# Patient Record
Sex: Male | Born: 1950
Health system: Southern US, Community
[De-identification: ages and names within clinical notes are randomized; demographics above are authoritative.]

## PROBLEM LIST (undated history)

## (undated) DIAGNOSIS — N200 Calculus of kidney: Secondary | ICD-10-CM

## (undated) DIAGNOSIS — G459 Transient cerebral ischemic attack, unspecified: Secondary | ICD-10-CM

## (undated) DIAGNOSIS — E669 Obesity, unspecified: Secondary | ICD-10-CM

## (undated) DIAGNOSIS — R000 Tachycardia, unspecified: Secondary | ICD-10-CM

## (undated) DIAGNOSIS — I10 Essential (primary) hypertension: Secondary | ICD-10-CM

## (undated) DIAGNOSIS — E785 Hyperlipidemia, unspecified: Secondary | ICD-10-CM

## (undated) DIAGNOSIS — E119 Type 2 diabetes mellitus without complications: Secondary | ICD-10-CM

## (undated) DIAGNOSIS — I509 Heart failure, unspecified: Secondary | ICD-10-CM

## (undated) DIAGNOSIS — M1990 Unspecified osteoarthritis, unspecified site: Secondary | ICD-10-CM

## (undated) DIAGNOSIS — J449 Chronic obstructive pulmonary disease, unspecified: Secondary | ICD-10-CM

## (undated) HISTORY — DX: Hyperlipidemia, unspecified: E78.5

## (undated) HISTORY — DX: Obesity, unspecified: E66.9

## (undated) HISTORY — PX: KIDNEY STONE SURGERY: SHX686

## (undated) HISTORY — DX: Type 2 diabetes mellitus without complications: E11.9

## (undated) HISTORY — DX: Transient cerebral ischemic attack, unspecified: G45.9

## (undated) HISTORY — DX: Chronic obstructive pulmonary disease, unspecified: J44.9

## (undated) HISTORY — DX: Heart failure, unspecified: I50.9

---
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
## 2009-03-04 ENCOUNTER — Ambulatory Visit: Payer: Self-pay

## 2010-07-05 ENCOUNTER — Emergency Department: Payer: Self-pay | Admitting: Emergency Medicine

## 2011-11-08 ENCOUNTER — Emergency Department: Payer: Self-pay | Admitting: Emergency Medicine

## 2011-11-11 ENCOUNTER — Inpatient Hospital Stay: Payer: Self-pay | Admitting: Internal Medicine

## 2013-05-12 LAB — BASIC METABOLIC PANEL
BUN: 18 mg/dL (ref 7–18)
Calcium, Total: 9.4 mg/dL (ref 8.5–10.1)
Chloride: 100 mmol/L (ref 98–107)
Co2: 25 mmol/L (ref 21–32)
EGFR (African American): 57 — ABNORMAL LOW
EGFR (Non-African Amer.): 49 — ABNORMAL LOW
Osmolality: 268 (ref 275–301)
Potassium: 3.9 mmol/L (ref 3.5–5.1)
Sodium: 132 mmol/L — ABNORMAL LOW (ref 136–145)

## 2013-05-12 LAB — URINALYSIS, COMPLETE
Glucose,UR: NEGATIVE mg/dL (ref 0–75)
Nitrite: NEGATIVE
Protein: 100
RBC,UR: 134 /HPF (ref 0–5)
Specific Gravity: 1.016 (ref 1.003–1.030)
Squamous Epithelial: 1

## 2013-05-12 LAB — CBC
HCT: 46.5 % (ref 40.0–52.0)
HGB: 16.2 g/dL (ref 13.0–18.0)
MCHC: 34.8 g/dL (ref 32.0–36.0)
MCV: 91 fL (ref 80–100)
Platelet: 229 10*3/uL (ref 150–440)
RDW: 13.8 % (ref 11.5–14.5)
WBC: 24.4 10*3/uL — ABNORMAL HIGH (ref 3.8–10.6)

## 2013-05-13 ENCOUNTER — Inpatient Hospital Stay: Payer: Self-pay | Admitting: Urology

## 2013-05-13 LAB — CBC WITH DIFFERENTIAL/PLATELET
Basophil #: 0.1 10*3/uL (ref 0.0–0.1)
Basophil %: 0.3 %
Eosinophil %: 0.1 %
HCT: 44.2 % (ref 40.0–52.0)
HGB: 14.9 g/dL (ref 13.0–18.0)
Lymphocyte #: 0.7 10*3/uL — ABNORMAL LOW (ref 1.0–3.6)
MCH: 31.4 pg (ref 26.0–34.0)
MCHC: 33.7 g/dL (ref 32.0–36.0)
Monocyte #: 1.3 x10 3/mm — ABNORMAL HIGH (ref 0.2–1.0)
Monocyte %: 6.7 %
Neutrophil #: 17.8 10*3/uL — ABNORMAL HIGH (ref 1.4–6.5)
Platelet: 185 10*3/uL (ref 150–440)
RBC: 4.75 10*6/uL (ref 4.40–5.90)
WBC: 20 10*3/uL — ABNORMAL HIGH (ref 3.8–10.6)

## 2013-05-13 LAB — BASIC METABOLIC PANEL
Anion Gap: 8 (ref 7–16)
BUN: 20 mg/dL — ABNORMAL HIGH (ref 7–18)
Calcium, Total: 8.5 mg/dL (ref 8.5–10.1)
Chloride: 106 mmol/L (ref 98–107)
Co2: 25 mmol/L (ref 21–32)
EGFR (African American): >60
EGFR (Non-African Amer.): 54 — ABNORMAL LOW
Osmolality: 281 (ref 275–301)
Sodium: 139 mmol/L (ref 136–145)

## 2013-05-14 LAB — CBC WITH DIFFERENTIAL/PLATELET
Eosinophil #: 0 10*3/uL (ref 0.0–0.7)
Eosinophil %: 0.1 %
HCT: 39.8 % — ABNORMAL LOW (ref 40.0–52.0)
MCH: 31.6 pg (ref 26.0–34.0)
MCV: 92 fL (ref 80–100)
Monocyte %: 12 %
Neutrophil #: 10.5 10*3/uL — ABNORMAL HIGH (ref 1.4–6.5)
Neutrophil %: 81.8 %
RBC: 4.32 10*6/uL — ABNORMAL LOW (ref 4.40–5.90)
WBC: 12.8 10*3/uL — ABNORMAL HIGH (ref 3.8–10.6)

## 2013-05-14 LAB — BASIC METABOLIC PANEL
BUN: 23 mg/dL — ABNORMAL HIGH (ref 7–18)
Calcium, Total: 8.3 mg/dL — ABNORMAL LOW (ref 8.5–10.1)
Co2: 23 mmol/L (ref 21–32)
Creatinine: 1.56 mg/dL — ABNORMAL HIGH (ref 0.60–1.30)
Glucose: 104 mg/dL — ABNORMAL HIGH (ref 65–99)
Osmolality: 274 (ref 275–301)

## 2013-05-14 LAB — URINE CULTURE

## 2013-05-19 ENCOUNTER — Ambulatory Visit: Payer: Self-pay | Admitting: Urology

## 2013-05-23 LAB — URINE CULTURE

## 2013-05-26 ENCOUNTER — Ambulatory Visit: Payer: Self-pay | Admitting: Urology

## 2013-06-09 DIAGNOSIS — T191XXA Foreign body in bladder, initial encounter: Secondary | ICD-10-CM | POA: Insufficient documentation

## 2013-06-09 DIAGNOSIS — T190XXA Foreign body in urethra, initial encounter: Secondary | ICD-10-CM | POA: Insufficient documentation

## 2013-06-09 DIAGNOSIS — N200 Calculus of kidney: Secondary | ICD-10-CM | POA: Insufficient documentation

## 2013-06-09 DIAGNOSIS — N201 Calculus of ureter: Secondary | ICD-10-CM | POA: Insufficient documentation

## 2013-06-10 ENCOUNTER — Observation Stay: Payer: Self-pay | Admitting: Internal Medicine

## 2013-06-10 DIAGNOSIS — G459 Transient cerebral ischemic attack, unspecified: Secondary | ICD-10-CM

## 2013-06-10 LAB — DRUG SCREEN, URINE
Amphetamines, Ur Screen: NEGATIVE (ref ?–1000)
Benzodiazepine, Ur Scrn: NEGATIVE (ref ?–200)
Cocaine Metabolite,Ur ~~LOC~~: NEGATIVE (ref ?–300)
MDMA (Ecstasy)Ur Screen: NEGATIVE (ref ?–500)
Phencyclidine (PCP) Ur S: NEGATIVE (ref ?–25)
Tricyclic, Ur Screen: NEGATIVE (ref ?–1000)

## 2013-06-10 LAB — COMPREHENSIVE METABOLIC PANEL
Alkaline Phosphatase: 101 U/L (ref 50–136)
BUN: 18 mg/dL (ref 7–18)
Bilirubin,Total: 0.4 mg/dL (ref 0.2–1.0)
Calcium, Total: 9.1 mg/dL (ref 8.5–10.1)
Co2: 27 mmol/L (ref 21–32)
Creatinine: 1.15 mg/dL (ref 0.60–1.30)
EGFR (African American): >60
Glucose: 107 mg/dL — ABNORMAL HIGH (ref 65–99)
Osmolality: 285 (ref 275–301)
Potassium: 3.9 mmol/L (ref 3.5–5.1)
SGPT (ALT): 25 U/L (ref 12–78)
Sodium: 142 mmol/L (ref 136–145)

## 2013-06-10 LAB — URINALYSIS, COMPLETE
Bacteria: NONE SEEN
Ketone: NEGATIVE
Leukocyte Esterase: NEGATIVE
Nitrite: NEGATIVE
Protein: NEGATIVE
Specific Gravity: 1.019 (ref 1.003–1.030)
Squamous Epithelial: 1

## 2013-06-10 LAB — CBC WITH DIFFERENTIAL/PLATELET
Eosinophil #: 0.1 10*3/uL (ref 0.0–0.7)
HGB: 15.4 g/dL (ref 13.0–18.0)
Lymphocyte #: 1.9 10*3/uL (ref 1.0–3.6)
MCH: 31.4 pg (ref 26.0–34.0)
MCHC: 34.3 g/dL (ref 32.0–36.0)
MCV: 92 fL (ref 80–100)
Monocyte #: 0.7 x10 3/mm (ref 0.2–1.0)
Monocyte %: 6.4 %
Neutrophil #: 7.5 10*3/uL — ABNORMAL HIGH (ref 1.4–6.5)
Platelet: 241 10*3/uL (ref 150–440)
WBC: 10.2 10*3/uL (ref 3.8–10.6)

## 2013-06-10 LAB — PROTIME-INR
INR: 1
Prothrombin Time: 13.3 secs (ref 11.5–14.7)

## 2013-06-10 LAB — APTT: Activated PTT: 31.2 secs (ref 23.6–35.9)

## 2013-06-10 LAB — TROPONIN I
Troponin-I: <0.02 ng/mL
Troponin-I: <0.02 ng/mL

## 2013-06-11 LAB — BASIC METABOLIC PANEL
BUN: 18 mg/dL (ref 7–18)
Calcium, Total: 9.1 mg/dL (ref 8.5–10.1)
Chloride: 107 mmol/L (ref 98–107)
Creatinine: 0.87 mg/dL (ref 0.60–1.30)
EGFR (African American): >60
EGFR (Non-African Amer.): >60
Glucose: 97 mg/dL (ref 65–99)
Sodium: 140 mmol/L (ref 136–145)

## 2013-06-11 LAB — CBC WITH DIFFERENTIAL/PLATELET
Basophil %: 1.1 %
Eosinophil #: 0.3 10*3/uL (ref 0.0–0.7)
Eosinophil %: 3.9 %
Lymphocyte %: 33.2 %
MCH: 32 pg (ref 26.0–34.0)
MCV: 92 fL (ref 80–100)
Monocyte %: 8.9 %
Neutrophil #: 4 10*3/uL (ref 1.4–6.5)
Platelet: 212 10*3/uL (ref 150–440)
RBC: 4.57 10*6/uL (ref 4.40–5.90)
RDW: 14.4 % (ref 11.5–14.5)
WBC: 7.6 10*3/uL (ref 3.8–10.6)

## 2013-06-11 LAB — LIPID PANEL
HDL Cholesterol: 40 mg/dL (ref 40–60)
Triglycerides: 135 mg/dL (ref 0–200)
VLDL Cholesterol, Calc: 27 mg/dL (ref 5–40)

## 2013-06-11 LAB — TROPONIN I: Troponin-I: <0.02 ng/mL

## 2013-06-11 LAB — HEMOGLOBIN A1C: Hemoglobin A1C: 6.7 % — ABNORMAL HIGH (ref 4.2–6.3)

## 2013-06-17 ENCOUNTER — Other Ambulatory Visit: Payer: Self-pay | Admitting: Internal Medicine

## 2013-06-17 DIAGNOSIS — R531 Weakness: Secondary | ICD-10-CM

## 2013-06-17 DIAGNOSIS — I639 Cerebral infarction, unspecified: Secondary | ICD-10-CM

## 2013-06-21 ENCOUNTER — Ambulatory Visit
Admission: RE | Admit: 2013-06-21 | Discharge: 2013-06-21 | Disposition: A | Discharge status: Home / Self Care | Payer: BC Managed Care – PPO | Source: Ambulatory Visit | Attending: Internal Medicine | Admitting: Internal Medicine

## 2013-06-21 DIAGNOSIS — I639 Cerebral infarction, unspecified: Secondary | ICD-10-CM

## 2013-06-21 DIAGNOSIS — R531 Weakness: Secondary | ICD-10-CM

## 2013-09-17 ENCOUNTER — Ambulatory Visit: Payer: Self-pay | Admitting: Physician Assistant

## 2015-04-01 NOTE — H&P (Signed)
PATIENT NAME:  Thomas Tanner, Thomas Tanner MR#:  [id number] DATE OF BIRTH:  24-Mar-1951  DATE OF ADMISSION:  06/10/2013  REFERRING PHYSICIAN: Dr. Carollee Massed.   PRIMARY CARE PHYSICIAN: None.   HISTORY OF PRESENT ILLNESS:  The patient is a very nice 64 year old gentleman who has history of obesity and chronic smoking. He has been hospitalized in the past due to cellulitis of the lower extremities; at this moment, he comes with a history of two episodes of facial droop and right-sided weakness. The 2 episodes happened this morning. He woke up with a headache. 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 He was working, it was really hot and he started having some facial droop of the left side with left-sided numbness of the face, right-sided numbness of the right upper extremity that progressed down to the leg.   The episode lasted for 5 minutes. The patient felt really dizzy, lightheaded and had a slight blurry vision for what he went to the break room, and after a while it happened again.  Lasted again for 5 minutes, same symptoms. The patient has not had breakfast at that point, but he does not have breakfast in the mornings; he will usually wait until lunch to eat. He had a kidney stone removed 2 weeks ago, a stent was placed and the stent was recently removed yesterday. The patient states that he has been drinking a normal amount of fluids today. Yesterday, he might of been a little  bit dehydrated due to the procedure and not having anything per mouth.   The patient is admitted for evaluation of these symptoms.   REVIEW OF SYSTEMS:  A 12-system review of systems is done and all negative except for mentioned above.   CONSTITUTIONAL: No changes  in weight. No significant weakness, fatigue or fever prior to this episode.   EYES: No blurry vision prior to today. No double vision. No pain or redness or inflammation.   ENT: No tinnitus. No hearing loss. No difficulty  swallowing. No postnasal drip.   RESPIRATORY: No significant hemoptysis. Positive morning cough from smoking and occasional wheezing when he gets sick, but not on a daily basis.  He denies any history of being diagnosed with chronic obstructive pulmonary disease or having pneumonia.   CARDIOVASCULAR: No chest pain, orthopnea, edema, syncope or prior palpitations.   GASTROINTESTINAL: No nausea, vomiting, abdominal pain, constipation or diarrhea.   GENITOURINARY: No dysuria, hematuria or changes in frequency is kidney stone 2 weeks ago stent place and removed yesterday. No prostatitis.   ENDOCRINE: No polyuria, polydipsia, polyphagia, cold or heat intolerance.   HEMATOLOGIC AND LYMPHATIC: No anemia, easy bruising or swollen glands.   SKIN: No rashes or petechiae at this moment. No new lesions or moles.   NEUROLOGIC: No numbness or tingling at this moment. No previous CVAs or transient ischemic attacks in the past. See history of present illness for more detail.   PSYCHIATRIC: No insomnia, schizophrenia. No nervousness.   PAST MEDICAL HISTORY: 1. Morbid obesity.  2. Current tobacco use.  3. History of previous cellulitis.   PAST SURGICAL HISTORY: The bilateral elbow surgery and kidney stone removal.  He had cystoscopy with stent placement 2 weeks ago.   SOCIAL HISTORY: The patient is a smoker.  He smokes 1 to 1-1/2 packs of cigarettes per day. He has been smoking for over 40 years. He does not drink.  He does not use marijuana. Lives with wife, mother-in-law,  grandmother-in-law and granddaughter.  The patient is married. He works as a Psychologist, educational at [employer].   FAMILY HISTORY: Positive for history of thrombophlebitis in his grandmother, but she states no heart attacks. No strokes.  No diabetes or hypertension runs in his family. No cancer.   MEDICATIONS: Currently takes [facility] powders occasionally for headache.   ALLERGIES: No known drug allergies.   PHYSICAL EXAMINATION: VITAL SIGNS:  Blood pressure of 149/85, pulse 96, respirations 18, temperature of 98.2, heart rate on admission was 106, respirations were 24.   GENERAL: The patient is alert, oriented x 3. No acute distress. No respiratory distress. Hemodynamically stable.   HEENT: Pupils are equal and reactive.  Extraocular movements are intact. Mucosa are moist. Anicteric sclerae. Pink conjunctivae. No oral lesions. No oropharyngeal exudates. Normocephalic, atraumatic.   NECK: Supple. No JVD. No thyromegaly. No adenopathy. No carotid bruits.  Trachea is midline. No significant rigidity.   CARDIOVASCULAR: Regular rate and rhythm. Distant sounds. No murmurs, rubs or gallops. No tenderness to palpation anterior chest wall. No displacement of PMI.   LUNGS: Clear without any wheezing or crepitus. The patient has decreased respiratory sounds in both bases all throughout due to body habitus. No use of accessory muscles.   ABDOMEN: Soft, nontender, nondistended. No hepatosplenomegaly. No masses. Bowel sounds are positive.   SKIN: Without any rashes or petechiae.   MUSCULOSKELETAL: No significant joint swelling or joint deformity.   EXTREMITIES: No edema, cyanosis or clubbing. Pulses +2. Capillary refill less than 3. Actually, the patient may have a little bit of trace edema at the level of the ankles, but no significant pitting edema.   PSYCHIATRIC: The patient is alert, oriented x 3. Normal affect.   LYMPHATICS: Negative for lymphadenopathy in neck or supraclavicular area.   VASCULAR: Pulses +2. Capillary refill less than 3. Sensation is normal distally.   NEUROLOGIC: Cranial nerves II through XII intact. Strength is 5/5 in all four extremities. Cerebellar test overall normal. No significant focal findings.   LABORATORY DATA:  Pretty much nonrevealing. Glucose is 107, sodium 142, potassium 3.9, chloride slightly elevated at 109. LFTs within normal limits. Troponin is negative x 1. White count of 10.2, hemoglobin 15.4,  platelet count 241. Urinalysis; 2 white blood cells, 2 red blood cells and no nitrates and no leukocyte esterase.   EKG: Normal sinus rhythm, nonspecific pressure or elevation, slightly tachycardic.   ASSESSMENT AND PLAN: A 64 year old gentleman obese, comes with symptoms related to a possible transient ischemic attack, possible cerebral vascular accident.  1. Transient ischemic attack versus cerebral vascular accident. At this moment, clinically it does not seem like the patient has a full stroke.  It seems more like a temporary ischemic attack. The patient has resolution of the symptoms. It happened twice today, lasting for five minutes. Again, the patient had full resolution of symptoms for now, we are going to keep him in observation. Neuro checks, bedrest for today. Allow permissive hypertension as the patient has elevation of his blood pressure anyway. Statin and aspirin added on to the management.  Hemoglobin A1c, TSH, and lipid profile add on as well, and we are going to keep him observed under telemetry. Echocardiogram is going to be done to evaluate for any possiblevalvulopathy .  Ultrasound of the carotid arteries is going to be done as the patient to have significant vascular disease, since he is a heavy smoker.  2. Smoking cessation education has been given to the patient for over 5 minutes. The patient states that  he is not quite ready to quit, but he is thinking about it.  3. Hypertension. The patient has elevation of blood pressure on previous hospitalizations and now on this one.  He does admit to the hospital. We are going to monitor closely. By discharge, he should be able to take a blood pressure medication for what we might prescribe 1 hydrochlorothiazide will be a good choice for him.   The patient is a full code.  Gastrointestinal prophylaxis with past deep vein thrombosis prophylaxis with heparin a small dose.  TIME SPENT:  I spent about 45 minutes with this patient.     ____________________________ Felipa Furnaceoberto Sanchez Gutierrez, MD rsg:rw D: 06/10/2013 14:26:17 ET T: 06/10/2013 15:56:05 ET JOB#: [id number]  cc: Felipa Furnaceoberto Sanchez Gutierrez, MD, <Dictator> Bryer Cozzolino Juanda ChanceSANCHEZ GUTIERRE MD ELECTRONICALLY SIGNED 06/20/2013 16:32

## 2015-04-01 NOTE — Discharge Summary (Signed)
PATIENT NAME:  Thomas Tanner, Thomas Tanner MR#:  [id number] DATE OF BIRTH:  23-Mar-1951  DATE OF ADMISSION:  06/10/2013  DATE OF DISCHARGE:  06/11/2013  ADMITTING DIAGNOSIS:  Transient ischemic attack.  DISCHARGE DIAGNOSES:  1.  Recurrent transient ischemic attack, with speech disturbance as well as right-sided weakness, resolved.  2.  Hyperlipidemia, with LDL 133. 3.  Hypertension. 4.  Diabetes mellitus, new onset.  5.  Obesity.  6.  Suspected obstructive sleep apnea as well as ongoing tobacco abuse.   CONDITION:  Stable.   DISCHARGE MEDICATIONS: Simvastatin 10 mg p.o. at bedtime, aspirin 325 mg p.o. once daily, famotidine 10 mg p.o. twice daily, NicoDerm CQ transdermal film 21 mg one patch transdermally once daily. Nicotrol inhaler 10 mg every 2 hours as needed.   HOME OXYGEN: None.   DIET:  2 grams salt, low fat, low cholesterol, carbohydrate-controlled diet, regular consistency.   ACTIVITY LIMITATIONS:  As tolerated.   FOLLOWUP APPOINTMENT: With [facility] in 2 days after discharge. The patient was also advised to get a sleep study done and pulmonary function tests performed as outpatient. He also was advised to follow up with [facility] as outpatient.   HISTORY:  The patient is a 64 year old, obese Caucasian male, who presents to the hospital with complaints of right-sided weakness as well as speech disturbance. Please refer to Dr. Mordecai Maes' detailed admission on 06/10/2013. On arrival to the hospital, he stated that he had a facial droop on the right as well as right-sided weakness, and had some left-sided numbness of the face, as well as right-sided numbness and right upper extremity numbness that progressed down to his leg. The episode lasted approximately 5 minutes. The patient felt dizzy, lightheaded, and had blurry vision. Then the symptoms subsided, and recurred again. On arrival to the Emergency Room, the patient's blood pressure was 149/85, pulse was 96, respiration rate was 18,  temperature was 98.2, and heart rate was 106. Physical examination was unremarkable.  LAB DATA: Done on admission, showed elevated glucose to 107, otherwise BMP was unremarkable. The patient's liver enzymes were normal. Cardiac enzymes x 3 were normal. TSH was normal at 0.92. Urine drug screen was negative for methadone, (Dictation Anomaly) phencyclidine,  tricyclic antidepressants, as well as (Dictation Anomaly) MDMA Otherwise not checked. CBC was within normal limits. Coagulation panel was unremarkable. Urinalysis revealed 2 red blood cells as well as 2 white blood cells. Urine culture showed no growth. EKG showed sinus tachy at 147, otherwise normal EKG. No significant changes were found.   HISTORY: The patient was admitted to the hospital for further evaluation, and stroke/TIA workup was initiated. The patient had radiologic studies done. Chest x-ray done on 06/10/2013 showed no evidence of atelectasis or pneumonia or other acute cardiopulmonary abnormality. The patient had CT scan also on admission of his head without contrast 06/10/2013, which showed no acute intracranial process. Ultrasound of carotid arteries done on 06/11/2013 revealed no evidence of hemodynamically significant stenosis in extracranial carotid arteries. Large nodule in left lobe of thyroid gland incompletely visualized was noted. Dedicated thyroid ultrasound was suggested; however, not performed. Echocardiogram done on 06/11/2013 revealed left ventricular ejection fraction by visual estimation 55% to 60%, normal global left ventricular systolic function, mild concentric left ventricular hypertrophy, mild mitral annular dilatation, mildly increased left ventricular internal cavity size, aortic root dilatation of the aortic root, moderately increased left ventricular posterior wall thickness was also noted.    TIA. It was felt that patient has TIA. His symptoms resolved. However, we were  not able to perform MRI of his brain. The patient  was recommended to continue aspirin therapy. He also had his lipid panel checked, was found to have LDL of 133. He was initiated on a statin. He was recommended to continue a low fat, low cholesterol diet, and lose weight if possible, and follow up his hyperlipidemia profile, targeting his LDL below 100, preferably below 70. In regards to hypertension, initially in the hospital, since patient's blood pressure was mildly elevated, he was not initiated on new medications.   On the day of discharge, his blood pressure was fluctuating between 132 systolic to 152, and diastolic 50s to 80s. We did not initiate blood pressure medications. However, we recommended to follow up patient's blood pressure readings as outpatient, and make decisions about initiation of blood pressure medications if needed.   In regards to hyperglycemia, patient was noted to be hypoglycemic He had a hemoglobin A1c checked, which was found to be 6.7. The patient was consulted by [facility] and patient nurses, and he was recommended also lifestyle outpatient followup. The patient was advised to continue a diabetic diet, and dietitian consultation was also requested. Weight loss was also recommended. We did not initiate any medication at this point, as we felt that patient should try to control his hyperglycemia, his diet as well as lifestyle changes. In regards to obesity, again, we recommended patient to lose weight. We checked his lipid panel and, as mentioned above, patient's LDL was found to be elevated at 133, his triglycerides were 135, and HDL was 40.  Total cholesterol level was 200. The patient was advised to lose weight, as mentioned above, and statin was initiated.   The patient was explained that he likely has obstructive sleep apnea. Sleep study:  (Dictation Anomaly) should be performed as patient to conferm this. Tobacco abuse:  We consulted him about tobacco risks and tobacco replacement therapy was initiated. The patient is being  discharged in stable condition with above-mentioned medications and follow-up. His vital signs on day of discharge: Temperature 97.8, pulse 70s, respiration rate was 18 to 20s, blood pressure 134/60, saturation was 95% to 97% on 2 liters of oxygen per nasal cannula. He remained stable on room air at rest.   TIME SPENT: 40 minutes.    ____________________________ Katharina Caperima Sonali Wivell, MD rv:mr D: 06/12/2013 19:52:50 ET T: 06/12/2013 22:03:39 ET JOB#: [id number]  cc: Katharina Caperima Rayya Yagi, MD, <Dictator> [facility]  Mose Colaizzi MD ELECTRONICALLY SIGNED 06/24/2013 6:57

## 2015-04-01 NOTE — Discharge Summary (Signed)
PATIENT NAME:  [name], Thomas Tanner#:  [id number] DATE OF BIRTH:  Apr 05, 1951  DATE OF ADMISSION:  05/13/2013 DATE OF DISCHARGE:  05/14/2013  PRINCIPAL DIAGNOSES:  1.  Left ureterolithiasis.  2.  Hydronephrosis.  3.  Sepsis.   PROCEDURES: Cystoscopy and left double-J ureteral stent placement.   HOSPITAL COURSE: Tanner. Adah SalvageLassiter presented to the Emergency Room on 05/12/2013 with sudden onset left-sided flank pain. He was found to have a temperature of 102 with significant elevated white blood cell count. He had symptoms consistent with sepsis. He was taken to the operating room early morning of 05/13/2013, at which time he underwent cystoscopy and left  double-J ureteral stent placement. There were no intraoperative problems or complications. His postoperative course was significant for continued spiking fever for approximately 36 hours. His pain was well controlled on oral pain medication. Vital signs were otherwise stable. He does have some baseline COPD, although the patient denies any significant health history. He underwent albuterol nebulizer treatment with improvement in his pulmonary status. He was tolerating a general diet without difficulty. He was also ambulating without difficulty. He maintained good urine output. He was continued on IV antibiotic therapy. His urine cultures demonstrated E. coli. He continued to improve and was discharged on 05/14/2013 on oral Cipro 500 mg for a 10 day course. He was also discharged on Percocet 1 to 2 every 4 to 6 hours as needed for pain. He will be scheduled for left ureteroscopy with holmium laser lithotripsy in the near future. He is to notify us if there are any further problems or questions in the interim.   ____________________________ Madolyn FriezeBrian S. Achilles Dunkope, MD bsc:aw D: 05/15/2013 08:12:50 ET T: 05/15/2013 08:30:03 ET JOB#: [id number]  cc: Madolyn FriezeBrian S. Achilles Dunkope, MD, <Dictator> Madolyn FriezeBRIAN S Dennis Killilea MD ELECTRONICALLY SIGNED 05/26/2013 8:11

## 2015-04-01 NOTE — Op Note (Signed)
PATIENT NAME:  Thomas Tanner, Thomas Tanner MR#:  [id number] DATE OF BIRTH:  19-Oct-1951  DATE OF PROCEDURE:  05/26/2013  PRINCIPAL DIAGNOSIS: Left ureterolithiasis.   POSTOPERATIVE DIAGNOSIS: Left ureterolithiasis.   PROCEDURE PERFORMED: Left ureteroscopy with holmium laser lithotripsy.  Left upper ureteral stent placement.   SURGEON: Edrick Oh.   ANESTHESIA: Laryngeal mask airway anesthesia.   INDICATIONS: The patient is a 64 year old gentleman who presented recently with a 9 mm distal left ureteral calculus with obstruction and hydronephrosis and sepsis. He underwent stent placement. He presents for ureteroscopic stone removal.   DESCRIPTION OF PROCEDURE: After informed consent was obtained, the patient was taken to the Operating Room and placed in the dorsal lithotomy position under laryngeal mask airway anesthesia. The patient was then prepped and draped in the usual standard fashion. The 50 French rigid cystoscope was introduced into the urethra under direct vision with no urethral abnormalities noted. The prostate fossa was noted to be relatively short with moderate bilobar hypertrophy with only partial visual obstruction. Upon entering the bladder, the mucosa was inspected in its entirety. A stent was noted protruding from the left ureteral orifice with mild  inflammatory change surrounding the left ureteral orifice. No other abnormalities were appreciated. The stent was grasped utilizing grasping forceps and removed without difficulty. A guidewire was advanced into the ureteral orifice. Resistance was met at the level of the stone. The cystoscope was removed. The 6 French rigid ureteroscope was advanced into the urinary bladder and was advanced into the left ureteral orifice. A guidewire was then easily advanced alongside of the stone under direct vision into the upper pole collecting system. The guidewire was left in place. The ureteroscope was removed and replaced into the ureter. The holmium laser  fiber was then utilized to fragment the stone into multiple smaller pieces. The stone was noted to be very hard requiring significant energy for fragmentation. The pieces were basket extracted incrementally as the fragmentation occurred. The stone was noted to be adherent to the wall of the ureter with significant inflammation present. Once the bulk of the stone was removed, the scope was advanced into a more proximal portion of the ureter. Several small stone fragments were identified and basket extracted. The guidewire was in proper position. The scope was advanced to the level of the crossing vessels due to the laryngeal mask airway anesthesia. The scope could not be advanced any further. The ureteroscope was removed. The cystoscope was replaced, back loaded over the guidewire. The decision was made to replace the stent due to the degree of stone impaction and inflammation, present. A 6 French x 26 cm double-J ureteral stent was advanced over the guidewire without difficulty into the upper pole collecting system. Adequate curl was noted within the renal pelvis. Adequate curl was also noted within the urinary bladder. The bladder was irrigated free of remaining stone fragments. These were collected and will be sent for stone analysis. The bladder was then drained. The cystoscope was removed. The patient was returned to the supine position and awakened from laryngeal mask airway anesthesia. He was taken to the recovery room in stable condition. There were no problems or complications. The patient tolerated the procedure well.   ____________________________ Denice Bors. Jacqlyn Larsen, MD bsc:rw D: 05/26/2013 16:13:12 ET T: 05/26/2013 17:27:39 ET JOB#: [id number]  cc: Denice Bors. Jacqlyn Larsen, MD, <Dictator> Denice Bors Aleria Maheu MD ELECTRONICALLY SIGNED 05/29/2013 13:22

## 2015-04-01 NOTE — H&P (Signed)
PATIENT NAME:  Thomas Tanner, Thomas Tanner MR#:  [id number] DATE OF BIRTH:  06-17-1951  DATE OF ADMISSION:  05/13/2013  HISTORY: Thomas Tanner is a 64 year old gentleman with a history of nephrolithiasis. He passed a single stone spontaneously approximately 20 years prior. He has noted the onset of left-sided flank pain for several days prior to presentation. He presented to the Emergency Room with worsening left flank pain and discomfort. He was found to have an elevated white blood cell count around 24,000 with a temperature to 101.6. A CT scan was obtained demonstrating a 9 mm distal left ureteral calculus with partial obstruction. The findings are consistent with sepsis. The decision was made to proceed with emergent cystoscopy and stent placement due to the impending sepsis. He denies any other significant prior urological history. He denies any other significant health history except for a previous hospitalization for cellulitis.   PAST MEDICAL HISTORY: Significant for cellulitis and nephrolithiasis as stated above.   PAST SURGICAL HISTORY: Noncontributory.   SOCIAL HISTORY: Noncontributory.   MEDICATIONS ON ADMISSION: None.   ALLERGIES: MORPHINE. This is more of an tolerance. He does not like the side effects of the medication.   PHYSICAL EXAMINATION:  VITAL SIGNS: Temperature 101.6. Vital signs are otherwise stable.  HEENT: Within normal limits.  CHEST: Clear to auscultation bilaterally.  CARDIOVASCULAR: Regular rate and rhythm.  ABDOMEN: Soft, nondistended. Mild-to-moderate tenderness in the left lower quadrant and left flank region. External genitalia within normal limits.  EXTREMITIES: Free range of motion x 4.  NEUROLOGIC: Motor and sensory grossly intact.   ASSESSMENT: Left ureterolithiasis with obstruction and sepsis.  PLAN: The patient will be taken to the operating room on an emergent basis for cystoscopy and stent placement. Risks and benefits have been discussed in detail. Future  treatment will be indicated for stone removal, likely with ureteroscopy and holmium laser lithotripsy. These aspects have been discussed with Thomas Tanner and his wife.   ____________________________ Madolyn FriezeBrian S. Achilles Dunkope, MD bsc:aw Tanner: 05/13/2013 08:20:04 ET T: 05/13/2013 08:50:16 ET JOB#: [id number]  cc: Madolyn FriezeBrian S. Achilles Dunkope, MD, <Dictator> Madolyn FriezeBRIAN S Aydn Ferrara MD ELECTRONICALLY SIGNED 05/13/2013 13:34

## 2015-04-01 NOTE — Op Note (Signed)
PATIENT NAME:  Thomas Tanner, Thomas Tanner MR#:  [id number] DATE OF BIRTH:  23-Jun-1951  DATE OF PROCEDURE:  05/13/2013  PRINCIPAL DIAGNOSES:  1. Left ureterolithiasis.  2. Hydronephrosis.  3. Sepsis.   POSTOPERATIVE DIAGNOSES: 1. Left ureterolithiasis.  2. Hydronephrosis.  3. Sepsis.    PROCEDURE: Cystoscopy, left double-J ureteral stent placement.   SURGEON: Denice Bors. Jacqlyn Larsen, MD   ANESTHESIA: Laryngeal mask airway anesthesia.   INDICATIONS: The patient is a 64 year old gentleman who presented with several-day onset of left-sided flank pain. He was found to have a 9 mm distal left ureteral calculus with obstruction. He had significant elevated white blood cell count and fever consistent with sepsis. He was taken to the operating room for emergent stent placement.   DESCRIPTION OF PROCEDURE: After informed consent was obtained, the patient was taken to the operating room and placed in the dorsal lithotomy position under laryngeal mask airway anesthesia. The patient was then prepped and draped in the usual standard fashion. An attempt was made at passing the 23 French rigid cystoscope. Slight meatal narrowing was encountered. The meatus was dilated to 24 [place] utilizing metal sounds. The 63 French rigid cystoscope was then placed without further difficulty. No other significant urethral abnormalities were appreciated. Upon entering the prostatic fossa, minimal to moderate bilobar hypertrophy was noted with partial visual obstruction. The prostatic fossa was noted to be relatively short. Upon entering the bladder, the mucosa was inspected in its entirety, with no gross mucosal lesions noted. Bilateral ureteral orifices were well visualized, with no lesions noted. A flexible tip Glidewire was introduced into the left ureteral orifice. Multiple attempts were made at passing the guidewire into the upper pole collecting system. Resistance was met at the level of the stone. The guidewire was unable to be passed.  The cystoscope was then removed. The 6 French rigid ureteroscope was advanced into the urinary bladder. It was advanced into the left ureter without difficulty. The stone was visualized just inside of the opening. The tip of the scope was utilized to manipulate the stone slightly to one side. The guidewire was able to be passed along the side of the stone into the upper pole collecting system without further difficulty. The ureteroscope was removed. The cystoscope was replaced, back-loaded over the guidewire. A 6 French x 26 cm double-J ureteral stent was then advanced over the guidewire with slight resistance at the level of the stone. It was advanced into the upper pole collecting system. Adequate curl was noted within the renal pelvis. Adequate curl was also noted within the urinary bladder. Only mild turgid urine drainage was noted. The bladder was drained. The cystoscope was removed. The patient was returned to the supine position and awakened from laryngeal mask airway anesthesia. He was taken to the recovery room in stable condition. There were no problems or complications. The patient tolerated the procedure well.    ____________________________ Denice Bors. Jacqlyn Larsen, MD bsc:OSi D: 05/13/2013 08:17:23 ET T: 05/13/2013 09:34:24 ET JOB#: [id number]  cc: Denice Bors. Jacqlyn Larsen, MD, <Dictator> Denice Bors Plumer Mittelstaedt MD ELECTRONICALLY SIGNED 05/13/2013 13:34

## 2016-02-02 DIAGNOSIS — I10 Essential (primary) hypertension: Secondary | ICD-10-CM | POA: Diagnosis not present

## 2016-02-02 DIAGNOSIS — M1611 Unilateral primary osteoarthritis, right hip: Secondary | ICD-10-CM | POA: Diagnosis not present

## 2016-02-02 DIAGNOSIS — I4892 Unspecified atrial flutter: Secondary | ICD-10-CM | POA: Diagnosis not present

## 2016-02-02 DIAGNOSIS — Z0001 Encounter for general adult medical examination with abnormal findings: Secondary | ICD-10-CM | POA: Diagnosis not present

## 2016-02-02 DIAGNOSIS — R69 Illness, unspecified: Secondary | ICD-10-CM | POA: Diagnosis not present

## 2016-02-02 DIAGNOSIS — I739 Peripheral vascular disease, unspecified: Secondary | ICD-10-CM | POA: Diagnosis not present

## 2016-02-02 DIAGNOSIS — F17210 Nicotine dependence, cigarettes, uncomplicated: Secondary | ICD-10-CM | POA: Diagnosis not present

## 2016-02-08 DIAGNOSIS — I4892 Unspecified atrial flutter: Secondary | ICD-10-CM | POA: Diagnosis not present

## 2016-03-01 DIAGNOSIS — R000 Tachycardia, unspecified: Secondary | ICD-10-CM | POA: Diagnosis not present

## 2016-03-01 DIAGNOSIS — I10 Essential (primary) hypertension: Secondary | ICD-10-CM | POA: Diagnosis not present

## 2016-03-01 DIAGNOSIS — E119 Type 2 diabetes mellitus without complications: Secondary | ICD-10-CM | POA: Diagnosis not present

## 2016-03-01 DIAGNOSIS — N390 Urinary tract infection, site not specified: Secondary | ICD-10-CM | POA: Diagnosis not present

## 2016-12-07 DIAGNOSIS — E119 Type 2 diabetes mellitus without complications: Secondary | ICD-10-CM | POA: Diagnosis not present

## 2016-12-07 DIAGNOSIS — J4520 Mild intermittent asthma, uncomplicated: Secondary | ICD-10-CM | POA: Diagnosis not present

## 2016-12-07 DIAGNOSIS — I10 Essential (primary) hypertension: Secondary | ICD-10-CM | POA: Diagnosis not present

## 2016-12-07 DIAGNOSIS — R69 Illness, unspecified: Secondary | ICD-10-CM | POA: Diagnosis not present

## 2016-12-07 DIAGNOSIS — R000 Tachycardia, unspecified: Secondary | ICD-10-CM | POA: Diagnosis not present

## 2016-12-12 ENCOUNTER — Encounter: Payer: Self-pay | Admitting: Emergency Medicine

## 2016-12-12 DIAGNOSIS — F17200 Nicotine dependence, unspecified, uncomplicated: Secondary | ICD-10-CM | POA: Diagnosis not present

## 2016-12-12 DIAGNOSIS — R040 Epistaxis: Secondary | ICD-10-CM | POA: Diagnosis not present

## 2016-12-12 DIAGNOSIS — I10 Essential (primary) hypertension: Secondary | ICD-10-CM | POA: Diagnosis not present

## 2016-12-12 DIAGNOSIS — R69 Illness, unspecified: Secondary | ICD-10-CM | POA: Diagnosis not present

## 2016-12-12 NOTE — ED Triage Notes (Signed)
Patient states that he has had a nose bleed times two this evening. Bleeding controlled at this time. Patient states that he takes asa daily.

## 2016-12-13 ENCOUNTER — Emergency Department
Admission: EM | Admit: 2016-12-13 | Discharge: 2016-12-13 | Disposition: A | Discharge status: Home / Self Care | Payer: Medicare HMO | Attending: Emergency Medicine | Admitting: Emergency Medicine

## 2016-12-13 DIAGNOSIS — R040 Epistaxis: Secondary | ICD-10-CM

## 2016-12-13 HISTORY — DX: Unspecified osteoarthritis, unspecified site: M19.90

## 2016-12-13 HISTORY — DX: Tachycardia, unspecified: R00.0

## 2016-12-13 HISTORY — DX: Essential (primary) hypertension: I10

## 2016-12-13 HISTORY — DX: Calculus of kidney: N20.0

## 2016-12-13 NOTE — ED Provider Notes (Signed)
[facility] Emergency Department Provider Note    First MD Initiated Contact with Patient 12/13/16 0131     (approximate)  I have reviewed the triage vital signs and the nursing notes.   HISTORY  Chief Complaint Epistaxis    HPI Alandis Bluemel is a 66 y.o. male with bolus of chronic medical conditions presents to the emergency department with a history of nosebleed 2 tonight. Patient states both episodes spontaneously stopped on her own. No active bleeding at this time. Patient does admit to previous nosebleed states usually occurs when the weather is cold outside.   Past Medical History:  Diagnosis Date  . Arthritis   . Hypertension   . Kidney stone   . Tachycardia     There are no active problems to display for this patient.   Past Surgical History:  Procedure Laterality Date  . KIDNEY STONE SURGERY      Prior to Admission medications   Not on File    Allergies Morphine and related  No family history on file.  Social History Social History  Substance Use Topics  . Smoking status: Current Every Day Smoker  . Smokeless tobacco: Never Used  . Alcohol use No    Review of Systems Constitutional: No fever/chills Eyes: No visual changes. ENT: No sore throat.Positive for nosebleed Cardiovascular: Denies chest pain. Respiratory: Denies shortness of breath. Gastrointestinal: No abdominal pain.  No nausea, no vomiting.  No diarrhea.  No constipation. Genitourinary: Negative for dysuria. Musculoskeletal: Negative for back pain. Skin: Negative for rash. Neurological: Negative for headaches, focal weakness or numbness.  10-point ROS otherwise negative.  ____________________________________________   PHYSICAL EXAM:  VITAL SIGNS: ED Triage Vitals [12/12/16 2228]  Enc Vitals Group     BP (!) 158/78     Pulse Rate 73     Resp 18     Temp 98.1 F (36.7 C)     Temp Source Oral     SpO2 95 %     Weight (!) 358 lb  (162.4 kg)     Height 6\' 1"  (1.854 m)     Head Circumference      Peak Flow      Pain Score      Pain Loc      Pain Edu?      Excl. in GC?     Constitutional: Alert and oriented. Well appearing and in no acute distress. Eyes: Conjunctivae are normal. PERRL. EOMI. Head: Atraumatic. Ears:  Healthy appearing ear canals and TMs bilaterally Nose: Evidence of anterior nosebleed, no active bleeding at this time. Mouth/Throat: Mucous membranes are moist.  Oropharynx non-erythematous. Neck: No stridor. Cardiovascular: Normal rate, regular rhythm. Good peripheral circulation. Grossly normal heart sounds.     Procedures     INITIAL IMPRESSION / ASSESSMENT AND PLAN / ED COURSE  Pertinent labs & imaging results that were available during my care of the patient were reviewed by me and considered in my medical decision making (see chart for details).  History of physical exam consistent with an anterior epistaxis. Patient given nasal clamp for home   Clinical Course     ____________________________________________  FINAL CLINICAL IMPRESSION(S) / ED DIAGNOSES  Final diagnoses:  Anterior epistaxis     MEDICATIONS GIVEN DURING THIS VISIT:  Medications - No data to display   NEW OUTPATIENT MEDICATIONS STARTED DURING THIS VISIT:  New Prescriptions   No medications on file    Modified Medications   No medications on file  Discontinued Medications   No medications on file     Note:  This document was prepared using Dragon voice recognition software and may include unintentional dictation errors.    Darci Currentandolph N Brown, MD 12/13/16 [id number]

## 2016-12-13 NOTE — ED Notes (Signed)
Pt given nasal clamp for future nose bleeds

## 2017-03-22 DIAGNOSIS — M1711 Unilateral primary osteoarthritis, right knee: Secondary | ICD-10-CM | POA: Diagnosis not present

## 2017-09-09 ENCOUNTER — Telehealth: Payer: Self-pay

## 2017-09-09 NOTE — Telephone Encounter (Signed)
Entered in error

## 2017-12-02 ENCOUNTER — Ambulatory Visit (INDEPENDENT_AMBULATORY_CARE_PROVIDER_SITE_OTHER): Payer: Managed Care, Other (non HMO) | Admitting: Nurse Practitioner

## 2017-12-02 ENCOUNTER — Encounter: Payer: Self-pay | Admitting: Nurse Practitioner

## 2017-12-02 VITALS — BP 124/69 | HR 99 | Resp 16 | Ht 68.0 in | Wt 365.0 lb

## 2017-12-02 DIAGNOSIS — M5441 Lumbago with sciatica, right side: Secondary | ICD-10-CM

## 2017-12-02 DIAGNOSIS — J4520 Mild intermittent asthma, uncomplicated: Secondary | ICD-10-CM | POA: Diagnosis not present

## 2017-12-02 DIAGNOSIS — M25551 Pain in right hip: Secondary | ICD-10-CM | POA: Diagnosis not present

## 2017-12-02 DIAGNOSIS — Z1211 Encounter for screening for malignant neoplasm of colon: Secondary | ICD-10-CM

## 2017-12-02 DIAGNOSIS — Z0001 Encounter for general adult medical examination with abnormal findings: Secondary | ICD-10-CM

## 2017-12-02 DIAGNOSIS — G8929 Other chronic pain: Secondary | ICD-10-CM | POA: Diagnosis not present

## 2017-12-02 DIAGNOSIS — K219 Gastro-esophageal reflux disease without esophagitis: Secondary | ICD-10-CM

## 2017-12-02 DIAGNOSIS — E119 Type 2 diabetes mellitus without complications: Secondary | ICD-10-CM | POA: Diagnosis not present

## 2017-12-02 DIAGNOSIS — I10 Essential (primary) hypertension: Secondary | ICD-10-CM | POA: Diagnosis not present

## 2017-12-02 LAB — POCT GLYCOSYLATED HEMOGLOBIN (HGB A1C): Hemoglobin A1C: 5.8

## 2017-12-02 MED ORDER — TIZANIDINE HCL 4 MG PO TABS: 4.0000 mg | ORAL_TABLET | Freq: Every day | ORAL | 3 refills | Status: DC

## 2017-12-02 MED ORDER — LISINOPRIL-HYDROCHLOROTHIAZIDE 20-12.5 MG PO TABS: 1.0000 | ORAL_TABLET | Freq: Every day | ORAL | 5 refills | Status: DC

## 2017-12-02 MED ORDER — METOPROLOL SUCCINATE ER 50 MG PO TB24: 50.0000 mg | ORAL_TABLET | Freq: Every day | ORAL | 5 refills | Status: DC

## 2017-12-02 MED ORDER — MELOXICAM 7.5 MG PO TABS: 7.5000 mg | ORAL_TABLET | Freq: Every day | ORAL | 5 refills | Status: DC

## 2017-12-02 MED ORDER — VENTOLIN HFA 108 (90 BASE) MCG/ACT IN AERS
2.0000 | INHALATION_SPRAY | Freq: Four times a day (QID) | RESPIRATORY_TRACT | 5 refills | Status: DC | PRN
Start: 2017-12-02 — End: 2019-04-29

## 2017-12-02 MED ORDER — OMEPRAZOLE 40 MG PO CPDR: 40.0000 mg | DELAYED_RELEASE_CAPSULE | Freq: Every day | ORAL | 5 refills | Status: DC

## 2017-12-02 NOTE — Patient Instructions (Addendum)
Hamstring Strain Rehab Ask your health care provider which exercises are safe for you. Do exercises exactly as told by your health care provider and adjust them as directed. It is normal to feel mild stretching, pulling, tightness, or discomfort as you do these exercises, but you should stop right away if you feel sudden pain or your pain gets worse.Do not begin these exercises until told by your health care provider. Strengthening exercises These exercises build strength and endurance in your thighs. Endurance is the ability to use your muscles for a long time, even after your muscles get tired. Exercise A: Straight leg raises ( hip extensors) 1. Lie on your belly on a firm surface. 2. Tense the muscles in your buttocks to lift your left / right leg about 4 inches (10 cm). Keep your knee straight. 3. If you cannot lift your leg this high without arching your back, place a pillow under your hips. 4. Hold the position for __________ seconds. 5. Slowly lower your leg to the starting position and allow it to relax completely before you start the next repetition. Repeat __________ times. Complete this exercise __________ times a day. Exercise B: Bridge ( hip extensors) 1. Lie on your back on a firm surface with your knees bent and your feet flat on the floor. 2. Tighten your buttocks muscles and lift your bottom off the floor until your trunk is level with your thighs. ? You should feel the muscles working in your buttocks and the back of your thighs. ? Do not arch your back. 3. Hold this position for __________ seconds. 4. Slowly lower your hips to the starting position. 5. Let your buttocks muscles relax completely between repetitions. Repeat __________ times. Complete this exercise __________ times a day. If told by your health care provider, keep your bottom lifted off the floor while you slowly walk your feet away from you as far as you can control. Hold for __________ seconds, then slowly  walk your feet back toward you. Exercise C: Lateral walking with band ( hip abductors) 1. Stand in a long hallway. 2. Wrap a loop of exercise band around your legs, just above your knees. 3. Bend your knees gently and drop your hips down and back so your weight is over your heels. 4. Step to the side to move down the length of the hallway, keeping your toes pointed ahead of you and keeping tension in the band. 5. Repeat, leading with your other leg. Repeat __________ times. Complete this exercise __________ times a day. Exercise D: Single leg stand with reaching ( eccentric hamstring) 1. Stand on your left / right foot. Keep your big toe down on the floor and try to keep your arch lifted. 2. Slowly reach down toward the floor as far as you can while keeping your balance. 3. Hold this position for __________ seconds. Repeat __________ times. Complete this exercise __________ times a day. Exercise E: Prone plank ( abdominals and core) 1. Lie on your belly on the floor and prop yourself up on your elbows. Your hands should be straight out in front of you, and your elbows should be below your shoulders. Position your feet so the balls of your feet touch the ground. The ball of your foot is on the walking surface, right under your toes. 2. Tighten your abdominal muscles and lift your body off the floor. ? Do not arch your back. ? Do not hold your breath. 3. Hold this position for __________ seconds. Repeat __________  times. Complete this exercise __________ times a day. This information is not intended to replace advice given to you by your health care provider. Make sure you discuss any questions you have with your health care provider. Document Released: 11/26/2005 Document Revised: 08/02/2016 Document Reviewed: 08/25/2015 Elsevier Interactive Patient Education  2018 [employer]  Hamstring Strain Rehab Ask your health care provider which exercises are safe for you. Do exercises exactly as  told by your health care provider and adjust them as directed. It is normal to feel mild stretching, pulling, tightness, or discomfort as you do these exercises, but you should stop right away if you feel sudden pain or your pain gets worse.Do not begin these exercises until told by your health care provider. Strengthening exercises These exercises build strength and endurance in your thighs. Endurance is the ability to use your muscles for a long time, even after your muscles get tired. Exercise A: Straight leg raises ( hip extensors) 6. Lie on your belly on a firm surface. 7. Tense the muscles in your buttocks to lift your left / right leg about 4 inches (10 cm). Keep your knee straight. 8. If you cannot lift your leg this high without arching your back, place a pillow under your hips. 9. Hold the position for __________ seconds. 10. Slowly lower your leg to the starting position and allow it to relax completely before you start the next repetition. Repeat __________ times. Complete this exercise __________ times a day. Exercise B: Bridge ( hip extensors) 6. Lie on your back on a firm surface with your knees bent and your feet flat on the floor. 7. Tighten your buttocks muscles and lift your bottom off the floor until your trunk is level with your thighs. ? You should feel the muscles working in your buttocks and the back of your thighs. ? Do not arch your back. 8. Hold this position for __________ seconds. 9. Slowly lower your hips to the starting position. 10. Let your buttocks muscles relax completely between repetitions. Repeat __________ times. Complete this exercise __________ times a day. If told by your health care provider, keep your bottom lifted off the floor while you slowly walk your feet away from you as far as you can control. Hold for __________ seconds, then slowly walk your feet back toward you. Exercise C: Lateral walking with band ( hip abductors) 6. Stand in a long  hallway. 7. Wrap a loop of exercise band around your legs, just above your knees. 8. Bend your knees gently and drop your hips down and back so your weight is over your heels. 9. Step to the side to move down the length of the hallway, keeping your toes pointed ahead of you and keeping tension in the band. 10. Repeat, leading with your other leg. Repeat __________ times. Complete this exercise __________ times a day. Exercise D: Single leg stand with reaching ( eccentric hamstring) 4. Stand on your left / right foot. Keep your big toe down on the floor and try to keep your arch lifted. 5. Slowly reach down toward the floor as far as you can while keeping your balance. 6. Hold this position for __________ seconds. Repeat __________ times. Complete this exercise __________ times a day. Exercise E: Prone plank ( abdominals and core) 4. Lie on your belly on the floor and prop yourself up on your elbows. Your hands should be straight out in front of you, and your elbows should be below your shoulders. Position your feet  so the balls of your feet touch the ground. The ball of your foot is on the walking surface, right under your toes. 5. Tighten your abdominal muscles and lift your body off the floor. ? Do not arch your back. ? Do not hold your breath. 6. Hold this position for __________ seconds. Repeat __________ times. Complete this exercise __________ times a day. This information is not intended to replace advice given to you by your health care provider. Make sure you discuss any questions you have with your health care provider. Document Released: 11/26/2005 Document Revised: 08/02/2016 Document Reviewed: 08/25/2015 Elsevier Interactive Patient Education  [employer]2018 [employer]vier Inc.

## 2017-12-02 NOTE — Progress Notes (Signed)
Subjective:     Patient ID: Thomas Tanner, male   DOB: 11/19/1951, 66 y.o.   MRN: [id number]  The patient ishere for annual wellness visit today. He c/o weight gain of 13 pounds since most recent visit. Did have a few deaths of thos who are close to him. Used food as comfort. Has not been exercising. Knows this has contributing to his weight gain Also c/o lower back pain which raadiates into the right hip and around the thigh and lower leg. Will, three days out of seven, have pain which is so severe, he can hardly stand on his right lower leg. Does take meloxicam twice daily, which does take the edge off of the pain some.  Does have goodcontrol of his blood sugars without taking medications at all. We will check HgbA1c and get routine labs after today's visit . The patient does use a walker to help with balance and mobility. Has had the same one for several years and is falling into disrepair. He is in need of new walker. An order will be sent to home health provider today.      Current Outpatient Medications:  .  aspirin EC 81 MG tablet, Take 81 mg by mouth daily., Disp: , Rfl:  .  glucose blood test strip, 1 each by Other route as needed for other. Use as instructed, Disp: , Rfl:  .  lisinopril-hydrochlorothiazide (PRINZIDE,ZESTORETIC) 20-12.5 MG tablet, , Disp: , Rfl:  .  meloxicam (MOBIC) 7.5 MG tablet, , Disp: , Rfl:  .  metoprolol succinate (TOPROL-XL) 50 MG 24 hr tablet, , Disp: , Rfl:  .  omeprazole (PRILOSEC) 40 MG capsule, , Disp: , Rfl:  .  ONETOUCH DELICA LANCETS 33G MISC, by Does not apply route., Disp: , Rfl:  .  triamcinolone cream (KENALOG) 0.5 %, Apply 1 application topically 2 (two) times daily., Disp: , Rfl:  .  VENTOLIN HFA 108 (90 Base) MCG/ACT inhaler, , Disp: , Rfl:     Review of Systems  Constitutional: Positive for fatigue.       Weight gain of 13 pounds since most recent visit.   HENT: Positive for postnasal drip.   Eyes: Positive for redness and itching.   Respiratory: Positive for shortness of breath.   Cardiovascular: Negative for chest pain and palpitations.  Gastrointestinal: Negative.   Endocrine:       Controls blood sugars through diet alone.   Genitourinary: Negative.   Musculoskeletal: Positive for arthralgias and myalgias.       Pain in right aspect of lower back, radiating into right hip and upper leg, making standing for long periods of time and exercise, very difficult.   Skin: Negative.   Allergic/Immunologic: Positive for environmental allergies.  Neurological: Negative.   Hematological: Negative.   Psychiatric/Behavioral:       Feeling some depressed over the past year due to the loss of several friends/family.    Today's Vitals   12/02/17 0835  BP: 124/69  Pulse: 99  Resp: 16  SpO2: 96%  Weight: (!) 365 lb (165.6 kg)  Height: 5\' 8"  (1.727 m)      Objective:   Physical Exam  Constitutional: He is oriented to person, place, and time. He appears well-developed and well-nourished.  Morbid obesity  HENT:  Head: Normocephalic.  Eyes: Pupils are equal, round, and reactive to light.  Neck: Normal range of motion. Neck supple. No thyromegaly present.  Cardiovascular: Normal rate and regular rhythm.  Pulmonary/Chest: Effort normal and breath sounds  normal.  Abdominal: Soft.  Musculoskeletal:       Back:       Legs: Neurological: He is alert and oriented to person, place, and time.  Skin: Skin is warm and dry.  Psychiatric: He has a normal mood and affect.  Nursing note and vitals reviewed.      Assessment:     Encounter for routine adult physical exam with abnormal findings - Plan: PSA, CBC with Differential/Platelet, Comprehensive metabolic panel, TSH  Diabetes mellitus without complication (HCC) - Plan: POCT HgB A1C, Comprehensive metabolic panel, Lipid panel, lisinopril-hydrochlorothiazide (PRINZIDE,ZESTORETIC) 20-12.5 MG tablet  Hypertension, unspecified type - Plan: Lipid panel,  lisinopril-hydrochlorothiazide (PRINZIDE,ZESTORETIC) 20-12.5 MG tablet, metoprolol succinate (TOPROL-XL) 50 MG 24 hr tablet  Morbid obesity (HCC) - Plan: TSH  Chronic right-sided low back pain with right-sided sciatica - Plan: DG Lumbar Spine Complete, meloxicam (MOBIC) 7.5 MG tablet, tiZANidine (ZANAFLEX) 4 MG tablet  Pain in right hip - Plan: DG HIP UNILAT WITH PELVIS MIN 4 VIEWS RIGHT, meloxicam (MOBIC) 7.5 MG tablet, tiZANidine (ZANAFLEX) 4 MG tablet  Screening for colon cancer - Plan: Fecal occult blood, imunochemical  Mild intermittent asthma, unspecified whether complicated - Plan: VENTOLIN HFA 108 (90 Base) MCG/ACT inhaler  Gastroesophageal reflux disease without esophagitis - Plan: omeprazole (PRILOSEC) 40 MG capsule     Plan:      1. Annual wellness visit today 2. HgbA1c 5.8 today. Controlled through diet. Discussed how weight loss would help to manage DM and recommended he follow ADA diet.  3. htn well controlled. Continue bp meds as prescribed  4. Morbid obesity - discussed following low-calorie, ADA diet. recommnded he slowly increase very low0impact exercise to help him lower weight.  5. Right low back pain with right sciatica - meloxicam 7.5mg  twice daily. X-ray lumbar spine and right hip. Zanaflex 4mg  at bedtime if needed to help relieve tight and sore muscles.  8. Pain in right hip - x-ray right hip. meloxicam twice daily - order for new walker to help with balance and mobility will be given to home health today 9. FOBT ordered 10 continue to use rescue inhaler as needed and as prescribed. Strongly encouraged smoking cessation. 11. Continue omeprazole as prescribed   Follow up 4 months and sooner if needed

## 2017-12-05 ENCOUNTER — Other Ambulatory Visit: Payer: Self-pay | Admitting: Internal Medicine

## 2017-12-05 DIAGNOSIS — G8929 Other chronic pain: Secondary | ICD-10-CM

## 2017-12-05 DIAGNOSIS — M25551 Pain in right hip: Secondary | ICD-10-CM

## 2017-12-05 DIAGNOSIS — M5441 Lumbago with sciatica, right side: Principal | ICD-10-CM

## 2018-01-08 ENCOUNTER — Other Ambulatory Visit: Payer: Self-pay

## 2018-01-30 ENCOUNTER — Other Ambulatory Visit: Payer: Self-pay | Admitting: Nurse Practitioner

## 2018-01-30 DIAGNOSIS — G8929 Other chronic pain: Secondary | ICD-10-CM

## 2018-01-30 DIAGNOSIS — M25551 Pain in right hip: Secondary | ICD-10-CM

## 2018-01-30 DIAGNOSIS — M5441 Lumbago with sciatica, right side: Principal | ICD-10-CM

## 2018-01-30 MED ORDER — CYCLOBENZAPRINE HCL 10 MG PO TABS: 10.0000 mg | ORAL_TABLET | Freq: Two times a day (BID) | ORAL | 2 refills | Status: DC | PRN

## 2018-01-30 NOTE — Progress Notes (Signed)
Changed tizanidine to flexeril 10mg  - this may be taken up to twice daily if needed. May cause significant drowsiness so should be careful taking it.

## 2018-02-13 ENCOUNTER — Encounter: Payer: Self-pay | Admitting: Nurse Practitioner

## 2018-02-13 ENCOUNTER — Ambulatory Visit (INDEPENDENT_AMBULATORY_CARE_PROVIDER_SITE_OTHER): Payer: Managed Care, Other (non HMO) | Admitting: Nurse Practitioner

## 2018-02-13 VITALS — BP 163/90 | HR 82 | Resp 16 | Ht 73.0 in | Wt 369.0 lb

## 2018-02-13 DIAGNOSIS — G8929 Other chronic pain: Secondary | ICD-10-CM

## 2018-02-13 DIAGNOSIS — M5441 Lumbago with sciatica, right side: Secondary | ICD-10-CM | POA: Diagnosis not present

## 2018-02-13 DIAGNOSIS — I10 Essential (primary) hypertension: Secondary | ICD-10-CM | POA: Diagnosis not present

## 2018-02-13 DIAGNOSIS — M25551 Pain in right hip: Secondary | ICD-10-CM

## 2018-02-13 MED ORDER — TRAMADOL HCL 50 MG PO TABS: 50.0000 mg | ORAL_TABLET | Freq: Two times a day (BID) | ORAL | 2 refills | Status: DC | PRN

## 2018-02-13 MED ORDER — CYCLOBENZAPRINE HCL 10 MG PO TABS: 10.0000 mg | ORAL_TABLET | Freq: Two times a day (BID) | ORAL | 2 refills | Status: DC | PRN

## 2018-02-13 MED ORDER — MELOXICAM 7.5 MG PO TABS: 7.5000 mg | ORAL_TABLET | Freq: Two times a day (BID) | ORAL | 5 refills | Status: DC

## 2018-02-13 NOTE — Progress Notes (Signed)
[facility] [address] [place], [place] [address]  Internal MEDICINE  Office Visit Note  Patient Name: Thomas Tanner  [id number]  [id number]  Date of Service: 03/05/2018  Chief Complaint  Patient presents with  . Sciatica    right leg     The patient is here for sick visit. Today, he is complaining of low back pain with sciatica. Will get what feels like a softball on the right hip/butcheek. Hurts terribly. Was given rx for tizanidine in rcent visit. States that this did nothing for the pain. He also states that his meloxicam suddenly dropped in dosage from 2 times daily to once daily. Has really noticed a difference    Pt is here for a sick visit.     Current Medication:  Outpatient Encounter Medications as of 02/13/2018  Medication Sig  . aspirin EC 81 MG tablet Take 81 mg by mouth daily.  . cyclobenzaprine (FLEXERIL) 10 MG tablet Take 1 tablet (10 mg total) by mouth 2 (two) times daily as needed for muscle spasms.  Marland Kitchen glucose blood test strip 1 each by Other route as needed for other. Use as instructed  . lisinopril-hydrochlorothiazide (PRINZIDE,ZESTORETIC) 20-12.5 MG tablet Take 1 tablet by mouth daily.  . meloxicam (MOBIC) 7.5 MG tablet Take 1 tablet (7.5 mg total) by mouth 2 (two) times daily.  . metoprolol succinate (TOPROL-XL) 50 MG 24 hr tablet Take 1 tablet (50 mg total) by mouth daily.  Marland Kitchen omeprazole (PRILOSEC) 40 MG capsule Take 1 capsule (40 mg total) by mouth daily.  Letta Pate DELICA LANCETS 33G MISC by Does not apply route.  . traMADol (ULTRAM) 50 MG tablet Take 1 tablet (50 mg total) by mouth every 12 (twelve) hours as needed.  . triamcinolone cream (KENALOG) 0.5 % Apply 1 application topically 2 (two) times daily.  . VENTOLIN HFA 108 (90 Base) MCG/ACT inhaler Inhale 2 puffs into the lungs every 6 (six) hours as needed for wheezing or shortness of breath.  . [DISCONTINUED] cyclobenzaprine (FLEXERIL) 10 MG tablet Take 1 tablet (10 mg  total) by mouth 2 (two) times daily as needed for muscle spasms.  . [DISCONTINUED] meloxicam (MOBIC) 7.5 MG tablet Take 1 tablet (7.5 mg total) by mouth daily.   No facility-administered encounter medications on file as of 02/13/2018.       Medical History: Past Medical History:  Diagnosis Date  . Arthritis   . COPD (chronic obstructive pulmonary disease) (HCC)   . Diabetes (HCC)   . Hyperlipidemia   . Hypertension   . Kidney stone   . Obesity   . Tachycardia   . TIA (transient ischemic attack)      Vital Signs: Today's Vitals   02/13/18 1147  BP: (!) 163/90  Pulse: 82  Resp: 16  SpO2: 96%  Weight: (!) 369 lb (167.4 kg)  Height: 6\' 1"  (1.854 m)    Review of Systems  Constitutional: Positive for fatigue.       Weight gain of 13 pounds since most recent visit.   HENT: Negative for postnasal drip.   Eyes: Negative for redness and itching.  Respiratory: Positive for shortness of breath.   Cardiovascular: Negative for chest pain and palpitations.  Gastrointestinal: Negative for constipation, diarrhea, nausea and vomiting.  Endocrine:       Controls blood sugars through diet alone.   Genitourinary: Negative.   Musculoskeletal: Positive for arthralgias, back pain and myalgias.       Pain in right aspect of lower  back, radiating into right hip and upper leg, making standing for long periods of time and exercise, very difficult.   Skin: Negative for rash.  Allergic/Immunologic: Positive for environmental allergies.  Neurological: Positive for headaches.  Hematological: Negative.   Psychiatric/Behavioral:       Feeling some depressed over the past year due to the loss of several friends/family.     Physical Exam  Constitutional: He is oriented to person, place, and time. He appears well-developed and well-nourished.  Morbid obesity  HENT:  Head: Normocephalic and atraumatic.  Eyes: Pupils are equal, round, and reactive to light.  Neck: Normal range of motion. Neck  supple. No thyromegaly present.  Cardiovascular: Normal rate, regular rhythm and normal heart sounds.  Pulmonary/Chest: Effort normal and breath sounds normal. No respiratory distress. He has no wheezes.  Abdominal: Soft. Bowel sounds are normal. There is no tenderness.  Musculoskeletal:       Back:       Legs: Neurological: He is alert and oriented to person, place, and time.  Skin: Skin is warm and dry.  Psychiatric: He has a normal mood and affect. His behavior is normal. Judgment and thought content normal.  Nursing note and vitals reviewed.  Assessment/Plan: 1. Low back pain with right-sided sciatica, unspecified back pain laterality, unspecified chronicity - traMADol (ULTRAM) 50 MG tablet; Take 1 tablet (50 mg total) by mouth every 12 (twelve) hours as needed.  Dispense: 45 tablet; Refill: 2  2. Chronic right-sided low back pain with right-sided sciatica - cyclobenzaprine (FLEXERIL) 10 MG tablet; Take 1 tablet (10 mg total) by mouth 2 (two) times daily as needed for muscle spasms.  Dispense: 45 tablet; Refill: 2 - meloxicam (MOBIC) 7.5 MG tablet; Take 1 tablet (7.5 mg total) by mouth 2 (two) times daily.  Dispense: 60 tablet; Refill: 5  3. Pain in right hip - meloxicam (MOBIC) 7.5 MG tablet; Take 1 tablet (7.5 mg total) by mouth 2 (two) times daily.  Dispense: 60 tablet; Refill: 5  4. Essential hypertension Elevated today due to pain. Continue bp medication as prescribed. Reassess at next visit.   General Counseling: [name] verbalizes understanding of the findings of todays visit and agrees with plan of treatment. I have discussed any further diagnostic evaluation that may be needed or ordered today. We also reviewed his medications today. he has been encouraged to call the office with any questions or concerns that should arise related to todays visit.   Meds ordered this encounter  Medications  . cyclobenzaprine (FLEXERIL) 10 MG tablet    Sig: Take 1 tablet (10 mg total) by  mouth 2 (two) times daily as needed for muscle spasms.    Dispense:  45 tablet    Refill:  2    Order Specific Question:   Supervising Provider    Answer:   Lyndon CodeKHAN, FOZIA M [[id number]]  . meloxicam (MOBIC) 7.5 MG tablet    Sig: Take 1 tablet (7.5 mg total) by mouth 2 (two) times daily.    Dispense:  60 tablet    Refill:  5    Patient may take this twice daily    Order Specific Question:   Supervising Provider    Answer:   Lyndon CodeKHAN, FOZIA M [[id number]]  . traMADol (ULTRAM) 50 MG tablet    Sig: Take 1 tablet (50 mg total) by mouth every 12 (twelve) hours as needed.    Dispense:  45 tablet    Refill:  2    Order Specific Question:  Supervising Provider    Answer:   Lyndon Code [[id number]]    Time spent: 15 Minutes

## 2018-03-05 DIAGNOSIS — M25551 Pain in right hip: Secondary | ICD-10-CM | POA: Insufficient documentation

## 2018-03-05 DIAGNOSIS — M5441 Lumbago with sciatica, right side: Secondary | ICD-10-CM | POA: Insufficient documentation

## 2018-03-05 DIAGNOSIS — I10 Essential (primary) hypertension: Secondary | ICD-10-CM | POA: Insufficient documentation

## 2018-03-05 DIAGNOSIS — G8929 Other chronic pain: Secondary | ICD-10-CM | POA: Insufficient documentation

## 2018-04-03 ENCOUNTER — Encounter: Payer: Self-pay | Admitting: Nurse Practitioner

## 2018-04-03 ENCOUNTER — Ambulatory Visit (INDEPENDENT_AMBULATORY_CARE_PROVIDER_SITE_OTHER): Payer: Managed Care, Other (non HMO) | Admitting: Nurse Practitioner

## 2018-04-03 VITALS — BP 133/83 | HR 96 | Resp 16 | Ht 73.0 in | Wt 373.4 lb

## 2018-04-03 DIAGNOSIS — M5441 Lumbago with sciatica, right side: Secondary | ICD-10-CM

## 2018-04-03 DIAGNOSIS — I10 Essential (primary) hypertension: Secondary | ICD-10-CM | POA: Diagnosis not present

## 2018-04-03 DIAGNOSIS — R7301 Impaired fasting glucose: Secondary | ICD-10-CM | POA: Diagnosis not present

## 2018-04-03 DIAGNOSIS — G8929 Other chronic pain: Secondary | ICD-10-CM | POA: Diagnosis not present

## 2018-04-03 DIAGNOSIS — M25551 Pain in right hip: Secondary | ICD-10-CM

## 2018-04-03 LAB — POCT GLYCOSYLATED HEMOGLOBIN (HGB A1C): Hemoglobin A1C: 5.8

## 2018-04-03 MED ORDER — METOPROLOL SUCCINATE ER 50 MG PO TB24: 50.0000 mg | ORAL_TABLET | Freq: Every day | ORAL | 5 refills | Status: DC

## 2018-04-03 MED ORDER — MELOXICAM 7.5 MG PO TABS: 7.5000 mg | ORAL_TABLET | Freq: Two times a day (BID) | ORAL | 5 refills | Status: DC

## 2018-04-03 MED ORDER — LISINOPRIL-HYDROCHLOROTHIAZIDE 20-12.5 MG PO TABS: 1.0000 | ORAL_TABLET | Freq: Every day | ORAL | 5 refills | Status: DC

## 2018-04-03 NOTE — Progress Notes (Signed)
[facility] [address] [place], [place] [id number]  Internal MEDICINE  Office Visit Note  Patient Name: Thomas BodilyRichard Douglas Tanner  [id number]  [id number]  Date of Service: 04/03/2018  Chief Complaint  Patient presents with  . Diabetes    check HgbA1c   . Back Pain    with sciatica - follow up. improved.     The patient was seen for low back pain and severe sciatica at his last visit. Was given short term prescriptions for tramadol and cyclobenzaprine to treat the pain. He took very few of these medications, but is feeling, he states, 99% better. Will get an occasional twinge at times. Gradually starting to walk again, gradually increasing his distance. Plans to restart going to the gym next week.   Diabetes  He presents for his follow-up diabetic visit. He has type 2 diabetes mellitus. No MedicAlert identification noted. His disease course has been stable. There are no hypoglycemic associated symptoms. Pertinent negatives for hypoglycemia include no dizziness or headaches. There are no diabetic associated symptoms. Pertinent negatives for diabetes include no chest pain and no weakness. There are no hypoglycemic complications. Symptoms are stable. Diabetic complications include heart disease. Risk factors for coronary artery disease include dyslipidemia, hypertension, male sex and sedentary lifestyle. Current diabetic treatment includes diet. He is compliant with treatment most of the time. His weight is stable. He is following a generally healthy diet. He participates in exercise intermittently. There is no change in his home blood glucose trend. An ACE inhibitor/angiotensin II receptor blocker is being taken. He does not see a podiatrist.Eye exam is not current.  Back Pain  Pertinent negatives include no chest pain, headaches or weakness.    Pt is here for routine follow up.    Current Medication: Outpatient Encounter Medications as of 04/03/2018  Medication Sig  . aspirin  EC 81 MG tablet Take 81 mg by mouth daily.  . cyclobenzaprine (FLEXERIL) 10 MG tablet Take 1 tablet (10 mg total) by mouth 2 (two) times daily as needed for muscle spasms.  Marland Kitchen. glucose blood test strip 1 each by Other route as needed for other. Use as instructed  . lisinopril-hydrochlorothiazide (PRINZIDE,ZESTORETIC) 20-12.5 MG tablet Take 1 tablet by mouth daily.  . meloxicam (MOBIC) 7.5 MG tablet Take 1 tablet (7.5 mg total) by mouth 2 (two) times daily.  . metoprolol succinate (TOPROL-XL) 50 MG 24 hr tablet Take 1 tablet (50 mg total) by mouth daily.  Marland Kitchen. omeprazole (PRILOSEC) 40 MG capsule Take 1 capsule (40 mg total) by mouth daily.  Letta Pate. ONETOUCH DELICA LANCETS 33G MISC by Does not apply route.  . traMADol (ULTRAM) 50 MG tablet Take 1 tablet (50 mg total) by mouth every 12 (twelve) hours as needed.  . triamcinolone cream (KENALOG) 0.5 % Apply 1 application topically 2 (two) times daily.  . VENTOLIN HFA 108 (90 Base) MCG/ACT inhaler Inhale 2 puffs into the lungs every 6 (six) hours as needed for wheezing or shortness of breath.  . [DISCONTINUED] lisinopril-hydrochlorothiazide (PRINZIDE,ZESTORETIC) 20-12.5 MG tablet Take 1 tablet by mouth daily.  . [DISCONTINUED] meloxicam (MOBIC) 7.5 MG tablet Take 1 tablet (7.5 mg total) by mouth 2 (two) times daily.  . [DISCONTINUED] metoprolol succinate (TOPROL-XL) 50 MG 24 hr tablet Take 1 tablet (50 mg total) by mouth daily.   No facility-administered encounter medications on file as of 04/03/2018.     Surgical History: Past Surgical History:  Procedure Laterality Date  . KIDNEY STONE SURGERY    . left  arm surgery  1963    Medical History: Past Medical History:  Diagnosis Date  . Arthritis   . COPD (chronic obstructive pulmonary disease) (HCC)   . Diabetes (HCC)   . Hyperlipidemia   . Hypertension   . Kidney stone   . Obesity   . Tachycardia   . TIA (transient ischemic attack)     Family History: Family History  Family history unknown: Yes      Social History   Socioeconomic History  . Marital status: Married    Spouse name: Not on file  . Number of children: Not on file  . Years of education: Not on file  . Highest education level: Not on file  Occupational History  . Not on file  Social Needs  . Financial resource strain: Not on file  . Food insecurity:    Worry: Not on file    Inability: Not on file  . Transportation needs:    Medical: Not on file    Non-medical: Not on file  Tobacco Use  . Smoking status: Current Every Day Smoker    Types: Cigarettes  . Smokeless tobacco: Never Used  Substance and Sexual Activity  . Alcohol use: No  . Drug use: No  . Sexual activity: Not on file  Lifestyle  . Physical activity:    Days per week: Not on file    Minutes per session: Not on file  . Stress: Not on file  Relationships  . Social connections:    Talks on phone: Not on file    Gets together: Not on file    Attends religious service: Not on file    Active member of club or organization: Not on file    Attends meetings of clubs or organizations: Not on file    Relationship status: Not on file  . Intimate partner violence:    Fear of current or ex partner: Not on file    Emotionally abused: Not on file    Physically abused: Not on file    Forced sexual activity: Not on file  Other Topics Concern  . Not on file  Social History Narrative  . Not on file      Review of Systems  Constitutional: Negative for activity change, chills and unexpected weight change.       Weight gain of 13 pounds since most recent visit.   HENT: Positive for postnasal drip. Negative for congestion, rhinorrhea, sinus pressure and sore throat.   Eyes: Negative.  Negative for redness and itching.  Respiratory: Positive for shortness of breath.   Cardiovascular: Negative for chest pain.  Gastrointestinal: Negative for constipation, diarrhea, nausea and vomiting.  Endocrine:       Controls blood sugars through diet alone.    Genitourinary: Negative.   Musculoskeletal: Positive for arthralgias, back pain and myalgias.       Improved lower back pain and sciatica  Skin: Negative for rash.  Allergic/Immunologic: Positive for environmental allergies.  Neurological: Negative for dizziness, weakness and headaches.  Hematological: Negative.   Psychiatric/Behavioral:       Feeling some depressed over the past year due to the loss of several friends/family.    Today's Vitals   04/03/18 0842  BP: 133/83  Pulse: 96  Resp: 16  SpO2: 96%  Weight: (!) 373 lb 6.4 oz (169.4 kg)  Height: 6\' 1"  (1.854 m)     Physical Exam  Constitutional: He is oriented to person, place, and time. He appears well-developed and well-nourished.  Morbid obesity  HENT:  Head: Normocephalic and atraumatic.  Eyes: Pupils are equal, round, and reactive to light. Conjunctivae and EOM are normal.  Neck: Normal range of motion. Neck supple. Carotid bruit is not present. No thyromegaly present.  Cardiovascular: Normal rate, regular rhythm and normal heart sounds.  Mild tachycardia  Pulmonary/Chest: Effort normal and breath sounds normal. No respiratory distress. He has no wheezes.  Congested, non-productive cough  Abdominal: Soft. Bowel sounds are normal. There is no tenderness.  Musculoskeletal: Normal range of motion.       Back:       Legs: Pain located in lower back and in right hip and upper leg has improved significantly since his last visit. ROM and strength have improved.   Neurological: He is alert and oriented to person, place, and time.  Skin: Skin is warm and dry.  Psychiatric: He has a normal mood and affect. His behavior is normal. Judgment and thought content normal.  Nursing note and vitals reviewed.  Assessment/Plan:  1. Impaired fasting glucose - POCT HgB A1C 5.8 today. Continue to control blood sugar through diet. Gradually increase regular exercise as tolerated.   2. Hypertension, unspecified type Stable.  Continue bp medication as prescribed.  - lisinopril-hydrochlorothiazide (PRINZIDE,ZESTORETIC) 20-12.5 MG tablet; Take 1 tablet by mouth daily.  Dispense: 30 tablet; Refill: 5 - metoprolol succinate (TOPROL-XL) 50 MG 24 hr tablet; Take 1 tablet (50 mg total) by mouth daily.  Dispense: 30 tablet; Refill: 5  3. Chronic right-sided low back pain with right-sided sciatica Improved. Will continue meloxicam twice daily as needed.  - meloxicam (MOBIC) 7.5 MG tablet; Take 1 tablet (7.5 mg total) by mouth 2 (two) times daily.  Dispense: 60 tablet; Refill: 5  4. Pain in right hip Improved.  - meloxicam (MOBIC) 7.5 MG tablet; Take 1 tablet (7.5 mg total) by mouth 2 (two) times daily.  Dispense: 60 tablet; Refill: 5   General Counseling: Yousof verbalizes understanding of the findings of todays visit and agrees with plan of treatment. I have discussed any further diagnostic evaluation that may be needed or ordered today. We also reviewed his medications today. he has been encouraged to call the office with any questions or concerns that should arise related to todays visit.   Hypertension Counseling:   The following hypertensive lifestyle modification were recommended and discussed:  1. Limiting alcohol intake to less than 1 oz/day of ethanol:(24 oz of beer or 8 oz of wine or 2 oz of 100-proof whiskey). 2. Take baby ASA 81 mg daily. 3. Importance of regular aerobic exercise and losing weight. 4. Reduce dietary saturated fat and cholesterol intake for overall cardiovascular health. 5. Maintaining adequate dietary potassium, calcium, and magnesium intake. 6. Regular monitoring of the blood pressure. 7. Reduce sodium intake to less than 100 mmol/day (less than 2.3 gm of sodium or less than 6 gm of sodium choride)   This patient was seen by Vincent Gros, FNP- C in Collaboration with Dr Lyndon Code as a part of collaborative care agreement    Orders Placed This Encounter  Procedures  . POCT HgB A1C     Meds ordered this encounter  Medications  . lisinopril-hydrochlorothiazide (PRINZIDE,ZESTORETIC) 20-12.5 MG tablet    Sig: Take 1 tablet by mouth daily.    Dispense:  30 tablet    Refill:  5    Order Specific Question:   Supervising Provider    Answer:   Lyndon Code [[id number]]  . meloxicam (MOBIC) 7.5 MG  tablet    Sig: Take 1 tablet (7.5 mg total) by mouth 2 (two) times daily.    Dispense:  60 tablet    Refill:  5    Patient may take this twice daily    Order Specific Question:   Supervising Provider    Answer:   Lyndon Code [[id number]]  . metoprolol succinate (TOPROL-XL) 50 MG 24 hr tablet    Sig: Take 1 tablet (50 mg total) by mouth daily.    Dispense:  30 tablet    Refill:  5    Order Specific Question:   Supervising Provider    Answer:   Lyndon Code [[id number]]    Time spent: 90  Minutes     Dr Lyndon Code Internal medicine

## 2018-07-21 DIAGNOSIS — J309 Allergic rhinitis, unspecified: Secondary | ICD-10-CM | POA: Diagnosis not present

## 2018-07-21 DIAGNOSIS — Z6841 Body Mass Index (BMI) 40.0 and over, adult: Secondary | ICD-10-CM | POA: Diagnosis not present

## 2018-07-21 DIAGNOSIS — G629 Polyneuropathy, unspecified: Secondary | ICD-10-CM | POA: Diagnosis not present

## 2018-07-21 DIAGNOSIS — I10 Essential (primary) hypertension: Secondary | ICD-10-CM | POA: Diagnosis not present

## 2018-07-21 DIAGNOSIS — K08109 Complete loss of teeth, unspecified cause, unspecified class: Secondary | ICD-10-CM | POA: Diagnosis not present

## 2018-07-21 DIAGNOSIS — G8929 Other chronic pain: Secondary | ICD-10-CM | POA: Diagnosis not present

## 2018-07-21 DIAGNOSIS — R69 Illness, unspecified: Secondary | ICD-10-CM | POA: Diagnosis not present

## 2018-07-21 DIAGNOSIS — J449 Chronic obstructive pulmonary disease, unspecified: Secondary | ICD-10-CM | POA: Diagnosis not present

## 2018-07-21 DIAGNOSIS — I739 Peripheral vascular disease, unspecified: Secondary | ICD-10-CM | POA: Diagnosis not present

## 2018-08-04 ENCOUNTER — Ambulatory Visit (INDEPENDENT_AMBULATORY_CARE_PROVIDER_SITE_OTHER): Payer: Managed Care, Other (non HMO) | Admitting: Nurse Practitioner

## 2018-08-04 ENCOUNTER — Telehealth: Payer: Self-pay

## 2018-08-04 ENCOUNTER — Encounter: Payer: Self-pay | Admitting: Nurse Practitioner

## 2018-08-04 VITALS — BP 147/81 | HR 76 | Resp 16 | Ht 73.0 in | Wt 386.0 lb

## 2018-08-04 DIAGNOSIS — I10 Essential (primary) hypertension: Secondary | ICD-10-CM

## 2018-08-04 DIAGNOSIS — M25551 Pain in right hip: Secondary | ICD-10-CM

## 2018-08-04 DIAGNOSIS — J4520 Mild intermittent asthma, uncomplicated: Secondary | ICD-10-CM | POA: Insufficient documentation

## 2018-08-04 DIAGNOSIS — R7301 Impaired fasting glucose: Secondary | ICD-10-CM

## 2018-08-04 DIAGNOSIS — M25561 Pain in right knee: Secondary | ICD-10-CM

## 2018-08-04 DIAGNOSIS — G8929 Other chronic pain: Secondary | ICD-10-CM | POA: Diagnosis not present

## 2018-08-04 LAB — POCT GLYCOSYLATED HEMOGLOBIN (HGB A1C): HEMOGLOBIN A1C: 5.8 % — AB (ref 4.0–5.6)

## 2018-08-04 NOTE — Progress Notes (Signed)
[facility] [address] [place], [place] [address]  Internal MEDICINE  Office Visit Note  Patient Name: Thomas Tanner  [id number]  [id number]  Date of Service: 08/04/2018  Chief Complaint  Patient presents with  . Hypertension  . Osteoarthritis    right knee and left shoulder.     The patient is c/o left shoulder pain. Happened while he was working out at [employer] Recently started going back to the gym, waling on the treadmill. Continues to have intermittent pain in right knee. Does take meloxicam. States that he cannot really tell when he takes it, but has more pain when he does not take this.   Hypertension  This is a chronic problem. The current episode started more than 1 year ago. The problem is unchanged. The problem is resistant. Associated symptoms include shortness of breath. Pertinent negatives include no chest pain or headaches. Agents associated with hypertension include NSAIDs. Risk factors for coronary artery disease include dyslipidemia, male gender, obesity and sedentary lifestyle. Past treatments include ACE inhibitors and beta blockers. The current treatment provides moderate improvement. Compliance problems include exercise.        Current Medication: Outpatient Encounter Medications as of 08/04/2018  Medication Sig  . aspirin EC 81 MG tablet Take 81 mg by mouth daily.  . cyclobenzaprine (FLEXERIL) 10 MG tablet Take 1 tablet (10 mg total) by mouth 2 (two) times daily as needed for muscle spasms.  Marland Kitchen glucose blood test strip 1 each by Other route as needed for other. Use as instructed  . Krill Oil (OMEGA-3) 500 MG CAPS Take by mouth. Take 1 tab po daily  . lisinopril-hydrochlorothiazide (PRINZIDE,ZESTORETIC) 20-12.5 MG tablet Take 1 tablet by mouth daily.  . meloxicam (MOBIC) 7.5 MG tablet Take 1 tablet (7.5 mg total) by mouth 2 (two) times daily.  . metoprolol succinate (TOPROL-XL) 50 MG 24 hr tablet Take 1 tablet (50 mg total) by mouth  daily.  Marland Kitchen omeprazole (PRILOSEC) 40 MG capsule Take 1 capsule (40 mg total) by mouth daily.  Letta Pate DELICA LANCETS 33G MISC by Does not apply route.  . traMADol (ULTRAM) 50 MG tablet Take 1 tablet (50 mg total) by mouth every 12 (twelve) hours as needed.  . triamcinolone cream (KENALOG) 0.5 % Apply 1 application topically 2 (two) times daily.  . VENTOLIN HFA 108 (90 Base) MCG/ACT inhaler Inhale 2 puffs into the lungs every 6 (six) hours as needed for wheezing or shortness of breath.   No facility-administered encounter medications on file as of 08/04/2018.     Surgical History: Past Surgical History:  Procedure Laterality Date  . KIDNEY STONE SURGERY    . left arm surgery  1963    Medical History: Past Medical History:  Diagnosis Date  . Arthritis   . COPD (chronic obstructive pulmonary disease) (HCC)   . Diabetes (HCC)   . Hyperlipidemia   . Hypertension   . Kidney stone   . Obesity   . Tachycardia   . TIA (transient ischemic attack)     Family History: Family History  Family history unknown: Yes    Social History   Socioeconomic History  . Marital status: Married    Spouse name: Not on file  . Number of children: Not on file  . Years of education: Not on file  . Highest education level: Not on file  Occupational History  . Not on file  Social Needs  . Financial resource strain: Not on file  . Food  insecurity:    Worry: Not on file    Inability: Not on file  . Transportation needs:    Medical: Not on file    Non-medical: Not on file  Tobacco Use  . Smoking status: Current Every Day Smoker    Types: Cigarettes  . Smokeless tobacco: Never Used  Substance and Sexual Activity  . Alcohol use: No  . Drug use: No  . Sexual activity: Not on file  Lifestyle  . Physical activity:    Days per week: Not on file    Minutes per session: Not on file  . Stress: Not on file  Relationships  . Social connections:    Talks on phone: Not on file    Gets together:  Not on file    Attends religious service: Not on file    Active member of club or organization: Not on file    Attends meetings of clubs or organizations: Not on file    Relationship status: Not on file  . Intimate partner violence:    Fear of current or ex partner: Not on file    Emotionally abused: Not on file    Physically abused: Not on file    Forced sexual activity: Not on file  Other Topics Concern  . Not on file  Social History Narrative  . Not on file      Review of Systems  Constitutional: Negative for activity change, chills and unexpected weight change.       Weight gain of 13 pounds since most recent visit.   HENT: Negative for congestion, postnasal drip, rhinorrhea, sinus pressure and sore throat.   Eyes: Negative.  Negative for redness and itching.  Respiratory: Positive for shortness of breath.        Has noted a rattle in his chest and some wheezing since the weather has changed   Cardiovascular: Negative for chest pain.       Blood pressure elevated today.   Gastrointestinal: Negative for constipation, diarrhea, nausea and vomiting.  Endocrine:       Controls blood sugars through diet alone.   Genitourinary: Negative.   Musculoskeletal: Positive for arthralgias, back pain and myalgias.       Improved lower back pain and sciatica  Skin: Negative for rash.  Allergic/Immunologic: Positive for environmental allergies.  Neurological: Negative for dizziness, weakness and headaches.  Hematological: Negative for adenopathy.  Psychiatric/Behavioral: Negative for dysphoric mood. The patient is not nervous/anxious.        Feeling some depressed over the past year due to the loss of several friends/family.     Today's Vitals   08/04/18 0838  BP: (!) 147/81  Pulse: 76  Resp: 16  SpO2: 94%  Weight: (!) 386 lb (175.1 kg)  Height: 6\' 1"  (1.854 m)    Physical Exam  Constitutional: He is oriented to person, place, and time. He appears well-developed and  well-nourished. No distress.  HENT:  Head: Normocephalic and atraumatic.  Nose: Nose normal.  Mouth/Throat: Oropharynx is clear and moist. No oropharyngeal exudate.  Eyes: Pupils are equal, round, and reactive to light. Conjunctivae and EOM are normal.  Neck: Normal range of motion. Neck supple. No JVD present. Carotid bruit is not present. No tracheal deviation present. No thyromegaly present.  Cardiovascular: Normal rate, regular rhythm and normal heart sounds. Exam reveals no gallop and no friction rub.  No murmur heard. Pulmonary/Chest: Effort normal and breath sounds normal. No respiratory distress. He has no wheezes. He has no rales.  He exhibits no tenderness.  Abdominal: Soft. Bowel sounds are normal. There is tenderness.  Musculoskeletal: Normal range of motion.  Mild left shoulder tenderness. Worse with internal and external rotation. ROM and strength are intact.  Right knee and right hip are very tender. Pain in right knee is generally about 9/10 in severity at all times. Sometimes.this pain is more in the right hip. Makes walking without walker or cane very difficult. No visible or palpable abnormalities noted at this time.   Lymphadenopathy:    He has no cervical adenopathy.  Neurological: He is alert and oriented to person, place, and time. No cranial nerve deficit.  Skin: Skin is warm and dry. He is not diaphoretic.  Psychiatric: He has a normal mood and affect. His behavior is normal. Judgment and thought content normal.  Nursing note and vitals reviewed.  Assessment/Plan: 1. Hypertension, unspecified type Generally stable. Continue bp medication as prescribed   2. Impaired fasting glucose - POCT HgB A1C 5.8 today. Continue to control through diet and exercise.  3. Chronic pain of right knee Continue meloxicam twice daily when needed. Will consider getting x-rays next at next visit if still severe.   4. Pain in right hip Meloxicam 7.5mg  twice daily if needed for  pain/inflammation. Will get x-ray right hip at next visit if still bothersome at next visit.   5. Mild intermittent asthma, unspecified whether complicated Continue to use inhaler as needed and as prescribed .  General Counseling: Philipe verbalizes understanding of the findings of todays visit and agrees with plan of treatment. I have discussed any further diagnostic evaluation that may be needed or ordered today. We also reviewed his medications today. he has been encouraged to call the office with any questions or concerns that should arise related to todays visit.  Hypertension Counseling:   The following hypertensive lifestyle modification were recommended and discussed:  1. Limiting alcohol intake to less than 1 oz/day of ethanol:(24 oz of beer or 8 oz of wine or 2 oz of 100-proof whiskey). 2. Take baby ASA 81 mg daily. 3. Importance of regular aerobic exercise and losing weight. 4. Reduce dietary saturated fat and cholesterol intake for overall cardiovascular health. 5. Maintaining adequate dietary potassium, calcium, and magnesium intake. 6. Regular monitoring of the blood pressure. 7. Reduce sodium intake to less than 100 mmol/day (less than 2.3 gm of sodium or less than 6 gm of sodium choride)   This patient was seen by Vincent Gros FNP Collaboration with Dr Lyndon Code as a part of collaborative care agreement   Orders Placed This Encounter  Procedures  . POCT HgB A1C      Time spent: 15 Minutes      Dr Lyndon Code Internal medicine

## 2018-08-04 NOTE — Telephone Encounter (Signed)
Faxed out completed Healthcare Quality Patient Assessment Form with signed note attached to [employer]

## 2018-08-21 ENCOUNTER — Other Ambulatory Visit: Payer: Self-pay

## 2018-08-21 DIAGNOSIS — K219 Gastro-esophageal reflux disease without esophagitis: Secondary | ICD-10-CM

## 2018-08-21 MED ORDER — OMEPRAZOLE 40 MG PO CPDR: 40.0000 mg | DELAYED_RELEASE_CAPSULE | Freq: Every day | ORAL | 5 refills | Status: DC

## 2018-11-27 ENCOUNTER — Ambulatory Visit: Payer: Self-pay | Admitting: Nurse Practitioner

## 2018-12-08 ENCOUNTER — Encounter: Payer: Self-pay | Admitting: Internal Medicine

## 2018-12-08 ENCOUNTER — Ambulatory Visit: Payer: Self-pay | Admitting: Nurse Practitioner

## 2018-12-08 ENCOUNTER — Inpatient Hospital Stay
Admission: EM | Admit: 2018-12-08 | Discharge: 2018-12-10 | DRG: 247 | Disposition: A | Discharge status: Home / Self Care | Payer: Medicare HMO | Attending: Family Medicine | Admitting: Family Medicine

## 2018-12-08 ENCOUNTER — Emergency Department: Payer: Medicare HMO

## 2018-12-08 ENCOUNTER — Encounter
Admission: EM | Disposition: A | Discharge status: Home / Self Care | Payer: Self-pay | Source: Home / Self Care | Attending: Internal Medicine

## 2018-12-08 ENCOUNTER — Other Ambulatory Visit: Payer: Self-pay

## 2018-12-08 DIAGNOSIS — K219 Gastro-esophageal reflux disease without esophagitis: Secondary | ICD-10-CM | POA: Diagnosis present

## 2018-12-08 DIAGNOSIS — E119 Type 2 diabetes mellitus without complications: Secondary | ICD-10-CM | POA: Diagnosis present

## 2018-12-08 DIAGNOSIS — Z87442 Personal history of urinary calculi: Secondary | ICD-10-CM | POA: Diagnosis not present

## 2018-12-08 DIAGNOSIS — Z79899 Other long term (current) drug therapy: Secondary | ICD-10-CM

## 2018-12-08 DIAGNOSIS — Z885 Allergy status to narcotic agent status: Secondary | ICD-10-CM | POA: Diagnosis not present

## 2018-12-08 DIAGNOSIS — J449 Chronic obstructive pulmonary disease, unspecified: Secondary | ICD-10-CM

## 2018-12-08 DIAGNOSIS — I10 Essential (primary) hypertension: Secondary | ICD-10-CM | POA: Diagnosis present

## 2018-12-08 DIAGNOSIS — I2119 ST elevation (STEMI) myocardial infarction involving other coronary artery of inferior wall: Secondary | ICD-10-CM | POA: Diagnosis not present

## 2018-12-08 DIAGNOSIS — R000 Tachycardia, unspecified: Secondary | ICD-10-CM | POA: Diagnosis present

## 2018-12-08 DIAGNOSIS — Z8673 Personal history of transient ischemic attack (TIA), and cerebral infarction without residual deficits: Secondary | ICD-10-CM | POA: Diagnosis not present

## 2018-12-08 DIAGNOSIS — Z6841 Body Mass Index (BMI) 40.0 and over, adult: Secondary | ICD-10-CM

## 2018-12-08 DIAGNOSIS — M25559 Pain in unspecified hip: Secondary | ICD-10-CM | POA: Diagnosis present

## 2018-12-08 DIAGNOSIS — I213 ST elevation (STEMI) myocardial infarction of unspecified site: Secondary | ICD-10-CM | POA: Diagnosis present

## 2018-12-08 DIAGNOSIS — J9801 Acute bronchospasm: Secondary | ICD-10-CM | POA: Diagnosis present

## 2018-12-08 DIAGNOSIS — R600 Localized edema: Secondary | ICD-10-CM | POA: Diagnosis present

## 2018-12-08 DIAGNOSIS — Z7982 Long term (current) use of aspirin: Secondary | ICD-10-CM

## 2018-12-08 DIAGNOSIS — R0902 Hypoxemia: Secondary | ICD-10-CM | POA: Diagnosis not present

## 2018-12-08 DIAGNOSIS — M1610 Unilateral primary osteoarthritis, unspecified hip: Secondary | ICD-10-CM | POA: Diagnosis present

## 2018-12-08 DIAGNOSIS — M1990 Unspecified osteoarthritis, unspecified site: Secondary | ICD-10-CM | POA: Diagnosis present

## 2018-12-08 DIAGNOSIS — G8929 Other chronic pain: Secondary | ICD-10-CM | POA: Diagnosis present

## 2018-12-08 DIAGNOSIS — E785 Hyperlipidemia, unspecified: Secondary | ICD-10-CM | POA: Diagnosis present

## 2018-12-08 DIAGNOSIS — I2510 Atherosclerotic heart disease of native coronary artery without angina pectoris: Secondary | ICD-10-CM | POA: Diagnosis present

## 2018-12-08 DIAGNOSIS — Z9889 Other specified postprocedural states: Secondary | ICD-10-CM | POA: Diagnosis not present

## 2018-12-08 DIAGNOSIS — R079 Chest pain, unspecified: Secondary | ICD-10-CM | POA: Diagnosis not present

## 2018-12-08 DIAGNOSIS — M545 Low back pain: Secondary | ICD-10-CM | POA: Diagnosis present

## 2018-12-08 DIAGNOSIS — R0789 Other chest pain: Secondary | ICD-10-CM | POA: Diagnosis not present

## 2018-12-08 DIAGNOSIS — I2102 ST elevation (STEMI) myocardial infarction involving left anterior descending coronary artery: Secondary | ICD-10-CM | POA: Diagnosis not present

## 2018-12-08 DIAGNOSIS — F17210 Nicotine dependence, cigarettes, uncomplicated: Secondary | ICD-10-CM | POA: Diagnosis present

## 2018-12-08 DIAGNOSIS — E8881 Metabolic syndrome: Secondary | ICD-10-CM | POA: Diagnosis present

## 2018-12-08 DIAGNOSIS — R69 Illness, unspecified: Secondary | ICD-10-CM | POA: Diagnosis not present

## 2018-12-08 DIAGNOSIS — I214 Non-ST elevation (NSTEMI) myocardial infarction: Secondary | ICD-10-CM | POA: Diagnosis not present

## 2018-12-08 HISTORY — PX: LEFT HEART CATH AND CORONARY ANGIOGRAPHY: CATH118249

## 2018-12-08 HISTORY — PX: CORONARY/GRAFT ACUTE MI REVASCULARIZATION: CATH118305

## 2018-12-08 LAB — LIPID PANEL
Cholesterol: 163 mg/dL (ref 0–200)
HDL: 34 mg/dL — ABNORMAL LOW (ref >40)
LDL Cholesterol: 105 mg/dL — ABNORMAL HIGH (ref 0–99)
Total CHOL/HDL Ratio: 4.8 RATIO
Triglycerides: 121 mg/dL (ref <150)
VLDL: 24 mg/dL (ref 0–40)

## 2018-12-08 LAB — HEMOGLOBIN A1C
Hgb A1c MFr Bld: 5.4 % (ref 4.8–5.6)
Mean Plasma Glucose: 108.28 mg/dL

## 2018-12-08 LAB — CBC WITH DIFFERENTIAL/PLATELET
Abs Immature Granulocytes: 0.04 10*3/uL (ref 0.00–0.07)
Basophils Absolute: 0.1 10*3/uL (ref 0.0–0.1)
Basophils Relative: 1 %
EOS ABS: 0.1 10*3/uL (ref 0.0–0.5)
EOS PCT: 1 %
HCT: 52.4 % — ABNORMAL HIGH (ref 39.0–52.0)
Hemoglobin: 17 g/dL (ref 13.0–17.0)
Immature Granulocytes: 0 %
Lymphocytes Relative: 16 %
Lymphs Abs: 1.6 10*3/uL (ref 0.7–4.0)
MCH: 30.9 pg (ref 26.0–34.0)
MCHC: 32.4 g/dL (ref 30.0–36.0)
MCV: 95.3 fL (ref 80.0–100.0)
Monocytes Absolute: 0.7 10*3/uL (ref 0.1–1.0)
Monocytes Relative: 7 %
Neutro Abs: 8 10*3/uL — ABNORMAL HIGH (ref 1.7–7.7)
Neutrophils Relative %: 75 %
Platelets: 251 10*3/uL (ref 150–400)
RBC: 5.5 MIL/uL (ref 4.22–5.81)
RDW: 13.3 % (ref 11.5–15.5)
WBC: 10.5 10*3/uL (ref 4.0–10.5)
nRBC: 0 % (ref 0.0–0.2)

## 2018-12-08 LAB — COMPREHENSIVE METABOLIC PANEL
ALT: 15 U/L (ref 0–44)
AST: 37 U/L (ref 15–41)
Albumin: 3.7 g/dL (ref 3.5–5.0)
Alkaline Phosphatase: 120 U/L (ref 38–126)
Anion gap: 10 (ref 5–15)
BUN: 22 mg/dL (ref 8–23)
CALCIUM: 9.1 mg/dL (ref 8.9–10.3)
CO2: 26 mmol/L (ref 22–32)
Chloride: 98 mmol/L (ref 98–111)
Creatinine, Ser: 1.23 mg/dL (ref 0.61–1.24)
GFR calc Af Amer: >60 mL/min (ref >60)
Glucose, Bld: 142 mg/dL — ABNORMAL HIGH (ref 70–99)
Potassium: 4.2 mmol/L (ref 3.5–5.1)
Sodium: 134 mmol/L — ABNORMAL LOW (ref 135–145)
Total Bilirubin: 0.7 mg/dL (ref 0.3–1.2)
Total Protein: 7.1 g/dL (ref 6.5–8.1)

## 2018-12-08 LAB — APTT: aPTT: 24 seconds (ref 24–36)

## 2018-12-08 LAB — GLUCOSE, CAPILLARY
GLUCOSE-CAPILLARY: 136 mg/dL — AB (ref 70–99)
Glucose-Capillary: 152 mg/dL — ABNORMAL HIGH (ref 70–99)
Glucose-Capillary: 84 mg/dL (ref 70–99)
Glucose-Capillary: 106 mg/dL — ABNORMAL HIGH (ref 70–99)

## 2018-12-08 LAB — TROPONIN I
Troponin I: 0.23 ng/mL (ref <0.03)
Troponin I: 4.85 ng/mL (ref <0.03)
Troponin I: 18.04 ng/mL (ref <0.03)
Troponin I: 15.44 ng/mL (ref <0.03)

## 2018-12-08 LAB — TSH: TSH: 0.708 u[IU]/mL (ref 0.350–4.500)

## 2018-12-08 LAB — BRAIN NATRIURETIC PEPTIDE: B Natriuretic Peptide: 41 pg/mL (ref 0.0–100.0)

## 2018-12-08 LAB — POCT ACTIVATED CLOTTING TIME: ACTIVATED CLOTTING TIME: 356 s

## 2018-12-08 LAB — MRSA PCR SCREENING: MRSA by PCR: NEGATIVE

## 2018-12-08 SURGERY — CORONARY/GRAFT ACUTE MI REVASCULARIZATION
Anesthesia: Moderate Sedation

## 2018-12-08 MED ORDER — METOPROLOL SUCCINATE ER 50 MG PO TB24: 100.0000 mg | ORAL_TABLET | Freq: Every day | ORAL | Status: DC

## 2018-12-08 MED ORDER — ENOXAPARIN SODIUM 40 MG/0.4ML ~~LOC~~ SOLN
40.0000 mg | Freq: Two times a day (BID) | SUBCUTANEOUS | Status: DC
  Administered 2018-12-08 – 2018-12-10 (×5): 40 mg via SUBCUTANEOUS
  Filled 2018-12-08 (×5): qty 0.4

## 2018-12-08 MED ORDER — HYDRALAZINE HCL 20 MG/ML IJ SOLN: 5.0000 mg | INTRAMUSCULAR | Status: AC | PRN

## 2018-12-08 MED ORDER — ENOXAPARIN SODIUM 40 MG/0.4ML ~~LOC~~ SOLN: 40.0000 mg | SUBCUTANEOUS | Status: DC

## 2018-12-08 MED ORDER — TICAGRELOR 90 MG PO TABS
ORAL_TABLET | ORAL | Status: AC
  Filled 2018-12-08: qty 2

## 2018-12-08 MED ORDER — LISINOPRIL 10 MG PO TABS
10.0000 mg | ORAL_TABLET | Freq: Two times a day (BID) | ORAL | Status: DC
  Administered 2018-12-08 – 2018-12-10 (×3): 10 mg via ORAL
  Filled 2018-12-08 (×2): qty 2
  Filled 2018-12-08: qty 1
  Filled 2018-12-08: qty 2

## 2018-12-08 MED ORDER — LABETALOL HCL 5 MG/ML IV SOLN: 10.0000 mg | INTRAVENOUS | Status: AC | PRN

## 2018-12-08 MED ORDER — SODIUM CHLORIDE 0.9 % IV SOLN
INTRAVENOUS | Status: DC | PRN
  Administered 2018-12-08: 1.75 mg/kg/h via INTRAVENOUS

## 2018-12-08 MED ORDER — ATORVASTATIN CALCIUM 20 MG PO TABS
80.0000 mg | ORAL_TABLET | Freq: Every day | ORAL | Status: DC
  Administered 2018-12-08 – 2018-12-09 (×2): 80 mg via ORAL
  Filled 2018-12-08 (×2): qty 4

## 2018-12-08 MED ORDER — SODIUM CHLORIDE 0.9 % WEIGHT BASED INFUSION
1.0000 mL/kg/h | INTRAVENOUS | Status: AC
  Administered 2018-12-08: 1 mL/kg/h via INTRAVENOUS

## 2018-12-08 MED ORDER — FLUTICASONE FUROATE-VILANTEROL 100-25 MCG/INH IN AEPB
1.0000 | INHALATION_SPRAY | Freq: Every day | RESPIRATORY_TRACT | Status: DC
  Administered 2018-12-08 – 2018-12-10 (×3): 1 via RESPIRATORY_TRACT
  Filled 2018-12-08 (×2): qty 28

## 2018-12-08 MED ORDER — TIOTROPIUM BROMIDE MONOHYDRATE 18 MCG IN CAPS
18.0000 ug | ORAL_CAPSULE | Freq: Every day | RESPIRATORY_TRACT | Status: DC
  Administered 2018-12-08 – 2018-12-10 (×3): 18 ug via RESPIRATORY_TRACT
  Filled 2018-12-08 (×2): qty 5

## 2018-12-08 MED ORDER — ONDANSETRON HCL 4 MG/2ML IJ SOLN: 4.0000 mg | Freq: Four times a day (QID) | INTRAMUSCULAR | Status: DC | PRN

## 2018-12-08 MED ORDER — INSULIN ASPART 100 UNIT/ML ~~LOC~~ SOLN: 0.0000 [IU] | Freq: Every day | SUBCUTANEOUS | Status: DC

## 2018-12-08 MED ORDER — FUROSEMIDE 10 MG/ML IJ SOLN
INTRAMUSCULAR | Status: AC
  Filled 2018-12-08: qty 4

## 2018-12-08 MED ORDER — BIVALIRUDIN TRIFLUOROACETATE 250 MG IV SOLR
INTRAVENOUS | Status: AC
  Filled 2018-12-08: qty 250

## 2018-12-08 MED ORDER — FENTANYL CITRATE (PF) 100 MCG/2ML IJ SOLN
INTRAMUSCULAR | Status: DC | PRN
  Administered 2018-12-08: 25 ug via INTRAVENOUS
  Administered 2018-12-08: 50 ug via INTRAVENOUS
  Administered 2018-12-08: 25 ug via INTRAVENOUS

## 2018-12-08 MED ORDER — NITROGLYCERIN 5 MG/ML IV SOLN
INTRAVENOUS | Status: AC
  Filled 2018-12-08: qty 10

## 2018-12-08 MED ORDER — FUROSEMIDE 10 MG/ML IJ SOLN
INTRAMUSCULAR | Status: DC | PRN
  Administered 2018-12-08: 40 mg via INTRAVENOUS

## 2018-12-08 MED ORDER — ACETAMINOPHEN 325 MG PO TABS
650.0000 mg | ORAL_TABLET | Freq: Four times a day (QID) | ORAL | Status: DC | PRN
  Administered 2018-12-08 – 2018-12-09 (×2): 650 mg via ORAL
  Filled 2018-12-08: qty 2

## 2018-12-08 MED ORDER — ASPIRIN 325 MG PO TABS: 325.0000 mg | ORAL_TABLET | Freq: Every day | ORAL | Status: DC

## 2018-12-08 MED ORDER — IOPAMIDOL (ISOVUE-300) INJECTION 61%
INTRAVENOUS | Status: DC | PRN
  Administered 2018-12-08: 365 mL via INTRA_ARTERIAL

## 2018-12-08 MED ORDER — ASPIRIN 81 MG PO CHEW
81.0000 mg | CHEWABLE_TABLET | Freq: Every day | ORAL | Status: DC
  Administered 2018-12-08 – 2018-12-10 (×3): 81 mg via ORAL
  Filled 2018-12-08 (×3): qty 1

## 2018-12-08 MED ORDER — ACETAMINOPHEN 325 MG PO TABS
650.0000 mg | ORAL_TABLET | ORAL | Status: DC | PRN
  Filled 2018-12-08: qty 2

## 2018-12-08 MED ORDER — DOCUSATE SODIUM 100 MG PO CAPS
100.0000 mg | ORAL_CAPSULE | Freq: Two times a day (BID) | ORAL | Status: DC
  Administered 2018-12-08 – 2018-12-10 (×4): 100 mg via ORAL
  Filled 2018-12-08 (×4): qty 1

## 2018-12-08 MED ORDER — FENTANYL CITRATE (PF) 100 MCG/2ML IJ SOLN
INTRAMUSCULAR | Status: AC
  Filled 2018-12-08: qty 2

## 2018-12-08 MED ORDER — MIDAZOLAM HCL 2 MG/2ML IJ SOLN
INTRAMUSCULAR | Status: AC
  Filled 2018-12-08: qty 2

## 2018-12-08 MED ORDER — HEPARIN (PORCINE) IN NACL 1000-0.9 UT/500ML-% IV SOLN
INTRAVENOUS | Status: AC
  Filled 2018-12-08: qty 1000

## 2018-12-08 MED ORDER — TICAGRELOR 90 MG PO TABS
90.0000 mg | ORAL_TABLET | Freq: Two times a day (BID) | ORAL | Status: DC
  Administered 2018-12-08 – 2018-12-10 (×5): 90 mg via ORAL
  Filled 2018-12-08 (×7): qty 1

## 2018-12-08 MED ORDER — SODIUM CHLORIDE 0.9 % IV BOLUS
500.0000 mL | Freq: Once | INTRAVENOUS | Status: AC
  Administered 2018-12-08: 500 mL via INTRAVENOUS

## 2018-12-08 MED ORDER — INSULIN ASPART 100 UNIT/ML ~~LOC~~ SOLN
0.0000 [IU] | Freq: Three times a day (TID) | SUBCUTANEOUS | Status: DC
  Administered 2018-12-08: 2 [IU] via SUBCUTANEOUS
  Administered 2018-12-09: 1 [IU] via SUBCUTANEOUS
  Filled 2018-12-08 (×2): qty 1

## 2018-12-08 MED ORDER — SODIUM CHLORIDE 0.9 % IV SOLN
0.2500 mg/kg/h | INTRAVENOUS | Status: AC
  Administered 2018-12-08: 0.25 mg/kg/h via INTRAVENOUS
  Filled 2018-12-08 (×2): qty 250

## 2018-12-08 MED ORDER — METOPROLOL SUCCINATE ER 50 MG PO TB24
50.0000 mg | ORAL_TABLET | Freq: Every day | ORAL | Status: DC
  Administered 2018-12-10: 50 mg via ORAL
  Filled 2018-12-08 (×2): qty 1

## 2018-12-08 MED ORDER — HEPARIN (PORCINE) IN NACL 2000-0.9 UNIT/L-% IV SOLN
INTRAVENOUS | Status: DC | PRN
  Administered 2018-12-08: 1000 mL

## 2018-12-08 MED ORDER — NICOTINE 14 MG/24HR TD PT24
14.0000 mg | MEDICATED_PATCH | Freq: Every day | TRANSDERMAL | Status: DC
  Administered 2018-12-08 – 2018-12-10 (×3): 14 mg via TRANSDERMAL
  Filled 2018-12-08 (×3): qty 1

## 2018-12-08 MED ORDER — LABETALOL HCL 5 MG/ML IV SOLN
INTRAVENOUS | Status: DC | PRN
  Administered 2018-12-08: 10 mg via INTRAVENOUS

## 2018-12-08 MED ORDER — TICAGRELOR 90 MG PO TABS
ORAL_TABLET | ORAL | Status: DC | PRN
  Administered 2018-12-08: 180 mg via ORAL

## 2018-12-08 MED ORDER — ALBUTEROL SULFATE (2.5 MG/3ML) 0.083% IN NEBU: 2.5000 mg | INHALATION_SOLUTION | RESPIRATORY_TRACT | Status: DC | PRN

## 2018-12-08 MED ORDER — SODIUM CHLORIDE 0.9% FLUSH
3.0000 mL | Freq: Two times a day (BID) | INTRAVENOUS | Status: DC
  Administered 2018-12-08 – 2018-12-10 (×5): 3 mL via INTRAVENOUS

## 2018-12-08 MED ORDER — SODIUM CHLORIDE 0.9 % IV SOLN: 250.0000 mL | INTRAVENOUS | Status: DC | PRN

## 2018-12-08 MED ORDER — BIVALIRUDIN BOLUS VIA INFUSION - CUPID
INTRAVENOUS | Status: DC | PRN
  Administered 2018-12-08: 136.5 mg via INTRAVENOUS

## 2018-12-08 MED ORDER — LABETALOL HCL 5 MG/ML IV SOLN
INTRAVENOUS | Status: AC
  Filled 2018-12-08: qty 4

## 2018-12-08 MED ORDER — MIDAZOLAM HCL 2 MG/2ML IJ SOLN
INTRAMUSCULAR | Status: DC | PRN
  Administered 2018-12-08 (×2): 1 mg via INTRAVENOUS

## 2018-12-08 MED ORDER — ONDANSETRON HCL 4 MG PO TABS: 4.0000 mg | ORAL_TABLET | Freq: Four times a day (QID) | ORAL | Status: DC | PRN

## 2018-12-08 MED ORDER — SODIUM CHLORIDE 0.9% FLUSH
3.0000 mL | INTRAVENOUS | Status: DC | PRN
  Administered 2018-12-09: 3 mL via INTRAVENOUS
  Filled 2018-12-08: qty 3

## 2018-12-08 MED ORDER — ACETAMINOPHEN 650 MG RE SUPP: 650.0000 mg | Freq: Four times a day (QID) | RECTAL | Status: DC | PRN

## 2018-12-08 SURGICAL SUPPLY — 24 items
BALLN TREK RX 2.5X15 (BALLOONS) ×2
BALLN TREK RX 3.0X20 (BALLOONS) ×2
BALLN ~~LOC~~ TREK RX 3.75X12 (BALLOONS) ×2
BALLOON TREK RX 2.5X15 (BALLOONS) ×1 IMPLANT
BALLOON TREK RX 3.0X20 (BALLOONS) ×1 IMPLANT
BALLOON ~~LOC~~ TREK RX 3.75X12 (BALLOONS) ×1 IMPLANT
CATH INFINITI 5FR ANG PIGTAIL (CATHETERS) ×2 IMPLANT
CATH INFINITI 5FR JL4 (CATHETERS) ×2 IMPLANT
CATH LAUNCHER 6FR JR4 (CATHETERS) ×2 IMPLANT
CATH VISTA GUIDE 6FR XB3.5 (CATHETERS) ×2 IMPLANT
CATH VISTA GUIDE 6FR XB4 (CATHETERS) ×2 IMPLANT
DEVICE CLOSURE MYNXGRIP 6/7F (Vascular Products) ×2 IMPLANT
DEVICE INFLAT 30 PLUS (MISCELLANEOUS) ×2 IMPLANT
DEVICE SAFEGUARD 24CM (GAUZE/BANDAGES/DRESSINGS) ×2 IMPLANT
KIT MANI 3VAL PERCEP (MISCELLANEOUS) ×2 IMPLANT
NEEDLE PERC 18GX7CM (NEEDLE) ×2 IMPLANT
PACK CARDIAC CATH (CUSTOM PROCEDURE TRAY) ×2 IMPLANT
PANNUS RETENTION SYSTEM 2 PAD (MISCELLANEOUS) ×4 IMPLANT
SHEATH AVANTI 6FR X 11CM (SHEATH) ×2 IMPLANT
STENT SIERRA 2.75 X 23 MM (Permanent Stent) ×2 IMPLANT
STENT SIERRA 3.50 X 28 MM (Permanent Stent) ×2 IMPLANT
WIRE G HI TQ BMW 190 (WIRE) ×2 IMPLANT
WIRE GUIDERIGHT .035X150 (WIRE) ×2 IMPLANT
WIRE HITORQ VERSACORE ST 145CM (WIRE) ×2 IMPLANT

## 2018-12-08 NOTE — Consult Note (Signed)
Reason for Consult: STEMI Referring Physician: Dr. Lam[facility]  Thomas Tanner is an 67 y.o. male.  HPI: 67 year old morbidly obese white male COPD diabetes hypertension smoker previous TIA hyperlipidemia states that he woke up with substernal chest discomfort at about 130 finally decided come to emergency room was initially identified by EMS with ST elevation changes which are relatively subtle repeat EKG in the emergency room confirmed persistent 10 out of 10 chest pain as well as EKG changes with subtle inferior ST elevation with reciprocal depressions L.  The emergency room doctor activated code STEMI given aspirin and nitrates which slowed his blood pressure requiring volume resuscitation patient states his chest pain improved to 2/10.  Denies any previous cardiac history now is being referred for emergent cardiac cath  Past Medical History:  Diagnosis Date  . Arthritis   . COPD (chronic obstructive pulmonary disease) (HCC)   . Diabetes (HCC)   . Hyperlipidemia   . Hypertension   . Kidney stone   . Obesity   . Tachycardia   . TIA (transient ischemic attack)     Past Surgical History:  Procedure Laterality Date  . KIDNEY STONE SURGERY    . left arm surgery  1963    Family History  Family history unknown: Yes    Social History:  reports that he has been smoking cigarettes. He has never used smokeless tobacco. He reports that he does not drink alcohol or use drugs.  Allergies:  Allergies  Allergen Reactions  . Morphine And Related Anxiety    Medications: I have reviewed the patient's current medications.  Results for orders placed or performed during the hospital encounter of 12/08/18 (from the past 48 hour(s))  Brain natriuretic peptide     Status: None   Collection Time: 12/08/18  3:30 AM  Result Value Ref Range   B Natriuretic Peptide 41.0 0.0 - 100.0 pg/mL    Comment: Performed at [facility], [address] [place], [place] [address]   Troponin I - Once     Status: Abnormal   Collection Time: 12/08/18  3:30 AM  Result Value Ref Range   Troponin I 0.23 (HH) <0.03 ng/mL    Comment: CRITICAL RESULT CALLED TO, READ BACK BY AND VERIFIED WITH ANN PRIDE ON 12/08/18 AT [facility] Performed at [facility], [address] [place], [place] [address]   CBC with Differential     Status: Abnormal   Collection Time: 12/08/18  3:30 AM  Result Value Ref Range   WBC 10.5 4.0 - 10.5 K/uL   RBC 5.50 4.22 - 5.81 MIL/uL   Hemoglobin 17.0 13.0 - 17.0 g/dL   HCT 30.852.4 (H) 65.739.0 - 84.652.0 %   MCV 95.3 80.0 - 100.0 fL   MCH 30.9 26.0 - 34.0 pg   MCHC 32.4 30.0 - 36.0 g/dL   RDW 96.213.3 95.211.5 - 84.115.5 %   Platelets 251 150 - 400 K/uL   nRBC 0.0 0.0 - 0.2 %   Neutrophils Relative % 75 %   Neutro Abs 8.0 (H) 1.7 - 7.7 K/uL   Lymphocytes Relative 16 %   Lymphs Abs 1.6 0.7 - 4.0 K/uL   Monocytes Relative 7 %   Monocytes Absolute 0.7 0.1 - 1.0 K/uL   Eosinophils Relative 1 %   Eosinophils Absolute 0.1 0.0 - 0.5 K/uL   Basophils Relative 1 %   Basophils Absolute 0.1 0.0 - 0.1 K/uL   Immature Granulocytes 0 %   Abs Immature Granulocytes  0.04 0.00 - 0.07 K/uL    Comment: Performed at [facility], [address] [place], [place] [address]  APTT     Status: None   Collection Time: 12/08/18  3:30 AM  Result Value Ref Range   aPTT 24 24 - 36 seconds    Comment: Performed at [facility], [address] [place], [place] [address]  POCT Activated clotting time     Status: None   Collection Time: 12/08/18  4:27 AM  Result Value Ref Range   Activated Clotting Time 356 seconds    Dg Chest Port 1 View  Result Date: 12/08/2018 CLINICAL DATA:  Chest pain EXAM: PORTABLE CHEST 1 VIEW COMPARISON:  Chest radiograph 06/10/2013 FINDINGS: Mild cardiomegaly. Basilar predominant bilateral reticulonodular opacities. No large consolidation. No pleural effusion pneumothorax. IMPRESSION: 1. Mild cardiomegaly.  No pulmonary edema. 2.  Basilar predominant reticular opacities may be secondary to chronic bronchitis. Electronically Signed   By: Deatra RobinsonKevin  Herman M.D.   On: 12/08/2018 03:38    Review of Systems  Constitutional: Positive for diaphoresis and malaise/fatigue.  HENT: Positive for congestion.   Eyes: Negative.   Respiratory: Positive for cough, shortness of breath and wheezing.   Cardiovascular: Positive for chest pain, orthopnea, leg swelling and PND.  Gastrointestinal: Positive for heartburn.  Genitourinary: Negative.   Musculoskeletal: Positive for back pain, joint pain and myalgias.  Skin: Positive for rash.  Neurological: Positive for weakness.  Endo/Heme/Allergies: Negative.   Psychiatric/Behavioral: Negative.    Blood pressure (!) 153/70, pulse 90, resp. rate 20, height 6\' 1"  (1.854 m), weight (!) 182 kg, SpO2 97 %. Physical Exam  Nursing note and vitals reviewed. Constitutional: He is oriented to person, place, and time. He appears well-developed and well-nourished.  HENT:  Head: Normocephalic and atraumatic.  Eyes: Pupils are equal, round, and reactive to light. Conjunctivae and EOM are normal.  Neck: Normal range of motion. Neck supple.  Cardiovascular: Normal rate and regular rhythm.  Murmur heard. Respiratory: Effort normal and breath sounds normal.  GI: Soft. Bowel sounds are normal.  Musculoskeletal: Normal range of motion.        General: Edema present.  Neurological: He is alert and oriented to person, place, and time. He has normal reflexes.  Skin: Skin is warm and dry.  Psychiatric: He has a normal mood and affect.    Assessment/Plan: STEMI Acute coronary syndrome Obesity COPD Diabetes Hyperlipidemia Hypertension TIA Tachycardia Leg edema DJD hip Chronic pain GERD Smoking . Plan Agree with admit of myocardial infarction Follow-up EKGs and troponin Proceed directly to the cardiac Cath Lab with intention to treat possible intervention Anticoagulation Heparin or  Angiomax Aspirin therapy Hypertension control beta-blocker ACE inhibitor Continue diabetes management and control Supplemental oxygen as well as inhalers Consult hospitalist for medical therapy High-dose statin therapy probably Lipitor 80 mg once a day Dual antiplatelet therapy aspirin Brilinta for at least 12 months Continue omeprazole therapy for reflux symptoms chronically Antispasmodics for chronic lower back and hip pain Advised patient to refrain from tobacco abuse   Dwayne D Callwood 12/08/2018, 5:36 AM

## 2018-12-08 NOTE — ED Notes (Signed)
Pt on defib pads.  

## 2018-12-08 NOTE — ED Triage Notes (Signed)
Pt arrives ems with possible STEMI. Pt states he woke up at 0100 with sweating and left sided chest pain. Pt states he took 324asa at home pta of ems.

## 2018-12-08 NOTE — ED Provider Notes (Signed)
[facility] Emergency Department Provider Note  ____________________________________________   First MD Initiated Contact with Patient 12/08/18 [phone]     (approximate)  I have reviewed the triage vital signs and the nursing notes.   HISTORY  Chief Complaint Chest Pain   HPI Thomas Tanner is a 67 y.o. male comes to the emergency department via EMS with substernal pressure-like chest pain that woke him from sleep at 1:15 AM roughly 2 hours prior to arrival.   He was in his usual state of health when he went to bed last night and he awoke suddenly with the pain.  Seem to radiate to his left jaw and down his left arm.  Pain was constant and associated with some shortness of breath and nausea.  When he awoke he was actually drenched in sweat.  The pain was not ripping or tearing and did not go straight to his back.  He has a complex past medical history including morbid obesity, diabetes mellitus, hypertension, dyslipidemia, and previous transient ischemic attack.  EMS gave the patient 325 mg of aspirin and 1 tablet of nitroglycerin and then noted that that dropped his blood pressure to around 80 systolic so they deferred further nitrates.  They performed multiple EKGs which were all slightly different but none of which were interpreted as STEMI.  The patient's pain is currently down to around a 2 out of 10.   Past Medical History:  Diagnosis Date  . Arthritis   . COPD (chronic obstructive pulmonary disease) (HCC)   . Diabetes (HCC)   . Hyperlipidemia   . Hypertension   . Kidney stone   . Obesity   . Tachycardia   . TIA (transient ischemic attack)     Patient Active Problem List   Diagnosis Date Noted  . STEMI (ST elevation myocardial infarction) (HCC) 12/08/2018  . Chronic pain of right knee 08/04/2018  . Mild intermittent asthma 08/04/2018  . Impaired fasting glucose 04/03/2018  . Low back pain with right-sided sciatica 03/05/2018  . Chronic  right-sided low back pain with right-sided sciatica 03/05/2018  . Pain in right hip 03/05/2018  . Hypertension 03/05/2018    Past Surgical History:  Procedure Laterality Date  . CORONARY/GRAFT ACUTE MI REVASCULARIZATION N/A 12/08/2018   Procedure: Coronary/Graft Acute MI Revascularization;  Surgeon: Alwyn Pea, MD;  Location: [facility] INVASIVE CV LAB;  Service: Cardiovascular;  Laterality: N/A;  . KIDNEY STONE SURGERY    . left arm surgery  1963  . LEFT HEART CATH AND CORONARY ANGIOGRAPHY N/A 12/08/2018   Procedure: LEFT HEART CATH AND CORONARY ANGIOGRAPHY;  Surgeon: Alwyn Pea, MD;  Location: [facility] INVASIVE CV LAB;  Service: Cardiovascular;  Laterality: N/A;    Prior to Admission medications   Medication Sig Start Date End Date Taking? Authorizing Provider  aspirin 325 MG tablet Take 325 mg by mouth daily.   Yes [provider]  cyclobenzaprine (FLEXERIL) 10 MG tablet Take 1 tablet (10 mg total) by mouth 2 (two) times daily as needed for muscle spasms. 02/13/18   Carlean Jews, NP  glucose blood test strip 1 each by Other route as needed for other. Use as instructed    [provider]  [facility] Lanius (OMEGA-3) 500 MG CAPS Take by mouth. Take 1 tab po daily    [provider]  lisinopril-hydrochlorothiazide (PRINZIDE,ZESTORETIC) 20-12.5 MG tablet Take 1 tablet by mouth daily. 04/03/18   Carlean Jews, NP  metoprolol succinate (TOPROL-XL) 50 MG 24 hr  tablet Take 1 tablet (50 mg total) by mouth daily. 04/03/18   Carlean Jews, NP  omeprazole (PRILOSEC) 40 MG capsule Take 1 capsule (40 mg total) by mouth daily. 08/21/18   Carlean Jews, NP  ONETOUCH DELICA LANCETS 33G MISC by Does not apply route.    [provider]  triamcinolone cream (KENALOG) 0.5 % Apply 1 application topically 2 (two) times daily.    [provider]  VENTOLIN HFA 108 (90 Base) MCG/ACT inhaler Inhale 2 puffs into the lungs every 6 (six) hours as needed for  wheezing or shortness of breath. 12/02/17   Carlean Jews, NP    Allergies Morphine and related  Family History  Family history unknown: Yes    Social History Social History   Tobacco Use  . Smoking status: Current Every Day Smoker    Types: Cigarettes  . Smokeless tobacco: Never Used  Substance Use Topics  . Alcohol use: No  . Drug use: No    Review of Systems Constitutional: No fever/chills Eyes: No visual changes. ENT: No sore throat. Cardiovascular: Positive for chest pain. Respiratory: Positive for shortness of breath. Gastrointestinal: No abdominal pain.  Positive for nausea, no vomiting.  No diarrhea.  No constipation. Genitourinary: Negative for dysuria. Musculoskeletal: Negative for back pain. Skin: Negative for rash. Neurological: Negative for headaches, focal weakness or numbness.   ____________________________________________   PHYSICAL EXAM:  VITAL SIGNS: ED Triage Vitals  Enc Vitals Group     BP      Pulse      Resp      Temp      Temp src      SpO2      Weight      Height      Head Circumference      Peak Flow      Pain Score      Pain Loc      Pain Edu?      Excl. in GC?     Constitutional: Alert and oriented x4 joking laughing pleasant cooperative speaks in full clear sentences no diaphoresis Eyes: PERRL EOMI. Head: Atraumatic. Nose: No congestion/rhinnorhea. Mouth/Throat: No trismus Neck: No stridor.   Cardiovascular: Normal rate, regular rhythm. Grossly normal heart sounds.  Good peripheral circulation. Respiratory: Slightly increased respiratory effort.  No retractions. Lungs CTAB and moving good air Gastrointestinal: Morbidly obese soft nontender Musculoskeletal: No lower extremity edema   Neurologic:  Normal speech and language. No gross focal neurologic deficits are appreciated. Skin:  Skin is warm, dry and intact. No rash noted. Psychiatric: Mood and affect are normal. Speech and behavior are  normal.    ____________________________________________   DIFFERENTIAL includes but not limited to  STEMI, non-STEMI, pulmonary embolism, aortic dissection, pneumonia, pneumothorax, heart failure ____________________________________________   LABS (all labs ordered are listed, but only abnormal results are displayed)  Labs Reviewed  TROPONIN I - Abnormal; Notable for the following components:      Result Value   Troponin I 0.23 (*)    All other components within normal limits  CBC WITH DIFFERENTIAL/PLATELET - Abnormal; Notable for the following components:   HCT 52.4 (*)    Neutro Abs 8.0 (*)    All other components within normal limits  COMPREHENSIVE METABOLIC PANEL - Abnormal; Notable for the following components:   Sodium 134 (*)    Glucose, Bld 142 (*)    All other components within normal limits  TROPONIN I - Abnormal; Notable for the following components:  Troponin I 4.85 (*)    All other components within normal limits  GLUCOSE, CAPILLARY - Abnormal; Notable for the following components:   Glucose-Capillary 152 (*)    All other components within normal limits  MRSA PCR SCREENING  BRAIN NATRIURETIC PEPTIDE  APTT  TSH  TROPONIN I  TROPONIN I  HEMOGLOBIN A1C  POCT ACTIVATED CLOTTING TIME    Lab work reviewed by me shows elevated troponin which is rising consistent with acute STEMI __________________________________________  EKG  ED ECG REPORT I, Merrily BrittleNeil Raley Novicki, the attending physician, personally viewed and interpreted this ECG.  Date: 12/08/2018 EKG Time:  Rate: 65 Rhythm: normal sinus rhythm QRS Axis: normal Intervals: normal ST/T Wave abnormalities: Subtle ST elevation in leads III and aVF with T wave in aVF larger than the QRS complex.  Slight reciprocal depression in aVL Narrative Interpretation: Consistent with inferior STEMI  ____________________________________________  RADIOLOGY  Chest x-ray reviewed by me shows  cardiomegaly ____________________________________________   PROCEDURES  Procedure(s) performed: no  .Critical Care Performed by: Merrily Brittleifenbark, Savas Elvin, MD Authorized by: Merrily Brittleifenbark, Travers Goodley, MD   Critical care provider statement:    Critical care time (minutes):  30   Critical care time was exclusive of:  Separately billable procedures and treating other patients   Critical care was necessary to treat or prevent imminent or life-threatening deterioration of the following conditions:  Cardiac failure   Critical care was time spent personally by me on the following activities:  Development of treatment plan with patient or surrogate, discussions with consultants, evaluation of patient's response to treatment, examination of patient, obtaining history from patient or surrogate, ordering and performing treatments and interventions, ordering and review of laboratory studies, ordering and review of radiographic studies, pulse oximetry, re-evaluation of patient's condition and review of old charts    Critical Care performed: Yes  ____________________________________________   INITIAL IMPRESSION / ASSESSMENT AND PLAN / ED COURSE  Pertinent labs & imaging results that were available during my care of the patient were reviewed by me and considered in my medical decision making (see chart for details).   As part of my medical decision making, I reviewed the following data within the electronic MEDICAL RECORD NUMBER History obtained from family if available, nursing notes, old chart and ekg, as well as notes from prior ED visits.  The patient comes to the emergency department quite well-appearing although his story of sudden onset chest pain radiating to his jaw and left arm associated with diaphoresis is quite concerning.  I personally reviewed the patient's prehospital EKGs and they appear to show an inferior STEMI.  He is already been given appropriate dose of aspirin and his blood pressure bottomed out after  nitrates which is what I would expect.  We obtained our own EKG here in the emergency department which shows 1 mm of elevation and leads III and aVF along with slight depression in aVL consistent with reciprocal change.  I have activated the catheterization lab and we will give gentle IV fluids now to help his preload.  Dr. Juliann Paresallwood came to the patient's bedside and agrees that the patient's story and EKGs are consistent with STEMI.  He is taking the patient emergently to the Cath Lab now.  I have notified the hospitalist.      ____________________________________________   FINAL CLINICAL IMPRESSION(S) / ED DIAGNOSES  Final diagnoses:  ST elevation myocardial infarction (STEMI), unspecified artery (HCC)      NEW MEDICATIONS STARTED DURING THIS VISIT:  Current Discharge Medication List  Note:  This document was prepared using Dragon voice recognition software and may include unintentional dictation errors.    Merrily Brittleifenbark, Amardeep Beckers, MD 12/08/18 [phone]

## 2018-12-08 NOTE — Progress Notes (Signed)
PHARMACY NOTE:  ANTICOAGULANT RENAL DOSAGE ADJUSTMENT  Current antimicrobial regimen includes a mismatch between anticoagulant dosage and estimated renal function.  As per policy approved by the Pharmacy & Therapeutics and Medical Executive Committees, the anticoagulant dosage will be adjusted accordingly.  Current anticoagulant dosage:  enoxaparin 40mg  SQ Q24hr   Indication: DVT Prophylaxis   Renal Function:  Estimated Creatinine Clearance: 101 mL/min (by C-G formula based on SCr of 1.23 mg/dL). Body mass index is 51.92 kg/m.  Anticoagulant dosage has been changed to:  Enoxaparin 40mg  SQ BID  Thank you for allowing pharmacy to be a part of this patient's care.  Murl Zogg L, [facility] 12/08/2018 8:48 AM

## 2018-12-08 NOTE — Progress Notes (Signed)
Follow up - Critical Care Medicine Note  Patient Details:    Thomas Tanner is an 67 y.o. male.  With a past medical history remarkable for hypertension, hyperlipidemia, COPD, diabetes, presented to the emergency department with complaints of chest pain, beginning substernally and subsequent left shoulder radiation.  Patient also complains of nausea.  In emergency department he was noted to have STEMI, was subsequently taken to the cath lab, PCI/DES was performed, stents to RCA and mid LAD on post catheterization order set   Anti-infectives:  Anti-infectives (From admission, onward)   None      Microbiology: Results for orders placed or performed during the hospital encounter of 12/08/18  MRSA PCR Screening     Status: None   Collection Time: 12/08/18  6:49 AM  Result Value Ref Range Status   MRSA by PCR NEGATIVE NEGATIVE Final    Comment:        The GeneXpert MRSA Assay (FDA approved for NASAL specimens only), is one component of a comprehensive MRSA colonization surveillance program. It is not intended to diagnose MRSA infection nor to guide or monitor treatment for MRSA infections. Performed at [facility], [address] [place], [place] [address]    Studies: [facility]  Result Date: 12/08/2018 CLINICAL DATA:  Chest pain EXAM: PORTABLE CHEST 1 VIEW COMPARISON:  Chest radiograph 06/10/2013 FINDINGS: Mild cardiomegaly. Basilar predominant bilateral reticulonodular opacities. No large consolidation. No pleural effusion pneumothorax. IMPRESSION: 1. Mild cardiomegaly.  No pulmonary edema. 2. Basilar predominant reticular opacities may be secondary to chronic bronchitis. Electronically Signed   By: Deatra RobinsonKevin  Herman M.D.   On: 12/08/2018 03:38    Consults:    Subjective:    Overnight Issues: She is presently resting comfortably, pain-free  Objective:  Vital signs for last 24 hours: Temp:  [97.7 F (36.5 C)-98.6 F (37 C)] 98.6 F (37 C)  (12/30 0700) Pulse Rate:  [88-100] 100 (12/30 0800) Resp:  [17-28] 28 (12/30 0800) BP: (105-153)/(54-120) 115/76 (12/30 0800) SpO2:  [92 %-98 %] 97 % (12/30 0800) Weight:  [178.5 kg-182 kg] 178.5 kg (12/30 0641)  Hemodynamic parameters for last 24 hours:    Intake/Output from previous day: 12/29 0701 - 12/30 0700 In: -  Out: 975 [Urine:975]  Intake/Output this shift: Total I/O In: -  Out: 1300 [Urine:1300]  Vent settings for last 24 hours:    Physical Exam:  Vital signs: Please see the above listed vital signs HEENT: Trachea midline, no thyromegaly appreciated, no oral lesions noted or jugular venous distention Cardiovascular: Stable hemodynamics, regular rate and rhythm Pulmonary: Clear to auscultation Abdominal: Positive bowel sounds, soft exam Extremities: Adequate pulses in the right lower extremity, no clubbing, cyanosis or edema noted Neurologic: No focal deficits noted  Assessment/Plan:   STEMI.  Status post PCI/DES to RCA and LAD.  Being managed by cardiology.  Is on dual antiplatelet agents, beta-blockade, statin.  Stable hemodynamics at this time  COPD.  Bronchospasm improving.  Agree with Spiriva with rescue albuterol  Hypertension.  On lisinopril and metoprolol with as needed hydralazine and labetalol  Most likely will be stable for telemetry transfer today pending cardiolog   Thomas Tanner 12/08/2018  *Care during the described time interval was provided by me and/or other providers on the critical care team.  I have reviewed this patient's available data, including medical history, events of note, physical examination and test results as part of my evaluation.

## 2018-12-08 NOTE — Progress Notes (Signed)
[employer] [facility] [place] [facility]   PATIENT NAME: Thomas Tanner    MR#:  [id number]  DATE OF BIRTH:  06/17/1951  SUBJECTIVE:  CHIEF COMPLAINT:   Chief Complaint  Patient presents with  . Chest Pain   -Very obese gentleman.  Admitted with chest pain and noted to have STEMI-status post PCI to RCA and left anterior descending. -Minimal chest pain now.  REVIEW OF SYSTEMS:  Review of Systems  Constitutional: Negative for chills, fever and malaise/fatigue.  HENT: Negative for congestion, ear discharge, hearing loss and nosebleeds.   Eyes: Negative for blurred vision and double vision.  Respiratory: Positive for shortness of breath. Negative for cough and wheezing.   Cardiovascular: Positive for chest pain. Negative for palpitations.  Gastrointestinal: Negative for abdominal pain, constipation, diarrhea, nausea and vomiting.  Genitourinary: Negative for dysuria.  Musculoskeletal: Negative for myalgias.  Neurological: Negative for dizziness, focal weakness, seizures, weakness and headaches.  Psychiatric/Behavioral: Negative for depression.    DRUG ALLERGIES:   Allergies  Allergen Reactions  . Morphine And Related Anxiety    VITALS:  Blood pressure 105/60, pulse 89, temperature 98.6 F (37 C), resp. rate 20, height 6\' 1"  (1.854 m), weight (!) 178.5 kg, SpO2 95 %.  PHYSICAL EXAMINATION:  Physical Exam   GENERAL:  67 y.o.-year-old morbidly obese patient lying in the bed with no acute distress.  EYES: Pupils equal, round, reactive to light and accommodation. No scleral icterus. Extraocular muscles intact.  HEENT: Head atraumatic, normocephalic. Oropharynx and nasopharynx clear.  NECK:  Supple, no jugular venous distention. No thyroid enlargement, no tenderness.  LUNGS: Normal breath sounds bilaterally, occasional scattered wheezes, no rales,rhonchi or crepitation. No use of accessory muscles of respiration.  Decreased bibasilar breath sounds cARDIOVASCULAR:  S1, S2 normal. No murmurs, rubs, or gallops.  ABDOMEN: Soft, obese abdomen, nontender, nondistended. Bowel sounds present. No organomegaly or mass.  EXTREMITIES: No  cyanosis, or clubbing.  1+ bilateral lower extremity edema Right groin catheterization site with minimal bloody discharge NEUROLOGIC: Cranial nerves II through XII are intact. Muscle strength 5/5 in all extremities. Sensation intact. Gait not checked.  PSYCHIATRIC: The patient is alert and oriented x 3.  SKIN: No obvious rash, lesion, or ulcer.    LABORATORY PANEL:   CBC Recent Labs  Lab 12/08/18 0330  WBC 10.5  HGB 17.0  HCT 52.4*  PLT 251   ------------------------------------------------------------------------------------------------------------------  Chemistries  Recent Labs  Lab 12/08/18 0657  NA 134*  K 4.2  CL 98  CO2 26  GLUCOSE 142*  BUN 22  CREATININE 1.23  CALCIUM 9.1  AST 37  ALT 15  ALKPHOS 120  BILITOT 0.7   ------------------------------------------------------------------------------------------------------------------  Cardiac Enzymes Recent Labs  Lab 12/08/18 0657  TROPONINI 4.85*   ------------------------------------------------------------------------------------------------------------------  RADIOLOGY:  Dg Chest Port 1 View  Result Date: 12/08/2018 CLINICAL DATA:  Chest pain EXAM: PORTABLE CHEST 1 VIEW COMPARISON:  Chest radiograph 06/10/2013 FINDINGS: Mild cardiomegaly. Basilar predominant bilateral reticulonodular opacities. No large consolidation. No pleural effusion pneumothorax. IMPRESSION: 1. Mild cardiomegaly.  No pulmonary edema. 2. Basilar predominant reticular opacities may be secondary to chronic bronchitis. Electronically Signed   By: Deatra RobinsonKevin  Herman M.D.   On: 12/08/2018 03:38    EKG:   Orders placed or performed during the hospital encounter of 12/08/18  . Repeat EKG  . Repeat EKG  . EKG 12-Lead immediately post procedure  . EKG 12-Lead  . EKG 12-Lead  immediately post procedure    ASSESSMENT AND PLAN:  67 year old morbidly obese male with past medical history significant for COPD not on home oxygen, hypertension, diabetes, history of TIA presents from home secondary to chest pain and noted to have STEMI.  1.  STEMI-anterior and inferior STEMI, status post cardiac catheterization and has PCI done to RCA and LAD. -Left circumflex with 50% blockage. -Started on aspirin, Brilinta, statin, metoprolol and lisinopril -Appreciate cardiology consult. -Symptomatically improving.  Follow-up echocardiogram.  Continue to monitor in ICU today -weight Reduction and lifestyle modification recommended. - cardiac rehab at discharge - 2.  Diabetes mellitus-continue on sliding scale insulin -A1c is 5.8  3.  COPD-minimal scattered expiratory wheeze.  No indication for systemic steroids today.  Continue inhalers and PRN nebulizers  4.  Hypertension-continue metoprolol and lisinopril  5.  DVT prophylaxis-patient is on Lovenox     All the records are reviewed and case discussed with Care Management/Social Workerr. Management plans discussed with the patient, family and they are in agreement.  CODE STATUS: Full code  TOTAL TIME TAKING CARE OF THIS PATIENT: 38 minutes.   POSSIBLE D/C IN 2 DAYS, DEPENDING ON CLINICAL CONDITION.   Enid Baasadhika Travonta Gill M.D on 12/08/2018 at 11:20 AM  Between 7am to 6pm - Pager - [phone]  After 6pm go to [email or web address] - Social research officer, [email or web address] [email or web address]  [facility]  Office  [phone]  CC: Primary care physician; Carlean JewsBoscia, Heather E, NP

## 2018-12-08 NOTE — H&P (Signed)
Thomas Tanner is an 67 y.o. male.   Chief Complaint: Chest pain HPI: The patient with past medical history of COPD, hypertension and diabetes presents to the emergency department complaining of chest pain.  The pain began at rest and was sharp in character.  The patient reports that it began substernally but radiated some toward his left shoulder.  He remembers feeling nauseous but he did not vomit.  He cannot recall if he experienced shortness of breath but admits to some diaphoresis.  In the emergency department an EKG showed ST elevations which prompted activation of the cath team who took the patient for revascularization prior to the hospitalist service being called for admission.  Past Medical History:  Diagnosis Date  . Arthritis   . COPD (chronic obstructive pulmonary disease) (HCC)   . Diabetes (HCC)   . Hyperlipidemia   . Hypertension   . Kidney stone   . Obesity   . Tachycardia   . TIA (transient ischemic attack)     Past Surgical History:  Procedure Laterality Date  . KIDNEY STONE SURGERY    . left arm surgery  1963    Family History  Family history unknown: Yes   Social History:  reports that he has been smoking cigarettes. He has never used smokeless tobacco. He reports that he does not drink alcohol or use drugs.  Allergies:  Allergies  Allergen Reactions  . Morphine And Related Anxiety    Medications Prior to Admission  Medication Sig Dispense Refill  . aspirin 325 MG tablet Take 325 mg by mouth daily.    . cyclobenzaprine (FLEXERIL) 10 MG tablet Take 1 tablet (10 mg total) by mouth 2 (two) times daily as needed for muscle spasms. 45 tablet 2  . glucose blood test strip 1 each by Other route as needed for other. Use as instructed    . Krill Oil (OMEGA-3) 500 MG CAPS Take by mouth. Take 1 tab po daily    . lisinopril-hydrochlorothiazide (PRINZIDE,ZESTORETIC) 20-12.5 MG tablet Take 1 tablet by mouth daily. 30 tablet 5  . metoprolol succinate  (TOPROL-XL) 50 MG 24 hr tablet Take 1 tablet (50 mg total) by mouth daily. 30 tablet 5  . omeprazole (PRILOSEC) 40 MG capsule Take 1 capsule (40 mg total) by mouth daily. 30 capsule 5  . ONETOUCH DELICA LANCETS 33G MISC by Does not apply route.    . triamcinolone cream (KENALOG) 0.5 % Apply 1 application topically 2 (two) times daily.    . VENTOLIN HFA 108 (90 Base) MCG/ACT inhaler Inhale 2 puffs into the lungs every 6 (six) hours as needed for wheezing or shortness of breath. 1 Inhaler 5    Results for orders placed or performed during the hospital encounter of 12/08/18 (from the past 48 hour(s))  Brain natriuretic peptide     Status: None   Collection Time: 12/08/18  3:30 AM  Result Value Ref Range   B Natriuretic Peptide 41.0 0.0 - 100.0 pg/mL    Comment: Performed at [facility], [address] [place], [place] [address]  Troponin I - Once     Status: Abnormal   Collection Time: 12/08/18  3:30 AM  Result Value Ref Range   Troponin I 0.23 (HH) <0.03 ng/mL    Comment: CRITICAL RESULT CALLED TO, READ BACK BY AND VERIFIED WITH ANN PRIDE ON 12/08/18 AT 0359 [facility] Performed at [facility], [address] [place], [place] [address]   CBC with Differential  Status: Abnormal   Collection Time: 12/08/18  3:30 AM  Result Value Ref Range   WBC 10.5 4.0 - 10.5 K/uL   RBC 5.50 4.22 - 5.81 MIL/uL   Hemoglobin 17.0 13.0 - 17.0 g/dL   HCT 40.952.4 (H) 81.139.0 - 91.452.0 %   MCV 95.3 80.0 - 100.0 fL   MCH 30.9 26.0 - 34.0 pg   MCHC 32.4 30.0 - 36.0 g/dL   RDW 78.213.3 95.611.5 - 21.315.5 %   Platelets 251 150 - 400 K/uL   nRBC 0.0 0.0 - 0.2 %   Neutrophils Relative % 75 %   Neutro Abs 8.0 (H) 1.7 - 7.7 K/uL   Lymphocytes Relative 16 %   Lymphs Abs 1.6 0.7 - 4.0 K/uL   Monocytes Relative 7 %   Monocytes Absolute 0.7 0.1 - 1.0 K/uL   Eosinophils Relative 1 %   Eosinophils Absolute 0.1 0.0 - 0.5 K/uL   Basophils Relative 1 %   Basophils Absolute 0.1 0.0 - 0.1 K/uL   Immature Granulocytes  0 %   Abs Immature Granulocytes 0.04 0.00 - 0.07 K/uL    Comment: Performed at [facility], [address] [place], [place] [address]  APTT     Status: None   Collection Time: 12/08/18  3:30 AM  Result Value Ref Range   aPTT 24 24 - 36 seconds    Comment: Performed at [facility], [address] [place], [place] [address]  POCT Activated clotting time     Status: None   Collection Time: 12/08/18  4:27 AM  Result Value Ref Range   Activated Clotting Time 356 seconds   Dg Chest Port 1 View  Result Date: 12/08/2018 CLINICAL DATA:  Chest pain EXAM: PORTABLE CHEST 1 VIEW COMPARISON:  Chest radiograph 06/10/2013 FINDINGS: Mild cardiomegaly. Basilar predominant bilateral reticulonodular opacities. No large consolidation. No pleural effusion pneumothorax. IMPRESSION: 1. Mild cardiomegaly.  No pulmonary edema. 2. Basilar predominant reticular opacities may be secondary to chronic bronchitis. Electronically Signed   By: Deatra RobinsonKevin  Herman M.D.   On: 12/08/2018 03:38    Review of Systems  Constitutional: Negative for chills and fever.  HENT: Negative for sore throat and tinnitus.   Eyes: Negative for blurred vision and redness.  Respiratory: Negative for cough and shortness of breath.   Cardiovascular: Positive for chest pain. Negative for palpitations, orthopnea and PND.  Gastrointestinal: Positive for nausea. Negative for abdominal pain, diarrhea and vomiting.  Genitourinary: Negative for dysuria, frequency and urgency.  Musculoskeletal: Negative for joint pain and myalgias.  Skin: Negative for rash.       No lesions  Neurological: Negative for speech change, focal weakness and weakness.  Endo/Heme/Allergies: Does not bruise/bleed easily.       No temperature intolerance  Psychiatric/Behavioral: Negative for depression and suicidal ideas.    Blood pressure (!) 153/70, pulse 90, resp. rate 20, height 6\' 1"  (1.854 m), weight (!) 182 kg, SpO2 97 %. Physical Exam  Vitals  reviewed. Constitutional: He is oriented to person, place, and time. He appears well-developed and well-nourished. No distress.  HENT:  Head: Normocephalic and atraumatic.  Mouth/Throat: Oropharynx is clear and moist.  Eyes: Pupils are equal, round, and reactive to light. Conjunctivae and EOM are normal. No scleral icterus.  Neck: Normal range of motion. Neck supple. No JVD present. No tracheal deviation present. No thyromegaly present.  Cardiovascular: Normal rate, regular rhythm and normal heart sounds. Exam reveals no gallop and no friction rub.  No murmur heard.  Respiratory: Effort normal. No respiratory distress. He has wheezes.  GI: Soft. Bowel sounds are normal. He exhibits no distension. There is no abdominal tenderness.  Genitourinary:    Genitourinary Comments: Deferred   Musculoskeletal: Normal range of motion.        General: No edema.  Lymphadenopathy:    He has no cervical adenopathy.  Neurological: He is alert and oriented to person, place, and time. No cranial nerve deficit.  Skin: Skin is warm and dry. No rash noted. No erythema.  Psychiatric: He has a normal mood and affect. His behavior is normal. Judgment and thought content normal.     Assessment/Plan This is a 67 year old male admitted for STEMI. 1.  STEMI: Stents to RCA and mid LAD.  Now chest pain-free.  Continue Aggrastat per post-cath order set. 2.  CAD: Continue aspirin.  Start Brilinta 3.  Hypertension: Uncontrolled; continue lisinopril and metoprolol.  Labetalol and hydralazine as needed. 4.  Metabolic syndrome: A1c was 5.8; recheck.  Sliding scale insulin while hospitalized 5.  Hyperlipidemia: Continue statin therapy 6.  Tobacco abuse: NicoDerm patch while hospitalized.  Counseled patient on smoking cessation 7.  COPD: The patient is actively wheezing; albuterol every 4 hours.  Start Spiriva. 8.  Morbid obesity: BMI is 51.9; encouraged healthy diet and exercise 9.  DVT prophylaxis: As above.  Lovenox once  Aggrastat complete 10.  GI prophylaxis: None The patient is a full code.  I have personally spent 45 minutes in critical care time with this patient.   Arnaldo Natal, MD 12/08/2018, 6:14 AM

## 2018-12-08 NOTE — Progress Notes (Addendum)
Good day. No chest pain reported today. Up in chair without help. Diane (stemi nurse) in to educate patient. Rested most of today. Patient moved to room ICU 18 for staffing purposes. Wife called and informed of room change.

## 2018-12-08 NOTE — Progress Notes (Signed)
eLink Physician-Brief Progress Note Patient Name: Thomas BodilyRichard Douglas [place] DOB: 11/21/1951 MRN: [id number]   Date of Service  12/08/2018  HPI/Events of Note  67 y/o obese, smoker, presented with chest pain, inferior STEMI on EKG underwent successful PCI to RCA and mid LAD via right groin. Patient claims negative for sleep apnea with test done 2 years ago.  eICU Interventions   Post cath care, monitor RLE pulse and sign of bleed  Smoking cessation and lifestyle modification counselling  Retrieve sleep study     Intervention Category Evaluation Type: New Patient Evaluation(inferior STEMI)  Irving BurtonEmily T Geovanny Sartin 12/08/2018, 6:27 AM

## 2018-12-08 NOTE — Care Management Note (Signed)
Case Management Note  Patient Details  Name: Thomas BodilyRichard Douglas [place] MRN: [id number] Date of Birth: 09/22/1951  Subjective/Objective:   Patient admitted with STEMI- status post coronary stent placement.  RNCM consulted for medication assistance.  Patient will need to start Brilinta at discharge.  Brilinta 30 day free coupon given to patient and with a Medicare advantage plan the medication there after should be $45 or less per month.   Robbie LisJeanna Jameelah Watts RN BSN  [phone]                  Action/Plan:   Expected Discharge Date:  12/09/18               Expected Discharge Plan:  Home/Self Care  In-House Referral:     Discharge planning Services  CM Consult  Post Acute Care Choice:    Choice offered to:     DME Arranged:    DME Agency:     HH Arranged:    HH Agency:     Status of Service:  Completed, signed off  If discussed at [employer] Length of Stay Meetings, dates discussed:    Additional Comments:  Allayne ButcherJeanna M Donnie Gedeon, RN 12/08/2018, 4:46 PM

## 2018-12-08 NOTE — Plan of Care (Signed)
Nutrition Education Note  RD consulted for nutrition education regarding a Heart Healthy diet.   67 year old morbidly obese male with past medical history significant for COPD not on home oxygen, hypertension, diabetes, history of TIA presents from home secondary to chest pain and noted to have STEMI.  Lipid Panel     Component Value Date/Time   CHOL 163 12/08/2018 1138   CHOL 200 06/11/2013 0404   TRIG 121 12/08/2018 1138   TRIG 135 06/11/2013 0404   HDL 34 (L) 12/08/2018 1138   HDL 40 06/11/2013 0404   CHOLHDL 4.8 12/08/2018 1138   VLDL 24 12/08/2018 1138   VLDL 27 06/11/2013 0404   LDLCALC 105 (H) 12/08/2018 1138   LDLCALC 133 (H) 06/11/2013 0404    RD provided "Heart Healthy/Consistent Carbohydrate Nutrition Therapy" handout from the Academy of Nutrition and Dietetics. Reviewed patient's dietary recall. Provided examples on ways to decrease sodium and fat intake in diet. Discouraged intake of processed foods and use of salt shaker. Encouraged fresh fruits and vegetables as well as whole grain sources of carbohydrates to maximize fiber intake. Teach back method used.  RD offered additional education via [facility]  Expect fair compliance.  Body mass index is 51.92 kg/m. Pt meets criteria for morbid obesity based on current BMI.  Current diet order is HH/CHO controlled, patient is consuming approximately 100% of meals at this time. Labs and medications reviewed. No further nutrition interventions warranted at this time. RD contact information provided. If additional nutrition issues arise, please re-consult RD.  Betsey Holidayasey Dorothia Passmore MS, RD, LDN Pager #- [phone] Office#- [phone] After Hours Pager: [phone]

## 2018-12-08 NOTE — Progress Notes (Signed)
Cardiovascular and Pulmonary Nurse Navigator Note:     67 year old morbidly obese male with PMHx significant for COPD (not on home oxygen), HTN, DM, TIA, who presented from home to ED today with c/o chest pain.  Patient ruled in for STEMI and was emergently taken to the Cardiac Cath Lab.   Thomas Tanner  CARDIAC CATHETERIZATION  Order# [id number]  Reading physician: Yolonda Kida, MD Ordering physician: Yolonda Kida, MD Study date: 12/08/18  Physicians   Panel Physicians Referring Physician Case Authorizing Physician  Yolonda Kida, MD (Primary)    Procedures   Coronary/Graft Acute MI Revascularization  LEFT HEART CATH AND CORONARY ANGIOGRAPHY  Conclusion     Mid RCA lesion is 99% stenosed.  Mid Cx lesion is 50% stenosed.  Mid LAD lesion is 90% stenosed.  Post intervention, there is a 0% residual stenosis.  A stent was successfully placed.  Post intervention, there is a 0% residual stenosis.  A stent was successfully placed.  Successful DES stent to mid RCA as well as mid LAD both with DES   STEMI presentation EKG suggestive of possible inferior changes Diagnostic cardiac cath showed depressed overall left ventricular function with anterior apical hypokinesis ejection fraction around 45% Coronaries suggested 99% mid RCA successful PCI and stent with DES 3.523 Xience Sierra postdilated with a 3.75 [place] trek to 18 atm TIMI-3 flow before and after Mid LAD 80% lesion with TIMI II flow in the vein PCI and stented with a 2.75 x 18 mm Xience Sierra reducing the lesion from 80 down to 0% TIMI II flow  to TIMI-3 flow Unsuccessful minx deployment Mild right leg groin oozing Requiring manual hold Angiomax reduce infusion for additional 2 hours was discontinued early because of groin oozing. Hemodynamically stable Successful PCI and stent to lesions LAD RCA STEMI presentation Currently on aspirin and Brilinta    Echo has been ordered / pending.     Education:   Patient lying in bed watching TV.    "Heart Attack Bouncing Back" booklet given and reviewed with patient. Discussed the definition of CAD. Reviewed the location of CAD and where his stent was placed. Informed patient he will be given a stent card. Explained the purpose of the stent card. Instructed patient to keep stent card in his wallet.  ? Discussed modifiable risk factors including controlling blood pressure, cholesterol, and blood sugar; following heart healthy diet; maintaining healthy weight; exercise; and smoking cessation.  ? Discussed cardiac medications including rationale for taking, mechanisms of action, and side effects. Stressed the importance of taking medications as prescribed.  ? Discussed emergency plan for heart attack symptoms. Patient verbalized understanding of need to call 911 and not to drive himself to ER if having cardiac symptoms / chest pain.  ? Heart healthy diet of low sodium, low fat, low cholesterol heart healthy / carb modified diet discussed. Information on diet provided. Note: Diet education has been completed by Dietitian today.   ? Smoking Cessation - Patient is a current every day smoker.  Patient did not provide a clear answer when this RN asked about the number of cigarettes he smokes per day.   Patient stated, "I know that I will not be able to quit cold [place].  I want to quit and need to quit, but will not be able to just quit today."  Information on smoking cessation reviewed with patient including the informational sheet, "Thinking about Quitting - Yes, You Can."  Nicotine Patch ordered by patient's  MD currently.   ? Exercise - Patient informed this RN that he walks at times.  Upon asking patient how much or how far and for how long - patient replied, "Sometimes I can walk 2 blockd without stopping and other times I cannot.  I have problems with right sided low back pain and pain in my right hip that limits my activity."    Benefits of  exercised discussed. Informed patient that his cardiologist has referred him to outpatient Cardiac Rehab. An overview of the program was provided. Brochure, informational letter, class and orientation times, and CPT billing codes given to patient. Patient stated he would think about participating in Cardiac Rehab.  Patient agreeable to Cardiac Rehab dept contacting him by phone within one week of discharge.     Patient appreciative of the information.  ?  Roanna Epley, RN, BSN, [place]  [facility] Cardiac & Pulmonary Rehab  Cardiovascular & Pulmonary Nurse Navigator  Direct Line: [phone]  Department Phone #: [phone] Fax: [phone]  Email Address: [email or web address]

## 2018-12-08 NOTE — ED Notes (Signed)
Call placed to cath lab, cath lab not ready for pt at this time.

## 2018-12-08 NOTE — ED Notes (Signed)
Dr. Juliann Parescallwood here.

## 2018-12-08 NOTE — ED Notes (Signed)
Pt to cath lab.

## 2018-12-08 NOTE — Progress Notes (Signed)
Chaplain responded to a Code stemi. Chaplain offered prayer and escorted wife to special recovery while pt has is in cath lab. Chaplain will monitor.    12/08/18 0400  Clinical Encounter Type  Visited With Patient and family together  Visit Type Code  Referral From Nurse  Spiritual Encounters  Spiritual Needs Prayer;Emotional

## 2018-12-08 NOTE — ED Notes (Signed)
Pt placed on oxygen at 2 lpm via n/c.

## 2018-12-09 ENCOUNTER — Inpatient Hospital Stay
Admit: 2018-12-09 | Discharge: 2018-12-09 | Disposition: A | Discharge status: Home / Self Care | Payer: Medicare HMO | Attending: Internal Medicine | Admitting: Internal Medicine

## 2018-12-09 DIAGNOSIS — I213 ST elevation (STEMI) myocardial infarction of unspecified site: Secondary | ICD-10-CM

## 2018-12-09 LAB — CBC
HCT: 45.5 % (ref 39.0–52.0)
Hemoglobin: 15.3 g/dL (ref 13.0–17.0)
MCH: 31.3 pg (ref 26.0–34.0)
MCHC: 33.6 g/dL (ref 30.0–36.0)
MCV: 93 fL (ref 80.0–100.0)
Platelets: 247 10*3/uL (ref 150–400)
RBC: 4.89 MIL/uL (ref 4.22–5.81)
RDW: 13.4 % (ref 11.5–15.5)
WBC: 9.5 10*3/uL (ref 4.0–10.5)
nRBC: 0 % (ref 0.0–0.2)

## 2018-12-09 LAB — GLUCOSE, CAPILLARY
Glucose-Capillary: 147 mg/dL — ABNORMAL HIGH (ref 70–99)
Glucose-Capillary: 99 mg/dL (ref 70–99)
Glucose-Capillary: 107 mg/dL — ABNORMAL HIGH (ref 70–99)
Glucose-Capillary: 96 mg/dL (ref 70–99)

## 2018-12-09 LAB — BASIC METABOLIC PANEL
Anion gap: 11 (ref 5–15)
BUN: 22 mg/dL (ref 8–23)
CO2: 26 mmol/L (ref 22–32)
Calcium: 9.1 mg/dL (ref 8.9–10.3)
Chloride: 99 mmol/L (ref 98–111)
Creatinine, Ser: 1.26 mg/dL — ABNORMAL HIGH (ref 0.61–1.24)
GFR calc Af Amer: >60 mL/min (ref >60)
GFR calc non Af Amer: 59 mL/min — ABNORMAL LOW (ref >60)
Glucose, Bld: 116 mg/dL — ABNORMAL HIGH (ref 70–99)
Potassium: 3.6 mmol/L (ref 3.5–5.1)
Sodium: 136 mmol/L (ref 135–145)

## 2018-12-09 MED ORDER — PERFLUTREN LIPID MICROSPHERE
1.0000 mL | INTRAVENOUS | Status: AC | PRN
  Administered 2018-12-09: 2 mL via INTRAVENOUS
  Filled 2018-12-09: qty 10

## 2018-12-09 NOTE — Progress Notes (Signed)
[employer] [facility] [place] [facility]   PATIENT NAME: Thomas Tanner    MR#:  [id number]  DATE OF BIRTH:  09/03/1951  SUBJECTIVE:  CHIEF COMPLAINT:   Chief Complaint  Patient presents with  . Chest Pain   -Very obese gentleman.  Admitted with chest pain and noted to have STEMI-status post PCI to RCA and left anterior descending. -Doing well now.  Heart rate still not well controlled  REVIEW OF SYSTEMS:  Review of Systems  Constitutional: Negative for chills, fever and malaise/fatigue.  HENT: Negative for congestion, ear discharge, hearing loss and nosebleeds.   Eyes: Negative for blurred vision and double vision.  Respiratory: Positive for shortness of breath. Negative for cough and wheezing.   Cardiovascular: Positive for chest pain. Negative for palpitations.  Gastrointestinal: Negative for abdominal pain, constipation, diarrhea, nausea and vomiting.  Genitourinary: Negative for dysuria.  Musculoskeletal: Negative for myalgias.  Neurological: Negative for dizziness, focal weakness, seizures, weakness and headaches.  Psychiatric/Behavioral: Negative for depression.    DRUG ALLERGIES:   Allergies  Allergen Reactions  . Morphine And Related Anxiety    VITALS:  Blood pressure 114/75, pulse 90, temperature 98 F (36.7 C), temperature source Oral, resp. rate 16, height 6\' 1"  (1.854 m), weight (!) 178.5 kg, SpO2 94 %.  PHYSICAL EXAMINATION:  Physical Exam   GENERAL:  67 y.o.-year-old morbidly obese patient lying in the bed with no acute distress.  EYES: Pupils equal, round, reactive to light and accommodation. No scleral icterus. Extraocular muscles intact.  HEENT: Head atraumatic, normocephalic. Oropharynx and nasopharynx clear.  NECK:  Supple, no jugular venous distention. No thyroid enlargement, no tenderness.  LUNGS: Normal breath sounds bilaterally, occasional scattered wheezes, no rales,rhonchi or crepitation. No use of accessory muscles of  respiration.  Decreased bibasilar breath sounds cARDIOVASCULAR: S1, S2 normal. No murmurs, rubs, or gallops.  ABDOMEN: Soft, obese abdomen, nontender, nondistended. Bowel sounds present. No organomegaly or mass.  EXTREMITIES: No  cyanosis, or clubbing.  1+ bilateral lower extremity edema Right groin catheterization site with minimal bloody discharge NEUROLOGIC: Cranial nerves II through XII are intact. Muscle strength 5/5 in all extremities. Sensation intact. Gait not checked.  PSYCHIATRIC: The patient is alert and oriented x 3.  SKIN: No obvious rash, lesion, or ulcer.    LABORATORY PANEL:   CBC Recent Labs  Lab 12/09/18 0321  WBC 9.5  HGB 15.3  HCT 45.5  PLT 247   ------------------------------------------------------------------------------------------------------------------  Chemistries  Recent Labs  Lab 12/08/18 0657 12/09/18 0321  NA 134* 136  K 4.2 3.6  CL 98 99  CO2 26 26  GLUCOSE 142* 116*  BUN 22 22  CREATININE 1.23 1.26*  CALCIUM 9.1 9.1  AST 37  --   ALT 15  --   ALKPHOS 120  --   BILITOT 0.7  --    ------------------------------------------------------------------------------------------------------------------  Cardiac Enzymes Recent Labs  Lab 12/08/18 1722  TROPONINI 15.44*   ------------------------------------------------------------------------------------------------------------------  RADIOLOGY:  Dg Chest Port 1 View  Result Date: 12/08/2018 CLINICAL DATA:  Chest pain EXAM: PORTABLE CHEST 1 VIEW COMPARISON:  Chest radiograph 06/10/2013 FINDINGS: Mild cardiomegaly. Basilar predominant bilateral reticulonodular opacities. No large consolidation. No pleural effusion pneumothorax. IMPRESSION: 1. Mild cardiomegaly.  No pulmonary edema. 2. Basilar predominant reticular opacities may be secondary to chronic bronchitis. Electronically Signed   By: Deatra RobinsonKevin  Herman M.D.   On: 12/08/2018 03:38    EKG:   Orders placed or performed during the hospital  encounter of 12/08/18  . Repeat  EKG  . Repeat EKG  . EKG 12-Lead immediately post procedure  . EKG 12-Lead  . EKG 12-Lead immediately post procedure  . EKG 12-Lead  . EKG 12-Lead  . EKG 12-Lead    ASSESSMENT AND PLAN:   67 year old morbidly obese male with past medical history significant for COPD not on home oxygen, hypertension, diabetes, history of TIA presents from home secondary to chest pain and noted to have STEMI.  1.  STEMI-anterior and inferior STEMI, status post cardiac catheterization and has PCI done to RCA and LAD. -Left circumflex with 50% blockage. -Started on aspirin, Brilinta, statin, metoprolol and lisinopril -Appreciate cardiology consult. -Symptomatically improving.  Follow-up echocardiogram.   -[place] to telemetry today. -weight Reduction and lifestyle modification recommended. - cardiac rehab at discharge - 2.  Diabetes mellitus-continue on sliding scale insulin -A1c is 5.8  3.  COPD-minimal scattered expiratory wheeze.  No indication for systemic steroids.  Continue inhalers and PRN nebulizers  4.  Hypertension-continue metoprolol and lisinopril -Metoprolol dose adjusted to help with his tachycardia  5.  DVT prophylaxis-patient is on Lovenox  Doing well.  Anticipate discharge home tomorrow if no complications.   All the records are reviewed and case discussed with Care Management/Social Workerr. Management plans discussed with the patient, family and they are in agreement.  CODE STATUS: Full code  TOTAL TIME TAKING CARE OF THIS PATIENT: 38 minutes.   POSSIBLE D/C tomorrow, DEPENDING ON CLINICAL CONDITION.   Enid Baasadhika Taeshawn Helfman M.D on 12/09/2018 at 11:54 AM  Between 7am to 6pm - Pager - [phone]  After 6pm go to [email or web address] - Social research officer, governmentpassword EPAS [facility]  [facility] [place] [facility]  Office  [phone]  CC: Primary care physician; Carlean JewsBoscia, Heather E, NP

## 2018-12-09 NOTE — Progress Notes (Signed)
Follow up - Critical Care Medicine Note  Patient Details:    Thomas Tanner is an 67 y.o. male.With a past medical history remarkable for hypertension, hyperlipidemia, COPD, diabetes, presented to the emergency department with complaints of chest pain, beginning substernally and subsequent left shoulder radiation.  Patient also complains of nausea.  In emergency department he was noted to have STEMI, was subsequently taken to the cath lab, PCI/DES was performed, stents to RCA and mid LAD  Lines, Airways, Drains:    Anti-infectives:  Anti-infectives (From admission, onward)   None      Microbiology: Results for orders placed or performed during the hospital encounter of 12/08/18  MRSA PCR Screening     Status: None   Collection Time: 12/08/18  6:49 AM  Result Value Ref Range Status   MRSA by PCR NEGATIVE NEGATIVE Final    Comment:        The GeneXpert MRSA Assay (FDA approved for NASAL specimens only), is one component of a comprehensive MRSA colonization surveillance program. It is not intended to diagnose MRSA infection nor to guide or monitor treatment for MRSA infections. Performed at [facility], [address] [place], [place] [address]     Studies: [facility]  Result Date: 12/08/2018 CLINICAL DATA:  Chest pain EXAM: PORTABLE CHEST 1 VIEW COMPARISON:  Chest radiograph 06/10/2013 FINDINGS: Mild cardiomegaly. Basilar predominant bilateral reticulonodular opacities. No large consolidation. No pleural effusion pneumothorax. IMPRESSION: 1. Mild cardiomegaly.  No pulmonary edema. 2. Basilar predominant reticular opacities may be secondary to chronic bronchitis. Electronically Signed   By: Deatra RobinsonKevin  Herman M.D.   On: 12/08/2018 03:38    Consults:    Subjective:    Overnight Issues: Patient doing well, pain-free, no hemodynamic instability or arrhythmias  Objective:  Vital signs for last 24 hours: Temp:  [98 F (36.7 C)-98.4 F (36.9 C)]  98 F (36.7 C) (12/31 0730) Pulse Rate:  [84-107] 91 (12/31 0730) Resp:  [13-24] 16 (12/31 0800) BP: (89-152)/(60-104) 114/104 (12/31 0800) SpO2:  [92 %-97 %] 94 % (12/30 1800)  Hemodynamic parameters for last 24 hours:    Intake/Output from previous day: 12/30 0701 - 12/31 0700 In: 1093 [P.O.:1090; I.V.:3] Out: 4735 [Urine:4735]  Intake/Output this shift: Total I/O In: -  Out: 200 [Urine:200]  Vent settings for last 24 hours:    Physical Exam:  Vital signs:       Please see the above listed vital signs HEENT:           Trachea midline, no thyromegaly appreciated, no oral lesions noted or jugular venous distention Cardiovascular:           Stable hemodynamics, regular rate and rhythm Pulmonary:      Clear to auscultation Abdominal:      Positive bowel sounds, soft exam Extremities:     Adequate pulses in the right lower extremity, no clubbing, cyanosis or edema noted Neurologic:      No focal deficits noted  Assessment/Plan:  STEMI.  Status post PCI/DES to RCA and LAD.  Being managed by cardiology.  Is on dual antiplatelet agents, beta-blockade, statin.  Stable hemodynamics at this time  COPD.  Bronchospasm improving.  Agree with Spiriva with rescue albuterol  Hypertension.  On lisinopril and metoprolol with as needed hydralazine and labetalol  Stable for telemetry transfer versus D/C today. Pending cardiolog  Tora KindredJohn Joseff Luckman, DO Joaquim Tolen 12/09/2018  *Care during the described time interval was provided by me and/or other providers on the  critical care team.  I have reviewed this patient's available data, including medical history, events of note, physical examination and test results as part of my evaluation.

## 2018-12-09 NOTE — Progress Notes (Signed)
*  PRELIMINARY RESULTS* Echocardiogram 2D Echocardiogram has been performed. Definity image enhancer was administered.  Joanette GulaJoan M Andres Vest 12/09/2018, 9:55 AM

## 2018-12-10 LAB — ECHOCARDIOGRAM COMPLETE
Height: 73 in
Weight: 6296.34 oz

## 2018-12-10 LAB — BASIC METABOLIC PANEL
Anion gap: 8 (ref 5–15)
BUN: 26 mg/dL — ABNORMAL HIGH (ref 8–23)
CO2: 26 mmol/L (ref 22–32)
Calcium: 8.8 mg/dL — ABNORMAL LOW (ref 8.9–10.3)
Chloride: 100 mmol/L (ref 98–111)
Creatinine, Ser: 1.25 mg/dL — ABNORMAL HIGH (ref 0.61–1.24)
GFR calc Af Amer: >60 mL/min (ref >60)
GFR calc non Af Amer: >60 mL/min (ref >60)
Glucose, Bld: 106 mg/dL — ABNORMAL HIGH (ref 70–99)
Potassium: 4 mmol/L (ref 3.5–5.1)
Sodium: 134 mmol/L — ABNORMAL LOW (ref 135–145)

## 2018-12-10 LAB — GLUCOSE, CAPILLARY
Glucose-Capillary: 97 mg/dL (ref 70–99)
Glucose-Capillary: 121 mg/dL — ABNORMAL HIGH (ref 70–99)

## 2018-12-10 MED ORDER — ATORVASTATIN CALCIUM 80 MG PO TABS: 80.0000 mg | ORAL_TABLET | Freq: Every day | ORAL | 0 refills | Status: DC

## 2018-12-10 MED ORDER — ASPIRIN 81 MG PO CHEW: 81.0000 mg | CHEWABLE_TABLET | Freq: Every day | ORAL | 0 refills | Status: DC

## 2018-12-10 MED ORDER — TICAGRELOR 90 MG PO TABS: 90.0000 mg | ORAL_TABLET | Freq: Two times a day (BID) | ORAL | 0 refills | Status: DC

## 2018-12-10 MED ORDER — METFORMIN HCL 500 MG PO TABS: 500.0000 mg | ORAL_TABLET | Freq: Two times a day (BID) | ORAL | 0 refills | Status: DC

## 2018-12-10 MED ORDER — NICOTINE 14 MG/24HR TD PT24: 14.0000 mg | MEDICATED_PATCH | Freq: Every day | TRANSDERMAL | 0 refills | Status: DC

## 2018-12-10 NOTE — Progress Notes (Signed)
Pt transferred from ICU via wheelchair. Pt A&O. No complaints of pain at this time. Oriented to room. Will continue to monitor.

## 2018-12-10 NOTE — Discharge Instructions (Signed)
Angina ° °Angina is extreme discomfort in the chest, neck, arm, jaw or back. The discomfort is caused by a lack of blood in the middle layer of the heart wall (myocardium). °There are four types of angina: °· Stable angina. This is triggered by vigorous activity or exercise. It goes away when you rest or take angina medicine. °· Unstable angina. This is a warning sign and can lead to a heart attack (acute coronary syndrome). This is a medical emergency. Symptoms come at rest and last a long time. °· Microvascular angina. This affects the small coronary arteries. Symptoms include feeling tired and being short of breath. °· Prinzmetal or variant angina. This is caused by a tightening (spasm) of the arteries that go to your heart. °What are the causes? °This condition is caused by atherosclerosis. This is the buildup of fat and cholesterol (plaque) in your arteries. The plaque may narrow or block the artery. °Other causes include: °· Sudden tightening of the muscles of the arteries in the heart (coronary spasm). °· Small artery disease (microvascular dysfunction). °· Problems with any of your heart valves (heart valve disease). °· A tear in an artery in your heart (coronary artery dissection). °· Cardiomyopathy, or other heart disease. °What increases the risk? °You are more likely to develop this condition if you have: °· High cholesterol. °· High blood pressure. °· Diabetes. °· Family history of heart disease. °· Inactive (sedentary) lifestyle, or you do not exercise enough. °· Depression. °· Had radiation to the left side of your chest. °Other risk factors include: °· Using tobacco. °· Being obese. °· Eating a diet high in saturated fats. °· Being exposed to high stress or triggers of stress. °· Using drugs, such as cocaine. °Women have a greater risk for angina if: °· They are older than 55. °· They have gone through menopause (postmenopausal). °What are the signs or symptoms? °Common symptoms in both men and women  may include: °· Chest pain, which may: °? Feel like a crushing or squeezing in the chest, or a tightness, pressure, fullness, or heaviness in the chest. °? Last for more than a few minutes at a time, or it may stop and come back (recur) over the course of a few minutes. °· Pain in the neck, arm, jaw, or back. °· Unexplained heartburn or indigestion. °· Shortness of breath. °· Nausea. °· Sudden cold sweats. °Women and people with diabetes may have unusual (atypical) symptoms, such as: °· Fatigue. °· Unexplained feelings of nervousness or anxiety. °· Unexplained weakness. °· Dizziness or fainting. °How is this diagnosed? °This condition may be diagnosed based on: °· Your symptoms and medical history. °· Electrocardiogram (ECG) to measure the electrical activity in your heart. °· Blood tests. °· Stress test to look for signs of blockage when your heart is stressed. °· CT angiogram to examine your heart and the blood flow to it. °· Coronary angiogram to check your coronary arteries for blockage. °How is this treated? °Angina may be treated with: °· Medicines to: °? Prevent blood clots and heart attack. °? Relax blood vessels and improve blood flow to the heart (nitrates). °? Reduce blood pressure, improve the pumping action of the heart, and relax blood vessels that are spasming. °? Reduce cholesterol and help treat atherosclerosis. °· A procedure to widen a narrowed or blocked coronary artery (angioplasty). A mesh tube may be placed in a coronary artery to keep it open (coronary stenting). °· Surgery to allow blood to go around a blocked artery (  coronary artery bypass surgery). °Follow these instructions at home: °Medicines °· Take over-the-counter and prescription medicines only as told by your health care provider. °· Do not take the following medicines unless your health care provider approves: °? NSAIDs, such as ibuprofen, naproxen, or celecoxib. °? Vitamin supplements that contain vitamin A, vitamin E, or  both. °? Hormone replacement therapy that contains estrogen with or without progestin. °Eating and drinking ° °· Eat a heart-healthy diet. This includes plenty of fresh fruits and vegetables, whole grains, low-fat (lean) protein, and low-fat dairy products. °· Follow instructions from your health care provider about eating or drinking restrictions. °Activity °· Follow an exercise program approved by your health care provider. Join a cardiac rehabilitation program. °· Take a break when you feel fatigued. Plan rest periods in your daily activities. °Lifestyle ° °· Do not use any products that contain nicotine or tobacco, such as cigarettes and e-cigarettes. If you need help quitting, ask your health care provider. °· If your health care provider approves, limit alcohol intake to no more than 1 drink a day for women and 2 drinks a day for men. One drink equals 12 oz of beer, 5 oz of wine, or 1½ oz of hard liquor. °General instructions °· Maintain a healthy weight. °· Learn to manage stress. °· Keep your vaccinations up to date. Get the flu (influenza) vaccine every year. °· Talk to your health care provider if you feel depressed. Take a depression screening test to see if you are at risk for depression. °· Work with your health care provider to manage other health conditions, such as hypertension or diabetes. °· Keep all follow-up visits as told by your health care provider. This is important. °Get help right away if: °· You have pain in your chest, neck, arm, jaw, or back, and the pain: °? Lasts more than a few minutes. °? Is recurring. °? Is not relieved by taking medicines under the tongue (sublingual nitroglycerin). °? Increases in intensity or frequency. °· You have a lot of sweating without cause. °· You have unexplained: °? Heartburn or indigestion. °? Shortness of breath or difficulty breathing. °? Nausea or vomiting. °? Fatigue. °? Feelings of nervousness or anxiety. °? Weakness. °· You have sudden  light-headedness or dizziness. °· You faint. °These symptoms may represent a serious problem that is an emergency. Do not wait to see if the symptoms will go away. Get medical help right away. Call your local emergency services (911 in the [place] Do not drive yourself to the hospital. °Summary °· Angina is extreme discomfort in the chest, neck, or arm that is caused by a lack of blood in the heart wall. °· There are many symptoms of angina. They include chest pain or pain in the arms, neck, jaw, or back. °· Angina may be treated with behavioral changes, medicine, or surgery. °· Symptoms of angina may represent an emergency. Get medical help right away. Call your local emergency services (911 in the [place] Do not drive yourself to the hospital. °This information is not intended to replace advice given to you by your health care provider. Make sure you discuss any questions you have with your health care provider. °Document Released: 11/26/2005 Document Revised: 01/10/2018 Document Reviewed: 01/10/2018 °Elsevier Interactive Patient Education © 2019 [employer] Inc. ° °

## 2018-12-10 NOTE — Discharge Summary (Signed)
[facility]   PATIENT NAME: Thomas Tanner    MR#:  [id number]  DATE OF BIRTH:  May 31, 1951  DATE OF ADMISSION:  12/08/2018 ADMITTING PHYSICIAN: Alwyn Pea, MD  DATE OF DISCHARGE: No discharge date for patient encounter.  PRIMARY CARE PHYSICIAN: Carlean Jews, NP    ADMISSION DIAGNOSIS:  ST elevation myocardial infarction (STEMI), unspecified artery (HCC) [I21.3] STEMI (ST elevation myocardial infarction) (HCC) [I21.3]  DISCHARGE DIAGNOSIS:  Active Problems:   STEMI (ST elevation myocardial infarction) (HCC)   SECONDARY DIAGNOSIS:   Past Medical History:  Diagnosis Date  . Arthritis   . COPD (chronic obstructive pulmonary disease) (HCC)   . Diabetes (HCC)   . Hyperlipidemia   . Hypertension   . Kidney stone   . Obesity   . Tachycardia   . TIA (transient ischemic attack)     HOSPITAL COURSE:   68 year old morbidly obese male with past medical history significant for COPD not on home oxygen, hypertension, diabetes, history of TIA presents from home secondary to chest pain and noted to have STEMI.  1.  STEMI-anterior and inferior STEMI, status post cardiac catheterization and has PCI done to RCA and LAD. -Left circumflex with 50% blockage. -Started on aspirin, Brilinta, statin, metoprolol, lisinopril, statin therapy, cardiology following while in-house-recommended follow-up in 10 days for reevaluation status post discharge, ejection fraction 45-50%/hypokinesis, and patient did well   2.  Diabetes mellitus -A1c is 5.8, at goal  3.  COPD without exacerbation -Provided breathing treatments PRN   4.  Hypertension -Controlled on current regiment DISCHARGE CONDITIONS:   stable  CONSULTS OBTAINED:    DRUG ALLERGIES:   Allergies  Allergen Reactions  . Morphine And Related Anxiety    DISCHARGE MEDICATIONS:   Allergies as of 12/10/2018      Reactions   Morphine And Related Anxiety       Medication List    STOP taking these medications   aspirin 325 MG tablet Replaced by:  aspirin 81 MG chewable tablet     TAKE these medications   aspirin 81 MG chewable tablet Chew 1 tablet (81 mg total) by mouth daily. Start taking on:  December 11, 2018 Replaces:  aspirin 325 MG tablet   atorvastatin 80 MG tablet Commonly known as:  LIPITOR Take 1 tablet (80 mg total) by mouth daily at 6 PM.   cyclobenzaprine 10 MG tablet Commonly known as:  FLEXERIL Take 1 tablet (10 mg total) by mouth 2 (two) times daily as needed for muscle spasms.   glucose blood test strip 1 each by Other route as needed for other. Use as instructed   lisinopril-hydrochlorothiazide 20-12.5 MG tablet Commonly known as:  PRINZIDE,ZESTORETIC Take 1 tablet by mouth daily.   meloxicam 7.5 MG tablet Commonly known as:  MOBIC Take 7.5 mg by mouth daily.   metFORMIN 500 MG tablet Commonly known as:  GLUCOPHAGE Take 1 tablet (500 mg total) by mouth 2 (two) times daily with a meal.   metoprolol succinate 50 MG 24 hr tablet Commonly known as:  TOPROL-XL Take 1 tablet (50 mg total) by mouth daily.   nicotine 14 mg/24hr patch Commonly known as:  NICODERM CQ - dosed in mg/24 hours Place 1 patch (14 mg total) onto the skin daily. Start taking on:  December 11, 2018   Omega-3 500 MG Caps Take by mouth. Take 1 tab po daily   omeprazole 40 MG capsule Commonly known as:  PRILOSEC Take 1 capsule (  40 mg total) by mouth daily.   ONETOUCH DELICA LANCETS 33G Misc by Does not apply route.   ticagrelor 90 MG Tabs tablet Commonly known as:  BRILINTA Take 1 tablet (90 mg total) by mouth 2 (two) times daily.   triamcinolone cream 0.5 % Commonly known as:  KENALOG Apply 1 application topically 2 (two) times daily.   VENTOLIN HFA 108 (90 Base) MCG/ACT inhaler Generic drug:  albuterol Inhale 2 puffs into the lungs every 6 (six) hours as needed for wheezing or shortness of breath.        DISCHARGE  INSTRUCTIONS:      If you experience worsening of your admission symptoms, develop shortness of breath, life threatening emergency, suicidal or homicidal thoughts you must seek medical attention immediately by calling 911 or calling your MD immediately  if symptoms less severe.  You Must read complete instructions/literature along with all the possible adverse reactions/side effects for all the Medicines you take and that have been prescribed to you. Take any new Medicines after you have completely understood and accept all the possible adverse reactions/side effects.   Please note  You were cared for by a hospitalist during your hospital stay. If you have any questions about your discharge medications or the care you received while you were in the hospital after you are discharged, you can call the unit and asked to speak with the hospitalist on call if the hospitalist that took care of you is not available. Once you are discharged, your primary care physician will handle any further medical issues. Please note that NO REFILLS for any discharge medications will be authorized once you are discharged, as it is imperative that you return to your primary care physician (or establish a relationship with a primary care physician if you do not have one) for your aftercare needs so that they can reassess your need for medications and monitor your lab values.    Today   CHIEF COMPLAINT:   Chief Complaint  Patient presents with  . Chest Pain    HISTORY OF PRESENT ILLNESS:   The patient with past medical history of COPD, hypertension and diabetes presents to the emergency department complaining of chest pain.  The pain began at rest and was sharp in character.  The patient reports that it began substernally but radiated some toward his left shoulder.  He remembers feeling nauseous but he did not vomit.  He cannot recall if he experienced shortness of breath but admits to some diaphoresis.  In the  emergency department an EKG showed ST elevations which prompted activation of the cath team who took the patient for revascularization prior to the hospitalist service being called for admission.  VITAL SIGNS:  Blood pressure (!) 106/57, pulse 90, temperature 98 F (36.7 C), temperature source Oral, resp. rate 18, height 6\' 1"  (1.854 m), weight (!) 175 kg, SpO2 91 %.  I/O:    Intake/Output Summary (Last 24 hours) at 12/10/2018 1148 Last data filed at 12/10/2018 1112 Gross per 24 hour  Intake 843 ml  Output 3550 ml  Net -2707 ml    PHYSICAL EXAMINATION:  GENERAL:  68 y.o.-year-old patient lying in the bed with no acute distress.  EYES: Pupils equal, round, reactive to light and accommodation. No scleral icterus. Extraocular muscles intact.  HEENT: Head atraumatic, normocephalic. Oropharynx and nasopharynx clear.  NECK:  Supple, no jugular venous distention. No thyroid enlargement, no tenderness.  LUNGS: Normal breath sounds bilaterally, no wheezing, rales,rhonchi or crepitation. No use  of accessory muscles of respiration.  CARDIOVASCULAR: S1, S2 normal. No murmurs, rubs, or gallops.  ABDOMEN: Soft, non-tender, non-distended. Bowel sounds present. No organomegaly or mass.  EXTREMITIES: No pedal edema, cyanosis, or clubbing.  NEUROLOGIC: Cranial nerves II through XII are intact. Muscle strength 5/5 in all extremities. Sensation intact. Gait not checked.  PSYCHIATRIC: The patient is alert and oriented x 3.  SKIN: No obvious rash, lesion, or ulcer.   DATA REVIEW:   CBC Recent Labs  Lab 12/09/18 0321  WBC 9.5  HGB 15.3  HCT 45.5  PLT 247    Chemistries  Recent Labs  Lab 12/08/18 0657  12/10/18 0504  NA 134*   < > 134*  K 4.2   < > 4.0  CL 98   < > 100  CO2 26   < > 26  GLUCOSE 142*   < > 106*  BUN 22   < > 26*  CREATININE 1.23   < > 1.25*  CALCIUM 9.1   < > 8.8*  AST 37  --   --   ALT 15  --   --   ALKPHOS 120  --   --   BILITOT 0.7  --   --    < > = values in this  interval not displayed.    Cardiac Enzymes Recent Labs  Lab 12/08/18 1722  TROPONINI 15.44*    Microbiology Results  Results for orders placed or performed during the hospital encounter of 12/08/18  MRSA PCR Screening     Status: None   Collection Time: 12/08/18  6:49 AM  Result Value Ref Range Status   MRSA by PCR NEGATIVE NEGATIVE Final    Comment:        The GeneXpert MRSA Assay (FDA approved for NASAL specimens only), is one component of a comprehensive MRSA colonization surveillance program. It is not intended to diagnose MRSA infection nor to guide or monitor treatment for MRSA infections. Performed at [facility], [address] [place], [place] [address]     RADIOLOGY:  No results found.  EKG:   Orders placed or performed during the hospital encounter of 12/08/18  . Repeat EKG  . Repeat EKG  . EKG 12-Lead immediately post procedure  . EKG 12-Lead  . EKG 12-Lead immediately post procedure  . EKG 12-Lead  . EKG 12-Lead  . EKG 12-Lead      Management plans discussed with the patient, family and they are in agreement.  CODE STATUS:     Code Status Orders  (From admission, onward)         Start     Ordered   12/08/18 0635  Full code  Continuous     12/08/18 0634        Code Status History    Date Active Date Inactive Code Status Order ID Comments User Context   12/08/2018 0633 12/08/2018 0634 Full Code [id number]  Alwyn Peaallwood, Dwayne D, MD Inpatient      TOTAL TIME TAKING CARE OF THIS PATIENT: 35minutes.    Evelena AsaMontell D Delvecchio Madole M.D on 12/10/2018 at 11:48 AM  Between 7am to 6pm - Pager - [phone]  After 6pm go to [email or web address] - password [email or web address]  [facility]  Office  [phone]  CC: Primary care physician; Carlean JewsBoscia, Heather E, NP   Note: This dictation was prepared with Dragon dictation along with smaller phrase technology. Any transcriptional errors that result from this process are unintentional.

## 2018-12-10 NOTE — Progress Notes (Signed)
Pt is AOx4, vss, in bed with rails upx2 without any complaints of pain or signs of distress. The right groin site is red, non-tender and without signs of hematoma. The pt is weeping under his skin folds above his groin site. This area was cleaned and dried appropriately. He is without any complaints at this time. We will continue to monitor the pt.

## 2018-12-11 ENCOUNTER — Other Ambulatory Visit: Payer: Self-pay

## 2018-12-11 DIAGNOSIS — I10 Essential (primary) hypertension: Secondary | ICD-10-CM

## 2018-12-11 MED ORDER — METOPROLOL SUCCINATE ER 50 MG PO TB24: 50.0000 mg | ORAL_TABLET | Freq: Every day | ORAL | 5 refills | Status: DC

## 2018-12-15 DIAGNOSIS — K219 Gastro-esophageal reflux disease without esophagitis: Secondary | ICD-10-CM | POA: Diagnosis not present

## 2018-12-15 DIAGNOSIS — J449 Chronic obstructive pulmonary disease, unspecified: Secondary | ICD-10-CM | POA: Diagnosis not present

## 2018-12-15 DIAGNOSIS — M1990 Unspecified osteoarthritis, unspecified site: Secondary | ICD-10-CM | POA: Diagnosis not present

## 2018-12-15 DIAGNOSIS — I2510 Atherosclerotic heart disease of native coronary artery without angina pectoris: Secondary | ICD-10-CM | POA: Diagnosis not present

## 2018-12-15 DIAGNOSIS — I10 Essential (primary) hypertension: Secondary | ICD-10-CM | POA: Diagnosis not present

## 2018-12-15 DIAGNOSIS — E785 Hyperlipidemia, unspecified: Secondary | ICD-10-CM | POA: Diagnosis not present

## 2018-12-15 DIAGNOSIS — I252 Old myocardial infarction: Secondary | ICD-10-CM | POA: Diagnosis not present

## 2018-12-15 DIAGNOSIS — R69 Illness, unspecified: Secondary | ICD-10-CM | POA: Diagnosis not present

## 2018-12-16 ENCOUNTER — Ambulatory Visit (INDEPENDENT_AMBULATORY_CARE_PROVIDER_SITE_OTHER): Payer: Medicare HMO | Admitting: Nurse Practitioner

## 2018-12-16 ENCOUNTER — Encounter: Payer: Self-pay | Admitting: Nurse Practitioner

## 2018-12-16 VITALS — BP 136/62 | HR 89 | Resp 16 | Ht 73.0 in | Wt 388.0 lb

## 2018-12-16 DIAGNOSIS — I2510 Atherosclerotic heart disease of native coronary artery without angina pectoris: Secondary | ICD-10-CM

## 2018-12-16 DIAGNOSIS — F17210 Nicotine dependence, cigarettes, uncomplicated: Secondary | ICD-10-CM

## 2018-12-16 DIAGNOSIS — I213 ST elevation (STEMI) myocardial infarction of unspecified site: Secondary | ICD-10-CM | POA: Diagnosis not present

## 2018-12-16 DIAGNOSIS — I10 Essential (primary) hypertension: Secondary | ICD-10-CM | POA: Diagnosis not present

## 2018-12-16 DIAGNOSIS — R69 Illness, unspecified: Secondary | ICD-10-CM | POA: Diagnosis not present

## 2018-12-16 NOTE — Progress Notes (Signed)
[facility] [address] [place], [place] [address]  Internal MEDICINE  Office Visit Note  Patient Name: Thomas Tanner  [id number]  [id number]  Date of Service: 12/16/2018    Pt is here for recent hospital follow up.    Chief Complaint  Patient presents with  . Medical Management of Chronic Issues    Hospital follow up, heart attack, pt was sent home with metformin and have some conerns about it.     The patient is here for hospital follow up. Was admitted a week ago. Had myocardial infarction. Had two stents placed. Was discharged from the hospital on new year's day. Doing well today. Slightly short of breath.  Has mild, non-productive cough. Was started on two blood pressure medications. Now on metoprolol and lisinopril/HCTZ. He is on brilinta as blood thinner.  Blood pressure well managed. Will see cardiologist on 12/25/2018. Will start physical therapy after he follows up with cardiologist.  Was also discharged with prescription for metformin. Reviewed labs from hospital. Highest blood sugar was 147 while hospitalized. HgbA1c was checked and was 5.4. we have decided to hold off on starting the metformin.     Current Medication: Outpatient Encounter Medications as of 12/16/2018  Medication Sig  . aspirin 81 MG chewable tablet Chew 1 tablet (81 mg total) by mouth daily.  Marland Kitchen atorvastatin (LIPITOR) 80 MG tablet Take 1 tablet (80 mg total) by mouth daily at 6 PM.  . cyclobenzaprine (FLEXERIL) 10 MG tablet Take 1 tablet (10 mg total) by mouth 2 (two) times daily as needed for muscle spasms.  Marland Kitchen glucose blood test strip 1 each by Other route as needed for other. Use as instructed  . Krill Oil (OMEGA-3) 500 MG CAPS Take by mouth. Take 1 tab po daily  . lisinopril-hydrochlorothiazide (PRINZIDE,ZESTORETIC) 20-12.5 MG tablet Take 1 tablet by mouth daily.  . meloxicam (MOBIC) 7.5 MG tablet Take 7.5 mg by mouth daily.  . metoprolol succinate (TOPROL-XL) 50 MG 24 hr tablet  Take 1 tablet (50 mg total) by mouth daily.  . nicotine (NICODERM CQ - DOSED IN MG/24 HOURS) 14 mg/24hr patch Place 1 patch (14 mg total) onto the skin daily.  Marland Kitchen omeprazole (PRILOSEC) 40 MG capsule Take 1 capsule (40 mg total) by mouth daily.  Letta Pate DELICA LANCETS 33G MISC by Does not apply route.  . ticagrelor (BRILINTA) 90 MG TABS tablet Take 1 tablet (90 mg total) by mouth 2 (two) times daily.  Marland Kitchen triamcinolone cream (KENALOG) 0.5 % Apply 1 application topically 2 (two) times daily.  . VENTOLIN HFA 108 (90 Base) MCG/ACT inhaler Inhale 2 puffs into the lungs every 6 (six) hours as needed for wheezing or shortness of breath.   No facility-administered encounter medications on file as of 12/16/2018.     Surgical History: Past Surgical History:  Procedure Laterality Date  . CORONARY/GRAFT ACUTE MI REVASCULARIZATION N/A 12/08/2018   Procedure: Coronary/Graft Acute MI Revascularization;  Surgeon: Alwyn Pea, MD;  Location: [facility] INVASIVE CV LAB;  Service: Cardiovascular;  Laterality: N/A;  . KIDNEY STONE SURGERY    . left arm surgery  1963  . LEFT HEART CATH AND CORONARY ANGIOGRAPHY N/A 12/08/2018   Procedure: LEFT HEART CATH AND CORONARY ANGIOGRAPHY;  Surgeon: Alwyn Pea, MD;  Location: [facility] INVASIVE CV LAB;  Service: Cardiovascular;  Laterality: N/A;    Medical History: Past Medical History:  Diagnosis Date  . Arthritis   . COPD (chronic obstructive pulmonary disease) (HCC)   . Diabetes (  HCC)   . Hyperlipidemia   . Hypertension   . Kidney stone   . Obesity   . Tachycardia   . TIA (transient ischemic attack)     Family History: Family History  Family history unknown: Yes    Social History   Socioeconomic History  . Marital status: Married    Spouse name: Not on file  . Number of children: Not on file  . Years of education: Not on file  . Highest education level: Not on file  Occupational History  . Not on file  Social Needs  . Financial resource  strain: Not on file  . Food insecurity:    Worry: Never true    Inability: Never true  . Transportation needs:    Medical: No    Non-medical: No  Tobacco Use  . Smoking status: Current Every Day Smoker    Types: Cigarettes  . Smokeless tobacco: Never Used  . Tobacco comment: pt has cut back on amount and currently using the patch to assist with quiting  Substance and Sexual Activity  . Alcohol use: No  . Drug use: No  . Sexual activity: Not Currently  Lifestyle  . Physical activity:    Days per week: 0 days    Minutes per session: Not on file  . Stress: Only a little  Relationships  . Social connections:    Talks on phone: More than three times a week    Gets together: Three times a week    Attends religious service: 1 to 4 times per year    Active member of club or organization: No    Attends meetings of clubs or organizations: Never    Relationship status: Married  . Intimate partner violence:    Fear of current or ex partner: No    Emotionally abused: No    Physically abused: No    Forced sexual activity: No  Other Topics Concern  . Not on file  Social History Narrative  . Not on file      Review of Systems  Constitutional: Positive for fatigue. Negative for activity change, chills and unexpected weight change.  HENT: Negative for congestion, postnasal drip, rhinorrhea, sneezing and sore throat.   Respiratory: Positive for cough and shortness of breath. Negative for chest tightness.   Cardiovascular: Negative for chest pain and palpitations.       Recent STEMI with two stents placed. Recovering well.   Gastrointestinal: Negative for abdominal pain, constipation, diarrhea, nausea and vomiting.  Endocrine: Negative for cold intolerance, heat intolerance, polydipsia and polyuria.  Musculoskeletal: Positive for arthralgias and gait problem. Negative for back pain, joint swelling and neck pain.       Chronic right hip pain. Uses rolling walker to help with ambulation.   Skin: Negative for rash.  Allergic/Immunologic: Negative for environmental allergies.  Neurological: Negative for dizziness, tremors, numbness and headaches.  Hematological: Negative for adenopathy. Does not bruise/bleed easily.  Psychiatric/Behavioral: Negative for behavioral problems (Depression), sleep disturbance and suicidal ideas. The patient is not nervous/anxious.     Today's Vitals   12/16/18 0846  BP: 136/62  Pulse: 89  Resp: 16  SpO2: 96%  Weight: (!) 388 lb (176 kg)  Height: 6\' 1"  (1.854 m)    Physical Exam Vitals signs and nursing note reviewed.  Constitutional:      General: He is not in acute distress.    Appearance: He is well-developed. He is obese. He is not diaphoretic.  HENT:  Head: Normocephalic and atraumatic.     Mouth/Throat:     Pharynx: No oropharyngeal exudate.  Eyes:     Pupils: Pupils are equal, round, and reactive to light.  Neck:     Musculoskeletal: Normal range of motion and neck supple.     Thyroid: No thyromegaly.     Vascular: No carotid bruit or JVD.     Trachea: No tracheal deviation.  Cardiovascular:     Rate and Rhythm: Normal rate and regular rhythm.     Heart sounds: Normal heart sounds. No murmur. No friction rub. No gallop.   Pulmonary:     Effort: Pulmonary effort is normal. No respiratory distress.     Breath sounds: Normal breath sounds. No wheezing or rales.     Comments: Intermittent, congested, non-productive cough . Chest:     Chest wall: No tenderness.  Abdominal:     General: Bowel sounds are normal.     Palpations: Abdomen is soft.  Musculoskeletal: Normal range of motion.  Lymphadenopathy:     Cervical: No cervical adenopathy.  Skin:    General: Skin is warm and dry.  Neurological:     General: No focal deficit present.     Mental Status: He is alert and oriented to person, place, and time.     Cranial Nerves: No cranial nerve deficit.  Psychiatric:        Behavior: Behavior normal.        Thought  Content: Thought content normal.        Judgment: Judgment normal.    Assessment/Plan: 1. ST elevation myocardial infarction (STEMI), unspecified artery [facility](HCC) Recent hospitalization for STEMI. Had two stents placed. Reviewed labs, imaging, and progress notes from hospitalization with patient and his wife. Updated medication list to reflect changes.   2. Coronary arteriosclerosis Now on atorvastatin and brilinta to prevent further atherosclerosis. Will get carotid doppler for further evaluation.  - US Carotid Duplex Bilateral; Future  3. Essential hypertension Stable. Continue bp medication as prescribed.   4. Morbid obesity (HCC) Discussed diet and lifestyle changes to help with weight loss.   5. Moderate cigarette smoker Reviewed importance of smoking cessation on cardiac and overall health. He is cutting down with a goal to quit completely.   General Counseling: Danniel verbalizes understanding of the findings of todays visit and agrees with plan of treatment. I have discussed any further diagnostic evaluation that may be needed or ordered today. We also reviewed his medications today. he has been encouraged to call the office with any questions or concerns that should arise related to todays visit.    Counseling:  Smoking cessation counseling: 1. Pt acknowledges the risks of long term smoking, she will try to quite smoking. 2. Options for different medications including nicotine products, chewing gum, patch etc, Wellbutrin and Chantix is discussed 3. Goal and date of compete cessation is discussed 4. Total time spent in smoking cessation is 15 min.  Cardiac risk factor modification:  1. Control blood pressure. 2. Exercise as prescribed. 3. Follow low sodium, low fat diet. and low fat and low cholestrol diet. 4. Take ASA 81mg  once a day. 5. Restricted calories diet to lose weight.  Orders Placed This Encounter  Procedures  . US Carotid Duplex Bilateral      I have  reviewed all medical records from hospital follow up including radiology reports and consults from other physicians. Appropriate follow up diagnostics will be scheduled as needed. Patient/ Family understands the plan of treatment. Time  spent 30 minutes.   Dr Lyndon Code, MD Internal Medicine

## 2018-12-19 ENCOUNTER — Ambulatory Visit: Payer: Medicare HMO

## 2018-12-19 DIAGNOSIS — I2510 Atherosclerotic heart disease of native coronary artery without angina pectoris: Secondary | ICD-10-CM | POA: Diagnosis not present

## 2018-12-19 DIAGNOSIS — I6523 Occlusion and stenosis of bilateral carotid arteries: Secondary | ICD-10-CM

## 2018-12-25 DIAGNOSIS — J449 Chronic obstructive pulmonary disease, unspecified: Secondary | ICD-10-CM | POA: Diagnosis not present

## 2018-12-25 DIAGNOSIS — E782 Mixed hyperlipidemia: Secondary | ICD-10-CM | POA: Diagnosis not present

## 2018-12-25 DIAGNOSIS — I429 Cardiomyopathy, unspecified: Secondary | ICD-10-CM | POA: Diagnosis not present

## 2018-12-25 DIAGNOSIS — M25561 Pain in right knee: Secondary | ICD-10-CM | POA: Diagnosis not present

## 2018-12-25 DIAGNOSIS — G459 Transient cerebral ischemic attack, unspecified: Secondary | ICD-10-CM | POA: Diagnosis not present

## 2018-12-25 DIAGNOSIS — I2102 ST elevation (STEMI) myocardial infarction involving left anterior descending coronary artery: Secondary | ICD-10-CM | POA: Diagnosis not present

## 2018-12-25 DIAGNOSIS — I2510 Atherosclerotic heart disease of native coronary artery without angina pectoris: Secondary | ICD-10-CM | POA: Diagnosis not present

## 2018-12-25 DIAGNOSIS — K219 Gastro-esophageal reflux disease without esophagitis: Secondary | ICD-10-CM | POA: Diagnosis not present

## 2018-12-25 DIAGNOSIS — I10 Essential (primary) hypertension: Secondary | ICD-10-CM | POA: Diagnosis not present

## 2019-01-04 NOTE — Procedures (Signed)
[facility] [address] [place], [place] [address]  DATE OF SERVICE: December 19, 2018  CAROTID DOPPLER INTERPRETATION:  Bilateral Carotid Ultrsasound and Color Doppler Examination was performed. The RIGHT CCA shows mild plaque in the vessel. The LEFT CCA shows mild plaque in the vessel. There was no intimal thickening noted in the RIGHT carotid artery. There was no intimal thickening in the LEFT carotid artery.  The RIGHT CCA shows peak systolic velocity of 61 cm per second. The end diastolic velocity is 10 cm per second on the RIGHT side. The RIGHT ICA shows peak systolic velocity of 63 per second. RIGHT sided ICA end diastolic velocity is 18 cm per second. The RIGHT ECA shows a peak systolic velocity of 113 cm per second. The ICA/CCA ratio is calculated to be 0.9. This suggests less than 50% stenosis. The Vertebral Artery shows antegrade flow.  The LEFT CCA shows peak systolic velocity of 89 cm per second. The end diastolic velocity is 20 cm per second on the LEFT side. The LEFT ICA shows peak systolic velocity of 80 per second. LEFT sided ICA end diastolic velocity is 18 cm per second. The LEFT ECA shows a peak systolic velocity of 123 cm per second. The ICA/CCA ratio is calculated to be 0.9. This suggests less than 50% stenosis. The Vertebral Artery shows antegrade flow.   Impression:    The RIGHT CAROTID shows no significant stenosis. The LEFT CAROTID shows no significant stenosis.  There is mild plaque formation noted on the LEFT and mild on the RIGHT  side. Consider a repeat Carotid doppler if clinical situation and symptoms warrant in 6-12 months. Patient should be encouraged to change lifestyles such as smoking cessation, regular exercise and dietary modification. Use of statins in the right clinical setting and ASA is encouraged.  Yevonne Pax, MD [facility] Pulmonary Critical Care Medicine

## 2019-01-13 ENCOUNTER — Ambulatory Visit: Payer: Self-pay | Admitting: Nurse Practitioner

## 2019-01-15 ENCOUNTER — Encounter (INDEPENDENT_AMBULATORY_CARE_PROVIDER_SITE_OTHER): Payer: Self-pay

## 2019-01-15 ENCOUNTER — Ambulatory Visit (INDEPENDENT_AMBULATORY_CARE_PROVIDER_SITE_OTHER): Payer: Medicare Other | Admitting: Nurse Practitioner

## 2019-01-15 ENCOUNTER — Encounter: Payer: Self-pay | Admitting: Nurse Practitioner

## 2019-01-15 VITALS — BP 133/62 | HR 100 | Resp 16 | Ht 73.0 in | Wt 387.0 lb

## 2019-01-15 DIAGNOSIS — I10 Essential (primary) hypertension: Secondary | ICD-10-CM

## 2019-01-15 DIAGNOSIS — K219 Gastro-esophageal reflux disease without esophagitis: Secondary | ICD-10-CM | POA: Diagnosis not present

## 2019-01-15 DIAGNOSIS — J0100 Acute maxillary sinusitis, unspecified: Secondary | ICD-10-CM

## 2019-01-15 DIAGNOSIS — I2510 Atherosclerotic heart disease of native coronary artery without angina pectoris: Secondary | ICD-10-CM | POA: Diagnosis not present

## 2019-01-15 DIAGNOSIS — Z0001 Encounter for general adult medical examination with abnormal findings: Secondary | ICD-10-CM

## 2019-01-15 DIAGNOSIS — F17210 Nicotine dependence, cigarettes, uncomplicated: Secondary | ICD-10-CM

## 2019-01-15 MED ORDER — AMOXICILLIN 875 MG PO TABS: 875.0000 mg | ORAL_TABLET | Freq: Two times a day (BID) | ORAL | 0 refills | Status: DC

## 2019-01-15 MED ORDER — FAMOTIDINE 20 MG PO TABS: 20.0000 mg | ORAL_TABLET | Freq: Two times a day (BID) | ORAL | 3 refills | Status: DC

## 2019-01-15 NOTE — Progress Notes (Signed)
[facility] [address] [place], [place] [address]  Internal MEDICINE  Office Visit Note  Patient Name: Thomas Tanner  [id number]  [id number]  Date of Service: 01/15/2019   Pt is here for routine health maintenance examination  Chief Complaint  Patient presents with  . Medicare Wellness    well visit, medication concerns plavix and prolisec, pt has been a little more weaker since hopsital visit, pt is wanting to quit smoking and the patches arent working  . Pain    pt has a lot of pressure underneath the eys and ears feels like it has water,      The patient is here for wellness visit. Suffered MI In December. Continues to recover well. Has some shortness of breath with exertion. He denies any episodes of chest pain or chest pressure. Does have some episodes of heart burn/indigestion. Used to take omeprazole for this which helped. He has not taken this in some time as he has noted that there is interaction between plavix and omeprazole. Has only a few episodes of GERD after eating. Would like to see if there is something different he can take if he has acute symptoms of GERD.  He has had carotid doppler since his last visit .does show mild plaque in both carotid arteries. There is no stenosis, bilaterally. He is on statin and plavix to prevent further plaque build up. He also states that he has decreased his smoking from 3 packs of cigarettes per day to 3/4 pack of cigarettes.     Current Medication: Outpatient Encounter Medications as of 01/15/2019  Medication Sig  . aspirin 81 MG chewable tablet Chew 1 tablet (81 mg total) by mouth daily.  Marland Kitchen atorvastatin (LIPITOR) 80 MG tablet Take 1 tablet (80 mg total) by mouth daily at 6 PM.  . cyclobenzaprine (FLEXERIL) 10 MG tablet Take 1 tablet (10 mg total) by mouth 2 (two) times daily as needed for muscle spasms.  Marland Kitchen glucose blood test strip 1 each by Other route as needed for other. Use as instructed  . Krill Oil  (OMEGA-3) 500 MG CAPS Take by mouth. Take 1 tab po daily  . lisinopril-hydrochlorothiazide (PRINZIDE,ZESTORETIC) 20-12.5 MG tablet Take 1 tablet by mouth daily.  . meloxicam (MOBIC) 7.5 MG tablet Take 7.5 mg by mouth daily.  . metoprolol succinate (TOPROL-XL) 50 MG 24 hr tablet Take 1 tablet (50 mg total) by mouth daily.  . nicotine (NICODERM CQ - DOSED IN MG/24 HOURS) 14 mg/24hr patch Place 1 patch (14 mg total) onto the skin daily.  Letta Pate DELICA LANCETS 33G MISC by Does not apply route.  . ticagrelor (BRILINTA) 90 MG TABS tablet Take 1 tablet (90 mg total) by mouth 2 (two) times daily.  Marland Kitchen triamcinolone cream (KENALOG) 0.5 % Apply 1 application topically 2 (two) times daily.  . VENTOLIN HFA 108 (90 Base) MCG/ACT inhaler Inhale 2 puffs into the lungs every 6 (six) hours as needed for wheezing or shortness of breath.  Marland Kitchen amoxicillin (AMOXIL) 875 MG tablet Take 1 tablet (875 mg total) by mouth 2 (two) times daily.  . famotidine (PEPCID) 20 MG tablet Take 1 tablet (20 mg total) by mouth 2 (two) times daily.  . [DISCONTINUED] omeprazole (PRILOSEC) 40 MG capsule Take 1 capsule (40 mg total) by mouth daily. (Patient not taking: Reported on 01/15/2019)   No facility-administered encounter medications on file as of 01/15/2019.     Surgical History: Past Surgical History:  Procedure Laterality Date  .  CORONARY/GRAFT ACUTE MI REVASCULARIZATION N/A 12/08/2018   Procedure: Coronary/Graft Acute MI Revascularization;  Surgeon: Alwyn Pea, MD;  Location: [facility] INVASIVE CV LAB;  Service: Cardiovascular;  Laterality: N/A;  . KIDNEY STONE SURGERY    . left arm surgery  1963  . LEFT HEART CATH AND CORONARY ANGIOGRAPHY N/A 12/08/2018   Procedure: LEFT HEART CATH AND CORONARY ANGIOGRAPHY;  Surgeon: Alwyn Pea, MD;  Location: [facility] INVASIVE CV LAB;  Service: Cardiovascular;  Laterality: N/A;    Medical History: Past Medical History:  Diagnosis Date  . Arthritis   . COPD (chronic obstructive  pulmonary disease) (HCC)   . Diabetes (HCC)   . Hyperlipidemia   . Hypertension   . Kidney stone   . Obesity   . Tachycardia   . TIA (transient ischemic attack)     Family History: Family History  Family history unknown: Yes      Review of Systems  Constitutional: Positive for fatigue. Negative for activity change, chills and unexpected weight change.  HENT: Positive for congestion, postnasal drip and sinus pressure. Negative for rhinorrhea, sneezing and sore throat.   Respiratory: Positive for shortness of breath. Negative for cough and chest tightness.   Cardiovascular: Negative for chest pain and palpitations.       Recent STEMI with two stents placed. Continues to recover well.   Gastrointestinal: Negative for abdominal pain, constipation, diarrhea, nausea and vomiting.  Endocrine: Negative for cold intolerance, heat intolerance, polydipsia and polyuria.  Musculoskeletal: Positive for arthralgias and gait problem. Negative for back pain, joint swelling and neck pain.       Chronic right hip pain. Uses rolling walker to help with ambulation.  Skin: Negative for rash.  Allergic/Immunologic: Negative for environmental allergies.  Neurological: Negative for dizziness, tremors, numbness and headaches.  Hematological: Negative for adenopathy. Does not bruise/bleed easily.  Psychiatric/Behavioral: Negative for behavioral problems (Depression), sleep disturbance and suicidal ideas. The patient is not nervous/anxious.     Today's Vitals   01/15/19 0917  BP: 133/62  Pulse: 100  Resp: 16  SpO2: 93%  Weight: (!) 387 lb (175.5 kg)  Height: 6\' 1"  (1.854 m)   Body mass index is 51.06 kg/m.  Physical Exam Vitals signs and nursing note reviewed.  Constitutional:      General: He is not in acute distress.    Appearance: He is well-developed. He is obese. He is not diaphoretic.  HENT:     Head: Normocephalic and atraumatic.     Right Ear: Tympanic membrane is bulging.     Left  Ear: Tympanic membrane is bulging.     Nose:     Right Sinus: Maxillary sinus tenderness present.     Left Sinus: Maxillary sinus tenderness present.     Mouth/Throat:     Pharynx: No oropharyngeal exudate.  Eyes:     Pupils: Pupils are equal, round, and reactive to light.  Neck:     Musculoskeletal: Normal range of motion and neck supple.     Thyroid: No thyromegaly.     Vascular: No carotid bruit or JVD.     Trachea: No tracheal deviation.  Cardiovascular:     Rate and Rhythm: Normal rate and regular rhythm.     Pulses: Normal pulses.     Heart sounds: Normal heart sounds. No murmur. No friction rub. No gallop.   Pulmonary:     Effort: Pulmonary effort is normal. No respiratory distress.     Breath sounds: Normal breath sounds. No wheezing or  rales.     Comments: Intermittent, congested, non-productive cough . Chest:     Chest wall: No tenderness.  Abdominal:     General: Bowel sounds are normal.     Palpations: Abdomen is soft.     Tenderness: There is no abdominal tenderness.  Musculoskeletal: Normal range of motion.  Lymphadenopathy:     Cervical: Cervical adenopathy present.  Skin:    General: Skin is warm and dry.     Capillary Refill: Capillary refill takes 2 to 3 seconds.  Neurological:     General: No focal deficit present.     Mental Status: He is alert and oriented to person, place, and time.     Cranial Nerves: No cranial nerve deficit.  Psychiatric:        Behavior: Behavior normal.        Thought Content: Thought content normal.        Judgment: Judgment normal.    Depression screen [facility] 2/9 01/15/2019 12/16/2018 08/04/2018 04/03/2018 02/13/2018  Decreased Interest 0 0 0 3 0  Down, Depressed, Hopeless 0 0 0 0 0  PHQ - 2 Score 0 0 0 3 0  Altered sleeping - - - 0 -  Tired, decreased energy - - - 0 -  Change in appetite - - - 0 -  Feeling bad or failure about yourself  - - - 0 -  Trouble concentrating - - - 0 -  Moving slowly or fidgety/restless - - - 0 -   Suicidal thoughts - - - 0 -  PHQ-9 Score - - - 3 -    Functional Status Survey: Is the patient deaf or have difficulty hearing?: Yes Does the patient have difficulty seeing, even when wearing glasses/contacts?: Yes Does the patient have difficulty concentrating, remembering, or making decisions?: No Does the patient have difficulty walking or climbing stairs?: Yes Does the patient have difficulty dressing or bathing?: No Does the patient have difficulty doing errands alone such as visiting a doctor's office or shopping?: No  MMSE - Mini Mental State Exam 01/15/2019  Orientation to time 5  Orientation to Place 5  Registration 3  Attention/ Calculation 5  Recall 3  Language- name 2 objects 2  Language- repeat 1  Language- follow 3 step command 3  Language- read & follow direction 1  Write a sentence 1  Copy design 1  Total score 30    Fall Risk  01/15/2019 01/15/2019 12/16/2018 08/04/2018 04/03/2018  Falls in the past year? 0 0 0 No No      LABS: Recent Results (from the past 2160 hour(s))  Brain natriuretic peptide     Status: None   Collection Time: 12/08/18  3:30 AM  Result Value Ref Range   B Natriuretic Peptide 41.0 0.0 - 100.0 pg/mL    Comment: Performed at [facility], [address] [place], [place] [address]  Troponin I - Once     Status: Abnormal   Collection Time: 12/08/18  3:30 AM  Result Value Ref Range   Troponin I 0.23 (HH) <0.03 ng/mL    Comment: CRITICAL RESULT CALLED TO, READ BACK BY AND VERIFIED WITH ANN PRIDE ON 12/08/18 AT 0359 [facility] Performed at [facility], [address] [place], [place] [address]   CBC with Differential     Status: Abnormal   Collection Time: 12/08/18  3:30 AM  Result Value Ref Range   WBC 10.5 4.0 - 10.5 K/uL   RBC 5.50 4.22 - 5.81 MIL/uL  Hemoglobin 17.0 13.0 - 17.0 g/dL   HCT 45.452.4 (H) 09.839.0 - 11.952.0 %   MCV 95.3 80.0 - 100.0 fL   MCH 30.9 26.0 - 34.0 pg   MCHC 32.4 30.0 - 36.0 g/dL   RDW 14.713.3 82.911.5 - 56.215.5  %   Platelets 251 150 - 400 K/uL   nRBC 0.0 0.0 - 0.2 %   Neutrophils Relative % 75 %   Neutro Abs 8.0 (H) 1.7 - 7.7 K/uL   Lymphocytes Relative 16 %   Lymphs Abs 1.6 0.7 - 4.0 K/uL   Monocytes Relative 7 %   Monocytes Absolute 0.7 0.1 - 1.0 K/uL   Eosinophils Relative 1 %   Eosinophils Absolute 0.1 0.0 - 0.5 K/uL   Basophils Relative 1 %   Basophils Absolute 0.1 0.0 - 0.1 K/uL   Immature Granulocytes 0 %   Abs Immature Granulocytes 0.04 0.00 - 0.07 K/uL    Comment: Performed at [facility], [address] [place], [place] [address]  APTT     Status: None   Collection Time: 12/08/18  3:30 AM  Result Value Ref Range   aPTT 24 24 - 36 seconds    Comment: Performed at [facility], [address] [place], [place] [address]  POCT Activated clotting time     Status: None   Collection Time: 12/08/18  4:27 AM  Result Value Ref Range   Activated Clotting Time 356 seconds  Glucose, capillary     Status: Abnormal   Collection Time: 12/08/18  6:30 AM  Result Value Ref Range   Glucose-Capillary 152 (H) 70 - 99 mg/dL   Comment 1 Document in Chart   MRSA PCR Screening     Status: None   Collection Time: 12/08/18  6:49 AM  Result Value Ref Range   MRSA by PCR NEGATIVE NEGATIVE    Comment:        The GeneXpert MRSA Assay (FDA approved for NASAL specimens only), is one component of a comprehensive MRSA colonization surveillance program. It is not intended to diagnose MRSA infection nor to guide or monitor treatment for MRSA infections. Performed at [facility], [address] [place], [place] [address]   Comprehensive metabolic panel     Status: Abnormal   Collection Time: 12/08/18  6:57 AM  Result Value Ref Range   Sodium 134 (L) 135 - 145 mmol/L   Potassium 4.2 3.5 - 5.1 mmol/L   Chloride 98 98 - 111 mmol/L   CO2 26 22 - 32 mmol/L   Glucose, Bld 142 (H) 70 - 99 mg/dL   BUN 22 8 - 23 mg/dL   Creatinine, Ser 2.841.23 0.61 - 1.24 mg/dL   Calcium  9.1 8.9 - 13.210.3 mg/dL   Total Protein 7.1 6.5 - 8.1 g/dL   Albumin 3.7 3.5 - 5.0 g/dL   AST 37 15 - 41 U/L   ALT 15 0 - 44 U/L   Alkaline Phosphatase 120 38 - 126 U/L   Total Bilirubin 0.7 0.3 - 1.2 mg/dL   GFR calc non Af Amer >60 >60 mL/min   GFR calc Af Amer >60 >60 mL/min   Anion gap 10 5 - 15    Comment: Performed at [facility], [address] [place], [place] [address]  Troponin I - Now Then Q6H     Status: Abnormal   Collection Time: 12/08/18  6:57 AM  Result Value Ref Range   Troponin I 4.85 (HH) <0.03 ng/mL  Comment: CRITICAL RESULT CALLED TO, READ BACK BY AND VERIFIED WITH MYRA FLOWERS AT 0740 12/08/18 DAS Performed at [facility], [address] [place], [place] [address]   TSH     Status: None   Collection Time: 12/08/18  6:57 AM  Result Value Ref Range   TSH 0.708 0.350 - 4.500 uIU/mL    Comment: Performed by a 3rd Generation assay with a functional sensitivity of <=0.01 uIU/mL. Performed at [facility], [address] [place], [place] [address]   Hemoglobin A1c     Status: None   Collection Time: 12/08/18  6:57 AM  Result Value Ref Range   Hgb A1c MFr Bld 5.4 4.8 - 5.6 %    Comment: (NOTE) Pre diabetes:          5.7%-6.4% Diabetes:              >6.4% Glycemic control for   <7.0% adults with diabetes    Mean Plasma Glucose 108.28 mg/dL    Comment: Performed at [facility], [address], [place], [place] [address]  Troponin I - Now Then Q6H     Status: Abnormal   Collection Time: 12/08/18 11:38 AM  Result Value Ref Range   Troponin I 18.04 (HH) <0.03 ng/mL    Comment: CRITICAL RESULT CALLED TO, READ BACK BY AND VERIFIED WITH HIRAL PATEL AT 1230 12/08/18 DAS Performed at [facility], [address] [place], [place] [address]   Lipid panel     Status: Abnormal   Collection Time: 12/08/18 11:38 AM  Result Value Ref Range   Cholesterol 163 0 - 200 mg/dL   Triglycerides 606 <301 mg/dL   HDL 34 (L) >60  mg/dL   Total CHOL/HDL Ratio 4.8 RATIO   VLDL 24 0 - 40 mg/dL   LDL Cholesterol 109 (H) 0 - 99 mg/dL    Comment:        Total Cholesterol/HDL:CHD Risk Coronary Heart Disease Risk Table                     Men   Women  1/2 Average Risk   3.4   3.3  Average Risk       5.0   4.4  2 X Average Risk   9.6   7.1  3 X Average Risk  23.4   11.0        Use the calculated Patient Ratio above and the CHD Risk Table to determine the patient's CHD Risk.        ATP III CLASSIFICATION (LDL):  <100     mg/dL   Optimal  323-557  mg/dL   Near or Above                    Optimal  130-159  mg/dL   Borderline  322-025  mg/dL   High  >427     mg/dL   Very High Performed at [facility], [address] [place], [place] [address]   Glucose, capillary     Status: None   Collection Time: 12/08/18 11:49 AM  Result Value Ref Range   Glucose-Capillary 84 70 - 99 mg/dL  Glucose, capillary     Status: Abnormal   Collection Time: 12/08/18  4:14 PM  Result Value Ref Range   Glucose-Capillary 106 (H) 70 - 99 mg/dL  Troponin I - Now Then Q6H     Status: Abnormal   Collection Time: 12/08/18  5:22 PM  Result Value Ref Range   Troponin I 15.44 (HH) <0.03 ng/mL    Comment: CRITICAL VALUE NOTED. VALUE IS CONSISTENT WITH PREVIOUSLY REPORTED/CALLED VALUE SNJ Performed at [facility], [address] [place], [place] [address]   Glucose, capillary     Status: Abnormal   Collection Time: 12/08/18  9:51 PM  Result Value Ref Range   Glucose-Capillary 136 (H) 70 - 99 mg/dL  Basic metabolic panel     Status: Abnormal   Collection Time: 12/09/18  3:21 AM  Result Value Ref Range   Sodium 136 135 - 145 mmol/L   Potassium 3.6 3.5 - 5.1 mmol/L   Chloride 99 98 - 111 mmol/L   CO2 26 22 - 32 mmol/L   Glucose, Bld 116 (H) 70 - 99 mg/dL   BUN 22 8 - 23 mg/dL   Creatinine, Ser 3.87 (H) 0.61 - 1.24 mg/dL   Calcium 9.1 8.9 - 56.4 mg/dL   GFR calc non Af Amer 59 (L) >60 mL/min   GFR calc Af Amer >60  >60 mL/min   Anion gap 11 5 - 15    Comment: Performed at [facility], [address] [place], [place] [address]  CBC     Status: None   Collection Time: 12/09/18  3:21 AM  Result Value Ref Range   WBC 9.5 4.0 - 10.5 K/uL   RBC 4.89 4.22 - 5.81 MIL/uL   Hemoglobin 15.3 13.0 - 17.0 g/dL   HCT 18.8 41.6 - 60.6 %   MCV 93.0 80.0 - 100.0 fL   MCH 31.3 26.0 - 34.0 pg   MCHC 33.6 30.0 - 36.0 g/dL   RDW 30.1 60.1 - 09.3 %   Platelets 247 150 - 400 K/uL   nRBC 0.0 0.0 - 0.2 %    Comment: Performed at [facility], [address] [place], [place] [address]  Glucose, capillary     Status: Abnormal   Collection Time: 12/09/18  8:36 AM  Result Value Ref Range   Glucose-Capillary 147 (H) 70 - 99 mg/dL  ECHOCARDIOGRAM COMPLETE     Status: None   Collection Time: 12/09/18  9:55 AM  Result Value Ref Range   Weight 6,296.34 oz   Height 73 in   BP 114/104 mmHg  Glucose, capillary     Status: None   Collection Time: 12/09/18 11:59 AM  Result Value Ref Range   Glucose-Capillary 99 70 - 99 mg/dL  Glucose, capillary     Status: Abnormal   Collection Time: 12/09/18  4:28 PM  Result Value Ref Range   Glucose-Capillary 107 (H) 70 - 99 mg/dL  Glucose, capillary     Status: None   Collection Time: 12/09/18  9:33 PM  Result Value Ref Range   Glucose-Capillary 96 70 - 99 mg/dL  Basic metabolic panel     Status: Abnormal   Collection Time: 12/10/18  5:04 AM  Result Value Ref Range   Sodium 134 (L) 135 - 145 mmol/L   Potassium 4.0 3.5 - 5.1 mmol/L   Chloride 100 98 - 111 mmol/L   CO2 26 22 - 32 mmol/L   Glucose, Bld 106 (H) 70 - 99 mg/dL   BUN 26 (H) 8 - 23 mg/dL   Creatinine, Ser 3.22 (H) 0.61 - 1.24 mg/dL   Calcium 8.8 (L) 8.9 - 10.3 mg/dL   GFR calc non Af Amer >60 >60 mL/min   GFR calc Af Amer >60 >60 mL/min  Anion gap 8 5 - 15    Comment: Performed at [facility], [address] [place], [place] [address]  Glucose, capillary     Status: None    Collection Time: 12/10/18  7:22 AM  Result Value Ref Range   Glucose-Capillary 97 70 - 99 mg/dL  Glucose, capillary     Status: Abnormal   Collection Time: 12/10/18 11:46 AM  Result Value Ref Range   Glucose-Capillary 121 (H) 70 - 99 mg/dL   Assessment/Plan: 1. Encounter for general adult medical examination with abnormal findings Annual health maintenance exam today.  2. Acute non-recurrent maxillary sinusitis Start amoxicillin 875mg  twice daily for 10 days. Recommend saline nasal spray as needed.  - amoxicillin (AMOXIL) 875 MG tablet; Take 1 tablet (875 mg total) by mouth 2 (two) times daily.  Dispense: 20 tablet; Refill: 0  3. Gastroesophageal reflux disease without esophagitis D/c omeprazole. Add pepcid 20mg  twice daily as needed for GERD symptoms.  - famotidine (PEPCID) 20 MG tablet; Take 1 tablet (20 mg total) by mouth 2 (two) times daily.  Dispense: 60 tablet; Refill: 3  4. Coronary arteriosclerosis Reviewed carotid doppler results. Mild plaque with no stenosis bilaterally. Repeat in one year. Continue visits with cardiology as scheduled.   5. Essential hypertension Stable. Continue bp medication as prescribed   6. Moderate cigarette smoker Patient has cut smoking from 3 packs of cigarettes per day to 3/4 packs per day. congratulated him on progress and encouraged him to continue cutting back until he quits competently.   General Counseling: Murtaza verbalizes understanding of the findings of todays visit and agrees with plan of treatment. I have discussed any further diagnostic evaluation that may be needed or ordered today. We also reviewed his medications today. he has been encouraged to call the office with any questions or concerns that should arise related to todays visit.    Counseling:  Cardiac risk factor modification:  1. Control blood pressure. 2. Exercise as prescribed. 3. Follow low sodium, low fat diet. and low fat and low cholestrol diet. 4. Take ASA 81mg   once a day. 5. Restricted calories diet to lose weight.   Smoking cessation counseling: 1. Pt acknowledges the risks of long term smoking, she will try to quite smoking. 2. Options for different medications including nicotine products, chewing gum, patch etc, Wellbutrin and Chantix is discussed 3. Goal and date of compete cessation is discussed 4. Total time spent in smoking cessation is 10 min.   This patient was seen by Vincent Gros FNP Collaboration with Dr Lyndon Code as a part of collaborative care agreement  Meds ordered this encounter  Medications  . famotidine (PEPCID) 20 MG tablet    Sig: Take 1 tablet (20 mg total) by mouth 2 (two) times daily.    Dispense:  60 tablet    Refill:  3    Please d/c omeprazole due to interaction with plavix. Thanks.    Order Specific Question:   Supervising Provider    Answer:   Lyndon Code [[id number]]  . amoxicillin (AMOXIL) 875 MG tablet    Sig: Take 1 tablet (875 mg total) by mouth 2 (two) times daily.    Dispense:  20 tablet    Refill:  0    Order Specific Question:   Supervising Provider    Answer:   Lyndon Code [[id number]]    Time spent: 11 Minutes      Lyndon Code, MD  Internal Medicine

## 2019-02-20 ENCOUNTER — Other Ambulatory Visit: Payer: Self-pay

## 2019-02-20 MED ORDER — MELOXICAM 7.5 MG PO TABS: 7.5000 mg | ORAL_TABLET | Freq: Every day | ORAL | 1 refills | Status: DC

## 2019-04-02 ENCOUNTER — Other Ambulatory Visit: Payer: Self-pay

## 2019-04-02 MED ORDER — MELOXICAM 7.5 MG PO TABS: 7.5000 mg | ORAL_TABLET | Freq: Every day | ORAL | 1 refills | Status: DC

## 2019-04-29 ENCOUNTER — Other Ambulatory Visit: Payer: Self-pay

## 2019-04-29 DIAGNOSIS — J4520 Mild intermittent asthma, uncomplicated: Secondary | ICD-10-CM

## 2019-04-29 MED ORDER — VENTOLIN HFA 108 (90 BASE) MCG/ACT IN AERS: 2.0000 | INHALATION_SPRAY | Freq: Four times a day (QID) | RESPIRATORY_TRACT | 1 refills | Status: DC | PRN

## 2019-05-21 ENCOUNTER — Encounter: Payer: Self-pay | Admitting: Nurse Practitioner

## 2019-05-21 ENCOUNTER — Other Ambulatory Visit: Payer: Self-pay

## 2019-05-21 ENCOUNTER — Ambulatory Visit (INDEPENDENT_AMBULATORY_CARE_PROVIDER_SITE_OTHER): Payer: Medicare Other | Admitting: Nurse Practitioner

## 2019-05-21 VITALS — Ht 73.0 in | Wt 350.0 lb

## 2019-05-21 DIAGNOSIS — I2510 Atherosclerotic heart disease of native coronary artery without angina pectoris: Secondary | ICD-10-CM | POA: Diagnosis not present

## 2019-05-21 DIAGNOSIS — K219 Gastro-esophageal reflux disease without esophagitis: Secondary | ICD-10-CM | POA: Diagnosis not present

## 2019-05-21 DIAGNOSIS — M25561 Pain in right knee: Secondary | ICD-10-CM | POA: Diagnosis not present

## 2019-05-21 DIAGNOSIS — G8929 Other chronic pain: Secondary | ICD-10-CM | POA: Diagnosis not present

## 2019-05-21 MED ORDER — CLOPIDOGREL BISULFATE 75 MG PO TABS: 75.0000 mg | ORAL_TABLET | Freq: Every day | ORAL | 3 refills | Status: DC

## 2019-05-21 MED ORDER — MELOXICAM 7.5 MG PO TABS: 7.5000 mg | ORAL_TABLET | Freq: Two times a day (BID) | ORAL | 3 refills | Status: DC | PRN

## 2019-05-21 MED ORDER — ROSUVASTATIN CALCIUM 20 MG PO TABS: 20.0000 mg | ORAL_TABLET | Freq: Every day | ORAL | 3 refills | Status: DC

## 2019-05-21 NOTE — Progress Notes (Signed)
[facility] [address] [place], [place] [address]  Internal MEDICINE  Telephone Visit  Patient Name: Thomas BodilyRichard Douglas Tanner  [id number]  [id number]  Date of Service: 05/21/2019  I connected with the patient at 12:25pm by webcam and verified the patients identity using two identifiers.   I discussed the limitations, risks, security and privacy concerns of performing an evaluation and management service by webcam and the availability of in person appointments. I also discussed with the patient that there may be a patient responsible charge related to the service.  The patient expressed understanding and agrees to proceed.    Chief Complaint  Patient presents with  . Telephone Screen    VIDEO VISIT [phone]  . Telephone Assessment  . Medical Management of Chronic Issues    4 month routine follow up, medication concern  . Hypertension  . Hyperlipidemia  . Diabetes    The patient has been contacted via webcam for follow up visit due to concerns for spread of novel coronavirus. The patient staes that he is having generalized joint and muscle pain. Feels like this may have started when he started high dose lipitor after having heart attack. Does take meloxicam 7.5mg , but states that only taking this daily is not helping the joint pain at all. He does have significant knee pain. Getting to the point where knee will just give out on him when standing for long periods of time.       Current Medication: Outpatient Encounter Medications as of 05/21/2019  Medication Sig Note  . aspirin 81 MG chewable tablet Chew 1 tablet (81 mg total) by mouth daily.   . cyclobenzaprine (FLEXERIL) 10 MG tablet Take 1 tablet (10 mg total) by mouth 2 (two) times daily as needed for muscle spasms.   . famotidine (PEPCID) 20 MG tablet Take 1 tablet (20 mg total) by mouth 2 (two) times daily.   Marland Kitchen. glucose blood test strip 1 each by Other route as needed for other. Use as instructed   . Krill Oil  (OMEGA-3) 500 MG CAPS Take by mouth. Take 1 tab po daily   . lisinopril-hydrochlorothiazide (PRINZIDE,ZESTORETIC) 20-12.5 MG tablet Take 1 tablet by mouth daily.   . meloxicam (MOBIC) 7.5 MG tablet Take 1 tablet (7.5 mg total) by mouth 2 (two) times daily as needed for pain.   . metoprolol succinate (TOPROL-XL) 50 MG 24 hr tablet Take 1 tablet (50 mg total) by mouth daily.   . nicotine (NICODERM CQ - DOSED IN MG/24 HOURS) 14 mg/24hr patch Place 1 patch (14 mg total) onto the skin daily.   Letta Pate. ONETOUCH DELICA LANCETS 33G MISC by Does not apply route.   . triamcinolone cream (KENALOG) 0.5 % Apply 1 application topically 2 (two) times daily.   . VENTOLIN HFA 108 (90 Base) MCG/ACT inhaler Inhale 2 puffs into the lungs every 6 (six) hours as needed for wheezing or shortness of breath.   . [DISCONTINUED] atorvastatin (LIPITOR) 80 MG tablet Take 1 tablet (80 mg total) by mouth daily at 6 PM. 05/21/2019: causing joint and muscle pain  . [DISCONTINUED] meloxicam (MOBIC) 7.5 MG tablet Take 1 tablet (7.5 mg total) by mouth daily.   . [DISCONTINUED] ticagrelor (BRILINTA) 90 MG TABS tablet Take 1 tablet (90 mg total) by mouth 2 (two) times daily. 05/21/2019: changed to plavix per cardiology  . clopidogrel (PLAVIX) 75 MG tablet Take 1 tablet (75 mg total) by mouth daily.   . rosuvastatin (CRESTOR) 20 MG tablet Take 1 tablet (  20 mg total) by mouth daily.   . [DISCONTINUED] amoxicillin (AMOXIL) 875 MG tablet Take 1 tablet (875 mg total) by mouth 2 (two) times daily. (Patient not taking: Reported on 05/21/2019)    No facility-administered encounter medications on file as of 05/21/2019.     Surgical History: Past Surgical History:  Procedure Laterality Date  . CORONARY/GRAFT ACUTE MI REVASCULARIZATION N/A 12/08/2018   Procedure: Coronary/Graft Acute MI Revascularization;  Surgeon: Alwyn Peaallwood, Dwayne D, MD;  Location: [facility] INVASIVE CV LAB;  Service: Cardiovascular;  Laterality: N/A;  . KIDNEY STONE SURGERY    . left  arm surgery  1963  . LEFT HEART CATH AND CORONARY ANGIOGRAPHY N/A 12/08/2018   Procedure: LEFT HEART CATH AND CORONARY ANGIOGRAPHY;  Surgeon: Alwyn Peaallwood, Dwayne D, MD;  Location: [facility] INVASIVE CV LAB;  Service: Cardiovascular;  Laterality: N/A;    Medical History: Past Medical History:  Diagnosis Date  . Arthritis   . COPD (chronic obstructive pulmonary disease) (HCC)   . Diabetes (HCC)   . Hyperlipidemia   . Hypertension   . Kidney stone   . Obesity   . Tachycardia   . TIA (transient ischemic attack)     Family History: Family History  Family history unknown: Yes    Social History   Socioeconomic History  . Marital status: Married    Spouse name: Not on file  . Number of children: Not on file  . Years of education: Not on file  . Highest education level: Not on file  Occupational History  . Not on file  Social Needs  . Financial resource strain: Not on file  . Food insecurity    Worry: Never true    Inability: Never true  . Transportation needs    Medical: No    Non-medical: No  Tobacco Use  . Smoking status: Current Every Day Smoker    Types: Cigarettes  . Smokeless tobacco: Never Used  . Tobacco comment: pt has cut back on amount and currently using the patch to assist with quiting  Substance and Sexual Activity  . Alcohol use: No  . Drug use: No  . Sexual activity: Not Currently  Lifestyle  . Physical activity    Days per week: 0 days    Minutes per session: Not on file  . Stress: Only a little  Relationships  . Social connections    Talks on phone: More than three times a week    Gets together: Three times a week    Attends religious service: 1 to 4 times per year    Active member of club or organization: No    Attends meetings of clubs or organizations: Never    Relationship status: Married  . Intimate partner violence    Fear of current or ex partner: No    Emotionally abused: No    Physically abused: No    Forced sexual activity: No  Other  Topics Concern  . Not on file  Social History Narrative  . Not on file      Review of Systems  Constitutional: Positive for fatigue. Negative for activity change, chills and unexpected weight change.  HENT: Negative for congestion, postnasal drip, rhinorrhea, sinus pressure, sneezing and sore throat.   Respiratory: Positive for shortness of breath. Negative for cough and chest tightness.   Cardiovascular: Negative for chest pain and palpitations.       Recent STEMI with two stents placed. Continues to recover well.   Gastrointestinal: Negative for abdominal pain, constipation, diarrhea,  nausea and vomiting.  Endocrine: Negative for cold intolerance, heat intolerance, polydipsia and polyuria.  Musculoskeletal: Positive for arthralgias and gait problem. Negative for back pain, joint swelling and neck pain.       Chronic right hip pain. Uses rolling walker to help with ambulation.  Skin: Negative for rash.  Allergic/Immunologic: Positive for environmental allergies.  Neurological: Negative for dizziness, tremors, numbness and headaches.  Hematological: Negative for adenopathy. Does not bruise/bleed easily.  Psychiatric/Behavioral: Negative for behavioral problems (Depression), sleep disturbance and suicidal ideas. The patient is not nervous/anxious.     Today's Vitals   05/21/19 1159  Weight: (!) 350 lb (158.8 kg)  Height: 6\' 1"  (1.854 m)   Body mass index is 46.18 kg/m.  Observation/Objective:  The patient is alert and oriented. He is pleasant and answering all questions appropriately. Breathing is non-labored. He is in no acute distress.    Assessment/Plan:  1. Chronic pain of right knee Meloxicam 7.5 increased to twice daily. Explained risks of taking this along with plavix. He voices understanding. Will monitor for negative side effects.  - meloxicam (MOBIC) 7.5 MG tablet; Take 1 tablet (7.5 mg total) by mouth 2 (two) times daily as needed for pain.  Dispense: 60 tablet;  Refill: 3  2. Coronary arteriosclerosis Change atorvastatint to crestor 20mg  daily. New prescription sent to his pharmacy. Updated med list to reflect change from brilinta to plavix.  - rosuvastatin (CRESTOR) 20 MG tablet; Take 1 tablet (20 mg total) by mouth daily.  Dispense: 30 tablet; Refill: 3 - clopidogrel (PLAVIX) 75 MG tablet; Take 1 tablet (75 mg total) by mouth daily.  Dispense: 30 tablet; Refill: 3  3. Gastroesophageal reflux disease without esophagitis Continue pepcid AC as needed and as indicated .  General Counseling: Hitoshi verbalizes understanding of the findings of today's phone visit and agrees with plan of treatment. I have discussed any further diagnostic evaluation that may be needed or ordered today. We also reviewed his medications today. he has been encouraged to call the office with any questions or concerns that should arise related to todays visit.  Cardiac risk factor modification:  1. Control blood pressure. 2. Exercise as prescribed. 3. Follow low sodium, low fat diet. and low fat and low cholestrol diet. 4. Take ASA 81mg  once a day. 5. Restricted calories diet to lose weight.  This patient was seen by [place] with Dr [name] as a part of collaborative care agreement  Meds ordered this encounter  Medications  . rosuvastatin (CRESTOR) 20 MG tablet    Sig: Take 1 tablet (20 mg total) by mouth daily.    Dispense:  30 tablet    Refill:  3    Please d/c atorvastatin due to negative side effects.    Order Specific Question:   Supervising Provider    Answer:   [name] [[id number]]  . meloxicam (MOBIC) 7.5 MG tablet    Sig: Take 1 tablet (7.5 mg total) by mouth 2 (two) times daily as needed for pain.    Dispense:  60 tablet    Refill:  3    Order Specific Question:   Supervising Provider    Answer:   [name] [[id number]]  . clopidogrel (PLAVIX) 75 MG tablet    Sig: Take 1 tablet (75 mg total) by mouth daily.    Dispense:  30  tablet    Refill:  3    Changed per cardiology    Order Specific Question:  Supervising Provider    Answer:   [name] [[id number]]    Time spent: 9 Minutes    Dr [name] Internal medicine

## 2019-06-03 ENCOUNTER — Other Ambulatory Visit: Payer: Self-pay

## 2019-06-03 DIAGNOSIS — K219 Gastro-esophageal reflux disease without esophagitis: Secondary | ICD-10-CM

## 2019-06-03 DIAGNOSIS — I2510 Atherosclerotic heart disease of native coronary artery without angina pectoris: Secondary | ICD-10-CM

## 2019-06-03 DIAGNOSIS — G8929 Other chronic pain: Secondary | ICD-10-CM

## 2019-06-03 MED ORDER — FAMOTIDINE 20 MG PO TABS: 20.0000 mg | ORAL_TABLET | Freq: Two times a day (BID) | ORAL | 3 refills | Status: DC

## 2019-06-03 MED ORDER — FAMOTIDINE 20 MG PO TABS: 20.0000 mg | ORAL_TABLET | Freq: Two times a day (BID) | ORAL | 1 refills | Status: DC

## 2019-06-03 MED ORDER — ROSUVASTATIN CALCIUM 20 MG PO TABS: 20.0000 mg | ORAL_TABLET | Freq: Every day | ORAL | 3 refills | Status: DC

## 2019-06-03 MED ORDER — MELOXICAM 7.5 MG PO TABS: 7.5000 mg | ORAL_TABLET | Freq: Two times a day (BID) | ORAL | 3 refills | Status: DC | PRN

## 2019-06-03 MED ORDER — ROSUVASTATIN CALCIUM 20 MG PO TABS: 20.0000 mg | ORAL_TABLET | Freq: Every day | ORAL | 1 refills | Status: DC

## 2019-06-03 MED ORDER — MELOXICAM 7.5 MG PO TABS: 7.5000 mg | ORAL_TABLET | Freq: Two times a day (BID) | ORAL | 0 refills | Status: DC | PRN

## 2019-06-03 MED ORDER — ASPIRIN 81 MG PO CHEW: 81.0000 mg | CHEWABLE_TABLET | Freq: Every day | ORAL | 0 refills | Status: DC

## 2019-06-04 ENCOUNTER — Other Ambulatory Visit: Payer: Self-pay

## 2019-06-04 MED ORDER — OMEGA-3 500 MG PO CAPS: ORAL_CAPSULE | ORAL | 3 refills | Status: DC

## 2019-06-17 ENCOUNTER — Other Ambulatory Visit: Payer: Self-pay

## 2019-06-18 ENCOUNTER — Other Ambulatory Visit: Payer: Self-pay

## 2019-06-18 DIAGNOSIS — G8929 Other chronic pain: Secondary | ICD-10-CM

## 2019-06-18 MED ORDER — MELOXICAM 7.5 MG PO TABS: 7.5000 mg | ORAL_TABLET | Freq: Two times a day (BID) | ORAL | 0 refills | Status: DC | PRN

## 2019-09-21 ENCOUNTER — Encounter: Payer: Self-pay | Admitting: Nurse Practitioner

## 2019-09-21 ENCOUNTER — Other Ambulatory Visit: Payer: Self-pay

## 2019-09-21 ENCOUNTER — Ambulatory Visit (INDEPENDENT_AMBULATORY_CARE_PROVIDER_SITE_OTHER): Payer: Medicare Other | Admitting: Nurse Practitioner

## 2019-09-21 VITALS — BP 130/72 | HR 97 | Temp 96.9°F | Resp 16 | Ht 73.0 in | Wt >= 6400 oz

## 2019-09-21 DIAGNOSIS — J4520 Mild intermittent asthma, uncomplicated: Secondary | ICD-10-CM

## 2019-09-21 DIAGNOSIS — I10 Essential (primary) hypertension: Secondary | ICD-10-CM

## 2019-09-21 DIAGNOSIS — I2510 Atherosclerotic heart disease of native coronary artery without angina pectoris: Secondary | ICD-10-CM | POA: Diagnosis not present

## 2019-09-21 DIAGNOSIS — F17210 Nicotine dependence, cigarettes, uncomplicated: Secondary | ICD-10-CM

## 2019-09-21 MED ORDER — METOPROLOL SUCCINATE ER 50 MG PO TB24: 50.0000 mg | ORAL_TABLET | Freq: Every day | ORAL | 1 refills | Status: DC

## 2019-09-21 MED ORDER — LISINOPRIL-HYDROCHLOROTHIAZIDE 20-12.5 MG PO TABS: 1.0000 | ORAL_TABLET | Freq: Every day | ORAL | 1 refills | Status: DC

## 2019-09-21 MED ORDER — CLOPIDOGREL BISULFATE 75 MG PO TABS: 75.0000 mg | ORAL_TABLET | Freq: Every day | ORAL | 1 refills | Status: DC

## 2019-09-21 NOTE — Progress Notes (Signed)
[facility] [address] [place], [place] [address]  Internal MEDICINE  Office Visit Note  Patient Name: Thomas Tanner  [id number]  [id number]  Date of Service: 09/21/2019  Chief Complaint  Patient presents with  . Hypertension  . Hyperlipidemia    The patient is here for routine follow up visit. He is concerned about significant weight gain since he was last seen in the office. He was 387 pounds st his most recent visit. Today, he is 417. He states that this past week, he has started to cut out soda. Has cut them all out except for one each week. He is cutting back on bread and carbohydrates. He has started to move around the house a little more and is now using the stationary bicycle at home a few times a week.  His goal is to get weight down to about 265 pounds. He has noted increased shortness of breath with exertion. Having to use rescue inhaler a bit more often then usual. He does c/o muscle pain in the thighs and hamstrings. States that he has noticed this more on higher dose of crestor and with increase physical activity. He states that he has cut back his smoking from 1.5 packs of cigarettes per day to 1 pack of cigarettes per day.       Current Medication: Outpatient Encounter Medications as of 09/21/2019  Medication Sig  . aspirin 81 MG chewable tablet Chew 1 tablet (81 mg total) by mouth daily.  . clopidogrel (PLAVIX) 75 MG tablet Take 1 tablet (75 mg total) by mouth daily.  . cyclobenzaprine (FLEXERIL) 10 MG tablet Take 1 tablet (10 mg total) by mouth 2 (two) times daily as needed for muscle spasms.  . famotidine (PEPCID) 20 MG tablet Take 1 tablet (20 mg total) by mouth 2 (two) times daily.  Marland Kitchen glucose blood test strip 1 each by Other route as needed for other. Use as instructed  . Krill Oil (OMEGA-3) 500 MG CAPS Take 1 tab po daily  . lisinopril-hydrochlorothiazide (ZESTORETIC) 20-12.5 MG tablet Take 1 tablet by mouth daily.  . meloxicam (MOBIC)  7.5 MG tablet Take 1 tablet (7.5 mg total) by mouth 2 (two) times daily as needed for pain.  . metoprolol succinate (TOPROL-XL) 50 MG 24 hr tablet Take 1 tablet (50 mg total) by mouth daily.  . nicotine (NICODERM CQ - DOSED IN MG/24 HOURS) 14 mg/24hr patch Place 1 patch (14 mg total) onto the skin daily.  Letta Pate DELICA LANCETS 33G MISC by Does not apply route.  . rosuvastatin (CRESTOR) 20 MG tablet Take 1 tablet (20 mg total) by mouth daily.  Marland Kitchen triamcinolone cream (KENALOG) 0.5 % Apply 1 application topically 2 (two) times daily.  . VENTOLIN HFA 108 (90 Base) MCG/ACT inhaler Inhale 2 puffs into the lungs every 6 (six) hours as needed for wheezing or shortness of breath.  . [DISCONTINUED] clopidogrel (PLAVIX) 75 MG tablet Take 1 tablet (75 mg total) by mouth daily.  . [DISCONTINUED] lisinopril-hydrochlorothiazide (PRINZIDE,ZESTORETIC) 20-12.5 MG tablet Take 1 tablet by mouth daily.  . [DISCONTINUED] metoprolol succinate (TOPROL-XL) 50 MG 24 hr tablet Take 1 tablet (50 mg total) by mouth daily.   No facility-administered encounter medications on file as of 09/21/2019.     Surgical History: Past Surgical History:  Procedure Laterality Date  . CORONARY/GRAFT ACUTE MI REVASCULARIZATION N/A 12/08/2018   Procedure: Coronary/Graft Acute MI Revascularization;  Surgeon: Alwyn Pea, MD;  Location: [facility] INVASIVE CV LAB;  Service: Cardiovascular;  Laterality: N/A;  . KIDNEY STONE SURGERY    . left arm surgery  1963  . LEFT HEART CATH AND CORONARY ANGIOGRAPHY N/A 12/08/2018   Procedure: LEFT HEART CATH AND CORONARY ANGIOGRAPHY;  Surgeon: Alwyn Peaallwood, Dwayne D, MD;  Location: [facility] INVASIVE CV LAB;  Service: Cardiovascular;  Laterality: N/A;    Medical History: Past Medical History:  Diagnosis Date  . Arthritis   . COPD (chronic obstructive pulmonary disease) (HCC)   . Diabetes (HCC)   . Hyperlipidemia   . Hypertension   . Kidney stone   . Obesity   . Tachycardia   . TIA (transient  ischemic attack)     Family History: Family History  Family history unknown: Yes    Social History   Socioeconomic History  . Marital status: Married    Spouse name: Not on file  . Number of children: Not on file  . Years of education: Not on file  . Highest education level: Not on file  Occupational History  . Not on file  Social Needs  . Financial resource strain: Not on file  . Food insecurity    Worry: Never true    Inability: Never true  . Transportation needs    Medical: No    Non-medical: No  Tobacco Use  . Smoking status: Current Every Day Smoker    Types: Cigarettes  . Smokeless tobacco: Never Used  . Tobacco comment: pt has cut back on amount and currently using the patch to assist with quiting  Substance and Sexual Activity  . Alcohol use: No  . Drug use: No  . Sexual activity: Not Currently  Lifestyle  . Physical activity    Days per week: 0 days    Minutes per session: Not on file  . Stress: Only a little  Relationships  . Social connections    Talks on phone: More than three times a week    Gets together: Three times a week    Attends religious service: 1 to 4 times per year    Active member of club or organization: No    Attends meetings of clubs or organizations: Never    Relationship status: Married  . Intimate partner violence    Fear of current or ex partner: No    Emotionally abused: No    Physically abused: No    Forced sexual activity: No  Other Topics Concern  . Not on file  Social History Narrative  . Not on file      Review of Systems  Constitutional: Positive for fatigue. Negative for activity change, chills and unexpected weight change.       Recent weight gain of approximately 30 pounds   HENT: Negative for congestion, postnasal drip, rhinorrhea, sinus pressure, sneezing and sore throat.   Respiratory: Positive for shortness of breath. Negative for cough and chest tightness.   Cardiovascular: Negative for chest pain and  palpitations.       Blood pressure well controlled. Sees cardiology routinely.   Gastrointestinal: Negative for abdominal pain, constipation, diarrhea, nausea and vomiting.  Endocrine: Negative for cold intolerance, heat intolerance, polydipsia and polyuria.  Musculoskeletal: Positive for arthralgias and gait problem. Negative for back pain, joint swelling and neck pain.       Chronic right hip pain. Uses rolling walker to help with ambulation.  Skin: Negative for rash.  Allergic/Immunologic: Positive for environmental allergies.  Neurological: Negative for dizziness, tremors, numbness and headaches.  Hematological: Negative for adenopathy. Does not bruise/bleed easily.  Psychiatric/Behavioral: Negative for behavioral problems (Depression), sleep disturbance and suicidal ideas. The patient is not nervous/anxious.     Today's Vitals   09/21/19 1030  BP: 130/72  Pulse: 97  Resp: 16  Temp: (!) 96.9 F (36.1 C)  SpO2: 97%  Weight: (!) 418 lb (189.6 kg)  Height: 6\' 1"  (1.854 m)   Body mass index is 55.15 kg/m.  Physical Exam Vitals signs and nursing note reviewed.  Constitutional:      General: He is not in acute distress.    Appearance: Normal appearance. He is well-developed. He is obese. He is not diaphoretic.  HENT:     Head: Normocephalic and atraumatic.     Mouth/Throat:     Pharynx: No oropharyngeal exudate.  Eyes:     Pupils: Pupils are equal, round, and reactive to light.  Neck:     Musculoskeletal: Normal range of motion and neck supple.     Thyroid: No thyromegaly.     Vascular: No carotid bruit or JVD.     Trachea: No tracheal deviation.  Cardiovascular:     Rate and Rhythm: Normal rate and regular rhythm.     Heart sounds: Normal heart sounds. No murmur. No friction rub. No gallop.   Pulmonary:     Effort: Pulmonary effort is normal. No respiratory distress.     Breath sounds: Wheezing present. No rales.     Comments: Intermittent, congested, non-productive  cough .scant wheezing hear in bilateral lung bases  Chest:     Chest wall: No tenderness.  Abdominal:     General: Bowel sounds are normal.     Palpations: Abdomen is soft.  Musculoskeletal: Normal range of motion.  Lymphadenopathy:     Cervical: No cervical adenopathy.  Skin:    General: Skin is warm and dry.  Neurological:     General: No focal deficit present.     Mental Status: He is alert and oriented to person, place, and time.     Cranial Nerves: No cranial nerve deficit.  Psychiatric:        Behavior: Behavior normal.        Thought Content: Thought content normal.        Judgment: Judgment normal.    Assessment/Plan: 1. Coronary arteriosclerosis Patient should continue to take plavix and crestor every day. Recommended he take CoQ10 with crestor to help reduce bone and muscle pain. Regular visits with cardiology continued as scheduled.  - clopidogrel (PLAVIX) 75 MG tablet; Take 1 tablet (75 mg total) by mouth daily.  Dispense: 90 tablet; Refill: 1  2. Hypertension, unspecified type Stable.  - lisinopril-hydrochlorothiazide (ZESTORETIC) 20-12.5 MG tablet; Take 1 tablet by mouth daily.  Dispense: 90 tablet; Refill: 1 - metoprolol succinate (TOPROL-XL) 50 MG 24 hr tablet; Take 1 tablet (50 mg total) by mouth daily.  Dispense: 90 tablet; Refill: 1  3. Mild intermittent asthma without complication Continue to use rescue inhaler as needed and as prescribed   4. Morbid obesity (HCC) Discussed continuation of increased physical activity and reduced carbohydrates and sugar in the diet. Goal weight is 265.   5. Moderate cigarette smoker Continue to cut down smoking. Recommend he quit as soon as feasible.   General Counseling: Olander verbalizes understanding of the findings of todays visit and agrees with plan of treatment. I have discussed any further diagnostic evaluation that may be needed or ordered today. We also reviewed his medications today. he has been encouraged to  call the office with any questions or  concerns that should arise related to todays visit.  Cardiac risk factor modification:  1. Control blood pressure. 2. Exercise as prescribed. 3. Follow low sodium, low fat diet. and low fat and low cholestrol diet. 4. Take ASA 81mg  once a day. 5. Restricted calories diet to lose weight.  This patient was seen by [place] with Dr [name] as a part of collaborative care agreement  Meds ordered this encounter  Medications  . clopidogrel (PLAVIX) 75 MG tablet    Sig: Take 1 tablet (75 mg total) by mouth daily.    Dispense:  90 tablet    Refill:  1    Changed per cardiology    Order Specific Question:   Supervising Provider    Answer:   [name] [[id number]]  . lisinopril-hydrochlorothiazide (ZESTORETIC) 20-12.5 MG tablet    Sig: Take 1 tablet by mouth daily.    Dispense:  90 tablet    Refill:  1    Order Specific Question:   Supervising Provider    Answer:   [name] [[id number]]  . metoprolol succinate (TOPROL-XL) 50 MG 24 hr tablet    Sig: Take 1 tablet (50 mg total) by mouth daily.    Dispense:  90 tablet    Refill:  1    Order Specific Question:   Supervising Provider    Answer:   [name] [[id number]]    Time spent: 32 Minutes      Dr [name] Internal medicine

## 2019-10-19 ENCOUNTER — Other Ambulatory Visit: Payer: Self-pay

## 2019-10-19 DIAGNOSIS — G8929 Other chronic pain: Secondary | ICD-10-CM

## 2019-10-19 DIAGNOSIS — M25561 Pain in right knee: Secondary | ICD-10-CM

## 2019-10-19 MED ORDER — MELOXICAM 7.5 MG PO TABS: 7.5000 mg | ORAL_TABLET | Freq: Two times a day (BID) | ORAL | 0 refills | Status: DC | PRN

## 2019-11-27 ENCOUNTER — Other Ambulatory Visit: Payer: Self-pay

## 2019-11-27 DIAGNOSIS — I2510 Atherosclerotic heart disease of native coronary artery without angina pectoris: Secondary | ICD-10-CM

## 2019-11-27 MED ORDER — ROSUVASTATIN CALCIUM 20 MG PO TABS: 20.0000 mg | ORAL_TABLET | Freq: Every day | ORAL | 1 refills | Status: DC

## 2020-01-08 ENCOUNTER — Other Ambulatory Visit: Payer: Self-pay

## 2020-01-08 DIAGNOSIS — I2510 Atherosclerotic heart disease of native coronary artery without angina pectoris: Secondary | ICD-10-CM

## 2020-01-08 MED ORDER — CLOPIDOGREL BISULFATE 75 MG PO TABS: 75.0000 mg | ORAL_TABLET | Freq: Every day | ORAL | 1 refills | Status: DC

## 2020-01-18 ENCOUNTER — Ambulatory Visit (INDEPENDENT_AMBULATORY_CARE_PROVIDER_SITE_OTHER): Payer: Medicare Other | Admitting: Nurse Practitioner

## 2020-01-18 ENCOUNTER — Other Ambulatory Visit: Payer: Self-pay

## 2020-01-18 ENCOUNTER — Encounter: Payer: Self-pay | Admitting: Nurse Practitioner

## 2020-01-18 VITALS — HR 79 | Ht 73.0 in | Wt >= 6400 oz

## 2020-01-18 DIAGNOSIS — J4520 Mild intermittent asthma, uncomplicated: Secondary | ICD-10-CM | POA: Diagnosis not present

## 2020-01-18 DIAGNOSIS — I10 Essential (primary) hypertension: Secondary | ICD-10-CM | POA: Diagnosis not present

## 2020-01-18 DIAGNOSIS — Z0001 Encounter for general adult medical examination with abnormal findings: Secondary | ICD-10-CM

## 2020-01-18 DIAGNOSIS — I2510 Atherosclerotic heart disease of native coronary artery without angina pectoris: Secondary | ICD-10-CM

## 2020-01-18 DIAGNOSIS — F17210 Nicotine dependence, cigarettes, uncomplicated: Secondary | ICD-10-CM | POA: Diagnosis not present

## 2020-01-18 DIAGNOSIS — M5441 Lumbago with sciatica, right side: Secondary | ICD-10-CM

## 2020-01-18 MED ORDER — VENTOLIN HFA 108 (90 BASE) MCG/ACT IN AERS: 2.0000 | INHALATION_SPRAY | Freq: Four times a day (QID) | RESPIRATORY_TRACT | 1 refills | Status: DC | PRN

## 2020-01-18 NOTE — Progress Notes (Signed)
[facility] [address] [place], [place] [address]  Internal MEDICINE  Telephone Visit  Patient Name: Thomas Tanner  [id number]  [id number]  Date of Service: 01/18/2020  I connected with the patient at 9:42am by telephone and verified the patients identity using two identifiers.   I discussed the limitations, risks, security and privacy concerns of performing an evaluation and management service by telephone and the availability of in person appointments. I also discussed with the patient that there may be a patient responsible charge related to the service.  The patient expressed understanding and agrees to proceed.    Chief Complaint  Patient presents with  . Telephone Assessment  . Telephone Screen  . Medicare Wellness  . Hypertension  . Quality Metric Gaps    pna vacc    The patient has been contacted via telephone for annual wellness visit due to concerns for spread of novel coronavirus.  The patient states that he is doing well overall. He states that he sits too much. Joints get stiff and want to swell. He gets some swelling in the upper posterior, left thigh. He is able to walk around and stretch it out a bit which doe help. He does not walk well at all. Does use his walker which helps with mobility. Still smoking about 1/2 to 1 pack of cigarettes per day. This is a big improvement from where he started. Was smoking between 2 and 3 cigarettes per day. Will use nicotine patch sometimes. This does help him to cut back a bit more. Still needs to have blood work done. Due to COVID 19, he is afraid to go out ad have extra testing done. He does state that taking rosuvastatin makes his joints hurt. Has tried the statin for a few days in the past and the joint pain did improve, though did not go away completley. Discussed cutting back rosuvastatin to every other day, and changing this medication when seen at his next in-office visit.       Current  Medication: Outpatient Encounter Medications as of 01/18/2020  Medication Sig  . aspirin 81 MG chewable tablet Chew 1 tablet (81 mg total) by mouth daily.  . clopidogrel (PLAVIX) 75 MG tablet Take 1 tablet (75 mg total) by mouth daily.  . cyclobenzaprine (FLEXERIL) 10 MG tablet Take 1 tablet (10 mg total) by mouth 2 (two) times daily as needed for muscle spasms.  . famotidine (PEPCID) 20 MG tablet Take 1 tablet (20 mg total) by mouth 2 (two) times daily.  Marland Kitchen glucose blood test strip 1 each by Other route as needed for other. Use as instructed  . Krill Oil (OMEGA-3) 500 MG CAPS Take 1 tab po daily  . lisinopril-hydrochlorothiazide (ZESTORETIC) 20-12.5 MG tablet Take 1 tablet by mouth daily.  . meloxicam (MOBIC) 7.5 MG tablet Take 1 tablet (7.5 mg total) by mouth 2 (two) times daily as needed for pain.  . metoprolol succinate (TOPROL-XL) 50 MG 24 hr tablet Take 1 tablet (50 mg total) by mouth daily.  . nicotine (NICODERM CQ - DOSED IN MG/24 HOURS) 14 mg/24hr patch Place 1 patch (14 mg total) onto the skin daily.  Letta Pate DELICA LANCETS 33G MISC by Does not apply route.  . rosuvastatin (CRESTOR) 20 MG tablet Take 1 tablet (20 mg total) by mouth daily.  Marland Kitchen triamcinolone cream (KENALOG) 0.5 % Apply 1 application topically 2 (two) times daily.  . VENTOLIN HFA 108 (90 Base) MCG/ACT inhaler Inhale 2 puffs into the  lungs every 6 (six) hours as needed for wheezing or shortness of breath.  . [DISCONTINUED] VENTOLIN HFA 108 (90 Base) MCG/ACT inhaler Inhale 2 puffs into the lungs every 6 (six) hours as needed for wheezing or shortness of breath.   No facility-administered encounter medications on file as of 01/18/2020.    Surgical History: Past Surgical History:  Procedure Laterality Date  . CORONARY/GRAFT ACUTE MI REVASCULARIZATION N/A 12/08/2018   Procedure: Coronary/Graft Acute MI Revascularization;  Surgeon: Alwyn Pea, MD;  Location: [facility] INVASIVE CV LAB;  Service: Cardiovascular;  Laterality:  N/A;  . KIDNEY STONE SURGERY    . left arm surgery  1963  . LEFT HEART CATH AND CORONARY ANGIOGRAPHY N/A 12/08/2018   Procedure: LEFT HEART CATH AND CORONARY ANGIOGRAPHY;  Surgeon: Alwyn Pea, MD;  Location: [facility] INVASIVE CV LAB;  Service: Cardiovascular;  Laterality: N/A;    Medical History: Past Medical History:  Diagnosis Date  . Arthritis   . COPD (chronic obstructive pulmonary disease) (HCC)   . Diabetes (HCC)   . Hyperlipidemia   . Hypertension   . Kidney stone   . Obesity   . Tachycardia   . TIA (transient ischemic attack)     Family History: Family History  Family history unknown: Yes    Social History   Socioeconomic History  . Marital status: Married    Spouse name: Not on file  . Number of children: Not on file  . Years of education: Not on file  . Highest education level: Not on file  Occupational History  . Not on file  Tobacco Use  . Smoking status: Current Every Day Smoker    Packs/day: 0.50    Types: Cigarettes  . Smokeless tobacco: Never Used  . Tobacco comment: pt has cut back on amount and currently using the patch to assist with quiting  Substance and Sexual Activity  . Alcohol use: No  . Drug use: No  . Sexual activity: Not Currently  Other Topics Concern  . Not on file  Social History Narrative  . Not on file   Social Determinants of Health   Financial Resource Strain:   . Difficulty of Paying Living Expenses: Not on file  Food Insecurity:   . Worried About Programme researcher, broadcasting/film/video in the Last Year: Not on file  . Ran Out of Food in the Last Year: Not on file  Transportation Needs:   . Lack of Transportation (Medical): Not on file  . Lack of Transportation (Non-Medical): Not on file  Physical Activity:   . Days of Exercise per Week: Not on file  . Minutes of Exercise per Session: Not on file  Stress:   . Feeling of Stress : Not on file  Social Connections:   . Frequency of Communication with Friends and Family: Not on file   . Frequency of Social Gatherings with Friends and Family: Not on file  . Attends Religious Services: Not on file  . Active Member of Clubs or Organizations: Not on file  . Attends Banker Meetings: Not on file  . Marital Status: Not on file  Intimate Partner Violence:   . Fear of Current or Ex-Partner: Not on file  . Emotionally Abused: Not on file  . Physically Abused: Not on file  . Sexually Abused: Not on file      Review of Systems  Constitutional: Positive for fatigue. Negative for activity change, chills and unexpected weight change.  HENT: Negative for congestion, postnasal drip,  rhinorrhea, sinus pressure, sneezing and sore throat.   Respiratory: Positive for shortness of breath. Negative for cough and chest tightness.        Intermittent shortness of breath and wheezing. Using rescue inhaler much less often   Cardiovascular: Negative for chest pain and palpitations.       Blood pressure well controlled. Sees cardiology routinely.   Gastrointestinal: Negative for abdominal pain, constipation, diarrhea, nausea and vomiting.  Endocrine: Negative for cold intolerance, heat intolerance, polydipsia and polyuria.  Genitourinary: Negative for dysuria, frequency and urgency.  Musculoskeletal: Positive for arthralgias and gait problem. Negative for back pain, joint swelling and neck pain.       Chronic right hip pain. Uses rolling walker to help with ambulation.  Skin: Negative for rash.  Allergic/Immunologic: Positive for environmental allergies.  Neurological: Negative for dizziness, tremors, numbness and headaches.  Hematological: Negative for adenopathy. Does not bruise/bleed easily.  Psychiatric/Behavioral: Negative for behavioral problems (Depression), sleep disturbance and suicidal ideas. The patient is not nervous/anxious.     Today's Vitals   01/18/20 0906  Pulse: 79  SpO2: 95%  Weight: (!) 418 lb (189.6 kg)  Height: 6\' 1"  (1.854 m)   Body mass index is  55.15 kg/m.  Observation/Objective:  The patient is alert and oriented. He is pleasant and answering all questions appropriately. Breathing is non-labored. He is in no acute distress.   Depression screen [facility] 2/9 01/18/2020 09/21/2019 01/15/2019 12/16/2018 08/04/2018  Decreased Interest 0 3 0 0 0  Down, Depressed, Hopeless 1 3 0 0 0  PHQ - 2 Score 1 6 0 0 0  Altered sleeping - 3 - - -  Tired, decreased energy - 3 - - -  Change in appetite - 0 - - -  Feeling bad or failure about yourself  - 0 - - -  Trouble concentrating - 0 - - -  Moving slowly or fidgety/restless - 0 - - -  Suicidal thoughts - 0 - - -  PHQ-9 Score - 12 - - -    Functional Status Survey: Is the patient deaf or have difficulty hearing?: No Does the patient have difficulty seeing, even when wearing glasses/contacts?: No Does the patient have difficulty concentrating, remembering, or making decisions?: No Does the patient have difficulty walking or climbing stairs?: Yes Does the patient have difficulty dressing or bathing?: Yes Does the patient have difficulty doing errands alone such as visiting a doctor's office or shopping?: No  MMSE - [place] Exam 01/18/2020 01/15/2019  Orientation to time 5 5  Orientation to Place 5 5  Registration 3 3  Attention/ Calculation 5 5  Recall 3 3  Language- name 2 objects 2 2  Language- repeat 1 1  Language- follow 3 step command 3 3  Language- read & follow direction 1 1  Write a sentence 1 1  Copy design 1 1  Total score 30 30    Fall Risk  01/18/2020 09/21/2019 05/21/2019 01/15/2019 01/15/2019  Falls in the past year? 0 0 0 0 0     Assessment/Plan: 1. Encounter for general adult medical examination with abnormal findings Annual health maintenance exam today. Patient has lab slip to get routine, fasting labs drawn.   2. Mild intermittent asthma, unspecified whether complicated May use rescue inhaler as needed and as prescribed  - VENTOLIN HFA 108 (90 Base) MCG/ACT  inhaler; Inhale 2 puffs into the lungs every 6 (six) hours as needed for wheezing or shortness of breath.  Dispense: 48  g; Refill: 1  3. Coronary arteriosclerosis shouldcontinue regular visits with cardiology. Will have him take rosuvastatin every other day. Will discuss other options for medication at next, in-office visit.   4. Hypertension, unspecified type Stable. Continue BP medication as prescribed   5. Moderate cigarette smoker Patient has cut back smoking to 1/2 to 1 pack of cigarettes per day. Will continue to try cutting back.   6. Low back pain with right-sided sciatica, unspecified back pain laterality, unspecified chronicity Continue walking and stretching exercises to help reduce pain. Use meloxicam as needed to help reduce pain aind inflammation.  General Counseling: Phillippe verbalizes understanding of the findings of today's phone visit and agrees with plan of treatment. I have discussed any further diagnostic evaluation that may be needed or ordered today. We also reviewed his medications today. he has been encouraged to call the office with any questions or concerns that should arise related to todays visit.   This patient was seen by Vincent Gros FNP Collaboration with Dr Lyndon Code as a part of collaborative care agreement  Meds ordered this encounter  Medications  . VENTOLIN HFA 108 (90 Base) MCG/ACT inhaler    Sig: Inhale 2 puffs into the lungs every 6 (six) hours as needed for wheezing or shortness of breath.    Dispense:  48 g    Refill:  1    Order Specific Question:   Supervising Provider    Answer:   Lyndon Code [[id number]]    Time spent: 30 Minutes  Time spent with patient included reviewing progress notes, labs, imaging studies, and discussing plan for follow up.   Dr Lyndon Code Internal medicine

## 2020-01-22 ENCOUNTER — Ambulatory Visit: Payer: Medicare Other | Admitting: Nurse Practitioner

## 2020-02-08 ENCOUNTER — Other Ambulatory Visit: Payer: Self-pay

## 2020-02-08 DIAGNOSIS — G8929 Other chronic pain: Secondary | ICD-10-CM

## 2020-02-08 DIAGNOSIS — I10 Essential (primary) hypertension: Secondary | ICD-10-CM

## 2020-02-08 MED ORDER — LISINOPRIL-HYDROCHLOROTHIAZIDE 20-12.5 MG PO TABS: 1.0000 | ORAL_TABLET | Freq: Every day | ORAL | 1 refills | Status: DC

## 2020-02-08 MED ORDER — METOPROLOL SUCCINATE ER 50 MG PO TB24: 50.0000 mg | ORAL_TABLET | Freq: Every day | ORAL | 1 refills | Status: DC

## 2020-02-08 MED ORDER — MELOXICAM 7.5 MG PO TABS: 7.5000 mg | ORAL_TABLET | Freq: Two times a day (BID) | ORAL | 0 refills | Status: DC | PRN

## 2020-03-09 ENCOUNTER — Other Ambulatory Visit: Payer: Self-pay

## 2020-03-09 DIAGNOSIS — K219 Gastro-esophageal reflux disease without esophagitis: Secondary | ICD-10-CM

## 2020-03-09 MED ORDER — FAMOTIDINE 20 MG PO TABS: 20.0000 mg | ORAL_TABLET | Freq: Two times a day (BID) | ORAL | 1 refills | Status: DC

## 2020-04-18 ENCOUNTER — Ambulatory Visit: Payer: Medicare Other | Admitting: Nurse Practitioner

## 2020-04-26 ENCOUNTER — Telehealth: Payer: Self-pay

## 2020-04-26 NOTE — Telephone Encounter (Signed)
Confirmed and screened for 04-28-20 ov. 

## 2020-04-27 ENCOUNTER — Telehealth: Payer: Self-pay

## 2020-04-27 NOTE — Telephone Encounter (Signed)
Confirmed virtual visit on 04/28/2020. klh 

## 2020-04-28 ENCOUNTER — Encounter: Payer: Self-pay | Admitting: Nurse Practitioner

## 2020-04-28 ENCOUNTER — Ambulatory Visit (INDEPENDENT_AMBULATORY_CARE_PROVIDER_SITE_OTHER): Payer: Medicare Other | Admitting: Nurse Practitioner

## 2020-04-28 DIAGNOSIS — R0602 Shortness of breath: Secondary | ICD-10-CM | POA: Diagnosis not present

## 2020-04-28 DIAGNOSIS — U071 COVID-19: Secondary | ICD-10-CM | POA: Diagnosis not present

## 2020-04-28 DIAGNOSIS — I2510 Atherosclerotic heart disease of native coronary artery without angina pectoris: Secondary | ICD-10-CM

## 2020-04-28 DIAGNOSIS — J069 Acute upper respiratory infection, unspecified: Secondary | ICD-10-CM

## 2020-04-28 DIAGNOSIS — R609 Edema, unspecified: Secondary | ICD-10-CM

## 2020-04-28 MED ORDER — FUROSEMIDE 20 MG PO TABS: 20.0000 mg | ORAL_TABLET | Freq: Every day | ORAL | 3 refills | Status: DC | PRN

## 2020-04-28 MED ORDER — AZITHROMYCIN 250 MG PO TABS: ORAL_TABLET | ORAL | 0 refills | Status: DC

## 2020-04-28 MED ORDER — PREDNISONE 10 MG (21) PO TBPK: ORAL_TABLET | ORAL | 0 refills | Status: DC

## 2020-04-28 NOTE — Progress Notes (Signed)
[facility] [address] [place], [place] [address]  Internal MEDICINE  Telephone Visit  Patient Name: Thomas Tanner  [id number]  [id number]  Date of Service: 04/28/2020  I connected with the patient at 8:49am by telephone and verified the patients identity using two identifiers.   I discussed the limitations, risks, security and privacy concerns of performing an evaluation and management service by telephone and the availability of in person appointments. I also discussed with the patient that there may be a patient responsible charge related to the service.  The patient expressed understanding and agrees to proceed.    Chief Complaint  Patient presents with  . Telephone Screen    pt exposure to covid   . Telephone Assessment  . Cough  . Headache  . Chills    The patient has been contacted via telephone for follow up visit due to concerns for spread of novel coronavirus. The patient presents for acute visit. He states that his wife had COVID. She tested positive about a month ago. He states that he has also had COVID during this time. He states that he has some lingering symptoms. He has shortness of breath, states that his bones are sore. This is most severe in his legs and feet.  He keeps getting congested, especially when laying down to sleep. Because of this, he has been sleeping in mostly a sitting up position. He states that he already had problems with mobility, but current symptoms are making it worse.  He also states that his testicles are swollen. He also states that feet and lower legs are swollen.  He states that swelling is not painful or uncomfortable. He has just noticed that feet, legs, and testicles are a bit larger than normal.       Current Medication: Outpatient Encounter Medications as of 04/28/2020  Medication Sig  . aspirin 81 MG chewable tablet Chew 1 tablet (81 mg total) by mouth daily.  . clopidogrel (PLAVIX) 75 MG tablet Take 1  tablet (75 mg total) by mouth daily.  . cyclobenzaprine (FLEXERIL) 10 MG tablet Take 1 tablet (10 mg total) by mouth 2 (two) times daily as needed for muscle spasms.  . famotidine (PEPCID) 20 MG tablet Take 1 tablet (20 mg total) by mouth 2 (two) times daily.  Marland Kitchen glucose blood test strip 1 each by Other route as needed for other. Use as instructed  . Krill Oil (OMEGA-3) 500 MG CAPS Take 1 tab po daily  . lisinopril-hydrochlorothiazide (ZESTORETIC) 20-12.5 MG tablet Take 1 tablet by mouth daily.  . meloxicam (MOBIC) 7.5 MG tablet Take 1 tablet (7.5 mg total) by mouth 2 (two) times daily as needed for pain.  . metoprolol succinate (TOPROL-XL) 50 MG 24 hr tablet Take 1 tablet (50 mg total) by mouth daily.  . nicotine (NICODERM CQ - DOSED IN MG/24 HOURS) 14 mg/24hr patch Place 1 patch (14 mg total) onto the skin daily.  Letta Pate DELICA LANCETS 33G MISC by Does not apply route.  . rosuvastatin (CRESTOR) 20 MG tablet Take 1 tablet (20 mg total) by mouth daily.  Marland Kitchen triamcinolone cream (KENALOG) 0.5 % Apply 1 application topically 2 (two) times daily.  . VENTOLIN HFA 108 (90 Base) MCG/ACT inhaler Inhale 2 puffs into the lungs every 6 (six) hours as needed for wheezing or shortness of breath.  Marland Kitchen azithromycin (ZITHROMAX) 250 MG tablet z-pack - take as directed for 5 days  . furosemide (LASIX) 20 MG tablet Take 1 tablet (20  mg total) by mouth daily as needed.  . predniSONE (STERAPRED UNI-PAK 21 TAB) 10 MG (21) TBPK tablet 6 day taper - take by mouth as directed for 6 days   No facility-administered encounter medications on file as of 04/28/2020.    Surgical History: Past Surgical History:  Procedure Laterality Date  . CORONARY/GRAFT ACUTE MI REVASCULARIZATION N/A 12/08/2018   Procedure: Coronary/Graft Acute MI Revascularization;  Surgeon: Alwyn Pea, MD;  Location: [facility] INVASIVE CV LAB;  Service: Cardiovascular;  Laterality: N/A;  . KIDNEY STONE SURGERY    . left arm surgery  1963  . LEFT  HEART CATH AND CORONARY ANGIOGRAPHY N/A 12/08/2018   Procedure: LEFT HEART CATH AND CORONARY ANGIOGRAPHY;  Surgeon: Alwyn Pea, MD;  Location: [facility] INVASIVE CV LAB;  Service: Cardiovascular;  Laterality: N/A;    Medical History: Past Medical History:  Diagnosis Date  . Arthritis   . COPD (chronic obstructive pulmonary disease) (HCC)   . Diabetes (HCC)   . Hyperlipidemia   . Hypertension   . Kidney stone   . Obesity   . Tachycardia   . TIA (transient ischemic attack)     Family History: Family History  Family history unknown: Yes    Social History   Socioeconomic History  . Marital status: Married    Spouse name: Not on file  . Number of children: Not on file  . Years of education: Not on file  . Highest education level: Not on file  Occupational History  . Not on file  Tobacco Use  . Smoking status: Current Every Day Smoker    Packs/day: 0.50    Types: Cigarettes  . Smokeless tobacco: Never Used  . Tobacco comment: pt has cut back on amount and currently using the patch to assist with quiting  Substance and Sexual Activity  . Alcohol use: No  . Drug use: No  . Sexual activity: Not Currently  Other Topics Concern  . Not on file  Social History Narrative  . Not on file   Social Determinants of Health   Financial Resource Strain:   . Difficulty of Paying Living Expenses:   Food Insecurity:   . Worried About Programme researcher, broadcasting/film/video in the Last Year:   . Barista in the Last Year:   Transportation Needs:   . Freight forwarder (Medical):   Marland Kitchen Lack of Transportation (Non-Medical):   Physical Activity:   . Days of Exercise per Week:   . Minutes of Exercise per Session:   Stress:   . Feeling of Stress :   Social Connections:   . Frequency of Communication with Friends and Family:   . Frequency of Social Gatherings with Friends and Family:   . Attends Religious Services:   . Active Member of Clubs or Organizations:   . Attends Tax inspector Meetings:   Marland Kitchen Marital Status:   Intimate Partner Violence:   . Fear of Current or Ex-Partner:   . Emotionally Abused:   Marland Kitchen Physically Abused:   . Sexually Abused:       Review of Systems  Constitutional: Positive for activity change and fatigue. Negative for chills and unexpected weight change.       Problems with reduced activity due to pain and aches in the feet and lower legs.   HENT: Positive for congestion, postnasal drip and rhinorrhea. Negative for sneezing and sore throat.   Respiratory: Positive for shortness of breath and wheezing. Negative for cough and chest  tightness.   Cardiovascular: Positive for leg swelling. Negative for chest pain and palpitations.       Swelling of feet, lower legs, and testicles.   Gastrointestinal: Negative for abdominal pain, constipation, diarrhea, nausea and vomiting.  Genitourinary: Positive for scrotal swelling. Negative for dysuria and frequency.  Musculoskeletal: Positive for arthralgias and myalgias. Negative for back pain, joint swelling and neck pain.  Skin: Negative for rash.  Allergic/Immunologic: Negative for environmental allergies.  Neurological: Negative for dizziness, tremors, numbness and headaches.  Hematological: Negative for adenopathy. Does not bruise/bleed easily.  Psychiatric/Behavioral: Negative for behavioral problems (Depression), sleep disturbance and suicidal ideas. The patient is not nervous/anxious.     Vital Signs: There were no vitals taken for this visit.   Observation/Objective:  The patient is alert and oriented. He is pleasant and answering all questions appropriately. Breathing is non-labored. He is in no acute distress.  The patient sounds congested. Wheezing can be heard through the telephone.    Assessment/Plan: 1. Upper respiratory tract infection due to COVID-19 virus Start z-pack. Take as directed for 5 days. Rest and increase fluids. Take OTC medication as needed and as indicated to  treat acute symptoms.  - azithromycin (ZITHROMAX) 250 MG tablet; z-pack - take as directed for 5 days  Dispense: 6 tablet; Refill: 0  2. Shortness of breath Add prednisone taper. Take as directed for 6 days. Use inhaler as needed and as prescribed  - predniSONE (STERAPRED UNI-PAK 21 TAB) 10 MG (21) TBPK tablet; 6 day taper - take by mouth as directed for 6 days  Dispense: 21 tablet; Refill: 0  3. Edema, unspecified type Swelling present in feet, ankles, and testicles. Add furosemide 20mg  daily as needed. Advised patient to seek emergency care if swelling does not improve over next 24 hours.  - furosemide (LASIX) 20 MG tablet; Take 1 tablet (20 mg total) by mouth daily as needed.  Dispense: 30 tablet; Refill: 3  4. Coronary arteriosclerosis Due to history of positive MI, concern that swelling may be sign of heart failure. Discussed this possibility with patient. He voiced understanding. Agreed to seek emergency care if swelling and shortness of breath did not improve over next 24 hours.   General Counseling: Kealan verbalizes understanding of the findings of today's phone visit and agrees with plan of treatment. I have discussed any further diagnostic evaluation that may be needed or ordered today. We also reviewed his medications today. he has been encouraged to call the office with any questions or concerns that should arise related to todays visit.   This patient was seen by FNP Collaboration with Dr Vincent Gros as a part of collaborative care agreement  Meds ordered this encounter  Medications  . azithromycin (ZITHROMAX) 250 MG tablet    Sig: z-pack - take as directed for 5 days    Dispense:  6 tablet    Refill:  0    Order Specific Question:   Supervising Provider    Answer:   Lyndon Code [[id number]]  . predniSONE (STERAPRED UNI-PAK 21 TAB) 10 MG (21) TBPK tablet    Sig: 6 day taper - take by mouth as directed for 6 days    Dispense:  21 tablet    Refill:  0    Order  Specific Question:   Supervising Provider    Answer:   Lyndon Code [[id number]]  . furosemide (LASIX) 20 MG tablet    Sig: Take 1 tablet (20 mg total) by mouth  daily as needed.    Dispense:  30 tablet    Refill:  3    Order Specific Question:   Supervising Provider    Answer:   [name] [[id number]]    Time spent: 47 Minutes    Dr [name] Internal medicine

## 2020-05-16 ENCOUNTER — Other Ambulatory Visit: Payer: Self-pay

## 2020-05-16 DIAGNOSIS — M25561 Pain in right knee: Secondary | ICD-10-CM

## 2020-05-16 DIAGNOSIS — G8929 Other chronic pain: Secondary | ICD-10-CM

## 2020-05-16 DIAGNOSIS — I2510 Atherosclerotic heart disease of native coronary artery without angina pectoris: Secondary | ICD-10-CM

## 2020-05-16 MED ORDER — ROSUVASTATIN CALCIUM 20 MG PO TABS: 20.0000 mg | ORAL_TABLET | Freq: Every day | ORAL | 1 refills | Status: DC

## 2020-05-16 MED ORDER — MELOXICAM 7.5 MG PO TABS: 7.5000 mg | ORAL_TABLET | Freq: Two times a day (BID) | ORAL | 0 refills | Status: DC | PRN

## 2020-05-24 ENCOUNTER — Inpatient Hospital Stay
Admission: EM | Admit: 2020-05-24 | Discharge: 2020-05-28 | DRG: 292 | Disposition: A | Discharge status: Home Health | Payer: Medicare Other | Attending: Internal Medicine | Admitting: Internal Medicine

## 2020-05-24 ENCOUNTER — Encounter: Payer: Self-pay | Admitting: Medical Oncology

## 2020-05-24 ENCOUNTER — Other Ambulatory Visit: Payer: Self-pay

## 2020-05-24 ENCOUNTER — Emergency Department: Payer: Medicare Other

## 2020-05-24 DIAGNOSIS — Z87442 Personal history of urinary calculi: Secondary | ICD-10-CM

## 2020-05-24 DIAGNOSIS — Z7982 Long term (current) use of aspirin: Secondary | ICD-10-CM | POA: Diagnosis not present

## 2020-05-24 DIAGNOSIS — I5043 Acute on chronic combined systolic (congestive) and diastolic (congestive) heart failure: Secondary | ICD-10-CM | POA: Diagnosis present

## 2020-05-24 DIAGNOSIS — R7989 Other specified abnormal findings of blood chemistry: Secondary | ICD-10-CM | POA: Diagnosis not present

## 2020-05-24 DIAGNOSIS — I5033 Acute on chronic diastolic (congestive) heart failure: Secondary | ICD-10-CM

## 2020-05-24 DIAGNOSIS — I252 Old myocardial infarction: Secondary | ICD-10-CM

## 2020-05-24 DIAGNOSIS — E785 Hyperlipidemia, unspecified: Secondary | ICD-10-CM

## 2020-05-24 DIAGNOSIS — I110 Hypertensive heart disease with heart failure: Principal | ICD-10-CM | POA: Diagnosis present

## 2020-05-24 DIAGNOSIS — I2510 Atherosclerotic heart disease of native coronary artery without angina pectoris: Secondary | ICD-10-CM | POA: Diagnosis not present

## 2020-05-24 DIAGNOSIS — Z6841 Body Mass Index (BMI) 40.0 and over, adult: Secondary | ICD-10-CM | POA: Diagnosis not present

## 2020-05-24 DIAGNOSIS — Z720 Tobacco use: Secondary | ICD-10-CM | POA: Diagnosis not present

## 2020-05-24 DIAGNOSIS — E119 Type 2 diabetes mellitus without complications: Secondary | ICD-10-CM | POA: Diagnosis present

## 2020-05-24 DIAGNOSIS — Z20822 Contact with and (suspected) exposure to covid-19: Secondary | ICD-10-CM | POA: Diagnosis not present

## 2020-05-24 DIAGNOSIS — Z8673 Personal history of transient ischemic attack (TIA), and cerebral infarction without residual deficits: Secondary | ICD-10-CM | POA: Diagnosis not present

## 2020-05-24 DIAGNOSIS — J449 Chronic obstructive pulmonary disease, unspecified: Secondary | ICD-10-CM | POA: Diagnosis not present

## 2020-05-24 DIAGNOSIS — F17210 Nicotine dependence, cigarettes, uncomplicated: Secondary | ICD-10-CM | POA: Diagnosis present

## 2020-05-24 DIAGNOSIS — K219 Gastro-esophageal reflux disease without esophagitis: Secondary | ICD-10-CM | POA: Diagnosis present

## 2020-05-24 DIAGNOSIS — R7301 Impaired fasting glucose: Secondary | ICD-10-CM

## 2020-05-24 DIAGNOSIS — Z79899 Other long term (current) drug therapy: Secondary | ICD-10-CM

## 2020-05-24 DIAGNOSIS — I5023 Acute on chronic systolic (congestive) heart failure: Secondary | ICD-10-CM | POA: Diagnosis not present

## 2020-05-24 DIAGNOSIS — Z8249 Family history of ischemic heart disease and other diseases of the circulatory system: Secondary | ICD-10-CM | POA: Diagnosis not present

## 2020-05-24 DIAGNOSIS — N5089 Other specified disorders of the male genital organs: Secondary | ICD-10-CM | POA: Diagnosis present

## 2020-05-24 DIAGNOSIS — N179 Acute kidney failure, unspecified: Secondary | ICD-10-CM | POA: Diagnosis present

## 2020-05-24 DIAGNOSIS — I5031 Acute diastolic (congestive) heart failure: Secondary | ICD-10-CM | POA: Diagnosis not present

## 2020-05-24 DIAGNOSIS — I509 Heart failure, unspecified: Secondary | ICD-10-CM

## 2020-05-24 DIAGNOSIS — R0902 Hypoxemia: Secondary | ICD-10-CM | POA: Diagnosis not present

## 2020-05-24 DIAGNOSIS — I517 Cardiomegaly: Secondary | ICD-10-CM | POA: Diagnosis not present

## 2020-05-24 DIAGNOSIS — R601 Generalized edema: Secondary | ICD-10-CM | POA: Insufficient documentation

## 2020-05-24 DIAGNOSIS — R739 Hyperglycemia, unspecified: Secondary | ICD-10-CM | POA: Diagnosis not present

## 2020-05-24 DIAGNOSIS — R0602 Shortness of breath: Secondary | ICD-10-CM | POA: Diagnosis not present

## 2020-05-24 DIAGNOSIS — Z7902 Long term (current) use of antithrombotics/antiplatelets: Secondary | ICD-10-CM

## 2020-05-24 DIAGNOSIS — E1165 Type 2 diabetes mellitus with hyperglycemia: Secondary | ICD-10-CM | POA: Diagnosis present

## 2020-05-24 DIAGNOSIS — R7303 Prediabetes: Secondary | ICD-10-CM | POA: Diagnosis not present

## 2020-05-24 DIAGNOSIS — R609 Edema, unspecified: Secondary | ICD-10-CM

## 2020-05-24 DIAGNOSIS — M7989 Other specified soft tissue disorders: Secondary | ICD-10-CM | POA: Diagnosis not present

## 2020-05-24 DIAGNOSIS — Z885 Allergy status to narcotic agent status: Secondary | ICD-10-CM | POA: Diagnosis not present

## 2020-05-24 DIAGNOSIS — R600 Localized edema: Secondary | ICD-10-CM | POA: Diagnosis not present

## 2020-05-24 DIAGNOSIS — I5032 Chronic diastolic (congestive) heart failure: Secondary | ICD-10-CM

## 2020-05-24 LAB — CBC
HCT: 46.9 % (ref 39.0–52.0)
Hemoglobin: 15.5 g/dL (ref 13.0–17.0)
MCH: 31.4 pg (ref 26.0–34.0)
MCHC: 33 g/dL (ref 30.0–36.0)
MCV: 94.9 fL (ref 80.0–100.0)
Platelets: 253 10*3/uL (ref 150–400)
RBC: 4.94 MIL/uL (ref 4.22–5.81)
RDW: 13.6 % (ref 11.5–15.5)
WBC: 10.1 10*3/uL (ref 4.0–10.5)
nRBC: 0 % (ref 0.0–0.2)

## 2020-05-24 LAB — FIBRIN DERIVATIVES D-DIMER (ARMC ONLY): Fibrin derivatives D-dimer (ARMC): 1786.06 ng/mL (FEU) — ABNORMAL HIGH (ref 0.00–499.00)

## 2020-05-24 LAB — BASIC METABOLIC PANEL
Anion gap: 13 (ref 5–15)
BUN: 19 mg/dL (ref 8–23)
CO2: 29 mmol/L (ref 22–32)
Calcium: 9.2 mg/dL (ref 8.9–10.3)
Chloride: 95 mmol/L — ABNORMAL LOW (ref 98–111)
Creatinine, Ser: 1.46 mg/dL — ABNORMAL HIGH (ref 0.61–1.24)
GFR calc Af Amer: 57 mL/min — ABNORMAL LOW (ref >60)
GFR calc non Af Amer: 49 mL/min — ABNORMAL LOW (ref >60)
Glucose, Bld: 134 mg/dL — ABNORMAL HIGH (ref 70–99)
Potassium: 4.1 mmol/L (ref 3.5–5.1)
Sodium: 137 mmol/L (ref 135–145)

## 2020-05-24 LAB — HEPATIC FUNCTION PANEL
ALT: 14 U/L (ref 0–44)
AST: 18 U/L (ref 15–41)
Albumin: 3.8 g/dL (ref 3.5–5.0)
Alkaline Phosphatase: 68 U/L (ref 38–126)
Bilirubin, Direct: 0.1 mg/dL (ref 0.0–0.2)
Indirect Bilirubin: 0.8 mg/dL (ref 0.3–0.9)
Total Bilirubin: 0.9 mg/dL (ref 0.3–1.2)
Total Protein: 7.1 g/dL (ref 6.5–8.1)

## 2020-05-24 LAB — BRAIN NATRIURETIC PEPTIDE: B Natriuretic Peptide: 64.6 pg/mL (ref 0.0–100.0)

## 2020-05-24 LAB — SARS CORONAVIRUS 2 BY RT PCR (HOSPITAL ORDER, PERFORMED IN ~~LOC~~ HOSPITAL LAB): SARS Coronavirus 2: NEGATIVE

## 2020-05-24 LAB — TROPONIN I (HIGH SENSITIVITY): Troponin I (High Sensitivity): 8 ng/L (ref <18)

## 2020-05-24 MED ORDER — ACETAMINOPHEN 325 MG PO TABS: 650.0000 mg | ORAL_TABLET | ORAL | Status: DC | PRN

## 2020-05-24 MED ORDER — SODIUM CHLORIDE 0.9% FLUSH
3.0000 mL | Freq: Two times a day (BID) | INTRAVENOUS | Status: DC
  Administered 2020-05-24 – 2020-05-27 (×7): 3 mL via INTRAVENOUS

## 2020-05-24 MED ORDER — CLOPIDOGREL BISULFATE 75 MG PO TABS
75.0000 mg | ORAL_TABLET | Freq: Every day | ORAL | Status: DC
  Administered 2020-05-25 – 2020-05-28 (×4): 75 mg via ORAL
  Filled 2020-05-24 (×4): qty 1

## 2020-05-24 MED ORDER — ENOXAPARIN SODIUM 40 MG/0.4ML ~~LOC~~ SOLN
40.0000 mg | Freq: Two times a day (BID) | SUBCUTANEOUS | Status: DC
  Administered 2020-05-24 – 2020-05-28 (×8): 40 mg via SUBCUTANEOUS
  Filled 2020-05-24 (×8): qty 0.4

## 2020-05-24 MED ORDER — FAMOTIDINE 20 MG PO TABS
20.0000 mg | ORAL_TABLET | Freq: Two times a day (BID) | ORAL | Status: DC
  Administered 2020-05-24 – 2020-05-28 (×8): 20 mg via ORAL
  Filled 2020-05-24 (×7): qty 1

## 2020-05-24 MED ORDER — ROSUVASTATIN CALCIUM 10 MG PO TABS
20.0000 mg | ORAL_TABLET | Freq: Every day | ORAL | Status: DC
  Administered 2020-05-24 – 2020-05-26 (×3): 20 mg via ORAL
  Filled 2020-05-24 (×2): qty 2
  Filled 2020-05-24 (×2): qty 1

## 2020-05-24 MED ORDER — NICOTINE 14 MG/24HR TD PT24
14.0000 mg | MEDICATED_PATCH | Freq: Every day | TRANSDERMAL | Status: DC
  Administered 2020-05-24 – 2020-05-28 (×5): 14 mg via TRANSDERMAL
  Filled 2020-05-24 (×5): qty 1

## 2020-05-24 MED ORDER — SODIUM CHLORIDE 0.9% FLUSH: 3.0000 mL | INTRAVENOUS | Status: DC | PRN

## 2020-05-24 MED ORDER — ASPIRIN 81 MG PO CHEW
81.0000 mg | CHEWABLE_TABLET | Freq: Every day | ORAL | Status: DC
  Administered 2020-05-25 – 2020-05-28 (×4): 81 mg via ORAL
  Filled 2020-05-24 (×4): qty 1

## 2020-05-24 MED ORDER — POTASSIUM CHLORIDE CRYS ER 20 MEQ PO TBCR
20.0000 meq | EXTENDED_RELEASE_TABLET | Freq: Two times a day (BID) | ORAL | Status: DC
  Administered 2020-05-24 – 2020-05-28 (×8): 20 meq via ORAL
  Filled 2020-05-24 (×8): qty 1

## 2020-05-24 MED ORDER — ONDANSETRON HCL 4 MG/2ML IJ SOLN: 4.0000 mg | Freq: Four times a day (QID) | INTRAMUSCULAR | Status: DC | PRN

## 2020-05-24 MED ORDER — IOHEXOL 350 MG/ML SOLN
75.0000 mL | Freq: Once | INTRAVENOUS | Status: AC | PRN
  Administered 2020-05-24: 75 mL via INTRAVENOUS
  Filled 2020-05-24: qty 75

## 2020-05-24 MED ORDER — METOPROLOL SUCCINATE ER 50 MG PO TB24
50.0000 mg | ORAL_TABLET | Freq: Every day | ORAL | Status: DC
  Administered 2020-05-25 – 2020-05-28 (×4): 50 mg via ORAL
  Filled 2020-05-24 (×4): qty 1

## 2020-05-24 MED ORDER — SODIUM CHLORIDE 0.9 % IV SOLN: 250.0000 mL | INTRAVENOUS | Status: DC | PRN

## 2020-05-24 MED ORDER — FUROSEMIDE 10 MG/ML IJ SOLN
40.0000 mg | Freq: Once | INTRAMUSCULAR | Status: AC
  Administered 2020-05-24: 40 mg via INTRAVENOUS
  Filled 2020-05-24: qty 4

## 2020-05-24 MED ORDER — TRIAMCINOLONE ACETONIDE 0.5 % EX CREA
1.0000 "application " | TOPICAL_CREAM | Freq: Two times a day (BID) | CUTANEOUS | Status: DC
  Administered 2020-05-24 – 2020-05-27 (×6): 1 via TOPICAL
  Filled 2020-05-24 (×2): qty 15

## 2020-05-24 MED ORDER — ALBUTEROL SULFATE (2.5 MG/3ML) 0.083% IN NEBU: 3.0000 mL | INHALATION_SOLUTION | Freq: Four times a day (QID) | RESPIRATORY_TRACT | Status: DC | PRN

## 2020-05-24 MED ORDER — MELOXICAM 7.5 MG PO TABS
7.5000 mg | ORAL_TABLET | Freq: Two times a day (BID) | ORAL | Status: DC | PRN
  Filled 2020-05-24: qty 1

## 2020-05-24 MED ORDER — FUROSEMIDE 10 MG/ML IJ SOLN
40.0000 mg | Freq: Two times a day (BID) | INTRAMUSCULAR | Status: DC
  Administered 2020-05-25 (×2): 40 mg via INTRAVENOUS
  Filled 2020-05-24 (×2): qty 4

## 2020-05-24 MED ORDER — LISINOPRIL 10 MG PO TABS
10.0000 mg | ORAL_TABLET | Freq: Every day | ORAL | Status: DC
  Administered 2020-05-25 – 2020-05-28 (×4): 10 mg via ORAL
  Filled 2020-05-24 (×4): qty 1

## 2020-05-24 NOTE — ED Notes (Signed)
Rainbow sent to the lab.  

## 2020-05-24 NOTE — H&P (Signed)
[facility] [place] at [facility]   PATIENT NAME: Thomas Tanner    MR#:  [id number]  DATE OF BIRTH:  September 19, 1951  DATE OF ADMISSION:  05/24/2020  PRIMARY CARE PHYSICIAN: [facility]   REQUESTING/REFERRING PHYSICIAN: Dr Shaune Pollack  CHIEF COMPLAINT:   Chief Complaint  Patient presents with  . Leg Swelling    HISTORY OF PRESENT ILLNESS:  Thomas Tanner  is a 69 y.o. male coming in with leg swelling, weight gain, stomach and scrotum swelling and also shortness of breath.  He states sometimes he wheezes.  He was placed on Lasix 20 mg daily but has not helped.  He thinks he has gained some weight but has not measured.  Hospitalist services were contacted for admission for congestive heart failure.  Over a month ago he stated his wife was diagnosed with Covid and he thinks he had the same symptoms but was never diagnosed.  PAST MEDICAL HISTORY:   Past Medical History:  Diagnosis Date  . Arthritis   . COPD (chronic obstructive pulmonary disease) (HCC)   . Diabetes (HCC)   . Hyperlipidemia   . Hypertension   . Kidney stone   . Obesity   . Tachycardia   . TIA (transient ischemic attack)     PAST SURGICAL HISTORY:   Past Surgical History:  Procedure Laterality Date  . CORONARY/GRAFT ACUTE MI REVASCULARIZATION N/A 12/08/2018   Procedure: Coronary/Graft Acute MI Revascularization;  Surgeon: Alwyn Pea, MD;  Location: [facility] INVASIVE CV LAB;  Service: Cardiovascular;  Laterality: N/A;  . KIDNEY STONE SURGERY    . left arm surgery  1963  . LEFT HEART CATH AND CORONARY ANGIOGRAPHY N/A 12/08/2018   Procedure: LEFT HEART CATH AND CORONARY ANGIOGRAPHY;  Surgeon: Alwyn Pea, MD;  Location: [facility] INVASIVE CV LAB;  Service: Cardiovascular;  Laterality: N/A;    SOCIAL HISTORY:   Social History   Tobacco Use  . Smoking status: Current Every Day Smoker    Packs/day: 0.50    Types: Cigarettes  . Smokeless tobacco: Never Used  .  Tobacco comment: pt has cut back on amount and currently using the patch to assist with quiting  Substance Use Topics  . Alcohol use: No    FAMILY HISTORY:   Family History  Problem Relation Age of Onset  . Alzheimer's disease Mother   . CAD Father     DRUG ALLERGIES:   Allergies  Allergen Reactions  . Morphine Anxiety and Other (See Comments)  . Morphine And Related Anxiety    REVIEW OF SYSTEMS:  CONSTITUTIONAL: No fever, chills or sweats.  Positive for weight gain.  Positive for fatigue.  EYES: No blurred or double vision.  EARS, NOSE, AND THROAT: No tinnitus or ear pain. No sore throat RESPIRATORY: Positive for cough whitish phlegm.  Positive for shortness of breath.  Positive for wheezing.  CARDIOVASCULAR: No chest pain.  Positive for orthopnea and edema.  GASTROINTESTINAL: No nausea, vomiting, diarrhea or abdominal pain. No blood in bowel movements GENITOURINARY: No dysuria, hematuria.  ENDOCRINE: No polyuria, nocturia,  HEMATOLOGY: No anemia, easy bruising or bleeding SKIN: No rash or lesion. MUSCULOSKELETAL: No joint pain or arthritis.   NEUROLOGIC: No tingling, numbness, weakness.  PSYCHIATRY: Some depression.   MEDICATIONS AT HOME:   Prior to Admission medications   Medication Sig Start Date End Date Taking? Authorizing Provider  aspirin 81 MG chewable tablet Chew 1 tablet (81 mg total) by mouth daily. 06/03/19   Carlean Jews, NP  clopidogrel (PLAVIX) 75 MG tablet Take 1 tablet (75 mg total) by mouth daily. 01/08/20   Carlean Jews, NP  famotidine (PEPCID) 20 MG tablet Take 1 tablet (20 mg total) by mouth 2 (two) times daily. 03/09/20   Carlean Jews, NP  furosemide (LASIX) 20 MG tablet Take 1 tablet (20 mg total) by mouth daily as needed. 04/28/20   Carlean Jews, NP  glucose blood test strip 1 each by Other route as needed for other. Use as instructed    [provider]  [facility] Lanius (OMEGA-3) 500 MG CAPS Take 1 tab po daily 06/04/19   Carlean Jews, NP  lisinopril-hydrochlorothiazide (ZESTORETIC) 20-12.5 MG tablet Take 1 tablet by mouth daily. 02/08/20   Carlean Jews, NP  meloxicam (MOBIC) 7.5 MG tablet Take 1 tablet (7.5 mg total) by mouth 2 (two) times daily as needed for pain. 05/16/20   Carlean Jews, NP  metoprolol succinate (TOPROL-XL) 50 MG 24 hr tablet Take 1 tablet (50 mg total) by mouth daily. 02/08/20   Carlean Jews, NP  nicotine (NICODERM CQ - DOSED IN MG/24 HOURS) 14 mg/24hr patch Place 1 patch (14 mg total) onto the skin daily. 12/11/18   Salary, Evelena Asa, MD  [facility] DELICA LANCETS 33G MISC by Does not apply route.    [provider]  rosuvastatin (CRESTOR) 20 MG tablet Take 1 tablet (20 mg total) by mouth daily. 05/16/20   Carlean Jews, NP  triamcinolone cream (KENALOG) 0.5 % Apply 1 application topically 2 (two) times daily.    [provider]  VENTOLIN HFA 108 (90 Base) MCG/ACT inhaler Inhale 2 puffs into the lungs every 6 (six) hours as needed for wheezing or shortness of breath. 01/18/20   Carlean Jews, NP      VITAL SIGNS:  Blood pressure (!) 158/79, pulse 94, temperature 98 F (36.7 C), temperature source Oral, resp. rate 20, height 6\' 1"  (1.854 m), weight (!) 189 kg, SpO2 94 %.  PHYSICAL EXAMINATION:  GENERAL:  69 y.o.-year-old patient lying in the bed with no acute distress.  EYES: Pupils equal, round, reactive to light and accommodation. No scleral icterus. Extraocular muscles intact.  HEENT: Head atraumatic, normocephalic. Oropharynx and nasopharynx clear.  NECK:  Supple, no jugular venous distention. No thyroid enlargement, no tenderness.  LUNGS: Decreased breath sounds bilaterally, upper airway audible wheezing.positive rales at the bases. No use of accessory muscles of respiration.  CARDIOVASCULAR: S1, S2 normal. No murmurs, rubs, or gallops.  ABDOMEN: Soft, nontender, nondistended. Bowel sounds present. No organomegaly or mass.  EXTREMITIES: 3+ pedal edema.   NEUROLOGIC: Cranial nerves II through XII are intact. Muscle strength 5/5 in all extremities. Sensation intact. Gait not checked.  PSYCHIATRIC: The patient is alert and oriented x 3.  SKIN: Chronic lower extremity the skin discoloration  LABORATORY PANEL:   CBC Recent Labs  Lab 05/24/20 0945  WBC 10.1  HGB 15.5  HCT 46.9  PLT 253   ------------------------------------------------------------------------------------------------------------------  Chemistries  Recent Labs  Lab 05/24/20 0945  NA 137  K 4.1  CL 95*  CO2 29  GLUCOSE 134*  BUN 19  CREATININE 1.46*  CALCIUM 9.2  AST 18  ALT 14  ALKPHOS 68  BILITOT 0.9   ------------------------------------------------------------------------------------------------------------------  Cardiac Enzymes Troponin 8  RADIOLOGY:  DG Chest 2 View  Result Date: 05/24/2020 CLINICAL DATA:  Shortness of breath. EXAM: CHEST - 2 VIEW COMPARISON:  12/08/2018. FINDINGS: Mild cardiomegaly with bilateral pulmonary interstitial prominence consistent  with CHF. Pneumonitis cannot be excluded. No pleural effusion or pneumothorax. Degenerative change thoracic spine. IMPRESSION: Mild cardiomegaly. Bilateral interstitial prominence suggesting CHF. Pneumonitis cannot be excluded. Electronically Signed   By: Marcello Moores  Register   On: 05/24/2020 10:20   CT Angio Chest PE W and/or Wo Contrast  Result Date: 05/24/2020 CLINICAL DATA:  Increasing lower extremity edema for 1 month, recent COVID-19, elevated D-dimer, decreased renal function EXAM: CT ANGIOGRAPHY CHEST WITH CONTRAST TECHNIQUE: Multidetector CT imaging of the chest was performed using the standard protocol during bolus administration of intravenous contrast. Multiplanar CT image reconstructions and MIPs were obtained to evaluate the vascular anatomy. CONTRAST:  45mL OMNIPAQUE IOHEXOL 350 MG/ML SOLN COMPARISON:  05/24/2020 FINDINGS: Cardiovascular: This is a technically nondiagnostic evaluation of  the pulmonary vasculature due to timing of the imaging after contrast bolus. There is insufficient enhancement of the pulmonary arterial system, and pulmonary emboli cannot be excluded. The thoracic aorta demonstrates normal enhancement with no aneurysm or dissection. Moderate atherosclerosis of the aorta and coronary vasculature. The heart is not enlarged.  No pericardial effusion. Mediastinum/Nodes: No enlarged mediastinal, hilar, or axillary lymph nodes. Thyroid gland, trachea, and esophagus demonstrate no significant findings. Lungs/Pleura: No acute airspace disease, effusion, or pneumothorax. Central airways are widely patent. Upper Abdomen: No acute abnormality. 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 No acute or destructive bony lesions. Reconstructed images demonstrate no additional findings. Review of the MIP images confirms the above findings. IMPRESSION: 1. Nondiagnostic evaluation of the pulmonary vasculature due to poor timing of the contrast bolus. No additional contrast was administered due to patient's underlying renal dysfunction. Pulmonary emboli cannot be excluded, and if pulmonary emboli remain a clinical concern nuclear medicine ventilation/perfusion scan could be considered. 2. No acute intrathoracic process. 3. Aortic Atherosclerosis (ICD10-I70.0). Coronary artery atherosclerosis. Electronically Signed   By: Randa Ngo M.D.   On: 05/24/2020 16:09   US Venous Img Lower Bilateral  Result Date: 05/24/2020 CLINICAL DATA:  Bilateral lower extremity swelling 3 weeks EXAM: BILATERAL LOWER EXTREMITY VENOUS DOPPLER ULTRASOUND TECHNIQUE: Gray-scale sonography with graded compression, as well as color Doppler and duplex ultrasound were performed to evaluate the lower extremity deep venous systems from the level of the common femoral vein and including the common femoral, femoral, profunda femoral, popliteal and calf veins including the posterior  tibial, peroneal and gastrocnemius veins when visible. The superficial great saphenous vein was also interrogated. Spectral Doppler was utilized to evaluate flow at rest and with distal augmentation maneuvers in the common femoral, femoral and popliteal veins. COMPARISON:  11/09/2011 FINDINGS: RIGHT LOWER EXTREMITY Common Femoral Vein: No evidence of thrombus. Normal compressibility, respiratory phasicity and response to augmentation. Saphenofemoral Junction: No evidence of thrombus. Normal compressibility and flow on color Doppler imaging. Profunda Femoral Vein: No evidence of thrombus. Normal compressibility and flow on color Doppler imaging. Femoral Vein: No evidence of thrombus. Normal compressibility, respiratory phasicity and response to augmentation. Popliteal Vein: No evidence of thrombus. Normal compressibility, respiratory phasicity and response to augmentation. Calf Veins: No evidence of thrombus. Normal compressibility and flow on color Doppler imaging. Superficial Great Saphenous Vein: No evidence of thrombus. Normal compressibility. Venous Reflux:  None. Other Findings:  None. LEFT LOWER EXTREMITY Common Femoral Vein: No evidence of thrombus. Normal compressibility, respiratory phasicity and response to augmentation. Saphenofemoral Junction: No evidence of thrombus. Normal compressibility and flow on color Doppler imaging. Profunda Femoral Vein: No evidence of thrombus. Normal compressibility and flow on color Doppler imaging. Femoral Vein: No evidence  of thrombus. Normal compressibility, respiratory phasicity and response to augmentation. Popliteal Vein: No evidence of thrombus. Normal compressibility, respiratory phasicity and response to augmentation. Calf Veins: No evidence of thrombus. Normal compressibility and flow on color Doppler imaging. Superficial Great Saphenous Vein: No evidence of thrombus. Normal compressibility. Venous Reflux:  None. Other Findings:  None. IMPRESSION: No evidence of  deep venous thrombosis in either lower extremity. Electronically Signed   By: Duanne Guess D.O.   On: 05/24/2020 16:42    EKG:   Interpreted by me: Normal sinus rhythm 93 bpm, first-degree AV block  IMPRESSION AND PLAN:   1.  Acute congestive heart failure with anasarca.  Echocardiogram to check EF.  Patient given 40 mg of IV Lasix in the emergency room and will continue 40 mg IV twice daily.  Monitor urine output and can consider Lasix drip.  Patient already on metoprolol and lisinopril.  Will likely need a higher dose of Lasix upon getting out of the hospital.  Discontinue hydrochlorothiazide. 2.  Morbid obesity with a BMI of 54.97.  Weight loss needed 3.  Impaired fasting glucose check a hemoglobin A1c 4.  History of CAD on aspirin, Plavix, lisinopril, Toprol and Crestor.  Check lipid profile tomorrow morning. 5.  Elevated D-dimer.  Ultrasound lower extremity negative.  CAT scan of the chest nondiagnostic. 6.  Acute kidney injury.  Monitor closely with diuresis and also received CAT scan contrast. 7.  Hyperlipidemia unspecified on Crestor.  Check a lipid profile 8.  COPD with slight wheeze and tobacco abuse.  Nicotine patch ordered.  Continue inhalers. 9.  Consider outpatient sleep study.  All the records, laboratory and radiological data are reviewed and case discussed with ED provider. Management plans discussed with the patient, and he is in agreement.  Patient requires inpatient status admitted for acute congestive heart failure and anasarca.  CODE STATUS: Full code  TOTAL TIME TAKING CARE OF THIS PATIENT: 50 minutes.    Alford Highland M.D on 05/24/2020 at 5:29 PM  Between 7am to 6pm - Pager - [phone]  After 6pm call admission pager [phone]  [facility]  CC: Primary care physician; [facility]

## 2020-05-24 NOTE — ED Notes (Signed)
DOE noted when patient attempting to sit on side of bed to use urinal.  RR 38  Expiratory wheezing audible.  Face flushed.  Resolved with rest, but recovery was extended.  Dyspnea also noted with prolonged speech.

## 2020-05-24 NOTE — ED Provider Notes (Addendum)
[facility] Emergency Department Provider Note  ____________________________________________   First MD Initiated Contact with Patient 05/24/20 1355     (approximate)  I have reviewed the triage vital signs and the nursing notes.   HISTORY  Chief Complaint Leg Swelling    HPI Thomas Tanner is a 69 y.o. male with past medical history of COPD, hypertension, hyperlipidemia, morbid obesity, coronary disease status post STEMI, mild CHF, here with leg swelling, shortness of breath, and general fatigue.  Patient states that symptoms started approximately 2 weeks ago.  He states that he began to develop progressively worsening bilateral lower extremity swelling and edema.  He has had associated swelling in his testicles as well as cough and dyspnea with orthopnea.  He called his PCP, who called him in Lasix which she has been taking.  He has not had any significant improvement on the Lasix.  He states that over the last several days, he has had worsening shortness of breath, fatigue, difficulty getting around to this.  Subsequent presents for evaluation.  He is noticed some swelling in his penis and scrotum as well, as well as swelling in his foreskin which he feels like it is occasionally blocking his urine stream though he denies any sensation that he is not emptying completely.  Denies known history of CHF but does have history of STEMI in the past as well as reduced EF on echocardiogram from 2019.  Denies any fevers.  Of note, he did have Covid prior to the symptoms approximately a month ago.  He states he manages at home but did feel markedly short of breath during his bout with Covid.        Past Medical History:  Diagnosis Date  . Arthritis   . COPD (chronic obstructive pulmonary disease) (HCC)   . Diabetes (HCC)   . Hyperlipidemia   . Hypertension   . Kidney stone   . Obesity   . Tachycardia   . TIA (transient ischemic attack)     Patient  Active Problem List   Diagnosis Date Noted  . CHF (congestive heart failure) (HCC) 05/24/2020  . Anasarca   . AKI (acute kidney injury) (HCC)   . Hyperlipidemia   . Tobacco abuse   . Shortness of breath 04/28/2020  . Upper respiratory tract infection due to COVID-19 virus 04/28/2020  . Edema 04/28/2020  . Encounter for general adult medical examination with abnormal findings 01/15/2019  . Acute non-recurrent maxillary sinusitis 01/15/2019  . Gastroesophageal reflux disease without esophagitis 01/15/2019  . CAD in native artery 12/16/2018  . Morbid obesity with BMI of 50.0-59.9, adult (HCC) 12/16/2018  . Moderate cigarette smoker 12/16/2018  . ST elevation myocardial infarction (STEMI) (HCC) 12/08/2018  . Chronic pain of right knee 08/04/2018  . Mild intermittent asthma 08/04/2018  . Impaired fasting glucose 04/03/2018  . Low back pain with right-sided sciatica 03/05/2018  . Chronic right-sided low back pain with right-sided sciatica 03/05/2018  . Pain in right hip 03/05/2018  . Hypertension 03/05/2018  . Foreign body in bladder and urethra 06/09/2013  . Kidney stone 06/09/2013  . Ureteric stone 06/09/2013  . Kidney stone 06/09/2013    Past Surgical History:  Procedure Laterality Date  . CORONARY/GRAFT ACUTE MI REVASCULARIZATION N/A 12/08/2018   Procedure: Coronary/Graft Acute MI Revascularization;  Surgeon: Alwyn Peaallwood, Dwayne D, MD;  Location: [facility] INVASIVE CV LAB;  Service: Cardiovascular;  Laterality: N/A;  . KIDNEY STONE SURGERY    . left arm surgery  1963  .  LEFT HEART CATH AND CORONARY ANGIOGRAPHY N/A 12/08/2018   Procedure: LEFT HEART CATH AND CORONARY ANGIOGRAPHY;  Surgeon: Alwyn Pea, MD;  Location: [facility] INVASIVE CV LAB;  Service: Cardiovascular;  Laterality: N/A;    Prior to Admission medications   Medication Sig Start Date End Date Taking? Authorizing Provider  aspirin 81 MG chewable tablet Chew 1 tablet (81 mg total) by mouth daily. 06/03/19   Carlean Jews, NP  clopidogrel (PLAVIX) 75 MG tablet Take 1 tablet (75 mg total) by mouth daily. 01/08/20   Carlean Jews, NP  famotidine (PEPCID) 20 MG tablet Take 1 tablet (20 mg total) by mouth 2 (two) times daily. 03/09/20   Carlean Jews, NP  furosemide (LASIX) 20 MG tablet Take 1 tablet (20 mg total) by mouth daily as needed. 04/28/20   Carlean Jews, NP  glucose blood test strip 1 each by Other route as needed for other. Use as instructed    [provider]  [facility] (OMEGA-3) 500 MG CAPS Take 1 tab po daily 06/04/19   Carlean Jews, NP  lisinopril-hydrochlorothiazide (ZESTORETIC) 20-12.5 MG tablet Take 1 tablet by mouth daily. 02/08/20   Carlean Jews, NP  meloxicam (MOBIC) 7.5 MG tablet Take 1 tablet (7.5 mg total) by mouth 2 (two) times daily as needed for pain. 05/16/20   Carlean Jews, NP  metoprolol succinate (TOPROL-XL) 50 MG 24 hr tablet Take 1 tablet (50 mg total) by mouth daily. 02/08/20   Carlean Jews, NP  nicotine (NICODERM CQ - DOSED IN MG/24 HOURS) 14 mg/24hr patch Place 1 patch (14 mg total) onto the skin daily. 12/11/18   Salary, Evelena Asa, MD  [facility] DELICA LANCETS 33G MISC by Does not apply route.    [provider]  rosuvastatin (CRESTOR) 20 MG tablet Take 1 tablet (20 mg total) by mouth daily. 05/16/20   Carlean Jews, NP  triamcinolone cream (KENALOG) 0.5 % Apply 1 application topically 2 (two) times daily.    [provider]  VENTOLIN HFA 108 (90 Base) MCG/ACT inhaler Inhale 2 puffs into the lungs every 6 (six) hours as needed for wheezing or shortness of breath. 01/18/20   Carlean Jews, NP    Allergies Morphine and Morphine and related  Family History  Problem Relation Age of Onset  . Alzheimer's disease Mother   . CAD Father     Social History Social History   Tobacco Use  . Smoking status: Current Every Day Smoker    Packs/day: 0.50    Types: Cigarettes  . Smokeless tobacco: Never Used  . Tobacco comment: pt has  cut back on amount and currently using the patch to assist with quiting  Substance Use Topics  . Alcohol use: No  . Drug use: No    Review of Systems  Review of Systems  Constitutional: Positive for fatigue. Negative for chills and fever.  HENT: Negative for sore throat.   Respiratory: Positive for cough and shortness of breath.   Cardiovascular: Positive for leg swelling. Negative for chest pain.  Gastrointestinal: Negative for abdominal pain.  Genitourinary: Positive for penile swelling and scrotal swelling. Negative for flank pain.  Musculoskeletal: Negative for neck pain.  Skin: Negative for rash and wound.  Allergic/Immunologic: Negative for immunocompromised state.  Neurological: Negative for weakness and numbness.  Hematological: Does not bruise/bleed easily.  All other systems reviewed and are negative.    ____________________________________________  PHYSICAL EXAM:      VITAL  SIGNS: ED Triage Vitals  Enc Vitals Group     BP 05/24/20 0942 (!) 158/79     Pulse Rate 05/24/20 0942 94     Resp 05/24/20 0942 20     Temp 05/24/20 0942 98 F (36.7 C)     Temp Source 05/24/20 0942 Oral     SpO2 05/24/20 0942 94 %     Weight 05/24/20 0943 (!) 416 lb 10.7 oz (189 kg)     Height 05/24/20 0943 6\' 1"  (1.854 m)     Head Circumference --      Peak Flow --      Pain Score 05/24/20 0942 0     Pain Loc --      Pain Edu? --      Excl. in GC? --      Physical Exam Vitals and nursing note reviewed.  Constitutional:      General: He is not in acute distress.    Appearance: He is well-developed.  HENT:     Head: Normocephalic and atraumatic.  Eyes:     Conjunctiva/sclera: Conjunctivae normal.  Cardiovascular:     Rate and Rhythm: Normal rate and regular rhythm.     Heart sounds: Normal heart sounds. No murmur heard.  No friction rub.  Pulmonary:     Effort: Pulmonary effort is normal. Tachypnea present. No respiratory distress.     Breath sounds: Examination of the  right-middle field reveals rales. Examination of the left-middle field reveals rales. Examination of the right-lower field reveals rales. Examination of the left-lower field reveals rales. Decreased breath sounds, wheezing and rales present.     Comments: Increased WOB, speaking in short sentences only Abdominal:     General: There is no distension.     Palpations: Abdomen is soft.     Tenderness: There is no abdominal tenderness.  Musculoskeletal:     Cervical back: Neck supple.     Right lower leg: Edema present.     Left lower leg: Edema present.  Skin:    General: Skin is warm.     Capillary Refill: Capillary refill takes less than 2 seconds.  Neurological:     Mental Status: He is alert and oriented to person, place, and time.     Motor: No abnormal muscle tone.       ____________________________________________   LABS (all labs ordered are listed, but only abnormal results are displayed)  Labs Reviewed  BASIC METABOLIC PANEL - Abnormal; Notable for the following components:      Result Value   Chloride 95 (*)    Glucose, Bld 134 (*)    Creatinine, Ser 1.46 (*)    GFR calc non Af Amer 49 (*)    GFR calc Af Amer 57 (*)    All other components within normal limits  FIBRIN DERIVATIVES D-DIMER ([facility] ONLY) - Abnormal; Notable for the following components:   Fibrin derivatives D-dimer ([facility]) 1,786.06 (*)    All other components within normal limits  SARS CORONAVIRUS 2 BY RT PCR (HOSPITAL ORDER, PERFORMED IN [facility])  CBC  BRAIN NATRIURETIC PEPTIDE  HEPATIC FUNCTION PANEL  HIV ANTIBODY (ROUTINE TESTING W REFLEX)  BASIC METABOLIC PANEL  TROPONIN I (HIGH SENSITIVITY)    ____________________________________________  EKG: Normal sinus rhythm, ventricular rate 93.  QRS 108, QTc 432.  No acute ST elevations or depressions.  No EKG evidence of acute ischemia or infarct. ________________________________________  RADIOLOGY All imaging, including plain films,  CT scans, and ultrasounds,  independently reviewed by me, and interpretations confirmed via formal radiology reads.  ED MD interpretation:   CXR: CXR findings of CHF  Official radiology report(s): DG Chest 2 View  Result Date: 05/24/2020 CLINICAL DATA:  Shortness of breath. EXAM: CHEST - 2 VIEW COMPARISON:  12/08/2018. FINDINGS: Mild cardiomegaly with bilateral pulmonary interstitial prominence consistent with CHF. Pneumonitis cannot be excluded. No pleural effusion or pneumothorax. Degenerative change thoracic spine. IMPRESSION: Mild cardiomegaly. Bilateral interstitial prominence suggesting CHF. Pneumonitis cannot be excluded. Electronically Signed   By: Maisie Fus  Register   On: 05/24/2020 10:20   CT Angio Chest PE W and/or Wo Contrast  Result Date: 05/24/2020 CLINICAL DATA:  Increasing lower extremity edema for 1 month, recent COVID-19, elevated D-dimer, decreased renal function EXAM: CT ANGIOGRAPHY CHEST WITH CONTRAST TECHNIQUE: Multidetector CT imaging of the chest was performed using the standard protocol during bolus administration of intravenous contrast. Multiplanar CT image reconstructions and MIPs were obtained to evaluate the vascular anatomy. CONTRAST:  75mL OMNIPAQUE IOHEXOL 350 MG/ML SOLN COMPARISON:  05/24/2020 FINDINGS: Cardiovascular: This is a technically nondiagnostic evaluation of the pulmonary vasculature due to timing of the imaging after contrast bolus. There is insufficient enhancement of the pulmonary arterial system, and pulmonary emboli cannot be excluded. The thoracic aorta demonstrates normal enhancement with no aneurysm or dissection. Moderate atherosclerosis of the aorta and coronary vasculature. The heart is not enlarged.  No pericardial effusion. Mediastinum/Nodes: No enlarged mediastinal, hilar, or axillary lymph nodes. Thyroid gland, trachea, and esophagus demonstrate no significant findings. Lungs/Pleura: No acute airspace disease, effusion, or pneumothorax. Central  airways are widely patent. Upper Abdomen: No acute abnormality. 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 No acute or destructive bony lesions. Reconstructed images demonstrate no additional findings. Review of the MIP images confirms the above findings. IMPRESSION: 1. Nondiagnostic evaluation of the pulmonary vasculature due to poor timing of the contrast bolus. No additional contrast was administered due to patient's underlying renal dysfunction. Pulmonary emboli cannot be excluded, and if pulmonary emboli remain a clinical concern nuclear medicine ventilation/perfusion scan could be considered. 2. No acute intrathoracic process. 3. Aortic Atherosclerosis (ICD10-I70.0). Coronary artery atherosclerosis. Electronically Signed   By: Sharlet Salina M.D.   On: 05/24/2020 16:09   US Venous Img Lower Bilateral  Result Date: 05/24/2020 CLINICAL DATA:  Bilateral lower extremity swelling 3 weeks EXAM: BILATERAL LOWER EXTREMITY VENOUS DOPPLER ULTRASOUND TECHNIQUE: Gray-scale sonography with graded compression, as well as color Doppler and duplex ultrasound were performed to evaluate the lower extremity deep venous systems from the level of the common femoral vein and including the common femoral, femoral, profunda femoral, popliteal and calf veins including the posterior tibial, peroneal and gastrocnemius veins when visible. The superficial great saphenous vein was also interrogated. Spectral Doppler was utilized to evaluate flow at rest and with distal augmentation maneuvers in the common femoral, femoral and popliteal veins. COMPARISON:  11/09/2011 FINDINGS: RIGHT LOWER EXTREMITY Common Femoral Vein: No evidence of thrombus. Normal compressibility, respiratory phasicity and response to augmentation. Saphenofemoral Junction: No evidence of thrombus. Normal compressibility and flow on color Doppler imaging. Profunda Femoral Vein: No evidence of thrombus. Normal  compressibility and flow on color Doppler imaging. Femoral Vein: No evidence of thrombus. Normal compressibility, respiratory phasicity and response to augmentation. Popliteal Vein: No evidence of thrombus. Normal compressibility, respiratory phasicity and response to augmentation. Calf Veins: No evidence of thrombus. Normal compressibility and flow on color Doppler imaging. Superficial Great Saphenous Vein: No evidence of thrombus. Normal compressibility. Venous Reflux:  None. Other Findings:  None. LEFT LOWER EXTREMITY Common Femoral Vein: No evidence of thrombus. Normal compressibility, respiratory phasicity and response to augmentation. Saphenofemoral Junction: No evidence of thrombus. Normal compressibility and flow on color Doppler imaging. Profunda Femoral Vein: No evidence of thrombus. Normal compressibility and flow on color Doppler imaging. Femoral Vein: No evidence of thrombus. Normal compressibility, respiratory phasicity and response to augmentation. Popliteal Vein: No evidence of thrombus. Normal compressibility, respiratory phasicity and response to augmentation. Calf Veins: No evidence of thrombus. Normal compressibility and flow on color Doppler imaging. Superficial Great Saphenous Vein: No evidence of thrombus. Normal compressibility. Venous Reflux:  None. Other Findings:  None. IMPRESSION: No evidence of deep venous thrombosis in either lower extremity. Electronically Signed   By: Davina Poke D.O.   On: 05/24/2020 16:42    ____________________________________________  PROCEDURES   Procedure(s) performed (including Critical Care):  .1-3 Lead EKG Interpretation Performed by: Duffy Bruce, MD Authorized by: Duffy Bruce, MD     Interpretation: normal     ECG rate:  90-100   ECG rate assessment: normal     Rhythm: sinus rhythm     Ectopy: none     Conduction: normal   Comments:     Indication: SOB    ____________________________________________  INITIAL IMPRESSION  / MDM / ASSESSMENT AND PLAN / ED COURSE  As part of my medical decision making, I reviewed the following data within the [place] notes reviewed and incorporated, Old chart reviewed, Notes from prior ED visits, and [place] Controlled Substance Database       *Talyn Dessert was evaluated in Emergency Department on 05/24/2020 for the symptoms described in the history of present illness. He was evaluated in the context of the global COVID-19 pandemic, which necessitated consideration that the patient might be at risk for infection with the SARS-CoV-2 virus that causes COVID-19. Institutional protocols and algorithms that pertain to the evaluation of patients at risk for COVID-19 are in a state of rapid change based on information released by regulatory bodies including the CDC and federal and state organizations. These policies and algorithms were followed during the patient's care in the ED.  Some ED evaluations and interventions may be delayed as a result of limited staffing during the pandemic.*     Medical Decision Making:  69 yo M here with SOB, leg swelling. Suspect CHF exacerbation, likely with falsely decreased BNP 2/2 his obesity. CXR shows interstitial edema and he has 2+ pitting, marked dyspnea with any movement, and borderline hypoxia. D-Dimer elevated but [place] neg. Unfortunately, timing suboptimal on CT but given normal [place], doubt DVT/PE clinically. Admit to medicine. IV lasix given.  ____________________________________________  FINAL CLINICAL IMPRESSION(S) / ED DIAGNOSES  Final diagnoses:  Acute congestive heart failure, unspecified heart failure type ([place])     MEDICATIONS GIVEN DURING THIS VISIT:  Medications  sodium chloride flush (NS) 0.9 % injection 3 mL (has no administration in time range)  sodium chloride flush (NS) 0.9 % injection 3 mL (has no administration in time range)  0.9 %  sodium chloride infusion (has no administration in time  range)  acetaminophen (TYLENOL) tablet 650 mg (has no administration in time range)  ondansetron (ZOFRAN) injection 4 mg (has no administration in time range)  enoxaparin (LOVENOX) injection 40 mg (has no administration in time range)  furosemide (LASIX) injection 40 mg (has no administration in time range)  potassium chloride SA (KLOR-CON) CR tablet 20 mEq (has no administration in time range)  lisinopril (ZESTRIL) tablet 10 mg (has no administration in time range)  metoprolol succinate (TOPROL-XL) 24 hr tablet 50 mg (has no administration in time range)  aspirin chewable tablet 81 mg (has no administration in time range)  meloxicam (MOBIC) tablet 7.5 mg (has no administration in time range)  rosuvastatin (CRESTOR) tablet 20 mg (has no administration in time range)  nicotine (NICODERM CQ - dosed in mg/24 hours) patch 14 mg (has no administration in time range)  famotidine (PEPCID) tablet 20 mg (has no administration in time range)  clopidogrel (PLAVIX) tablet 75 mg (has no administration in time range)  albuterol (VENTOLIN HFA) 108 (90 Base) MCG/ACT inhaler 2 puff (has no administration in time range)  triamcinolone cream (KENALOG) 0.5 % 1 application (has no administration in time range)  furosemide (LASIX) injection 40 mg (40 mg Intravenous Given 05/24/20 1456)  iohexol (OMNIPAQUE) 350 MG/ML injection 75 mL (75 mLs Intravenous Contrast Given 05/24/20 1544)     ED Discharge Orders         Ordered    Amb Referral to HF Clinic     Discontinue  Reprint     05/24/20 1725           Note:  This document was prepared using Dragon voice recognition software and may include unintentional dictation errors.   Shaune Pollack, MD 05/24/20 Yvonna Alanis    Shaune Pollack, MD 05/24/20 1754

## 2020-05-24 NOTE — ED Triage Notes (Signed)
Pt reports that for the past month has noticed increased swelling to legs, testicles and abdomen. Pt reports that he had covid last month.

## 2020-05-24 NOTE — ED Notes (Signed)
Incontinent large amount of urine.  Linen changed.  Skin care given.  External urine pouch reapplied.  Patient tolerated well, DOE noted when standing patient up to side of bed.

## 2020-05-25 ENCOUNTER — Inpatient Hospital Stay
Admit: 2020-05-25 | Discharge: 2020-05-25 | Disposition: A | Discharge status: Home / Self Care | Payer: Medicare Other | Attending: Internal Medicine | Admitting: Internal Medicine

## 2020-05-25 ENCOUNTER — Telehealth: Payer: Self-pay

## 2020-05-25 LAB — BASIC METABOLIC PANEL
Anion gap: 11 (ref 5–15)
BUN: 20 mg/dL (ref 8–23)
CO2: 29 mmol/L (ref 22–32)
Calcium: 8.9 mg/dL (ref 8.9–10.3)
Chloride: 96 mmol/L — ABNORMAL LOW (ref 98–111)
Creatinine, Ser: 1.25 mg/dL — ABNORMAL HIGH (ref 0.61–1.24)
GFR calc Af Amer: >60 mL/min (ref >60)
GFR calc non Af Amer: 59 mL/min — ABNORMAL LOW (ref >60)
Glucose, Bld: 106 mg/dL — ABNORMAL HIGH (ref 70–99)
Potassium: 4.5 mmol/L (ref 3.5–5.1)
Sodium: 136 mmol/L (ref 135–145)

## 2020-05-25 LAB — ECHOCARDIOGRAM COMPLETE
Height: 73 in
Weight: 6666.71 oz

## 2020-05-25 LAB — HIV ANTIBODY (ROUTINE TESTING W REFLEX): HIV Screen 4th Generation wRfx: NONREACTIVE

## 2020-05-25 NOTE — Telephone Encounter (Signed)
Called lmom informing patient need to schedule missed appointment for 3 month follow up. klh

## 2020-05-25 NOTE — Progress Notes (Signed)
PROGRESS NOTE    Coen Miyasato Barcellos  UXL:[id number] DOB: 27-Aug-1951 DOA: 05/24/2020 PCP: [facility]    Assessment & Plan:   Active Problems:   CAD in native artery   Morbid obesity with BMI of 50.0-59.9, adult (HCC)   CHF (congestive heart failure) (HCC)   Acute congestive heart failure:  with anasarca. Unknown systolic vs diastolic vs mixed. Echo pending. Continue on IV lasix. Strict I/Os. Continue on metoprolol and lisinopril.   Discontinue hydrochlorothiazide.  Morbid obesity: BMI of 54.97.  Significant weight loss needed  Hyperglycemia: HbA1c pending   Hx of CAD: continue on aspirin, plavix, lisinopril, metoprolol and statin    Elevated D-dimer:  ultrasound lower extremity negative.  CAT scan of the chest nondiagnostic.  AKI: baseline Cr is unknown. Cr is trending down today. Will continue to monitor   Hyperlipidemia: continue on statin   COPD: w/o exacerbation. Continue home inhalers.  Smoker: nicotine patch to prevent w/drawal. Smoking cessation counseling     DVT prophylaxis: lovenox Code Status: full  Family Communication:  Disposition Plan: depends on PT/OT recs   Consultants:   Cardio    Procedures:    Antimicrobials:   Subjective: Pt c/o shortness of breath   Objective: Vitals:   05/24/20 1930 05/24/20 1940 05/25/20 0541 05/25/20 0612  BP:  (!) 125/54  115/83  Pulse:      Resp:  18 18 18   Temp: 98.3 F (36.8 C)   98.6 F (37 C)  TempSrc: Oral   Oral  SpO2:      Weight:      Height:        Intake/Output Summary (Last 24 hours) at 05/25/2020 0838 Last data filed at 05/24/2020 2000 Gross per 24 hour  Intake 120 ml  Output 200 ml  Net -80 ml   Filed Weights   05/24/20 0943  Weight: (!) 189 kg    Examination:  General exam: Appears calm and comfortable  Respiratory system: diminished breath sounds b/l  Cardiovascular system: S1 & S2 +. No rubs, gallops or clicks.  Gastrointestinal system: Abdomen is  obese, soft and nontender.  Hypoactive bowel sounds heard. Central nervous system: Alert and oriented. Moves all 4 extremities  Psychiatry: Judgement and insight appear normal. Flat mood and affect     Data Reviewed: I have personally reviewed following labs and imaging studies  CBC: Recent Labs  Lab 05/24/20 0945  WBC 10.1  HGB 15.5  HCT 46.9  MCV 94.9  PLT 253   Basic Metabolic Panel: Recent Labs  Lab 05/24/20 0945 05/25/20 0515  NA 137 136  K 4.1 4.5  CL 95* 96*  CO2 29 29  GLUCOSE 134* 106*  BUN 19 20  CREATININE 1.46* 1.25*  CALCIUM 9.2 8.9   GFR: Estimated Creatinine Clearance: 100.2 mL/min (A) (by C-G formula based on SCr of 1.25 mg/dL (H)). Liver Function Tests: Recent Labs  Lab 05/24/20 0945  AST 18  ALT 14  ALKPHOS 68  BILITOT 0.9  PROT 7.1  ALBUMIN 3.8   No results for input(s): LIPASE, AMYLASE in the last 168 hours. No results for input(s): AMMONIA in the last 168 hours. Coagulation Profile: No results for input(s): INR, PROTIME in the last 168 hours. Cardiac Enzymes: No results for input(s): CKTOTAL, CKMB, CKMBINDEX, TROPONINI in the last 168 hours. BNP (last 3 results) No results for input(s): PROBNP in the last 8760 hours. HbA1C: No results for input(s): HGBA1C in the last 72 hours. CBG: No results for  input(s): GLUCAP in the last 168 hours. Lipid Profile: No results for input(s): CHOL, HDL, LDLCALC, TRIG, CHOLHDL, LDLDIRECT in the last 72 hours. Thyroid Function Tests: No results for input(s): TSH, T4TOTAL, FREET4, T3FREE, THYROIDAB in the last 72 hours. Anemia Panel: No results for input(s): VITAMINB12, FOLATE, FERRITIN, TIBC, IRON, RETICCTPCT in the last 72 hours. Sepsis Labs: No results for input(s): PROCALCITON, LATICACIDVEN in the last 168 hours.  Recent Results (from the past 240 hour(s))  SARS Coronavirus 2 by RT PCR (hospital order, performed in [facility] hospital lab) Nasopharyngeal Nasopharyngeal Swab     Status: None    Collection Time: 05/24/20  6:05 PM   Specimen: Nasopharyngeal Swab  Result Value Ref Range Status   SARS Coronavirus 2 NEGATIVE NEGATIVE Final    Comment: (NOTE) SARS-CoV-2 target nucleic acids are NOT DETECTED.  The SARS-CoV-2 RNA is generally detectable in upper and lower respiratory specimens during the acute phase of infection. The lowest concentration of SARS-CoV-2 viral copies this assay can detect is 250 copies / mL. A negative result does not preclude SARS-CoV-2 infection and should not be used as the sole basis for treatment or other patient management decisions.  A negative result may occur with improper specimen collection / handling, submission of specimen other than nasopharyngeal swab, presence of viral mutation(s) within the areas targeted by this assay, and inadequate number of viral copies (<250 copies / mL). A negative result must be combined with clinical observations, patient history, and epidemiological information.  Fact Sheet for Patients:   BoilerBrush.com.cy  Fact Sheet for Healthcare Providers: https://pope.com/  This test is not yet approved or  cleared by the [place] FDA and has been authorized for detection and/or diagnosis of SARS-CoV-2 by FDA under an Emergency Use Authorization (EUA).  This EUA will remain in effect (meaning this test can be used) for the duration of the COVID-19 declaration under Section 564(b)(1) of the Act, 21 U.S.C. section 360bbb-3(b)(1), unless the authorization is terminated or revoked sooner.  Performed at [facility], [address], [place], [place] [address]          Radiology Studies: DG Chest 2 View  Result Date: 05/24/2020 CLINICAL DATA:  Shortness of breath. EXAM: CHEST - 2 VIEW COMPARISON:  12/08/2018. FINDINGS: Mild cardiomegaly with bilateral pulmonary interstitial prominence consistent with CHF. Pneumonitis cannot be excluded. No pleural  effusion or pneumothorax. Degenerative change thoracic spine. IMPRESSION: Mild cardiomegaly. Bilateral interstitial prominence suggesting CHF. Pneumonitis cannot be excluded. Electronically Signed   By: Maisie Fus  Register   On: 05/24/2020 10:20   CT Angio Chest PE W and/or Wo Contrast  Result Date: 05/24/2020 CLINICAL DATA:  Increasing lower extremity edema for 1 month, recent COVID-19, elevated D-dimer, decreased renal function EXAM: CT ANGIOGRAPHY CHEST WITH CONTRAST TECHNIQUE: Multidetector CT imaging of the chest was performed using the standard protocol during bolus administration of intravenous contrast. Multiplanar CT image reconstructions and MIPs were obtained to evaluate the vascular anatomy. CONTRAST:  57mL OMNIPAQUE IOHEXOL 350 MG/ML SOLN COMPARISON:  05/24/2020 FINDINGS: Cardiovascular: This is a technically nondiagnostic evaluation of the pulmonary vasculature due to timing of the imaging after contrast bolus. There is insufficient enhancement of the pulmonary arterial system, and pulmonary emboli cannot be excluded. The thoracic aorta demonstrates normal enhancement with no aneurysm or dissection. Moderate atherosclerosis of the aorta and coronary vasculature. The heart is not enlarged.  No pericardial effusion. Mediastinum/Nodes: No enlarged mediastinal, hilar, or axillary lymph nodes. Thyroid gland, trachea, and esophagus demonstrate no significant findings.  Lungs/Pleura: No acute airspace disease, effusion, or pneumothorax. Central airways are widely patent. Upper Abdomen: No acute abnormality. 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 No acute or destructive bony lesions. Reconstructed images demonstrate no additional findings. Review of the MIP images confirms the above findings. IMPRESSION: 1. Nondiagnostic evaluation of the pulmonary vasculature due to poor timing of the contrast bolus. No additional contrast was administered due to patient's  underlying renal dysfunction. Pulmonary emboli cannot be excluded, and if pulmonary emboli remain a clinical concern nuclear medicine ventilation/perfusion scan could be considered. 2. No acute intrathoracic process. 3. Aortic Atherosclerosis (ICD10-I70.0). Coronary artery atherosclerosis. Electronically Signed   By: Randa Ngo M.D.   On: 05/24/2020 16:09   US Venous Img Lower Bilateral  Result Date: 05/24/2020 CLINICAL DATA:  Bilateral lower extremity swelling 3 weeks EXAM: BILATERAL LOWER EXTREMITY VENOUS DOPPLER ULTRASOUND TECHNIQUE: Gray-scale sonography with graded compression, as well as color Doppler and duplex ultrasound were performed to evaluate the lower extremity deep venous systems from the level of the common femoral vein and including the common femoral, femoral, profunda femoral, popliteal and calf veins including the posterior tibial, peroneal and gastrocnemius veins when visible. The superficial great saphenous vein was also interrogated. Spectral Doppler was utilized to evaluate flow at rest and with distal augmentation maneuvers in the common femoral, femoral and popliteal veins. COMPARISON:  11/09/2011 FINDINGS: RIGHT LOWER EXTREMITY Common Femoral Vein: No evidence of thrombus. Normal compressibility, respiratory phasicity and response to augmentation. Saphenofemoral Junction: No evidence of thrombus. Normal compressibility and flow on color Doppler imaging. Profunda Femoral Vein: No evidence of thrombus. Normal compressibility and flow on color Doppler imaging. Femoral Vein: No evidence of thrombus. Normal compressibility, respiratory phasicity and response to augmentation. Popliteal Vein: No evidence of thrombus. Normal compressibility, respiratory phasicity and response to augmentation. Calf Veins: No evidence of thrombus. Normal compressibility and flow on color Doppler imaging. Superficial Great Saphenous Vein: No evidence of thrombus. Normal compressibility. Venous Reflux:  None.  Other Findings:  None. LEFT LOWER EXTREMITY Common Femoral Vein: No evidence of thrombus. Normal compressibility, respiratory phasicity and response to augmentation. Saphenofemoral Junction: No evidence of thrombus. Normal compressibility and flow on color Doppler imaging. Profunda Femoral Vein: No evidence of thrombus. Normal compressibility and flow on color Doppler imaging. Femoral Vein: No evidence of thrombus. Normal compressibility, respiratory phasicity and response to augmentation. Popliteal Vein: No evidence of thrombus. Normal compressibility, respiratory phasicity and response to augmentation. Calf Veins: No evidence of thrombus. Normal compressibility and flow on color Doppler imaging. Superficial Great Saphenous Vein: No evidence of thrombus. Normal compressibility. Venous Reflux:  None. Other Findings:  None. IMPRESSION: No evidence of deep venous thrombosis in either lower extremity. Electronically Signed   By: Davina Poke D.O.   On: 05/24/2020 16:42        Scheduled Meds: . aspirin  81 mg Oral Daily  . clopidogrel  75 mg Oral Daily  . enoxaparin (LOVENOX) injection  40 mg Subcutaneous Q12H  . famotidine  20 mg Oral BID  . furosemide  40 mg Intravenous BID  . lisinopril  10 mg Oral Daily  . metoprolol succinate  50 mg Oral Daily  . nicotine  14 mg Transdermal Daily  . potassium chloride  20 mEq Oral BID  . rosuvastatin  20 mg Oral q1800  . sodium chloride flush  3 mL Intravenous Q12H  . triamcinolone cream  1 application Topical BID   Continuous Infusions: . sodium chloride  LOS: 1 day    Time spent: 32 mins     Charise Killian, MD [facility] Pager [phone]  If 7PM-7AM, please contact night-coverage [email or web address] 05/25/2020, 8:38 AM

## 2020-05-25 NOTE — Progress Notes (Signed)
*  PRELIMINARY RESULTS* Echocardiogram 2D Echocardiogram has been performed.  Cristela Blue 05/25/2020, 10:27 AM

## 2020-05-25 NOTE — Plan of Care (Signed)
Oriented to unit & plan of care for heart failure.

## 2020-05-26 LAB — CBC
HCT: 41.5 % (ref 39.0–52.0)
Hemoglobin: 13.8 g/dL (ref 13.0–17.0)
MCH: 32 pg (ref 26.0–34.0)
MCHC: 33.3 g/dL (ref 30.0–36.0)
MCV: 96.3 fL (ref 80.0–100.0)
Platelets: 240 10*3/uL (ref 150–400)
RBC: 4.31 MIL/uL (ref 4.22–5.81)
RDW: 13.5 % (ref 11.5–15.5)
WBC: 7.3 10*3/uL (ref 4.0–10.5)
nRBC: 0 % (ref 0.0–0.2)

## 2020-05-26 LAB — BASIC METABOLIC PANEL
Anion gap: 11 (ref 5–15)
BUN: 26 mg/dL — ABNORMAL HIGH (ref 8–23)
CO2: 31 mmol/L (ref 22–32)
Calcium: 8.5 mg/dL — ABNORMAL LOW (ref 8.9–10.3)
Chloride: 95 mmol/L — ABNORMAL LOW (ref 98–111)
Creatinine, Ser: 1.49 mg/dL — ABNORMAL HIGH (ref 0.61–1.24)
GFR calc Af Amer: 55 mL/min — ABNORMAL LOW (ref >60)
GFR calc non Af Amer: 48 mL/min — ABNORMAL LOW (ref >60)
Glucose, Bld: 116 mg/dL — ABNORMAL HIGH (ref 70–99)
Potassium: 4.3 mmol/L (ref 3.5–5.1)
Sodium: 137 mmol/L (ref 135–145)

## 2020-05-26 MED ORDER — FUROSEMIDE 10 MG/ML IJ SOLN
40.0000 mg | Freq: Every day | INTRAMUSCULAR | Status: DC
  Administered 2020-05-26 – 2020-05-28 (×3): 40 mg via INTRAVENOUS
  Filled 2020-05-26 (×3): qty 4

## 2020-05-26 NOTE — Progress Notes (Addendum)
PROGRESS NOTE    Thomas Tanner  QIO:[id number] DOB: 02/18/1951 DOA: 05/24/2020 PCP: Jenne PaneLlc, [facility]    Assessment & Plan:   Active Problems:   CAD in native artery   Morbid obesity with BMI of 50.0-59.9, adult (HCC)   CHF (congestive heart failure) (HCC)   Acute on chronic systolic congestive heart failure: with anasarca. Echo shows EF 50-55%, unknown diastolic dysfunction. Continue on IV lasix. Strict I/Os. Continue on metoprolol and lisinopril. Neg fluid balance approx 2L.   Discontinue hydrochlorothiazide.  Morbid obesity: BMI of 54.97.  Significant weight loss needed  Hyperglycemia: HbA1c pending   Hx of CAD: continue on aspirin, plavix, lisinopril, metoprolol and statin    Elevated D-dimer:  ultrasound lower extremity negative.  CAT scan of the chest nondiagnostic.  AKI: baseline Cr is unknown. Cr is labile. Will continue to monitor   Hyperlipidemia: continue on statin   COPD: w/o exacerbation. Continue home inhalers.  Smoker: nicotine patch to prevent w/drawal. Smoking cessation counseling     DVT prophylaxis: lovenox Code Status: full  Family Communication:  Disposition Plan: depends on PT/OT recs   Status is: Inpatient  Remains inpatient appropriate because:IV treatments appropriate due to intensity of illness or inability to take PO   Dispo: The patient is from: Home              Anticipated d/c is to: SNF vs home health               Anticipated d/c date is: 2 days              Patient currently is not medically stable to d/c.     Consultants:   Cardio    Procedures:    Antimicrobials:   Subjective: Pt c/o shortness of breath still  Objective: Vitals:   05/25/20 1942 05/25/20 2028 05/26/20 0431 05/26/20 0731  BP: 133/81 (!) 112/57 (!) 103/52 (!) 109/54  Pulse: 92 82 85 81  Resp: 18 (!) 24 20 19   Temp: 98 F (36.7 C) 98.1 F (36.7 C) 97.9 F (36.6 C) 98.1 F (36.7 C)  TempSrc: Oral Oral Oral Oral  SpO2:  100% 95% 94% 93%  Weight:   (!) 207.7 kg   Height:        Intake/Output Summary (Last 24 hours) at 05/26/2020 0754 Last data filed at 05/26/2020 0656 Gross per 24 hour  Intake 1080 ml  Output 3125 ml  Net -2045 ml   Filed Weights   05/24/20 0943 05/25/20 1830 05/26/20 0431  Weight: (!) 189 kg (!) 205.7 kg (!) 207.7 kg    Examination:  General exam: Appears calm and comfortable  Respiratory system: decreased breath sounds b/l. No wheezes Cardiovascular system: S1 & S2 +. No rubs, gallops or clicks.  Gastrointestinal system: Abdomen is obese, soft and nontender.  Hypoactive bowel sounds heard. Central nervous system: Alert and oriented. Moves all 4 extremities  Psychiatry: Judgement and insight appear normal. Normal mood and affect     Data Reviewed: I have personally reviewed following labs and imaging studies  CBC: Recent Labs  Lab 05/24/20 0945 05/26/20 0505  WBC 10.1 7.3  HGB 15.5 13.8  HCT 46.9 41.5  MCV 94.9 96.3  PLT 253 240   Basic Metabolic Panel: Recent Labs  Lab 05/24/20 0945 05/25/20 0515 05/26/20 0505  NA 137 136 137  K 4.1 4.5 4.3  CL 95* 96* 95*  CO2 29 29 31   GLUCOSE 134* 106* 116*  BUN 19 20  26*  CREATININE 1.46* 1.25* 1.49*  CALCIUM 9.2 8.9 8.5*   GFR: Estimated Creatinine Clearance: 89.1 mL/min (A) (by C-G formula based on SCr of 1.49 mg/dL (H)). Liver Function Tests: Recent Labs  Lab 05/24/20 0945  AST 18  ALT 14  ALKPHOS 68  BILITOT 0.9  PROT 7.1  ALBUMIN 3.8   No results for input(s): LIPASE, AMYLASE in the last 168 hours. No results for input(s): AMMONIA in the last 168 hours. Coagulation Profile: No results for input(s): INR, PROTIME in the last 168 hours. Cardiac Enzymes: No results for input(s): CKTOTAL, CKMB, CKMBINDEX, TROPONINI in the last 168 hours. BNP (last 3 results) No results for input(s): PROBNP in the last 8760 hours. HbA1C: No results for input(s): HGBA1C in the last 72 hours. CBG: No results for  input(s): GLUCAP in the last 168 hours. Lipid Profile: No results for input(s): CHOL, HDL, LDLCALC, TRIG, CHOLHDL, LDLDIRECT in the last 72 hours. Thyroid Function Tests: No results for input(s): TSH, T4TOTAL, FREET4, T3FREE, THYROIDAB in the last 72 hours. Anemia Panel: No results for input(s): VITAMINB12, FOLATE, FERRITIN, TIBC, IRON, RETICCTPCT in the last 72 hours. Sepsis Labs: No results for input(s): PROCALCITON, LATICACIDVEN in the last 168 hours.  Recent Results (from the past 240 hour(s))  SARS Coronavirus 2 by RT PCR (hospital order, performed in [facility] hospital lab) Nasopharyngeal Nasopharyngeal Swab     Status: None   Collection Time: 05/24/20  6:05 PM   Specimen: Nasopharyngeal Swab  Result Value Ref Range Status   SARS Coronavirus 2 NEGATIVE NEGATIVE Final    Comment: (NOTE) SARS-CoV-2 target nucleic acids are NOT DETECTED.  The SARS-CoV-2 RNA is generally detectable in upper and lower respiratory specimens during the acute phase of infection. The lowest concentration of SARS-CoV-2 viral copies this assay can detect is 250 copies / mL. A negative result does not preclude SARS-CoV-2 infection and should not be used as the sole basis for treatment or other patient management decisions.  A negative result may occur with improper specimen collection / handling, submission of specimen other than nasopharyngeal swab, presence of viral mutation(s) within the areas targeted by this assay, and inadequate number of viral copies (<250 copies / mL). A negative result must be combined with clinical observations, patient history, and epidemiological information.  Fact Sheet for Patients:   BoilerBrush.com.cy  Fact Sheet for Healthcare Providers: https://pope.com/  This test is not yet approved or  cleared by the [place] FDA and has been authorized for detection and/or diagnosis of SARS-CoV-2 by FDA under an Emergency Use  Authorization (EUA).  This EUA will remain in effect (meaning this test can be used) for the duration of the COVID-19 declaration under Section 564(b)(1) of the Act, 21 U.S.C. section 360bbb-3(b)(1), unless the authorization is terminated or revoked sooner.  Performed at [facility], [address], [place], [place] [address]          Radiology Studies: DG Chest 2 View  Result Date: 05/24/2020 CLINICAL DATA:  Shortness of breath. EXAM: CHEST - 2 VIEW COMPARISON:  12/08/2018. FINDINGS: Mild cardiomegaly with bilateral pulmonary interstitial prominence consistent with CHF. Pneumonitis cannot be excluded. No pleural effusion or pneumothorax. Degenerative change thoracic spine. IMPRESSION: Mild cardiomegaly. Bilateral interstitial prominence suggesting CHF. Pneumonitis cannot be excluded. Electronically Signed   By: Maisie Fus  Register   On: 05/24/2020 10:20   CT Angio Chest PE W and/or Wo Contrast  Result Date: 05/24/2020 CLINICAL DATA:  Increasing lower extremity edema for 1 month, recent  COVID-19, elevated D-dimer, decreased renal function EXAM: CT ANGIOGRAPHY CHEST WITH CONTRAST TECHNIQUE: Multidetector CT imaging of the chest was performed using the standard protocol during bolus administration of intravenous contrast. Multiplanar CT image reconstructions and MIPs were obtained to evaluate the vascular anatomy. CONTRAST:  44mL OMNIPAQUE IOHEXOL 350 MG/ML SOLN COMPARISON:  05/24/2020 FINDINGS: Cardiovascular: This is a technically nondiagnostic evaluation of the pulmonary vasculature due to timing of the imaging after contrast bolus. There is insufficient enhancement of the pulmonary arterial system, and pulmonary emboli cannot be excluded. The thoracic aorta demonstrates normal enhancement with no aneurysm or dissection. Moderate atherosclerosis of the aorta and coronary vasculature. The heart is not enlarged.  No pericardial effusion. Mediastinum/Nodes: No enlarged mediastinal, hilar,  or axillary lymph nodes. Thyroid gland, trachea, and esophagus demonstrate no significant findings. Lungs/Pleura: No acute airspace disease, effusion, or pneumothorax. Central airways are widely patent. Upper Abdomen: No acute abnormality. 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 No acute or destructive bony lesions. Reconstructed images demonstrate no additional findings. Review of the MIP images confirms the above findings. IMPRESSION: 1. Nondiagnostic evaluation of the pulmonary vasculature due to poor timing of the contrast bolus. No additional contrast was administered due to patient's underlying renal dysfunction. Pulmonary emboli cannot be excluded, and if pulmonary emboli remain a clinical concern nuclear medicine ventilation/perfusion scan could be considered. 2. No acute intrathoracic process. 3. Aortic Atherosclerosis (ICD10-I70.0). Coronary artery atherosclerosis. Electronically Signed   By: Sharlet Salina M.D.   On: 05/24/2020 16:09   US Venous Img Lower Bilateral  Result Date: 05/24/2020 CLINICAL DATA:  Bilateral lower extremity swelling 3 weeks EXAM: BILATERAL LOWER EXTREMITY VENOUS DOPPLER ULTRASOUND TECHNIQUE: Gray-scale sonography with graded compression, as well as color Doppler and duplex ultrasound were performed to evaluate the lower extremity deep venous systems from the level of the common femoral vein and including the common femoral, femoral, profunda femoral, popliteal and calf veins including the posterior tibial, peroneal and gastrocnemius veins when visible. The superficial great saphenous vein was also interrogated. Spectral Doppler was utilized to evaluate flow at rest and with distal augmentation maneuvers in the common femoral, femoral and popliteal veins. COMPARISON:  11/09/2011 FINDINGS: RIGHT LOWER EXTREMITY Common Femoral Vein: No evidence of thrombus. Normal compressibility, respiratory phasicity and response to  augmentation. Saphenofemoral Junction: No evidence of thrombus. Normal compressibility and flow on color Doppler imaging. Profunda Femoral Vein: No evidence of thrombus. Normal compressibility and flow on color Doppler imaging. Femoral Vein: No evidence of thrombus. Normal compressibility, respiratory phasicity and response to augmentation. Popliteal Vein: No evidence of thrombus. Normal compressibility, respiratory phasicity and response to augmentation. Calf Veins: No evidence of thrombus. Normal compressibility and flow on color Doppler imaging. Superficial Great Saphenous Vein: No evidence of thrombus. Normal compressibility. Venous Reflux:  None. Other Findings:  None. LEFT LOWER EXTREMITY Common Femoral Vein: No evidence of thrombus. Normal compressibility, respiratory phasicity and response to augmentation. Saphenofemoral Junction: No evidence of thrombus. Normal compressibility and flow on color Doppler imaging. Profunda Femoral Vein: No evidence of thrombus. Normal compressibility and flow on color Doppler imaging. Femoral Vein: No evidence of thrombus. Normal compressibility, respiratory phasicity and response to augmentation. Popliteal Vein: No evidence of thrombus. Normal compressibility, respiratory phasicity and response to augmentation. Calf Veins: No evidence of thrombus. Normal compressibility and flow on color Doppler imaging. Superficial Great Saphenous Vein: No evidence of thrombus. Normal compressibility. Venous Reflux:  None. Other Findings:  None. IMPRESSION: No evidence of deep venous thrombosis in either  lower extremity. Electronically Signed   By: Duanne Guess D.O.   On: 05/24/2020 16:42   ECHOCARDIOGRAM COMPLETE  Result Date: 05/25/2020    ECHOCARDIOGRAM REPORT   Patient Name:   CODEY BURLING Date of Exam: 05/25/2020 Medical Rec #:  [id number]                Height:       73.0 in Accession #:    [id number]               Weight:       416.7 lb Date of Birth:  09-25-1951                  BSA:          2.939 m Patient Age:    69 years                 BP:           115/83 mmHg Patient Gender: M                        HR:           Not listed in chart bpm. Exam Location:  [facility] Procedure: 2D Echo, Cardiac Doppler and Color Doppler Indications:     CHF- acute diastolic 428.31  History:         Patient has prior history of Echocardiogram examinations, most                  recent 12/09/2018. COPD and TIA; Risk Factors:Hypertension and                  Diabetes.  Sonographer:     Cristela Blue RDCS (AE) Referring Phys:  [id number] Alford Highland Diagnosing Phys: Harold Hedge MD  Sonographer Comments: Technically difficult study due to poor echo windows, no apical window and no subcostal window. Image acquisition challenging due to COPD. IMPRESSIONS  1. Left ventricular ejection fraction, by estimation, is 50 to 55%. Left ventricular ejection fraction by PLAX is 53 %. The left ventricle has low normal function. The left ventricle has no regional wall motion abnormalities. There is mild left ventricular hypertrophy. Left ventricular diastolic function could not be evaluated.  2. Right ventricular systolic function was not well visualized. The right ventricular size is not well visualized.  3. The mitral valve was not well visualized. No evidence of mitral valve regurgitation.  4. The aortic valve was not well visualized. Aortic valve regurgitation is not visualized. FINDINGS  Left Ventricle: Left ventricular ejection fraction, by estimation, is 50 to 55%. Left ventricular ejection fraction by PLAX is 53 %. The left ventricle has low normal function. The left ventricle has no regional wall motion abnormalities. The left ventricular internal cavity size was normal in size. There is mild left ventricular hypertrophy. Left ventricular diastolic function could not be evaluated. Right Ventricle: The right ventricular size is not well visualized. Right vetricular wall thickness was not assessed. Right  ventricular systolic function was not well visualized. Left Atrium: Left atrial size was not well visualized. Right Atrium: Right atrial size was not well visualized. Pericardium: There is no evidence of pericardial effusion. Mitral Valve: The mitral valve was not well visualized. No evidence of mitral valve regurgitation. Tricuspid Valve: The tricuspid valve is not well visualized. Tricuspid valve regurgitation is not demonstrated. Aortic Valve: The aortic valve was not well visualized. Aortic valve regurgitation is not visualized. Pulmonic  Valve: The pulmonic valve was not well visualized. Pulmonic valve regurgitation is not visualized. Aorta: The aortic root was not well visualized. IAS/Shunts: The interatrial septum was not assessed.  LEFT VENTRICLE PLAX 2D LV EF:         Left ventricular ejection fraction by PLAX is 53 %. LVIDd:         3.36 cm LVIDs:         2.48 cm LV PW:         1.25 cm LV IVS:        0.89 cm LVOT diam:     2.00 cm LVOT Area:     3.14 cm  LEFT ATRIUM         Index LA diam:    2.90 cm 0.99 cm/m   AORTA Ao Root diam: 1.70 cm  SHUNTS Systemic Diam: 2.00 cm Bartholome Bill MD Electronically signed by Bartholome Bill MD Signature Date/Time: 05/25/2020/12:14:47 PM    Final         Scheduled Meds: . aspirin  81 mg Oral Daily  . clopidogrel  75 mg Oral Daily  . enoxaparin (LOVENOX) injection  40 mg Subcutaneous Q12H  . famotidine  20 mg Oral BID  . furosemide  40 mg Intravenous BID  . lisinopril  10 mg Oral Daily  . metoprolol succinate  50 mg Oral Daily  . nicotine  14 mg Transdermal Daily  . potassium chloride  20 mEq Oral BID  . rosuvastatin  20 mg Oral q1800  . sodium chloride flush  3 mL Intravenous Q12H  . triamcinolone cream  1 application Topical BID   Continuous Infusions: . sodium chloride       LOS: 2 days    Time spent: 30 mins     Wyvonnia Dusky, MD [facility] Pager [phone]  If 7PM-7AM, please contact  night-coverage [email or web address] 05/26/2020, 7:54 AM

## 2020-05-26 NOTE — Evaluation (Signed)
Physical Therapy Evaluation Patient Details Name: Thomas Tanner MRN: [id number] DOB: 1951/08/08 Today's Date: 05/26/2020   History of Present Illness  Per MD notes: Pt is a 69 y.o. male with PMH that includes: COPD, DM, HTN, TIA, tachycardia, and CAD as well as COVID around 2 months ago per patient coming in with leg swelling, weight gain, stomach and scrotum swelling and also shortness of breath.  MD assessment includes: Acute on chronic systolic congestive heart failure, Hyperglycemia, elevated D-dimer with negative LE ultrasound, and AKI.    Clinical Impression  Pt pleasant and motivated to participate during the session but ultimately was very limited by deficits in activity tolerance.  Pt required physical assistance with bed mobility tasks along with cues for safe sequencing with bed mobility and transfers.  Pt demonstrated very limited tolerance to standing in general as well as with amb and was only able to amb several steps form the bed to the recliner before needing to return to sitting.  Pt's SpO2 >/= 90% throughout with HR WNL.  Pt reported that he has not had to attempt to ascend his 6 steps into his home for months and would not be safe to return to his prior living situation at this time.  Pt will benefit from PT services in a SNF setting upon discharge to safely address deficits listed in patient problem list for decreased caregiver assistance and eventual return to PLOF.      Follow Up Recommendations SNF    Equipment Recommendations  None recommended by PT    Recommendations for Other Services       Precautions / Restrictions Precautions Precautions: Fall Restrictions Weight Bearing Restrictions: No Other Position/Activity Restrictions: Keep O2 >88%      Mobility  Bed Mobility Overal bed mobility: Needs Assistance Bed Mobility: Supine to Sit     Supine to sit: Min assist     General bed mobility comments: Min A with use of bed rail along with  signficant extra time and effort during sup to sit along with cues for hand placement and use of rails  Transfers Overall transfer level: Needs assistance Equipment used: Rolling walker (2 wheeled) Transfers: Sit to/from Stand Sit to Stand: Min guard         General transfer comment: Fair eccentric and concentric control with cues for proper sequencing  Ambulation/Gait Ambulation/Gait assistance: Min guard Gait Distance (Feet): 3 Feet Assistive device: Rolling walker (2 wheeled) Gait Pattern/deviations: Step-through pattern;Decreased step length - right;Decreased step length - left;Trunk flexed Gait velocity: decreased   General Gait Details: Pt able to take several slow, effortful steps from bed to chair before needing to return to sitting secondary to fatigue.  SpO2 >/= 90% throughout with HR WNL.  Stairs            Wheelchair Mobility    Modified Rankin (Stroke Patients Only)       Balance Overall balance assessment: Needs assistance   Sitting balance-Leahy Scale: Good     Standing balance support: Bilateral upper extremity supported Standing balance-Leahy Scale: Good                               Pertinent Vitals/Pain Pain Assessment: No/denies pain    Home Living Family/patient expects to be discharged to:: Private residence Living Arrangements: Spouse/significant other;Children;Other relatives Available Help at Discharge: Family;Available 24 hours/day Type of Home: House Home Access: Stairs to enter Entrance Stairs-Rails: Can reach both;Left;Right Entrance  Stairs-Number of Steps: 6 steps Home Layout: One level Home Equipment: Walker - 4 wheels Additional Comments: "I haven't been in a normal state of health for 6 years" 2/2 OA R hip and R knee    Prior Function Level of Independence: Needs assistance   Gait / Transfers Assistance Needed: amb w/ 4WW for mobility, could ambulate from parking lot to store then use a scooter cart, no fall  history  ADL's / Homemaking Assistance Needed: Used to take baths and indep with all ADL/IADL, but since CV-19 in April/May, having more difficulty with showering. Needs assist for socks/shoes for long time.  Comments: No O2 at home     Hand Dominance        Extremity/Trunk Assessment   Upper Extremity Assessment Upper Extremity Assessment: Defer to OT evaluation    Lower Extremity Assessment Lower Extremity Assessment: Generalized weakness;RLE deficits/detail;LLE deficits/detail RLE Deficits / Details: Per pt h/o R hip and knee OA with RLE too painful to MMT RLE: Unable to fully assess due to pain LLE Deficits / Details: LLE strength WFL       Communication   Communication: HOH  Cognition Arousal/Alertness: Awake/alert Behavior During Therapy: WFL for tasks assessed/performed Overall Cognitive Status: Within Functional Limits for tasks assessed                                        General Comments      Exercises Other Exercises Other Exercises: Cuing for general sequencing during bed mobiltiy and transfer training   Assessment/Plan    PT Assessment Patient needs continued PT services  PT Problem List Decreased strength;Decreased activity tolerance;Decreased balance;Decreased mobility;Decreased knowledge of use of DME       PT Treatment Interventions DME instruction;Gait training;Stair training;Functional mobility training;Therapeutic activities;Therapeutic exercise;Balance training;Patient/family education    PT Goals (Current goals can be found in the Care Plan section)  Acute Rehab PT Goals Patient Stated Goal: To get stronger with better stamina PT Goal Formulation: With patient Time For Goal Achievement: 06/08/20 Potential to Achieve Goals: Fair    Frequency Min 2X/week   Barriers to discharge Inaccessible home environment      Co-evaluation PT/OT/SLP Co-Evaluation/Treatment: Yes Reason for Co-Treatment: To address functional/ADL  transfers;For patient/therapist safety PT goals addressed during session: Mobility/safety with mobility;Balance;Proper use of DME OT goals addressed during session: ADL's and self-care;Strengthening/ROM       AM-PAC PT "6 Clicks" Mobility  Outcome Measure Help needed turning from your back to your side while in a flat bed without using bedrails?: A Little Help needed moving from lying on your back to sitting on the side of a flat bed without using bedrails?: A Little Help needed moving to and from a bed to a chair (including a wheelchair)?: A Little Help needed standing up from a chair using your arms (e.g., wheelchair or bedside chair)?: A Little Help needed to walk in hospital room?: A Lot Help needed climbing 3-5 steps with a railing? : Total 6 Click Score: 15    End of Session Equipment Utilized During Treatment: Gait belt;Oxygen Activity Tolerance: Patient limited by fatigue Patient left: in chair;with call bell/phone within reach;Other (comment) (Pt left with OT) Nurse Communication: Mobility status PT Visit Diagnosis: Difficulty in walking, not elsewhere classified (R26.2);Muscle weakness (generalized) (M62.81)    Time: 5697-9480 PT Time Calculation (min) (ACUTE ONLY): 36 min   Charges:   PT Evaluation $PT  Eval Moderate Complexity: 1 Mod PT Treatments $Therapeutic Activity: 8-22 mins        D. Royetta Asal PT, DPT 05/26/20, 4:07 PM

## 2020-05-26 NOTE — Evaluation (Signed)
Occupational Therapy Evaluation Patient Details Name: Thomas Tanner MRN: [id number] DOB: 22-Sep-1951 Today's Date: 05/26/2020    History of Present Illness Per MD notes: Pt is a 69 y.o. male with PMH that includes: COPD, DM, HTN, TIA, tachycardia, and CAD as well as COVID around 2 months ago per patient coming in with leg swelling, weight gain, stomach and scrotum swelling and also shortness of breath.  MD assessment includes: Acute on chronic systolic congestive heart failure, Hyperglycemia, elevated D-dimer with negative LE ultrasound, and AKI.   Clinical Impression   Pt seen for OT evaluation and co-tx with PT this date. Per pt report, since recent Covid-19 illness last month, pt has had increased difficulty with activity tolerance while performing basic ADL tasks such as showering and meal prep. Pt currently requires Min A assist for LB ADL tasks due to current functional impairments (See OT Problem List below). Pt educated in energy conservation strategies including falls prevention, home/routines modifications, AE/DME, positioning, activity pacing, and work simplification to max safety/indep with ADL/IADL tasks. Pt verbalized understanding and would benefit from additional skilled OT to support recall and carryover. Pt would benefit from additional skilled OT services to maximize recall and carryover of learned techniques and facilitate implementation of learned techniques into daily routines. Upon discharge, recommend STR services.      Follow Up Recommendations  SNF    Equipment Recommendations  Other (comment) (bariatric TTB, LH sponge)    Recommendations for Other Services       Precautions / Restrictions Precautions Precautions: Fall Restrictions Weight Bearing Restrictions: No Other Position/Activity Restrictions: Keep O2 >88%      Mobility Bed Mobility Overal bed mobility: Needs Assistance Bed Mobility: Supine to Sit     Supine to sit: Min assist      General bed mobility comments: Min A with use of bed rail along with signficant extra time and effort during sup to sit  Transfers Overall transfer level: Needs assistance Equipment used: Rolling walker (2 wheeled) Transfers: Sit to/from Stand Sit to Stand: Min guard         General transfer comment: Fair eccentric and concentric control    Balance Overall balance assessment: Needs assistance   Sitting balance-Leahy Scale: Good     Standing balance support: Bilateral upper extremity supported Standing balance-Leahy Scale: Good                             ADL either performed or assessed with clinical judgement   ADL Overall ADL's : Needs assistance/impaired                                       General ADL Comments: Min A for LB ADL tasks, CGA for ADL transfers, SOB quickly, cues for PLB     Vision Baseline Vision/History: Wears glasses Wears Glasses: At all times Patient Visual Report: No change from baseline       Perception     Praxis      Pertinent Vitals/Pain Pain Assessment: No/denies pain     Hand Dominance     Extremity/Trunk Assessment Upper Extremity Assessment Upper Extremity Assessment: Overall WFL for tasks assessed (5/5 bilat)   Lower Extremity Assessment Lower Extremity Assessment: Defer to PT evaluation;RLE deficits/detail;LLE deficits/detail RLE Deficits / Details: Per pt h/o R hip and knee OA with RLE too painful to MMT RLE: Unable  to fully assess due to pain LLE Deficits / Details: LLE strength WFL   Cervical / Trunk Assessment Cervical / Trunk Assessment: Normal   Communication Communication Communication: HOH   Cognition Arousal/Alertness: Awake/alert Behavior During Therapy: WFL for tasks assessed/performed Overall Cognitive Status: Within Functional Limits for tasks assessed                                     General Comments       Exercises Other Exercises Other Exercises:  Cuing for general sequencing during bed mobiltiy and transfer training Other Exercises: Pt educated in energy conservation strategies including falls prevention, home/routines modifications, AE/DME, positioning, activity pacing, and work simplification to max safety/indep with ADL/IADL tasks. Pt verbalized understanding and would benefit from additional skilled OT to support recall and carryover   Shoulder Instructions      Home Living Family/patient expects to be discharged to:: Private residence Living Arrangements: Spouse/significant other;Children;Other relatives (wife, son, granddtr, and great grandson) Available Help at Discharge: Family;Available 24 hours/day Type of Home: House Home Access: Stairs to enter [employer] of Steps: 6 steps Entrance Stairs-Rails: Can reach both;Left;Right Home Layout: One level     Bathroom Shower/Tub: Chief Strategy Officer: Standard     Home Equipment: Environmental consultant - 4 wheels;Adaptive equipment Adaptive Equipment: Reacher Additional Comments: "I haven't been in a normal state of health for 6 years" 2/2 OA R hip and R knee      Prior Functioning/Environment Level of Independence: Needs assistance  Gait / Transfers Assistance Needed: amb w/ 4WW for mobility, could ambulate from parking lot to store then use a scooter cart ADL's / Homemaking Assistance Needed: Used to take baths and indep with all ADL/IADL, but since CV-19 in April/May, having more difficulty with showering. Needs assist for socks/shoes for long time.   Comments: No O2 at home, 0 falls in past 6+ mo        OT Problem List: Cardiopulmonary status limiting activity;Decreased activity tolerance;Decreased knowledge of use of DME or AE      OT Treatment/Interventions: Self-care/ADL training;Therapeutic exercise;Therapeutic activities;Energy conservation;DME and/or AE instruction;Patient/family education    OT Goals(Current goals can be found in the care plan  section) Acute Rehab OT Goals Patient Stated Goal: To get stronger with better stamina OT Goal Formulation: With patient Time For Goal Achievement: 06/09/20 Potential to Achieve Goals: Good ADL Goals Pt Will Perform Lower Body Dressing: with modified independence;sit to/from stand;with adaptive equipment Pt Will Transfer to Toilet: with modified independence;ambulating (elevated commode, LRAD for amb) Additional ADL Goal #1: Pt will verbalize plan to implement at least 2 learned energy conservation strategies to maximize safety/indep with ADL and IADL tasks while minimizing SOB and over-exertion.  OT Frequency: Min 1X/week   Barriers to D/C:            Co-evaluation PT/OT/SLP Co-Evaluation/Treatment: Yes Reason for Co-Treatment: For patient/therapist safety;To address functional/ADL transfers PT goals addressed during session: Mobility/safety with mobility;Balance;Proper use of DME OT goals addressed during session: ADL's and self-care;Strengthening/ROM;Proper use of Adaptive equipment and DME      AM-PAC OT "6 Clicks" Daily Activity     Outcome Measure Help from another person eating meals?: None Help from another person taking care of personal grooming?: None Help from another person toileting, which includes using toliet, bedpan, or urinal?: A Little Help from another person bathing (including washing, rinsing, drying)?: A Little Help from  another person to put on and taking off regular upper body clothing?: None Help from another person to put on and taking off regular lower body clothing?: A Little 6 Click Score: 21   End of Session Equipment Utilized During Treatment: Gait belt;Rolling walker  Activity Tolerance: Patient tolerated treatment well Patient left: in chair;with call bell/phone within reach;with chair alarm set  OT Visit Diagnosis: Other abnormalities of gait and mobility (R26.89)                Time: 9604-5409 OT Time Calculation (min): 50 min Charges:  OT  General Charges $OT Visit: 1 Visit OT Evaluation $OT Eval Moderate Complexity: 1 Mod OT Treatments $Self Care/Home Management : 8-22 mins  Jeni Salles, MPH, MS, OTR/L ascom [phone] 05/26/20, 5:11 PM

## 2020-05-26 NOTE — Progress Notes (Signed)
OT Cancellation Note  Patient Details Name: Thomas Tanner MRN: [id number] DOB: [birth year]   Cancelled Treatment:    Reason Eval/Treat Not Completed: Fatigue/lethargy limiting ability to participate. Consult received, chart reviewed. Pt sleeping soundly upon attempt. Will hold OT evaluation this afternoon and re-attempt next date pending pt medically appropriate.   Richrd Prime, MPH, MS, OTR/L ascom [phone] 05/26/20, 2:39 PM

## 2020-05-27 DIAGNOSIS — R739 Hyperglycemia, unspecified: Secondary | ICD-10-CM

## 2020-05-27 LAB — BASIC METABOLIC PANEL
Anion gap: 10 (ref 5–15)
BUN: 24 mg/dL — ABNORMAL HIGH (ref 8–23)
CO2: 30 mmol/L (ref 22–32)
Calcium: 8.8 mg/dL — ABNORMAL LOW (ref 8.9–10.3)
Chloride: 96 mmol/L — ABNORMAL LOW (ref 98–111)
Creatinine, Ser: 1.37 mg/dL — ABNORMAL HIGH (ref 0.61–1.24)
GFR calc Af Amer: >60 mL/min (ref >60)
GFR calc non Af Amer: 53 mL/min — ABNORMAL LOW (ref >60)
Glucose, Bld: 111 mg/dL — ABNORMAL HIGH (ref 70–99)
Potassium: 4.2 mmol/L (ref 3.5–5.1)
Sodium: 136 mmol/L (ref 135–145)

## 2020-05-27 LAB — CBC
HCT: 41.4 % (ref 39.0–52.0)
Hemoglobin: 14.3 g/dL (ref 13.0–17.0)
MCH: 31.8 pg (ref 26.0–34.0)
MCHC: 34.5 g/dL (ref 30.0–36.0)
MCV: 92.2 fL (ref 80.0–100.0)
Platelets: 247 10*3/uL (ref 150–400)
RBC: 4.49 MIL/uL (ref 4.22–5.81)
RDW: 13.3 % (ref 11.5–15.5)
WBC: 6.8 10*3/uL (ref 4.0–10.5)
nRBC: 0 % (ref 0.0–0.2)

## 2020-05-27 LAB — HEMOGLOBIN A1C
Hgb A1c MFr Bld: 6.4 % — ABNORMAL HIGH (ref 4.8–5.6)
Mean Plasma Glucose: 137 mg/dL

## 2020-05-27 NOTE — Progress Notes (Signed)
PROGRESS NOTE    Thomas Tanner  VZD:[id number] DOB: 1951-05-10 DOA: 05/24/2020 PCP: [facility]    Assessment & Plan:   Active Problems:   CAD in native artery   Morbid obesity with BMI of 50.0-59.9, adult (HCC)   CHF (congestive heart failure) (HCC)   Acute on chronic systolic congestive heart failure: with anasarca. Improving. Echo shows EF 50-55%, unknown diastolic dysfunction. Continue on IV lasix. Strict I/Os. Continue on metoprolol and lisinopril. Neg fluid balance approx 3L.   Discontinue hydrochlorothiazide.  Morbid obesity: BMI of 54.97.  Significant weight loss needed  Hyperglycemia: HbA1c pending still   Hx of CAD: continue on aspirin, plavix, lisinopril, metoprolol and statin    Elevated D-dimer:  ultrasound lower extremity negative.  CAT scan of the chest nondiagnostic.  AKI: baseline Cr is unknown. Cr is labile. Will continue to monitor   Hyperlipidemia: continue on statin   COPD: w/o exacerbation. Continue home inhalers.  Smoker: nicotine patch to prevent w/drawal. Smoking cessation counseling   Generalized weakness: OT/PT recs SNF but pt refuses but pt is ok home health. Home health orders placed  DVT prophylaxis: lovenox Code Status: full  Family Communication:  Disposition Plan: likely d/c w/ home health as pt is refusing SNF. Unlikely any barriers    Status is: Inpatient  Remains inpatient appropriate because:IV treatments appropriate due to intensity of illness or inability to take PO   Dispo: The patient is from: Home              Anticipated d/c is to: home health               Anticipated d/c date is: 2 days              Patient currently is not medically stable to d/c.     Consultants:   Cardio    Procedures:    Antimicrobials:   Subjective: Pt c/o malaise  Objective: Vitals:   05/26/20 1600 05/26/20 2022 05/27/20 0440 05/27/20 0732  BP: 132/76 (!) 148/61 129/80 131/70  Pulse: 78 83 85 80    Resp: 19 (!) 24 (!) 24 18  Temp: 97.7 F (36.5 C) 98.4 F (36.9 C) 98.2 F (36.8 C) 98.7 F (37.1 C)  TempSrc: Oral Oral Oral   SpO2: 94% 95% 96% 95%  Weight:   (!) 204.6 kg   Height:        Intake/Output Summary (Last 24 hours) at 05/27/2020 0738 Last data filed at 05/27/2020 0420 Gross per 24 hour  Intake 843 ml  Output 4650 ml  Net -3807 ml   Filed Weights   05/25/20 1830 05/26/20 0431 05/27/20 0440  Weight: (!) 205.7 kg (!) 207.7 kg (!) 204.6 kg    Examination:  General exam: Appears calm and comfortable  Respiratory system: diminished breath sounds b/l. No rhonchi Cardiovascular system: S1 & S2 +. No rubs, gallops or clicks.  Gastrointestinal system: Abdomen is obese, soft and nontender.  Hypoactive bowel sounds heard. Central nervous system: Alert and oriented. Moves all 4 extremities  Psychiatry: Judgement and insight appear normal. Normal mood and affect     Data Reviewed: I have personally reviewed following labs and imaging studies  CBC: Recent Labs  Lab 05/24/20 0945 05/26/20 0505 05/27/20 0546  WBC 10.1 7.3 6.8  HGB 15.5 13.8 14.3  HCT 46.9 41.5 41.4  MCV 94.9 96.3 92.2  PLT 253 240 247   Basic Metabolic Panel: Recent Labs  Lab 05/24/20 0945 05/25/20 0515  05/26/20 0505 05/27/20 0546  NA 137 136 137 136  K 4.1 4.5 4.3 4.2  CL 95* 96* 95* 96*  CO2 29 29 31 30   GLUCOSE 134* 106* 116* 111*  BUN 19 20 26* 24*  CREATININE 1.46* 1.25* 1.49* 1.37*  CALCIUM 9.2 8.9 8.5* 8.8*   GFR: Estimated Creatinine Clearance: 96.1 mL/min (A) (by C-G formula based on SCr of 1.37 mg/dL (H)). Liver Function Tests: Recent Labs  Lab 05/24/20 0945  AST 18  ALT 14  ALKPHOS 68  BILITOT 0.9  PROT 7.1  ALBUMIN 3.8   No results for input(s): LIPASE, AMYLASE in the last 168 hours. No results for input(s): AMMONIA in the last 168 hours. Coagulation Profile: No results for input(s): INR, PROTIME in the last 168 hours. Cardiac Enzymes: No results for  input(s): CKTOTAL, CKMB, CKMBINDEX, TROPONINI in the last 168 hours. BNP (last 3 results) No results for input(s): PROBNP in the last 8760 hours. HbA1C: No results for input(s): HGBA1C in the last 72 hours. CBG: No results for input(s): GLUCAP in the last 168 hours. Lipid Profile: No results for input(s): CHOL, HDL, LDLCALC, TRIG, CHOLHDL, LDLDIRECT in the last 72 hours. Thyroid Function Tests: No results for input(s): TSH, T4TOTAL, FREET4, T3FREE, THYROIDAB in the last 72 hours. Anemia Panel: No results for input(s): VITAMINB12, FOLATE, FERRITIN, TIBC, IRON, RETICCTPCT in the last 72 hours. Sepsis Labs: No results for input(s): PROCALCITON, LATICACIDVEN in the last 168 hours.  Recent Results (from the past 240 hour(s))  SARS Coronavirus 2 by RT PCR (hospital order, performed in [name] hospital lab) Nasopharyngeal Nasopharyngeal Swab     Status: None   Collection Time: 05/24/20  6:05 PM   Specimen: Nasopharyngeal Swab  Result Value Ref Range Status   SARS Coronavirus 2 NEGATIVE NEGATIVE Final    Comment: (NOTE) SARS-CoV-2 target nucleic acids are NOT DETECTED.  The SARS-CoV-2 RNA is generally detectable in upper and lower respiratory specimens during the acute phase of infection. The lowest concentration of SARS-CoV-2 viral copies this assay can detect is 250 copies / mL. A negative result does not preclude SARS-CoV-2 infection and should not be used as the sole basis for treatment or other patient management decisions.  A negative result may occur with improper specimen collection / handling, submission of specimen other than nasopharyngeal swab, presence of viral mutation(s) within the areas targeted by this assay, and inadequate number of viral copies (<250 copies / mL). A negative result must be combined with clinical observations, patient history, and epidemiological information.  Fact Sheet for Patients:   05/26/20  Fact Sheet for  Healthcare Providers: BoilerBrush.com.cy  This test is not yet approved or  cleared by the https://pope.com/ FDA and has been authorized for detection and/or diagnosis of SARS-CoV-2 by FDA under an Emergency Use Authorization (EUA).  This EUA will remain in effect (meaning this test can be used) for the duration of the COVID-19 declaration under Section 564(b)(1) of the Act, 21 U.S.C. section 360bbb-3(b)(1), unless the authorization is terminated or revoked sooner.  Performed at [facility], [address] [place], [place]          Radiology Studies: ECHOCARDIOGRAM COMPLETE  Result Date: 05/25/2020    ECHOCARDIOGRAM REPORT   Patient Name:   BLAISE GRIESHABER Date of Exam: 05/25/2020 Medical Rec #:  05/27/2020                Height:       73.0 in Accession #:  [id number]               Weight:       416.7 lb Date of Birth:  [birth year]                 BSA:          2.939 m Patient Age:    69 years                 BP:           115/83 mmHg Patient Gender: M                        HR:           Not listed in chart bpm. Exam Location:  [facility] Procedure: 2D Echo, Cardiac Doppler and Color Doppler Indications:     CHF- acute diastolic 428.31  History:         Patient has prior history of Echocardiogram examinations, most                  recent 12/09/2018. COPD and TIA; Risk Factors:Hypertension and                  Diabetes.  Sonographer:     Cristela Blue RDCS (AE) Referring Phys:  [id number] Alford Highland Diagnosing Phys: Harold Hedge MD  Sonographer Comments: Technically difficult study due to poor echo windows, no apical window and no subcostal window. Image acquisition challenging due to COPD. IMPRESSIONS  1. Left ventricular ejection fraction, by estimation, is 50 to 55%. Left ventricular ejection fraction by PLAX is 53 %. The left ventricle has low normal function. The left ventricle has no regional wall motion abnormalities. There is mild left  ventricular hypertrophy. Left ventricular diastolic function could not be evaluated.  2. Right ventricular systolic function was not well visualized. The right ventricular size is not well visualized.  3. The mitral valve was not well visualized. No evidence of mitral valve regurgitation.  4. The aortic valve was not well visualized. Aortic valve regurgitation is not visualized. FINDINGS  Left Ventricle: Left ventricular ejection fraction, by estimation, is 50 to 55%. Left ventricular ejection fraction by PLAX is 53 %. The left ventricle has low normal function. The left ventricle has no regional wall motion abnormalities. The left ventricular internal cavity size was normal in size. There is mild left ventricular hypertrophy. Left ventricular diastolic function could not be evaluated. Right Ventricle: The right ventricular size is not well visualized. Right vetricular wall thickness was not assessed. Right ventricular systolic function was not well visualized. Left Atrium: Left atrial size was not well visualized. Right Atrium: Right atrial size was not well visualized. Pericardium: There is no evidence of pericardial effusion. Mitral Valve: The mitral valve was not well visualized. No evidence of mitral valve regurgitation. Tricuspid Valve: The tricuspid valve is not well visualized. Tricuspid valve regurgitation is not demonstrated. Aortic Valve: The aortic valve was not well visualized. Aortic valve regurgitation is not visualized. Pulmonic Valve: The pulmonic valve was not well visualized. Pulmonic valve regurgitation is not visualized. Aorta: The aortic root was not well visualized. IAS/Shunts: The interatrial septum was not assessed.  LEFT VENTRICLE PLAX 2D LV EF:         Left ventricular ejection fraction by PLAX is 53 %. LVIDd:         3.36 cm LVIDs:         2.48 cm LV PW:  1.25 cm LV IVS:        0.89 cm LVOT diam:     2.00 cm LVOT Area:     3.14 cm  LEFT ATRIUM         Index LA diam:    2.90 cm  0.99 cm/m   AORTA Ao Root diam: 1.70 cm  SHUNTS Systemic Diam: 2.00 cm Bartholome Bill MD Electronically signed by Bartholome Bill MD Signature Date/Time: 05/25/2020/12:14:47 PM    Final         Scheduled Meds: . aspirin  81 mg Oral Daily  . clopidogrel  75 mg Oral Daily  . enoxaparin (LOVENOX) injection  40 mg Subcutaneous Q12H  . famotidine  20 mg Oral BID  . furosemide  40 mg Intravenous Daily  . lisinopril  10 mg Oral Daily  . metoprolol succinate  50 mg Oral Daily  . nicotine  14 mg Transdermal Daily  . potassium chloride  20 mEq Oral BID  . rosuvastatin  20 mg Oral q1800  . sodium chloride flush  3 mL Intravenous Q12H  . triamcinolone cream  1 application Topical BID   Continuous Infusions: . sodium chloride       LOS: 3 days    Time spent: 32 mins     Wyvonnia Dusky, MD [facility] Pager [phone]  If 7PM-7AM, please contact night-coverage [email or web address] 05/27/2020, 7:38 AM

## 2020-05-27 NOTE — Care Management Important Message (Signed)
Important Message  Patient Details  Name: Thomas Tanner MRN: [id number] Date of Birth: Apr 14, 1951   Medicare Important Message Given:  Yes     [name] 05/27/2020, 2:41 PM

## 2020-05-28 DIAGNOSIS — I5023 Acute on chronic systolic (congestive) heart failure: Secondary | ICD-10-CM

## 2020-05-28 DIAGNOSIS — R7303 Prediabetes: Secondary | ICD-10-CM

## 2020-05-28 LAB — CBC
HCT: 44.3 % (ref 39.0–52.0)
Hemoglobin: 15 g/dL (ref 13.0–17.0)
MCH: 31.2 pg (ref 26.0–34.0)
MCHC: 33.9 g/dL (ref 30.0–36.0)
MCV: 92.1 fL (ref 80.0–100.0)
Platelets: 248 10*3/uL (ref 150–400)
RBC: 4.81 MIL/uL (ref 4.22–5.81)
RDW: 13.2 % (ref 11.5–15.5)
WBC: 7.2 10*3/uL (ref 4.0–10.5)
nRBC: 0 % (ref 0.0–0.2)

## 2020-05-28 LAB — BASIC METABOLIC PANEL
Anion gap: 12 (ref 5–15)
BUN: 22 mg/dL (ref 8–23)
CO2: 29 mmol/L (ref 22–32)
Calcium: 9 mg/dL (ref 8.9–10.3)
Chloride: 94 mmol/L — ABNORMAL LOW (ref 98–111)
Creatinine, Ser: 1.28 mg/dL — ABNORMAL HIGH (ref 0.61–1.24)
GFR calc Af Amer: >60 mL/min (ref >60)
GFR calc non Af Amer: 58 mL/min — ABNORMAL LOW (ref >60)
Glucose, Bld: 106 mg/dL — ABNORMAL HIGH (ref 70–99)
Potassium: 4.7 mmol/L (ref 3.5–5.1)
Sodium: 135 mmol/L (ref 135–145)

## 2020-05-28 MED ORDER — POTASSIUM CHLORIDE ER 10 MEQ PO TBCR: 10.0000 meq | EXTENDED_RELEASE_TABLET | Freq: Every day | ORAL | 0 refills | Status: DC

## 2020-05-28 MED ORDER — FUROSEMIDE 40 MG PO TABS
40.0000 mg | ORAL_TABLET | Freq: Every day | ORAL | 0 refills | Status: DC
Start: 2020-05-28 — End: 2020-07-20

## 2020-05-28 NOTE — TOC Transition Note (Signed)
Transition of Care [facility]) - CM/SW Discharge Note   Patient Details  Name: Thomas Tanner MRN: [id number] Date of Birth: [birth year]  [facility] of [facility]) CM/SW Contact:  Bremond Cellar, RN Phone Number: 05/28/2020, 3:31 PM   Clinical Narrative:    [facility] unable to accept patient for home health. Rosey Bath with [facility] unable to accept patient for home health. Denyse Amass @ [facility] unable to accept patient for home health. Awaiting response from Cassie @ [facility].          Patient Goals and CMS Choice        Discharge Placement                       Discharge Plan and Services                                     Social Determinants of Health (SDOH) Interventions     Readmission Risk Interventions No flowsheet data found.

## 2020-05-28 NOTE — Discharge Summary (Signed)
Physician Discharge Summary  Thomas Tanner QIO:[id number] DOB: August 03, 1951 DOA: 05/24/2020  PCP: [facility]  Admit date: 05/24/2020 Discharge date: 05/28/2020  Admitted From: home Disposition: home w/ home health   Recommendations for Outpatient Follow-up:  1. Follow up with PCP in 1-2 weeks, needs further evaluation & treatment for HbA1c 6.4 2.  F/u cardio, Dr. Clayborn Bigness, w/in 1 week  Home Health: yes Equipment/Devices:  Discharge Condition: stable CODE STATUS: full  Diet recommendation: Heart Healthy/carb modified  Brief/Interim Summary: HPI was taken from Dr. Leslye Peer: Thomas Tanner  is a 69 y.o. male coming in with leg swelling, weight gain, stomach and scrotum swelling and also shortness of breath.  He states sometimes he wheezes.  He was placed on Lasix 20 mg daily but has not helped.  He thinks he has gained some weight but has not measured.  Hospitalist services were contacted for admission for congestive heart failure.  Over a month ago he stated his wife was diagnosed with Covid and he thinks he had the same symptoms but was never diagnosed.  Hospital Course from Dr. Lenise Herald 6/16-6/19/21: Pt presented w/ shortness of breath secondary to likely CHF exacerbation. Echo showed EF 50-55% & unknown diastolic dysfunction. Pt responded pretty well to IV lasix and had an approx 4L neg fluid balance. Pt was d/c home w/ lasix 40 mg daily & KCl supplement daily. Pt will f/u w/ his cardiologist, Dr. Clayborn Bigness, w/in 1 week. Pt verbalized his understanding. Furthermore, pt was found to have HbA1c 6.4 and will likely need to start po metformin but will defer to pt's PCP for further evaluation, education & treatment. Also, PT/OT saw the pt and recommended SNF but pt refused and agreed to home health. Home health was being set up at the time of d/c by CM and CM will call the pt once home health is set up.   Discharge Diagnoses:  Active Problems:   CAD in native  artery   Morbid obesity with BMI of 50.0-59.9, adult (HCC)   CHF (congestive heart failure) (HCC)  Acute on chronic systolic congestive heart failure: with anasarca. Improving. Echo shows EF 32-44%, unknown diastolic dysfunction. Continue on IV lasix. Strict I/Os. Continue on metoprolol and lisinopril. Neg fluid balance approx 3L.   Discontinue hydrochlorothiazide.  Morbid obesity: BMI of 54.97.  Significant weight loss needed  Pre-DM2: HbA1c 6.4. Would benefit from significant weight loss  Hx of CAD: continue on aspirin, plavix, lisinopril, metoprolol and statin    Elevated D-dimer:  ultrasound lower extremity negative.  CAT scan of the chest nondiagnostic.  AKI: baseline Cr is unknown. Cr is labile. Will continue to monitor   Hyperlipidemia: continue on statin   COPD: w/o exacerbation. Continue home inhalers.  Smoker: nicotine patch to prevent w/drawal. Smoking cessation counseling   Generalized weakness: OT/PT recs SNF but pt refuses but pt is ok home health. Home health orders placed   Discharge Instructions  Discharge Instructions    Amb Referral to HF Clinic   Complete by: As directed    Diet - low sodium heart healthy   Complete by: As directed    Discharge instructions   Complete by: As directed    F/u PCP in 1-2 weeks. Needs further evaluation & treatment for HbA1c 6.4. F/u w/ cardio (Dr. Clayborn Bigness) within 1 week   Increase activity slowly   Complete by: As directed      Allergies as of 05/28/2020      Reactions   Morphine Anxiety,  Other (See Comments)   Morphine And Related Anxiety      Medication List    STOP taking these medications   meloxicam 7.5 MG tablet Commonly known as: MOBIC     TAKE these medications   aspirin 81 MG chewable tablet Chew 1 tablet (81 mg total) by mouth daily.   clopidogrel 75 MG tablet Commonly known as: PLAVIX Take 1 tablet (75 mg total) by mouth daily.   famotidine 20 MG tablet Commonly known as: PEPCID Take 1 tablet  (20 mg total) by mouth 2 (two) times daily.   furosemide 40 MG tablet Commonly known as: Lasix Take 1 tablet (40 mg total) by mouth daily. What changed:   medication strength  how much to take  when to take this  reasons to take this   glucose blood test strip 1 each by Other route as needed for other. Use as instructed   lisinopril-hydrochlorothiazide 20-12.5 MG tablet Commonly known as: ZESTORETIC Take 1 tablet by mouth daily.   metoprolol succinate 50 MG 24 hr tablet Commonly known as: TOPROL-XL Take 1 tablet (50 mg total) by mouth daily.   nicotine 14 mg/24hr patch Commonly known as: NICODERM CQ - dosed in mg/24 hours Place 1 patch (14 mg total) onto the skin daily.   Omega-3 500 MG Caps Take 1 tab po daily   OneTouch Delica Lancets 33G Misc by Does not apply route.   potassium chloride 10 MEQ tablet Commonly known as: KLOR-CON Take 1 tablet (10 mEq total) by mouth daily.   rosuvastatin 20 MG tablet Commonly known as: CRESTOR Take 1 tablet (20 mg total) by mouth daily.   triamcinolone cream 0.5 % Commonly known as: KENALOG Apply 1 application topically 2 (two) times daily.   Ventolin HFA 108 (90 Base) MCG/ACT inhaler Generic drug: albuterol Inhale 2 puffs into the lungs every 6 (six) hours as needed for wheezing or shortness of breath.       Follow-up Information    [facility] Follow up on 06/20/2020.   Specialty: Cardiology Why: at 3:00pm. Enter through the [facility] entrance Contact information: [address] [place] [address] [phone]             Allergies  Allergen Reactions  . Morphine Anxiety and Other (See Comments)  . Morphine And Related Anxiety    Consultations:     Procedures/Studies: DG Chest 2 View  Result Date: 05/24/2020 CLINICAL DATA:  Shortness of breath. EXAM: CHEST - 2 VIEW COMPARISON:  12/08/2018. FINDINGS: Mild cardiomegaly with  bilateral pulmonary interstitial prominence consistent with CHF. Pneumonitis cannot be excluded. No pleural effusion or pneumothorax. Degenerative change thoracic spine. IMPRESSION: Mild cardiomegaly. Bilateral interstitial prominence suggesting CHF. Pneumonitis cannot be excluded. Electronically Signed   By: Maisie Fus  Register   On: 05/24/2020 10:20   CT Angio Chest PE W and/or Wo Contrast  Result Date: 05/24/2020 CLINICAL DATA:  Increasing lower extremity edema for 1 month, recent COVID-19, elevated D-dimer, decreased renal function EXAM: CT ANGIOGRAPHY CHEST WITH CONTRAST TECHNIQUE: Multidetector CT imaging of the chest was performed using the standard protocol during bolus administration of intravenous contrast. Multiplanar CT image reconstructions and MIPs were obtained to evaluate the vascular anatomy. CONTRAST:  75mL OMNIPAQUE IOHEXOL 350 MG/ML SOLN COMPARISON:  05/24/2020 FINDINGS: Cardiovascular: This is a technically nondiagnostic evaluation of the pulmonary vasculature due to timing of the imaging after contrast bolus. There is insufficient enhancement of the pulmonary arterial system, and  pulmonary emboli cannot be excluded. The thoracic aorta demonstrates normal enhancement with no aneurysm or dissection. Moderate atherosclerosis of the aorta and coronary vasculature. The heart is not enlarged.  No pericardial effusion. Mediastinum/Nodes: No enlarged mediastinal, hilar, or axillary lymph nodes. Thyroid gland, trachea, and esophagus demonstrate no significant findings. Lungs/Pleura: No acute airspace disease, effusion, or pneumothorax. Central airways are widely patent. Upper Abdomen: No acute abnormality. 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 No acute or destructive bony lesions. Reconstructed images demonstrate no additional findings. Review of the MIP images confirms the above findings. IMPRESSION: 1. Nondiagnostic evaluation of the pulmonary  vasculature due to poor timing of the contrast bolus. No additional contrast was administered due to patient's underlying renal dysfunction. Pulmonary emboli cannot be excluded, and if pulmonary emboli remain a clinical concern nuclear medicine ventilation/perfusion scan could be considered. 2. No acute intrathoracic process. 3. Aortic Atherosclerosis (ICD10-I70.0). Coronary artery atherosclerosis. Electronically Signed   By: Sharlet SalinaMichael  Brown M.D.   On: 05/24/2020 16:09   US Venous Img Lower Bilateral  Result Date: 05/24/2020 CLINICAL DATA:  Bilateral lower extremity swelling 3 weeks EXAM: BILATERAL LOWER EXTREMITY VENOUS DOPPLER ULTRASOUND TECHNIQUE: Gray-scale sonography with graded compression, as well as color Doppler and duplex ultrasound were performed to evaluate the lower extremity deep venous systems from the level of the common femoral vein and including the common femoral, femoral, profunda femoral, popliteal and calf veins including the posterior tibial, peroneal and gastrocnemius veins when visible. The superficial great saphenous vein was also interrogated. Spectral Doppler was utilized to evaluate flow at rest and with distal augmentation maneuvers in the common femoral, femoral and popliteal veins. COMPARISON:  11/09/2011 FINDINGS: RIGHT LOWER EXTREMITY Common Femoral Vein: No evidence of thrombus. Normal compressibility, respiratory phasicity and response to augmentation. Saphenofemoral Junction: No evidence of thrombus. Normal compressibility and flow on color Doppler imaging. Profunda Femoral Vein: No evidence of thrombus. Normal compressibility and flow on color Doppler imaging. Femoral Vein: No evidence of thrombus. Normal compressibility, respiratory phasicity and response to augmentation. Popliteal Vein: No evidence of thrombus. Normal compressibility, respiratory phasicity and response to augmentation. Calf Veins: No evidence of thrombus. Normal compressibility and flow on color Doppler  imaging. Superficial Great Saphenous Vein: No evidence of thrombus. Normal compressibility. Venous Reflux:  None. Other Findings:  None. LEFT LOWER EXTREMITY Common Femoral Vein: No evidence of thrombus. Normal compressibility, respiratory phasicity and response to augmentation. Saphenofemoral Junction: No evidence of thrombus. Normal compressibility and flow on color Doppler imaging. Profunda Femoral Vein: No evidence of thrombus. Normal compressibility and flow on color Doppler imaging. Femoral Vein: No evidence of thrombus. Normal compressibility, respiratory phasicity and response to augmentation. Popliteal Vein: No evidence of thrombus. Normal compressibility, respiratory phasicity and response to augmentation. Calf Veins: No evidence of thrombus. Normal compressibility and flow on color Doppler imaging. Superficial Great Saphenous Vein: No evidence of thrombus. Normal compressibility. Venous Reflux:  None. Other Findings:  None. IMPRESSION: No evidence of deep venous thrombosis in either lower extremity. Electronically Signed   By: Duanne GuessNicholas  Plundo D.O.   On: 05/24/2020 16:42   ECHOCARDIOGRAM COMPLETE  Result Date: 05/25/2020    ECHOCARDIOGRAM REPORT   Patient Name:   Thomas Tanner Date of Exam: 05/25/2020 Medical Rec #:  [id number]                Height:       73.0 in Accession #:    [id number]  Weight:       416.7 lb Date of Birth:  1951-07-30                 BSA:          2.939 m Patient Age:    69 years                 BP:           115/83 mmHg Patient Gender: M                        HR:           Not listed in chart bpm. Exam Location:  [facility] Procedure: 2D Echo, Cardiac Doppler and Color Doppler Indications:     CHF- acute diastolic 428.31  History:         Patient has prior history of Echocardiogram examinations, most                  recent 12/09/2018. COPD and TIA; Risk Factors:Hypertension and                  Diabetes.  Sonographer:     Cristela Blue RDCS (AE) Referring Phys:   [id number] Alford Highland Diagnosing Phys: Harold Hedge MD  Sonographer Comments: Technically difficult study due to poor echo windows, no apical window and no subcostal window. Image acquisition challenging due to COPD. IMPRESSIONS  1. Left ventricular ejection fraction, by estimation, is 50 to 55%. Left ventricular ejection fraction by PLAX is 53 %. The left ventricle has low normal function. The left ventricle has no regional wall motion abnormalities. There is mild left ventricular hypertrophy. Left ventricular diastolic function could not be evaluated.  2. Right ventricular systolic function was not well visualized. The right ventricular size is not well visualized.  3. The mitral valve was not well visualized. No evidence of mitral valve regurgitation.  4. The aortic valve was not well visualized. Aortic valve regurgitation is not visualized. FINDINGS  Left Ventricle: Left ventricular ejection fraction, by estimation, is 50 to 55%. Left ventricular ejection fraction by PLAX is 53 %. The left ventricle has low normal function. The left ventricle has no regional wall motion abnormalities. The left ventricular internal cavity size was normal in size. There is mild left ventricular hypertrophy. Left ventricular diastolic function could not be evaluated. Right Ventricle: The right ventricular size is not well visualized. Right vetricular wall thickness was not assessed. Right ventricular systolic function was not well visualized. Left Atrium: Left atrial size was not well visualized. Right Atrium: Right atrial size was not well visualized. Pericardium: There is no evidence of pericardial effusion. Mitral Valve: The mitral valve was not well visualized. No evidence of mitral valve regurgitation. Tricuspid Valve: The tricuspid valve is not well visualized. Tricuspid valve regurgitation is not demonstrated. Aortic Valve: The aortic valve was not well visualized. Aortic valve regurgitation is not visualized. Pulmonic  Valve: The pulmonic valve was not well visualized. Pulmonic valve regurgitation is not visualized. Aorta: The aortic root was not well visualized. IAS/Shunts: The interatrial septum was not assessed.  LEFT VENTRICLE PLAX 2D LV EF:         Left ventricular ejection fraction by PLAX is 53 %. LVIDd:         3.36 cm LVIDs:         2.48 cm LV PW:         1.25 cm LV IVS:  0.89 cm LVOT diam:     2.00 cm LVOT Area:     3.14 cm  LEFT ATRIUM         Index LA diam:    2.90 cm 0.99 cm/m   AORTA Ao Root diam: 1.70 cm  SHUNTS Systemic Diam: 2.00 cm Harold Hedge MD Electronically signed by Harold Hedge MD Signature Date/Time: 05/25/2020/12:14:47 PM    Final        Subjective: Pt denies any complaints but states he would like to go home to see his son who is 38 years old as he is very close to his family.   Discharge Exam: Vitals:   05/28/20 1112 05/28/20 1217  BP:  137/68  Pulse:  78  Resp:  19  Temp:  98.4 F (36.9 C)  SpO2: 93% 92%   Vitals:   05/28/20 0820 05/28/20 1100 05/28/20 1112 05/28/20 1217  BP: 120/70   137/68  Pulse: 78   78  Resp: 19   19  Temp: 98 F (36.7 C)   98.4 F (36.9 C)  TempSrc: Oral   Oral  SpO2: 93% 90% 93% 92%  Weight:      Height:        General: Pt is alert, awake, not in acute distress Cardiovascular:  S1/S2 +, no rubs, no gallops Respiratory: diminished breath sounds b/l Abdominal: Soft, NT, obese, bowel sounds + Extremities:  no cyanosis    The results of significant diagnostics from this hospitalization (including imaging, microbiology, ancillary and laboratory) are listed below for reference.     Microbiology: Recent Results (from the past 240 hour(s))  SARS Coronavirus 2 by RT PCR (hospital order, performed in [facility] hospital lab) Nasopharyngeal Nasopharyngeal Swab     Status: None   Collection Time: 05/24/20  6:05 PM   Specimen: Nasopharyngeal Swab  Result Value Ref Range Status   SARS Coronavirus 2 NEGATIVE NEGATIVE Final    Comment:  (NOTE) SARS-CoV-2 target nucleic acids are NOT DETECTED.  The SARS-CoV-2 RNA is generally detectable in upper and lower respiratory specimens during the acute phase of infection. The lowest concentration of SARS-CoV-2 viral copies this assay can detect is 250 copies / mL. A negative result does not preclude SARS-CoV-2 infection and should not be used as the sole basis for treatment or other patient management decisions.  A negative result may occur with improper specimen collection / handling, submission of specimen other than nasopharyngeal swab, presence of viral mutation(s) within the areas targeted by this assay, and inadequate number of viral copies (<250 copies / mL). A negative result must be combined with clinical observations, patient history, and epidemiological information.  Fact Sheet for Patients:   BoilerBrush.com.cy  Fact Sheet for Healthcare Providers: https://pope.com/  This test is not yet approved or  cleared by the [place] FDA and has been authorized for detection and/or diagnosis of SARS-CoV-2 by FDA under an Emergency Use Authorization (EUA).  This EUA will remain in effect (meaning this test can be used) for the duration of the COVID-19 declaration under Section 564(b)(1) of the Act, 21 U.S.C. section 360bbb-3(b)(1), unless the authorization is terminated or revoked sooner.  Performed at [facility], [address] [place], [place] [address]      Labs: BNP (last 3 results) Recent Labs    05/24/20 0945  BNP 64.6   Basic Metabolic Panel: Recent Labs  Lab 05/24/20 0945 05/25/20 0515 05/26/20 0505 05/27/20 0546 05/28/20 0611  NA 137 136 137 136 135  K 4.1 4.5 4.3 4.2 4.7  CL 95* 96* 95* 96* 94*  CO2 29 29 31 30 29   GLUCOSE 134* 106* 116* 111* 106*  BUN 19 20 26* 24* 22  CREATININE 1.46* 1.25* 1.49* 1.37* 1.28*  CALCIUM 9.2 8.9 8.5* 8.8* 9.0   Liver Function Tests: Recent Labs   Lab 05/24/20 0945  AST 18  ALT 14  ALKPHOS 68  BILITOT 0.9  PROT 7.1  ALBUMIN 3.8   No results for input(s): LIPASE, AMYLASE in the last 168 hours. No results for input(s): AMMONIA in the last 168 hours. CBC: Recent Labs  Lab 05/24/20 0945 05/26/20 0505 05/27/20 0546 05/28/20 0611  WBC 10.1 7.3 6.8 7.2  HGB 15.5 13.8 14.3 15.0  HCT 46.9 41.5 41.4 44.3  MCV 94.9 96.3 92.2 92.1  PLT 253 240 247 248   Cardiac Enzymes: No results for input(s): CKTOTAL, CKMB, CKMBINDEX, TROPONINI in the last 168 hours. BNP: Invalid input(s): POCBNP CBG: No results for input(s): GLUCAP in the last 168 hours. D-Dimer No results for input(s): DDIMER in the last 72 hours. Hgb A1c Recent Labs    05/27/20 0546  HGBA1C 6.4*   Lipid Profile No results for input(s): CHOL, HDL, LDLCALC, TRIG, CHOLHDL, LDLDIRECT in the last 72 hours. Thyroid function studies No results for input(s): TSH, T4TOTAL, T3FREE, THYROIDAB in the last 72 hours.  Invalid input(s): FREET3 Anemia work up No results for input(s): VITAMINB12, FOLATE, FERRITIN, TIBC, IRON, RETICCTPCT in the last 72 hours. Urinalysis    Component Value Date/Time   COLORURINE Yellow 06/10/2013 1212   APPEARANCEUR Clear 06/10/2013 1212   LABSPEC 1.019 06/10/2013 1212   PHURINE 5.0 06/10/2013 1212   GLUCOSEU Negative 06/10/2013 1212   HGBUR 1+ 06/10/2013 1212   BILIRUBINUR Negative 06/10/2013 1212   KETONESUR Negative 06/10/2013 1212   PROTEINUR Negative 06/10/2013 1212   NITRITE Negative 06/10/2013 1212   LEUKOCYTESUR Negative 06/10/2013 1212   Sepsis Labs Invalid input(s): PROCALCITONIN,  WBC,  LACTICIDVEN Microbiology Recent Results (from the past 240 hour(s))  SARS Coronavirus 2 by RT PCR (hospital order, performed in [facility] hospital lab) Nasopharyngeal Nasopharyngeal Swab     Status: None   Collection Time: 05/24/20  6:05 PM   Specimen: Nasopharyngeal Swab  Result Value Ref Range Status   SARS Coronavirus 2 NEGATIVE  NEGATIVE Final    Comment: (NOTE) SARS-CoV-2 target nucleic acids are NOT DETECTED.  The SARS-CoV-2 RNA is generally detectable in upper and lower respiratory specimens during the acute phase of infection. The lowest concentration of SARS-CoV-2 viral copies this assay can detect is 250 copies / mL. A negative result does not preclude SARS-CoV-2 infection and should not be used as the sole basis for treatment or other patient management decisions.  A negative result may occur with improper specimen collection / handling, submission of specimen other than nasopharyngeal swab, presence of viral mutation(s) within the areas targeted by this assay, and inadequate number of viral copies (<250 copies / mL). A negative result must be combined with clinical observations, patient history, and epidemiological information.  Fact Sheet for Patients:   05/26/20  Fact Sheet for Healthcare Providers: BoilerBrush.com.cy  This test is not yet approved or  cleared by the https://pope.com/ FDA and has been authorized for detection and/or diagnosis of SARS-CoV-2 by FDA under an Emergency Use Authorization (EUA).  This EUA will remain in effect (meaning this test can be used) for the duration of the COVID-19 declaration under Section 564(b)(1) of the Act, 21  U.S.C. section 360bbb-3(b)(1), unless the authorization is terminated or revoked sooner.  Performed at [facility] [address], [place], [place] [address]      Time coordinating discharge: Over 30 minutes  SIGNED:   Charise Killian, MD  [facility] 05/28/2020, 12:32 PM Pager   If 7PM-7AM, please contact night-coverage [email or web address]

## 2020-05-28 NOTE — Discharge Summary (Signed)
D/c summary was miss-labeled as progress note 05/28/20. Please see that note for full d/c summary

## 2020-05-28 NOTE — TOC Transition Note (Addendum)
Transition of Care [facility]) - CM/SW Discharge Note   Patient Details  Name: Sriman Tally MRN: [id number] Date of Birth: Mar 23, 1951  [facility] CM/SW Contact:  Woodland Cellar, RN Phone Number: 05/28/2020, 12:24 PM   Clinical Narrative:    Spoke to patient at bedside. Patient anticipating discharge home today with home health for PT/OT and Nursing. Patient with no preference on agency. Uses walker at baseline and does not need assistance with ADL's. Strong family support in the home. Patient is active smoker.  Spoke to [place] with [facility] for possible admission.          Patient Goals and CMS Choice        Discharge Placement                       Discharge Plan and Services                                     Social Determinants of Health (SDOH) Interventions     Readmission Risk Interventions No flowsheet data found.

## 2020-05-28 NOTE — Plan of Care (Signed)

## 2020-05-31 ENCOUNTER — Telehealth: Payer: Self-pay | Admitting: Family

## 2020-05-31 NOTE — Telephone Encounter (Signed)
Unable to reach patient regarding his new patient appointment with [place] on 7/12 at 3pm.    Deetta Perla, NT

## 2020-06-06 ENCOUNTER — Encounter: Payer: Self-pay | Admitting: Adult Health

## 2020-06-06 ENCOUNTER — Ambulatory Visit (INDEPENDENT_AMBULATORY_CARE_PROVIDER_SITE_OTHER): Payer: Medicare Other | Admitting: Adult Health

## 2020-06-06 VITALS — Resp 16 | Ht 73.0 in | Wt >= 6400 oz

## 2020-06-06 DIAGNOSIS — I5022 Chronic systolic (congestive) heart failure: Secondary | ICD-10-CM

## 2020-06-06 DIAGNOSIS — J4520 Mild intermittent asthma, uncomplicated: Secondary | ICD-10-CM

## 2020-06-06 DIAGNOSIS — R609 Edema, unspecified: Secondary | ICD-10-CM

## 2020-06-06 DIAGNOSIS — R7309 Other abnormal glucose: Secondary | ICD-10-CM | POA: Diagnosis not present

## 2020-06-06 NOTE — Progress Notes (Signed)
[facility] [address] [place], [place] [address]  Internal MEDICINE  Telephone Visit  Patient Name: Thomas Tanner  [id number]  [id number]  Date of Service: 06/06/2020 ((Transition of care note)))  I connected with the patient at 237 by telephone and verified the patients identity using two identifiers.   I discussed the limitations, risks, security and privacy concerns of performing an evaluation and management service by telephone and the availability of in person appointments. I also discussed with the patient that there may be a patient responsible charge related to the service.  The patient expressed understanding and agrees to proceed.    Chief Complaint  Patient presents with  . Telephone Assessment  . Telephone Screen  . Follow-up    bones hurt   . Anxiety    HPI  Pt seen via telephone. Patient reports he went to the ER on 05/24/20 for swelling on his feet.  He was diagnosed with CHF.  He reports he had fluid in his lungs, heart, legs, and testicles. He was given lasix and is feeling much better. He was sent home to follow up with PCp and cardiology.  He has an upcoming appt on 06/20/20.        Current Medication: Outpatient Encounter Medications as of 06/06/2020  Medication Sig  . aspirin 81 MG chewable tablet Chew 1 tablet (81 mg total) by mouth daily.  . clopidogrel (PLAVIX) 75 MG tablet Take 1 tablet (75 mg total) by mouth daily.  . famotidine (PEPCID) 20 MG tablet Take 1 tablet (20 mg total) by mouth 2 (two) times daily.  . furosemide (LASIX) 40 MG tablet Take 1 tablet (40 mg total) by mouth daily.  Marland Kitchen glucose blood test strip 1 each by Other route as needed for other. Use as instructed  . Krill Oil (OMEGA-3) 500 MG CAPS Take 1 tab po daily  . lisinopril-hydrochlorothiazide (ZESTORETIC) 20-12.5 MG tablet Take 1 tablet by mouth daily.  . metoprolol succinate (TOPROL-XL) 50 MG 24 hr tablet Take 1 tablet (50 mg total) by mouth daily.  Letta Pate  DELICA LANCETS 33G MISC by Does not apply route.  . potassium chloride (KLOR-CON) 10 MEQ tablet Take 1 tablet (10 mEq total) by mouth daily.  . rosuvastatin (CRESTOR) 20 MG tablet Take 1 tablet (20 mg total) by mouth daily.  Marland Kitchen triamcinolone cream (KENALOG) 0.5 % Apply 1 application topically 2 (two) times daily.  . VENTOLIN HFA 108 (90 Base) MCG/ACT inhaler Inhale 2 puffs into the lungs every 6 (six) hours as needed for wheezing or shortness of breath.  . [DISCONTINUED] nicotine (NICODERM CQ - DOSED IN MG/24 HOURS) 14 mg/24hr patch Place 1 patch (14 mg total) onto the skin daily. (Patient not taking: Reported on 05/24/2020)   No facility-administered encounter medications on file as of 06/06/2020.    Surgical History: Past Surgical History:  Procedure Laterality Date  . CORONARY/GRAFT ACUTE MI REVASCULARIZATION N/A 12/08/2018   Procedure: Coronary/Graft Acute MI Revascularization;  Surgeon: Alwyn Pea, MD;  Location: [facility] INVASIVE CV LAB;  Service: Cardiovascular;  Laterality: N/A;  . KIDNEY STONE SURGERY    . left arm surgery  1963  . LEFT HEART CATH AND CORONARY ANGIOGRAPHY N/A 12/08/2018   Procedure: LEFT HEART CATH AND CORONARY ANGIOGRAPHY;  Surgeon: Alwyn Pea, MD;  Location: [facility] INVASIVE CV LAB;  Service: Cardiovascular;  Laterality: N/A;    Medical History: Past Medical History:  Diagnosis Date  . Arthritis   . COPD (chronic obstructive pulmonary  disease) (HCC)   . Diabetes (HCC)   . Hyperlipidemia   . Hypertension   . Kidney stone   . Obesity   . Tachycardia   . TIA (transient ischemic attack)     Family History: Family History  Problem Relation Age of Onset  . Alzheimer's disease Mother   . CAD Father     Social History   Socioeconomic History  . Marital status: Married    Spouse name: Not on file  . Number of children: Not on file  . Years of education: Not on file  . Highest education level: Not on file  Occupational History  . Not on file   Tobacco Use  . Smoking status: Current Every Day Smoker    Packs/day: 0.50    Types: Cigarettes  . Smokeless tobacco: Never Used  . Tobacco comment: pt has cut back on amount and currently using the patch to assist with quiting  Substance and Sexual Activity  . Alcohol use: No  . Drug use: No  . Sexual activity: Not Currently  Other Topics Concern  . Not on file  Social History Narrative  . Not on file   Social Determinants of Health   Financial Resource Strain:   . Difficulty of Paying Living Expenses:   Food Insecurity:   . Worried About Programme researcher, broadcasting/film/video in the Last Year:   . Barista in the Last Year:   Transportation Needs:   . Freight forwarder (Medical):   Marland Kitchen Lack of Transportation (Non-Medical):   Physical Activity:   . Days of Exercise per Week:   . Minutes of Exercise per Session:   Stress:   . Feeling of Stress :   Social Connections:   . Frequency of Communication with Friends and Family:   . Frequency of Social Gatherings with Friends and Family:   . Attends Religious Services:   . Active Member of Clubs or Organizations:   . Attends Banker Meetings:   Marland Kitchen Marital Status:   Intimate Partner Violence:   . Fear of Current or Ex-Partner:   . Emotionally Abused:   Marland Kitchen Physically Abused:   . Sexually Abused:       Review of Systems  Constitutional: Negative.  Negative for chills, fatigue and unexpected weight change.  HENT: Negative.  Negative for congestion, rhinorrhea, sneezing and sore throat.   Eyes: Negative for redness.  Respiratory: Negative.  Negative for cough, chest tightness and shortness of breath.   Cardiovascular: Negative.  Negative for chest pain and palpitations.  Gastrointestinal: Negative.  Negative for abdominal pain, constipation, diarrhea, nausea and vomiting.  Endocrine: Negative.   Genitourinary: Negative.  Negative for dysuria and frequency.  Musculoskeletal: Negative.  Negative for arthralgias, back  pain, joint swelling and neck pain.  Skin: Negative.  Negative for rash.  Allergic/Immunologic: Negative.   Neurological: Negative.  Negative for tremors and numbness.  Hematological: Negative for adenopathy. Does not bruise/bleed easily.  Psychiatric/Behavioral: Negative.  Negative for behavioral problems, sleep disturbance and suicidal ideas. The patient is not nervous/anxious.     Vital Signs: Resp 16   Ht 6\' 1"  (1.854 m)   Wt (!) 444 lb (201.4 kg)   BMI 58.58 kg/m    Observation/Objective:  Well sounding, NAD noted.    Assessment/Plan: 1. Chronic systolic congestive heart failure (HCC) Stable, patient has taken lasix, and now has appt with cardiology on 06/20/20.  Follow up as discussed  2. Edema, unspecified type Resolving with  lasix.   3. Mild intermittent asthma, unspecified whether complicated Stable, continue current management.   4. Elevated hemoglobin A1c measurement Will recheck in office at next visit. Was 6.4 in hospital.   General Counseling: Rahsaan verbalizes understanding of the findings of today's phone visit and agrees with plan of treatment. I have discussed any further diagnostic evaluation that may be needed or ordered today. We also reviewed his medications today. he has been encouraged to call the office with any questions or concerns that should arise related to todays visit.    No orders of the defined types were placed in this encounter.   No orders of the defined types were placed in this encounter.   Time spent:25 Minutes    Orson Gear AGNP-C Internal medicine

## 2020-06-15 ENCOUNTER — Ambulatory Visit: Payer: Medicare Other | Admitting: Internal Medicine

## 2020-06-20 ENCOUNTER — Ambulatory Visit: Payer: Medicare Other | Admitting: Family

## 2020-06-20 ENCOUNTER — Telehealth: Payer: Self-pay | Admitting: Family

## 2020-06-20 NOTE — Telephone Encounter (Signed)
Patient did not show for his [facility] appointment on 06/20/20. Will attempt to reschedule.

## 2020-06-27 ENCOUNTER — Other Ambulatory Visit: Payer: Self-pay

## 2020-06-27 DIAGNOSIS — J4520 Mild intermittent asthma, uncomplicated: Secondary | ICD-10-CM

## 2020-06-27 MED ORDER — VENTOLIN HFA 108 (90 BASE) MCG/ACT IN AERS: 2.0000 | INHALATION_SPRAY | Freq: Four times a day (QID) | RESPIRATORY_TRACT | 1 refills | Status: DC | PRN

## 2020-06-29 ENCOUNTER — Emergency Department
Admission: EM | Admit: 2020-06-29 | Discharge: 2020-06-29 | Discharge status: Against Medical Advice | Payer: Medicare Other

## 2020-07-02 ENCOUNTER — Emergency Department
Admission: EM | Admit: 2020-07-02 | Discharge: 2020-07-02 | Disposition: A | Discharge status: Home / Self Care | Payer: Medicare Other | Attending: Emergency Medicine | Admitting: Emergency Medicine

## 2020-07-02 ENCOUNTER — Other Ambulatory Visit: Payer: Self-pay

## 2020-07-02 ENCOUNTER — Emergency Department: Payer: Medicare Other

## 2020-07-02 DIAGNOSIS — I10 Essential (primary) hypertension: Secondary | ICD-10-CM | POA: Insufficient documentation

## 2020-07-02 DIAGNOSIS — M79671 Pain in right foot: Secondary | ICD-10-CM | POA: Diagnosis not present

## 2020-07-02 DIAGNOSIS — J449 Chronic obstructive pulmonary disease, unspecified: Secondary | ICD-10-CM | POA: Insufficient documentation

## 2020-07-02 DIAGNOSIS — R000 Tachycardia, unspecified: Secondary | ICD-10-CM | POA: Diagnosis not present

## 2020-07-02 DIAGNOSIS — I872 Venous insufficiency (chronic) (peripheral): Secondary | ICD-10-CM | POA: Insufficient documentation

## 2020-07-02 DIAGNOSIS — R600 Localized edema: Secondary | ICD-10-CM | POA: Diagnosis not present

## 2020-07-02 DIAGNOSIS — M7989 Other specified soft tissue disorders: Secondary | ICD-10-CM | POA: Diagnosis not present

## 2020-07-02 DIAGNOSIS — E119 Type 2 diabetes mellitus without complications: Secondary | ICD-10-CM | POA: Insufficient documentation

## 2020-07-02 DIAGNOSIS — Z743 Need for continuous supervision: Secondary | ICD-10-CM | POA: Diagnosis not present

## 2020-07-02 DIAGNOSIS — R509 Fever, unspecified: Secondary | ICD-10-CM | POA: Diagnosis not present

## 2020-07-02 DIAGNOSIS — M79604 Pain in right leg: Secondary | ICD-10-CM | POA: Insufficient documentation

## 2020-07-02 DIAGNOSIS — R5381 Other malaise: Secondary | ICD-10-CM | POA: Diagnosis not present

## 2020-07-02 DIAGNOSIS — M7731 Calcaneal spur, right foot: Secondary | ICD-10-CM | POA: Diagnosis not present

## 2020-07-02 DIAGNOSIS — Z955 Presence of coronary angioplasty implant and graft: Secondary | ICD-10-CM | POA: Insufficient documentation

## 2020-07-02 DIAGNOSIS — R609 Edema, unspecified: Secondary | ICD-10-CM

## 2020-07-02 DIAGNOSIS — L089 Local infection of the skin and subcutaneous tissue, unspecified: Secondary | ICD-10-CM | POA: Diagnosis present

## 2020-07-02 DIAGNOSIS — R6889 Other general symptoms and signs: Secondary | ICD-10-CM | POA: Diagnosis not present

## 2020-07-02 LAB — BLOOD GAS, VENOUS
Acid-Base Excess: 0.5 mmol/L (ref 0.0–2.0)
Bicarbonate: 25.4 mmol/L (ref 20.0–28.0)
O2 Saturation: 91 %
Patient temperature: 37
pCO2, Ven: 41 mmHg — ABNORMAL LOW (ref 44.0–60.0)
pH, Ven: 7.4 (ref 7.250–7.430)
pO2, Ven: 61 mmHg — ABNORMAL HIGH (ref 32.0–45.0)

## 2020-07-02 LAB — COMPREHENSIVE METABOLIC PANEL
ALT: 17 U/L (ref 0–44)
AST: 20 U/L (ref 15–41)
Albumin: 3.6 g/dL (ref 3.5–5.0)
Alkaline Phosphatase: 66 U/L (ref 38–126)
Anion gap: 13 (ref 5–15)
BUN: 33 mg/dL — ABNORMAL HIGH (ref 8–23)
CO2: 25 mmol/L (ref 22–32)
Calcium: 9 mg/dL (ref 8.9–10.3)
Chloride: 98 mmol/L (ref 98–111)
Creatinine, Ser: 1.58 mg/dL — ABNORMAL HIGH (ref 0.61–1.24)
GFR calc Af Amer: 52 mL/min — ABNORMAL LOW (ref >60)
GFR calc non Af Amer: 45 mL/min — ABNORMAL LOW (ref >60)
Glucose, Bld: 148 mg/dL — ABNORMAL HIGH (ref 70–99)
Potassium: 3.9 mmol/L (ref 3.5–5.1)
Sodium: 136 mmol/L (ref 135–145)
Total Bilirubin: 0.8 mg/dL (ref 0.3–1.2)
Total Protein: 6.8 g/dL (ref 6.5–8.1)

## 2020-07-02 LAB — CBC WITH DIFFERENTIAL/PLATELET
Abs Immature Granulocytes: 0.04 10*3/uL (ref 0.00–0.07)
Basophils Absolute: 0 10*3/uL (ref 0.0–0.1)
Basophils Relative: 0 %
Eosinophils Absolute: 0 10*3/uL (ref 0.0–0.5)
Eosinophils Relative: 0 %
HCT: 40 % (ref 39.0–52.0)
Hemoglobin: 13.8 g/dL (ref 13.0–17.0)
Immature Granulocytes: 0 %
Lymphocytes Relative: 9 %
Lymphs Abs: 0.9 10*3/uL (ref 0.7–4.0)
MCH: 32.3 pg (ref 26.0–34.0)
MCHC: 34.5 g/dL (ref 30.0–36.0)
MCV: 93.7 fL (ref 80.0–100.0)
Monocytes Absolute: 0.9 10*3/uL (ref 0.1–1.0)
Monocytes Relative: 8 %
Neutro Abs: 8.6 10*3/uL — ABNORMAL HIGH (ref 1.7–7.7)
Neutrophils Relative %: 83 %
Platelets: 296 10*3/uL (ref 150–400)
RBC: 4.27 MIL/uL (ref 4.22–5.81)
RDW: 14 % (ref 11.5–15.5)
WBC: 10.5 10*3/uL (ref 4.0–10.5)
nRBC: 0 % (ref 0.0–0.2)

## 2020-07-02 LAB — TROPONIN I (HIGH SENSITIVITY): Troponin I (High Sensitivity): 10 ng/L (ref <18)

## 2020-07-02 MED ORDER — CEPHALEXIN 500 MG PO CAPS: 500.0000 mg | ORAL_CAPSULE | Freq: Four times a day (QID) | ORAL | 0 refills | Status: DC

## 2020-07-02 NOTE — ED Triage Notes (Signed)
Pt arrives vis EMS from home after having weeping wounds in his right leg- pt states he has had this for about a week- pt right leg swollen- pt was recently hospitalized for "fluid" and was put on lasix

## 2020-07-02 NOTE — ED Notes (Signed)
X-ray at bedside

## 2020-07-02 NOTE — ED Notes (Signed)
TV turned on for pt and pt given cup of water

## 2020-07-02 NOTE — ED Notes (Signed)
Right lower leg cleaned with soap and water, patted dry, and una boots applied to bilat lower extremities, pt tol well, instructions given regarding sensation of tightness of the bandage and pt stated understanding, that the bandage is not to fit too tightly, pt states that the bandages feel fine bilat.

## 2020-07-02 NOTE — ED Notes (Signed)
Pt given warm blanket and updated on delay

## 2020-07-02 NOTE — ED Provider Notes (Signed)
ER Provider Note       Time seen: 11:37 AM    I have reviewed the vital signs and the nursing notes.  HISTORY   Chief Complaint Recurrent Skin Infections   HPI Thomas Tanner is a 69 y.o. male with a history of arthritis, COPD, diabetes, hyperlipidemia, hypertension, TIA who presents today for wounds on his right leg and pain in the right his right foot and heel.  Patient states he was recently hospitalized for fluid and was put on Lasix.  He has been taking them as prescribed.  Pain is 4 out of 10 in the right leg with burning.  Past Medical History:  Diagnosis Date  . Arthritis   . COPD (chronic obstructive pulmonary disease) (HCC)   . Diabetes (HCC)   . Hyperlipidemia   . Hypertension   . Kidney stone   . Obesity   . Tachycardia   . TIA (transient ischemic attack)     Past Surgical History:  Procedure Laterality Date  . CORONARY/GRAFT ACUTE MI REVASCULARIZATION N/A 12/08/2018   Procedure: Coronary/Graft Acute MI Revascularization;  Surgeon: Alwyn Pea, MD;  Location: [facility] INVASIVE CV LAB;  Service: Cardiovascular;  Laterality: N/A;  . KIDNEY STONE SURGERY    . left arm surgery  1963  . LEFT HEART CATH AND CORONARY ANGIOGRAPHY N/A 12/08/2018   Procedure: LEFT HEART CATH AND CORONARY ANGIOGRAPHY;  Surgeon: Alwyn Pea, MD;  Location: [facility] INVASIVE CV LAB;  Service: Cardiovascular;  Laterality: N/A;    Allergies Morphine and Morphine and related  Review of Systems Constitutional: Negative for fever. Cardiovascular: Negative for chest pain. Respiratory: Negative for shortness of breath. Gastrointestinal: Negative for abdominal pain, vomiting and diarrhea. Musculoskeletal: Positive for leg pain, positive for foot pain Skin: Positive for  wounds on his bilateral lower extremities Neurological: Negative for headaches, focal weakness or numbness.  All systems negative/normal/unremarkable except as stated in the  HPI  ____________________________________________   PHYSICAL EXAM:  VITAL SIGNS: Vitals:   07/02/20 1125 07/02/20 1126  BP: (!) 108/59   Pulse: (!) 112   Resp: 15   Temp:  98 F (36.7 C)  SpO2: 93%     Constitutional: Alert and oriented. Well appearing and in no distress. Eyes: Conjunctivae are normal. Normal extraocular movements. ENT      Head: Normocephalic and atraumatic.      Nose: No congestion/rhinnorhea.      Mouth/Throat: Mucous membranes are moist.      Neck: No stridor. Cardiovascular: Normal rate, regular rhythm. No murmurs, rubs, or gallops. Respiratory: Normal respiratory effort without tachypnea nor retractions. Breath sounds are clear and equal bilaterally. No wheezes/rales/rhonchi. Gastrointestinal: Soft and nontender. Normal bowel sounds Musculoskeletal: Nontender with normal range of motion in extremities. No lower extremity tenderness nor edema. Neurologic:  Normal speech and language. No gross focal neurologic deficits are appreciated.  Skin:  Skin is warm, dry and intact. No rash noted. Psychiatric: Speech and behavior are normal.  ____________________________________________  EKG: Interpreted by me.  Sinus tachycardia with a rate of 112 bpm, borderline wide QRS, normal axis, normal QT  ____________________________________________   LABS (pertinent positives/negatives)  Labs Reviewed  CBC WITH DIFFERENTIAL/PLATELET - Abnormal; Notable for the following components:      Result Value   Neutro Abs 8.6 (*)    All other components within normal limits  BLOOD GAS, VENOUS - Abnormal; Notable for the following components:   pCO2, Ven 41 (*)    pO2, Ven 61.0 (*)  All other components within normal limits  COMPREHENSIVE METABOLIC PANEL - Abnormal; Notable for the following components:   Glucose, Bld 148 (*)    BUN 33 (*)    Creatinine, Ser 1.58 (*)    GFR calc non Af Amer 45 (*)    GFR calc Af Amer 52 (*)    All other components within normal  limits  CULTURE, BLOOD (ROUTINE X 2)  CULTURE, BLOOD (ROUTINE X 2)  URINE CULTURE  TROPONIN I (HIGH SENSITIVITY)  TROPONIN I (HIGH SENSITIVITY)    RADIOLOGY  Images were viewed by me Right foot x-rays IMPRESSION: No acute cardiopulmonary disease IMPRESSION: Soft tissue swelling over the mid to forefoot. No air in the soft tissues. No evidence of underlying osteomyelitis.  DIFFERENTIAL DIAGNOSIS  Lymphedema, venous stasis, renal failure, dehydration, electrolyte abnormality, cellulitis, pressure ulcerations  ASSESSMENT AND PLAN  Peripheral edema, venous stasis, skin ulcerations   Plan: The patient had presented for wounds on both legs. Patient's labs were overall reassuring and not significantly changed from prior.  We were able to place bilateral Unna boot dressings and he will be referred to the wound clinic for outpatient follow-up.  Daryel November MD    Note: This note was generated in part or whole with voice recognition software. Voice recognition is usually quite accurate but there are transcription errors that can and very often do occur. I apologize for any typographical errors that were not detected and corrected.     Emily Filbert, MD 07/02/20 [phone]

## 2020-07-07 LAB — CULTURE, BLOOD (ROUTINE X 2)
Culture: NO GROWTH
Culture: NO GROWTH
Special Requests: ADEQUATE

## 2020-07-10 ENCOUNTER — Encounter: Payer: Self-pay | Admitting: Emergency Medicine

## 2020-07-10 ENCOUNTER — Other Ambulatory Visit: Payer: Self-pay

## 2020-07-10 ENCOUNTER — Inpatient Hospital Stay
Admission: EM | Admit: 2020-07-10 | Discharge: 2020-07-18 | DRG: 280 | Disposition: A | Discharge status: Home / Self Care | Payer: Medicare Other | Attending: Internal Medicine | Admitting: Internal Medicine

## 2020-07-10 ENCOUNTER — Emergency Department: Payer: Medicare Other

## 2020-07-10 DIAGNOSIS — Z7902 Long term (current) use of antithrombotics/antiplatelets: Secondary | ICD-10-CM

## 2020-07-10 DIAGNOSIS — N1831 Chronic kidney disease, stage 3a: Secondary | ICD-10-CM | POA: Diagnosis not present

## 2020-07-10 DIAGNOSIS — Z885 Allergy status to narcotic agent status: Secondary | ICD-10-CM

## 2020-07-10 DIAGNOSIS — F17200 Nicotine dependence, unspecified, uncomplicated: Secondary | ICD-10-CM | POA: Diagnosis not present

## 2020-07-10 DIAGNOSIS — J80 Acute respiratory distress syndrome: Secondary | ICD-10-CM | POA: Diagnosis not present

## 2020-07-10 DIAGNOSIS — Z8249 Family history of ischemic heart disease and other diseases of the circulatory system: Secondary | ICD-10-CM

## 2020-07-10 DIAGNOSIS — Z6841 Body Mass Index (BMI) 40.0 and over, adult: Secondary | ICD-10-CM | POA: Diagnosis not present

## 2020-07-10 DIAGNOSIS — Z8673 Personal history of transient ischemic attack (TIA), and cerebral infarction without residual deficits: Secondary | ICD-10-CM

## 2020-07-10 DIAGNOSIS — E785 Hyperlipidemia, unspecified: Secondary | ICD-10-CM | POA: Diagnosis not present

## 2020-07-10 DIAGNOSIS — L03115 Cellulitis of right lower limb: Secondary | ICD-10-CM | POA: Diagnosis present

## 2020-07-10 DIAGNOSIS — I252 Old myocardial infarction: Secondary | ICD-10-CM

## 2020-07-10 DIAGNOSIS — R0902 Hypoxemia: Secondary | ICD-10-CM | POA: Diagnosis not present

## 2020-07-10 DIAGNOSIS — Z20822 Contact with and (suspected) exposure to covid-19: Secondary | ICD-10-CM | POA: Diagnosis not present

## 2020-07-10 DIAGNOSIS — R7989 Other specified abnormal findings of blood chemistry: Secondary | ICD-10-CM | POA: Diagnosis not present

## 2020-07-10 DIAGNOSIS — Z955 Presence of coronary angioplasty implant and graft: Secondary | ICD-10-CM | POA: Diagnosis not present

## 2020-07-10 DIAGNOSIS — J449 Chronic obstructive pulmonary disease, unspecified: Secondary | ICD-10-CM | POA: Diagnosis present

## 2020-07-10 DIAGNOSIS — I2510 Atherosclerotic heart disease of native coronary artery without angina pectoris: Secondary | ICD-10-CM | POA: Diagnosis present

## 2020-07-10 DIAGNOSIS — L8990 Pressure ulcer of unspecified site, unspecified stage: Secondary | ICD-10-CM | POA: Insufficient documentation

## 2020-07-10 DIAGNOSIS — F17210 Nicotine dependence, cigarettes, uncomplicated: Secondary | ICD-10-CM | POA: Diagnosis present

## 2020-07-10 DIAGNOSIS — G4733 Obstructive sleep apnea (adult) (pediatric): Secondary | ICD-10-CM | POA: Diagnosis present

## 2020-07-10 DIAGNOSIS — R778 Other specified abnormalities of plasma proteins: Secondary | ICD-10-CM | POA: Diagnosis not present

## 2020-07-10 DIAGNOSIS — Z87442 Personal history of urinary calculi: Secondary | ICD-10-CM

## 2020-07-10 DIAGNOSIS — I130 Hypertensive heart and chronic kidney disease with heart failure and stage 1 through stage 4 chronic kidney disease, or unspecified chronic kidney disease: Principal | ICD-10-CM | POA: Diagnosis present

## 2020-07-10 DIAGNOSIS — I4510 Unspecified right bundle-branch block: Secondary | ICD-10-CM | POA: Diagnosis not present

## 2020-07-10 DIAGNOSIS — R0602 Shortness of breath: Secondary | ICD-10-CM | POA: Diagnosis not present

## 2020-07-10 DIAGNOSIS — I21A1 Myocardial infarction type 2: Secondary | ICD-10-CM | POA: Diagnosis not present

## 2020-07-10 DIAGNOSIS — J9801 Acute bronchospasm: Secondary | ICD-10-CM | POA: Diagnosis not present

## 2020-07-10 DIAGNOSIS — I5033 Acute on chronic diastolic (congestive) heart failure: Secondary | ICD-10-CM | POA: Diagnosis present

## 2020-07-10 DIAGNOSIS — N179 Acute kidney failure, unspecified: Secondary | ICD-10-CM | POA: Diagnosis present

## 2020-07-10 DIAGNOSIS — Z820 Family history of epilepsy and other diseases of the nervous system: Secondary | ICD-10-CM | POA: Diagnosis not present

## 2020-07-10 DIAGNOSIS — I509 Heart failure, unspecified: Secondary | ICD-10-CM | POA: Diagnosis not present

## 2020-07-10 DIAGNOSIS — I5032 Chronic diastolic (congestive) heart failure: Secondary | ICD-10-CM | POA: Diagnosis present

## 2020-07-10 DIAGNOSIS — J9621 Acute and chronic respiratory failure with hypoxia: Secondary | ICD-10-CM | POA: Diagnosis present

## 2020-07-10 DIAGNOSIS — J9601 Acute respiratory failure with hypoxia: Secondary | ICD-10-CM | POA: Diagnosis not present

## 2020-07-10 DIAGNOSIS — R0600 Dyspnea, unspecified: Secondary | ICD-10-CM | POA: Diagnosis not present

## 2020-07-10 DIAGNOSIS — Z7982 Long term (current) use of aspirin: Secondary | ICD-10-CM | POA: Diagnosis not present

## 2020-07-10 DIAGNOSIS — R069 Unspecified abnormalities of breathing: Secondary | ICD-10-CM | POA: Diagnosis not present

## 2020-07-10 DIAGNOSIS — J441 Chronic obstructive pulmonary disease with (acute) exacerbation: Secondary | ICD-10-CM | POA: Diagnosis present

## 2020-07-10 DIAGNOSIS — E1122 Type 2 diabetes mellitus with diabetic chronic kidney disease: Secondary | ICD-10-CM | POA: Diagnosis present

## 2020-07-10 DIAGNOSIS — Z79899 Other long term (current) drug therapy: Secondary | ICD-10-CM

## 2020-07-10 LAB — CBC
HCT: 42.1 % (ref 39.0–52.0)
Hemoglobin: 15 g/dL (ref 13.0–17.0)
MCH: 33.3 pg (ref 26.0–34.0)
MCHC: 35.6 g/dL (ref 30.0–36.0)
MCV: 93.3 fL (ref 80.0–100.0)
Platelets: 289 10*3/uL (ref 150–400)
RBC: 4.51 MIL/uL (ref 4.22–5.81)
RDW: 13.7 % (ref 11.5–15.5)
WBC: 12.1 10*3/uL — ABNORMAL HIGH (ref 4.0–10.5)
nRBC: 0 % (ref 0.0–0.2)

## 2020-07-10 LAB — BASIC METABOLIC PANEL
Anion gap: 14 (ref 5–15)
BUN: 34 mg/dL — ABNORMAL HIGH (ref 8–23)
CO2: 27 mmol/L (ref 22–32)
Calcium: 9.6 mg/dL (ref 8.9–10.3)
Chloride: 97 mmol/L — ABNORMAL LOW (ref 98–111)
Creatinine, Ser: 1.58 mg/dL — ABNORMAL HIGH (ref 0.61–1.24)
GFR calc Af Amer: 52 mL/min — ABNORMAL LOW (ref >60)
GFR calc non Af Amer: 45 mL/min — ABNORMAL LOW (ref >60)
Glucose, Bld: 147 mg/dL — ABNORMAL HIGH (ref 70–99)
Potassium: 4.6 mmol/L (ref 3.5–5.1)
Sodium: 138 mmol/L (ref 135–145)

## 2020-07-10 LAB — TROPONIN I (HIGH SENSITIVITY)
Troponin I (High Sensitivity): 173 ng/L (ref <18)
Troponin I (High Sensitivity): 191 ng/L (ref <18)

## 2020-07-10 LAB — PROTIME-INR
INR: 1.2 (ref 0.8–1.2)
Prothrombin Time: 14.3 seconds (ref 11.4–15.2)

## 2020-07-10 LAB — SARS CORONAVIRUS 2 BY RT PCR (HOSPITAL ORDER, PERFORMED IN ~~LOC~~ HOSPITAL LAB): SARS Coronavirus 2: NEGATIVE

## 2020-07-10 LAB — APTT: aPTT: 33 seconds (ref 24–36)

## 2020-07-10 LAB — BRAIN NATRIURETIC PEPTIDE: B Natriuretic Peptide: 421.1 pg/mL — ABNORMAL HIGH (ref 0.0–100.0)

## 2020-07-10 MED ORDER — IPRATROPIUM-ALBUTEROL 0.5-2.5 (3) MG/3ML IN SOLN: 3.0000 mL | Freq: Four times a day (QID) | RESPIRATORY_TRACT | Status: DC | PRN

## 2020-07-10 MED ORDER — HEPARIN (PORCINE) 25000 UT/250ML-% IV SOLN
1950.0000 [IU]/h | INTRAVENOUS | Status: DC
  Administered 2020-07-10 – 2020-07-11 (×2): 1700 [IU]/h via INTRAVENOUS
  Administered 2020-07-11: 1950 [IU]/h via INTRAVENOUS
  Filled 2020-07-10 (×2): qty 250

## 2020-07-10 MED ORDER — FUROSEMIDE 10 MG/ML IJ SOLN
40.0000 mg | Freq: Two times a day (BID) | INTRAMUSCULAR | Status: DC
  Administered 2020-07-11 – 2020-07-14 (×8): 40 mg via INTRAVENOUS
  Filled 2020-07-10 (×8): qty 4

## 2020-07-10 MED ORDER — SODIUM CHLORIDE 0.9% FLUSH
3.0000 mL | Freq: Once | INTRAVENOUS | Status: AC
  Administered 2020-07-12: 3 mL via INTRAVENOUS

## 2020-07-10 MED ORDER — CLOPIDOGREL BISULFATE 75 MG PO TABS
75.0000 mg | ORAL_TABLET | Freq: Every day | ORAL | Status: DC
  Administered 2020-07-11 – 2020-07-18 (×8): 75 mg via ORAL
  Filled 2020-07-10 (×8): qty 1

## 2020-07-10 MED ORDER — ASPIRIN 81 MG PO CHEW
324.0000 mg | CHEWABLE_TABLET | Freq: Once | ORAL | Status: AC
  Administered 2020-07-10: 324 mg via ORAL
  Filled 2020-07-10: qty 4

## 2020-07-10 MED ORDER — ASPIRIN EC 81 MG PO TBEC
81.0000 mg | DELAYED_RELEASE_TABLET | Freq: Every day | ORAL | Status: DC
  Administered 2020-07-11 – 2020-07-18 (×8): 81 mg via ORAL
  Filled 2020-07-10 (×8): qty 1

## 2020-07-10 MED ORDER — METOPROLOL SUCCINATE ER 50 MG PO TB24
50.0000 mg | ORAL_TABLET | Freq: Every day | ORAL | Status: DC
  Administered 2020-07-12 – 2020-07-18 (×7): 50 mg via ORAL
  Filled 2020-07-10 (×7): qty 1

## 2020-07-10 MED ORDER — NICOTINE 21 MG/24HR TD PT24
21.0000 mg | MEDICATED_PATCH | Freq: Every day | TRANSDERMAL | Status: DC
  Administered 2020-07-11 – 2020-07-18 (×9): 21 mg via TRANSDERMAL
  Filled 2020-07-10 (×9): qty 1

## 2020-07-10 MED ORDER — FAMOTIDINE 20 MG PO TABS
20.0000 mg | ORAL_TABLET | Freq: Two times a day (BID) | ORAL | Status: DC
  Administered 2020-07-11 – 2020-07-18 (×16): 20 mg via ORAL
  Filled 2020-07-10 (×16): qty 1

## 2020-07-10 MED ORDER — HEPARIN BOLUS VIA INFUSION
4000.0000 [IU] | Freq: Once | INTRAVENOUS | Status: AC
  Administered 2020-07-10: 4000 [IU] via INTRAVENOUS
  Filled 2020-07-10: qty 4000

## 2020-07-10 MED ORDER — LISINOPRIL 10 MG PO TABS: 20.0000 mg | ORAL_TABLET | Freq: Every day | ORAL | Status: DC

## 2020-07-10 MED ORDER — CEPHALEXIN 500 MG PO CAPS
500.0000 mg | ORAL_CAPSULE | Freq: Four times a day (QID) | ORAL | Status: AC
  Administered 2020-07-11 – 2020-07-12 (×9): 500 mg via ORAL
  Filled 2020-07-10 (×9): qty 1

## 2020-07-10 MED ORDER — ROSUVASTATIN CALCIUM 10 MG PO TABS
20.0000 mg | ORAL_TABLET | Freq: Every day | ORAL | Status: DC
  Administered 2020-07-11 – 2020-07-17 (×6): 20 mg via ORAL
  Filled 2020-07-10 (×5): qty 2
  Filled 2020-07-10: qty 1
  Filled 2020-07-10: qty 2

## 2020-07-10 MED ORDER — ACETAMINOPHEN 325 MG PO TABS: 650.0000 mg | ORAL_TABLET | ORAL | Status: DC | PRN

## 2020-07-10 MED ORDER — NITROGLYCERIN 0.4 MG SL SUBL: 0.4000 mg | SUBLINGUAL_TABLET | SUBLINGUAL | Status: DC | PRN

## 2020-07-10 MED ORDER — ONDANSETRON HCL 4 MG/2ML IJ SOLN: 4.0000 mg | Freq: Four times a day (QID) | INTRAMUSCULAR | Status: DC | PRN

## 2020-07-10 NOTE — ED Triage Notes (Signed)
Pt presents from home via acems with c/o shortness of breath. Pt recently diagnosed with CHF and has not been to cardiology yet. Pt noted to have bilateral pitting and weeping edema to lower extremities. Pt with exertional shortness of breath.

## 2020-07-10 NOTE — ED Notes (Addendum)
Pt placed on 10L HFNC from nonrebreather, okay per MD Derrill Kay and MD Opyd.

## 2020-07-10 NOTE — Progress Notes (Signed)
ANTICOAGULATION CONSULT NOTE - Initial Consult  Pharmacy Consult for Heparin  Indication: chest pain/ACS  Allergies  Allergen Reactions  . Morphine Anxiety and Other (See Comments)  . Morphine And Related Anxiety    Patient Measurements: Height: 6\' 1"  (185.4 cm) Weight: (!) 195 kg (430 lb) IBW/kg (Calculated) : 79.9 Heparin Dosing Weight: 128.4 kg   Vital Signs: Temp: 98 F (36.7 C) (08/01 1823) Temp Source: Axillary (08/01 1823) BP: 101/69 (08/01 1823) Pulse Rate: 98 (08/01 1823)  Labs: Recent Labs    07/10/20 1822  HGB 15.0  HCT 42.1  PLT 289  CREATININE 1.58*  TROPONINIHS 173*    Estimated Creatinine Clearance: 80.8 mL/min (A) (by C-G formula based on SCr of 1.58 mg/dL (H)).   Medical History: Past Medical History:  Diagnosis Date  . Arthritis   . COPD (chronic obstructive pulmonary disease) (HCC)   . Diabetes (HCC)   . Hyperlipidemia   . Hypertension   . Kidney stone   . Obesity   . Tachycardia   . TIA (transient ischemic attack)     Medications:  (Not in a hospital admission)   Assessment: Pharmacy consulted to dose heparin in this 69 year old male admitted with ACS/NSTEMI.  No prior anticoag noted. CrCl = 80.8 ml/min  Goal of Therapy:  Heparin level 0.3-0.7 units/ml Monitor platelets by anticoagulation protocol: Yes   Plan:  Give 4000 units bolus x 1 Start heparin infusion at 1700 units/hr Check anti-Xa level in 6 hours and daily while on heparin Continue to monitor H&H and platelets  Chizara Mena D 07/10/2020,8:27 PM

## 2020-07-10 NOTE — H&P (Addendum)
History and Physical    Jihad Brownlow Hauswirth GUY:[id number] DOB: 1951-02-01 DOA: 07/10/2020  PCP: [facility]   Patient coming from: Home   Chief Complaint: SOB   HPI: Declan Mier is a 69 y.o. male with medical history significant for coronary artery disease with stents, BMI 57, COPD, tobacco abuse, prediabetes, chronic kidney disease 3a, and chronic diastolic CHF, now presenting to the emergency department for evaluation of shortness of breath.  Patient reports that he was recently seen for increased swelling and redness in the lower extremities, particularly on the right where there was a wound.  He was started on Keflex for that and has had marked improvement, but then became short of breath yesterday evening.  His dyspnea continued throughout the night and persisting today, prompting his presentation to the ED.  He denies any fevers or chills, has not been coughing much, and denies any chest pain or chest discomfort associated with this.  He denies nausea or diaphoresis.  Patient reports that when he had an MI in 2019 he had intense chest pressure and left arm discomfort, but no similar symptoms since.  Patient was brought in by EMS on nonrebreather.  ED Course: Upon arrival to the ED, patient is found to be afebrile, mildly tachypneic, saturating in the 90s on 2 L/min of supplemental oxygen, and with blood pressure 100/70.  EKG features a sinus rhythm with nonspecific IVCD.  Chest x-ray is concerning for vascular congestion and increased interstitial markings.  Chemistry panel notable for creatinine 1.58 which is similar to prior.  CBC with mild leukocytosis.  High-sensitivity troponin is 173.  Patient was given 3 and 24 mg of aspirin and started on IV heparin in the ED.  Second troponin, BNP, and COVID-19 PCR are all pending at this time.  Review of Systems:  All other systems reviewed and apart from HPI, are negative.  Past Medical History:  Diagnosis Date  .  Arthritis   . COPD (chronic obstructive pulmonary disease) (HCC)   . Diabetes (HCC)   . Hyperlipidemia   . Hypertension   . Kidney stone   . Obesity   . Tachycardia   . TIA (transient ischemic attack)     Past Surgical History:  Procedure Laterality Date  . CORONARY/GRAFT ACUTE MI REVASCULARIZATION N/A 12/08/2018   Procedure: Coronary/Graft Acute MI Revascularization;  Surgeon: Alwyn Pea, MD;  Location: [facility] INVASIVE CV LAB;  Service: Cardiovascular;  Laterality: N/A;  . KIDNEY STONE SURGERY    . left arm surgery  1963  . LEFT HEART CATH AND CORONARY ANGIOGRAPHY N/A 12/08/2018   Procedure: LEFT HEART CATH AND CORONARY ANGIOGRAPHY;  Surgeon: Alwyn Pea, MD;  Location: [facility] INVASIVE CV LAB;  Service: Cardiovascular;  Laterality: N/A;    Social History:   reports that he has been smoking cigarettes. He has been smoking about 0.50 packs per day. He has never used smokeless tobacco. He reports that he does not drink alcohol and does not use drugs.  Allergies  Allergen Reactions  . Morphine Anxiety and Other (See Comments)  . Morphine And Related Anxiety    Family History  Problem Relation Age of Onset  . Alzheimer's disease Mother   . CAD Father      Prior to Admission medications   Medication Sig Start Date End Date Taking? Authorizing Provider  aspirin 81 MG chewable tablet Chew 1 tablet (81 mg total) by mouth daily. 06/03/19   Carlean Jews, NP  cephALEXin (KEFLEX) 500  MG capsule Take 1 capsule (500 mg total) by mouth 4 (four) times daily for 10 days. 07/02/20 07/12/20  Emily Filbert, MD  clopidogrel (PLAVIX) 75 MG tablet Take 1 tablet (75 mg total) by mouth daily. 01/08/20   Carlean Jews, NP  famotidine (PEPCID) 20 MG tablet Take 1 tablet (20 mg total) by mouth 2 (two) times daily. 03/09/20   Carlean Jews, NP  furosemide (LASIX) 40 MG tablet Take 1 tablet (40 mg total) by mouth daily. 05/28/20 06/27/20  Charise Killian, MD  glucose blood  test strip 1 each by Other route as needed for other. Use as instructed    [provider]  Providence Lanius (OMEGA-3) 500 MG CAPS Take 1 tab po daily 06/04/19   Carlean Jews, NP  lisinopril-hydrochlorothiazide (ZESTORETIC) 20-12.5 MG tablet Take 1 tablet by mouth daily. 02/08/20   Carlean Jews, NP  metoprolol succinate (TOPROL-XL) 50 MG 24 hr tablet Take 1 tablet (50 mg total) by mouth daily. 02/08/20   Carlean Jews, NP  ONETOUCH DELICA LANCETS 33G MISC by Does not apply route.    [provider]  potassium chloride (KLOR-CON) 10 MEQ tablet Take 1 tablet (10 mEq total) by mouth daily. 05/28/20 06/27/20  Charise Killian, MD  rosuvastatin (CRESTOR) 20 MG tablet Take 1 tablet (20 mg total) by mouth daily. 05/16/20   Carlean Jews, NP  triamcinolone cream (KENALOG) 0.5 % Apply 1 application topically 2 (two) times daily.    [provider]  VENTOLIN HFA 108 (90 Base) MCG/ACT inhaler Inhale 2 puffs into the lungs every 6 (six) hours as needed for wheezing or shortness of breath. 06/27/20   Carlean Jews, NP    Physical Exam: Vitals:   07/10/20 1820 07/10/20 1823  BP:  101/69  Pulse:  98  Resp:  (!) 26  Temp:  98 F (36.7 C)  TempSrc:  Axillary  SpO2:  91%  Weight: (!) 195 kg   Height: 6\' 1"  (1.854 m)     Constitutional: NAD, calm  Eyes: PERTLA, lids and conjunctivae normal ENMT: Mucous membranes are moist. Posterior pharynx clear of any exudate or lesions.   Neck: normal, supple, no masses, no thyromegaly Respiratory: Mild tachypnea, speaking full sentences, fine rales, occasional expiratory wheeze. No pallor or cyanosis.  Cardiovascular: S1 & S2 heard, regular rate and rhythm. Pitting edema involving bilateral LEs.  Abdomen: No distension, no tenderness, soft. Bowel sounds active.  Musculoskeletal: no clubbing / cyanosis. No joint deformity upper and lower extremities.   Skin: Faint erythema and serous weeping involving distal RLE without fluctuance  or purulent drainage. Warm, dry, well-perfused. Neurologic: CN 2-12 grossly intact. Sensation intact. Moving all extremities.  Psychiatric: Alert and oriented to person, place, and situation. Very pleasant and cooperative.    Labs and Imaging on Admission: I have personally reviewed following labs and imaging studies  CBC: Recent Labs  Lab 07/10/20 1822  WBC 12.1*  HGB 15.0  HCT 42.1  MCV 93.3  PLT 289   Basic Metabolic Panel: Recent Labs  Lab 07/10/20 1822  NA 138  K 4.6  CL 97*  CO2 27  GLUCOSE 147*  BUN 34*  CREATININE 1.58*  CALCIUM 9.6   GFR: Estimated Creatinine Clearance: 80.8 mL/min (A) (by C-G formula based on SCr of 1.58 mg/dL (H)). Liver Function Tests: No results for input(s): AST, ALT, ALKPHOS, BILITOT, PROT, ALBUMIN in the last 168 hours. No results for input(s): LIPASE, AMYLASE in the  last 168 hours. No results for input(s): AMMONIA in the last 168 hours. Coagulation Profile: No results for input(s): INR, PROTIME in the last 168 hours. Cardiac Enzymes: No results for input(s): CKTOTAL, CKMB, CKMBINDEX, TROPONINI in the last 168 hours. BNP (last 3 results) No results for input(s): PROBNP in the last 8760 hours. HbA1C: No results for input(s): HGBA1C in the last 72 hours. CBG: No results for input(s): GLUCAP in the last 168 hours. Lipid Profile: No results for input(s): CHOL, HDL, LDLCALC, TRIG, CHOLHDL, LDLDIRECT in the last 72 hours. Thyroid Function Tests: No results for input(s): TSH, T4TOTAL, FREET4, T3FREE, THYROIDAB in the last 72 hours. Anemia Panel: No results for input(s): VITAMINB12, FOLATE, FERRITIN, TIBC, IRON, RETICCTPCT in the last 72 hours. Urine analysis:    Component Value Date/Time   COLORURINE Yellow 06/10/2013 1212   APPEARANCEUR Clear 06/10/2013 1212   LABSPEC 1.019 06/10/2013 1212   PHURINE 5.0 06/10/2013 1212   GLUCOSEU Negative 06/10/2013 1212   HGBUR 1+ 06/10/2013 1212   BILIRUBINUR Negative 06/10/2013 1212    KETONESUR Negative 06/10/2013 1212   PROTEINUR Negative 06/10/2013 1212   NITRITE Negative 06/10/2013 1212   LEUKOCYTESUR Negative 06/10/2013 1212   Sepsis Labs: @LABRCNTIP (procalcitonin:4,lacticidven:4) ) Recent Results (from the past 240 hour(s))  Blood Culture (routine x 2)     Status: None   Collection Time: 07/02/20 11:29 AM   Specimen: BLOOD  Result Value Ref Range Status   Specimen Description BLOOD LFA  Final   Special Requests   Final    BOTTLES DRAWN AEROBIC AND ANAEROBIC Blood Culture adequate volume   Culture   Final    NO GROWTH 5 DAYS Performed at [facility], [address] [place], [place] [address]    Report Status 07/07/2020 FINAL  Final  Blood Culture (routine x 2)     Status: None   Collection Time: 07/02/20 12:10 PM   Specimen: BLOOD  Result Value Ref Range Status   Specimen Description BLOOD RH  Final   Special Requests   Final    BOTTLES DRAWN AEROBIC AND ANAEROBIC Blood Culture results may not be optimal due to an excessive volume of blood received in culture bottles   Culture   Final    NO GROWTH 5 DAYS Performed at [facility], [address] [place], [place] [address]    Report Status 07/07/2020 FINAL  Final     Radiological Exams on Admission: DG Chest Port 1 View  Result Date: 07/10/2020 CLINICAL DATA:  Shortness of breath, CHF EXAM: PORTABLE CHEST 1 VIEW COMPARISON:  Radiograph 07/02/2020, CT 05/24/2020 FINDINGS: Mild central vascular congestion with diffuse Lea increased interstitial prominence similar to comparison studies accounting for differences in technique and inflation. No focally consolidative opacity. No pneumothorax or effusion. Cardiac silhouette is likely accentuated by portable technique. Overall, cardiomediastinal contours are similar to prior. No acute osseous or soft tissue abnormality. Telemetry leads overlie the chest. IMPRESSION: Vascular congestion with some increased interstitial markings, may reflect  early interstitial edema/features of CHF. Electronically Signed   By: Kreg ShropshirePrice  DeHay M.D.   On: 07/10/2020 19:18    EKG: Independently reviewed. Sinus rhythm, non-specific IVCD. Rate slower but no other significant change from prior.   Assessment/Plan   1. Acute on chronic diastolic CHF; acute hypoxic respiratory failure  - Presents with SOB, has significant peripheral edema with serous weeping, CXR findings consistent with CHF, and new supplemental O2 requirement  - EF was 50-55% in June 2021  - Diurese with  Lasix 40 mg IV q12h, continue beta-blocker and ACE as BP allows, follow daily weight and I/Os    2. ?Non-STEMI  - Presents with one day of SOB, found to have new supplemental O2 requirement and troponin elevated to 173 without chest discomfort, nausea, diaphoresis, or appreciable change in EKG (other than slower rate)  - He was given ASA 324 mg in ED and started on IV heparin  - Likely a type II scenario, will continue cardiac monitoring, repeat troponin and EKG, continue ASA, Plavix, and statin, hold beta-blocker for now given some low BP readings in ED  - PE was considered, but less likely given no chest pain or cough, and near-resolution of lower RLE redness/tenderness with Keflex   3. COPD  - There is occasional wheeze on admission, patient denies recent cough - Acute respiratory sxs primarily secondary to CHF  - Nebs as needed    4. CKD IIIa  - SCr is 1.58 on admission, similar to priors  - Renally-dose medications, monitor   5. RLE cellulitis, present on admission  - Patient reports marked improvement with Keflex, will continue with end-date 8/3  - He has leukocytosis, tachypnea (likely from CHF), but no fever and is not septic   6. Tobacco abuse  - Patient states he quit yesterday, requests nicotine patch to help him     DVT prophylaxis: IV heparin  Code Status: Full  Family Communication: Discussed with patient  Disposition Plan:  Patient is from: Home  Anticipated  d/c is to: TBD, SNF was recommended in June but patient elected to go home Anticipated d/c date is: 2-3 days  Patient currently: Has new hypoxia, possible NSTEMI   Consults called: None  Admission status: Inpatient     Briscoe Deutscher, MD [facility]  07/10/2020, 8:52 PM

## 2020-07-10 NOTE — ED Provider Notes (Signed)
[facility] Emergency Department Provider Note   ____________________________________________   I have reviewed the triage vital signs and the nursing notes.   HISTORY  Chief Complaint Shortness of Breath   History limited by: Not Limited   HPI Thomas Tanner is a 69 y.o. male who presents to the emergency department today with primary complaint for shortness of breath.  Patient states that his shortness of breath started yesterday.  States it did start somewhat suddenly.  It then continued throughout the day today.  He denies any associated chest pain.  Does have a history of COPD but says he never had an exacerbation in the past.  He is also had recent diagnosis of heart failure but says his edema has improved.  He denies any fevers.  He does feel better after being placed on supplemental oxygen.   Records reviewed. Per medical record review patient has a history of COPD, CHF.   Past Medical History:  Diagnosis Date  . Arthritis   . COPD (chronic obstructive pulmonary disease) (HCC)   . Diabetes (HCC)   . Hyperlipidemia   . Hypertension   . Kidney stone   . Obesity   . Tachycardia   . TIA (transient ischemic attack)     Patient Active Problem List   Diagnosis Date Noted  . CHF (congestive heart failure) (HCC) 05/24/2020  . Anasarca   . AKI (acute kidney injury) (HCC)   . Hyperlipidemia   . Tobacco abuse   . Shortness of breath 04/28/2020  . Upper respiratory tract infection due to COVID-19 virus 04/28/2020  . Edema 04/28/2020  . Encounter for general adult medical examination with abnormal findings 01/15/2019  . Acute non-recurrent maxillary sinusitis 01/15/2019  . Gastroesophageal reflux disease without esophagitis 01/15/2019  . CAD in native artery 12/16/2018  . Morbid obesity with BMI of 50.0-59.9, adult (HCC) 12/16/2018  . Moderate cigarette smoker 12/16/2018  . ST elevation myocardial infarction (STEMI) (HCC) 12/08/2018  .  Chronic pain of right knee 08/04/2018  . Mild intermittent asthma 08/04/2018  . Impaired fasting glucose 04/03/2018  . Low back pain with right-sided sciatica 03/05/2018  . Chronic right-sided low back pain with right-sided sciatica 03/05/2018  . Pain in right hip 03/05/2018  . Hypertension 03/05/2018  . Foreign body in bladder and urethra 06/09/2013  . Kidney stone 06/09/2013  . Ureteric stone 06/09/2013  . Kidney stone 06/09/2013    Past Surgical History:  Procedure Laterality Date  . CORONARY/GRAFT ACUTE MI REVASCULARIZATION N/A 12/08/2018   Procedure: Coronary/Graft Acute MI Revascularization;  Surgeon: Alwyn Pea, MD;  Location: [facility] INVASIVE CV LAB;  Service: Cardiovascular;  Laterality: N/A;  . KIDNEY STONE SURGERY    . left arm surgery  1963  . LEFT HEART CATH AND CORONARY ANGIOGRAPHY N/A 12/08/2018   Procedure: LEFT HEART CATH AND CORONARY ANGIOGRAPHY;  Surgeon: Alwyn Pea, MD;  Location: [facility] INVASIVE CV LAB;  Service: Cardiovascular;  Laterality: N/A;    Prior to Admission medications   Medication Sig Start Date End Date Taking? Authorizing Provider  aspirin 81 MG chewable tablet Chew 1 tablet (81 mg total) by mouth daily. 06/03/19   Carlean Jews, NP  cephALEXin (KEFLEX) 500 MG capsule Take 1 capsule (500 mg total) by mouth 4 (four) times daily for 10 days. 07/02/20 07/12/20  Emily Filbert, MD  clopidogrel (PLAVIX) 75 MG tablet Take 1 tablet (75 mg total) by mouth daily. 01/08/20   Carlean Jews, NP  famotidine (PEPCID)  20 MG tablet Take 1 tablet (20 mg total) by mouth 2 (two) times daily. 03/09/20   Carlean Jews, NP  furosemide (LASIX) 40 MG tablet Take 1 tablet (40 mg total) by mouth daily. 05/28/20 06/27/20  Charise Killian, MD  glucose blood test strip 1 each by Other route as needed for other. Use as instructed    [provider]  Providence Lanius (OMEGA-3) 500 MG CAPS Take 1 tab po daily 06/04/19   Carlean Jews, NP   lisinopril-hydrochlorothiazide (ZESTORETIC) 20-12.5 MG tablet Take 1 tablet by mouth daily. 02/08/20   Carlean Jews, NP  metoprolol succinate (TOPROL-XL) 50 MG 24 hr tablet Take 1 tablet (50 mg total) by mouth daily. 02/08/20   Carlean Jews, NP  ONETOUCH DELICA LANCETS 33G MISC by Does not apply route.    [provider]  potassium chloride (KLOR-CON) 10 MEQ tablet Take 1 tablet (10 mEq total) by mouth daily. 05/28/20 06/27/20  Charise Killian, MD  rosuvastatin (CRESTOR) 20 MG tablet Take 1 tablet (20 mg total) by mouth daily. 05/16/20   Carlean Jews, NP  triamcinolone cream (KENALOG) 0.5 % Apply 1 application topically 2 (two) times daily.    [provider]  VENTOLIN HFA 108 (90 Base) MCG/ACT inhaler Inhale 2 puffs into the lungs every 6 (six) hours as needed for wheezing or shortness of breath. 06/27/20   Carlean Jews, NP    Allergies Morphine and Morphine and related  Family History  Problem Relation Age of Onset  . Alzheimer's disease Mother   . CAD Father     Social History Social History   Tobacco Use  . Smoking status: Current Every Day Smoker    Packs/day: 0.50    Types: Cigarettes  . Smokeless tobacco: Never Used  . Tobacco comment: pt has cut back on amount and currently using the patch to assist with quiting  Substance Use Topics  . Alcohol use: No  . Drug use: No    Review of Systems Constitutional: No fever/chills Eyes: No visual changes. ENT: No sore throat. Cardiovascular: Denies chest pain. Respiratory: Positive shortness of breath. Gastrointestinal: No abdominal pain.  No nausea, no vomiting.  No diarrhea.   Genitourinary: Negative for dysuria. Musculoskeletal: Negative for back pain. Skin: Negative for rash. Neurological: Negative for headaches, focal weakness or numbness.  ____________________________________________   PHYSICAL EXAM:  VITAL SIGNS: ED Triage Vitals  Enc Vitals Group     BP 07/10/20 1823 101/69      Pulse Rate 07/10/20 1823 98     Resp 07/10/20 1823 (!) 26     Temp 07/10/20 1823 98 F (36.7 C)     Temp Source 07/10/20 1823 Axillary     SpO2 07/10/20 1823 91 %     Weight 07/10/20 1820 (!) 430 lb (195 kg)     Height 07/10/20 1820 6\' 1"  (1.854 m)   Constitutional: Alert and oriented.  Eyes: Conjunctivae are normal.  ENT      Head: Normocephalic and atraumatic.      Nose: No congestion/rhinnorhea.      Mouth/Throat: Mucous membranes are moist.      Neck: No stridor. Hematological/Lymphatic/Immunilogical: No cervical lymphadenopathy. Cardiovascular: Normal rate, regular rhythm.   Respiratory: Increased respiratory effort. No crackles or wheezes appreciated but exam is limited secondary to body habitus.  Gastrointestinal: Soft and non tender. No rebound. No guarding.  Genitourinary: Deferred Musculoskeletal: Normal range of motion in all extremities.  Neurologic:  Normal  speech and language. No gross focal neurologic deficits are appreciated.  Skin:  Skin is warm, dry and intact. No rash noted. Psychiatric: Mood and affect are normal. Speech and behavior are normal. Patient exhibits appropriate insight and judgment.  ____________________________________________    LABS (pertinent positives/negatives)  CBC wbc 12.1, hgb 15.0, plt 289 Trop hs 173 BMP na 138, k 4.6, glu 147, cr 1.58 BNP 421.1 ____________________________________________   EKG  I, Phineas Semen, attending physician, personally viewed and interpreted this EKG  EKG Time: 1832 Rate: 99 Rhythm: sinus rhythm Axis: normal Intervals: qtc 461 QRS: nonspecific ivcd ST changes: no st elevation Impression: abnormal ekg  ____________________________________________    RADIOLOGY  CXR Concern for early edema  ____________________________________________   PROCEDURES  Procedures  CRITICAL CARE Performed by: Phineas Semen   Total critical care time: 30 minutes  Critical care time was  exclusive of separately billable procedures and treating other patients.  Critical care was necessary to treat or prevent imminent or life-threatening deterioration.  Critical care was time spent personally by me on the following activities: development of treatment plan with patient and/or surrogate as well as nursing, discussions with consultants, evaluation of patient's response to treatment, examination of patient, obtaining history from patient or surrogate, ordering and performing treatments and interventions, ordering and review of laboratory studies, ordering and review of radiographic studies, pulse oximetry and re-evaluation of patient's condition.  ____________________________________________   INITIAL IMPRESSION / ASSESSMENT AND PLAN / ED COURSE  Pertinent labs & imaging results that were available during my care of the patient were reviewed by me and considered in my medical decision making (see chart for details).   Patient presented to the emergency department today because of concerns for shortness of breath.  Patient states that it started somewhat suddenly yesterday.  It persisted throughout the day today.  On exam patient is in some respiratory distress with increased work of breathing.  Initially placed on nonrebreather by EMS will try to wean him to nasal cannula.  Patient's chest x-ray shows possibly some early edema.  Blood work however was notable for elevated troponin.  This is a significant elevation of her troponin obtained roughly 1 week ago.  Patient does have a history of coronary artery disease status post stent placement.  Because of this will start patient on heparin and give aspirin.  Discussed findings with patient.  Will plan admission.  ____________________________________________   FINAL CLINICAL IMPRESSION(S) / ED DIAGNOSES  Final diagnoses:  SOB (shortness of breath)  Elevated troponin     Note: This dictation was prepared with Dragon dictation. Any  transcriptional errors that result from this process are unintentional     Phineas Semen, MD 07/10/20 2034

## 2020-07-11 LAB — CBC
HCT: 38.5 % — ABNORMAL LOW (ref 39.0–52.0)
Hemoglobin: 13.8 g/dL (ref 13.0–17.0)
MCH: 34 pg (ref 26.0–34.0)
MCHC: 35.8 g/dL (ref 30.0–36.0)
MCV: 94.8 fL (ref 80.0–100.0)
Platelets: 246 10*3/uL (ref 150–400)
RBC: 4.06 MIL/uL — ABNORMAL LOW (ref 4.22–5.81)
RDW: 13.6 % (ref 11.5–15.5)
WBC: 10.2 10*3/uL (ref 4.0–10.5)
nRBC: 0 % (ref 0.0–0.2)

## 2020-07-11 LAB — BASIC METABOLIC PANEL
Anion gap: 10 (ref 5–15)
BUN: 35 mg/dL — ABNORMAL HIGH (ref 8–23)
CO2: 26 mmol/L (ref 22–32)
Calcium: 8.6 mg/dL — ABNORMAL LOW (ref 8.9–10.3)
Chloride: 99 mmol/L (ref 98–111)
Creatinine, Ser: 1.61 mg/dL — ABNORMAL HIGH (ref 0.61–1.24)
GFR calc Af Amer: 51 mL/min — ABNORMAL LOW (ref >60)
GFR calc non Af Amer: 44 mL/min — ABNORMAL LOW (ref >60)
Glucose, Bld: 102 mg/dL — ABNORMAL HIGH (ref 70–99)
Potassium: 4.8 mmol/L (ref 3.5–5.1)
Sodium: 135 mmol/L (ref 135–145)

## 2020-07-11 LAB — MAGNESIUM: Magnesium: 2.1 mg/dL (ref 1.7–2.4)

## 2020-07-11 LAB — HEPARIN LEVEL (UNFRACTIONATED): Heparin Unfractionated: 0.15 IU/mL — ABNORMAL LOW (ref 0.30–0.70)

## 2020-07-11 LAB — TROPONIN I (HIGH SENSITIVITY): Troponin I (High Sensitivity): 124 ng/L (ref <18)

## 2020-07-11 LAB — BRAIN NATRIURETIC PEPTIDE: B Natriuretic Peptide: 247.1 pg/mL — ABNORMAL HIGH (ref 0.0–100.0)

## 2020-07-11 MED ORDER — DM-GUAIFENESIN ER 30-600 MG PO TB12
1.0000 | ORAL_TABLET | Freq: Two times a day (BID) | ORAL | Status: DC
  Administered 2020-07-11 – 2020-07-18 (×15): 1 via ORAL
  Filled 2020-07-11 (×15): qty 1

## 2020-07-11 MED ORDER — IPRATROPIUM-ALBUTEROL 0.5-2.5 (3) MG/3ML IN SOLN: 3.0000 mL | RESPIRATORY_TRACT | Status: DC | PRN

## 2020-07-11 MED ORDER — METHYLPREDNISOLONE SODIUM SUCC 40 MG IJ SOLR
40.0000 mg | Freq: Two times a day (BID) | INTRAMUSCULAR | Status: DC
  Administered 2020-07-11 – 2020-07-16 (×11): 40 mg via INTRAVENOUS
  Filled 2020-07-11 (×11): qty 1

## 2020-07-11 MED ORDER — HEPARIN BOLUS VIA INFUSION
3800.0000 [IU] | Freq: Once | INTRAVENOUS | Status: AC
  Administered 2020-07-11: 3800 [IU] via INTRAVENOUS
  Filled 2020-07-11: qty 3800

## 2020-07-11 MED ORDER — IPRATROPIUM-ALBUTEROL 0.5-2.5 (3) MG/3ML IN SOLN
3.0000 mL | Freq: Four times a day (QID) | RESPIRATORY_TRACT | Status: DC
  Administered 2020-07-11 – 2020-07-12 (×6): 3 mL via RESPIRATORY_TRACT
  Filled 2020-07-11 (×6): qty 3

## 2020-07-11 MED ORDER — ALBUTEROL SULFATE (2.5 MG/3ML) 0.083% IN NEBU
2.5000 mg | INHALATION_SOLUTION | RESPIRATORY_TRACT | Status: DC | PRN
  Filled 2020-07-11: qty 3

## 2020-07-11 MED ORDER — ENOXAPARIN SODIUM 40 MG/0.4ML ~~LOC~~ SOLN: 40.0000 mg | SUBCUTANEOUS | Status: DC

## 2020-07-11 MED ORDER — ENOXAPARIN SODIUM 40 MG/0.4ML ~~LOC~~ SOLN
40.0000 mg | Freq: Two times a day (BID) | SUBCUTANEOUS | Status: DC
  Administered 2020-07-11 – 2020-07-18 (×14): 40 mg via SUBCUTANEOUS
  Filled 2020-07-11 (×14): qty 0.4

## 2020-07-11 NOTE — Consult Note (Addendum)
Cardiology Consultation Note    Patient ID: Thomas Tanner, MRN: [id number], DOB/AGE: August 15, 1951 69 y.o. Admit date: 07/10/2020   Date of Consult: 07/11/2020 Primary Physician: [facility] Primary Cardiologist: Dr. Juliann Pares  Chief Complaint: sob Reason for Consultation: hypoxia/chf Requesting MD: Dr. Barbie Haggis  HPI: Thomas Tanner is a 69 y.o. male with history  of STEMI December 08, 2018 with diffuse ST elevation probable RCA distribution but also diffuse ST changes unclear of IRA of the actual culprit. He underwent PCI and stent of RCA.  Also has history of morbid obesity, shortness of breath and smoker, DJD, obstructive sleep apnea, diabetes, hypertension, COPD, previous TIA, and hyperlipidemia who was admitted with progressive sob. CXR revealed vascular congestion with increase interitial markings consistent with chf. Troponin mildly elevated. Creatinine was 1.58 similar to 9 days ago. Baseline appears to be 1.3-1.4. BNP is 421. Baseline is 64. EKG sinus rhythm with no ischemia. Currently on asa 81, plavix 75, pepcid 20, lasix 40 iv q 12, metoprolol succ 50 daily, crestor 20. -319 since admission. Still very sob but states he is better than he was prior to admission.  He reports compliant with his medications.  He states he got up to go the bathroom when he acutely became short of breath.  He does not wear oxygen at home.  He denied chest pain or symptoms similar to his STEMI.  He is currently pain-free and able to lay flat on his back however his wheezing. He is 96% on nasal cannula at high flow.  Past Medical History:  Diagnosis Date  . Arthritis   . COPD (chronic obstructive pulmonary disease) (HCC)   . Diabetes (HCC)   . Hyperlipidemia   . Hypertension   . Kidney stone   . Obesity   . Tachycardia   . TIA (transient ischemic attack)       Surgical History:  Past Surgical History:  Procedure Laterality Date  . CORONARY/GRAFT ACUTE MI  REVASCULARIZATION N/A 12/08/2018   Procedure: Coronary/Graft Acute MI Revascularization;  Surgeon: Alwyn Pea, MD;  Location: [facility] INVASIVE CV LAB;  Service: Cardiovascular;  Laterality: N/A;  . KIDNEY STONE SURGERY    . left arm surgery  1963  . LEFT HEART CATH AND CORONARY ANGIOGRAPHY N/A 12/08/2018   Procedure: LEFT HEART CATH AND CORONARY ANGIOGRAPHY;  Surgeon: Alwyn Pea, MD;  Location: [facility] INVASIVE CV LAB;  Service: Cardiovascular;  Laterality: N/A;     Home Meds: Prior to Admission medications   Medication Sig Start Date End Date Taking? Authorizing Provider  aspirin 81 MG chewable tablet Chew 1 tablet (81 mg total) by mouth daily. 06/03/19  Yes Boscia, Heather E, NP  cephALEXin (KEFLEX) 500 MG capsule Take 1 capsule (500 mg total) by mouth 4 (four) times daily for 10 days. 07/02/20 07/12/20 Yes Emily Filbert, MD  clopidogrel (PLAVIX) 75 MG tablet Take 1 tablet (75 mg total) by mouth daily. 01/08/20  Yes Boscia, Kathlynn Grate, NP  famotidine (PEPCID) 20 MG tablet Take 1 tablet (20 mg total) by mouth 2 (two) times daily. Patient taking differently: Take 20 mg by mouth daily.  03/09/20  Yes Carlean Jews, NP  Krill Oil (OMEGA-3) 500 MG CAPS Take 1 tab po daily 06/04/19  Yes Boscia, Heather E, NP  lisinopril-hydrochlorothiazide (ZESTORETIC) 20-12.5 MG tablet Take 1 tablet by mouth daily. 02/08/20  Yes Boscia, Kathlynn Grate, NP  meloxicam (MOBIC) 7.5 MG tablet Take 7.5 mg by mouth 2 (two) times daily.  06/26/20  Yes [provider]  metoprolol succinate (TOPROL-XL) 50 MG 24 hr tablet Take 1 tablet (50 mg total) by mouth daily. 02/08/20  Yes Boscia, Kathlynn Grate, NP  ONETOUCH DELICA LANCETS 33G MISC by Does not apply route.   Yes [provider]  rosuvastatin (CRESTOR) 20 MG tablet Take 1 tablet (20 mg total) by mouth daily. 05/16/20  Yes Boscia, Heather E, NP  triamcinolone cream (KENALOG) 0.5 % Apply 1 application topically 2 (two) times daily.   Yes [provider]  VENTOLIN HFA 108 (90 Base) MCG/ACT inhaler Inhale 2 puffs into the lungs every 6 (six) hours as needed for wheezing or shortness of breath. 06/27/20  Yes Boscia, Kathlynn Grate, NP  furosemide (LASIX) 40 MG tablet Take 1 tablet (40 mg total) by mouth daily. 05/28/20 06/27/20  Charise Killian, MD  glucose blood test strip 1 each by Other route as needed for other. Use as instructed    [provider]  potassium chloride (KLOR-CON) 10 MEQ tablet Take 1 tablet (10 mEq total) by mouth daily. 05/28/20 06/27/20  Charise Killian, MD    Inpatient Medications:  . aspirin EC  81 mg Oral Daily  . cephALEXin  500 mg Oral QID  . clopidogrel  75 mg Oral Daily  . famotidine  20 mg Oral BID  . furosemide  40 mg Intravenous Q12H  . metoprolol succinate  50 mg Oral Daily  . nicotine  21 mg Transdermal Daily  . rosuvastatin  20 mg Oral Daily  . sodium chloride flush  3 mL Intravenous Once     Allergies:  Allergies  Allergen Reactions  . Morphine Anxiety and Other (See Comments)    Hallucinations  . Morphine And Related Anxiety    Social History   Socioeconomic History  . Marital status: Married    Spouse name: Not on file  . Number of children: Not on file  . Years of education: Not on file  . Highest education level: Not on file  Occupational History  . Not on file  Tobacco Use  . Smoking status: Current Every Day Smoker    Packs/day: 0.50    Types: Cigarettes  . Smokeless tobacco: Never Used  . Tobacco comment: pt has cut back on amount and currently using the patch to assist with quiting  Substance and Sexual Activity  . Alcohol use: No  . Drug use: No  . Sexual activity: Not Currently  Other Topics Concern  . Not on file  Social History Narrative  . Not on file   Social Determinants of Health   Financial Resource Strain:   . Difficulty of Paying Living Expenses:   Food Insecurity:   . Worried About Programme researcher, broadcasting/film/video in the Last Year:   . Engineer, site in the Last Year:   Transportation Needs:   . Freight forwarder (Medical):   Marland Kitchen Lack of Transportation (Non-Medical):   Physical Activity:   . Days of Exercise per Week:   . Minutes of Exercise per Session:   Stress:   . Feeling of Stress :   Social Connections:   . Frequency of Communication with Friends and Family:   . Frequency of Social Gatherings with Friends and Family:   . Attends Religious Services:   . Active Member of Clubs or Organizations:   . Attends Banker Meetings:   Marland Kitchen Marital Status:   Intimate Partner Violence:   . Fear of Current  or Ex-Partner:   . Emotionally Abused:   Marland Kitchen. Physically Abused:   . Sexually Abused:      Family History  Problem Relation Age of Onset  . Alzheimer's disease Mother   . CAD Father      Review of Systems: A 12-system review of systems was performed and is negative except as noted in the HPI.  Labs: No results for input(s): CKTOTAL, CKMB, TROPONINI in the last 72 hours. Lab Results  Component Value Date   WBC 10.2 07/11/2020   HGB 13.8 07/11/2020   HCT 38.5 (L) 07/11/2020   MCV 94.8 07/11/2020   PLT 246 07/11/2020    Recent Labs  Lab 07/11/20 0336  NA 135  K 4.8  CL 99  CO2 26  BUN 35*  CREATININE 1.61*  CALCIUM 8.6*  GLUCOSE 102*   Lab Results  Component Value Date   CHOL 163 12/08/2018   HDL 34 (L) 12/08/2018   LDLCALC 105 (H) 12/08/2018   TRIG 121 12/08/2018   No results found for: DDIMER  Radiology/Studies:  DG Chest Port 1 View  Result Date: 07/10/2020 CLINICAL DATA:  Shortness of breath, CHF EXAM: PORTABLE CHEST 1 VIEW COMPARISON:  Radiograph 07/02/2020, CT 05/24/2020 FINDINGS: Mild central vascular congestion with diffuse Lea increased interstitial prominence similar to comparison studies accounting for differences in technique and inflation. No focally consolidative opacity. No pneumothorax or effusion. Cardiac silhouette is likely accentuated by portable technique. Overall,  cardiomediastinal contours are similar to prior. No acute osseous or soft tissue abnormality. Telemetry leads overlie the chest. IMPRESSION: Vascular congestion with some increased interstitial markings, may reflect early interstitial edema/features of CHF. Electronically Signed   By: Kreg ShropshirePrice  DeHay M.D.   On: 07/10/2020 19:18   DG Chest Port 1 View  Result Date: 07/02/2020 CLINICAL DATA:  Fever.  Right leg/foot wounds 1 week. EXAM: PORTABLE CHEST 1 VIEW COMPARISON:  05/24/2020 and 12/08/2018 FINDINGS: Lordotic technique is demonstrated. Lungs are adequately inflated with minimal chronic prominence of the perihilar markings. No focal airspace consolidation or effusion. Cardiomediastinal silhouette and remainder the exam is unchanged. IMPRESSION: No acute cardiopulmonary disease Electronically Signed   By: Elberta Fortisaniel  Boyle M.D.   On: 07/02/2020 12:15   DG Foot Complete Right  Result Date: 07/02/2020 CLINICAL DATA:  Right foot pain/ulceration with weeping wounds 1 week. EXAM: RIGHT FOOT COMPLETE - 3+ VIEW COMPARISON:  None. FINDINGS: Moderate soft tissue swelling over the plantar aspect as well as dorsal aspect of the mid to forefoot region. No air in the soft tissues. No acute fracture or dislocation. No bone destruction to suggest osteomyelitis. Mild degenerate changes over the midfoot. Moderate size inferior calcaneal spur. IMPRESSION: Soft tissue swelling over the mid to forefoot. No air in the soft tissues. No evidence of underlying osteomyelitis. Electronically Signed   By: Elberta Fortisaniel  Boyle M.D.   On: 07/02/2020 12:17    Wt Readings from Last 3 Encounters:  07/11/20 (!) 202.8 kg  07/02/20 (!) 195 kg  06/06/20 (!) 201.4 kg    EKG: Sinus rhythm with nonspecific ST-T wave changes.  Physical Exam: Morbidly obese Caucasian male with bronchospasm. Blood pressure (!) 98/62, pulse 90, temperature 97.8 F (36.6 C), temperature source Oral, resp. rate 18, height 6\' 1"  (1.854 m), weight (!) 202.8 kg, SpO2 96  %. Body mass index is 58.97 kg/m. General: Morbid obesity, with some bronchospasm and dyspnea. Head: Normocephalic, atraumatic, sclera non-icteric, no xanthomas, nares are without discharge.  Neck: Negative for carotid bruits. JVD not elevated. Lungs:  Bilateral inspiratory and expiratory wheezes. Heart: RRR with S1 S2. No murmurs, rubs, or gallops appreciated. Abdomen: Difficult to assess due to body habitus.. Msk:  Strength and tone appear normal for age. Extremities: No clubbing or cyanosis. No edema.  Distal pedal pulses are 2+ and equal bilaterally. Neuro: Alert and oriented X 3. No facial asymmetry. No focal deficit. Moves all extremities spontaneously. Psych:  Responds to questions appropriately with a normal affect.     Assessment and Plan   69 y.o. male with history  of STEMI December 08, 2018 with diffuse ST elevation probable RCA distribution but also diffuse ST changes unclear of IRA of the actual culprit. He underwent PCI and stent of RCA.  Also has history of morbid obesity, shortness of breath and smoker, DJD, obstructive sleep apnea, diabetes, hypertension, COPD, previous TIA, and hyperlipidemia who was admitted with progressive sob.  1.  Respiratory distress-likely multifactorial to include bronchospasm due to his COPD as well as pulmonary edema.  Echocardiogram done in June of this year revealed difficult images due to body habitus however his ejection fraction appeared to be 50 to 55%.  There was no wall motion abnormality at that time.  He had LVH with no significant valvular disease.  Pulmonary pressures difficult to appreciate due to body habitus.  These findings were similar to the study done several years before.  He has diuresed some but still is short of breath.  He still has bronchospasm.  Is -319 cc from admission.  Has been on 40 twice daily of Lasix.  Blood pressure relatively soft at 101/56.  Has been on aspirin and Plavix as an outpatient.  Done during previous  admission showed no obvious pulmonary embolus although study was limited by poor timing of contrast bolus.  Given renal insufficiency, repeat study was not done.  No evidence of DVT was noted.  Patient states he is fairly active so pulmonary embolus likely not present although consideration for VQ scan could be raised.  Patient is renal function would limit contrast study at this time.  Will discontinue heparin however since he appears to have ruled out for myocardial infarction.  Elevated troponin appears to be demand continue with inhaled bronchodilators.  2.  Elevated troponin-likely secondary to demand.  Does not appear clinically to have had an acute coronary syndrome event.  This is also not obvious on electrocardiogram.  No evidence of ischemia.  No chest pain.  Would not proceed with functional study at this time given respiratory status.  This can be considered when pulmonary status improves during this hospitalization or as an outpatient.  3.  Congestive heart failure-has HFpEF.  We will continue diuresis.  Has not diuresed much on 40 twice daily of IV Lasix however renal function has diminished slightly from his baseline.  May need to increase diuretics.  Will follow later this morning and if urine output is not improved and symptoms are not improved, will increase to 80 of Lasix.  Will place on heart healthy carb modified diet.  4.  Sleep apnea-we will need CPAP  5.  Morbid obesity-chronic problem.  Weight loss is imperative.  6.  Coronary artery disease-status post STEMI and PCI.  Has diffuse coronary disease.  Has stent in RCA.  Mildly elevated troponin which does not appear to be ischemic.  Will remain on aspirin and Plavix for now and discontinue heparin.  Further recommendations and treatment options will depend on the patient's course   Signed, Dalia Heading MD 07/11/2020, 8:45  AM Pager: [phone]  KAF 12:22 pm 07/11/20 BNP improved to 247, down from 421 on admission. Has  diuresed 525 for a total of 844 since admission. Will remain on lasix at 40 iv bid for 24 hours and follow renal function, electrolytes and symptoms/hemodynaics/bnp.

## 2020-07-11 NOTE — Progress Notes (Addendum)
Update 0210: Pt was admitted on the floor with no signs of distress. Pt alert x 4.Pt on HFNC 10 liter. VSS except BP 93/54 MAP 65. Ascom within pt reach and was educated about safety. Pt requested to do EMMI video and the hand out for the Heart Failure after breakfast. Wound care consult place for pt legs (weeping). Will notify incoming shift. Will continue to monitor.  Pt has a scheduled lasix at 2115. As per the nurse in the ED the lasix was not given but as per pt he did received the lasix in the ED. Pt refused lasix at this time and requested to take it his scheduled morning dose, even with education.   Update 0615: Pt refused wound care at this time. Will notify incoming shift. Will continue to monitor.

## 2020-07-11 NOTE — Progress Notes (Signed)
ANTICOAGULATION CONSULT NOTE - Initial Consult  Pharmacy Consult for Heparin  Indication: chest pain/ACS  Allergies  Allergen Reactions  . Morphine Anxiety and Other (See Comments)    Hallucinations  . Morphine And Related Anxiety    Patient Measurements: Height: 6\' 1"  (185.4 cm) Weight: (!) 202.8 kg (447 lb) IBW/kg (Calculated) : 79.9 Heparin Dosing Weight: 128.4 kg   Vital Signs: Temp: 98.3 F (36.8 C) (08/02 0509) Temp Source: Oral (08/02 0212) BP: 112/57 (08/02 0509) Pulse Rate: 86 (08/02 0509)  Labs: Recent Labs    07/10/20 1822 07/10/20 2050 07/11/20 0336  HGB 15.0  --  13.8  HCT 42.1  --  38.5*  PLT 289  --  246  APTT  --  33  --   LABPROT  --  14.3  --   INR  --  1.2  --   HEPARINUNFRC  --   --  0.15*  CREATININE 1.58*  --  1.61*  TROPONINIHS 173* 191* 124*    Estimated Creatinine Clearance: 81.3 mL/min (A) (by C-G formula based on SCr of 1.61 mg/dL (H)).   Medical History: Past Medical History:  Diagnosis Date  . Arthritis   . COPD (chronic obstructive pulmonary disease) (HCC)   . Diabetes (HCC)   . Hyperlipidemia   . Hypertension   . Kidney stone   . Obesity   . Tachycardia   . TIA (transient ischemic attack)     Medications:  Medications Prior to Admission  Medication Sig Dispense Refill Last Dose  . aspirin 81 MG chewable tablet Chew 1 tablet (81 mg total) by mouth daily. 180 tablet 0 Past Week at Unknown time  . cephALEXin (KEFLEX) 500 MG capsule Take 1 capsule (500 mg total) by mouth 4 (four) times daily for 10 days. 40 capsule 0 Past Week at Unknown time  . clopidogrel (PLAVIX) 75 MG tablet Take 1 tablet (75 mg total) by mouth daily. 90 tablet 1 Past Week at Unknown time  . famotidine (PEPCID) 20 MG tablet Take 1 tablet (20 mg total) by mouth 2 (two) times daily. (Patient taking differently: Take 20 mg by mouth daily. ) 180 tablet 1 Past Week at Unknown time  . Krill Oil (OMEGA-3) 500 MG CAPS Take 1 tab po daily 90 capsule 3 Past Week  at Unknown time  . lisinopril-hydrochlorothiazide (ZESTORETIC) 20-12.5 MG tablet Take 1 tablet by mouth daily. 90 tablet 1 Past Week at Unknown time  . meloxicam (MOBIC) 7.5 MG tablet Take 7.5 mg by mouth 2 (two) times daily.    Past Month at Unknown time  . metoprolol succinate (TOPROL-XL) 50 MG 24 hr tablet Take 1 tablet (50 mg total) by mouth daily. 90 tablet 1 Past Week at Unknown time  . ONETOUCH DELICA LANCETS 33G MISC by Does not apply route.   Past Week at Unknown time  . rosuvastatin (CRESTOR) 20 MG tablet Take 1 tablet (20 mg total) by mouth daily. 90 tablet 1 Past Week at Unknown time  . triamcinolone cream (KENALOG) 0.5 % Apply 1 application topically 2 (two) times daily.   Past Month at Unknown time  . VENTOLIN HFA 108 (90 Base) MCG/ACT inhaler Inhale 2 puffs into the lungs every 6 (six) hours as needed for wheezing or shortness of breath. 48 g 1 prn at prn  . furosemide (LASIX) 40 MG tablet Take 1 tablet (40 mg total) by mouth daily. 30 tablet 0   . glucose blood test strip 1 each by Other route as  needed for other. Use as instructed   prn at prn  . potassium chloride (KLOR-CON) 10 MEQ tablet Take 1 tablet (10 mEq total) by mouth daily. 30 tablet 0     Assessment: Pharmacy consulted to dose heparin in this 69 year old male admitted with ACS/NSTEMI.  No prior anticoag noted. CrCl = 80.8 ml/min  Goal of Therapy:  Heparin level 0.3-0.7 units/ml Monitor platelets by anticoagulation protocol: Yes   Plan:  08/02 @ 0336 HL 0.15 subtherapeutic. Will rebolus heparin 3800 units IV x 1 and increase rate to 1950 units/hr and recheck HL at 1400, CBC trending down, but stable will continue to monitor.  Thomasene Ripple, PharmD, BCPS Clinical Pharmacist 07/11/2020,5:45 AM

## 2020-07-11 NOTE — Consult Note (Signed)
WOC Nurse Consult Note: Reason for Consult:Bilateral LE edema, RLE with cellulitis and weeping Wound type:Venous insufficiency Pressure Injury POA:N/A Measurement:N/A Wound bed:N/A Drainage (amount, consistency, odor) serous weeping Periwound:mild erythema of RLE, bilateral edema Dressing procedure/placement/frequency: Nursing is provided with guidance for topical care of LEs using a daily cleanse, rinse and application of an antimicrobial and astringent dressing (Xeroform) over areas of weeping. This will be topped with an ABD pad for absorbency and secured by wrapping from just below toes to jsut below knees with a Kerlix roll gauze topped with an ACE bandage.  I have provided patient with a bariatric bed for his body habitus (current weight is >standard bed capacity), bilateral Prevalon boots and a sacral foam dressing for pressure injury prevention and a pressure redistribution chair pad for use when OOB to chair.  Expected disposition is to a skilled facility, per MD notes.  WOC nursing team will not follow, but will remain available to this patient, the nursing and medical teams.  Please re-consult if needed. Thanks, Ladona Mow, MSN, RN, GNP, Leda Min, FAAN  Pager# [phone].

## 2020-07-11 NOTE — Progress Notes (Signed)
PROGRESS NOTE    Argil Mahl Eshleman   QMV:[id number]  DOB: 04/10/1951  PCP: [facility]    DOA: 07/10/2020 LOS: 1   Brief Narrative   Kiel Cockerell is a 69 y.o. male with medical history significant for coronary artery disease with stents, severe obesity with BMI 57, COPD, tobacco abuse, prediabetes, CKD stage 3a, and chronic diastolic CHF, presented to the ED via EMS on 07/10/2020 with progressive shortness of breath, worsening lower extremity swelling and erythema (more on right LE, wound present).  Had been placed on Keflex as outpatient and notes marked improvement.  Reports history of MI and denies any similar symptoms or chest pain.  In the ED, afebrile, mildly tachypneic, saturating in the 90s on 10 L/min supplemental oxygen on NRB mask, BP 100/69.  Labs notable for Cr 1.58 (similar to prior), mild leukocytosis, hs-troponin 173>>191, BNP 421.  EKG normal sinus with nonspecific IVCD.  Chest xray showed vascular congestion and increased interstitial markings.  Treated in the ED with aspirin and started on heparin infusion.  Admitted to hospitalist service for further evaluation and management.     Assessment & Plan   Principal Problem:   Acute respiratory failure with hypoxia (HCC) Active Problems:   CAD in native artery   Acute on chronic diastolic CHF (congestive heart failure) (HCC)   Chronic kidney disease, stage 3a   COPD (chronic obstructive pulmonary disease) (HCC)  Acute respiratory failure with hypoxia due to Acute on chronic diastolic CHF - presented with SOB, significant peripheral edema with serous weeping, CXR findings consistent with CHF, new supplemental O2 requirement.  Echo in June 2021 showed EF 50-55%.  Continue IV Lasix 40 mg IV bid, beta blocker, ACEI as BP tolerates.  Monitor volume status with strict I/O's and daily weights.  Low sodium diet with fluid restriction.  Consider V/Q scan to evaluate for PE if hypoxia not improving  with diuresis.  Patient quite active so PE less likely.  Of note, had negative CTA chest on 05/24/20.  LE erythema improving on Keflex makes DVT less likely.  Elevated troponin / Type II NSTEMI - presented with 1 day SOB, new O2 requirement, elevated troponin 173.  Likely demand ischemia in setting of acute CHF and hypoxic respiratory failure.  No chest pain, nausea, diaphoresis or acute ischemic changes on EKG.  Treated with full dose ASA and started on heparin gtt in the ED.  Telemetry.  Continue ASA, Plavix, stain.   Cardiology consulted.  Outpatient stress test.  COPD - not acutely exacerbated on admission, only occasional wheezing.  Acute respiratory symptoms due to CHF more than COPD.  PRN bronchodilators.   - Acute respiratory sxs primarily secondary to CHF  - Nebs as needed    CKD Stage IIIa - Cr 1.58 on admission, stable.  Avoid nephrotoxins and hypotension.  Renally dose meds as indicated.  Monitor BMP.  RLE cellulitis, present on admission, had been on Keflex outpatient with improvement, last day 8/3.  Not septic, tachypnea and leukocytosis more likely due to CHF.  Afebrile.  Monitor.  Tobacco abuse - reported he quit the day prior to admission.  Nicotine patch.  Counseled regarding importance   Morbid Obesity: Body mass index is 58.97 kg/m.  Significantly   DVT prophylaxis:    Diet:  Diet Orders (From admission, onward)    Start     Ordered   07/11/20 0001  Diet NPO time specified Except for: [employer], Sips with Meds  Diet effective  midnight       Question Answer Comment  Except for [employer]   Except for Sips with Meds      07/10/20 2110            Code Status: Full Code    Subjective 07/11/20    Patient seen this AM.  Reports he feels a little better, very tired and fatigued.  No chest pain, denies SOB at rest, no fever/chills.  Says leg swelling little improved.  No other complaints.   Disposition Plan & Communication   Status is: Inpatient  Remains  appropriate for inpatient status due to severity of illness requiring IV therapies and significant O2 requirement.  Dispo: The patient is from: home              Anticipated d/c is to: home              Anticipated d/c date is: 3 days              Patient currently is NOT medically stable for d/c.    Family Communication: none at bedside, will attempt to call   Consults, Procedures, Significant Events   Consultants:   Cardiology  Procedures:   None    Objective   Vitals:   07/11/20 0212 07/11/20 0247 07/11/20 0509 07/11/20 0749  BP: (!) 93/54  (!) 112/57 (!) 98/62  Pulse: 92 90 86 90  Resp: 20  20 18   Temp: 99 F (37.2 C)  98.3 F (36.8 C) 97.8 F (36.6 C)  TempSrc: Oral   Oral  SpO2: 98% 99% 97% 96%  Weight: (!) 202.8 kg     Height: 6\' 1"  (1.854 m)       Intake/Output Summary (Last 24 hours) at 07/11/2020 0830 Last data filed at 07/11/2020 0700 Gross per 24 hour  Intake 115.72 ml  Output 435 ml  Net -319.28 ml   Filed Weights   07/10/20 1820 07/11/20 0212  Weight: (!) 195 kg (!) 202.8 kg    Physical Exam:  General exam: awake, alert, no acute distress, morbidly obese Respiratory system: expiratory wheezes, no rales or rhonchi, normal respiratory effort, on 10 L/min HFNC oxygen. Cardiovascular system: normal S1/S2, RRR, bilateral LE pitting edema.   Central nervous system: A&O x3. no gross focal neurologic deficits, normal speech Extremities: moves all, BLE edema and venous stasis changes Psychiatry: normal mood, congruent affect, judgement and insight appear normal  Labs   Data Reviewed: I have personally reviewed following labs and imaging studies  CBC: Recent Labs  Lab 07/10/20 1822 07/11/20 0336  WBC 12.1* 10.2  HGB 15.0 13.8  HCT 42.1 38.5*  MCV 93.3 94.8  PLT 289 246   Basic Metabolic Panel: Recent Labs  Lab 07/10/20 1822 07/11/20 0336  NA 138 135  K 4.6 4.8  CL 97* 99  CO2 27 26  GLUCOSE 147* 102*  BUN 34* 35*  CREATININE 1.58*  1.61*  CALCIUM 9.6 8.6*  MG  --  2.1   GFR: Estimated Creatinine Clearance: 81.3 mL/min (A) (by C-G formula based on SCr of 1.61 mg/dL (H)). Liver Function Tests: No results for input(s): AST, ALT, ALKPHOS, BILITOT, PROT, ALBUMIN in the last 168 hours. No results for input(s): LIPASE, AMYLASE in the last 168 hours. No results for input(s): AMMONIA in the last 168 hours. Coagulation Profile: Recent Labs  Lab 07/10/20 2050  INR 1.2   Cardiac Enzymes: No results for input(s): CKTOTAL, CKMB, CKMBINDEX, TROPONINI in the last 168 hours.  BNP (last 3 results) No results for input(s): PROBNP in the last 8760 hours. HbA1C: No results for input(s): HGBA1C in the last 72 hours. CBG: No results for input(s): GLUCAP in the last 168 hours. Lipid Profile: No results for input(s): CHOL, HDL, LDLCALC, TRIG, CHOLHDL, LDLDIRECT in the last 72 hours. Thyroid Function Tests: No results for input(s): TSH, T4TOTAL, FREET4, T3FREE, THYROIDAB in the last 72 hours. Anemia Panel: No results for input(s): VITAMINB12, FOLATE, FERRITIN, TIBC, IRON, RETICCTPCT in the last 72 hours. Sepsis Labs: No results for input(s): PROCALCITON, LATICACIDVEN in the last 168 hours.  Recent Results (from the past 240 hour(s))  Blood Culture (routine x 2)     Status: None   Collection Time: 07/02/20 11:29 AM   Specimen: BLOOD  Result Value Ref Range Status   Specimen Description BLOOD LFA  Final   Special Requests   Final    BOTTLES DRAWN AEROBIC AND ANAEROBIC Blood Culture adequate volume   Culture   Final    NO GROWTH 5 DAYS Performed at [facility], [address] [place], [place] [address]    Report Status 07/07/2020 FINAL  Final  Blood Culture (routine x 2)     Status: None   Collection Time: 07/02/20 12:10 PM   Specimen: BLOOD  Result Value Ref Range Status   Specimen Description BLOOD RH  Final   Special Requests   Final    BOTTLES DRAWN AEROBIC AND ANAEROBIC Blood Culture results may not be  optimal due to an excessive volume of blood received in culture bottles   Culture   Final    NO GROWTH 5 DAYS Performed at [facility], [address], [place], [place] [address]    Report Status 07/07/2020 FINAL  Final  SARS Coronavirus 2 by RT PCR (hospital order, performed in [facility] hospital lab) Nasopharyngeal Nasopharyngeal Swab     Status: None   Collection Time: 07/10/20  8:50 PM   Specimen: Nasopharyngeal Swab  Result Value Ref Range Status   SARS Coronavirus 2 NEGATIVE NEGATIVE Final    Comment: (NOTE) SARS-CoV-2 target nucleic acids are NOT DETECTED.  The SARS-CoV-2 RNA is generally detectable in upper and lower respiratory specimens during the acute phase of infection. The lowest concentration of SARS-CoV-2 viral copies this assay can detect is 250 copies / mL. A negative result does not preclude SARS-CoV-2 infection and should not be used as the sole basis for treatment or other patient management decisions.  A negative result may occur with improper specimen collection / handling, submission of specimen other than nasopharyngeal swab, presence of viral mutation(s) within the areas targeted by this assay, and inadequate number of viral copies (<250 copies / mL). A negative result must be combined with clinical observations, patient history, and epidemiological information.  Fact Sheet for Patients:   BoilerBrush.com.cy  Fact Sheet for Healthcare Providers: https://pope.com/  This test is not yet approved or  cleared by the [place] FDA and has been authorized for detection and/or diagnosis of SARS-CoV-2 by FDA under an Emergency Use Authorization (EUA).  This EUA will remain in effect (meaning this test can be used) for the duration of the COVID-19 declaration under Section 564(b)(1) of the Act, 21 U.S.C. section 360bbb-3(b)(1), unless the authorization is terminated or revoked sooner.  Performed  at [facility], [address], [place], [place] [address]       Imaging Studies   DG Chest [place] 1 View  Result Date: 07/10/2020 CLINICAL DATA:  Shortness of breath, CHF EXAM: PORTABLE CHEST 1 VIEW COMPARISON:  Radiograph 07/02/2020, CT 05/24/2020 FINDINGS: Mild central vascular congestion with diffuse Lea increased interstitial prominence similar to comparison studies accounting for differences in technique and inflation. No focally consolidative opacity. No pneumothorax or effusion. Cardiac silhouette is likely accentuated by portable technique. Overall, cardiomediastinal contours are similar to prior. No acute osseous or soft tissue abnormality. Telemetry leads overlie the chest. IMPRESSION: Vascular congestion with some increased interstitial markings, may reflect early interstitial edema/features of CHF. Electronically Signed   By: Kreg Shropshire M.D.   On: 07/10/2020 19:18     Medications   Scheduled Meds: . aspirin EC  81 mg Oral Daily  . cephALEXin  500 mg Oral QID  . clopidogrel  75 mg Oral Daily  . famotidine  20 mg Oral BID  . furosemide  40 mg Intravenous Q12H  . metoprolol succinate  50 mg Oral Daily  . nicotine  21 mg Transdermal Daily  . rosuvastatin  20 mg Oral Daily  . sodium chloride flush  3 mL Intravenous Once   Continuous Infusions: . heparin 1,950 Units/hr (07/11/20 0624)       LOS: 1 day    Time spent: 30 minutes with > 50% spent in coordination of care and direct patient contact.    Pennie Banter, DO [facility]  07/11/2020, 8:30 AM    If 7PM-7AM, please contact night-coverage. How to contact the [facility] Attending or Consulting provider 7A - 7P or covering provider during after hours 7P -7A, for this patient?    1. Check the care team in [facility] and look for a) attending/consulting [facility] provider listed and b) the [name] team listed 2. Log into [email or web address] and use [place]'s universal password to access. If you do not have the  password, please contact the hospital operator. 3. Locate the [facility] provider you are looking for under [facility] and page to a number that you can be directly reached. 4. If you still have difficulty reaching the provider, please page the [facility] (Director on Call) for the Hospitalists listed on amion for assistance.

## 2020-07-11 NOTE — Hospital Course (Signed)
Thomas Tanner is a 69 y.o. male with medical history significant for coronary artery disease with stents, severe obesity with BMI 57, COPD, tobacco abuse, prediabetes, CKD stage 3a, and chronic diastolic CHF, presented to the ED via EMS on 07/10/2020 with progressive shortness of breath, worsening lower extremity swelling and erythema (more on right LE, wound present).  Had been placed on Keflex as outpatient and notes marked improvement.  Reports history of MI and denies any similar symptoms or chest pain.  In the ED, afebrile, mildly tachypneic, saturating in the 90s on 10 L/min supplemental oxygen on NRB mask, BP 100/69.  Labs notable for Cr 1.58 (similar to prior), mild leukocytosis, hs-troponin 173>>191, BNP 421.  EKG normal sinus with nonspecific IVCD.  Chest xray showed vascular congestion and increased interstitial markings.  Treated in the ED with aspirin and started on heparin infusion.  Admitted to hospitalist service for further evaluation and management.

## 2020-07-11 NOTE — Plan of Care (Signed)
  Problem: Clinical Measurements: Goal: Respiratory complications will improve Outcome: Progressing   Problem: Safety: Goal: Ability to remain free from injury will improve Outcome: Progressing   Problem: Cardiac: Goal: Ability to achieve and maintain adequate cardiopulmonary perfusion will improve Outcome: Progressing

## 2020-07-12 DIAGNOSIS — L8990 Pressure ulcer of unspecified site, unspecified stage: Secondary | ICD-10-CM | POA: Insufficient documentation

## 2020-07-12 LAB — CBC
HCT: 36.9 % — ABNORMAL LOW (ref 39.0–52.0)
Hemoglobin: 13 g/dL (ref 13.0–17.0)
MCH: 33.6 pg (ref 26.0–34.0)
MCHC: 35.2 g/dL (ref 30.0–36.0)
MCV: 95.3 fL (ref 80.0–100.0)
Platelets: 241 10*3/uL (ref 150–400)
RBC: 3.87 MIL/uL — ABNORMAL LOW (ref 4.22–5.81)
RDW: 13.2 % (ref 11.5–15.5)
WBC: 9.3 10*3/uL (ref 4.0–10.5)
nRBC: 0 % (ref 0.0–0.2)

## 2020-07-12 LAB — BASIC METABOLIC PANEL
Anion gap: 11 (ref 5–15)
BUN: 37 mg/dL — ABNORMAL HIGH (ref 8–23)
CO2: 28 mmol/L (ref 22–32)
Calcium: 9 mg/dL (ref 8.9–10.3)
Chloride: 96 mmol/L — ABNORMAL LOW (ref 98–111)
Creatinine, Ser: 1.47 mg/dL — ABNORMAL HIGH (ref 0.61–1.24)
GFR calc Af Amer: 56 mL/min — ABNORMAL LOW (ref >60)
GFR calc non Af Amer: 49 mL/min — ABNORMAL LOW (ref >60)
Glucose, Bld: 194 mg/dL — ABNORMAL HIGH (ref 70–99)
Potassium: 4.5 mmol/L (ref 3.5–5.1)
Sodium: 135 mmol/L (ref 135–145)

## 2020-07-12 MED ORDER — IPRATROPIUM-ALBUTEROL 0.5-2.5 (3) MG/3ML IN SOLN
3.0000 mL | Freq: Three times a day (TID) | RESPIRATORY_TRACT | Status: DC
  Administered 2020-07-13 – 2020-07-18 (×17): 3 mL via RESPIRATORY_TRACT
  Filled 2020-07-12 (×17): qty 3

## 2020-07-12 NOTE — Plan of Care (Signed)

## 2020-07-12 NOTE — TOC Initial Note (Signed)
Transition of Care [facility]) - Initial/Assessment Note    Patient Details  Name: Thomas Tanner MRN: [id number] Date of Birth: March 26, 1951  [facility]) CM/SW Contact:    Eilleen Kempf, LCSW Phone Number: 07/12/2020, 3:56 PM  Clinical Narrative:                 Thomas Tanner is a 69 y.o. male who presents to the emergency department today with primary complaint for shortness of breath.  Patient states that his shortness of breath started yesterday.  States it did start somewhat suddenly.          Patient Goals and CMS Choice  Patient states " just want to feel better and go home      Expected Discharge Plan and Services  Patient lives with spouse and plans to return home at discharge                                              Prior Living Arrangements/Services                       Activities of Daily Living Home Assistive Devices/Equipment: Dan Humphreys (specify type) ADL Screening (condition at time of admission) Patient's cognitive ability adequate to safely complete daily activities?: Yes Is the patient deaf or have difficulty hearing?: No Does the patient have difficulty seeing, even when wearing glasses/contacts?: No Does the patient have difficulty concentrating, remembering, or making decisions?: No Patient able to express need for assistance with ADLs?: Yes Does the patient have difficulty dressing or bathing?: Yes (past few days wife helps him bec. SOB) Independently performs ADLs?: No (as per pt wife helps) Communication: Independent Dressing (OT): Needs assistance Is this a change from baseline?: Change from baseline, expected to last <3days Grooming: Independent Is this a change from baseline?: Pre-admission baseline Feeding: Independent Bathing: Needs assistance Is this a change from baseline?: Pre-admission baseline Toileting: Needs assistance Is this a change from baseline?: Pre-admission baseline In/Out  Bed: Needs assistance Is this a change from baseline?: Pre-admission baseline Walks in Home: Needs assistance Is this a change from baseline?: Pre-admission baseline Does the patient have difficulty walking or climbing stairs?: Yes Weakness of Legs: None Weakness of Arms/Hands: None  Permission Sought/Granted                  Emotional Assessment              Admission diagnosis:  SOB (shortness of breath) [R06.02] Elevated troponin [R77.8] Acute respiratory failure with hypoxia (HCC) [J96.01] Patient Active Problem List   Diagnosis Date Noted  . Pressure injury of skin 07/12/2020  . Acute respiratory failure with hypoxia (HCC) 07/10/2020  . COPD (chronic obstructive pulmonary disease) (HCC)   . Elevated troponin   . Acute on chronic diastolic CHF (congestive heart failure) (HCC) 05/24/2020  . Anasarca   . Chronic kidney disease, stage 3a   . Hyperlipidemia   . Tobacco abuse   . Shortness of breath 04/28/2020  . Upper respiratory tract infection due to COVID-19 virus 04/28/2020  . Edema 04/28/2020  . Encounter for general adult medical examination with abnormal findings 01/15/2019  . Acute non-recurrent maxillary sinusitis 01/15/2019  . Gastroesophageal reflux disease without esophagitis 01/15/2019  . CAD in native artery 12/16/2018  . Morbid obesity with BMI of 50.0-59.9, adult (HCC) 12/16/2018  .  Moderate cigarette smoker 12/16/2018  . Chronic pain of right knee 08/04/2018  . Mild intermittent asthma 08/04/2018  . Impaired fasting glucose 04/03/2018  . Low back pain with right-sided sciatica 03/05/2018  . Chronic right-sided low back pain with right-sided sciatica 03/05/2018  . Pain in right hip 03/05/2018  . Hypertension 03/05/2018  . Foreign body in bladder and urethra 06/09/2013  . Kidney stone 06/09/2013  . Ureteric stone 06/09/2013  . Kidney stone 06/09/2013   PCP:  [facility] Pharmacy:   [facility] [address]), [place]  - [address] [place] ([address]) [place] [address] Phone: [phone] Fax: [phone]  [facility] [place] [place] - [address] [place], [address] [place], [address] [place] [address] Phone: [phone] Fax: [phone]     Social Determinants of Health (SDOH) Interventions    Readmission Risk Interventions No flowsheet data found.

## 2020-07-12 NOTE — Progress Notes (Signed)
[facility] Card[facility]    SUBJECTIVE: [name] is a 69 year old male with a past medical history significant for coronary artery disease s/p STEMI on 12/08/18 with PCI to RCA, COPD, history of a TIA, hyperlipidemia, type 2 diabetes, obstructive sleep apnea, hyperlipidemia, hypertension, and morbid obesity who presented to the ED on 07/10/20 for a 1 day history of shortness of breath.  Workup in the ED was significant for chest xray revealing vascular congestion with increased interstitial markings, ECG revealing sinus rhythm with a RBBB, high sensitivity troponin 173, 191, and 124 respectively, BNP of 421, and creatinine of 1.58.   07/12/20: Mr. Bielinski reports significant improvement in shortness of breath, orthopnea, and PND.  He denies any recent or current episodes of chest pain or chest pressure.  Lower extremity swelling is improving with Compression boots and Lasix.  He has not ambulated yet today.    Vitals:   07/12/20 0230 07/12/20 0518 07/12/20 0715 07/12/20 0841  BP:  109/61  (!) 124/45  Pulse:  (!) 103  (!) 106  Resp:    19  Temp:  98.2 F (36.8 C)  98.5 F (36.9 C)  TempSrc:  Oral  Oral  SpO2: 94% 96% 96% 94%  Weight:      Height:         Intake/Output Summary (Last 24 hours) at 07/12/2020 0842 Last data filed at 07/12/2020 0544 Gross per 24 hour  Intake 480 ml  Output 5325 ml  Net -4845 ml      PHYSICAL EXAM  General: Morbidly obese, in no acute distress HEENT:  Normocephalic and atramatic Neck:  No JVD.  Lungs: On supplemental O2 via nasal cannula. Clear bilaterally to auscultation and percussion. Heart: HRRR . Normal S1 and S2 without gallops or murmurs.  Abdomen: Bowel sounds are positive, abdomen soft and non-tender  Msk:  Back normal. Normal strength and tone for age. Extremities: No clubbing or cyanosis.  Compression boots in place    Neuro: Alert and oriented X 3. Psych:  Good affect, responds appropriately   LABS: Basic Metabolic Panel: Recent Labs     07/11/20 0336 07/12/20 0524  NA 135 135  K 4.8 4.5  CL 99 96*  CO2 26 28  GLUCOSE 102* 194*  BUN 35* 37*  CREATININE 1.61* 1.47*  CALCIUM 8.6* 9.0  MG 2.1  --    Liver Function Tests: No results for input(s): AST, ALT, ALKPHOS, BILITOT, PROT, ALBUMIN in the last 72 hours. No results for input(s): LIPASE, AMYLASE in the last 72 hours. CBC: Recent Labs    07/11/20 0336 07/12/20 0524  WBC 10.2 9.3  HGB 13.8 13.0  HCT 38.5* 36.9*  MCV 94.8 95.3  PLT 246 241   Cardiac Enzymes: No results for input(s): CKTOTAL, CKMB, CKMBINDEX, TROPONINI in the last 72 hours. BNP: Invalid input(s): POCBNP D-Dimer: No results for input(s): DDIMER in the last 72 hours. Hemoglobin A1C: No results for input(s): HGBA1C in the last 72 hours. Fasting Lipid Panel: No results for input(s): CHOL, HDL, LDLCALC, TRIG, CHOLHDL, LDLDIRECT in the last 72 hours. Thyroid Function Tests: No results for input(s): TSH, T4TOTAL, T3FREE, THYROIDAB in the last 72 hours.  Invalid input(s): FREET3 Anemia Panel: No results for input(s): VITAMINB12, FOLATE, FERRITIN, TIBC, IRON, RETICCTPCT in the last 72 hours.  DG Chest [place] 1 View  Result Date: 07/10/2020 CLINICAL DATA:  Shortness of breath, CHF EXAM: PORTABLE CHEST 1 VIEW COMPARISON:  Radiograph 07/02/2020, CT 05/24/2020 FINDINGS: Mild central vascular congestion with diffuse  Lea increased interstitial prominence similar to comparison studies accounting for differences in technique and inflation. No focally consolidative opacity. No pneumothorax or effusion. Cardiac silhouette is likely accentuated by portable technique. Overall, cardiomediastinal contours are similar to prior. No acute osseous or soft tissue abnormality. Telemetry leads overlie the chest. IMPRESSION: Vascular congestion with some increased interstitial markings, may reflect early interstitial edema/features of CHF. Electronically Signed   By: Kreg Shropshire M.D.   On: 07/10/2020 19:18     Echo:   Low normal LV systolic function with an EF estimated between 50-55% with mild LVH; valvular function not well visualized   TELEMETRY: Normal sinus rhythm   ASSESSMENT AND PLAN:  Principal Problem:   Acute respiratory failure with hypoxia (HCC) Active Problems:   CAD in native artery   Acute on chronic diastolic CHF (congestive heart failure) (HCC)   Chronic kidney disease, stage 3a   COPD (chronic obstructive pulmonary disease) (HCC)   Pressure injury of skin    1.  Acute on chronic HFpEF  -Echocardiogram this admission revealing low normal LV systolic function with an EF estimated between 50-55%  -Continue Lasix injection 40mg  q 12 hours with close monitoring of renal function   -Would recommend ambulating today with home PT upon discharge   2.  Elevated troponin   -High sensitivity troponin mildly elevated with no significant delta; likely demand ischemia in the setting of acute on chronic HFpEF, underlying COPD   -No further invasive workup indicated from a cardiac standpoint   3.  Coronary artery disease   -S/p PCI to RCA in  -Currently on aspirin and Plavix   -Continue metoprolol 50mg  once daily   4.  COPD   -Continue nebulizer treatments, supplemental O2  5.  Tobacco abuse   -Nicotine patch in place  -Counseling on smoking cessation provided   6.  Chronic kidney disease   -Creatinine 1.58 on arrival, currently 1.47 which is near baseline   7.  Morbid obesity   -Had long discussion about the importance of weight loss through dietary modifications   -Would recommend home PT at discharge   The history, physical exam findings, and plan of care were all discussed with Dr. [id number], and all decision making was made in collaboration.    PA-C  07/12/2020 8:42 AM

## 2020-07-12 NOTE — Plan of Care (Signed)
  Problem: Education: Goal: Knowledge of General Education information will improve Description Including pain rating scale, medication(s)/side effects and non-pharmacologic comfort measures Outcome: Progressing   Problem: Health Behavior/Discharge Planning: Goal: Ability to manage health-related needs will improve Outcome: Progressing   

## 2020-07-12 NOTE — Evaluation (Signed)
Physical Therapy Evaluation Patient Details Name: Thomas Tanner MRN: [id number] DOB: Dec 02, 1951 Today's Date: 07/12/2020   History of Present Illness  69 y.o. male with PMH that includes: COPD, DM, HTN, TIA, tachycardia, and CAD as well as COVID in April; he was here with leg swelling, weight gain, stomach and scrotum swelling and also shortness of breath ~6 weeks ago.  Now here with acute respiratory failure.  Clinical Impression  Pt laying in bed with O2 off on arrival, O2 was in the high 80s and stayed relatively stable even with activity and ~20 ft of ambulation in the room.  Pt did need some assist (using PT's hand as rail) to get to sitting upright EOB, but was able to rise to standing and ambulate w/o physical assist.  Overall he reports feeling close to his baseline and is breathing much better than on arrival. Pt needed to have BM, assisted to bari [facility] and nursing in room and assisting.  Sats remained relatively t/o, reapplied [place] with sats quickly increasing to the mid 90s while sitting on commode.      Follow Up Recommendations Home health PT    Equipment Recommendations  None recommended by PT    Recommendations for Other Services       Precautions / Restrictions Precautions Precautions: Fall Restrictions Weight Bearing Restrictions: No      Mobility  Bed Mobility Overal bed mobility: Needs Assistance Bed Mobility: Supine to Sit     Supine to sit: Mod assist     General bed mobility comments: Pt able to initiate movement toward getting to EOB, did need PT's hand to assist/pull to full upright  Transfers Overall transfer level: Modified independent Equipment used: Rolling walker (2 wheeled)             General transfer comment: Pt was able to rise to standing with relative ease from bari-bed, not overly reliant on UEs but needing some momentum  Ambulation/Gait Ambulation/Gait assistance: Min guard Gait Distance (Feet): 20 Feet Assistive device:  Rolling walker (2 wheeled)       General Gait Details: Pt with relatively confident and safe ambulation in room.  He had some fatigue but overall did well, and reports feeling essentially near baseline  Stairs            Wheelchair Mobility    Modified Rankin (Stroke Patients Only)       Balance Overall balance assessment: Needs assistance Sitting-balance support: Bilateral upper extremity supported Sitting balance-Leahy Scale: Fair Sitting balance - Comments: Pt able to sit at EOB w/o assist, but did have some posterior lean   Standing balance support: Bilateral upper extremity supported Standing balance-Leahy Scale: Fair Standing balance comment: Pt reliant on the walker, but able to maintain standing balance well and w/o assist                             Pertinent Vitals/Pain Pain Assessment:  (chronic hip and knee pain R>L 2/2 OA)    Home Living Family/patient expects to be discharged to:: Private residence Living Arrangements: Spouse/significant other;Children;Other relatives Available Help at Discharge: Family;Available 24 hours/day Type of Home: House Home Access: Stairs to enter Entrance Stairs-Rails: Can reach both;Left;Right Entrance Stairs-Number of Steps: 6 steps Home Layout: One level Home Equipment: Walker - 4 wheels;Adaptive equipment      Prior Function Level of Independence: Needs assistance   Gait / Transfers Assistance Needed: Pt reports that for that last many  months he has only rarely walked more than then 30 or so ft to his bathroom  ADL's / Homemaking Assistance Needed: family helps with some dressing and some ADLs        Hand Dominance        Extremity/Trunk Assessment   Upper Extremity Assessment Upper Extremity Assessment: Overall WFL for tasks assessed;Generalized weakness    Lower Extremity Assessment Lower Extremity Assessment: Overall WFL for tasks assessed;Generalized weakness       Communication    Communication: No difficulties  Cognition Arousal/Alertness: Awake/alert Behavior During Therapy: WFL for tasks assessed/performed Overall Cognitive Status: Within Functional Limits for tasks assessed                                        General Comments      Exercises     Assessment/Plan    PT Assessment Patient needs continued PT services  PT Problem List Decreased strength;Decreased range of motion;Decreased activity tolerance;Decreased balance;Decreased mobility;Decreased knowledge of use of DME;Decreased safety awareness;Pain       PT Treatment Interventions DME instruction;Gait training;Stair training;Functional mobility training;Therapeutic activities;Therapeutic exercise;Balance training;Neuromuscular re-education;Patient/family education    PT Goals (Current goals can be found in the Care Plan section)  Acute Rehab PT Goals Patient Stated Goal: go home PT Goal Formulation: With patient Time For Goal Achievement: 07/26/20 Potential to Achieve Goals: Good    Frequency Min 2X/week   Barriers to discharge        Co-evaluation               AM-PAC PT "6 Clicks" Mobility  Outcome Measure Help needed turning from your back to your side while in a flat bed without using bedrails?: A Little Help needed moving from lying on your back to sitting on the side of a flat bed without using bedrails?: A Little Help needed moving to and from a bed to a chair (including a wheelchair)?: A Little Help needed standing up from a chair using your arms (e.g., wheelchair or bedside chair)?: A Little Help needed to walk in hospital room?: A Little Help needed climbing 3-5 steps with a railing? : A Lot 6 Click Score: 17    End of Session Equipment Utilized During Treatment: Gait belt;Oxygen (6 L) Activity Tolerance: Patient tolerated treatment well Patient left: in chair;with nursing/sitter in room (bed side commode)   PT Visit Diagnosis: Difficulty in  walking, not elsewhere classified (R26.2);Muscle weakness (generalized) (M62.81)    Time: 4081-4481 PT Time Calculation (min) (ACUTE ONLY): 32 min   Charges:   PT Evaluation $PT Eval Low Complexity: 1 Low PT Treatments $Therapeutic Activity: 8-22 mins        Malachi Pro, DPT 07/12/2020, 6:15 PM

## 2020-07-12 NOTE — Progress Notes (Signed)
PROGRESS NOTE    Thomas Tanner   OLM:[id number]  DOB: 10-26-1951  PCP: [facility]    DOA: 07/10/2020 LOS: 2   Brief Narrative   Thomas Tanner is a 69 y.o. male with medical history significant for coronary artery disease with stents, severe obesity with BMI 57, COPD, tobacco abuse, prediabetes, CKD stage 3a, and chronic diastolic CHF, presented to the ED via EMS on 07/10/2020 with progressive shortness of breath, worsening lower extremity swelling and erythema (more on right LE, wound present).  Had been placed on Keflex as outpatient and notes marked improvement.  Reports history of MI and denies any similar symptoms or chest pain.  In the ED, afebrile, mildly tachypneic, saturating in the 90s on 10 L/min supplemental oxygen on NRB mask, BP 100/69.  Labs notable for Cr 1.58 (similar to prior), mild leukocytosis, hs-troponin 173>>191, BNP 421.  EKG normal sinus with nonspecific IVCD.  Chest xray showed vascular congestion and increased interstitial markings.  Treated in the ED with aspirin and started on heparin infusion.  Admitted to hospitalist service for further evaluation and management.     Assessment & Plan   Principal Problem:   Acute respiratory failure with hypoxia (HCC) Active Problems:   CAD in native artery   Acute on chronic diastolic CHF (congestive heart failure) (HCC)   Chronic kidney disease, stage 3a   COPD (chronic obstructive pulmonary disease) (HCC)   Pressure injury of skin  Acute respiratory failure with hypoxia due to Acute on chronic diastolic CHF - presented with SOB, significant peripheral edema with serous weeping, CXR findings consistent with CHF, new supplemental O2 requirement.  Echo in June 2021 showed EF 50-55%.  Continue IV Lasix 40 mg IV bid, beta blocker, ACEI as BP tolerates.  Monitor volume status with strict I/O's and daily weights.  Low sodium diet with fluid restriction.  Consider V/Q scan to evaluate for  PE if hypoxia not improving with diuresis.  Patient quite active so PE less likely.  Of note, had negative CTA chest on 05/24/20.  LE erythema improving on Keflex makes DVT less likely.  Elevated troponin / Type II NSTEMI - presented with 1 day SOB, new O2 requirement, elevated troponin 173.  Likely demand ischemia in setting of acute CHF and hypoxic respiratory failure.  No chest pain, nausea, diaphoresis or acute ischemic changes on EKG.  Treated with full dose ASA and started on heparin gtt in the ED.  Telemetry.  Continue ASA, Plavix, stain.   Cardiology consulted.  Outpatient stress test.  COPD - not acutely exacerbated on admission, only occasional wheezing.  Acute respiratory symptoms due to CHF more than COPD.  PRN bronchodilators.   - Acute respiratory sxs primarily secondary to CHF  - Nebs as needed    CKD Stage IIIa - Cr 1.58 on admission, stable.  Avoid nephrotoxins and hypotension.  Renally dose meds as indicated.  Monitor BMP.  RLE cellulitis, present on admission, had been on Keflex outpatient with improvement, last day 8/3.  Not septic, tachypnea and leukocytosis more likely due to CHF.  Afebrile.  Monitor.  Tobacco abuse - reported he quit the day prior to admission.  Nicotine patch.  Counseled regarding importance   Morbid Obesity: Body mass index is 58.97 kg/m.  Significantly   DVT prophylaxis: enoxaparin (LOVENOX) injection 40 mg Start: 07/11/20 2200   Diet:  Diet Orders (From admission, onward)    Start     Ordered   07/11/20 0903  Diet  heart healthy/carb modified Room service appropriate? Yes; Fluid consistency: Thin  Diet effective now       Question Answer Comment  Diet-HS Snack? Nothing   Room service appropriate? Yes   Fluid consistency: Thin      07/11/20 0902            Code Status: Full Code    Subjective 07/12/20    Patient seen this AM.  Says he's feeling better, shortness of breath and swelling are improving.  Denies fever/chills, chest pain,  n/v or other acute complaints.   Disposition Plan & Communication   Status is: Inpatient  Remains appropriate for inpatient status due to severity of illness requiring IV therapies and significant O2 requirement.  IV diuresing.  Dispo: The patient is from: home              Anticipated d/c is to: home              Anticipated d/c date is: 3 days              Patient currently is NOT medically stable for d/c.    Family Communication: none at bedside, will attempt to call   Consults, Procedures, Significant Events   Consultants:   Cardiology  Procedures:   None    Objective   Vitals:   07/12/20 0841 07/12/20 0854 07/12/20 1134 07/12/20 1304  BP: (!) 124/45  (!) 113/55   Pulse: (!) 106  88   Resp: 19  18   Temp: 98.5 F (36.9 C)  97.9 F (36.6 C)   TempSrc: Oral     SpO2: 94% 93% 95% 92%  Weight:      Height:        Intake/Output Summary (Last 24 hours) at 07/12/2020 1619 Last data filed at 07/12/2020 1330 Gross per 24 hour  Intake 1320 ml  Output 5850 ml  Net -4530 ml   Filed Weights   07/10/20 1820 07/11/20 0212  Weight: (!) 195 kg (!) 202.8 kg    Physical Exam:  General exam: awake, alert, no acute distress, morbidly obese Respiratory system: dimished due to body habitus but overall clear, single expiratory wheeze appreciated, no rales or rhonchi, normal respiratory effort, on 8 L/min HFNC oxygen. Cardiovascular system: normal S1/S2, RRR, bilateral LE pitting edema.   Central nervous system: A&O x3. no gross focal neurologic deficits, normal speech Extremities: moves all, BLE edema and venous stasis changes - edema is mildly improved Psychiatry: normal mood, congruent affect, judgement and insight appear normal  Labs   Data Reviewed: I have personally reviewed following labs and imaging studies  CBC: Recent Labs  Lab 07/10/20 1822 07/11/20 0336 07/12/20 0524  WBC 12.1* 10.2 9.3  HGB 15.0 13.8 13.0  HCT 42.1 38.5* 36.9*  MCV 93.3 94.8 95.3   PLT 289 246 241   Basic Metabolic Panel: Recent Labs  Lab 07/10/20 1822 07/11/20 0336 07/12/20 0524  NA 138 135 135  K 4.6 4.8 4.5  CL 97* 99 96*  CO2 27 26 28   GLUCOSE 147* 102* 194*  BUN 34* 35* 37*  CREATININE 1.58* 1.61* 1.47*  CALCIUM 9.6 8.6* 9.0  MG  --  2.1  --    GFR: Estimated Creatinine Clearance: 89 mL/min (A) (by C-G formula based on SCr of 1.47 mg/dL (H)). Liver Function Tests: No results for input(s): AST, ALT, ALKPHOS, BILITOT, PROT, ALBUMIN in the last 168 hours. No results for input(s): LIPASE, AMYLASE in the last 168 hours. No  results for input(s): AMMONIA in the last 168 hours. Coagulation Profile: Recent Labs  Lab 07/10/20 2050  INR 1.2   Cardiac Enzymes: No results for input(s): CKTOTAL, CKMB, CKMBINDEX, TROPONINI in the last 168 hours. BNP (last 3 results) No results for input(s): PROBNP in the last 8760 hours. HbA1C: No results for input(s): HGBA1C in the last 72 hours. CBG: No results for input(s): GLUCAP in the last 168 hours. Lipid Profile: No results for input(s): CHOL, HDL, LDLCALC, TRIG, CHOLHDL, LDLDIRECT in the last 72 hours. Thyroid Function Tests: No results for input(s): TSH, T4TOTAL, FREET4, T3FREE, THYROIDAB in the last 72 hours. Anemia Panel: No results for input(s): VITAMINB12, FOLATE, FERRITIN, TIBC, IRON, RETICCTPCT in the last 72 hours. Sepsis Labs: No results for input(s): PROCALCITON, LATICACIDVEN in the last 168 hours.  Recent Results (from the past 240 hour(s))  SARS Coronavirus 2 by RT PCR (hospital order, performed in [facility] hospital lab) Nasopharyngeal Nasopharyngeal Swab     Status: None   Collection Time: 07/10/20  8:50 PM   Specimen: Nasopharyngeal Swab  Result Value Ref Range Status   SARS Coronavirus 2 NEGATIVE NEGATIVE Final    Comment: (NOTE) SARS-CoV-2 target nucleic acids are NOT DETECTED.  The SARS-CoV-2 RNA is generally detectable in upper and lower respiratory specimens during the acute phase  of infection. The lowest concentration of SARS-CoV-2 viral copies this assay can detect is 250 copies / mL. A negative result does not preclude SARS-CoV-2 infection and should not be used as the sole basis for treatment or other patient management decisions.  A negative result may occur with improper specimen collection / handling, submission of specimen other than nasopharyngeal swab, presence of viral mutation(s) within the areas targeted by this assay, and inadequate number of viral copies (<250 copies / mL). A negative result must be combined with clinical observations, patient history, and epidemiological information.  Fact Sheet for Patients:   BoilerBrush.com.cyhttps://www.fda.gov/media/136312/download  Fact Sheet for Healthcare Providers: https://pope.com/https://www.fda.gov/media/136313/download  This test is not yet approved or  cleared by the [place] FDA and has been authorized for detection and/or diagnosis of SARS-CoV-2 by FDA under an Emergency Use Authorization (EUA).  This EUA will remain in effect (meaning this test can be used) for the duration of the COVID-19 declaration under Section 564(b)(1) of the Act, 21 U.S.C. section 360bbb-3(b)(1), unless the authorization is terminated or revoked sooner.  Performed at [facility], [address] [place], [place] [address]       Imaging Studies   DG Chest [place] 1 View  Result Date: 07/10/2020 CLINICAL DATA:  Shortness of breath, CHF EXAM: PORTABLE CHEST 1 VIEW COMPARISON:  Radiograph 07/02/2020, CT 05/24/2020 FINDINGS: Mild central vascular congestion with diffuse Lea increased interstitial prominence similar to comparison studies accounting for differences in technique and inflation. No focally consolidative opacity. No pneumothorax or effusion. Cardiac silhouette is likely accentuated by portable technique. Overall, cardiomediastinal contours are similar to prior. No acute osseous or soft tissue abnormality. Telemetry leads overlie the  chest. IMPRESSION: Vascular congestion with some increased interstitial markings, may reflect early interstitial edema/features of CHF. Electronically Signed   By: Kreg ShropshirePrice  DeHay M.D.   On: 07/10/2020 19:18     Medications   Scheduled Meds: . aspirin EC  81 mg Oral Daily  . cephALEXin  500 mg Oral QID  . clopidogrel  75 mg Oral Daily  . dextromethorphan-guaiFENesin  1 tablet Oral BID  . enoxaparin (LOVENOX) injection  40 mg Subcutaneous Q12H  . famotidine  20 mg Oral BID  . furosemide  40 mg Intravenous Q12H  . ipratropium-albuterol  3 mL Nebulization Q6H  . methylPREDNISolone (SOLU-MEDROL) injection  40 mg Intravenous BID  . metoprolol succinate  50 mg Oral Daily  . nicotine  21 mg Transdermal Daily  . rosuvastatin  20 mg Oral Daily   Continuous Infusions:      LOS: 2 days    Time spent: 25 minutes with > 50% spent in coordination of care and direct patient contact.    Pennie Banter, DO [facility]  07/12/2020, 4:19 PM    If 7PM-7AM, please contact night-coverage. How to contact the [facility] Attending or Consulting provider 7A - 7P or covering provider during after hours 7P -7A, for this patient?    1. Check the care team in [facility] and look for a) attending/consulting [facility] provider listed and b) the [facility] team listed 2. Log into [email or web address] and use [place]'s universal password to access. If you do not have the password, please contact the hospital operator. 3. Locate the [facility] provider you are looking for under [facility] and page to a number that you can be directly reached. 4. If you still have difficulty reaching the provider, please page the [facility] (Director on Call) for the Hospitalists listed on amion for assistance.

## 2020-07-13 LAB — CBC
HCT: 40.3 % (ref 39.0–52.0)
Hemoglobin: 14.3 g/dL (ref 13.0–17.0)
MCH: 32.4 pg (ref 26.0–34.0)
MCHC: 35.5 g/dL (ref 30.0–36.0)
MCV: 91.4 fL (ref 80.0–100.0)
Platelets: 259 10*3/uL (ref 150–400)
RBC: 4.41 MIL/uL (ref 4.22–5.81)
RDW: 13.2 % (ref 11.5–15.5)
WBC: 11.8 10*3/uL — ABNORMAL HIGH (ref 4.0–10.5)
nRBC: 0 % (ref 0.0–0.2)

## 2020-07-13 LAB — BASIC METABOLIC PANEL
Anion gap: 12 (ref 5–15)
BUN: 37 mg/dL — ABNORMAL HIGH (ref 8–23)
CO2: 30 mmol/L (ref 22–32)
Calcium: 9 mg/dL (ref 8.9–10.3)
Chloride: 94 mmol/L — ABNORMAL LOW (ref 98–111)
Creatinine, Ser: 1.37 mg/dL — ABNORMAL HIGH (ref 0.61–1.24)
GFR calc Af Amer: >60 mL/min (ref >60)
GFR calc non Af Amer: 53 mL/min — ABNORMAL LOW (ref >60)
Glucose, Bld: 142 mg/dL — ABNORMAL HIGH (ref 70–99)
Potassium: 4.6 mmol/L (ref 3.5–5.1)
Sodium: 136 mmol/L (ref 135–145)

## 2020-07-13 NOTE — Progress Notes (Signed)
[facility] Cardiology    SUBJECTIVE: Thomas Tanner is a 69 year old male with a past medical history significant for coronary artery disease s/p STEMI on 12/08/18 with PCI to RCA, COPD, history of a TIA, hyperlipidemia, type 2 diabetes, obstructive sleep apnea, hyperlipidemia, hypertension, and morbid obesity who presented to the ED on 07/10/20 for a 1 day history of shortness of breath.  Workup in the ED was significant for chest xray revealing vascular congestion with increased interstitial markings, ECG revealing sinus rhythm with a RBBB, high sensitivity troponin 173, 191, and 124 respectively, BNP of 421, and creatinine of 1.58.   07/12/20: Thomas Tanner reports significant improvement in shortness of breath, orthopnea, and PND.  He denies any recent or current episodes of chest pain or chest pressure.  Lower extremity swelling is improving with Compression boots and Lasix.  He has not ambulated yet today.   07/13/20:  He reports shortness of breath continues to improve. Continues to deny chest pain or chest pressure. Compression boots in place for lower extremity swelling.  Ambulated yesterday with physical therapy    Vitals:   07/12/20 2010 07/13/20 0507 07/13/20 0829 07/13/20 0837  BP: 126/75 116/68  (!) 113/53  Pulse: 88 98  (!) 102  Resp:    18  Temp: 98.7 F (37.1 C) 98.2 F (36.8 C)    TempSrc: Oral Oral    SpO2: 96% 96% 90% 98%  Weight:  (!) 195.8 kg    Height:         Intake/Output Summary (Last 24 hours) at 07/13/2020 1004 Last data filed at 07/13/2020 0855 Gross per 24 hour  Intake 840 ml  Output 6150 ml  Net -5310 ml      PHYSICAL EXAM  General: Morbidly obese, in no acute distress HEENT:  Normocephalic and atramatic Neck:  No JVD.  Lungs: On supplemental O2 via nasal cannula. Clear bilaterally to auscultation and percussion. Heart: HRRR . Normal S1 and S2 without gallops or murmurs.  Abdomen: Bowel sounds are positive, abdomen soft and non-tender  Msk:  Back normal. Normal  strength and tone for age. Extremities: No clubbing or cyanosis.  Compression boots in place    Neuro: Alert and oriented X 3. Psych:  Good affect, responds appropriately   LABS: Basic Metabolic Panel: Recent Labs    07/11/20 0336 07/11/20 0336 07/12/20 0524 07/13/20 0636  NA 135   < > 135 136  K 4.8   < > 4.5 4.6  CL 99   < > 96* 94*  CO2 26   < > 28 30  GLUCOSE 102*   < > 194* 142*  BUN 35*   < > 37* 37*  CREATININE 1.61*   < > 1.47* 1.37*  CALCIUM 8.6*   < > 9.0 9.0  MG 2.1  --   --   --    < > = values in this interval not displayed.   Liver Function Tests: No results for input(s): AST, ALT, ALKPHOS, BILITOT, PROT, ALBUMIN in the last 72 hours. No results for input(s): LIPASE, AMYLASE in the last 72 hours. CBC: Recent Labs    07/12/20 0524 07/13/20 0636  WBC 9.3 11.8*  HGB 13.0 14.3  HCT 36.9* 40.3  MCV 95.3 91.4  PLT 241 259   Cardiac Enzymes: No results for input(s): CKTOTAL, CKMB, CKMBINDEX, TROPONINI in the last 72 hours. BNP: Invalid input(s): POCBNP D-Dimer: No results for input(s): DDIMER in the last 72 hours. Hemoglobin A1C: No results for input(s):  HGBA1C in the last 72 hours. Fasting Lipid Panel: No results for input(s): CHOL, HDL, LDLCALC, TRIG, CHOLHDL, LDLDIRECT in the last 72 hours. Thyroid Function Tests: No results for input(s): TSH, T4TOTAL, T3FREE, THYROIDAB in the last 72 hours.  Invalid input(s): FREET3 Anemia Panel: No results for input(s): VITAMINB12, FOLATE, FERRITIN, TIBC, IRON, RETICCTPCT in the last 72 hours.  No results found.   Echo:  Low normal LV systolic function with an EF estimated between 50-55% with mild LVH; valvular function not well visualized   TELEMETRY: Normal sinus rhythm   ASSESSMENT AND PLAN:  Principal Problem:   Acute respiratory failure with hypoxia (HCC) Active Problems:   CAD in native artery   Acute on chronic diastolic CHF (congestive heart failure) (HCC)   Chronic kidney disease, stage 3a    COPD (chronic obstructive pulmonary disease) (HCC)   Pressure injury of skin    1.  Acute on chronic HFpEF  -Echocardiogram this admission revealing low normal LV systolic function with an EF estimated between 50-55%  -Continue Lasix injection 40mg  q 12 hours with close monitoring of renal function; creatinine improved from 1.61 to 1.37   -Continue daily ambulation with PT   2.  Elevated troponin   -High sensitivity troponin mildly elevated with no significant delta; likely demand ischemia in the setting of acute on chronic HFpEF, underlying COPD   -No further invasive workup indicated from a cardiac standpoint   3.  Coronary artery disease   -S/p PCI to RCA in  -Currently on aspirin and Plavix   -Continue metoprolol 50mg  once daily   4.  COPD   -Continue nebulizer treatments, supplemental O2  5.  Tobacco abuse   -Nicotine patch in place  -Counseling on smoking cessation provided   6.  Chronic kidney disease   -Creatinine 1.58 on arrival, currently 1.37 which appears at baseline  7.  Morbid obesity   -Had long discussion about the importance of weight loss through dietary modifications   -Would recommend home PT at discharge   The history, physical exam findings, and plan of care were all discussed with Dr. [id number], and all decision making was made in collaboration.    PA-C  07/13/2020 10:04 AM

## 2020-07-13 NOTE — Progress Notes (Signed)
Patient resting in the bed, remains alert and oriented, denies any pain, high flow oxygen at 5L [place] sat 93%vat this time no acute distress noted .

## 2020-07-13 NOTE — Progress Notes (Signed)
PROGRESS NOTE    Thomas Tanner   XLK:[id number]  DOB: May 08, 1951  PCP: [facility]    DOA: 07/10/2020 LOS: 3   Brief Narrative   Thomas Tanner is a 69 y.o. male with medical history significant for coronary artery disease with stents, severe obesity with BMI 57, COPD, tobacco abuse, prediabetes, CKD stage 3a, and chronic diastolic CHF, presented to the ED via EMS on 07/10/2020 with progressive shortness of breath, worsening lower extremity swelling and erythema (more on right LE, wound present).  Had been placed on Keflex as outpatient and notes marked improvement.  Reports history of MI and denies any similar symptoms or chest pain.  In the ED, afebrile, mildly tachypneic, saturating in the 90s on 10 L/min supplemental oxygen on NRB mask, BP 100/69.  Labs notable for Cr 1.58 (similar to prior), mild leukocytosis, hs-troponin 173>>191, BNP 421.  EKG normal sinus with nonspecific IVCD.  Chest xray showed vascular congestion and increased interstitial markings.  Treated in the ED with aspirin and started on heparin infusion.  Admitted to hospitalist service for further evaluation and management.    Assessment & Plan   Principal Problem:   Acute respiratory failure with hypoxia (HCC) Active Problems:   CAD in native artery   Acute on chronic diastolic CHF (congestive heart failure) (HCC)   Chronic kidney disease, stage 3a   COPD (chronic obstructive pulmonary disease) (HCC)   Pressure injury of skin  Acute respiratory failure with hypoxia due to Acute on chronic diastolic CHF - presented with SOB, significant peripheral edema with serous weeping, CXR findings consistent with CHF, new supplemental O2 requirement.  Echo in June 2021 showed EF 50-55%.  Continue IV Lasix 40 mg IV bid, beta blocker, ACEI as BP tolerates.  Monitor volume status with strict I/O's and daily weights.  Low sodium diet with fluid restriction. Consider V/Q scan to evaluate for PE  if hypoxia not improving with diuresis.  Patient quite active so PE less likely.  Of note, had negative CTA chest on 05/24/20.  LE erythema improving on Keflex makes DVT less likely.  Continue lasix 40 mg iv and change to po when appropriate.   Elevated troponin / Type II NSTEMI - presented with 1 day SOB, new O2 requirement, elevated troponin 173.  Likely demand ischemia in setting of acute CHF and hypoxic respiratory failure.  No chest pain, nausea, diaphoresis or acute ischemic changes on EKG.  Treated with full dose ASA and started on heparin gtt in the ED.  Telemetry.  Continue ASA, Plavix, stain.   Cardiology consulted.  Outpatient stress test per cardiology.   COPD - not acutely exacerbated on admission, only occasional wheezing.  Acute respiratory symptoms due to CHF more than COPD.  PRN bronchodilators.   - Acute respiratory sxs primarily secondary to CHF  - Nebs as needed    CKD Stage IIIa - Cr 1.58 on admission, stable.  Avoid nephrotoxins and hypotension.  Renally dose meds as indicated.  Monitor BMP. Creatinine has improved to 1.37 continue to monitor.   RLE cellulitis, present on admission, had been on Keflex outpatient with improvement, last day 8/3.  Not septic, tachypnea and leukocytosis more likely due to CHF.  Afebrile.  Monitor.  Tobacco abuse - reported he quit the day prior to admission.  Nicotine patch.  Counseled regarding importance   Morbid Obesity: Body mass index is 56.94 kg/m.  Significantly   DVT prophylaxis: enoxaparin (LOVENOX) injection 40 mg Start: 07/11/20 2200  Diet:  Diet Orders (From admission, onward)    Start     Ordered   07/11/20 0903  Diet heart healthy/carb modified Room service appropriate? Yes; Fluid consistency: Thin  Diet effective now       Question Answer Comment  Diet-HS Snack? Nothing   Room service appropriate? Yes   Fluid consistency: Thin      07/11/20 0902            Code Status: Full Code    Subjective 07/13/20     Patient seen this AM.  Says he's feeling better, shortness of breath and swelling are improving.  Denies fever/chills, chest pain, n/v or other acute complaints. Pt seen today and anticipate d/c tomorrow with transition to po lasix. He is doing much better. Pt states sob has resolved. Vitals are stable and labs are stable and creatinine has improved.   Disposition Plan & Communication   Status is: Inpatient  Remains appropriate for inpatient status due to severity of illness requiring IV therapies and significant O2 requirement.  IV diuresing.  Dispo: The patient is from: home              Anticipated d/c is to: home              Anticipated d/c date is: 3 days              Patient currently is NOT medically stable for d/c. Family Communication: none at bedside, will attempt to call   Consults, Procedures, Significant Events   Consultants:   Cardiology  Procedures:   None  Objective   Vitals:   07/12/20 2010 07/13/20 0507 07/13/20 0829 07/13/20 0837  BP: 126/75 116/68  (!) 113/53  Pulse: 88 98  (!) 102  Resp:    18  Temp: 98.7 F (37.1 C) 98.2 F (36.8 C)    TempSrc: Oral Oral    SpO2: 96% 96% 90% 98%  Weight:  (!) 195.8 kg    Height:        Intake/Output Summary (Last 24 hours) at 07/13/2020 1107 Last data filed at 07/13/2020 0855 Gross per 24 hour  Intake 840 ml  Output 5000 ml  Net -4160 ml   Filed Weights   07/10/20 1820 07/11/20 0212 07/13/20 0507  Weight: (!) 195 kg (!) 202.8 kg (!) 195.8 kg    Physical Exam: Blood pressure 127/66, pulse 95, temperature 98.2 F (36.8 C), temperature source Oral, resp. rate 18, height 6\' 1"  (1.854 m), weight (!) 195.8 kg, SpO2 94 %.   General exam: awake, alert, no acute distress, morbidly obese Respiratory system: dimished due to body habitus but overall clear, single expiratory wheeze appreciated, no rales or rhonchi, normal respiratory effort, on 8 L/min HFNC oxygen. Cardiovascular system: normal S1/S2, RRR,  bilateral LE pitting edema.   Central nervous system: A&O x3. no gross focal neurologic deficits, normal speech Extremities: moves all, BLE edema and venous stasis changes - edema is mildly improved Psychiatry: normal mood, congruent affect, judgement and insight appear normal  Labs   Data Reviewed: I have personally reviewed following labs and imaging studies  CBC: Recent Labs  Lab 07/10/20 1822 07/11/20 0336 07/12/20 0524 07/13/20 0636  WBC 12.1* 10.2 9.3 11.8*  HGB 15.0 13.8 13.0 14.3  HCT 42.1 38.5* 36.9* 40.3  MCV 93.3 94.8 95.3 91.4  PLT 289 246 241 259   Basic Metabolic Panel: Recent Labs  Lab 07/10/20 1822 07/11/20 0336 07/12/20 0524 07/13/20 0636  NA 138 135  135 136  K 4.6 4.8 4.5 4.6  CL 97* 99 96* 94*  CO2 27 26 28 30   GLUCOSE 147* 102* 194* 142*  BUN 34* 35* 37* 37*  CREATININE 1.58* 1.61* 1.47* 1.37*  CALCIUM 9.6 8.6* 9.0 9.0  MG  --  2.1  --   --    Coagulation Profile: Recent Labs  Lab 07/10/20 2050  INR 1.2     Recent Results (from the past 240 hour(s))  SARS Coronavirus 2 by RT PCR (hospital order, performed in [facility] hospital lab) Nasopharyngeal Nasopharyngeal Swab     Status: None   Collection Time: 07/10/20  8:50 PM   Specimen: Nasopharyngeal Swab  Result Value Ref Range Status   SARS Coronavirus 2 NEGATIVE NEGATIVE Final    Comment: (NOTE) SARS-CoV-2 target nucleic acids are NOT DETECTED.  The SARS-CoV-2 RNA is generally detectable in upper and lower respiratory specimens during the acute phase of infection. The lowest concentration of SARS-CoV-2 viral copies this assay can detect is 250 copies / mL. A negative result does not preclude SARS-CoV-2 infection and should not be used as the sole basis for treatment or other patient management decisions.  A negative result may occur with improper specimen collection / handling, submission of specimen other than nasopharyngeal swab, presence of viral mutation(s) within the areas  targeted by this assay, and inadequate number of viral copies (<250 copies / mL). A negative result must be combined with clinical observations, patient history, and epidemiological information.  Fact Sheet for Patients:   09/09/20  Fact Sheet for Healthcare Providers: BoilerBrush.com.cy  This test is not yet approved or  cleared by the https://pope.com/ FDA and has been authorized for detection and/or diagnosis of SARS-CoV-2 by FDA under an Emergency Use Authorization (EUA).  This EUA will remain in effect (meaning this test can be used) for the duration of the COVID-19 declaration under Section 564(b)(1) of the Act, 21 U.S.C. section 360bbb-3(b)(1), unless the authorization is terminated or revoked sooner.  Performed at [facility], [address] [place], [place]       Imaging Studies   No results found.   Medications   Scheduled Meds: . aspirin EC  81 mg Oral Daily  . clopidogrel  75 mg Oral Daily  . dextromethorphan-guaiFENesin  1 tablet Oral BID  . enoxaparin (LOVENOX) injection  40 mg Subcutaneous Q12H  . famotidine  20 mg Oral BID  . furosemide  40 mg Intravenous Q12H  . ipratropium-albuterol  3 mL Nebulization TID  . methylPREDNISolone (SOLU-MEDROL) injection  40 mg Intravenous BID  . metoprolol succinate  50 mg Oral Daily  . nicotine  21 mg Transdermal Daily  . rosuvastatin  20 mg Oral Daily   Continuous Infusions:      LOS: 3 days    Time spent: 25 minutes with > 50% spent in coordination of care and direct patient contact.    [id number], MD [facility] [phone] 07/13/2020, 11:07 AM  If 7PM-7AM, please contact night-coverage.  [How to contact the [facility] Attending or Consulting provider 7A - 7P or covering provider during after hours 7P -7A, for this patient?   Check the care team in [facility] and look for a) attending/consulting TR[facility] provider listed and b) the [facility] team  listed 1. Log into [email or web address] and use [facility] universal password to access. If you do not have the password, please contact the hospital operator. 2. Locate the [facility] provider you are looking for under  [facility] and page to a number that you can be directly reached. 3. If you still have difficulty reaching the provider, please page the [facility] (Director on Call) for the Hospitalists listed on amion for assistance.]

## 2020-07-13 NOTE — Plan of Care (Signed)
  Problem: Education: Goal: Knowledge of General Education information will improve Description: Including pain rating scale, medication(s)/side effects and non-pharmacologic comfort measures Outcome: Progressing   Problem: Clinical Measurements: Goal: Will remain free from infection Outcome: Progressing   Problem: Activity: Goal: Risk for activity intolerance will decrease Outcome: Progressing   

## 2020-07-13 NOTE — TOC Initial Note (Signed)
Transition of Care [facility]) - Initial/Assessment Note    Patient Details  Name: Thomas Tanner MRN: [id number] Date of Birth: 1951/07/06  [facility] CM/SW Contact:    Shelbie Ammons, RN Phone Number: 07/13/2020, 1:08 PM  Clinical Narrative:   RNCM met with patient at bedside, patient reports to feeling some better today. Patient reports that he doesn't feel like he has been up moving around as much as he should have since he has been here. He reports that the bed he is in is uncomfortable. He reports that when he was in the hospital in June he was supposed to have home health when he went home but no one ever came out. Patient reports that he has a cane, a regular walker and and rolling walker at home. He lives with his wife but both she and his son work and they are his transportation when he needs to go some where. Patient reports that he is seen at [facility] and he uses [facility] for any urgent medications he may need. Patient is agreeable to home health.  RNCM reached out to Cassie with [facility] and she can accept patient for PT/OT only, patient reports that these are what is important to him.                  Expected Discharge Plan: [place] Barriers to Discharge: Continued Medical Work up   Patient Goals and CMS Choice        Expected Discharge Plan and Services Expected Discharge Plan: [place]   Discharge Planning Services: CM Consult   Living arrangements for the past 2 months: Single Family Home                           HH Arranged: PT, OT [facility] Agency: [facility] Date [place]: 07/13/20 Time HH Agency Contacted: 1130 Representative spoke with at [place] Arrangements/Services Living arrangements for the past 2 months: [place] with:: Spouse Patient language and need for interpreter reviewed:: Yes Do you feel safe going back to the place where  you live?: Yes      Need for Family Participation in Patient Care: Yes (Comment) Care giver support system in place?: Yes (comment)   Criminal Activity/Legal Involvement Pertinent to Current Situation/Hospitalization: No - Comment as needed  Activities of Daily Living Home Assistive Devices/Equipment: Gilford Rile (specify type) ADL Screening (condition at time of admission) Patient's cognitive ability adequate to safely complete daily activities?: Yes Is the patient deaf or have difficulty hearing?: No Does the patient have difficulty seeing, even when wearing glasses/contacts?: No Does the patient have difficulty concentrating, remembering, or making decisions?: No Patient able to express need for assistance with ADLs?: Yes Does the patient have difficulty dressing or bathing?: Yes (past few days wife helps him bec. SOB) Independently performs ADLs?: No (as per pt wife helps) Communication: Independent Dressing (OT): Needs assistance Is this a change from baseline?: Change from baseline, expected to last <3days Grooming: Independent Is this a change from baseline?: Pre-admission baseline Feeding: Independent Bathing: Needs assistance Is this a change from baseline?: Pre-admission baseline Toileting: Needs assistance Is this a change from baseline?: Pre-admission baseline In/Out Bed: Needs assistance Is this a change from baseline?: Pre-admission baseline Walks in Home: Needs assistance Is this a change from baseline?: Pre-admission baseline Does the patient have difficulty walking or climbing stairs?:  Yes Weakness of Legs: None Weakness of Arms/Hands: None  Permission Sought/Granted                  Emotional Assessment Appearance:: Appears stated age Attitude/Demeanor/Rapport: Gracious, Engaged Affect (typically observed): Appropriate, Calm Orientation: : Oriented to Self, Oriented to Place, Oriented to  Time, Oriented to Situation Alcohol / Substance Use: Not  Applicable Psych Involvement: No (comment)  Admission diagnosis:  SOB (shortness of breath) [R06.02] Elevated troponin [R77.8] Acute respiratory failure with hypoxia ([place]) [J96.01] Patient Active Problem List   Diagnosis Date Noted  . Pressure injury of skin 07/12/2020  . Acute respiratory failure with hypoxia ([place]) 07/10/2020  . COPD (chronic obstructive pulmonary disease) ([place])   . Elevated troponin   . Acute on chronic diastolic CHF (congestive heart failure) ([place]) 05/24/2020  . Anasarca   . Chronic kidney disease, stage 3a   . Hyperlipidemia   . Tobacco abuse   . Shortness of breath 04/28/2020  . Upper respiratory tract infection due to COVID-19 virus 04/28/2020  . Edema 04/28/2020  . Encounter for general adult medical examination with abnormal findings 01/15/2019  . Acute non-recurrent maxillary sinusitis 01/15/2019  . Gastroesophageal reflux disease without esophagitis 01/15/2019  . CAD in native artery 12/16/2018  . Morbid obesity with BMI of 50.0-59.9, adult ([place]) 12/16/2018  . Moderate cigarette smoker 12/16/2018  . Chronic pain of right knee 08/04/2018  . Mild intermittent asthma 08/04/2018  . Impaired fasting glucose 04/03/2018  . Low back pain with right-sided sciatica 03/05/2018  . Chronic right-sided low back pain with right-sided sciatica 03/05/2018  . Pain in right hip 03/05/2018  . Hypertension 03/05/2018  . Foreign body in bladder and urethra 06/09/2013  . Kidney stone 06/09/2013  . Ureteric stone 06/09/2013  . Kidney stone 06/09/2013   PCP:  [facility], [place]:   [facility] [address] [place] [address] [place] ([place]) [place] [address] Phone: [phone] Fax: [place], [place] [address], [address] [place], [address] [place] [address] Phone: [phone] Fax: [phone]     Social Determinants of Health  (SDOH) Interventions    Readmission Risk Interventions No flowsheet data found.

## 2020-07-14 LAB — BASIC METABOLIC PANEL
Anion gap: 10 (ref 5–15)
BUN: 39 mg/dL — ABNORMAL HIGH (ref 8–23)
CO2: 34 mmol/L — ABNORMAL HIGH (ref 22–32)
Calcium: 9.3 mg/dL (ref 8.9–10.3)
Chloride: 91 mmol/L — ABNORMAL LOW (ref 98–111)
Creatinine, Ser: 1.4 mg/dL — ABNORMAL HIGH (ref 0.61–1.24)
GFR calc Af Amer: 60 mL/min — ABNORMAL LOW (ref >60)
GFR calc non Af Amer: 52 mL/min — ABNORMAL LOW (ref >60)
Glucose, Bld: 158 mg/dL — ABNORMAL HIGH (ref 70–99)
Potassium: 4.4 mmol/L (ref 3.5–5.1)
Sodium: 135 mmol/L (ref 135–145)

## 2020-07-14 LAB — CBC
HCT: 42.7 % (ref 39.0–52.0)
Hemoglobin: 15.2 g/dL (ref 13.0–17.0)
MCH: 32.9 pg (ref 26.0–34.0)
MCHC: 35.6 g/dL (ref 30.0–36.0)
MCV: 92.4 fL (ref 80.0–100.0)
Platelets: 267 10*3/uL (ref 150–400)
RBC: 4.62 MIL/uL (ref 4.22–5.81)
RDW: 13.2 % (ref 11.5–15.5)
WBC: 10.9 10*3/uL — ABNORMAL HIGH (ref 4.0–10.5)
nRBC: 0 % (ref 0.0–0.2)

## 2020-07-14 NOTE — Evaluation (Signed)
Physical Therapy Evaluation Patient Details Name: Thomas Tanner MRN: [id number] DOB: 01/31/1951 Today's Date: 07/14/2020   History of Present Illness  69 y.o. male with PMH that includes: COPD, DM, HTN, TIA, tachycardia, and CAD as well as COVID in April; he was here with leg swelling, weight gain, stomach and scrotum swelling and also shortness of breath ~6 weeks ago.  Now here with acute respiratory failure.  Clinical Impression  Pt willing to work with PT and actually did quite well.  He was able to go supine to sit w/o assist (heavy UE use on rails) but still needed assist to get back to bed.  He also showed good effort and strength with supine exercises, R more limited 2/2 chronic hip and knee pain.  Overall pt did quite well, and even on room air during ambulation maintained sats in the high 80s with no excessive shortness of breath or fatigue.     Follow Up Recommendations Home health PT    Equipment Recommendations  None recommended by PT    Recommendations for Other Services       Precautions / Restrictions Precautions Precautions: Fall Restrictions Weight Bearing Restrictions: No      Mobility  Bed Mobility Overal bed mobility: Needs Assistance Bed Mobility: Supine to Sit;Sit to Supine     Supine to sit: Min guard Sit to supine: Mod assist   General bed mobility comments: using rail pt was able to rise to sitting EOB w/o PT assist, did need assist to lift b/l LEs back into bed  Transfers Overall transfer level: Modified independent Equipment used: Rolling walker (2 wheeled)             General transfer comment: Pt was able to rise to standing with relative ease from bari-bed, not overly reliant on UEs but needing some momentum  Ambulation/Gait Ambulation/Gait assistance: Min guard Gait Distance (Feet): 50 Feet Assistive device: Rolling walker (2 wheeled)       General Gait Details: Pt able to do some in room ambulation loops with BRW.  He was  safe and though he had some minimal fatrigue he ultimately did well.  On room air (per pt request) his sats dropped only to 88% with the effort (reapplied O2 post ambulation)  Stairs            Wheelchair Mobility    Modified Rankin (Stroke Patients Only)       Balance Overall balance assessment: Needs assistance Sitting-balance support: Bilateral upper extremity supported Sitting balance-Leahy Scale: Good     Standing balance support: Bilateral upper extremity supported Standing balance-Leahy Scale: Fair Standing balance comment: Pt reliant on the walker, but able to maintain standing balance well and w/o assist                             Pertinent Vitals/Pain Pain Assessment: No/denies pain (minimal R hip/knee pain with exercises)    Home Living                        Prior Function                 Hand Dominance        Extremity/Trunk Assessment                Communication      Cognition Arousal/Alertness: Awake/alert Behavior During Therapy: WFL for tasks assessed/performed Overall Cognitive Status: Within Functional Limits for  tasks assessed                                        General Comments      Exercises General Exercises - Lower Extremity Heel Slides: Strengthening;10 reps (with resisted leg extensions, R weaker than L) Hip ABduction/ADduction: Strengthening;10 reps (mild pain increase on R, good L resistance) Straight Leg Raises: Strengthening;AAROM;10 reps (AAROM on R)   Assessment/Plan    PT Assessment    PT Problem List         PT Treatment Interventions      PT Goals (Current goals can be found in the Care Plan section)       Frequency Min 2X/week   Barriers to discharge        Co-evaluation               AM-PAC PT "6 Clicks" Mobility  Outcome Measure Help needed turning from your back to your side while in a flat bed without using bedrails?: A Little Help  needed moving from lying on your back to sitting on the side of a flat bed without using bedrails?: A Little Help needed moving to and from a bed to a chair (including a wheelchair)?: A Little Help needed standing up from a chair using your arms (e.g., wheelchair or bedside chair)?: A Little Help needed to walk in hospital room?: A Little Help needed climbing 3-5 steps with a railing? : A Lot 6 Click Score: 17    End of Session Equipment Utilized During Treatment: Gait belt;Oxygen Activity Tolerance: Patient tolerated treatment well Patient left: with call bell/phone within reach;in bed   PT Visit Diagnosis: Difficulty in walking, not elsewhere classified (R26.2);Muscle weakness (generalized) (M62.81)    Time: 6333-5456 PT Time Calculation (min) (ACUTE ONLY): 27 min   Charges:     PT Treatments $Gait Training: 8-22 mins $Therapeutic Exercise: 8-22 mins        Malachi Pro, DPT 07/14/2020, 5:48 PM

## 2020-07-14 NOTE — Plan of Care (Signed)

## 2020-07-14 NOTE — Progress Notes (Signed)
PROGRESS NOTE    Thomas Tanner   XTG:[id number]  DOB: 04-04-1951  PCP: [facility]    DOA: 07/10/2020 LOS: 4   Brief Narrative   Thomas Tanner is a 69 y.o. male with medical history significant for coronary artery disease with stents, severe obesity with BMI 57, COPD, tobacco abuse, prediabetes, CKD stage 3a, and chronic diastolic CHF, presented to the ED via EMS on 07/10/2020 with progressive shortness of breath, worsening lower extremity swelling and erythema (more on right LE, wound present).  Had been placed on Keflex as outpatient and notes marked improvement.  Reports history of MI and denies any similar symptoms or chest pain.  In the ED, afebrile, mildly tachypneic, saturating in the 90s on 10 L/min supplemental oxygen on NRB mask, BP 100/69.  Labs notable for Cr 1.58 (similar to prior), mild leukocytosis, hs-troponin 173>>191, BNP 421.  EKG normal sinus with nonspecific IVCD.  Chest xray showed vascular congestion and increased interstitial markings.  Treated in the ED with aspirin and started on heparin infusion.  Admitted to hospitalist service for further evaluation and management.    Assessment & Plan   Principal Problem:   Acute respiratory failure with hypoxia (HCC) Active Problems:   CAD in native artery   Acute on chronic diastolic CHF (congestive heart failure) (HCC)   Chronic kidney disease, stage 3a   COPD (chronic obstructive pulmonary disease) (HCC)   Pressure injury of skin  Acute respiratory failure with hypoxia due to Acute on chronic diastolic CHF - presented with SOB, significant peripheral edema with serous weeping, CXR findings consistent with CHF, new supplemental O2 requirement.  Echo in June 2021 showed EF 50-55%.  Continue IV Lasix 40 mg IV bid, beta blocker, ACEI as BP tolerates.  Monitor volume status with strict I/O's and daily weights.  Low sodium diet with fluid restriction. Consider V/Q scan to evaluate for PE  if hypoxia not improving with diuresis.  Patient quite active so PE less likely.  Of note, had negative CTA chest on 05/24/20.  LE erythema improving on Keflex makes DVT less likely.  Continue lasix 40 mg iv and change to po when appropriate.  Pt still sob and iv lasix is continued.  Output of -2950.   Elevated troponin / Type II NSTEMI - presented with 1 day SOB, new O2 requirement, elevated troponin 173.  Likely demand ischemia in setting of acute CHF and hypoxic respiratory failure.  No chest pain, nausea, diaphoresis or acute ischemic changes on EKG.  Treated with full dose ASA and started on heparin gtt in the ED.  Telemetry.  Continue ASA, Plavix, stain.   Cardiology consulted.  Outpatient stress test per cardiology.   COPD - not acutely exacerbated on admission, only occasional wheezing.  Acute respiratory symptoms due to CHF more than COPD.  PRN bronchodilators.   - Acute respiratory sxs primarily secondary to CHF  - Nebs as needed    CKD Stage IIIa - Cr 1.58 on admission, stable.  Avoid nephrotoxins and hypotension.  Renally dose meds as indicated.  Monitor BMP. Creatinine has improved to 1.37 continue to monitor.   RLE cellulitis, present on admission, had been on Keflex outpatient with improvement, last day 8/3.  Not septic, tachypnea and leukocytosis more likely due to CHF.  Afebrile.  Monitor.  Tobacco abuse - reported he quit the day prior to admission.  Nicotine patch.  Counseled regarding importance   Morbid Obesity: Body mass index is 56.94 kg/m.  Significantly  DVT prophylaxis: enoxaparin (LOVENOX) injection 40 mg Start: 07/11/20 2200   Diet:  Diet Orders (From admission, onward)    Start     Ordered   07/11/20 0903  Diet heart healthy/carb modified Room service appropriate? Yes; Fluid consistency: Thin  Diet effective now       Question Answer Comment  Diet-HS Snack? Nothing   Room service appropriate? Yes   Fluid consistency: Thin      07/11/20 0902             Code Status: Full Code    Subjective 07/14/20    Patient seen this AM.  Says he's feeling better, shortness of breath and swelling are improving.  Denies fever/chills, chest pain, n/v or other acute complaints. Pt seen today and anticipate d/c tomorrow with transition to po lasix. He is doing much better. Pt states sob has resolved. Vitals are stable and labs are stable and creatinine has improved.  07/14/20 Pt seen today he is still  C/O sob and has had good output today. Is working well with PT.  Disposition Plan & Communication   Status is: Inpatient  Remains appropriate for inpatient status due to severity of illness requiring IV therapies and significant O2 requirement.  IV diuresing.  Dispo: The patient is from: home              Anticipated d/c is to: home              Anticipated d/c date is: 3 days              Patient currently is NOT medically stable for d/c. Family Communication: none at bedside, will attempt to call   Consults, Procedures, Significant Events   Consultants:   Cardiology  Procedures:   None  Objective   Vitals:   07/14/20 0429 07/14/20 0537 07/14/20 0735 07/14/20 1152  BP:  117/81 (!) 124/59 (!) 145/76  Pulse:  89 89 85  Resp:  20 19 19   Temp:  (!) 97.5 F (36.4 C) 97.6 F (36.4 C)   TempSrc:  Oral Oral   SpO2: 92% 93% 93% 90%  Weight:      Height:        Intake/Output Summary (Last 24 hours) at 07/14/2020 1846 Last data filed at 07/14/2020 1350 Gross per 24 hour  Intake 240 ml  Output 2725 ml  Net -2485 ml   Filed Weights   07/10/20 1820 07/11/20 0212 07/13/20 0507  Weight: (!) 195 kg (!) 202.8 kg (!) 195.8 kg    Physical Exam: Blood pressure (!) 145/76, pulse 85, temperature 97.6 F (36.4 C), temperature source Oral, resp. rate 19, height 6\' 1"  (1.854 m), weight (!) 195.8 kg, SpO2 90 %.   General exam: awake, alert, no acute distress, morbidly obese Respiratory system: dimished due to body habitus but overall  clear, single expiratory wheeze appreciated, no rales or rhonchi, normal respiratory effort, on 8 L/min HFNC oxygen. Cardiovascular system: normal S1/S2, RRR, bilateral LE pitting edema.   Central nervous system: A&O x3. no gross focal neurologic deficits, normal speech Extremities: moves all, BLE edema and venous stasis changes - edema is mildly improved Psychiatry: normal mood, congruent affect, judgement and insight appear normal  Labs   Data Reviewed: I have personally reviewed following labs and imaging studies  CBC: Recent Labs  Lab 07/10/20 1822 07/11/20 0336 07/12/20 0524 07/13/20 0636 07/14/20 0528  WBC 12.1* 10.2 9.3 11.8* 10.9*  HGB 15.0 13.8 13.0 14.3 15.2  HCT  42.1 38.5* 36.9* 40.3 42.7  MCV 93.3 94.8 95.3 91.4 92.4  PLT 289 246 241 259 267   Basic Metabolic Panel: Recent Labs  Lab 07/10/20 1822 07/11/20 0336 07/12/20 0524 07/13/20 0636 07/14/20 0528  NA 138 135 135 136 135  K 4.6 4.8 4.5 4.6 4.4  CL 97* 99 96* 94* 91*  CO2 27 26 28 30  34*  GLUCOSE 147* 102* 194* 142* 158*  BUN 34* 35* 37* 37* 39*  CREATININE 1.58* 1.61* 1.47* 1.37* 1.40*  CALCIUM 9.6 8.6* 9.0 9.0 9.3  MG  --  2.1  --   --   --    Coagulation Profile: Recent Labs  Lab 07/10/20 2050  INR 1.2     Recent Results (from the past 240 hour(s))  SARS Coronavirus 2 by RT PCR (hospital order, performed in [facility] hospital lab) Nasopharyngeal Nasopharyngeal Swab     Status: None   Collection Time: 07/10/20  8:50 PM   Specimen: Nasopharyngeal Swab  Result Value Ref Range Status   SARS Coronavirus 2 NEGATIVE NEGATIVE Final    Comment: (NOTE) SARS-CoV-2 target nucleic acids are NOT DETECTED.  The SARS-CoV-2 RNA is generally detectable in upper and lower respiratory specimens during the acute phase of infection. The lowest concentration of SARS-CoV-2 viral copies this assay can detect is 250 copies / mL. A negative result does not preclude SARS-CoV-2 infection and should not be used as  the sole basis for treatment or other patient management decisions.  A negative result may occur with improper specimen collection / handling, submission of specimen other than nasopharyngeal swab, presence of viral mutation(s) within the areas targeted by this assay, and inadequate number of viral copies (<250 copies / mL). A negative result must be combined with clinical observations, patient history, and epidemiological information.  Fact Sheet for Patients:   09/09/20  Fact Sheet for Healthcare Providers: BoilerBrush.com.cy  This test is not yet approved or  cleared by the https://pope.com/ FDA and has been authorized for detection and/or diagnosis of SARS-CoV-2 by FDA under an Emergency Use Authorization (EUA).  This EUA will remain in effect (meaning this test can be used) for the duration of the COVID-19 declaration under Section 564(b)(1) of the Act, 21 U.S.C. section 360bbb-3(b)(1), unless the authorization is terminated or revoked sooner.  Performed at [facility], [address], [place]       Imaging Studies   No results found.   Medications   Scheduled Meds: . aspirin EC  81 mg Oral Daily  . clopidogrel  75 mg Oral Daily  . dextromethorphan-guaiFENesin  1 tablet Oral BID  . enoxaparin (LOVENOX) injection  40 mg Subcutaneous Q12H  . famotidine  20 mg Oral BID  . furosemide  40 mg Intravenous Q12H  . ipratropium-albuterol  3 mL Nebulization TID  . methylPREDNISolone (SOLU-MEDROL) injection  40 mg Intravenous BID  . metoprolol succinate  50 mg Oral Daily  . nicotine  21 mg Transdermal Daily  . rosuvastatin  20 mg Oral Daily   Continuous Infusions:None   LOS: 4 days    [id number], MD [facility] [phone] 07/14/2020, 6:46 PM  If 7PM-7AM, please contact night-coverage.  [How to contact the [facility] Attending or Consulting provider 7A - 7P or covering provider  during after hours 7P -7A, for this patient?   Check the care team in [facility] and look for a) attending/consulting [facility] provider listed and b) the [facility] team listed 1. Log into  [email or web address] and use [facility] universal password to access. If you do not have the password, please contact the hospital operator. 2. Locate the [facility] provider you are looking for under [facility] and page to a number that you can be directly reached. 3. If you still have difficulty reaching the provider, please page the [facility] (Director on Call) for the Hospitalists listed on amion for assistance.]

## 2020-07-14 NOTE — Progress Notes (Signed)
Patient Name: Thomas Tanner Date of Encounter: 07/14/2020  Hospital Problem List     Principal Problem:   Acute respiratory failure with hypoxia [facility]) Active Problems:   CAD in native artery   Acute on chronic diastolic CHF (congestive heart failure) (HCC)   Chronic kidney disease, stage 3a   COPD (chronic obstructive pulmonary disease) (HCC)   Pressure injury of skin    Patient Profile  Thomas Tanner is a 69 year old male with a past medical history significant for coronary artery disease s/p STEMI on 12/08/18 with PCI to RCA, COPD, history of a TIA, hyperlipidemia, type 2 diabetes, obstructive sleep apnea, hyperlipidemia, hypertension, and morbid obesity who presented to the ED on 07/10/20 for a 1 day history of shortness of breath.  Workup in the ED was significant for chest xray revealing vascular congestion with increased interstitial markings, ECG revealing sinus rhythm with a RBBB, high sensitivity troponin 173, 191, and 124 respectively, BNP of 421, and creatinine of 1.58.   07/12/20: Thomas Tanner reports significant improvement in shortness of breath, orthopnea, and PND.  He denies any recent or current episodes of chest pain or chest pressure.  Lower extremity swelling is improving with Compression boots and Lasix.  He has not ambulated yet today.   07/13/20:  He reports shortness of breath continues to improve. Continues to deny chest pain or chest pressure. Compression boots in place for lower extremity swelling.  Ambulated yesterday with physical therapy   07/14/20: Still sob but less so than previously. No chest pain  Subjective      Inpatient Medications    . aspirin EC  81 mg Oral Daily  . clopidogrel  75 mg Oral Daily  . dextromethorphan-guaiFENesin  1 tablet Oral BID  . enoxaparin (LOVENOX) injection  40 mg Subcutaneous Q12H  . famotidine  20 mg Oral BID  . furosemide  40 mg Intravenous Q12H  . ipratropium-albuterol  3 mL Nebulization TID  .  methylPREDNISolone (SOLU-MEDROL) injection  40 mg Intravenous BID  . metoprolol succinate  50 mg Oral Daily  . nicotine  21 mg Transdermal Daily  . rosuvastatin  20 mg Oral Daily    Vital Signs    Vitals:   07/14/20 0429 07/14/20 0537 07/14/20 0735 07/14/20 1152  BP:  117/81 (!) 124/59 (!) 145/76  Pulse:  89 89 85  Resp:  20 19 19   Temp:  (!) 97.5 F (36.4 C) 97.6 F (36.4 C)   TempSrc:  Oral Oral   SpO2: 92% 93% 93% 90%  Weight:      Height:        Intake/Output Summary (Last 24 hours) at 07/14/2020 1555 Last data filed at 07/14/2020 1350 Gross per 24 hour  Intake 240 ml  Output 2925 ml  Net -2685 ml   Filed Weights   07/10/20 1820 07/11/20 0212 07/13/20 0507  Weight: (!) 195 kg (!) 202.8 kg (!) 195.8 kg    Physical Exam    GEN: Well nourished, well developed, in no acute distress.  HEENT: normal.  Neck: Supple, no JVD, carotid bruits, or masses. Cardiac: RRR, no murmurs, rubs, or gallops. No clubbing, cyanosis, edema.  Radials/DP/PT 2+ and equal bilaterally.  Respiratory:  Respirations regular and unlabored, clear to auscultation bilaterally. GI: Morbidly obese MS: no deformity or atrophy. Skin: warm and dry, no rash. Neuro:  Strength and sensation are intact. Psych: Normal affect.  Labs    CBC Recent Labs    07/13/20 0636 07/14/20 09/13/20  WBC 11.8* 10.9*  HGB 14.3 15.2  HCT 40.3 42.7  MCV 91.4 92.4  PLT 259 267   Basic Metabolic Panel Recent Labs    19/50/93 0636 07/14/20 0528  NA 136 135  K 4.6 4.4  CL 94* 91*  CO2 30 34*  GLUCOSE 142* 158*  BUN 37* 39*  CREATININE 1.37* 1.40*  CALCIUM 9.0 9.3   Liver Function Tests No results for input(s): AST, ALT, ALKPHOS, BILITOT, PROT, ALBUMIN in the last 72 hours. No results for input(s): LIPASE, AMYLASE in the last 72 hours. Cardiac Enzymes No results for input(s): CKTOTAL, CKMB, CKMBINDEX, TROPONINI in the last 72 hours. BNP No results for input(s): BNP in the last 72 hours. D-Dimer No results  for input(s): DDIMER in the last 72 hours. Hemoglobin A1C No results for input(s): HGBA1C in the last 72 hours. Fasting Lipid Panel No results for input(s): CHOL, HDL, LDLCALC, TRIG, CHOLHDL, LDLDIRECT in the last 72 hours. Thyroid Function Tests No results for input(s): TSH, T4TOTAL, T3FREE, THYROIDAB in the last 72 hours.  Invalid input(s): FREET3  Telemetry    nsr  ECG    nsr  Radiology    DG Chest Port 1 View  Result Date: 07/10/2020 CLINICAL DATA:  Shortness of breath, CHF EXAM: PORTABLE CHEST 1 VIEW COMPARISON:  Radiograph 07/02/2020, CT 05/24/2020 FINDINGS: Mild central vascular congestion with diffuse Lea increased interstitial prominence similar to comparison studies accounting for differences in technique and inflation. No focally consolidative opacity. No pneumothorax or effusion. Cardiac silhouette is likely accentuated by portable technique. Overall, cardiomediastinal contours are similar to prior. No acute osseous or soft tissue abnormality. Telemetry leads overlie the chest. IMPRESSION: Vascular congestion with some increased interstitial markings, may reflect early interstitial edema/features of CHF. Electronically Signed   By: Kreg Shropshire M.D.   On: 07/10/2020 19:18   DG Chest Port 1 View  Result Date: 07/02/2020 CLINICAL DATA:  Fever.  Right leg/foot wounds 1 week. EXAM: PORTABLE CHEST 1 VIEW COMPARISON:  05/24/2020 and 12/08/2018 FINDINGS: Lordotic technique is demonstrated. Lungs are adequately inflated with minimal chronic prominence of the perihilar markings. No focal airspace consolidation or effusion. Cardiomediastinal silhouette and remainder the exam is unchanged. IMPRESSION: No acute cardiopulmonary disease Electronically Signed   By: Elberta Fortis M.D.   On: 07/02/2020 12:15   DG Foot Complete Right  Result Date: 07/02/2020 CLINICAL DATA:  Right foot pain/ulceration with weeping wounds 1 week. EXAM: RIGHT FOOT COMPLETE - 3+ VIEW COMPARISON:  None. FINDINGS:  Moderate soft tissue swelling over the plantar aspect as well as dorsal aspect of the mid to forefoot region. No air in the soft tissues. No acute fracture or dislocation. No bone destruction to suggest osteomyelitis. Mild degenerate changes over the midfoot. Moderate size inferior calcaneal spur. IMPRESSION: Soft tissue swelling over the mid to forefoot. No air in the soft tissues. No evidence of underlying osteomyelitis. Electronically Signed   By: Elberta Fortis M.D.   On: 07/02/2020 12:17    Assessment & Plan    Principal Problem:   Acute respiratory failure with hypoxia (HCC) Active Problems:   CAD in native artery   Acute on chronic diastolic CHF (congestive heart failure) (HCC)   Chronic kidney disease, stage 3a   COPD (chronic obstructive pulmonary disease) (HCC)   Pressure injury of skin    1.  Acute on chronic HFpEF             -Echocardiogram this admission revealing low normal LV systolic function with an  EF estimated between 50-55%             -Continue Lasix injection 40mg  q 12 hours with close monitoring of renal function; creatinine improved from 1.61 to 1.37              -Continue daily ambulation with PT   2.  Elevated troponin              -High sensitivity troponin mildly elevated with no significant delta; likely demand ischemia in the setting of acute on chronic HFpEF, underlying COPD              -No further invasive workup indicated from a cardiac standpoint   3.  Coronary artery disease              -S/p PCI to RCA in 2019             -Currently on aspirin and Plavix              -Continue metoprolol 50mg  once daily   4.  COPD              -Continue nebulizer treatments, supplemental O2  5.  Tobacco abuse              -Nicotine patch in place             -Counseling on smoking cessation provided   6.  Chronic kidney disease              -Creatinine 1.58 on arrival, currently 1.4 which appears at or near  baseline  7.  Morbid obesity               -Had long discussion about the importance of weight loss through dietary modifications              -Would recommend home PT at discharge   Signed, 2020. Jermiah Soderman MD 07/14/2020, 3:55 PM  Pager: [phone]

## 2020-07-14 NOTE — Care Management Important Message (Signed)
Important Message  Patient Details  Name: Thomas Tanner MRN: [id number] Date of Birth: [birth year]   Medicare Important Message Given:  Yes     Trenton Founds, RN 07/14/2020, 12:43 PM

## 2020-07-14 NOTE — Progress Notes (Signed)
Mobility Specialist - Progress Note   07/14/20 1222  Mobility  Activity Refused mobility  Mobility performed by Mobility specialist  $Mobility charge 1 Mobility    Pt declined mobility. Pt was finishing lunch at arrival and stated that he had just been up. Will attempt at another date.   Filiberto Pinks Mobility Specialist 07/14/20, 12:25 PM

## 2020-07-15 ENCOUNTER — Inpatient Hospital Stay: Payer: Medicare Other

## 2020-07-15 LAB — BRAIN NATRIURETIC PEPTIDE: B Natriuretic Peptide: 66.1 pg/mL (ref 0.0–100.0)

## 2020-07-15 LAB — CBC
HCT: 45 % (ref 39.0–52.0)
Hemoglobin: 15.9 g/dL (ref 13.0–17.0)
MCH: 31.9 pg (ref 26.0–34.0)
MCHC: 35.3 g/dL (ref 30.0–36.0)
MCV: 90.4 fL (ref 80.0–100.0)
Platelets: 265 10*3/uL (ref 150–400)
RBC: 4.98 MIL/uL (ref 4.22–5.81)
RDW: 13 % (ref 11.5–15.5)
WBC: 13 10*3/uL — ABNORMAL HIGH (ref 4.0–10.5)
nRBC: 0 % (ref 0.0–0.2)

## 2020-07-15 LAB — BASIC METABOLIC PANEL
Anion gap: 12 (ref 5–15)
BUN: 41 mg/dL — ABNORMAL HIGH (ref 8–23)
CO2: 32 mmol/L (ref 22–32)
Calcium: 9.2 mg/dL (ref 8.9–10.3)
Chloride: 89 mmol/L — ABNORMAL LOW (ref 98–111)
Creatinine, Ser: 1.56 mg/dL — ABNORMAL HIGH (ref 0.61–1.24)
GFR calc Af Amer: 52 mL/min — ABNORMAL LOW (ref >60)
GFR calc non Af Amer: 45 mL/min — ABNORMAL LOW (ref >60)
Glucose, Bld: 129 mg/dL — ABNORMAL HIGH (ref 70–99)
Potassium: 4.4 mmol/L (ref 3.5–5.1)
Sodium: 133 mmol/L — ABNORMAL LOW (ref 135–145)

## 2020-07-15 MED ORDER — FUROSEMIDE 40 MG PO TABS
40.0000 mg | ORAL_TABLET | Freq: Every day | ORAL | Status: DC
  Administered 2020-07-16 – 2020-07-18 (×3): 40 mg via ORAL
  Filled 2020-07-15 (×3): qty 1

## 2020-07-15 NOTE — Progress Notes (Signed)
Patient Name: Thomas Tanner Date of Encounter: 07/15/2020  Hospital Problem List     Principal Problem:   Acute respiratory failure with hypoxia [facility]) Active Problems:   CAD in native artery   Acute on chronic diastolic CHF (congestive heart failure) (HCC)   Chronic kidney disease, stage 3a   COPD (chronic obstructive pulmonary disease) (HCC)   Pressure injury of skin    Patient Profile     Thomas Tanner is a 69 year old male with a past medical history significant for coronary artery disease s/p STEMI on 12/08/18 with PCI to RCA, COPD, history of a TIA, hyperlipidemia, type 2 diabetes, obstructive sleep apnea, hyperlipidemia, hypertension, and morbid obesity who presented to the ED on 07/10/20 for a 1 day history of shortness of breath. Workup in the ED was significant for chest xray revealing vascular congestion with increased interstitial markings, ECG revealing sinus rhythm with a RBBB, high sensitivity troponin 173, 191, and 124 respectively, BNP of 421, and creatinine of 1.58.   07/12/20: Mr. Dilks reports significant improvement in shortness of breath, orthopnea, and PND. He denies any recent or current episodes of chest pain or chest pressure. Lower extremity swelling is improving with Compression boots and Lasix. He has not ambulated yet today.   07/13/20: He reports shortness of breath continues to improve. Continues to deny chest pain or chest pressure. Compression boots in place for lower extremity swelling. Ambulated yesterday with physical therapy   07/14/20: Still sob but less so than previously. No chest pain  07/15/20: Somewhat less Short of Breath.  Unna boots in place  Subjective   Short of breath but slightly improved  Inpatient Medications     aspirin EC  81 mg Oral Daily   clopidogrel  75 mg Oral Daily   dextromethorphan-guaiFENesin  1 tablet Oral BID   enoxaparin (LOVENOX) injection  40 mg Subcutaneous Q12H   famotidine  20 mg Oral BID    furosemide  40 mg Intravenous Q12H   ipratropium-albuterol  3 mL Nebulization TID   methylPREDNISolone (SOLU-MEDROL) injection  40 mg Intravenous BID   metoprolol succinate  50 mg Oral Daily   nicotine  21 mg Transdermal Daily   rosuvastatin  20 mg Oral Daily    Vital Signs    Vitals:   07/14/20 1937 07/14/20 2003 07/15/20 0400 07/15/20 0738  BP: 120/69  126/67 124/74  Pulse: 84  80 82  Resp: 16  18 17   Temp: 97.7 F (36.5 C)  97.7 F (36.5 C) (!) 97.5 F (36.4 C)  TempSrc: Oral   Oral  SpO2: 93% 92% 95% 93%  Weight:   (!) 195.9 kg   Height:        Intake/Output Summary (Last 24 hours) at 07/15/2020 0854 Last data filed at 07/15/2020 0700 Gross per 24 hour  Intake 720 ml  Output 4400 ml  Net -3680 ml   Filed Weights   07/11/20 0212 07/13/20 0507 07/15/20 0400  Weight: (!) 202.8 kg (!) 195.8 kg (!) 195.9 kg    Physical Exam    GEN: Morbidly obese Caucasian male.  HEENT: normal.  Neck: Supple, no JVD, carotid bruits, or masses. Cardiac: RRR,  Respiratory:  Respirations regular and unlabored. GI: Not able to assess due to body habitus MS: no deformity or atrophy. Skin: warm and dry, no rash. Neuro:  Strength and sensation are intact. Psych: Normal affect.  Labs    CBC Recent Labs    07/14/20 0528 07/15/20 0541  WBC 10.9* 13.0*  HGB 15.2 15.9  HCT 42.7 45.0  MCV 92.4 90.4  PLT 267 265   Basic Metabolic Panel Recent Labs    16/10/96 0528 07/15/20 0541  NA 135 133*  K 4.4 4.4  CL 91* 89*  CO2 34* 32  GLUCOSE 158* 129*  BUN 39* 41*  CREATININE 1.40* 1.56*  CALCIUM 9.3 9.2   Liver Function Tests No results for input(s): AST, ALT, ALKPHOS, BILITOT, PROT, ALBUMIN in the last 72 hours. No results for input(s): LIPASE, AMYLASE in the last 72 hours. Cardiac Enzymes No results for input(s): CKTOTAL, CKMB, CKMBINDEX, TROPONINI in the last 72 hours. BNP Recent Labs    07/15/20 0541  BNP 66.1   D-Dimer No results for input(s): DDIMER in the  last 72 hours. Hemoglobin A1C No results for input(s): HGBA1C in the last 72 hours. Fasting Lipid Panel No results for input(s): CHOL, HDL, LDLCALC, TRIG, CHOLHDL, LDLDIRECT in the last 72 hours. Thyroid Function Tests No results for input(s): TSH, T4TOTAL, T3FREE, THYROIDAB in the last 72 hours.  Invalid input(s): FREET3  Telemetry    Normal sinus rhythm  ECG    Normal sinus rhythm with no ischemia  Radiology    DG Chest 1 View  Result Date: 07/15/2020 CLINICAL DATA:  69 year old male with shortness of breath.  Smoker. EXAM: CHEST  1 VIEW COMPARISON:  Portable chest 07/10/2020 and earlier. FINDINGS: Portable AP upright view at 0745 hours. Stable lung volumes and mediastinal contours. Visualized tracheal air column is within normal limits. No pneumothorax, pleural effusion or consolidation. Widespread increased pulmonary interstitium, which appears mostly chronic when compared to radiographs back to 2014. Visualized tracheal air column is within normal limits. Stable visualized osseous structures. IMPRESSION: Chronic pulmonary interstitial changes. No acute cardiopulmonary abnormality. Electronically Signed   By: Odessa Fleming M.D.   On: 07/15/2020 07:55   DG Chest Port 1 View  Result Date: 07/10/2020 CLINICAL DATA:  Shortness of breath, CHF EXAM: PORTABLE CHEST 1 VIEW COMPARISON:  Radiograph 07/02/2020, CT 05/24/2020 FINDINGS: Mild central vascular congestion with diffuse Lea increased interstitial prominence similar to comparison studies accounting for differences in technique and inflation. No focally consolidative opacity. No pneumothorax or effusion. Cardiac silhouette is likely accentuated by portable technique. Overall, cardiomediastinal contours are similar to prior. No acute osseous or soft tissue abnormality. Telemetry leads overlie the chest. IMPRESSION: Vascular congestion with some increased interstitial markings, may reflect early interstitial edema/features of CHF. Electronically  Signed   By: Kreg Shropshire M.D.   On: 07/10/2020 19:18   DG Chest Port 1 View  Result Date: 07/02/2020 CLINICAL DATA:  Fever.  Right leg/foot wounds 1 week. EXAM: PORTABLE CHEST 1 VIEW COMPARISON:  05/24/2020 and 12/08/2018 FINDINGS: Lordotic technique is demonstrated. Lungs are adequately inflated with minimal chronic prominence of the perihilar markings. No focal airspace consolidation or effusion. Cardiomediastinal silhouette and remainder the exam is unchanged. IMPRESSION: No acute cardiopulmonary disease Electronically Signed   By: Elberta Fortis M.D.   On: 07/02/2020 12:15   DG Foot Complete Right  Result Date: 07/02/2020 CLINICAL DATA:  Right foot pain/ulceration with weeping wounds 1 week. EXAM: RIGHT FOOT COMPLETE - 3+ VIEW COMPARISON:  None. FINDINGS: Moderate soft tissue swelling over the plantar aspect as well as dorsal aspect of the mid to forefoot region. No air in the soft tissues. No acute fracture or dislocation. No bone destruction to suggest osteomyelitis. Mild degenerate changes over the midfoot. Moderate size inferior calcaneal spur. IMPRESSION: Soft tissue swelling over  the mid to forefoot. No air in the soft tissues. No evidence of underlying osteomyelitis. Electronically Signed   By: Elberta Fortis M.D.   On: 07/02/2020 12:17    Assessment & Plan    Principal Problem: Acute respiratory failure with hypoxia (HCC) Active Problems: CAD in native artery Acute on chronic diastolic CHF (congestive heart failure) (HCC) Chronic kidney disease, stage 3a COPD (chronic obstructive pulmonary disease) (HCC) Pressure injury of skin   1. Acute on chronic HFpEF -Echocardiogram this admission revealing low normal LV systolic function with an EF estimated between 50-55% -We will change Lasix to 40 mg p.o. twice daily. -Continue daily ambulation with PT  -BNP down to normal at 66.1 this morning.  Was 421 5 days ago.  Creatinine somewhat  increased over yesterday.  May be slightly over diuresed.  Will hold Lasix today and resume at 40 mg daily when renal function stabilizes.  2. Elevated troponin  -High sensitivity troponin mildly elevated with no significant delta; likely demand ischemia in the setting of acute on chronic HFpEF, underlying COPD  -No further invasive workup indicated from a cardiac standpoint   3. Coronary artery disease  -S/p PCI to RCA in 2019 -Currently on aspirin and Plavix  -Continue metoprolol 50mg  once daily   4. COPD  -Continue nebulizer treatments, supplemental O2  5. Tobacco abuse  -Nicotine patch in place -Counseling on smoking cessation provided   6. Chronic kidney disease  -Creatinine 1.58 on arrival, currently 1.56.  7. Morbid obesity  -Weight loss -Would recommend home PT at discharge   Signed, . Curtisha Bendix MD 07/15/2020, 8:54 AM  Pager: [phone]

## 2020-07-15 NOTE — Plan of Care (Signed)

## 2020-07-15 NOTE — Progress Notes (Signed)
Mobility Specialist - Progress Note   07/15/20 1240  Mobility  Activity  (Bed Exercises)  Range of Motion/Exercises Right leg;Left leg (Ankle pumps, quad/gluteal sets, straight leg raises, hip add)  Level of Assistance Moderate assist, patient does 50-74%  Assistive Device None  Distance Ambulated (ft) 0 ft  Mobility Response Tolerated well  Mobility performed by Mobility specialist  $Mobility charge 1 Mobility    Pre-mobility: 83 HR, 116/62 BP, 94% SpO2 Post-mobility: 87 HR, 116/75 BP, 93% SpO2   Pt was lying in bed on arrival and sitting at 3L HFNC. Pt agreed to bed exercises (quad sets, gluteal sets, straight leg raises, ankle pumps, and hip abd/add.). Pt stated that he "experiences pain in R leg but this is not something that's new". Pain in R leg did limit pt's range of mobility during straight leg raises and hip abduction. Pt denied any SOB. Pt expressed that he felt motivated after session. Overall, pt tolerated session well. Pt remains in bed with phone and call bell in reach. Nurse was notified.   Filiberto Pinks Mobility Specialist 07/15/20, 12:56 PM

## 2020-07-15 NOTE — Progress Notes (Addendum)
PROGRESS NOTE    Thomas Tanner   HBZ:[id number]  DOB: [birth year]  PCP: [facility]    DOA: 07/10/2020 LOS: 5   Brief Narrative   Thomas Tanner is a 69 y.o. male with medical history significant for coronary artery disease with stents, severe obesity with BMI 57, COPD, tobacco abuse, prediabetes, CKD stage 3a, and chronic diastolic CHF, presented to the ED via EMS on 07/10/2020 with progressive shortness of breath, worsening lower extremity swelling and erythema (more on right LE, wound present).  Had been placed on Keflex as outpatient and notes marked improvement.  Reports history of MI and denies any similar symptoms or chest pain.  In the ED, afebrile, mildly tachypneic, saturating in the 90s on 10 L/min supplemental oxygen on NRB mask, BP 100/69.  Labs notable for Cr 1.58 (similar to prior), mild leukocytosis, hs-troponin 173>>191, BNP 421.  EKG normal sinus with nonspecific IVCD.  Chest xray showed vascular congestion and increased interstitial markings.  Treated in the ED with aspirin and started on heparin infusion.  Admitted to hospitalist service for further evaluation and management.    Assessment & Plan   Principal Problem:   Acute respiratory failure with hypoxia (HCC) Active Problems:   CAD in native artery   Acute on chronic diastolic CHF (congestive heart failure) (HCC)   Chronic kidney disease, stage 3a   COPD (chronic obstructive pulmonary disease) (HCC)   Pressure injury of skin  Acute respiratory failure with hypoxia due to Acute on chronic diastolic CHF - presented with SOB, significant peripheral edema with serous weeping, CXR findings consistent with CHF, new supplemental O2 requirement.  Echo in June 2021 showed EF 50-55%.  Continue IV Lasix 40 mg IV bid, beta blocker, ACEI as BP tolerates.  Monitor volume status with strict I/O's and daily weights.  Low sodium diet with fluid restriction. Consider V/Q scan to evaluate for PE  if hypoxia not improving with diuresis.  Patient quite active so PE less likely.  Of note, had negative CTA chest on 05/24/20.  LE erythema improving on Keflex makes DVT less likely.  Continue lasix 40 mg iv and change to po when appropriate.  Pt still sob and iv lasix is continued.  Output of -2950.  Lasix changed to po as creatinine has gone up to 1.56.  Appreciate Cardiology management and careplan. Gross per 24 hour  Intake 720 ml  Output 4400 ml  Net -3680 ml    Elevated troponin / Type II NSTEMI - presented with 1 day SOB, new O2 requirement, elevated troponin 173.  Likely demand ischemia in setting of acute CHF and hypoxic respiratory failure.  No chest pain, nausea, diaphoresis or acute ischemic changes on EKG.  Treated with full dose ASA and started on heparin gtt in the ED.  Telemetry.  Continue ASA, Plavix, stain.   Cardiology consulted.  Outpatient stress test per cardiology, no inpatient workup.    COPD - not acutely exacerbated on admission, only occasional wheezing.  Acute respiratory symptoms due to CHF more than COPD.  PRN bronchodilators.   - Acute respiratory sxs primarily secondary to CHF  - Nebs as needed  currently stable.   CKD Stage IIIa - Cr 1.58 on admission, stable.  Avoid nephrotoxins and hypotension.  Renally dose meds as indicated.  Monitor BMP. Creatinine has improved to 1.37 continue to monitor.  Currently creatinine has increased to 1.56 and we will continue to monitor.   RLE cellulitis, present on admission, had been  on Keflex outpatient with improvement, last day 8/3.  Not septic, tachypnea and leukocytosis more likely due to CHF.  Afebrile.  Monitor.  Tobacco abuse - reported he quit the day prior to admission.  Nicotine patch.  Counseled regarding importance .  Morbid Obesity: Body mass index is 56.98 kg/m.  Significantly   DVT prophylaxis: enoxaparin (LOVENOX) injection 40 mg Start: 07/11/20 2200   Diet:  Diet Orders (From admission,  onward)    Start     Ordered   07/11/20 0903  Diet heart healthy/carb modified Room service appropriate? Yes; Fluid consistency: Thin  Diet effective now       Question Answer Comment  Diet-HS Snack? Nothing   Room service appropriate? Yes   Fluid consistency: Thin      07/11/20 0902            Code Status: Full Code    Subjective 07/15/20    Patient seen this AM.  Says he's feeling better, shortness of breath and swelling are improving.  Denies fever/chills, chest pain, n/v or other acute complaints. Pt seen today and anticipate d/c tomorrow with transition to po lasix. He is doing much better. Pt states sob has resolved. Vitals are stable and labs are stable and creatinine has improved.  07/14/20 Pt seen today he is still  C/O sob and has had good output today. Is working well with PT.  07/15/20 Pt is seen today he is  Doing better chest xray shows improvement.  Creatinine has gone up and iv lasix is changed to po. Pt denies any Complaints.  Expect d/c in am.   07/16/20 Pt is awake and coughing some and is on oxygen he takes the [place] off intermittently.  Pt's creatinine is improved and we will  Cont lasix po from today afternoon , start pot pn p o azithromycin and expect d/c in am.Change from solumedrol to medrol dose pak.    Disposition Plan & Communication   Status is: Inpatient  Remains appropriate for inpatient status due to severity of illness requiring IV therapies and significant O2 requirement.  IV diuresing.  Dispo: The patient is from: home              Anticipated d/c is to: home              Anticipated d/c date is: 3 days              Patient currently is NOT medically stable for d/c. Family Communication: none at bedside, will attempt to call   Consults, Procedures, Significant Events   Consultants:   Cardiology  Procedures:   None  Objective   Vitals:   07/15/20 0400 07/15/20 0738 07/15/20 1136 07/15/20 1523  BP: 126/67 124/74 131/71 129/66   Pulse: 80 82 86 82  Resp: 18 17 18 18   Temp: 97.7 F (36.5 C) (!) 97.5 F (36.4 C) 97.8 F (36.6 C) 97.8 F (36.6 C)  TempSrc:  Oral Oral   SpO2: 95% 93% 93% 93%  Weight: (!) 195.9 kg     Height:        Intake/Output Summary (Last 24 hours) at 07/15/2020 1828 Last data filed at 07/15/2020 1802 Gross per 24 hour  Intake 960 ml  Output 5825 ml  Net -4865 ml   Filed Weights   07/11/20 0212 07/13/20 0507 07/15/20 0400  Weight: (!) 202.8 kg (!) 195.8 kg (!) 195.9 kg    Physical Exam: Blood pressure 129/66, pulse 82, temperature 97.8 F (  36.6 C), resp. rate 18, height 6\' 1"  (1.854 m), weight (!) 195.9 kg, SpO2 93 %.   General exam: awake, alert, no acute distress, morbidly obese Respiratory system: dimished due to body habitus but overall clear, single expiratory wheeze appreciated, no rales or rhonchi, normal respiratory effort, on 8 L/min HFNC oxygen. Cardiovascular system: normal S1/S2, RRR, bilateral LE pitting edema.   Central nervous system: A&O x3. no gross focal neurologic deficits, normal speech Extremities: moves all, BLE edema and venous stasis changes - edema is mildly improved Psychiatry: normal mood, congruent affect, judgement and insight appear normal  Labs   Data Reviewed: I have personally reviewed following labs and imaging studies  CBC: Recent Labs  Lab 07/11/20 0336 07/12/20 0524 07/13/20 0636 07/14/20 0528 07/15/20 0541  WBC 10.2 9.3 11.8* 10.9* 13.0*  HGB 13.8 13.0 14.3 15.2 15.9  HCT 38.5* 36.9* 40.3 42.7 45.0  MCV 94.8 95.3 91.4 92.4 90.4  PLT 246 241 259 267 265   Basic Metabolic Panel: Recent Labs  Lab 07/11/20 0336 07/12/20 0524 07/13/20 0636 07/14/20 0528 07/15/20 0541  NA 135 135 136 135 133*  K 4.8 4.5 4.6 4.4 4.4  CL 99 96* 94* 91* 89*  CO2 26 28 30  34* 32  GLUCOSE 102* 194* 142* 158* 129*  BUN 35* 37* 37* 39* 41*  CREATININE 1.61* 1.47* 1.37* 1.40* 1.56*  CALCIUM 8.6* 9.0 9.0 9.3 9.2  MG 2.1  --   --   --   --     Coagulation Profile: Recent Labs  Lab 07/10/20 2050  INR 1.2     Recent Results (from the past 240 hour(s))  SARS Coronavirus 2 by RT PCR (hospital order, performed in [facility] hospital lab) Nasopharyngeal Nasopharyngeal Swab     Status: None   Collection Time: 07/10/20  8:50 PM   Specimen: Nasopharyngeal Swab  Result Value Ref Range Status   SARS Coronavirus 2 NEGATIVE NEGATIVE Final    Comment: (NOTE) SARS-CoV-2 target nucleic acids are NOT DETECTED.  The SARS-CoV-2 RNA is generally detectable in upper and lower respiratory specimens during the acute phase of infection. The lowest concentration of SARS-CoV-2 viral copies this assay can detect is 250 copies / mL. A negative result does not preclude SARS-CoV-2 infection and should not be used as the sole basis for treatment or other patient management decisions.  A negative result may occur with improper specimen collection / handling, submission of specimen other than nasopharyngeal swab, presence of viral mutation(s) within the areas targeted by this assay, and inadequate number of viral copies (<250 copies / mL). A negative result must be combined with clinical observations, patient history, and epidemiological information.  Fact Sheet for Patients:   [facility]  Fact Sheet for Healthcare Providers: 09/09/20  This test is not yet approved or  cleared by the [employer] FDA and has been authorized for detection and/or diagnosis of SARS-CoV-2 by FDA under an Emergency Use Authorization (EUA).  This EUA will remain in effect (meaning this test can be used) for the duration of the COVID-19 declaration under Section 564(b)(1) of the Act, 21 U.S.C. section 360bbb-3(b)(1), unless the authorization is terminated or revoked sooner.  Performed at [facility], [address], [place], [address] [place]       Imaging Studies   DG Chest 1  View  Result Date: 07/15/2020 CLINICAL DATA:  69 year old male with shortness of breath.  Smoker. EXAM: CHEST  1 VIEW COMPARISON:  Portable chest 07/10/2020 and earlier. FINDINGS:  Portable AP upright view at 0745 hours. Stable lung volumes and mediastinal contours. Visualized tracheal air column is within normal limits. No pneumothorax, pleural effusion or consolidation. Widespread increased pulmonary interstitium, which appears mostly chronic when compared to radiographs back to 2014. Visualized tracheal air column is within normal limits. Stable visualized osseous structures. IMPRESSION: Chronic pulmonary interstitial changes. No acute cardiopulmonary abnormality. Electronically Signed   By: Odessa FlemingH  Hall M.D.   On: 07/15/2020 07:55     Medications   Scheduled Meds: . aspirin EC  81 mg Oral Daily  . clopidogrel  75 mg Oral Daily  . dextromethorphan-guaiFENesin  1 tablet Oral BID  . enoxaparin (LOVENOX) injection  40 mg Subcutaneous Q12H  . famotidine  20 mg Oral BID  . [START ON 07/16/2020] furosemide  40 mg Oral Daily  . ipratropium-albuterol  3 mL Nebulization TID  . methylPREDNISolone (SOLU-MEDROL) injection  40 mg Intravenous BID  . metoprolol succinate  50 mg Oral Daily  . nicotine  21 mg Transdermal Daily  . rosuvastatin  20 mg Oral Daily   Continuous Infusions:None   LOS: 5 days    Gertha CalkinEkta V Vala Raffo, MD [facility] [phone] 07/15/2020, 6:28 PM  If 7PM-7AM, please contact night-coverage.  [How to contact the [facility] Attending or Consulting provider 7A - 7P or covering provider during after hours 7P -7A, for this patient?   Check the care team in [facility] and look for a) attending/consulting TR[facility] provider listed and b) the [facility] team listed 1. Log into [email or web address] and use [facility] universal password to access. If you do not have the password, please contact the hospital operator. 2. Locate the [facility] provider you are looking for under [facility] and page to a number that you can  be directly reached. 3. If you still have difficulty reaching the provider, please page the [facility] (Director on Call) for the Hospitalists listed on amion for assistance.]

## 2020-07-15 NOTE — TOC Progression Note (Signed)
Transition of Care [facility]) - Progression Note    Patient Details  Name: Thomas Tanner MRN: [id number] Date of Birth: 1951-11-15  [facility] CM/SW Contact  Trenton Founds, RN Phone Number: 07/15/2020, 11:19 AM  Clinical Narrative:   RNCM late entry for 07/14/2020. Barbara Cower with [facility] is able to accept referral for home health services.     Expected Discharge Plan: Home w Home Health Services Barriers to Discharge: Continued Medical Work up  Expected Discharge Plan and Services Expected Discharge Plan: Home w Home Health Services   Discharge Planning Services: CM Consult   Living arrangements for the past 2 months: Single Family Home                           HH Arranged: PT, OT [facility] Agency: [facility] Date [facility] Agency Contacted: 07/13/20 Time HH Agency Contacted: [phone] Representative spoke with at [facility] Agency: Cassie   Social Determinants of Health (SDOH) Interventions    Readmission Risk Interventions No flowsheet data found.

## 2020-07-16 LAB — CBC WITH DIFFERENTIAL/PLATELET
Abs Immature Granulocytes: 0.05 10*3/uL (ref 0.00–0.07)
Basophils Absolute: 0 10*3/uL (ref 0.0–0.1)
Basophils Relative: 0 %
Eosinophils Absolute: 0 10*3/uL (ref 0.0–0.5)
Eosinophils Relative: 0 %
HCT: 48.7 % (ref 39.0–52.0)
Hemoglobin: 16.2 g/dL (ref 13.0–17.0)
Immature Granulocytes: 0 %
Lymphocytes Relative: 11 %
Lymphs Abs: 1.5 10*3/uL (ref 0.7–4.0)
MCH: 31.1 pg (ref 26.0–34.0)
MCHC: 33.3 g/dL (ref 30.0–36.0)
MCV: 93.5 fL (ref 80.0–100.0)
Monocytes Absolute: 1.2 10*3/uL — ABNORMAL HIGH (ref 0.1–1.0)
Monocytes Relative: 9 %
Neutro Abs: 10.9 10*3/uL — ABNORMAL HIGH (ref 1.7–7.7)
Neutrophils Relative %: 80 %
Platelets: 269 10*3/uL (ref 150–400)
RBC: 5.21 MIL/uL (ref 4.22–5.81)
RDW: 13 % (ref 11.5–15.5)
WBC: 13.7 10*3/uL — ABNORMAL HIGH (ref 4.0–10.5)
nRBC: 0 % (ref 0.0–0.2)

## 2020-07-16 LAB — COMPREHENSIVE METABOLIC PANEL
ALT: 16 U/L (ref 0–44)
AST: 14 U/L — ABNORMAL LOW (ref 15–41)
Albumin: 4 g/dL (ref 3.5–5.0)
Alkaline Phosphatase: 64 U/L (ref 38–126)
Anion gap: 14 (ref 5–15)
BUN: 42 mg/dL — ABNORMAL HIGH (ref 8–23)
CO2: 31 mmol/L (ref 22–32)
Calcium: 9.4 mg/dL (ref 8.9–10.3)
Chloride: 89 mmol/L — ABNORMAL LOW (ref 98–111)
Creatinine, Ser: 1.47 mg/dL — ABNORMAL HIGH (ref 0.61–1.24)
GFR calc Af Amer: 56 mL/min — ABNORMAL LOW (ref >60)
GFR calc non Af Amer: 49 mL/min — ABNORMAL LOW (ref >60)
Glucose, Bld: 131 mg/dL — ABNORMAL HIGH (ref 70–99)
Potassium: 4.4 mmol/L (ref 3.5–5.1)
Sodium: 134 mmol/L — ABNORMAL LOW (ref 135–145)
Total Bilirubin: 0.9 mg/dL (ref 0.3–1.2)
Total Protein: 7.4 g/dL (ref 6.5–8.1)

## 2020-07-16 LAB — MAGNESIUM: Magnesium: 2.3 mg/dL (ref 1.7–2.4)

## 2020-07-16 MED ORDER — METHYLPREDNISOLONE 4 MG PO TBPK: 8.0000 mg | ORAL_TABLET | Freq: Every evening | ORAL | Status: DC

## 2020-07-16 MED ORDER — METHYLPREDNISOLONE 4 MG PO TABS
20.0000 mg | ORAL_TABLET | Freq: Every day | ORAL | Status: AC
  Administered 2020-07-17: 20 mg via ORAL
  Filled 2020-07-16: qty 5

## 2020-07-16 MED ORDER — METHYLPREDNISOLONE 4 MG PO TBPK: 8.0000 mg | ORAL_TABLET | Freq: Every morning | ORAL | Status: DC

## 2020-07-16 MED ORDER — METHYLPREDNISOLONE 4 MG PO TABS
16.0000 mg | ORAL_TABLET | Freq: Every day | ORAL | Status: AC
  Administered 2020-07-18: 16 mg via ORAL
  Filled 2020-07-16: qty 4

## 2020-07-16 MED ORDER — AZITHROMYCIN 250 MG PO TABS
500.0000 mg | ORAL_TABLET | Freq: Every day | ORAL | Status: DC
  Administered 2020-07-17 – 2020-07-18 (×2): 500 mg via ORAL
  Filled 2020-07-16 (×2): qty 2

## 2020-07-16 MED ORDER — METHYLPREDNISOLONE 4 MG PO TBPK: 4.0000 mg | ORAL_TABLET | ORAL | Status: DC

## 2020-07-16 MED ORDER — METHYLPREDNISOLONE 4 MG PO TABS
8.0000 mg | ORAL_TABLET | Freq: Every day | ORAL | Status: DC
  Filled 2020-07-16: qty 2

## 2020-07-16 MED ORDER — METHYLPREDNISOLONE 4 MG PO TABS
24.0000 mg | ORAL_TABLET | Freq: Once | ORAL | Status: AC
  Administered 2020-07-17: 24 mg via ORAL
  Filled 2020-07-16: qty 6

## 2020-07-16 MED ORDER — SODIUM CHLORIDE 0.9 % IV SOLN: 500.0000 mg | INTRAVENOUS | Status: DC

## 2020-07-16 MED ORDER — METHYLPREDNISOLONE 4 MG PO TBPK: 4.0000 mg | ORAL_TABLET | Freq: Four times a day (QID) | ORAL | Status: DC

## 2020-07-16 MED ORDER — METHYLPREDNISOLONE 4 MG PO TABS
4.0000 mg | ORAL_TABLET | Freq: Every day | ORAL | Status: DC
  Filled 2020-07-16: qty 1

## 2020-07-16 MED ORDER — SODIUM CHLORIDE 0.9% FLUSH
3.0000 mL | Freq: Two times a day (BID) | INTRAVENOUS | Status: DC
  Administered 2020-07-16 – 2020-07-18 (×4): 3 mL via INTRAVENOUS

## 2020-07-16 MED ORDER — METHYLPREDNISOLONE 4 MG PO TABS
12.0000 mg | ORAL_TABLET | Freq: Every day | ORAL | Status: DC
  Filled 2020-07-16: qty 3

## 2020-07-16 MED ORDER — METHYLPREDNISOLONE 4 MG PO TBPK: 4.0000 mg | ORAL_TABLET | Freq: Three times a day (TID) | ORAL | Status: DC

## 2020-07-16 NOTE — Progress Notes (Signed)
[facility] Cardiology    SUBJECTIVE: Still has some shortness of breath somewhat improved still getting diuretic therapy supplemental oxygen as necessary hopefully patient be able to be discharged home soon   Vitals:   07/16/20 0456 07/16/20 0501 07/16/20 0825 07/16/20 0857  BP:  (!) 102/56 127/75   Pulse:  80 82 80  Resp:  18 18 (!) 24  Temp:  98 F (36.7 C) 97.9 F (36.6 C)   TempSrc:      SpO2:  90% 94% 90%  Weight: (!) 189.6 kg     Height:         Intake/Output Summary (Last 24 hours) at 07/16/2020 0915 Last data filed at 07/16/2020 0500 Gross per 24 hour  Intake 720 ml  Output 4225 ml  Net -3505 ml      PHYSICAL EXAM  General: Well developed, well nourished, in no acute distress HEENT:  Normocephalic and atramatic Neck:  No JVD.  Lungs: Clear bilaterally to auscultation and percussion. Heart: HRRR . Normal S1 and S2 without gallops or murmurs.  Abdomen: Bowel sounds are positive, abdomen soft and non-tender  Msk:  Back normal, normal gait. Normal strength and tone for age. Extremities: No clubbing, cyanosis or edema.   Neuro: Alert and oriented X 3. Psych:  Good affect, responds appropriately   LABS: Basic Metabolic Panel: Recent Labs    07/14/20 0528 07/15/20 0541  NA 135 133*  K 4.4 4.4  CL 91* 89*  CO2 34* 32  GLUCOSE 158* 129*  BUN 39* 41*  CREATININE 1.40* 1.56*  CALCIUM 9.3 9.2   Liver Function Tests: No results for input(s): AST, ALT, ALKPHOS, BILITOT, PROT, ALBUMIN in the last 72 hours. No results for input(s): LIPASE, AMYLASE in the last 72 hours. CBC: Recent Labs    07/14/20 0528 07/15/20 0541  WBC 10.9* 13.0*  HGB 15.2 15.9  HCT 42.7 45.0  MCV 92.4 90.4  PLT 267 265   Cardiac Enzymes: No results for input(s): CKTOTAL, CKMB, CKMBINDEX, TROPONINI in the last 72 hours. BNP: Invalid input(s): POCBNP D-Dimer: No results for input(s): DDIMER in the last 72 hours. Hemoglobin A1C: No results for input(s): HGBA1C in the last 72  hours. Fasting Lipid Panel: No results for input(s): CHOL, HDL, LDLCALC, TRIG, CHOLHDL, LDLDIRECT in the last 72 hours. Thyroid Function Tests: No results for input(s): TSH, T4TOTAL, T3FREE, THYROIDAB in the last 72 hours.  Invalid input(s): FREET3 Anemia Panel: No results for input(s): VITAMINB12, FOLATE, FERRITIN, TIBC, IRON, RETICCTPCT in the last 72 hours.  DG Chest 1 View  Result Date: 07/15/2020 CLINICAL DATA:  69 year old male with shortness of breath.  Smoker. EXAM: CHEST  1 VIEW COMPARISON:  Portable chest 07/10/2020 and earlier. FINDINGS: Portable AP upright view at 0745 hours. Stable lung volumes and mediastinal contours. Visualized tracheal air column is within normal limits. No pneumothorax, pleural effusion or consolidation. Widespread increased pulmonary interstitium, which appears mostly chronic when compared to radiographs back to 2014. Visualized tracheal air column is within normal limits. Stable visualized osseous structures. IMPRESSION: Chronic pulmonary interstitial changes. No acute cardiopulmonary abnormality. Electronically Signed   By: Odessa Fleming M.D.   On: 07/15/2020 07:55     Echo ejection fraction of around 50-55% June 2021  TELEMETRY: Normal sinus rhythm nonspecific ST-T wave changes:  ASSESSMENT AND PLAN:  Principal Problem:   Acute respiratory failure with hypoxia (HCC) Active Problems:   CAD in native artery   Acute on chronic diastolic CHF (congestive heart failure) (HCC)   Chronic  kidney disease, stage 3a   COPD (chronic obstructive pulmonary disease) (HCC)   Pressure injury of skin   Plan Agree with continued treatment on telemetry Continue respiratory care with inhalers pulmonary follow-up Diuretic therapy for heart failure Agree with nephrology input for renal insufficiency Supplemental oxygen as necessary Hopefully discharge home soon   Alwyn Pea, MD 07/16/2020 9:15 AM

## 2020-07-17 LAB — MAGNESIUM: Magnesium: 2.4 mg/dL (ref 1.7–2.4)

## 2020-07-17 LAB — CBC
HCT: 46.2 % (ref 39.0–52.0)
Hemoglobin: 15.6 g/dL (ref 13.0–17.0)
MCH: 31.4 pg (ref 26.0–34.0)
MCHC: 33.8 g/dL (ref 30.0–36.0)
MCV: 93 fL (ref 80.0–100.0)
Platelets: 263 10*3/uL (ref 150–400)
RBC: 4.97 MIL/uL (ref 4.22–5.81)
RDW: 13.1 % (ref 11.5–15.5)
WBC: 15.5 10*3/uL — ABNORMAL HIGH (ref 4.0–10.5)
nRBC: 0 % (ref 0.0–0.2)

## 2020-07-17 LAB — BASIC METABOLIC PANEL
Anion gap: 14 (ref 5–15)
BUN: 42 mg/dL — ABNORMAL HIGH (ref 8–23)
CO2: 29 mmol/L (ref 22–32)
Calcium: 8.8 mg/dL — ABNORMAL LOW (ref 8.9–10.3)
Chloride: 89 mmol/L — ABNORMAL LOW (ref 98–111)
Creatinine, Ser: 1.59 mg/dL — ABNORMAL HIGH (ref 0.61–1.24)
GFR calc Af Amer: 51 mL/min — ABNORMAL LOW (ref >60)
GFR calc non Af Amer: 44 mL/min — ABNORMAL LOW (ref >60)
Glucose, Bld: 206 mg/dL — ABNORMAL HIGH (ref 70–99)
Potassium: 4.4 mmol/L (ref 3.5–5.1)
Sodium: 132 mmol/L — ABNORMAL LOW (ref 135–145)

## 2020-07-17 LAB — BRAIN NATRIURETIC PEPTIDE: B Natriuretic Peptide: 39.4 pg/mL (ref 0.0–100.0)

## 2020-07-17 MED ORDER — ORAL CARE MOUTH RINSE: 15.0000 mL | Freq: Two times a day (BID) | OROMUCOSAL | Status: DC

## 2020-07-17 NOTE — Progress Notes (Signed)
PROGRESS NOTE    Thomas Tanner   [id number]:[id number]  DOB: [birth year]  PCP: [facility]    DOA: 07/10/2020 LOS: 7   Brief Narrative   Eros Montour is a 69 y.o. male with medical history significant for coronary artery disease with stents, severe obesity with BMI 57, COPD, tobacco abuse, prediabetes, CKD stage 3a, and chronic diastolic CHF, presented to the ED via EMS on 07/10/2020 with progressive shortness of breath, worsening lower extremity swelling and erythema (more on right LE, wound present).  Had been placed on Keflex as outpatient and notes marked improvement.  Reports history of MI and denies any similar symptoms or chest pain.  In the ED, afebrile, mildly tachypneic, saturating in the 90s on 10 L/min supplemental oxygen on NRB mask, BP 100/69.  Labs notable for Cr 1.58 (similar to prior), mild leukocytosis, hs-troponin 173>>191, BNP 421.  EKG normal sinus with nonspecific IVCD.  Chest xray showed vascular congestion and increased interstitial markings.  Treated in the ED with aspirin and started on heparin infusion.  Admitted to hospitalist service for further evaluation and management.    Assessment & Plan   Principal Problem:   Acute respiratory failure with hypoxia (HCC) Active Problems:   CAD in native artery   Acute on chronic diastolic CHF (congestive heart failure) (HCC)   Chronic kidney disease, stage 3a   COPD (chronic obstructive pulmonary disease) (HCC)   Pressure injury of skin  Acute respiratory failure with hypoxia due to Acute on chronic diastolic CHF - presented with SOB, significant peripheral edema with serous weeping, CXR findings consistent with CHF, new supplemental O2 requirement.  Echo in June 2021 showed EF 50-55%.  Continue IV Lasix 40 mg IV bid, beta blocker, ACEI as BP tolerates.  Monitor volume status with strict I/O's and daily weights.  Low sodium diet with fluid restriction. Consider V/Q scan to evaluate for PE  if hypoxia not improving with diuresis.  Patient quite active so PE less likely.  Of note, had negative CTA chest on 05/24/20.  LE erythema improving on Keflex makes DVT less likely.  Continue lasix 40 mg iv and change to po when appropriate.  Pt still sob and iv lasix is continued.  Output of -2950.  Lasix changed to po as creatinine has gone up to 1.56.  Appreciate Cardiology management and careplan. Gross per 24 hour  Intake 720 ml  Output 4400 ml  Net -3680 ml   Pt stable creatinine has remained stable and plan is for pt eval for d/c . And home oxygen eval.   Elevated troponin / Type II NSTEMI - presented with 1 day SOB, new O2 requirement, elevated troponin 173.  Likely demand ischemia in setting of acute CHF and hypoxic respiratory failure.  No chest pain, nausea, diaphoresis or acute ischemic changes on EKG.  Treated with full dose ASA and started on heparin gtt in the ED.  Telemetry.  Continue ASA, Plavix, stain.   Cardiology consulted.  Outpatient stress test per cardiology, no inpatient workup.    COPD - not acutely exacerbated on admission, only occasional wheezing.  Acute respiratory symptoms due to CHF more than COPD.  PRN bronchodilators.   - Acute respiratory sxs primarily secondary to CHF  - Nebs as needed  currently stable.  Started on azithromycin x 5 days.    CKD Stage IIIa - Cr 1.58 on admission, stable.  Avoid nephrotoxins and hypotension.  Renally dose meds as indicated.  Monitor BMP. Creatinine has  improved to 1.37 continue to monitor.  Currently creatinine has increased to 1.56 and we will continue to monitor.   RLE cellulitis, present on admission, had been on Keflex outpatient with improvement, last day 8/3.  Not septic, tachypnea and leukocytosis more likely due to CHF.  Afebrile.  Monitor.  Tobacco abuse - reported he quit the day prior to admission.  Nicotine patch.  Counseled regarding importance .  Morbid Obesity: Body mass index is 55.15 kg/m.   Significantly   DVT prophylaxis: enoxaparin (LOVENOX) injection 40 mg Start: 07/11/20 2200   Diet:  Diet Orders (From admission, onward)    Start     Ordered   07/11/20 0903  Diet heart healthy/carb modified Room service appropriate? Yes; Fluid consistency: Thin  Diet effective now       Question Answer Comment  Diet-HS Snack? Nothing   Room service appropriate? Yes   Fluid consistency: Thin      07/11/20 0902            Code Status: Full Code    Subjective 07/17/20    Patient seen this AM.  Says he's feeling better, shortness of breath and swelling are improving.  Denies fever/chills, chest pain, n/v or other acute complaints. Pt seen today and anticipate d/c tomorrow with transition to po lasix. He is doing much better. Pt states sob has resolved. Vitals are stable and labs are stable and creatinine has improved.  07/14/20 Pt seen today he is still  C/O sob and has had good output today. Is working well with PT.  07/15/20 Pt is seen today he is  Doing better chest xray shows improvement.  Creatinine has gone up and iv lasix is changed to po. Pt denies any Complaints.  Expect d/c in am.   07/16/20 Pt is awake and coughing some and is on oxygen he takes the [place] off intermittently.  Pt's creatinine is improved and we will  Cont lasix po from today afternoon , start pot pn p o azithromycin and expect d/c in am.Change from solumedrol to medrol dose pak.   8/8 Pt is stable ,Still gets sob with min ambulation and needs to be reassessed by PT and for d/c om home oxygen.  Disposition Plan & Communication   Status is: Inpatient  Remains appropriate for inpatient status due to severity of illness requiring IV therapies and significant O2 requirement.  IV diuresing.  Dispo: The patient is from: home              Anticipated d/c is to: home              Anticipated d/c date is: 3 days              Patient currently is NOT medically stable for d/c. Family Communication: none at  bedside, will attempt to call   Consults, Procedures, Significant Events   Consultants:   Cardiology  Procedures:   None  Objective   Vitals:   07/17/20 0428 07/17/20 0836 07/17/20 1147 07/17/20 1602  BP: 102/65 (!) 105/56 (!) 109/52 (!) 98/51  Pulse: 83 81 81 78  Resp: 20 18 17 18   Temp: 98 F (36.7 C) 97.6 F (36.4 C) 98.1 F (36.7 C) 97.6 F (36.4 C)  TempSrc:  Oral Oral Oral  SpO2: 94% 96% 94% 94%  Weight:      Height:        Intake/Output Summary (Last 24 hours) at 07/17/2020 1801 Last data filed at 07/17/2020 1732 Gross  per 24 hour  Intake 240 ml  Output 2550 ml  Net -2310 ml   Filed Weights   07/13/20 0507 07/15/20 0400 07/16/20 0456  Weight: (!) 195.8 kg (!) 195.9 kg (!) 189.6 kg    Physical Exam: Blood pressure (!) 98/51, pulse 78, temperature 97.6 F (36.4 C), temperature source Oral, resp. rate 18, height 6\' 1"  (1.854 m), weight (!) 189.6 kg, SpO2 94 %.   General exam: awake, alert, no acute distress, morbidly obese Respiratory system: dimished due to body habitus but overall clear, single expiratory wheeze appreciated, no rales or rhonchi, normal respiratory effort, on 8 L/min HFNC oxygen. Cardiovascular system: normal S1/S2, RRR, bilateral LE pitting edema.   Central nervous system: A&O x3. no gross focal neurologic deficits, normal speech Extremities: moves all, BLE edema and venous stasis changes - edema is mildly improved Psychiatry: normal mood, congruent affect, judgement and insight appear normal  Labs   Data Reviewed: I have personally reviewed following labs and imaging studies  CBC: Recent Labs  Lab 07/13/20 0636 07/14/20 0528 07/15/20 0541 07/16/20 0855 07/17/20 1417  WBC 11.8* 10.9* 13.0* 13.7* 15.5*  NEUTROABS  --   --   --  10.9*  --   HGB 14.3 15.2 15.9 16.2 15.6  HCT 40.3 42.7 45.0 48.7 46.2  MCV 91.4 92.4 90.4 93.5 93.0  PLT 259 267 265 269 263   Basic Metabolic Panel: Recent Labs  Lab 07/11/20 0336 07/12/20 0524  07/13/20 0636 07/14/20 0528 07/15/20 0541 07/16/20 0855 07/17/20 1417  NA 135   < > 136 135 133* 134* 132*  K 4.8   < > 4.6 4.4 4.4 4.4 4.4  CL 99   < > 94* 91* 89* 89* 89*  CO2 26   < > 30 34* 32 31 29  GLUCOSE 102*   < > 142* 158* 129* 131* 206*  BUN 35*   < > 37* 39* 41* 42* 42*  CREATININE 1.61*   < > 1.37* 1.40* 1.56* 1.47* 1.59*  CALCIUM 8.6*   < > 9.0 9.3 9.2 9.4 8.8*  MG 2.1  --   --   --   --  2.3 2.4   < > = values in this interval not displayed.   Coagulation Profile: Recent Labs  Lab 07/10/20 2050  INR 1.2     Recent Results (from the past 240 hour(s))  SARS Coronavirus 2 by RT PCR (hospital order, performed in [facility] hospital lab) Nasopharyngeal Nasopharyngeal Swab     Status: None   Collection Time: 07/10/20  8:50 PM   Specimen: Nasopharyngeal Swab  Result Value Ref Range Status   SARS Coronavirus 2 NEGATIVE NEGATIVE Final    Comment: (NOTE) SARS-CoV-2 target nucleic acids are NOT DETECTED.  The SARS-CoV-2 RNA is generally detectable in upper and lower respiratory specimens during the acute phase of infection. The lowest concentration of SARS-CoV-2 viral copies this assay can detect is 250 copies / mL. A negative result does not preclude SARS-CoV-2 infection and should not be used as the sole basis for treatment or other patient management decisions.  A negative result may occur with improper specimen collection / handling, submission of specimen other than nasopharyngeal swab, presence of viral mutation(s) within the areas targeted by this assay, and inadequate number of viral copies (<250 copies / mL). A negative result must be combined with clinical observations, patient history, and epidemiological information.  Fact Sheet for Patients:   09/09/20  Fact Sheet for Healthcare Providers: BoilerBrush.com.cy  This test is not yet approved or  cleared by the [place] and has been  authorized for detection and/or diagnosis of SARS-CoV-2 by FDA under an Emergency Use Authorization (EUA).  This EUA will remain in effect (meaning this test can be used) for the duration of the COVID-19 declaration under Section 564(b)(1) of the Act, 21 U.S.C. section 360bbb-3(b)(1), unless the authorization is terminated or revoked sooner.  Performed at [facility], [address] [place], [place] [address]       Imaging Studies   No results found.   Medications   Scheduled Meds: . aspirin EC  81 mg Oral Daily  . azithromycin  500 mg Oral Daily  . clopidogrel  75 mg Oral Daily  . dextromethorphan-guaiFENesin  1 tablet Oral BID  . enoxaparin (LOVENOX) injection  40 mg Subcutaneous Q12H  . famotidine  20 mg Oral BID  . furosemide  40 mg Oral Daily  . ipratropium-albuterol  3 mL Nebulization TID  . mouth rinse  15 mL Mouth Rinse BID  . methylPREDNISolone  24 mg Oral Once   Followed by  . [START ON 07/18/2020] methylPREDNISolone  16 mg Oral Daily   Followed by  . [START ON 07/19/2020] methylPREDNISolone  12 mg Oral Daily   Followed by  . [START ON 07/20/2020] methylPREDNISolone  8 mg Oral Daily   Followed by  . [START ON 07/21/2020] methylPREDNISolone  4 mg Oral Daily  . metoprolol succinate  50 mg Oral Daily  . nicotine  21 mg Transdermal Daily  . rosuvastatin  20 mg Oral Daily  . sodium chloride flush  3 mL Intravenous Q12H   Continuous Infusions:None   LOS: 7 days    Gertha Calkin, MD [facility] [phone] 07/17/2020, 6:01 PM  If 7PM-7AM, please contact night-coverage.  [How to contact the [facility] Attending or Consulting provider 7A - 7P or covering provider during after hours 7P -7A, for this patient?   Check the care team in [facility] and look for a) attending/consulting [facility] provider listed and b) the [facility] team listed 1. Log into [email or web address] and use [place]'s universal password to access. If you do not have the password, please contact the hospital  operator. 2. Locate the [facility] provider you are looking for under [facility] and page to a number that you can be directly reached. 3. If you still have difficulty reaching the provider, please page the [facility] (Director on Call) for the Hospitalists listed on amion for assistance.]

## 2020-07-17 NOTE — Progress Notes (Signed)
PROGRESS NOTE    Thomas Tanner  GNF:[id number] DOB: 15-Nov-1951 DOA: 07/10/2020 PCP: [facility]   Assessment & Plan:   Principal Problem:   Acute respiratory failure with hypoxia (HCC) Active Problems:   CAD in native artery   Acute on chronic diastolic CHF (congestive heart failure) (HCC)   Chronic kidney disease, stage 3a   COPD (chronic obstructive pulmonary disease) (HCC)   Pressure injury of skin  Acute respiratory failure with hypoxia due to Acute on chronic diastolic CHF - presented with SOB, significant peripheral edema with serous weeping, CXR findings consistent with CHF, new supplemental O2 requirement.  Echo in June 2021 showed EF 50-55%. Continue IV Lasix 40 mg IV bid, beta blocker, ACEI as BP tolerates.  Monitor volume status with strict I/O's and daily weights.  Low sodium diet with fluid restriction. Consider V/Q scan to evaluate for PE if hypoxia not improving with diuresis.  Patient quite active so PE less likely.  Of note, had negative CTA chest on 05/24/20.  LE erythema improving on Keflex makes DVT less likely.  Continue lasix 40 mg iv and change to po when appropriate.  Pt still sob and iv lasix is continued.  Output of -2950.  Lasix changed to po as creatinine has gone up to 1.56.  Appreciate Cardiology management and careplan.    Gross per 24 hour  Intake 720 ml  Output 4400 ml  Net -3680 ml    Elevated troponin / Type II NSTEMI - presented with 1 day SOB, new O2 requirement, elevated troponin 173.  Likely demand ischemia in setting of acute CHF and hypoxic respiratory failure.  No chest pain, nausea, diaphoresis or acute ischemic changes on EKG.  Treated with full dose ASA and started on heparin gtt in the ED.  Telemetry.  Continue ASA, Plavix, stain.   Cardiology consulted.  Outpatient stress test per cardiology, no inpatient workup.    COPD - not acutely exacerbated on admission, only occasional wheezing.  Acute  respiratory symptoms due to CHF more than COPD.  PRN bronchodilators.   -Acute respiratory sxs primarily secondary to CHF -Nebs as neededcurrently stable.   CKD Stage IIIa- Cr 1.58 on admission, stable.  Avoid nephrotoxins and hypotension.  Renally dose meds as indicated.  Monitor BMP. Creatinine has improved to 1.37 continue to monitor.  Currently creatinine has increased to 1.56 and we will continue to monitor.   RLE cellulitis, present on admission, had been on Keflex outpatient with improvement, last day 8/3.  Not septic, tachypnea and leukocytosis more likely due to CHF.  Afebrile.  Monitor.  Tobacco abuse - reported he quit the day prior to admission.  Nicotine patch.  Counseled regarding importance .  Morbid Obesity: Body mass index is 56.98 kg/m.  Significantly   DVT prophylaxis: enoxaparin (LOVENOX) injection 40 mg Start: 07/11/20 2200    Subjective: 07/15/20 Pt is seen today he is  Doing better chest xray shows improvement.  Creatinine has gone up and iv lasix is changed to po. Pt denies any Complaints.  Expect d/c in am.  Objective: Vitals:   Filed Weights   07/13/20 0507 07/15/20 0400   Weight: (!) 195.8 kg (!) 195.9 kg     Examination:  General exam: Appears calm and comfortable  Respiratory system: Clear to auscultation. Respiratory effort normal. Cardiovascular system: S1 & S2 heard, RRR. No JVD, murmurs, rubs, gallops or clicks. No pedal edema. Gastrointestinal system: Abdomen is nondistended, soft and nontender. No organomegaly or masses felt.  Normal bowel sounds heard. Central nervous system: Alert and oriented. No focal neurological deficits. Extremities: Symmetric 5 x 5 power. Skin: No rashes, lesions or ulcers Psychiatry: Judgement and insight appear normal. Mood & affect appropriate.     Data Reviewed: I have personally reviewed following labs and imaging studies  CBC: Recent Labs  Lab 07/13/20 0636 07/14/20 0528 07/15/20 0541     WBC 11.8* 10.9* 13.0*    NEUTROABS  --   --   --     HGB 14.3 15.2 15.9    HCT 40.3 42.7 45.0    MCV 91.4 92.4 90.4    PLT 259 267 265       No results for input(s): LIPASE, AMYLASE in the last 168 hours. No results for input(s): AMMONIA in the last 168 hours. Coagulation Profile: Recent Labs  Lab 07/10/20 2050  INR 1.2    Recent Results (from the past 240 hour(s))  SARS Coronavirus 2 by RT PCR (hospital order, performed in [facility] hospital lab) Nasopharyngeal Nasopharyngeal Swab     Status: None   Collection Time: 07/10/20  8:50 PM   Specimen: Nasopharyngeal Swab  Result Value Ref Range Status   SARS Coronavirus 2 NEGATIVE NEGATIVE Final    Comment: (NOTE) SARS-CoV-2 target nucleic acids are NOT DETECTED.  The SARS-CoV-2 RNA is generally detectable in upper and lower respiratory specimens during the acute phase of infection. The lowest concentration of SARS-CoV-2 viral copies this assay can detect is 250 copies / mL. A negative result does not preclude SARS-CoV-2 infection and should not be used as the sole basis for treatment or other patient management decisions.  A negative result may occur with improper specimen collection / handling, submission of specimen other than nasopharyngeal swab, presence of viral mutation(s) within the areas targeted by this assay, and inadequate number of viral copies (<250 copies / mL). A negative result must be combined with clinical observations, patient history, and epidemiological information.  Fact Sheet for Patients:   BoilerBrush.com.cy  Fact Sheet for Healthcare Providers: https://pope.com/  This test is not yet approved or  cleared by the [place] FDA and has been authorized for detection and/or diagnosis of SARS-CoV-2 by FDA under an Emergency Use Authorization (EUA).  This EUA will remain in effect (meaning this test can be used) for the duration of the COVID-19  declaration under Section 564(b)(1) of the Act, 21 U.S.C. section 360bbb-3(b)(1), unless the authorization is terminated or revoked sooner.  Performed at [facility], [address], [place], [place] [address]      Radiology Studies: No results found.   Scheduled Meds: . aspirin EC  81 mg Oral Daily  . azithromycin  500 mg Oral Daily  . clopidogrel  75 mg Oral Daily  . dextromethorphan-guaiFENesin  1 tablet Oral BID  . enoxaparin (LOVENOX) injection  40 mg Subcutaneous Q12H  . famotidine  20 mg Oral BID  . furosemide  40 mg Oral Daily  . ipratropium-albuterol  3 mL Nebulization TID  . mouth rinse  15 mL Mouth Rinse BID  . methylPREDNISolone  24 mg Oral Once   Followed by  . [START ON 07/18/2020] methylPREDNISolone  16 mg Oral Daily   Followed by  . [START ON 07/19/2020] methylPREDNISolone  12 mg Oral Daily   Followed by  . [START ON 07/20/2020] methylPREDNISolone  8 mg Oral Daily   Followed by  . [START ON 07/21/2020] methylPREDNISolone  4 mg Oral Daily  . metoprolol succinate  50  mg Oral Daily  . nicotine  21 mg Transdermal Daily  . rosuvastatin  20 mg Oral Daily  . sodium chloride flush  3 mL Intravenous Q12H   Continuous Infusions:   LOS: 7 days   Gertha Calkin, MD. [facility] If 7PM-7AM, please contact night-coverage [email or web address] Password [email or web address] 07/17/2020, 6:05 PM

## 2020-07-18 NOTE — Progress Notes (Signed)
Pt placed on RA O2 sats 95% lying in bed. Pt did not want to walk stated he does not walk much. O2 sitting on side of bed on RA 92%.

## 2020-07-18 NOTE — Discharge Summary (Signed)
Physician Discharge Summary  Thomas Tanner ZOX:[id number] DOB: June 14, 1951 DOA: 07/10/2020  PCP: [facility]  Admit date: 07/10/2020 Discharge date: 07/18/2020  Admitted From: Home Disposition: Home  Recommendations for Outpatient Follow-up:  1. Follow up with PCP in 1-2 weeks 2. Follow-up with cardiology as a scheduled  Home Health: Not applicable Equipment/Devices: Not applicable  Discharge Condition: Stable CODE STATUS: Full code Diet recommendation: Low-salt diet  Discharge summary: Gentleman with history of COPD, chronic diastolic heart failure with known ejection fraction of 50-55% presented to the emergency room with shortness of breath, significant peripheral edema with serous weeping and chest x-ray findings consistent with CHF.  He was also requiring oxygen on arrival.  Patient was admitted to the hospital and treated with IV Lasix, continue beta-blockers ACE inhibitors.  Patient was also taking Keflex for lower extremity erythema. He was treated with IV diuresis with very good clinical improvement, negative for liter balance.  Able to wean off to room air.  Patient had minimally elevated troponin level of 173 that was thought to be demand ischemia.  Patient was also treated with the steroids and bronchodilator therapy for COPD.  With adequately stabilization, he is discharged home to resume his home diuretic regimen and heart failure regimen.  He was evaluated for need for supplemental oxygen, however he saturated 89% or above on ambulation.  No changes in medication regimen was done.  He is to follow-up with cardiology.  Discharge Diagnoses:  Principal Problem:   Acute respiratory failure with hypoxia (HCC) Active Problems:   CAD in native artery   Acute on chronic diastolic CHF (congestive heart failure) (HCC)   Chronic kidney disease, stage 3a   COPD (chronic obstructive pulmonary disease) (HCC)   Pressure injury of skin    Discharge  Instructions  Discharge Instructions    Diet - low sodium heart healthy   Complete by: As directed    Diet Carb Modified   Complete by: As directed    Discharge wound care:   Complete by: As directed    Wound care to bilateral LEs with weeping, R>L:  Cleanse legs with soap and water, rinse and pat thoroughly dry. Cover areas of weeping with single layer of xeroform gauze Hart [place] # [phone]). Top with ABD pad, secure with Kerlix roll gauze wrap applied from just below toes to just below knees.  Top Kerlix with ACE bandage applied in a similar manner.  Place feet into Prevalon boots while in bed.   Increase activity slowly   Complete by: As directed      Allergies as of 07/18/2020      Reactions   Morphine Anxiety, Other (See Comments)   Hallucinations   Morphine And Related Anxiety      Medication List    STOP taking these medications   cephALEXin 500 MG capsule Commonly known as: KEFLEX     TAKE these medications   aspirin 81 MG chewable tablet Chew 1 tablet (81 mg total) by mouth daily.   clopidogrel 75 MG tablet Commonly known as: PLAVIX Take 1 tablet (75 mg total) by mouth daily.   famotidine 20 MG tablet Commonly known as: PEPCID Take 1 tablet (20 mg total) by mouth 2 (two) times daily. What changed: when to take this   furosemide 40 MG tablet Commonly known as: Lasix Take 1 tablet (40 mg total) by mouth daily.   glucose blood test strip 1 each by Other route as needed for other. Use as instructed  lisinopril-hydrochlorothiazide 20-12.5 MG tablet Commonly known as: ZESTORETIC Take 1 tablet by mouth daily.   meloxicam 7.5 MG tablet Commonly known as: MOBIC Take 7.5 mg by mouth 2 (two) times daily.   metoprolol succinate 50 MG 24 hr tablet Commonly known as: TOPROL-XL Take 1 tablet (50 mg total) by mouth daily.   Omega-3 500 MG Caps Take 1 tab po daily   OneTouch Delica Lancets 33G Misc by Does not apply route.   potassium chloride 10 MEQ tablet Commonly  known as: KLOR-CON Take 1 tablet (10 mEq total) by mouth daily.   rosuvastatin 20 MG tablet Commonly known as: CRESTOR Take 1 tablet (20 mg total) by mouth daily.   triamcinolone cream 0.5 % Commonly known as: KENALOG Apply 1 application topically 2 (two) times daily.   Ventolin HFA 108 (90 Base) MCG/ACT inhaler Generic drug: albuterol Inhale 2 puffs into the lungs every 6 (six) hours as needed for wheezing or shortness of breath.            Discharge Care Instructions  (From admission, onward)         Start     Ordered   07/18/20 0000  Discharge wound care:       Comments: Wound care to bilateral LEs with weeping, R>L:  Cleanse legs with soap and water, rinse and pat thoroughly dry. Cover areas of weeping with single layer of xeroform gauze Hart [place] # [phone]). Top with ABD pad, secure with Kerlix roll gauze wrap applied from just below toes to just below knees.  Top Kerlix with ACE bandage applied in a similar manner.  Place feet into Prevalon boots while in bed.   07/18/20 1528          Follow-up Information    [facility] Follow up on 07/29/2020.   Specialty: Cardiology Why: at 10:00am. Enter through the [facility] entrance Contact information: [address] [place] [address] [phone]             Allergies  Allergen Reactions  . Morphine Anxiety and Other (See Comments)    Hallucinations  . Morphine And Related Anxiety    Consultations:  Cardiology   Procedures/Studies: DG Chest 1 View  Result Date: 07/15/2020 CLINICAL DATA:  69 year old male with shortness of breath.  Smoker. EXAM: CHEST  1 VIEW COMPARISON:  Portable chest 07/10/2020 and earlier. FINDINGS: Portable AP upright view at 0745 hours. Stable lung volumes and mediastinal contours. Visualized tracheal air column is within normal limits. No pneumothorax, pleural effusion or consolidation. Widespread increased pulmonary  interstitium, which appears mostly chronic when compared to radiographs back to 2014. Visualized tracheal air column is within normal limits. Stable visualized osseous structures. IMPRESSION: Chronic pulmonary interstitial changes. No acute cardiopulmonary abnormality. Electronically Signed   By: Odessa Fleming M.D.   On: 07/15/2020 07:55   DG Chest Port 1 View  Result Date: 07/10/2020 CLINICAL DATA:  Shortness of breath, CHF EXAM: PORTABLE CHEST 1 VIEW COMPARISON:  Radiograph 07/02/2020, CT 05/24/2020 FINDINGS: Mild central vascular congestion with diffuse Lea increased interstitial prominence similar to comparison studies accounting for differences in technique and inflation. No focally consolidative opacity. No pneumothorax or effusion. Cardiac silhouette is likely accentuated by portable technique. Overall, cardiomediastinal contours are similar to prior. No acute osseous or soft tissue abnormality. Telemetry leads overlie the chest. IMPRESSION: Vascular congestion with some increased interstitial markings, may reflect early interstitial edema/features of CHF. Electronically Signed   By: Samuella Cota  [facility] M.D.   On: 07/10/2020 19:18   DG Chest Port 1 View  Result Date: 07/02/2020 CLINICAL DATA:  Fever.  Right leg/foot wounds 1 week. EXAM: PORTABLE CHEST 1 VIEW COMPARISON:  05/24/2020 and 12/08/2018 FINDINGS: Lordotic technique is demonstrated. Lungs are adequately inflated with minimal chronic prominence of the perihilar markings. No focal airspace consolidation or effusion. Cardiomediastinal silhouette and remainder the exam is unchanged. IMPRESSION: No acute cardiopulmonary disease Electronically Signed   By: Elberta Fortis M.D.   On: 07/02/2020 12:15   DG Foot Complete Right  Result Date: 07/02/2020 CLINICAL DATA:  Right foot pain/ulceration with weeping wounds 1 week. EXAM: RIGHT FOOT COMPLETE - 3+ VIEW COMPARISON:  None. FINDINGS: Moderate soft tissue swelling over the plantar aspect as well as dorsal aspect  of the mid to forefoot region. No air in the soft tissues. No acute fracture or dislocation. No bone destruction to suggest osteomyelitis. Mild degenerate changes over the midfoot. Moderate size inferior calcaneal spur. IMPRESSION: Soft tissue swelling over the mid to forefoot. No air in the soft tissues. No evidence of underlying osteomyelitis. Electronically Signed   By: Elberta Fortis M.D.   On: 07/02/2020 12:17    (Echo, Carotid, EGD, Colonoscopy, ERCP)    Subjective: Patient seen and examined.  Patient was reexamined for discharge readiness in the afternoon.  He wants to go home.  He was on room air.  He thinks he did well on walking.  He thinks his legs are back to his own legs.  Eager to go home.   Discharge Exam: Vitals:   07/18/20 1346 07/18/20 1511  BP:  132/68  Pulse:  85  Resp:  18  Temp:  97.8 F (36.6 C)  SpO2: 93% 94%   Vitals:   07/18/20 0757 07/18/20 0822 07/18/20 1346 07/18/20 1511  BP: (!) 113/56   132/68  Pulse: 75   85  Resp: 18   18  Temp: 97.6 F (36.4 C)   97.8 F (36.6 C)  TempSrc:      SpO2: 94% 95% 93% 94%  Weight:      Height:        General: Pt is alert, awake, not in acute distress Morbidly obese gentleman lying in bed, not in any distress. Cardiovascular: RRR, S1/S2 +, no rubs, no gallops Respiratory: CTA bilaterally, no wheezing, no rhonchi Abdominal: Soft, NT, ND, bowel sounds + Extremities:  Both legs with chronic lymphedema, chronic skin changes and superficial wounds on dry dressing.    The results of significant diagnostics from this hospitalization (including imaging, microbiology, ancillary and laboratory) are listed below for reference.     Microbiology: Recent Results (from the past 240 hour(s))  SARS Coronavirus 2 by RT PCR (hospital order, performed in [facility] hospital lab) Nasopharyngeal Nasopharyngeal Swab     Status: None   Collection Time: 07/10/20  8:50 PM   Specimen: Nasopharyngeal Swab  Result Value Ref Range  Status   SARS Coronavirus 2 NEGATIVE NEGATIVE Final    Comment: (NOTE) SARS-CoV-2 target nucleic acids are NOT DETECTED.  The SARS-CoV-2 RNA is generally detectable in upper and lower respiratory specimens during the acute phase of infection. The lowest concentration of SARS-CoV-2 viral copies this assay can detect is 250 copies / mL. A negative result does not preclude SARS-CoV-2 infection and should not be used as the sole basis for treatment or other patient management decisions.  A negative result may occur with improper specimen collection / handling, submission of specimen other than  nasopharyngeal swab, presence of viral mutation(s) within the areas targeted by this assay, and inadequate number of viral copies (<250 copies / mL). A negative result must be combined with clinical observations, patient history, and epidemiological information.  Fact Sheet for Patients:   BoilerBrush.com.cyhttps://www.fda.gov/media/136312/download  Fact Sheet for Healthcare Providers: https://pope.com/https://www.fda.gov/media/136313/download  This test is not yet approved or  cleared by the [place] FDA and has been authorized for detection and/or diagnosis of SARS-CoV-2 by FDA under an Emergency Use Authorization (EUA).  This EUA will remain in effect (meaning this test can be used) for the duration of the COVID-19 declaration under Section 564(b)(1) of the Act, 21 U.S.C. section 360bbb-3(b)(1), unless the authorization is terminated or revoked sooner.  Performed at [facility], [address] [place], [place] [address]      Labs: BNP (last 3 results) Recent Labs    07/11/20 0906 07/15/20 0541 07/17/20 1417  BNP 247.1* 66.1 39.4   Basic Metabolic Panel: Recent Labs  Lab 07/13/20 0636 07/14/20 0528 07/15/20 0541 07/16/20 0855 07/17/20 1417  NA 136 135 133* 134* 132*  K 4.6 4.4 4.4 4.4 4.4  CL 94* 91* 89* 89* 89*  CO2 30 34* 32 31 29  GLUCOSE 142* 158* 129* 131* 206*  BUN 37* 39* 41* 42*  42*  CREATININE 1.37* 1.40* 1.56* 1.47* 1.59*  CALCIUM 9.0 9.3 9.2 9.4 8.8*  MG  --   --   --  2.3 2.4   Liver Function Tests: Recent Labs  Lab 07/16/20 0855  AST 14*  ALT 16  ALKPHOS 64  BILITOT 0.9  PROT 7.4  ALBUMIN 4.0   No results for input(s): LIPASE, AMYLASE in the last 168 hours. No results for input(s): AMMONIA in the last 168 hours. CBC: Recent Labs  Lab 07/13/20 0636 07/14/20 0528 07/15/20 0541 07/16/20 0855 07/17/20 1417  WBC 11.8* 10.9* 13.0* 13.7* 15.5*  NEUTROABS  --   --   --  10.9*  --   HGB 14.3 15.2 15.9 16.2 15.6  HCT 40.3 42.7 45.0 48.7 46.2  MCV 91.4 92.4 90.4 93.5 93.0  PLT 259 267 265 269 263   Cardiac Enzymes: No results for input(s): CKTOTAL, CKMB, CKMBINDEX, TROPONINI in the last 168 hours. BNP: Invalid input(s): POCBNP CBG: No results for input(s): GLUCAP in the last 168 hours. D-Dimer No results for input(s): DDIMER in the last 72 hours. Hgb A1c No results for input(s): HGBA1C in the last 72 hours. Lipid Profile No results for input(s): CHOL, HDL, LDLCALC, TRIG, CHOLHDL, LDLDIRECT in the last 72 hours. Thyroid function studies No results for input(s): TSH, T4TOTAL, T3FREE, THYROIDAB in the last 72 hours.  Invalid input(s): FREET3 Anemia work up No results for input(s): VITAMINB12, FOLATE, FERRITIN, TIBC, IRON, RETICCTPCT in the last 72 hours. Urinalysis    Component Value Date/Time   COLORURINE Yellow 06/10/2013 1212   APPEARANCEUR Clear 06/10/2013 1212   LABSPEC 1.019 06/10/2013 1212   PHURINE 5.0 06/10/2013 1212   GLUCOSEU Negative 06/10/2013 1212   HGBUR 1+ 06/10/2013 1212   BILIRUBINUR Negative 06/10/2013 1212   KETONESUR Negative 06/10/2013 1212   PROTEINUR Negative 06/10/2013 1212   NITRITE Negative 06/10/2013 1212   LEUKOCYTESUR Negative 06/10/2013 1212   Sepsis Labs Invalid input(s): PROCALCITONIN,  WBC,  LACTICIDVEN Microbiology Recent Results (from the past 240 hour(s))  SARS Coronavirus 2 by RT PCR (hospital  order, performed in [facility] hospital lab) Nasopharyngeal Nasopharyngeal Swab     Status: None   Collection Time: 07/10/20  8:50 PM   Specimen: Nasopharyngeal Swab  Result Value Ref Range Status   SARS Coronavirus 2 NEGATIVE NEGATIVE Final    Comment: (NOTE) SARS-CoV-2 target nucleic acids are NOT DETECTED.  The SARS-CoV-2 RNA is generally detectable in upper and lower respiratory specimens during the acute phase of infection. The lowest concentration of SARS-CoV-2 viral copies this assay can detect is 250 copies / mL. A negative result does not preclude SARS-CoV-2 infection and should not be used as the sole basis for treatment or other patient management decisions.  A negative result may occur with improper specimen collection / handling, submission of specimen other than nasopharyngeal swab, presence of viral mutation(s) within the areas targeted by this assay, and inadequate number of viral copies (<250 copies / mL). A negative result must be combined with clinical observations, patient history, and epidemiological information.  Fact Sheet for Patients:   BoilerBrush.com.cy  Fact Sheet for Healthcare Providers: https://pope.com/  This test is not yet approved or  cleared by the [place] FDA and has been authorized for detection and/or diagnosis of SARS-CoV-2 by FDA under an Emergency Use Authorization (EUA).  This EUA will remain in effect (meaning this test can be used) for the duration of the COVID-19 declaration under Section 564(b)(1) of the Act, 21 U.S.C. section 360bbb-3(b)(1), unless the authorization is terminated or revoked sooner.  Performed at [facility], [address] [place], [place] [address]      Time coordinating discharge:  35 minutes  SIGNED:   Dorcas Carrow, MD  [facility] 07/18/2020, 3:28 PM

## 2020-07-18 NOTE — Plan of Care (Signed)

## 2020-07-18 NOTE — Progress Notes (Signed)
Discharge instructions were reviewed with pt. Opportunity for questions made available. Pt verbalized understanding of d/c instructions. IV & telemetry removed from pt per protocol. Family to transport pt home.

## 2020-07-18 NOTE — Discharge Instructions (Signed)
Chronic Kidney Disease, Adult Chronic kidney disease (CKD) happens when the kidneys are damaged over a long period of time. The kidneys are two organs that help with:  Getting rid of waste and extra fluid from the blood.  Making hormones that maintain the amount of fluid in your tissues and blood vessels.  Making sure that the body has the right amount of fluids and chemicals. Most of the time, CKD does not go away, but it can usually be controlled. Steps must be taken to slow down the kidney damage or to stop it from getting worse. If this is not done, the kidneys may stop working. Follow these instructions at home: Medicines  Take over-the-counter and prescription medicines only as told by your doctor. You may need to change the amount of medicines you take.  Do not take any new medicines unless your doctor says it is okay. Many medicines can make your kidney damage worse.  Do not take any vitamin and supplements unless your doctor says it is okay. Many vitamins and supplements can make your kidney damage worse. General instructions  Follow a diet as told by your doctor. You may need to stay away from: ? Alcohol. ? Salty foods. ? Foods that are high in:  Potassium.  Calcium.  Protein.  Do not use any products that contain nicotine or tobacco, such as cigarettes and e-cigarettes. If you need help quitting, ask your doctor.  Keep track of your blood pressure at home. Tell your doctor about any changes.  If you have diabetes, keep track of your blood sugar as told by your doctor.  Try to stay at a healthy weight. If you need help, ask your doctor.  Exercise at least 30 minutes a day, 5 days a week.  Stay up-to-date with your shots (immunizations) as told by your doctor.  Keep all follow-up visits as told by your doctor. This is important. Contact a doctor if:  Your symptoms get worse.  You have new symptoms. Get help right away if:  You have symptoms of end-stage  kidney disease. These may include: ? Headaches. ? Numbness in your hands or feet. ? Easy bruising. ? Having hiccups often. ? Chest pain. ? Shortness of breath. ? Stopping of menstrual periods in women.  You have a fever.  You have very little pee (urine).  You have pain or bleeding when you pee. Summary  Chronic kidney disease (CKD) happens when the kidneys are damaged over a long period of time.  Most of the time, this condition does not go away, but it can usually be controlled. Steps must be taken to slow down the kidney damage or to stop it from getting worse.  Treatment may include a combination of medicines and lifestyle changes. This information is not intended to replace advice given to you by your health care provider. Make sure you discuss any questions you have with your health care provider. Document Revised: 11/08/2017 Document Reviewed: 12/31/2016 Elsevier Patient Education  2020 Elsevier Inc.  

## 2020-07-18 NOTE — Progress Notes (Signed)
Mobility Specialist - Progress Note   07/18/20 1232  Mobility  Activity Ambulated in room  Level of Assistance Contact guard assist, steadying assist  Assistive Device Front wheel walker  Distance Ambulated (ft) 8 ft  Mobility Response Tolerated well  Mobility performed by Mobility specialist  $Mobility charge 1 Mobility    Pre-mobility: 92 HR, 107/54 BP, 90%SpO2 Post-mobility: 82 HR, 136/74 BP, 92% SpO2   Pt was in bed watching TV upon arrival. Pt very agreeable to ambulating on room air, states "I belong to you, you're in charge". Pt mod. Independent w/ bed mobility and getting to EOB. Sitting at EOB, O2 was sitting > 90%. After pt sit-to-stand, telemetry monitor read O2 sitting at 78%. Pt did not c/o lightheadedness, no s/s of SOB. Pt sat back down in bed. After a couple of minutes, pt attempted a second time to sit-to-stand, O2 was sitting at 91%. Pt able to ambulate about 8' (to room door and back to EOB) w/ CGA'. O2 was sitting between 85-93% during ambulation. After ambulating, pt was SOB, but states "it was normal for him". Overall, pt tolerated session well. He is motivated to ambulating more on room air. Pt left in bed with call bell and phone in reach. Nurse was notified.    Grey Schlauch Mobility Specialist  07/18/20, 12:41 PM

## 2020-07-18 NOTE — Care Management Important Message (Signed)
Important Message  Patient Details  Name: Thomas Tanner MRN: [id number] Date of Birth: [birth year]   Medicare Important Message Given:  Yes     [name] 07/18/2020, 11:54 AM

## 2020-07-19 ENCOUNTER — Other Ambulatory Visit: Payer: Self-pay | Admitting: *Deleted

## 2020-07-20 ENCOUNTER — Telehealth: Payer: Self-pay

## 2020-07-20 ENCOUNTER — Other Ambulatory Visit: Payer: Self-pay

## 2020-07-20 ENCOUNTER — Encounter: Payer: Self-pay | Admitting: Emergency Medicine

## 2020-07-20 ENCOUNTER — Inpatient Hospital Stay
Admission: EM | Admit: 2020-07-20 | Discharge: 2020-07-22 | DRG: 683 | Disposition: A | Discharge status: Home Health | Payer: Medicare Other | Attending: Internal Medicine | Admitting: Internal Medicine

## 2020-07-20 ENCOUNTER — Emergency Department: Payer: Medicare Other

## 2020-07-20 DIAGNOSIS — E860 Dehydration: Secondary | ICD-10-CM | POA: Diagnosis not present

## 2020-07-20 DIAGNOSIS — Z79899 Other long term (current) drug therapy: Secondary | ICD-10-CM

## 2020-07-20 DIAGNOSIS — K219 Gastro-esophageal reflux disease without esophagitis: Secondary | ICD-10-CM | POA: Diagnosis present

## 2020-07-20 DIAGNOSIS — Z791 Long term (current) use of non-steroidal anti-inflammatories (NSAID): Secondary | ICD-10-CM

## 2020-07-20 DIAGNOSIS — F17210 Nicotine dependence, cigarettes, uncomplicated: Secondary | ICD-10-CM | POA: Diagnosis not present

## 2020-07-20 DIAGNOSIS — N179 Acute kidney failure, unspecified: Secondary | ICD-10-CM | POA: Diagnosis not present

## 2020-07-20 DIAGNOSIS — E1122 Type 2 diabetes mellitus with diabetic chronic kidney disease: Secondary | ICD-10-CM | POA: Diagnosis present

## 2020-07-20 DIAGNOSIS — I10 Essential (primary) hypertension: Secondary | ICD-10-CM | POA: Diagnosis present

## 2020-07-20 DIAGNOSIS — Z7902 Long term (current) use of antithrombotics/antiplatelets: Secondary | ICD-10-CM

## 2020-07-20 DIAGNOSIS — I5032 Chronic diastolic (congestive) heart failure: Secondary | ICD-10-CM | POA: Diagnosis present

## 2020-07-20 DIAGNOSIS — Z720 Tobacco use: Secondary | ICD-10-CM | POA: Diagnosis present

## 2020-07-20 DIAGNOSIS — Z8616 Personal history of COVID-19: Secondary | ICD-10-CM

## 2020-07-20 DIAGNOSIS — N1831 Chronic kidney disease, stage 3a: Secondary | ICD-10-CM | POA: Diagnosis not present

## 2020-07-20 DIAGNOSIS — Z743 Need for continuous supervision: Secondary | ICD-10-CM | POA: Diagnosis not present

## 2020-07-20 DIAGNOSIS — I130 Hypertensive heart and chronic kidney disease with heart failure and stage 1 through stage 4 chronic kidney disease, or unspecified chronic kidney disease: Secondary | ICD-10-CM | POA: Diagnosis not present

## 2020-07-20 DIAGNOSIS — J90 Pleural effusion, not elsewhere classified: Secondary | ICD-10-CM | POA: Diagnosis not present

## 2020-07-20 DIAGNOSIS — T501X5A Adverse effect of loop [high-ceiling] diuretics, initial encounter: Secondary | ICD-10-CM | POA: Diagnosis not present

## 2020-07-20 DIAGNOSIS — I959 Hypotension, unspecified: Secondary | ICD-10-CM | POA: Diagnosis present

## 2020-07-20 DIAGNOSIS — J449 Chronic obstructive pulmonary disease, unspecified: Secondary | ICD-10-CM | POA: Diagnosis not present

## 2020-07-20 DIAGNOSIS — I2510 Atherosclerotic heart disease of native coronary artery without angina pectoris: Secondary | ICD-10-CM | POA: Diagnosis present

## 2020-07-20 DIAGNOSIS — R6889 Other general symptoms and signs: Secondary | ICD-10-CM | POA: Diagnosis not present

## 2020-07-20 DIAGNOSIS — L03115 Cellulitis of right lower limb: Secondary | ICD-10-CM | POA: Diagnosis present

## 2020-07-20 DIAGNOSIS — T39395A Adverse effect of other nonsteroidal anti-inflammatory drugs [NSAID], initial encounter: Secondary | ICD-10-CM | POA: Diagnosis present

## 2020-07-20 DIAGNOSIS — Z8673 Personal history of transient ischemic attack (TIA), and cerebral infarction without residual deficits: Secondary | ICD-10-CM

## 2020-07-20 DIAGNOSIS — T445X5A Adverse effect of predominantly beta-adrenoreceptor agonists, initial encounter: Secondary | ICD-10-CM | POA: Diagnosis present

## 2020-07-20 DIAGNOSIS — J441 Chronic obstructive pulmonary disease with (acute) exacerbation: Secondary | ICD-10-CM | POA: Diagnosis present

## 2020-07-20 DIAGNOSIS — E785 Hyperlipidemia, unspecified: Secondary | ICD-10-CM | POA: Diagnosis present

## 2020-07-20 DIAGNOSIS — N170 Acute kidney failure with tubular necrosis: Principal | ICD-10-CM | POA: Diagnosis present

## 2020-07-20 DIAGNOSIS — J811 Chronic pulmonary edema: Secondary | ICD-10-CM | POA: Diagnosis not present

## 2020-07-20 DIAGNOSIS — Z6841 Body Mass Index (BMI) 40.0 and over, adult: Secondary | ICD-10-CM

## 2020-07-20 DIAGNOSIS — Z7982 Long term (current) use of aspirin: Secondary | ICD-10-CM

## 2020-07-20 DIAGNOSIS — I5033 Acute on chronic diastolic (congestive) heart failure: Secondary | ICD-10-CM | POA: Diagnosis present

## 2020-07-20 DIAGNOSIS — R609 Edema, unspecified: Secondary | ICD-10-CM | POA: Diagnosis not present

## 2020-07-20 DIAGNOSIS — D72829 Elevated white blood cell count, unspecified: Secondary | ICD-10-CM | POA: Insufficient documentation

## 2020-07-20 LAB — CBC
HCT: 45.1 % (ref 39.0–52.0)
Hemoglobin: 15.1 g/dL (ref 13.0–17.0)
MCH: 31.3 pg (ref 26.0–34.0)
MCHC: 33.5 g/dL (ref 30.0–36.0)
MCV: 93.6 fL (ref 80.0–100.0)
Platelets: 247 10*3/uL (ref 150–400)
RBC: 4.82 MIL/uL (ref 4.22–5.81)
RDW: 13.5 % (ref 11.5–15.5)
WBC: 15.7 10*3/uL — ABNORMAL HIGH (ref 4.0–10.5)
nRBC: 0 % (ref 0.0–0.2)

## 2020-07-20 LAB — BASIC METABOLIC PANEL
Anion gap: 14 (ref 5–15)
BUN: 60 mg/dL — ABNORMAL HIGH (ref 8–23)
CO2: 29 mmol/L (ref 22–32)
Calcium: 8.8 mg/dL — ABNORMAL LOW (ref 8.9–10.3)
Chloride: 91 mmol/L — ABNORMAL LOW (ref 98–111)
Creatinine, Ser: 3.56 mg/dL — ABNORMAL HIGH (ref 0.61–1.24)
GFR calc Af Amer: 19 mL/min — ABNORMAL LOW (ref >60)
GFR calc non Af Amer: 17 mL/min — ABNORMAL LOW (ref >60)
Glucose, Bld: 127 mg/dL — ABNORMAL HIGH (ref 70–99)
Potassium: 3.8 mmol/L (ref 3.5–5.1)
Sodium: 134 mmol/L — ABNORMAL LOW (ref 135–145)

## 2020-07-20 LAB — PROCALCITONIN: Procalcitonin: <0.1 ng/mL

## 2020-07-20 LAB — LACTIC ACID, PLASMA: Lactic Acid, Venous: 1.9 mmol/L (ref 0.5–1.9)

## 2020-07-20 LAB — URINALYSIS, COMPLETE (UACMP) WITH MICROSCOPIC
Bilirubin Urine: NEGATIVE
Glucose, UA: NEGATIVE mg/dL
Hgb urine dipstick: NEGATIVE
Ketones, ur: NEGATIVE mg/dL
Nitrite: NEGATIVE
Protein, ur: 30 mg/dL — AB
Specific Gravity, Urine: 1.019 (ref 1.005–1.030)
pH: 5 (ref 5.0–8.0)

## 2020-07-20 LAB — APTT: aPTT: 29 seconds (ref 24–36)

## 2020-07-20 LAB — BRAIN NATRIURETIC PEPTIDE: B Natriuretic Peptide: 40.6 pg/mL (ref 0.0–100.0)

## 2020-07-20 LAB — SARS CORONAVIRUS 2 BY RT PCR (HOSPITAL ORDER, PERFORMED IN ~~LOC~~ HOSPITAL LAB): SARS Coronavirus 2: NEGATIVE

## 2020-07-20 LAB — PROTIME-INR
INR: 1.1 (ref 0.8–1.2)
Prothrombin Time: 13.6 seconds (ref 11.4–15.2)

## 2020-07-20 LAB — CREATININE, URINE, RANDOM: Creatinine, Urine: 245 mg/dL

## 2020-07-20 MED ORDER — SODIUM CHLORIDE 0.9 % IV BOLUS: 500.0000 mL | Freq: Once | INTRAVENOUS | Status: DC

## 2020-07-20 MED ORDER — ONDANSETRON HCL 4 MG/2ML IJ SOLN: 4.0000 mg | Freq: Three times a day (TID) | INTRAMUSCULAR | Status: DC | PRN

## 2020-07-20 MED ORDER — NICOTINE 21 MG/24HR TD PT24
21.0000 mg | MEDICATED_PATCH | Freq: Every day | TRANSDERMAL | Status: DC
  Administered 2020-07-22: 21 mg via TRANSDERMAL
  Filled 2020-07-20 (×2): qty 1

## 2020-07-20 MED ORDER — ASPIRIN 81 MG PO CHEW
81.0000 mg | CHEWABLE_TABLET | Freq: Every day | ORAL | Status: DC
  Administered 2020-07-21 – 2020-07-22 (×2): 81 mg via ORAL
  Filled 2020-07-20 (×2): qty 1

## 2020-07-20 MED ORDER — ACETAMINOPHEN 325 MG PO TABS: 650.0000 mg | ORAL_TABLET | Freq: Four times a day (QID) | ORAL | Status: DC | PRN

## 2020-07-20 MED ORDER — ALBUTEROL SULFATE (2.5 MG/3ML) 0.083% IN NEBU: 2.5000 mg | INHALATION_SOLUTION | RESPIRATORY_TRACT | Status: DC | PRN

## 2020-07-20 MED ORDER — ROSUVASTATIN CALCIUM 10 MG PO TABS
20.0000 mg | ORAL_TABLET | Freq: Every day | ORAL | Status: DC
  Administered 2020-07-21: 20 mg via ORAL
  Filled 2020-07-20: qty 1
  Filled 2020-07-20: qty 2

## 2020-07-20 MED ORDER — SODIUM CHLORIDE 0.9 % IV BOLUS
500.0000 mL | Freq: Once | INTRAVENOUS | Status: AC
  Administered 2020-07-20: 500 mL via INTRAVENOUS

## 2020-07-20 MED ORDER — CLOPIDOGREL BISULFATE 75 MG PO TABS
75.0000 mg | ORAL_TABLET | Freq: Every day | ORAL | Status: DC
  Administered 2020-07-21 – 2020-07-22 (×2): 75 mg via ORAL
  Filled 2020-07-20 (×2): qty 1

## 2020-07-20 MED ORDER — OMEGA-3-ACID ETHYL ESTERS 1 G PO CAPS
1.0000 | ORAL_CAPSULE | Freq: Every day | ORAL | Status: DC
  Administered 2020-07-21 – 2020-07-22 (×2): 1 g via ORAL
  Filled 2020-07-20 (×2): qty 1

## 2020-07-20 MED ORDER — FAMOTIDINE 20 MG PO TABS
20.0000 mg | ORAL_TABLET | Freq: Every day | ORAL | Status: DC
  Administered 2020-07-20 – 2020-07-22 (×3): 20 mg via ORAL
  Filled 2020-07-20 (×3): qty 1

## 2020-07-20 MED ORDER — SODIUM CHLORIDE 0.9 % IV SOLN: INTRAVENOUS | Status: DC

## 2020-07-20 NOTE — Telephone Encounter (Signed)
Thomas Tanner physical therapist called informing that pt was having unusual BP, temps and low oxygen.  BP 70/41, 71/45. Temp 95.9, 02 below 90.  Pt was in hospital 07/10/20 to 07/18/20 and I spoke to Thomas Tanner and gave information, he advised pt needs to be seen at ER.  I advised to Thomas Tanner that pt needs to go back to Hospital.  dbs

## 2020-07-20 NOTE — ED Notes (Signed)
Resumed care annie rn.  Iv in place  nsr on monitor.  Pt alert.  Watching tv.

## 2020-07-20 NOTE — ED Notes (Signed)
Pt resting comfortably at this time. Respirations even and unlabored.  

## 2020-07-20 NOTE — ED Notes (Signed)
Patient denies pain and is resting comfortably.  

## 2020-07-20 NOTE — ED Provider Notes (Signed)
[facility] Emergency Department Provider Note   ____________________________________________   First MD Initiated Contact with Patient 07/20/20 1205     (approximate)  I have reviewed the triage vital signs and the nursing notes.   HISTORY  Chief Complaint Hypotension    HPI Thomas Tanner is a 69 y.o. male history of diabetes, COPD, TIA, volume overload and he self-reports COVID-19 in April  Patient been recovering since his last hospitalization, physical therapy came out to his house today to start therapy and noticed that they were not able to get a blood pressure.  Patient reports that he does have wounds on his legs that have been wrapped, but been on antibiotic for this.  No fevers no chills no chest pain.  Not short of breath more than his normal.  He has had a light headache over the last day that his largely gone away, reports was mild, all over.  Denies feeling suddenly weak or lightheaded, but does report that he has been feeling quite fatigued for the last couple of months since about the time he got Covid   Past Medical History:  Diagnosis Date  . Arthritis   . COPD (chronic obstructive pulmonary disease) (HCC)   . Diabetes (HCC)   . Hyperlipidemia   . Hypertension   . Kidney stone   . Obesity   . Tachycardia   . TIA (transient ischemic attack)     Patient Active Problem List   Diagnosis Date Noted  . Hypotension 07/20/2020  . Leukocytosis 07/20/2020  . Lower extremity cellulitis 07/20/2020  . Pressure injury of skin 07/12/2020  . Acute respiratory failure with hypoxia (HCC) 07/10/2020  . COPD (chronic obstructive pulmonary disease) (HCC)   . Elevated troponin   . Chronic diastolic CHF (congestive heart failure) (HCC) 05/24/2020  . Anasarca   . Acute renal failure superimposed on stage 3a chronic kidney disease (HCC)   . Hyperlipidemia   . Tobacco abuse   . Shortness of breath 04/28/2020  . Upper respiratory tract  infection due to COVID-19 virus 04/28/2020  . Edema 04/28/2020  . Encounter for general adult medical examination with abnormal findings 01/15/2019  . Acute non-recurrent maxillary sinusitis 01/15/2019  . Gastroesophageal reflux disease without esophagitis 01/15/2019  . CAD in native artery 12/16/2018  . Morbid obesity with BMI of 50.0-59.9, adult (HCC) 12/16/2018  . Moderate cigarette smoker 12/16/2018  . Chronic pain of right knee 08/04/2018  . Mild intermittent asthma 08/04/2018  . Impaired fasting glucose 04/03/2018  . Low back pain with right-sided sciatica 03/05/2018  . Chronic right-sided low back pain with right-sided sciatica 03/05/2018  . Pain in right hip 03/05/2018  . Hypertension 03/05/2018  . Foreign body in bladder and urethra 06/09/2013  . Kidney stone 06/09/2013  . Ureteric stone 06/09/2013  . Kidney stone 06/09/2013    Past Surgical History:  Procedure Laterality Date  . CORONARY/GRAFT ACUTE MI REVASCULARIZATION N/A 12/08/2018   Procedure: Coronary/Graft Acute MI Revascularization;  Surgeon: Alwyn Pea, MD;  Location: [facility] INVASIVE CV LAB;  Service: Cardiovascular;  Laterality: N/A;  . KIDNEY STONE SURGERY    . left arm surgery  1963  . LEFT HEART CATH AND CORONARY ANGIOGRAPHY N/A 12/08/2018   Procedure: LEFT HEART CATH AND CORONARY ANGIOGRAPHY;  Surgeon: Alwyn Pea, MD;  Location: [facility] INVASIVE CV LAB;  Service: Cardiovascular;  Laterality: N/A;    Prior to Admission medications   Medication Sig Start Date End Date Taking? Authorizing Provider  aspirin  81 MG chewable tablet Chew 1 tablet (81 mg total) by mouth daily. 06/03/19   Carlean Jews, NP  clopidogrel (PLAVIX) 75 MG tablet Take 1 tablet (75 mg total) by mouth daily. 01/08/20   Carlean Jews, NP  famotidine (PEPCID) 20 MG tablet Take 1 tablet (20 mg total) by mouth 2 (two) times daily. Patient taking differently: Take 20 mg by mouth daily.  03/09/20   Carlean Jews, NP  furosemide  (LASIX) 40 MG tablet Take 1 tablet (40 mg total) by mouth daily. 05/28/20 06/27/20  Charise Killian, MD  glucose blood test strip 1 each by Other route as needed for other. Use as instructed    [provider]  [facility] Lanius (OMEGA-3) 500 MG CAPS Take 1 tab po daily 06/04/19   Carlean Jews, NP  lisinopril-hydrochlorothiazide (ZESTORETIC) 20-12.5 MG tablet Take 1 tablet by mouth daily. 02/08/20   Carlean Jews, NP  meloxicam (MOBIC) 7.5 MG tablet Take 7.5 mg by mouth 2 (two) times daily.  06/26/20   [provider]  metoprolol succinate (TOPROL-XL) 50 MG 24 hr tablet Take 1 tablet (50 mg total) by mouth daily. 02/08/20   Carlean Jews, NP  ONETOUCH DELICA LANCETS 33G MISC by Does not apply route.    [provider]  potassium chloride (KLOR-CON) 10 MEQ tablet Take 1 tablet (10 mEq total) by mouth daily. 05/28/20 06/27/20  Charise Killian, MD  rosuvastatin (CRESTOR) 20 MG tablet Take 1 tablet (20 mg total) by mouth daily. 05/16/20   Carlean Jews, NP  triamcinolone cream (KENALOG) 0.5 % Apply 1 application topically 2 (two) times daily.    [provider]  VENTOLIN HFA 108 (90 Base) MCG/ACT inhaler Inhale 2 puffs into the lungs every 6 (six) hours as needed for wheezing or shortness of breath. 06/27/20   Carlean Jews, NP    Allergies Morphine and Morphine and related  Family History  Problem Relation Age of Onset  . Alzheimer's disease Mother   . CAD Father     Social History Social History   Tobacco Use  . Smoking status: Current Every Day Smoker    Packs/day: 0.50    Types: Cigarettes  . Smokeless tobacco: Never Used  . Tobacco comment: pt has cut back on amount and currently using the patch to assist with quiting  Substance Use Topics  . Alcohol use: No  . Drug use: No    Review of Systems Constitutional: No fever/chills but does feel fatigued Eyes: No visual changes. ENT: No sore throat. Cardiovascular: Denies chest  pain. Respiratory: Denies shortness of breath.  Has slight shortness of breath all the time. Gastrointestinal: No abdominal pain.   Genitourinary: Negative for dysuria. Musculoskeletal: Negative for back pain. Skin: Negative for rash. Neurological: Negative for areas of focal weakness or numbness.    ____________________________________________   PHYSICAL EXAM:  VITAL SIGNS: ED Triage Vitals  Enc Vitals Group     BP 07/20/20 1150 (!) 68/25     Pulse Rate 07/20/20 1150 71     Resp 07/20/20 1150 (!) 22     Temp 07/20/20 1150 97.9 F (36.6 C)     Temp Source 07/20/20 1150 Oral     SpO2 07/20/20 1150 94 %     Weight 07/20/20 1148 (!) 429 lb 10.8 oz (194.9 kg)     Height 07/20/20 1148 6\' 1"  (1.854 m)     Head Circumference --      Peak  Flow --      Pain Score 07/20/20 1148 0     Pain Loc --      Pain Edu? --      Excl. in GC? --     Constitutional: Alert and oriented.  Chronically ill-appearing, very much morbid obese.  He is very pleasant and in no acute distress. Eyes: Conjunctivae are normal. Head: Atraumatic. Nose: No congestion/rhinnorhea. Mouth/Throat: Mucous membranes are somewhat dry. Neck: No stridor.  Cardiovascular: Normal rate, regular rhythm. Grossly normal heart sounds.  Good peripheral circulation. Respiratory: Normal respiratory effort.  No retractions. Lungs CTAB. Gastrointestinal: Soft and nontender. No distention.  Very obese. Musculoskeletal: No lower extremity tenderness does have venous stasis changes but no ulceration or obvious warmth erythema or localized induration to suggest infectious or cellulitic change noted Neurologic:  Normal speech and language. No gross focal neurologic deficits are appreciated.  Skin:  Skin is warm, dry and intact. No rash noted. Psychiatric: Mood and affect are normal. Speech and behavior are normal.  ____________________________________________   LABS (all labs ordered are listed, but only abnormal results are  displayed)  Labs Reviewed  BASIC METABOLIC PANEL - Abnormal; Notable for the following components:      Result Value   Sodium 134 (*)    Chloride 91 (*)    Glucose, Bld 127 (*)    BUN 60 (*)    Creatinine, Ser 3.56 (*)    Calcium 8.8 (*)    GFR calc non Af Amer 17 (*)    GFR calc Af Amer 19 (*)    All other components within normal limits  CBC - Abnormal; Notable for the following components:   WBC 15.7 (*)    All other components within normal limits  CULTURE, BLOOD (SINGLE)  URINE CULTURE  SARS CORONAVIRUS 2 BY RT PCR (HOSPITAL ORDER, PERFORMED IN [facility])  PROCALCITONIN  LACTIC ACID, PLASMA  LACTIC ACID, PLASMA  PROTIME-INR  APTT  URINALYSIS, COMPLETE (UACMP) WITH MICROSCOPIC  BRAIN NATRIURETIC PEPTIDE  CORTISOL   ____________________________________________  EKG  Reviewed entered by me at 1150 Heart rate 79 QRS 120 QTc 460 Normal sinus rhythm, first-degree AV block.  No evidence of acute ischemia denoted ____________________________________________  RADIOLOGY  DG Chest 2 View  Result Date: 07/20/2020 CLINICAL DATA:  Hypertension EXAM: CHEST - 2 VIEW COMPARISON:  July 15, 2020 FINDINGS: The cardiomediastinal silhouette is unchanged in contour.Unchanged diffuse interstitial prominence. Mild peribronchial cuffing. No pleural effusion. No pneumothorax. No acute pleuroparenchymal abnormality. Visualized abdomen is unremarkable. Multilevel degenerative changes of the thoracic spine. IMPRESSION: No acute cardiopulmonary abnormality. Unchanged diffuse interstitial prominence and peribronchial cuffing which could reflect underlying mild pulmonary edema. Electronically Signed   By: Meda KlinefelterStephanie  Peacock MD   On: 07/20/2020 12:49    Imaging studies reviewed negative for acute.  Unchanged diffuse interstitial prominence ____________________________________________   PROCEDURES  Procedure(s) performed: None  Procedures  Critical Care performed: Yes, see  critical care note(s)  CRITICAL CARE Performed by: Sharyn CreamerMark Jamae Tison   Total critical care time: 25 minutes  Critical care time was exclusive of separately billable procedures and treating other patients.  Critical care was necessary to treat or prevent imminent or life-threatening deterioration.  Critical care was time spent personally by me on the following activities: development of treatment plan with patient and/or surrogate as well as nursing, discussions with consultants, evaluation of patient's response to treatment, examination of patient, obtaining history from patient or surrogate, ordering and performing treatments and interventions, ordering and  review of laboratory studies, ordering and review of radiographic studies, pulse oximetry and re-evaluation of patient's condition.  Patient presents with severe hypotension, requiring immediate evaluation due to severity of hypotension with systolic blood pressure less than 70.  Elevated risk for cardiovascular collapse given severity of hypotension on presentation. ____________________________________________   INITIAL IMPRESSION / ASSESSMENT AND PLAN / ED COURSE  Pertinent labs & imaging results that were available during my care of the patient were reviewed by me and considered in my medical decision making (see chart for details).   Patient presents for evaluation of low blood pressure.  Denies any acute significant associated symptoms other than feeling a slight headache this morning and he has been fatigued and slightly short of breath for couple of months since he got Covid.  He does not appear acutely ill, but is hypotensive.  Today's labs reveal significant increase in his creatinine, I suspect this is likely due to diuresis.  Denies urinary symptoms or difficulty with acute voiding.  Patient will require admission for management of acute kidney injury.  Continue to rule out infection, suspect no obvious source.   Clinical Course as of  Jul 20 1328  Wed Jul 20, 2020  1322 Blood pressure 85/52.  Receiving IV fluids at this time   [MQ]  1323 Patient hypotensive, mentating very well.  Await lactic acid.  Patient alert, understand agreeable with plan for admission to the hospital.  I suspect likely prerenal/dehydration given his recent inpatient evaluation and, dry mucous membranes, and recent diuresis.  Unwrapped his legs, no cellulitic changes noted.  No warmth.  He does have some areas that appear to be consistent with venous stasis-like changes, but I do not see evidence of obvious n nonblanching erythema   [MQ]  1327 SBP 95/71 on manual. Improved   [MQ]    Clinical Course User Index [MQ] Sharyn Creamer, MD     ____________________________________________   FINAL CLINICAL IMPRESSION(S) / ED DIAGNOSES  Final diagnoses:  AKI (acute kidney injury) (HCC)  Hypotension, unspecified hypotension type        Note:  This document was prepared using Dragon voice recognition software and may include unintentional dictation errors       Sharyn Creamer, MD 07/20/20 1330

## 2020-07-20 NOTE — ED Notes (Signed)
Report off to sarah rn  

## 2020-07-20 NOTE — H&P (Signed)
History and Physical    Thomas Tanner AUQ:[id number] DOB: April 01, 1951 DOA: 07/20/2020  Referring MD/NP/PA:   PCP: [facility]   Patient coming from:  The patient is coming from home.  At baseline, pt is independent for most of ADL.        Chief Complaint: Hypotension  HPI: Thomas Tanner is a 69 y.o. male with medical history significant of hypertension, hyperlipidemia, diet-controlled diabetes, COPD, TIA, GERD, kidney stone, CHF, CKD stage III, COVID-19 infection in April 2021, who presents with hypotension.  Patient was recently hospitalized from 8/1-8/9 due to CHF exacerbation and COPD.  Patient is doing physical therapy at home. Per report, PT was unable to obtain patient's blood pressure at today's visit.  His SBP is 80s in ED. patient has lightheadedness, denies chest pain, shortness breath, cough, fever or chills.  No nausea, vomiting, diarrhea, abdominal pain, symptoms of UTI or unilateral weakness.  Recently patient had right lower leg cellulitis and finished a course of antibiotics. Patient states he has not taken lasix since Tuesday.  ED Course: pt was found to have WBC 15.7, lactic acid 1.7, procalcitonin < 0.10, negative Covid PCR, worsening renal function, temperature normal, heart rate 75, RR 22, 19, oxygen saturation 94-96% on 3 L oxygen, chest x-ray negative for infiltration, but showed mild interstitial edema.  Patient is placed on progressive bed for observation.   Review of Systems:   General: no fevers, chills, no body weight gain, has fatigue HEENT: no blurry vision, hearing changes or sore throat Respiratory: no dyspnea, coughing, wheezing CV: no chest pain, no palpitations GI: no nausea, vomiting, abdominal pain, diarrhea, constipation GU: no dysuria, burning on urination, increased urinary frequency, hematuria  Ext: has leg edema Neuro: no unilateral weakness, numbness, or tingling, no vision change or hearing loss Skin: no  rash. Has erythema in right lower leg MSK: No muscle spasm, no deformity, no limitation of range of movement in spin Heme: No easy bruising.  Travel history: No recent long distant travel.  Allergy:  Allergies  Allergen Reactions  . Morphine Anxiety and Other (See Comments)    Hallucinations  . Morphine And Related Anxiety    Past Medical History:  Diagnosis Date  . Arthritis   . COPD (chronic obstructive pulmonary disease) (HCC)   . Diabetes (HCC)   . Hyperlipidemia   . Hypertension   . Kidney stone   . Obesity   . Tachycardia   . TIA (transient ischemic attack)     Past Surgical History:  Procedure Laterality Date  . CORONARY/GRAFT ACUTE MI REVASCULARIZATION N/A 12/08/2018   Procedure: Coronary/Graft Acute MI Revascularization;  Surgeon: Alwyn Pea, MD;  Location: [facility] INVASIVE CV LAB;  Service: Cardiovascular;  Laterality: N/A;  . KIDNEY STONE SURGERY    . left arm surgery  1963  . LEFT HEART CATH AND CORONARY ANGIOGRAPHY N/A 12/08/2018   Procedure: LEFT HEART CATH AND CORONARY ANGIOGRAPHY;  Surgeon: Alwyn Pea, MD;  Location: [facility] INVASIVE CV LAB;  Service: Cardiovascular;  Laterality: N/A;    Social History:  reports that he has been smoking cigarettes. He has been smoking about 0.50 packs per day. He has never used smokeless tobacco. He reports that he does not drink alcohol and does not use drugs.  Family History:  Family History  Problem Relation Age of Onset  . Alzheimer's disease Mother   . CAD Father      Prior to Admission medications   Medication Sig Start Date End  Date Taking? Authorizing Provider  aspirin 81 MG chewable tablet Chew 1 tablet (81 mg total) by mouth daily. 06/03/19   Carlean Jews, NP  clopidogrel (PLAVIX) 75 MG tablet Take 1 tablet (75 mg total) by mouth daily. 01/08/20   Carlean Jews, NP  famotidine (PEPCID) 20 MG tablet Take 1 tablet (20 mg total) by mouth 2 (two) times daily. Patient taking differently: Take 20  mg by mouth daily.  03/09/20   Carlean Jews, NP  furosemide (LASIX) 40 MG tablet Take 1 tablet (40 mg total) by mouth daily. 05/28/20 06/27/20  Charise Killian, MD  glucose blood test strip 1 each by Other route as needed for other. Use as instructed    [provider]  [facility] Lanius (OMEGA-3) 500 MG CAPS Take 1 tab po daily 06/04/19   Carlean Jews, NP  lisinopril-hydrochlorothiazide (ZESTORETIC) 20-12.5 MG tablet Take 1 tablet by mouth daily. 02/08/20   Carlean Jews, NP  meloxicam (MOBIC) 7.5 MG tablet Take 7.5 mg by mouth 2 (two) times daily.  06/26/20   [provider]  metoprolol succinate (TOPROL-XL) 50 MG 24 hr tablet Take 1 tablet (50 mg total) by mouth daily. 02/08/20   Carlean Jews, NP  ONETOUCH DELICA LANCETS 33G MISC by Does not apply route.    [provider]  potassium chloride (KLOR-CON) 10 MEQ tablet Take 1 tablet (10 mEq total) by mouth daily. 05/28/20 06/27/20  Charise Killian, MD  rosuvastatin (CRESTOR) 20 MG tablet Take 1 tablet (20 mg total) by mouth daily. 05/16/20   Carlean Jews, NP  triamcinolone cream (KENALOG) 0.5 % Apply 1 application topically 2 (two) times daily.    [provider]  VENTOLIN HFA 108 (90 Base) MCG/ACT inhaler Inhale 2 puffs into the lungs every 6 (six) hours as needed for wheezing or shortness of breath. 06/27/20   Carlean Jews, NP    Physical Exam: Vitals:   07/20/20 1153 07/20/20 1203 07/20/20 1328 07/20/20 1430  BP: (!) 91/59 (!) 82/72 95/71 (!) 80/61  Pulse:  75  71  Resp:  19  19  Temp:      TempSrc:      SpO2:  96%  98%  Weight:      Height:       General: Not in acute distress HEENT:       Eyes: PERRL, EOMI, no scleral icterus.       ENT: No discharge from the ears and nose, no pharynx injection, no tonsillar enlargement.        Neck: No JVD, no bruit, no mass felt. Heme: No neck lymph node enlargement. Cardiac: S1/S2, RRR, No murmurs, No gallops or rubs. Respiratory:  No rales,  wheezing, rhonchi or rubs. GI: Soft, nondistended, nontender, no rebound pain, no organomegaly, BS present. GU: No hematuria Ext:  1+DP/PT pulse bilaterally. Has 1+ leg edema bilaterally.  Has chronic venous insufficiency change in both legs.  The right lower leg has erythema and mild tenderness, but no warmth.  Musculoskeletal: No joint deformities, No joint redness or warmth, no limitation of ROM in spin. Skin: No rashes.  Neuro: Alert, oriented X3, cranial nerves II-XII grossly intact, moves all extremities normally. Psych: Patient is not psychotic, no suicidal or hemocidal ideation.  Labs on Admission: I have personally reviewed following labs and imaging studies  CBC: Recent Labs  Lab 07/14/20 0528 07/15/20 0541 07/16/20 0855 07/17/20 1417 07/20/20 1155  WBC 10.9* 13.0* 13.7* 15.5* 15.7*  NEUTROABS  --   --  10.9*  --   --   HGB 15.2 15.9 16.2 15.6 15.1  HCT 42.7 45.0 48.7 46.2 45.1  MCV 92.4 90.4 93.5 93.0 93.6  PLT 267 265 269 263 247   Basic Metabolic Panel: Recent Labs  Lab 07/14/20 0528 07/15/20 0541 07/16/20 0855 07/17/20 1417 07/20/20 1155  NA 135 133* 134* 132* 134*  K 4.4 4.4 4.4 4.4 3.8  CL 91* 89* 89* 89* 91*  CO2 34* 32 31 29 29   GLUCOSE 158* 129* 131* 206* 127*  BUN 39* 41* 42* 42* 60*  CREATININE 1.40* 1.56* 1.47* 1.59* 3.56*  CALCIUM 9.3 9.2 9.4 8.8* 8.8*  MG  --   --  2.3 2.4  --    GFR: Estimated Creatinine Clearance: 35.9 mL/min (A) (by C-G formula based on SCr of 3.56 mg/dL (H)). Liver Function Tests: Recent Labs  Lab 07/16/20 0855  AST 14*  ALT 16  ALKPHOS 64  BILITOT 0.9  PROT 7.4  ALBUMIN 4.0   No results for input(s): LIPASE, AMYLASE in the last 168 hours. No results for input(s): AMMONIA in the last 168 hours. Coagulation Profile: Recent Labs  Lab 07/20/20 1155  INR 1.1   Cardiac Enzymes: No results for input(s): CKTOTAL, CKMB, CKMBINDEX, TROPONINI in the last 168 hours. BNP (last 3 results) No results for input(s):  PROBNP in the last 8760 hours. HbA1C: No results for input(s): HGBA1C in the last 72 hours. CBG: No results for input(s): GLUCAP in the last 168 hours. Lipid Profile: No results for input(s): CHOL, HDL, LDLCALC, TRIG, CHOLHDL, LDLDIRECT in the last 72 hours. Thyroid Function Tests: No results for input(s): TSH, T4TOTAL, FREET4, T3FREE, THYROIDAB in the last 72 hours. Anemia Panel: No results for input(s): VITAMINB12, FOLATE, FERRITIN, TIBC, IRON, RETICCTPCT in the last 72 hours. Urine analysis:    Component Value Date/Time   COLORURINE Yellow 06/10/2013 1212   APPEARANCEUR Clear 06/10/2013 1212   LABSPEC 1.019 06/10/2013 1212   PHURINE 5.0 06/10/2013 1212   GLUCOSEU Negative 06/10/2013 1212   HGBUR 1+ 06/10/2013 1212   BILIRUBINUR Negative 06/10/2013 1212   KETONESUR Negative 06/10/2013 1212   PROTEINUR Negative 06/10/2013 1212   NITRITE Negative 06/10/2013 1212   LEUKOCYTESUR Negative 06/10/2013 1212   Sepsis Labs: @LABRCNTIP (procalcitonin:4,lacticidven:4) ) Recent Results (from the past 240 hour(s))  SARS Coronavirus 2 by RT PCR (hospital order, performed in [facility] hospital lab) Nasopharyngeal Nasopharyngeal Swab     Status: None   Collection Time: 07/10/20  8:50 PM   Specimen: Nasopharyngeal Swab  Result Value Ref Range Status   SARS Coronavirus 2 NEGATIVE NEGATIVE Final    Comment: (NOTE) SARS-CoV-2 target nucleic acids are NOT DETECTED.  The SARS-CoV-2 RNA is generally detectable in upper and lower respiratory specimens during the acute phase of infection. The lowest concentration of SARS-CoV-2 viral copies this assay can detect is 250 copies / mL. A negative result does not preclude SARS-CoV-2 infection and should not be used as the sole basis for treatment or other patient management decisions.  A negative result may occur with improper specimen collection / handling, submission of specimen other than nasopharyngeal swab, presence of viral mutation(s) within  the areas targeted by this assay, and inadequate number of viral copies (<250 copies / mL). A negative result must be combined with clinical observations, patient history, and epidemiological information.  Fact Sheet for Patients:   [facility]  Fact Sheet for Healthcare Providers: 09/09/20  This test is not yet  approved or  cleared by the [place] and has been authorized for detection and/or diagnosis of SARS-CoV-2 by FDA under an Emergency Use Authorization (EUA).  This EUA will remain in effect (meaning this test can be used) for the duration of the COVID-19 declaration under Section 564(b)(1) of the Act, 21 U.S.C. section 360bbb-3(b)(1), unless the authorization is terminated or revoked sooner.  Performed at [facility], [address] [place], [place] [address]   SARS Coronavirus 2 by RT PCR (hospital order, performed in [facility] hospital lab) Nasopharyngeal Nasopharyngeal Swab     Status: None   Collection Time: 07/20/20 12:59 PM   Specimen: Nasopharyngeal Swab  Result Value Ref Range Status   SARS Coronavirus 2 NEGATIVE NEGATIVE Final    Comment: (NOTE) SARS-CoV-2 target nucleic acids are NOT DETECTED.  The SARS-CoV-2 RNA is generally detectable in upper and lower respiratory specimens during the acute phase of infection. The lowest concentration of SARS-CoV-2 viral copies this assay can detect is 250 copies / mL. A negative result does not preclude SARS-CoV-2 infection and should not be used as the sole basis for treatment or other patient management decisions.  A negative result may occur with improper specimen collection / handling, submission of specimen other than nasopharyngeal swab, presence of viral mutation(s) within the areas targeted by this assay, and inadequate number of viral copies (<250 copies / mL). A negative result must be combined with clinical observations,  patient history, and epidemiological information.  Fact Sheet for Patients:   [employer]ilerBrush[employer]com.cy  Fact Sheet for Healthcare Providers: https://pope.com/  This test is not yet approved or  cleared by the [place] FDA and has been authorized for detection and/or diagnosis of SARS-CoV-2 by FDA under an Emergency Use Authorization (EUA).  This EUA will remain in effect (meaning this test can be used) for the duration of the COVID-19 declaration under Section 564(b)(1) of the Act, 21 U.S.C. section 360bbb-3(b)(1), unless the authorization is terminated or revoked sooner.  Performed at [facility], [address] [place], [place] [address]      Radiological Exams on Admission: DG Chest 2 View  Result Date: 07/20/2020 CLINICAL DATA:  Hypertension EXAM: CHEST - 2 VIEW COMPARISON:  July 15, 2020 FINDINGS: The cardiomediastinal silhouette is unchanged in contour.Unchanged diffuse interstitial prominence. Mild peribronchial cuffing. No pleural effusion. No pneumothorax. No acute pleuroparenchymal abnormality. Visualized abdomen is unremarkable. Multilevel degenerative changes of the thoracic spine. IMPRESSION: No acute cardiopulmonary abnormality. Unchanged diffuse interstitial prominence and peribronchial cuffing which could reflect underlying mild pulmonary edema. Electronically Signed   By: Meda Klinefelter MD   On: 07/20/2020 12:49     EKG: Independently reviewed.  Sinus rhythm, low voltage, QTC 468, J-point elevation in inferior leads.  Assessment/Plan Principal Problem:   Hypotension Active Problems:   Hypertension   CAD in native artery   Gastroesophageal reflux disease without esophagitis   Chronic diastolic CHF (congestive heart failure) (HCC)   Acute renal failure superimposed on stage 3a chronic kidney disease (HCC)   Tobacco abuse   COPD (chronic obstructive pulmonary disease) (HCC)   Cellulitis of right  lower extremity  Hypotension: Blood pressure 82/72, which improved to 93/42 after giving 500 cc normal saline bolus in the ED.  Etiology is not clear.  Likely due to dehydration secondary to diuretics use. Potential differential diagnosis is sepsis.  Patient had right lower leg cellulitis recently and completed a course of antibiotics.  On examination, patient has erythema in the right lower  leg, but no warmth, does not seem to have active infection. He has leukocytosis with WBC 15.7, but it seems to be a chronic issue.  Clinically patient does not seem to have sepsis.  Patient's mental status is normal.  -will place on telemetry bed for observation -IV fluid: Normal saline 500 cc x 2, then 100 cc/h -Monitor blood pressure closely -Check cortisol level -Follow-up of blood culture, urine culture, urinalysis  Hypertension -hold off all Bp meds  CAD in native artery: no CP -ASA, plavix and crestor  Chronic diastolic CHF (congestive heart failure) (HCC): 2D echo on 05/25/2020 showed EF of 50-55%.  Patient has leg edema, but no respiratory distress.  Chest x-ray showed mild interstitial edema.  No CHF exacerbation.  BNP 40.6. -Hold diuretics  Acute renal failure superimposed on stage 3a chronic kidney disease (HCC): Baseline Cre is 1.4-1.6, pt's Cre is 3.56 and BUN 60 on admission. Likely due to prerenal secondary to dehydration, and continuation of ACEI, diuretics, NSAIDs. ATN is also possible due to hypotension. - IVF as above - Follow up renal function by BMP - Avoid using renal toxic medications, hypotension and contrast dye (or carefully use) - Check FeUrea - Hold lasix, Prinzide and Mobic  Tobacco abuse -nicotine patch  COPD (chronic obstructive pulmonary disease) (HCC): Stable -As needed albuterol  Recent history of cellulitis of right lower extremity: Still has some erythema in in the right lower leg, but no warmth.  Does not seem to have active infection. -Hold off  antibiotics -Follow-up of blood culture    DVT ppx:  SQ Lovenox Code Status: Full code Family Communication: not done, no family member is at bed side.    Disposition Plan:  Anticipate discharge back to previous environment Consults called: None Admission status: Med-surg bed for obs   Status is: Observation  The patient remains OBS appropriate and will d/c before 2 midnights.  Dispo: The patient is from: Home              Anticipated d/c is to: Home              Anticipated d/c date is: 1 day              Patient currently is not medically stable to d/c.            Date of Service 07/20/2020    Lorretta HarpXilin Coreen Shippee [facility]   If 7PM-7AM, please contact night-coverage [email or web address] 07/20/2020, 2:58 PM

## 2020-07-20 NOTE — ED Triage Notes (Signed)
EMS called to residence for low blood pressure.  Per report, PT was unable to obtain patient's blood pressure at today's visit.  Just discharged from [facility] 2 days ago.  Patient states he has not taken lasix since Tuesday.  Patient states he is out of medication.  CBG:  152.  SBP:  70-80's per EMS.

## 2020-07-21 ENCOUNTER — Other Ambulatory Visit: Payer: Self-pay

## 2020-07-21 ENCOUNTER — Encounter: Payer: Self-pay | Admitting: *Deleted

## 2020-07-21 ENCOUNTER — Other Ambulatory Visit: Payer: Self-pay | Admitting: *Deleted

## 2020-07-21 DIAGNOSIS — N1831 Chronic kidney disease, stage 3a: Secondary | ICD-10-CM | POA: Diagnosis not present

## 2020-07-21 DIAGNOSIS — T39395A Adverse effect of other nonsteroidal anti-inflammatory drugs [NSAID], initial encounter: Secondary | ICD-10-CM | POA: Diagnosis present

## 2020-07-21 DIAGNOSIS — E1122 Type 2 diabetes mellitus with diabetic chronic kidney disease: Secondary | ICD-10-CM | POA: Diagnosis present

## 2020-07-21 DIAGNOSIS — Z8616 Personal history of COVID-19: Secondary | ICD-10-CM | POA: Diagnosis not present

## 2020-07-21 DIAGNOSIS — I130 Hypertensive heart and chronic kidney disease with heart failure and stage 1 through stage 4 chronic kidney disease, or unspecified chronic kidney disease: Secondary | ICD-10-CM | POA: Diagnosis present

## 2020-07-21 DIAGNOSIS — I5032 Chronic diastolic (congestive) heart failure: Secondary | ICD-10-CM | POA: Diagnosis not present

## 2020-07-21 DIAGNOSIS — F17210 Nicotine dependence, cigarettes, uncomplicated: Secondary | ICD-10-CM | POA: Diagnosis present

## 2020-07-21 DIAGNOSIS — Z7982 Long term (current) use of aspirin: Secondary | ICD-10-CM | POA: Diagnosis not present

## 2020-07-21 DIAGNOSIS — T445X5A Adverse effect of predominantly beta-adrenoreceptor agonists, initial encounter: Secondary | ICD-10-CM | POA: Diagnosis present

## 2020-07-21 DIAGNOSIS — N170 Acute kidney failure with tubular necrosis: Secondary | ICD-10-CM | POA: Diagnosis present

## 2020-07-21 DIAGNOSIS — I2510 Atherosclerotic heart disease of native coronary artery without angina pectoris: Secondary | ICD-10-CM | POA: Diagnosis not present

## 2020-07-21 DIAGNOSIS — K219 Gastro-esophageal reflux disease without esophagitis: Secondary | ICD-10-CM | POA: Diagnosis present

## 2020-07-21 DIAGNOSIS — N179 Acute kidney failure, unspecified: Secondary | ICD-10-CM | POA: Diagnosis not present

## 2020-07-21 DIAGNOSIS — Z79899 Other long term (current) drug therapy: Secondary | ICD-10-CM | POA: Diagnosis not present

## 2020-07-21 DIAGNOSIS — E860 Dehydration: Secondary | ICD-10-CM | POA: Diagnosis present

## 2020-07-21 DIAGNOSIS — I959 Hypotension, unspecified: Secondary | ICD-10-CM | POA: Diagnosis not present

## 2020-07-21 DIAGNOSIS — Z8673 Personal history of transient ischemic attack (TIA), and cerebral infarction without residual deficits: Secondary | ICD-10-CM | POA: Diagnosis not present

## 2020-07-21 DIAGNOSIS — T501X5A Adverse effect of loop [high-ceiling] diuretics, initial encounter: Secondary | ICD-10-CM | POA: Diagnosis present

## 2020-07-21 DIAGNOSIS — Z7902 Long term (current) use of antithrombotics/antiplatelets: Secondary | ICD-10-CM | POA: Diagnosis not present

## 2020-07-21 DIAGNOSIS — J449 Chronic obstructive pulmonary disease, unspecified: Secondary | ICD-10-CM | POA: Diagnosis present

## 2020-07-21 DIAGNOSIS — Z6841 Body Mass Index (BMI) 40.0 and over, adult: Secondary | ICD-10-CM | POA: Diagnosis not present

## 2020-07-21 DIAGNOSIS — Z791 Long term (current) use of non-steroidal anti-inflammatories (NSAID): Secondary | ICD-10-CM | POA: Diagnosis not present

## 2020-07-21 DIAGNOSIS — E785 Hyperlipidemia, unspecified: Secondary | ICD-10-CM | POA: Diagnosis present

## 2020-07-21 LAB — GLUCOSE, CAPILLARY
Glucose-Capillary: 141 mg/dL — ABNORMAL HIGH (ref 70–99)
Glucose-Capillary: 157 mg/dL — ABNORMAL HIGH (ref 70–99)

## 2020-07-21 LAB — BASIC METABOLIC PANEL
Anion gap: 12 (ref 5–15)
BUN: 62 mg/dL — ABNORMAL HIGH (ref 8–23)
CO2: 26 mmol/L (ref 22–32)
Calcium: 8.6 mg/dL — ABNORMAL LOW (ref 8.9–10.3)
Chloride: 95 mmol/L — ABNORMAL LOW (ref 98–111)
Creatinine, Ser: 2.28 mg/dL — ABNORMAL HIGH (ref 0.61–1.24)
GFR calc Af Amer: 33 mL/min — ABNORMAL LOW (ref >60)
GFR calc non Af Amer: 29 mL/min — ABNORMAL LOW (ref >60)
Glucose, Bld: 185 mg/dL — ABNORMAL HIGH (ref 70–99)
Potassium: 4 mmol/L (ref 3.5–5.1)
Sodium: 133 mmol/L — ABNORMAL LOW (ref 135–145)

## 2020-07-21 LAB — CORTISOL: Cortisol, Plasma: 37 ug/dL

## 2020-07-21 MED ORDER — INSULIN ASPART 100 UNIT/ML ~~LOC~~ SOLN
0.0000 [IU] | Freq: Three times a day (TID) | SUBCUTANEOUS | Status: DC
  Filled 2020-07-21 (×2): qty 1

## 2020-07-21 MED ORDER — INSULIN ASPART 100 UNIT/ML ~~LOC~~ SOLN: 0.0000 [IU] | Freq: Every day | SUBCUTANEOUS | Status: DC

## 2020-07-21 MED ORDER — NICOTINE 21 MG/24HR TD PT24
21.0000 mg | MEDICATED_PATCH | Freq: Every day | TRANSDERMAL | Status: DC
  Administered 2020-07-21: 21 mg via TRANSDERMAL

## 2020-07-21 NOTE — ED Notes (Signed)
Serenity, RN took pt to floor

## 2020-07-21 NOTE — ED Notes (Signed)
Pt was placed into a hospital bed.

## 2020-07-21 NOTE — Patient Outreach (Signed)
[facility]) Care Management [facility] CM Care Coordination outreach- documentation only  07/21/2020  Cayne Yom Eliot 1951-07-14 [id number]  [facility] CM Care Coordination documentation only re:  [name], 69 y/o male referred to [facility] CM by [facility] RN [facility] Liaison for multiple recent hospital and ED visits.  Patient was most recently hospitalized August 2-9, 2021 for acute respiratory failure with hypoxia/ CHF exacerbation with shortness of breath and significant peripheral edema.  He was discharged home to self-care with home health services in place for PT/ OT.  Patient has history including, but not limited to, dCHF; COPD; CKD-III; CAD; HTN/ HLD; DM' chronic pain; morbid obesity, pressure ulcer, and COVID-19 infection.  Upon review of EHR prior to outreaching patient, noted that patient re-presented to ED 07/20/20 for hypotension and AKI with plans to admit.  Secure communication via EMR sent to [facility] RN [facility] Liaison Rupert and Christophe Louis, notifying of patient's current hospital admission  Plan:  [facility] RN CM will follow patient's progress while hospitalized and collaborate with [facility] liaison's for discharge disposition  Caryl Pina, RN, BSN, CCRN [employer]umn[employer] [facility] Coordinator [facility] Care Management  [phone]

## 2020-07-22 DIAGNOSIS — I2510 Atherosclerotic heart disease of native coronary artery without angina pectoris: Secondary | ICD-10-CM

## 2020-07-22 LAB — BASIC METABOLIC PANEL
Anion gap: 10 (ref 5–15)
BUN: 44 mg/dL — ABNORMAL HIGH (ref 8–23)
CO2: 25 mmol/L (ref 22–32)
Calcium: 8.7 mg/dL — ABNORMAL LOW (ref 8.9–10.3)
Chloride: 100 mmol/L (ref 98–111)
Creatinine, Ser: 1.46 mg/dL — ABNORMAL HIGH (ref 0.61–1.24)
GFR calc Af Amer: 57 mL/min — ABNORMAL LOW (ref >60)
GFR calc non Af Amer: 49 mL/min — ABNORMAL LOW (ref >60)
Glucose, Bld: 149 mg/dL — ABNORMAL HIGH (ref 70–99)
Potassium: 3.7 mmol/L (ref 3.5–5.1)
Sodium: 135 mmol/L (ref 135–145)

## 2020-07-22 LAB — UREA NITROGEN, URINE: Urea Nitrogen, Ur: 343 mg/dL

## 2020-07-22 LAB — GLUCOSE, CAPILLARY: Glucose-Capillary: 140 mg/dL — ABNORMAL HIGH (ref 70–99)

## 2020-07-22 MED ORDER — FUROSEMIDE 20 MG PO TABS: 20.0000 mg | ORAL_TABLET | Freq: Every day | ORAL | 0 refills | Status: DC

## 2020-07-22 MED ORDER — METOPROLOL SUCCINATE ER 50 MG PO TB24
50.0000 mg | ORAL_TABLET | Freq: Every day | ORAL | Status: DC
  Administered 2020-07-22: 50 mg via ORAL
  Filled 2020-07-22: qty 1

## 2020-07-22 MED ORDER — FUROSEMIDE 20 MG PO TABS: 20.0000 mg | ORAL_TABLET | Freq: Every day | ORAL | Status: DC

## 2020-07-22 MED ORDER — LISINOPRIL 10 MG PO TABS: 10.0000 mg | ORAL_TABLET | Freq: Every day | ORAL | 1 refills | Status: DC

## 2020-07-22 MED ORDER — LISINOPRIL-HYDROCHLOROTHIAZIDE 20-12.5 MG PO TABS: 1.0000 | ORAL_TABLET | Freq: Every day | ORAL | Status: DC

## 2020-07-22 MED ORDER — LISINOPRIL 10 MG PO TABS
10.0000 mg | ORAL_TABLET | Freq: Every day | ORAL | Status: DC
  Administered 2020-07-22: 10 mg via ORAL
  Filled 2020-07-22: qty 1

## 2020-07-22 MED ORDER — HEPARIN SODIUM (PORCINE) 5000 UNIT/ML IJ SOLN: 5000.0000 [IU] | Freq: Three times a day (TID) | INTRAMUSCULAR | Status: DC

## 2020-07-22 NOTE — Progress Notes (Signed)
Discharge instructions explained to pt / verbalized an understanding/ iv and tele removed/ will transport off unit via wheelchair when ride arrives.  

## 2020-07-22 NOTE — Discharge Summary (Signed)
[facility] - [place] at [facility]   PATIENT NAME: Thomas Tanner    MR#:  [id number]  DATE OF BIRTH:  03/02/1951  DATE OF ADMISSION:  07/20/2020 ADMITTING PHYSICIAN: Lorretta HarpXilin Niu, MD  DATE OF DISCHARGE: 07/22/2020  PRIMARY CARE PHYSICIAN: Jenne PaneLlc, [facility]    ADMISSION DIAGNOSIS:  AKI (acute kidney injury) (HCC) [N17.9] Hypotension [I95.9] Hypotension, unspecified hypotension type [I95.9]  DISCHARGE DIAGNOSIS:  Hypotension due to AKI from dehydration and meds--improved  SECONDARY DIAGNOSIS:   Past Medical History:  Diagnosis Date  . Arthritis   . COPD (chronic obstructive pulmonary disease) (HCC)   . Diabetes (HCC)   . Hyperlipidemia   . Hypertension   . Kidney stone   . Obesity   . Tachycardia   . TIA (transient ischemic attack)     HOSPITAL COURSE:  Thomas Tanner a 69 y.o.malewith medical history significant ofhypertension, hyperlipidemia, diet-controlled diabetes, COPD, TIA, GERD, kidney stone, CHF,CKD stage III, COVID-19 infection in April 2021, who presents with hypotension. Patient was recently hospitalized from 8/1-8/9 due to CHF exacerbation and COPD. Patient is doing physical therapy at home. Per report, PT was unable to obtain patient's blood pressure at today's visit.His SBP is 80s in ED. Pt was recently started on lasix 40 mg qd after being discharged with IV lasix diuresis.   Hypotension:Blood pressure 82/72, which improved to 93/42 after giving 500 cc normal saline bolus in the ED. Etiology likely due to dehydration secondary to diuretics use with lisinopril/HCTZ and meloxicam -IV fluid: Normal saline 500 cc x 2,then 100 cc/h -Monitor blood pressure closely -baseline creat 1.4-1.6 -came in with creat 3.56--IVF--2.88--1.46 -pt feels back to baseline  Acute renal failure superimposed on stage 3a chronic kidney disease (HCC):Baseline Cre is1.4-1.6, pt's Cre is3.56 and BUN 60on admission. Likely due  to prerenal secondary to dehydration,and continuation of ACEI, diuretics, NSAIDs.ATN is also possible due to hypotension. -IVF as above - Avoid using renal toxic medications, hypotension and contrast dye (or carefully use) -now improved creat down to 1.4  Hypertension -resume lisinopril 10 mg qd, cont BB and  d/c HCTZ  CAD in native artery:no CP -ASA, plavix and crestor  Chronic diastolic CHF (congestive heart failure) (HCC):2D echo on 05/25/2020 showed EF of 50-55%. Patient has leg edema, but no respiratory distress. Chest x-ray showed mild interstitial edema. No CHF exacerbation. BNP 40.6. -resume lasix at 20 mg qd -salt restricted diet  Tobacco abuse -nicotine patch  COPD (chronic obstructive pulmonary disease) (HCC): Stable -As needed albuterol  Recent history of cellulitis of right lower extremity:Still has some erythema in in the right lower leg,butno warmth. Does not seem to have active infection. -Hold off antibiotics    DVT ppx: SQ Lovenox Code Status:Full code Family Communication: not done, no family member is at bed side.  Disposition Plan: Anticipate discharge back to previous environment Consults called:None Admission status: Med-surg bed/Inpatient   Dispo: The patient is from:Home Anticipated d/c is WU:JWJXto:Home Anticipated d/c date is: today Patient currently is medically stable to d/c.  CONSULTS OBTAINED:    DRUG ALLERGIES:   Allergies  Allergen Reactions  . Morphine Anxiety and Other (See Comments)    Hallucinations  . Morphine And Related Anxiety    DISCHARGE MEDICATIONS:   Allergies as of 07/22/2020      Reactions   Morphine Anxiety, Other (See Comments)   Hallucinations   Morphine And Related Anxiety      Medication List    STOP taking these medications   lisinopril-hydrochlorothiazide  20-12.5 MG tablet Commonly known as: ZESTORETIC   meloxicam 7.5 MG tablet Commonly  known as: MOBIC     TAKE these medications   aspirin 81 MG chewable tablet Chew 1 tablet (81 mg total) by mouth daily.   clopidogrel 75 MG tablet Commonly known as: PLAVIX Take 1 tablet (75 mg total) by mouth daily.   famotidine 20 MG tablet Commonly known as: PEPCID Take 1 tablet (20 mg total) by mouth 2 (two) times daily. What changed: when to take this   furosemide 20 MG tablet Commonly known as: LASIX Take 1 tablet (20 mg total) by mouth daily. Start taking on: July 23, 2020   lisinopril 10 MG tablet Commonly known as: ZESTRIL Take 1 tablet (10 mg total) by mouth daily.   metoprolol succinate 50 MG 24 hr tablet Commonly known as: TOPROL-XL Take 1 tablet (50 mg total) by mouth daily.   Omega-3 500 MG Caps Take 1 tab po daily   rosuvastatin 20 MG tablet Commonly known as: CRESTOR Take 1 tablet (20 mg total) by mouth daily.   Ventolin HFA 108 (90 Base) MCG/ACT inhaler Generic drug: albuterol Inhale 2 puffs into the lungs every 6 (six) hours as needed for wheezing or shortness of breath.       If you experience worsening of your admission symptoms, develop shortness of breath, life threatening emergency, suicidal or homicidal thoughts you must seek medical attention immediately by calling 911 or calling your MD immediately  if symptoms less severe.  You Must read complete instructions/literature along with all the possible adverse reactions/side effects for all the Medicines you take and that have been prescribed to you. Take any new Medicines after you have completely understood and accept all the possible adverse reactions/side effects.   Please note  You were cared for by a hospitalist during your hospital stay. If you have any questions about your discharge medications or the care you received while you were in the hospital after you are discharged, you can call the unit and asked to speak with the hospitalist on call if the hospitalist that took care of you is  not available. Once you are discharged, your primary care physician will handle any further medical issues. Please note that NO REFILLS for any discharge medications will be authorized once you are discharged, as it is imperative that you return to your primary care physician (or establish a relationship with a primary care physician if you do not have one) for your aftercare needs so that they can reassess your need for medications and monitor your lab values. Today   SUBJECTIVE   Overall well Wants to go home  VITAL SIGNS:  Blood pressure 123/67, pulse 86, temperature (!) 97.5 F (36.4 C), temperature source Oral, resp. rate 18, height 6\' 1"  (1.854 m), weight (!) 193.6 kg, SpO2 94 %.  I/O:    Intake/Output Summary (Last 24 hours) at 07/22/2020 0949 Last data filed at 07/22/2020 0700 Gross per 24 hour  Intake 3087.88 ml  Output 3550 ml  Net -462.12 ml    PHYSICAL EXAMINATION:  GENERAL:  69 y.o.-year-old patient lying in the bed with no acute distress. obese EYES: Pupils equal, round, reactive to light and accommodation. No scleral icterus.  HEENT: Head atraumatic, normocephalic. Oropharynx and nasopharynx clear.  NECK:  Supple, no jugular venous distention. No thyroid enlargement, no tenderness.  LUNGS: Normal breath sounds bilaterally, no wheezing, rales,rhonchi or crepitation. No use of accessory muscles of respiration.  CARDIOVASCULAR: S1, S2 normal.  No murmurs, rubs, or gallops.  ABDOMEN: Soft, non-tender, non-distended. Bowel sounds present. No organomegaly or mass. abd obesity EXTREMITIES: + pedal edema, no cyanosis, or clubbing. Venous stasis + NEUROLOGIC: Cranial nerves II through XII are intact. Muscle strength 5/5 in all extremities. Sensation intact. Gait not checked.  PSYCHIATRIC: The patient is alert and oriented x 3.  SKIN: No obvious rash, lesion, or ulcer.   DATA REVIEW:   CBC  Recent Labs  Lab 07/20/20 1155  WBC 15.7*  HGB 15.1  HCT 45.1  PLT 247     Chemistries  Recent Labs  Lab 07/16/20 0855 07/16/20 0855 07/17/20 1417 07/20/20 1155 07/22/20 0509  NA 134*   < > 132*   < > 135  K 4.4   < > 4.4   < > 3.7  CL 89*   < > 89*   < > 100  CO2 31   < > 29   < > 25  GLUCOSE 131*   < > 206*   < > 149*  BUN 42*   < > 42*   < > 44*  CREATININE 1.47*   < > 1.59*   < > 1.46*  CALCIUM 9.4   < > 8.8*   < > 8.7*  MG 2.3   < > 2.4  --   --   AST 14*  --   --   --   --   ALT 16  --   --   --   --   ALKPHOS 64  --   --   --   --   BILITOT 0.9  --   --   --   --    < > = values in this interval not displayed.    Microbiology Results   Recent Results (from the past 240 hour(s))  Blood culture (routine single)     Status: None (Preliminary result)   Collection Time: 07/20/20 12:59 PM   Specimen: BLOOD  Result Value Ref Range Status   Specimen Description BLOOD BLOOD LEFT HAND  Final   Special Requests   Final    BOTTLES DRAWN AEROBIC AND ANAEROBIC Blood Culture results may not be optimal due to an excessive volume of blood received in culture bottles   Culture   Final    NO GROWTH 2 DAYS Performed at [facility], [address] [place], [place] [address]    Report Status PENDING  Incomplete  SARS Coronavirus 2 by RT PCR (hospital order, performed in [facility] hospital lab) Nasopharyngeal Nasopharyngeal Swab     Status: None   Collection Time: 07/20/20 12:59 PM   Specimen: Nasopharyngeal Swab  Result Value Ref Range Status   SARS Coronavirus 2 NEGATIVE NEGATIVE Final    Comment: (NOTE) SARS-CoV-2 target nucleic acids are NOT DETECTED.  The SARS-CoV-2 RNA is generally detectable in upper and lower respiratory specimens during the acute phase of infection. The lowest concentration of SARS-CoV-2 viral copies this assay can detect is 250 copies / mL. A negative result does not preclude SARS-CoV-2 infection and should not be used as the sole basis for treatment or other patient management decisions.  A negative result  may occur with improper specimen collection / handling, submission of specimen other than nasopharyngeal swab, presence of viral mutation(s) within the areas targeted by this assay, and inadequate number of viral copies (<250 copies / mL). A negative result must be combined with clinical observations, patient history, and epidemiological information.  Fact  Sheet for Patients:   BoilerBrush.com.cy  Fact Sheet for Healthcare Providers: https://pope.com/  This test is not yet approved or  cleared by the [place] FDA and has been authorized for detection and/or diagnosis of SARS-CoV-2 by FDA under an Emergency Use Authorization (EUA).  This EUA will remain in effect (meaning this test can be used) for the duration of the COVID-19 declaration under Section 564(b)(1) of the Act, 21 U.S.C. section 360bbb-3(b)(1), unless the authorization is terminated or revoked sooner.  Performed at [facility], [address] [place] [place], [place] [address]     RADIOLOGY:  DG Chest 2 View  Result Date: 07/20/2020 CLINICAL DATA:  Hypertension EXAM: CHEST - 2 VIEW COMPARISON:  July 15, 2020 FINDINGS: The cardiomediastinal silhouette is unchanged in contour.Unchanged diffuse interstitial prominence. Mild peribronchial cuffing. No pleural effusion. No pneumothorax. No acute pleuroparenchymal abnormality. Visualized abdomen is unremarkable. Multilevel degenerative changes of the thoracic spine. IMPRESSION: No acute cardiopulmonary abnormality. Unchanged diffuse interstitial prominence and peribronchial cuffing which could reflect underlying mild pulmonary edema. Electronically Signed   By: Meda Klinefelter MD   On: 07/20/2020 12:49     CODE STATUS:  Code Status History    Date Active Date Inactive Code Status Order ID Comments User Context   07/10/2020 2110 07/18/2020 2152 Full Code [id number]  Briscoe Deutscher, MD ED   05/24/2020 1725 05/28/2020 1922  Full Code [id number]  Alford Highland, MD ED   12/08/2018 0634 12/10/2018 1657 Full Code [id number]  Arnaldo Natal, MD Inpatient   12/08/[id number] 12/08/2018 0634 Full Code [id number]  Alwyn Pea, MD Inpatient   Advance Care Planning Activity    Questions for Most Recent Historical Code Status (Order [id number])        TOTAL TIME TAKING CARE OF THIS PATIENT: *35* minutes.    Enedina Finner M.D  [facility]    CC: Primary care physician; Arsenio Loader, [facility]

## 2020-07-22 NOTE — TOC Transition Note (Signed)
Transition of Care [facility]) - CM/SW Discharge Note   Patient Details  Name: Thomas Tanner MRN: [id number] Date of Birth: [birth year]  Transition of Care [facility]) CM/SW Contact:  Shawn Route, RN Phone Number: 07/22/2020, 9:41 AM   Clinical Narrative:    Notified by [facility] patient was active with PT prior to admission.  Sent Dr Allena Katz a request for a resumption order at discharge.    Final next level of care: Home w Home Health Services Barriers to Discharge: No Barriers Identified   Patient Goals and CMS Choice        Discharge Placement                       Discharge Plan and Services                          HH Arranged: PT HH Agency: [facility] Date HH Agency Contacted: 07/22/20 Time HH Agency Contacted: [phone] Representative spoke with at [facility] Agency: Barbara Cower  Social Determinants of Health (SDOH) Interventions     Readmission Risk Interventions No flowsheet data found.

## 2020-07-22 NOTE — Progress Notes (Addendum)
Wondering when he can go home.[facility]  - [place] at [facility]   PATIENT NAME: Thomas Tanner    MR#:  [id number]  DATE OF BIRTH:  05-07-1951  SUBJECTIVE:  came in with weakness and lightheadedness. Was found to be hypotensive. Feels better after IV fluids.  REVIEW OF SYSTEMS:   Review of Systems  Constitutional: Negative for chills, fever and weight loss.  HENT: Negative for ear discharge, ear pain and nosebleeds.   Eyes: Negative for blurred vision, pain and discharge.  Respiratory: Negative for sputum production, shortness of breath, wheezing and stridor.   Cardiovascular: Positive for leg swelling. Negative for chest pain, palpitations, orthopnea and PND.  Gastrointestinal: Negative for abdominal pain, diarrhea, nausea and vomiting.  Genitourinary: Negative for frequency and urgency.  Musculoskeletal: Negative for back pain and joint pain.  Neurological: Positive for weakness. Negative for sensory change, speech change and focal weakness.  Psychiatric/Behavioral: Negative for depression and hallucinations. The patient is not nervous/anxious.    Tolerating Diet:yes Tolerating PT:   DRUG ALLERGIES:   Allergies  Allergen Reactions  . Morphine Anxiety and Other (See Comments)    Hallucinations  . Morphine And Related Anxiety    VITALS:  Blood pressure 123/67, pulse 86, temperature (!) 97.5 F (36.4 C), temperature source Oral, resp. rate 18, height 6\' 1"  (1.854 m), weight (!) 193.6 kg, SpO2 94 %.  PHYSICAL EXAMINATION:   Physical Exam  GENERAL:  69 y.o.-year-old patient lying in the bed with no acute distress. Obese EYES: Pupils equal, round, reactive to light and accommodation. No scleral icterus.   HEENT: Head atraumatic, normocephalic. Oropharynx and nasopharynx clear.  NECK:  Supple, no jugular venous distention. No thyroid enlargement, no tenderness.  LUNGS: Normal breath sounds bilaterally, no wheezing, rales, rhonchi. No use of accessory  muscles of respiration.  CARDIOVASCULAR: S1, S2 normal. No murmurs, rubs, or gallops.  ABDOMEN: Soft, nontender, nondistended. Bowel sounds present. No organomegaly or mass.  EXTREMITIES: No cyanosis, clubbing  Chronic + edema b/l.    NEUROLOGIC: Cranial nerves II through XII are intact. No focal Motor or sensory deficits b/l.   PSYCHIATRIC:  patient is alert and oriented x 3.  SKIN: No obvious rash, lesion, or ulcer.   LABORATORY PANEL:  CBC Recent Labs  Lab 07/20/20 1155  WBC 15.7*  HGB 15.1  HCT 45.1  PLT 247    Chemistries  Recent Labs  Lab 07/16/20 0855 07/16/20 0855 07/17/20 1417 07/20/20 1155 07/22/20 0509  NA 134*   < > 132*   < > 135  K 4.4   < > 4.4   < > 3.7  CL 89*   < > 89*   < > 100  CO2 31   < > 29   < > 25  GLUCOSE 131*   < > 206*   < > 149*  BUN 42*   < > 42*   < > 44*  CREATININE 1.47*   < > 1.59*   < > 1.46*  CALCIUM 9.4   < > 8.8*   < > 8.7*  MG 2.3   < > 2.4  --   --   AST 14*  --   --   --   --   ALT 16  --   --   --   --   ALKPHOS 64  --   --   --   --   BILITOT 0.9  --   --   --   --    < > =  values in this interval not displayed.   Cardiac Enzymes No results for input(s): TROPONINI in the last 168 hours. RADIOLOGY:  DG Chest 2 View  Result Date: 07/20/2020 CLINICAL DATA:  Hypertension EXAM: CHEST - 2 VIEW COMPARISON:  July 15, 2020 FINDINGS: The cardiomediastinal silhouette is unchanged in contour.Unchanged diffuse interstitial prominence. Mild peribronchial cuffing. No pleural effusion. No pneumothorax. No acute pleuroparenchymal abnormality. Visualized abdomen is unremarkable. Multilevel degenerative changes of the thoracic spine. IMPRESSION: No acute cardiopulmonary abnormality. Unchanged diffuse interstitial prominence and peribronchial cuffing which could reflect underlying mild pulmonary edema. Electronically Signed   By: Meda Klinefelter MD   On: 07/20/2020 12:49   ASSESSMENT AND PLAN:   Thomas Tanner is a 69 y.o. male  with medical history significant of hypertension, hyperlipidemia, diet-controlled diabetes, COPD, TIA, GERD, kidney stone, CHF, CKD stage III, COVID-19 infection in April 2021, who presents with hypotension. Patient was recently hospitalized from 8/1-8/9 due to CHF exacerbation and COPD.  Patient is doing physical therapy at home. Per report, PT was unable to obtain patient's blood pressure at today's visit.  His SBP is 80s in ED. Pt was recently started on lasix 40 mg qd after being discharged with IV lasix diuresis.   Hypotension: Blood pressure 82/72, which improved to 93/42 after giving 500 cc normal saline bolus in the ED.  Etiology likely due to dehydration secondary to diuretics use with lisinopril/HCTZ and meloxicam -IV fluid: Normal saline 500 cc x 2, then 100 cc/h -Monitor blood pressure closely -baseline creat 1.4-1.6 -came in with creat 3.56--IVF--2.88 -pt feels better  Acute renal failure superimposed on stage 3a chronic kidney disease (HCC): Baseline Cre is 1.4-1.6, pt's Cre is 3.56 and BUN 60 on admission. Likely due to prerenal secondary to dehydration, and continuation of ACEI, diuretics, NSAIDs. ATN is also possible due to hypotension. - IVF as above - Avoid using renal toxic medications, hypotension and contrast dye (or carefully use) -trending down  Hypertension -hold BP meds  CAD in native artery: no CP -ASA, plavix and crestor  Chronic diastolic CHF (congestive heart failure) (HCC): 2D echo on 05/25/2020 showed EF of 50-55%.  Patient has leg edema, but no respiratory distress.  Chest x-ray showed mild interstitial edema.  No CHF exacerbation.  BNP 40.6. -salt restricted diet  Tobacco abuse -nicotine patch  COPD (chronic obstructive pulmonary disease) (HCC): Stable -As needed albuterol  Recent history of cellulitis of right lower extremity: Still has some erythema in in the right lower leg, but no warmth.  Does not seem to have active infection. -Hold off  antibiotics    DVT ppx:  SQ Lovenox Code Status: Full code Family Communication: not done, no family member is at bed side.    Disposition Plan:  Anticipate discharge back to previous environment Consults called: None Admission status: Med-surg bed/Inpatient   Dispo: The patient is from: Home  Anticipated d/c is to: Home  Anticipated d/c date is: today  Patient currently is medically not stable to d/c.Needs 1 day of IVF  TOTAL TIME TAKING CARE OF THIS PATIENT: *25* minutes.  >50% time spent on counselling and coordination of care  Note: This dictation was prepared with Dragon dictation along with smaller phrase technology. Any transcriptional errors that result from this process are unintentional.  Enedina Finner M.D    [facility]   CC: Primary care physician; [facility] AssociatesPatient ID: [facility]anner, male   DOB: December 10, 1951, 69 y.o.   MRN: [id number]

## 2020-07-23 DIAGNOSIS — J9601 Acute respiratory failure with hypoxia: Secondary | ICD-10-CM | POA: Diagnosis not present

## 2020-07-23 DIAGNOSIS — J449 Chronic obstructive pulmonary disease, unspecified: Secondary | ICD-10-CM | POA: Diagnosis not present

## 2020-07-23 DIAGNOSIS — N179 Acute kidney failure, unspecified: Secondary | ICD-10-CM | POA: Diagnosis not present

## 2020-07-23 DIAGNOSIS — I130 Hypertensive heart and chronic kidney disease with heart failure and stage 1 through stage 4 chronic kidney disease, or unspecified chronic kidney disease: Secondary | ICD-10-CM | POA: Diagnosis not present

## 2020-07-23 DIAGNOSIS — I5033 Acute on chronic diastolic (congestive) heart failure: Secondary | ICD-10-CM | POA: Diagnosis not present

## 2020-07-23 DIAGNOSIS — I2510 Atherosclerotic heart disease of native coronary artery without angina pectoris: Secondary | ICD-10-CM | POA: Diagnosis not present

## 2020-07-23 DIAGNOSIS — E1151 Type 2 diabetes mellitus with diabetic peripheral angiopathy without gangrene: Secondary | ICD-10-CM | POA: Diagnosis not present

## 2020-07-23 DIAGNOSIS — I890 Lymphedema, not elsewhere classified: Secondary | ICD-10-CM | POA: Diagnosis not present

## 2020-07-23 DIAGNOSIS — E1122 Type 2 diabetes mellitus with diabetic chronic kidney disease: Secondary | ICD-10-CM | POA: Diagnosis not present

## 2020-07-23 DIAGNOSIS — I959 Hypotension, unspecified: Secondary | ICD-10-CM | POA: Diagnosis not present

## 2020-07-23 DIAGNOSIS — N1831 Chronic kidney disease, stage 3a: Secondary | ICD-10-CM | POA: Diagnosis not present

## 2020-07-23 DIAGNOSIS — I872 Venous insufficiency (chronic) (peripheral): Secondary | ICD-10-CM | POA: Diagnosis not present

## 2020-07-23 DIAGNOSIS — L03115 Cellulitis of right lower limb: Secondary | ICD-10-CM | POA: Diagnosis not present

## 2020-07-23 LAB — URINE CULTURE: Culture: 60000 — AB

## 2020-07-25 ENCOUNTER — Other Ambulatory Visit: Payer: Self-pay | Admitting: *Deleted

## 2020-07-25 ENCOUNTER — Encounter: Payer: Self-pay | Admitting: *Deleted

## 2020-07-25 ENCOUNTER — Telehealth: Payer: Self-pay

## 2020-07-25 LAB — CULTURE, BLOOD (SINGLE): Culture: NO GROWTH

## 2020-07-25 NOTE — Telephone Encounter (Signed)
Gave verbal order for PT for 2 times a week for 4 weeks to [facility].  dbs

## 2020-07-25 NOTE — Patient Outreach (Signed)
[facility] Customer service manager [facility]) Care Management [facility] CM Telephone Outreach,  PCP office completes Transition of Care follow up post-hospital discharge Post- (most recent) hospital discharge day # 3 Unsuccessful initial outreach attempt- new referral  07/25/2020  Mukesh Kornegay [birth year] [id number]  Unsuccessful initial outreach attempt to  [name], 69 y/o male referred to [facility] CM by [facility] RN [facility] Liaison for multiple recent hospital and ED visits.  Patient was recently hospitalized August 2-9, 2021 for acute respiratory failure with hypoxia/ CHF exacerbation with shortness of breath and significant peripheral edema.  He was discharged home to self-care with home health services in place for PT/ OT.  Patient has history including, but not limited to, dCHF; COPD; CKD-III; CAD; HTN/ HLD; DM' chronic pain; morbid obesity, pressure ulcer, and COVID-19 infection.  Patient re-presented to ED 07/20/20 for hypotension and AKI and was re-admitted to hospital August 11-13, 2021 for hypotension and AKI; he was again discharged home to self-care with established home health services in place.  HIPAA compliant voice mail message left for patient, requesting return call back.  Plan:  Will place [facility] CM unsuccessful patient outreach letter in mail requesting call back in writing  Will re-attempt [facility] CM telephone outreach within 4 business days if I do not hear back from patient first.  Caryl Pina, RN, BSN, CCRN Alumnus [facility] Coordinator [facility] Care Management  [phone]

## 2020-07-27 ENCOUNTER — Telehealth: Payer: Self-pay

## 2020-07-27 ENCOUNTER — Ambulatory Visit: Payer: Medicare Other | Admitting: Internal Medicine

## 2020-07-27 DIAGNOSIS — L03115 Cellulitis of right lower limb: Secondary | ICD-10-CM | POA: Diagnosis not present

## 2020-07-27 DIAGNOSIS — N179 Acute kidney failure, unspecified: Secondary | ICD-10-CM | POA: Diagnosis not present

## 2020-07-27 DIAGNOSIS — I2510 Atherosclerotic heart disease of native coronary artery without angina pectoris: Secondary | ICD-10-CM | POA: Diagnosis not present

## 2020-07-27 DIAGNOSIS — I130 Hypertensive heart and chronic kidney disease with heart failure and stage 1 through stage 4 chronic kidney disease, or unspecified chronic kidney disease: Secondary | ICD-10-CM | POA: Diagnosis not present

## 2020-07-27 DIAGNOSIS — I959 Hypotension, unspecified: Secondary | ICD-10-CM | POA: Diagnosis not present

## 2020-07-27 DIAGNOSIS — N1831 Chronic kidney disease, stage 3a: Secondary | ICD-10-CM | POA: Diagnosis not present

## 2020-07-27 DIAGNOSIS — I872 Venous insufficiency (chronic) (peripheral): Secondary | ICD-10-CM | POA: Diagnosis not present

## 2020-07-27 DIAGNOSIS — E1151 Type 2 diabetes mellitus with diabetic peripheral angiopathy without gangrene: Secondary | ICD-10-CM | POA: Diagnosis not present

## 2020-07-27 DIAGNOSIS — J9601 Acute respiratory failure with hypoxia: Secondary | ICD-10-CM | POA: Diagnosis not present

## 2020-07-27 DIAGNOSIS — I890 Lymphedema, not elsewhere classified: Secondary | ICD-10-CM | POA: Diagnosis not present

## 2020-07-27 DIAGNOSIS — I5033 Acute on chronic diastolic (congestive) heart failure: Secondary | ICD-10-CM | POA: Diagnosis not present

## 2020-07-27 DIAGNOSIS — E1122 Type 2 diabetes mellitus with diabetic chronic kidney disease: Secondary | ICD-10-CM | POA: Diagnosis not present

## 2020-07-27 DIAGNOSIS — J449 Chronic obstructive pulmonary disease, unspecified: Secondary | ICD-10-CM | POA: Diagnosis not present

## 2020-07-27 NOTE — Telephone Encounter (Signed)
Gave verbal order to Loura Halt  for PT for wound care due to pt not being to go to outpatient [phone])

## 2020-07-28 ENCOUNTER — Encounter: Payer: Self-pay | Admitting: *Deleted

## 2020-07-28 ENCOUNTER — Other Ambulatory Visit: Payer: Self-pay | Admitting: *Deleted

## 2020-07-28 ENCOUNTER — Telehealth: Payer: Self-pay

## 2020-07-28 NOTE — Telephone Encounter (Signed)
Lmom to confirm and screen for 08-01-20 ov. 

## 2020-07-28 NOTE — Patient Outreach (Signed)
[facility] Customer service manager [facility]) Care Management [facility] CM Telephone Outreach, new referral PCP office completes Transition of Care follow up post-hospital discharge Post hospital discharge day # 6 Unsuccessful (consecutive) outreach attempt # 2- new patient  07/28/2020  [name] 01-23-1951 [id number]  Unsuccessful (consecutive) second outreach attempt to  [name], 69 y/o male referred to [facility] CM by [facility] RN [facility] Liaison 07/19/20 for multiple recent hospital and ED visits.  Patient was recently hospitalized August 2-9, 2021 for acute respiratory failure with hypoxia/ CHF exacerbation with shortness of breath and significant peripheral edema. He was discharged home to self-care with home health services in place for PT/ OT. Patient has history including, but not limited to, dCHF; COPD; CKD-III; CAD; HTN/ HLD; DM' chronic pain; morbid obesity, pressure ulcer, and COVID-19 infection.  Patient re-presented to ED 07/20/20 for hypotension and AKI and was re-admitted to hospital August 11-13, 2021 for hypotension and AKI; he was again discharged home to self-care with established home health services in place.  HIPAA compliant voice mail message left for patient, requesting return call back.  Plan:  Verified [facility] CM unsuccessful patient outreach letter placed in mail requesting call back in writing on July 25, 2020  Will re-attempt [facility] CM telephone outreach within 4 business days if I do not hear back from patient first  Caryl Pina, RN, BSN, [employer] Coordinator [facility] Care Management  [phone]

## 2020-07-28 NOTE — Progress Notes (Deleted)
   Patient ID: Thomas Tanner, male    DOB: July 20, 1951, 69 y.o.   MRN: [id number]  HPI  Mr Durr is a 69 y/o male with a history of  Echo report from 05/25/20 reviewed and showed an EF of 50-55% along with mild LVH.   Catheterization done 12/08/18 and showed:  Mid RCA lesion is 99% stenosed.  Mid Cx lesion is 50% stenosed.  Mid LAD lesion is 90% stenosed.  Post intervention, there is a 0% residual stenosis.  A stent was successfully placed.  Post intervention, there is a 0% residual stenosis.  A stent was successfully placed.  Successful DES stent to mid RCA as well as mid LAD both with DES  Admitted 07/20/20 due to hypotension and acute renal failure. Given IVF. HCTZ stopped. Discharged after 2 days. Admitted 07/10/20 due to acute on chronic HF with weeping of legs. Cardiology and wound consults obtained. Initially given IV lasix with transition to oral diuretics. On antibiotics for leg erythema. Initially placed on oxygen but then weaned to room air. Elevated troponin thought to be due to demand ischemia. Discharged after 8 days.   He presents today for his initial visit with a chief complaint of  Review of Systems    Physical Exam    Assessment & Plan:  1: Chronic heart failure with preserved ejection fraction with structural changes (LVH)- - NYHA class - saw cardiology (Fath) during 07/10/20 admission - BNP 07/20/20 was 40.6  2: HTN- - BP - saw PCP Hermenia Fiscal) 04/28/20 - BMP 07/22/20 reviewed and showed sodium 135, potassium 3.7, creatinine 1.46 and GFR 49  3: COPD- - saw pulmonology Juanda Bond) 06/06/20

## 2020-07-29 ENCOUNTER — Ambulatory Visit: Payer: Medicare Other | Admitting: Family

## 2020-07-29 DIAGNOSIS — I959 Hypotension, unspecified: Secondary | ICD-10-CM | POA: Diagnosis not present

## 2020-07-29 DIAGNOSIS — I5033 Acute on chronic diastolic (congestive) heart failure: Secondary | ICD-10-CM | POA: Diagnosis not present

## 2020-07-29 DIAGNOSIS — J9601 Acute respiratory failure with hypoxia: Secondary | ICD-10-CM | POA: Diagnosis not present

## 2020-07-29 DIAGNOSIS — I890 Lymphedema, not elsewhere classified: Secondary | ICD-10-CM | POA: Diagnosis not present

## 2020-07-29 DIAGNOSIS — E1151 Type 2 diabetes mellitus with diabetic peripheral angiopathy without gangrene: Secondary | ICD-10-CM | POA: Diagnosis not present

## 2020-07-29 DIAGNOSIS — E1122 Type 2 diabetes mellitus with diabetic chronic kidney disease: Secondary | ICD-10-CM | POA: Diagnosis not present

## 2020-07-29 DIAGNOSIS — N1831 Chronic kidney disease, stage 3a: Secondary | ICD-10-CM | POA: Diagnosis not present

## 2020-07-29 DIAGNOSIS — I130 Hypertensive heart and chronic kidney disease with heart failure and stage 1 through stage 4 chronic kidney disease, or unspecified chronic kidney disease: Secondary | ICD-10-CM | POA: Diagnosis not present

## 2020-07-29 DIAGNOSIS — I872 Venous insufficiency (chronic) (peripheral): Secondary | ICD-10-CM | POA: Diagnosis not present

## 2020-07-29 DIAGNOSIS — L03115 Cellulitis of right lower limb: Secondary | ICD-10-CM | POA: Diagnosis not present

## 2020-07-29 DIAGNOSIS — J449 Chronic obstructive pulmonary disease, unspecified: Secondary | ICD-10-CM | POA: Diagnosis not present

## 2020-07-29 DIAGNOSIS — I2510 Atherosclerotic heart disease of native coronary artery without angina pectoris: Secondary | ICD-10-CM | POA: Diagnosis not present

## 2020-07-29 DIAGNOSIS — N179 Acute kidney failure, unspecified: Secondary | ICD-10-CM | POA: Diagnosis not present

## 2020-08-01 ENCOUNTER — Other Ambulatory Visit: Payer: Self-pay

## 2020-08-01 ENCOUNTER — Encounter: Payer: Self-pay | Admitting: Nurse Practitioner

## 2020-08-01 ENCOUNTER — Other Ambulatory Visit: Payer: Self-pay | Admitting: *Deleted

## 2020-08-01 ENCOUNTER — Telehealth: Payer: Self-pay

## 2020-08-01 ENCOUNTER — Ambulatory Visit (INDEPENDENT_AMBULATORY_CARE_PROVIDER_SITE_OTHER): Payer: Medicare Other | Admitting: Nurse Practitioner

## 2020-08-01 ENCOUNTER — Encounter: Payer: Self-pay | Admitting: *Deleted

## 2020-08-01 VITALS — HR 108 | Temp 97.6°F | Resp 16 | Ht 73.0 in | Wt >= 6400 oz

## 2020-08-01 DIAGNOSIS — R0902 Hypoxemia: Secondary | ICD-10-CM | POA: Diagnosis not present

## 2020-08-01 DIAGNOSIS — R269 Unspecified abnormalities of gait and mobility: Secondary | ICD-10-CM

## 2020-08-01 DIAGNOSIS — R531 Weakness: Secondary | ICD-10-CM | POA: Diagnosis not present

## 2020-08-01 DIAGNOSIS — Z09 Encounter for follow-up examination after completed treatment for conditions other than malignant neoplasm: Secondary | ICD-10-CM

## 2020-08-01 DIAGNOSIS — F17210 Nicotine dependence, cigarettes, uncomplicated: Secondary | ICD-10-CM

## 2020-08-01 DIAGNOSIS — M5441 Lumbago with sciatica, right side: Secondary | ICD-10-CM

## 2020-08-01 DIAGNOSIS — J441 Chronic obstructive pulmonary disease with (acute) exacerbation: Secondary | ICD-10-CM | POA: Diagnosis not present

## 2020-08-01 DIAGNOSIS — R0602 Shortness of breath: Secondary | ICD-10-CM

## 2020-08-01 DIAGNOSIS — U071 COVID-19: Secondary | ICD-10-CM | POA: Diagnosis not present

## 2020-08-01 MED ORDER — TRAMADOL HCL 50 MG PO TABS: 50.0000 mg | ORAL_TABLET | Freq: Three times a day (TID) | ORAL | 0 refills | Status: DC | PRN

## 2020-08-01 MED ORDER — NICOTINE 21 MG/24HR TD PT24: 21.0000 mg | MEDICATED_PATCH | Freq: Every day | TRANSDERMAL | 0 refills | Status: DC

## 2020-08-01 NOTE — Progress Notes (Signed)
[facility] [address] [place], [place] [address]  Internal MEDICINE  Telephone Visit  Patient Name: Thomas Tanner  [id number]  [id number]  Date of Service: 08/01/2020   This is transitional care note after patient was hospitalized from 07/10/2020 to 07/18/2020 and again 07/20/2020 through 07/22/2020/ I connected with the patient at 2:49pm by webcam and verified the patients identity using two identifiers.   I discussed the limitations, risks, security and privacy concerns of performing an evaluation and management service by webcam and the availability of in person appointments. I also discussed with the patient that there may be a patient responsible charge related to the service.  The patient expressed understanding and agrees to proceed.    Chief Complaint  Patient presents with  . Hospitalization Follow-up    patches to help stop smoking, question about meds and some medical equipment  . Telephone Assessment    [phone]     video call  . Telephone Screen  . Quality Metric Gaps    HepC, TDAP, colonoscopy, covid vaccine    The patient has been contacted via webcam for follow up visit due to concerns for spread of novel coronavirus.  He was admitted to the hospital from 07/10/2020 through 07/18/2020 due to SOB and hypoxia. He had elevated troponin.  then again 8/11/021 to 07/22/2020 due to dehydration and hypotension. Initial visit, he had to be diuresed with IV furosemide, bronchodilators, oxygen and IV steroids. He was able to come off oxygen and discharged home. After he went home, he developed sores on his lower legs. Had to be wrapped in ER and was sent home. Three or four days after that, he had to go back to hospital due to dehydration from lasix treatment. Renal functions were severely impaired. These improved over the course of his hospital stay, though still not quite normal. He states that fluid in his legs has resolved, though there is still some swelling in  the legs. He continues to take antibiotics and will finish them over the next few days. The patient having significant shortness of breath with exertion. He is monitoring his blood oxygen level at home. He is running between 81 and 91%. Physical therapist, coming out to the house, states that he needs to be evaluated for home oxygen use. He  States that rescue inhaler is not doing him much good. States that his toes and fingers are getting cold and toes sometimes feel numb. Reviewing chest x-ray, he was negative for acute cardiopulmonary disease, but does have chronic pulmonary edema.  He has never seen pulmonology.       Current Medication: Outpatient Encounter Medications as of 08/01/2020  Medication Sig  . aspirin 81 MG chewable tablet Chew 1 tablet (81 mg total) by mouth daily.  . clopidogrel (PLAVIX) 75 MG tablet Take 1 tablet (75 mg total) by mouth daily.  . famotidine (PEPCID) 20 MG tablet Take 1 tablet (20 mg total) by mouth 2 (two) times daily. (Patient taking differently: Take 20 mg by mouth daily. )  . furosemide (LASIX) 20 MG tablet Take 1 tablet (20 mg total) by mouth daily.  Boris Lown Oil (OMEGA-3) 500 MG CAPS Take 1 tab po daily  . lisinopril (ZESTRIL) 10 MG tablet Take 1 tablet (10 mg total) by mouth daily.  . metoprolol succinate (TOPROL-XL) 50 MG 24 hr tablet Take 1 tablet (50 mg total) by mouth daily.  . rosuvastatin (CRESTOR) 20 MG tablet Take 1 tablet (20 mg total) by mouth daily.  Marland Kitchen  VENTOLIN HFA 108 (90 Base) MCG/ACT inhaler Inhale 2 puffs into the lungs every 6 (six) hours as needed for wheezing or shortness of breath.  . nicotine (NICODERM CQ - DOSED IN MG/24 HOURS) 21 mg/24hr patch Place 1 patch (21 mg total) onto the skin daily.  . traMADol (ULTRAM) 50 MG tablet Take 1 tablet (50 mg total) by mouth every 8 (eight) hours as needed.   No facility-administered encounter medications on file as of 08/01/2020.    Surgical History: Past Surgical History:  Procedure Laterality  Date  . CORONARY/GRAFT ACUTE MI REVASCULARIZATION N/A 12/08/2018   Procedure: Coronary/Graft Acute MI Revascularization;  Surgeon: Alwyn Pea, MD;  Location: [facility] INVASIVE CV LAB;  Service: Cardiovascular;  Laterality: N/A;  . KIDNEY STONE SURGERY    . left arm surgery  1963  . LEFT HEART CATH AND CORONARY ANGIOGRAPHY N/A 12/08/2018   Procedure: LEFT HEART CATH AND CORONARY ANGIOGRAPHY;  Surgeon: Alwyn Pea, MD;  Location: [facility] INVASIVE CV LAB;  Service: Cardiovascular;  Laterality: N/A;    Medical History: Past Medical History:  Diagnosis Date  . Arthritis   . COPD (chronic obstructive pulmonary disease) (HCC)   . Diabetes (HCC)   . Hyperlipidemia   . Hypertension   . Kidney stone   . Obesity   . Tachycardia   . TIA (transient ischemic attack)     Family History: Family History  Problem Relation Age of Onset  . Alzheimer's disease Mother   . CAD Father     Social History   Socioeconomic History  . Marital status: Married    Spouse name: Not on file  . Number of children: Not on file  . Years of education: Not on file  . Highest education level: Not on file  Occupational History  . Not on file  Tobacco Use  . Smoking status: Current Every Day Smoker    Packs/day: 0.50    Types: Cigarettes  . Smokeless tobacco: Never Used  . Tobacco comment: pt has cut back on amount and currently using the patch to assist with quiting  Substance and Sexual Activity  . Alcohol use: No  . Drug use: No  . Sexual activity: Not Currently  Other Topics Concern  . Not on file  Social History Narrative  . Not on file   Social Determinants of Health   Financial Resource Strain:   . Difficulty of Paying Living Expenses: Not on file  Food Insecurity: No Food Insecurity  . Worried About Programme researcher, broadcasting/film/video in the Last Year: Never true  . Ran Out of Food in the Last Year: Never true  Transportation Needs: No Transportation Needs  . Lack of Transportation (Medical): No   . Lack of Transportation (Non-Medical): No  Physical Activity:   . Days of Exercise per Week: Not on file  . Minutes of Exercise per Session: Not on file  Stress:   . Feeling of Stress : Not on file  Social Connections:   . Frequency of Communication with Friends and Family: Not on file  . Frequency of Social Gatherings with Friends and Family: Not on file  . Attends Religious Services: Not on file  . Active Member of Clubs or Organizations: Not on file  . Attends Banker Meetings: Not on file  . Marital Status: Not on file  Intimate Partner Violence:   . Fear of Current or Ex-Partner: Not on file  . Emotionally Abused: Not on file  . Physically Abused: Not  on file  . Sexually Abused: Not on file      Review of Systems  Constitutional: Positive for activity change and fatigue. Negative for chills and unexpected weight change.       Patient having difficult time walking, even short distances in the home. Unable to get to the bathroom without gasping for air. Has been having this symptom since he was in the hospital.   HENT: Positive for postnasal drip. Negative for congestion, rhinorrhea, sneezing and sore throat.   Respiratory: Positive for cough, chest tightness, shortness of breath and wheezing.   Cardiovascular: Positive for chest pain and leg swelling. Negative for palpitations.  Gastrointestinal: Negative for abdominal pain, constipation, diarrhea, nausea and vomiting.  Genitourinary: Positive for urgency.  Musculoskeletal: Positive for arthralgias, back pain and gait problem. Negative for joint swelling.       Unable to take usually prescribed meloxicam due to acute kidney injury from dehydration and treatment with IV lasix.   Skin: Negative for rash.  Allergic/Immunologic: Negative for environmental allergies.  Neurological: Positive for weakness. Negative for tremors and numbness.  Hematological: Negative for adenopathy. Does not bruise/bleed easily.   Psychiatric/Behavioral: Negative for behavioral problems (Depression), sleep disturbance and suicidal ideas. The patient is nervous/anxious.     Today's Vitals   08/01/20 1425  Pulse: (!) 108  Resp: 16  Temp: 97.6 F (36.4 C)  SpO2: (!) 88%  Weight: (!) 420 lb (190.5 kg)  Height: 6\' 1"  (1.854 m)   Body mass index is 55.41 kg/m.  Observation/Objective:  The patient is alert and oriented. He is pleasant and answering all questions appropriately. Breathing is non-labored. He is in no acute distress. The patient is short of breath, unable to speak in complete sentences without gasping for air. He is coughing throughout the visit and voice is hoarse.    Assessment/Plan:  1. Hospital discharge follow-up Patient hospitalized from 07/10/2020 to 07/18/2020 and again from 07/20/2020 to 8/13/021. All labs, imaging studies, and progress notes wre reviewed with the patient.  2. COPD with acute exacerbation (HCC) Patient continues to have COPD exacerbation with severe sob with exertion. Chest x-ray showing no acute abnormalities. Does have chronic pulmonary edema. Will discuss referral to pulmonary at next visit.   3. Shortness of breath Order for home oxygen made and sent to home health provider. Patient advised, if shortness of breath gets worse and he cannot catch his breath, he should return, immediately, to the ER for further evaluation. He voiced understanding and agreement.  - For home use only DME oxygen  4. Hypoxia Order for home oxygen made and sent to home health provider. Patient advised, if shortness of breath gets worse and he cannot catch his breath, he should return, immediately, to the ER for further evaluation. He voiced understanding and agreement.   5. Weakness Orders placed for bedside commode, shower chair and wheelchair sent to DME provider today.  - DME Bedside commode - Shower chair - DME Wheelchair manual  6. Abnormality of gait and mobility Orders placed for  bedside commode, shower chair and wheelchair sent to DME provider today.  - DME Bedside commode - Shower chair - DME Wheelchair manual  7. Low back pain with right-sided sciatica, unspecified back pain laterality, unspecified chronicity Unable to take normally prescribed meloxicam due to abnormal renal functions. Will give short term prescription for tramadol to take up to twice daily if needed for pain. Will reassess at next visit.  - traMADol (ULTRAM) 50 MG tablet; Take 1  tablet (50 mg total) by mouth every 8 (eight) hours as needed.  Dispense: 45 tablet; Refill: 0  8. Cigarette smoker motivated to quit nicoderm patches sent to pharmacy. Use one patch every 24 hours.  - nicotine (NICODERM CQ - DOSED IN MG/24 HOURS) 21 mg/24hr patch; Place 1 patch (21 mg total) onto the skin daily.  Dispense: 28 patch; Refill: 0  General Counseling: Van verbalizes understanding of the findings of today's phone visit and agrees with plan of treatment. I have discussed any further diagnostic evaluation that may be needed or ordered today. We also reviewed his medications today. he has been encouraged to call the office with any questions or concerns that should arise related to todays visit.  This patient was seen by Vincent Gros FNP Collaboration with Dr Lyndon Code as a part of collaborative care agreement  Orders Placed This Encounter  Procedures  . For home use only DME oxygen  . DME Bedside commode  . Shower chair  . DME Wheelchair manual    Meds ordered this encounter  Medications  . nicotine (NICODERM CQ - DOSED IN MG/24 HOURS) 21 mg/24hr patch    Sig: Place 1 patch (21 mg total) onto the skin daily.    Dispense:  28 patch    Refill:  0    Order Specific Question:   Supervising Provider    Answer:   Lyndon Code [[id number]]  . traMADol (ULTRAM) 50 MG tablet    Sig: Take 1 tablet (50 mg total) by mouth every 8 (eight) hours as needed.    Dispense:  45 tablet    Refill:  0    Order  Specific Question:   Supervising Provider    Answer:   Lyndon Code [[id number]]    Time spent: 59 Minutes    Dr Lyndon Code Internal medicine

## 2020-08-01 NOTE — Patient Outreach (Signed)
Received a Pharmacy referral.  I posted a referral in [facility] portal to [facility] .

## 2020-08-01 NOTE — Patient Outreach (Signed)
[facility] Customer service manager [facility]) Care Management [facility] CM Telephone Outreach, new referral PCP office completes Transition of Care follow up post-hospital discharge Post-hospital discharge day # 10  08/01/2020  Thomas Tanner 1951-09-17 [id number]  Successful (consecutive) third outreach attempt to  Thomas Tanner, 69 y/o male referred to [facility] CM by [facility] RN [facility] Liaison 07/19/20 for multiple recent hospital and ED visits.  Patient was recently hospitalized August 2-9, 2021 for acute respiratory failure with hypoxia/ CHF exacerbation with shortness of breath and significant peripheral edema. He was discharged home to self-care with home health services in place for PT/ OT. Patient has history including, but not limited to, dCHF; COPD; CKD-III; CAD; HTN/ HLD; DM' chronic pain; morbid obesity, pressure ulcer, and COVID-19 infection.  Patient re-presented to ED 07/20/20 for hypotension and Thomas Tanner was re-admitted to hospital August 11-13, 2095for hypotension and AKI; he was again discharged home to self-care with established home health services in place.  HIPAA/ identity verified; purpose of call/ [facility] CM services discussed with patient who provides verbal consent for [facility] CM involvement in his care post- recent hospital discharge.  Today, patient reports "doing better now," however, he describes an incident this morning where he had a decreased SaO2 while going to bathroom; states he has noticed recently that he has more episodes shortness of breath which take him longer to recover from than it previously did, pre-recent hospitalizations.  Reports that he is "better now," and pro-actively verbalizes plans to discuss his concerns with his PCP at time of scheduled in-persona office visit this afternoon.  Patient sounds to be in no obvious distress throughout call today.  -- actively participating in home health services for PT -- has started a list of DME he believes he may need at  home to discuss with PCP this afternoon:  Home O2; wheelchair, BSC; shower chair, etc- positive reinforcement provided for patient proactively making this list of items to discuss with PCP- coached patient around talking points to cover while at office visit this afternoon -- supportive family- with limited availability to assist patient with needs: patient provides verbal consent to speak with his son Thomas Tanner at any time if necessary; son and wife both work: patient's adult granddaughter is at home with him during day time hours and assists with care needs if indicated -- sleeps in recliner chair/ sofa; unable to weigh self due to morbid obesity, "scales don't go that high" -- initiated verbal education around signs/ symptoms CHF yellow zone/ corresponding action plan for same -- trying to quit smoking completely: reports down to 4-5 cigarettes/ day since last hospital discharge -- Reviewed recent and upcoming provider appointments with patient, who verbalizes plans to attend all as scheduled; son provides transportation -- No concerns around medications- self- manages: declines medication review today- "meds are not near me;" reports occasional difficulty affording inhaler- will place pharmacy referral -- No Advanced Directives: discussed basics of creating same; will place in mail to patient  Patient denies further issues, concerns, or problems today.  I provided/ confirmed that patient has my direct phone number, the main [facility] CM office phone number, and the [facility] CM 24-hour nurse advice phone number should issues arise prior to next scheduled [facility] CM outreach post-upcoming provider appointments.  Encouraged patient to contact me directly if needs, questions, issues, or concerns arise prior to next scheduled outreach; patient agreed to do so.  Plan:  Patient will take medications as prescribed and will attend all scheduled provider appointments  Patient will promptly  notify care providers for any  new concerns/ issues/ problems that arise  Patient will actively participate in home health services as ordered post-hospital discharge  Patient will review educational material mailed to him today around Advanced directives; smoking cessation; fall prevention  I will make patient's PCP aware of [facility] RN CM involvement in patient's care-- will send barriers letter  [facility] Community CM outreach to continue with scheduled phone call next week, post-provider appointments  I appreciate the opportunity to participate in [name]'s care,  Caryl Pina, RN, BSN, [employer] Coordinator [facility] Care Management  [phone]

## 2020-08-01 NOTE — Telephone Encounter (Signed)
Confirmed and screened for 08-01-20 ov. 

## 2020-08-01 NOTE — Patient Outreach (Signed)
[facility]) Care Management [facility] CM Telephone Outreach Care Coordination  08/01/2020  Thomas Tanner [birth year] [id number]  Successful telephone outreach to Thomas Tanner, 69 y/o male referred to [facility] CM by [facility] RN [facility] Liaison8/10/21for multiple recent hospital and ED visits.  Patient was recently hospitalized August 2-9, 2021 for acute respiratory failure with hypoxia/ CHF exacerbation with shortness of breath and significant peripheral edema. He was discharged home to self-care with home health services in place for PT/ OT. Patient has history including, but not limited to, dCHF; COPD; CKD-III; CAD; HTN/ HLD; DM' chronic pain; morbid obesity, pressure ulcer, and COVID-19 infection.  Patient re-presented to ED 07/20/20 for hypotension and [name] was re-admitted to hospital August 11-13, 2074for hypotension and AKI; he was again discharged home to self-care with established home health services in place.  Patient contacted me this afternoon in follow up to our call this morning; he left voice message requesting call-back "after 3 pm;"  3:50 pm:  Contacted patient as requested; left HIPAA compliant voice message requesting call back  4:00 pm:  Patient contacted me and we successfully completed call; HIPAA/ identity verified; patient tells me he contacted his insurance provider after our conversation this morning and discussed his benefit for DME, per our conversation this morning; he said that he understands his benefits around DME and has asked his doctor to facilitate ordering same; patient tells me that while speaking with his insurance company, he began wondering if he might qualify for medicaid; we discussed the basics of medicaid qualification and I offered to place a referral for [facility] CSW, however, patient tells me that he does not believe he will qualify, based on his current income; he agrees to think about this and I assured him that should he  decide to look into his eligibility, I could make a referral to [facility] CSW-- he is agreeable to this, again stating that he will think about it and let me know  Patient also confirms that he has already heard from [facility] team around possible patient assistance with his inhaler- he is very appreciative of the outreach  Patient denies further issues, concerns, or problems today.  I re- confirmed that patient has my direct phone number, the main [facility] CM office phone number, and the [facility] CM 24-hour nurse advice phone number should issues arise prior to next scheduled [facility] Community CM outreach.  Encouraged patient to contact me directly if needs, questions, issues, or concerns arise prior to next scheduled outreach; patient agreed to do so.  Plan:  Will outreach patient again as previously scheduled, unless indicated sooner  Caryl Pina, RN, BSN, [employer] Coordinator [facility] Care Management  [phone]

## 2020-08-01 NOTE — Telephone Encounter (Signed)
Faxed O2,potty chair and manual wheelchair  order to [facility] and also spoke with [facility] that they need call us tomorrow with vital sign and also advised pt that his symptoms gets worse need to go back to hospital or cal 911

## 2020-08-02 ENCOUNTER — Inpatient Hospital Stay
Admission: EM | Admit: 2020-08-02 | Discharge: 2020-08-07 | DRG: 163 | Disposition: A | Discharge status: Home Health | Payer: Medicare Other | Attending: Internal Medicine | Admitting: Internal Medicine

## 2020-08-02 ENCOUNTER — Encounter: Payer: Self-pay | Admitting: Emergency Medicine

## 2020-08-02 ENCOUNTER — Other Ambulatory Visit: Payer: Self-pay

## 2020-08-02 ENCOUNTER — Emergency Department: Payer: Medicare Other

## 2020-08-02 ENCOUNTER — Telehealth: Payer: Self-pay

## 2020-08-02 ENCOUNTER — Inpatient Hospital Stay: Payer: Medicare Other

## 2020-08-02 DIAGNOSIS — I2692 Saddle embolus of pulmonary artery without acute cor pulmonale: Secondary | ICD-10-CM | POA: Diagnosis not present

## 2020-08-02 DIAGNOSIS — Z743 Need for continuous supervision: Secondary | ICD-10-CM | POA: Diagnosis not present

## 2020-08-02 DIAGNOSIS — I2510 Atherosclerotic heart disease of native coronary artery without angina pectoris: Secondary | ICD-10-CM | POA: Diagnosis present

## 2020-08-02 DIAGNOSIS — E1122 Type 2 diabetes mellitus with diabetic chronic kidney disease: Secondary | ICD-10-CM | POA: Diagnosis not present

## 2020-08-02 DIAGNOSIS — I700 Atherosclerosis of aorta: Secondary | ICD-10-CM | POA: Diagnosis not present

## 2020-08-02 DIAGNOSIS — Z79899 Other long term (current) drug therapy: Secondary | ICD-10-CM | POA: Diagnosis not present

## 2020-08-02 DIAGNOSIS — I10 Essential (primary) hypertension: Secondary | ICD-10-CM | POA: Diagnosis not present

## 2020-08-02 DIAGNOSIS — Z955 Presence of coronary angioplasty implant and graft: Secondary | ICD-10-CM | POA: Diagnosis not present

## 2020-08-02 DIAGNOSIS — J9601 Acute respiratory failure with hypoxia: Secondary | ICD-10-CM | POA: Diagnosis not present

## 2020-08-02 DIAGNOSIS — I82431 Acute embolism and thrombosis of right popliteal vein: Secondary | ICD-10-CM | POA: Diagnosis not present

## 2020-08-02 DIAGNOSIS — Z820 Family history of epilepsy and other diseases of the nervous system: Secondary | ICD-10-CM | POA: Diagnosis not present

## 2020-08-02 DIAGNOSIS — J449 Chronic obstructive pulmonary disease, unspecified: Secondary | ICD-10-CM | POA: Diagnosis present

## 2020-08-02 DIAGNOSIS — I110 Hypertensive heart disease with heart failure: Secondary | ICD-10-CM | POA: Diagnosis not present

## 2020-08-02 DIAGNOSIS — J9621 Acute and chronic respiratory failure with hypoxia: Secondary | ICD-10-CM | POA: Diagnosis present

## 2020-08-02 DIAGNOSIS — D72829 Elevated white blood cell count, unspecified: Secondary | ICD-10-CM | POA: Diagnosis present

## 2020-08-02 DIAGNOSIS — L03115 Cellulitis of right lower limb: Secondary | ICD-10-CM | POA: Diagnosis not present

## 2020-08-02 DIAGNOSIS — I872 Venous insufficiency (chronic) (peripheral): Secondary | ICD-10-CM | POA: Diagnosis not present

## 2020-08-02 DIAGNOSIS — Z7982 Long term (current) use of aspirin: Secondary | ICD-10-CM

## 2020-08-02 DIAGNOSIS — I499 Cardiac arrhythmia, unspecified: Secondary | ICD-10-CM | POA: Diagnosis not present

## 2020-08-02 DIAGNOSIS — Z7901 Long term (current) use of anticoagulants: Secondary | ICD-10-CM | POA: Diagnosis not present

## 2020-08-02 DIAGNOSIS — E785 Hyperlipidemia, unspecified: Secondary | ICD-10-CM | POA: Diagnosis not present

## 2020-08-02 DIAGNOSIS — Z23 Encounter for immunization: Secondary | ICD-10-CM | POA: Diagnosis not present

## 2020-08-02 DIAGNOSIS — Z9981 Dependence on supplemental oxygen: Secondary | ICD-10-CM | POA: Diagnosis not present

## 2020-08-02 DIAGNOSIS — I130 Hypertensive heart and chronic kidney disease with heart failure and stage 1 through stage 4 chronic kidney disease, or unspecified chronic kidney disease: Secondary | ICD-10-CM | POA: Diagnosis not present

## 2020-08-02 DIAGNOSIS — J811 Chronic pulmonary edema: Secondary | ICD-10-CM | POA: Diagnosis not present

## 2020-08-02 DIAGNOSIS — R0602 Shortness of breath: Secondary | ICD-10-CM

## 2020-08-02 DIAGNOSIS — F17210 Nicotine dependence, cigarettes, uncomplicated: Secondary | ICD-10-CM | POA: Diagnosis present

## 2020-08-02 DIAGNOSIS — E875 Hyperkalemia: Secondary | ICD-10-CM | POA: Diagnosis present

## 2020-08-02 DIAGNOSIS — N1831 Chronic kidney disease, stage 3a: Secondary | ICD-10-CM | POA: Diagnosis not present

## 2020-08-02 DIAGNOSIS — Z86711 Personal history of pulmonary embolism: Secondary | ICD-10-CM | POA: Diagnosis not present

## 2020-08-02 DIAGNOSIS — J441 Chronic obstructive pulmonary disease with (acute) exacerbation: Secondary | ICD-10-CM | POA: Diagnosis present

## 2020-08-02 DIAGNOSIS — I509 Heart failure, unspecified: Secondary | ICD-10-CM

## 2020-08-02 DIAGNOSIS — Z7902 Long term (current) use of antithrombotics/antiplatelets: Secondary | ICD-10-CM

## 2020-08-02 DIAGNOSIS — I493 Ventricular premature depolarization: Secondary | ICD-10-CM

## 2020-08-02 DIAGNOSIS — I517 Cardiomegaly: Secondary | ICD-10-CM | POA: Diagnosis not present

## 2020-08-02 DIAGNOSIS — I890 Lymphedema, not elsewhere classified: Secondary | ICD-10-CM | POA: Diagnosis not present

## 2020-08-02 DIAGNOSIS — M79606 Pain in leg, unspecified: Secondary | ICD-10-CM

## 2020-08-02 DIAGNOSIS — N179 Acute kidney failure, unspecified: Secondary | ICD-10-CM | POA: Diagnosis not present

## 2020-08-02 DIAGNOSIS — Z8616 Personal history of COVID-19: Secondary | ICD-10-CM | POA: Diagnosis not present

## 2020-08-02 DIAGNOSIS — R0902 Hypoxemia: Secondary | ICD-10-CM

## 2020-08-02 DIAGNOSIS — Z791 Long term (current) use of non-steroidal anti-inflammatories (NSAID): Secondary | ICD-10-CM | POA: Diagnosis not present

## 2020-08-02 DIAGNOSIS — Z885 Allergy status to narcotic agent status: Secondary | ICD-10-CM

## 2020-08-02 DIAGNOSIS — Z8249 Family history of ischemic heart disease and other diseases of the circulatory system: Secondary | ICD-10-CM

## 2020-08-02 DIAGNOSIS — E1151 Type 2 diabetes mellitus with diabetic peripheral angiopathy without gangrene: Secondary | ICD-10-CM | POA: Diagnosis not present

## 2020-08-02 DIAGNOSIS — I5032 Chronic diastolic (congestive) heart failure: Secondary | ICD-10-CM | POA: Diagnosis not present

## 2020-08-02 DIAGNOSIS — I2602 Saddle embolus of pulmonary artery with acute cor pulmonale: Secondary | ICD-10-CM | POA: Diagnosis not present

## 2020-08-02 DIAGNOSIS — Z6841 Body Mass Index (BMI) 40.0 and over, adult: Secondary | ICD-10-CM

## 2020-08-02 DIAGNOSIS — I959 Hypotension, unspecified: Secondary | ICD-10-CM | POA: Diagnosis not present

## 2020-08-02 DIAGNOSIS — Z8673 Personal history of transient ischemic attack (TIA), and cerebral infarction without residual deficits: Secondary | ICD-10-CM | POA: Diagnosis not present

## 2020-08-02 DIAGNOSIS — R231 Pallor: Secondary | ICD-10-CM | POA: Diagnosis not present

## 2020-08-02 DIAGNOSIS — I5033 Acute on chronic diastolic (congestive) heart failure: Secondary | ICD-10-CM | POA: Diagnosis not present

## 2020-08-02 DIAGNOSIS — R0689 Other abnormalities of breathing: Secondary | ICD-10-CM | POA: Diagnosis not present

## 2020-08-02 DIAGNOSIS — R778 Other specified abnormalities of plasma proteins: Secondary | ICD-10-CM

## 2020-08-02 DIAGNOSIS — Z87442 Personal history of urinary calculi: Secondary | ICD-10-CM

## 2020-08-02 DIAGNOSIS — Z720 Tobacco use: Secondary | ICD-10-CM | POA: Diagnosis present

## 2020-08-02 LAB — BASIC METABOLIC PANEL
Anion gap: 16 — ABNORMAL HIGH (ref 5–15)
BUN: 21 mg/dL (ref 8–23)
CO2: 26 mmol/L (ref 22–32)
Calcium: 9 mg/dL (ref 8.9–10.3)
Chloride: 95 mmol/L — ABNORMAL LOW (ref 98–111)
Creatinine, Ser: 1.59 mg/dL — ABNORMAL HIGH (ref 0.61–1.24)
GFR calc Af Amer: 51 mL/min — ABNORMAL LOW (ref >60)
GFR calc non Af Amer: 44 mL/min — ABNORMAL LOW (ref >60)
Glucose, Bld: 140 mg/dL — ABNORMAL HIGH (ref 70–99)
Potassium: 3.9 mmol/L (ref 3.5–5.1)
Sodium: 137 mmol/L (ref 135–145)

## 2020-08-02 LAB — CBC
HCT: 41.8 % (ref 39.0–52.0)
Hemoglobin: 14.3 g/dL (ref 13.0–17.0)
MCH: 32.1 pg (ref 26.0–34.0)
MCHC: 34.2 g/dL (ref 30.0–36.0)
MCV: 93.7 fL (ref 80.0–100.0)
Platelets: 212 10*3/uL (ref 150–400)
RBC: 4.46 MIL/uL (ref 4.22–5.81)
RDW: 13.4 % (ref 11.5–15.5)
WBC: 11.4 10*3/uL — ABNORMAL HIGH (ref 4.0–10.5)
nRBC: 0 % (ref 0.0–0.2)

## 2020-08-02 LAB — SARS CORONAVIRUS 2 BY RT PCR (HOSPITAL ORDER, PERFORMED IN ~~LOC~~ HOSPITAL LAB): SARS Coronavirus 2: NEGATIVE

## 2020-08-02 LAB — BRAIN NATRIURETIC PEPTIDE: B Natriuretic Peptide: 503.4 pg/mL — ABNORMAL HIGH (ref 0.0–100.0)

## 2020-08-02 LAB — PROTIME-INR
INR: 1.1 (ref 0.8–1.2)
Prothrombin Time: 13.9 seconds (ref 11.4–15.2)

## 2020-08-02 LAB — MAGNESIUM: Magnesium: 2.1 mg/dL (ref 1.7–2.4)

## 2020-08-02 LAB — APTT: aPTT: 31 seconds (ref 24–36)

## 2020-08-02 LAB — TROPONIN I (HIGH SENSITIVITY)
Troponin I (High Sensitivity): 43 ng/L — ABNORMAL HIGH (ref <18)
Troponin I (High Sensitivity): 35 ng/L — ABNORMAL HIGH (ref <18)

## 2020-08-02 MED ORDER — AZITHROMYCIN 500 MG PO TABS
500.0000 mg | ORAL_TABLET | Freq: Every day | ORAL | Status: DC
  Administered 2020-08-02 – 2020-08-03 (×2): 500 mg via ORAL
  Filled 2020-08-02 (×2): qty 1

## 2020-08-02 MED ORDER — FUROSEMIDE 10 MG/ML IJ SOLN
20.0000 mg | Freq: Once | INTRAMUSCULAR | Status: AC
  Administered 2020-08-02: 20 mg via INTRAVENOUS
  Filled 2020-08-02: qty 4

## 2020-08-02 MED ORDER — SODIUM CHLORIDE 0.9 % IV SOLN: 500.0000 mg | INTRAVENOUS | Status: DC

## 2020-08-02 MED ORDER — IPRATROPIUM-ALBUTEROL 0.5-2.5 (3) MG/3ML IN SOLN
3.0000 mL | Freq: Four times a day (QID) | RESPIRATORY_TRACT | Status: DC
  Administered 2020-08-02 – 2020-08-07 (×11): 3 mL via RESPIRATORY_TRACT
  Filled 2020-08-02 (×12): qty 3

## 2020-08-02 MED ORDER — FUROSEMIDE 10 MG/ML IJ SOLN: 20.0000 mg | Freq: Two times a day (BID) | INTRAMUSCULAR | Status: DC

## 2020-08-02 MED ORDER — HEPARIN BOLUS VIA INFUSION
6000.0000 [IU] | Freq: Once | INTRAVENOUS | Status: AC
  Administered 2020-08-02: 6000 [IU] via INTRAVENOUS
  Filled 2020-08-02: qty 6000

## 2020-08-02 MED ORDER — SODIUM CHLORIDE 0.9% FLUSH
3.0000 mL | Freq: Two times a day (BID) | INTRAVENOUS | Status: DC
  Administered 2020-08-02 – 2020-08-07 (×9): 3 mL via INTRAVENOUS

## 2020-08-02 MED ORDER — ASPIRIN 81 MG PO CHEW: 81.0000 mg | CHEWABLE_TABLET | Freq: Every day | ORAL | Status: DC

## 2020-08-02 MED ORDER — SODIUM CHLORIDE 0.9 % IV SOLN: 250.0000 mL | INTRAVENOUS | Status: DC | PRN

## 2020-08-02 MED ORDER — ACETAMINOPHEN 325 MG PO TABS: 650.0000 mg | ORAL_TABLET | ORAL | Status: DC | PRN

## 2020-08-02 MED ORDER — ENOXAPARIN SODIUM 40 MG/0.4ML ~~LOC~~ SOLN: 40.0000 mg | SUBCUTANEOUS | Status: DC

## 2020-08-02 MED ORDER — LISINOPRIL 10 MG PO TABS
10.0000 mg | ORAL_TABLET | Freq: Every day | ORAL | Status: DC
  Filled 2020-08-02: qty 2

## 2020-08-02 MED ORDER — ONDANSETRON HCL 4 MG/2ML IJ SOLN
4.0000 mg | Freq: Four times a day (QID) | INTRAMUSCULAR | Status: DC | PRN
  Administered 2020-08-05: 4 mg via INTRAVENOUS
  Filled 2020-08-02: qty 2

## 2020-08-02 MED ORDER — FAMOTIDINE 20 MG PO TABS
20.0000 mg | ORAL_TABLET | Freq: Every day | ORAL | Status: DC
  Administered 2020-08-03 – 2020-08-07 (×5): 20 mg via ORAL
  Filled 2020-08-02 (×5): qty 1

## 2020-08-02 MED ORDER — METOPROLOL SUCCINATE ER 50 MG PO TB24
50.0000 mg | ORAL_TABLET | Freq: Every day | ORAL | Status: DC
  Administered 2020-08-03 – 2020-08-07 (×5): 50 mg via ORAL
  Filled 2020-08-02 (×5): qty 1

## 2020-08-02 MED ORDER — CLOPIDOGREL BISULFATE 75 MG PO TABS: 75.0000 mg | ORAL_TABLET | Freq: Every day | ORAL | Status: DC

## 2020-08-02 MED ORDER — HEPARIN (PORCINE) 25000 UT/250ML-% IV SOLN
2000.0000 [IU]/h | INTRAVENOUS | Status: DC
  Administered 2020-08-02: 2000 [IU]/h via INTRAVENOUS
  Filled 2020-08-02 (×2): qty 250

## 2020-08-02 MED ORDER — IPRATROPIUM-ALBUTEROL 0.5-2.5 (3) MG/3ML IN SOLN
3.0000 mL | Freq: Once | RESPIRATORY_TRACT | Status: AC
  Administered 2020-08-02: 3 mL via RESPIRATORY_TRACT
  Filled 2020-08-02: qty 3

## 2020-08-02 MED ORDER — SODIUM CHLORIDE 0.9% FLUSH: 3.0000 mL | INTRAVENOUS | Status: DC | PRN

## 2020-08-02 MED ORDER — PREDNISONE 20 MG PO TABS
40.0000 mg | ORAL_TABLET | Freq: Every day | ORAL | Status: DC
  Administered 2020-08-03: 40 mg via ORAL
  Filled 2020-08-02: qty 2

## 2020-08-02 MED ORDER — IOHEXOL 350 MG/ML SOLN
75.0000 mL | Freq: Once | INTRAVENOUS | Status: AC | PRN
  Administered 2020-08-02: 75 mL via INTRAVENOUS

## 2020-08-02 MED ORDER — AZITHROMYCIN 500 MG PO TABS: 500.0000 mg | ORAL_TABLET | Freq: Every day | ORAL | Status: DC

## 2020-08-02 MED ORDER — ROSUVASTATIN CALCIUM 10 MG PO TABS
20.0000 mg | ORAL_TABLET | Freq: Every day | ORAL | Status: DC
  Administered 2020-08-03 – 2020-08-06 (×4): 20 mg via ORAL
  Filled 2020-08-02 (×4): qty 2

## 2020-08-02 NOTE — Consult Note (Signed)
ANTICOAGULATION CONSULT NOTE   Pharmacy Consult for Heparin Indication: pulmonary embolus  Allergies  Allergen Reactions  . Morphine Anxiety and Other (See Comments)    Hallucinations  . Morphine And Related Anxiety    Patient Measurements: Height: 6\' 1"  (185.4 cm) Weight: (!) 185.3 kg (408 lb 8.2 oz) IBW/kg (Calculated) : 79.9 Heparin Dosing Weight: 127.1 kg  Vital Signs: Temp: 97.7 F (36.5 C) (08/24 1056) Temp Source: Oral (08/24 1056) BP: 109/68 (08/24 1530) Pulse Rate: 97 (08/24 1420)  Labs: Recent Labs    08/02/20 1107 08/02/20 1305  HGB 14.3  --   HCT 41.8  --   PLT 212  --   CREATININE 1.59*  --   TROPONINIHS 43* 35*    Estimated Creatinine Clearance: 77.9 mL/min (A) (by C-G formula based on SCr of 1.59 mg/dL (H)).   Medical History: Past Medical History:  Diagnosis Date  . Arthritis   . COPD (chronic obstructive pulmonary disease) (HCC)   . Diabetes (HCC)   . Hyperlipidemia   . Hypertension   . Kidney stone   . Obesity   . Tachycardia   . TIA (transient ischemic attack)     Medications:  (Not in a hospital admission)  Scheduled:  Infusions:  PRN:  Anti-infectives (From admission, onward)   None      Assessment: Pharmacy was consulted to start heparin for a PE. No DOAC PTA noted. CBC stable.    Goal of Therapy:  Heparin level 0.3-0.7 units/ml Monitor platelets by anticoagulation protocol: Yes   Plan:  Give 6000 units bolus x 1 Start heparin infusion at 2000 units/hr Check anti-Xa level in 6 hours and daily while on heparin Continue to monitor H&H and platelets  08/04/20, PharmD, BCPS 08/02/2020,4:59 PM

## 2020-08-02 NOTE — ED Triage Notes (Signed)
Pt to ed with c/o shortness of breath since Saturday AM. Pt denies CP. Pt tachypneic at triage. EKG done on arrival to triage room. Pt alert and oriented. Pt reports positive COVID in April. Pt denies getting COVID vaccine.

## 2020-08-02 NOTE — Progress Notes (Signed)
[facility]   Consult received. Patient with PE (right heart strain noted on CTA). Vitals stable. Initiate heparin gtt Will plan on pulmonary thrombectomy / thrombolysis in the AM. Full consult to follow.   Discussed with Dr. Wallis Mart Emanual Lamountain PA-C 08/02/2020 6:09 PM

## 2020-08-02 NOTE — ED Notes (Signed)
Admitting MD at bedside.

## 2020-08-02 NOTE — ED Notes (Signed)
Patient transported to CT 

## 2020-08-02 NOTE — ED Provider Notes (Signed)
This patient was signed out to me at approximately 1500 today.  Plan is to admit to medicine service with diagnosis of acute hypoxic respiratory failure possibly secondary to CHF.  However on my review patient's presentation and work-up I believe is hypoxia is out of proportion to findings on x-ray.  CTA ordered to assess for evidence of PE.  CTA does show large saddle PE with evidence of right heart strain.  I did discuss these findings with radiology as well as the hospitalist admitting the patient.  I ordered heparin for the patient.  I reviewed monitored and reviewed his telemetry while he was in the emergency room.  .Critical Care Performed by: Gilles Chiquito, MD Authorized by: Gilles Chiquito, MD   Critical care provider statement:    Critical care time (minutes):  33   Critical care was necessary to treat or prevent imminent or life-threatening deterioration of the following conditions:  Respiratory failure and circulatory failure   Critical care was time spent personally by me on the following activities:  Discussions with consultants, evaluation of patient's response to treatment, examination of patient, ordering and performing treatments and interventions, ordering and review of laboratory studies, ordering and review of radiographic studies, pulse oximetry, re-evaluation of patient's condition, obtaining history from patient or surrogate and review of old charts    Medications  lisinopril (ZESTRIL) tablet 10 mg (has no administration in time range)  metoprolol succinate (TOPROL-XL) 24 hr tablet 50 mg (has no administration in time range)  rosuvastatin (CRESTOR) tablet 20 mg (has no administration in time range)  famotidine (PEPCID) tablet 20 mg (has no administration in time range)  sodium chloride flush (NS) 0.9 % injection 3 mL (has no administration in time range)  sodium chloride flush (NS) 0.9 % injection 3 mL (has no administration in time range)  0.9 %  sodium chloride  infusion (has no administration in time range)  acetaminophen (TYLENOL) tablet 650 mg (has no administration in time range)  ondansetron (ZOFRAN) injection 4 mg (has no administration in time range)  predniSONE (DELTASONE) tablet 40 mg (has no administration in time range)  ipratropium-albuterol (DUONEB) 0.5-2.5 (3) MG/3ML nebulizer solution 3 mL (has no administration in time range)  heparin ADULT infusion 100 units/mL (25000 units/223mL sodium chloride 0.45%) (2,000 Units/hr Intravenous New Bag/Given 08/02/20 1736)  azithromycin (ZITHROMAX) tablet 500 mg (500 mg Oral Given 08/02/20 1835)  ipratropium-albuterol (DUONEB) 0.5-2.5 (3) MG/3ML nebulizer solution 3 mL (3 mLs Nebulization Given 08/02/20 1209)  furosemide (LASIX) injection 20 mg (20 mg Intravenous Given 08/02/20 1244)  furosemide (LASIX) injection 20 mg (20 mg Intravenous Given 08/02/20 1548)  iohexol (OMNIPAQUE) 350 MG/ML injection 75 mL (75 mLs Intravenous Contrast Given 08/02/20 1638)  heparin bolus via infusion 6,000 Units (6,000 Units Intravenous Bolus from Bag 08/02/20 1736)      Gilles Chiquito, MD 08/02/20 1900

## 2020-08-02 NOTE — ED Provider Notes (Signed)
[facility] Emergency Department Provider Note   ____________________________________________   First MD Initiated Contact with Patient 08/02/20 1149     (approximate)  I have reviewed the triage vital signs and the nursing notes.   HISTORY  Chief Complaint Shortness of Breath    HPI Thomas Tanner is a 69 y.o. male patient reports shortness of breath since Saturday morning.  No chest pain.  He has a little cough with mostly nonproductive.  He says he had Covid in April.  He has congestive heart failure and has a little more swelling in his legs than usual.  He reports he is usually on 3 L of oxygen at home but had to turn it up to 4 L to feel like he was getting any.  O2 sats were about 90 on 4 L and dropped whenever he moves.  He said before he turned it up to 4 L he was running 88% sat.  EMS put him on 100% nonrebreather and got 97% sat.  He has not been eating salt or processed foods.  He is not had any chest pain.           Past Medical History:  Diagnosis Date  . Arthritis   . COPD (chronic obstructive pulmonary disease) (HCC)   . Diabetes (HCC)   . Hyperlipidemia   . Hypertension   . Kidney stone   . Obesity   . Tachycardia   . TIA (transient ischemic attack)     Patient Active Problem List   Diagnosis Date Noted  . Hypoxia 08/01/2020  . Weakness 08/01/2020  . Abnormality of gait and mobility 08/01/2020  . AKI (acute kidney injury) (HCC) 07/21/2020  . Hypotension 07/20/2020  . Leukocytosis 07/20/2020  . Cellulitis of right lower extremity 07/20/2020  . Pressure injury of skin 07/12/2020  . Acute respiratory failure with hypoxia (HCC) 07/10/2020  . COPD with acute exacerbation (HCC)   . Elevated troponin   . Chronic diastolic CHF (congestive heart failure) (HCC) 05/24/2020  . Anasarca   . Acute renal failure superimposed on stage 3a chronic kidney disease (HCC)   . Hyperlipidemia   . Tobacco abuse   . Shortness of  breath 04/28/2020  . Upper respiratory tract infection due to COVID-19 virus 04/28/2020  . Edema 04/28/2020  . Encounter for general adult medical examination with abnormal findings 01/15/2019  . Acute non-recurrent maxillary sinusitis 01/15/2019  . Gastroesophageal reflux disease without esophagitis 01/15/2019  . CAD in native artery 12/16/2018  . Morbid obesity with BMI of 50.0-59.9, adult (HCC) 12/16/2018  . Cigarette smoker motivated to quit 12/16/2018  . Chronic pain of right knee 08/04/2018  . Mild intermittent asthma 08/04/2018  . Impaired fasting glucose 04/03/2018  . Low back pain with right-sided sciatica 03/05/2018  . Chronic right-sided low back pain with right-sided sciatica 03/05/2018  . Pain in right hip 03/05/2018  . Hypertension 03/05/2018  . Foreign body in bladder and urethra 06/09/2013  . Kidney stone 06/09/2013  . Ureteric stone 06/09/2013  . Kidney stone 06/09/2013    Past Surgical History:  Procedure Laterality Date  . CORONARY/GRAFT ACUTE MI REVASCULARIZATION N/A 12/08/2018   Procedure: Coronary/Graft Acute MI Revascularization;  Surgeon: Alwyn Pea, MD;  Location: [facility] INVASIVE CV LAB;  Service: Cardiovascular;  Laterality: N/A;  . KIDNEY STONE SURGERY    . left arm surgery  1963  . LEFT HEART CATH AND CORONARY ANGIOGRAPHY N/A 12/08/2018   Procedure: LEFT HEART CATH AND CORONARY ANGIOGRAPHY;  Surgeon: Alwyn Pea, MD;  Location: [facility] INVASIVE CV LAB;  Service: Cardiovascular;  Laterality: N/A;    Prior to Admission medications   Medication Sig Start Date End Date Taking? Authorizing Provider  aspirin 81 MG chewable tablet Chew 1 tablet (81 mg total) by mouth daily. 06/03/19   Carlean Jews, NP  clopidogrel (PLAVIX) 75 MG tablet Take 1 tablet (75 mg total) by mouth daily. 01/08/20   Carlean Jews, NP  famotidine (PEPCID) 20 MG tablet Take 1 tablet (20 mg total) by mouth 2 (two) times daily. Patient taking differently: Take 20 mg by  mouth daily.  03/09/20   Carlean Jews, NP  furosemide (LASIX) 20 MG tablet Take 1 tablet (20 mg total) by mouth daily. 07/23/20   Enedina Finner, MD  Boris Lown Oil (OMEGA-3) 500 MG CAPS Take 1 tab po daily 06/04/19   Carlean Jews, NP  lisinopril (ZESTRIL) 10 MG tablet Take 1 tablet (10 mg total) by mouth daily. 07/22/20   Enedina Finner, MD  metoprolol succinate (TOPROL-XL) 50 MG 24 hr tablet Take 1 tablet (50 mg total) by mouth daily. 02/08/20   Carlean Jews, NP  nicotine (NICODERM CQ - DOSED IN MG/24 HOURS) 21 mg/24hr patch Place 1 patch (21 mg total) onto the skin daily. 08/01/20   Carlean Jews, NP  rosuvastatin (CRESTOR) 20 MG tablet Take 1 tablet (20 mg total) by mouth daily. 05/16/20   Carlean Jews, NP  traMADol (ULTRAM) 50 MG tablet Take 1 tablet (50 mg total) by mouth Tanner 8 (eight) hours as needed. 08/01/20   Carlean Jews, NP  VENTOLIN HFA 108 (90 Base) MCG/ACT inhaler Inhale 2 puffs into the lungs Tanner 6 (six) hours as needed for wheezing or shortness of breath. 06/27/20   Carlean Jews, NP    Allergies Morphine and Morphine and related  Family History  Problem Relation Age of Onset  . Alzheimer's disease Mother   . CAD Father     Social History Social History   Tobacco Use  . Smoking status: Current Tanner Day Smoker    Packs/day: 0.50    Types: Cigarettes  . Smokeless tobacco: Never Used  . Tobacco comment: pt has cut back on amount and currently using the patch to assist with quiting  Substance Use Topics  . Alcohol use: No  . Drug use: No    Review of Systems  Constitutional: No fever/chills Eyes: No visual changes. ENT: No sore throat. Cardiovascular: Denies chest pain. Respiratory:  shortness of breath. Gastrointestinal: No abdominal pain.  No nausea, no vomiting.  No diarrhea.  No constipation. Genitourinary: Negative for dysuria. Musculoskeletal: Negative for back pain. Skin: Negative for rash. Neurological: Negative for headaches, focal  weakness   ____________________________________________   PHYSICAL EXAM:  VITAL SIGNS: ED Triage Vitals  Enc Vitals Group     BP 08/02/20 1056 99/66     Pulse Rate 08/02/20 1056 (!) 106     Resp 08/02/20 1056 (!) 27     Temp 08/02/20 1056 97.7 F (36.5 C)     Temp Source 08/02/20 1056 Oral     SpO2 08/02/20 1056 97 %     Weight --      Height 08/02/20 1102 6\' 1"  (1.854 m)     Head Circumference --      Peak Flow --      Pain Score 08/02/20 1102 0     Pain Loc --      Pain  Edu? --      Excl. in GC? --     Constitutional: Alert and oriented.  Pale appearing but in no acute distress. Eyes: Conjunctivae are normal.  Head: Atraumatic. Nose: No congestion/rhinnorhea. Mouth/Throat: Mucous membranes are moist.  Oropharynx non-erythematous. Neck: No stridor.   Cardiovascular: Normal rate, regular rhythm. Grossly normal heart sounds.   Respiratory: Normal respiratory effort.  No retractions. Lungs CTAB. Gastrointestinal: Soft and nontender. No distention. No abdominal bruits.  Musculoskeletal: No lower extremity tenderness bilateral 1+ edema.   Neurologic:  Normal speech and language. No gross focal neurologic deficits are appreciated.  Skin:  Skin is warm, dry and intact. No rash noted.   ____________________________________________   LABS (all labs ordered are listed, but only abnormal results are displayed)  Labs Reviewed  BASIC METABOLIC PANEL - Abnormal; Notable for the following components:      Result Value   Chloride 95 (*)    Glucose, Bld 140 (*)    Creatinine, Ser 1.59 (*)    GFR calc non Af Amer 44 (*)    GFR calc Af Amer 51 (*)    Anion gap 16 (*)    All other components within normal limits  CBC - Abnormal; Notable for the following components:   WBC 11.4 (*)    All other components within normal limits  BRAIN NATRIURETIC PEPTIDE - Abnormal; Notable for the following components:   B Natriuretic Peptide 503.4 (*)    All other components within normal  limits  TROPONIN I (HIGH SENSITIVITY) - Abnormal; Notable for the following components:   Troponin I (High Sensitivity) 43 (*)    All other components within normal limits  TROPONIN I (HIGH SENSITIVITY) - Abnormal; Notable for the following components:   Troponin I (High Sensitivity) 35 (*)    All other components within normal limits  SARS CORONAVIRUS 2 BY RT PCR (HOSPITAL ORDER, PERFORMED IN [facility])   ____________________________________________  EKG  EKG read interpreted by me shows sinus tachycardia rate of 103 normal axis QRS is wide at 108 ms no acute ST-T wave changes are seen ____________________________________________  RADIOLOGY  ED MD interpretatio  Official radiology report(s): DG Chest Port 1 View  Result Date: 08/02/2020 CLINICAL DATA:  Shortness of breath. EXAM: PORTABLE CHEST 1 VIEW COMPARISON:  July 20, 2020. FINDINGS: Mild cardiomegaly is noted with mild central pulmonary vascular congestion. No pneumothorax or pleural effusion is noted. Both lungs are clear. The visualized skeletal structures are unremarkable. IMPRESSION: Mild cardiomegaly with mild central pulmonary vascular congestion. Electronically Signed   By: Lupita Raider M.D.   On: 08/02/2020 12:22    ____________________________________________   PROCEDURES  Procedure(s) performed (including Critical Care): Critical care time 20 minutes.  This includes checking on him several times during his history and checking on his old records.  Additionally we had to get blood pressure on his left arm as his right arm blood pressure was very low.  Procedures   ____________________________________________   INITIAL IMPRESSION / ASSESSMENT AND PLAN / ED COURSE ----------------------------------------- 2:14 PM on 08/02/2020 -----------------------------------------  Patient took his Lasix at home and we gave him another 20 here.  He is put out almost a liter of fluid to this point but is  becoming more hypoxic and is now running only 85 on 5 L whereas before he was 90 on 5 L.  Nursing will get him on high flow oxygen.  ____________________________________________   FINAL CLINICAL IMPRESSION(S) / ED DIAGNOSES  Final diagnoses:  Hypoxia  Congestive heart failure, unspecified HF chronicity, unspecified heart failure type [facility](HCC)     ED Discharge Orders    None       Note:  This document was prepared using Dragon voice recognition software and may include unintentional dictation errors.    Arnaldo NatalMalinda, Tuyet Bader F, MD 08/02/20 1416

## 2020-08-02 NOTE — ED Notes (Signed)
Pt with increased WOB and increased O2 requirement when getting up bedside for weight; O2 increased to 6L/min per [place]

## 2020-08-02 NOTE — ED Triage Notes (Signed)
Pt in via EMS from home with c/o resp distress that started Saturday and got worse. Pt has home health RN. Pt with diminished lunch sounds and expiratory wheezing. RR in 40's. Pt with new dx of CHF 6 months ago. 90% on RA upon EMS arrival. 97% on NRB

## 2020-08-02 NOTE — ED Notes (Signed)
Pt returned from CT °

## 2020-08-02 NOTE — ED Notes (Addendum)
Pt with desats to 75% on 5L [place] while sleeping; obstructive sleeping sounds noted. Some improvement with O2 sats as he awoke but continued to be in the 80s. Placed HFNC on with 8L; improved to 92%. Will continue to monitor closely. Dr. Darnelle Catalan notified; no further changes at this time.

## 2020-08-02 NOTE — Telephone Encounter (Signed)
Samantha(Physical therapy ) [facility] that pt Oxygen with 4.5 litre is 89% and he is having bad cough and sob breath as per heather advised physical therapist pt need to go to ED and also spoke with pt that he need go to Ed as per pt EMT is there now

## 2020-08-02 NOTE — H&P (Signed)
[facility] History and Physical  Holly BodilyRichard Douglas Zhen ZOX:[id number] DOB: 04/14/1951 DOA: 08/02/2020  Referring physician: Dr. Darnelle CatalanMalinda PCP: Jenne PaneLlc, [facility]   Chief Complaint: shortness of breath  HPI: Holly BodilyRichard Douglas Darco is a 69 y.o. male with history of COPD on 3 L home O2, chronic diastolic CHF, hypertension, CAD, tobacco use, who presents with worsening shortness of breath.  Patient has been admitted twice already this month.  Admitted from August 1 August 9 for combined CHF and COPD exacerbations.  He was readmitted from August 11 August 13 for hypotension and AKI thought to be secondary to dehydration from diuretics and NSAID use.  BNP during last admission was 40.  Today patient presents with worsening shortness of breath.  He reports no missed medication since he was last discharged, and he specifically notes that he has not had any dietary indiscretions with regard to salt.  He does not believe he has been sick lately, his only symptom that he has noticed has been a worsening shortness of breath.  He denies any chest pain now or recently.  In the ED vital signs notable for mild tachycardia in the low 100s on arrival and tachypnea into the 20s.  As well as hypoxia down into the 80s that required high flow nasal cannula ultimately left at 8 L/min high flow nasal cannula.  Lab work-up notable for BMP with creatinine at baseline at 1.5, CBC unremarkable, BNP significantly elevated at 503 compared to 40 during last admission, elevated troponin at 43 that down trended to 35 over 2 hours.  Chest x-ray showed pulmonary vascular congestion.  Initial treatment focused on presumed diagnosis of heart failure exacerbation as well as COPD exacerbation, and he was given nebulizer treatments and IV Lasix.  Due to high oxygen requirements patient was ordered for CTPA which subsequently showed multiple large pulmonary emboli with evidence of right heart strain and he was started on  heparin drip.  He was admitted for further management of pulmonary embolism.  Review of Systems:  Pertinent positives and negative per HPI, all others reviewed and negative  Past Medical History:  Diagnosis Date  . Arthritis   . COPD (chronic obstructive pulmonary disease) (HCC)   . Diabetes (HCC)   . Hyperlipidemia   . Hypertension   . Kidney stone   . Obesity   . Tachycardia   . TIA (transient ischemic attack)    Past Surgical History:  Procedure Laterality Date  . CORONARY/GRAFT ACUTE MI REVASCULARIZATION N/A 12/08/2018   Procedure: Coronary/Graft Acute MI Revascularization;  Surgeon: Alwyn Peaallwood, Dwayne D, MD;  Location: [facility] INVASIVE CV LAB;  Service: Cardiovascular;  Laterality: N/A;  . KIDNEY STONE SURGERY    . left arm surgery  1963  . LEFT HEART CATH AND CORONARY ANGIOGRAPHY N/A 12/08/2018   Procedure: LEFT HEART CATH AND CORONARY ANGIOGRAPHY;  Surgeon: Alwyn Peaallwood, Dwayne D, MD;  Location: [facility] INVASIVE CV LAB;  Service: Cardiovascular;  Laterality: N/A;   Social History:  reports that he has been smoking cigarettes. He has been smoking about 0.50 packs per day. He has never used smokeless tobacco. He reports that he does not drink alcohol and does not use drugs.  Allergies  Allergen Reactions  . Morphine Anxiety and Other (See Comments)    Hallucinations  . Morphine And Related Anxiety    Family History  Problem Relation Age of Onset  . Alzheimer's disease Mother   . CAD Father     Prior to Admission medications   Medication Sig  Start Date End Date Taking? Authorizing Provider  aspirin 81 MG chewable tablet Chew 1 tablet (81 mg total) by mouth daily. 06/03/19  Yes Carlean Jews, NP  clopidogrel (PLAVIX) 75 MG tablet Take 1 tablet (75 mg total) by mouth daily. 01/08/20  Yes Boscia, Kathlynn Grate, NP  famotidine (PEPCID) 20 MG tablet Take 1 tablet (20 mg total) by mouth 2 (two) times daily. Patient taking differently: Take 20 mg by mouth daily.  03/09/20  Yes Boscia,  Kathlynn Grate, NP  furosemide (LASIX) 20 MG tablet Take 1 tablet (20 mg total) by mouth daily. 07/23/20  Yes Enedina Finner, MD  lisinopril (ZESTRIL) 10 MG tablet Take 1 tablet (10 mg total) by mouth daily. 07/22/20  Yes Enedina Finner, MD  meloxicam (MOBIC) 7.5 MG tablet Take 7.5 mg by mouth in the morning and at bedtime.    Yes [provider]  metoprolol succinate (TOPROL-XL) 50 MG 24 hr tablet Take 1 tablet (50 mg total) by mouth daily. 02/08/20  Yes Boscia, Kathlynn Grate, NP  rosuvastatin (CRESTOR) 20 MG tablet Take 1 tablet (20 mg total) by mouth daily. 05/16/20  Yes Boscia, Kathlynn Grate, NP  traMADol (ULTRAM) 50 MG tablet Take 1 tablet (50 mg total) by mouth every 8 (eight) hours as needed. 08/01/20  Yes Carlean Jews, NP  Krill Oil (OMEGA-3) 500 MG CAPS Take 1 tab po daily 06/04/19   Boscia, Kathlynn Grate, NP  nicotine (NICODERM CQ - DOSED IN MG/24 HOURS) 21 mg/24hr patch Place 1 patch (21 mg total) onto the skin daily. 08/01/20   Carlean Jews, NP  VENTOLIN HFA 108 (90 Base) MCG/ACT inhaler Inhale 2 puffs into the lungs every 6 (six) hours as needed for wheezing or shortness of breath. 06/27/20   Carlean Jews, NP   Physical Exam: Vitals:   08/02/20 1420 08/02/20 1530 08/02/20 1723 08/02/20 1730  BP: 104/77 109/68 (!) 114/98 (!) 109/94  Pulse: 97  92 92  Resp: (!) 23 (!) 26 17 (!) 21  Temp:      TempSrc:      SpO2: 92% 94% 93% 94%  Weight:      Height:        Wt Readings from Last 3 Encounters:  08/02/20 (!) 185.3 kg  08/01/20 (!) 190.5 kg  07/22/20 (!) 193.6 kg    General:  Morbidly obese Eyes: PERRL, normal lids, irises & conjunctiva ENT: grossly normal hearing, lips & tongue Neck: no masses Cardiovascular: regular, tachycardic, distant heart sounds. 2+ pitting edema up 2/3 of shins bilaterally. Telemetry: S tach, no arrhythmias  Respiratory: increased work of breathing, decreased breath sounds throughout with soft end expiratory wheezes Abdomen: soft, ntnd Skin: venous stasis  changes on bilateral shins Musculoskeletal: grossly normal tone BUE/BLE Psychiatric: grossly normal mood and affect, speech fluent and appropriate Neurologic: grossly non-focal.          Labs on Admission:  Basic Metabolic Panel: Recent Labs  Lab 08/02/20 1107 08/02/20 1305  NA 137  --   K 3.9  --   CL 95*  --   CO2 26  --   GLUCOSE 140*  --   BUN 21  --   CREATININE 1.59*  --   CALCIUM 9.0  --   MG  --  2.1   Liver Function Tests: No results for input(s): AST, ALT, ALKPHOS, BILITOT, PROT, ALBUMIN in the last 168 hours. No results for input(s): LIPASE, AMYLASE in the last 168 hours. No results for input(s):  AMMONIA in the last 168 hours. CBC: Recent Labs  Lab 08/02/20 1107  WBC 11.4*  HGB 14.3  HCT 41.8  MCV 93.7  PLT 212   Cardiac Enzymes: No results for input(s): CKTOTAL, CKMB, CKMBINDEX, TROPONINI in the last 168 hours.  BNP (last 3 results) Recent Labs    07/17/20 1417 07/20/20 1155 08/02/20 1107  BNP 39.4 40.6 503.4*    ProBNP (last 3 results) No results for input(s): PROBNP in the last 8760 hours.  CBG: No results for input(s): GLUCAP in the last 168 hours.  Radiological Exams on Admission: CT Angio Chest PE W and/or Wo Contrast  Result Date: 08/02/2020 CLINICAL DATA:  PE suspected, high prob Shortness of breath and wheezing.  CHF diagnosis 6 months ago. EXAM: CT ANGIOGRAPHY CHEST WITH CONTRAST TECHNIQUE: Multidetector CT imaging of the chest was performed using the standard protocol during bolus administration of intravenous contrast. Multiplanar CT image reconstructions and MIPs were obtained to evaluate the vascular anatomy. CONTRAST:  71mL OMNIPAQUE IOHEXOL 350 MG/ML SOLN COMPARISON:  Chest CT 05/24/2020. radiograph earlier this day FINDINGS: Cardiovascular: Examination is positive for pulmonary embolus with moderate saddle thrombus and filling defects extending into all lobar and many segmental and subsegmental branches. Thromboembolic burden is  large. There is straightening of the intraventricular septum with elevated RV to LV ratio of 2.04. There are coronary artery calcifications/stents. Aortic atherosclerosis without dissection. Conventional branching pattern from the aortic arch. No pericardial effusion. Mediastinum/Nodes: No mediastinal or hilar adenopathy. Heterogeneous thyroid gland just partially obscured on the left by dense contrast administration in the adjacent axillary vein. No esophageal wall thickening. Lungs/Pleura: No pulmonary infarct or focal airspace disease. No findings of pulmonary edema or pleural effusion. There is mild central bronchial thickening. 5 mm perifissural nodule in the right upper lobe is likely an intrapulmonary lymph node, unchanged from recent prior, series 6, image 45. Upper Abdomen: No acute or unexpected finding. Musculoskeletal: Remote right rib fractures with nonunion. There are no acute or suspicious osseous abnormalities. Review of the MIP images confirms the above findings. IMPRESSION: 1. Examination is positive for pulmonary embolus with moderate saddle thrombus and filling defects extending into all lobar and many segmental and subsegmental branches. Thromboembolic burden is large. There is CT findings of right heart strain (RV/LV Ratio = 2.04) consistent with at least submassive (intermediate risk) PE. The presence of right heart strain has been associated with an increased risk of morbidity and mortality. 2. Mild central bronchial thickening, can be seen with bronchitis or reactive airways disease. 3. Coronary artery calcifications/stents. 4. 5 mm perifissural nodule in the right upper lobe is likely an intrapulmonary lymph node but nonspecific. No follow-up needed if patient is low-risk. Non-contrast chest CT can be considered in 12 months if patient is high-risk. This recommendation follows the consensus statement: Guidelines for Management of Incidental Pulmonary Nodules Detected on CT Images: From the  Fleischner Society 2017; Radiology 2017; 284:228-243. Aortic Atherosclerosis (ICD10-I70.0). Critical Value/emergent results were called by telephone at the time of interpretation on 08/02/2020 at 4:58 pm to Dr Antoine Primas , who verbally acknowledged these results. Electronically Signed   By: Narda Rutherford M.D.   On: 08/02/2020 16:58   DG Chest Port 1 View  Result Date: 08/02/2020 CLINICAL DATA:  Shortness of breath. EXAM: PORTABLE CHEST 1 VIEW COMPARISON:  July 20, 2020. FINDINGS: Mild cardiomegaly is noted with mild central pulmonary vascular congestion. No pneumothorax or pleural effusion is noted. Both lungs are clear. The visualized skeletal structures are unremarkable.  IMPRESSION: Mild cardiomegaly with mild central pulmonary vascular congestion. Electronically Signed   By: Lupita Raider M.D.   On: 08/02/2020 12:22    EKG: Independently reviewed.  Sinus tachycardia, first-degree AV block, ventricular conduction delay, no ischemic changes, unchanged compared to prior.  Assessment/Plan Active Problems:   Hypertension   CAD in native artery   Chronic diastolic CHF (congestive heart failure) (HCC)   Tobacco abuse   COPD with acute exacerbation (HCC)   Acute on chronic heart failure (HCC)   Acute saddle pulmonary embolism (HCC)  #Pulmonary embolism #Acute hypoxemic respiratory failure Patient presented with rapidly escalating oxygen requirements, initially thought to have combination CHF and COPD exacerbation, however on CTPA patient found to have saddle pulmonary embolism with large clot burden and evidence of right heart strain.  Patient also has elevated troponins.  Consulted vascular surgery, they will plan on pulmonary thrombectomy and lysis in the a.m. -Admit to stepdown, low threshold to transfer to ICU -Heparin GTT per pharmacy protocol -TTE  #COPD exacerbation Patient with reduced breath sounds and does have signs of bronchitis on CT in addition to PE.  -Scheduled  nebs -Prednisone 40 p.o. x5 days -Azithromycin 500 p.o. x3 days  #Chronic CHF Unclear to what degree CHF is playing a role in patient's presentation, he does have elevated BNP and troponins as well as lower extremity edema however suspect this is all secondary to his large pulmonary embolism.  Will hold further diuresis at this time pending treatment for his pulmonary embolism.  #Chronic medical problems CAD: Hold aspirin and clopidogrel in setting of therapeutic anticoagulation Hypertension: Continue lisinopril and metoprolol as long as pressures hold Hyperlipidemia continue Crestor   Code Status: Full Code presumed DVT Prophylaxis: Lovenox Family Communication: patient declined Disposition Plan: Inpatient, step down unit  Time spent: 26 min  Venora Maples MD/MPH [facility]

## 2020-08-03 ENCOUNTER — Encounter
Admission: EM | Disposition: A | Discharge status: Home Health | Payer: Self-pay | Source: Home / Self Care | Attending: Internal Medicine

## 2020-08-03 ENCOUNTER — Inpatient Hospital Stay: Payer: Medicare Other

## 2020-08-03 ENCOUNTER — Other Ambulatory Visit: Payer: Self-pay

## 2020-08-03 ENCOUNTER — Encounter: Payer: Self-pay | Admitting: Family Medicine

## 2020-08-03 ENCOUNTER — Inpatient Hospital Stay: Admit: 2020-08-03 | Payer: Medicare Other

## 2020-08-03 ENCOUNTER — Other Ambulatory Visit (INDEPENDENT_AMBULATORY_CARE_PROVIDER_SITE_OTHER): Payer: Self-pay | Admitting: Vascular Surgery

## 2020-08-03 ENCOUNTER — Inpatient Hospital Stay (HOSPITAL_COMMUNITY)
Admit: 2020-08-03 | Discharge: 2020-08-03 | Disposition: A | Discharge status: Home / Self Care | Payer: Medicare Other | Attending: Family Medicine | Admitting: Family Medicine

## 2020-08-03 DIAGNOSIS — I2692 Saddle embolus of pulmonary artery without acute cor pulmonale: Secondary | ICD-10-CM

## 2020-08-03 DIAGNOSIS — J441 Chronic obstructive pulmonary disease with (acute) exacerbation: Secondary | ICD-10-CM

## 2020-08-03 DIAGNOSIS — I10 Essential (primary) hypertension: Secondary | ICD-10-CM

## 2020-08-03 DIAGNOSIS — I2699 Other pulmonary embolism without acute cor pulmonale: Secondary | ICD-10-CM

## 2020-08-03 DIAGNOSIS — I2602 Saddle embolus of pulmonary artery with acute cor pulmonale: Secondary | ICD-10-CM

## 2020-08-03 HISTORY — PX: PULMONARY THROMBECTOMY: CATH118295

## 2020-08-03 LAB — MAGNESIUM
Magnesium: 2.3 mg/dL (ref 1.7–2.4)
Magnesium: 2.3 mg/dL (ref 1.7–2.4)

## 2020-08-03 LAB — CBC
HCT: 42.6 % (ref 39.0–52.0)
HCT: 38.3 % — ABNORMAL LOW (ref 39.0–52.0)
Hemoglobin: 14.3 g/dL (ref 13.0–17.0)
Hemoglobin: 13.4 g/dL (ref 13.0–17.0)
MCH: 32.4 pg (ref 26.0–34.0)
MCH: 32.1 pg (ref 26.0–34.0)
MCHC: 33.6 g/dL (ref 30.0–36.0)
MCHC: 35 g/dL (ref 30.0–36.0)
MCV: 96.4 fL (ref 80.0–100.0)
MCV: 91.6 fL (ref 80.0–100.0)
Platelets: 209 10*3/uL (ref 150–400)
Platelets: 220 10*3/uL (ref 150–400)
RBC: 4.42 MIL/uL (ref 4.22–5.81)
RBC: 4.18 MIL/uL — ABNORMAL LOW (ref 4.22–5.81)
RDW: 13.3 % (ref 11.5–15.5)
RDW: 13.5 % (ref 11.5–15.5)
WBC: 11.5 10*3/uL — ABNORMAL HIGH (ref 4.0–10.5)
WBC: 12.5 10*3/uL — ABNORMAL HIGH (ref 4.0–10.5)
nRBC: 0 % (ref 0.0–0.2)
nRBC: 0 % (ref 0.0–0.2)

## 2020-08-03 LAB — BASIC METABOLIC PANEL
Anion gap: 16 — ABNORMAL HIGH (ref 5–15)
Anion gap: 11 (ref 5–15)
BUN: 22 mg/dL (ref 8–23)
BUN: 26 mg/dL — ABNORMAL HIGH (ref 8–23)
CO2: 25 mmol/L (ref 22–32)
CO2: 27 mmol/L (ref 22–32)
Calcium: 9.1 mg/dL (ref 8.9–10.3)
Calcium: 8.3 mg/dL — ABNORMAL LOW (ref 8.9–10.3)
Chloride: 95 mmol/L — ABNORMAL LOW (ref 98–111)
Chloride: 95 mmol/L — ABNORMAL LOW (ref 98–111)
Creatinine, Ser: 1.53 mg/dL — ABNORMAL HIGH (ref 0.61–1.24)
Creatinine, Ser: 1.46 mg/dL — ABNORMAL HIGH (ref 0.61–1.24)
GFR calc Af Amer: 54 mL/min — ABNORMAL LOW (ref >60)
GFR calc Af Amer: 57 mL/min — ABNORMAL LOW (ref >60)
GFR calc non Af Amer: 46 mL/min — ABNORMAL LOW (ref >60)
GFR calc non Af Amer: 49 mL/min — ABNORMAL LOW (ref >60)
Glucose, Bld: 128 mg/dL — ABNORMAL HIGH (ref 70–99)
Glucose, Bld: 161 mg/dL — ABNORMAL HIGH (ref 70–99)
Potassium: 4.6 mmol/L (ref 3.5–5.1)
Potassium: 5 mmol/L (ref 3.5–5.1)
Sodium: 136 mmol/L (ref 135–145)
Sodium: 133 mmol/L — ABNORMAL LOW (ref 135–145)

## 2020-08-03 LAB — MRSA PCR SCREENING: MRSA by PCR: NEGATIVE

## 2020-08-03 LAB — HEPARIN LEVEL (UNFRACTIONATED)
Heparin Unfractionated: 0.62 IU/mL (ref 0.30–0.70)
Heparin Unfractionated: 0.21 IU/mL — ABNORMAL LOW (ref 0.30–0.70)

## 2020-08-03 LAB — ECHOCARDIOGRAM COMPLETE
Height: 73 in
S' Lateral: 2.39 cm
Weight: 6536.2 oz

## 2020-08-03 LAB — GLUCOSE, CAPILLARY
Glucose-Capillary: 110 mg/dL — ABNORMAL HIGH (ref 70–99)
Glucose-Capillary: 139 mg/dL — ABNORMAL HIGH (ref 70–99)
Glucose-Capillary: 161 mg/dL — ABNORMAL HIGH (ref 70–99)

## 2020-08-03 LAB — TROPONIN I (HIGH SENSITIVITY)
Troponin I (High Sensitivity): 24 ng/L — ABNORMAL HIGH (ref <18)
Troponin I (High Sensitivity): 20 ng/L — ABNORMAL HIGH (ref <18)

## 2020-08-03 LAB — BRAIN NATRIURETIC PEPTIDE: B Natriuretic Peptide: 218.8 pg/mL — ABNORMAL HIGH (ref 0.0–100.0)

## 2020-08-03 SURGERY — PULMONARY THROMBECTOMY
Anesthesia: Moderate Sedation

## 2020-08-03 MED ORDER — IODIXANOL 320 MG/ML IV SOLN
INTRAVENOUS | Status: DC | PRN
  Administered 2020-08-03: 60 mL via INTRAVENOUS

## 2020-08-03 MED ORDER — FAMOTIDINE 20 MG PO TABS: 40.0000 mg | ORAL_TABLET | Freq: Once | ORAL | Status: DC | PRN

## 2020-08-03 MED ORDER — ALTEPLASE 2 MG IJ SOLR
INTRAMUSCULAR | Status: DC | PRN
  Administered 2020-08-03: 8 mg

## 2020-08-03 MED ORDER — CEFTRIAXONE SODIUM 1 G IJ SOLR: 1.0000 g | INTRAMUSCULAR | Status: DC

## 2020-08-03 MED ORDER — FENTANYL CITRATE (PF) 100 MCG/2ML IJ SOLN
INTRAMUSCULAR | Status: DC | PRN
Start: 2020-08-03 — End: 2020-08-03
  Administered 2020-08-03: 50 ug via INTRAVENOUS

## 2020-08-03 MED ORDER — MIDAZOLAM HCL 2 MG/ML PO SYRP: 8.0000 mg | ORAL_SOLUTION | Freq: Once | ORAL | Status: DC | PRN

## 2020-08-03 MED ORDER — METHYLPREDNISOLONE SODIUM SUCC 125 MG IJ SOLR: 125.0000 mg | Freq: Once | INTRAMUSCULAR | Status: DC | PRN

## 2020-08-03 MED ORDER — HEPARIN (PORCINE) 25000 UT/250ML-% IV SOLN
2150.0000 [IU]/h | INTRAVENOUS | Status: DC
  Administered 2020-08-03: 2150 [IU]/h via INTRAVENOUS
  Filled 2020-08-03 (×2): qty 250

## 2020-08-03 MED ORDER — METHYLPREDNISOLONE SODIUM SUCC 125 MG IJ SOLR
40.0000 mg | Freq: Two times a day (BID) | INTRAMUSCULAR | Status: DC
  Administered 2020-08-03 – 2020-08-07 (×8): 40 mg via INTRAVENOUS
  Filled 2020-08-03 (×8): qty 2

## 2020-08-03 MED ORDER — SODIUM CHLORIDE 0.9 % IV SOLN
1.0000 g | INTRAVENOUS | Status: DC
  Administered 2020-08-04 – 2020-08-07 (×4): 1 g via INTRAVENOUS
  Filled 2020-08-03: qty 1
  Filled 2020-08-03: qty 10
  Filled 2020-08-03 (×3): qty 1

## 2020-08-03 MED ORDER — ALTEPLASE 2 MG IJ SOLR
INTRAMUSCULAR | Status: AC
  Filled 2020-08-03: qty 8

## 2020-08-03 MED ORDER — METOPROLOL TARTRATE 5 MG/5ML IV SOLN
5.0000 mg | Freq: Once | INTRAVENOUS | Status: AC
  Administered 2020-08-03: 5 mg via INTRAVENOUS
  Filled 2020-08-03: qty 5

## 2020-08-03 MED ORDER — HYDROMORPHONE HCL 1 MG/ML IJ SOLN: 1.0000 mg | Freq: Once | INTRAMUSCULAR | Status: DC | PRN

## 2020-08-03 MED ORDER — HEPARIN SODIUM (PORCINE) 1000 UNIT/ML IJ SOLN
INTRAMUSCULAR | Status: AC
  Filled 2020-08-03: qty 1

## 2020-08-03 MED ORDER — HEPARIN SODIUM (PORCINE) 1000 UNIT/ML IJ SOLN
INTRAMUSCULAR | Status: DC | PRN
  Administered 2020-08-03: 3000 [IU] via INTRAVENOUS

## 2020-08-03 MED ORDER — CEFAZOLIN SODIUM-DEXTROSE 2-4 GM/100ML-% IV SOLN
2.0000 g | Freq: Once | INTRAVENOUS | Status: AC
  Administered 2020-08-03: 2 g via INTRAVENOUS

## 2020-08-03 MED ORDER — MIDAZOLAM HCL 5 MG/5ML IJ SOLN
INTRAMUSCULAR | Status: AC
  Filled 2020-08-03: qty 5

## 2020-08-03 MED ORDER — FENTANYL CITRATE (PF) 100 MCG/2ML IJ SOLN
INTRAMUSCULAR | Status: AC
Start: 2020-08-03 — End: 2020-08-03
  Filled 2020-08-03: qty 2

## 2020-08-03 MED ORDER — IPRATROPIUM-ALBUTEROL 0.5-2.5 (3) MG/3ML IN SOLN
RESPIRATORY_TRACT | Status: AC
  Administered 2020-08-03: 3 mL via RESPIRATORY_TRACT
  Filled 2020-08-03: qty 3

## 2020-08-03 MED ORDER — MIDAZOLAM HCL 2 MG/2ML IJ SOLN
INTRAMUSCULAR | Status: DC | PRN
  Administered 2020-08-03: 1 mg via INTRAVENOUS

## 2020-08-03 MED ORDER — CHLORHEXIDINE GLUCONATE CLOTH 2 % EX PADS
6.0000 | MEDICATED_PAD | Freq: Every day | CUTANEOUS | Status: DC
  Administered 2020-08-03: 6 via TOPICAL

## 2020-08-03 MED ORDER — CEFAZOLIN SODIUM-DEXTROSE 1-4 GM/50ML-% IV SOLN: 1.0000 g | Freq: Once | INTRAVENOUS | Status: DC

## 2020-08-03 MED ORDER — ONDANSETRON HCL 4 MG/2ML IJ SOLN: 4.0000 mg | Freq: Four times a day (QID) | INTRAMUSCULAR | Status: DC | PRN

## 2020-08-03 MED ORDER — SODIUM CHLORIDE 0.9 % IV SOLN: INTRAVENOUS | Status: DC

## 2020-08-03 MED ORDER — DIPHENHYDRAMINE HCL 50 MG/ML IJ SOLN: 50.0000 mg | Freq: Once | INTRAMUSCULAR | Status: DC | PRN

## 2020-08-03 MED ORDER — PNEUMOCOCCAL VAC POLYVALENT 25 MCG/0.5ML IJ INJ
0.5000 mL | INJECTION | INTRAMUSCULAR | Status: AC
  Administered 2020-08-04: 0.5 mL via INTRAMUSCULAR
  Filled 2020-08-03: qty 0.5

## 2020-08-03 SURGICAL SUPPLY — 13 items
CANISTER PENUMBRA ENGINE (MISCELLANEOUS) ×2 IMPLANT
CANNULA 5F STIFF (CANNULA) ×2 IMPLANT
CATH ANGIO 5F 100CM .035 PIG (CATHETERS) ×2 IMPLANT
CATH INDIGO 12 HTORQ 115 (CATHETERS) ×2 IMPLANT
CATH INDIGO SEP 12 (CATHETERS) ×2 IMPLANT
CATH INFINITI JR4 5F (CATHETERS) ×2 IMPLANT
GLIDEWIRE ADV .035X260CM (WIRE) ×2 IMPLANT
PACK ANGIOGRAPHY (CUSTOM PROCEDURE TRAY) ×2 IMPLANT
SHEATH BRITE TIP 8FRX11 (SHEATH) ×2 IMPLANT
SHEATH PINNACLE 11FRX10 (SHEATH) ×4 IMPLANT
TUBING CONTRAST HIGH PRESS 72 (TUBING) ×2 IMPLANT
WIRE J 3MM .035X145CM (WIRE) ×2 IMPLANT
WIRE MAGIC TORQUE 260C (WIRE) ×4 IMPLANT

## 2020-08-03 NOTE — Progress Notes (Signed)
ECHO completed at bedside by J. Hege. Pt. Tolerated well. Duoneb resp. tx started secondary to pt. Diminished lungs throughout. Dyspnea with min. Exertion. Await Dr. Wyn Quaker to speak with pt. Re: thrombectomy procedure today.

## 2020-08-03 NOTE — Progress Notes (Signed)
Patient's cardiac monitor was alarming. Rn witnessed lots of ectopy from ST-elevation, R on T, dropped beats, SVT 120-170s. EKG completed and placed in chart. NP B. Jon Billings notified. Labs ordered and prn x1 metoprolol.

## 2020-08-03 NOTE — Progress Notes (Signed)
PROGRESS NOTE    Thomas LoftyRichard Douglas Tanner  ZOX:[id number] DOB: 04/04/1951 DOA: 08/02/2020 PCP: Jenne PaneLlc, [facility]   Assessment & Plan:   Active Problems:   Hypertension   CAD in native artery   Chronic diastolic CHF (congestive heart failure) (HCC)   Tobacco abuse   COPD with acute exacerbation (HCC)   Acute on chronic heart failure (HCC)   Acute saddle pulmonary embolism (HCC)    Acute on chronic hypoxic respiratory failure: secondary to large saddle pulmonary embolism w/ large clot burden. S/p thrombectomy today as per vascular surg. On 3L [place] 24-7 as per pt  Pulmonary embolism: w/ large saddle emboli w/ large clot burden. S/p thrombectomy today as per vascular surg. Continue on IV heparin. US of b/l LE ordered   COPD exacerbation: continue on IV solumedrol, azithromycin, & bronchodilators. Encourage incentive spirometry   Leukocytosis: possibly secondary to steroid use. Will continue to monitor   Chronic CHF: will hold lasix until pt receives treatment for pulmonary emboli. Unknown systolic vs diastolic vs mixed. Echo ordered  CAD: hold aspirin and clopidogrel in setting of therapeutic anticoagulation  Hypertension: continue lisinopril and metoprolol, keep MAP >65  Hyperlipidemia: continue on statin   Morbid obesity: BMI 53.9. Would benefit from significant weight loss.   DVT prophylaxis: heparin  Code Status: full  Family Communication:  Disposition Plan: depends on PT/OT recs   Consultants:   Vascular surg    Procedures:   Contrast injection right heart             2.  Thrombolysis with 4 mg of TPA in the left pulmonary arteries and 4 mg of TPA on the right pulmonary arteries             3.  Mechanical thrombectomy using the penumbra CAT 12 device to the left lower lobe and left upper lobe pulmonary arteries well as mechanical thrombectomy using the penumbra CAT 12 device to the right lower lobe, right middle lobe, and right upper lobe pulmonary  arteries             4.  Selective catheter placement right lower lobe, right middle lobe, and right upper lobe pulmonary arteries             5.  Selective catheter placement left lower lobe and left upper lobe pulmonary artery                Antimicrobials:    Subjective: Pt c/o shortness of breath   Objective: Vitals:   08/03/20 0430 08/03/20 0600 08/03/20 0630 08/03/20 0700  BP: (!) 117/45 (!) 152/72 (!) 145/110 (!) 99/53  Pulse: 95 91 96   Resp: (!) 21 19 18 18   Temp:      TempSrc:      SpO2: 94% 93% 95%   Weight:      Height:        Intake/Output Summary (Last 24 hours) at 08/03/2020 0844 Last data filed at 08/03/2020 0127 Gross per 24 hour  Intake --  Output 1750 ml  Net -1750 ml   Filed Weights   08/02/20 1230  Weight: (!) 185.3 kg    Examination:  General exam: Appears calm and comfortable  Respiratory system: diminished breath sounds b/l Cardiovascular system: S1 & S2 +. No rubs, gallops or clicks. B/l LE edema Gastrointestinal system: Abdomen is obese, soft and nontender. Hypoactive bowel sounds heard. Central nervous system: Alert and oriented. Moves all 4 extremities Psychiatry: Judgement and insight appear normal. Agitated.  Data Reviewed: I have personally reviewed following labs and imaging studies  CBC: Recent Labs  Lab 08/02/20 1107 08/03/20 0634  WBC 11.4* 11.5*  HGB 14.3 14.3  HCT 41.8 42.6  MCV 93.7 96.4  PLT 212 209   Basic Metabolic Panel: Recent Labs  Lab 08/02/20 1107 08/02/20 1305 08/03/20 0634  NA 137  --  136  K 3.9  --  4.6  CL 95*  --  95*  CO2 26  --  25  GLUCOSE 140*  --  128*  BUN 21  --  22  CREATININE 1.59*  --  1.53*  CALCIUM 9.0  --  9.1  MG  --  2.1 2.3   GFR: Estimated Creatinine Clearance: 80.9 mL/min (A) (by C-G formula based on SCr of 1.53 mg/dL (H)). Liver Function Tests: No results for input(s): AST, ALT, ALKPHOS, BILITOT, PROT, ALBUMIN in the last 168 hours. No results for input(s): LIPASE,  AMYLASE in the last 168 hours. No results for input(s): AMMONIA in the last 168 hours. Coagulation Profile: Recent Labs  Lab 08/02/20 1659  INR 1.1   Cardiac Enzymes: No results for input(s): CKTOTAL, CKMB, CKMBINDEX, TROPONINI in the last 168 hours. BNP (last 3 results) No results for input(s): PROBNP in the last 8760 hours. HbA1C: No results for input(s): HGBA1C in the last 72 hours. CBG: No results for input(s): GLUCAP in the last 168 hours. Lipid Profile: No results for input(s): CHOL, HDL, LDLCALC, TRIG, CHOLHDL, LDLDIRECT in the last 72 hours. Thyroid Function Tests: No results for input(s): TSH, T4TOTAL, FREET4, T3FREE, THYROIDAB in the last 72 hours. Anemia Panel: No results for input(s): VITAMINB12, FOLATE, FERRITIN, TIBC, IRON, RETICCTPCT in the last 72 hours. Sepsis Labs: No results for input(s): PROCALCITON, LATICACIDVEN in the last 168 hours.  Recent Results (from the past 240 hour(s))  SARS Coronavirus 2 by RT PCR (hospital order, performed in [facility] hospital lab) Nasopharyngeal Nasopharyngeal Swab     Status: None   Collection Time: 08/02/20 12:12 PM   Specimen: Nasopharyngeal Swab  Result Value Ref Range Status   SARS Coronavirus 2 NEGATIVE NEGATIVE Final    Comment: (NOTE) SARS-CoV-2 target nucleic acids are NOT DETECTED.  The SARS-CoV-2 RNA is generally detectable in upper and lower respiratory specimens during the acute phase of infection. The lowest concentration of SARS-CoV-2 viral copies this assay can detect is 250 copies / mL. A negative result does not preclude SARS-CoV-2 infection and should not be used as the sole basis for treatment or other patient management decisions.  A negative result may occur with improper specimen collection / handling, submission of specimen other than nasopharyngeal swab, presence of viral mutation(s) within the areas targeted by this assay, and inadequate number of viral copies (<250 copies / mL). A negative  result must be combined with clinical observations, patient history, and epidemiological information.  Fact Sheet for Patients:   BoilerBrush.com.cy  Fact Sheet for Healthcare Providers: https://pope.com/  This test is not yet approved or  cleared by the [place] FDA and has been authorized for detection and/or diagnosis of SARS-CoV-2 by FDA under an Emergency Use Authorization (EUA).  This EUA will remain in effect (meaning this test can be used) for the duration of the COVID-19 declaration under Section 564(b)(1) of the Act, 21 U.S.C. section 360bbb-3(b)(1), unless the authorization is terminated or revoked sooner.  Performed at [facility], [address], [place], [place] [address]          Radiology Studies:  CT Angio Chest PE W and/or Wo Contrast  Result Date: 08/02/2020 CLINICAL DATA:  PE suspected, high prob Shortness of breath and wheezing.  CHF diagnosis 6 months ago. EXAM: CT ANGIOGRAPHY CHEST WITH CONTRAST TECHNIQUE: Multidetector CT imaging of the chest was performed using the standard protocol during bolus administration of intravenous contrast. Multiplanar CT image reconstructions and MIPs were obtained to evaluate the vascular anatomy. CONTRAST:  58mL OMNIPAQUE IOHEXOL 350 MG/ML SOLN COMPARISON:  Chest CT 05/24/2020. radiograph earlier this day FINDINGS: Cardiovascular: Examination is positive for pulmonary embolus with moderate saddle thrombus and filling defects extending into all lobar and many segmental and subsegmental branches. Thromboembolic burden is large. There is straightening of the intraventricular septum with elevated RV to LV ratio of 2.04. There are coronary artery calcifications/stents. Aortic atherosclerosis without dissection. Conventional branching pattern from the aortic arch. No pericardial effusion. Mediastinum/Nodes: No mediastinal or hilar adenopathy. Heterogeneous thyroid gland just  partially obscured on the left by dense contrast administration in the adjacent axillary vein. No esophageal wall thickening. Lungs/Pleura: No pulmonary infarct or focal airspace disease. No findings of pulmonary edema or pleural effusion. There is mild central bronchial thickening. 5 mm perifissural nodule in the right upper lobe is likely an intrapulmonary lymph node, unchanged from recent prior, series 6, image 45. Upper Abdomen: No acute or unexpected finding. Musculoskeletal: Remote right rib fractures with nonunion. There are no acute or suspicious osseous abnormalities. Review of the MIP images confirms the above findings. IMPRESSION: 1. Examination is positive for pulmonary embolus with moderate saddle thrombus and filling defects extending into all lobar and many segmental and subsegmental branches. Thromboembolic burden is large. There is CT findings of right heart strain (RV/LV Ratio = 2.04) consistent with at least submassive (intermediate risk) PE. The presence of right heart strain has been associated with an increased risk of morbidity and mortality. 2. Mild central bronchial thickening, can be seen with bronchitis or reactive airways disease. 3. Coronary artery calcifications/stents. 4. 5 mm perifissural nodule in the right upper lobe is likely an intrapulmonary lymph node but nonspecific. No follow-up needed if patient is low-risk. Non-contrast chest CT can be considered in 12 months if patient is high-risk. This recommendation follows the consensus statement: Guidelines for Management of Incidental Pulmonary Nodules Detected on CT Images: From the Fleischner Society 2017; Radiology 2017; 284:228-243. Aortic Atherosclerosis (ICD10-I70.0). Critical Value/emergent results were called by telephone at the time of interpretation on 08/02/2020 at 4:58 pm to Dr Antoine Primas , who verbally acknowledged these results. Electronically Signed   By: Narda Rutherford M.D.   On: 08/02/2020 16:58   DG Chest Port  1 View  Result Date: 08/02/2020 CLINICAL DATA:  Shortness of breath. EXAM: PORTABLE CHEST 1 VIEW COMPARISON:  July 20, 2020. FINDINGS: Mild cardiomegaly is noted with mild central pulmonary vascular congestion. No pneumothorax or pleural effusion is noted. Both lungs are clear. The visualized skeletal structures are unremarkable. IMPRESSION: Mild cardiomegaly with mild central pulmonary vascular congestion. Electronically Signed   By: Lupita Raider M.D.   On: 08/02/2020 12:22        Scheduled Meds: . azithromycin  500 mg Oral Daily  . famotidine  20 mg Oral Daily  . ipratropium-albuterol  3 mL Nebulization Q6H  . lisinopril  10 mg Oral Daily  . metoprolol succinate  50 mg Oral Daily  . predniSONE  40 mg Oral Q breakfast  . rosuvastatin  20 mg Oral Daily  . sodium chloride flush  3 mL Intravenous Q12H  Continuous Infusions: . sodium chloride    . heparin 2,000 Units/hr (08/02/20 1736)     LOS: 1 day    Time spent: 33 mins     Charise Killian, MD [facility] Pager [phone]  If 7PM-7AM, please contact night-coverage [email or web address] 08/03/2020, 8:44 AM

## 2020-08-03 NOTE — Interval H&P Note (Signed)
History and Physical Interval Note:  08/03/2020 10:43 AM  Thomas Tanner  has presented today for surgery, with the diagnosis of PE.  The various methods of treatment have been discussed with the patient and family. After consideration of risks, benefits and other options for treatment, the patient has consented to  Procedure(s): PULMONARY THROMBECTOMY / THROMBOLYSIS (N/A) as a surgical intervention.  The patient's history has been reviewed, patient examined, no change in status, stable for surgery.  I have reviewed the patient's chart and labs.  Questions were answered to the patient's satisfaction.     Festus Barren

## 2020-08-03 NOTE — Consult Note (Signed)
ANTICOAGULATION CONSULT NOTE   Pharmacy Consult for Heparin Indication: pulmonary embolus  Allergies  Allergen Reactions  . Morphine Anxiety and Other (See Comments)    Hallucinations  . Morphine And Related Anxiety    Patient Measurements: Height: 6\' 1"  (185.4 cm) Weight: (!) 185.3 kg (408 lb 8.2 oz) IBW/kg (Calculated) : 79.9 Heparin Dosing Weight: 127.1 kg  Vital Signs: Temp: 98.2 F (36.8 C) (08/24 1930) Temp Source: Oral (08/24 1930) BP: 120/90 (08/24 2200) Pulse Rate: 93 (08/24 2200)  Labs: Recent Labs    08/02/20 1107 08/02/20 1305 08/02/20 1659 08/03/20 0055  HGB 14.3  --   --   --   HCT 41.8  --   --   --   PLT 212  --   --   --   APTT  --   --  31  --   LABPROT  --   --  13.9  --   INR  --   --  1.1  --   HEPARINUNFRC  --   --   --  0.62  CREATININE 1.59*  --   --   --   TROPONINIHS 43* 35*  --   --     Estimated Creatinine Clearance: 77.9 mL/min (A) (by C-G formula based on SCr of 1.59 mg/dL (H)).   Medical History: Past Medical History:  Diagnosis Date  . Arthritis   . COPD (chronic obstructive pulmonary disease) (HCC)   . Diabetes (HCC)   . Hyperlipidemia   . Hypertension   . Kidney stone   . Obesity   . Tachycardia   . TIA (transient ischemic attack)     Medications:  (Not in a hospital admission)  Scheduled:  Infusions:  PRN:  Anti-infectives (From admission, onward)   Start     Dose/Rate Route Frequency Ordered Stop   08/03/20 1715  azithromycin (ZITHROMAX) tablet 500 mg  Status:  Discontinued       "Followed by" Linked Group Details   500 mg Oral Daily 08/02/20 1705 08/02/20 1727   08/02/20 1800  azithromycin (ZITHROMAX) tablet 500 mg        500 mg Oral Daily 08/02/20 1737 08/05/20 1759   08/02/20 1705  azithromycin (ZITHROMAX) 500 mg in sodium chloride 0.9 % 250 mL IVPB  Status:  Discontinued       "Followed by" Linked Group Details   500 mg 250 mL/hr over 60 Minutes Intravenous Every 24 hours 08/02/20 1705 08/02/20 1727       Assessment: Pharmacy was consulted to start heparin for a PE. No DOAC PTA noted. CBC stable.    Goal of Therapy:  Heparin level 0.3-0.7 units/ml Monitor platelets by anticoagulation protocol: Yes   Plan:  08/25 @ 0055 HL 0.62 therapeutic. Will continue current rate and will recheck HL at 0900 and continue to monitor.  9/25, PharmD, BCPS 08/03/2020,1:14 AM

## 2020-08-03 NOTE — Progress Notes (Signed)
Cross Cover Brief Note RN reports patient, post pulmonary thrombectomy today, with runs of non sustained sinus tachycardia and prolonged PR. EKG  PR 0.25, borderline prolonged QTc  444 and ST elevation inferior leads. No reciprocal changes appreciated. Patient on daily metoprolol of which he confirms he did have this am. He is asymptomatic of arrhythmia and BP is stable. CBC H & H stable, mild increase in WBC to 12.5 from 11.5, expected given procedure BMP k 5.0, creatinine improving Mag 2.3 CXR - no signoficant change from day prior  Troponin continues to trend down BNP unimpressive Azithromycin discontinued,, replaced with rocephin Additional 5 mg IV metoprolol given

## 2020-08-03 NOTE — Op Note (Signed)
[facility]  Percutaneous Study/Intervention Procedural Note   Date of Surgery: 08/03/2020,11:45 AM  Surgeon: Festus Barren  Pre-operative Diagnosis: Symptomatic bilateral pulmonary emboli  Post-operative diagnosis:  Same  Procedure(s) Performed:  1.  Contrast injection right heart  2.  Thrombolysis with 4 mg of TPA in the left pulmonary arteries and 4 mg of TPA on the right pulmonary arteries  3.  Mechanical thrombectomy using the penumbra CAT 12 device to the left lower lobe and left upper lobe pulmonary arteries well as mechanical thrombectomy using the penumbra CAT 12 device to the right lower lobe, right middle lobe, and right upper lobe pulmonary arteries  4.  Selective catheter placement right lower lobe, right middle lobe, and right upper lobe pulmonary arteries  5.  Selective catheter placement left lower lobe and left upper lobe pulmonary artery    Anesthesia: Conscious sedation was administered under my direct supervision by the interventional radiology RN. IV Versed plus fentanyl were utilized. Continuous ECG, pulse oximetry and blood pressure was monitored throughout the entire procedure.  Versed and fentanyl were administered intravenously.  Conscious sedation was administered for a total of 25 minutes using 1 of Versed and 50 mcg of Fentanyl.  EBL: 400 cc  Sheath: 11 French left femoral vein  Contrast: 60 cc   Fluoroscopy Time: 10 minutes  Indications:  Patient presents with pulmonary emboli. The patient is symptomatic with hypoxemia and dyspnea on exertion.  There is evidence of right heart strain on the CT angiogram. The patient is otherwise a good candidate for intervention and even the long-term benefits pulmonary angiography with thrombolysis is offered. The risks and benefits are reviewed long-term benefits are discussed. All questions are answered patient agrees to proceed.  Procedure:  Thomas Coye Lassiteris a 69 y.o. male who was  identified and appropriate procedural time out was performed.  The patient was then placed supine on the table and prepped and draped in the usual sterile fashion.  Ultrasound was used to evaluate the left common femoral vein.  It was patent, as it was echolucent and compressible.  A digital ultrasound image was acquired for the permanent record.  A Seldinger needle was used to access the left common femoral vein under direct ultrasound guidance.  A 0.035 J wire was advanced without resistance and a 5Fr sheath was placed and then upsized to an 8 [place] sheath.  Then later at 28 French sheath was placed for treatment.  The wire and pigtail catheter were then negotiated into the right atrium and bolus injection of contrast was utilized to demonstrate the right ventricle and the pulmonary artery outflow. The wire and catheter were then negotiated into the main pulmonary artery where hand injection of contrast was utilized to demonstrate the pulmonary arteries and confirm the locations of the pulmonary emboli.  Image quality was extremely poor.  Selective imaging of the left upper left lower lobes demonstrated extensive thrombus and 4 mg of TPA was given in the left main pulmonary artery.  I then flipped the JR4 catheter to the right side and again extremely poor image quality was seen.  Selective imaging showed thrombus in the right lower lobe and with the catheter was directed into the middle and upper lobe significant thrombus was seen again as well as back into the right main pulmonary artery.  An additional 4 mg of TPA was then given in the right main pulmonary artery  3000 units of heparin was then given and allowed to circulate.  The Penumbra Cat 12 catheter was then advanced up into the pulmonary vasculature. The left lung was addressed first. Catheter was negotiated into the left lower lobe and mechanical thrombectomy was performed. Follow-up imaging demonstrated a good result and therefore the catheter  was renegotiated into the left upper lobe pulmonary artery and again mechanical thrombectomy was performed. Passes were made with both the Penumbra catheter itself as well as introducing the separator both the left lower lobe and the left upper lobe. Follow-up imaging was then performed.  Small amount of residual thrombus was seen in the left upper lobe and a small to medium amount of thrombus was seen in the left lower lobe.  The Penumbra Cat 12 catheter was then negotiated to the opposite side. The right lung was then addressed. Catheter was negotiated into the right lower lobe pulmonary artery and mechanical thrombectomy was performed. Follow-up imaging demonstrated a good result and therefore the catheter was renegotiated into the right middle lobe pulmonary artery and again mechanical thrombectomy was performed.  After several passes of the right middle lobe, I redirected the penumbra CAT 12 catheter up into the right upper lobe pulmonary artery.  Again, the penumbra CAT 12 catheter was used to perform mechanical thrombectomy in the right upper lobe.  Passes were made with both the Penumbra catheter itself as well as introducing the separator in the right lower lobe, right middle lobe, and right upper lobe. Follow-up imaging was then performed.  Marked improvement in the thrombus burden was seen on the right side as well although again the image quality was quite poor.  After review these images wires were reintroduced and the catheters removed. Then, the sheath is then pulled and pressures held. A safeguard is placed.    Findings:   Right heart imaging:  Right atrium and right ventricle and the pulmonary outflow tract appears quite dilated  Right lung:  Extensive thrombus in the right pulmonary artery and extending into all three primary branches.  Image quality was extremely poor due to his size and respiratory artifact.  Left lung: Extensive thrombus in the left main pulmonary artery extending  into the left lower lobe and left upper lobe branches.  Image quality was extremely poor.    Disposition: Patient was taken to the recovery room in stable condition having tolerated the procedure well.  Sherena Machorro 08/03/2020,11:45 AM

## 2020-08-03 NOTE — Progress Notes (Signed)
PAD off from 15:15 -15:30 pm for [place] left leg. Pt. Tolerated well. PAD replaced with 40 ml air . No active hematoma, edema, drainage, ecchymosis at [facility] site.

## 2020-08-03 NOTE — Progress Notes (Signed)
*  PRELIMINARY RESULTS* Echocardiogram 2D Echocardiogram has been performed.  Cristela Blue 08/03/2020, 10:33 AM

## 2020-08-03 NOTE — Progress Notes (Signed)
Ultrasound tech. At bedside performing bilat. LE [place]. Dr. [name], hospitalist in at bedside to speak with pt. Pt. Tolerating [place] well.

## 2020-08-03 NOTE — Consult Note (Signed)
ANTICOAGULATION CONSULT NOTE   Pharmacy Consult for Heparin Indication: pulmonary embolus  Allergies  Allergen Reactions  . Morphine Anxiety and Other (See Comments)    Hallucinations  . Morphine And Related Anxiety    Patient Measurements: Height: 6\' 1"  (185.4 cm) Weight: (!) 185.3 kg (408 lb 8.2 oz) IBW/kg (Calculated) : 79.9 Heparin Dosing Weight: 127.1 kg  Vital Signs: Temp: 97.8 F (36.6 C) (08/25 1653) Temp Source: Oral (08/25 1653) BP: 126/112 (08/25 1700) Pulse Rate: 102 (08/25 1700)  Labs: Recent Labs    08/02/20 1107 08/02/20 1305 08/02/20 1659 08/03/20 0055 08/03/20 0634 08/03/20 1754  HGB 14.3  --   --   --  14.3  --   HCT 41.8  --   --   --  42.6  --   PLT 212  --   --   --  209  --   APTT  --   --  31  --   --   --   LABPROT  --   --  13.9  --   --   --   INR  --   --  1.1  --   --   --   HEPARINUNFRC  --   --   --  0.62  --  0.21*  CREATININE 1.59*  --   --   --  1.53*  --   TROPONINIHS 43* 35*  --   --   --   --     Estimated Creatinine Clearance: 80.9 mL/min (A) (by C-G formula based on SCr of 1.53 mg/dL (H)).   Medical History: Past Medical History:  Diagnosis Date  . Arthritis   . COPD (chronic obstructive pulmonary disease) (HCC)   . Diabetes (HCC)   . Hyperlipidemia   . Hypertension   . Kidney stone   . Obesity   . Tachycardia   . TIA (transient ischemic attack)     Medications:  Medications Prior to Admission  Medication Sig Dispense Refill Last Dose  . aspirin 81 MG chewable tablet Chew 1 tablet (81 mg total) by mouth daily. 180 tablet 0 Past Week at Unknown time  . clopidogrel (PLAVIX) 75 MG tablet Take 1 tablet (75 mg total) by mouth daily. 90 tablet 1 Past Week at Unknown time  . famotidine (PEPCID) 20 MG tablet Take 1 tablet (20 mg total) by mouth 2 (two) times daily. (Patient taking differently: Take 20 mg by mouth daily. ) 180 tablet 1 Past Week at Unknown time  . furosemide (LASIX) 20 MG tablet Take 1 tablet (20 mg  total) by mouth daily. 30 tablet 0 Past Week at Unknown time  . Krill Oil (OMEGA-3) 500 MG CAPS Take 1 tab po daily (Patient taking differently: Take 500 mg by mouth daily. ) 90 capsule 3 Past Week at Unknown time  . lisinopril (ZESTRIL) 10 MG tablet Take 1 tablet (10 mg total) by mouth daily. 30 tablet 1 Past Week at Unknown time  . meloxicam (MOBIC) 7.5 MG tablet Take 7.5 mg by mouth in the morning and at bedtime.    Past Week at Unknown time  . metoprolol succinate (TOPROL-XL) 50 MG 24 hr tablet Take 1 tablet (50 mg total) by mouth daily. 90 tablet 1 Past Week at Unknown time  . nicotine (NICODERM CQ - DOSED IN MG/24 HOURS) 21 mg/24hr patch Place 1 patch (21 mg total) onto the skin daily. 28 patch 0 Past Week at Unknown time  . rosuvastatin (CRESTOR) 20  MG tablet Take 1 tablet (20 mg total) by mouth daily. 90 tablet 1 Past Week at Unknown time  . traMADol (ULTRAM) 50 MG tablet Take 1 tablet (50 mg total) by mouth every 8 (eight) hours as needed. (Patient taking differently: Take 50 mg by mouth every 8 (eight) hours as needed for moderate pain. ) 45 tablet 0 prn at prn  . VENTOLIN HFA 108 (90 Base) MCG/ACT inhaler Inhale 2 puffs into the lungs every 6 (six) hours as needed for wheezing or shortness of breath. 48 g 1 prn at prn   Scheduled:  Infusions:  PRN:  Anti-infectives (From admission, onward)   Start     Dose/Rate Route Frequency Ordered Stop   08/04/20 0000  ceFAZolin (ANCEF) IVPB 1 g/50 mL premix  Status:  Discontinued       Note to Pharmacy: To be given in specials   1 g 100 mL/hr over 30 Minutes Intravenous  Once 08/03/20 0953 08/03/20 1103   08/03/20 1715  azithromycin (ZITHROMAX) tablet 500 mg  Status:  Discontinued       "Followed by" Linked Group Details   500 mg Oral Daily 08/02/20 1705 08/02/20 1727   08/03/20 1101  ceFAZolin (ANCEF) IVPB 2g/100 mL premix       Note to Pharmacy: To be given in specials   2 g 200 mL/hr over 30 Minutes Intravenous  Once 08/03/20 1103 08/03/20  1721   08/02/20 1800  azithromycin (ZITHROMAX) tablet 500 mg        500 mg Oral Daily 08/02/20 1737 08/05/20 1759   08/02/20 1705  azithromycin (ZITHROMAX) 500 mg in sodium chloride 0.9 % 250 mL IVPB  Status:  Discontinued       "Followed by" Linked Group Details   500 mg 250 mL/hr over 60 Minutes Intravenous Every 24 hours 08/02/20 1705 08/02/20 1727      Assessment: Pharmacy was consulted to start heparin for a PE. No DOAC PTA noted. CBC stable. Heparin restarted at 1217 on 8/25.   8/25 0055 HL 0.62 @ 2000 unit/hr 8/25 0900 HL - not collected.  8/25 1754 HL 0.21    Goal of Therapy:  Heparin level 0.3-0.7 units/ml Monitor platelets by anticoagulation protocol: Yes   Plan:  Heparin is subtherapeutic. S/p pulmonary thrombectomy on 8/25. Will not bolus as pt was therapeutic on current dose. Will increase infusion to 2150 units/hr. Reorder heparin level in 6 hours. CBC with AM labs.   Ronnald Ramp, PharmD, BCPS 08/03/2020,6:30 PM

## 2020-08-03 NOTE — Progress Notes (Signed)
Dr. Wyn Quaker at bedside, speaking with pt. Re: pulmonary thrombectomy, risks & benefits of procedure. Pt. Verbalizes understanding of conversation.

## 2020-08-04 ENCOUNTER — Encounter: Payer: Self-pay | Admitting: Vascular Surgery

## 2020-08-04 ENCOUNTER — Other Ambulatory Visit: Payer: Self-pay | Admitting: *Deleted

## 2020-08-04 ENCOUNTER — Other Ambulatory Visit: Payer: Self-pay

## 2020-08-04 ENCOUNTER — Ambulatory Visit: Payer: Medicare Other | Admitting: Physician Assistant

## 2020-08-04 ENCOUNTER — Telehealth: Payer: Self-pay

## 2020-08-04 DIAGNOSIS — J4520 Mild intermittent asthma, uncomplicated: Secondary | ICD-10-CM

## 2020-08-04 DIAGNOSIS — I2692 Saddle embolus of pulmonary artery without acute cor pulmonale: Principal | ICD-10-CM

## 2020-08-04 DIAGNOSIS — I82431 Acute embolism and thrombosis of right popliteal vein: Secondary | ICD-10-CM

## 2020-08-04 DIAGNOSIS — R0602 Shortness of breath: Secondary | ICD-10-CM

## 2020-08-04 LAB — CBC
HCT: 37 % — ABNORMAL LOW (ref 39.0–52.0)
Hemoglobin: 12.9 g/dL — ABNORMAL LOW (ref 13.0–17.0)
MCH: 32.1 pg (ref 26.0–34.0)
MCHC: 34.9 g/dL (ref 30.0–36.0)
MCV: 92 fL (ref 80.0–100.0)
Platelets: 226 10*3/uL (ref 150–400)
RBC: 4.02 MIL/uL — ABNORMAL LOW (ref 4.22–5.81)
RDW: 13.3 % (ref 11.5–15.5)
WBC: 10.6 10*3/uL — ABNORMAL HIGH (ref 4.0–10.5)
nRBC: 0 % (ref 0.0–0.2)

## 2020-08-04 LAB — BASIC METABOLIC PANEL
Anion gap: 9 (ref 5–15)
BUN: 28 mg/dL — ABNORMAL HIGH (ref 8–23)
CO2: 27 mmol/L (ref 22–32)
Calcium: 8.4 mg/dL — ABNORMAL LOW (ref 8.9–10.3)
Chloride: 98 mmol/L (ref 98–111)
Creatinine, Ser: 1.41 mg/dL — ABNORMAL HIGH (ref 0.61–1.24)
GFR calc Af Amer: 59 mL/min — ABNORMAL LOW (ref >60)
GFR calc non Af Amer: 51 mL/min — ABNORMAL LOW (ref >60)
Glucose, Bld: 167 mg/dL — ABNORMAL HIGH (ref 70–99)
Potassium: 4.8 mmol/L (ref 3.5–5.1)
Sodium: 134 mmol/L — ABNORMAL LOW (ref 135–145)

## 2020-08-04 LAB — HEPARIN LEVEL (UNFRACTIONATED)
Heparin Unfractionated: 0.35 IU/mL (ref 0.30–0.70)
Heparin Unfractionated: 0.66 IU/mL (ref 0.30–0.70)

## 2020-08-04 MED ORDER — DM-GUAIFENESIN ER 30-600 MG PO TB12
1.0000 | ORAL_TABLET | Freq: Two times a day (BID) | ORAL | Status: DC
  Administered 2020-08-04 – 2020-08-07 (×7): 1 via ORAL
  Filled 2020-08-04 (×7): qty 1

## 2020-08-04 MED ORDER — VENTOLIN HFA 108 (90 BASE) MCG/ACT IN AERS: 2.0000 | INHALATION_SPRAY | Freq: Four times a day (QID) | RESPIRATORY_TRACT | 1 refills | Status: DC | PRN

## 2020-08-04 MED ORDER — APIXABAN 5 MG PO TABS
10.0000 mg | ORAL_TABLET | Freq: Two times a day (BID) | ORAL | Status: DC
  Administered 2020-08-04 – 2020-08-07 (×7): 10 mg via ORAL
  Filled 2020-08-04 (×7): qty 2

## 2020-08-04 MED ORDER — APIXABAN 5 MG PO TABS: 5.0000 mg | ORAL_TABLET | Freq: Two times a day (BID) | ORAL | Status: DC

## 2020-08-04 NOTE — Telephone Encounter (Signed)
Spoke with [employer] that shower chair is not going to covered by Medicare

## 2020-08-04 NOTE — Consult Note (Signed)
ANTICOAGULATION CONSULT NOTE   Pharmacy Consult for Heparin Indication: pulmonary embolus  Allergies  Allergen Reactions  . Morphine Anxiety and Other (See Comments)    Hallucinations  . Morphine And Related Anxiety    Patient Measurements: Height: 6\' 1"  (185.4 cm) Weight: (!) 185.3 kg (408 lb 8.2 oz) IBW/kg (Calculated) : 79.9 Heparin Dosing Weight: 127.1 kg  Vital Signs: Temp: 97.8 F (36.6 C) (08/25 1653) Temp Source: Oral (08/25 1653) BP: 126/112 (08/25 1700) Pulse Rate: 102 (08/25 1700)  Labs: Recent Labs    08/02/20 1107 08/02/20 1305 08/02/20 1659 08/03/20 0055 08/03/20 0634 08/03/20 0634 08/03/20 1754 08/03/20 2038 08/03/20 2049 08/03/20 2231 08/04/20 0019  HGB   < >  --   --   --  14.3   < >  --  13.4  --   --  12.9*  HCT   < >  --   --   --  42.6  --   --  38.3*  --   --  37.0*  PLT   < >  --   --   --  209  --   --  220  --   --  226  APTT  --   --  31  --   --   --   --   --   --   --   --   LABPROT  --   --  13.9  --   --   --   --   --   --   --   --   INR  --   --  1.1  --   --   --   --   --   --   --   --   HEPARINUNFRC  --   --   --  0.62  --   --  0.21*  --   --   --  0.35  CREATININE   < >  --   --   --  1.53*  --   --  1.46*  --   --  1.41*  TROPONINIHS  --  35*  --   --   --   --   --   --  24* 20*  --    < > = values in this interval not displayed.    Estimated Creatinine Clearance: 87.8 mL/min (A) (by C-G formula based on SCr of 1.41 mg/dL (H)).   Medical History: Past Medical History:  Diagnosis Date  . Arthritis   . COPD (chronic obstructive pulmonary disease) (HCC)   . Diabetes (HCC)   . Hyperlipidemia   . Hypertension   . Kidney stone   . Obesity   . Tachycardia   . TIA (transient ischemic attack)     Medications:  Medications Prior to Admission  Medication Sig Dispense Refill Last Dose  . aspirin 81 MG chewable tablet Chew 1 tablet (81 mg total) by mouth daily. 180 tablet 0 Past Week at Unknown time  . clopidogrel  (PLAVIX) 75 MG tablet Take 1 tablet (75 mg total) by mouth daily. 90 tablet 1 Past Week at Unknown time  . famotidine (PEPCID) 20 MG tablet Take 1 tablet (20 mg total) by mouth 2 (two) times daily. (Patient taking differently: Take 20 mg by mouth daily. ) 180 tablet 1 Past Week at Unknown time  . furosemide (LASIX) 20 MG tablet Take 1 tablet (20 mg total) by mouth daily. 30 tablet 0  Past Week at Unknown time  . Krill Oil (OMEGA-3) 500 MG CAPS Take 1 tab po daily (Patient taking differently: Take 500 mg by mouth daily. ) 90 capsule 3 Past Week at Unknown time  . lisinopril (ZESTRIL) 10 MG tablet Take 1 tablet (10 mg total) by mouth daily. 30 tablet 1 Past Week at Unknown time  . meloxicam (MOBIC) 7.5 MG tablet Take 7.5 mg by mouth in the morning and at bedtime.    Past Week at Unknown time  . metoprolol succinate (TOPROL-XL) 50 MG 24 hr tablet Take 1 tablet (50 mg total) by mouth daily. 90 tablet 1 Past Week at Unknown time  . nicotine (NICODERM CQ - DOSED IN MG/24 HOURS) 21 mg/24hr patch Place 1 patch (21 mg total) onto the skin daily. 28 patch 0 Past Week at Unknown time  . rosuvastatin (CRESTOR) 20 MG tablet Take 1 tablet (20 mg total) by mouth daily. 90 tablet 1 Past Week at Unknown time  . traMADol (ULTRAM) 50 MG tablet Take 1 tablet (50 mg total) by mouth every 8 (eight) hours as needed. (Patient taking differently: Take 50 mg by mouth every 8 (eight) hours as needed for moderate pain. ) 45 tablet 0 prn at prn  . VENTOLIN HFA 108 (90 Base) MCG/ACT inhaler Inhale 2 puffs into the lungs every 6 (six) hours as needed for wheezing or shortness of breath. 48 g 1 prn at prn   Scheduled:  Infusions:  PRN:  Anti-infectives (From admission, onward)   Start     Dose/Rate Route Frequency Ordered Stop   08/04/20 0800  cefTRIAXone (ROCEPHIN) injection 1 g  Status:  Discontinued        1 g Intramuscular Every 24 hours 08/03/20 2235 08/03/20 2241   08/04/20 0800  cefTRIAXone (ROCEPHIN) 1 g in sodium  chloride 0.9 % 100 mL IVPB        1 g 200 mL/hr over 30 Minutes Intravenous Every 24 hours 08/03/20 2242     08/04/20 0000  ceFAZolin (ANCEF) IVPB 1 g/50 mL premix  Status:  Discontinued       Note to Pharmacy: To be given in specials   1 g 100 mL/hr over 30 Minutes Intravenous  Once 08/03/20 0953 08/03/20 1103   08/03/20 1715  azithromycin (ZITHROMAX) tablet 500 mg  Status:  Discontinued       "Followed by" Linked Group Details   500 mg Oral Daily 08/02/20 1705 08/02/20 1727   08/03/20 1101  ceFAZolin (ANCEF) IVPB 2g/100 mL premix       Note to Pharmacy: To be given in specials   2 g 200 mL/hr over 30 Minutes Intravenous  Once 08/03/20 1103 08/03/20 1721   08/02/20 1800  azithromycin (ZITHROMAX) tablet 500 mg  Status:  Discontinued        500 mg Oral Daily 08/02/20 1737 08/03/20 2055   08/02/20 1705  azithromycin (ZITHROMAX) 500 mg in sodium chloride 0.9 % 250 mL IVPB  Status:  Discontinued       "Followed by" Linked Group Details   500 mg 250 mL/hr over 60 Minutes Intravenous Every 24 hours 08/02/20 1705 08/02/20 1727      Assessment: Pharmacy was consulted to start heparin for a PE. No DOAC PTA noted. CBC stable. Heparin restarted at 1217 on 8/25.   8/25 0055 HL 0.62 @ 2000 unit/hr 8/25 0900 HL - not collected.  8/25 1754 HL 0.21    Goal of Therapy:  Heparin level 0.3-0.7 units/ml Monitor  platelets by anticoagulation protocol: Yes   Plan:  08/26 @ 0100 HL 0.35 therapeutic. Will continue current rate and will recheck HL at 0800, h/h trending down slightly will continue to monitor.  Thomasene Ripple, PharmD, BCPS 08/04/2020,1:45 AM

## 2020-08-04 NOTE — Evaluation (Signed)
Physical Therapy Evaluation Patient Details Name: Thomas Tanner MRN: [id number] DOB: 07/10/1951 Today's Date: 08/04/2020   History of Present Illness  69 y.o. male with PMH that includes: COPD, DM, HTN, TIA, tachycardia, and CAD as well as COVID in April; has been here multiple times in the last few months, including leg swelling, weight gain, stomach and scrotum swelling and also shortness of breath.  Here with DVT and PE, had thrombectomy for PEs 8/25.  Clinical Impression  Pt in CCU and still relatively acute post b/l PE thrombectomy, had run of SVT last night. Cleared by MD for working with and pt eager to do what he can and to get home (but "not before I am better, I've spent too much time here in the last few months).  He was able to manage getting to EOB relatively well and given his situation moved well with walker at EOB.  Pt did need assist with getting LEs back into bed and was not yet ready for prolonged bout of ambulation but feels confident in ability to return home once medically stable.  PT agrees and recommending HHPT when stable for d/c.     Follow Up Recommendations Home health PT    Equipment Recommendations  None recommended by PT    Recommendations for Other Services       Precautions / Restrictions Precautions Precautions: Fall Restrictions Weight Bearing Restrictions: No      Mobility  Bed Mobility Overal bed mobility: Needs Assistance Bed Mobility: Supine to Sit;Sit to Supine     Supine to sit: Min guard Sit to supine: Mod assist   General bed mobility comments: using rail pt was able to rise to sitting EOB w/o PT assist, did need assist to lift R LE back into bed  Transfers Overall transfer level: Modified independent Equipment used: Rolling walker (2 wheeled)             General transfer comment: Pt was able to rise to standing with relative ease from minimally elevated bed, not overly reliant on UEs but needing some  momentum  Ambulation/Gait Ambulation/Gait assistance: Min guard Gait Distance (Feet): 5 Feet Assistive device: Rolling walker (2 wheeled)       General Gait Details: Pt was able to take some EOB side shuffling steps with walker.  Deferred ambulation away from bed given his current heprin drip, O2 requirement, pain and acute status.  Stairs            Wheelchair Mobility    Modified Rankin (Stroke Patients Only)       Balance Overall balance assessment: Needs assistance Sitting-balance support: Bilateral upper extremity supported Sitting balance-Leahy Scale: Good Sitting balance - Comments: Pt with good safety and confident sitting EOB   Standing balance support: Bilateral upper extremity supported Standing balance-Leahy Scale: Fair Standing balance comment: Pt reliant on the walker, but able to maintain standing balance well and w/o assist                             Pertinent Vitals/Pain Pain Assessment: 0-10 Pain Score: 3  Pain Location: minimal chest pain, chronic R hip and knee OA pain    Home Living Family/patient expects to be discharged to:: Private residence Living Arrangements: Spouse/significant other Available Help at Discharge: Family;Available 24 hours/day Type of Home: House Home Access: Stairs to enter Entrance Stairs-Rails: Can reach both Entrance Stairs-Number of Steps: 5 Home Layout: One level Home Equipment: Dan Humphreys -  4 wheels;Adaptive equipment      Prior Function Level of Independence: Needs assistance   Gait / Transfers Assistance Needed: Pt reports that for that last many months he has only rarely walked more than then 30 or so ft to his bathroom but has been doing more and working with HHPT  ADL's / Homemaking Assistance Needed: family helps with some dressing and some ADLs        Hand Dominance        Extremity/Trunk Assessment   Upper Extremity Assessment Upper Extremity Assessment: Overall WFL for tasks  assessed    Lower Extremity Assessment Lower Extremity Assessment: Generalized weakness (R LE grossly 3-/5, L LE grossly 4/5)       Communication   Communication: No difficulties  Cognition Arousal/Alertness: Awake/alert Behavior During Therapy: WFL for tasks assessed/performed Overall Cognitive Status: Within Functional Limits for tasks assessed                                        General Comments General comments (skin integrity, edema, etc.): Spoke with MD about seeing pt while on heprin/post PE sx and cleared for mobility with PT    Exercises General Exercises - Lower Extremity Ankle Circles/Pumps: AROM;5 reps Hip ABduction/ADduction: Strengthening;5 reps Straight Leg Raises: Left;AROM;5 reps (unable on R 2/2 pain and weakness)   Assessment/Plan    PT Assessment Patient needs continued PT services  PT Problem List Decreased strength;Decreased range of motion;Decreased activity tolerance;Decreased balance;Decreased mobility;Decreased knowledge of use of DME;Decreased safety awareness;Pain       PT Treatment Interventions DME instruction;Gait training;Stair training;Functional mobility training;Therapeutic activities;Therapeutic exercise;Balance training;Neuromuscular re-education;Patient/family education    PT Goals (Current goals can be found in the Care Plan section)  Acute Rehab PT Goals Patient Stated Goal: go home PT Goal Formulation: With patient Time For Goal Achievement: 08/18/20 Potential to Achieve Goals: Good    Frequency Min 2X/week   Barriers to discharge        Co-evaluation               AM-PAC PT "6 Clicks" Mobility  Outcome Measure Help needed turning from your back to your side while in a flat bed without using bedrails?: A Little Help needed moving from lying on your back to sitting on the side of a flat bed without using bedrails?: A Little Help needed moving to and from a bed to a chair (including a wheelchair)?: A  Little Help needed standing up from a chair using your arms (e.g., wheelchair or bedside chair)?: A Little Help needed to walk in hospital room?: A Little Help needed climbing 3-5 steps with a railing? : A Lot 6 Click Score: 17    End of Session Equipment Utilized During Treatment: Gait belt;Oxygen Activity Tolerance: Patient tolerated treatment well;Patient limited by fatigue Patient left: with bed alarm set;with call bell/phone within reach Nurse Communication: Mobility status PT Visit Diagnosis: Difficulty in walking, not elsewhere classified (R26.2);Muscle weakness (generalized) (M62.81)    Time: 8786-7672 PT Time Calculation (min) (ACUTE ONLY): 37 min   Charges:   PT Evaluation $PT Eval Low Complexity: 1 Low PT Treatments $Therapeutic Activity: 8-22 mins        Malachi Pro, DPT 08/04/2020, 11:22 AM

## 2020-08-04 NOTE — Progress Notes (Signed)
Pt rested comfortably this shift, heparin off, eliquis started.  He refused hygiene, he did start incentive spirometry and flutter valve, non productive congested cough, mucinex also started, HFNC remains on 8 liters.  Left fem vasc site level 1 with bruising only

## 2020-08-04 NOTE — Progress Notes (Signed)
PROGRESS NOTE    Thomas Tanner  ZOX:[id number] DOB: [birth year] DOA: 08/02/2020 PCP: [facility]   Assessment & Plan:   Active Problems:   Hypertension   CAD in native artery   Chronic diastolic CHF (congestive heart failure) (HCC)   Tobacco abuse   COPD with acute exacerbation (HCC)   Acute on chronic heart failure (HCC)   Acute saddle pulmonary embolism (HCC)    Acute on chronic hypoxic respiratory failure: secondary to large saddle pulmonary embolism w/ large clot burden. S/p thrombectomy 08/03/20 as per vascular surg. On 3L [place] 24-7 as per pt. Continue on supplemental oxygen and wean back to baseline as tolerated  Pulmonary embolism: w/ large saddle emboli w/ large clot burden. S/p thrombectomy 08/03/20 as per vascular surg. D/C IV heparin and started on eliquis. Concern of NOACs in morbidly obese pts but will try eliquis to see how pt responds   RLE DVT: of right popliteal vein. D/c IV heparin and start eliquis.   COPD exacerbation: continue on IV solumedrol, azithromycin, & bronchodilators. Encourage incentive spirometry   Leukocytosis: possibly secondary to steroid use. Will continue to monitor   Chronic CHF: will hold lasix until pt receives treatment for pulmonary emboli. Likely diastolic. Echo shows EF >50%, no wall motion abnormalities, unable to determine diastolic function   CAD: hold aspirin and clopidogrel in setting of therapeutic anticoagulation  Hypertension: continue lisinopril and metoprolol, keep MAP >65  Hyperlipidemia: continue on statin   Morbid obesity: BMI 53.9. Would benefit from significant weight loss.   DVT prophylaxis: heparin  Code Status: full  Family Communication:  Disposition Plan: likely d/c home w/ home health    Consultants:   Vascular surg    Procedures:   Contrast injection right heart             2.  Thrombolysis with 4 mg of TPA in the left pulmonary arteries and 4 mg of TPA on the right pulmonary  arteries             3.  Mechanical thrombectomy using the penumbra CAT 12 device to the left lower lobe and left upper lobe pulmonary arteries well as mechanical thrombectomy using the penumbra CAT 12 device to the right lower lobe, right middle lobe, and right upper lobe pulmonary arteries             4.  Selective catheter placement right lower lobe, right middle lobe, and right upper lobe pulmonary arteries             5.  Selective catheter placement left lower lobe and left upper lobe pulmonary artery                Antimicrobials:    Subjective: Pt c/o fatigue and shortness of breath still   Objective: Vitals:   08/04/20 0300 08/04/20 0400 08/04/20 0500 08/04/20 0810  BP: (!) 97/51 117/63 119/78 106/74  Pulse: 90 87 89 85  Resp: 18 (!) 22 18   Temp:      TempSrc:      SpO2: 93% 92% 94%   Weight:      Height:        Intake/Output Summary (Last 24 hours) at 08/04/2020 0827 Last data filed at 08/04/2020 0500 Gross per 24 hour  Intake 640 ml  Output 1425 ml  Net -785 ml   Filed Weights   08/02/20 1230 08/03/20 0950  Weight: (!) 185.3 kg (!) 185.3 kg    Examination:  General  exam: Appears calm and comfortable  Respiratory system: decreased breath sounds b/l. No rhonchi  Cardiovascular system: S1 & S2 +. No rubs, gallops or clicks. B/l LE edema Gastrointestinal system: Abdomen is obese, soft and nontender. Normal bowel sounds heard. Central nervous system: Alert and oriented. Moves all 4 extremities Psychiatry: Judgement and insight appear normal. Flat mood and affect     Data Reviewed: I have personally reviewed following labs and imaging studies  CBC: Recent Labs  Lab 08/02/20 1107 08/03/20 0634 08/03/20 2038 08/04/20 0019  WBC 11.4* 11.5* 12.5* 10.6*  HGB 14.3 14.3 13.4 12.9*  HCT 41.8 42.6 38.3* 37.0*  MCV 93.7 96.4 91.6 92.0  PLT 212 209 220 226   Basic Metabolic Panel: Recent Labs  Lab 08/02/20 1107 08/02/20 1305 08/03/20 0634 08/03/20 2038  08/04/20 0019  NA 137  --  136 133* 134*  K 3.9  --  4.6 5.0 4.8  CL 95*  --  95* 95* 98  CO2 26  --  GLUCOSE 140*  --  128* 161* 167*  BUN 21  --  22 26* 28*  CREATININE 1.59*  --  1.53* 1.46* 1.41*  CALCIUM 9.0  --  9.1 8.3* 8.4*  MG  --  2.1 2.3 2.3  --    GFR: Estimated Creatinine Clearance: 87.8 mL/min (A) (by C-G formula based on SCr of 1.41 mg/dL (H)). Liver Function Tests: No results for input(s): AST, ALT, ALKPHOS, BILITOT, PROT, ALBUMIN in the last 168 hours. No results for input(s): LIPASE, AMYLASE in the last 168 hours. No results for input(s): AMMONIA in the last 168 hours. Coagulation Profile: Recent Labs  Lab 08/02/20 1659  INR 1.1   Cardiac Enzymes: No results for input(s): CKTOTAL, CKMB, CKMBINDEX, TROPONINI in the last 168 hours. BNP (last 3 results) No results for input(s): PROBNP in the last 8760 hours. HbA1C: No results for input(s): HGBA1C in the last 72 hours. CBG: Recent Labs  Lab 08/03/20 0958 08/03/20 1208 08/03/20 1701  GLUCAP 110* 139* 161*   Lipid Profile: No results for input(s): CHOL, HDL, LDLCALC, TRIG, CHOLHDL, LDLDIRECT in the last 72 hours. Thyroid Function Tests: No results for input(s): TSH, T4TOTAL, FREET4, T3FREE, THYROIDAB in the last 72 hours. Anemia Panel: No results for input(s): VITAMINB12, FOLATE, FERRITIN, TIBC, IRON, RETICCTPCT in the last 72 hours. Sepsis Labs: No results for input(s): PROCALCITON, LATICACIDVEN in the last 168 hours.  Recent Results (from the past 240 hour(s))  SARS Coronavirus 2 by RT PCR (hospital order, performed in [facility] hospital lab) Nasopharyngeal Nasopharyngeal Swab     Status: None   Collection Time: 08/02/20 12:12 PM   Specimen: Nasopharyngeal Swab  Result Value Ref Range Status   SARS Coronavirus 2 NEGATIVE NEGATIVE Final    Comment: (NOTE) SARS-CoV-2 target nucleic acids are NOT DETECTED.  The SARS-CoV-2 RNA is generally detectable in upper and lower respiratory  specimens during the acute phase of infection. The lowest concentration of SARS-CoV-2 viral copies this assay can detect is 250 copies / mL. A negative result does not preclude SARS-CoV-2 infection and should not be used as the sole basis for treatment or other patient management decisions.  A negative result may occur with improper specimen collection / handling, submission of specimen other than nasopharyngeal swab, presence of viral mutation(s) within the areas targeted by this assay, and inadequate number of viral copies (<250 copies / mL). A negative result must be combined with clinical observations, patient history, and epidemiological information.  Fact Sheet for Patients:   BoilerBrush.com.cy  Fact Sheet for Healthcare Providers: https://pope.com/  This test is not yet approved or  cleared by the [place] FDA and has been authorized for detection and/or diagnosis of SARS-CoV-2 by FDA under an Emergency Use Authorization (EUA).  This EUA will remain in effect (meaning this test can be used) for the duration of the COVID-19 declaration under Section 564(b)(1) of the Act, 21 U.S.C. section 360bbb-3(b)(1), unless the authorization is terminated or revoked sooner.  Performed at [facility], [address] [place], [place] [address]   MRSA PCR Screening     Status: None   Collection Time: 08/03/20  4:58 PM   Specimen: Nasal Mucosa; Nasopharyngeal  Result Value Ref Range Status   MRSA by PCR NEGATIVE NEGATIVE Final    Comment:        The GeneXpert MRSA Assay (FDA approved for NASAL specimens only), is one component of a comprehensive MRSA colonization surveillance program. It is not intended to diagnose MRSA infection nor to guide or monitor treatment for MRSA infections. Performed at [facility], [address], [place], [place] [address]          Radiology Studies: DG Chest 1  View  Result Date: 08/03/2020 CLINICAL DATA:  Ventricular ectopy EXAM: CHEST  1 VIEW COMPARISON:  08/02/2020 FINDINGS: Cardiac shadow is enlarged but stable. Aortic calcifications are again seen. Lungs are well aerated bilaterally without focal infiltrate. Increased bronchial markings are again identified similar to that seen on previous exams. No focal confluent infiltrate is noted. No sizable effusion is seen. No bony abnormality is noted. IMPRESSION: Stable appearance of the chest when compared with the previous day. Electronically Signed   By: Alcide Clever M.D.   On: 08/03/2020 21:15   CT Angio Chest PE W and/or Wo Contrast  Result Date: 08/02/2020 CLINICAL DATA:  PE suspected, high prob Shortness of breath and wheezing.  CHF diagnosis 6 months ago. EXAM: CT ANGIOGRAPHY CHEST WITH CONTRAST TECHNIQUE: Multidetector CT imaging of the chest was performed using the standard protocol during bolus administration of intravenous contrast. Multiplanar CT image reconstructions and MIPs were obtained to evaluate the vascular anatomy. CONTRAST:  8mL OMNIPAQUE IOHEXOL 350 MG/ML SOLN COMPARISON:  Chest CT 05/24/2020. radiograph earlier this day FINDINGS: Cardiovascular: Examination is positive for pulmonary embolus with moderate saddle thrombus and filling defects extending into all lobar and many segmental and subsegmental branches. Thromboembolic burden is large. There is straightening of the intraventricular septum with elevated RV to LV ratio of 2.04. There are coronary artery calcifications/stents. Aortic atherosclerosis without dissection. Conventional branching pattern from the aortic arch. No pericardial effusion. Mediastinum/Nodes: No mediastinal or hilar adenopathy. Heterogeneous thyroid gland just partially obscured on the left by dense contrast administration in the adjacent axillary vein. No esophageal wall thickening. Lungs/Pleura: No pulmonary infarct or focal airspace disease. No findings of pulmonary  edema or pleural effusion. There is mild central bronchial thickening. 5 mm perifissural nodule in the right upper lobe is likely an intrapulmonary lymph node, unchanged from recent prior, series 6, image 45. Upper Abdomen: No acute or unexpected finding. Musculoskeletal: Remote right rib fractures with nonunion. There are no acute or suspicious osseous abnormalities. Review of the MIP images confirms the above findings. IMPRESSION: 1. Examination is positive for pulmonary embolus with moderate saddle thrombus and filling defects extending into all lobar and many segmental and subsegmental branches. Thromboembolic burden is large. There is CT findings of right heart strain (RV/LV Ratio =  2.04) consistent with at least submassive (intermediate risk) PE. The presence of right heart strain has been associated with an increased risk of morbidity and mortality. 2. Mild central bronchial thickening, can be seen with bronchitis or reactive airways disease. 3. Coronary artery calcifications/stents. 4. 5 mm perifissural nodule in the right upper lobe is likely an intrapulmonary lymph node but nonspecific. No follow-up needed if patient is low-risk. Non-contrast chest CT can be considered in 12 months if patient is high-risk. This recommendation follows the consensus statement: Guidelines for Management of Incidental Pulmonary Nodules Detected on CT Images: From the Fleischner Society 2017; Radiology 2017; 284:228-243. Aortic Atherosclerosis (ICD10-I70.0). Critical Value/emergent results were called by telephone at the time of interpretation on 08/02/2020 at 4:58 pm to Dr Antoine Primas , who verbally acknowledged these results. Electronically Signed   By: Narda Rutherford M.D.   On: 08/02/2020 16:58   PERIPHERAL VASCULAR CATHETERIZATION  Result Date: 08/03/2020 See op note  US Venous Img Lower Bilateral (DVT)  Result Date: 08/03/2020 CLINICAL DATA:  Leg pain and swelling, history of pulmonary embolism EXAM: BILATERAL  LOWER EXTREMITY VENOUS DOPPLER ULTRASOUND TECHNIQUE: Gray-scale sonography with graded compression, as well as color Doppler and duplex ultrasound were performed to evaluate the lower extremity deep venous systems from the level of the common femoral vein and including the common femoral, femoral, profunda femoral, popliteal and calf veins including the posterior tibial, peroneal and gastrocnemius veins when visible. The superficial great saphenous vein was also interrogated. Spectral Doppler was utilized to evaluate flow at rest and with distal augmentation maneuvers in the common femoral, femoral and popliteal veins. COMPARISON:  None. FINDINGS: RIGHT LOWER EXTREMITY Common Femoral Vein: No evidence of thrombus. Normal compressibility, respiratory phasicity and response to augmentation. Saphenofemoral Junction: No evidence of thrombus. Normal compressibility and flow on color Doppler imaging. Profunda Femoral Vein: No evidence of thrombus. Normal compressibility and flow on color Doppler imaging. Femoral Vein: No evidence of thrombus. Normal compressibility, respiratory phasicity and response to augmentation. Popliteal Vein: Thrombus is noted the popliteal vein with decreased compressibility. Calf Veins: Calf veins are not well visualized. Superficial Great Saphenous Vein: No evidence of thrombus. Normal compressibility. Venous Reflux:  None. Other Findings:  None. LEFT LOWER EXTREMITY Common Femoral Vein: No evidence of thrombus. Normal compressibility, respiratory phasicity and response to augmentation. Saphenofemoral Junction: No evidence of thrombus. Normal compressibility and flow on color Doppler imaging. Profunda Femoral Vein: No evidence of thrombus. Normal compressibility and flow on color Doppler imaging. Femoral Vein: No evidence of thrombus. Normal compressibility, respiratory phasicity and response to augmentation. Popliteal Vein: No evidence of thrombus. Normal compressibility, respiratory phasicity  and response to augmentation. Calf Veins: Not well visualized. Superficial Great Saphenous Vein: No evidence of thrombus. Normal compressibility. Venous Reflux:  None. Other Findings:  None. IMPRESSION: Right popliteal vein deep venous thrombosis. No other focal abnormality is noted. Electronically Signed   By: Alcide Clever M.D.   On: 08/03/2020 15:58   DG Chest Port 1 View  Result Date: 08/02/2020 CLINICAL DATA:  Shortness of breath. EXAM: PORTABLE CHEST 1 VIEW COMPARISON:  July 20, 2020. FINDINGS: Mild cardiomegaly is noted with mild central pulmonary vascular congestion. No pneumothorax or pleural effusion is noted. Both lungs are clear. The visualized skeletal structures are unremarkable. IMPRESSION: Mild cardiomegaly with mild central pulmonary vascular congestion. Electronically Signed   By: Lupita Raider M.D.   On: 08/02/2020 12:22   ECHOCARDIOGRAM COMPLETE  Result Date: 08/03/2020    ECHOCARDIOGRAM REPORT   Patient  Name:   Thomas Tanner Date of Exam: 08/03/2020 Medical Rec #:  [id number]                Height:       73.0 in Accession #:    [id number]               Weight:       408.5 lb Date of Birth:  06-May-1951                 BSA:          2.915 m Patient Age:    69 years                 BP:           99/53 mmHg Patient Gender: M                        HR:           94 bpm. Exam Location:  [facility] Procedure: 2D Echo, Cardiac Doppler and Color Doppler Indications:     Pulmonary embolus 415.19  History:         Patient has prior history of Echocardiogram examinations, most                  recent 05/25/2020. TIA and COPD; Risk Factors:Hypertension and                  Dyslipidemia.  Sonographer:     Cristela Blue RDCS (AE) Referring Phys:  [id number] Mary Sella ECKSTAT Diagnosing Phys: Debbe Odea MD  Sonographer Comments: Technically difficult study due to poor echo windows, no apical window and no subcostal window. IMPRESSIONS  1. Imaging quality prevents adequate assessment for wall motion  abnormalities, wall thickness. recommends repeat echo with contrast study if clinically warranted.. Left ventricular ejection fraction, by estimation, is >50%. The left ventricle has normal  function. The left ventricle has no regional wall motion abnormalities. There is unable to assess due to image quality left ventricular hypertrophy. Left ventricular diastolic function could not be evaluated.  2. Right ventricular systolic function is normal. The right ventricular size is normal.  3. The mitral valve is grossly normal. No evidence of mitral valve regurgitation.  4. Tricuspid valve regurgitation not assessed.  5. The aortic valve was not well visualized. Aortic valve regurgitation not assessed.  6. Pulmonic valve regurgitation not assessed. FINDINGS  Left Ventricle: Imaging quality prevents adequate assessment for wall motion abnormalities, wall thickness. recommends repeat echo with contrast study if clinically warranted. Left ventricular ejection fraction, by estimation, is >50%. The left ventricle has normal function. The left ventricle has no regional wall motion abnormalities. The left ventricular internal cavity size was normal in size. There is unable to assess due to image quality left ventricular hypertrophy. Left ventricular diastolic function could not be evaluated. Right Ventricle: The right ventricular size is normal. Right vetricular wall thickness was not assessed. Right ventricular systolic function is normal. Left Atrium: Left atrial size was not well visualized. Right Atrium: Right atrial size was not well visualized. Pericardium: There is no evidence of pericardial effusion. Mitral Valve: The mitral valve is grossly normal. No evidence of mitral valve regurgitation. Tricuspid Valve: The tricuspid valve is not assessed. Tricuspid valve regurgitation not assessed. Aortic Valve: The aortic valve was not well visualized. Aortic valve regurgitation not assessed. Pulmonic Valve: The pulmonic valve  was not well visualized. Pulmonic valve regurgitation not  assessed. Aorta: The aortic root was not well visualized. Venous: The inferior vena cava was not well visualized. IAS/Shunts: The interatrial septum was not assessed.  LEFT VENTRICLE PLAX 2D LVIDd:         3.24 cm LVIDs:         2.39 cm LV PW:         1.43 cm LV IVS:        1.99 cm LVOT diam:     2.00 cm LVOT Area:     3.14 cm  LEFT ATRIUM         Index LA diam:    3.10 cm 1.06 cm/m   AORTA Ao Root diam: 2.90 cm  SHUNTS Systemic Diam: 2.00 cm Debbe OdeaBrian Agbor-Etang MD Electronically signed by Debbe OdeaBrian Agbor-Etang MD Signature Date/Time: 08/03/2020/3:51:25 PM    Final         Scheduled Meds: . Chlorhexidine Gluconate Cloth  6 each Topical Daily  . famotidine  20 mg Oral Daily  . ipratropium-albuterol  3 mL Nebulization Q6H  . lisinopril  10 mg Oral Daily  . methylPREDNISolone (SOLU-MEDROL) injection  40 mg Intravenous Q12H  . metoprolol succinate  50 mg Oral Daily  . rosuvastatin  20 mg Oral Daily  . sodium chloride flush  3 mL Intravenous Q12H   Continuous Infusions: . sodium chloride    . cefTRIAXone (ROCEPHIN)  IV 1 g (08/04/20 0809)  . heparin 2,150 Units/hr (08/04/20 0500)     LOS: 2 days    Time spent: 30 mins     Charise KillianJamiese M Christon Gallaway, MD [facility] Pager [phone]  If 7PM-7AM, please contact night-coverage [email or web address] 08/04/2020, 8:27 AM

## 2020-08-04 NOTE — Progress Notes (Addendum)
[facility] Daily Progress Note   Subjective:             1.  Contrast injection right heart             2.  Thrombolysis with 4 mg of TPA in the left pulmonary arteries and 4 mg of TPA on the right pulmonary arteries             3.  Mechanical thrombectomy using the penumbra CAT 12 device to the left lower lobe and left upper lobe pulmonary arteries well as mechanical thrombectomy using the penumbra CAT 12 device to the right lower lobe, right middle lobe, and right upper lobe pulmonary arteries             4.  Selective catheter placement right lower lobe, right middle lobe, and right upper lobe pulmonary arteries             5.  Selective catheter placement left lower lobe and left upper lobe pulmonary artery  Patient without complaint this AM.  Patient notes improvement in regard to shortness of breath.  Very appreciative of care he has been receiving.  Objective: Vitals:   08/04/20 0800 08/04/20 0810 08/04/20 0900 08/04/20 1000  BP: 106/74 106/74    Pulse: 81 85 92 98  Resp: 18  (!) 25 18  Temp: 97.6 F (36.4 C)     TempSrc: Oral     SpO2: 95%  95% 93%  Weight:      Height:        Intake/Output Summary (Last 24 hours) at 08/04/2020 1039 Last data filed at 08/04/2020 1000 Gross per 24 hour  Intake 704.03 ml  Output 1845 ml  Net -1140.97 ml   Physical Exam: A&Ox3, NAD CV: RRR Pulmonary: CTA Bilaterally, on nasal cannula -breathing nonlabored Abdomen: Soft, Non-tender, Non-distended Left groin:  PAD removed as it was falling off anyway.  Access site: Clean dry and intact Vascular:  Right lower extremity: No acute vascular compromise noted to extremity at this time.  Hard to palpate pedal pulses due to body habitus and some mild edema.  Good capillary refill.  Motor / sensory is intact.   Laboratory: CBC    Component Value Date/Time   WBC 10.6 (H) 08/04/2020 0019   HGB 12.9 (L) 08/04/2020 0019   HGB 14.6 06/11/2013 0404   HCT 37.0 (L) 08/04/2020  0019   HCT 42.1 06/11/2013 0404   PLT 226 08/04/2020 0019   PLT 212 06/11/2013 0404   BMET    Component Value Date/Time   NA 134 (L) 08/04/2020 0019   NA 140 06/11/2013 0404   K 4.8 08/04/2020 0019   K 5.1 06/11/2013 0404   CL 98 08/04/2020 0019   CL 107 06/11/2013 0404   CO2 27 08/04/2020 0019   CO2 29 06/11/2013 0404   GLUCOSE 167 (H) 08/04/2020 0019   GLUCOSE 97 06/11/2013 0404   BUN 28 (H) 08/04/2020 0019   BUN 18 06/11/2013 0404   CREATININE 1.41 (H) 08/04/2020 0019   CREATININE 0.87 06/11/2013 0404   CALCIUM 8.4 (L) 08/04/2020 0019   CALCIUM 9.1 06/11/2013 0404   GFRNONAA 51 (L) 08/04/2020 0019   GFRNONAA >60 06/11/2013 0404   GFRAA 59 (L) 08/04/2020 0019   GFRAA >60 06/11/2013 0404   Assessment/Planning: The patient is a 69 year old male with multiple medical issues who presented with submassive PE (with right heart strain on CTA) and right lower extremity DVT s/p pulmonary thrombectomy /  pulmonary lysis - POD#1  1) Patient notes improvement in shortness of breath.  Patient is on supplemental oxygen at baseline. 2) PAD removed.  Access site is clean dry and intact.  Some surrounding ecchymosis noted. 3) Will transition from heparin to Eliquis with loading dose. 4) Patient with right lower extremity DVT.  He understands that we will continue to surveilled this in our outpatient setting. 5) Encourage out of bed to chair, ambulation today. 6) OK to transfer out of ICU from our standpoint if medically stable  Discussed with Dr. Wallis Mart [facility] PA-C 08/04/2020 10:39 AM

## 2020-08-04 NOTE — Evaluation (Signed)
Occupational Therapy Evaluation Patient Details Name: Thomas Tanner MRN: [id number] DOB: [birth year] Today's Date: 08/04/2020    History of Present Illness 68 y.o. male with PMH that includes: COPD, DM, HTN, TIA, tachycardia, and CAD as well as COVID in April; has been here multiple times in the last few months, including leg swelling, weight gain, stomach and scrotum swelling and also shortness of breath.  Here with DVT and PE, had thrombectomy for PEs 8/25.   Clinical Impression   Thomas Tanner was seen for OT evaluation this date. Prior to hospital admission, pt was generally modified independent with BADL management using a 4WW for functional mobility. Pt lives with his spouse in a 1 level home with 5 STE. Currently pt demonstrates impairments as described below (See OT problem list) which functionally limit his ability to perform ADL/self-care tasks. Pt currently requires set up/supervision assist for UB ADL management in a seated position as well as MIN A for exertional ADL mgt including LB dressing/bathing.  Pt would benefit from skilled OT to address noted impairments and functional limitations (see below for any additional details) in order to maximize safety and independence while minimizing falls risk and caregiver burden. Upon hospital discharge, recommend HHOT to maximize pt safety and return to functional independence during meaningful occupations of daily life.    Follow Up Recommendations  Home health OT;Supervision - Intermittent    Equipment Recommendations  3 in 1 bedside commode [facility])    Recommendations for Other Services       Precautions / Restrictions Precautions Precautions: Fall Restrictions Weight Bearing Restrictions: No      Mobility Bed Mobility Overal bed mobility: Needs Assistance Bed Mobility: Supine to Sit;Sit to Supine     Supine to sit: Min guard Sit to supine: Mod assist   General bed mobility comments: Deferred, pt politely  declines mobility 2/2 fatigue. Per PT treatment note, required min guard for sup>sit and MOD A for BLE mgt with sit>sup.  Transfers Overall transfer level: Modified independent Equipment used: Rolling walker (2 wheeled)             General transfer comment: Deferred. Per chart, pt mod I for STS using RW this date.    Balance Overall balance assessment: Needs assistance Sitting-balance support: Bilateral upper extremity supported Sitting balance-Leahy Scale: Good Sitting balance - Comments: Pt with good safety and confident sitting EOB   Standing balance support: Bilateral upper extremity supported Standing balance-Leahy Scale: Fair Standing balance comment: Pt reliant on the walker, but able to maintain standing balance well and w/o assist                           ADL either performed or assessed with clinical judgement   ADL Overall ADL's : Needs assistance/impaired                                       General ADL Comments: Pt functionally limited by generalized weakness, decreased activity tolerance, and cardiopulmonary status. Requires set-up/supervision for seated UB ADL management including grooming, bathing, and dressing. MIN A for exertional tasks such as LB dressing & bathing.     Vision Baseline Vision/History: Wears glasses Wears Glasses: At all times Patient Visual Report: No change from baseline       Perception     Praxis      Pertinent Vitals/Pain Pain Assessment:  No/denies pain Pain Score: 3  Pain Location: minimal chest pain, chronic R hip and knee OA pain     Hand Dominance Right   Extremity/Trunk Assessment Upper Extremity Assessment Upper Extremity Assessment: Overall WFL for tasks assessed (Grip, AROM, [facility] WFL.)   Lower Extremity Assessment Lower Extremity Assessment: Generalized weakness;Defer to PT evaluation       Communication Communication Communication: No difficulties   Cognition Arousal/Alertness:  Awake/alert Behavior During Therapy: WFL for tasks assessed/performed Overall Cognitive Status: Within Functional Limits for tasks assessed                                 General Comments: A&O x4, generally conversational and participatory in care.   General Comments  Per chart, MD cleared pt for participation in therapy services. RN also cleared pt for OT eval prior to session and indicated continuous heparin DC'd pt earlier this date and pt now on PO anticoag therapy.    Exercises Exercises: General Lower Extremity General Exercises - Lower Extremity Ankle Circles/Pumps: AROM;5 reps Hip ABduction/ADduction: Strengthening;5 reps Straight Leg Raises: Left;AROM;5 reps (unable on R 2/2 pain and weakness) Other Exercises Other Exercises: Pt educated on role of OT in acute setting, safe use of AE/DME for ADL management, and limited ECS education including pursed lip breathing.   Shoulder Instructions      Home Living Family/patient expects to be discharged to:: Private residence Living Arrangements: Spouse/significant other Available Help at Discharge: Family;Available 24 hours/day Type of Home: House Home Access: Stairs to enter [employer] of Steps: 5 Entrance Stairs-Rails: Can reach both Home Layout: One level     Bathroom Shower/Tub: Chief Strategy Officer: Standard     Home Equipment: Environmental consultant - 4 wheels;Adaptive equipment;Shower seat;Grab bars - Probation officer: Reacher        Prior Functioning/Environment Level of Independence: Needs assistance  Gait / Transfers Assistance Needed: Pt reports he has been limited in mobility since his COVID diagnosis, he endorsess only HH mobility. ADL's / Homemaking Assistance Needed: Pt states he has used 3L O2 at baseline. He denies needing assist for ADL management, however, recently began "bird baths" rather than full showers 2/2 fatigue. Independent for dressing. Son assists with  driving.            OT Problem List: Cardiopulmonary status limiting activity;Decreased activity tolerance;Decreased safety awareness;Impaired balance (sitting and/or standing);Decreased knowledge of use of DME or AE      OT Treatment/Interventions: Self-care/ADL training;Therapeutic exercise;Therapeutic activities;DME and/or AE instruction;Patient/family education;Balance training;Energy conservation    OT Goals(Current goals can be found in the care plan section) Acute Rehab OT Goals Patient Stated Goal: go home OT Goal Formulation: With patient Time For Goal Achievement: 08/18/20 Potential to Achieve Goals: Good  OT Frequency: Min 1X/week   Barriers to D/C:            Co-evaluation              AM-PAC OT "6 Clicks" Daily Activity     Outcome Measure Help from another person eating meals?: A Little Help from another person taking care of personal grooming?: A Little Help from another person toileting, which includes using toliet, bedpan, or urinal?: A Little Help from another person bathing (including washing, rinsing, drying)?: A Little Help from another person to put on and taking off regular upper body clothing?: A Little Help from another person to put on and taking off  regular lower body clothing?: A Little 6 Click Score: 18   End of Session    Activity Tolerance: Patient limited by fatigue Patient left: in bed;with call bell/phone within reach;with bed alarm set  OT Visit Diagnosis: Other abnormalities of gait and mobility (R26.89);Muscle weakness (generalized) (M62.81)                Time: 1660-6301 OT Time Calculation (min): 19 min Charges:  OT General Charges $OT Visit: 1 Visit OT Evaluation $OT Eval Moderate Complexity: 1 Mod OT Treatments $Self Care/Home Management : 8-22 mins  Rockney Ghee, M.S., OTR/L Ascom: [phone] 08/04/20, 2:15 PM

## 2020-08-04 NOTE — Patient Outreach (Signed)
[facility]) Care Management [facility] CM Telephone Outreach Care Coordination  08/04/2020  Thomas Tanner Sep 16, 1951 [id number]  [facility] CM Telephone Outreach Care Coordination re:   [name], 70 y/o male referred to [facility] CM by [facility] RN [facility] Liaison8/10/21for multiple recent hospital and ED visits.  Patient was recently hospitalized August 2-9, 2021 for acute respiratory failure with hypoxia/ CHF exacerbation with shortness of breath and significant peripheral edema. He was discharged home to self-care with home health services in place for PT/ OT. Patient has history including, but not limited to, dCHF; COPD; CKD-III; CAD; HTN/ HLD; DM' chronic pain; morbid obesity, pressure ulcer, and COVID-19 infection.  Patient re-presented to ED 07/20/20 for hypotension and [name] was re-admitted to hospital August 11-13, 2093for hypotension and AKI; he was again discharged home to self-care with established home health services in place.  Unfortunately, patient experienced hospital re-admission 11 days after his last discharge, August 02, 2020 with ongoing progressive shortness of breath, despite having visited his PCP office the day prior to his admission.  He was diagnosed with multiple large pulmonary emboli with large saddle pulmonary embolus causing (R) heart strain.  He underwent urgent thrombectomy on August 03, 2020 and is currently hospitalized with pending discharge disposition plans.    Secure communication via EMR sent to [facility] RN [facility] Liaisons [name] and [name], notifying of patient's current hospital admission.  Plan:  [facility] RN CM will follow patient's progress while hospitalized and collaborate with [facility] liaison's for discharge disposition.  Caryl Pina, RN, BSN, [employer] [facility] Care Management  [phone]

## 2020-08-04 NOTE — Progress Notes (Signed)
ANTICOAGULATION CONSULT NOTE  Pharmacy Consult for Eliquis Indication: PE/DVT  Allergies  Allergen Reactions  . Morphine Anxiety and Other (See Comments)    Hallucinations  . Morphine And Related Anxiety    Patient Measurements: Height: 6\' 1"  (185.4 cm) Weight: (!) 185.3 kg (408 lb 8.2 oz) IBW/kg (Calculated) : 79.9  Vital Signs: Temp: 97.6 F (36.4 C) (08/26 0800) Temp Source: Oral (08/26 0800) BP: 106/74 (08/26 0810) Pulse Rate: 98 (08/26 1000)  Labs: Recent Labs     0000 08/02/20 1305 08/02/20 1659 08/03/20 0055 08/03/20 08/05/20 08/03/20 0634 08/03/20 1754 08/03/20 2038 08/03/20 2049 08/03/20 2231 08/04/20 0019 08/04/20 0856  HGB   < >  --   --   --  14.3   < >  --  13.4  --   --  12.9*  --   HCT   < >  --   --   --  42.6  --   --  38.3*  --   --  37.0*  --   PLT   < >  --   --   --  209  --   --  220  --   --  226  --   APTT  --   --  31  --   --   --   --   --   --   --   --   --   LABPROT  --   --  13.9  --   --   --   --   --   --   --   --   --   INR  --   --  1.1  --   --   --   --   --   --   --   --   --   HEPARINUNFRC  --   --   --    < >  --   --  0.21*  --   --   --  0.35 0.66  CREATININE   < >  --   --   --  1.53*  --   --  1.46*  --   --  1.41*  --   TROPONINIHS  --  35*  --   --   --   --   --   --  24* 20*  --   --    < > = values in this interval not displayed.    Estimated Creatinine Clearance: 87.8 mL/min (A) (by C-G formula based on SCr of 1.41 mg/dL (H)).   Medical History: Past Medical History:  Diagnosis Date  . Arthritis   . COPD (chronic obstructive pulmonary disease) (HCC)   . Diabetes (HCC)   . Hyperlipidemia   . Hypertension   . Kidney stone   . Obesity   . Tachycardia   . TIA (transient ischemic attack)      Assessment: 69 year old male presented with submassive PE and right lower extremity DVT s/p pulmonary thrombectomy/lysis. Patient previously on heparin, now to start on Eliquis.  Goal of Therapy:  Monitor  platelets by anticoagulation protocol: Yes   Plan:  Eliquis 10 mg BID x 14 doses followed by 5 mg BID.  79, PharmD 08/04/2020,11:38 AM

## 2020-08-05 LAB — BASIC METABOLIC PANEL
Anion gap: 8 (ref 5–15)
BUN: 27 mg/dL — ABNORMAL HIGH (ref 8–23)
CO2: 30 mmol/L (ref 22–32)
Calcium: 8.6 mg/dL — ABNORMAL LOW (ref 8.9–10.3)
Chloride: 97 mmol/L — ABNORMAL LOW (ref 98–111)
Creatinine, Ser: 1.2 mg/dL (ref 0.61–1.24)
GFR calc Af Amer: >60 mL/min (ref >60)
GFR calc non Af Amer: >60 mL/min (ref >60)
Glucose, Bld: 143 mg/dL — ABNORMAL HIGH (ref 70–99)
Potassium: 4.9 mmol/L (ref 3.5–5.1)
Sodium: 135 mmol/L (ref 135–145)

## 2020-08-05 LAB — CBC
HCT: 38.4 % — ABNORMAL LOW (ref 39.0–52.0)
Hemoglobin: 13.1 g/dL (ref 13.0–17.0)
MCH: 32.1 pg (ref 26.0–34.0)
MCHC: 34.1 g/dL (ref 30.0–36.0)
MCV: 94.1 fL (ref 80.0–100.0)
Platelets: 244 10*3/uL (ref 150–400)
RBC: 4.08 MIL/uL — ABNORMAL LOW (ref 4.22–5.81)
RDW: 13.4 % (ref 11.5–15.5)
WBC: 8.4 10*3/uL (ref 4.0–10.5)
nRBC: 0 % (ref 0.0–0.2)

## 2020-08-05 NOTE — Progress Notes (Addendum)
Pt refused hygiene and mobility this AM and at noon this shift, stated "I just don't feel like it."  When asked how he would do at home if unwilling to get OOB here he stated "I'll be ok when I get home."  I asked if he felt he would benefit from STR and he said "no."   This afternoon approx 1400 he felt nauseous; I gave zofran and reminded him he had not had a BM in two days.  He admitted to feeling the need for BM so I got a BSC for him.  He had a great deal of difficulty getting OOB, legs seem very weak and he has to rock back and forth several times to get to his feet, DOE also observed even with his baseline 3 LPM [place].  We used front wheel walker to get up but he could not tolerate the BSC (too small).  With the aid of other staff we were able to get him off the [facility] and to the corner commode in the room.  He ambulated back to the bed with a great deal of difficulty, did not wish to sit up in a chair.   He states he "just got oxygen at home, and haven't used it much."  Also admits to sleeping in recliner, but would like to get back to sleeping in bed with his wife. He states he has not been able to lie flat in bed since April.  He states he knows he had COVID in April although he was never tested.  His wife was positive for COVID in April and he had all the symptoms at the same time as her.  It appears his health has declined since April.

## 2020-08-05 NOTE — Progress Notes (Signed)
 Vein & Vascular Surgery Daily Progress Note   Subjective: (08/03/20)  1. Contrast injection right heart 2. Thrombolysiswith 4 mg of TPA in the left pulmonary arteries and 4 mg of TPA on the rightpulmonary arteries 3. Mechanical thrombectomyusing the penumbra CAT 12 device to the left lower lobe and left upper lobe pulmonary arteries well as mechanical thrombectomy using the penumbra CAT 12 device to the right lower lobe, right middle lobe, and right upper lobepulmonary arteries 4. Selective catheter placement rightlower lobe, right middle lobe, and right upper lobe pulmonary arteries 5. Selective catheter placement leftlower lobe and left upper lobe pulmonary artery  1) Patient continues to note improvement in his dyspnea 2) Transition from heparin to Eliquis yesterday, hemoglobin is stable 3) Patient with right popliteal DVT. Will continue to surveilled his DVT in the outpatient setting.  Vascular surgery to sign off at this time Please reconsult if there is anything else we can assist with.  Discussed with Dr. Wallis Mart Ailea Rhatigan PA-C 08/05/2020 10:50 AM

## 2020-08-05 NOTE — Progress Notes (Signed)
PROGRESS NOTE    Thomas Tanner  HCW:[id number] DOB: 06-Apr-1951 DOA: 08/02/2020 PCP: [facility]   Assessment & Plan:   Active Problems:   Hypertension   CAD in native artery   Chronic diastolic CHF (congestive heart failure) (HCC)   Tobacco abuse   COPD with acute exacerbation (HCC)   Acute on chronic heart failure (HCC)   Acute saddle pulmonary embolism (HCC)    Acute on chronic hypoxic respiratory failure: secondary to large saddle pulmonary embolism w/ large clot burden. S/p thrombectomy 08/03/20 as per vascular surg. On 3L [place] 24-7 as per pt. Continue on supplemental oxygen and wean back to baseline as tolerated  Pulmonary embolism: w/ large saddle emboli w/ large clot burden. S/p thrombectomy 08/03/20 as per vascular surg. D/C IV heparin and continue on eliquis. Concern of NOACs in morbidly obese pts but will try eliquis to see how pt responds   RLE DVT: of right popliteal vein. D/c IV heparin and continue eliquis.   COPD exacerbation: continue on IV solumedrol, azithromycin, & bronchodilators. Encourage incentive spirometry   Leukocytosis: resolved  Chronic CHF: will hold lasix until pt receives treatment for pulmonary emboli. Likely diastolic. Echo shows EF >50%, no wall motion abnormalities, unable to determine diastolic function   CAD: hold aspirin and clopidogrel in setting of therapeutic anticoagulation. Last stent approx 2 years, will discuss need of aspirin & plavix in this setting w/ cardio   Hypertension: continue lisinopril and metoprolol, keep MAP >65  Hyperlipidemia: continue on statin   Morbid obesity: BMI 53.9. Would benefit from significant weight loss.   DVT prophylaxis: eliquis  Code Status: full  Family Communication:  Disposition Plan: likely d/c home w/ home health    Consultants:   Vascular surg    Procedures:   Contrast injection right heart             2.  Thrombolysis with 4 mg of TPA in the left pulmonary  arteries and 4 mg of TPA on the right pulmonary arteries             3.  Mechanical thrombectomy using the penumbra CAT 12 device to the left lower lobe and left upper lobe pulmonary arteries well as mechanical thrombectomy using the penumbra CAT 12 device to the right lower lobe, right middle lobe, and right upper lobe pulmonary arteries             4.  Selective catheter placement right lower lobe, right middle lobe, and right upper lobe pulmonary arteries             5.  Selective catheter placement left lower lobe and left upper lobe pulmonary artery                Antimicrobials:    Subjective: Pt c/o malaise   Objective: Vitals:   08/05/20 0300 08/05/20 0400 08/05/20 0500 08/05/20 0736  BP: (!) 103/58 119/72 (!) 145/66   Pulse: 89 85 85   Resp: 16 18 18    Temp:      TempSrc:      SpO2: 95% 95% 90% 94%  Weight:      Height:        Intake/Output Summary (Last 24 hours) at 08/05/2020 0816 Last data filed at 08/05/2020 0400 Gross per 24 hour  Intake 883.42 ml  Output 3970 ml  Net -3086.58 ml   Filed Weights   08/02/20 1230 08/03/20 0950  Weight: (!) 185.3 kg (!) 185.3 kg  Examination:  General exam: Appears calm and comfortable. Morbidly obese  Respiratory system: decreased breath sounds b/l. No rales Cardiovascular system: S1 & S2 +. No rubs, gallops or clicks. B/l LE edema Gastrointestinal system: Abdomen is obese, soft and nontender. Normal bowel sounds heard. Central nervous system: Alert and oriented. Moves all 4 extremities Psychiatry: Judgement and insight appear normal. Flat mood and affect     Data Reviewed: I have personally reviewed following labs and imaging studies  CBC: Recent Labs  Lab 08/02/20 1107 08/03/20 0634 08/03/20 2038 08/04/20 0019 08/05/20 0504  WBC 11.4* 11.5* 12.5* 10.6* 8.4  HGB 14.3 14.3 13.4 12.9* 13.1  HCT 41.8 42.6 38.3* 37.0* 38.4*  MCV 93.7 96.4 91.6 92.0 94.1  PLT 212 209 220 226 244   Basic Metabolic Panel: Recent  Labs  Lab 08/02/20 1107 08/02/20 1305 08/03/20 0634 08/03/20 2038 08/04/20 0019 08/05/20 0504  NA 137  --  136 133* 134* 135  K 3.9  --  4.6 5.0 4.8 4.9  CL 95*  --  95* 95* 98 97*  CO2 26  --  25 27 27 30   GLUCOSE 140*  --  128* 161* 167* 143*  BUN 21  --  22 26* 28* 27*  CREATININE 1.59*  --  1.53* 1.46* 1.41* 1.20  CALCIUM 9.0  --  9.1 8.3* 8.4* 8.6*  MG  --  2.1 2.3 2.3  --   --    GFR: Estimated Creatinine Clearance: 103.2 mL/min (by C-G formula based on SCr of 1.2 mg/dL). Liver Function Tests: No results for input(s): AST, ALT, ALKPHOS, BILITOT, PROT, ALBUMIN in the last 168 hours. No results for input(s): LIPASE, AMYLASE in the last 168 hours. No results for input(s): AMMONIA in the last 168 hours. Coagulation Profile: Recent Labs  Lab 08/02/20 1659  INR 1.1   Cardiac Enzymes: No results for input(s): CKTOTAL, CKMB, CKMBINDEX, TROPONINI in the last 168 hours. BNP (last 3 results) No results for input(s): PROBNP in the last 8760 hours. HbA1C: No results for input(s): HGBA1C in the last 72 hours. CBG: Recent Labs  Lab 08/03/20 0958 08/03/20 1208 08/03/20 1701  GLUCAP 110* 139* 161*   Lipid Profile: No results for input(s): CHOL, HDL, LDLCALC, TRIG, CHOLHDL, LDLDIRECT in the last 72 hours. Thyroid Function Tests: No results for input(s): TSH, T4TOTAL, FREET4, T3FREE, THYROIDAB in the last 72 hours. Anemia Panel: No results for input(s): VITAMINB12, FOLATE, FERRITIN, TIBC, IRON, RETICCTPCT in the last 72 hours. Sepsis Labs: No results for input(s): PROCALCITON, LATICACIDVEN in the last 168 hours.  Recent Results (from the past 240 hour(s))  SARS Coronavirus 2 by RT PCR (hospital order, performed in [facility] hospital lab) Nasopharyngeal Nasopharyngeal Swab     Status: None   Collection Time: 08/02/20 12:12 PM   Specimen: Nasopharyngeal Swab  Result Value Ref Range Status   SARS Coronavirus 2 NEGATIVE NEGATIVE Final    Comment: (NOTE) SARS-CoV-2 target  nucleic acids are NOT DETECTED.  The SARS-CoV-2 RNA is generally detectable in upper and lower respiratory specimens during the acute phase of infection. The lowest concentration of SARS-CoV-2 viral copies this assay can detect is 250 copies / mL. A negative result does not preclude SARS-CoV-2 infection and should not be used as the sole basis for treatment or other patient management decisions.  A negative result may occur with improper specimen collection / handling, submission of specimen other than nasopharyngeal swab, presence of viral mutation(s) within the areas targeted by this assay, and inadequate  number of viral copies (<250 copies / mL). A negative result must be combined with clinical observations, patient history, and epidemiological information.  Fact Sheet for Patients:   BoilerBrush.com.cyhttps://www.fda.gov/media/136312/download  Fact Sheet for Healthcare Providers: https://pope.com/https://www.fda.gov/media/136313/download  This test is not yet approved or  cleared by the [place] FDA and has been authorized for detection and/or diagnosis of SARS-CoV-2 by FDA under an Emergency Use Authorization (EUA).  This EUA will remain in effect (meaning this test can be used) for the duration of the COVID-19 declaration under Section 564(b)(1) of the Act, 21 U.S.C. section 360bbb-3(b)(1), unless the authorization is terminated or revoked sooner.  Performed at [facility], [address] [place], [place] [address]   MRSA PCR Screening     Status: None   Collection Time: 08/03/20  4:58 PM   Specimen: Nasal Mucosa; Nasopharyngeal  Result Value Ref Range Status   MRSA by PCR NEGATIVE NEGATIVE Final    Comment:        The GeneXpert MRSA Assay (FDA approved for NASAL specimens only), is one component of a comprehensive MRSA colonization surveillance program. It is not intended to diagnose MRSA infection nor to guide or monitor treatment for MRSA infections. Performed at [facility], [address] [place], [place] [address]          Radiology Studies: DG Chest 1 View  Result Date: 08/03/2020 CLINICAL DATA:  Ventricular ectopy EXAM: CHEST  1 VIEW COMPARISON:  08/02/2020 FINDINGS: Cardiac shadow is enlarged but stable. Aortic calcifications are again seen. Lungs are well aerated bilaterally without focal infiltrate. Increased bronchial markings are again identified similar to that seen on previous exams. No focal confluent infiltrate is noted. No sizable effusion is seen. No bony abnormality is noted. IMPRESSION: Stable appearance of the chest when compared with the previous day. Electronically Signed   By: Alcide CleverMark  Lukens M.D.   On: 08/03/2020 21:15   PERIPHERAL VASCULAR CATHETERIZATION  Result Date: 08/03/2020 See op note  US Venous Img Lower Bilateral (DVT)  Result Date: 08/03/2020 CLINICAL DATA:  Leg pain and swelling, history of pulmonary embolism EXAM: BILATERAL LOWER EXTREMITY VENOUS DOPPLER ULTRASOUND TECHNIQUE: Gray-scale sonography with graded compression, as well as color Doppler and duplex ultrasound were performed to evaluate the lower extremity deep venous systems from the level of the common femoral vein and including the common femoral, femoral, profunda femoral, popliteal and calf veins including the posterior tibial, peroneal and gastrocnemius veins when visible. The superficial great saphenous vein was also interrogated. Spectral Doppler was utilized to evaluate flow at rest and with distal augmentation maneuvers in the common femoral, femoral and popliteal veins. COMPARISON:  None. FINDINGS: RIGHT LOWER EXTREMITY Common Femoral Vein: No evidence of thrombus. Normal compressibility, respiratory phasicity and response to augmentation. Saphenofemoral Junction: No evidence of thrombus. Normal compressibility and flow on color Doppler imaging. Profunda Femoral Vein: No evidence of thrombus. Normal compressibility and flow on color Doppler imaging.  Femoral Vein: No evidence of thrombus. Normal compressibility, respiratory phasicity and response to augmentation. Popliteal Vein: Thrombus is noted the popliteal vein with decreased compressibility. Calf Veins: Calf veins are not well visualized. Superficial Great Saphenous Vein: No evidence of thrombus. Normal compressibility. Venous Reflux:  None. Other Findings:  None. LEFT LOWER EXTREMITY Common Femoral Vein: No evidence of thrombus. Normal compressibility, respiratory phasicity and response to augmentation. Saphenofemoral Junction: No evidence of thrombus. Normal compressibility and flow on color Doppler imaging. Profunda Femoral Vein: No evidence of thrombus. Normal compressibility and flow on  color Doppler imaging. Femoral Vein: No evidence of thrombus. Normal compressibility, respiratory phasicity and response to augmentation. Popliteal Vein: No evidence of thrombus. Normal compressibility, respiratory phasicity and response to augmentation. Calf Veins: Not well visualized. Superficial Great Saphenous Vein: No evidence of thrombus. Normal compressibility. Venous Reflux:  None. Other Findings:  None. IMPRESSION: Right popliteal vein deep venous thrombosis. No other focal abnormality is noted. Electronically Signed   By: Alcide Clever M.D.   On: 08/03/2020 15:58   ECHOCARDIOGRAM COMPLETE  Result Date: 08/03/2020    ECHOCARDIOGRAM REPORT   Patient Name:   Thomas Tanner Date of Exam: 08/03/2020 Medical Rec #:  [id number]                Height:       73.0 in Accession #:    [id number]               Weight:       408.5 lb Date of Birth:  01-Feb-1951                 BSA:          2.915 m Patient Age:    69 years                 BP:           99/53 mmHg Patient Gender: M                        HR:           94 bpm. Exam Location:  [facility] Procedure: 2D Echo, Cardiac Doppler and Color Doppler Indications:     Pulmonary embolus 415.19  History:         Patient has prior history of Echocardiogram examinations,  most                  recent 05/25/2020. TIA and COPD; Risk Factors:Hypertension and                  Dyslipidemia.  Sonographer:     Cristela Blue RDCS (AE) Referring Phys:  [id number] Mary Sella ECKSTAT Diagnosing Phys: Debbe Odea MD  Sonographer Comments: Technically difficult study due to poor echo windows, no apical window and no subcostal window. IMPRESSIONS  1. Imaging quality prevents adequate assessment for wall motion abnormalities, wall thickness. recommends repeat echo with contrast study if clinically warranted.. Left ventricular ejection fraction, by estimation, is >50%. The left ventricle has normal  function. The left ventricle has no regional wall motion abnormalities. There is unable to assess due to image quality left ventricular hypertrophy. Left ventricular diastolic function could not be evaluated.  2. Right ventricular systolic function is normal. The right ventricular size is normal.  3. The mitral valve is grossly normal. No evidence of mitral valve regurgitation.  4. Tricuspid valve regurgitation not assessed.  5. The aortic valve was not well visualized. Aortic valve regurgitation not assessed.  6. Pulmonic valve regurgitation not assessed. FINDINGS  Left Ventricle: Imaging quality prevents adequate assessment for wall motion abnormalities, wall thickness. recommends repeat echo with contrast study if clinically warranted. Left ventricular ejection fraction, by estimation, is >50%. The left ventricle has normal function. The left ventricle has no regional wall motion abnormalities. The left ventricular internal cavity size was normal in size. There is unable to assess due to image quality left ventricular hypertrophy. Left ventricular diastolic function could not be evaluated. Right Ventricle: The right ventricular  size is normal. Right vetricular wall thickness was not assessed. Right ventricular systolic function is normal. Left Atrium: Left atrial size was not well visualized. Right  Atrium: Right atrial size was not well visualized. Pericardium: There is no evidence of pericardial effusion. Mitral Valve: The mitral valve is grossly normal. No evidence of mitral valve regurgitation. Tricuspid Valve: The tricuspid valve is not assessed. Tricuspid valve regurgitation not assessed. Aortic Valve: The aortic valve was not well visualized. Aortic valve regurgitation not assessed. Pulmonic Valve: The pulmonic valve was not well visualized. Pulmonic valve regurgitation not assessed. Aorta: The aortic root was not well visualized. Venous: The inferior vena cava was not well visualized. IAS/Shunts: The interatrial septum was not assessed.  LEFT VENTRICLE PLAX 2D LVIDd:         3.24 cm LVIDs:         2.39 cm LV PW:         1.43 cm LV IVS:        1.99 cm LVOT diam:     2.00 cm LVOT Area:     3.14 cm  LEFT ATRIUM         Index LA diam:    3.10 cm 1.06 cm/m   AORTA Ao Root diam: 2.90 cm  SHUNTS Systemic Diam: 2.00 cm Debbe Odea MD Electronically signed by Debbe Odea MD Signature Date/Time: 08/03/2020/3:51:25 PM    Final         Scheduled Meds: . apixaban  10 mg Oral BID   Followed by  . [START ON 08/11/2020] apixaban  5 mg Oral BID  . Chlorhexidine Gluconate Cloth  6 each Topical Daily  . dextromethorphan-guaiFENesin  1 tablet Oral BID  . famotidine  20 mg Oral Daily  . ipratropium-albuterol  3 mL Nebulization Q6H  . lisinopril  10 mg Oral Daily  . methylPREDNISolone (SOLU-MEDROL) injection  40 mg Intravenous Q12H  . metoprolol succinate  50 mg Oral Daily  . rosuvastatin  20 mg Oral Daily  . sodium chloride flush  3 mL Intravenous Q12H   Continuous Infusions: . sodium chloride    . cefTRIAXone (ROCEPHIN)  IV Stopped (08/04/20 0839)     LOS: 3 days    Time spent: 32 mins     Charise Killian, MD [facility] Pager [phone]  If 7PM-7AM, please contact night-coverage [email or web address] 08/05/2020, 8:16 AM

## 2020-08-05 NOTE — TOC Progression Note (Addendum)
Transition of Care [facility]) - Progression Note    Patient Details  Name: Thomas Tanner MRN: [id number] Date of Birth: 11-05-1951  [facility] CM/SW Contact  Eilleen Kempf, LCSW Phone Number: 08/05/2020, 4:37 PM  Clinical Narrative:    CSW confirmed with [facility] this patient is already active with them and will resume services at discharge      Expected Discharge Plan and Services                                                 Social Determinants of Health (SDOH) Interventions    Readmission Risk Interventions No flowsheet data found.

## 2020-08-06 LAB — CBC
HCT: 39 % (ref 39.0–52.0)
Hemoglobin: 13.6 g/dL (ref 13.0–17.0)
MCH: 32.9 pg (ref 26.0–34.0)
MCHC: 34.9 g/dL (ref 30.0–36.0)
MCV: 94.4 fL (ref 80.0–100.0)
Platelets: 294 10*3/uL (ref 150–400)
RBC: 4.13 MIL/uL — ABNORMAL LOW (ref 4.22–5.81)
RDW: 13.5 % (ref 11.5–15.5)
WBC: 10 10*3/uL (ref 4.0–10.5)
nRBC: 0 % (ref 0.0–0.2)

## 2020-08-06 LAB — BASIC METABOLIC PANEL
Anion gap: 9 (ref 5–15)
BUN: 25 mg/dL — ABNORMAL HIGH (ref 8–23)
CO2: 31 mmol/L (ref 22–32)
Calcium: 8.9 mg/dL (ref 8.9–10.3)
Chloride: 97 mmol/L — ABNORMAL LOW (ref 98–111)
Creatinine, Ser: 1.18 mg/dL (ref 0.61–1.24)
GFR calc Af Amer: >60 mL/min (ref >60)
GFR calc non Af Amer: >60 mL/min (ref >60)
Glucose, Bld: 131 mg/dL — ABNORMAL HIGH (ref 70–99)
Potassium: 5.3 mmol/L — ABNORMAL HIGH (ref 3.5–5.1)
Sodium: 137 mmol/L (ref 135–145)

## 2020-08-06 LAB — POTASSIUM: Potassium: 5.1 mmol/L (ref 3.5–5.1)

## 2020-08-06 MED ORDER — NICOTINE 14 MG/24HR TD PT24
14.0000 mg | MEDICATED_PATCH | Freq: Every day | TRANSDERMAL | Status: DC
  Administered 2020-08-06 – 2020-08-07 (×2): 14 mg via TRANSDERMAL
  Filled 2020-08-06 (×2): qty 1

## 2020-08-06 NOTE — Progress Notes (Signed)
PROGRESS NOTE    Thomas Tanner  DPO:[id number] DOB: 20-Dec-1950 DOA: 08/02/2020 PCP: [facility]   Assessment & Plan:   Active Problems:   Hypertension   CAD in native artery   Chronic diastolic CHF (congestive heart failure) (HCC)   Tobacco abuse   COPD with acute exacerbation (HCC)   Acute on chronic heart failure (HCC)   Acute saddle pulmonary embolism (HCC)    Acute on chronic hypoxic respiratory failure: secondary to large saddle pulmonary embolism w/ large clot burden. S/p thrombectomy 08/03/20 as per vascular surg. On 3L [place] 24-7 as per pt. Continue on supplemental oxygen and wean back to baseline as tolerated. Currently on 4L [place]  Pulmonary embolism: w/ large saddle emboli w/ large clot burden. S/p thrombectomy 08/03/20 as per vascular surg. D/C IV heparin and continue on eliquis. Concern of NOACs in morbidly obese pts but will try eliquis to see how pt responds   RLE DVT: of right popliteal vein. D/c IV heparin and continue eliquis.   COPD exacerbation: continue on IV solumedrol, azithromycin, & bronchodilators.  Encourage incentive spirometry   Hyperkalemia: repeat K ordered. Will continue to monitor   Leukocytosis: resolved  Chronic CHF: will hold lasix until pt receives treatment for pulmonary emboli. Likely diastolic. Echo shows EF >50%, no wall motion abnormalities, unable to determine diastolic function   CAD: hold aspirin and clopidogrel in setting of therapeutic anticoagulation. Last stent approx 2 years, will discuss need of aspirin & plavix in this setting w/ cardio   Hypertension: continue ACE-I and BB, keep MAP >65  Hyperlipidemia: continue on rosuvastatin   Morbid obesity: BMI 53.9. Would benefit from significant weight loss.   DVT prophylaxis: eliquis  Code Status: full  Family Communication:  Disposition Plan: likely d/c home w/ home health    Consultants:   Vascular surg    Procedures:   Contrast injection right  heart             2.  Thrombolysis with 4 mg of TPA in the left pulmonary arteries and 4 mg of TPA on the right pulmonary arteries             3.  Mechanical thrombectomy using the penumbra CAT 12 device to the left lower lobe and left upper lobe pulmonary arteries well as mechanical thrombectomy using the penumbra CAT 12 device to the right lower lobe, right middle lobe, and right upper lobe pulmonary arteries             4.  Selective catheter placement right lower lobe, right middle lobe, and right upper lobe pulmonary arteries             5.  Selective catheter placement left lower lobe and left upper lobe pulmonary artery                Antimicrobials:    Subjective: Pt c/o shortness of breath still   Objective: Vitals:   08/05/20 2000 08/06/20 0105 08/06/20 0502 08/06/20 0916  BP: (!) 102/47 117/62 119/66 101/62  Pulse: 93 90 87 100  Resp: 14 20 20 16   Temp: 98 F (36.7 C) 97.8 F (36.6 C) 98 F (36.7 C) 97.8 F (36.6 C)  TempSrc: Oral Oral Oral Oral  SpO2: 94% 96% 95% 95%  Weight:      Height:        Intake/Output Summary (Last 24 hours) at 08/06/2020 1152 Last data filed at 08/06/2020 1142 Gross per 24 hour  Intake 700  ml  Output 4025 ml  Net -3325 ml   Filed Weights   08/02/20 1230 08/03/20 0950  Weight: (!) 185.3 kg (!) 185.3 kg    Examination:   General exam:Appears calm but uncomfortable. Morbidly obese Respiratory system:diminished breath sounds b/l. Scattered wheezes Cardiovascular system:S1 &S2 +. No rubs, gallops or clicks.B/l LE edema Gastrointestinal system:Abdomen is obese, soft and nontender.Hypoactivebowel sounds heard. Central nervous system:Alert and oriented. Moves all 4 extremities Psychiatry:Judgement and insight appear normal. Flat mood and affect    Data Reviewed: I have personally reviewed following labs and imaging studies  CBC: Recent Labs  Lab 08/03/20 0634 08/03/20 2038 08/04/20 0019 08/05/20 0504 08/06/20 0516    WBC 11.5* 12.5* 10.6* 8.4 10.0  HGB 14.3 13.4 12.9* 13.1 13.6  HCT 42.6 38.3* 37.0* 38.4* 39.0  MCV 96.4 91.6 92.0 94.1 94.4  PLT 209 220 226 244 294   Basic Metabolic Panel: Recent Labs  Lab 08/02/20 1107 08/02/20 1305 08/03/20 0634 08/03/20 0634 08/03/20 2038 08/04/20 0019 08/05/20 0504 08/06/20 0516 08/06/20 1048  NA   < >  --  136  --  133* 134* 135 137  --   K   < >  --  4.6   < > 5.0 4.8 4.9 5.3* 5.1  CL   < >  --  95*  --  95* 98 97* 97*  --   CO2   < >  --  25  --  27 27 30 31   --   GLUCOSE   < >  --  128*  --  161* 167* 143* 131*  --   BUN   < >  --  22  --  26* 28* 27* 25*  --   CREATININE   < >  --  1.53*  --  1.46* 1.41* 1.20 1.18  --   CALCIUM   < >  --  9.1  --  8.3* 8.4* 8.6* 8.9  --   MG  --  2.1 2.3  --  2.3  --   --   --   --    < > = values in this interval not displayed.   GFR: Estimated Creatinine Clearance: 104.9 mL/min (by C-G formula based on SCr of 1.18 mg/dL). Liver Function Tests: No results for input(s): AST, ALT, ALKPHOS, BILITOT, PROT, ALBUMIN in the last 168 hours. No results for input(s): LIPASE, AMYLASE in the last 168 hours. No results for input(s): AMMONIA in the last 168 hours. Coagulation Profile: Recent Labs  Lab 08/02/20 1659  INR 1.1   Cardiac Enzymes: No results for input(s): CKTOTAL, CKMB, CKMBINDEX, TROPONINI in the last 168 hours. BNP (last 3 results) No results for input(s): PROBNP in the last 8760 hours. HbA1C: No results for input(s): HGBA1C in the last 72 hours. CBG: Recent Labs  Lab 08/03/20 0958 08/03/20 1208 08/03/20 1701  GLUCAP 110* 139* 161*   Lipid Profile: No results for input(s): CHOL, HDL, LDLCALC, TRIG, CHOLHDL, LDLDIRECT in the last 72 hours. Thyroid Function Tests: No results for input(s): TSH, T4TOTAL, FREET4, T3FREE, THYROIDAB in the last 72 hours. Anemia Panel: No results for input(s): VITAMINB12, FOLATE, FERRITIN, TIBC, IRON, RETICCTPCT in the last 72 hours. Sepsis Labs: No results for  input(s): PROCALCITON, LATICACIDVEN in the last 168 hours.  Recent Results (from the past 240 hour(s))  SARS Coronavirus 2 by RT PCR (hospital order, performed in [facility] hospital lab) Nasopharyngeal Nasopharyngeal Swab     Status: None   Collection Time:  08/02/20 12:12 PM   Specimen: Nasopharyngeal Swab  Result Value Ref Range Status   SARS Coronavirus 2 NEGATIVE NEGATIVE Final    Comment: (NOTE) SARS-CoV-2 target nucleic acids are NOT DETECTED.  The SARS-CoV-2 RNA is generally detectable in upper and lower respiratory specimens during the acute phase of infection. The lowest concentration of SARS-CoV-2 viral copies this assay can detect is 250 copies / mL. A negative result does not preclude SARS-CoV-2 infection and should not be used as the sole basis for treatment or other patient management decisions.  A negative result may occur with improper specimen collection / handling, submission of specimen other than nasopharyngeal swab, presence of viral mutation(s) within the areas targeted by this assay, and inadequate number of viral copies (<250 copies / mL). A negative result must be combined with clinical observations, patient history, and epidemiological information.  Fact Sheet for Patients:   BoilerBrush.com.cy  Fact Sheet for Healthcare Providers: https://pope.com/  This test is not yet approved or  cleared by the [place] FDA and has been authorized for detection and/or diagnosis of SARS-CoV-2 by FDA under an Emergency Use Authorization (EUA).  This EUA will remain in effect (meaning this test can be used) for the duration of the COVID-19 declaration under Section 564(b)(1) of the Act, 21 U.S.C. section 360bbb-3(b)(1), unless the authorization is terminated or revoked sooner.  Performed at [facility], [address] [place], [place] [address]   MRSA PCR Screening     Status: None   Collection Time:  08/03/20  4:58 PM   Specimen: Nasal Mucosa; Nasopharyngeal  Result Value Ref Range Status   MRSA by PCR NEGATIVE NEGATIVE Final    Comment:        The GeneXpert MRSA Assay (FDA approved for NASAL specimens only), is one component of a comprehensive MRSA colonization surveillance program. It is not intended to diagnose MRSA infection nor to guide or monitor treatment for MRSA infections. Performed at [facility], [address], [place], [place] [address]          Radiology Studies: No results found.      Scheduled Meds: . apixaban  10 mg Oral BID   Followed by  . [START ON 08/11/2020] apixaban  5 mg Oral BID  . Chlorhexidine Gluconate Cloth  6 each Topical Daily  . dextromethorphan-guaiFENesin  1 tablet Oral BID  . famotidine  20 mg Oral Daily  . ipratropium-albuterol  3 mL Nebulization Q6H  . methylPREDNISolone (SOLU-MEDROL) injection  40 mg Intravenous Q12H  . metoprolol succinate  50 mg Oral Daily  . nicotine  14 mg Transdermal Daily  . rosuvastatin  20 mg Oral Daily  . sodium chloride flush  3 mL Intravenous Q12H   Continuous Infusions: . sodium chloride    . cefTRIAXone (ROCEPHIN)  IV 1 g (08/05/20 0909)     LOS: 4 days    Time spent: 30 mins     Charise Killian, MD [facility] Pager [phone]  If 7PM-7AM, please contact night-coverage [email or web address] 08/06/2020, 11:52 AM

## 2020-08-06 NOTE — TOC Progression Note (Signed)
Transition of Care [facility]) - Progression Note    Patient Details  Name: Thomas Tanner MRN: [id number] Date of Birth: 06-Apr-1951  [facility] CM/SW Contact  Ashley Royalty Lutricia Feil, RN Phone Number: 08/06/2020, 2:01 PM  Clinical Narrative:     Per MD: Pt c/o shortness of breath still. Pt already set up with [facility]) to resume previous services.   TOC team will continue to follow.        Expected Discharge Plan and Services                                                 Social Determinants of Health (SDOH) Interventions    Readmission Risk Interventions No flowsheet data found.

## 2020-08-06 NOTE — Progress Notes (Signed)
Cross Cover Note Potassium 5.. lisinopril discontinued.

## 2020-08-07 LAB — BASIC METABOLIC PANEL
Anion gap: 11 (ref 5–15)
BUN: 25 mg/dL — ABNORMAL HIGH (ref 8–23)
CO2: 29 mmol/L (ref 22–32)
Calcium: 8.9 mg/dL (ref 8.9–10.3)
Chloride: 95 mmol/L — ABNORMAL LOW (ref 98–111)
Creatinine, Ser: 1.04 mg/dL (ref 0.61–1.24)
GFR calc Af Amer: >60 mL/min (ref >60)
GFR calc non Af Amer: >60 mL/min (ref >60)
Glucose, Bld: 130 mg/dL — ABNORMAL HIGH (ref 70–99)
Potassium: 5 mmol/L (ref 3.5–5.1)
Sodium: 135 mmol/L (ref 135–145)

## 2020-08-07 LAB — CBC
HCT: 40.2 % (ref 39.0–52.0)
Hemoglobin: 13.8 g/dL (ref 13.0–17.0)
MCH: 32.4 pg (ref 26.0–34.0)
MCHC: 34.3 g/dL (ref 30.0–36.0)
MCV: 94.4 fL (ref 80.0–100.0)
Platelets: 289 10*3/uL (ref 150–400)
RBC: 4.26 MIL/uL (ref 4.22–5.81)
RDW: 13.5 % (ref 11.5–15.5)
WBC: 9.5 10*3/uL (ref 4.0–10.5)
nRBC: 0 % (ref 0.0–0.2)

## 2020-08-07 MED ORDER — AZITHROMYCIN 250 MG PO TABS: ORAL_TABLET | ORAL | 0 refills | Status: DC

## 2020-08-07 MED ORDER — APIXABAN 5 MG PO TABS: 10.0000 mg | ORAL_TABLET | Freq: Two times a day (BID) | ORAL | 0 refills | Status: DC

## 2020-08-07 MED ORDER — PREDNISONE 20 MG PO TABS: 40.0000 mg | ORAL_TABLET | Freq: Every day | ORAL | 0 refills | Status: AC

## 2020-08-07 MED ORDER — APIXABAN 5 MG PO TABS: 5.0000 mg | ORAL_TABLET | Freq: Two times a day (BID) | ORAL | 0 refills | Status: DC

## 2020-08-07 NOTE — Discharge Summary (Signed)
Physician Discharge Summary  Thomas Tanner WUJ:[id number] DOB: 1951/04/27 DOA: 08/02/2020  PCP: [facility]  Admit date: 08/02/2020 Discharge date: 08/07/2020  Admitted From: home Disposition: home w/ home health   Recommendations for Outpatient Follow-up:  1. Follow up with PCP in 1-2 weeks 2. F/u vascular surgery, Dr. Wyn Quaker, in 1 week 3. F/u cardio, Dr. Juliann Pares, in 1 week  Home Health: yes Equipment/Devices: chronically on oxygen 3L [place]  Discharge Condition: stable  CODE STATUS: full  Diet recommendation: Heart Healthy / Carb Modified   Brief/Interim Summary: HPI was taken from Dr. Crissie Reese: Thomas Tanner is a 69 y.o. male with history of COPD on 3 L home O2, chronic diastolic CHF, hypertension, CAD, tobacco use, who presents with worsening shortness of breath.  Patient has been admitted twice already this month.  Admitted from August 1 August 9 for combined CHF and COPD exacerbations.  He was readmitted from August 11 August 13 for hypotension and AKI thought to be secondary to dehydration from diuretics and NSAID use.  BNP during last admission was 40.  Today patient presents with worsening shortness of breath.  He reports no missed medication since he was last discharged, and he specifically notes that he has not had any dietary indiscretions with regard to salt.  He does not believe he has been sick lately, his only symptom that he has noticed has been a worsening shortness of breath.  He denies any chest pain now or recently.  In the ED vital signs notable for mild tachycardia in the low 100s on arrival and tachypnea into the 20s.  As well as hypoxia down into the 80s that required high flow nasal cannula ultimately left at 8 L/min high flow nasal cannula.  Lab work-up notable for BMP with creatinine at baseline at 1.5, CBC unremarkable, BNP significantly elevated at 503 compared to 40 during last admission, elevated troponin at 43 that down  trended to 35 over 2 hours.  Chest x-ray showed pulmonary vascular congestion.  Initial treatment focused on presumed diagnosis of heart failure exacerbation as well as COPD exacerbation, and he was given nebulizer treatments and IV Lasix.  Due to high oxygen requirements patient was ordered for CTPA which subsequently showed multiple large pulmonary emboli with evidence of right heart strain and he was started on heparin drip.  He was admitted for further management of pulmonary embolism  Hospital Course from Dr. Shela Commons. [name] 8/25-8/29/21: Pt presented w/ shortness of breath secondary to a large saddle pulmonary embolism w/ large clot burden. Pt had  a thrombectomy by vascular surgery on 08/03/20. Pt was initially put on IV heparin but then transitioned to po eliquis. Of note, pt was also found to have RLE DVT. Pt will f/u w/ vascular surg, Dr. Wyn Quaker, in 1 week. Also, pt was previously on aspirin & plavix for cardiac stents but those medications are being held while pt is on eliquis at least for 3 months, potentially longer. This was discussed w/ the pt's cardiologist, Dr. Juliann Pares. Finally, PT/OT saw pt and recommended home health but pt initially refused home health but then agreed to it on day of d/c. For more information please see other progress notes.   Discharge Diagnoses:  Active Problems:   Hypertension   CAD in native artery   Chronic diastolic CHF (congestive heart failure) (HCC)   Tobacco abuse   COPD with acute exacerbation (HCC)   Acute on chronic heart failure (HCC)   Acute saddle pulmonary embolism (HCC)  Acute on chronic hypoxic respiratory failure: secondary to large saddle pulmonary embolism w/ large clot burden. S/p thrombectomy 08/03/20 as per vascular surg. On 3L [place] 24-7 as per pt. Continue on supplemental oxygen and back to baseline oxygen level of 3L [place]  Pulmonary embolism: w/ large saddle emboli w/ large clot burden. S/p thrombectomy 08/03/20 as per vascular surg. D/C IV  heparin and continue on eliquis. Concern of NOACs in morbidly obese pts but will try eliquis to see how pt responds   RLE DVT: of right popliteal vein. D/c IV heparin and continue eliquis.   COPD exacerbation: continue on IV solumedrol, azithromycin, & bronchodilators.  Encourage incentive spirometry   Hyperkalemia: repeat K ordered. Will continue to monitor   Leukocytosis: resolved  Chronic CHF: will restart lasix at d/c. Likely diastolic. Echo shows EF >50%, no wall motion abnormalities, unable to determine diastolic function   CAD: hold aspirin and clopidogrel in setting of therapeutic anticoagulation. Last stent approx 2 years, no aspirin & plavix while taking eliquis & this was discussed w/ pt's cardiologist, Dr. Juliann Pares   Hypertension: continue ACE-I and BB, keep MAP >65  Hyperlipidemia: continue on rosuvastatin   Morbid obesity: BMI 53.9. Would benefit from significant weight loss  Discharge Instructions  Discharge Instructions    Diet - low sodium heart healthy   Complete by: As directed    Discharge instructions   Complete by: As directed    F/u cardio, Dr. Juliann Pares, in 3-4 weeks. F/u vascular surgery, Dr. Wyn Quaker, in 1 month. F/u PCP in 1-2 weeks. You need to take eliquis for at least 3 months, maybe longer. I only gave you script for 1 month of eliquis, after then you need to get refills from your PCP or cardiologist. Do not take aspirin, plavix/clopidogrel, meloxicam or any other non-steroidal antiinflammatory drugs/NSAIDs while you are taking eliquis.   Increase activity slowly   Complete by: As directed      Allergies as of 08/07/2020      Reactions   Morphine Anxiety, Other (See Comments)   Hallucinations   Morphine And Related Anxiety      Medication List    STOP taking these medications   aspirin 81 MG chewable tablet   clopidogrel 75 MG tablet Commonly known as: PLAVIX   meloxicam 7.5 MG tablet Commonly known as: MOBIC     TAKE these  medications   apixaban 5 MG Tabs tablet Commonly known as: ELIQUIS Take 2 tablets (10 mg total) by mouth 2 (two) times daily for 3 days.   apixaban 5 MG Tabs tablet Commonly known as: ELIQUIS Take 1 tablet (5 mg total) by mouth 2 (two) times daily. Start taking on: August 11, 2020   azithromycin 250 MG tablet Commonly known as: Zithromax Z-Pak Take 1 tablet daily for 2 days   famotidine 20 MG tablet Commonly known as: PEPCID Take 1 tablet (20 mg total) by mouth 2 (two) times daily. What changed: when to take this   furosemide 20 MG tablet Commonly known as: LASIX Take 1 tablet (20 mg total) by mouth daily.   lisinopril 10 MG tablet Commonly known as: ZESTRIL Take 1 tablet (10 mg total) by mouth daily.   metoprolol succinate 50 MG 24 hr tablet Commonly known as: TOPROL-XL Take 1 tablet (50 mg total) by mouth daily.   nicotine 21 mg/24hr patch Commonly known as: NICODERM CQ - dosed in mg/24 hours Place 1 patch (21 mg total) onto the skin daily.   Omega-3 500 MG  Caps Take 1 tab po daily What changed:   how much to take  how to take this  when to take this  additional instructions   predniSONE 20 MG tablet Commonly known as: Deltasone Take 2 tablets (40 mg total) by mouth daily for 5 days.   rosuvastatin 20 MG tablet Commonly known as: CRESTOR Take 1 tablet (20 mg total) by mouth daily.   traMADol 50 MG tablet Commonly known as: ULTRAM Take 1 tablet (50 mg total) by mouth every 8 (eight) hours as needed. What changed: reasons to take this   Ventolin HFA 108 (90 Base) MCG/ACT inhaler Generic drug: albuterol Inhale 2 puffs into the lungs every 6 (six) hours as needed for wheezing or shortness of breath.       Follow-up Information    [facility] Follow up on 08/11/2020.   Specialty: Cardiology Why: at 3:00pm. Enter through the [facility] entrance Contact information: [address] [place] [address] [phone]       Annice Needy, MD Follow up in 1 week(s).   Specialties: Vascular Surgery, Radiology, Interventional Cardiology Why: Can see Dew or Vivia Birmingham.  Seen as a consult.  Will need right lower extremity DVT study with visit. Contact information: [address] [place] [address] [phone]        [facility] Follow up in 2 week(s).   Specialty: Pulmonary Disease Contact information: [address] [place] [address] [phone]        Dorothyann Peng D, MD Follow up in 3 week(s).   Specialties: Cardiology, Internal Medicine Contact information: [address] [place] [address] [phone]              Allergies  Allergen Reactions  . Morphine Anxiety and Other (See Comments)    Hallucinations  . Morphine And Related Anxiety    Consultations:  Vascular surg  Cardio     Procedures/Studies: DG Chest 1 View  Result Date: 08/03/2020 CLINICAL DATA:  Ventricular ectopy EXAM: CHEST  1 VIEW COMPARISON:  08/02/2020 FINDINGS: Cardiac shadow is enlarged but stable. Aortic calcifications are again seen. Lungs are well aerated bilaterally without focal infiltrate. Increased bronchial markings are again identified similar to that seen on previous exams. No focal confluent infiltrate is noted. No sizable effusion is seen. No bony abnormality is noted. IMPRESSION: Stable appearance of the chest when compared with the previous day. Electronically Signed   By: Alcide Clever M.D.   On: 08/03/2020 21:15   DG Chest 1 View  Result Date: 07/15/2020 CLINICAL DATA:  69 year old male with shortness of breath.  Smoker. EXAM: CHEST  1 VIEW COMPARISON:  Portable chest 07/10/2020 and earlier. FINDINGS: Portable AP upright view at 0745 hours. Stable lung volumes and mediastinal contours. Visualized tracheal air column is within normal limits. No pneumothorax, pleural effusion or consolidation. Widespread  increased pulmonary interstitium, which appears mostly chronic when compared to radiographs back to 2014. Visualized tracheal air column is within normal limits. Stable visualized osseous structures. IMPRESSION: Chronic pulmonary interstitial changes. No acute cardiopulmonary abnormality. Electronically Signed   By: Odessa Fleming M.D.   On: 07/15/2020 07:55   DG Chest 2 View  Result Date: 07/20/2020 CLINICAL DATA:  Hypertension EXAM: CHEST - 2 VIEW COMPARISON:  July 15, 2020 FINDINGS: The cardiomediastinal silhouette is unchanged in contour.Unchanged diffuse interstitial prominence. Mild peribronchial cuffing. No pleural effusion. No pneumothorax. No acute pleuroparenchymal abnormality. Visualized abdomen is unremarkable.  Multilevel degenerative changes of the thoracic spine. IMPRESSION: No acute cardiopulmonary abnormality. Unchanged diffuse interstitial prominence and peribronchial cuffing which could reflect underlying mild pulmonary edema. Electronically Signed   By: Meda Klinefelter MD   On: 07/20/2020 12:49   CT Angio Chest PE W and/or Wo Contrast  Result Date: 08/02/2020 CLINICAL DATA:  PE suspected, high prob Shortness of breath and wheezing.  CHF diagnosis 6 months ago. EXAM: CT ANGIOGRAPHY CHEST WITH CONTRAST TECHNIQUE: Multidetector CT imaging of the chest was performed using the standard protocol during bolus administration of intravenous contrast. Multiplanar CT image reconstructions and MIPs were obtained to evaluate the vascular anatomy. CONTRAST:  75mL OMNIPAQUE IOHEXOL 350 MG/ML SOLN COMPARISON:  Chest CT 05/24/2020. radiograph earlier this day FINDINGS: Cardiovascular: Examination is positive for pulmonary embolus with moderate saddle thrombus and filling defects extending into all lobar and many segmental and subsegmental branches. Thromboembolic burden is large. There is straightening of the intraventricular septum with elevated RV to LV ratio of 2.04. There are coronary artery  calcifications/stents. Aortic atherosclerosis without dissection. Conventional branching pattern from the aortic arch. No pericardial effusion. Mediastinum/Nodes: No mediastinal or hilar adenopathy. Heterogeneous thyroid gland just partially obscured on the left by dense contrast administration in the adjacent axillary vein. No esophageal wall thickening. Lungs/Pleura: No pulmonary infarct or focal airspace disease. No findings of pulmonary edema or pleural effusion. There is mild central bronchial thickening. 5 mm perifissural nodule in the right upper lobe is likely an intrapulmonary lymph node, unchanged from recent prior, series 6, image 45. Upper Abdomen: No acute or unexpected finding. Musculoskeletal: Remote right rib fractures with nonunion. There are no acute or suspicious osseous abnormalities. Review of the MIP images confirms the above findings. IMPRESSION: 1. Examination is positive for pulmonary embolus with moderate saddle thrombus and filling defects extending into all lobar and many segmental and subsegmental branches. Thromboembolic burden is large. There is CT findings of right heart strain (RV/LV Ratio = 2.04) consistent with at least submassive (intermediate risk) PE. The presence of right heart strain has been associated with an increased risk of morbidity and mortality. 2. Mild central bronchial thickening, can be seen with bronchitis or reactive airways disease. 3. Coronary artery calcifications/stents. 4. 5 mm perifissural nodule in the right upper lobe is likely an intrapulmonary lymph node but nonspecific. No follow-up needed if patient is low-risk. Non-contrast chest CT can be considered in 12 months if patient is high-risk. This recommendation follows the consensus statement: Guidelines for Management of Incidental Pulmonary Nodules Detected on CT Images: From the Fleischner Society 2017; Radiology 2017; 284:228-243. Aortic Atherosclerosis (ICD10-I70.0). Critical Value/emergent results  were called by telephone at the time of interpretation on 08/02/2020 at 4:58 pm to Dr Antoine Primas , who verbally acknowledged these results. Electronically Signed   By: Narda Rutherford M.D.   On: 08/02/2020 16:58   PERIPHERAL VASCULAR CATHETERIZATION  Result Date: 08/03/2020 See op note  US Venous Img Lower Bilateral (DVT)  Result Date: 08/03/2020 CLINICAL DATA:  Leg pain and swelling, history of pulmonary embolism EXAM: BILATERAL LOWER EXTREMITY VENOUS DOPPLER ULTRASOUND TECHNIQUE: Gray-scale sonography with graded compression, as well as color Doppler and duplex ultrasound were performed to evaluate the lower extremity deep venous systems from the level of the common femoral vein and including the common femoral, femoral, profunda femoral, popliteal and calf veins including the posterior tibial, peroneal and gastrocnemius veins when visible. The superficial great saphenous vein was also interrogated. Spectral Doppler was utilized to evaluate flow at rest and  with distal augmentation maneuvers in the common femoral, femoral and popliteal veins. COMPARISON:  None. FINDINGS: RIGHT LOWER EXTREMITY Common Femoral Vein: No evidence of thrombus. Normal compressibility, respiratory phasicity and response to augmentation. Saphenofemoral Junction: No evidence of thrombus. Normal compressibility and flow on color Doppler imaging. Profunda Femoral Vein: No evidence of thrombus. Normal compressibility and flow on color Doppler imaging. Femoral Vein: No evidence of thrombus. Normal compressibility, respiratory phasicity and response to augmentation. Popliteal Vein: Thrombus is noted the popliteal vein with decreased compressibility. Calf Veins: Calf veins are not well visualized. Superficial Great Saphenous Vein: No evidence of thrombus. Normal compressibility. Venous Reflux:  None. Other Findings:  None. LEFT LOWER EXTREMITY Common Femoral Vein: No evidence of thrombus. Normal compressibility, respiratory phasicity  and response to augmentation. Saphenofemoral Junction: No evidence of thrombus. Normal compressibility and flow on color Doppler imaging. Profunda Femoral Vein: No evidence of thrombus. Normal compressibility and flow on color Doppler imaging. Femoral Vein: No evidence of thrombus. Normal compressibility, respiratory phasicity and response to augmentation. Popliteal Vein: No evidence of thrombus. Normal compressibility, respiratory phasicity and response to augmentation. Calf Veins: Not well visualized. Superficial Great Saphenous Vein: No evidence of thrombus. Normal compressibility. Venous Reflux:  None. Other Findings:  None. IMPRESSION: Right popliteal vein deep venous thrombosis. No other focal abnormality is noted. Electronically Signed   By: Alcide Clever M.D.   On: 08/03/2020 15:58   DG Chest Port 1 View  Result Date: 08/02/2020 CLINICAL DATA:  Shortness of breath. EXAM: PORTABLE CHEST 1 VIEW COMPARISON:  July 20, 2020. FINDINGS: Mild cardiomegaly is noted with mild central pulmonary vascular congestion. No pneumothorax or pleural effusion is noted. Both lungs are clear. The visualized skeletal structures are unremarkable. IMPRESSION: Mild cardiomegaly with mild central pulmonary vascular congestion. Electronically Signed   By: Lupita Raider M.D.   On: 08/02/2020 12:22   DG Chest Port 1 View  Result Date: 07/10/2020 CLINICAL DATA:  Shortness of breath, CHF EXAM: PORTABLE CHEST 1 VIEW COMPARISON:  Radiograph 07/02/2020, CT 05/24/2020 FINDINGS: Mild central vascular congestion with diffuse Lea increased interstitial prominence similar to comparison studies accounting for differences in technique and inflation. No focally consolidative opacity. No pneumothorax or effusion. Cardiac silhouette is likely accentuated by portable technique. Overall, cardiomediastinal contours are similar to prior. No acute osseous or soft tissue abnormality. Telemetry leads overlie the chest. IMPRESSION: Vascular  congestion with some increased interstitial markings, may reflect early interstitial edema/features of CHF. Electronically Signed   By: Kreg Shropshire M.D.   On: 07/10/2020 19:18   ECHOCARDIOGRAM COMPLETE  Result Date: 08/03/2020    ECHOCARDIOGRAM REPORT   Patient Name:   MONTRELLE EDDINGS Date of Exam: 08/03/2020 Medical Rec #:  [id number]                Height:       73.0 in Accession #:    [id number]               Weight:       408.5 lb Date of Birth:  23-Sep-1951                 BSA:          2.915 m Patient Age:    69 years                 BP:           99/53 mmHg Patient Gender: M  HR:           94 bpm. Exam Location:  [facility] Procedure: 2D Echo, Cardiac Doppler and Color Doppler Indications:     Pulmonary embolus 415.19  History:         Patient has prior history of Echocardiogram examinations, most                  recent 05/25/2020. TIA and COPD; Risk Factors:Hypertension and                  Dyslipidemia.  Sonographer:     Cristela Blue RDCS (AE) Referring Phys:  [id number] Mary Sella ECKSTAT Diagnosing Phys: Debbe Odea MD  Sonographer Comments: Technically difficult study due to poor echo windows, no apical window and no subcostal window. IMPRESSIONS  1. Imaging quality prevents adequate assessment for wall motion abnormalities, wall thickness. recommends repeat echo with contrast study if clinically warranted.. Left ventricular ejection fraction, by estimation, is >50%. The left ventricle has normal  function. The left ventricle has no regional wall motion abnormalities. There is unable to assess due to image quality left ventricular hypertrophy. Left ventricular diastolic function could not be evaluated.  2. Right ventricular systolic function is normal. The right ventricular size is normal.  3. The mitral valve is grossly normal. No evidence of mitral valve regurgitation.  4. Tricuspid valve regurgitation not assessed.  5. The aortic valve was not well visualized. Aortic valve  regurgitation not assessed.  6. Pulmonic valve regurgitation not assessed. FINDINGS  Left Ventricle: Imaging quality prevents adequate assessment for wall motion abnormalities, wall thickness. recommends repeat echo with contrast study if clinically warranted. Left ventricular ejection fraction, by estimation, is >50%. The left ventricle has normal function. The left ventricle has no regional wall motion abnormalities. The left ventricular internal cavity size was normal in size. There is unable to assess due to image quality left ventricular hypertrophy. Left ventricular diastolic function could not be evaluated. Right Ventricle: The right ventricular size is normal. Right vetricular wall thickness was not assessed. Right ventricular systolic function is normal. Left Atrium: Left atrial size was not well visualized. Right Atrium: Right atrial size was not well visualized. Pericardium: There is no evidence of pericardial effusion. Mitral Valve: The mitral valve is grossly normal. No evidence of mitral valve regurgitation. Tricuspid Valve: The tricuspid valve is not assessed. Tricuspid valve regurgitation not assessed. Aortic Valve: The aortic valve was not well visualized. Aortic valve regurgitation not assessed. Pulmonic Valve: The pulmonic valve was not well visualized. Pulmonic valve regurgitation not assessed. Aorta: The aortic root was not well visualized. Venous: The inferior vena cava was not well visualized. IAS/Shunts: The interatrial septum was not assessed.  LEFT VENTRICLE PLAX 2D LVIDd:         3.24 cm LVIDs:         2.39 cm LV PW:         1.43 cm LV IVS:        1.99 cm LVOT diam:     2.00 cm LVOT Area:     3.14 cm  LEFT ATRIUM         Index LA diam:    3.10 cm 1.06 cm/m   AORTA Ao Root diam: 2.90 cm  SHUNTS Systemic Diam: 2.00 cm Debbe Odea MD Electronically signed by Debbe Odea MD Signature Date/Time: 08/03/2020/3:51:25 PM    Final        Subjective: Pt c/o fatigue    Discharge  Exam: Vitals:   08/06/20  2349 08/07/20 0732  BP: 121/67 138/63  Pulse: 82 77  Resp: 20 17  Temp: 97.9 F (36.6 C) 98.6 F (37 C)  SpO2: 96% 99%   Vitals:   08/06/20 1944 08/06/20 2349 08/07/20 0156 08/07/20 0732  BP:  121/67  138/63  Pulse:  82  77  Resp:  20  17  Temp:  97.9 F (36.6 C)  98.6 F (37 C)  TempSrc:  Oral  Oral  SpO2: 95% 96%  99%  Weight:   (!) 190.8 kg   Height:        General: Pt is alert, awake, not in acute distress Cardiovascular: S1/S2 +, no rubs, no gallops Respiratory: diminished breath sounds b/l otherwise cleawr Abdominal: Soft, NT, obese, bowel sounds + Extremities: no cyanosis    The results of significant diagnostics from this hospitalization (including imaging, microbiology, ancillary and laboratory) are listed below for reference.     Microbiology: Recent Results (from the past 240 hour(s))  SARS Coronavirus 2 by RT PCR (hospital order, performed [facility] lab) Nasopharyngeal Nasopharyngeal Swab     Status: None   Collection Time: 08/02/20 12:12 PM   Specimen: Nasopharyngeal Swab  Result Value Ref Range Status   SARS Coronavirus 2 NEGATIVE NEGATIVE Final    Comment: (NOTE) SARS-CoV-2 target nucleic acids are NOT DETECTED.  The SARS-CoV-2 RNA is generally detectable in upper and lower respiratory specimens during the acute phase of infection. The lowest concentration of SARS-CoV-2 viral copies this assay can detect is 250 copies / mL. A negative result does not preclude SARS-CoV-2 infection and should not be used as the sole basis for treatment or other patient management decisions.  A negative result may occur with improper specimen collection / handling, submission of specimen other than nasopharyngeal swab, presence of viral mutation(s) within the areas targeted by this assay, and inadequate number of viral copies (<250 copies / mL). A negative result must be combined with clinical observations, patient history, and  epidemiological information.  Fact Sheet for Patients:   BoilerBrush.com.cyhttps://www.fda.gov/media/136312/download  Fact Sheet for Healthcare Providers: https://pope.com/https://www.fda.gov/media/136313/download  This test is not yet approved or  cleared by the [place] FDA and has been authorized for detection and/or diagnosis of SARS-CoV-2 by FDA under an Emergency Use Authorization (EUA).  This EUA will remain in effect (meaning this test can be used) for the duration of the COVID-19 declaration under Section 564(b)(1) of the Act, 21 U.S.C. section 360bbb-3(b)(1), unless the authorization is terminated or revoked sooner.  Performed at [facility], [address] [place], [place] [id number]   MRSA PCR Screening     Status: None   Collection Time: 08/03/20  4:58 PM   Specimen: Nasal Mucosa; Nasopharyngeal  Result Value Ref Range Status   MRSA by PCR NEGATIVE NEGATIVE Final    Comment:        The GeneXpert MRSA Assay (FDA approved for NASAL specimens only), is one component of a comprehensive MRSA colonization surveillance program. It is not intended to diagnose MRSA infection nor to guide or monitor treatment for MRSA infections. Performed at [facility], [address] [place], [place] [id number]      Labs: BNP (last 3 results) Recent Labs    07/20/20 1155 08/02/20 1107 08/03/20 2038  BNP 40.6 503.4* 218.8*   Basic Metabolic Panel: Recent Labs  Lab 08/02/20 1107 08/02/20 1305 08/03/20 [id number] 08/03/20 [id number] 08/03/20 2038 08/03/20 2038 08/04/20 0019 08/05/20 [id number] 08/06/20 0516 08/06/20 1048 08/07/20 0542  NA   < >  --  136   < > 133*  --  134* 135 137  --  135  K   < >  --  4.6   < > 5.0   < > 4.8 4.9 5.3* 5.1 5.0  CL   < >  --  95*   < > 95*  --  98 97* 97*  --  95*  CO2   < >  --  25   < > 27  --  27 30 31   --  29  GLUCOSE   < >  --  128*   < > 161*  --  167* 143* 131*  --  130*  BUN   < >  --  22   < > 26*  --  28* 27* 25*  --  25*  CREATININE   < >  --   1.53*   < > 1.46*  --  1.41* 1.20 1.18  --  1.04  CALCIUM   < >  --  9.1   < > 8.3*  --  8.4* 8.6* 8.9  --  8.9  MG  --  2.1 2.3  --  2.3  --   --   --   --   --   --    < > = values in this interval not displayed.   Liver Function Tests: No results for input(s): AST, ALT, ALKPHOS, BILITOT, PROT, ALBUMIN in the last 168 hours. No results for input(s): LIPASE, AMYLASE in the last 168 hours. No results for input(s): AMMONIA in the last 168 hours. CBC: Recent Labs  Lab 08/03/20 2038 08/04/20 0019 08/05/20 0504 08/06/20 0516 08/07/20 0542  WBC 12.5* 10.6* 8.4 10.0 9.5  HGB 13.4 12.9* 13.1 13.6 13.8  HCT 38.3* 37.0* 38.4* 39.0 40.2  MCV 91.6 92.0 94.1 94.4 94.4  PLT 220 226 244 294 289   Cardiac Enzymes: No results for input(s): CKTOTAL, CKMB, CKMBINDEX, TROPONINI in the last 168 hours. BNP: Invalid input(s): POCBNP CBG: Recent Labs  Lab 08/03/20 0958 08/03/20 1208 08/03/20 1701  GLUCAP 110* 139* 161*   D-Dimer No results for input(s): DDIMER in the last 72 hours. Hgb A1c No results for input(s): HGBA1C in the last 72 hours. Lipid Profile No results for input(s): CHOL, HDL, LDLCALC, TRIG, CHOLHDL, LDLDIRECT in the last 72 hours. Thyroid function studies No results for input(s): TSH, T4TOTAL, T3FREE, THYROIDAB in the last 72 hours.  Invalid input(s): FREET3 Anemia work up No results for input(s): VITAMINB12, FOLATE, FERRITIN, TIBC, IRON, RETICCTPCT in the last 72 hours. Urinalysis    Component Value Date/Time   COLORURINE AMBER (A) 07/20/2020 2100   APPEARANCEUR CLOUDY (A) 07/20/2020 2100   APPEARANCEUR Clear 06/10/2013 1212   LABSPEC 1.019 07/20/2020 2100   LABSPEC 1.019 06/10/2013 1212   PHURINE 5.0 07/20/2020 2100   GLUCOSEU NEGATIVE 07/20/2020 2100   GLUCOSEU Negative 06/10/2013 1212   HGBUR NEGATIVE 07/20/2020 2100   BILIRUBINUR NEGATIVE 07/20/2020 2100   BILIRUBINUR Negative 06/10/2013 1212   KETONESUR NEGATIVE 07/20/2020 2100   PROTEINUR 30 (A)  07/20/2020 2100   NITRITE NEGATIVE 07/20/2020 2100   LEUKOCYTESUR SMALL (A) 07/20/2020 2100   LEUKOCYTESUR Negative 06/10/2013 1212   Sepsis Labs Invalid input(s): PROCALCITONIN,  WBC,  LACTICIDVEN Microbiology Recent Results (from the past 240 hour(s))  SARS Coronavirus 2 by RT PCR (hospital order, performed in [facility] hospital lab) Nasopharyngeal Nasopharyngeal Swab     Status: None  Collection Time: 08/02/20 12:12 PM   Specimen: Nasopharyngeal Swab  Result Value Ref Range Status   SARS Coronavirus 2 NEGATIVE NEGATIVE Final    Comment: (NOTE) SARS-CoV-2 target nucleic acids are NOT DETECTED.  The SARS-CoV-2 RNA is generally detectable in upper and lower respiratory specimens during the acute phase of infection. The lowest concentration of SARS-CoV-2 viral copies this assay can detect is 250 copies / mL. A negative result does not preclude SARS-CoV-2 infection and should not be used as the sole basis for treatment or other patient management decisions.  A negative result may occur with improper specimen collection / handling, submission of specimen other than nasopharyngeal swab, presence of viral mutation(s) within the areas targeted by this assay, and inadequate number of viral copies (<250 copies / mL). A negative result must be combined with clinical observations, patient history, and epidemiological information.  Fact Sheet for Patients:   BoilerBrush.com.cy  Fact Sheet for Healthcare Providers: https://pope.com/  This test is not yet approved or  cleared by the [place] FDA and has been authorized for detection and/or diagnosis of SARS-CoV-2 by FDA under an Emergency Use Authorization (EUA).  This EUA will remain in effect (meaning this test can be used) for the duration of the COVID-19 declaration under Section 564(b)(1) of the Act, 21 U.S.C. section 360bbb-3(b)(1), unless the authorization is terminated  or revoked sooner.  Performed at [facility], [address] [place], [place] [address]   MRSA PCR Screening     Status: None   Collection Time: 08/03/20  4:58 PM   Specimen: Nasal Mucosa; Nasopharyngeal  Result Value Ref Range Status   MRSA by PCR NEGATIVE NEGATIVE Final    Comment:        The GeneXpert MRSA Assay (FDA approved for NASAL specimens only), is one component of a comprehensive MRSA colonization surveillance program. It is not intended to diagnose MRSA infection nor to guide or monitor treatment for MRSA infections. Performed at [facility], [address], [place], [place] [address]      Time coordinating discharge: Over 30 minutes  SIGNED:   Charise Killian, MD  [facility] 08/07/2020, 11:28 AM Pager   If 7PM-7AM, please contact night-coverage [email or web address]

## 2020-08-07 NOTE — TOC Transition Note (Signed)
Transition of Care [facility]) - CM/SW Discharge Note   Patient Details  Name: Thomas Tanner MRN: [id number] Date of Birth: 11-19-1951  Transition of Care [facility]) CM/SW Contact:  Luvenia Redden, RN Phone Number: 08/07/2020, 11:26 AM   Clinical Narrative:     RN received notice that pt needs Eliquis coupon and HHPT for discharge today. RN spoke with pt at bedside and provided education and coupon for 30 days Eliquis at his local pharmacy. Also contact previous HHPT agency to resume services via [facility] spoke with [facility]. No other needs as pt states he will contact his son for transport once he is dress and ready to "go". No other needs presented at this time.   TOC available if further assistance needed.    Final next level of care: Home w [facility] Health Services ([facility]) Barriers to Discharge: No Barriers Identified   Patient Goals and CMS Choice     Choice offered to / list presented to : Patient (Resume prior HHPT with [facility] of choice [facility])  Discharge Placement                  Name of family member notified:  (Pt contacted son for transport home today) Patient and family notified of of transfer: 08/07/20  Discharge Plan and Services                          HH Arranged: PT [facility] Agency: [facility] Date HH Agency Contacted: 08/07/20 Time HH Agency Contacted: 1030 Representative spoke with at [facility] Agency: Melissa  Social Determinants of Health (SDOH) Interventions     Readmission Risk Interventions No flowsheet data found.

## 2020-08-07 NOTE — Progress Notes (Signed)
Patient is being discharged home this afternoon. [facility] has been resumed. DC & Rx instructions given and patient acknowledged understanding. Nurse answered all the questions pt had. IV's removed.  Belonging returned and patient asked to dress himself and declined help. Son will be called to pick up pt and he will bring his O2.

## 2020-08-08 ENCOUNTER — Telehealth: Payer: Self-pay

## 2020-08-08 DIAGNOSIS — I2510 Atherosclerotic heart disease of native coronary artery without angina pectoris: Secondary | ICD-10-CM | POA: Diagnosis not present

## 2020-08-08 DIAGNOSIS — E1122 Type 2 diabetes mellitus with diabetic chronic kidney disease: Secondary | ICD-10-CM | POA: Diagnosis not present

## 2020-08-08 DIAGNOSIS — N179 Acute kidney failure, unspecified: Secondary | ICD-10-CM | POA: Diagnosis not present

## 2020-08-08 DIAGNOSIS — I5033 Acute on chronic diastolic (congestive) heart failure: Secondary | ICD-10-CM | POA: Diagnosis not present

## 2020-08-08 DIAGNOSIS — E1151 Type 2 diabetes mellitus with diabetic peripheral angiopathy without gangrene: Secondary | ICD-10-CM | POA: Diagnosis not present

## 2020-08-08 DIAGNOSIS — L03115 Cellulitis of right lower limb: Secondary | ICD-10-CM | POA: Diagnosis not present

## 2020-08-08 DIAGNOSIS — J449 Chronic obstructive pulmonary disease, unspecified: Secondary | ICD-10-CM | POA: Diagnosis not present

## 2020-08-08 DIAGNOSIS — I959 Hypotension, unspecified: Secondary | ICD-10-CM | POA: Diagnosis not present

## 2020-08-08 DIAGNOSIS — I872 Venous insufficiency (chronic) (peripheral): Secondary | ICD-10-CM | POA: Diagnosis not present

## 2020-08-08 DIAGNOSIS — J9601 Acute respiratory failure with hypoxia: Secondary | ICD-10-CM | POA: Diagnosis not present

## 2020-08-08 DIAGNOSIS — I130 Hypertensive heart and chronic kidney disease with heart failure and stage 1 through stage 4 chronic kidney disease, or unspecified chronic kidney disease: Secondary | ICD-10-CM | POA: Diagnosis not present

## 2020-08-08 DIAGNOSIS — N1831 Chronic kidney disease, stage 3a: Secondary | ICD-10-CM | POA: Diagnosis not present

## 2020-08-08 DIAGNOSIS — I890 Lymphedema, not elsewhere classified: Secondary | ICD-10-CM | POA: Diagnosis not present

## 2020-08-08 NOTE — Telephone Encounter (Signed)
Spoke with  Pt and advised shower is not covered he can called [facility] and get it

## 2020-08-08 NOTE — Telephone Encounter (Signed)
Gave verbal order to stacy from [facility] [phone]  Continue for Physical therapy 2 twice a week for 4 weeks and once a week  For 3 weeks

## 2020-08-09 ENCOUNTER — Other Ambulatory Visit: Payer: Self-pay

## 2020-08-09 ENCOUNTER — Other Ambulatory Visit: Payer: Self-pay | Admitting: *Deleted

## 2020-08-09 ENCOUNTER — Encounter: Payer: Self-pay | Admitting: *Deleted

## 2020-08-09 DIAGNOSIS — J4520 Mild intermittent asthma, uncomplicated: Secondary | ICD-10-CM

## 2020-08-09 MED ORDER — VENTOLIN HFA 108 (90 BASE) MCG/ACT IN AERS: 2.0000 | INHALATION_SPRAY | Freq: Four times a day (QID) | RESPIRATORY_TRACT | 1 refills | Status: DC | PRN

## 2020-08-09 NOTE — Patient Outreach (Signed)
[facility] Customer service manager [facility]) Care Management [facility] CM Telephone Outreach PCP office completes Transition of Care follow up post- hospital discharge Post- (most recent) hospital discharge day # 2  08/09/2020  Thomas Tanner 02/19/1951 [id number]  Successfuloutreach attempt to  [name], 69 y/o male referred to [facility] CM by [facility] RN [facility] Liaison8/10/21for multiple recent hospital and ED visits.  Patient was recently hospitalized August 2-9, 2021 for acute respiratory failure with hypoxia/ CHF exacerbation with shortness of breath and significant peripheral edema. He was discharged home to self-care with home health services in place for PT/ OT. Patient has history including, but not limited to, dCHF; COPD; CKD-III; CAD; HTN/ HLD; DM' chronic pain; morbid obesity, pressure ulcer, and COVID-19 infection.  Patient re-presented to ED 07/20/20 for hypotension and Thomas Tanner was re-admitted to hospital August 11-13, 2038for hypotension and AKI; he was again discharged home to self-care with established home health services in place.  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 Pt had  a thrombectomy by vascular surgery on 08/03/20.  HIPAA/ identity verified; patient reports, "I'm just happy to be alive;" he states that he is hopeful that "they finally found the problem," and he denies current clinical concerns today.  Reports that his spouse has taken 2 weeks off of work to assist with care needs post-hospital discharge.  He sounds to be in no distress throughout phone call today.  -- confirmed home health team remains active; encouraged patient's ongoing participation -- confirmed no medication concerns: patient reports has and is taking all as prescribed; agrees to medication review at time of next call Friday  08/12/20; confirms that he has spoken to [facility] Pharmacist around concerns affording inhaler- encouraged his ongoing engagement with [facility] Pharmacist -- reviewed post-hospital discharge instructions from 08/07/20; confirms he is almost completed the prednisone therapy; he is agreeable to medication review this Friday 9/3, as previously scheduled -- reviewed upcoming scheduled provider appointments; confirmed patient is aware of all and has plans to attend all as scheduled ---- CHF clinic appointment 08/11/20 ---- PCP office visit 08/16/20- video visit ---- to call Dr. Wyn Quaker this week to schedule hospital follow up appointment -- confirmed that patient has obtained recently ordered DME for wheelchair/ home O2; BSC -- has continued smoking "about 5 cigarettes a day;" counseled on need to turn home O2 off and to smoke outside when he smokes- he verbalizes understanding- efforts to encourage smoking cessation continued -- has started "seriously following" low-salt heart healthy diet; family assisting with efforts -- continues unable to complete daily weights, as he states he is unable to find scales that go > 400 lbs; verbalizes plans to contact insurance company to inquire; this was encouraged; discussed other clinical signs/ symptoms CHF yellow zone and encouraged patient to promptly contact PCP/ care providers for any concerns that arise- he is agreeable  Patient denies further issues, concerns, or problems today. I confirmed that patient hasmy direct phone number, the main [facility] CM office phone number, and the [facility] CM 24-hour nurse advice phone number should issues arise prior to next scheduled [facility] CM outreach post-upcoming provider appointments.  Encouraged patient to contact me directly if needs, questions, issues, or concerns arise prior to next scheduled outreach; patient agreed to do so.  Plan:  Patient will take medications as prescribed and will attend all scheduled provider appointments  Patient will  promptly notify  care providers for any new concerns/ issues/ problems that arise  Patient will actively participate in home health services as ordered post-hospital discharge  Patient will review educational material mailed to him 08/01/20 around Advanced directives; smoking cessation; fall prevention  I will send secure message via EHR to CHF clinic team in an effort to facilitate patient obtaining bariatric scales for daily weight monitoring at home  [facility] CM outreach to continue with scheduled phone call this Friday, 08/12/20 for medication review  Caryl Pina, RN, BSN, CCRN Alumnus Community Care Coordinator [facility] Care Management  [phone]   ===View-only below this line=== ----- Message ----- From: Michaela Corner, RN Sent: 08/09/2020   4:02 PM EDT To: Delma Freeze, FNP Subject: Lorain Childes- patient needs scales; has OV with you o*  Hi Inetta Fermo,  Just spoke with patient and he is planning to attend office visit you on this Thursday 08/11/20.  Wanted to let you know that he has not been able to find scales that will support his weight (> 400 lbs) for in-home daily weight monitoring; he verbalized plans to contact his insurance company to see if they could help-- but I thought I would ask you as well, as I know your team is sometimes able to make things happen faster than the insurance companies.  Just wanted to run it by you.  Thanks,  Caryl Pina, RN, BSN, [employer] Coordinator [facility] Care Management  [phone]

## 2020-08-10 DIAGNOSIS — N1831 Chronic kidney disease, stage 3a: Secondary | ICD-10-CM | POA: Diagnosis not present

## 2020-08-10 DIAGNOSIS — J9601 Acute respiratory failure with hypoxia: Secondary | ICD-10-CM | POA: Diagnosis not present

## 2020-08-10 DIAGNOSIS — J449 Chronic obstructive pulmonary disease, unspecified: Secondary | ICD-10-CM | POA: Diagnosis not present

## 2020-08-10 DIAGNOSIS — I5033 Acute on chronic diastolic (congestive) heart failure: Secondary | ICD-10-CM | POA: Diagnosis not present

## 2020-08-10 DIAGNOSIS — I872 Venous insufficiency (chronic) (peripheral): Secondary | ICD-10-CM | POA: Diagnosis not present

## 2020-08-10 DIAGNOSIS — I130 Hypertensive heart and chronic kidney disease with heart failure and stage 1 through stage 4 chronic kidney disease, or unspecified chronic kidney disease: Secondary | ICD-10-CM | POA: Diagnosis not present

## 2020-08-10 DIAGNOSIS — E1122 Type 2 diabetes mellitus with diabetic chronic kidney disease: Secondary | ICD-10-CM | POA: Diagnosis not present

## 2020-08-10 DIAGNOSIS — I959 Hypotension, unspecified: Secondary | ICD-10-CM | POA: Diagnosis not present

## 2020-08-10 DIAGNOSIS — I2510 Atherosclerotic heart disease of native coronary artery without angina pectoris: Secondary | ICD-10-CM | POA: Diagnosis not present

## 2020-08-10 DIAGNOSIS — L03115 Cellulitis of right lower limb: Secondary | ICD-10-CM | POA: Diagnosis not present

## 2020-08-10 DIAGNOSIS — E1151 Type 2 diabetes mellitus with diabetic peripheral angiopathy without gangrene: Secondary | ICD-10-CM | POA: Diagnosis not present

## 2020-08-10 DIAGNOSIS — N179 Acute kidney failure, unspecified: Secondary | ICD-10-CM | POA: Diagnosis not present

## 2020-08-10 DIAGNOSIS — I890 Lymphedema, not elsewhere classified: Secondary | ICD-10-CM | POA: Diagnosis not present

## 2020-08-11 ENCOUNTER — Telehealth: Payer: Self-pay

## 2020-08-11 ENCOUNTER — Encounter: Payer: Self-pay | Admitting: Family

## 2020-08-11 ENCOUNTER — Other Ambulatory Visit: Payer: Self-pay

## 2020-08-11 ENCOUNTER — Ambulatory Visit: Payer: Medicare Other | Attending: Family | Admitting: Family

## 2020-08-11 VITALS — BP 102/62 | HR 99 | Resp 20 | Ht 73.0 in | Wt >= 6400 oz

## 2020-08-11 DIAGNOSIS — M1990 Unspecified osteoarthritis, unspecified site: Secondary | ICD-10-CM | POA: Diagnosis not present

## 2020-08-11 DIAGNOSIS — Z79899 Other long term (current) drug therapy: Secondary | ICD-10-CM | POA: Diagnosis not present

## 2020-08-11 DIAGNOSIS — I10 Essential (primary) hypertension: Secondary | ICD-10-CM

## 2020-08-11 DIAGNOSIS — J449 Chronic obstructive pulmonary disease, unspecified: Secondary | ICD-10-CM | POA: Insufficient documentation

## 2020-08-11 DIAGNOSIS — Z955 Presence of coronary angioplasty implant and graft: Secondary | ICD-10-CM | POA: Diagnosis not present

## 2020-08-11 DIAGNOSIS — I5032 Chronic diastolic (congestive) heart failure: Secondary | ICD-10-CM | POA: Diagnosis not present

## 2020-08-11 DIAGNOSIS — E669 Obesity, unspecified: Secondary | ICD-10-CM | POA: Diagnosis not present

## 2020-08-11 DIAGNOSIS — I2692 Saddle embolus of pulmonary artery without acute cor pulmonale: Secondary | ICD-10-CM

## 2020-08-11 DIAGNOSIS — F17210 Nicotine dependence, cigarettes, uncomplicated: Secondary | ICD-10-CM | POA: Insufficient documentation

## 2020-08-11 DIAGNOSIS — Z7901 Long term (current) use of anticoagulants: Secondary | ICD-10-CM | POA: Diagnosis not present

## 2020-08-11 DIAGNOSIS — I130 Hypertensive heart and chronic kidney disease with heart failure and stage 1 through stage 4 chronic kidney disease, or unspecified chronic kidney disease: Secondary | ICD-10-CM | POA: Diagnosis not present

## 2020-08-11 DIAGNOSIS — Z8673 Personal history of transient ischemic attack (TIA), and cerebral infarction without residual deficits: Secondary | ICD-10-CM | POA: Insufficient documentation

## 2020-08-11 DIAGNOSIS — I2782 Chronic pulmonary embolism: Secondary | ICD-10-CM

## 2020-08-11 DIAGNOSIS — N189 Chronic kidney disease, unspecified: Secondary | ICD-10-CM | POA: Insufficient documentation

## 2020-08-11 DIAGNOSIS — E1122 Type 2 diabetes mellitus with diabetic chronic kidney disease: Secondary | ICD-10-CM | POA: Insufficient documentation

## 2020-08-11 DIAGNOSIS — E785 Hyperlipidemia, unspecified: Secondary | ICD-10-CM | POA: Insufficient documentation

## 2020-08-11 NOTE — Telephone Encounter (Signed)
Confirmed video visit on 9/7

## 2020-08-11 NOTE — Progress Notes (Signed)
Patient ID: Thomas Tanner, male    DOB: 1951/02/24, 69 y.o.   MRN: [id number]  HPI  Thomas Tanner is a 69 y/o male with a history of DM, hyperlipidemia, HTN, CKD, COPD, TIA, recent tobacco use and chronic heart failure.   Echo report from 08/03/20 reviewed and showed an EF of >50%. Echo report from 05/25/20 reviewed and showed an EF of 50-55% along with mild LVH.   Catheterization done 12/08/18 and showed:  Mid RCA lesion is 99% stenosed.  Mid Cx lesion is 50% stenosed.  Mid LAD lesion is 90% stenosed.  Post intervention, there is a 0% residual stenosis.  A stent was successfully placed.  Post intervention, there is a 0% residual stenosis.  A stent was successfully placed.  Successful DES stent to mid RCA as well as mid LAD both with DES  Admitted 08/02/20 due to shortness of breath. Initially given IV lasix with transition to oral diuretics. Given nebulizer treatment due to COPD. Chest CT showed multiple PE with heparin drip started and then transitioned to eliquis. Thrombectomy by vascular surgery on 08/03/20. PT/OT evaluation done. Discharged after 5 days. Admitted 07/20/20 due to hypotension and acute renal failure. Given IVF. HCTZ stopped. Discharged after 2 days. Admitted 07/10/20 due to acute on chronic HF with weeping of legs. Cardiology and wound consults obtained. Initially given IV lasix with transition to oral diuretics. On antibiotics for leg erythema. Initially placed on oxygen but then weaned to room air. Elevated troponin thought to be due to demand ischemia. Discharged after 8 days.   He presents today for his initial visit with a chief complaint of mild shortness of breath upon moderate exertion. He describes this as having been present for several months although has improved greatly since recent admission. He has associated fatigue, pedal edema and chronic right hip/ knee pain. He has associated difficulty sleeping, dizziness, abdominal distention, palpitations,  chest pain or cough.   Does not have scales so hasn't been weighing himself daily. Reports worsening pain in his knee/ hip since not being able to take meloxicam.   Past Medical History:  Diagnosis Date  . Arthritis   . CHF (congestive heart failure) (HCC)   . COPD (chronic obstructive pulmonary disease) (HCC)   . Diabetes (HCC)   . Hyperlipidemia   . Hypertension   . Kidney stone   . Obesity   . Tachycardia   . TIA (transient ischemic attack)    Past Surgical History:  Procedure Laterality Date  . CORONARY/GRAFT ACUTE MI REVASCULARIZATION N/A 12/08/2018   Procedure: Coronary/Graft Acute MI Revascularization;  Surgeon: Alwyn Pea, MD;  Location: [facility] INVASIVE CV LAB;  Service: Cardiovascular;  Laterality: N/A;  . KIDNEY STONE SURGERY    . left arm surgery  1963  . LEFT HEART CATH AND CORONARY ANGIOGRAPHY N/A 12/08/2018   Procedure: LEFT HEART CATH AND CORONARY ANGIOGRAPHY;  Surgeon: Alwyn Pea, MD;  Location: [facility] INVASIVE CV LAB;  Service: Cardiovascular;  Laterality: N/A;  . PULMONARY THROMBECTOMY N/A 08/03/2020   Procedure: PULMONARY THROMBECTOMY / THROMBOLYSIS;  Surgeon: Annice Needy, MD;  Location: [facility] INVASIVE CV LAB;  Service: Cardiovascular;  Laterality: N/A;   Family History  Problem Relation Age of Onset  . Alzheimer's disease Mother   . CAD Father    Social History   Tobacco Use  . Smoking status: Former Smoker    Packs/day: 0.50    Types: Cigarettes    Quit date: 07/13/2020    Years since quitting:  0.0  . Smokeless tobacco: Never Used  . Tobacco comment: states "I quit 3 weeks ago."  Substance Use Topics  . Alcohol use: No   Allergies  Allergen Reactions  . Morphine Anxiety and Other (See Comments)    Hallucinations  . Morphine And Related Anxiety   Prior to Admission medications   Medication Sig Start Date End Date Taking? Authorizing Provider  apixaban (ELIQUIS) 5 MG TABS tablet Take 1 tablet (5 mg total) by mouth 2 (two) times daily.  08/11/20 09/10/20 Yes Charise Killian, MD  famotidine (PEPCID) 20 MG tablet Take 1 tablet (20 mg total) by mouth 2 (two) times daily. Patient taking differently: Take 20 mg by mouth daily.  03/09/20  Yes Boscia, Kathlynn Grate, NP  furosemide (LASIX) 20 MG tablet Take 1 tablet (20 mg total) by mouth daily. 07/23/20  Yes Enedina Finner, MD  Boris Lown Oil (OMEGA-3) 500 MG CAPS Take 1 tab po daily Patient taking differently: Take 500 mg by mouth daily.  06/04/19  Yes Boscia, Heather E, NP  lisinopril (ZESTRIL) 10 MG tablet Take 1 tablet (10 mg total) by mouth daily. 07/22/20  Yes Enedina Finner, MD  metoprolol succinate (TOPROL-XL) 50 MG 24 hr tablet Take 1 tablet (50 mg total) by mouth daily. 02/08/20  Yes Boscia, Kathlynn Grate, NP  predniSONE (DELTASONE) 20 MG tablet Take 2 tablets (40 mg total) by mouth daily for 5 days. 08/07/20 08/12/20 Yes Charise Killian, MD  rosuvastatin (CRESTOR) 20 MG tablet Take 1 tablet (20 mg total) by mouth daily. 05/16/20  Yes Boscia, Kathlynn Grate, NP  traMADol (ULTRAM) 50 MG tablet Take 1 tablet (50 mg total) by mouth every 8 (eight) hours as needed. Patient taking differently: Take 50 mg by mouth every 8 (eight) hours as needed for moderate pain.  08/01/20  Yes Boscia, Kathlynn Grate, NP  VENTOLIN HFA 108 (90 Base) MCG/ACT inhaler Inhale 2 puffs into the lungs every 6 (six) hours as needed for wheezing or shortness of breath. 08/09/20  Yes Boscia, Heather E, NP  nicotine (NICODERM CQ - DOSED IN MG/24 HOURS) 21 mg/24hr patch Place 1 patch (21 mg total) onto the skin daily. Patient not taking: Reported on 08/11/2020 08/01/20   Carlean Jews, NP     Review of Systems  Constitutional: Positive for fatigue. Negative for appetite change.  HENT: Negative for congestion, postnasal drip and sore throat.   Eyes: Negative.   Respiratory: Positive for shortness of breath. Negative for cough.   Cardiovascular: Positive for leg swelling. Negative for chest pain and palpitations.  Gastrointestinal: Negative  for abdominal distention and abdominal pain.  Endocrine: Negative.   Genitourinary: Negative.   Musculoskeletal: Positive for arthralgias (right hip/ knee). Negative for back pain.  Skin: Negative.   Allergic/Immunologic: Negative.   Neurological: Negative for dizziness and light-headedness.  Hematological: Negative for adenopathy. Does not bruise/bleed easily.  Psychiatric/Behavioral: Negative for dysphoric mood and sleep disturbance (sleeping on reclining couch with oxygen at 2.5L ). The patient is not nervous/anxious.     Vitals:   08/11/20 1511  BP: 102/62  Pulse: 99  Resp: 20  SpO2: 93%  Weight: (!) 420 lb (190.5 kg)  Height: 6\' 1"  (1.854 m)   Wt Readings from Last 3 Encounters:  08/11/20 (!) 420 lb (190.5 kg)  08/07/20 (!) 420 lb 9.6 oz (190.8 kg)  08/01/20 (!) 420 lb (190.5 kg)   Lab Results  Component Value Date   CREATININE 1.04 08/07/2020   CREATININE 1.18 08/06/2020  CREATININE 1.20 08/05/2020    Physical Exam Vitals and nursing note reviewed.  Constitutional:      Appearance: Normal appearance.  HENT:     Head: Normocephalic and atraumatic.  Cardiovascular:     Rate and Rhythm: Normal rate and regular rhythm.  Pulmonary:     Effort: Pulmonary effort is normal. No respiratory distress.     Breath sounds: Wheezing (few expiratory in lower lobes) present. No rales.     Comments: Diminished due to chest wall size Abdominal:     Palpations: Abdomen is soft.     Tenderness: There is no abdominal tenderness.  Musculoskeletal:        General: No tenderness.     Cervical back: Neck supple.     Right lower leg: Edema (2+ pitting) present.     Left lower leg: Edema (1+ pitting) present.  Skin:    General: Skin is warm and dry.  Neurological:     General: No focal deficit present.     Mental Status: He is alert and oriented to person, place, and time.  Psychiatric:        Mood and Affect: Mood normal.        Behavior: Behavior normal.        Thought  Content: Thought content normal.    Assessment & Plan:  1: Chronic heart failure with preserved ejection fraction without structural changes- - NYHA class II - euvolemic today - does not have scales as he can't find any that is for >400 pounds; advised patient that we will have to order a set for him - not adding any salt to his food; encouraged to read food labels so as to follow a low sodium diet - saw cardiology (Fath) during 07/10/20 admission & sees Dr. Juliann Pares 08/25/20 - says that he's receiving PT twice/ week - BNP 08/03/20 was 218.8 - consider changing diuretic if edema persists  2: HTN- - BP looks good although on the low side - saw PCP Hermenia Fiscal) 08/01/20 - BMP 08/07/20 reviewed and showed sodium 135, potassium 5.0, creatinine 1.04 and GFR >60  3: COPD- - saw pulmonology Juanda Bond) 06/06/20 - has smoked ~ 10 cigarettes daily in the last 4 days; currently can't afford nicoderm patches - has been smoking since age 41 and occasionally even some at even earlier age (young as 69 yr old)  4: PE- - currently on apixaban - sees vascular (Dew) 08/19/20   Patient did not bring his medications nor a list. Each medication was verbally reviewed with the patient and he was encouraged to bring the bottles to every visit to confirm accuracy of list.  Return in 2 months or sooner for any questions/problems before then.

## 2020-08-12 ENCOUNTER — Other Ambulatory Visit: Payer: Self-pay | Admitting: *Deleted

## 2020-08-12 ENCOUNTER — Encounter: Payer: Self-pay | Admitting: *Deleted

## 2020-08-12 NOTE — Patient Outreach (Signed)
[facility] service manager [facility]) Care Management [facility] CM Telephone Outreach PCP office completes Transition of Care follow up post-hospital discharge Post-hospital discharge day # 5  08/12/2020  Thomas Tanner [birth year] [id number]  Successfultelephone outreach attempt to  Thomas Tanner, 69 y/o male referred to [facility] CM by [facility] RN [facility] Liaison8/10/21for multiple recent hospital and ED visits.  Patient was recently hospitalized August 2-9, 2021 for acute respiratory failure with hypoxia/ CHF exacerbation with shortness of breath and significant peripheral edema. He was discharged home to self-care with home health services in place for PT/ OT. Patient has history including, but not limited to, dCHF; COPD; CKD-III; CAD; HTN/ HLD; DM' chronic pain; morbid obesity, pressure ulcer, and COVID-19 infection.  Patient re-presented to ED 07/20/20 for hypotension and Thomas Tanner was re-admitted to hospital August 11-13, 2020for hypotension and AKI; he was again discharged home to self-care with established home health services in place.  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 Pt had a thrombectomy by vascular surgery on 08/03/20.  HIPAA/ identity verified; patient reports, "doing well," and states he tolerated office visit with CHF clinic yesterday "better than" he thought he would.  He denies pain outside of his normal chronic pain and denies clinical concerns.  He sounds to be in no distress throughout phone call today.  -- medication review completed; no concerns/ problems identified; patient verbalizes a good general understanding of the purpose/ dosing/ scheduling of his medications and he continues self-managing -- review of recent CHF clinic visit: confirmed that patient discussed obtaining bariatric scales with  provider per care coordination outreach on 08/09/20- in progress -- using teach back method, provided education around CHF yellow zone signs/ symptoms along with corresponding action plan -- provided education around signs/ symptoms MI/ CVA along with corresponding action plan -- discussed plan for care management follow up and confirmed patient understands to call care provider team and has all contact information -- provided education around value of daily weight monitoring/ recording at home once he obtains bariatric scales; process for daily weights; importance of recording weights on calendar; weight gain guidelines/ action plan in setting of CHF ---- baseline weight per CHF clinic office visit yesterday: 420 lbs -- continues using home O2, especially with activity; SaO2 levels at home 93% while on room air/ at rest  -- reviewed upcoming provider appointments; confirmed patient has accurate understanding of all and has plans to attend as scheduled ---- PCP video visit 08/16/20 ---- vascular visit 08/19/20 ---- cardiology office visit scheduled 08/25/20 ---- CHF clinic 10/03/20  Patient denies further issues, concerns, or problems today. Iconfirmed that patient hasmy direct phone number, the main [facility] CM office phone number, and the [facility] CM 24-hour nurse advice phone number should issues arise prior to next scheduled [facility] CM outreach. Encouraged patient to contact me directly if needs, questions, issues, or concerns arise prior to next scheduled outreach; patient agreed to do so.  Plan:  Patient will take medications as prescribed and will attend all scheduled provider appointments  Patient will promptly notify care providers for any new concerns/ issues/ problems that arise  Patient will actively participate in home health services as ordered post-hospital discharge  Patient will review educational material mailed to him 08/01/20 around Advanced directives; smoking cessation; fall  prevention  I will send today's [facility] CM note and care plan goals to patient's PCP as [facility]  CM initial assessment  [facility] CM outreach to continue with scheduled phone call within 3 weeks  Thomas Pina, RN, BSN, CCRN Alumnus [facility] Coordinator [facility] Care Management  [phone]

## 2020-08-16 ENCOUNTER — Encounter: Payer: Self-pay | Admitting: Hospice and Palliative Medicine

## 2020-08-16 ENCOUNTER — Ambulatory Visit (INDEPENDENT_AMBULATORY_CARE_PROVIDER_SITE_OTHER): Payer: Medicare Other | Admitting: Hospice and Palliative Medicine

## 2020-08-16 DIAGNOSIS — I10 Essential (primary) hypertension: Secondary | ICD-10-CM | POA: Diagnosis not present

## 2020-08-16 DIAGNOSIS — R0902 Hypoxemia: Secondary | ICD-10-CM

## 2020-08-16 DIAGNOSIS — N1831 Chronic kidney disease, stage 3a: Secondary | ICD-10-CM | POA: Diagnosis not present

## 2020-08-16 DIAGNOSIS — I890 Lymphedema, not elsewhere classified: Secondary | ICD-10-CM | POA: Diagnosis not present

## 2020-08-16 DIAGNOSIS — I5022 Chronic systolic (congestive) heart failure: Secondary | ICD-10-CM

## 2020-08-16 DIAGNOSIS — J9601 Acute respiratory failure with hypoxia: Secondary | ICD-10-CM | POA: Diagnosis not present

## 2020-08-16 DIAGNOSIS — I959 Hypotension, unspecified: Secondary | ICD-10-CM | POA: Diagnosis not present

## 2020-08-16 DIAGNOSIS — I2510 Atherosclerotic heart disease of native coronary artery without angina pectoris: Secondary | ICD-10-CM | POA: Diagnosis not present

## 2020-08-16 DIAGNOSIS — I872 Venous insufficiency (chronic) (peripheral): Secondary | ICD-10-CM | POA: Diagnosis not present

## 2020-08-16 DIAGNOSIS — N179 Acute kidney failure, unspecified: Secondary | ICD-10-CM | POA: Diagnosis not present

## 2020-08-16 DIAGNOSIS — L03115 Cellulitis of right lower limb: Secondary | ICD-10-CM | POA: Diagnosis not present

## 2020-08-16 DIAGNOSIS — J449 Chronic obstructive pulmonary disease, unspecified: Secondary | ICD-10-CM | POA: Diagnosis not present

## 2020-08-16 DIAGNOSIS — Z09 Encounter for follow-up examination after completed treatment for conditions other than malignant neoplasm: Secondary | ICD-10-CM

## 2020-08-16 DIAGNOSIS — I5033 Acute on chronic diastolic (congestive) heart failure: Secondary | ICD-10-CM | POA: Diagnosis not present

## 2020-08-16 DIAGNOSIS — E1122 Type 2 diabetes mellitus with diabetic chronic kidney disease: Secondary | ICD-10-CM | POA: Diagnosis not present

## 2020-08-16 DIAGNOSIS — I130 Hypertensive heart and chronic kidney disease with heart failure and stage 1 through stage 4 chronic kidney disease, or unspecified chronic kidney disease: Secondary | ICD-10-CM | POA: Diagnosis not present

## 2020-08-16 DIAGNOSIS — E1151 Type 2 diabetes mellitus with diabetic peripheral angiopathy without gangrene: Secondary | ICD-10-CM | POA: Diagnosis not present

## 2020-08-16 MED ORDER — APIXABAN 5 MG PO TABS: 5.0000 mg | ORAL_TABLET | Freq: Two times a day (BID) | ORAL | 1 refills | Status: DC

## 2020-08-16 NOTE — Progress Notes (Signed)
[facility] [address] [place], [place] [address]  Internal MEDICINE  Telephone // video Visit// transitional care managment   Patient Name: Thomas Tanner  [id number]  [id number]  Date of Service: 08/16/2020  I connected with the patient at 1235 by telephone and verified the patients identity using two identifiers.   I discussed the limitations, risks, security and privacy concerns of performing an evaluation and management service by telephone and the availability of in person appointments. I also discussed with the patient that there may be a patient responsible charge related to the service.  The patient expressed understanding and agrees to proceed.    Chief Complaint  Patient presents with  . Follow-up    refills  . Telephone Screen    [phone]   . Telephone Assessment    phone call  . Quality Metric Gaps    TDAP, HepC  . Hyperlipidemia  . Hypertension  . Diabetes    HPI Patient is being seen today for hospital follow-up. He was hospitalized 08/02/2020-08/07/2020 for saddle pulmonary embolism. Treated with heparin infusion as well as vascular intervention, thrombectomy on 8/25 by Dr. Wyn Quaker. Also found to have RLE DVT. Also found to have acute on chronic diastolic heart failure with BNP at 503. He was discharged home on apixaban and furosemide was restarted.  Since returning home he feels much improvement. His breathing has significantly improved. He is not wearing his oxygen continuously, is keeping a close check on his SpO2 levels. Levels ranging 90-92% on RA.   Scheduled to see Dr. Wyn Quaker on Friday for follow-up and Dr. Juliann Pares on the 16th  Current Medication: Outpatient Encounter Medications as of 08/16/2020  Medication Sig Note  . apixaban (ELIQUIS) 5 MG TABS tablet Take 1 tablet (5 mg total) by mouth 2 (two) times daily.   . famotidine (PEPCID) 20 MG tablet Take 1 tablet (20 mg total) by mouth 2 (two) times daily. (Patient taking differently:  Take 20 mg by mouth daily. )   . furosemide (LASIX) 20 MG tablet Take 1 tablet (20 mg total) by mouth daily.   Boris Lown Oil (OMEGA-3) 500 MG CAPS Take 1 tab po daily (Patient taking differently: Take 500 mg by mouth daily. )   . lisinopril (ZESTRIL) 10 MG tablet Take 1 tablet (10 mg total) by mouth daily.   . metoprolol succinate (TOPROL-XL) 50 MG 24 hr tablet Take 1 tablet (50 mg total) by mouth daily.   . nicotine (NICODERM CQ - DOSED IN MG/24 HOURS) 21 mg/24hr patch Place 1 patch (21 mg total) onto the skin daily. 08/12/2020: Reports to start later; counseled around being completely quit smoking prior to starting- patient reports he is aware  . rosuvastatin (CRESTOR) 20 MG tablet Take 1 tablet (20 mg total) by mouth daily.   . traMADol (ULTRAM) 50 MG tablet Take 1 tablet (50 mg total) by mouth every 8 (eight) hours as needed. (Patient taking differently: Take 50 mg by mouth every 8 (eight) hours as needed for moderate pain. ) 08/12/2020: Uses as needed  . VENTOLIN HFA 108 (90 Base) MCG/ACT inhaler Inhale 2 puffs into the lungs every 6 (six) hours as needed for wheezing or shortness of breath. 08/12/2020: Reports uses prn- reports uses 1-2 times per day  . [DISCONTINUED] apixaban (ELIQUIS) 5 MG TABS tablet Take 1 tablet (5 mg total) by mouth 2 (two) times daily.    No facility-administered encounter medications on file as of 08/16/2020.    Surgical History: Past Surgical  History:  Procedure Laterality Date  . CORONARY/GRAFT ACUTE MI REVASCULARIZATION N/A 12/08/2018   Procedure: Coronary/Graft Acute MI Revascularization;  Surgeon: Alwyn Pea, MD;  Location: [facility] INVASIVE CV LAB;  Service: Cardiovascular;  Laterality: N/A;  . KIDNEY STONE SURGERY    . left arm surgery  1963  . LEFT HEART CATH AND CORONARY ANGIOGRAPHY N/A 12/08/2018   Procedure: LEFT HEART CATH AND CORONARY ANGIOGRAPHY;  Surgeon: Alwyn Pea, MD;  Location: [facility] INVASIVE CV LAB;  Service: Cardiovascular;  Laterality: N/A;   . PULMONARY THROMBECTOMY N/A 08/03/2020   Procedure: PULMONARY THROMBECTOMY / THROMBOLYSIS;  Surgeon: Annice Needy, MD;  Location: [facility] INVASIVE CV LAB;  Service: Cardiovascular;  Laterality: N/A;    Medical History: Past Medical History:  Diagnosis Date  . Arthritis   . CHF (congestive heart failure) (HCC)   . COPD (chronic obstructive pulmonary disease) (HCC)   . Diabetes (HCC)   . Hyperlipidemia   . Hypertension   . Kidney stone   . Obesity   . Tachycardia   . TIA (transient ischemic attack)     Family History: Family History  Problem Relation Age of Onset  . Alzheimer's disease Mother   . CAD Father     Social History   Socioeconomic History  . Marital status: Married    Spouse name: [name]   . Number of children: 3  . Years of education: Not on file  . Highest education level: Not on file  Occupational History  . Not on file  Tobacco Use  . Smoking status: Light Tobacco Smoker    Packs/day: 0.50    Types: Cigarettes    Last attempt to quit: 07/13/2020    Years since quitting: 0.0  . Smokeless tobacco: Never Used  . Tobacco comment: states "I quit 3 weeks ago."  Substance and Sexual Activity  . Alcohol use: No  . Drug use: No  . Sexual activity: Not Currently  Other Topics Concern  . Not on file  Social History Narrative   Lives at home with wife , youngest son, grand daughter and great greatson    Social Determinants of Health   Financial Resource Strain:   . Difficulty of Paying Living Expenses: Not on file  Food Insecurity: No Food Insecurity  . Worried About Programme researcher, broadcasting/film/video in the Last Year: Never true  . Ran Out of Food in the Last Year: Never true  Transportation Needs: No Transportation Needs  . Lack of Transportation (Medical): No  . Lack of Transportation (Non-Medical): No  Physical Activity:   . Days of Exercise per Week: Not on file  . Minutes of Exercise per Session: Not on file  Stress:   . Feeling of Stress : Not on file  Social  Connections:   . Frequency of Communication with Friends and Family: Not on file  . Frequency of Social Gatherings with Friends and Family: Not on file  . Attends Religious Services: Not on file  . Active Member of Clubs or Organizations: Not on file  . Attends Banker Meetings: Not on file  . Marital Status: Not on file  Intimate Partner Violence:   . Fear of Current or Ex-Partner: Not on file  . Emotionally Abused: Not on file  . Physically Abused: Not on file  . Sexually Abused: Not on file   Review of Systems  Constitutional: Negative for chills, fatigue and unexpected weight change.  HENT: Negative for congestion, postnasal drip, rhinorrhea, sneezing and sore  throat.   Eyes: Negative for photophobia and visual disturbance.  Respiratory: Negative for cough, chest tightness and shortness of breath.   Cardiovascular: Negative for chest pain, palpitations and leg swelling.  Gastrointestinal: Negative for abdominal pain, constipation, diarrhea, nausea and vomiting.  Genitourinary: Negative for dysuria and frequency.  Musculoskeletal: Negative for arthralgias, back pain, joint swelling and neck pain.  Skin: Negative for rash.  Neurological: Negative for tremors and numbness.  Hematological: Negative for adenopathy. Does not bruise/bleed easily.  Psychiatric/Behavioral: Negative for behavioral problems (Depression), sleep disturbance and suicidal ideas. The patient is not nervous/anxious.     Vital Signs: BP (!) 110/50   Pulse 89   Temp 97.9 F (36.6 C)   Resp 16   Ht 6\' 1"  (1.854 m)   Wt (!) 420 lb (190.5 kg)   SpO2 93%   BMI 55.41 kg/m    Observation/Objective: No acute distress noted.   Assessment/Plan: 1. Hospital discharge follow-up Discharged 08/07/2020, admission for large saddle PE, RLE DVT, elevated BNP. Was discharged on Eliquis for PE and DVT. Requesting refills at this time. Advised to discuss plan of care with Dr. 08/09/2020 later this week. - apixaban  (ELIQUIS) 5 MG TABS tablet; Take 1 tablet (5 mg total) by mouth 2 (two) times daily.  Dispense: 180 tablet; Refill: 1  2. Hypoxia SpO2 levels have significantly improved since discharge. Wearing supplemental oxygen intermittently as his SpO2 levels are ranging low 90% on RA. Encouraged to continue closely monitoring SpO2 levels and wearing supplemental oxygen as needed. Pt will need sleep study   3. Chronic systolic congestive heart failure (HCC) Shortness of breath as well as lower extremity edema significantly improved since hospitalization. Will follow-up with cardiologist later this month. At this time continue with current therapy. Echo completed while hospitalized, LVEF >50%. Will continue monitoring.  4. Essential hypertension BP and HR stable today, will continue with current therapy at this time and continue monitoring.  General Counseling: Orval verbalizes understanding of the findings of today's phone visit and agrees with plan of treatment. I have discussed any further diagnostic evaluation that may be needed or ordered today. We also reviewed his medications today. he has been encouraged to call the office with any questions or concerns that should arise related to todays visit.  Meds ordered this encounter  Medications  . apixaban (ELIQUIS) 5 MG TABS tablet    Sig: Take 1 tablet (5 mg total) by mouth 2 (two) times daily.    Dispense:  180 tablet    Refill:  1    Only start taking eliquis 5 mg BID on 08/11/20     Time spent: 30 Minutes Time spent includes review of chart, medications, test results and follow-up plan with the patient.  10/11/20 Shantell Belongia AGNP-C Internal medicine

## 2020-08-18 ENCOUNTER — Other Ambulatory Visit (INDEPENDENT_AMBULATORY_CARE_PROVIDER_SITE_OTHER): Payer: Self-pay | Admitting: Vascular Surgery

## 2020-08-18 ENCOUNTER — Other Ambulatory Visit: Payer: Self-pay | Admitting: *Deleted

## 2020-08-18 DIAGNOSIS — N1831 Chronic kidney disease, stage 3a: Secondary | ICD-10-CM | POA: Diagnosis not present

## 2020-08-18 DIAGNOSIS — J9601 Acute respiratory failure with hypoxia: Secondary | ICD-10-CM | POA: Diagnosis not present

## 2020-08-18 DIAGNOSIS — J449 Chronic obstructive pulmonary disease, unspecified: Secondary | ICD-10-CM | POA: Diagnosis not present

## 2020-08-18 DIAGNOSIS — I130 Hypertensive heart and chronic kidney disease with heart failure and stage 1 through stage 4 chronic kidney disease, or unspecified chronic kidney disease: Secondary | ICD-10-CM | POA: Diagnosis not present

## 2020-08-18 DIAGNOSIS — E1122 Type 2 diabetes mellitus with diabetic chronic kidney disease: Secondary | ICD-10-CM | POA: Diagnosis not present

## 2020-08-18 DIAGNOSIS — E1151 Type 2 diabetes mellitus with diabetic peripheral angiopathy without gangrene: Secondary | ICD-10-CM | POA: Diagnosis not present

## 2020-08-18 DIAGNOSIS — I2510 Atherosclerotic heart disease of native coronary artery without angina pectoris: Secondary | ICD-10-CM | POA: Diagnosis not present

## 2020-08-18 DIAGNOSIS — I5033 Acute on chronic diastolic (congestive) heart failure: Secondary | ICD-10-CM | POA: Diagnosis not present

## 2020-08-18 DIAGNOSIS — I890 Lymphedema, not elsewhere classified: Secondary | ICD-10-CM | POA: Diagnosis not present

## 2020-08-18 DIAGNOSIS — N179 Acute kidney failure, unspecified: Secondary | ICD-10-CM | POA: Diagnosis not present

## 2020-08-18 DIAGNOSIS — I82431 Acute embolism and thrombosis of right popliteal vein: Secondary | ICD-10-CM

## 2020-08-18 DIAGNOSIS — L03115 Cellulitis of right lower limb: Secondary | ICD-10-CM | POA: Diagnosis not present

## 2020-08-18 DIAGNOSIS — I959 Hypotension, unspecified: Secondary | ICD-10-CM | POA: Diagnosis not present

## 2020-08-18 DIAGNOSIS — I872 Venous insufficiency (chronic) (peripheral): Secondary | ICD-10-CM | POA: Diagnosis not present

## 2020-08-18 NOTE — Patient Outreach (Signed)
[facility]) Care Management [facility] CM Multidisciplinary case conference 08/18/2020  Kreston Ahrendt Wubben 20-Jul-1951 [id number]  [facility] CM Multidisciplinary case conference re:  [name], 69 y/o male referred to [facility] CM by [facility] RN [facility] Liaison8/10/21for multiple recent hospital and ED visits.  Patient was recently hospitalized August 2-9, 2021 for acute respiratory failure with hypoxia/ CHF exacerbation with shortness of breath and significant peripheral edema. He was discharged home to self-care with home health services in place for PT/ OT. Patient has history including, but not limited to, dCHF; COPD; CKD-III; CAD; HTN/ HLD; DM' chronic pain; morbid obesity, pressure ulcer, and COVID-19 infection.  Patient re-presented to ED 07/20/20 for hypotension and [name] was re-admitted to hospital August 11-13, 2069for hypotension and AKI; he was again discharged home to self-care with established home health services in place.  Unfortunately, patient experienced third hospitalization August 24-29, 2021 for ongoing progressive shortness of breath; it was determined that he hada large saddle pulmonary embolism w/ large clot burden. Pt had a thrombectomy by vascular surgery on 08/03/20.  He was discharged home to self-care with ongoing/ established home health care.  [facility] CM Multidisciplinary case conference completed, with team discussion around patient's clinical status.  PCP office visit completed 08/16/20; patient continues working with [facility] team for medication assistance needs; care coordination efforts by [facility] CM team are ongoing and continue.  Patient very engaged and motivated thus far.  Supportive family, no community resource needs identified thus far.  Plan:  Continue to follow patient  Caryl Pina, RN, BSN, CCRN Alumnus [facility] Care Management  [phone]

## 2020-08-19 ENCOUNTER — Ambulatory Visit (INDEPENDENT_AMBULATORY_CARE_PROVIDER_SITE_OTHER): Payer: Medicare Other

## 2020-08-19 ENCOUNTER — Encounter (INDEPENDENT_AMBULATORY_CARE_PROVIDER_SITE_OTHER): Payer: Self-pay | Admitting: Nurse Practitioner

## 2020-08-19 ENCOUNTER — Ambulatory Visit (INDEPENDENT_AMBULATORY_CARE_PROVIDER_SITE_OTHER): Payer: Medicare Other | Admitting: Nurse Practitioner

## 2020-08-19 ENCOUNTER — Other Ambulatory Visit: Payer: Self-pay

## 2020-08-19 VITALS — BP 107/66 | HR 96 | Resp 16 | Wt >= 6400 oz

## 2020-08-19 DIAGNOSIS — I82431 Acute embolism and thrombosis of right popliteal vein: Secondary | ICD-10-CM | POA: Diagnosis not present

## 2020-08-19 DIAGNOSIS — I2602 Saddle embolus of pulmonary artery with acute cor pulmonale: Secondary | ICD-10-CM

## 2020-08-19 DIAGNOSIS — Z720 Tobacco use: Secondary | ICD-10-CM | POA: Diagnosis not present

## 2020-08-22 DIAGNOSIS — I890 Lymphedema, not elsewhere classified: Secondary | ICD-10-CM | POA: Diagnosis not present

## 2020-08-22 DIAGNOSIS — E1151 Type 2 diabetes mellitus with diabetic peripheral angiopathy without gangrene: Secondary | ICD-10-CM | POA: Diagnosis not present

## 2020-08-22 DIAGNOSIS — I872 Venous insufficiency (chronic) (peripheral): Secondary | ICD-10-CM | POA: Diagnosis not present

## 2020-08-22 DIAGNOSIS — I5033 Acute on chronic diastolic (congestive) heart failure: Secondary | ICD-10-CM | POA: Diagnosis not present

## 2020-08-22 DIAGNOSIS — J9601 Acute respiratory failure with hypoxia: Secondary | ICD-10-CM | POA: Diagnosis not present

## 2020-08-22 DIAGNOSIS — E1122 Type 2 diabetes mellitus with diabetic chronic kidney disease: Secondary | ICD-10-CM | POA: Diagnosis not present

## 2020-08-22 DIAGNOSIS — I130 Hypertensive heart and chronic kidney disease with heart failure and stage 1 through stage 4 chronic kidney disease, or unspecified chronic kidney disease: Secondary | ICD-10-CM | POA: Diagnosis not present

## 2020-08-22 DIAGNOSIS — I959 Hypotension, unspecified: Secondary | ICD-10-CM | POA: Diagnosis not present

## 2020-08-22 DIAGNOSIS — I2510 Atherosclerotic heart disease of native coronary artery without angina pectoris: Secondary | ICD-10-CM | POA: Diagnosis not present

## 2020-08-22 DIAGNOSIS — N179 Acute kidney failure, unspecified: Secondary | ICD-10-CM | POA: Diagnosis not present

## 2020-08-22 DIAGNOSIS — N1831 Chronic kidney disease, stage 3a: Secondary | ICD-10-CM | POA: Diagnosis not present

## 2020-08-22 DIAGNOSIS — J449 Chronic obstructive pulmonary disease, unspecified: Secondary | ICD-10-CM | POA: Diagnosis not present

## 2020-08-22 DIAGNOSIS — L03115 Cellulitis of right lower limb: Secondary | ICD-10-CM | POA: Diagnosis not present

## 2020-08-23 ENCOUNTER — Encounter (INDEPENDENT_AMBULATORY_CARE_PROVIDER_SITE_OTHER): Payer: Self-pay | Admitting: Nurse Practitioner

## 2020-08-23 NOTE — Progress Notes (Signed)
Subjective:    Patient ID: Thomas Tanner, male    DOB: 10-Dec-1951, 69 y.o.   MRN: [id number] Chief Complaint  Patient presents with  . Follow-up    [facility] 2wk post pulmonary thrombectomy    The patient is recovering following thrombectomy.  The patient notes that his breathing is much improved.  He is taking Eliquis without issue.   On 08/03/2020 the patient underwent:  Procedure(s) Performed:             1.  Contrast injection right heart             2.  Thrombolysis with 4 mg of TPA in the left pulmonary arteries and 4 mg of TPA on the right pulmonary arteries             3.  Mechanical thrombectomy using the penumbra CAT 12 device to the left lower lobe and left upper lobe pulmonary arteries well as mechanical thrombectomy using the penumbra CAT 12 device to the right lower lobe, right middle lobe, and right upper lobe pulmonary arteries             4.  Selective catheter placement right lower lobe, right middle lobe, and right upper lobe pulmonary arteries             5.  Selective catheter placement left lower lobe and left upper lobe pulmonary artery  The patient does endorse being very inactive prior to his pulmonary embolism.  Otherwise there has been no evidence of injury or hypercoagulability.  The patient notes that he has had less shortness of breath since his recent thrombectomy.  The patient does endorse continuing to have swelling of his lower extremity that is better with elevation.  He denies any signs symptoms of infection or cellulitis.  He denies any fever, chills nausea or vomiting.  The patient has not yet been utilizing medical grade 1 compression stockings.  The patient underwent noninvasive studies today which revealed no evidence of DVT remaining in the right lower extremity.   Review of Systems  Respiratory: Positive for shortness of breath.   Cardiovascular: Positive for leg swelling.  Neurological: Positive for weakness.  All other systems reviewed  and are negative.      Objective:   Physical Exam Vitals reviewed.  Constitutional:      Appearance: He is obese.  HENT:     Head: Normocephalic.  Cardiovascular:     Rate and Rhythm: Normal rate and regular rhythm.     Pulses: Normal pulses.     Heart sounds: Normal heart sounds.  Pulmonary:     Effort: Pulmonary effort is normal.  Skin:    General: Skin is warm.  Neurological:     Mental Status: He is alert and oriented to person, place, and time.  Psychiatric:        Mood and Affect: Mood normal.        Behavior: Behavior normal.        Thought Content: Thought content normal.        Judgment: Judgment normal.     BP 107/66 (BP Location: Left Arm)   Pulse 96   Resp 16   Wt (!) 420 lb (190.5 kg)   BMI 55.41 kg/m   Past Medical History:  Diagnosis Date  . Arthritis   . CHF (congestive heart failure) (HCC)   . COPD (chronic obstructive pulmonary disease) (HCC)   . Diabetes (HCC)   . Hyperlipidemia   . Hypertension   .  Kidney stone   . Obesity   . Tachycardia   . TIA (transient ischemic attack)     Social History   Socioeconomic History  . Marital status: Married    Spouse name: [name]   . Number of children: 3  . Years of education: Not on file  . Highest education level: Not on file  Occupational History  . Not on file  Tobacco Use  . Smoking status: Light Tobacco Smoker    Packs/day: 0.50    Types: Cigarettes    Last attempt to quit: 07/13/2020    Years since quitting: 0.1  . Smokeless tobacco: Never Used  . Tobacco comment: states "I quit 3 weeks ago."  Substance and Sexual Activity  . Alcohol use: No  . Drug use: No  . Sexual activity: Not Currently  Other Topics Concern  . Not on file  Social History Narrative   Lives at home with wife , youngest son, grand daughter and great greatson    Social Determinants of Health   Financial Resource Strain:   . Difficulty of Paying Living Expenses: Not on file  Food Insecurity: No Food  Insecurity  . Worried About Programme researcher, broadcasting/film/video in the Last Year: Never true  . Ran Out of Food in the Last Year: Never true  Transportation Needs: No Transportation Needs  . Lack of Transportation (Medical): No  . Lack of Transportation (Non-Medical): No  Physical Activity:   . Days of Exercise per Week: Not on file  . Minutes of Exercise per Session: Not on file  Stress:   . Feeling of Stress : Not on file  Social Connections:   . Frequency of Communication with Friends and Family: Not on file  . Frequency of Social Gatherings with Friends and Family: Not on file  . Attends Religious Services: Not on file  . Active Member of Clubs or Organizations: Not on file  . Attends Banker Meetings: Not on file  . Marital Status: Not on file  Intimate Partner Violence:   . Fear of Current or Ex-Partner: Not on file  . Emotionally Abused: Not on file  . Physically Abused: Not on file  . Sexually Abused: Not on file    Past Surgical History:  Procedure Laterality Date  . CORONARY/GRAFT ACUTE MI REVASCULARIZATION N/A 12/08/2018   Procedure: Coronary/Graft Acute MI Revascularization;  Surgeon: Alwyn Pea, MD;  Location: [facility] INVASIVE CV LAB;  Service: Cardiovascular;  Laterality: N/A;  . KIDNEY STONE SURGERY    . left arm surgery  1963  . LEFT HEART CATH AND CORONARY ANGIOGRAPHY N/A 12/08/2018   Procedure: LEFT HEART CATH AND CORONARY ANGIOGRAPHY;  Surgeon: Alwyn Pea, MD;  Location: [facility] INVASIVE CV LAB;  Service: Cardiovascular;  Laterality: N/A;  . PULMONARY THROMBECTOMY N/A 08/03/2020   Procedure: PULMONARY THROMBECTOMY / THROMBOLYSIS;  Surgeon: Annice Needy, MD;  Location: [facility] INVASIVE CV LAB;  Service: Cardiovascular;  Laterality: N/A;    Family History  Problem Relation Age of Onset  . Alzheimer's disease Mother   . CAD Father     Allergies  Allergen Reactions  . Morphine Anxiety and Other (See Comments)    Hallucinations  . Morphine And Related  Anxiety       Assessment & Plan:   1. Acute saddle pulmonary embolism with acute cor pulmonale (HCC) Based on the size of the patient's pulmonary embolism in addition to the fact that he is still not extremely active, we will plan  on keeping the patient on anticoagulation for at least 1 year.  I discussed with the patient that following this year we may discuss remaining on Eliquis versus moving to a prophylactic dose.  Barring any issues, the patient will follow up in our office in 1 year. 2. Acute deep vein thrombosis (DVT) of popliteal vein of right lower extremity (HCC) Today the patient's noninvasive studies show no evidence of DVT remaining in his right lower extremity.  The patient is advised to postphlebitic swelling and discomfort.  The patient is advised that he should wear medical grade 1 compression stocking of 20 to 30 mmHg to control swelling.  Elevation is also going to be very helpful.  The patient should exercise as this will also help to control edema in addition to help with weight loss.  Weight loss will also be extremely helpful with controlling the patient's edema.  Patient will continue on Eliquis as described above.  3. Tobacco abuse Smoking cessation was discussed, 3-10 minutes spent on this topic specifically    Current Outpatient Medications on File Prior to Visit  Medication Sig Dispense Refill  . apixaban (ELIQUIS) 5 MG TABS tablet Take 1 tablet (5 mg total) by mouth 2 (two) times daily. 180 tablet 1  . famotidine (PEPCID) 20 MG tablet Take 1 tablet (20 mg total) by mouth 2 (two) times daily. (Patient taking differently: Take 20 mg by mouth daily. ) 180 tablet 1  . furosemide (LASIX) 20 MG tablet Take 1 tablet (20 mg total) by mouth daily. 30 tablet 0  . Krill Oil (OMEGA-3) 500 MG CAPS Take 1 tab po daily (Patient taking differently: Take 500 mg by mouth daily. ) 90 capsule 3  . lisinopril (ZESTRIL) 10 MG tablet Take 1 tablet (10 mg total) by mouth daily. 30 tablet  1  . metoprolol succinate (TOPROL-XL) 50 MG 24 hr tablet Take 1 tablet (50 mg total) by mouth daily. 90 tablet 1  . nicotine (NICODERM CQ - DOSED IN MG/24 HOURS) 21 mg/24hr patch Place 1 patch (21 mg total) onto the skin daily. 28 patch 0  . rosuvastatin (CRESTOR) 20 MG tablet Take 1 tablet (20 mg total) by mouth daily. 90 tablet 1  . traMADol (ULTRAM) 50 MG tablet Take 1 tablet (50 mg total) by mouth every 8 (eight) hours as needed. (Patient taking differently: Take 50 mg by mouth every 8 (eight) hours as needed for moderate pain. ) 45 tablet 0  . VENTOLIN HFA 108 (90 Base) MCG/ACT inhaler Inhale 2 puffs into the lungs every 6 (six) hours as needed for wheezing or shortness of breath. 48 g 1   No current facility-administered medications on file prior to visit.    There are no Patient Instructions on file for this visit. No follow-ups on file.   Georgiana Spinner, NP

## 2020-08-24 DIAGNOSIS — I2510 Atherosclerotic heart disease of native coronary artery without angina pectoris: Secondary | ICD-10-CM | POA: Diagnosis not present

## 2020-08-24 DIAGNOSIS — N179 Acute kidney failure, unspecified: Secondary | ICD-10-CM | POA: Diagnosis not present

## 2020-08-24 DIAGNOSIS — I890 Lymphedema, not elsewhere classified: Secondary | ICD-10-CM | POA: Diagnosis not present

## 2020-08-24 DIAGNOSIS — N1831 Chronic kidney disease, stage 3a: Secondary | ICD-10-CM | POA: Diagnosis not present

## 2020-08-24 DIAGNOSIS — E1151 Type 2 diabetes mellitus with diabetic peripheral angiopathy without gangrene: Secondary | ICD-10-CM | POA: Diagnosis not present

## 2020-08-24 DIAGNOSIS — J449 Chronic obstructive pulmonary disease, unspecified: Secondary | ICD-10-CM | POA: Diagnosis not present

## 2020-08-24 DIAGNOSIS — I5033 Acute on chronic diastolic (congestive) heart failure: Secondary | ICD-10-CM | POA: Diagnosis not present

## 2020-08-24 DIAGNOSIS — L03115 Cellulitis of right lower limb: Secondary | ICD-10-CM | POA: Diagnosis not present

## 2020-08-24 DIAGNOSIS — E1122 Type 2 diabetes mellitus with diabetic chronic kidney disease: Secondary | ICD-10-CM | POA: Diagnosis not present

## 2020-08-24 DIAGNOSIS — I959 Hypotension, unspecified: Secondary | ICD-10-CM | POA: Diagnosis not present

## 2020-08-24 DIAGNOSIS — I130 Hypertensive heart and chronic kidney disease with heart failure and stage 1 through stage 4 chronic kidney disease, or unspecified chronic kidney disease: Secondary | ICD-10-CM | POA: Diagnosis not present

## 2020-08-24 DIAGNOSIS — I872 Venous insufficiency (chronic) (peripheral): Secondary | ICD-10-CM | POA: Diagnosis not present

## 2020-08-24 DIAGNOSIS — J9601 Acute respiratory failure with hypoxia: Secondary | ICD-10-CM | POA: Diagnosis not present

## 2020-08-29 ENCOUNTER — Other Ambulatory Visit: Payer: Self-pay

## 2020-08-29 DIAGNOSIS — I2510 Atherosclerotic heart disease of native coronary artery without angina pectoris: Secondary | ICD-10-CM | POA: Diagnosis not present

## 2020-08-29 DIAGNOSIS — I890 Lymphedema, not elsewhere classified: Secondary | ICD-10-CM | POA: Diagnosis not present

## 2020-08-29 DIAGNOSIS — J9601 Acute respiratory failure with hypoxia: Secondary | ICD-10-CM | POA: Diagnosis not present

## 2020-08-29 DIAGNOSIS — I872 Venous insufficiency (chronic) (peripheral): Secondary | ICD-10-CM | POA: Diagnosis not present

## 2020-08-29 DIAGNOSIS — J449 Chronic obstructive pulmonary disease, unspecified: Secondary | ICD-10-CM | POA: Diagnosis not present

## 2020-08-29 DIAGNOSIS — N179 Acute kidney failure, unspecified: Secondary | ICD-10-CM | POA: Diagnosis not present

## 2020-08-29 DIAGNOSIS — N1831 Chronic kidney disease, stage 3a: Secondary | ICD-10-CM | POA: Diagnosis not present

## 2020-08-29 DIAGNOSIS — I5033 Acute on chronic diastolic (congestive) heart failure: Secondary | ICD-10-CM | POA: Diagnosis not present

## 2020-08-29 DIAGNOSIS — L03115 Cellulitis of right lower limb: Secondary | ICD-10-CM | POA: Diagnosis not present

## 2020-08-29 DIAGNOSIS — K219 Gastro-esophageal reflux disease without esophagitis: Secondary | ICD-10-CM

## 2020-08-29 DIAGNOSIS — I959 Hypotension, unspecified: Secondary | ICD-10-CM | POA: Diagnosis not present

## 2020-08-29 DIAGNOSIS — E1151 Type 2 diabetes mellitus with diabetic peripheral angiopathy without gangrene: Secondary | ICD-10-CM | POA: Diagnosis not present

## 2020-08-29 DIAGNOSIS — E1122 Type 2 diabetes mellitus with diabetic chronic kidney disease: Secondary | ICD-10-CM | POA: Diagnosis not present

## 2020-08-29 DIAGNOSIS — I10 Essential (primary) hypertension: Secondary | ICD-10-CM

## 2020-08-29 DIAGNOSIS — I130 Hypertensive heart and chronic kidney disease with heart failure and stage 1 through stage 4 chronic kidney disease, or unspecified chronic kidney disease: Secondary | ICD-10-CM | POA: Diagnosis not present

## 2020-08-30 ENCOUNTER — Telehealth: Payer: Self-pay

## 2020-08-30 ENCOUNTER — Encounter: Payer: Self-pay | Admitting: *Deleted

## 2020-08-30 ENCOUNTER — Other Ambulatory Visit: Payer: Self-pay

## 2020-08-30 ENCOUNTER — Other Ambulatory Visit: Payer: Self-pay | Admitting: *Deleted

## 2020-08-30 DIAGNOSIS — F17200 Nicotine dependence, unspecified, uncomplicated: Secondary | ICD-10-CM | POA: Diagnosis not present

## 2020-08-30 DIAGNOSIS — I10 Essential (primary) hypertension: Secondary | ICD-10-CM | POA: Diagnosis not present

## 2020-08-30 DIAGNOSIS — I2510 Atherosclerotic heart disease of native coronary artery without angina pectoris: Secondary | ICD-10-CM | POA: Diagnosis not present

## 2020-08-30 DIAGNOSIS — I2102 ST elevation (STEMI) myocardial infarction involving left anterior descending coronary artery: Secondary | ICD-10-CM | POA: Diagnosis not present

## 2020-08-30 DIAGNOSIS — K219 Gastro-esophageal reflux disease without esophagitis: Secondary | ICD-10-CM

## 2020-08-30 MED ORDER — FAMOTIDINE 20 MG PO TABS: 20.0000 mg | ORAL_TABLET | Freq: Two times a day (BID) | ORAL | 1 refills | Status: DC

## 2020-08-30 MED ORDER — METOPROLOL SUCCINATE ER 50 MG PO TB24: 50.0000 mg | ORAL_TABLET | Freq: Every day | ORAL | 1 refills | Status: DC

## 2020-08-30 NOTE — Patient Outreach (Signed)
[facility] Customer service manager [facility]) Care Management [facility] CM Telephone Outreach PCP office completes Transition of Care follow up post-hospital discharge Post-hospital discharge day # 23 without unplanned hospital re-admission  08/30/2020  Thomas Tanner 1951-08-28 [id number]  Successfultelephone outreach attempt to  Thomas Tanner, 69 y/o male referred to [facility] CM by [facility] RN [facility] Liaison8/10/21for multiple recent hospital and ED visits.  Patient was recently hospitalized August 2-9, 2021 for acute respiratory failure with hypoxia/ CHF exacerbation with shortness of breath and significant peripheral edema. He was discharged home to self-care with home health services in place for PT/ OT. Patient has history including, but not limited to, dCHF; COPD; CKD-III; CAD; HTN/ HLD; DM' chronic pain; morbid obesity, pressure ulcer, and COVID-19 infection.  Patient re-presented to ED 07/20/20 for hypotension and Thomas Tanner was re-admitted to hospital August 11-13, 2052for hypotension and AKI; he was again discharged home to self-care with established home health services in place.  Unfortunately, patient experienced third hospitalization August 24-29, 2021 for ongoing progressive shortness of breath; it was determined that he hada large saddle pulmonary embolism w/ large clot burden. Pt had a thrombectomy by vascular surgery on 08/03/20.  HIPAA/ identity verified;patient reports, "doing very well," and states he has no current concerns around his overall health status; reports continues feeling better after recent hospitalizations and again expresses gratitude that he is finally getting better.  He sounds to be in no distress throughout phone call today.  -- discussed current clinical condition with patient and confirmed no current clinical concerns -- discussed use of home O2; confirmed patient using intermittently -- reviewed SaO2 and BP's at home: SaO2 93-94% on room air; BP's  100-110/50-60 -- reviewed recent and upcoming provider appointments; confirmed no transportation concerns -- reviewed weight gain guidelines in setting of CHF with patient and provided education around early vs. Late signs/ symptoms CHF yellow zone: encouraged patient to follow his plan to contact [facility] Clinic to follow up on status of bariatric scales they have ordered for him -- confirmed no recent changes to medications and that patient has and is taking all as prescribed -- confirmed home health services remain active; encouraged patient's ongoing active participation -- confirmed that patient has spoken to [facility] team regarding patient assistance with inhalers and Eliquis -- discussed with patient his uncertainty around obtaining flu and corona virus vaccines and encouraged him to discuss with care providers promptly and to consider options carefully -- reiterated previously provided education/ support around smoking cessation- patient continues smoking 4-5 cigarettes per day  Patient denies further issues, concerns, or problems today. Iconfirmed that patient hasmy direct phone number, the main [facility] CM office phone number, and the [facility] CM 24-hour nurse advice phone number should issues arise prior to next scheduled [facility] CM outreach next month. Encouraged patient to contact me directly if needs, questions, issues, or concerns arise prior to next scheduled outreach; patient agreed to do so.  Plan:  Patient will take medications as prescribed and will attend all scheduled provider appointments  Patient will promptly notify care providers for any new concerns/ issues/ problems that arise  Patient will actively participate in home health services as ordered post-hospital discharge  [facility] CM outreach to continue with scheduled phone callnext month, post- upcoming scheduled provider appointments, unless indicated sooner  Caryl Pina, RN, BSN, [employer]  Coordinator [facility] Care Management  [phone]

## 2020-08-30 NOTE — Telephone Encounter (Signed)
lmom to call us back regarding med refills  

## 2020-08-31 DIAGNOSIS — E1151 Type 2 diabetes mellitus with diabetic peripheral angiopathy without gangrene: Secondary | ICD-10-CM | POA: Diagnosis not present

## 2020-08-31 DIAGNOSIS — I959 Hypotension, unspecified: Secondary | ICD-10-CM | POA: Diagnosis not present

## 2020-08-31 DIAGNOSIS — I130 Hypertensive heart and chronic kidney disease with heart failure and stage 1 through stage 4 chronic kidney disease, or unspecified chronic kidney disease: Secondary | ICD-10-CM | POA: Diagnosis not present

## 2020-08-31 DIAGNOSIS — N1831 Chronic kidney disease, stage 3a: Secondary | ICD-10-CM | POA: Diagnosis not present

## 2020-08-31 DIAGNOSIS — J9601 Acute respiratory failure with hypoxia: Secondary | ICD-10-CM | POA: Diagnosis not present

## 2020-08-31 DIAGNOSIS — L03115 Cellulitis of right lower limb: Secondary | ICD-10-CM | POA: Diagnosis not present

## 2020-08-31 DIAGNOSIS — I2510 Atherosclerotic heart disease of native coronary artery without angina pectoris: Secondary | ICD-10-CM | POA: Diagnosis not present

## 2020-08-31 DIAGNOSIS — E1122 Type 2 diabetes mellitus with diabetic chronic kidney disease: Secondary | ICD-10-CM | POA: Diagnosis not present

## 2020-08-31 DIAGNOSIS — N179 Acute kidney failure, unspecified: Secondary | ICD-10-CM | POA: Diagnosis not present

## 2020-08-31 DIAGNOSIS — I872 Venous insufficiency (chronic) (peripheral): Secondary | ICD-10-CM | POA: Diagnosis not present

## 2020-08-31 DIAGNOSIS — I5033 Acute on chronic diastolic (congestive) heart failure: Secondary | ICD-10-CM | POA: Diagnosis not present

## 2020-08-31 DIAGNOSIS — J449 Chronic obstructive pulmonary disease, unspecified: Secondary | ICD-10-CM | POA: Diagnosis not present

## 2020-08-31 DIAGNOSIS — I890 Lymphedema, not elsewhere classified: Secondary | ICD-10-CM | POA: Diagnosis not present

## 2020-09-01 DIAGNOSIS — U071 COVID-19: Secondary | ICD-10-CM | POA: Diagnosis not present

## 2020-09-01 DIAGNOSIS — R531 Weakness: Secondary | ICD-10-CM | POA: Diagnosis not present

## 2020-09-07 DIAGNOSIS — I959 Hypotension, unspecified: Secondary | ICD-10-CM | POA: Diagnosis not present

## 2020-09-07 DIAGNOSIS — I5033 Acute on chronic diastolic (congestive) heart failure: Secondary | ICD-10-CM | POA: Diagnosis not present

## 2020-09-07 DIAGNOSIS — E1151 Type 2 diabetes mellitus with diabetic peripheral angiopathy without gangrene: Secondary | ICD-10-CM | POA: Diagnosis not present

## 2020-09-07 DIAGNOSIS — I890 Lymphedema, not elsewhere classified: Secondary | ICD-10-CM | POA: Diagnosis not present

## 2020-09-07 DIAGNOSIS — E1122 Type 2 diabetes mellitus with diabetic chronic kidney disease: Secondary | ICD-10-CM | POA: Diagnosis not present

## 2020-09-07 DIAGNOSIS — J9601 Acute respiratory failure with hypoxia: Secondary | ICD-10-CM | POA: Diagnosis not present

## 2020-09-07 DIAGNOSIS — L03115 Cellulitis of right lower limb: Secondary | ICD-10-CM | POA: Diagnosis not present

## 2020-09-07 DIAGNOSIS — N1831 Chronic kidney disease, stage 3a: Secondary | ICD-10-CM | POA: Diagnosis not present

## 2020-09-07 DIAGNOSIS — I130 Hypertensive heart and chronic kidney disease with heart failure and stage 1 through stage 4 chronic kidney disease, or unspecified chronic kidney disease: Secondary | ICD-10-CM | POA: Diagnosis not present

## 2020-09-07 DIAGNOSIS — I2510 Atherosclerotic heart disease of native coronary artery without angina pectoris: Secondary | ICD-10-CM | POA: Diagnosis not present

## 2020-09-07 DIAGNOSIS — N179 Acute kidney failure, unspecified: Secondary | ICD-10-CM | POA: Diagnosis not present

## 2020-09-07 DIAGNOSIS — J449 Chronic obstructive pulmonary disease, unspecified: Secondary | ICD-10-CM | POA: Diagnosis not present

## 2020-09-07 DIAGNOSIS — I872 Venous insufficiency (chronic) (peripheral): Secondary | ICD-10-CM | POA: Diagnosis not present

## 2020-09-13 DIAGNOSIS — I130 Hypertensive heart and chronic kidney disease with heart failure and stage 1 through stage 4 chronic kidney disease, or unspecified chronic kidney disease: Secondary | ICD-10-CM | POA: Diagnosis not present

## 2020-09-13 DIAGNOSIS — I959 Hypotension, unspecified: Secondary | ICD-10-CM | POA: Diagnosis not present

## 2020-09-13 DIAGNOSIS — I890 Lymphedema, not elsewhere classified: Secondary | ICD-10-CM | POA: Diagnosis not present

## 2020-09-13 DIAGNOSIS — E1151 Type 2 diabetes mellitus with diabetic peripheral angiopathy without gangrene: Secondary | ICD-10-CM | POA: Diagnosis not present

## 2020-09-13 DIAGNOSIS — I872 Venous insufficiency (chronic) (peripheral): Secondary | ICD-10-CM | POA: Diagnosis not present

## 2020-09-13 DIAGNOSIS — N1831 Chronic kidney disease, stage 3a: Secondary | ICD-10-CM | POA: Diagnosis not present

## 2020-09-13 DIAGNOSIS — E1122 Type 2 diabetes mellitus with diabetic chronic kidney disease: Secondary | ICD-10-CM | POA: Diagnosis not present

## 2020-09-13 DIAGNOSIS — N179 Acute kidney failure, unspecified: Secondary | ICD-10-CM | POA: Diagnosis not present

## 2020-09-13 DIAGNOSIS — L03115 Cellulitis of right lower limb: Secondary | ICD-10-CM | POA: Diagnosis not present

## 2020-09-13 DIAGNOSIS — I2510 Atherosclerotic heart disease of native coronary artery without angina pectoris: Secondary | ICD-10-CM | POA: Diagnosis not present

## 2020-09-13 DIAGNOSIS — I5033 Acute on chronic diastolic (congestive) heart failure: Secondary | ICD-10-CM | POA: Diagnosis not present

## 2020-09-13 DIAGNOSIS — J9601 Acute respiratory failure with hypoxia: Secondary | ICD-10-CM | POA: Diagnosis not present

## 2020-09-13 DIAGNOSIS — J449 Chronic obstructive pulmonary disease, unspecified: Secondary | ICD-10-CM | POA: Diagnosis not present

## 2020-09-15 ENCOUNTER — Other Ambulatory Visit: Payer: Self-pay

## 2020-09-15 MED ORDER — FUROSEMIDE 20 MG PO TABS
20.0000 mg | ORAL_TABLET | Freq: Every day | ORAL | 0 refills | Status: DC
Start: 2020-09-15 — End: 2020-10-14

## 2020-09-20 ENCOUNTER — Telehealth: Payer: Self-pay

## 2020-09-20 DIAGNOSIS — L03115 Cellulitis of right lower limb: Secondary | ICD-10-CM | POA: Diagnosis not present

## 2020-09-20 DIAGNOSIS — I890 Lymphedema, not elsewhere classified: Secondary | ICD-10-CM | POA: Diagnosis not present

## 2020-09-20 DIAGNOSIS — J9601 Acute respiratory failure with hypoxia: Secondary | ICD-10-CM | POA: Diagnosis not present

## 2020-09-20 DIAGNOSIS — N1831 Chronic kidney disease, stage 3a: Secondary | ICD-10-CM | POA: Diagnosis not present

## 2020-09-20 DIAGNOSIS — J449 Chronic obstructive pulmonary disease, unspecified: Secondary | ICD-10-CM | POA: Diagnosis not present

## 2020-09-20 DIAGNOSIS — I130 Hypertensive heart and chronic kidney disease with heart failure and stage 1 through stage 4 chronic kidney disease, or unspecified chronic kidney disease: Secondary | ICD-10-CM | POA: Diagnosis not present

## 2020-09-20 DIAGNOSIS — E1151 Type 2 diabetes mellitus with diabetic peripheral angiopathy without gangrene: Secondary | ICD-10-CM | POA: Diagnosis not present

## 2020-09-20 DIAGNOSIS — I959 Hypotension, unspecified: Secondary | ICD-10-CM | POA: Diagnosis not present

## 2020-09-20 DIAGNOSIS — I872 Venous insufficiency (chronic) (peripheral): Secondary | ICD-10-CM | POA: Diagnosis not present

## 2020-09-20 DIAGNOSIS — E1122 Type 2 diabetes mellitus with diabetic chronic kidney disease: Secondary | ICD-10-CM | POA: Diagnosis not present

## 2020-09-20 DIAGNOSIS — N179 Acute kidney failure, unspecified: Secondary | ICD-10-CM | POA: Diagnosis not present

## 2020-09-20 DIAGNOSIS — I5033 Acute on chronic diastolic (congestive) heart failure: Secondary | ICD-10-CM | POA: Diagnosis not present

## 2020-09-20 DIAGNOSIS — I2510 Atherosclerotic heart disease of native coronary artery without angina pectoris: Secondary | ICD-10-CM | POA: Diagnosis not present

## 2020-09-20 NOTE — Telephone Encounter (Signed)
Gave verbal order for reverification for physical therapy for once a week for 1 week ,twice a week for 5 weeks and once a week for 3 week to [facility] physical therapist [phone]

## 2020-09-21 ENCOUNTER — Other Ambulatory Visit: Payer: Self-pay

## 2020-09-21 MED ORDER — LISINOPRIL 10 MG PO TABS
10.0000 mg | ORAL_TABLET | Freq: Every day | ORAL | 1 refills | Status: DC
Start: 2020-09-21 — End: 2020-11-16

## 2020-09-28 ENCOUNTER — Ambulatory Visit (INDEPENDENT_AMBULATORY_CARE_PROVIDER_SITE_OTHER): Payer: Medicare Other | Admitting: Hospice and Palliative Medicine

## 2020-09-28 ENCOUNTER — Encounter: Payer: Self-pay | Admitting: Hospice and Palliative Medicine

## 2020-09-28 ENCOUNTER — Telehealth: Payer: Self-pay

## 2020-09-28 DIAGNOSIS — M5441 Lumbago with sciatica, right side: Secondary | ICD-10-CM

## 2020-09-28 DIAGNOSIS — F17210 Nicotine dependence, cigarettes, uncomplicated: Secondary | ICD-10-CM

## 2020-09-28 DIAGNOSIS — I700 Atherosclerosis of aorta: Secondary | ICD-10-CM

## 2020-09-28 NOTE — Progress Notes (Signed)
[facility] [address] [place], [place] [address]  Internal MEDICINE  Telephone Visit  Patient Name: Thomas Tanner  [id number]  [id number]  Date of Service: 10/01/2020  I connected with the patient at 1645 by telephone and verified the patients identity using two identifiers.   I discussed the limitations, risks, security and privacy concerns of performing an evaluation and management service by telephone and the availability of in person appointments. I also discussed with the patient that there may be a patient responsible charge related to the service.  The patient expressed understanding and agrees to proceed.    Chief Complaint  Patient presents with  . Telephone Screen  . Telephone Assessment  . Follow-up  . Hypertension  . Hyperlipidemia  . Diabetes    HPI Patient is being seen today for follow-up visit Saw Dr. Wyn Quaker DVT has dissolved, will leave on eliquis for one year Dr. Delora Fuel is going good--he was encouraged to quit smoking and to lose to weight He is being followed by heart failure clinic  He is wearing his oxygen intermittently during the day 2.5LPM he does wear it every night  Complains today of right hip and knee pain--he says this happens when the weather changes due to arthritis He is being followed by PT and doing well--would like to be able to exercise more but feels limited by pain  Current Medication: Outpatient Encounter Medications as of 09/28/2020  Medication Sig Note  . apixaban (ELIQUIS) 5 MG TABS tablet Take 1 tablet (5 mg total) by mouth 2 (two) times daily.   . famotidine (PEPCID) 20 MG tablet Take 1 tablet (20 mg total) by mouth 2 (two) times daily.   . furosemide (LASIX) 20 MG tablet Take 1 tablet (20 mg total) by mouth daily.   Boris Lown Oil (OMEGA-3) 500 MG CAPS Take 1 tab po daily (Patient taking differently: Take 500 mg by mouth daily. )   . lisinopril (ZESTRIL) 10 MG tablet Take 1 tablet (10 mg total) by  mouth daily.   . metoprolol succinate (TOPROL-XL) 50 MG 24 hr tablet Take 1 tablet (50 mg total) by mouth daily.   . nicotine (NICODERM CQ - DOSED IN MG/24 HOURS) 21 mg/24hr patch Place 1 patch (21 mg total) onto the skin daily. 08/12/2020: Reports to start later; counseled around being completely quit smoking prior to starting- patient reports he is aware  . rosuvastatin (CRESTOR) 20 MG tablet Take 1 tablet (20 mg total) by mouth daily.   . traMADol (ULTRAM) 50 MG tablet Take 1 tablet (50 mg total) by mouth every 8 (eight) hours as needed. (Patient taking differently: Take 50 mg by mouth every 8 (eight) hours as needed for moderate pain. ) 08/12/2020: Uses as needed  . VENTOLIN HFA 108 (90 Base) MCG/ACT inhaler Inhale 2 puffs into the lungs every 6 (six) hours as needed for wheezing or shortness of breath. 08/12/2020: Reports uses prn- reports uses 1-2 times per day   No facility-administered encounter medications on file as of 09/28/2020.    Surgical History: Past Surgical History:  Procedure Laterality Date  . CORONARY/GRAFT ACUTE MI REVASCULARIZATION N/A 12/08/2018   Procedure: Coronary/Graft Acute MI Revascularization;  Surgeon: Alwyn Pea, MD;  Location: [facility] INVASIVE CV LAB;  Service: Cardiovascular;  Laterality: N/A;  . KIDNEY STONE SURGERY    . left arm surgery  1963  . LEFT HEART CATH AND CORONARY ANGIOGRAPHY N/A 12/08/2018   Procedure: LEFT HEART CATH AND CORONARY ANGIOGRAPHY;  Surgeon:  Callwood, Dwayne D, MD;  Location: [facility] INVASIVE CV LAB;  Service: Cardiovascular;  Laterality: N/A;  . PULMONARY THROMBECTOMY N/A 08/03/2020   Procedure: PULMONARY THROMBECTOMY / THROMBOLYSIS;  Surgeon: Annice Needy, MD;  Location: [facility] INVASIVE CV LAB;  Service: Cardiovascular;  Laterality: N/A;    Medical History: Past Medical History:  Diagnosis Date  . Arthritis   . CHF (congestive heart failure) (HCC)   . COPD (chronic obstructive pulmonary disease) (HCC)   . Diabetes (HCC)   .  Hyperlipidemia   . Hypertension   . Kidney stone   . Obesity   . Tachycardia   . TIA (transient ischemic attack)     Family History: Family History  Problem Relation Age of Onset  . Alzheimer's disease Mother   . CAD Father     Social History   Socioeconomic History  . Marital status: Married    Spouse name: [name]   . Number of children: 3  . Years of education: Not on file  . Highest education level: Not on file  Occupational History  . Not on file  Tobacco Use  . Smoking status: Light Tobacco Smoker    Packs/day: 0.50    Types: Cigarettes    Last attempt to quit: 07/13/2020    Years since quitting: 0.2  . Smokeless tobacco: Never Used  . Tobacco comment: states "I quit 3 weeks ago."  Substance and Sexual Activity  . Alcohol use: No  . Drug use: No  . Sexual activity: Not Currently  Other Topics Concern  . Not on file  Social History Narrative   Lives at home with wife , youngest son, grand daughter and great greatson    Social Determinants of Health   Financial Resource Strain:   . Difficulty of Paying Living Expenses: Not on file  Food Insecurity: No Food Insecurity  . Worried About Programme researcher, broadcasting/film/video in the Last Year: Never true  . Ran Out of Food in the Last Year: Never true  Transportation Needs: No Transportation Needs  . Lack of Transportation (Medical): No  . Lack of Transportation (Non-Medical): No  Physical Activity:   . Days of Exercise per Week: Not on file  . Minutes of Exercise per Session: Not on file  Stress:   . Feeling of Stress : Not on file  Social Connections:   . Frequency of Communication with Friends and Family: Not on file  . Frequency of Social Gatherings with Friends and Family: Not on file  . Attends Religious Services: Not on file  . Active Member of Clubs or Organizations: Not on file  . Attends Banker Meetings: Not on file  . Marital Status: Not on file  Intimate Partner Violence:   . Fear of Current or  Ex-Partner: Not on file  . Emotionally Abused: Not on file  . Physically Abused: Not on file  . Sexually Abused: Not on file   Review of Systems  Constitutional: Negative for chills, fatigue and unexpected weight change.  HENT: Negative for congestion, postnasal drip, rhinorrhea, sneezing and sore throat.   Eyes: Negative for redness.  Respiratory: Negative for cough, chest tightness and shortness of breath.   Cardiovascular: Negative for chest pain, palpitations and leg swelling.  Gastrointestinal: Negative for abdominal pain, constipation, diarrhea, nausea and vomiting.  Genitourinary: Negative for dysuria and frequency.  Musculoskeletal: Positive for arthralgias and back pain. Negative for joint swelling and neck pain.  Skin: Negative for rash.  Neurological: Negative for tremors and  numbness.  Hematological: Negative for adenopathy. Does not bruise/bleed easily.  Psychiatric/Behavioral: Negative for behavioral problems (Depression), sleep disturbance and suicidal ideas. The patient is not nervous/anxious.     Vital Signs: BP (!) 116/50   Pulse 89   Resp 16   Ht 6\' 1"  (1.854 m)   Wt (!) 420 lb (190.5 kg)   SpO2 94%   BMI 55.41 kg/m    Observation/Objective: No acute distress noted   Assessment/Plan: 1. Atherosclerosis of aorta (HCC) Continue with statin therapy as well as anticoagulation, will continue with periodic monitoring  2. Cigarette smoker motivated to quit Further encouragement to achieve complete smoking cessation, not interested in medication therapy at this time Smoking cessation counseling: 1. Pt acknowledges the risks of long term smoking, she will try to quite smoking. 2. Options for different medications including nicotine products, chewing gum, patch etc, Wellbutrin and Chantix is discussed 3. Goal and date of compete cessation is discussed 4. Total time spent in smoking cessation is 15 min.   3. Low back pain with right-sided sciatica, unspecified  back pain laterality, unspecified chronicity Continue with PT--would like to try sample of voltaren gel to help manage pain  4. Morbid obesity (HCC) Discussed the importance of weight management through healthy eating and low intensity exercise as tolerated Discussed the negative effects obesity has on his cardiac, pulmonary and overall health Obesity Counseling: Risk Assessment: An assessment of behavioral risk factors was made today and includes lack of exercise sedentary lifestyle, lack of portion control and poor dietary habits.  Risk Modification Advice: She was counseled on portion control guidelines. Restricting daily caloric intake. The detrimental long term effects of obesity on her health and ongoing poor compliance was also discussed with the patient.  General Counseling: Jahzeel verbalizes understanding of the findings of today's phone visit and agrees with plan of treatment. I have discussed any further diagnostic evaluation that may be needed or ordered today. We also reviewed his medications today. he has been encouraged to call the office with any questions or concerns that should arise related to todays visit.   Time spent: 30 Minutes Time spent includes review of chart, medications, test results and follow-up plan with the patient.  Arelyn Gauer AGNP-C Internal medicine

## 2020-09-28 NOTE — Telephone Encounter (Signed)
Verbal order,plan of care and post hospital order signed and placed in [facility] folder, ready for pickup.

## 2020-09-30 DIAGNOSIS — I130 Hypertensive heart and chronic kidney disease with heart failure and stage 1 through stage 4 chronic kidney disease, or unspecified chronic kidney disease: Secondary | ICD-10-CM | POA: Diagnosis not present

## 2020-09-30 DIAGNOSIS — I2699 Other pulmonary embolism without acute cor pulmonale: Secondary | ICD-10-CM | POA: Diagnosis not present

## 2020-09-30 DIAGNOSIS — E1122 Type 2 diabetes mellitus with diabetic chronic kidney disease: Secondary | ICD-10-CM | POA: Diagnosis not present

## 2020-09-30 DIAGNOSIS — J441 Chronic obstructive pulmonary disease with (acute) exacerbation: Secondary | ICD-10-CM | POA: Diagnosis not present

## 2020-09-30 DIAGNOSIS — I5033 Acute on chronic diastolic (congestive) heart failure: Secondary | ICD-10-CM | POA: Diagnosis not present

## 2020-09-30 DIAGNOSIS — I890 Lymphedema, not elsewhere classified: Secondary | ICD-10-CM | POA: Diagnosis not present

## 2020-09-30 DIAGNOSIS — J9621 Acute and chronic respiratory failure with hypoxia: Secondary | ICD-10-CM | POA: Diagnosis not present

## 2020-09-30 DIAGNOSIS — I82431 Acute embolism and thrombosis of right popliteal vein: Secondary | ICD-10-CM | POA: Diagnosis not present

## 2020-09-30 DIAGNOSIS — I959 Hypotension, unspecified: Secondary | ICD-10-CM | POA: Diagnosis not present

## 2020-09-30 DIAGNOSIS — N1831 Chronic kidney disease, stage 3a: Secondary | ICD-10-CM | POA: Diagnosis not present

## 2020-09-30 DIAGNOSIS — I872 Venous insufficiency (chronic) (peripheral): Secondary | ICD-10-CM | POA: Diagnosis not present

## 2020-09-30 DIAGNOSIS — I2510 Atherosclerotic heart disease of native coronary artery without angina pectoris: Secondary | ICD-10-CM | POA: Diagnosis not present

## 2020-09-30 DIAGNOSIS — E1151 Type 2 diabetes mellitus with diabetic peripheral angiopathy without gangrene: Secondary | ICD-10-CM | POA: Diagnosis not present

## 2020-10-01 ENCOUNTER — Encounter: Payer: Self-pay | Admitting: Hospice and Palliative Medicine

## 2020-10-01 DIAGNOSIS — U071 COVID-19: Secondary | ICD-10-CM | POA: Diagnosis not present

## 2020-10-01 DIAGNOSIS — R531 Weakness: Secondary | ICD-10-CM | POA: Diagnosis not present

## 2020-10-03 ENCOUNTER — Ambulatory Visit: Payer: Medicare Other | Admitting: Family

## 2020-10-04 DIAGNOSIS — N1831 Chronic kidney disease, stage 3a: Secondary | ICD-10-CM | POA: Diagnosis not present

## 2020-10-04 DIAGNOSIS — I959 Hypotension, unspecified: Secondary | ICD-10-CM | POA: Diagnosis not present

## 2020-10-04 DIAGNOSIS — I130 Hypertensive heart and chronic kidney disease with heart failure and stage 1 through stage 4 chronic kidney disease, or unspecified chronic kidney disease: Secondary | ICD-10-CM | POA: Diagnosis not present

## 2020-10-04 DIAGNOSIS — J441 Chronic obstructive pulmonary disease with (acute) exacerbation: Secondary | ICD-10-CM | POA: Diagnosis not present

## 2020-10-04 DIAGNOSIS — J9621 Acute and chronic respiratory failure with hypoxia: Secondary | ICD-10-CM | POA: Diagnosis not present

## 2020-10-04 DIAGNOSIS — I872 Venous insufficiency (chronic) (peripheral): Secondary | ICD-10-CM | POA: Diagnosis not present

## 2020-10-04 DIAGNOSIS — I890 Lymphedema, not elsewhere classified: Secondary | ICD-10-CM | POA: Diagnosis not present

## 2020-10-04 DIAGNOSIS — I5033 Acute on chronic diastolic (congestive) heart failure: Secondary | ICD-10-CM | POA: Diagnosis not present

## 2020-10-04 DIAGNOSIS — E1122 Type 2 diabetes mellitus with diabetic chronic kidney disease: Secondary | ICD-10-CM | POA: Diagnosis not present

## 2020-10-04 DIAGNOSIS — E1151 Type 2 diabetes mellitus with diabetic peripheral angiopathy without gangrene: Secondary | ICD-10-CM | POA: Diagnosis not present

## 2020-10-04 DIAGNOSIS — I2510 Atherosclerotic heart disease of native coronary artery without angina pectoris: Secondary | ICD-10-CM | POA: Diagnosis not present

## 2020-10-04 DIAGNOSIS — I82431 Acute embolism and thrombosis of right popliteal vein: Secondary | ICD-10-CM | POA: Diagnosis not present

## 2020-10-04 DIAGNOSIS — I2699 Other pulmonary embolism without acute cor pulmonale: Secondary | ICD-10-CM | POA: Diagnosis not present

## 2020-10-05 ENCOUNTER — Other Ambulatory Visit: Payer: Self-pay | Admitting: *Deleted

## 2020-10-05 ENCOUNTER — Encounter: Payer: Self-pay | Admitting: *Deleted

## 2020-10-05 NOTE — Patient Outreach (Signed)
[facility]) Care Management [facility] CM Telephone Outreach Post-hospital discharge day # 59 without unplanned hospital re-admission  10/05/2020  Thomas Tanner 04-07-1951 [id number]  Successful telephone outreach to Thomas Tanner, 69 y/o male referred to [facility] by [facility] RN CM Hospital Liaison8/10/21for multiple recent hospital and ED visits.  Patient was recently hospitalized August 2-9, 2021 for acute respiratory failure with hypoxia/ CHF exacerbation with shortness of breath and significant peripheral edema. He was discharged home to self-care with home health services in place for PT/ OT. Patient has history including, but not limited to, dCHF; COPD; CKD-III; CAD; HTN/ HLD; DM' chronic pain; morbid obesity, pressure ulcer, and COVID-19 infection.  Patient re-presented to ED 07/20/20 for hypotension and Thomas Tanner was re-admitted to hospital August 11-13, 2079fr hypotension and AKI; he was again discharged home to self-care with established home health services in place.  Unfortunately, patient experienced third hospitalization August 24-29, 2021 for ongoing progressive shortness of breath; it was determined that he hada large saddle pulmonary embolism w/ large clot burden. Pt had a thrombectomy by vascular surgery on 08/03/20.  HIPAA/ identity verified;patient reports, "things are in steady state," and states he has no current concerns around his overall health status; reports continues feeling better and feels that his clinical condition is "stable."  Reports continues working with home health PT, tolerating well; denies new/ recent falls and reports ongoing independence in ADL/ iADL's.  Has attended all recent provider appointments and has plans to receive corona virus vaccine next week.  Continues using home O2 at 2-2.5 L/min HS and intermittently throughout daytime hours.  Continues smoking 3-5 cigarettes a day, safe use of home O2 reiterated with  patient and he was again counseled around smoking cessation and not ever smoking inside where home O2 is; verbalizes understanding.  Positive reinforcement provided for initiating daily weights at home now that he has a usable and accurate scale.  Self-health management of chronic disease state of CHF along with action plan for signs/ symptoms exacerbation was reviewed/ reiterated.    Patient sounds to be in no distress throughout phone call today, and he denies further issues, concerns, or problems today. Iconfirmed that patient hasmy direct phone number, the main [facility] CM office phone number, and the [facility] 24-hour nurse advice phone number should issues arise prior to next scheduled [facility] CM outreach next month.Encouraged patient to contact me directly if needs, questions, issues, or concerns arise prior to next scheduled outreach; patient agreed to do so.  Plan:  Patient will take medications as prescribed and will attend all scheduled provider appointments  Patient will promptly notify care providers for any new concerns/ issues/ problems that arise  Patient will actively participate in home health services as ordered post-hospital discharge  Patient will continue efforts to quit smoking  [facility] CM outreach to continue with scheduled phone callnext month, unless indicated sooner  Goals Addressed              This Visit's Progress   .  COMPLETED: [facility] CM: "I want to have the things I need at home to take care of myself and stay out of the hospital" (pt-stated)        [facility](see longitudinal plan of care for additional care plan information)  Current Barriers:  .Marland KitchenKnowledge Deficits related to self-health management of multiple chronic disease states, including COPD, CHF, and obesity . Possible Care Coordination needs related to DME, pharmacy assistance, disease management, in a patient with  COPD/ CHF/ obesity . Chronic Disease Management support and education needs  related to COPD/ CHF, obesity, tobacco cessation  Nurse Case Manager Clinical Goal(s):  Marland Kitchen Over the next 90 days, patient will verbalize understanding of plan for self health management of chronic disease states of CHF and COPD 08/09/20: GOAL MAINTAINED 08/12/20:  on track; goal maintained; 08/30/20: on track; goal maintained . Over the next 60 days, patient will work with care providers to address needs related to DME at home 08/09/20: GOAL MET- COMPLETED . Over the next 30 days, patient will not experience hospital admission. Hospital Admissions in last 6 months = 3; last hospital discharge: 07/22/2020 08/09/20: [place] RE-ADMISSION 8/24-29, 2021; GOAL RE-ESTABLISHED TODAY 08/12/20: on track; goal maintained 08/30/20: on track; goal maintained/ extended  Interventions:  . Inter-disciplinary care team collaboration (see longitudinal plan of care) . Evaluation of current treatment plan related to COPD/ CHF and patient's adherence to plan as established by provider. . Provided printed and verbal education to patient and/or caregiver about advanced directives . Advised patient to talk to PCP this afternoon around possible needs for DME at home . Provided education to patient re: fall prevention/ smoking cessation . Discussed plans with patient for ongoing care management follow up and provided patient with direct contact information for care management team . Provided patient with printed educational materials related to Advanced directives, Fall prevention, smoking cessation . Placed referral to [place] team for assessment of patient eligibility for patient assistance for inhaler . Coached patient around talking points to cover with PCP at today's appointment . Reviewed recent and upcoming provider appointments . Completed SDOH for depression, transportation, food insecurity  08/09/20: -- discussed with patient current clinical condition post- most recent hospitalization;  confirmed no current clinical concerns -- confirmed home health team remains active; encouraged patient's ongoing participation -- confirmed no medication concerns: patient reports has and is taking all as prescribed; agrees to medication review at time of next call Friday 08/12/20 -- reviewed post-hospital discharge instructions from 08/07/20 -- reviewed upcoming scheduled provider appointments; confirmed patient is aware of all and has plans to attend all as scheduled -- confirmed that patient has obtained recently ordered DME for wheelchair/ home O2; BSC -- completed care coordination outreach to CHF clinic team in an effort to facilitate patient obtaining bariatric scales for in home use  08/12/20: -- medication review completed; no concerns/ problems identified -- review of recent CHF clinic visit: confirmed that patient discussed obtaining bariatric scales with provider- in progress -- reviewed use of home O2 with patient -- using teach back method, provided education around CHF yellow zone signs/ symptoms along with corresponding action plan -- provided education around signs/ symptoms MI/ CVA along with corresponding action plan -- discussed plan for care management follow up and confirmed patient understands to call care provider team and has all contact information -- provided education around value of daily weight monitoring/ recording at home; process for daily weights; importance of recording weights on calendar; weight gain guidelines/ action plan in setting of CHF  -- [facility] CM initial assessment sent to patient's PCP  08/30/20: -- discussed current clinical condition with patient and confirmed no current clinical concerns -- discussed use of home O2; confirmed patient using intermittently -- reviewed SaO2 and BP's at home -- reviewed recent and upcoming provider appointments; confirmed no transportation concerns -- reviewed weight gain guidelines in setting of CHF with patient and  provided education around early vs. Late signs/ symptoms CHF  yellow zone: encouraged patient to follow his plan to contact [facility] to follow up on status of bariatric scales they have ordered for him -- confirmed no recent changes to medications and that patient has and is taking all as prescribed -- confirmed home health services remain active; encouraged patient's ongoing active participation -- confirmed that patient has spoken to [place] team regarding patient assistance with inhalers and Eliquis -- discussed with patient his uncertainty around obtaining flu and corona virus vaccines and encouraged him to discuss with care providers promptly and to consider options carefully -- reiterated previously provided education/ support around smoking cessation- patient continues smoking 4-5 cigarettes per day  Patient Self Care Activities:  . Patient verbalizes understanding of overall plan for self-health management at home . Self administers medications as prescribed . Attends all scheduled provider appointments . Calls pharmacy for medication refills . Performs ADL's independently . Calls provider office for new concerns or questions . Unable to perform IADLs independently . Ongoing need for smoking cessation  Initial goal documentation 08/01/20 Update 08/09/20 Update 08/12/20 Update 08/30/20 Update 10/05/20: completed/ resolved due to duplication of goals   I appreciate the opportunity to participate in [name]'s care,  Oneta Rack, RN, BSN, [employer] Coordinator [facility] Care Management  [phone]        .  [facility] CM: Learn More About My Health        Follow Up Date 11/07/20    - tell my story and reason for my visit - make a list of questions - ask questions - repeat what I heard to make sure I understand - bring a list of my medicines to the visit - speak up when I don't understand    Why is this important?   The best way to learn about your  health and care is by talking to the doctor and nurse.  They will answer your questions and give you information in the way that you like best.    Notes:     .  [facility] CM: Maintain My Quality of Life        Follow Up Date 11/07/20   - discuss my treatment options with the doctor or nurse - make shared treatment decisions with doctor    Why is this important?   Having a long-term illness can be scary.  It can also be stressful for you and your caregiver.  These steps may help.    Notes:     .  [facility] CM: Make and Keep All Appointments        Follow Up Date 11/07/20   - ask family or friend for a ride - keep a calendar with appointment dates    Why is this important?   Part of staying healthy is seeing the doctor for follow-up care.  If you forget your appointments, there are some things you can do to stay on track.    Notes:   10/05/20:  -- Discussed with patient to follow up with care providers (CHF clinic/ cardiologist) to make next appointments -- reviewed with patient recent office visits with pulmonology and PCP    .  [facility] CM: Protect My Health        Follow Up Date 11/07/2020    - schedule appointment for flu shot - schedule appointment for vaccines needed due to my age or health    Why is this important?   Screening tests can find diseases early when they are  easier to treat.  Your doctor or nurse will talk with you about which tests are important for you.  Getting shots for common diseases like the flu and shingles will help prevent them.     Notes:   10/05/20: -- discussed with patient plan to obtain flu vaccine and corona virus vaccine    .  [facility] CM: Track and Manage Fluids and Swelling        Follow Up Date 11/07/20    - call office if I gain more than 2 pounds in one day or 5 pounds in one week - do ankle pumps when sitting - keep legs up while sitting - track weight in diary - use salt in moderation - watch for swelling in feet, ankles and legs every  day - weigh myself daily    Why is this important?   It is important to check your weight daily and watch how much salt and liquids you have.  It will help you to manage your heart failure.    Notes:  10/05/20: -- Continue daily weights; confirmed reported baseline weights at home consistently 420 lbs -- discussed signs/ symptoms CHF yellow zone    .  [facility] CM: Track and Manage Symptoms        Follow Up Date 11/07/20:   - develop a rescue plan - follow rescue plan if symptoms flare-up - know when to call the doctor - track symptoms and what helps feel better or worse    Why is this important?   You will be able to handle your symptoms better if you keep track of them.  Making some simple changes to your lifestyle will help.  Eating healthy is one thing you can do to take good care of yourself.    Notes:       Oneta Rack, RN, BSN, [place] Care Management  [phone]

## 2020-10-06 ENCOUNTER — Telehealth: Payer: Self-pay

## 2020-10-06 DIAGNOSIS — I82431 Acute embolism and thrombosis of right popliteal vein: Secondary | ICD-10-CM | POA: Diagnosis not present

## 2020-10-06 DIAGNOSIS — E1122 Type 2 diabetes mellitus with diabetic chronic kidney disease: Secondary | ICD-10-CM | POA: Diagnosis not present

## 2020-10-06 DIAGNOSIS — I2510 Atherosclerotic heart disease of native coronary artery without angina pectoris: Secondary | ICD-10-CM | POA: Diagnosis not present

## 2020-10-06 DIAGNOSIS — I130 Hypertensive heart and chronic kidney disease with heart failure and stage 1 through stage 4 chronic kidney disease, or unspecified chronic kidney disease: Secondary | ICD-10-CM | POA: Diagnosis not present

## 2020-10-06 DIAGNOSIS — N1831 Chronic kidney disease, stage 3a: Secondary | ICD-10-CM | POA: Diagnosis not present

## 2020-10-06 DIAGNOSIS — J441 Chronic obstructive pulmonary disease with (acute) exacerbation: Secondary | ICD-10-CM | POA: Diagnosis not present

## 2020-10-06 DIAGNOSIS — E1151 Type 2 diabetes mellitus with diabetic peripheral angiopathy without gangrene: Secondary | ICD-10-CM | POA: Diagnosis not present

## 2020-10-06 DIAGNOSIS — I872 Venous insufficiency (chronic) (peripheral): Secondary | ICD-10-CM | POA: Diagnosis not present

## 2020-10-06 DIAGNOSIS — J9621 Acute and chronic respiratory failure with hypoxia: Secondary | ICD-10-CM | POA: Diagnosis not present

## 2020-10-06 DIAGNOSIS — I5033 Acute on chronic diastolic (congestive) heart failure: Secondary | ICD-10-CM | POA: Diagnosis not present

## 2020-10-06 DIAGNOSIS — I2699 Other pulmonary embolism without acute cor pulmonale: Secondary | ICD-10-CM | POA: Diagnosis not present

## 2020-10-06 DIAGNOSIS — I959 Hypotension, unspecified: Secondary | ICD-10-CM | POA: Diagnosis not present

## 2020-10-06 DIAGNOSIS — I890 Lymphedema, not elsewhere classified: Secondary | ICD-10-CM | POA: Diagnosis not present

## 2020-10-06 NOTE — Telephone Encounter (Signed)
Pt's son came to office and picked up 2 samples of Voltaren gel.

## 2020-10-11 DIAGNOSIS — I82431 Acute embolism and thrombosis of right popliteal vein: Secondary | ICD-10-CM | POA: Diagnosis not present

## 2020-10-11 DIAGNOSIS — E1151 Type 2 diabetes mellitus with diabetic peripheral angiopathy without gangrene: Secondary | ICD-10-CM | POA: Diagnosis not present

## 2020-10-11 DIAGNOSIS — I872 Venous insufficiency (chronic) (peripheral): Secondary | ICD-10-CM | POA: Diagnosis not present

## 2020-10-11 DIAGNOSIS — J9621 Acute and chronic respiratory failure with hypoxia: Secondary | ICD-10-CM | POA: Diagnosis not present

## 2020-10-11 DIAGNOSIS — J441 Chronic obstructive pulmonary disease with (acute) exacerbation: Secondary | ICD-10-CM | POA: Diagnosis not present

## 2020-10-11 DIAGNOSIS — I959 Hypotension, unspecified: Secondary | ICD-10-CM | POA: Diagnosis not present

## 2020-10-11 DIAGNOSIS — N1831 Chronic kidney disease, stage 3a: Secondary | ICD-10-CM | POA: Diagnosis not present

## 2020-10-11 DIAGNOSIS — I130 Hypertensive heart and chronic kidney disease with heart failure and stage 1 through stage 4 chronic kidney disease, or unspecified chronic kidney disease: Secondary | ICD-10-CM | POA: Diagnosis not present

## 2020-10-11 DIAGNOSIS — I5033 Acute on chronic diastolic (congestive) heart failure: Secondary | ICD-10-CM | POA: Diagnosis not present

## 2020-10-11 DIAGNOSIS — I2699 Other pulmonary embolism without acute cor pulmonale: Secondary | ICD-10-CM | POA: Diagnosis not present

## 2020-10-11 DIAGNOSIS — I2510 Atherosclerotic heart disease of native coronary artery without angina pectoris: Secondary | ICD-10-CM | POA: Diagnosis not present

## 2020-10-11 DIAGNOSIS — E1122 Type 2 diabetes mellitus with diabetic chronic kidney disease: Secondary | ICD-10-CM | POA: Diagnosis not present

## 2020-10-11 DIAGNOSIS — I890 Lymphedema, not elsewhere classified: Secondary | ICD-10-CM | POA: Diagnosis not present

## 2020-10-13 ENCOUNTER — Telehealth: Payer: Self-pay

## 2020-10-13 DIAGNOSIS — N1831 Chronic kidney disease, stage 3a: Secondary | ICD-10-CM | POA: Diagnosis not present

## 2020-10-13 DIAGNOSIS — J9621 Acute and chronic respiratory failure with hypoxia: Secondary | ICD-10-CM | POA: Diagnosis not present

## 2020-10-13 DIAGNOSIS — I2510 Atherosclerotic heart disease of native coronary artery without angina pectoris: Secondary | ICD-10-CM | POA: Diagnosis not present

## 2020-10-13 DIAGNOSIS — I130 Hypertensive heart and chronic kidney disease with heart failure and stage 1 through stage 4 chronic kidney disease, or unspecified chronic kidney disease: Secondary | ICD-10-CM | POA: Diagnosis not present

## 2020-10-13 DIAGNOSIS — I890 Lymphedema, not elsewhere classified: Secondary | ICD-10-CM | POA: Diagnosis not present

## 2020-10-13 DIAGNOSIS — I959 Hypotension, unspecified: Secondary | ICD-10-CM | POA: Diagnosis not present

## 2020-10-13 DIAGNOSIS — I872 Venous insufficiency (chronic) (peripheral): Secondary | ICD-10-CM | POA: Diagnosis not present

## 2020-10-13 DIAGNOSIS — I82431 Acute embolism and thrombosis of right popliteal vein: Secondary | ICD-10-CM | POA: Diagnosis not present

## 2020-10-13 DIAGNOSIS — I2699 Other pulmonary embolism without acute cor pulmonale: Secondary | ICD-10-CM | POA: Diagnosis not present

## 2020-10-13 DIAGNOSIS — I5033 Acute on chronic diastolic (congestive) heart failure: Secondary | ICD-10-CM | POA: Diagnosis not present

## 2020-10-13 DIAGNOSIS — E1151 Type 2 diabetes mellitus with diabetic peripheral angiopathy without gangrene: Secondary | ICD-10-CM | POA: Diagnosis not present

## 2020-10-13 DIAGNOSIS — J441 Chronic obstructive pulmonary disease with (acute) exacerbation: Secondary | ICD-10-CM | POA: Diagnosis not present

## 2020-10-13 DIAGNOSIS — E1122 Type 2 diabetes mellitus with diabetic chronic kidney disease: Secondary | ICD-10-CM | POA: Diagnosis not present

## 2020-10-13 NOTE — Telephone Encounter (Signed)
Plan of care signed and placed in [facility] folder ready for pickup. °

## 2020-10-14 ENCOUNTER — Other Ambulatory Visit: Payer: Self-pay

## 2020-10-14 DIAGNOSIS — I10 Essential (primary) hypertension: Secondary | ICD-10-CM

## 2020-10-14 MED ORDER — FUROSEMIDE 20 MG PO TABS
20.0000 mg | ORAL_TABLET | Freq: Every day | ORAL | 0 refills | Status: DC
Start: 2020-10-14 — End: 2020-11-16

## 2020-10-14 MED ORDER — METOPROLOL SUCCINATE ER 50 MG PO TB24: 50.0000 mg | ORAL_TABLET | Freq: Every day | ORAL | 1 refills | Status: DC

## 2020-10-18 DIAGNOSIS — N1831 Chronic kidney disease, stage 3a: Secondary | ICD-10-CM | POA: Diagnosis not present

## 2020-10-18 DIAGNOSIS — I82431 Acute embolism and thrombosis of right popliteal vein: Secondary | ICD-10-CM | POA: Diagnosis not present

## 2020-10-18 DIAGNOSIS — I959 Hypotension, unspecified: Secondary | ICD-10-CM | POA: Diagnosis not present

## 2020-10-18 DIAGNOSIS — I2699 Other pulmonary embolism without acute cor pulmonale: Secondary | ICD-10-CM | POA: Diagnosis not present

## 2020-10-18 DIAGNOSIS — I2510 Atherosclerotic heart disease of native coronary artery without angina pectoris: Secondary | ICD-10-CM | POA: Diagnosis not present

## 2020-10-18 DIAGNOSIS — J441 Chronic obstructive pulmonary disease with (acute) exacerbation: Secondary | ICD-10-CM | POA: Diagnosis not present

## 2020-10-18 DIAGNOSIS — I872 Venous insufficiency (chronic) (peripheral): Secondary | ICD-10-CM | POA: Diagnosis not present

## 2020-10-18 DIAGNOSIS — E1122 Type 2 diabetes mellitus with diabetic chronic kidney disease: Secondary | ICD-10-CM | POA: Diagnosis not present

## 2020-10-18 DIAGNOSIS — J9621 Acute and chronic respiratory failure with hypoxia: Secondary | ICD-10-CM | POA: Diagnosis not present

## 2020-10-18 DIAGNOSIS — I890 Lymphedema, not elsewhere classified: Secondary | ICD-10-CM | POA: Diagnosis not present

## 2020-10-18 DIAGNOSIS — I5033 Acute on chronic diastolic (congestive) heart failure: Secondary | ICD-10-CM | POA: Diagnosis not present

## 2020-10-18 DIAGNOSIS — E1151 Type 2 diabetes mellitus with diabetic peripheral angiopathy without gangrene: Secondary | ICD-10-CM | POA: Diagnosis not present

## 2020-10-18 DIAGNOSIS — I130 Hypertensive heart and chronic kidney disease with heart failure and stage 1 through stage 4 chronic kidney disease, or unspecified chronic kidney disease: Secondary | ICD-10-CM | POA: Diagnosis not present

## 2020-10-20 DIAGNOSIS — I872 Venous insufficiency (chronic) (peripheral): Secondary | ICD-10-CM | POA: Diagnosis not present

## 2020-10-20 DIAGNOSIS — I2699 Other pulmonary embolism without acute cor pulmonale: Secondary | ICD-10-CM | POA: Diagnosis not present

## 2020-10-20 DIAGNOSIS — J441 Chronic obstructive pulmonary disease with (acute) exacerbation: Secondary | ICD-10-CM | POA: Diagnosis not present

## 2020-10-20 DIAGNOSIS — J9621 Acute and chronic respiratory failure with hypoxia: Secondary | ICD-10-CM | POA: Diagnosis not present

## 2020-10-20 DIAGNOSIS — I5033 Acute on chronic diastolic (congestive) heart failure: Secondary | ICD-10-CM | POA: Diagnosis not present

## 2020-10-20 DIAGNOSIS — E1151 Type 2 diabetes mellitus with diabetic peripheral angiopathy without gangrene: Secondary | ICD-10-CM | POA: Diagnosis not present

## 2020-10-20 DIAGNOSIS — N1831 Chronic kidney disease, stage 3a: Secondary | ICD-10-CM | POA: Diagnosis not present

## 2020-10-20 DIAGNOSIS — I890 Lymphedema, not elsewhere classified: Secondary | ICD-10-CM | POA: Diagnosis not present

## 2020-10-20 DIAGNOSIS — I130 Hypertensive heart and chronic kidney disease with heart failure and stage 1 through stage 4 chronic kidney disease, or unspecified chronic kidney disease: Secondary | ICD-10-CM | POA: Diagnosis not present

## 2020-10-20 DIAGNOSIS — I2510 Atherosclerotic heart disease of native coronary artery without angina pectoris: Secondary | ICD-10-CM | POA: Diagnosis not present

## 2020-10-20 DIAGNOSIS — I82431 Acute embolism and thrombosis of right popliteal vein: Secondary | ICD-10-CM | POA: Diagnosis not present

## 2020-10-20 DIAGNOSIS — I959 Hypotension, unspecified: Secondary | ICD-10-CM | POA: Diagnosis not present

## 2020-10-20 DIAGNOSIS — E1122 Type 2 diabetes mellitus with diabetic chronic kidney disease: Secondary | ICD-10-CM | POA: Diagnosis not present

## 2020-10-25 DIAGNOSIS — E1151 Type 2 diabetes mellitus with diabetic peripheral angiopathy without gangrene: Secondary | ICD-10-CM | POA: Diagnosis not present

## 2020-10-25 DIAGNOSIS — I890 Lymphedema, not elsewhere classified: Secondary | ICD-10-CM | POA: Diagnosis not present

## 2020-10-25 DIAGNOSIS — N1831 Chronic kidney disease, stage 3a: Secondary | ICD-10-CM | POA: Diagnosis not present

## 2020-10-25 DIAGNOSIS — J9621 Acute and chronic respiratory failure with hypoxia: Secondary | ICD-10-CM | POA: Diagnosis not present

## 2020-10-25 DIAGNOSIS — I130 Hypertensive heart and chronic kidney disease with heart failure and stage 1 through stage 4 chronic kidney disease, or unspecified chronic kidney disease: Secondary | ICD-10-CM | POA: Diagnosis not present

## 2020-10-25 DIAGNOSIS — I872 Venous insufficiency (chronic) (peripheral): Secondary | ICD-10-CM | POA: Diagnosis not present

## 2020-10-25 DIAGNOSIS — I82431 Acute embolism and thrombosis of right popliteal vein: Secondary | ICD-10-CM | POA: Diagnosis not present

## 2020-10-25 DIAGNOSIS — I959 Hypotension, unspecified: Secondary | ICD-10-CM | POA: Diagnosis not present

## 2020-10-25 DIAGNOSIS — I2510 Atherosclerotic heart disease of native coronary artery without angina pectoris: Secondary | ICD-10-CM | POA: Diagnosis not present

## 2020-10-25 DIAGNOSIS — I5033 Acute on chronic diastolic (congestive) heart failure: Secondary | ICD-10-CM | POA: Diagnosis not present

## 2020-10-25 DIAGNOSIS — J441 Chronic obstructive pulmonary disease with (acute) exacerbation: Secondary | ICD-10-CM | POA: Diagnosis not present

## 2020-10-25 DIAGNOSIS — E1122 Type 2 diabetes mellitus with diabetic chronic kidney disease: Secondary | ICD-10-CM | POA: Diagnosis not present

## 2020-10-25 DIAGNOSIS — I2699 Other pulmonary embolism without acute cor pulmonale: Secondary | ICD-10-CM | POA: Diagnosis not present

## 2020-10-27 DIAGNOSIS — E1151 Type 2 diabetes mellitus with diabetic peripheral angiopathy without gangrene: Secondary | ICD-10-CM | POA: Diagnosis not present

## 2020-10-27 DIAGNOSIS — N1831 Chronic kidney disease, stage 3a: Secondary | ICD-10-CM | POA: Diagnosis not present

## 2020-10-27 DIAGNOSIS — I2699 Other pulmonary embolism without acute cor pulmonale: Secondary | ICD-10-CM | POA: Diagnosis not present

## 2020-10-27 DIAGNOSIS — I872 Venous insufficiency (chronic) (peripheral): Secondary | ICD-10-CM | POA: Diagnosis not present

## 2020-10-27 DIAGNOSIS — I5033 Acute on chronic diastolic (congestive) heart failure: Secondary | ICD-10-CM | POA: Diagnosis not present

## 2020-10-27 DIAGNOSIS — E1122 Type 2 diabetes mellitus with diabetic chronic kidney disease: Secondary | ICD-10-CM | POA: Diagnosis not present

## 2020-10-27 DIAGNOSIS — J9621 Acute and chronic respiratory failure with hypoxia: Secondary | ICD-10-CM | POA: Diagnosis not present

## 2020-10-27 DIAGNOSIS — I82431 Acute embolism and thrombosis of right popliteal vein: Secondary | ICD-10-CM | POA: Diagnosis not present

## 2020-10-27 DIAGNOSIS — I130 Hypertensive heart and chronic kidney disease with heart failure and stage 1 through stage 4 chronic kidney disease, or unspecified chronic kidney disease: Secondary | ICD-10-CM | POA: Diagnosis not present

## 2020-10-27 DIAGNOSIS — J441 Chronic obstructive pulmonary disease with (acute) exacerbation: Secondary | ICD-10-CM | POA: Diagnosis not present

## 2020-10-27 DIAGNOSIS — I959 Hypotension, unspecified: Secondary | ICD-10-CM | POA: Diagnosis not present

## 2020-10-27 DIAGNOSIS — I2510 Atherosclerotic heart disease of native coronary artery without angina pectoris: Secondary | ICD-10-CM | POA: Diagnosis not present

## 2020-10-27 DIAGNOSIS — I890 Lymphedema, not elsewhere classified: Secondary | ICD-10-CM | POA: Diagnosis not present

## 2020-11-01 DIAGNOSIS — R531 Weakness: Secondary | ICD-10-CM | POA: Diagnosis not present

## 2020-11-01 DIAGNOSIS — U071 COVID-19: Secondary | ICD-10-CM | POA: Diagnosis not present

## 2020-11-02 ENCOUNTER — Telehealth: Payer: Self-pay

## 2020-11-02 DIAGNOSIS — I959 Hypotension, unspecified: Secondary | ICD-10-CM | POA: Diagnosis not present

## 2020-11-02 DIAGNOSIS — N1831 Chronic kidney disease, stage 3a: Secondary | ICD-10-CM | POA: Diagnosis not present

## 2020-11-02 DIAGNOSIS — I2510 Atherosclerotic heart disease of native coronary artery without angina pectoris: Secondary | ICD-10-CM | POA: Diagnosis not present

## 2020-11-02 DIAGNOSIS — I82431 Acute embolism and thrombosis of right popliteal vein: Secondary | ICD-10-CM | POA: Diagnosis not present

## 2020-11-02 DIAGNOSIS — I890 Lymphedema, not elsewhere classified: Secondary | ICD-10-CM | POA: Diagnosis not present

## 2020-11-02 DIAGNOSIS — I2699 Other pulmonary embolism without acute cor pulmonale: Secondary | ICD-10-CM | POA: Diagnosis not present

## 2020-11-02 DIAGNOSIS — J441 Chronic obstructive pulmonary disease with (acute) exacerbation: Secondary | ICD-10-CM | POA: Diagnosis not present

## 2020-11-02 DIAGNOSIS — I130 Hypertensive heart and chronic kidney disease with heart failure and stage 1 through stage 4 chronic kidney disease, or unspecified chronic kidney disease: Secondary | ICD-10-CM | POA: Diagnosis not present

## 2020-11-02 DIAGNOSIS — E1151 Type 2 diabetes mellitus with diabetic peripheral angiopathy without gangrene: Secondary | ICD-10-CM | POA: Diagnosis not present

## 2020-11-02 DIAGNOSIS — I872 Venous insufficiency (chronic) (peripheral): Secondary | ICD-10-CM | POA: Diagnosis not present

## 2020-11-02 DIAGNOSIS — J9621 Acute and chronic respiratory failure with hypoxia: Secondary | ICD-10-CM | POA: Diagnosis not present

## 2020-11-02 DIAGNOSIS — I5033 Acute on chronic diastolic (congestive) heart failure: Secondary | ICD-10-CM | POA: Diagnosis not present

## 2020-11-02 DIAGNOSIS — E1122 Type 2 diabetes mellitus with diabetic chronic kidney disease: Secondary | ICD-10-CM | POA: Diagnosis not present

## 2020-11-02 NOTE — Telephone Encounter (Signed)
Gave verbal order [facility] [phone] for Physical therapy verbal order due to pt missed last visit

## 2020-11-07 ENCOUNTER — Telehealth: Payer: Self-pay

## 2020-11-07 ENCOUNTER — Other Ambulatory Visit: Payer: Self-pay | Admitting: *Deleted

## 2020-11-07 ENCOUNTER — Encounter: Payer: Self-pay | Admitting: *Deleted

## 2020-11-07 NOTE — Telephone Encounter (Signed)
Home health plan of care signed and placed in [facility] folder.

## 2020-11-07 NOTE — Patient Instructions (Signed)
Goals Addressed            This Visit's Progress    COMPLETED: [facility] CM: Learn More About My Health   On track    Follow Up Date 11/07/20    - tell my story and reason for my visit - make a list of questions - ask questions - repeat what I heard to make sure I understand - bring a list of my medicines to the visit - speak up when I don't understand    Why is this important?   The best way to learn about your health and care is by talking to the doctor and nurse.  They will answer your questions and give you information in the way that you like best.    Notes:      COMPLETED: [facility] CM: Maintain My Quality of Life   On track    Follow Up Date 11/07/20   - discuss my treatment options with the doctor or nurse - make shared treatment decisions with doctor    Why is this important?   Having a long-term illness can be scary.  It can also be stressful for you and your caregiver.  These steps may help.    Notes:      [facility] CM: Make and Keep All Appointments   On track    Follow Up Date 12/06/20   - ask family or friend for a ride - keep a calendar with appointment dates    Why is this important?   Part of staying healthy is seeing the doctor for follow-up care.  If you forget your appointments, there are some things you can do to stay on track.    Notes:   10/05/20:  -- Discussed with patient to follow up with care providers (CHF clinic/ cardiologist) to make next appointments -- reviewed with patient recent office visits with pulmonology and PCP  11/07/20: -- encouraged patient to make prompt appointment with CHF clinic to obtain bariatric scales, as discussed during telephone call 10/05/20: confirmed patient declines need for care coordination in scheduling appointment -- confirmed ongoing reliable transportation through family members -- reviewed upcoming scheduled provider appointments      [facility] CM: Protect My Health   On track    Follow Up Date 12/06/2020    -  schedule appointment for flu shot - schedule appointment for vaccines needed due to my age or health    Why is this important?   Screening tests can find diseases early when they are easier to treat.  Your doctor or nurse will talk with you about which tests are important for you.  Getting shots for common diseases like the flu and shingles will help prevent them.     Notes:   10/05/20: -- discussed with patient plan to obtain flu vaccine and corona virus vaccine  11/07/20: -- reiterated need to obtain flu and corona virus vaccines: confirmed patient verbalizes plans to obtain this week and declines care coordination needs for same -- confirmed ongoing use of home O2 at 2- 2.5 L/min via [place]:  -- confirmed patient does not smoke inside home: previously provided education around smoking cessation strategies discussed with patient/ safe use of home O2 discussed with patient -- confirmed no recent or new falls: continues using walker for ambulation, working with home health PT     [facility] CM: Track and Manage Fluids and Swelling   On track    Follow Up Date 12/06/20    - call  office if I gain more than 2 pounds in one day or 5 pounds in one week - do ankle pumps when sitting - keep legs up while sitting - track weight in diary - use salt in moderation - watch for swelling in feet, ankles and legs every day - weigh myself daily    Why is this important?   It is important to check your weight daily and watch how much salt and liquids you have.  It will help you to manage your heart failure.    Notes:  10/05/20: -- Continue daily weights; confirmed reported baseline weights at home consistently 420 lbs -- discussed signs/ symptoms CHF yellow zone  11/07/20: -- reviewed previously provided education for signs/ symptoms CHF yellow zone/ weight gain guidelines along with corresponding action plan- provided printed education in AVS -- encouraged patient to promptly obtain bariatric scales  from CHF clinic- as previously coordinated: confirmed patient will call to schedule CHF clinic appointment today; confirmed patient will contact me for care coordination if/ as needed     [facility] CM: Track and Manage Symptoms   On track    Follow Up Date 12/06/20:   - develop a rescue plan - follow rescue plan if symptoms flare-up - know when to call the doctor - track symptoms and what helps feel better or worse - dress right for the weather, hot or cold    Why is this important?   You will be able to handle your symptoms better if you keep track of them.  Making some simple changes to your lifestyle will help.  Eating healthy is one thing you can do to take good care of yourself.    Notes:   11/07/20: -- discussed current clinical condition with patient: confirmed no current clinical concerns -- confirmed no recent changes to medications/ no medication concerns: patient reports continues to self-manage       Heart Failure Action Plan A heart failure action plan helps you understand what to do when you have symptoms of heart failure. Follow the plan that was created by you and your health care provider. Review your plan each time you visit your health care provider. Red zone These signs and symptoms mean you should get medical help right away:  You have trouble breathing when resting.  You have a dry cough that is getting worse.  You have swelling or pain in your legs or abdomen that is getting worse.  You suddenly gain more than 2-3 lb (0.9-1.4 kg) in a day, or more than 5 lb (2.3 kg) in one week. This amount may be more or less depending on your condition.  You have trouble staying awake or you feel confused.  You have chest pain.  You do not have an appetite.  You pass out. If you experience any of these symptoms:  Call your local emergency services (911 in the [place] right away or seek help at the emergency department of the nearest hospital. Yellow zone These signs  and symptoms mean your condition may be getting worse and you should make some changes:  You have trouble breathing when you are active or you need to sleep with extra pillows.  You have swelling in your legs or abdomen.  You gain 2-3 lb (0.9-1.4 kg) in one day, or 5 lb (2.3 kg) in one week. This amount may be more or less depending on your condition.  You get tired easily.  You have trouble sleeping.  You have a dry  cough. If you experience any of these symptoms:  Contact your health care provider within the next day.  Your health care provider may adjust your medicines. Green zone These signs mean you are doing well and can continue what you are doing:  You do not have shortness of breath.  You have very little swelling or no new swelling.  Your weight is stable (no gain or loss).  You have a normal activity level.  You do not have chest pain or any other new symptoms. Follow these instructions at home:  Take over-the-counter and prescription medicines only as told by your health care provider.  Weigh yourself daily. Your target weight is __________ lb (__________ kg). ? Call your health care provider if you gain more than __________ lb (__________ kg) in a day, or more than __________ lb (__________ kg) in one week.  Eat a heart-healthy diet. Work with a diet and nutrition specialist (dietitian) to create an eating plan that is best for you.  Keep all follow-up visits as told by your health care provider. This is important. Where to find more information  American Heart Association: www.heart.org Summary  Follow the action plan that was created by you and your health care provider.  Get help right away if you have any symptoms in the Red zone. This information is not intended to replace advice given to you by your health care provider. Make sure you discuss any questions you have with your health care provider. Document Revised: 11/08/2017 Document Reviewed:  01/05/2017 Elsevier Patient Education  2020 [employer].   Steps to Quit Smoking Smoking tobacco is the leading cause of preventable death. It can affect almost every organ in the body. Smoking puts you and people around you at risk for many serious, long-lasting (chronic) diseases. Quitting smoking can be hard, but it is one of the best things that you can do for your health. It is never too late to quit. How do I get ready to quit? When you decide to quit smoking, make a plan to help you succeed. Before you quit:  Pick a date to quit. Set a date within the next 2 weeks to give you time to prepare.  Write down the reasons why you are quitting. Keep this list in places where you will see it often.  Tell your family, friends, and co-workers that you are quitting. Their support is important.  Talk with your doctor about the choices that may help you quit.  Find out if your health insurance will pay for these treatments.  Know the people, places, things, and activities that make you want to smoke (triggers). Avoid them. What first steps can I take to quit smoking?  Throw away all cigarettes at home, at work, and in your car.  Throw away the things that you use when you smoke, such as ashtrays and lighters.  Clean your car. Make sure to empty the ashtray.  Clean your home, including curtains and carpets. What can I do to help me quit smoking? Talk with your doctor about taking medicines and seeing a counselor at the same time. You are more likely to succeed when you do both.  If you are pregnant or breastfeeding, talk with your doctor about counseling or other ways to quit smoking. Do not take medicine to help you quit smoking unless your doctor tells you to do so. To quit smoking: Quit right away  Quit smoking totally, instead of slowly cutting back on how much you  smoke over a period of time.  Go to counseling. You are more likely to quit if you go to counseling sessions  regularly. Take medicine You may take medicines to help you quit. Some medicines need a prescription, and some you can buy over-the-counter. Some medicines may contain a drug called nicotine to replace the nicotine in cigarettes. Medicines may:  Help you to stop having the desire to smoke (cravings).  Help to stop the problems that come when you stop smoking (withdrawal symptoms). Your doctor may ask you to use:  Nicotine patches, gum, or lozenges.  Nicotine inhalers or sprays.  Non-nicotine medicine that is taken by mouth. Find resources Find resources and other ways to help you quit smoking and remain smoke-free after you quit. These resources are most helpful when you use them often. They include:  Online chats with a Veterinary surgeon.  Phone quitlines.  Printed Materials engineer.  Support groups or group counseling.  Text messaging programs.  Mobile phone apps. Use apps on your mobile phone or tablet that can help you stick to your quit plan. There are many free apps for mobile phones and tablets as well as websites. Examples include Quit Guide from the [employer] and smokefree.gov  What things can I do to make it easier to quit?   Talk to your family and friends. Ask them to support and encourage you.  Call a phone quitline (1-800-QUIT-NOW), reach out to support groups, or work with a Veterinary surgeon.  Ask people who smoke to not smoke around you.  Avoid places that make you want to smoke, such as: ? Bars. ? Parties. ? Smoke-break areas at work.  Spend time with people who do not smoke.  Lower the stress in your life. Stress can make you want to smoke. Try these things to help your stress: ? Getting regular exercise. ? Doing deep-breathing exercises. ? Doing yoga. ? Meditating. ? Doing a body scan. To do this, close your eyes, focus on one area of your body at a time from head to toe. Notice which parts of your body are tense. Try to relax the muscles in those areas. How will I  feel when I quit smoking? Day 1 to 3 weeks Within the first 24 hours, you may start to have some problems that come from quitting tobacco. These problems are very bad 2-3 days after you quit, but they do not often last for more than 2-3 weeks. You may get these symptoms:  Mood swings.  Feeling restless, nervous, angry, or annoyed.  Trouble concentrating.  Dizziness.  Strong desire for high-sugar foods and nicotine.  Weight gain.  Trouble pooping (constipation).  Feeling like you may vomit (nausea).  Coughing or a sore throat.  Changes in how the medicines that you take for other issues work in your body.  Depression.  Trouble sleeping (insomnia). Week 3 and afterward After the first 2-3 weeks of quitting, you may start to notice more positive results, such as:  Better sense of smell and taste.  Less coughing and sore throat.  Slower heart rate.  Lower blood pressure.  Clearer skin.  Better breathing.  Fewer sick days. Quitting smoking can be hard. Do not give up if you fail the first time. Some people need to try a few times before they succeed. Do your best to stick to your quit plan, and talk with your doctor if you have any questions or concerns. Summary  Smoking tobacco is the leading cause of preventable death. Quitting smoking  can be hard, but it is one of the best things that you can do for your health.  When you decide to quit smoking, make a plan to help you succeed.  Quit smoking right away, not slowly over a period of time.  When you start quitting, seek help from your doctor, family, or friends. This information is not intended to replace advice given to you by your health care provider. Make sure you discuss any questions you have with your health care provider. Document Revised: 08/21/2019 Document Reviewed: 02/14/2019 Elsevier Patient Education  2020 Elsevier Inc.   Preventing Influenza, Adult Influenza, more commonly known as the flu, is a  viral infection that mainly affects the respiratory tract. The respiratory tract includes structures that help you breathe, such as the lungs, nose, and throat. The flu causes many common cold symptoms, as well as a high fever and body aches. The flu spreads easily from person to person (is contagious). The flu is most common from December through March. This is called flu season.You can catch the flu virus by:  Breathing in droplets from an infected person's cough or sneeze.  Touching something that was recently contaminated with the virus and then touching your mouth, nose, or eyes. What can I do to lower my risk?        You can decrease your risk of getting the flu by:  Getting a flu shot (influenza vaccination) every year. This is the best way to prevent the flu. A flu shot is recommended for everyone age 80 months and older. ? It is best to get a flu shot in the fall, as soon as it is available. Getting a flu shot during winter or spring instead is still a good idea. Flu season can last into early spring. ? Preventing the flu through vaccination requires getting a new flu shot every year. This is because the flu virus changes slightly (mutates) from one year to the next. Even if a flu shot does not completely protect you from all flu virus mutations, it can reduce the severity of your illness and prevent dangerous complications of the flu. ? If you are pregnant, you can and should get a flu shot. ? If you have had a reaction to the shot in the past or if you are allergic to eggs, check with your health care provider before getting a flu shot. ? Sometimes the vaccine is available as a nasal spray. In some years, the nasal spray has not been as effective against the flu virus. Check with your health care provider if you have questions about this.  Practicing good health habits. This is especially important during flu season. ? Avoid contact with people who are sick with flu or cold  symptoms. ? Wash your hands with soap and water often. If soap and water are not available, use alcohol-based hand sanitizer. ? Avoid touching your hands to your face, especially when you have not washed your hands recently. ? Use a disinfectant to clean surfaces at home and at work that may be contaminated with the flu virus. ? Keep your bodys disease-fighting system (immune system) in good shape by eating a healthy diet, drinking plenty of fluids, getting enough sleep, and exercising regularly. If you do get the flu, avoid spreading it to others by:  Staying home until your symptoms have been gone for at least one day.  Covering your mouth and nose when you cough or sneeze.  Avoiding close contact with others, especially  babies and elderly people. Why are these changes important? Getting a flu shot and practicing good health habits protects you as well as other people. If you get the flu, your friends, family, and co-workers are also at risk of getting it, because it spreads so easily to others. Each year, about 2 out of every 10 people get the flu. Having the flu can lead to complications, such as pneumonia, ear infection, and sinus infection. The flu also can be deadly, especially for babies, people older than age 47, and people who have serious long-term diseases. How is this treated? Most people recover from the flu by resting at home and drinking plenty of fluids. However, a prescription antiviral medicine may reduce your flu symptoms and may make your flu go away sooner. This medicine must be started within a few days of getting flu symptoms. You can talk with your health care provider about whether you need an antiviral medicine. Antiviral medicine may be prescribed for people who are at risk for more serious flu symptoms. This includes people who:  Are older than age 90.  Are pregnant.  Have a condition that makes the flu worse or more dangerous. Where to find more  information  Centers for Disease Control and Prevention: tsavxtf.com  ItsBlog.fr: InternetEnthusiasts.hu  American Academy of Family Physicians: familydoctor.org/familydoctor/en/kids/vaccines/preventing-the-flu.html Contact a health care provider if:  You have influenza and you develop new symptoms.  You have: ? Chest pain. ? Diarrhea. ? A fever.  Your cough gets worse, or you produce more mucus. Summary  The best way to prevent the flu is to get a flu shot every year in the fall.  Even if you get the flu after you have received the yearly vaccine, your flu may be milder and go away sooner because of your flu shot.  If you get the flu, antiviral medicines that are started with a few days of symptoms may reduce your flu symptoms and may make your flu go away sooner.  You can also help prevent the flu by practicing good health habits. This information is not intended to replace advice given to you by your health care provider. Make sure you discuss any questions you have with your health care provider. Document Revised: 11/08/2017 Document Reviewed: 08/04/2016 Elsevier Patient Education  2020 Elsevier Inc.    Influenza (Flu) Vaccine (Inactivated or Recombinant): What You Need to Know 1. Why get vaccinated? Influenza vaccine can prevent influenza (flu). Flu is a contagious disease that spreads around the [place] every year, usually between October and May. Anyone can get the flu, but it is more dangerous for some people. Infants and young children, people 60 years of age and older, pregnant women, and people with certain health conditions or a weakened immune system are at greatest risk of flu complications. Pneumonia, bronchitis, sinus infections and ear infections are examples of flu-related complications. If you have a medical condition, such as heart disease, cancer or diabetes, flu can make it worse. Flu can cause fever and chills, sore throat,  muscle aches, fatigue, cough, headache, and runny or stuffy nose. Some people may have vomiting and diarrhea, though this is more common in children than adults. Each year thousands of people in the [place] die from flu, and many more are hospitalized. Flu vaccine prevents millions of illnesses and flu-related visits to the doctor each year. 2. Influenza vaccine CDC recommends everyone 83 months of age and older get vaccinated every flu season. Children 6 months through 8  years of age may need 2 doses during a single flu season. Everyone else needs only 1 dose each flu season. It takes about 2 weeks for protection to develop after vaccination. There are many flu viruses, and they are always changing. Each year a new flu vaccine is made to protect against three or four viruses that are likely to cause disease in the upcoming flu season. Even when the vaccine doesn't exactly match these viruses, it may still provide some protection. Influenza vaccine does not cause flu. Influenza vaccine may be given at the same time as other vaccines. 3. Talk with your health care provider Tell your vaccine provider if the person getting the vaccine:  Has had an allergic reaction after a previous dose of influenza vaccine, or has any severe, life-threatening allergies.  Has ever had Guillain-Barr Syndrome (also called GBS). In some cases, your health care provider may decide to postpone influenza vaccination to a future visit. People with minor illnesses, such as a cold, may be vaccinated. People who are moderately or severely ill should usually wait until they recover before getting influenza vaccine. Your health care provider can give you more information. 4. Risks of a vaccine reaction  Soreness, redness, and swelling where shot is given, fever, muscle aches, and headache can happen after influenza vaccine.  There may be a very small increased risk of Guillain-Barr Syndrome (GBS) after inactivated  influenza vaccine (the flu shot). Young children who get the flu shot along with pneumococcal vaccine (PCV13), and/or DTaP vaccine at the same time might be slightly more likely to have a seizure caused by fever. Tell your health care provider if a child who is getting flu vaccine has ever had a seizure. People sometimes faint after medical procedures, including vaccination. Tell your provider if you feel dizzy or have vision changes or ringing in the ears. As with any medicine, there is a very remote chance of a vaccine causing a severe allergic reaction, other serious injury, or death. 5. What if there is a serious problem? An allergic reaction could occur after the vaccinated person leaves the clinic. If you see signs of a severe allergic reaction (hives, swelling of the face and throat, difficulty breathing, a fast heartbeat, dizziness, or weakness), call 9-1-1 and get the person to the nearest hospital. For other signs that concern you, call your health care provider. Adverse reactions should be reported to the Vaccine Adverse Event Reporting System (VAERS). Your health care provider will usually file this report, or you can do it yourself. Visit the VAERS website at www.vaers.LAgents.nohhs.gov or call 651-310-56071-(707)723-5205.VAERS is only for reporting reactions, and VAERS staff do not give medical advice. 6. The National Vaccine Injury Compensation Program The [employer] Vaccine Injury Compensation Program (VICP) is a federal program that was created to compensate people who may have been injured by certain vaccines. Visit the VICP website at SpiritualWord.atwww.hrsa.gov/vaccinecompensation or call 740 720 89961-610 305 6999 to learn about the program and about filing a claim. There is a time limit to file a claim for compensation. 7. How can I learn more?  Ask your healthcare provider.  Call your local or state health department.  Contact the Centers for Disease Control and Prevention (CDC): ? Call 77509095061-(463)269-0056 (1-800-CDC-INFO)  or ? Visit CDC's BiotechRoom.com.cywww.cdc.gov/flu Vaccine Information Statement (Interim) Inactivated Influenza Vaccine (07/24/2018) This information is not intended to replace advice given to you by your health care provider. Make sure you discuss any questions you have with your health care provider. Document Revised: 03/17/2019 Document Reviewed:  07/28/2018 Elsevier Patient Education  2020 [employer].

## 2020-11-07 NOTE — Patient Outreach (Signed)
[facility]) Care Management [facility] CM Telephone Outreach Post-hospital discharge day # 92 without unplanned hospital re-admission  11/07/2020  Thomas Tanner 04-Jan-1951 [id number]  Successful telephone outreach to Thomas Tanner, 69 y/o male [facility] CM by [facility] RN [facility] Liaison8/10/21for multiple hospital and ED visits.  Patient has history including, but not limited to, dCHF; COPD; CKD-III; CAD; HTN/ HLD; DM; chronic pain; morbid obesity, pressure ulcer, and COVID-19 infection.  August 2-9, 2021 for acute respiratory failure with hypoxia/ CHF exacerbation with shortness of breath and significant peripheral edema.  August 11-13, 2094for hypotension and AKI August 24-29, 2021 for ongoing progressive shortness of breath; it was determined that he hada large saddle pulmonary embolism w/ large clot burden. Pt had a thrombectomy by vascular surgery on 08/03/20.  HIPAA/ identity verified;patient reports, "things are going real good;" he denies clinical or medication concerns, new recent falls today.  Reports continues working with home health PT twice weekly- reports tolerating well "helping;" states that sessions will be decreased to once per week starting next week.  Continues using home O2 at 2-2.5 L/min; continues smoking 3-6 cigarettes per day- safe use of home O2 reiterated with patient who reports today that he "only smokes outside."  Previously provided smoking cessation strategies and education reinforced.  Reports today has yet made appointment with CHF clinic to obtain bariatric scales, as previously coordinated with CHF clinic team- reports he will schedule appointment today.  Encouraged patient to do so; reinforced previously provided education around self-health management CHF.  Encouraged patient to promptly obtain flu and corona virus vaccines as he rpeorts he has still not completed this.  SDOH updated for depression, transportation,  food insecurity- no concerns identified.  Patient sounds to be in no distress throughout phone call today, and he denies further issues, concerns, or problems today. Iconfirmed that patient hasmy direct phone number, the main [facility] CM office phone number, and the [facility] CM 24-hour nurse advice phone number should issues/ care coordination needs arise prior to next scheduled [facility] CM outreachnext month.  Discussed possibility of transfer to [facility] RN health Coach at time of next outreach, patient is agreeable.  Encouraged patient to contact me directly if needs, questions, issues, or concerns arise prior to next scheduled outreach; patient agreed to do so.  Plan:  Patient will take medications as prescribed and will attend all scheduled provider appointments  Patient will contact CHF clinic team to schedule appointment and obtain bariatric scales, as previously coordinated  Patient will promptly notify care providers for any new concerns/ issues/ problems that arise  Patient will actively participate in home health services as ordered post-hospital discharge  Patient will continue efforts to quit smoking  [facility] CM outreach to continue with scheduled phone callnext month, possibly for transfer to [facility] RN Health Coach, unless indicated sooner  Caryl Pina, RN, BSN, [employer] Coordinator [facility] Care Management  [phone]

## 2020-11-08 DIAGNOSIS — I82431 Acute embolism and thrombosis of right popliteal vein: Secondary | ICD-10-CM | POA: Diagnosis not present

## 2020-11-08 DIAGNOSIS — J441 Chronic obstructive pulmonary disease with (acute) exacerbation: Secondary | ICD-10-CM | POA: Diagnosis not present

## 2020-11-08 DIAGNOSIS — E1151 Type 2 diabetes mellitus with diabetic peripheral angiopathy without gangrene: Secondary | ICD-10-CM | POA: Diagnosis not present

## 2020-11-08 DIAGNOSIS — I130 Hypertensive heart and chronic kidney disease with heart failure and stage 1 through stage 4 chronic kidney disease, or unspecified chronic kidney disease: Secondary | ICD-10-CM | POA: Diagnosis not present

## 2020-11-08 DIAGNOSIS — I2699 Other pulmonary embolism without acute cor pulmonale: Secondary | ICD-10-CM | POA: Diagnosis not present

## 2020-11-08 DIAGNOSIS — I5033 Acute on chronic diastolic (congestive) heart failure: Secondary | ICD-10-CM | POA: Diagnosis not present

## 2020-11-08 DIAGNOSIS — I2510 Atherosclerotic heart disease of native coronary artery without angina pectoris: Secondary | ICD-10-CM | POA: Diagnosis not present

## 2020-11-08 DIAGNOSIS — E1122 Type 2 diabetes mellitus with diabetic chronic kidney disease: Secondary | ICD-10-CM | POA: Diagnosis not present

## 2020-11-08 DIAGNOSIS — J9621 Acute and chronic respiratory failure with hypoxia: Secondary | ICD-10-CM | POA: Diagnosis not present

## 2020-11-08 DIAGNOSIS — I890 Lymphedema, not elsewhere classified: Secondary | ICD-10-CM | POA: Diagnosis not present

## 2020-11-08 DIAGNOSIS — I872 Venous insufficiency (chronic) (peripheral): Secondary | ICD-10-CM | POA: Diagnosis not present

## 2020-11-08 DIAGNOSIS — N1831 Chronic kidney disease, stage 3a: Secondary | ICD-10-CM | POA: Diagnosis not present

## 2020-11-08 DIAGNOSIS — I959 Hypotension, unspecified: Secondary | ICD-10-CM | POA: Diagnosis not present

## 2020-11-11 DIAGNOSIS — E1151 Type 2 diabetes mellitus with diabetic peripheral angiopathy without gangrene: Secondary | ICD-10-CM | POA: Diagnosis not present

## 2020-11-11 DIAGNOSIS — I5033 Acute on chronic diastolic (congestive) heart failure: Secondary | ICD-10-CM | POA: Diagnosis not present

## 2020-11-11 DIAGNOSIS — I130 Hypertensive heart and chronic kidney disease with heart failure and stage 1 through stage 4 chronic kidney disease, or unspecified chronic kidney disease: Secondary | ICD-10-CM | POA: Diagnosis not present

## 2020-11-11 DIAGNOSIS — I872 Venous insufficiency (chronic) (peripheral): Secondary | ICD-10-CM | POA: Diagnosis not present

## 2020-11-11 DIAGNOSIS — N1831 Chronic kidney disease, stage 3a: Secondary | ICD-10-CM | POA: Diagnosis not present

## 2020-11-11 DIAGNOSIS — J441 Chronic obstructive pulmonary disease with (acute) exacerbation: Secondary | ICD-10-CM | POA: Diagnosis not present

## 2020-11-11 DIAGNOSIS — I2510 Atherosclerotic heart disease of native coronary artery without angina pectoris: Secondary | ICD-10-CM | POA: Diagnosis not present

## 2020-11-11 DIAGNOSIS — E1122 Type 2 diabetes mellitus with diabetic chronic kidney disease: Secondary | ICD-10-CM | POA: Diagnosis not present

## 2020-11-11 DIAGNOSIS — I959 Hypotension, unspecified: Secondary | ICD-10-CM | POA: Diagnosis not present

## 2020-11-11 DIAGNOSIS — J9621 Acute and chronic respiratory failure with hypoxia: Secondary | ICD-10-CM | POA: Diagnosis not present

## 2020-11-11 DIAGNOSIS — I2699 Other pulmonary embolism without acute cor pulmonale: Secondary | ICD-10-CM | POA: Diagnosis not present

## 2020-11-11 DIAGNOSIS — I890 Lymphedema, not elsewhere classified: Secondary | ICD-10-CM | POA: Diagnosis not present

## 2020-11-11 DIAGNOSIS — I82431 Acute embolism and thrombosis of right popliteal vein: Secondary | ICD-10-CM | POA: Diagnosis not present

## 2020-11-15 DIAGNOSIS — N1831 Chronic kidney disease, stage 3a: Secondary | ICD-10-CM | POA: Diagnosis not present

## 2020-11-15 DIAGNOSIS — I890 Lymphedema, not elsewhere classified: Secondary | ICD-10-CM | POA: Diagnosis not present

## 2020-11-15 DIAGNOSIS — I2510 Atherosclerotic heart disease of native coronary artery without angina pectoris: Secondary | ICD-10-CM | POA: Diagnosis not present

## 2020-11-15 DIAGNOSIS — E1151 Type 2 diabetes mellitus with diabetic peripheral angiopathy without gangrene: Secondary | ICD-10-CM | POA: Diagnosis not present

## 2020-11-15 DIAGNOSIS — J441 Chronic obstructive pulmonary disease with (acute) exacerbation: Secondary | ICD-10-CM | POA: Diagnosis not present

## 2020-11-15 DIAGNOSIS — I82431 Acute embolism and thrombosis of right popliteal vein: Secondary | ICD-10-CM | POA: Diagnosis not present

## 2020-11-15 DIAGNOSIS — I5033 Acute on chronic diastolic (congestive) heart failure: Secondary | ICD-10-CM | POA: Diagnosis not present

## 2020-11-15 DIAGNOSIS — I872 Venous insufficiency (chronic) (peripheral): Secondary | ICD-10-CM | POA: Diagnosis not present

## 2020-11-15 DIAGNOSIS — I130 Hypertensive heart and chronic kidney disease with heart failure and stage 1 through stage 4 chronic kidney disease, or unspecified chronic kidney disease: Secondary | ICD-10-CM | POA: Diagnosis not present

## 2020-11-15 DIAGNOSIS — I2699 Other pulmonary embolism without acute cor pulmonale: Secondary | ICD-10-CM | POA: Diagnosis not present

## 2020-11-15 DIAGNOSIS — J9621 Acute and chronic respiratory failure with hypoxia: Secondary | ICD-10-CM | POA: Diagnosis not present

## 2020-11-15 DIAGNOSIS — I959 Hypotension, unspecified: Secondary | ICD-10-CM | POA: Diagnosis not present

## 2020-11-15 DIAGNOSIS — E1122 Type 2 diabetes mellitus with diabetic chronic kidney disease: Secondary | ICD-10-CM | POA: Diagnosis not present

## 2020-11-16 ENCOUNTER — Other Ambulatory Visit: Payer: Self-pay | Admitting: Hospice and Palliative Medicine

## 2020-11-23 ENCOUNTER — Telehealth: Payer: Self-pay

## 2020-11-23 NOTE — Telephone Encounter (Signed)
Physician order signed by provider and placed in [facility] folder.

## 2020-12-01 DIAGNOSIS — R531 Weakness: Secondary | ICD-10-CM | POA: Diagnosis not present

## 2020-12-01 DIAGNOSIS — U071 COVID-19: Secondary | ICD-10-CM | POA: Diagnosis not present

## 2020-12-06 ENCOUNTER — Encounter: Payer: Self-pay | Admitting: *Deleted

## 2020-12-06 ENCOUNTER — Other Ambulatory Visit: Payer: Self-pay | Admitting: *Deleted

## 2020-12-06 NOTE — Patient Outreach (Signed)
[facility]) Care Management [facility] CM Telephone Outreach Post-hospital discharge day # 121 without unplanned hospital re-admission Unsuccessful outreach attempt # 1- previously engaged patient  12/06/2020  Deaaron Fulghum Shabazz 07-16-1951 [id number]  Unsuccessful telephone outreach toRichard"[name]"Raffety, 69 y/o male referred to [facility] CM by [facility] RN [facility] Liaison8/10/21for multiple hospital and ED visits.  Patient has history including, but not limited to, dCHF; COPD; CKD-III; CAD; HTN/ HLD; DM; chronic pain; morbid obesity, pressure ulcer, and COVID-19 infection(May 2021).  August 2-9, 2021 for acute respiratory failure with hypoxia/ CHF exacerbation with shortness of breath and significant peripheral edema.  August 11-13, 2041for hypotension and AKI August 24-29, 2021 for ongoing progressive shortness of breath; it was determined that he hada large saddle pulmonary embolism w/ large clot burden. Pt had a thrombectomy by vascular surgery on 08/03/20.  HIPAA compliant voice mail message left for patient, requesting return call back.  Plan:  Will re-attempt [facility] CM telephone outreach within 4 business days if I do not hear back from patient first  Caryl Pina, RN, BSN, [employer] Coordinator [facility] Care Management  [phone]

## 2020-12-12 ENCOUNTER — Other Ambulatory Visit: Payer: Self-pay | Admitting: *Deleted

## 2020-12-12 ENCOUNTER — Encounter: Payer: Self-pay | Admitting: *Deleted

## 2020-12-12 NOTE — Patient Outreach (Signed)
[facility]) Care Management [facility] CM Telephone Outreach Post-hospital discharge day # 127 without unplanned hospital re-admission Unsuccessful (consecutive) outreach attempt # 2- previously engaged patient  12/12/2020  Daegen Berrocal Jeffcoat Jan 20, 1951 [id number]  10:10 am: Unsuccessful (consecutive) telephone outreach attempt toRichard"[name]"Moehring, 70 y/o male referred to [facility] CM by [facility] RN [facility] Liaison8/10/21for multiple hospital and ED visits.  Patient has history including, but not limited to, dCHF; COPD; CKD-III; CAD; HTN/ HLD; DM; chronic pain; morbid obesity, pressure ulcer, and COVID-19 infection.  August 2-9, 2021 for acute respiratory failure with hypoxia/ CHF exacerbation with shortness of breath and significant peripheral edema.  August 11-13, 20108for hypotension and AKI August 24-29, 2021 for ongoing progressive shortness of breath; it was determined that he hada large saddle pulmonary embolism w/ large clot burden. Pt had a thrombectomy by vascular surgery on 08/03/20.  HIPAA compliant voice mail message left for patient, requesting return call back.  Plan:  Will place [facility] CM unsuccessful patient outreach letter in mail requesting call back in writing, as this is the second consecutive outreach attempt in previously engaged patient  Will re-attempt [facility] CM telephone outreach within 4 business days if I do not hear back from patient first  Caryl Pina, RN, BSN, [employer] Coordinator [facility] Care Management  [phone]

## 2020-12-16 ENCOUNTER — Other Ambulatory Visit: Payer: Self-pay | Admitting: *Deleted

## 2020-12-16 ENCOUNTER — Encounter: Payer: Self-pay | Admitting: *Deleted

## 2020-12-16 NOTE — Patient Outreach (Signed)
[facility]) Care Management [facility] CM Telephone Outreach Post-hospital discharge day # 130 Unsuccessful (consecutive) outreach attempt # 3- previously engaged patient  12/16/2020  Thomas Tanner [birth year] [id number]  10:40 am: Unsuccessful (consecutive) third telephone outreach attempt toRichard"[name]"Hinesley, 70 y/o male referred to [facility] CM by [facility] RN [facility] Liaison8/10/21for multiple hospital and ED visits.  Patient has history including, but not limited to, dCHF; COPD; CKD-III; CAD; HTN/ HLD; DM; chronic pain; morbid obesity, pressure ulcer, and COVID-19 infection.  August 2-9, 2021 for acute respiratory failure with hypoxia/ CHF exacerbation with shortness of breath and significant peripheral edema.  August 11-13, 2076for hypotension and AKI August 24-29, 2021 for ongoing progressive shortness of breath; it was determined that he hada large saddle pulmonary embolism w/ large clot burden. Pt had a thrombectomy by vascular surgery on 08/03/20.  HIPAA compliant voice mail message left for patient, requesting return call back.  Plan:  Verified [facility] CM unsuccessful patient outreach letter placed in mail 12/12/20 requesting call back in writing  Will ensure [facility] CM telephone outreach from [facility] RN CM for final attempt within 4 weeks if I do not hear back from patient first  Caryl Pina, RN, BSN, [employer] Coordinator [facility] Care Management  [phone]

## 2020-12-29 ENCOUNTER — Other Ambulatory Visit: Payer: Self-pay | Admitting: Internal Medicine

## 2021-01-01 DIAGNOSIS — J449 Chronic obstructive pulmonary disease, unspecified: Secondary | ICD-10-CM | POA: Diagnosis not present

## 2021-01-01 DIAGNOSIS — R531 Weakness: Secondary | ICD-10-CM | POA: Diagnosis not present

## 2021-01-10 ENCOUNTER — Other Ambulatory Visit: Payer: Self-pay | Admitting: *Deleted

## 2021-01-10 NOTE — Patient Outreach (Signed)
[facility]) Care Management  01/10/2021  Yandel Zeiner Mira [birth year] [id number]   Telephone Assessment- Unsuccessful  RN attempted outreach however unsuccessful. RN able to  Leave a HIPAA approved voice message requesting a call back.  Will send outreach letter and attempted another outreach over the next week.  Elliot Cousin, RN Care Management Coordinator [facility] Main Office [phone]

## 2021-01-16 ENCOUNTER — Other Ambulatory Visit: Payer: Self-pay | Admitting: *Deleted

## 2021-01-16 NOTE — Patient Outreach (Signed)
[facility]) Care Management  01/16/2021  Thomas Tanner [birth year] [id number]   Transfer from Ricke Hey, RN  Telephone Assessment-Unsuccessful outreach #2 with this RN case management  RN attempted outreach call however remains unsuccessful. Able to leave a HIPAA approved voice message requesting a call back. Will further engage at this time.  Will attempted another outreach call over the next week for ongoing [facility] services.  Elliot Cousin, RN Care Management Coordinator [facility] Main Office [phone]

## 2021-01-19 ENCOUNTER — Ambulatory Visit: Payer: Medicare Other | Admitting: Hospice and Palliative Medicine

## 2021-01-20 ENCOUNTER — Other Ambulatory Visit: Payer: Self-pay | Admitting: *Deleted

## 2021-01-20 NOTE — Patient Outreach (Signed)
[facility]) Care Management  01/20/2021  Hillery Bhalla Fedor 1951-02-11 [id number]  Transition from Orest Dikes Telephone Assessment-Unsuccessful-HF  Outreach #3 RN attempted outreach call however unsuccessful. RN able to leave a HIPAA approved voice message requesting a call back.   Will rescheduled another outreach in a few weeks.  Elliot Cousin, RN Care Management Coordinator [facility] Main Office [phone]

## 2021-01-24 ENCOUNTER — Other Ambulatory Visit: Payer: Self-pay | Admitting: Internal Medicine

## 2021-01-24 ENCOUNTER — Other Ambulatory Visit: Payer: Self-pay | Admitting: Hospice and Palliative Medicine

## 2021-02-01 ENCOUNTER — Encounter: Payer: Self-pay | Admitting: *Deleted

## 2021-02-01 DIAGNOSIS — J449 Chronic obstructive pulmonary disease, unspecified: Secondary | ICD-10-CM | POA: Diagnosis not present

## 2021-02-16 ENCOUNTER — Other Ambulatory Visit: Payer: Self-pay | Admitting: *Deleted

## 2021-02-16 NOTE — Patient Outreach (Signed)
[facility]) Care Management  02/16/2021  Ryder Man Espericueta September 08, 1951 [id number]   Case Closure-unsuccessful contacts  RN attempted several times to reach this pt however remains unsuccessful with no response from the outreach letter.   Based upon the unsuccessful contacts will close this case and notify the provider of pt's disposition with [facility] services.  Elliot Cousin, RN Care Management Coordinator [facility] Main Office [phone]

## 2021-02-20 ENCOUNTER — Ambulatory Visit: Payer: Medicare Other | Admitting: Hospice and Palliative Medicine

## 2021-02-23 ENCOUNTER — Other Ambulatory Visit: Payer: Self-pay | Admitting: Internal Medicine

## 2021-02-24 ENCOUNTER — Telehealth: Payer: Self-pay

## 2021-02-24 NOTE — Telephone Encounter (Signed)
Oxygen therapy SWO signed by provider and placed in [facility] patient folder. Toni Amend

## 2021-03-01 DIAGNOSIS — J449 Chronic obstructive pulmonary disease, unspecified: Secondary | ICD-10-CM | POA: Diagnosis not present

## 2021-03-10 ENCOUNTER — Other Ambulatory Visit: Payer: Self-pay | Admitting: Internal Medicine

## 2021-03-10 NOTE — Telephone Encounter (Signed)
Pt needs follow up, check on AWV

## 2021-03-14 ENCOUNTER — Other Ambulatory Visit: Payer: Self-pay

## 2021-03-14 MED ORDER — FUROSEMIDE 20 MG PO TABS
20.0000 mg | ORAL_TABLET | Freq: Every day | ORAL | 0 refills | Status: DC
Start: 2021-03-14 — End: 2021-04-20

## 2021-03-17 ENCOUNTER — Telehealth: Payer: Self-pay

## 2021-03-17 NOTE — Telephone Encounter (Signed)
Confirmation of verbal order signed by provider and placed in [facility] folder. Jnyah Brazee 

## 2021-03-27 ENCOUNTER — Telehealth: Payer: Self-pay

## 2021-03-27 NOTE — Telephone Encounter (Signed)
Confirmation of verbal order signed by provider and placed in [facility] folder. Thomas Tanner 

## 2021-04-01 DIAGNOSIS — J449 Chronic obstructive pulmonary disease, unspecified: Secondary | ICD-10-CM | POA: Diagnosis not present

## 2021-04-10 ENCOUNTER — Telehealth: Payer: Self-pay | Admitting: Hospice and Palliative Medicine

## 2021-04-10 NOTE — Progress Notes (Signed)
  Chronic Care Management   Outreach Note  04/10/2021 Name: Thomas Tanner MRN: [id number] DOB: Nov 06, 1951  Referred by: Theotis Burrow, NP Reason for referral : No chief complaint on file.   An unsuccessful telephone outreach was attempted today. The patient was referred to the pharmacist for assistance with care management and care coordination.   Follow Up Plan:   Carley Perdue [employer] Scheduler

## 2021-04-20 ENCOUNTER — Other Ambulatory Visit: Payer: Self-pay | Admitting: Internal Medicine

## 2021-04-20 ENCOUNTER — Other Ambulatory Visit: Payer: Self-pay | Admitting: Hospice and Palliative Medicine

## 2021-04-20 DIAGNOSIS — I10 Essential (primary) hypertension: Secondary | ICD-10-CM

## 2021-04-26 ENCOUNTER — Ambulatory Visit: Payer: Medicare Other | Admitting: Nurse Practitioner

## 2021-04-26 ENCOUNTER — Telehealth: Payer: Self-pay | Admitting: Hospice and Palliative Medicine

## 2021-04-26 NOTE — Progress Notes (Signed)
  Chronic Care Management   Outreach Note  04/26/2021 Name: Thomas Tanner MRN: [id number] DOB: 13-Aug-1951  Referred by: Theotis Burrow, NP (Inactive) Reason for referral : No chief complaint on file.   A second unsuccessful telephone outreach was attempted today. The patient was referred to pharmacist for assistance with care management and care coordination.  Follow Up Plan:   Alvie Heidelberg [employer] Scheduler

## 2021-05-01 DIAGNOSIS — J449 Chronic obstructive pulmonary disease, unspecified: Secondary | ICD-10-CM | POA: Diagnosis not present

## 2021-05-05 ENCOUNTER — Other Ambulatory Visit: Payer: Self-pay

## 2021-05-05 ENCOUNTER — Encounter: Payer: Self-pay | Admitting: Emergency Medicine

## 2021-05-05 ENCOUNTER — Emergency Department: Payer: Medicare Other

## 2021-05-05 ENCOUNTER — Inpatient Hospital Stay
Admission: EM | Admit: 2021-05-05 | Discharge: 2021-05-16 | DRG: 872 | Disposition: A | Discharge status: Skilled Nursing Facility | Payer: Medicare Other | Attending: Internal Medicine | Admitting: Internal Medicine

## 2021-05-05 DIAGNOSIS — A419 Sepsis, unspecified organism: Secondary | ICD-10-CM

## 2021-05-05 DIAGNOSIS — Z7901 Long term (current) use of anticoagulants: Secondary | ICD-10-CM

## 2021-05-05 DIAGNOSIS — B9689 Other specified bacterial agents as the cause of diseases classified elsewhere: Secondary | ICD-10-CM | POA: Diagnosis not present

## 2021-05-05 DIAGNOSIS — I10 Essential (primary) hypertension: Secondary | ICD-10-CM

## 2021-05-05 DIAGNOSIS — I5032 Chronic diastolic (congestive) heart failure: Secondary | ICD-10-CM | POA: Diagnosis not present

## 2021-05-05 DIAGNOSIS — E1122 Type 2 diabetes mellitus with diabetic chronic kidney disease: Secondary | ICD-10-CM | POA: Diagnosis not present

## 2021-05-05 DIAGNOSIS — R7881 Bacteremia: Principal | ICD-10-CM

## 2021-05-05 DIAGNOSIS — J984 Other disorders of lung: Secondary | ICD-10-CM | POA: Diagnosis not present

## 2021-05-05 DIAGNOSIS — J441 Chronic obstructive pulmonary disease with (acute) exacerbation: Secondary | ICD-10-CM | POA: Diagnosis present

## 2021-05-05 DIAGNOSIS — K219 Gastro-esophageal reflux disease without esophagitis: Secondary | ICD-10-CM | POA: Diagnosis not present

## 2021-05-05 DIAGNOSIS — Z951 Presence of aortocoronary bypass graft: Secondary | ICD-10-CM

## 2021-05-05 DIAGNOSIS — I130 Hypertensive heart and chronic kidney disease with heart failure and stage 1 through stage 4 chronic kidney disease, or unspecified chronic kidney disease: Secondary | ICD-10-CM | POA: Diagnosis present

## 2021-05-05 DIAGNOSIS — Z6841 Body Mass Index (BMI) 40.0 and over, adult: Secondary | ICD-10-CM | POA: Diagnosis not present

## 2021-05-05 DIAGNOSIS — F17210 Nicotine dependence, cigarettes, uncomplicated: Secondary | ICD-10-CM | POA: Diagnosis present

## 2021-05-05 DIAGNOSIS — E872 Acidosis, unspecified: Secondary | ICD-10-CM | POA: Diagnosis present

## 2021-05-05 DIAGNOSIS — M1611 Unilateral primary osteoarthritis, right hip: Secondary | ICD-10-CM | POA: Diagnosis not present

## 2021-05-05 DIAGNOSIS — M5441 Lumbago with sciatica, right side: Secondary | ICD-10-CM | POA: Diagnosis present

## 2021-05-05 DIAGNOSIS — R6889 Other general symptoms and signs: Secondary | ICD-10-CM | POA: Diagnosis not present

## 2021-05-05 DIAGNOSIS — Z9981 Dependence on supplemental oxygen: Secondary | ICD-10-CM | POA: Diagnosis not present

## 2021-05-05 DIAGNOSIS — R079 Chest pain, unspecified: Secondary | ICD-10-CM | POA: Diagnosis not present

## 2021-05-05 DIAGNOSIS — R0689 Other abnormalities of breathing: Secondary | ICD-10-CM | POA: Diagnosis not present

## 2021-05-05 DIAGNOSIS — R5381 Other malaise: Secondary | ICD-10-CM | POA: Diagnosis not present

## 2021-05-05 DIAGNOSIS — I700 Atherosclerosis of aorta: Secondary | ICD-10-CM | POA: Diagnosis not present

## 2021-05-05 DIAGNOSIS — I2699 Other pulmonary embolism without acute cor pulmonale: Secondary | ICD-10-CM | POA: Diagnosis not present

## 2021-05-05 DIAGNOSIS — Z820 Family history of epilepsy and other diseases of the nervous system: Secondary | ICD-10-CM

## 2021-05-05 DIAGNOSIS — E785 Hyperlipidemia, unspecified: Secondary | ICD-10-CM | POA: Diagnosis not present

## 2021-05-05 DIAGNOSIS — R000 Tachycardia, unspecified: Secondary | ICD-10-CM | POA: Diagnosis present

## 2021-05-05 DIAGNOSIS — N2889 Other specified disorders of kidney and ureter: Secondary | ICD-10-CM

## 2021-05-05 DIAGNOSIS — Z86711 Personal history of pulmonary embolism: Secondary | ICD-10-CM

## 2021-05-05 DIAGNOSIS — Z8673 Personal history of transient ischemic attack (TIA), and cerebral infarction without residual deficits: Secondary | ICD-10-CM

## 2021-05-05 DIAGNOSIS — K6289 Other specified diseases of anus and rectum: Secondary | ICD-10-CM | POA: Diagnosis present

## 2021-05-05 DIAGNOSIS — N281 Cyst of kidney, acquired: Secondary | ICD-10-CM | POA: Diagnosis not present

## 2021-05-05 DIAGNOSIS — J449 Chronic obstructive pulmonary disease, unspecified: Secondary | ICD-10-CM | POA: Diagnosis present

## 2021-05-05 DIAGNOSIS — K5900 Constipation, unspecified: Secondary | ICD-10-CM

## 2021-05-05 DIAGNOSIS — G8929 Other chronic pain: Secondary | ICD-10-CM | POA: Diagnosis not present

## 2021-05-05 DIAGNOSIS — I499 Cardiac arrhythmia, unspecified: Secondary | ICD-10-CM | POA: Diagnosis not present

## 2021-05-05 DIAGNOSIS — M1990 Unspecified osteoarthritis, unspecified site: Secondary | ICD-10-CM | POA: Diagnosis present

## 2021-05-05 DIAGNOSIS — N5089 Other specified disorders of the male genital organs: Secondary | ICD-10-CM | POA: Diagnosis present

## 2021-05-05 DIAGNOSIS — N5082 Scrotal pain: Secondary | ICD-10-CM | POA: Diagnosis not present

## 2021-05-05 DIAGNOSIS — M5459 Other low back pain: Secondary | ICD-10-CM | POA: Diagnosis not present

## 2021-05-05 DIAGNOSIS — J811 Chronic pulmonary edema: Secondary | ICD-10-CM | POA: Diagnosis not present

## 2021-05-05 DIAGNOSIS — N1831 Chronic kidney disease, stage 3a: Secondary | ICD-10-CM | POA: Diagnosis present

## 2021-05-05 DIAGNOSIS — Z79899 Other long term (current) drug therapy: Secondary | ICD-10-CM

## 2021-05-05 DIAGNOSIS — I517 Cardiomegaly: Secondary | ICD-10-CM | POA: Diagnosis not present

## 2021-05-05 DIAGNOSIS — I2692 Saddle embolus of pulmonary artery without acute cor pulmonale: Secondary | ICD-10-CM | POA: Diagnosis present

## 2021-05-05 DIAGNOSIS — Z8249 Family history of ischemic heart disease and other diseases of the circulatory system: Secondary | ICD-10-CM

## 2021-05-05 DIAGNOSIS — I2510 Atherosclerotic heart disease of native coronary artery without angina pectoris: Secondary | ICD-10-CM | POA: Diagnosis present

## 2021-05-05 DIAGNOSIS — I509 Heart failure, unspecified: Secondary | ICD-10-CM

## 2021-05-05 DIAGNOSIS — Z20822 Contact with and (suspected) exposure to covid-19: Secondary | ICD-10-CM | POA: Diagnosis present

## 2021-05-05 DIAGNOSIS — I252 Old myocardial infarction: Secondary | ICD-10-CM

## 2021-05-05 DIAGNOSIS — Z87442 Personal history of urinary calculi: Secondary | ICD-10-CM

## 2021-05-05 DIAGNOSIS — Z885 Allergy status to narcotic agent status: Secondary | ICD-10-CM

## 2021-05-05 DIAGNOSIS — Z743 Need for continuous supervision: Secondary | ICD-10-CM | POA: Diagnosis not present

## 2021-05-05 DIAGNOSIS — M545 Low back pain, unspecified: Secondary | ICD-10-CM

## 2021-05-05 DIAGNOSIS — M47819 Spondylosis without myelopathy or radiculopathy, site unspecified: Secondary | ICD-10-CM | POA: Diagnosis not present

## 2021-05-05 DIAGNOSIS — R0789 Other chest pain: Secondary | ICD-10-CM | POA: Diagnosis not present

## 2021-05-05 LAB — URINALYSIS, COMPLETE (UACMP) WITH MICROSCOPIC
Bilirubin Urine: NEGATIVE
Glucose, UA: NEGATIVE mg/dL
Ketones, ur: 20 mg/dL — AB
Nitrite: NEGATIVE
Protein, ur: 30 mg/dL — AB
Specific Gravity, Urine: 1.02 (ref 1.005–1.030)
Squamous Epithelial / HPF: NONE SEEN (ref 0–5)
pH: 6 (ref 5.0–8.0)

## 2021-05-05 LAB — BASIC METABOLIC PANEL
Anion gap: 14 (ref 5–15)
BUN: 14 mg/dL (ref 8–23)
CO2: 24 mmol/L (ref 22–32)
Calcium: 8.9 mg/dL (ref 8.9–10.3)
Chloride: 94 mmol/L — ABNORMAL LOW (ref 98–111)
Creatinine, Ser: 1.31 mg/dL — ABNORMAL HIGH (ref 0.61–1.24)
GFR, Estimated: 59 mL/min — ABNORMAL LOW (ref >60)
Glucose, Bld: 158 mg/dL — ABNORMAL HIGH (ref 70–99)
Potassium: 4 mmol/L (ref 3.5–5.1)
Sodium: 132 mmol/L — ABNORMAL LOW (ref 135–145)

## 2021-05-05 LAB — HEPATIC FUNCTION PANEL
ALT: 12 U/L (ref 0–44)
AST: 24 U/L (ref 15–41)
Albumin: 3.6 g/dL (ref 3.5–5.0)
Alkaline Phosphatase: 74 U/L (ref 38–126)
Bilirubin, Direct: 0.6 mg/dL — ABNORMAL HIGH (ref 0.0–0.2)
Indirect Bilirubin: 0.9 mg/dL (ref 0.3–0.9)
Total Bilirubin: 1.5 mg/dL — ABNORMAL HIGH (ref 0.3–1.2)
Total Protein: 7.4 g/dL (ref 6.5–8.1)

## 2021-05-05 LAB — CBC
HCT: 45.7 % (ref 39.0–52.0)
Hemoglobin: 14.9 g/dL (ref 13.0–17.0)
MCH: 29.7 pg (ref 26.0–34.0)
MCHC: 32.6 g/dL (ref 30.0–36.0)
MCV: 91.2 fL (ref 80.0–100.0)
Platelets: 249 10*3/uL (ref 150–400)
RBC: 5.01 MIL/uL (ref 4.22–5.81)
RDW: 14.4 % (ref 11.5–15.5)
WBC: 14.8 10*3/uL — ABNORMAL HIGH (ref 4.0–10.5)
nRBC: 0 % (ref 0.0–0.2)

## 2021-05-05 LAB — BRAIN NATRIURETIC PEPTIDE: B Natriuretic Peptide: 81.8 pg/mL (ref 0.0–100.0)

## 2021-05-05 LAB — TROPONIN I (HIGH SENSITIVITY)
Troponin I (High Sensitivity): 18 ng/L — ABNORMAL HIGH (ref <18)
Troponin I (High Sensitivity): 20 ng/L — ABNORMAL HIGH (ref <18)
Troponin I (High Sensitivity): 25 ng/L — ABNORMAL HIGH (ref <18)
Troponin I (High Sensitivity): 24 ng/L — ABNORMAL HIGH (ref <18)

## 2021-05-05 LAB — PROTIME-INR
INR: 1.5 — ABNORMAL HIGH (ref 0.8–1.2)
Prothrombin Time: 17.9 seconds — ABNORMAL HIGH (ref 11.4–15.2)

## 2021-05-05 LAB — APTT: aPTT: 35 seconds (ref 24–36)

## 2021-05-05 LAB — LACTIC ACID, PLASMA
Lactic Acid, Venous: 5.7 mmol/L (ref 0.5–1.9)
Lactic Acid, Venous: >11 mmol/L (ref 0.5–1.9)
Lactic Acid, Venous: 1.5 mmol/L (ref 0.5–1.9)
Lactic Acid, Venous: 0.9 mmol/L (ref 0.5–1.9)

## 2021-05-05 LAB — RESP PANEL BY RT-PCR (FLU A&B, COVID) ARPGX2
Influenza A by PCR: NEGATIVE
Influenza B by PCR: NEGATIVE
SARS Coronavirus 2 by RT PCR: NEGATIVE

## 2021-05-05 LAB — MAGNESIUM: Magnesium: 1.9 mg/dL (ref 1.7–2.4)

## 2021-05-05 LAB — PROCALCITONIN: Procalcitonin: 1.05 ng/mL

## 2021-05-05 MED ORDER — FAMOTIDINE 20 MG PO TABS
20.0000 mg | ORAL_TABLET | Freq: Two times a day (BID) | ORAL | Status: DC
  Administered 2021-05-05 – 2021-05-16 (×22): 20 mg via ORAL
  Filled 2021-05-05 (×22): qty 1

## 2021-05-05 MED ORDER — SODIUM CHLORIDE 0.9 % IV SOLN
2.0000 g | Freq: Once | INTRAVENOUS | Status: AC
  Administered 2021-05-05: 2 g via INTRAVENOUS
  Filled 2021-05-05: qty 2

## 2021-05-05 MED ORDER — IPRATROPIUM-ALBUTEROL 0.5-2.5 (3) MG/3ML IN SOLN
3.0000 mL | Freq: Four times a day (QID) | RESPIRATORY_TRACT | Status: DC
  Administered 2021-05-06 (×2): 3 mL via RESPIRATORY_TRACT
  Filled 2021-05-05 (×2): qty 3

## 2021-05-05 MED ORDER — ACETAMINOPHEN 325 MG PO TABS
650.0000 mg | ORAL_TABLET | ORAL | Status: DC | PRN
  Administered 2021-05-11: 650 mg via ORAL
  Filled 2021-05-05: qty 2

## 2021-05-05 MED ORDER — LISINOPRIL 10 MG PO TABS
10.0000 mg | ORAL_TABLET | Freq: Every day | ORAL | Status: DC
  Administered 2021-05-06 – 2021-05-16 (×7): 10 mg via ORAL
  Filled 2021-05-05 (×9): qty 1

## 2021-05-05 MED ORDER — PHENYLEPHRINE-MINERAL OIL-PET 0.25-14-74.9 % RE OINT
1.0000 "application " | TOPICAL_OINTMENT | Freq: Two times a day (BID) | RECTAL | Status: DC | PRN
  Filled 2021-05-05: qty 57

## 2021-05-05 MED ORDER — APIXABAN 5 MG PO TABS
5.0000 mg | ORAL_TABLET | Freq: Two times a day (BID) | ORAL | Status: DC
  Administered 2021-05-05 – 2021-05-16 (×22): 5 mg via ORAL
  Filled 2021-05-05 (×22): qty 1

## 2021-05-05 MED ORDER — IOHEXOL 350 MG/ML SOLN
100.0000 mL | Freq: Once | INTRAVENOUS | Status: AC | PRN
  Administered 2021-05-05: 100 mL via INTRAVENOUS

## 2021-05-05 MED ORDER — PREDNISONE 20 MG PO TABS
40.0000 mg | ORAL_TABLET | Freq: Every day | ORAL | Status: AC
  Administered 2021-05-06 – 2021-05-10 (×5): 40 mg via ORAL
  Filled 2021-05-05 (×5): qty 2

## 2021-05-05 MED ORDER — METHYLPREDNISOLONE SODIUM SUCC 125 MG IJ SOLR
125.0000 mg | Freq: Once | INTRAMUSCULAR | Status: AC
  Administered 2021-05-05: 125 mg via INTRAVENOUS
  Filled 2021-05-05: qty 2

## 2021-05-05 MED ORDER — SODIUM CHLORIDE 0.9 % IV SOLN
INTRAVENOUS | Status: DC | PRN
  Administered 2021-05-05 – 2021-05-12 (×2): 250 mL via INTRAVENOUS

## 2021-05-05 MED ORDER — VANCOMYCIN HCL IN DEXTROSE 1-5 GM/200ML-% IV SOLN
1000.0000 mg | Freq: Once | INTRAVENOUS | Status: AC
  Administered 2021-05-05: 1000 mg via INTRAVENOUS
  Filled 2021-05-05: qty 200

## 2021-05-05 MED ORDER — OMEGA-3-ACID ETHYL ESTERS 1 G PO CAPS
1.0000 g | ORAL_CAPSULE | Freq: Every day | ORAL | Status: DC
  Administered 2021-05-06 – 2021-05-16 (×11): 1 g via ORAL
  Filled 2021-05-05 (×11): qty 1

## 2021-05-05 MED ORDER — METRONIDAZOLE 500 MG/100ML IV SOLN
500.0000 mg | Freq: Once | INTRAVENOUS | Status: AC
  Administered 2021-05-05: 500 mg via INTRAVENOUS
  Filled 2021-05-05: qty 100

## 2021-05-05 MED ORDER — POLYETHYLENE GLYCOL 3350 17 G PO PACK
17.0000 g | PACK | Freq: Every day | ORAL | Status: DC
  Administered 2021-05-06 – 2021-05-16 (×6): 17 g via ORAL
  Filled 2021-05-05 (×8): qty 1

## 2021-05-05 MED ORDER — SODIUM CHLORIDE 0.9 % IV SOLN
500.0000 mg | INTRAVENOUS | Status: AC
  Administered 2021-05-05: 500 mg via INTRAVENOUS
  Filled 2021-05-05: qty 500

## 2021-05-05 MED ORDER — METOPROLOL SUCCINATE ER 50 MG PO TB24
50.0000 mg | ORAL_TABLET | Freq: Every day | ORAL | Status: DC
  Administered 2021-05-06 – 2021-05-16 (×8): 50 mg via ORAL
  Filled 2021-05-05 (×11): qty 1

## 2021-05-05 MED ORDER — LACTATED RINGERS IV BOLUS
1000.0000 mL | Freq: Once | INTRAVENOUS | Status: AC
  Administered 2021-05-05: 1000 mL via INTRAVENOUS

## 2021-05-05 MED ORDER — AZITHROMYCIN 250 MG PO TABS
500.0000 mg | ORAL_TABLET | Freq: Every day | ORAL | Status: AC
  Administered 2021-05-06 – 2021-05-09 (×4): 500 mg via ORAL
  Filled 2021-05-05 (×4): qty 2

## 2021-05-05 MED ORDER — ONDANSETRON HCL 4 MG/2ML IJ SOLN
4.0000 mg | Freq: Four times a day (QID) | INTRAMUSCULAR | Status: DC | PRN
Start: 2021-05-05 — End: 2021-05-16

## 2021-05-05 MED ORDER — ROSUVASTATIN CALCIUM 10 MG PO TABS
20.0000 mg | ORAL_TABLET | Freq: Every day | ORAL | Status: DC
  Administered 2021-05-06 – 2021-05-16 (×11): 20 mg via ORAL
  Filled 2021-05-05 (×11): qty 2

## 2021-05-05 MED ORDER — OMEGA-3 500 MG PO CAPS: 500.0000 mg | ORAL_CAPSULE | Freq: Every day | ORAL | Status: DC

## 2021-05-05 MED ORDER — IPRATROPIUM-ALBUTEROL 0.5-2.5 (3) MG/3ML IN SOLN
9.0000 mL | Freq: Once | RESPIRATORY_TRACT | Status: AC
  Administered 2021-05-05: 9 mL via RESPIRATORY_TRACT
  Filled 2021-05-05: qty 3

## 2021-05-05 MED ORDER — FUROSEMIDE 20 MG PO TABS
20.0000 mg | ORAL_TABLET | Freq: Every day | ORAL | Status: DC
  Administered 2021-05-06 – 2021-05-11 (×5): 20 mg via ORAL
  Filled 2021-05-05 (×6): qty 1

## 2021-05-05 NOTE — Progress Notes (Signed)
PHARMACY -  BRIEF ANTIBIOTIC NOTE   Pharmacy has received consult(s) for Vancomycin, cefepime from an ED provider.  The patient's profile has been reviewed for ht/wt/allergies/indication/available labs.    One time order(s) placed by ED EM for Cefepime 2 gm and Vancomycin 1 gram  Further antibiotics/pharmacy consults should be ordered by admitting physician if indicated.                       Thank you, Aston Lieske A 05/05/2021  10:55 AM

## 2021-05-05 NOTE — ED Notes (Signed)
Patient transported to CT 

## 2021-05-05 NOTE — H&P (Signed)
[facility] History and Physical  Garon Melander PYK:[id number] DOB: October 13, 1951 DOA: 05/05/2021  Referring physician: Dr. Katrinka Blazing PCP: Theotis Burrow, NP (Inactive)   Chief Complaint: back pain, chest pain  HPI: Thomas Tanner is a 70 y.o. male with history of CHF, COPD on 3-1/2 L home oxygen, tobacco use disorder, recent saddle pulmonary embolism in August 2021, CAD, hypertension, chronic right sided low back pain with sciatica, who presents for back and chest pain.  Patient reports the primary reason for presentation today was due to chest pains.  He reports that were sharp and very short in duration, started this morning, located centrally in his chest, not associated with nausea or sweating.  He did not think that he felt like heart attack pains but he wanted to come and be evaluated anyways.  He has also been in significant distress because of constipation and rectal pain.  Reports that starting last week and he is extremely constipated, passed a very large caliber stool which he felt caused trauma.  Subsequently he is continue to be constipated and had a very painful rectum.  On history provided to ED physician he also endorsed a ripping tearing sensation in his abdomen as well.  With me he denied any current abdominal pain.  In the ED initial vital signs notable for tachycardia to the 120s and tachypnea into the 30s as well as a soft-blood pressure of 105/70, this subsequently improved significantly with pulse in the low 100s, normal respiratory rate, and very mild hypertension.  Initial labs showed BMP at baseline, LFTs with mild elevation of total bili at 1.5, CBC with white count elevation of 14.  Initial troponin borderline positive at 18 and was flat at 20 two hours later.  Initial lactic acid was elevated at 5.7, repeat 2-1/2 hours later was above assay of 11.  Initial INR was 1.5, PTT normal.  COVID test was negative.  UA was not suspicious for infection.  EKG  showed sinus tachycardia but was largely unchanged from prior.  Chest x-ray showed stable cardiomegaly and pulmonary vascular congestion.  Due to significant cardiovascular disease and sensation of chest/abdominal tearing, a CT chest abdomen and pelvis was obtained which showed diffuse atherosclerosis but no evidence of dissection, resolution of bilateral pulmonary emboli, and no acute intra-abdominal or pelvic findings.  He was treated with nebulizers, methylprednisone, 2 L LR bolus, and broad-spectrum antibiotics (though there were incidentally noted bilateral renal masses noted).  Given overall presentation as well as lactic acidosis he was admitted for further work-up and management.  Review of Systems:  Pertinent positives and negative per HPI, all others reviewed and negative  Past Medical History:  Diagnosis Date  . Arthritis   . CHF (congestive heart failure) (HCC)   . COPD (chronic obstructive pulmonary disease) (HCC)   . Diabetes (HCC)   . Hyperlipidemia   . Hypertension   . Kidney stone   . Obesity   . Tachycardia   . TIA (transient ischemic attack)    Past Surgical History:  Procedure Laterality Date  . CORONARY/GRAFT ACUTE MI REVASCULARIZATION N/A 12/08/2018   Procedure: Coronary/Graft Acute MI Revascularization;  Surgeon: Alwyn Pea, MD;  Location: [facility] INVASIVE CV LAB;  Service: Cardiovascular;  Laterality: N/A;  . KIDNEY STONE SURGERY    . left arm surgery  1963  . LEFT HEART CATH AND CORONARY ANGIOGRAPHY N/A 12/08/2018   Procedure: LEFT HEART CATH AND CORONARY ANGIOGRAPHY;  Surgeon: Alwyn Pea, MD;  Location: [facility] INVASIVE CV  LAB;  Service: Cardiovascular;  Laterality: N/A;  . PULMONARY THROMBECTOMY N/A 08/03/2020   Procedure: PULMONARY THROMBECTOMY / THROMBOLYSIS;  Surgeon: Annice Needyew, Jason S, MD;  Location: [facility] INVASIVE CV LAB;  Service: Cardiovascular;  Laterality: N/A;   Social History:  reports that he has been smoking cigarettes. He has been smoking  about 0.50 packs per day. He has never used smokeless tobacco. He reports that he does not drink alcohol and does not use drugs.  Allergies  Allergen Reactions  . Morphine Anxiety and Other (See Comments)    Hallucinations  . Morphine And Related Anxiety    Family History  Problem Relation Age of Onset  . Alzheimer's disease Mother   . CAD Father      Prior to Admission medications   Medication Sig Start Date End Date Taking? Authorizing Provider  lisinopril (ZESTRIL) 10 MG tablet TAKE 1 TABLET BY MOUTH ONCE DAILY.  PATIENT NEEDS APPOINTMENT FOR REFILLS 04/20/21   Lyndon CodeKhan, Fozia M, MD  metoprolol succinate (TOPROL-XL) 50 MG 24 hr tablet Take 1 tablet by mouth once daily 04/20/21   Lyndon CodeKhan, Fozia M, MD  apixaban (ELIQUIS) 5 MG TABS tablet Take 1 tablet (5 mg total) by mouth 2 (two) times daily. 08/16/20 02/12/21  Theotis BurrowHarris, Taylor S, NP  famotidine (PEPCID) 20 MG tablet Take 1 tablet (20 mg total) by mouth 2 (two) times daily. 08/30/20   Carlean JewsBoscia, Heather E, NP  furosemide (LASIX) 20 MG tablet Take 1 tablet by mouth once daily 04/20/21   Lyndon CodeKhan, Fozia M, MD  Boris LownKrill Oil (OMEGA-3) 500 MG CAPS Take 1 tab po daily Patient taking differently: Take 500 mg by mouth daily.  06/04/19   Carlean JewsBoscia, Heather E, NP  nicotine (NICODERM CQ - DOSED IN MG/24 HOURS) 21 mg/24hr patch Place 1 patch (21 mg total) onto the skin daily. 08/01/20   Carlean JewsBoscia, Heather E, NP  rosuvastatin (CRESTOR) 20 MG tablet Take 1 tablet (20 mg total) by mouth daily. 05/16/20   Carlean JewsBoscia, Heather E, NP  traMADol (ULTRAM) 50 MG tablet Take 1 tablet (50 mg total) by mouth every 8 (eight) hours as needed. Patient taking differently: Take 50 mg by mouth every 8 (eight) hours as needed for moderate pain.  08/01/20   Carlean JewsBoscia, Heather E, NP  VENTOLIN HFA 108 (90 Base) MCG/ACT inhaler Inhale 2 puffs into the lungs every 6 (six) hours as needed for wheezing or shortness of breath. 08/09/20   Carlean JewsBoscia, Heather E, NP   Physical Exam: Vitals:   05/05/21 1000 05/05/21 1130  05/05/21 1200 05/05/21 1230  BP: (!) 121/59 140/75 (!) 144/67 (!) 148/70  Pulse: (!) 106 96 (!) 102 (!) 106  Resp: 16 17 18 19   Temp:      TempSrc:      SpO2: 95% 98% 96% 96%  Weight:      Height:        Wt Readings from Last 3 Encounters:  05/05/21 (!) 190.5 kg  09/28/20 (!) 190.5 kg  08/19/20 (!) 190.5 kg     . General:  Appears calm, no acute distress . Eyes: PERRL, normal lids, irises & conjunctiva . ENT: grossly normal hearing, lips & tongue . Neck: no masses . Cardiovascular: Unable to auscultate heart sounds due to body habitus. No LE edema. . Telemetry: SR . Respiratory: Moderately increased respiratory effort, diffuse expiratory faint wheezes with moderately good air movement throughout. . Abdomen: soft, ntnd . GU: Limited exam due to difficulty with positioning, lighting, and body habitus, but no obvious  tears seen.  Some small hemorrhoids appreciated. . Skin: no rash or induration seen on limited exam . Musculoskeletal: grossly normal tone BUE/BLE . Psychiatric: grossly normal mood and affect, speech fluent and appropriate . Neurologic: grossly non-focal.          Labs on Admission:  Basic Metabolic Panel: Recent Labs  Lab 05/05/21 0851  NA 132*  K 4.0  CL 94*  CO2 24  GLUCOSE 158*  BUN 14  CREATININE 1.31*  CALCIUM 8.9  MG 1.9   Liver Function Tests: Recent Labs  Lab 05/05/21 0851  AST 24  ALT 12  ALKPHOS 74  BILITOT 1.5*  PROT 7.4  ALBUMIN 3.6   No results for input(s): LIPASE, AMYLASE in the last 168 hours. No results for input(s): AMMONIA in the last 168 hours. CBC: Recent Labs  Lab 05/05/21 0851  WBC 14.8*  HGB 14.9  HCT 45.7  MCV 91.2  PLT 249   Cardiac Enzymes: No results for input(s): CKTOTAL, CKMB, CKMBINDEX, TROPONINI in the last 168 hours.  BNP (last 3 results) Recent Labs    08/02/20 1107 08/03/20 2038 05/05/21 0851  BNP 503.4* 218.8* 81.8    ProBNP (last 3 results) No results for input(s): PROBNP in the last  8760 hours.  CBG: No results for input(s): GLUCAP in the last 168 hours.  Radiological Exams on Admission: DG Chest 2 View  Result Date: 05/05/2021 CLINICAL DATA:  Chest pain. EXAM: CHEST - 2 VIEW COMPARISON:  August 03, 2020. FINDINGS: Stable cardiomegaly with mild central pulmonary vascular congestion. No pneumothorax or pleural effusion is noted. No consolidative process is noted. Bony thorax is unremarkable. IMPRESSION: Stable cardiomegaly with central pulmonary vascular congestion. Electronically Signed   By: Lupita Raider M.D.   On: 05/05/2021 09:32   CT Angio Chest/Abd/Pel for Dissection W and/or Wo Contrast  Result Date: 05/05/2021 CLINICAL DATA:  Acute abdominal pain, concern for dissection, remote history of saddle pulmonary embolus 08/02/2020 EXAM: CT ANGIOGRAPHY CHEST, ABDOMEN AND PELVIS TECHNIQUE: Non-contrast CT of the chest was initially obtained. Multidetector CT imaging through the chest, abdomen and pelvis was performed using the standard protocol during bolus administration of intravenous contrast. Multiplanar reconstructed images and MIPs were obtained and reviewed to evaluate the vascular anatomy. CONTRAST:  OMNIPAQUE IOHEXOL 350 MG/ML SOLN COMPARISON:  08/02/2020, 05/12/2013 FINDINGS: CTA CHEST FINDINGS Cardiovascular: Initial noncontrast imaging demonstrates calcific atherosclerosis of the aorta. No significant hyperdense acute intramural hematoma. Exam is limited with respiratory motion artifact. Native coronary atherosclerosis. Postcontrast, the thoracic aorta appears intact. Negative for acute dissection or aneurysm. No mediastinal hemorrhage or hematoma. Central pulmonary arteries appear patent and normal in caliber. No large central PE. Central veins including the innominate veins and SVC are patent. No veno-occlusive process. Overall normal heart size.  No pericardial effusion. Mediastinum/Nodes: Heterogeneous nodular left inferior thyroid enlargement measuring 3.8  cm. Trachea and central airways are patent. Esophagus nondilated. No hiatal hernia. Negative for adenopathy. Lungs/Pleura: Minor scattered areas of parenchymal scarring. No acute airspace process, collapse or consolidation. Negative for interstitial disease or edema. No pleural abnormality, effusion or pneumothorax. Musculoskeletal: Degenerative changes noted of the spine. Old rib fractures evident. No acute osseous finding. Sternum intact. Review of the MIP images confirms the above findings. CTA ABDOMEN AND PELVIS FINDINGS VASCULAR Aorta: Intact abdominal aorta. Negative for acute dissection or aneurysm. No evidence of rupture or retroperitoneal hematoma. Atherosclerotic changes noted throughout the abdominal aorta. Celiac: Origin remains patent including its branches centrally. SMA: Origin remains patent including  its branches in the central mesentery. Renals: Calcified origins but appear to remain patent. IMA: Calcified but appears to remain patent off the distal aorta. Inflow: Tortuosity and atherosclerosis of the iliac vessels without significant inflow disease or occlusion. Limited assessment because of contrast opacification. Veins: No abdominopelvic veno-occlusive process. Review of the MIP images confirms the above findings. NON-VASCULAR Hepatobiliary: No focal liver abnormality is seen. No gallstones, gallbladder wall thickening, or biliary dilatation. Pancreas: Unremarkable. No pancreatic ductal dilatation or surrounding inflammatory changes. Spleen: Normal in size without focal abnormality. Adrenals/Urinary Tract: Normal adrenal glands for age. Right kidney demonstrates a exophytic posterior hypodense lesion without definite enhancement measuring 10 cm compatible with a renal cyst. Left kidney also has a heterogeneous posterior mixed density exophytic lesion measuring up to 9.4 cm remaining indeterminate for solid components by single-phase imaging. Recommend further evaluation with nonemergent  abdominal MRI without and with contrast. No acute renal obstruction or hydronephrosis. No hydroureter. Bladder unremarkable. Stomach/Bowel: Limited without oral contrast. Negative for bowel obstruction, significant dilatation, ileus, or free air. Normal appearing appendix containing air. No free fluid, fluid collection, hemorrhage, hematoma, abscess or ascites. Lymphatic: No bulky adenopathy. Reproductive: No significant finding by CT. Other: Nonspecific mild presacral strandy edema without clear etiology or mass. Musculoskeletal: Degenerative changes noted of the spine. Advanced right hip degenerative arthropathy with acetabular and femoral head cystic change. No acute osseous finding or fracture. Review of the MIP images confirms the above findings. IMPRESSION: Thoracoabdominal atherosclerosis without dissection, aneurysm or other acute vascular process. Resolved bilateral pulmonary emboli compared to 08/02/2020. No other acute intrathoracic finding. 9.4 cm exophytic posterolateral left renal mass, indeterminate for cystic and solid components by single-phase imaging. Renal neoplasm not excluded. Recommend further evaluation with nonemergent abdominal MRI without and with contrast. Additional 10 cm right posterior renal cyst. No other acute intra-abdominal or pelvic finding. Electronically Signed   By: Judie Petit.  Shick M.D.   On: 05/05/2021 11:17    EKG: Independently reviewed.  Sinus tachycardia, normal axis, poor R wave progression, no significant ST elevations or depressions, no T wave inversions.  Compared to prior from August 24 of last year there are no significant changes.  Assessment/Plan Active Problems:   Chronic right-sided low back pain with right-sided sciatica   Hypertension   CAD in native artery   Morbid obesity with BMI of 50.0-59.9, adult (HCC)   Cigarette smoker motivated to quit   Acute on chronic heart failure (HCC)   Acute saddle pulmonary embolism (HCC)   Lactic acidosis  Thomas Tanner is a 70 y.o. male with history of CHF, COPD on 3-1/2 L home oxygen, tobacco use disorder, recent saddle pulmonary embolism in August 2021, CAD, hypertension, chronic right sided low back pain with sciatica, who presents for chest and rectal pain.  #Chest pain #Elevated troponin #COPD, possible exacerbation Patient's chest pain history not suggestive of cardiac ischemia.  EKG is unchanged from prior without any ischemic changes.  Troponin is mildly elevated, suspect this is likely due to a combination of all of his comorbidities (O2 dependent COPD, hypertension, tachycardia, CAD, etc.).  Possibly some element of COPD exacerbation, though patient is likely wheezy at baseline.  Will rule out for cardiac etiology and treat as COPD exacerbation for the time being. -Trend troponin - Telemetry - Prednisone p.o. x7 days - Azithromycin x5 days -Continue home O2 of 3.5 L, uptitrate as needed  #Constipation #Rectal pain Nothing seen on limited external exam.  MiraLAX daily, Preparation H as needed.  Consider  topical nitro as needed.  #Lactic acidosis Initial elevations appears to be spurious, third level was normal.  Started on broad-spectrum antibiotics but these will be discontinued.  #Bilateral renal masses Review of prior imaging shows that bilateral masses were present in 2014 and have both grown approximately 5 cm larger since then.  He should have outpatient follow-up with urology.  #Known Medical Problems History of PE-continue apixaban twice daily GERD-continue famotidine CHF-continue Lasix, metoprolol Hypertension- continue lisinopril Hyperlipidemia-continue rosuvastatin  Code Status: Full code, confirmed DVT Prophylaxis: Continue home anticoagulation regimen Family Communication: None Disposition Plan: Inpatient, med-surg  Time spent: 28 min  Venora Maples MD/MP[facility]  Note:  This document was prepared using Conservation officer, historic buildings  and may include unintentional dictation errors.

## 2021-05-05 NOTE — ED Triage Notes (Addendum)
C/O intermittent CP since 0600.  18 g SL left forearm.  324 ASA.  VS wnl.  HR:  110 with 1 degree heart block.  RR:  36.  Also c/o constipation with a 'tearing sensation' on Sunday.  C/O rectal pain.

## 2021-05-05 NOTE — ED Notes (Signed)
Handoff to next C-pod RN Taylor.  

## 2021-05-05 NOTE — ED Provider Notes (Addendum)
[facility] Emergency Department Provider Note  ____________________________________________   Event Date/Time   First MD Initiated Contact with Patient 05/05/21 [phone]     (approximate)  I have reviewed the triage vital signs and the nursing notes.   HISTORY  Chief Complaint Chest Pain   HPI Thomas Tanner is a 70 y.o. male with a past medical history of COPD on 3.5 L of oxygen at baseline, CHF, HTN, HDL, kidney stones, obesity, TIA, and arthritis who presents for assessment of intermittent chest pain since around 6 AM this morning as well as appearing pain in his lower back that he states has been ongoing over the last 5 days.  Patient states that 7 days ago he had a massive bowel movement that was nonbloody dark sticky or tarry but brown normal stool a very large amount since then he feels felt like a tearing in his lower back.  He denies any abdominal pain, change in chronic shortness of breath or cough, headache and earache, sore throat, fevers does endorse some urinary hesitancy.  He denies any blood in his urine or burning with urination.  No rashes or focal extremity pain focal weakness or recent falls.  No other acute concerns at this time.         Past Medical History:  Diagnosis Date  . Arthritis   . CHF (congestive heart failure) (HCC)   . COPD (chronic obstructive pulmonary disease) (HCC)   . Diabetes (HCC)   . Hyperlipidemia   . Hypertension   . Kidney stone   . Obesity   . Tachycardia   . TIA (transient ischemic attack)     Patient Active Problem List   Diagnosis Date Noted  . Atherosclerosis of aorta (HCC) 09/28/2020  . Acute on chronic heart failure (HCC) 08/02/2020  . Acute saddle pulmonary embolism (HCC) 08/02/2020  . Hypoxia 08/01/2020  . Weakness 08/01/2020  . Abnormality of gait and mobility 08/01/2020  . AKI (acute kidney injury) (HCC) 07/21/2020  . Hypotension 07/20/2020  . Leukocytosis 07/20/2020  . Cellulitis  of right lower extremity 07/20/2020  . Pressure injury of skin 07/12/2020  . Acute respiratory failure with hypoxia (HCC) 07/10/2020  . COPD with acute exacerbation (HCC)   . Elevated troponin   . Chronic diastolic CHF (congestive heart failure) (HCC) 05/24/2020  . Anasarca   . Acute renal failure superimposed on stage 3a chronic kidney disease (HCC)   . Hyperlipidemia   . Tobacco abuse   . Shortness of breath 04/28/2020  . Upper respiratory tract infection due to COVID-19 virus 04/28/2020  . Edema 04/28/2020  . Encounter for general adult medical examination with abnormal findings 01/15/2019  . Acute non-recurrent maxillary sinusitis 01/15/2019  . Gastroesophageal reflux disease without esophagitis 01/15/2019  . CAD in native artery 12/16/2018  . Morbid obesity with BMI of 50.0-59.9, adult (HCC) 12/16/2018  . Cigarette smoker motivated to quit 12/16/2018  . Chronic pain of right knee 08/04/2018  . Mild intermittent asthma 08/04/2018  . Impaired fasting glucose 04/03/2018  . Low back pain with right-sided sciatica 03/05/2018  . Chronic right-sided low back pain with right-sided sciatica 03/05/2018  . Pain in right hip 03/05/2018  . Hypertension 03/05/2018  . Foreign body in bladder and urethra 06/09/2013  . Kidney stone 06/09/2013  . Ureteric stone 06/09/2013  . Kidney stone 06/09/2013    Past Surgical History:  Procedure Laterality Date  . CORONARY/GRAFT ACUTE MI REVASCULARIZATION N/A 12/08/2018   Procedure: Coronary/Graft Acute MI Revascularization;  Surgeon: Alwyn Pea, MD;  Location: [facility] INVASIVE CV LAB;  Service: Cardiovascular;  Laterality: N/A;  . KIDNEY STONE SURGERY    . left arm surgery  1963  . LEFT HEART CATH AND CORONARY ANGIOGRAPHY N/A 12/08/2018   Procedure: LEFT HEART CATH AND CORONARY ANGIOGRAPHY;  Surgeon: Alwyn Pea, MD;  Location: [facility] INVASIVE CV LAB;  Service: Cardiovascular;  Laterality: N/A;  . PULMONARY THROMBECTOMY N/A 08/03/2020    Procedure: PULMONARY THROMBECTOMY / THROMBOLYSIS;  Surgeon: Annice Needy, MD;  Location: [facility] INVASIVE CV LAB;  Service: Cardiovascular;  Laterality: N/A;    Prior to Admission medications   Medication Sig Start Date End Date Taking? Authorizing Provider  lisinopril (ZESTRIL) 10 MG tablet TAKE 1 TABLET BY MOUTH ONCE DAILY.  PATIENT NEEDS APPOINTMENT FOR REFILLS 04/20/21   Lyndon Code, MD  metoprolol succinate (TOPROL-XL) 50 MG 24 hr tablet Take 1 tablet by mouth once daily 04/20/21   Lyndon Code, MD  apixaban (ELIQUIS) 5 MG TABS tablet Take 1 tablet (5 mg total) by mouth 2 (two) times daily. 08/16/20 02/12/21  Theotis Burrow, NP  famotidine (PEPCID) 20 MG tablet Take 1 tablet (20 mg total) by mouth 2 (two) times daily. 08/30/20   Carlean Jews, NP  furosemide (LASIX) 20 MG tablet Take 1 tablet by mouth once daily 04/20/21   Lyndon Code, MD  Boris Lown Oil (OMEGA-3) 500 MG CAPS Take 1 tab po daily Patient taking differently: Take 500 mg by mouth daily.  06/04/19   Carlean Jews, NP  nicotine (NICODERM CQ - DOSED IN MG/24 HOURS) 21 mg/24hr patch Place 1 patch (21 mg total) onto the skin daily. 08/01/20   Carlean Jews, NP  rosuvastatin (CRESTOR) 20 MG tablet Take 1 tablet (20 mg total) by mouth daily. 05/16/20   Carlean Jews, NP  traMADol (ULTRAM) 50 MG tablet Take 1 tablet (50 mg total) by mouth every 8 (eight) hours as needed. Patient taking differently: Take 50 mg by mouth every 8 (eight) hours as needed for moderate pain.  08/01/20   Carlean Jews, NP  VENTOLIN HFA 108 (90 Base) MCG/ACT inhaler Inhale 2 puffs into the lungs every 6 (six) hours as needed for wheezing or shortness of breath. 08/09/20   Carlean Jews, NP    Allergies Morphine and Morphine and related  Family History  Problem Relation Age of Onset  . Alzheimer's disease Mother   . CAD Father     Social History Social History   Tobacco Use  . Smoking status: Light Tobacco Smoker    Packs/day: 0.50     Types: Cigarettes    Last attempt to quit: 07/13/2020    Years since quitting: 0.8  . Smokeless tobacco: Never Used  . Tobacco comment: states "I quit 3 weeks ago."  Substance Use Topics  . Alcohol use: No  . Drug use: No    Review of Systems  Review of Systems  Constitutional: Positive for malaise/fatigue. Negative for chills and fever.  HENT: Negative for sore throat.   Eyes: Negative for pain.  Respiratory: Positive for cough ( chronic) and shortness of breath ( chronic). Negative for stridor.   Cardiovascular: Positive for chest pain.  Gastrointestinal: Negative for vomiting.  Genitourinary: Negative for dysuria.  Musculoskeletal: Negative for myalgias.  Skin: Negative for rash.  Neurological: Positive for weakness. Negative for seizures, loss of consciousness and headaches.  Psychiatric/Behavioral: Negative for suicidal ideas.  All other systems reviewed and are  negative.     ____________________________________________   PHYSICAL EXAM:  VITAL SIGNS: ED Triage Vitals  Enc Vitals Group     BP 05/05/21 0849 105/70     Pulse Rate 05/05/21 0849 (!) 129     Resp 05/05/21 0849 (!) 36     Temp 05/05/21 0849 97.8 F (36.6 C)     Temp Source 05/05/21 0849 Oral     SpO2 05/05/21 0849 91 %     Weight 05/05/21 0846 (!) 419 lb 15.6 oz (190.5 kg)     Height 05/05/21 0846  (1.854 m)     Head Circumference --      Peak Flow --      Pain Score 05/05/21 0846 0     Pain Loc --      Pain Edu? --      Excl. in GC? --    Vitals:   05/05/21 1200 05/05/21 1230  BP: (!) 144/67 (!) 148/70  Pulse: (!) 102 (!) 106  Resp: 18 19  Temp:    SpO2: 96% 96%   Physical Exam Vitals and nursing note reviewed.  Constitutional:      Appearance: He is well-developed. He is obese.  HENT:     Head: Normocephalic and atraumatic.     Right Ear: External ear normal.     Left Ear: External ear normal.     Nose: Nose normal.  Eyes:     Conjunctiva/sclera: Conjunctivae normal.   Cardiovascular:     Rate and Rhythm: Regular rhythm. Tachycardia present.     Heart sounds: No murmur heard.   Pulmonary:     Effort: Tachypnea, accessory muscle usage and respiratory distress present.     Breath sounds: Examination of the right-upper field reveals wheezing. Examination of the left-upper field reveals wheezing. Examination of the right-middle field reveals wheezing. Examination of the left-middle field reveals wheezing. Examination of the right-lower field reveals wheezing. Examination of the left-lower field reveals wheezing. Decreased breath sounds and wheezing present.  Abdominal:     Palpations: Abdomen is soft.     Tenderness: There is no abdominal tenderness. There is no right CVA tenderness or left CVA tenderness.  Musculoskeletal:     Cervical back: Neck supple.  Skin:    General: Skin is warm and dry.     Capillary Refill: Capillary refill takes less than 2 seconds.  Neurological:     Mental Status: He is alert and oriented to person, place, and time.  Psychiatric:        Mood and Affect: Mood normal.     No point tenderness over the C/T/L-spine.  No overlying skin changes over the lower back.  Patient has symmetric strength in his lower extremities.  2+ radial and DP pulses.  Sensation intact light touch of all extremities. ____________________________________________   LABS (all labs ordered are listed, but only abnormal results are displayed)  Labs Reviewed  BASIC METABOLIC PANEL - Abnormal; Notable for the following components:      Result Value   Sodium 132 (*)    Chloride 94 (*)    Glucose, Bld 158 (*)    Creatinine, Ser 1.31 (*)    GFR, Estimated 59 (*)    All other components within normal limits  CBC - Abnormal; Notable for the following components:   WBC 14.8 (*)    All other components within normal limits  HEPATIC FUNCTION PANEL - Abnormal; Notable for the following components:   Total Bilirubin 1.5 (*)  Bilirubin, Direct 0.6 (*)     All other components within normal limits  LACTIC ACID, PLASMA - Abnormal; Notable for the following components:   Lactic Acid, Venous 5.7 (*)    All other components within normal limits  LACTIC ACID, PLASMA - Abnormal; Notable for the following components:   Lactic Acid, Venous >11.0 (*)    All other components within normal limits  PROTIME-INR - Abnormal; Notable for the following components:   Prothrombin Time 17.9 (*)    INR 1.5 (*)    All other components within normal limits  URINALYSIS, COMPLETE (UACMP) WITH MICROSCOPIC - Abnormal; Notable for the following components:   Color, Urine AMBER (*)    APPearance HAZY (*)    Hgb urine dipstick SMALL (*)    Ketones, ur 20 (*)    Protein, ur 30 (*)    Leukocytes,Ua TRACE (*)    Bacteria, UA RARE (*)    All other components within normal limits  TROPONIN I (HIGH SENSITIVITY) - Abnormal; Notable for the following components:   Troponin I (High Sensitivity) 18 (*)    All other components within normal limits  TROPONIN I (HIGH SENSITIVITY) - Abnormal; Notable for the following components:   Troponin I (High Sensitivity) 20 (*)    All other components within normal limits  RESP PANEL BY RT-PCR (FLU A&B, COVID) ARPGX2  CULTURE, BLOOD (SINGLE)  URINE CULTURE  CULTURE, BLOOD (SINGLE)  MAGNESIUM  APTT  BRAIN NATRIURETIC PEPTIDE  PROCALCITONIN  LACTIC ACID, PLASMA  LACTIC ACID, PLASMA   ____________________________________________  EKG  Sent tachycardia with ventricular to 132, normal axis, unremarkable intervals with some nonspecific changes in inferior anterior lateral leads. ____________________________________________  RADIOLOGY  ED MD interpretation: Chest x-ray has stable cardiomegaly and some central congestion without other overt edema large effusion pneumothorax or other clear acute intrathoracic process.  CTA dissection protocol chest abdomen pelvis remarkable for evidence of atherosclerosis without dissection or  aneurysm or other clear acute vascular process.  No significant occlusion.  No evidence of PE.  There is an exophytic posterior lateral left renal mass will require further imaging.  There is also posterior right renal cyst.  No other clear acute abdominal pelvic or thoracic process.  Official radiology report(s): DG Chest 2 View  Result Date: 05/05/2021 CLINICAL DATA:  Chest pain. EXAM: CHEST - 2 VIEW COMPARISON:  August 03, 2020. FINDINGS: Stable cardiomegaly with mild central pulmonary vascular congestion. No pneumothorax or pleural effusion is noted. No consolidative process is noted. Bony thorax is unremarkable. IMPRESSION: Stable cardiomegaly with central pulmonary vascular congestion. Electronically Signed   By: Lupita Raider M.D.   On: 05/05/2021 09:32   CT Angio Chest/Abd/Pel for Dissection W and/or Wo Contrast  Result Date: 05/05/2021 CLINICAL DATA:  Acute abdominal pain, concern for dissection, remote history of saddle pulmonary embolus 08/02/2020 EXAM: CT ANGIOGRAPHY CHEST, ABDOMEN AND PELVIS TECHNIQUE: Non-contrast CT of the chest was initially obtained. Multidetector CT imaging through the chest, abdomen and pelvis was performed using the standard protocol during bolus administration of intravenous contrast. Multiplanar reconstructed images and MIPs were obtained and reviewed to evaluate the vascular anatomy. CONTRAST:  OMNIPAQUE IOHEXOL 350 MG/ML SOLN COMPARISON:  08/02/2020, 05/12/2013 FINDINGS: CTA CHEST FINDINGS Cardiovascular: Initial noncontrast imaging demonstrates calcific atherosclerosis of the aorta. No significant hyperdense acute intramural hematoma. Exam is limited with respiratory motion artifact. Native coronary atherosclerosis. Postcontrast, the thoracic aorta appears intact. Negative for acute dissection or aneurysm. No mediastinal hemorrhage or hematoma. Central pulmonary arteries  appear patent and normal in caliber. No large central PE. Central veins including the  innominate veins and SVC are patent. No veno-occlusive process. Overall normal heart size.  No pericardial effusion. Mediastinum/Nodes: Heterogeneous nodular left inferior thyroid enlargement measuring 3.8 cm. Trachea and central airways are patent. Esophagus nondilated. No hiatal hernia. Negative for adenopathy. Lungs/Pleura: Minor scattered areas of parenchymal scarring. No acute airspace process, collapse or consolidation. Negative for interstitial disease or edema. No pleural abnormality, effusion or pneumothorax. Musculoskeletal: Degenerative changes noted of the spine. Old rib fractures evident. No acute osseous finding. Sternum intact. Review of the MIP images confirms the above findings. CTA ABDOMEN AND PELVIS FINDINGS VASCULAR Aorta: Intact abdominal aorta. Negative for acute dissection or aneurysm. No evidence of rupture or retroperitoneal hematoma. Atherosclerotic changes noted throughout the abdominal aorta. Celiac: Origin remains patent including its branches centrally. SMA: Origin remains patent including its branches in the central mesentery. Renals: Calcified origins but appear to remain patent. IMA: Calcified but appears to remain patent off the distal aorta. Inflow: Tortuosity and atherosclerosis of the iliac vessels without significant inflow disease or occlusion. Limited assessment because of contrast opacification. Veins: No abdominopelvic veno-occlusive process. Review of the MIP images confirms the above findings. NON-VASCULAR Hepatobiliary: No focal liver abnormality is seen. No gallstones, gallbladder wall thickening, or biliary dilatation. Pancreas: Unremarkable. No pancreatic ductal dilatation or surrounding inflammatory changes. Spleen: Normal in size without focal abnormality. Adrenals/Urinary Tract: Normal adrenal glands for age. Right kidney demonstrates a exophytic posterior hypodense lesion without definite enhancement measuring 10 cm compatible with a renal cyst. Left kidney also  has a heterogeneous posterior mixed density exophytic lesion measuring up to 9.4 cm remaining indeterminate for solid components by single-phase imaging. Recommend further evaluation with nonemergent abdominal MRI without and with contrast. No acute renal obstruction or hydronephrosis. No hydroureter. Bladder unremarkable. Stomach/Bowel: Limited without oral contrast. Negative for bowel obstruction, significant dilatation, ileus, or free air. Normal appearing appendix containing air. No free fluid, fluid collection, hemorrhage, hematoma, abscess or ascites. Lymphatic: No bulky adenopathy. Reproductive: No significant finding by CT. Other: Nonspecific mild presacral strandy edema without clear etiology or mass. Musculoskeletal: Degenerative changes noted of the spine. Advanced right hip degenerative arthropathy with acetabular and femoral head cystic change. No acute osseous finding or fracture. Review of the MIP images confirms the above findings. IMPRESSION: Thoracoabdominal atherosclerosis without dissection, aneurysm or other acute vascular process. Resolved bilateral pulmonary emboli compared to 08/02/2020. No other acute intrathoracic finding. 9.4 cm exophytic posterolateral left renal mass, indeterminate for cystic and solid components by single-phase imaging. Renal neoplasm not excluded. Recommend further evaluation with nonemergent abdominal MRI without and with contrast. Additional 10 cm right posterior renal cyst. No other acute intra-abdominal or pelvic finding. Electronically Signed   By: Judie Petit.  Shick M.D.   On: 05/05/2021 11:17    ____________________________________________   PROCEDURES  Procedure(s) performed (including Critical Care):  .Critical Care Performed by: Gilles Chiquito, MD Authorized by: Gilles Chiquito, MD   Critical care provider statement:    Critical care time (minutes):  45   Critical care was necessary to treat or prevent imminent or life-threatening deterioration of  the following conditions:  Shock and sepsis   Critical care was time spent personally by me on the following activities:  Discussions with consultants, evaluation of patient's response to treatment, examination of patient, ordering and performing treatments and interventions, ordering and review of laboratory studies, ordering and review of radiographic studies, pulse oximetry, re-evaluation of patient's condition, obtaining  history from patient or surrogate and review of old charts     ____________________________________________   INITIAL IMPRESSION / ASSESSMENT AND PLAN / ED COURSE        Patient presents with above-stated history exam for assessment of some chest tightness on and off this morning as well as approximately 5 days of a tearing sensation in his lower back after a large bowel movement about 5 days ago.  Patient states that prior to this he had been constipated.  On arrival he is tachycardic with a heart rate of 129, tachypneic at 36, afebrile, normotensive with an SPO2 of 92% on 3.5 L.  With regard to his chest tightness differential includes ACS, pericarditis, myocarditis, pneumothorax, pneumonia, PE, dissection, mesenteric ischemia, metabolic derangements, kidney stone, pyelonephritis, GI etiologies and acute anemia.  Chest x-ray has stable cardiomegaly and some central congestion without other overt edema large effusion pneumothorax or other clear acute intrathoracic process.  CTA dissection protocol chest abdomen pelvis remarkable for evidence of atherosclerosis without dissection or aneurysm or other clear acute vascular process.  No significant occlusion.  No evidence of PE.  There is an exophytic posterior lateral left renal mass will require further imaging.  There is also posterior right renal cyst.  No other clear acute abdominal pelvic or thoracic process..  Chest x-ray without evidence of pneumonia or other.  Process.  Given reported ripping sensation and unstable  vital signs on arrival dissection study ordered which shows no evidence of dissection, aneurysm, PE, pneumonia or other clear acute thoracic abdominal pelvic process but does show Nexafed of mass that will require follow-up imaging to rule out possible renal neoplasm.  Per radiology abdominal vascular imaging with contrast at the celiac, SMA and IMA without clear occlusion or evidence of significant flow limitation.  BMP remarkable for sodium of 132, glucose of 158 and creatinine 1.31.  CBC with a WBC count of 14.8 with normal hemoglobin platelets.  Troponin stable d x2 at 8018 and 20 not consistent with ACS or myocarditis.  UA remarkable for some ketones, protein and trace LES with rare bacteria.  COVID and flu is negative.  Lactic acid did trend up from 5.7-11.  Unclear allergy although certainly possible patient is septic from a COPD exacerbation given he is wheezing on exam with tachypnea tachycardia and elevated blood cell count without other clear source of infection.  Patient given broad-spectrum antibiotics Solu-Medrol and duo nebs.  On reassessment he stated he felt better and his heart rate had improved to the low 100s and his BP increased to 144/67.  Given evidence of concern for sepsis from COPD exacerbation will admit to hospital service for further evaluation management.      ____________________________________________   FINAL CLINICAL IMPRESSION(S) / ED DIAGNOSES  Final diagnoses:  COPD exacerbation (HCC)  Sepsis, due to unspecified organism, unspecified whether acute organ dysfunction present (HCC)  Lactic acid acidosis  Acute low back pain, unspecified back pain laterality, unspecified whether sciatica present    Medications  ipratropium-albuterol (DUONEB) 0.5-2.5 (3) MG/3ML nebulizer solution 9 mL (9 mLs Nebulization Given 05/05/21 0944)  methylPREDNISolone sodium succinate (SOLU-MEDROL) 125 mg/2 mL injection 125 mg (125 mg Intravenous Given 05/05/21 0944)  iohexol (OMNIPAQUE)  350 MG/ML injection 100 mL (100 mLs Intravenous Contrast Given 05/05/21 1016)  ceFEPIme (MAXIPIME) 2 g in sodium chloride 0.9 % 100 mL IVPB (0 g Intravenous Stopped 05/05/21 1158)  metroNIDAZOLE (FLAGYL) IVPB 500 mg (0 mg Intravenous Stopped 05/05/21 1206)  vancomycin (VANCOCIN) IVPB 1000 mg/200 mL  premix (0 mg Intravenous Stopped 05/05/21 1206)  lactated ringers bolus 1,000 mL (0 mLs Intravenous Stopped 05/05/21 1154)  lactated ringers bolus 1,000 mL (1,000 mLs Intravenous New Bag/Given 05/05/21 1238)     ED Discharge Orders    None       Note:  This document was prepared using Dragon voice recognition software and may include unintentional dictation errors.   Gilles Chiquito, MD 05/05/21 1241    Gilles Chiquito, MD 05/05/21 1250

## 2021-05-05 NOTE — Sepsis Progress Note (Signed)
Notified provider of need to order additional  fluid bolus. 

## 2021-05-05 NOTE — ED Notes (Signed)
Taylor RN aware of assigned bed 

## 2021-05-05 NOTE — Progress Notes (Signed)
CODE SEPSIS - PHARMACY COMMUNICATION  **Broad Spectrum Antibiotics should be administered within 1 hour of Sepsis diagnosis**  Time Code Sepsis Called/Page Received: 1030  Antibiotics Ordered: Cefepime, vanc, metro  Time of 1st antibiotic administration: 1101  Additional action taken by pharmacy:    If necessary, Name of Provider/Nurse Contacted:      Angelique Blonder ,PharmD Clinical Pharmacist  05/05/2021  12:29 PM

## 2021-05-05 NOTE — Sepsis Progress Note (Signed)
Code sepsis protocol being monitored by e[employer]. 

## 2021-05-06 ENCOUNTER — Other Ambulatory Visit: Payer: Self-pay

## 2021-05-06 DIAGNOSIS — R5381 Other malaise: Secondary | ICD-10-CM

## 2021-05-06 DIAGNOSIS — K5900 Constipation, unspecified: Secondary | ICD-10-CM

## 2021-05-06 LAB — CBC
HCT: 42.9 % (ref 39.0–52.0)
Hemoglobin: 14 g/dL (ref 13.0–17.0)
MCH: 30.8 pg (ref 26.0–34.0)
MCHC: 32.6 g/dL (ref 30.0–36.0)
MCV: 94.3 fL (ref 80.0–100.0)
Platelets: 218 10*3/uL (ref 150–400)
RBC: 4.55 MIL/uL (ref 4.22–5.81)
RDW: 14.5 % (ref 11.5–15.5)
WBC: 24.6 10*3/uL — ABNORMAL HIGH (ref 4.0–10.5)
nRBC: 0 % (ref 0.0–0.2)

## 2021-05-06 LAB — BLOOD CULTURE ID PANEL (REFLEXED) - BCID2

## 2021-05-06 LAB — COMPREHENSIVE METABOLIC PANEL
ALT: 13 U/L (ref 0–44)
AST: 15 U/L (ref 15–41)
Albumin: 3.3 g/dL — ABNORMAL LOW (ref 3.5–5.0)
Alkaline Phosphatase: 62 U/L (ref 38–126)
Anion gap: 9 (ref 5–15)
BUN: 20 mg/dL (ref 8–23)
CO2: 27 mmol/L (ref 22–32)
Calcium: 8.7 mg/dL — ABNORMAL LOW (ref 8.9–10.3)
Chloride: 97 mmol/L — ABNORMAL LOW (ref 98–111)
Creatinine, Ser: 1.13 mg/dL (ref 0.61–1.24)
GFR, Estimated: >60 mL/min (ref >60)
Glucose, Bld: 112 mg/dL — ABNORMAL HIGH (ref 70–99)
Potassium: 4.7 mmol/L (ref 3.5–5.1)
Sodium: 133 mmol/L — ABNORMAL LOW (ref 135–145)
Total Bilirubin: 0.9 mg/dL (ref 0.3–1.2)
Total Protein: 6.9 g/dL (ref 6.5–8.1)

## 2021-05-06 LAB — EXPECTORATED SPUTUM ASSESSMENT W GRAM STAIN, RFLX TO RESP C

## 2021-05-06 MED ORDER — IPRATROPIUM-ALBUTEROL 0.5-2.5 (3) MG/3ML IN SOLN
3.0000 mL | Freq: Three times a day (TID) | RESPIRATORY_TRACT | Status: DC
  Administered 2021-05-06 – 2021-05-07 (×4): 3 mL via RESPIRATORY_TRACT
  Filled 2021-05-06 (×4): qty 3

## 2021-05-06 MED ORDER — SENNOSIDES-DOCUSATE SODIUM 8.6-50 MG PO TABS
1.0000 | ORAL_TABLET | Freq: Two times a day (BID) | ORAL | Status: DC
  Administered 2021-05-06 – 2021-05-16 (×13): 1 via ORAL
  Filled 2021-05-06 (×16): qty 1

## 2021-05-06 MED ORDER — LISINOPRIL 10 MG PO TABS: ORAL_TABLET | ORAL | 1 refills | Status: DC

## 2021-05-06 MED ORDER — ALBUTEROL SULFATE (2.5 MG/3ML) 0.083% IN NEBU
2.5000 mg | INHALATION_SOLUTION | RESPIRATORY_TRACT | Status: DC | PRN
  Administered 2021-05-06 – 2021-05-08 (×2): 2.5 mg via RESPIRATORY_TRACT
  Filled 2021-05-06 (×2): qty 3

## 2021-05-06 NOTE — Plan of Care (Signed)

## 2021-05-06 NOTE — Progress Notes (Signed)
Progress Note    Thomas Tanner   ZOX:[id number]  DOB: December 28, 1950  DOA: 05/05/2021     1  PCP: Theotis Burrow, NP (Inactive)  CC: constipation, rectal pain, back pain  Hospital Course: Thomas Tanner is a 70 y.o. male with history of CHF, COPD on 3.5 L home oxygen, tobacco use disorder, recent saddle pulmonary embolism in August 2021, CAD, hypertension, chronic right sided low back pain with sciatica, who presented for back, CP, and rectal pain.  Patient reports the primary reason for presentation today was due to chest pains.  He reports that were sharp and very short in duration, started in the morning, located centrally in his chest, not associated with nausea or sweating.  He did not think that he felt like heart attack pains but he wanted to come and be evaluated anyways.  He has also been in significant distress because of constipation and rectal pain.  Reports that starting last week and he is extremely constipated, passed a very large caliber stool which he felt caused trauma.  Subsequently he is continue to be constipated and had a very painful rectum.  On history provided to ED physician he also endorsed a ripping tearing sensation in his abdomen as well.   In the ED initial vital signs notable for tachycardia to the 120s and tachypnea into the 30s as well as a soft-blood pressure of 105/70, this subsequently improved significantly with pulse in the low 100s, normal respiratory rate, and very mild hypertension.  Initial labs showed BMP at baseline, LFTs with mild elevation of total bili at 1.5, CBC with white count elevation of 14.  Initial troponin borderline positive at 18 and was flat at 20 two hours later.  Initial lactic acid was elevated at 5.7, repeat 2-1/2 hours later was above assay of 11.  Initial INR was 1.5, PTT normal.  COVID test was negative.  UA was not suspicious for infection.  EKG showed sinus tachycardia but was largely unchanged from prior.    Chest x-ray showed stable cardiomegaly and pulmonary vascular congestion.  Due to significant cardiovascular disease and sensation of chest/abdominal tearing, a CT chest abdomen and pelvis was obtained which showed diffuse atherosclerosis but no evidence of dissection, resolution of bilateral pulmonary emboli, and no acute intra-abdominal or pelvic findings.   He was treated with nebulizers, methylprednisone, 2L LR bolus, and broad-spectrum antibiotics.  Given overall presentation as well as lactic acidosis he was admitted for further work-up and management.  Interval History:  Resting in bed comfortably when seen this morning.  He endorsed that he was overall weak and deconditioned recently and has not been mobilizing as well.  He was interested in PT evaluation to see if he needed rehab which he would go to.  ROS: Constitutional: negative, Respiratory: positive for cough and dyspnea on exertion, Cardiovascular: negative for chest pain and Gastrointestinal: negative for abdominal pain  Assessment & Plan:  Chest pain - resolved  COPD, possible exacerbation Patient's chest pain history not suggestive of cardiac ischemia.  EKG is unchanged from prior without any ischemic changes.  Troponin is flat and likely demand related -Complete 5-day course of azithromycin and prednisone - Continue chronic oxygen, currently on home setting of 3.5 L  Constipation Rectal pain Patient has been straining and had large bowel movement.  Rectal pain likely from ongoing stool burden - Continue laxative regimen - Continue Preparation H as needed  Physical deconditioning - Patient less ambulatory than usual and endorses more difficulty  with ambulating recently.  We discussed having PT evaluate and if requiring rehab, he is interested otherwise plan would be to discharge home - Follow-up PT eval  Lactic acidosis - resolved  Initial elevations appeared to be possibly reactive; low suspicion for infection or  severe hypoperfusion  Bilateral renal masses Review of prior imaging shows that bilateral masses were present in 2014 and have both grown approximately 5 cm larger since then. - outpatient follow-up with urology.  History of PE-continue apixaban GERD-continue famotidine Chronic dCHF-continue Lasix, metoprolol; no s/s exacerbation  Hypertension- continue lisinopril Hyperlipidemia-continue rosuvastatin  Old records reviewed in assessment of this patient  Antimicrobials: Azithro 5/27 >> current   DVT prophylaxis:  apixaban (ELIQUIS) tablet 5 mg   Code Status:   Code Status: Full Code Family Communication:   Disposition Plan: Status is: Inpatient  Remains inpatient appropriate because:Inpatient level of care appropriate due to severity of illness   Dispo: The patient is from: Home              Anticipated d/c is to: pending PT eval              Patient currently is medically stable to d/c if cleared by PT   Difficult to place patient No  Risk of unplanned readmission score: Unplanned Admission- Pilot do not use: 20.64   Objective: Blood pressure (!) 100/54, pulse 88, temperature 97.8 F (36.6 C), temperature source Oral, resp. rate 18, height 6\' 1"  (1.854 m), weight (!) 190.5 kg, SpO2 98 %.  Examination: General appearance: alert, cooperative, no distress and morbidly obese Head: Normocephalic, without obvious abnormality, atraumatic Eyes: EOMI Lungs: coarse sounds bilaterally Heart: regular rate and rhythm and S1, S2 normal Abdomen: normal findings: bowel sounds normal, soft, non-tender and obese Extremities: no edema Skin: xerosis Neurologic: Grossly normal  Consultants:     Procedures:     Data Reviewed: I have personally reviewed following labs and imaging studies Results for orders placed or performed during the hospital encounter of 05/05/21 (from the past 24 hour(s))  Lactic acid, plasma     Status: None   Collection Time: 05/05/21  2:31 PM  Result  Value Ref Range   Lactic Acid, Venous 0.9 0.5 - 1.9 mmol/L  Troponin I (High Sensitivity)     Status: Abnormal   Collection Time: 05/05/21  4:13 PM  Result Value Ref Range   Troponin I (High Sensitivity) 24 (H) <18 ng/L  Comprehensive metabolic panel     Status: Abnormal   Collection Time: 05/06/21  4:51 AM  Result Value Ref Range   Sodium 133 (L) 135 - 145 mmol/L   Potassium 4.7 3.5 - 5.1 mmol/L   Chloride 97 (L) 98 - 111 mmol/L   CO2 27 22 - 32 mmol/L   Glucose, Bld 112 (H) 70 - 99 mg/dL   BUN 20 8 - 23 mg/dL   Creatinine, Ser 9.141.13 0.61 - 1.24 mg/dL   Calcium 8.7 (L) 8.9 - 10.3 mg/dL   Total Protein 6.9 6.5 - 8.1 g/dL   Albumin 3.3 (L) 3.5 - 5.0 g/dL   AST 15 15 - 41 U/L   ALT 13 0 - 44 U/L   Alkaline Phosphatase 62 38 - 126 U/L   Total Bilirubin 0.9 0.3 - 1.2 mg/dL   GFR, Estimated >78>60 >29>60 mL/min   Anion gap 9 5 - 15  CBC     Status: Abnormal   Collection Time: 05/06/21  4:51 AM  Result Value Ref Range  WBC 24.6 (H) 4.0 - 10.5 K/uL   RBC 4.55 4.22 - 5.81 MIL/uL   Hemoglobin 14.0 13.0 - 17.0 g/dL   HCT 18.5 63.1 - 49.7 %   MCV 94.3 80.0 - 100.0 fL   MCH 30.8 26.0 - 34.0 pg   MCHC 32.6 30.0 - 36.0 g/dL   RDW 02.6 37.8 - 58.8 %   Platelets 218 150 - 400 K/uL   nRBC 0.0 0.0 - 0.2 %    Recent Results (from the past 240 hour(s))  Resp Panel by RT-PCR (Flu A&B, Covid) Nasopharyngeal Swab     Status: None   Collection Time: 05/05/21  9:32 AM   Specimen: Nasopharyngeal Swab; Nasopharyngeal(NP) swabs in vial transport medium  Result Value Ref Range Status   SARS Coronavirus 2 by RT PCR NEGATIVE NEGATIVE Final    Comment: (NOTE) SARS-CoV-2 target nucleic acids are NOT DETECTED.  The SARS-CoV-2 RNA is generally detectable in upper respiratory specimens during the acute phase of infection. The lowest concentration of SARS-CoV-2 viral copies this assay can detect is 138 copies/mL. A negative result does not preclude SARS-Cov-2 infection and should not be used as the sole  basis for treatment or other patient management decisions. A negative result may occur with  improper specimen collection/handling, submission of specimen other than nasopharyngeal swab, presence of viral mutation(s) within the areas targeted by this assay, and inadequate number of viral copies(<138 copies/mL). A negative result must be combined with clinical observations, patient history, and epidemiological information. The expected result is Negative.  Fact Sheet for Patients:  BloggerCourse.com  Fact Sheet for Healthcare Providers:  SeriousBroker.it  This test is no t yet approved or cleared by the [place] FDA and  has been authorized for detection and/or diagnosis of SARS-CoV-2 by FDA under an Emergency Use Authorization (EUA). This EUA will remain  in effect (meaning this test can be used) for the duration of the COVID-19 declaration under Section 564(b)(1) of the Act, 21 U.S.C.section 360bbb-3(b)(1), unless the authorization is terminated  or revoked sooner.       Influenza A by PCR NEGATIVE NEGATIVE Final   Influenza B by PCR NEGATIVE NEGATIVE Final    Comment: (NOTE) The Xpert Xpress SARS-CoV-2/FLU/RSV plus assay is intended as an aid in the diagnosis of influenza from Nasopharyngeal swab specimens and should not be used as a sole basis for treatment. Nasal washings and aspirates are unacceptable for Xpert Xpress SARS-CoV-2/FLU/RSV testing.  Fact Sheet for Patients: BloggerCourse.com  Fact Sheet for Healthcare Providers: SeriousBroker.it  This test is not yet approved or cleared by the [place] FDA and has been authorized for detection and/or diagnosis of SARS-CoV-2 by FDA under an Emergency Use Authorization (EUA). This EUA will remain in effect (meaning this test can be used) for the duration of the COVID-19 declaration under Section 564(b)(1) of the Act,  21 U.S.C. section 360bbb-3(b)(1), unless the authorization is terminated or revoked.  Performed at [facility], [address] [place], [place] [address]   Blood culture (routine single)     Status: None (Preliminary result)   Collection Time: 05/05/21  9:32 AM   Specimen: BLOOD  Result Value Ref Range Status   Specimen Description BLOOD LEFT ANTECUBITAL  Final   Special Requests   Final    BOTTLES DRAWN AEROBIC AND ANAEROBIC Blood Culture results may not be optimal due to an inadequate volume of blood received in culture bottles   Culture   Final    NO GROWTH <  24 HOURS Performed at [facility], [address] [place], [place] [address]    Report Status PENDING  Incomplete  Culture, blood (single)     Status: None (Preliminary result)   Collection Time: 05/05/21 11:06 AM   Specimen: BLOOD  Result Value Ref Range Status   Specimen Description BLOOD  LEFT FOREARM  Final   Special Requests   Final    BOTTLES DRAWN AEROBIC AND ANAEROBIC Blood Culture results may not be optimal due to an inadequate volume of blood received in culture bottles   Culture   Final    NO GROWTH < 24 HOURS Performed at [facility], [address] [place], [place] [address]    Report Status PENDING  Incomplete     Radiology Studies: DG Chest 2 View  Result Date: 05/05/2021 CLINICAL DATA:  Chest pain. EXAM: CHEST - 2 VIEW COMPARISON:  August 03, 2020. FINDINGS: Stable cardiomegaly with mild central pulmonary vascular congestion. No pneumothorax or pleural effusion is noted. No consolidative process is noted. Bony thorax is unremarkable. IMPRESSION: Stable cardiomegaly with central pulmonary vascular congestion. Electronically Signed   By: Lupita Raider M.D.   On: 05/05/2021 09:32   CT Angio Chest/Abd/Pel for Dissection W and/or Wo Contrast  Result Date: 05/05/2021 CLINICAL DATA:  Acute abdominal pain, concern for dissection, remote history of saddle pulmonary embolus  08/02/2020 EXAM: CT ANGIOGRAPHY CHEST, ABDOMEN AND PELVIS TECHNIQUE: Non-contrast CT of the chest was initially obtained. Multidetector CT imaging through the chest, abdomen and pelvis was performed using the standard protocol during bolus administration of intravenous contrast. Multiplanar reconstructed images and MIPs were obtained and reviewed to evaluate the vascular anatomy. CONTRAST:  OMNIPAQUE IOHEXOL 350 MG/ML SOLN COMPARISON:  08/02/2020, 05/12/2013 FINDINGS: CTA CHEST FINDINGS Cardiovascular: Initial noncontrast imaging demonstrates calcific atherosclerosis of the aorta. No significant hyperdense acute intramural hematoma. Exam is limited with respiratory motion artifact. Native coronary atherosclerosis. Postcontrast, the thoracic aorta appears intact. Negative for acute dissection or aneurysm. No mediastinal hemorrhage or hematoma. Central pulmonary arteries appear patent and normal in caliber. No large central PE. Central veins including the innominate veins and SVC are patent. No veno-occlusive process. Overall normal heart size.  No pericardial effusion. Mediastinum/Nodes: Heterogeneous nodular left inferior thyroid enlargement measuring 3.8 cm. Trachea and central airways are patent. Esophagus nondilated. No hiatal hernia. Negative for adenopathy. Lungs/Pleura: Minor scattered areas of parenchymal scarring. No acute airspace process, collapse or consolidation. Negative for interstitial disease or edema. No pleural abnormality, effusion or pneumothorax. Musculoskeletal: Degenerative changes noted of the spine. Old rib fractures evident. No acute osseous finding. Sternum intact. Review of the MIP images confirms the above findings. CTA ABDOMEN AND PELVIS FINDINGS VASCULAR Aorta: Intact abdominal aorta. Negative for acute dissection or aneurysm. No evidence of rupture or retroperitoneal hematoma. Atherosclerotic changes noted throughout the abdominal aorta. Celiac: Origin remains patent including  its branches centrally. SMA: Origin remains patent including its branches in the central mesentery. Renals: Calcified origins but appear to remain patent. IMA: Calcified but appears to remain patent off the distal aorta. Inflow: Tortuosity and atherosclerosis of the iliac vessels without significant inflow disease or occlusion. Limited assessment because of contrast opacification. Veins: No abdominopelvic veno-occlusive process. Review of the MIP images confirms the above findings. NON-VASCULAR Hepatobiliary: No focal liver abnormality is seen. No gallstones, gallbladder wall thickening, or biliary dilatation. Pancreas: Unremarkable. No pancreatic ductal dilatation or surrounding inflammatory changes. Spleen: Normal in size without focal abnormality. Adrenals/Urinary Tract: Normal adrenal glands for age.  Right kidney demonstrates a exophytic posterior hypodense lesion without definite enhancement measuring 10 cm compatible with a renal cyst. Left kidney also has a heterogeneous posterior mixed density exophytic lesion measuring up to 9.4 cm remaining indeterminate for solid components by single-phase imaging. Recommend further evaluation with nonemergent abdominal MRI without and with contrast. No acute renal obstruction or hydronephrosis. No hydroureter. Bladder unremarkable. Stomach/Bowel: Limited without oral contrast. Negative for bowel obstruction, significant dilatation, ileus, or free air. Normal appearing appendix containing air. No free fluid, fluid collection, hemorrhage, hematoma, abscess or ascites. Lymphatic: No bulky adenopathy. Reproductive: No significant finding by CT. Other: Nonspecific mild presacral strandy edema without clear etiology or mass. Musculoskeletal: Degenerative changes noted of the spine. Advanced right hip degenerative arthropathy with acetabular and femoral head cystic change. No acute osseous finding or fracture. Review of the MIP images confirms the above findings. IMPRESSION:  Thoracoabdominal atherosclerosis without dissection, aneurysm or other acute vascular process. Resolved bilateral pulmonary emboli compared to 08/02/2020. No other acute intrathoracic finding. 9.4 cm exophytic posterolateral left renal mass, indeterminate for cystic and solid components by single-phase imaging. Renal neoplasm not excluded. Recommend further evaluation with nonemergent abdominal MRI without and with contrast. Additional 10 cm right posterior renal cyst. No other acute intra-abdominal or pelvic finding. Electronically Signed   By: Judie Petit.  Shick M.D.   On: 05/05/2021 11:17   CT Angio Chest/Abd/Pel for Dissection W and/or Wo Contrast  Final Result    DG Chest 2 View  Final Result      Scheduled Meds: . apixaban  5 mg Oral BID  . azithromycin  500 mg Oral Daily  . famotidine  20 mg Oral BID  . furosemide  20 mg Oral Daily  . ipratropium-albuterol  3 mL Nebulization TID  . lisinopril  10 mg Oral Daily  . metoprolol succinate  50 mg Oral Daily  . omega-3 acid ethyl esters  1 g Oral Daily  . polyethylene glycol  17 g Oral Daily  . predniSONE  40 mg Oral Q breakfast  . rosuvastatin  20 mg Oral Daily  . senna-docusate  1 tablet Oral BID   PRN Meds: sodium chloride, acetaminophen, albuterol, ondansetron (ZOFRAN) IV, phenylephrine-shark liver oil-mineral oil-petrolatum Continuous Infusions: . sodium chloride Stopped (05/06/21 0025)     LOS: 1 day  Time spent: Greater than 50% of the 35 minute visit was spent in counseling/coordination of care for the patient as laid out in the A&P.   Lewie Chamber, MD [facility] 05/06/2021, 1:42 PM

## 2021-05-06 NOTE — Evaluation (Signed)
Physical Therapy Evaluation Patient Details Name: Thomas Tanner MRN: [id number] DOB: [birth year] Today's Date: 05/06/2021   History of Present Illness  Thomas Tanner is a 70 y.o. male with history of CHF, COPD on 3-1/2 L home oxygen, tobacco use disorder, recent saddle pulmonary embolism in August 2021, CAD, hypertension, chronic right sided low back pain with sciatica, who presents for back and chest pain.  Clinical Impression  Pt presents in good spirits and highly motivated towards goals. He requires increased time for set up this session as bariatric equipment is required for safety. Bari-RW left in patient room and bariatric bedside commode ordered. The pt is tolerates sitting at the EOB x15 this session, but requires use of BUE and forward lean on RW in order to control breathing. He is able to tolerate one sit<>stand transfer with [facility] significantly elevated for safety and completion of task. The pt demonstrates great buttock clearance for lateral scooting at the EOB, however reports increased levels of fatigue following this activity. At this time the pt would best benefit from SNF in order to optimize mobility and independence with ADLs in order to safely return home.     Follow Up Recommendations SNF    Equipment Recommendations       Recommendations for Other Services       Precautions / Restrictions Precautions Precautions: Fall      Mobility  Bed Mobility Overal bed mobility: Needs Assistance Bed Mobility: Supine to Sit;Sit to Supine     Supine to sit: HOB elevated;Supervision Sit to supine: Mod assist   General bed mobility comments: HOB elevated, heavy reliance on bed railings to achieve EOB.    Transfers Overall transfer level: Needs assistance Equipment used: Rolling walker (2 wheeled) Transfers: Sit to/from Stand Sit to Stand: Min assist;From elevated surface         General transfer comment: Pt able to verbal how to set up environment  for sit<>stand transfer. Pt requires Min A for steading and needed blocking of RW for safety.  Ambulation/Gait             General Gait Details: Pt unable to safely ambulate this session.  Stairs            Wheelchair Mobility    Modified Rankin (Stroke Patients Only)       Balance Overall balance assessment: Needs assistance Sitting-balance support: Feet supported;Bilateral upper extremity supported Sitting balance-Leahy Scale: Fair     Standing balance support: Bilateral upper extremity supported Standing balance-Leahy Scale: Poor                               Pertinent Vitals/Pain Pain Assessment: No/denies pain (No pain currently reports rectal pain when sitting EOB.)    Home Living Family/patient expects to be discharged to:: Private residence Living Arrangements: Spouse/significant other Available Help at Discharge: Family;Available 24 hours/day Type of Home: House Home Access: Stairs to enter Entrance Stairs-Rails: Can reach both Entrance Stairs-Number of Steps: 6 Home Layout: One level Home Equipment: Walker - 4 wheels;Shower seat;Grab bars - tub/shower;Bedside commode;Cane - single point      Prior Function Level of Independence: Needs assistance   Gait / Transfers Assistance Needed: Pt reports using lift chair for sit<>stand transfer. Reports only using RW if needed.  ADL's / Homemaking Assistance Needed: Pt requires assistance for bathing but is independent with dressing.        Hand Dominance   Dominant Hand:  Right    Extremity/Trunk Assessment   Upper Extremity Assessment Upper Extremity Assessment: Overall WFL for tasks assessed    Lower Extremity Assessment Lower Extremity Assessment: Generalized weakness       Communication   Communication: No difficulties  Cognition Arousal/Alertness: Awake/alert Behavior During Therapy: WFL for tasks assessed/performed Overall Cognitive Status: Within Functional Limits for  tasks assessed                                        General Comments      Exercises Other Exercises Other Exercises: Pt able to participate in lateral scooting towards the [facility] in order to optimize positioning prior to return to supine.   Assessment/Plan    PT Assessment Patient needs continued PT services  PT Problem List Decreased strength;Decreased mobility;Decreased activity tolerance;Decreased balance       PT Treatment Interventions Therapeutic activities;Gait training;Therapeutic exercise;Functional mobility training;Balance training;Stair training;Patient/family education    PT Goals (Current goals can be found in the Care Plan section)  Acute Rehab PT Goals Patient Stated Goal: to get back to walking PT Goal Formulation: With patient Time For Goal Achievement: 05/20/21 Potential to Achieve Goals: Fair    Frequency Min 2X/week   Barriers to discharge        Co-evaluation               AM-PAC PT "6 Clicks" Mobility  Outcome Measure Help needed turning from your back to your side while in a flat bed without using bedrails?: A Lot Help needed moving from lying on your back to sitting on the side of a flat bed without using bedrails?: A Lot Help needed moving to and from a bed to a chair (including a wheelchair)?: A Lot Help needed standing up from a chair using your arms (e.g., wheelchair or bedside chair)?: A Little Help needed to walk in hospital room?: A Lot Help needed climbing 3-5 steps with a railing? : Total 6 Click Score: 12    End of Session Equipment Utilized During Treatment: Oxygen Activity Tolerance: Patient tolerated treatment well;Patient limited by fatigue Patient left: in bed;with bed alarm set;with call bell/phone within reach Nurse Communication: Mobility status PT Visit Diagnosis: Unsteadiness on feet (R26.81);Muscle weakness (generalized) (M62.81);Difficulty in walking, not elsewhere classified (R26.2)    Time:  9476-5465 PT Time Calculation (min) (ACUTE ONLY): 69 min   Charges:   PT Evaluation $PT Eval Moderate Complexity: 1 Mod PT Treatments $Therapeutic Activity: 53-67 mins        4:36 PM, 05/06/21 Adisen Bennion A. Mordecai Maes PT, DPT Physical Therapist - [facility] A Jovonda Selner 05/06/2021, 4:33 PM

## 2021-05-06 NOTE — Consult Note (Signed)
PHARMACY - PHYSICIAN COMMUNICATION CRITICAL VALUE ALERT - BLOOD CULTURE IDENTIFICATION (BCID)  Thomas Tanner is an 69 y.o. male who presented to [facility] on 05/05/2021  Assessment:  Gram statin showed GNR but BCID was negative (include suspected source if known)  Name of physician (or Provider) Contacted: [place]  Current antibiotics: azithromycin  Changes to prescribed antibiotics recommended:  Patient is on recommended antibiotics - No changes needed  Results for orders placed or performed during the hospital encounter of 05/05/21  Blood Culture ID Panel (Reflexed) (Collected: 05/05/2021  9:32 AM)  Result Value Ref Range   Enterococcus faecalis NOT DETECTED NOT DETECTED   Enterococcus Faecium NOT DETECTED NOT DETECTED   Listeria monocytogenes NOT DETECTED NOT DETECTED   Staphylococcus species NOT DETECTED NOT DETECTED   Staphylococcus aureus (BCID) NOT DETECTED NOT DETECTED   Staphylococcus epidermidis NOT DETECTED NOT DETECTED   Staphylococcus lugdunensis NOT DETECTED NOT DETECTED   Streptococcus species NOT DETECTED NOT DETECTED   Streptococcus agalactiae NOT DETECTED NOT DETECTED   Streptococcus pneumoniae NOT DETECTED NOT DETECTED   Streptococcus pyogenes NOT DETECTED NOT DETECTED   A.calcoaceticus-baumannii NOT DETECTED NOT DETECTED   Bacteroides fragilis NOT DETECTED NOT DETECTED   Enterobacterales NOT DETECTED NOT DETECTED   Enterobacter cloacae complex NOT DETECTED NOT DETECTED   Escherichia coli NOT DETECTED NOT DETECTED   Klebsiella aerogenes NOT DETECTED NOT DETECTED   Klebsiella oxytoca NOT DETECTED NOT DETECTED   Klebsiella pneumoniae NOT DETECTED NOT DETECTED   Proteus species NOT DETECTED NOT DETECTED   Salmonella species NOT DETECTED NOT DETECTED   Serratia marcescens NOT DETECTED NOT DETECTED   Haemophilus influenzae NOT DETECTED NOT DETECTED   Neisseria meningitidis NOT DETECTED NOT DETECTED   Pseudomonas aeruginosa NOT DETECTED NOT DETECTED    Stenotrophomonas maltophilia NOT DETECTED NOT DETECTED   Candida albicans NOT DETECTED NOT DETECTED   Candida auris NOT DETECTED NOT DETECTED   Candida glabrata NOT DETECTED NOT DETECTED   Candida krusei NOT DETECTED NOT DETECTED   Candida parapsilosis NOT DETECTED NOT DETECTED   Candida tropicalis NOT DETECTED NOT DETECTED   Cryptococcus neoformans/gattii NOT DETECTED NOT DETECTED    [name] 05/06/2021  7:29 PM

## 2021-05-07 DIAGNOSIS — R7881 Bacteremia: Principal | ICD-10-CM

## 2021-05-07 MED ORDER — MOMETASONE FURO-FORMOTEROL FUM 200-5 MCG/ACT IN AERO
2.0000 | INHALATION_SPRAY | Freq: Two times a day (BID) | RESPIRATORY_TRACT | Status: DC
  Administered 2021-05-07 – 2021-05-16 (×18): 2 via RESPIRATORY_TRACT
  Filled 2021-05-07: qty 8.8

## 2021-05-07 MED ORDER — SODIUM CHLORIDE 0.9 % IV SOLN
2.0000 g | INTRAVENOUS | Status: DC
  Administered 2021-05-07 – 2021-05-09 (×3): 2 g via INTRAVENOUS
  Filled 2021-05-07: qty 2
  Filled 2021-05-07: qty 20
  Filled 2021-05-07: qty 2

## 2021-05-07 MED ORDER — TIOTROPIUM BROMIDE MONOHYDRATE 18 MCG IN CAPS
18.0000 ug | ORAL_CAPSULE | Freq: Every day | RESPIRATORY_TRACT | Status: DC
  Administered 2021-05-07 – 2021-05-16 (×10): 18 ug via RESPIRATORY_TRACT
  Filled 2021-05-07 (×2): qty 5

## 2021-05-07 NOTE — TOC Initial Note (Signed)
Transition of Care [facility]) - Initial/Assessment Note    Patient Details  Name: Thomas Tanner MRN: [id number] Date of Birth: [birth year]  Transition [facility] CM/SW Contact:    Candie Chroman, LCSW Phone Number: 05/07/2021, 11:15 AM  Clinical Narrative:   CSW met with patient. No supports at bedside. CSW introduced role and explained that PT recommendations would be discussed. Patient is agreeable to SNF placement. No facility preference. Provided CMS scores for facilities within 25 miles of his zip code. Explained how insurance covers. Patient is interested in applying for Medicaid. Told him he would have to reach out to DSS or have social worker at [facility] assist. Patient does not have COVID vaccines. No further concerns. CSW encouraged patient to contact CSW as needed. CSW will continue to follow patient for support and facilitate discharge to SNF once medically stable.               Expected Discharge Plan: Skilled Nursing Facility Barriers to Discharge: Continued Medical Work up   Patient Goals and CMS Choice     Choice offered to / list presented to : Patient  Expected Discharge Plan and Services Expected Discharge Plan: [place] Acute Care Choice: [place] Living arrangements for the past 2 months: Single Family Home                                      Prior Living Arrangements/Services Living arrangements for the past 2 months: Single Family Home Lives with:: Spouse Patient language and need for interpreter reviewed:: Yes Do you feel safe going back to the place where you live?: Yes      Need for Family Participation in Patient Care: Yes (Comment) Care giver support system in place?: Yes (comment)   Criminal Activity/Legal Involvement Pertinent to Current Situation/Hospitalization: No - Comment as needed  Activities of Daily Living Home Assistive Devices/Equipment: Gilford Rile (specify type) ADL Screening  (condition at time of admission) Patient's cognitive ability adequate to safely complete daily activities?: Yes Is the patient deaf or have difficulty hearing?: No Does the patient have difficulty seeing, even when wearing glasses/contacts?: No Does the patient have difficulty concentrating, remembering, or making decisions?: No Patient able to express need for assistance with ADLs?: Yes Does the patient have difficulty dressing or bathing?: Yes Independently performs ADLs?: No Communication: Independent Dressing (OT): Needs assistance Is this a change from baseline?: Pre-admission baseline Feeding: Needs assistance Is this a change from baseline?: Pre-admission baseline Toileting: Needs assistance Is this a change from baseline?: Pre-admission baseline Walks in Home: Needs assistance Is this a change from baseline?: Pre-admission baseline Does the patient have difficulty walking or climbing stairs?: Yes Weakness of Legs: Both Weakness of Arms/Hands: None  Permission Sought/Granted Permission sought to share information with : Facility Art therapist granted to share information with : Yes, Verbal Permission Granted     Permission granted to share info w AGENCY: SNF's        Emotional Assessment Appearance:: Appears stated age Attitude/Demeanor/Rapport: Engaged,Gracious Affect (typically observed): Accepting,Appropriate,Calm,Pleasant Orientation: : Oriented to Self,Oriented to Place,Oriented to  Time,Oriented to Situation Alcohol / Substance Use: Not Applicable Psych Involvement: No (comment)  Admission diagnosis:  Lactic acidosis [E87.2] Lactic acid acidosis [E87.2] COPD exacerbation (HCC) [J44.1] Acute low back pain, unspecified back pain laterality, unspecified whether sciatica present [M54.50] Sepsis, due to unspecified organism, unspecified whether  acute organ dysfunction present [facility]) [A41.9] Patient Active Problem List   Diagnosis Date Noted  .  Constipated 05/06/2021  . Physical deconditioning 05/06/2021  . Atherosclerosis of aorta ([place]) 09/28/2020  . Acute on chronic heart failure ([place]) 08/02/2020  . Acute saddle pulmonary embolism ([place]) 08/02/2020  . Hypoxia 08/01/2020  . Weakness 08/01/2020  . Abnormality of gait and mobility 08/01/2020  . AKI (acute kidney injury) ([place]) 07/21/2020  . Hypotension 07/20/2020  . Leukocytosis 07/20/2020  . Cellulitis of right lower extremity 07/20/2020  . Pressure injury of skin 07/12/2020  . Acute respiratory failure with hypoxia ([place]) 07/10/2020  . COPD with acute exacerbation ([place])   . Elevated troponin   . Chronic diastolic CHF (congestive heart failure) ([place]) 05/24/2020  . Anasarca   . Acute renal failure superimposed on stage 3a chronic kidney disease ([place])   . Hyperlipidemia   . Tobacco abuse   . Shortness of breath 04/28/2020  . Upper respiratory tract infection due to COVID-19 virus 04/28/2020  . Edema 04/28/2020  . Encounter for general adult medical examination with abnormal findings 01/15/2019  . Acute non-recurrent maxillary sinusitis 01/15/2019  . Gastroesophageal reflux disease without esophagitis 01/15/2019  . CAD in native artery 12/16/2018  . Morbid obesity with BMI of 50.0-59.9, adult ([place]) 12/16/2018  . Cigarette smoker motivated to quit 12/16/2018  . Chronic pain of right knee 08/04/2018  . Mild intermittent asthma 08/04/2018  . Impaired fasting glucose 04/03/2018  . Low back pain with right-sided sciatica 03/05/2018  . Chronic right-sided low back pain with right-sided sciatica 03/05/2018  . Pain in right hip 03/05/2018  . Hypertension 03/05/2018  . Foreign body in bladder and urethra 06/09/2013  . Kidney stone 06/09/2013  . Ureteric stone 06/09/2013  . Kidney stone 06/09/2013   PCP:  Luiz Ochoa, NP (Inactive) Pharmacy:   [facility] [address]), [place]) [place]  [address] Phone: [phone] Fax: [place], [place], [address] [place], [place] [address] [place] [address] Phone: [phone] Fax: [phone]     Social Determinants of Health (SDOH) Interventions    Readmission Risk Interventions No flowsheet data found.

## 2021-05-07 NOTE — Progress Notes (Signed)
Progress Note    Thomas Tanner   LJQ:[id number]  DOB: 1951/11/22  DOA: 05/05/2021     2  PCP: Theotis Burrow, NP (Inactive)  CC: constipation, rectal pain, back pain  Hospital Course: [name] is a 70 y.o. male with history of CHF, COPD on 3.5 L home oxygen, tobacco use disorder, recent saddle pulmonary embolism in August 2021, CAD, hypertension, chronic right sided low back pain with sciatica, who presented for back, CP, and rectal pain.  Patient reports the primary reason for presentation today was due to chest pains.  He reports that were sharp and very short in duration, started in the morning, located centrally in his chest, not associated with nausea or sweating.  He did not think that he felt like heart attack pains but he wanted to come and be evaluated anyways.  He has also been in significant distress because of constipation and rectal pain.  Reports that starting last week and he is extremely constipated, passed a very large caliber stool which he felt caused trauma.  Subsequently he is continue to be constipated and had a very painful rectum.  On history provided to ED physician he also endorsed a ripping tearing sensation in his abdomen as well.   In the ED initial vital signs notable for tachycardia to the 120s and tachypnea into the 30s as well as a soft-blood pressure of 105/70, this subsequently improved significantly with pulse in the low 100s, normal respiratory rate, and very mild hypertension.  Initial labs showed BMP at baseline, LFTs with mild elevation of total bili at 1.5, CBC with white count elevation of 14.  Initial troponin borderline positive at 18 and was flat at 20 two hours later.  Initial lactic acid was elevated at 5.7, repeat 2-1/2 hours later was above assay of 11.  Initial INR was 1.5, PTT normal.  COVID test was negative.  UA was not suspicious for infection.  EKG showed sinus tachycardia but was largely unchanged from prior.    Chest x-ray showed stable cardiomegaly and pulmonary vascular congestion.  Due to significant cardiovascular disease and sensation of chest/abdominal tearing, a CT chest abdomen and pelvis was obtained which showed diffuse atherosclerosis but no evidence of dissection, resolution of bilateral pulmonary emboli, and no acute intra-abdominal or pelvic findings.   He was treated with nebulizers, methylprednisone, 2L LR bolus, and broad-spectrum antibiotics.  Given overall presentation as well as lactic acidosis he was admitted for further work-up and management.  Interval History:  No events overnight.  States that his constipation has continued to improve.  Breathing feels comfortable as well. Denies fevers, chills, weakness.  PT recommended SNF and he is amenable. Discussed with him regarding blood culture results showing 1/4 bottles positive with GNR.  Started him on an antibiotic this morning when I was chart reviewing and saw the positive culture results.  I had not been informed prior nor night coverage last night.  ROS: Constitutional: negative, Respiratory: positive for cough and dyspnea on exertion, Cardiovascular: negative for chest pain and Gastrointestinal: negative for abdominal pain  Assessment & Plan:  Gram-negative bacteremia -Possible translocation from his underlying severe constipation/stool burden.  Admission blood cultures showing 1/4 bottles positive with GNR.  No speciation yet.  Results seen on chart review this morning, 05/07/2021.  Safety zone placed as night coverage last night not informed nor myself - started Rocephin 05/07/21 in the morning - follow up cultures for speciation  Chest pain - resolved  COPD, possible  exacerbation Patient's chest pain history not suggestive of cardiac ischemia.  EKG is unchanged from prior without any ischemic changes.  Troponin is flat and likely demand related -Complete 5-day course of azithromycin and prednisone - Continue chronic  oxygen, currently on home setting of 3.5 L -Start on [place] - Outpatient referral to pulmonology  Constipation Rectal pain Patient has been straining and had large bowel movement.  Rectal pain likely from ongoing stool burden - Continue laxative regimen - Continue Preparation H as needed  Physical deconditioning - Patient less ambulatory than usual and endorses more difficulty with ambulating recently.  We discussed having PT evaluate and if requiring rehab, he is interested otherwise plan would be to discharge home -- SNF recommended; await offers  Lactic acidosis - resolved  Initial elevations appeared to be possibly reactive; low suspicion for infection or severe hypoperfusion  Bilateral renal masses Review of prior imaging shows that bilateral masses were present in 2014 and have both grown approximately 5 cm larger since then. - outpatient follow-up with urology.  History of PE-continue apixaban GERD-continue famotidine Chronic dCHF-continue Lasix, metoprolol; no s/s exacerbation  Hypertension- continue lisinopril Hyperlipidemia-continue rosuvastatin  Old records reviewed in assessment of this patient  Antimicrobials: Azithro 5/27 >> current  Rocephin 5/29 >> current   DVT prophylaxis:  apixaban (ELIQUIS) tablet 5 mg   Code Status:   Code Status: Full Code Family Communication:   Disposition Plan: Status is: Inpatient  Remains inpatient appropriate because:Inpatient level of care appropriate due to severity of illness   Dispo: The patient is from: Home              Anticipated d/c is to: SNF              Patient currently is not medically stable to d/c   Difficult to place patient No  Risk of unplanned readmission score: Unplanned Admission- Pilot do not use: 21.14   Objective: Blood pressure (!) 107/47, pulse 82, temperature 98.4 F (36.9 C), temperature source Oral, resp. rate 20, height 6\' 1"  (1.854 m), weight (!) 190.5 kg, SpO2 91 %.   Examination: General appearance: alert, cooperative, no distress and morbidly obese Head: Normocephalic, without obvious abnormality, atraumatic Eyes: EOMI Lungs: coarse sounds bilaterally Heart: regular rate and rhythm and S1, S2 normal Abdomen: normal findings: bowel sounds normal, soft, non-tender and obese Extremities: obese, chronic edema LE noted Skin: xerosis Neurologic: Grossly normal  Consultants:     Procedures:     Data Reviewed: I have personally reviewed following labs and imaging studies No results found for this or any previous visit (from the past 24 hour(s)).  Recent Results (from the past 240 hour(s))  Resp Panel by RT-PCR (Flu A&B, Covid) Nasopharyngeal Swab     Status: None   Collection Time: 05/05/21  9:32 AM   Specimen: Nasopharyngeal Swab; Nasopharyngeal(NP) swabs in vial transport medium  Result Value Ref Range Status   SARS Coronavirus 2 by RT PCR NEGATIVE NEGATIVE Final    Comment: (NOTE) SARS-CoV-2 target nucleic acids are NOT DETECTED.  The SARS-CoV-2 RNA is generally detectable in upper respiratory specimens during the acute phase of infection. The lowest concentration of SARS-CoV-2 viral copies this assay can detect is 138 copies/mL. A negative result does not preclude SARS-Cov-2 infection and should not be used as the sole basis for treatment or other patient management decisions. A negative result may occur with  improper specimen collection/handling, submission of specimen other than nasopharyngeal swab, presence of viral mutation(s)  within the areas targeted by this assay, and inadequate number of viral copies(<138 copies/mL). A negative result must be combined with clinical observations, patient history, and epidemiological information. The expected result is Negative.  Fact Sheet for Patients:  BloggerCourse.com  Fact Sheet for Healthcare Providers:  SeriousBroker.it  This test is  no t yet approved or cleared by the [place] FDA and  has been authorized for detection and/or diagnosis of SARS-CoV-2 by FDA under an Emergency Use Authorization (EUA). This EUA will remain  in effect (meaning this test can be used) for the duration of the COVID-19 declaration under Section 564(b)(1) of the Act, 21 U.S.C.section 360bbb-3(b)(1), unless the authorization is terminated  or revoked sooner.       Influenza A by PCR NEGATIVE NEGATIVE Final   Influenza B by PCR NEGATIVE NEGATIVE Final    Comment: (NOTE) The Xpert Xpress SARS-CoV-2/FLU/RSV plus assay is intended as an aid in the diagnosis of influenza from Nasopharyngeal swab specimens and should not be used as a sole basis for treatment. Nasal washings and aspirates are unacceptable for Xpert Xpress SARS-CoV-2/FLU/RSV testing.  Fact Sheet for Patients: BloggerCourse.com  Fact Sheet for Healthcare Providers: SeriousBroker.it  This test is not yet approved or cleared by the [place] FDA and has been authorized for detection and/or diagnosis of SARS-CoV-2 by FDA under an Emergency Use Authorization (EUA). This EUA will remain in effect (meaning this test can be used) for the duration of the COVID-19 declaration under Section 564(b)(1) of the Act, 21 U.S.C. section 360bbb-3(b)(1), unless the authorization is terminated or revoked.  Performed at [facility], [address] [place], [place] [address]   Blood culture (routine single)     Status: None (Preliminary result)   Collection Time: 05/05/21  9:32 AM   Specimen: BLOOD  Result Value Ref Range Status   Specimen Description BLOOD LEFT ANTECUBITAL  Final   Special Requests   Final    BOTTLES DRAWN AEROBIC AND ANAEROBIC Blood Culture results may not be optimal due to an inadequate volume of blood received in culture bottles   Culture  Setup Time   Final    Organism ID to follow GRAM NEGATIVE  RODS ANAEROBIC BOTTLE ONLY CRITICAL RESULT CALLED TO, READ BACK BY AND VERIFIED WITH: SUSAN WATSON ON 05/06/21 AT 1918 QSD Performed at [facility], [address] [place], [place] [address]    Culture GRAM NEGATIVE RODS  Final   Report Status PENDING  Incomplete  Urine culture     Status: Abnormal (Preliminary result)   Collection Time: 05/05/21  9:32 AM   Specimen: In/Out Cath Urine  Result Value Ref Range Status   Specimen Description   Final    IN/OUT CATH URINE Performed at [facility], [address], [place], [place] [address]    Special Requests   Final    NONE Performed at [facility], [address], [place], [place] [address]    Culture (A)  Final    80,000 COLONIES/mL STAPHYLOCOCCUS EPIDERMIDIS CULTURE REINCUBATED FOR BETTER GROWTH Performed at [facility], [address], [place], [place] [address]    Report Status PENDING  Incomplete  Blood Culture ID Panel (Reflexed)     Status: None   Collection Time: 05/05/21  9:32 AM  Result Value Ref Range Status   Enterococcus faecalis NOT DETECTED NOT DETECTED Final   Enterococcus Faecium NOT DETECTED NOT DETECTED Final   Listeria monocytogenes NOT DETECTED NOT DETECTED Final  Staphylococcus species NOT DETECTED NOT DETECTED Final   Staphylococcus aureus (BCID) NOT DETECTED NOT DETECTED Final   Staphylococcus epidermidis NOT DETECTED NOT DETECTED Final   Staphylococcus lugdunensis NOT DETECTED NOT DETECTED Final   Streptococcus species NOT DETECTED NOT DETECTED Final   Streptococcus agalactiae NOT DETECTED NOT DETECTED Final   Streptococcus pneumoniae NOT DETECTED NOT DETECTED Final   Streptococcus pyogenes NOT DETECTED NOT DETECTED Final   A.calcoaceticus-baumannii NOT DETECTED NOT DETECTED Final   Bacteroides fragilis NOT DETECTED NOT DETECTED Final   Enterobacterales NOT DETECTED NOT DETECTED Final   Enterobacter cloacae complex NOT DETECTED NOT DETECTED Final   Escherichia  coli NOT DETECTED NOT DETECTED Final   Klebsiella aerogenes NOT DETECTED NOT DETECTED Final   Klebsiella oxytoca NOT DETECTED NOT DETECTED Final   Klebsiella pneumoniae NOT DETECTED NOT DETECTED Final   Proteus species NOT DETECTED NOT DETECTED Final   Salmonella species NOT DETECTED NOT DETECTED Final   Serratia marcescens NOT DETECTED NOT DETECTED Final   Haemophilus influenzae NOT DETECTED NOT DETECTED Final   Neisseria meningitidis NOT DETECTED NOT DETECTED Final   Pseudomonas aeruginosa NOT DETECTED NOT DETECTED Final   Stenotrophomonas maltophilia NOT DETECTED NOT DETECTED Final   Candida albicans NOT DETECTED NOT DETECTED Final   Candida auris NOT DETECTED NOT DETECTED Final   Candida glabrata NOT DETECTED NOT DETECTED Final   Candida krusei NOT DETECTED NOT DETECTED Final   Candida parapsilosis NOT DETECTED NOT DETECTED Final   Candida tropicalis NOT DETECTED NOT DETECTED Final   Cryptococcus neoformans/gattii NOT DETECTED NOT DETECTED Final    Comment: Performed at [facility], [address] [place], [place] [address]  Culture, blood (single)     Status: None (Preliminary result)   Collection Time: 05/05/21 11:06 AM   Specimen: BLOOD  Result Value Ref Range Status   Specimen Description BLOOD  LEFT FOREARM  Final   Special Requests   Final    BOTTLES DRAWN AEROBIC AND ANAEROBIC Blood Culture results may not be optimal due to an inadequate volume of blood received in culture bottles   Culture   Final    NO GROWTH 2 DAYS Performed at [facility], [address] [place], [place] [address]    Report Status PENDING  Incomplete  Expectorated Sputum Assessment w Gram Stain, Rflx to Resp Cult     Status: None   Collection Time: 05/06/21 12:53 PM   Specimen: Sputum  Result Value Ref Range Status   Specimen Description SPUTUM  Final   Special Requests NONE  Final   Sputum evaluation   Final    Sputum specimen not acceptable for testing.  Please recollect.    CALLED TO ZOIA CISNEROS  ON 05/06/21 SKL Performed at [facility], [address], [place], [place] [address]    Report Status 05/06/2021 FINAL  Final     Radiology Studies: No results found. CT Angio Chest/Abd/Pel for Dissection W and/or Wo Contrast  Final Result    DG Chest 2 View  Final Result      Scheduled Meds: . apixaban  5 mg Oral BID  . azithromycin  500 mg Oral Daily  . famotidine  20 mg Oral BID  . furosemide  20 mg Oral Daily  . lisinopril  10 mg Oral Daily  . metoprolol succinate  50 mg Oral Daily  . mometasone-formoterol  2 puff Inhalation BID  . omega-3 acid ethyl esters  1 g Oral Daily  . polyethylene glycol  17  g Oral Daily  . predniSONE  40 mg Oral Q breakfast  . rosuvastatin  20 mg Oral Daily  . senna-docusate  1 tablet Oral BID  . tiotropium  18 mcg Inhalation Daily   PRN Meds: sodium chloride, acetaminophen, albuterol, ondansetron (ZOFRAN) IV, phenylephrine-shark liver oil-mineral oil-petrolatum Continuous Infusions: . sodium chloride Stopped (05/06/21 0025)  . cefTRIAXone (ROCEPHIN)  IV Stopped (05/07/21 1138)     LOS: 2 days  Time spent: Greater than 50% of the 35 minute visit was spent in counseling/coordination of care for the patient as laid out in the A&P.   Lewie Chamberavid Gretchen Weinfeld, MD [facility] 05/07/2021, 2:29 PM

## 2021-05-07 NOTE — [place] FL2 (Signed)
[place] MEDICAID FL2 LEVEL OF CARE SCREENING TOOL     IDENTIFICATION  Patient Name: Thomas Tanner Birthdate: [birth year] Sex: male Admission Date (Current Location): 05/05/2021  [place] and [place] Number:  Chiropodist and Address:  [facility], [address] [place], [place] [address]      Provider Number: [id number]  Attending Physician Name and Address:  Lewie Chamber, MD  Relative Name and Phone Number:       Current Level of Care: Hospital Recommended Level of Care: Skilled Nursing Facility Prior Approval Number:    Date Approved/Denied:   PASRR Number: [id number]  Discharge Plan: SNF    Current Diagnoses: Patient Active Problem List   Diagnosis Date Noted  . Constipated 05/06/2021  . Physical deconditioning 05/06/2021  . Atherosclerosis of aorta (HCC) 09/28/2020  . Acute on chronic heart failure (HCC) 08/02/2020  . Acute saddle pulmonary embolism (HCC) 08/02/2020  . Hypoxia 08/01/2020  . Weakness 08/01/2020  . Abnormality of gait and mobility 08/01/2020  . AKI (acute kidney injury) (HCC) 07/21/2020  . Hypotension 07/20/2020  . Leukocytosis 07/20/2020  . Cellulitis of right lower extremity 07/20/2020  . Pressure injury of skin 07/12/2020  . Acute respiratory failure with hypoxia (HCC) 07/10/2020  . COPD with acute exacerbation (HCC)   . Elevated troponin   . Chronic diastolic CHF (congestive heart failure) (HCC) 05/24/2020  . Anasarca   . Acute renal failure superimposed on stage 3a chronic kidney disease (HCC)   . Hyperlipidemia   . Tobacco abuse   . Shortness of breath 04/28/2020  . Upper respiratory tract infection due to COVID-19 virus 04/28/2020  . Edema 04/28/2020  . Encounter for general adult medical examination with abnormal findings 01/15/2019  . Acute non-recurrent maxillary sinusitis 01/15/2019  . Gastroesophageal reflux disease without esophagitis 01/15/2019  . CAD in native artery  12/16/2018  . Morbid obesity with BMI of 50.0-59.9, adult (HCC) 12/16/2018  . Cigarette smoker motivated to quit 12/16/2018  . Chronic pain of right knee 08/04/2018  . Mild intermittent asthma 08/04/2018  . Impaired fasting glucose 04/03/2018  . Low back pain with right-sided sciatica 03/05/2018  . Chronic right-sided low back pain with right-sided sciatica 03/05/2018  . Pain in right hip 03/05/2018  . Hypertension 03/05/2018  . Foreign body in bladder and urethra 06/09/2013  . Kidney stone 06/09/2013  . Ureteric stone 06/09/2013  . Kidney stone 06/09/2013    Orientation RESPIRATION BLADDER Height & Weight     Self,Time,Situation,Place  O2 (3.5 L nasal canula) Continent Weight: (!) 419 lb 15.6 oz (190.5 kg) Height:  6\' 1"  (185.4 cm)  BEHAVIORAL SYMPTOMS/MOOD NEUROLOGICAL BOWEL NUTRITION STATUS   (None)  (None) Continent Diet (Heart healthy)  AMBULATORY STATUS COMMUNICATION OF NEEDS Skin   Extensive Assist Verbally Other (Comment) (Cracking.)                       Personal Care Assistance Level of Assistance              Functional Limitations Info  Sight,Hearing,Speech Sight Info: Adequate Hearing Info: Adequate Speech Info: Adequate    SPECIAL CARE FACTORS FREQUENCY  PT (By licensed PT)     PT Frequency: 5 x week              Contractures Contractures Info: Not present    Additional Factors Info  Code Status,Allergies Code Status Info: Full code Allergies Info: Morphine and related  Current Medications (05/07/2021):  This is the current hospital active medication list Current Facility-Administered Medications  Medication Dose Route Frequency Provider Last Rate Last Admin  . 0.9 %  sodium chloride infusion   Intravenous PRN Venora Maples, MD   Stopped at 05/06/21 0025  . acetaminophen (TYLENOL) tablet 650 mg  650 mg Oral Q4H PRN Venora Maples, MD      . albuterol (PROVENTIL) (2.5 MG/3ML) 0.083% nebulizer solution 2.5 mg  2.5 mg  Nebulization Q2H PRN Lewie Chamber, MD   2.5 mg at 05/06/21 1723  . apixaban (ELIQUIS) tablet 5 mg  5 mg Oral BID Venora Maples, MD   5 mg at 05/07/21 0912  . azithromycin (ZITHROMAX) tablet 500 mg  500 mg Oral Daily Venora Maples, MD   500 mg at 05/07/21 0912  . cefTRIAXone (ROCEPHIN) 2 g in sodium chloride 0.9 % 100 mL IVPB  2 g Intravenous Q24H Lewie Chamber, MD 200 mL/hr at 05/07/21 1018 2 g at 05/07/21 1018  . famotidine (PEPCID) tablet 20 mg  20 mg Oral BID Venora Maples, MD   20 mg at 05/07/21 1610  . furosemide (LASIX) tablet 20 mg  20 mg Oral Daily Venora Maples, MD   20 mg at 05/07/21 0912  . ipratropium-albuterol (DUONEB) 0.5-2.5 (3) MG/3ML nebulizer solution 3 mL  3 mL Nebulization TID Lewie Chamber, MD   3 mL at 05/07/21 0854  . lisinopril (ZESTRIL) tablet 10 mg  10 mg Oral Daily Venora Maples, MD   10 mg at 05/07/21 0912  . metoprolol succinate (TOPROL-XL) 24 hr tablet 50 mg  50 mg Oral Daily Venora Maples, MD   50 mg at 05/07/21 0912  . omega-3 acid ethyl esters (LOVAZA) capsule 1 g  1 g Oral Daily Sharen Hones, RPH   1 g at 05/07/21 9604  . ondansetron (ZOFRAN) injection 4 mg  4 mg Intravenous Q6H PRN Venora Maples, MD      . phenylephrine-shark liver oil-mineral oil-petrolatum (PREPARATION H) rectal ointment 1 application  1 application Rectal BID PRN Venora Maples, MD      . polyethylene glycol (MIRALAX / Ethelene Hal) packet 17 g  17 g Oral Daily Venora Maples, MD   17 g at 05/07/21 0911  . predniSONE (DELTASONE) tablet 40 mg  40 mg Oral Q breakfast Venora Maples, MD   40 mg at 05/07/21 0912  . rosuvastatin (CRESTOR) tablet 20 mg  20 mg Oral Daily Venora Maples, MD   20 mg at 05/07/21 0912  . senna-docusate (Senokot-S) tablet 1 tablet  1 tablet Oral BID Lewie Chamber, MD   1 tablet at 05/07/21 5409     Discharge Medications: Please see discharge summary for a list of discharge medications.  Relevant Imaging  Results:  Relevant Lab Results:   Additional Information SS#: [id number]  Margarito Liner, LCSW

## 2021-05-07 NOTE — Consult Note (Addendum)
PHARMACY - PHYSICIAN COMMUNICATION CRITICAL VALUE ALERT - BLOOD CULTURE IDENTIFICATION (BCID)  Thomas Tanner is an 70 y.o. male who presented to [facility] on 05/05/2021  Assessment:  Second anaerobic bottle grew GNR as detected in 1 anaerobic bottle yesterday. No BCID was ran on the second bottle.  Specimen sent to [facility] for further workup  (include suspected source if known)  Name of physician (or Provider) Contacted: [place]  Current antibiotics: Patient on azithromycin and ceftriaxone for coverage.  Changes to prescribed antibiotics recommended:  Patient is on recommended antibiotics - No changes needed. MD content with current coverage and desired no changes  Results for orders placed or performed during the hospital encounter of 05/05/21  Blood Culture ID Panel (Reflexed) (Collected: 05/05/2021  9:32 AM)  Result Value Ref Range   Enterococcus faecalis NOT DETECTED NOT DETECTED   Enterococcus Faecium NOT DETECTED NOT DETECTED   Listeria monocytogenes NOT DETECTED NOT DETECTED   Staphylococcus species NOT DETECTED NOT DETECTED   Staphylococcus aureus (BCID) NOT DETECTED NOT DETECTED   Staphylococcus epidermidis NOT DETECTED NOT DETECTED   Staphylococcus lugdunensis NOT DETECTED NOT DETECTED   Streptococcus species NOT DETECTED NOT DETECTED   Streptococcus agalactiae NOT DETECTED NOT DETECTED   Streptococcus pneumoniae NOT DETECTED NOT DETECTED   Streptococcus pyogenes NOT DETECTED NOT DETECTED   A.calcoaceticus-baumannii NOT DETECTED NOT DETECTED   Bacteroides fragilis NOT DETECTED NOT DETECTED   Enterobacterales NOT DETECTED NOT DETECTED   Enterobacter cloacae complex NOT DETECTED NOT DETECTED   Escherichia coli NOT DETECTED NOT DETECTED   Klebsiella aerogenes NOT DETECTED NOT DETECTED   Klebsiella oxytoca NOT DETECTED NOT DETECTED   Klebsiella pneumoniae NOT DETECTED NOT DETECTED   Proteus species NOT DETECTED NOT DETECTED   Salmonella species NOT DETECTED  NOT DETECTED   Serratia marcescens NOT DETECTED NOT DETECTED   Haemophilus influenzae NOT DETECTED NOT DETECTED   Neisseria meningitidis NOT DETECTED NOT DETECTED   Pseudomonas aeruginosa NOT DETECTED NOT DETECTED   Stenotrophomonas maltophilia NOT DETECTED NOT DETECTED   Candida albicans NOT DETECTED NOT DETECTED   Candida auris NOT DETECTED NOT DETECTED   Candida glabrata NOT DETECTED NOT DETECTED   Candida krusei NOT DETECTED NOT DETECTED   Candida parapsilosis NOT DETECTED NOT DETECTED   Candida tropicalis NOT DETECTED NOT DETECTED   Cryptococcus neoformans/gattii NOT DETECTED NOT DETECTED    [name] 05/07/2021  4:31 PM

## 2021-05-08 LAB — CBC WITH DIFFERENTIAL/PLATELET
Abs Immature Granulocytes: 0.09 10*3/uL — ABNORMAL HIGH (ref 0.00–0.07)
Basophils Absolute: 0 10*3/uL (ref 0.0–0.1)
Basophils Relative: 0 %
Eosinophils Absolute: 0 10*3/uL (ref 0.0–0.5)
Eosinophils Relative: 0 %
HCT: 38.1 % — ABNORMAL LOW (ref 39.0–52.0)
Hemoglobin: 13.6 g/dL (ref 13.0–17.0)
Immature Granulocytes: 1 %
Lymphocytes Relative: 9 %
Lymphs Abs: 1.4 10*3/uL (ref 0.7–4.0)
MCH: 33.2 pg (ref 26.0–34.0)
MCHC: 35.7 g/dL (ref 30.0–36.0)
MCV: 92.9 fL (ref 80.0–100.0)
Monocytes Absolute: 1.3 10*3/uL — ABNORMAL HIGH (ref 0.1–1.0)
Monocytes Relative: 9 %
Neutro Abs: 11.7 10*3/uL — ABNORMAL HIGH (ref 1.7–7.7)
Neutrophils Relative %: 81 %
Platelets: 271 10*3/uL (ref 150–400)
RBC: 4.1 MIL/uL — ABNORMAL LOW (ref 4.22–5.81)
RDW: 14.6 % (ref 11.5–15.5)
WBC: 14.4 10*3/uL — ABNORMAL HIGH (ref 4.0–10.5)
nRBC: 0 % (ref 0.0–0.2)

## 2021-05-08 LAB — URINE CULTURE: Culture: 80000 — AB

## 2021-05-08 LAB — BASIC METABOLIC PANEL
Anion gap: 10 (ref 5–15)
BUN: 21 mg/dL (ref 8–23)
CO2: 28 mmol/L (ref 22–32)
Calcium: 8.8 mg/dL — ABNORMAL LOW (ref 8.9–10.3)
Chloride: 97 mmol/L — ABNORMAL LOW (ref 98–111)
Creatinine, Ser: 0.85 mg/dL (ref 0.61–1.24)
GFR, Estimated: >60 mL/min (ref >60)
Glucose, Bld: 121 mg/dL — ABNORMAL HIGH (ref 70–99)
Potassium: 4.1 mmol/L (ref 3.5–5.1)
Sodium: 135 mmol/L (ref 135–145)

## 2021-05-08 LAB — MAGNESIUM: Magnesium: 2.1 mg/dL (ref 1.7–2.4)

## 2021-05-08 NOTE — TOC Progression Note (Addendum)
Transition of Care [facility]) - Progression Note    Patient Details  Name: Knight Oelkers MRN: [id number] Date of Birth: September 09, 1951  [facility]) CM/SW Contact  Margarito Liner, LCSW Phone Number: 05/08/2021, 11:57 AM  Clinical Narrative: No bed offers so far. Three denials. Expanded SNF search.    3:53 pm: Provided patient with bed offers: [facility] in [place], [place] in [place], and [facility] in [place]. Patient said [facility] and [facility] are top preference. Explained that [facility] only accepts patients that are fully vaccinated. [facility] is still pending. Asked admissions coordinator to review. Patient will review list provided for backup plan. CSW explained barrier to placement at certain facilities is weight limit they are able to accept.   Expected Discharge Plan: Skilled Nursing Facility Barriers to Discharge: Continued Medical Work up  Expected Discharge Plan and Services Expected Discharge Plan: Skilled Nursing Facility     Post Acute Care Choice: Skilled Nursing Facility Living arrangements for the past 2 months: Single Family Home                                       Social Determinants of Health (SDOH) Interventions    Readmission Risk Interventions No flowsheet data found.

## 2021-05-08 NOTE — Progress Notes (Signed)
Progress Note    Thomas Tanner   ZOX:[id number]  DOB: 12/31/1950  DOA: 05/05/2021     3  PCP: Theotis BurrowHarris, Taylor S, NP (Inactive)  CC: constipation, rectal pain, back pain  Hospital Course: Thomas Tanner is a 70 y.o. male with history of CHF, COPD on 3.5 L home oxygen, tobacco use disorder, recent saddle pulmonary embolism in August 2021, CAD, hypertension, chronic right sided low back pain with sciatica, who presented for back, CP, and rectal pain.  Patient reports the primary reason for presentation today was due to chest pains.  He reports that were sharp and very short in duration, started in the morning, located centrally in his chest, not associated with nausea or sweating.  He did not think that he felt like heart attack pains but he wanted to come and be evaluated anyways.  He has also been in significant distress because of constipation and rectal pain.  Reports that starting last week and he is extremely constipated, passed a very large caliber stool which he felt caused trauma.  Subsequently he is continue to be constipated and had a very painful rectum.  On history provided to ED physician he also endorsed a ripping tearing sensation in his abdomen as well.   In the ED initial vital signs notable for tachycardia to the 120s and tachypnea into the 30s as well as a soft-blood pressure of 105/70, this subsequently improved significantly with pulse in the low 100s, normal respiratory rate, and very mild hypertension.  Initial labs showed BMP at baseline, LFTs with mild elevation of total bili at 1.5, CBC with white count elevation of 14.  Initial troponin borderline positive at 18 and was flat at 20 two hours later.  Initial lactic acid was elevated at 5.7, repeat 2-1/2 hours later was above assay of 11.  Initial INR was 1.5, PTT normal.  COVID test was negative.  UA was not suspicious for infection.  EKG showed sinus tachycardia but was largely unchanged from prior.    Chest x-ray showed stable cardiomegaly and pulmonary vascular congestion.  Due to significant cardiovascular disease and sensation of chest/abdominal tearing, a CT chest abdomen and pelvis was obtained which showed diffuse atherosclerosis but no evidence of dissection, resolution of bilateral pulmonary emboli, and no acute intra-abdominal or pelvic findings.   He was treated with nebulizers, methylprednisone, 2L LR bolus, and broad-spectrum antibiotics.  Given overall presentation as well as lactic acidosis he was admitted for further work-up and management.  Interval History:  Patient feels that his constipation has improved.  No events overnight.  Reviewed blood culture findings again with patient this morning.  SNF search still ongoing also.  ROS: Constitutional: negative, Respiratory: positive for cough and dyspnea on exertion, Cardiovascular: negative for chest pain and Gastrointestinal: negative for abdominal pain  Assessment & Plan:  Gram-negative bacteremia -Possible translocation from his underlying severe constipation/stool burden.  Admission blood cultures now showing 2/4 bottles (anaerobic bottle from each set) positive with GNR from 05/05/21.  No speciation yet. - started Rocephin 05/07/21 in the morning - follow up cultures for speciation - repeating blood cultures on 05/08/21  Chest pain - resolved  COPD, possible exacerbation Patient's chest pain history not suggestive of cardiac ischemia.  EKG is unchanged from prior without any ischemic changes.  Troponin is flat and likely demand related -Complete 5-day course of azithromycin and prednisone - Continue chronic oxygen, currently on home setting of 3.5 L -Start on [place] and Spiriva - Outpatient referral to  pulmonology  Constipation Rectal pain Patient has been straining and had large bowel movement.  Rectal pain likely from ongoing stool burden - Continue laxative regimen - Continue Preparation H as needed  Physical  deconditioning - Patient less ambulatory than usual and endorses more difficulty with ambulating recently.  We discussed having PT evaluate and if requiring rehab, he is interested otherwise plan would be to discharge home -- SNF recommended; await offers  Lactic acidosis - resolved  Initial elevations appeared to be possibly reactive; low suspicion for infection or severe hypoperfusion  Bilateral renal masses Review of prior imaging shows that bilateral masses were present in 2014 and have both grown approximately 5 cm larger since then. - outpatient follow-up with urology.  History of PE-continue apixaban GERD-continue famotidine Chronic dCHF-continue Lasix, metoprolol; no s/s exacerbation  Hypertension- continue lisinopril Hyperlipidemia-continue rosuvastatin  Old records reviewed in assessment of this patient  Antimicrobials: Azithro 5/27 >> current  Rocephin 5/29 >> current   DVT prophylaxis:  apixaban (ELIQUIS) tablet 5 mg   Code Status:   Code Status: Full Code Family Communication:   Disposition Plan: Status is: Inpatient  Remains inpatient appropriate because:Inpatient level of care appropriate due to severity of illness   Dispo: The patient is from: Home              Anticipated d/c is to: SNF              Patient currently is not medically stable to d/c; awaiting speciation of bacteremia   Difficult to place patient No  Risk of unplanned readmission score: Unplanned Admission- Pilot do not use: 19.49   Objective: Blood pressure 126/66, pulse 85, temperature 98.2 F (36.8 C), temperature source Oral, resp. rate 20, height  (1.854 m), weight (!) 190.5 kg, SpO2 100 %.  Examination: General appearance: alert, cooperative, no distress and morbidly obese Head: Normocephalic, without obvious abnormality, atraumatic Eyes: EOMI Lungs: coarse sounds bilaterally Heart: regular rate and rhythm and S1, S2 normal Abdomen: normal findings: bowel sounds normal,  soft, non-tender and obese Extremities: obese, chronic edema LE noted Skin: xerosis Neurologic: Grossly normal  Consultants:     Procedures:     Data Reviewed: I have personally reviewed following labs and imaging studies Results for orders placed or performed during the hospital encounter of 05/05/21 (from the past 24 hour(s))  CULTURE, BLOOD (ROUTINE X 2) w Reflex to ID Panel     Status: None (Preliminary result)   Collection Time: 05/08/21  4:51 AM   Specimen: BLOOD  Result Value Ref Range   Specimen Description BLOOD BLOOD RIGHT HAND    Special Requests      BOTTLES DRAWN AEROBIC AND ANAEROBIC Blood Culture adequate volume   Culture      NO GROWTH < 12 HOURS Performed at [facility], [address] [place], [place] [address]    Report Status PENDING   Basic metabolic panel     Status: Abnormal   Collection Time: 05/08/21  4:51 AM  Result Value Ref Range   Sodium 135 135 - 145 mmol/L   Potassium 4.1 3.5 - 5.1 mmol/L   Chloride 97 (L) 98 - 111 mmol/L   CO2 28 22 - 32 mmol/L   Glucose, Bld 121 (H) 70 - 99 mg/dL   BUN 21 8 - 23 mg/dL   Creatinine, Ser 1.30 0.61 - 1.24 mg/dL   Calcium 8.8 (L) 8.9 - 10.3 mg/dL   GFR, Estimated >86 >57 mL/min   Anion  gap 10 5 - 15  CBC with Differential/Platelet     Status: Abnormal   Collection Time: 05/08/21  4:51 AM  Result Value Ref Range   WBC 14.4 (H) 4.0 - 10.5 K/uL   RBC 4.10 (L) 4.22 - 5.81 MIL/uL   Hemoglobin 13.6 13.0 - 17.0 g/dL   HCT 16.1 (L) 09.6 - 04.5 %   MCV 92.9 80.0 - 100.0 fL   MCH 33.2 26.0 - 34.0 pg   MCHC 35.7 30.0 - 36.0 g/dL   RDW 40.9 81.1 - 91.4 %   Platelets 271 150 - 400 K/uL   nRBC 0.0 0.0 - 0.2 %   Neutrophils Relative % 81 %   Neutro Abs 11.7 (H) 1.7 - 7.7 K/uL   Lymphocytes Relative 9 %   Lymphs Abs 1.4 0.7 - 4.0 K/uL   Monocytes Relative 9 %   Monocytes Absolute 1.3 (H) 0.1 - 1.0 K/uL   Eosinophils Relative 0 %   Eosinophils Absolute 0.0 0.0 - 0.5 K/uL   Basophils Relative 0 %    Basophils Absolute 0.0 0.0 - 0.1 K/uL   Immature Granulocytes 1 %   Abs Immature Granulocytes 0.09 (H) 0.00 - 0.07 K/uL  Magnesium     Status: None   Collection Time: 05/08/21  4:51 AM  Result Value Ref Range   Magnesium 2.1 1.7 - 2.4 mg/dL  CULTURE, BLOOD (ROUTINE X 2) w Reflex to ID Panel     Status: None (Preliminary result)   Collection Time: 05/08/21  4:51 AM   Specimen: BLOOD  Result Value Ref Range   Specimen Description BLOOD RIGHT ANTECUBITAL    Special Requests      BOTTLES DRAWN AEROBIC AND ANAEROBIC Blood Culture adequate volume   Culture      NO GROWTH < 12 HOURS Performed at [facility], [address], [place], [place] [address]    Report Status PENDING     Recent Results (from the past 240 hour(s))  Resp Panel by RT-PCR (Flu A&B, Covid) Nasopharyngeal Swab     Status: None   Collection Time: 05/05/21  9:32 AM   Specimen: Nasopharyngeal Swab; Nasopharyngeal(NP) swabs in vial transport medium  Result Value Ref Range Status   SARS Coronavirus 2 by RT PCR NEGATIVE NEGATIVE Final    Comment: (NOTE) SARS-CoV-2 target nucleic acids are NOT DETECTED.  The SARS-CoV-2 RNA is generally detectable in upper respiratory specimens during the acute phase of infection. The lowest concentration of SARS-CoV-2 viral copies this assay can detect is 138 copies/mL. A negative result does not preclude SARS-Cov-2 infection and should not be used as the sole basis for treatment or other patient management decisions. A negative result may occur with  improper specimen collection/handling, submission of specimen other than nasopharyngeal swab, presence of viral mutation(s) within the areas targeted by this assay, and inadequate number of viral copies(<138 copies/mL). A negative result must be combined with clinical observations, patient history, and epidemiological information. The expected result is Negative.  Fact Sheet for Patients:   BloggerCourse.com  Fact Sheet for Healthcare Providers:  SeriousBroker.it  This test is no t yet approved or cleared by the [place] FDA and  has been authorized for detection and/or diagnosis of SARS-CoV-2 by FDA under an Emergency Use Authorization (EUA). This EUA will remain  in effect (meaning this test can be used) for the duration of the COVID-19 declaration under Section 564(b)(1) of the Act, 21 U.S.C.section 360bbb-3(b)(1), unless the authorization is terminated  or revoked  sooner.       Influenza A by PCR NEGATIVE NEGATIVE Final   Influenza B by PCR NEGATIVE NEGATIVE Final    Comment: (NOTE) The Xpert Xpress SARS-CoV-2/FLU/RSV plus assay is intended as an aid in the diagnosis of influenza from Nasopharyngeal swab specimens and should not be used as a sole basis for treatment. Nasal washings and aspirates are unacceptable for Xpert Xpress SARS-CoV-2/FLU/RSV testing.  Fact Sheet for Patients: BloggerCourse.com  Fact Sheet for Healthcare Providers: SeriousBroker.it  This test is not yet approved or cleared by the [place] FDA and has been authorized for detection and/or diagnosis of SARS-CoV-2 by FDA under an Emergency Use Authorization (EUA). This EUA will remain in effect (meaning this test can be used) for the duration of the COVID-19 declaration under Section 564(b)(1) of the Act, 21 U.S.C. section 360bbb-3(b)(1), unless the authorization is terminated or revoked.  Performed at [facility], [address] [place], [place] [address]   Blood culture (routine single)     Status: None (Preliminary result)   Collection Time: 05/05/21  9:32 AM   Specimen: BLOOD  Result Value Ref Range Status   Specimen Description   Final    BLOOD LEFT ANTECUBITAL Performed at [facility], [address], [place], [place] [address]    Special  Requests   Final    BOTTLES DRAWN AEROBIC AND ANAEROBIC Blood Culture results may not be optimal due to an inadequate volume of blood received in culture bottles Performed at [facility], [address] [place], [place] [address]    Culture  Setup Time   Final    Organism ID to follow GRAM NEGATIVE RODS ANAEROBIC BOTTLE ONLY CRITICAL RESULT CALLED TO, READ BACK BY AND VERIFIED WITH: SUSAN WATSON ON 05/06/21 AT 1918 QSD Performed at [facility], [address], [place], [place] [address]    Culture   Final    Romie Minus NEGATIVE RODS CULTURE REINCUBATED FOR BETTER GROWTH Performed at [facility], [address], [place], [place] [address]    Report Status PENDING  Incomplete  Urine culture     Status: Abnormal   Collection Time: 05/05/21  9:32 AM   Specimen: In/Out Cath Urine  Result Value Ref Range Status   Specimen Description   Final    IN/OUT CATH URINE Performed at [facility], [address], [place], [place] [address]    Special Requests   Final    NONE Performed at [facility], [address] [place], [place] [address]    Culture (A)  Final    80,000 COLONIES/mL STAPHYLOCOCCUS EPIDERMIDIS >=100,000 COLONIES/mL AEROCOCCUS URINAE Standardized susceptibility testing for this organism is not available. Performed at [facility], [address], [place], [place] [address]    Report Status 05/08/2021 FINAL  Final   Organism ID, Bacteria STAPHYLOCOCCUS EPIDERMIDIS (A)  Final      Susceptibility   Staphylococcus epidermidis - MIC*    CIPROFLOXACIN <=0.5 SENSITIVE Sensitive     GENTAMICIN <=0.5 SENSITIVE Sensitive     NITROFURANTOIN <=16 SENSITIVE Sensitive     OXACILLIN >=4 RESISTANT Resistant     TETRACYCLINE <=1 SENSITIVE Sensitive     VANCOMYCIN 1 SENSITIVE Sensitive     TRIMETH/SULFA <=10 SENSITIVE Sensitive     CLINDAMYCIN <=0.25 SENSITIVE Sensitive     RIFAMPIN <=0.5 SENSITIVE Sensitive     Inducible Clindamycin  NEGATIVE Sensitive     * 80,000 COLONIES/mL STAPHYLOCOCCUS EPIDERMIDIS  Blood Culture  ID Panel (Reflexed)     Status: None   Collection Time: 05/05/21  9:32 AM  Result Value Ref Range Status   Enterococcus faecalis NOT DETECTED NOT DETECTED Final   Enterococcus Faecium NOT DETECTED NOT DETECTED Final   Listeria monocytogenes NOT DETECTED NOT DETECTED Final   Staphylococcus species NOT DETECTED NOT DETECTED Final   Staphylococcus aureus (BCID) NOT DETECTED NOT DETECTED Final   Staphylococcus epidermidis NOT DETECTED NOT DETECTED Final   Staphylococcus lugdunensis NOT DETECTED NOT DETECTED Final   Streptococcus species NOT DETECTED NOT DETECTED Final   Streptococcus agalactiae NOT DETECTED NOT DETECTED Final   Streptococcus pneumoniae NOT DETECTED NOT DETECTED Final   Streptococcus pyogenes NOT DETECTED NOT DETECTED Final   A.calcoaceticus-baumannii NOT DETECTED NOT DETECTED Final   Bacteroides fragilis NOT DETECTED NOT DETECTED Final   Enterobacterales NOT DETECTED NOT DETECTED Final   Enterobacter cloacae complex NOT DETECTED NOT DETECTED Final   Escherichia coli NOT DETECTED NOT DETECTED Final   Klebsiella aerogenes NOT DETECTED NOT DETECTED Final   Klebsiella oxytoca NOT DETECTED NOT DETECTED Final   Klebsiella pneumoniae NOT DETECTED NOT DETECTED Final   Proteus species NOT DETECTED NOT DETECTED Final   Salmonella species NOT DETECTED NOT DETECTED Final   Serratia marcescens NOT DETECTED NOT DETECTED Final   Haemophilus influenzae NOT DETECTED NOT DETECTED Final   Neisseria meningitidis NOT DETECTED NOT DETECTED Final   Pseudomonas aeruginosa NOT DETECTED NOT DETECTED Final   Stenotrophomonas maltophilia NOT DETECTED NOT DETECTED Final   Candida albicans NOT DETECTED NOT DETECTED Final   Candida auris NOT DETECTED NOT DETECTED Final   Candida glabrata NOT DETECTED NOT DETECTED Final   Candida krusei NOT DETECTED NOT DETECTED Final   Candida parapsilosis NOT DETECTED NOT DETECTED  Final   Candida tropicalis NOT DETECTED NOT DETECTED Final   Cryptococcus neoformans/gattii NOT DETECTED NOT DETECTED Final    Comment: Performed at [facility], [address] [place] [place], [place] [address]  Culture, blood (single)     Status: None (Preliminary result)   Collection Time: 05/05/21 11:06 AM   Specimen: BLOOD  Result Value Ref Range Status   Specimen Description   Final    BLOOD  LEFT FOREARM Performed at [facility], [address] [place], [place] [address]    Special Requests   Final    BOTTLES DRAWN AEROBIC AND ANAEROBIC Blood Culture results may not be optimal due to an inadequate volume of blood received in culture bottles Performed at [facility], [address] [place], [place] [address]    Culture  Setup Time   Final    GRAM NEGATIVE RODS ANAEROBIC BOTTLE ONLY CRITICAL RESULT CALLED TO, READ BACK BY AND VERIFIED WITH: SUSAN WATSON @ 1617 ON 05/07/2021 BY CAF Performed at [facility], [address], [place], [place] [address]    Culture   Final    Romie Minus NEGATIVE RODS CULTURE REINCUBATED FOR BETTER GROWTH Performed at [facility], [address], [place], [place] [address]    Report Status PENDING  Incomplete  Expectorated Sputum Assessment w Gram Stain, Rflx to Resp Cult     Status: None   Collection Time: 05/06/21 12:53 PM   Specimen: Sputum  Result Value Ref Range Status   Specimen Description SPUTUM  Final   Special Requests NONE  Final   Sputum evaluation   Final    Sputum specimen not acceptable for testing.  Please recollect.   CALLED TO ZOIA CISNEROS @1503   ON 05/06/21 SKL Performed at [facility], [address] [place], [place] [address]    Report Status 05/06/2021 FINAL  Final  CULTURE, BLOOD (ROUTINE X 2) w Reflex to ID Panel     Status: None (Preliminary result)   Collection Time: 05/08/21  4:51 AM   Specimen: BLOOD  Result Value Ref Range Status   Specimen Description BLOOD BLOOD  RIGHT HAND  Final   Special Requests   Final    BOTTLES DRAWN AEROBIC AND ANAEROBIC Blood Culture adequate volume   Culture   Final    NO GROWTH < 12 HOURS Performed at [facility], [address], [place], [place] [address]    Report Status PENDING  Incomplete  CULTURE, BLOOD (ROUTINE X 2) w Reflex to ID Panel     Status: None (Preliminary result)   Collection Time: 05/08/21  4:51 AM   Specimen: BLOOD  Result Value Ref Range Status   Specimen Description BLOOD RIGHT ANTECUBITAL  Final   Special Requests   Final    BOTTLES DRAWN AEROBIC AND ANAEROBIC Blood Culture adequate volume   Culture   Final    NO GROWTH < 12 HOURS Performed at [facility], [address], [place], [place] [address]    Report Status PENDING  Incomplete     Radiology Studies: No results found. CT Angio Chest/Abd/Pel for Dissection W and/or Wo Contrast  Final Result    DG Chest 2 View  Final Result      Scheduled Meds: . apixaban  5 mg Oral BID  . azithromycin  500 mg Oral Daily  . famotidine  20 mg Oral BID  . furosemide  20 mg Oral Daily  . lisinopril  10 mg Oral Daily  . metoprolol succinate  50 mg Oral Daily  . mometasone-formoterol  2 puff Inhalation BID  . omega-3 acid ethyl esters  1 g Oral Daily  . polyethylene glycol  17 g Oral Daily  . predniSONE  40 mg Oral Q breakfast  . rosuvastatin  20 mg Oral Daily  . senna-docusate  1 tablet Oral BID  . tiotropium  18 mcg Inhalation Daily   PRN Meds: sodium chloride, acetaminophen, albuterol, ondansetron (ZOFRAN) IV, phenylephrine-shark liver oil-mineral oil-petrolatum Continuous Infusions: . sodium chloride Stopped (05/06/21 0025)  . cefTRIAXone (ROCEPHIN)  IV 2 g (05/08/21 1127)     LOS: 3 days  Time spent: Greater than 50% of the 35 minute visit was spent in counseling/coordination of care for the patient as laid out in the A&P.   Lewie Chamber, MD [facility] 05/08/2021, 2:25 PM

## 2021-05-09 LAB — CBC WITH DIFFERENTIAL/PLATELET
Abs Immature Granulocytes: 0.08 10*3/uL — ABNORMAL HIGH (ref 0.00–0.07)
Basophils Absolute: 0 10*3/uL (ref 0.0–0.1)
Basophils Relative: 0 %
Eosinophils Absolute: 0 10*3/uL (ref 0.0–0.5)
Eosinophils Relative: 0 %
HCT: 37.4 % — ABNORMAL LOW (ref 39.0–52.0)
Hemoglobin: 13.7 g/dL (ref 13.0–17.0)
Immature Granulocytes: 1 %
Lymphocytes Relative: 9 %
Lymphs Abs: 1.3 10*3/uL (ref 0.7–4.0)
MCH: 34.6 pg — ABNORMAL HIGH (ref 26.0–34.0)
MCHC: 36.6 g/dL — ABNORMAL HIGH (ref 30.0–36.0)
MCV: 94.4 fL (ref 80.0–100.0)
Monocytes Absolute: 1.5 10*3/uL — ABNORMAL HIGH (ref 0.1–1.0)
Monocytes Relative: 10 %
Neutro Abs: 12.5 10*3/uL — ABNORMAL HIGH (ref 1.7–7.7)
Neutrophils Relative %: 80 %
Platelets: 277 10*3/uL (ref 150–400)
RBC: 3.96 MIL/uL — ABNORMAL LOW (ref 4.22–5.81)
RDW: 14.9 % (ref 11.5–15.5)
WBC: 15.4 10*3/uL — ABNORMAL HIGH (ref 4.0–10.5)
nRBC: 0 % (ref 0.0–0.2)

## 2021-05-09 LAB — MAGNESIUM: Magnesium: 1.9 mg/dL (ref 1.7–2.4)

## 2021-05-09 LAB — BASIC METABOLIC PANEL
Anion gap: 11 (ref 5–15)
BUN: 17 mg/dL (ref 8–23)
CO2: 31 mmol/L (ref 22–32)
Calcium: 9.2 mg/dL (ref 8.9–10.3)
Chloride: 94 mmol/L — ABNORMAL LOW (ref 98–111)
Creatinine, Ser: 0.91 mg/dL (ref 0.61–1.24)
GFR, Estimated: >60 mL/min (ref >60)
Glucose, Bld: 118 mg/dL — ABNORMAL HIGH (ref 70–99)
Potassium: 3.8 mmol/L (ref 3.5–5.1)
Sodium: 136 mmol/L (ref 135–145)

## 2021-05-09 LAB — CULTURE, BLOOD (SINGLE)

## 2021-05-09 LAB — RESP PANEL BY RT-PCR (FLU A&B, COVID) ARPGX2
Influenza A by PCR: NEGATIVE
Influenza B by PCR: NEGATIVE
SARS Coronavirus 2 by RT PCR: NEGATIVE

## 2021-05-09 MED ORDER — AMOXICILLIN-POT CLAVULANATE 875-125 MG PO TABS
1.0000 | ORAL_TABLET | Freq: Two times a day (BID) | ORAL | Status: DC
  Administered 2021-05-09 – 2021-05-11 (×5): 1 via ORAL
  Filled 2021-05-09 (×5): qty 1

## 2021-05-09 MED ORDER — MAGNESIUM CITRATE PO SOLN
1.0000 | Freq: Once | ORAL | Status: AC
  Administered 2021-05-09: 1 via ORAL
  Filled 2021-05-09: qty 296

## 2021-05-09 NOTE — Care Management Important Message (Signed)
Important Message  Patient Details  Name: Thomas Tanner MRN: [id number] Date of Birth: Jan 11, 1951   Medicare Important Message Given:  Yes     [name] 05/09/2021, 11:34 AM

## 2021-05-09 NOTE — Progress Notes (Signed)
Physical Therapy Treatment Patient Details Name: Thomas Tanner MRN: [id number] DOB: [birth year] Today's Date: 05/09/2021    History of Present Illness Thomas Tanner is a 70 y.o. male with history of CHF, COPD on 3-1/2 L home oxygen, tobacco use disorder, recent saddle pulmonary embolism in August 2021, CAD, hypertension, chronic right sided low back pain with sciatica, who presents for back and chest pain.    PT Comments     Pt was long sitting in bed upon arriving. He is agreeable to session and motivated to get OOB. Pt is severely limited throughout session by endurance. Becomes SOB with minimal activity but did not require much assistance. He was able to exit L side of bed without assist but required mod-max to return to supine from EOB sitting. Stood to bariatric RW 3 x from EOB with CGA + elevated bed height. Took steps to [facility] from FOB. Able to get to/from Bariatric [facility] 2 x however is impulsive. High fall risk due to impulsivity. Overall pt is progressing but will benefit from rehab at DC to address deficits with strength, gait, and overall safety during ADLs.    Follow Up Recommendations  SNF     Equipment Recommendations  Rolling walker with 5" wheels       Precautions / Restrictions Precautions Precautions: Fall Restrictions Weight Bearing Restrictions: No    Mobility  Bed Mobility Overal bed mobility: Needs Assistance Bed Mobility: Supine to Sit;Sit to Supine     Supine to sit: HOB elevated;Supervision Sit to supine: Mod assist;Max assist        Transfers Overall transfer level: Needs assistance Equipment used: Rolling walker (2 wheeled) (bariatric) Transfers: Sit to/from Stand Sit to Stand: Min guard;From elevated surface            Ambulation/Gait Ambulation/Gait assistance: Min guard Gait Distance (Feet): 4 Feet Assistive device: Rolling walker (2 wheeled) Gait Pattern/deviations: Step-to pattern Gait velocity: decreased    General Gait Details: Pt ambulated 4 ft with bariatric RW. Pt is severely limited by fatigue/endurance/activity tolerance   not strength       Balance Overall balance assessment: Needs assistance Sitting-balance support: Feet supported;Bilateral upper extremity supported Sitting balance-Leahy Scale: Fair     Standing balance support: Bilateral upper extremity supported;During functional activity Standing balance-Leahy Scale: Good Standing balance comment: pt able to sit EOB without assistance       Cognition Arousal/Alertness: Awake/alert Behavior During Therapy: WFL for tasks assessed/performed Overall Cognitive Status: Within Functional Limits for tasks assessed        General Comments: Pt is A and O x 4             Pertinent Vitals/Pain Pain Assessment: No/denies pain     PT Goals (current goals can now be found in the care plan section) Acute Rehab PT Goals Patient Stated Goal: to get back to walking Progress towards PT goals: Progressing toward goals    Frequency    Min 2X/week      PT Plan Current plan remains appropriate       AM-PAC PT "6 Clicks" Mobility   Outcome Measure  Help needed turning from your back to your side while in a flat bed without using bedrails?: A Little Help needed moving from lying on your back to sitting on the side of a flat bed without using bedrails?: A Little Help needed moving to and from a bed to a chair (including a wheelchair)?: A Little Help needed standing up from a chair using  your arms (e.g., wheelchair or bedside chair)?: A Little Help needed to walk in hospital room?: A Little Help needed climbing 3-5 steps with a railing? : Total 6 Click Score: 16    End of Session Equipment Utilized During Treatment: Oxygen Activity Tolerance: Patient tolerated treatment well;Patient limited by fatigue Patient left: in bed;with bed alarm set;with call bell/phone within reach Nurse Communication: Mobility status PT Visit  Diagnosis: Unsteadiness on feet (R26.81);Muscle weakness (generalized) (M62.81);Difficulty in walking, not elsewhere classified (R26.2)     Time: 0930-1002 PT Time Calculation (min) (ACUTE ONLY): 32 min  Charges:  $Therapeutic Activity: 23-37 mins                     Jetta Lout PTA 05/09/21, 12:33 PM

## 2021-05-09 NOTE — TOC Progression Note (Addendum)
Transition of Care [facility]) - Progression Note    Patient Details  Name: Thomas Tanner MRN: [id number] Date of Birth: 1951-11-14  Transition of Care [facility]) CM/SW Contact  Margarito Liner, LCSW Phone Number: 05/09/2021, 11:51 AM  Clinical Narrative: [facility] and [facility] have declined bed offers. Went by to notify patient but he was sleeping. Will come back later.    1:37 pm: Patient has chosen [facility] because it is closest out of his three offers to home. Admissions coordinator will know tomorrow when a bed will be available.  Expected Discharge Plan: Skilled Nursing Facility Barriers to Discharge: Continued Medical Work up  Expected Discharge Plan and Services Expected Discharge Plan: Skilled Nursing Facility     Post Acute Care Choice: Skilled Nursing Facility Living arrangements for the past 2 months: Single Family Home                                       Social Determinants of Health (SDOH) Interventions    Readmission Risk Interventions No flowsheet data found.

## 2021-05-09 NOTE — Progress Notes (Signed)
Order received from Dr Frederick Peers to discontinue telemetry and cont pulse ox

## 2021-05-09 NOTE — Progress Notes (Signed)
Progress Note    Thomas Tanner   ZOX:[id number]  DOB: Mar 01, 1951  DOA: 05/05/2021     4  PCP: Theotis Burrow, NP (Inactive)  CC: constipation, rectal pain, back pain  Hospital Course: Thomas Tanner is a 70 y.o. male with history of CHF, COPD on 3.5 L home oxygen, tobacco use disorder, recent saddle pulmonary embolism in August 2021, CAD, hypertension, chronic right sided low back pain with sciatica, who presented for back, CP, and rectal pain.  Patient reports the primary reason for presentation today was due to chest pains.  He reports that were sharp and very short in duration, started in the morning, located centrally in his chest, not associated with nausea or sweating.  He did not think that he felt like heart attack pains but he wanted to come and be evaluated anyways.  He has also been in significant distress because of constipation and rectal pain.  Reports that starting last week and he is extremely constipated, passed a very large caliber stool which he felt caused trauma.  Subsequently he is continue to be constipated and had a very painful rectum.  On history provided to ED physician he also endorsed a ripping tearing sensation in his abdomen as well.   In the ED initial vital signs notable for tachycardia to the 120s and tachypnea into the 30s as well as a soft-blood pressure of 105/70, this subsequently improved significantly with pulse in the low 100s, normal respiratory rate, and very mild hypertension.  Initial labs showed BMP at baseline, LFTs with mild elevation of total bili at 1.5, CBC with white count elevation of 14.  Initial troponin borderline positive at 18 and was flat at 20 two hours later.  Initial lactic acid was elevated at 5.7, repeat 2-1/2 hours later was above assay of 11.  Initial INR was 1.5, PTT normal.  COVID test was negative.  UA was not suspicious for infection.  EKG showed sinus tachycardia but was largely unchanged from prior.    Chest x-ray showed stable cardiomegaly and pulmonary vascular congestion.  Due to significant cardiovascular disease and sensation of chest/abdominal tearing, a CT chest abdomen and pelvis was obtained which showed diffuse atherosclerosis but no evidence of dissection, resolution of bilateral pulmonary emboli, and no acute intra-abdominal or pelvic findings.   He was treated with nebulizers, methylprednisone, 2L LR bolus, and broad-spectrum antibiotics.  Given overall presentation as well as lactic acidosis he was admitted for further work-up and management.  Interval History:  No events overnight.  Still complaining of feeling constipated this morning and no effect from MiraLAX or Senokot.  He is amenable to trying mag citrate as well. Still awaiting disposition planning for SNF.  Blood cultures are finally speciated today to Bacteroides.  Antibiotics being adjusted.  ROS: Constitutional: negative, Respiratory: positive for cough and dyspnea on exertion, Cardiovascular: negative for chest pain and Gastrointestinal: negative for abdominal pain  Assessment & Plan:  Bacteroides bacteremia -Possible translocation from his underlying severe constipation/stool burden.  Admission blood cultures now showing 2/4 bottles (anaerobic bottle from each set) positive with GNR (speciated to Bacteroides) from 05/05/21.  -DC Rocephin on 5/31 - Start Augmentin and complete 7-day course for adequate coverage -Follow-up repeat cultures from 05/08/2021  Chest pain - resolved  COPD, possible exacerbation Patient's chest pain history not suggestive of cardiac ischemia.  EKG is unchanged from prior without any ischemic changes.  Troponin is flat and likely demand related -Complete 5-day course of azithromycin and prednisone -  Continue chronic oxygen, currently on home setting of 3.5 L -Start on [place] - Outpatient referral to pulmonology  Constipation Rectal pain Patient has been straining and  had large bowel movement.  Rectal pain likely from ongoing stool burden - Continue laxative regimen - Continue Preparation H as needed -Trial of mag citrate  Physical deconditioning - Patient less ambulatory than usual and endorses more difficulty with ambulating recently.  We discussed having PT evaluate and if requiring rehab, he is interested otherwise plan would be to discharge home -- SNF recommended; await offers  Lactic acidosis - resolved  Initial elevations appeared to be possibly reactive; low suspicion for infection or severe hypoperfusion  Bilateral renal masses Review of prior imaging shows that bilateral masses were present in 2014 and have both grown approximately 5 cm larger since then. - outpatient follow-up with urology.  History of PE-continue apixaban GERD-continue famotidine Chronic dCHF-continue Lasix, metoprolol; no s/s exacerbation  Hypertension- continue lisinopril Hyperlipidemia-continue rosuvastatin  Old records reviewed in assessment of this patient  Antimicrobials: Azithro 5/27 >> current  Rocephin 5/29 >> 05/09/2021 Augmentin 05/09/2021 >> current  DVT prophylaxis:  apixaban (ELIQUIS) tablet 5 mg   Code Status:   Code Status: Full Code Family Communication:   Disposition Plan: Status is: Inpatient  Remains inpatient appropriate because:Inpatient level of care appropriate due to severity of illness   Dispo: The patient is from: Home              Anticipated d/c is to: SNF              Patient currently is medically stable to d/c   Difficult to place patient No  Risk of unplanned readmission score: Unplanned Admission- Pilot do not use: 17.22   Objective: Blood pressure (!) 124/55, pulse 89, temperature 98.2 F (36.8 C), temperature source Oral, resp. rate (!) 24, height 6\' 1"  (1.854 m), weight (!) 190.5 kg, SpO2 97 %.  Examination: General appearance: alert, cooperative, no distress and morbidly obese Head: Normocephalic, without  obvious abnormality, atraumatic Eyes: EOMI Lungs: coarse sounds bilaterally Heart: regular rate and rhythm and S1, S2 normal Abdomen: normal findings: bowel sounds normal, soft, non-tender and obese Extremities: obese, chronic edema LE noted Skin: xerosis Neurologic: Grossly normal  Consultants:     Procedures:     Data Reviewed: I have personally reviewed following labs and imaging studies Results for orders placed or performed during the hospital encounter of 05/05/21 (from the past 24 hour(s))  Basic metabolic panel     Status: Abnormal   Collection Time: 05/09/21  6:30 AM  Result Value Ref Range   Sodium 136 135 - 145 mmol/L   Potassium 3.8 3.5 - 5.1 mmol/L   Chloride 94 (L) 98 - 111 mmol/L   CO2 31 22 - 32 mmol/L   Glucose, Bld 118 (H) 70 - 99 mg/dL   BUN 17 8 - 23 mg/dL   Creatinine, Ser 05/11/21 0.61 - 1.24 mg/dL   Calcium 9.2 8.9 - 1.79 mg/dL   GFR, Estimated 15.0 >56 mL/min   Anion gap 11 5 - 15  CBC with Differential/Platelet     Status: Abnormal   Collection Time: 05/09/21  6:30 AM  Result Value Ref Range   WBC 15.4 (H) 4.0 - 10.5 K/uL   RBC 3.96 (L) 4.22 - 5.81 MIL/uL   Hemoglobin 13.7 13.0 - 17.0 g/dL   HCT 05/11/21 (L) 94.8 - 01.6 %   MCV 94.4 80.0 - 100.0 fL  MCH 34.6 (H) 26.0 - 34.0 pg   MCHC 36.6 (H) 30.0 - 36.0 g/dL   RDW 40.914.9 81.111.5 - 91.415.5 %   Platelets 277 150 - 400 K/uL   nRBC 0.0 0.0 - 0.2 %   Neutrophils Relative % 80 %   Neutro Abs 12.5 (H) 1.7 - 7.7 K/uL   Lymphocytes Relative 9 %   Lymphs Abs 1.3 0.7 - 4.0 K/uL   Monocytes Relative 10 %   Monocytes Absolute 1.5 (H) 0.1 - 1.0 K/uL   Eosinophils Relative 0 %   Eosinophils Absolute 0.0 0.0 - 0.5 K/uL   Basophils Relative 0 %   Basophils Absolute 0.0 0.0 - 0.1 K/uL   Immature Granulocytes 1 %   Abs Immature Granulocytes 0.08 (H) 0.00 - 0.07 K/uL  Magnesium     Status: None   Collection Time: 05/09/21  6:30 AM  Result Value Ref Range   Magnesium 1.9 1.7 - 2.4 mg/dL    Recent Results (from the  past 240 hour(s))  Resp Panel by RT-PCR (Flu A&B, Covid) Nasopharyngeal Swab     Status: None   Collection Time: 05/05/21  9:32 AM   Specimen: Nasopharyngeal Swab; Nasopharyngeal(NP) swabs in vial transport medium  Result Value Ref Range Status   SARS Coronavirus 2 by RT PCR NEGATIVE NEGATIVE Final    Comment: (NOTE) SARS-CoV-2 target nucleic acids are NOT DETECTED.  The SARS-CoV-2 RNA is generally detectable in upper respiratory specimens during the acute phase of infection. The lowest concentration of SARS-CoV-2 viral copies this assay can detect is 138 copies/mL. A negative result does not preclude SARS-Cov-2 infection and should not be used as the sole basis for treatment or other patient management decisions. A negative result may occur with  improper specimen collection/handling, submission of specimen other than nasopharyngeal swab, presence of viral mutation(s) within the areas targeted by this assay, and inadequate number of viral copies(<138 copies/mL). A negative result must be combined with clinical observations, patient history, and epidemiological information. The expected result is Negative.  Fact Sheet for Patients:  BloggerCourse.comhttps://www.fda.gov/media/152166/download  Fact Sheet for Healthcare Providers:  SeriousBroker.ithttps://www.fda.gov/media/152162/download  This test is no t yet approved or cleared by the [place] FDA and  has been authorized for detection and/or diagnosis of SARS-CoV-2 by FDA under an Emergency Use Authorization (EUA). This EUA will remain  in effect (meaning this test can be used) for the duration of the COVID-19 declaration under Section 564(b)(1) of the Act, 21 U.S.C.section 360bbb-3(b)(1), unless the authorization is terminated  or revoked sooner.       Influenza A by PCR NEGATIVE NEGATIVE Final   Influenza B by PCR NEGATIVE NEGATIVE Final    Comment: (NOTE) The Xpert Xpress SARS-CoV-2/FLU/RSV plus assay is intended as an aid in the diagnosis of  influenza from Nasopharyngeal swab specimens and should not be used as a sole basis for treatment. Nasal washings and aspirates are unacceptable for Xpert Xpress SARS-CoV-2/FLU/RSV testing.  Fact Sheet for Patients: BloggerCourse.comhttps://www.fda.gov/media/152166/download  Fact Sheet for Healthcare Providers: SeriousBroker.ithttps://www.fda.gov/media/152162/download  This test is not yet approved or cleared by the [place] FDA and has been authorized for detection and/or diagnosis of SARS-CoV-2 by FDA under an Emergency Use Authorization (EUA). This EUA will remain in effect (meaning this test can be used) for the duration of the COVID-19 declaration under Section 564(b)(1) of the Act, 21 U.S.C. section 360bbb-3(b)(1), unless the authorization is terminated or revoked.  Performed at [facility], [address] [place], [place] [address]  Blood culture (routine single)     Status: None (Preliminary result)   Collection Time: 05/05/21  9:32 AM   Specimen: BLOOD  Result Value Ref Range Status   Specimen Description   Final    BLOOD LEFT ANTECUBITAL Performed at [facility], [address], [place], [place] [address]    Special Requests   Final    BOTTLES DRAWN AEROBIC AND ANAEROBIC Blood Culture results may not be optimal due to an inadequate volume of blood received in culture bottles Performed at [facility], [address] [place], [place] [address]    Culture  Setup Time   Final    Organism ID to follow GRAM NEGATIVE RODS ANAEROBIC BOTTLE ONLY CRITICAL RESULT CALLED TO, READ BACK BY AND VERIFIED WITH: SUSAN WATSON ON 05/06/21 AT 1918 QSD Performed at [facility], [address], [place], [place] [address]    Culture   Final    Romie Minus NEGATIVE RODS CULTURE REINCUBATED FOR BETTER GROWTH Performed at [facility], [address], [place], [place] [address]    Report Status PENDING  Incomplete  Urine culture     Status: Abnormal   Collection Time:  05/05/21  9:32 AM   Specimen: In/Out Cath Urine  Result Value Ref Range Status   Specimen Description   Final    IN/OUT CATH URINE Performed at [facility], [address], [place], [place] [address]    Special Requests   Final    NONE Performed at [facility], [address] [place], [place] [address]    Culture (A)  Final    80,000 COLONIES/mL STAPHYLOCOCCUS EPIDERMIDIS >=100,000 COLONIES/mL AEROCOCCUS URINAE Standardized susceptibility testing for this organism is not available. Performed at [facility], [address], [place], [place] [address]    Report Status 05/08/2021 FINAL  Final   Organism ID, Bacteria STAPHYLOCOCCUS EPIDERMIDIS (A)  Final      Susceptibility   Staphylococcus epidermidis - MIC*    CIPROFLOXACIN <=0.5 SENSITIVE Sensitive     GENTAMICIN <=0.5 SENSITIVE Sensitive     NITROFURANTOIN <=16 SENSITIVE Sensitive     OXACILLIN >=4 RESISTANT Resistant     TETRACYCLINE <=1 SENSITIVE Sensitive     VANCOMYCIN 1 SENSITIVE Sensitive     TRIMETH/SULFA <=10 SENSITIVE Sensitive     CLINDAMYCIN <=0.25 SENSITIVE Sensitive     RIFAMPIN <=0.5 SENSITIVE Sensitive     Inducible Clindamycin NEGATIVE Sensitive     * 80,000 COLONIES/mL STAPHYLOCOCCUS EPIDERMIDIS  Blood Culture ID Panel (Reflexed)     Status: None   Collection Time: 05/05/21  9:32 AM  Result Value Ref Range Status   Enterococcus faecalis NOT DETECTED NOT DETECTED Final   Enterococcus Faecium NOT DETECTED NOT DETECTED Final   Listeria monocytogenes NOT DETECTED NOT DETECTED Final   Staphylococcus species NOT DETECTED NOT DETECTED Final   Staphylococcus aureus (BCID) NOT DETECTED NOT DETECTED Final   Staphylococcus epidermidis NOT DETECTED NOT DETECTED Final   Staphylococcus lugdunensis NOT DETECTED NOT DETECTED Final   Streptococcus species NOT DETECTED NOT DETECTED Final   Streptococcus agalactiae NOT DETECTED NOT DETECTED Final   Streptococcus pneumoniae NOT DETECTED NOT  DETECTED Final   Streptococcus pyogenes NOT DETECTED NOT DETECTED Final   A.calcoaceticus-baumannii NOT DETECTED NOT DETECTED Final   Bacteroides fragilis NOT DETECTED NOT DETECTED Final   Enterobacterales NOT DETECTED NOT DETECTED Final   Enterobacter cloacae complex NOT DETECTED NOT DETECTED Final   Escherichia coli NOT DETECTED NOT DETECTED  Final   Klebsiella aerogenes NOT DETECTED NOT DETECTED Final   Klebsiella oxytoca NOT DETECTED NOT DETECTED Final   Klebsiella pneumoniae NOT DETECTED NOT DETECTED Final   Proteus species NOT DETECTED NOT DETECTED Final   Salmonella species NOT DETECTED NOT DETECTED Final   Serratia marcescens NOT DETECTED NOT DETECTED Final   Haemophilus influenzae NOT DETECTED NOT DETECTED Final   Neisseria meningitidis NOT DETECTED NOT DETECTED Final   Pseudomonas aeruginosa NOT DETECTED NOT DETECTED Final   Stenotrophomonas maltophilia NOT DETECTED NOT DETECTED Final   Candida albicans NOT DETECTED NOT DETECTED Final   Candida auris NOT DETECTED NOT DETECTED Final   Candida glabrata NOT DETECTED NOT DETECTED Final   Candida krusei NOT DETECTED NOT DETECTED Final   Candida parapsilosis NOT DETECTED NOT DETECTED Final   Candida tropicalis NOT DETECTED NOT DETECTED Final   Cryptococcus neoformans/gattii NOT DETECTED NOT DETECTED Final    Comment: Performed at [facility], [address] [place], [place] [address]  Culture, blood (single)     Status: Abnormal   Collection Time: 05/05/21 11:06 AM   Specimen: BLOOD  Result Value Ref Range Status   Specimen Description   Final    BLOOD  LEFT FOREARM Performed at [facility], [address] [place], [place] [address]    Special Requests   Final    BOTTLES DRAWN AEROBIC AND ANAEROBIC Blood Culture results may not be optimal due to an inadequate volume of blood received in culture bottles Performed at [facility], [address] [place], [place] [address]    Culture  Setup Time    Final    GRAM NEGATIVE RODS ANAEROBIC BOTTLE ONLY CRITICAL RESULT CALLED TO, READ BACK BY AND VERIFIED WITH: SUSAN WATSON @ 1617 ON 05/07/2021 BY CAF Performed at [facility], [address] [place], [place] [address]    Culture (A)  Final    BACTEROIDES THETAIOTAOMICRON BETA LACTAMASE POSITIVE Performed at [facility], [address], [place], [place] [address]    Report Status 05/09/2021 FINAL  Final  Expectorated Sputum Assessment w Gram Stain, Rflx to Resp Cult     Status: None   Collection Time: 05/06/21 12:53 PM   Specimen: Sputum  Result Value Ref Range Status   Specimen Description SPUTUM  Final   Special Requests NONE  Final   Sputum evaluation   Final    Sputum specimen not acceptable for testing.  Please recollect.   CALLED TO ZOIA CISNEROS @1503  ON 05/06/21 SKL Performed at [facility], [address] [place], [place]    Report Status 05/06/2021 FINAL  Final  CULTURE, BLOOD (ROUTINE X 2) w Reflex to ID Panel     Status: None (Preliminary result)   Collection Time: 05/08/21  4:51 AM   Specimen: BLOOD  Result Value Ref Range Status   Specimen Description BLOOD BLOOD RIGHT HAND  Final   Special Requests   Final    BOTTLES DRAWN AEROBIC AND ANAEROBIC Blood Culture adequate volume   Culture   Final    NO GROWTH 1 DAY Performed at [facility], [address] [place], [place]    Report Status PENDING  Incomplete  CULTURE, BLOOD (ROUTINE X 2) w Reflex to ID Panel     Status: None (Preliminary result)   Collection Time: 05/08/21  4:51 AM   Specimen: BLOOD  Result Value Ref Range Status   Specimen Description BLOOD RIGHT ANTECUBITAL  Final  Special Requests   Final    BOTTLES DRAWN AEROBIC AND ANAEROBIC Blood Culture adequate volume   Culture   Final    NO GROWTH 1 DAY Performed at [name], [address], [place], [place] [address]    Report Status PENDING  Incomplete     Radiology  Studies: No results found. CT Angio Chest/Abd/Pel for Dissection W and/or Wo Contrast  Final Result    DG Chest 2 View  Final Result      Scheduled Meds: . amoxicillin-clavulanate  1 tablet Oral Q12H  . apixaban  5 mg Oral BID  . famotidine  20 mg Oral BID  . furosemide  20 mg Oral Daily  . lisinopril  10 mg Oral Daily  . metoprolol succinate  50 mg Oral Daily  . mometasone-formoterol  2 puff Inhalation BID  . omega-3 acid ethyl esters  1 g Oral Daily  . polyethylene glycol  17 g Oral Daily  . predniSONE  40 mg Oral Q breakfast  . rosuvastatin  20 mg Oral Daily  . senna-docusate  1 tablet Oral BID  . tiotropium  18 mcg Inhalation Daily   PRN Meds: sodium chloride, acetaminophen, albuterol, ondansetron (ZOFRAN) IV, phenylephrine-shark liver oil-mineral oil-petrolatum Continuous Infusions: . sodium chloride Stopped (05/06/21 0025)     LOS: 4 days  Time spent: Greater than 50% of the 35 minute visit was spent in counseling/coordination of care for the patient as laid out in the A&P.   Lewie Chamber, MD [facility] 05/09/2021, 12:04 PM

## 2021-05-10 LAB — BASIC METABOLIC PANEL
Anion gap: 10 (ref 5–15)
BUN: 19 mg/dL (ref 8–23)
CO2: 29 mmol/L (ref 22–32)
Calcium: 8.9 mg/dL (ref 8.9–10.3)
Chloride: 93 mmol/L — ABNORMAL LOW (ref 98–111)
Creatinine, Ser: 0.98 mg/dL (ref 0.61–1.24)
GFR, Estimated: >60 mL/min (ref >60)
Glucose, Bld: 154 mg/dL — ABNORMAL HIGH (ref 70–99)
Potassium: 4 mmol/L (ref 3.5–5.1)
Sodium: 132 mmol/L — ABNORMAL LOW (ref 135–145)

## 2021-05-10 LAB — CBC WITH DIFFERENTIAL/PLATELET
Abs Immature Granulocytes: 0.29 10*3/uL — ABNORMAL HIGH (ref 0.00–0.07)
Basophils Absolute: 0.1 10*3/uL (ref 0.0–0.1)
Basophils Relative: 0 %
Eosinophils Absolute: 0 10*3/uL (ref 0.0–0.5)
Eosinophils Relative: 0 %
HCT: 41.3 % (ref 39.0–52.0)
Hemoglobin: 14 g/dL (ref 13.0–17.0)
Immature Granulocytes: 1 %
Lymphocytes Relative: 4 %
Lymphs Abs: 0.8 10*3/uL (ref 0.7–4.0)
MCH: 31.1 pg (ref 26.0–34.0)
MCHC: 33.9 g/dL (ref 30.0–36.0)
MCV: 91.8 fL (ref 80.0–100.0)
Monocytes Absolute: 1.5 10*3/uL — ABNORMAL HIGH (ref 0.1–1.0)
Monocytes Relative: 7 %
Neutro Abs: 18.3 10*3/uL — ABNORMAL HIGH (ref 1.7–7.7)
Neutrophils Relative %: 88 %
Platelets: 303 10*3/uL (ref 150–400)
RBC: 4.5 MIL/uL (ref 4.22–5.81)
RDW: 14.6 % (ref 11.5–15.5)
WBC: 21 10*3/uL — ABNORMAL HIGH (ref 4.0–10.5)
nRBC: 0 % (ref 0.0–0.2)

## 2021-05-10 LAB — MAGNESIUM: Magnesium: 2.2 mg/dL (ref 1.7–2.4)

## 2021-05-10 NOTE — Progress Notes (Signed)
PROGRESS NOTE    Thomas Tanner  ZOX:[id number] DOB: 10-19-1951 DOA: 05/05/2021 PCP: Theotis Burrow, NP (Inactive)  Brief Narrative:  70 y.o.malewith history of CHF, COPD on 3.5 L home oxygen, tobacco use disorder, recent saddle pulmonary embolism in August 2021, CAD, hypertension, chronic right sided low back pain with sciatica, who presented for back, CP, and rectal pain.  Patient reports the primary reason for presentation today was due to chest pains. He reports that were sharp and very short in duration, started in the morning, located centrally in his chest, not associated with nausea or sweating. He did not think that he felt like heart attack pains but he wanted to come and be evaluated anyways.  He hasalso been in significant distress because of constipation and rectal pain. Reports that starting last week and he is extremely constipated, passed a very large caliber stool which he felt caused trauma. Subsequently he is continue to be constipated and had a very painful rectum.  Was found to have significant constipation without other obvious cause for initial presentation.  Also discovered to have Bacteroides bacteremia, likely etiology translocation secondary to underlying severe constipation and stool burden.   Assessment & Plan:   Principal Problem:   Constipated Active Problems:   Chronic right-sided low back pain with right-sided sciatica   Hypertension   CAD in native artery   Morbid obesity with BMI of 50.0-59.9, adult (HCC)   Cigarette smoker motivated to quit   COPD with acute exacerbation (HCC)   Physical deconditioning   Bacteremia due to Gram-negative bacteria  Bacteroides bacteremia -Possible translocation from his underlying severe constipation/stool burden.  Admission blood cultures now showing 2/4 bottles (anaerobic bottle from each set) positive with GNR (speciated to Bacteroides) from 05/05/21.  -DC Rocephin on 5/31 - Start Augmentin and  complete 7-day course for adequate coverage -Follow-up repeat cultures from 05/08/2021, NGTD  Chest pain - resolved  COPD, possible exacerbation Patient's chest pain history not suggestive of cardiac ischemia. EKG is unchanged from prior without any ischemic changes. Troponin is flat and likely demand related -Complete 5-day course of azithromycin and prednisone - Continue chronic oxygen, currently on home setting of 3.5 L -Start on [place] - Outpatient referral to pulmonology  Constipation Rectal pain Patient has been straining and had large bowel movement.  Rectal pain likely from ongoing stool burden - Continue laxative regimen - Continue Preparation H as needed -Multiple BMs over interval -Possible discharge 6/2 if white count decreasing (could be secondary to steroids)  Physical deconditioning - Patient less ambulatory than usual and endorses more difficulty with ambulating recently.  We discussed having PT evaluate and if requiring rehab, he is interested otherwise plan would be to discharge home -- SNF recommended; per [facility] plan to [facility] to [facility].  Anticipated date of discharge Friday, 05/12/2021  Lactic acidosis - resolved  Initial elevations appeared to be possibly reactive; low suspicion for infection or severe hypoperfusion  Bilateral renal masses Review of prior imaging shows that bilateral masses were present in 2014 and have both grown approximately 5 cm larger since then. - outpatient follow-up with urology.  History of PE-continue apixaban GERD-continue famotidine Chronic dCHF-continue Lasix, metoprolol; no s/s exacerbation  Hypertension-continue lisinopril Hyperlipidemia-continue rosuvastatin   DVT prophylaxis: Apixaban Code Status: Full Family Communication: None today.  Offered to call but patient declined Disposition Plan: Status is: Inpatient  Remains inpatient appropriate because:Inpatient level of care appropriate due  to severity of illness   Dispo: The  patient is from: Home              Anticipated d/c is to: SNF              Patient currently is not medically stable to d/c.   Difficult to place patient No  Monitor in house for least the next 24 hours to ensure downtrend of leukocytosis and overall hemodynamic stability     Level of care: Med-Surg  Consultants:   None  Procedures:   None  Antimicrobials:   None   Subjective: Patient seen and examined.  Endorses fatigue from a defecating frequently throughout the night.  No other complaints  Objective: Vitals:   05/10/21 0002 05/10/21 0454 05/10/21 0744 05/10/21 1100  BP: (!) 106/52 134/87 (!) 124/55 97/65  Pulse: (!) 101 85 100 93  Resp: 20 (!) 24 18 18   Temp: 98.9 F (37.2 C) 98.6 F (37 C) 98.4 F (36.9 C) 98 F (36.7 C)  TempSrc: Oral  Oral Oral  SpO2: 93% 94% 94% 98%  Weight:      Height:        Intake/Output Summary (Last 24 hours) at 05/10/2021 1445 Last data filed at 05/10/2021 0505 Gross per 24 hour  Intake --  Output 100 ml  Net -100 ml   Filed Weights   05/05/21 0846  Weight: (!) 190.5 kg    Examination:  General exam: Appears calm and comfortable  Respiratory system: Clear to auscultation. Respiratory effort normal. Cardiovascular system: S1 & S2 heard, RRR. No JVD, murmurs, rubs, gallops or clicks. No pedal edema. Gastrointestinal system: Obese, nontender, nondistended, normal bowel sounds Central nervous system: Alert and oriented. No focal neurological deficits. Extremities: Symmetric 5 x 5 power. Skin: No rashes, lesions or ulcers Psychiatry: Judgement and insight appear normal. Mood & affect appropriate.     Data Reviewed: I have personally reviewed following labs and imaging studies  CBC: Recent Labs  Lab 05/05/21 0851 05/06/21 0451 05/08/21 0451 05/09/21 0630 05/10/21 0518  WBC 14.8* 24.6* 14.4* 15.4* 21.0*  NEUTROABS  --   --  11.7* 12.5* 18.3*  HGB 14.9 14.0 13.6 13.7 14.0  HCT  45.7 42.9 38.1* 37.4* 41.3  MCV 91.2 94.3 92.9 94.4 91.8  PLT 249 218 271 277 303   Basic Metabolic Panel: Recent Labs  Lab 05/05/21 0851 05/06/21 0451 05/08/21 0451 05/09/21 0630 05/10/21 0518  NA 132* 133* 135 136 132*  K 4.0 4.7 4.1 3.8 4.0  CL 94* 97* 97* 94* 93*  CO2 24 27 28 31 29   GLUCOSE 158* 112* 121* 118* 154*  BUN 14 20 21 17 19   CREATININE 1.31* 1.13 0.85 0.91 0.98  CALCIUM 8.9 8.7* 8.8* 9.2 8.9  MG 1.9  --  2.1 1.9 2.2   GFR: Estimated Creatinine Clearance: 126.6 mL/min (by C-G formula based on SCr of 0.98 mg/dL). Liver Function Tests: Recent Labs  Lab 05/05/21 0851 05/06/21 0451  AST 24 15  ALT 12 13  ALKPHOS 74 62  BILITOT 1.5* 0.9  PROT 7.4 6.9  ALBUMIN 3.6 3.3*   No results for input(s): LIPASE, AMYLASE in the last 168 hours. No results for input(s): AMMONIA in the last 168 hours. Coagulation Profile: Recent Labs  Lab 05/05/21 0932  INR 1.5*   Cardiac Enzymes: No results for input(s): CKTOTAL, CKMB, CKMBINDEX, TROPONINI in the last 168 hours. BNP (last 3 results) No results for input(s): PROBNP in the last 8760 hours. HbA1C: No results for input(s): HGBA1C in the last  72 hours. CBG: No results for input(s): GLUCAP in the last 168 hours. Lipid Profile: No results for input(s): CHOL, HDL, LDLCALC, TRIG, CHOLHDL, LDLDIRECT in the last 72 hours. Thyroid Function Tests: No results for input(s): TSH, T4TOTAL, FREET4, T3FREE, THYROIDAB in the last 72 hours. Anemia Panel: No results for input(s): VITAMINB12, FOLATE, FERRITIN, TIBC, IRON, RETICCTPCT in the last 72 hours. Sepsis Labs: Recent Labs  Lab 05/05/21 0851 05/05/21 0929 05/05/21 1106 05/05/21 1243 05/05/21 1431  PROCALCITON 1.05  --   --   --   --   LATICACIDVEN  --  5.7* >11.0* 1.5 0.9    Recent Results (from the past 240 hour(s))  Resp Panel by RT-PCR (Flu A&B, Covid) Nasopharyngeal Swab     Status: None   Collection Time: 05/05/21  9:32 AM   Specimen: Nasopharyngeal Swab;  Nasopharyngeal(NP) swabs in vial transport medium  Result Value Ref Range Status   SARS Coronavirus 2 by RT PCR NEGATIVE NEGATIVE Final    Comment: (NOTE) SARS-CoV-2 target nucleic acids are NOT DETECTED.  The SARS-CoV-2 RNA is generally detectable in upper respiratory specimens during the acute phase of infection. The lowest concentration of SARS-CoV-2 viral copies this assay can detect is 138 copies/mL. A negative result does not preclude SARS-Cov-2 infection and should not be used as the sole basis for treatment or other patient management decisions. A negative result may occur with  improper specimen collection/handling, submission of specimen other than nasopharyngeal swab, presence of viral mutation(s) within the areas targeted by this assay, and inadequate number of viral copies(<138 copies/mL). A negative result must be combined with clinical observations, patient history, and epidemiological information. The expected result is Negative.  Fact Sheet for Patients:  BloggerCourse.comhttps://www.fda.gov/media/152166/download  Fact Sheet for Healthcare Providers:  SeriousBroker.ithttps://www.fda.gov/media/152162/download  This test is no t yet approved or cleared by the [place] FDA and  has been authorized for detection and/or diagnosis of SARS-CoV-2 by FDA under an Emergency Use Authorization (EUA). This EUA will remain  in effect (meaning this test can be used) for the duration of the COVID-19 declaration under Section 564(b)(1) of the Act, 21 U.S.C.section 360bbb-3(b)(1), unless the authorization is terminated  or revoked sooner.       Influenza A by PCR NEGATIVE NEGATIVE Final   Influenza B by PCR NEGATIVE NEGATIVE Final    Comment: (NOTE) The Xpert Xpress SARS-CoV-2/FLU/RSV plus assay is intended as an aid in the diagnosis of influenza from Nasopharyngeal swab specimens and should not be used as a sole basis for treatment. Nasal washings and aspirates are unacceptable for Xpert Xpress  SARS-CoV-2/FLU/RSV testing.  Fact Sheet for Patients: BloggerCourse.comhttps://www.fda.gov/media/152166/download  Fact Sheet for Healthcare Providers: SeriousBroker.ithttps://www.fda.gov/media/152162/download  This test is not yet approved or cleared by the [place] FDA and has been authorized for detection and/or diagnosis of SARS-CoV-2 by FDA under an Emergency Use Authorization (EUA). This EUA will remain in effect (meaning this test can be used) for the duration of the COVID-19 declaration under Section 564(b)(1) of the Act, 21 U.S.C. section 360bbb-3(b)(1), unless the authorization is terminated or revoked.  Performed at [facility], [address] [place], [place] [address]   Blood culture (routine single)     Status: None (Preliminary result)   Collection Time: 05/05/21  9:32 AM   Specimen: BLOOD  Result Value Ref Range Status   Specimen Description   Final    BLOOD LEFT ANTECUBITAL Performed at [facility], [address] [place], [place] [address]    Special  Requests   Final    BOTTLES DRAWN AEROBIC AND ANAEROBIC Blood Culture results may not be optimal due to an inadequate volume of blood received in culture bottles Performed at [facility], [address] [place], [place] [address]    Culture  Setup Time   Final    GRAM NEGATIVE RODS ANAEROBIC BOTTLE ONLY CRITICAL RESULT CALLED TO, READ BACK BY AND VERIFIED WITH: SUSAN WATSON ON 05/06/21 AT 1918 QSD    Culture   Final    GRAM NEGATIVE RODS CULTURE REINCUBATED FOR BETTER GROWTH Performed at [facility], [address] [place], [place] [address]    Report Status PENDING  Incomplete  Urine culture     Status: Abnormal   Collection Time: 05/05/21  9:32 AM   Specimen: In/Out Cath Urine  Result Value Ref Range Status   Specimen Description   Final    IN/OUT CATH URINE Performed at [facility], [address] [place], [place] [address]    Special Requests   Final    NONE Performed  at [facility], [address] [place], [place] [address]    Culture (A)  Final    80,000 COLONIES/mL STAPHYLOCOCCUS EPIDERMIDIS >=100,000 COLONIES/mL AEROCOCCUS URINAE Standardized susceptibility testing for this organism is not available. Performed at [facility], [address] [place], [place] [address]    Report Status 05/08/2021 FINAL  Final   Organism ID, Bacteria STAPHYLOCOCCUS EPIDERMIDIS (A)  Final      Susceptibility   Staphylococcus epidermidis - MIC*    CIPROFLOXACIN <=0.5 SENSITIVE Sensitive     GENTAMICIN <=0.5 SENSITIVE Sensitive     NITROFURANTOIN <=16 SENSITIVE Sensitive     OXACILLIN >=4 RESISTANT Resistant     TETRACYCLINE <=1 SENSITIVE Sensitive     VANCOMYCIN 1 SENSITIVE Sensitive     TRIMETH/SULFA <=10 SENSITIVE Sensitive     CLINDAMYCIN <=0.25 SENSITIVE Sensitive     RIFAMPIN <=0.5 SENSITIVE Sensitive     Inducible Clindamycin NEGATIVE Sensitive     * 80,000 COLONIES/mL STAPHYLOCOCCUS EPIDERMIDIS  Blood Culture ID Panel (Reflexed)     Status: None   Collection Time: 05/05/21  9:32 AM  Result Value Ref Range Status   Enterococcus faecalis NOT DETECTED NOT DETECTED Final   Enterococcus Faecium NOT DETECTED NOT DETECTED Final   Listeria monocytogenes NOT DETECTED NOT DETECTED Final   Staphylococcus species NOT DETECTED NOT DETECTED Final   Staphylococcus aureus (BCID) NOT DETECTED NOT DETECTED Final   Staphylococcus epidermidis NOT DETECTED NOT DETECTED Final   Staphylococcus lugdunensis NOT DETECTED NOT DETECTED Final   Streptococcus species NOT DETECTED NOT DETECTED Final   Streptococcus agalactiae NOT DETECTED NOT DETECTED Final   Streptococcus pneumoniae NOT DETECTED NOT DETECTED Final   Streptococcus pyogenes NOT DETECTED NOT DETECTED Final   A.calcoaceticus-baumannii NOT DETECTED NOT DETECTED Final   Bacteroides fragilis NOT DETECTED NOT DETECTED Final   Enterobacterales NOT DETECTED NOT DETECTED Final   Enterobacter cloacae  complex NOT DETECTED NOT DETECTED Final   Escherichia coli NOT DETECTED NOT DETECTED Final   Klebsiella aerogenes NOT DETECTED NOT DETECTED Final   Klebsiella oxytoca NOT DETECTED NOT DETECTED Final   Klebsiella pneumoniae NOT DETECTED NOT DETECTED Final   Proteus species NOT DETECTED NOT DETECTED Final   Salmonella species NOT DETECTED NOT DETECTED Final   Serratia marcescens NOT DETECTED NOT DETECTED Final   Haemophilus influenzae NOT DETECTED NOT DETECTED Final   Neisseria meningitidis NOT DETECTED NOT DETECTED Final  Pseudomonas aeruginosa NOT DETECTED NOT DETECTED Final   Stenotrophomonas maltophilia NOT DETECTED NOT DETECTED Final   Candida albicans NOT DETECTED NOT DETECTED Final   Candida auris NOT DETECTED NOT DETECTED Final   Candida glabrata NOT DETECTED NOT DETECTED Final   Candida krusei NOT DETECTED NOT DETECTED Final   Candida parapsilosis NOT DETECTED NOT DETECTED Final   Candida tropicalis NOT DETECTED NOT DETECTED Final   Cryptococcus neoformans/gattii NOT DETECTED NOT DETECTED Final    Comment: Performed at [facility], [address] [place], [place] [address]  Culture, blood (single)     Status: Abnormal   Collection Time: 05/05/21 11:06 AM   Specimen: BLOOD  Result Value Ref Range Status   Specimen Description   Final    BLOOD  LEFT FOREARM Performed at [facility], [address] [place], [place] [address]    Special Requests   Final    BOTTLES DRAWN AEROBIC AND ANAEROBIC Blood Culture results may not be optimal due to an inadequate volume of blood received in culture bottles Performed at [facility], [address] [place], [place] [address]    Culture  Setup Time   Final    GRAM NEGATIVE RODS ANAEROBIC BOTTLE ONLY CRITICAL RESULT CALLED TO, READ BACK BY AND VERIFIED WITH: SUSAN WATSON @ 1617 ON 05/07/2021 BY CAF Performed at [facility], [address] [place], [place] [address]    Culture (A)  Final     BACTEROIDES THETAIOTAOMICRON BETA LACTAMASE POSITIVE Performed at [facility], [address] [place], [place] [address]    Report Status 05/09/2021 FINAL  Final  Expectorated Sputum Assessment w Gram Stain, Rflx to Resp Cult     Status: None   Collection Time: 05/06/21 12:53 PM   Specimen: Sputum  Result Value Ref Range Status   Specimen Description SPUTUM  Final   Special Requests NONE  Final   Sputum evaluation   Final    Sputum specimen not acceptable for testing.  Please recollect.   CALLED TO ZOIA CISNEROS @1503  ON 05/06/21 SKL Performed at [facility], [address] [place], [place]    Report Status 05/06/2021 FINAL  Final  CULTURE, BLOOD (ROUTINE X 2) w Reflex to ID Panel     Status: None (Preliminary result)   Collection Time: 05/08/21  4:51 AM   Specimen: BLOOD  Result Value Ref Range Status   Specimen Description BLOOD BLOOD RIGHT HAND  Final   Special Requests   Final    BOTTLES DRAWN AEROBIC AND ANAEROBIC Blood Culture adequate volume   Culture   Final    NO GROWTH 2 DAYS Performed at [facility], [address] [place], [place]    Report Status PENDING  Incomplete  CULTURE, BLOOD (ROUTINE X 2) w Reflex to ID Panel     Status: None (Preliminary result)   Collection Time: 05/08/21  4:51 AM   Specimen: BLOOD  Result Value Ref Range Status   Specimen Description BLOOD RIGHT ANTECUBITAL  Final   Special Requests   Final    BOTTLES DRAWN AEROBIC AND ANAEROBIC Blood Culture adequate volume   Culture   Final    NO GROWTH 2 DAYS Performed at [facility], [address] [place], [place]    Report Status PENDING  Incomplete  Resp Panel by RT-PCR (Flu A&B, Covid) Nasopharyngeal Swab     Status: None   Collection Time: 05/09/21  4:54 PM  Specimen: Nasopharyngeal Swab; Nasopharyngeal(NP) swabs in vial transport medium  Result Value Ref Range Status   SARS Coronavirus 2 by RT PCR NEGATIVE NEGATIVE  Final    Comment: (NOTE) SARS-CoV-2 target nucleic acids are NOT DETECTED.  The SARS-CoV-2 RNA is generally detectable in upper respiratory specimens during the acute phase of infection. The lowest concentration of SARS-CoV-2 viral copies this assay can detect is 138 copies/mL. A negative result does not preclude SARS-Cov-2 infection and should not be used as the sole basis for treatment or other patient management decisions. A negative result may occur with  improper specimen collection/handling, submission of specimen other than nasopharyngeal swab, presence of viral mutation(s) within the areas targeted by this assay, and inadequate number of viral copies(<138 copies/mL). A negative result must be combined with clinical observations, patient history, and epidemiological information. The expected result is Negative.  Fact Sheet for Patients:  BloggerCourse.com  Fact Sheet for Healthcare Providers:  SeriousBroker.it  This test is no t yet approved or cleared by the [place] FDA and  has been authorized for detection and/or diagnosis of SARS-CoV-2 by FDA under an Emergency Use Authorization (EUA). This EUA will remain  in effect (meaning this test can be used) for the duration of the COVID-19 declaration under Section 564(b)(1) of the Act, 21 U.S.C.section 360bbb-3(b)(1), unless the authorization is terminated  or revoked sooner.       Influenza A by PCR NEGATIVE NEGATIVE Final   Influenza B by PCR NEGATIVE NEGATIVE Final    Comment: (NOTE) The Xpert Xpress SARS-CoV-2/FLU/RSV plus assay is intended as an aid in the diagnosis of influenza from Nasopharyngeal swab specimens and should not be used as a sole basis for treatment. Nasal washings and aspirates are unacceptable for Xpert Xpress SARS-CoV-2/FLU/RSV testing.  Fact Sheet for Patients: BloggerCourse.com  Fact Sheet for Healthcare  Providers: SeriousBroker.it  This test is not yet approved or cleared by the [place] FDA and has been authorized for detection and/or diagnosis of SARS-CoV-2 by FDA under an Emergency Use Authorization (EUA). This EUA will remain in effect (meaning this test can be used) for the duration of the COVID-19 declaration under Section 564(b)(1) of the Act, 21 U.S.C. section 360bbb-3(b)(1), unless the authorization is terminated or revoked.  Performed at [facility], [address] [place], [place] [address]          Radiology Studies: No results found.      Scheduled Meds: . amoxicillin-clavulanate  1 tablet Oral Q12H  . apixaban  5 mg Oral BID  . famotidine  20 mg Oral BID  . furosemide  20 mg Oral Daily  . lisinopril  10 mg Oral Daily  . metoprolol succinate  50 mg Oral Daily  . mometasone-formoterol  2 puff Inhalation BID  . omega-3 acid ethyl esters  1 g Oral Daily  . polyethylene glycol  17 g Oral Daily  . rosuvastatin  20 mg Oral Daily  . senna-docusate  1 tablet Oral BID  . tiotropium  18 mcg Inhalation Daily   Continuous Infusions: . sodium chloride Stopped (05/09/21 1249)     LOS: 5 days    Time spent: 15 minutes    Tresa Moore, MD [facility] Pager [phone]  If 7PM-7AM, please contact night-coverage 05/10/2021, 2:45 PM

## 2021-05-10 NOTE — Progress Notes (Signed)
Cross coverage   Call about patient who had a fall at 11:40 PM as he was transferring from bedside commode to bed.  Patient was being assisted by the tech who lowered him to the floor.  Did not hit head.  No LOC, no apparent injury.  Helped back to bed with [facility] lift.  Vital stable.

## 2021-05-10 NOTE — TOC Progression Note (Addendum)
Transition of Care [facility]) - Progression Note    Patient Details  Name: Thomas Tanner MRN: [id number] Date of Birth: 03-05-1951  [facility] CM/SW Contact  Margarito Liner, LCSW Phone Number: 05/10/2021, 8:57 AM  Clinical Narrative:  Will check with [facility] around 10:30 to check on bed availability. Typically SNF's have finished their morning meetings to discuss admissions/discharges by then.   10:08 am: [facility] won't have a bed until tomorrow or Friday, more likely Friday. Left message for [facility] admissions coordinator to see when they might have a bed available.  Expected Discharge Plan: Skilled Nursing Facility Barriers to Discharge: Continued Medical Work up  Expected Discharge Plan and Services Expected Discharge Plan: Skilled Nursing Facility     Post Acute Care Choice: Skilled Nursing Facility Living arrangements for the past 2 months: Single Family Home                                       Social Determinants of Health (SDOH) Interventions    Readmission Risk Interventions No flowsheet data found.

## 2021-05-10 NOTE — Progress Notes (Addendum)
Patient had a fall at about 2340. He was transferring back to bed after using [facility] with tech assistance and socks made him slip per patient. Tech assisted to lower down to floor. Had to use hoyer to get him back in bed. He is A&O x4, did not hit head, no dizziness or any symptoms reported. VS stable as below. Is resting comfortably now in bed, bed alarm on. Dr. Para March notified at 0008.      05/10/21 0002  What Happened  Was fall witnessed? Yes  Who witnessed fall? Mahala Menghini, NT  Patients activity before fall ambulating-assisted  Point of contact back (slid down, NT assisted)  Was patient injured? No  Follow Up  MD notified Dr. Para March  Time MD notified [phone]  Progress note created (see row info) Yes  Vitals  Temp 98.9 F (37.2 C)  Temp Source Oral  BP (!) 106/52  MAP (mmHg) 68  BP Location Right Arm  BP Method Automatic  Patient Position (if appropriate) Lying  Pulse Rate (!) 101  Resp 20  Oxygen Therapy  SpO2 93 %  O2 Device Nasal Cannula  O2 Flow Rate (L/min) 3 L/min  Pain Assessment  Pain Scale 0-10  Pain Score 0  Neurological  Neuro (WDL) WDL  Level of Consciousness Alert  Glasgow Coma Scale  Eye Opening 4  Best Verbal Response (NON-intubated) 5  Best Motor Response 6  Glasgow Coma Scale Score 15  Musculoskeletal  Musculoskeletal (WDL) X  Assistive Device None  Generalized Weakness Yes  Weight Bearing Restrictions No  Integumentary  Integumentary (WDL) X (nothing new from fall)  Skin Color Appropriate for ethnicity  Skin Condition Dry  Skin Integrity Cracking  Cracking Location Leg  Cracking Location Orientation Bilateral  Cracking Intervention Other (Comment) (assessed)

## 2021-05-11 ENCOUNTER — Inpatient Hospital Stay: Payer: Medicare Other

## 2021-05-11 LAB — BASIC METABOLIC PANEL
Anion gap: 11 (ref 5–15)
BUN: 21 mg/dL (ref 8–23)
CO2: 29 mmol/L (ref 22–32)
Calcium: 8.7 mg/dL — ABNORMAL LOW (ref 8.9–10.3)
Chloride: 93 mmol/L — ABNORMAL LOW (ref 98–111)
Creatinine, Ser: 1.08 mg/dL (ref 0.61–1.24)
GFR, Estimated: >60 mL/min (ref >60)
Glucose, Bld: 124 mg/dL — ABNORMAL HIGH (ref 70–99)
Potassium: 3.9 mmol/L (ref 3.5–5.1)
Sodium: 133 mmol/L — ABNORMAL LOW (ref 135–145)

## 2021-05-11 LAB — CBC WITH DIFFERENTIAL/PLATELET
Abs Immature Granulocytes: 0.36 10*3/uL — ABNORMAL HIGH (ref 0.00–0.07)
Basophils Absolute: 0 10*3/uL (ref 0.0–0.1)
Basophils Relative: 0 %
Eosinophils Absolute: 0 10*3/uL (ref 0.0–0.5)
Eosinophils Relative: 0 %
HCT: 39.2 % (ref 39.0–52.0)
Hemoglobin: 13.4 g/dL (ref 13.0–17.0)
Immature Granulocytes: 2 %
Lymphocytes Relative: 5 %
Lymphs Abs: 1 10*3/uL (ref 0.7–4.0)
MCH: 30.9 pg (ref 26.0–34.0)
MCHC: 34.2 g/dL (ref 30.0–36.0)
MCV: 90.3 fL (ref 80.0–100.0)
Monocytes Absolute: 1.7 10*3/uL — ABNORMAL HIGH (ref 0.1–1.0)
Monocytes Relative: 9 %
Neutro Abs: 16.3 10*3/uL — ABNORMAL HIGH (ref 1.7–7.7)
Neutrophils Relative %: 84 %
Platelets: 315 10*3/uL (ref 150–400)
RBC: 4.34 MIL/uL (ref 4.22–5.81)
RDW: 14.3 % (ref 11.5–15.5)
WBC: 19.4 10*3/uL — ABNORMAL HIGH (ref 4.0–10.5)
nRBC: 0 % (ref 0.0–0.2)

## 2021-05-11 LAB — PROCALCITONIN: Procalcitonin: 0.51 ng/mL

## 2021-05-11 LAB — MAGNESIUM: Magnesium: 2.3 mg/dL (ref 1.7–2.4)

## 2021-05-11 MED ORDER — FUROSEMIDE 10 MG/ML IJ SOLN: 40.0000 mg | Freq: Two times a day (BID) | INTRAMUSCULAR | Status: DC

## 2021-05-11 MED ORDER — OXYCODONE HCL 5 MG PO TABS: 5.0000 mg | ORAL_TABLET | ORAL | Status: DC | PRN

## 2021-05-11 MED ORDER — SODIUM CHLORIDE 0.9 % IV SOLN
3.0000 g | Freq: Four times a day (QID) | INTRAVENOUS | Status: DC
  Administered 2021-05-11 – 2021-05-16 (×20): 3 g via INTRAVENOUS
  Filled 2021-05-11: qty 3
  Filled 2021-05-11: qty 8
  Filled 2021-05-11: qty 3
  Filled 2021-05-11: qty 8
  Filled 2021-05-11: qty 3
  Filled 2021-05-11: qty 8
  Filled 2021-05-11 (×2): qty 3
  Filled 2021-05-11: qty 8
  Filled 2021-05-11 (×2): qty 3
  Filled 2021-05-11: qty 8
  Filled 2021-05-11 (×2): qty 3
  Filled 2021-05-11: qty 8
  Filled 2021-05-11: qty 3
  Filled 2021-05-11: qty 8
  Filled 2021-05-11 (×4): qty 3
  Filled 2021-05-11: qty 8
  Filled 2021-05-11: qty 3

## 2021-05-11 MED ORDER — KETOROLAC TROMETHAMINE 15 MG/ML IJ SOLN
15.0000 mg | Freq: Four times a day (QID) | INTRAMUSCULAR | Status: AC
  Administered 2021-05-11 – 2021-05-16 (×20): 15 mg via INTRAVENOUS
  Filled 2021-05-11 (×20): qty 1

## 2021-05-11 NOTE — Plan of Care (Signed)

## 2021-05-11 NOTE — Progress Notes (Signed)
Pt scrotum elevated to reduce edema.

## 2021-05-11 NOTE — Progress Notes (Signed)
PT Cancellation Note  Patient Details Name: Thomas Tanner MRN: [id number] DOB: 1951/05/16   Cancelled Treatment:     PT attempt. Pt politely refused due to pain from severe scrotal edema. Will hold PT today per pt request and return tomorrow. Pt was educated on importance of moving while in bed to prevent atrophy/weakness. States understanding and reports," I'll do what I can but it hurts every time I move."    [name] 05/11/2021, 9:38 AM

## 2021-05-11 NOTE — TOC Progression Note (Addendum)
Transition of Care [facility]) - Progression Note    Patient Details  Name: Thomas Tanner MRN: [id number] Date of Birth: Jul 05, 1951  Transition of Care [facility]) CM/SW Contact  Margarito Liner, LCSW Phone Number: 05/11/2021, 10:31 AM  Clinical Narrative:  [facility] has a bed available today. [facility] bed is already at facility. Uploaded clinicals into [employer] portal to start insurance authorization. COVID test was done on 5/31.   12:55 pm: Insurance authorization approved: [id number]. Valid 6/2-6/6.  2:54 pm: Updated patient.  Expected Discharge Plan: Skilled Nursing Facility Barriers to Discharge: Continued Medical Work up  Expected Discharge Plan and Services Expected Discharge Plan: Skilled Nursing Facility     Post Acute Care Choice: Skilled Nursing Facility Living arrangements for the past 2 months: Single Family Home                                       Social Determinants of Health (SDOH) Interventions    Readmission Risk Interventions No flowsheet data found.

## 2021-05-11 NOTE — Progress Notes (Signed)
PROGRESS NOTE    Thomas Tanner  RXV:[id number] DOB: 11/14/1951 DOA: 05/05/2021 PCP: Theotis Burrow, NP (Inactive)  Brief Narrative:  70 y.o.malewith history of CHF, COPD on 3.5 L home oxygen, tobacco use disorder, recent saddle pulmonary embolism in August 2021, CAD, hypertension, chronic right sided low back pain with sciatica, who presented for back, CP, and rectal pain.  Patient reports the primary reason for presentation today was due to chest pains. He reports that were sharp and very short in duration, started in the morning, located centrally in his chest, not associated with nausea or sweating. He did not think that he felt like heart attack pains but he wanted to come and be evaluated anyways.  He hasalso been in significant distress because of constipation and rectal pain. Reports that starting last week and he is extremely constipated, passed a very large caliber stool which he felt caused trauma. Subsequently he is continue to be constipated and had a very painful rectum.  Was found to have significant constipation without other obvious cause for initial presentation.  Also discovered to have Bacteroides bacteremia, likely etiology translocation secondary to underlying severe constipation and stool burden.  6/2: Constipation resolved.  Patient endorsing significant scrotal pain this morning.  Upon examination patient has a markedly enlarged and edematous scrotum.  See below for plan   Assessment & Plan:   Principal Problem:   Constipated Active Problems:   Chronic right-sided low back pain with right-sided sciatica   Hypertension   CAD in native artery   Morbid obesity with BMI of 50.0-59.9, adult (HCC)   Cigarette smoker motivated to quit   COPD with acute exacerbation (HCC)   Physical deconditioning   Bacteremia due to Gram-negative bacteria  Scrotal swelling Associated with pain Noted on physical exam Scrotal ultrasound negative for torsion,  demonstrate significant scrotal wall edema Relatively low suspicion for cellulitis Plan: Check procalcitonin, rule out evolving infection Increase Lasix to 40 mg IV twice daily Scrotal elevation Reassess in a.m.  Bacteroides bacteremia -Possible translocation from his underlying severe constipation/stool burden.  Admission blood cultures now showing 2/4 bottles (anaerobic bottle from each set) positive with GNR (speciated to Bacteroides) from 05/05/21.  -DC Rocephin on 5/31 - Start Augmentin and complete 7-day course for adequate coverage -Follow-up repeat cultures from 05/08/2021, NGTD  Chest pain - resolved  COPD, possible exacerbation Patient's chest pain history not suggestive of cardiac ischemia. EKG is unchanged from prior without any ischemic changes. Troponin is flat and likely demand related -Complete 5-day course of azithromycin and prednisone - Continue chronic oxygen, currently on home setting of 3.5 L -Start on [place] - Outpatient referral to pulmonology  Constipation Rectal pain Patient has been straining and had large bowel movement.  Rectal pain likely from ongoing stool burden - Continue laxative regimen - Continue Preparation H as needed -Multiple BMs over interval -Stable for discharge from the standpoint however needs further inpatient management including IV diuresis for scrotal swelling.  Anticipated date of discharge 05/13/2019  Physical deconditioning - Patient less ambulatory than usual and endorses more difficulty with ambulating recently.  We discussed having PT evaluate and if requiring rehab, he is interested otherwise plan would be to discharge home -- SNF recommended; per [facility] plan to discharge to [facility].  Anticipated date of discharge Friday, 05/12/2021  Lactic acidosis - resolved  Initial elevations appeared to be possibly reactive; low suspicion for infection or severe hypoperfusion  Bilateral renal masses Review of  prior imaging  shows that bilateral masses were present in 2014 and have both grown approximately 5 cm larger since then. - outpatient follow-up with urology.  History of PE-continue apixaban GERD-continue famotidine Chronic dCHF-continue Lasix, metoprolol; no s/s exacerbation  Hypertension-continue lisinopril Hyperlipidemia-continue rosuvastatin   DVT prophylaxis: Apixaban Code Status: Full Family Communication: None today.  Offered to call but patient declined Disposition Plan: Status is: Inpatient  Remains inpatient appropriate because:Inpatient level of care appropriate due to severity of illness   Dispo: The patient is from: Home              Anticipated d/c is to: SNF              Patient currently is not medically stable to d/c.   Difficult to place patient No  Scrotal swelling.  Increase Lasix, start IV.  Possible discharge 05/13/2019     Level of care: Med-Surg  Consultants:   None  Procedures:   None  Antimicrobials:   None   Subjective: Patient seen and examined.  Endorses fatigue from a defecating frequently throughout the night.  No other complaints  Objective: Vitals:   05/11/21 0656 05/11/21 0724 05/11/21 1020 05/11/21 1052  BP: 112/62 (!) 121/59 112/65 (!) 117/53  Pulse: 89  92 90  Resp: Temp: 98 F (36.7 C) 97.6 F (36.4 C)  98.1 F (36.7 C)  TempSrc:  Oral  Oral  SpO2: 90% 96%  94%  Weight:  (!) 189.5 kg    Height:   (1.854 m)      Intake/Output Summary (Last 24 hours) at 05/11/2021 1504 Last data filed at 05/11/2021 0700 Gross per 24 hour  Intake 240 ml  Output 220 ml  Net 20 ml   Filed Weights   05/05/21 0846 05/11/21 0724  Weight: (!) 190.5 kg (!) 189.5 kg    Examination:  General exam: Appears calm and comfortable  Respiratory system: Clear to auscultation. Respiratory effort normal. Cardiovascular system: S1 & S2 heard, RRR. No JVD, murmurs, rubs, gallops or clicks. No pedal edema. Gastrointestinal  system: Obese, nontender, nondistended, normal bowel sounds Central nervous system: Alert and oriented. No focal neurological deficits. Extremities: Symmetric 5 x 5 power. Skin: No rashes, lesions or ulcers Psychiatry: Judgement and insight appear normal. Mood & affect appropriate.     Data Reviewed: I have personally reviewed following labs and imaging studies  CBC: Recent Labs  Lab 05/06/21 0451 05/08/21 0451 05/09/21 0630 05/10/21 0518 05/11/21 0420  WBC 24.6* 14.4* 15.4* 21.0* 19.4*  NEUTROABS  --  11.7* 12.5* 18.3* 16.3*  HGB 14.0 13.6 13.7 14.0 13.4  HCT 42.9 38.1* 37.4* 41.3 39.2  MCV 94.3 92.9 94.4 91.8 90.3  PLT 218 271 277 303 315   Basic Metabolic Panel: Recent Labs  Lab 05/05/21 0851 05/06/21 0451 05/08/21 0451 05/09/21 0630 05/10/21 0518 05/11/21 0420  NA 132* 133* 135 136 132* 133*  K 4.0 4.7 4.1 3.8 4.0 3.9  CL 94* 97* 97* 94* 93* 93*  CO2 GLUCOSE 158* 112* 121* 118* 154* 124*  BUN CREATININE 1.31* 1.13 0.85 0.91 0.98 1.08  CALCIUM 8.9 8.7* 8.8* 9.2 8.9 8.7*  MG 1.9  --  2.1 1.9 2.2 2.3   GFR: Estimated Creatinine Clearance: 114.5 mL/min (by C-G formula based on SCr of 1.08 mg/dL). Liver Function Tests: Recent Labs  Lab 05/05/21 0851 05/06/21 0451  AST 24 15  ALT  12 13  ALKPHOS 74 62  BILITOT 1.5* 0.9  PROT 7.4 6.9  ALBUMIN 3.6 3.3*   No results for input(s): LIPASE, AMYLASE in the last 168 hours. No results for input(s): AMMONIA in the last 168 hours. Coagulation Profile: Recent Labs  Lab 05/05/21 0932  INR 1.5*   Cardiac Enzymes: No results for input(s): CKTOTAL, CKMB, CKMBINDEX, TROPONINI in the last 168 hours. BNP (last 3 results) No results for input(s): PROBNP in the last 8760 hours. HbA1C: No results for input(s): HGBA1C in the last 72 hours. CBG: No results for input(s): GLUCAP in the last 168 hours. Lipid Profile: No results for input(s): CHOL, HDL, LDLCALC, TRIG, CHOLHDL,  LDLDIRECT in the last 72 hours. Thyroid Function Tests: No results for input(s): TSH, T4TOTAL, FREET4, T3FREE, THYROIDAB in the last 72 hours. Anemia Panel: No results for input(s): VITAMINB12, FOLATE, FERRITIN, TIBC, IRON, RETICCTPCT in the last 72 hours. Sepsis Labs: Recent Labs  Lab 05/05/21 0851 05/05/21 0929 05/05/21 1106 05/05/21 1243 05/05/21 1431 05/11/21 0420  PROCALCITON 1.05  --   --   --   --  0.51  LATICACIDVEN  --  5.7* >11.0* 1.5 0.9  --     Recent Results (from the past 240 hour(s))  Resp Panel by RT-PCR (Flu A&B, Covid) Nasopharyngeal Swab     Status: None   Collection Time: 05/05/21  9:32 AM   Specimen: Nasopharyngeal Swab; Nasopharyngeal(NP) swabs in vial transport medium  Result Value Ref Range Status   SARS Coronavirus 2 by RT PCR NEGATIVE NEGATIVE Final    Comment: (NOTE) SARS-CoV-2 target nucleic acids are NOT DETECTED.  The SARS-CoV-2 RNA is generally detectable in upper respiratory specimens during the acute phase of infection. The lowest concentration of SARS-CoV-2 viral copies this assay can detect is 138 copies/mL. A negative result does not preclude SARS-Cov-2 infection and should not be used as the sole basis for treatment or other patient management decisions. A negative result may occur with  improper specimen collection/handling, submission of specimen other than nasopharyngeal swab, presence of viral mutation(s) within the areas targeted by this assay, and inadequate number of viral copies(<138 copies/mL). A negative result must be combined with clinical observations, patient history, and epidemiological information. The expected result is Negative.  Fact Sheet for Patients:  BloggerCourse.com  Fact Sheet for Healthcare Providers:  SeriousBroker.it  This test is no t yet approved or cleared by the [place] FDA and  has been authorized for detection and/or diagnosis of SARS-CoV-2  by FDA under an Emergency Use Authorization (EUA). This EUA will remain  in effect (meaning this test can be used) for the duration of the COVID-19 declaration under Section 564(b)(1) of the Act, 21 U.S.C.section 360bbb-3(b)(1), unless the authorization is terminated  or revoked sooner.       Influenza A by PCR NEGATIVE NEGATIVE Final   Influenza B by PCR NEGATIVE NEGATIVE Final    Comment: (NOTE) The Xpert Xpress SARS-CoV-2/FLU/RSV plus assay is intended as an aid in the diagnosis of influenza from Nasopharyngeal swab specimens and should not be used as a sole basis for treatment. Nasal washings and aspirates are unacceptable for Xpert Xpress SARS-CoV-2/FLU/RSV testing.  Fact Sheet for Patients: BloggerCourse.com  Fact Sheet for Healthcare Providers: SeriousBroker.it  This test is not yet approved or cleared by the [place] FDA and has been authorized for detection and/or diagnosis of SARS-CoV-2 by FDA under an Emergency Use Authorization (EUA). This EUA will remain in effect (meaning this test can be used)  for the duration of the COVID-19 declaration under Section 564(b)(1) of the Act, 21 U.S.C. section 360bbb-3(b)(1), unless the authorization is terminated or revoked.  Performed at [facility], [address] [place], [place] [address]   Blood culture (routine single)     Status: None (Preliminary result)   Collection Time: 05/05/21  9:32 AM   Specimen: BLOOD  Result Value Ref Range Status   Specimen Description   Final    BLOOD LEFT ANTECUBITAL Performed at [facility], [address] [place], [place] [address]    Special Requests   Final    BOTTLES DRAWN AEROBIC AND ANAEROBIC Blood Culture results may not be optimal due to an inadequate volume of blood received in culture bottles Performed at [facility], [address] [place], [place] [address]    Culture  Setup Time   Final     GRAM NEGATIVE RODS ANAEROBIC BOTTLE ONLY CRITICAL RESULT CALLED TO, READ BACK BY AND VERIFIED WITH: SUSAN WATSON ON 05/06/21 AT 1918 QSD    Culture   Final    GRAM NEGATIVE RODS IDENTIFICATION TO FOLLOW Performed at [facility], [address] [place], [place] [address]    Report Status PENDING  Incomplete  Urine culture     Status: Abnormal   Collection Time: 05/05/21  9:32 AM   Specimen: In/Out Cath Urine  Result Value Ref Range Status   Specimen Description   Final    IN/OUT CATH URINE Performed at [facility], [address] [place], [place] [address]    Special Requests   Final    NONE Performed at [facility], [address] [place], [place] [address]    Culture (A)  Final    80,000 COLONIES/mL STAPHYLOCOCCUS EPIDERMIDIS >=100,000 COLONIES/mL AEROCOCCUS URINAE Standardized susceptibility testing for this organism is not available. Performed at [facility], [address] [place], [place] [address]    Report Status 05/08/2021 FINAL  Final   Organism ID, Bacteria STAPHYLOCOCCUS EPIDERMIDIS (A)  Final      Susceptibility   Staphylococcus epidermidis - MIC*    CIPROFLOXACIN <=0.5 SENSITIVE Sensitive     GENTAMICIN <=0.5 SENSITIVE Sensitive     NITROFURANTOIN <=16 SENSITIVE Sensitive     OXACILLIN >=4 RESISTANT Resistant     TETRACYCLINE <=1 SENSITIVE Sensitive     VANCOMYCIN 1 SENSITIVE Sensitive     TRIMETH/SULFA <=10 SENSITIVE Sensitive     CLINDAMYCIN <=0.25 SENSITIVE Sensitive     RIFAMPIN <=0.5 SENSITIVE Sensitive     Inducible Clindamycin NEGATIVE Sensitive     * 80,000 COLONIES/mL STAPHYLOCOCCUS EPIDERMIDIS  Blood Culture ID Panel (Reflexed)     Status: None   Collection Time: 05/05/21  9:32 AM  Result Value Ref Range Status   Enterococcus faecalis NOT DETECTED NOT DETECTED Final   Enterococcus Faecium NOT DETECTED NOT DETECTED Final   Listeria monocytogenes NOT DETECTED NOT DETECTED Final   Staphylococcus species  NOT DETECTED NOT DETECTED Final   Staphylococcus aureus (BCID) NOT DETECTED NOT DETECTED Final   Staphylococcus epidermidis NOT DETECTED NOT DETECTED Final   Staphylococcus lugdunensis NOT DETECTED NOT DETECTED Final   Streptococcus species NOT DETECTED NOT DETECTED Final   Streptococcus agalactiae NOT DETECTED NOT DETECTED Final   Streptococcus pneumoniae NOT DETECTED NOT DETECTED Final   Streptococcus pyogenes NOT DETECTED NOT DETECTED Final   A.calcoaceticus-baumannii NOT DETECTED NOT DETECTED Final   Bacteroides fragilis NOT DETECTED NOT DETECTED Final   Enterobacterales NOT DETECTED  NOT DETECTED Final   Enterobacter cloacae complex NOT DETECTED NOT DETECTED Final   Escherichia coli NOT DETECTED NOT DETECTED Final   Klebsiella aerogenes NOT DETECTED NOT DETECTED Final   Klebsiella oxytoca NOT DETECTED NOT DETECTED Final   Klebsiella pneumoniae NOT DETECTED NOT DETECTED Final   Proteus species NOT DETECTED NOT DETECTED Final   Salmonella species NOT DETECTED NOT DETECTED Final   Serratia marcescens NOT DETECTED NOT DETECTED Final   Haemophilus influenzae NOT DETECTED NOT DETECTED Final   Neisseria meningitidis NOT DETECTED NOT DETECTED Final   Pseudomonas aeruginosa NOT DETECTED NOT DETECTED Final   Stenotrophomonas maltophilia NOT DETECTED NOT DETECTED Final   Candida albicans NOT DETECTED NOT DETECTED Final   Candida auris NOT DETECTED NOT DETECTED Final   Candida glabrata NOT DETECTED NOT DETECTED Final   Candida krusei NOT DETECTED NOT DETECTED Final   Candida parapsilosis NOT DETECTED NOT DETECTED Final   Candida tropicalis NOT DETECTED NOT DETECTED Final   Cryptococcus neoformans/gattii NOT DETECTED NOT DETECTED Final    Comment: Performed at [facility], [address] [place], [place] [address]  Culture, blood (single)     Status: Abnormal   Collection Time: 05/05/21 11:06 AM   Specimen: BLOOD  Result Value Ref Range Status   Specimen Description   Final     BLOOD  LEFT FOREARM Performed at [facility], [address] [place], [place] [address]    Special Requests   Final    BOTTLES DRAWN AEROBIC AND ANAEROBIC Blood Culture results may not be optimal due to an inadequate volume of blood received in culture bottles Performed at [facility], [address] [place], [place] [address]    Culture  Setup Time   Final    GRAM NEGATIVE RODS ANAEROBIC BOTTLE ONLY CRITICAL RESULT CALLED TO, READ BACK BY AND VERIFIED WITH: SUSAN WATSON @ 1617 ON 05/07/2021 BY CAF Performed at [facility], [address] [place], [place] [address]    Culture (A)  Final    BACTEROIDES THETAIOTAOMICRON BETA LACTAMASE POSITIVE Performed at [facility], [address] [place], [place] [address]    Report Status 05/09/2021 FINAL  Final  Expectorated Sputum Assessment w Gram Stain, Rflx to Resp Cult     Status: None   Collection Time: 05/06/21 12:53 PM   Specimen: Sputum  Result Value Ref Range Status   Specimen Description SPUTUM  Final   Special Requests NONE  Final   Sputum evaluation   Final    Sputum specimen not acceptable for testing.  Please recollect.   CALLED TO ZOIA CISNEROS @1503  ON 05/06/21 SKL Performed at [facility], [address] [place], [place]    Report Status 05/06/2021 FINAL  Final  CULTURE, BLOOD (ROUTINE X 2) w Reflex to ID Panel     Status: None (Preliminary result)   Collection Time: 05/08/21  4:51 AM   Specimen: BLOOD  Result Value Ref Range Status   Specimen Description BLOOD BLOOD RIGHT HAND  Final   Special Requests   Final    BOTTLES DRAWN AEROBIC AND ANAEROBIC Blood Culture adequate volume   Culture   Final    NO GROWTH 2 DAYS Performed at [facility], [address] [place], [place]    Report Status PENDING  Incomplete  CULTURE, BLOOD (ROUTINE X 2) w Reflex to ID Panel     Status: None (Preliminary result)   Collection Time: 05/08/21  4:51  AM    Specimen: BLOOD  Result Value Ref Range Status   Specimen Description BLOOD RIGHT ANTECUBITAL  Final   Special Requests   Final    BOTTLES DRAWN AEROBIC AND ANAEROBIC Blood Culture adequate volume   Culture   Final    NO GROWTH 2 DAYS Performed at [facility], [address], [place], [place] [address]    Report Status PENDING  Incomplete  Resp Panel by RT-PCR (Flu A&B, Covid) Nasopharyngeal Swab     Status: None   Collection Time: 05/09/21  4:54 PM   Specimen: Nasopharyngeal Swab; Nasopharyngeal(NP) swabs in vial transport medium  Result Value Ref Range Status   SARS Coronavirus 2 by RT PCR NEGATIVE NEGATIVE Final    Comment: (NOTE) SARS-CoV-2 target nucleic acids are NOT DETECTED.  The SARS-CoV-2 RNA is generally detectable in upper respiratory specimens during the acute phase of infection. The lowest concentration of SARS-CoV-2 viral copies this assay can detect is 138 copies/mL. A negative result does not preclude SARS-Cov-2 infection and should not be used as the sole basis for treatment or other patient management decisions. A negative result may occur with  improper specimen collection/handling, submission of specimen other than nasopharyngeal swab, presence of viral mutation(s) within the areas targeted by this assay, and inadequate number of viral copies(<138 copies/mL). A negative result must be combined with clinical observations, patient history, and epidemiological information. The expected result is Negative.  Fact Sheet for Patients:  BloggerCourse.com  Fact Sheet for Healthcare Providers:  SeriousBroker.it  This test is no t yet approved or cleared by the [place] FDA and  has been authorized for detection and/or diagnosis of SARS-CoV-2 by FDA under an Emergency Use Authorization (EUA). This EUA will remain  in effect (meaning this test can be used) for the duration of the COVID-19 declaration  under Section 564(b)(1) of the Act, 21 U.S.C.section 360bbb-3(b)(1), unless the authorization is terminated  or revoked sooner.       Influenza A by PCR NEGATIVE NEGATIVE Final   Influenza B by PCR NEGATIVE NEGATIVE Final    Comment: (NOTE) The Xpert Xpress SARS-CoV-2/FLU/RSV plus assay is intended as an aid in the diagnosis of influenza from Nasopharyngeal swab specimens and should not be used as a sole basis for treatment. Nasal washings and aspirates are unacceptable for Xpert Xpress SARS-CoV-2/FLU/RSV testing.  Fact Sheet for Patients: BloggerCourse.com  Fact Sheet for Healthcare Providers: SeriousBroker.it  This test is not yet approved or cleared by the [place] FDA and has been authorized for detection and/or diagnosis of SARS-CoV-2 by FDA under an Emergency Use Authorization (EUA). This EUA will remain in effect (meaning this test can be used) for the duration of the COVID-19 declaration under Section 564(b)(1) of the Act, 21 U.S.C. section 360bbb-3(b)(1), unless the authorization is terminated or revoked.  Performed at [facility], [address] [place], [place] [address]          Radiology Studies: US SCROTUM W/DOPPLER  Result Date: 05/11/2021 CLINICAL DATA:  70 year old male with testicular swelling for the past 4 days EXAM: SCROTAL ULTRASOUND DOPPLER ULTRASOUND OF THE TESTICLES TECHNIQUE: Complete ultrasound examination of the testicles, epididymis, and other scrotal structures was performed. Color and spectral Doppler ultrasound were also utilized to evaluate blood flow to the testicles. COMPARISON:  None. FINDINGS: Right testicle Measurements: 4.6 x 2.8 x 2.6 cm. No mass or microlithiasis visualized. Left testicle Measurements: 4.5 x 2.5 x 3.0 cm. No mass or microlithiasis visualized. Right epididymis:  Normal in size and appearance. Left epididymis:  Normal in size and appearance. Hydrocele:   None visualized. Varicocele:  None visualized. Pulsed Doppler interrogation of both testes demonstrates normal low resistance arterial and venous waveforms bilaterally. IMPRESSION: 1. Extensive thickening of the scrotum measuring up to 1.8 cm. Differential considerations include scrotal edema versus cellulitis. 2. Normal sonographic appearance of the testes without evidence of torsion. Electronically Signed   By: Malachy Moan M.D.   On: 05/11/2021 12:04        Scheduled Meds: . amoxicillin-clavulanate  1 tablet Oral Q12H  . apixaban  5 mg Oral BID  . famotidine  20 mg Oral BID  . furosemide  40 mg Intravenous BID  . ketorolac  15 mg Intravenous Q6H  . lisinopril  10 mg Oral Daily  . metoprolol succinate  50 mg Oral Daily  . mometasone-formoterol  2 puff Inhalation BID  . omega-3 acid ethyl esters  1 g Oral Daily  . polyethylene glycol  17 g Oral Daily  . rosuvastatin  20 mg Oral Daily  . senna-docusate  1 tablet Oral BID  . tiotropium  18 mcg Inhalation Daily   Continuous Infusions: . sodium chloride Stopped (05/09/21 1249)     LOS: 6 days    Time spent: 15 minutes    Tresa Moore, MD [facility] Pager [phone]  If 7PM-7AM, please contact night-coverage 05/11/2021, 3:04 PM

## 2021-05-12 LAB — CULTURE, BLOOD (SINGLE)

## 2021-05-12 LAB — CBC WITH DIFFERENTIAL/PLATELET
Abs Immature Granulocytes: 0.12 10*3/uL — ABNORMAL HIGH (ref 0.00–0.07)
Basophils Absolute: 0 10*3/uL (ref 0.0–0.1)
Basophils Relative: 0 %
Eosinophils Absolute: 0.1 10*3/uL (ref 0.0–0.5)
Eosinophils Relative: 1 %
HCT: 37.2 % — ABNORMAL LOW (ref 39.0–52.0)
Hemoglobin: 12.9 g/dL — ABNORMAL LOW (ref 13.0–17.0)
Immature Granulocytes: 1 %
Lymphocytes Relative: 6 %
Lymphs Abs: 1 10*3/uL (ref 0.7–4.0)
MCH: 31.9 pg (ref 26.0–34.0)
MCHC: 34.7 g/dL (ref 30.0–36.0)
MCV: 92.1 fL (ref 80.0–100.0)
Monocytes Absolute: 1.6 10*3/uL — ABNORMAL HIGH (ref 0.1–1.0)
Monocytes Relative: 9 %
Neutro Abs: 14.7 10*3/uL — ABNORMAL HIGH (ref 1.7–7.7)
Neutrophils Relative %: 83 %
Platelets: 324 10*3/uL (ref 150–400)
RBC: 4.04 MIL/uL — ABNORMAL LOW (ref 4.22–5.81)
RDW: 14.6 % (ref 11.5–15.5)
WBC: 17.6 10*3/uL — ABNORMAL HIGH (ref 4.0–10.5)
nRBC: 0 % (ref 0.0–0.2)

## 2021-05-12 LAB — BASIC METABOLIC PANEL
Anion gap: 13 (ref 5–15)
BUN: 27 mg/dL — ABNORMAL HIGH (ref 8–23)
CO2: 29 mmol/L (ref 22–32)
Calcium: 8.6 mg/dL — ABNORMAL LOW (ref 8.9–10.3)
Chloride: 91 mmol/L — ABNORMAL LOW (ref 98–111)
Creatinine, Ser: 1.31 mg/dL — ABNORMAL HIGH (ref 0.61–1.24)
GFR, Estimated: 59 mL/min — ABNORMAL LOW (ref >60)
Glucose, Bld: 99 mg/dL (ref 70–99)
Potassium: 4.1 mmol/L (ref 3.5–5.1)
Sodium: 133 mmol/L — ABNORMAL LOW (ref 135–145)

## 2021-05-12 LAB — MAGNESIUM: Magnesium: 2.5 mg/dL — ABNORMAL HIGH (ref 1.7–2.4)

## 2021-05-12 MED ORDER — FUROSEMIDE 10 MG/ML IJ SOLN
40.0000 mg | Freq: Every day | INTRAMUSCULAR | Status: DC
  Administered 2021-05-12 – 2021-05-16 (×5): 40 mg via INTRAVENOUS
  Filled 2021-05-12 (×5): qty 4

## 2021-05-12 NOTE — Progress Notes (Signed)
PT Cancellation Note  Patient Details Name: Randall Colden MRN: [id number] DOB: 11/09/1951   Cancelled Treatment:     PT attempt 2 x this date. Pt continues to have severe scrotal edema and unwilling to participate. " Try back tomorrow, I'll do something then." Acute PT will continue to follow and progress as able per POC.    [name] 05/12/2021, 1:33 PM

## 2021-05-12 NOTE — Care Management Important Message (Signed)
Important Message  Patient Details  Name: Thomas Tanner MRN: [id number] Date of Birth: 01-Apr-1951   Medicare Important Message Given:  Yes     [name] 05/12/2021, 10:34 AM

## 2021-05-12 NOTE — Progress Notes (Signed)
PROGRESS NOTE    Thomas Tanner  QMV:[id number] DOB: 06/02/1951 DOA: 05/05/2021 PCP: Theotis BurrowHarris, Taylor S, NP (Inactive)  Brief Narrative:  70 y.o.malewith history of CHF, COPD on 3.5 L home oxygen, tobacco use disorder, recent saddle pulmonary embolism in August 2021, CAD, hypertension, chronic right sided low back pain with sciatica, who presented for back, CP, and rectal pain.  Patient reports the primary reason for presentation today was due to chest pains. He reports that were sharp and very short in duration, started in the morning, located centrally in his chest, not associated with nausea or sweating. He did not think that he felt like heart attack pains but he wanted to come and be evaluated anyways.  He hasalso been in significant distress because of constipation and rectal pain. Reports that starting last week and he is extremely constipated, passed a very large caliber stool which he felt caused trauma. Subsequently he is continue to be constipated and had a very painful rectum.  Was found to have significant constipation without other obvious cause for initial presentation.  Also discovered to have Bacteroides bacteremia, likely etiology translocation secondary to underlying severe constipation and stool burden.  6/2: Constipation resolved.  Patient endorsing significant scrotal pain this morning.  Upon examination patient has a markedly enlarged and edematous scrotum.  See below for plan  6/3: Scrotal ultrasound with marked scrotal edema negative for torsion.  Patient was still with significant scrotal edema.   Assessment & Plan:   Principal Problem:   Constipated Active Problems:   Chronic right-sided low back pain with right-sided sciatica   Hypertension   CAD in native artery   Morbid obesity with BMI of 50.0-59.9, adult (HCC)   Cigarette smoker motivated to quit   COPD with acute exacerbation (HCC)   Physical deconditioning   Bacteremia due to  Gram-negative bacteria  Scrotal swelling Associated with pain Noted on physical exam Scrotal ultrasound negative for torsion, demonstrate significant scrotal wall edema Relatively low suspicion for cellulitis Procalcitonin inconclusive Plan: Lasix 40mg  IV daily Scrotal elevation Continue Unasyn Reassess in the morning  Bacteroides bacteremia -Possible translocation from his underlying severe constipation/stool burden.  Admission blood cultures now showing 2/4 bottles (anaerobic bottle from each set) positive with GNR (speciated to Bacteroides) from 05/05/21.  -DC Rocephin on 5/31 -  Augmentin discontinued due to lack of appropriate anaerobic coverage and question regarding penetration given body habitus.  After discussion with pharmacy will give Unasyn while in-house and consider transition to oral agent at time of discharge  Chest pain - resolved  COPD, possible exacerbation Patient's chest pain history not suggestive of cardiac ischemia. EKG is unchanged from prior without any ischemic changes. Troponin is flat and likely demand related -Completed 5-day course of azithromycin and prednisone - Continue chronic oxygen, currently on home setting of 3.5 L -Start on [place] and Spiriva - Outpatient referral to pulmonology  Constipation Rectal pain Patient has been straining and had large bowel movement.  Rectal pain likely from ongoing stool burden - Continue laxative regimen - Continue Preparation H as needed -Multiple BMs over interval -Stable for discharge from the standpoint however needs further inpatient management including IV diuresis for scrotal swelling.  Anticipated date of discharge 05/15/2019  Physical deconditioning - Patient less ambulatory than usual and endorses more difficulty with ambulating recently.  We discussed having PT evaluate and if requiring rehab, he is interested otherwise plan would be to discharge home -- SNF recommended; [facility]  At this point not stable for discharge.  Anticipated date of discharge 05/14/2021  Lactic acidosis - resolved  Initial elevations appeared to be possibly reactive; low suspicion for infection or severe hypoperfusion  Bilateral renal masses Review of prior imaging shows that bilateral masses were present in 2014 and have both grown approximately 5 cm larger since then. - outpatient follow-up with urology.  History of PE-continue apixaban GERD-continue famotidine Chronic dCHF-continue Lasix, metoprolol; no s/s exacerbation  Hypertension-continue lisinopril Hyperlipidemia-continue rosuvastatin   DVT prophylaxis: Apixaban Code Status: Full Family Communication: None today.  Offered to call but patient declined Disposition Plan: Status is: Inpatient  Remains inpatient appropriate because:Inpatient level of care appropriate due to severity of illness   Dispo: The patient is from: Home              Anticipated d/c is to: SNF              Patient currently is not medically stable to d/c.   Difficult to place patient No  Scrotal swelling.  On IV Lasix.  Possible discharge 05/14/2021     Level of care: Med-Surg  Consultants:   None  Procedures:   None  Antimicrobials:   None   Subjective: Patient seen and examined.  Continues to endorse severe pain in scrotum.  No other complaints  Objective: Vitals:   05/11/21 2046 05/12/21 0046 05/12/21 0449 05/12/21 1106  BP:  118/72 (!) 94/54 (!) 131/51  Pulse:  81 88 85  Resp: 18  17   Temp: 98 F (36.7 C) 97.7 F (36.5 C) 97.6 F (36.4 C) 97.6 F (36.4 C)  TempSrc:  Oral Oral Oral  SpO2: 98% 100% 95% 98%  Weight:      Height:        Intake/Output Summary (Last 24 hours) at 05/12/2021 1402 Last data filed at 05/12/2021 0900 Gross per 24 hour  Intake 577.5 ml  Output 300 ml  Net 277.5 ml   Filed Weights   05/05/21 0846 05/11/21 0724  Weight: (!) 190.5 kg (!) 189.5 kg     Examination:  General exam: Appears calm and comfortable  Respiratory system: Clear to auscultation. Respiratory effort normal. Cardiovascular system: S1 & S2 heard, RRR. No JVD, murmurs, rubs, gallops or clicks. No pedal edema. Gastrointestinal system: Obese, nontender, nondistended, normal bowel sounds Genitourinary: Markedly edematous: Central nervous system: Alert and oriented. No focal neurological deficits. Extremities: Symmetric 5 x 5 power. Skin: No rashes, lesions or ulcers Psychiatry: Judgement and insight appear normal. Mood & affect appropriate.     Data Reviewed: I have personally reviewed following labs and imaging studies  CBC: Recent Labs  Lab 05/08/21 0451 05/09/21 0630 05/10/21 0518 05/11/21 0420 05/12/21 0509  WBC 14.4* 15.4* 21.0* 19.4* 17.6*  NEUTROABS 11.7* 12.5* 18.3* 16.3* 14.7*  HGB 13.6 13.7 14.0 13.4 12.9*  HCT 38.1* 37.4* 41.3 39.2 37.2*  MCV 92.9 94.4 91.8 90.3 92.1  PLT 271 277 303 315 324   Basic Metabolic Panel: Recent Labs  Lab 05/08/21 0451 05/09/21 0630 05/10/21 0518 05/11/21 0420 05/12/21 0509  NA 135 136 132* 133* 133*  K 4.1 3.8 4.0 3.9 4.1  CL 97* 94* 93* 93* 91*  CO2 28 31 29 29 29   GLUCOSE 121* 118* 154* 124* 99  BUN 21 17 19 21  27*  CREATININE 0.85 0.91 0.98 1.08 1.31*  CALCIUM 8.8* 9.2 8.9 8.7* 8.6*  MG 2.1 1.9 2.2 2.3 2.5*   GFR: Estimated Creatinine Clearance: 94.4 mL/min (  A) (by C-G formula based on SCr of 1.31 mg/dL (H)). Liver Function Tests: Recent Labs  Lab 05/06/21 0451  AST 15  ALT 13  ALKPHOS 62  BILITOT 0.9  PROT 6.9  ALBUMIN 3.3*   No results for input(s): LIPASE, AMYLASE in the last 168 hours. No results for input(s): AMMONIA in the last 168 hours. Coagulation Profile: No results for input(s): INR, PROTIME in the last 168 hours. Cardiac Enzymes: No results for input(s): CKTOTAL, CKMB, CKMBINDEX, TROPONINI in the last 168 hours. BNP (last 3 results) No results for input(s): PROBNP in the  last 8760 hours. HbA1C: No results for input(s): HGBA1C in the last 72 hours. CBG: No results for input(s): GLUCAP in the last 168 hours. Lipid Profile: No results for input(s): CHOL, HDL, LDLCALC, TRIG, CHOLHDL, LDLDIRECT in the last 72 hours. Thyroid Function Tests: No results for input(s): TSH, T4TOTAL, FREET4, T3FREE, THYROIDAB in the last 72 hours. Anemia Panel: No results for input(s): VITAMINB12, FOLATE, FERRITIN, TIBC, IRON, RETICCTPCT in the last 72 hours. Sepsis Labs: Recent Labs  Lab 05/05/21 1431 05/11/21 0420  PROCALCITON  --  0.51  LATICACIDVEN 0.9  --     Recent Results (from the past 240 hour(s))  Resp Panel by RT-PCR (Flu A&B, Covid) Nasopharyngeal Swab     Status: None   Collection Time: 05/05/21  9:32 AM   Specimen: Nasopharyngeal Swab; Nasopharyngeal(NP) swabs in vial transport medium  Result Value Ref Range Status   SARS Coronavirus 2 by RT PCR NEGATIVE NEGATIVE Final    Comment: (NOTE) SARS-CoV-2 target nucleic acids are NOT DETECTED.  The SARS-CoV-2 RNA is generally detectable in upper respiratory specimens during the acute phase of infection. The lowest concentration of SARS-CoV-2 viral copies this assay can detect is 138 copies/mL. A negative result does not preclude SARS-Cov-2 infection and should not be used as the sole basis for treatment or other patient management decisions. A negative result may occur with  improper specimen collection/handling, submission of specimen other than nasopharyngeal swab, presence of viral mutation(s) within the areas targeted by this assay, and inadequate number of viral copies(<138 copies/mL). A negative result must be combined with clinical observations, patient history, and epidemiological information. The expected result is Negative.  Fact Sheet for Patients:  BloggerCourse.com  Fact Sheet for Healthcare Providers:  SeriousBroker.it  This test is no t yet  approved or cleared by the [place] FDA and  has been authorized for detection and/or diagnosis of SARS-CoV-2 by FDA under an Emergency Use Authorization (EUA). This EUA will remain  in effect (meaning this test can be used) for the duration of the COVID-19 declaration under Section 564(b)(1) of the Act, 21 U.S.C.section 360bbb-3(b)(1), unless the authorization is terminated  or revoked sooner.       Influenza A by PCR NEGATIVE NEGATIVE Final   Influenza B by PCR NEGATIVE NEGATIVE Final    Comment: (NOTE) The Xpert Xpress SARS-CoV-2/FLU/RSV plus assay is intended as an aid in the diagnosis of influenza from Nasopharyngeal swab specimens and should not be used as a sole basis for treatment. Nasal washings and aspirates are unacceptable for Xpert Xpress SARS-CoV-2/FLU/RSV testing.  Fact Sheet for Patients: BloggerCourse.com  Fact Sheet for Healthcare Providers: SeriousBroker.it  This test is not yet approved or cleared by the [place] FDA and has been authorized for detection and/or diagnosis of SARS-CoV-2 by FDA under an Emergency Use Authorization (EUA). This EUA will remain in effect (meaning this test can be used) for the duration of  the COVID-19 declaration under Section 564(b)(1) of the Act, 21 U.S.C. section 360bbb-3(b)(1), unless the authorization is terminated or revoked.  Performed at [facility], [address] [place], [place] [address]   Blood culture (routine single)     Status: Abnormal   Collection Time: 05/05/21  9:32 AM   Specimen: BLOOD  Result Value Ref Range Status   Specimen Description   Final    BLOOD LEFT ANTECUBITAL Performed at [facility], [address] [place], [place] [address]    Special Requests   Final    BOTTLES DRAWN AEROBIC AND ANAEROBIC Blood Culture results may not be optimal due to an inadequate volume of blood received in culture bottles Performed at  [facility], [address] [place], [place] [address]    Culture  Setup Time   Final    GRAM NEGATIVE RODS ANAEROBIC BOTTLE ONLY CRITICAL RESULT CALLED TO, READ BACK BY AND VERIFIED WITH: SUSAN WATSON ON 05/06/21 AT 1918 QSD    Culture (A)  Final    ANAEROBIC GRAM NEGATIVE ROD UNABLE TO FURTHER ID Performed at [facility], [address] [place], [place] [address]    Report Status 05/12/2021 FINAL  Final  Urine culture     Status: Abnormal   Collection Time: 05/05/21  9:32 AM   Specimen: In/Out Cath Urine  Result Value Ref Range Status   Specimen Description   Final    IN/OUT CATH URINE Performed at [facility], [address], [place], [place] [address]    Special Requests   Final    NONE Performed at [facility], [address] [place], [place] [address]    Culture (A)  Final    80,000 COLONIES/mL STAPHYLOCOCCUS EPIDERMIDIS >=100,000 COLONIES/mL AEROCOCCUS URINAE Standardized susceptibility testing for this organism is not available. Performed at [facility], [address], [place], [place] [address]    Report Status 05/08/2021 FINAL  Final   Organism ID, Bacteria STAPHYLOCOCCUS EPIDERMIDIS (A)  Final      Susceptibility   Staphylococcus epidermidis - MIC*    CIPROFLOXACIN <=0.5 SENSITIVE Sensitive     GENTAMICIN <=0.5 SENSITIVE Sensitive     NITROFURANTOIN <=16 SENSITIVE Sensitive     OXACILLIN >=4 RESISTANT Resistant     TETRACYCLINE <=1 SENSITIVE Sensitive     VANCOMYCIN 1 SENSITIVE Sensitive     TRIMETH/SULFA <=10 SENSITIVE Sensitive     CLINDAMYCIN <=0.25 SENSITIVE Sensitive     RIFAMPIN <=0.5 SENSITIVE Sensitive     Inducible Clindamycin NEGATIVE Sensitive     * 80,000 COLONIES/mL STAPHYLOCOCCUS EPIDERMIDIS  Blood Culture ID Panel (Reflexed)     Status: None   Collection Time: 05/05/21  9:32 AM  Result Value Ref Range Status   Enterococcus faecalis NOT DETECTED NOT DETECTED Final   Enterococcus Faecium NOT  DETECTED NOT DETECTED Final   Listeria monocytogenes NOT DETECTED NOT DETECTED Final   Staphylococcus species NOT DETECTED NOT DETECTED Final   Staphylococcus aureus (BCID) NOT DETECTED NOT DETECTED Final   Staphylococcus epidermidis NOT DETECTED NOT DETECTED Final   Staphylococcus lugdunensis NOT DETECTED NOT DETECTED Final   Streptococcus species NOT DETECTED NOT DETECTED Final   Streptococcus agalactiae NOT DETECTED NOT DETECTED Final   Streptococcus pneumoniae NOT DETECTED NOT DETECTED Final   Streptococcus pyogenes NOT DETECTED NOT DETECTED Final   A.calcoaceticus-baumannii NOT DETECTED NOT DETECTED Final   Bacteroides fragilis NOT DETECTED NOT DETECTED Final   Enterobacterales NOT DETECTED NOT DETECTED Final  Enterobacter cloacae complex NOT DETECTED NOT DETECTED Final   Escherichia coli NOT DETECTED NOT DETECTED Final   Klebsiella aerogenes NOT DETECTED NOT DETECTED Final   Klebsiella oxytoca NOT DETECTED NOT DETECTED Final   Klebsiella pneumoniae NOT DETECTED NOT DETECTED Final   Proteus species NOT DETECTED NOT DETECTED Final   Salmonella species NOT DETECTED NOT DETECTED Final   Serratia marcescens NOT DETECTED NOT DETECTED Final   Haemophilus influenzae NOT DETECTED NOT DETECTED Final   Neisseria meningitidis NOT DETECTED NOT DETECTED Final   Pseudomonas aeruginosa NOT DETECTED NOT DETECTED Final   Stenotrophomonas maltophilia NOT DETECTED NOT DETECTED Final   Candida albicans NOT DETECTED NOT DETECTED Final   Candida auris NOT DETECTED NOT DETECTED Final   Candida glabrata NOT DETECTED NOT DETECTED Final   Candida krusei NOT DETECTED NOT DETECTED Final   Candida parapsilosis NOT DETECTED NOT DETECTED Final   Candida tropicalis NOT DETECTED NOT DETECTED Final   Cryptococcus neoformans/gattii NOT DETECTED NOT DETECTED Final    Comment: Performed at [facility], [address] [place], [place] [address]  Culture, blood (single)     Status: Abnormal   Collection  Time: 05/05/21 11:06 AM   Specimen: BLOOD  Result Value Ref Range Status   Specimen Description   Final    BLOOD  LEFT FOREARM Performed at [facility], [address] [place], [place] [address]    Special Requests   Final    BOTTLES DRAWN AEROBIC AND ANAEROBIC Blood Culture results may not be optimal due to an inadequate volume of blood received in culture bottles Performed at [facility], [address] [place], [place] [address]    Culture  Setup Time   Final    GRAM NEGATIVE RODS ANAEROBIC BOTTLE ONLY CRITICAL RESULT CALLED TO, READ BACK BY AND VERIFIED WITH: SUSAN WATSON @ 1617 ON 05/07/2021 BY CAF Performed at [facility], [address] [place], [place] [address]    Culture (A)  Final    BACTEROIDES THETAIOTAOMICRON BETA LACTAMASE POSITIVE Performed at [facility], [address], [place], [place] [address]    Report Status 05/09/2021 FINAL  Final  Expectorated Sputum Assessment w Gram Stain, Rflx to Resp Cult     Status: None   Collection Time: 05/06/21 12:53 PM   Specimen: Sputum  Result Value Ref Range Status   Specimen Description SPUTUM  Final   Special Requests NONE  Final   Sputum evaluation   Final    Sputum specimen not acceptable for testing.  Please recollect.   CALLED TO ZOIA CISNEROS  ON 05/06/21 SKL Performed at [facility], [address] [place], [place] [address]    Report Status 05/06/2021 FINAL  Final  CULTURE, BLOOD (ROUTINE X 2) w Reflex to ID Panel     Status: None (Preliminary result)   Collection Time: 05/08/21  4:51 AM   Specimen: BLOOD  Result Value Ref Range Status   Specimen Description BLOOD BLOOD RIGHT HAND  Final   Special Requests   Final    BOTTLES DRAWN AEROBIC AND ANAEROBIC Blood Culture adequate volume   Culture   Final    NO GROWTH 4 DAYS Performed at [facility] [address] [place], [place] [address]    Report Status PENDING  Incomplete  CULTURE, BLOOD  (ROUTINE X 2) w Reflex to ID Panel     Status: None (Preliminary result)   Collection Time: 05/08/21  4:51 AM   Specimen:  BLOOD  Result Value Ref Range Status   Specimen Description BLOOD RIGHT ANTECUBITAL  Final   Special Requests   Final    BOTTLES DRAWN AEROBIC AND ANAEROBIC Blood Culture adequate volume   Culture   Final    NO GROWTH 4 DAYS Performed at [facility], [address], [place], [place] [address]    Report Status PENDING  Incomplete  Resp Panel by RT-PCR (Flu A&B, Covid) Nasopharyngeal Swab     Status: None   Collection Time: 05/09/21  4:54 PM   Specimen: Nasopharyngeal Swab; Nasopharyngeal(NP) swabs in vial transport medium  Result Value Ref Range Status   SARS Coronavirus 2 by RT PCR NEGATIVE NEGATIVE Final    Comment: (NOTE) SARS-CoV-2 target nucleic acids are NOT DETECTED.  The SARS-CoV-2 RNA is generally detectable in upper respiratory specimens during the acute phase of infection. The lowest concentration of SARS-CoV-2 viral copies this assay can detect is 138 copies/mL. A negative result does not preclude SARS-Cov-2 infection and should not be used as the sole basis for treatment or other patient management decisions. A negative result may occur with  improper specimen collection/handling, submission of specimen other than nasopharyngeal swab, presence of viral mutation(s) within the areas targeted by this assay, and inadequate number of viral copies(<138 copies/mL). A negative result must be combined with clinical observations, patient history, and epidemiological information. The expected result is Negative.  Fact Sheet for Patients:  BloggerCourse.com  Fact Sheet for Healthcare Providers:  SeriousBroker.it  This test is no t yet approved or cleared by the [place] FDA and  has been authorized for detection and/or diagnosis of SARS-CoV-2 by FDA under an Emergency Use Authorization (EUA).  This EUA will remain  in effect (meaning this test can be used) for the duration of the COVID-19 declaration under Section 564(b)(1) of the Act, 21 U.S.C.section 360bbb-3(b)(1), unless the authorization is terminated  or revoked sooner.       Influenza A by PCR NEGATIVE NEGATIVE Final   Influenza B by PCR NEGATIVE NEGATIVE Final    Comment: (NOTE) The Xpert Xpress SARS-CoV-2/FLU/RSV plus assay is intended as an aid in the diagnosis of influenza from Nasopharyngeal swab specimens and should not be used as a sole basis for treatment. Nasal washings and aspirates are unacceptable for Xpert Xpress SARS-CoV-2/FLU/RSV testing.  Fact Sheet for Patients: BloggerCourse.com  Fact Sheet for Healthcare Providers: SeriousBroker.it  This test is not yet approved or cleared by the [place] FDA and has been authorized for detection and/or diagnosis of SARS-CoV-2 by FDA under an Emergency Use Authorization (EUA). This EUA will remain in effect (meaning this test can be used) for the duration of the COVID-19 declaration under Section 564(b)(1) of the Act, 21 U.S.C. section 360bbb-3(b)(1), unless the authorization is terminated or revoked.  Performed at [facility], [address] [place], [place] [address]          Radiology Studies: US SCROTUM W/DOPPLER  Result Date: 05/11/2021 CLINICAL DATA:  70 year old male with testicular swelling for the past 4 days EXAM: SCROTAL ULTRASOUND DOPPLER ULTRASOUND OF THE TESTICLES TECHNIQUE: Complete ultrasound examination of the testicles, epididymis, and other scrotal structures was performed. Color and spectral Doppler ultrasound were also utilized to evaluate blood flow to the testicles. COMPARISON:  None. FINDINGS: Right testicle Measurements: 4.6 x 2.8 x 2.6 cm. No mass or microlithiasis visualized. Left testicle Measurements: 4.5 x 2.5 x 3.0 cm. No mass or microlithiasis visualized.  Right epididymis:  Normal in size and  appearance. Left epididymis:  Normal in size and appearance. Hydrocele:  None visualized. Varicocele:  None visualized. Pulsed Doppler interrogation of both testes demonstrates normal low resistance arterial and venous waveforms bilaterally. IMPRESSION: 1. Extensive thickening of the scrotum measuring up to 1.8 cm. Differential considerations include scrotal edema versus cellulitis. 2. Normal sonographic appearance of the testes without evidence of torsion. Electronically Signed   By: Malachy Moan M.D.   On: 05/11/2021 12:04        Scheduled Meds: . apixaban  5 mg Oral BID  . famotidine  20 mg Oral BID  . furosemide  40 mg Intravenous Daily  . ketorolac  15 mg Intravenous Q6H  . lisinopril  10 mg Oral Daily  . metoprolol succinate  50 mg Oral Daily  . mometasone-formoterol  2 puff Inhalation BID  . omega-3 acid ethyl esters  1 g Oral Daily  . polyethylene glycol  17 g Oral Daily  . rosuvastatin  20 mg Oral Daily  . senna-docusate  1 tablet Oral BID  . tiotropium  18 mcg Inhalation Daily   Continuous Infusions: . sodium chloride Stopped (05/09/21 1249)  . ampicillin-sulbactam (UNASYN) IV 3 g (05/12/21 1319)     LOS: 7 days    Time spent: 15 minutes    Tresa Moore, MD [facility] Pager [phone]  If 7PM-7AM, please contact night-coverage 05/12/2021, 2:02 PM

## 2021-05-12 NOTE — TOC Progression Note (Signed)
Transition of Care [facility]) - Progression Note    Patient Details  Name: Thomas Tanner MRN: [id number] Date of Birth: 28-Oct-1951  [facility]) CM/SW Contact  Hetty Ely, RN Phone Number: 05/12/2021, 3:03 PM  Clinical Narrative:  Attending says patient is not medically stable for discharge today, may be in 48hrs. Tresa Endo at [facility] notified, says bed may not be available.     Expected Discharge Plan: Skilled Nursing Facility Barriers to Discharge: Continued Medical Work up  Expected Discharge Plan and Services Expected Discharge Plan: Skilled Nursing Facility     Post Acute Care Choice: Skilled Nursing Facility Living arrangements for the past 2 months: Single Family Home                                       Social Determinants of Health (SDOH) Interventions    Readmission Risk Interventions No flowsheet data found.

## 2021-05-12 NOTE — Evaluation (Signed)
Occupational Therapy Evaluation Patient Details Name: Thomas Tanner MRN: [id number] DOB: 07-04-1951 Today's Date: 05/12/2021    History of Present Illness Thomas Tanner is a 70 y.o. male with history of CHF, COPD on 3-1/2 L home oxygen, tobacco use disorder, recent saddle pulmonary embolism in August 2021, CAD, hypertension, chronic right sided low back pain with sciatica, who presents for back and chest pain.   Clinical Impression   Thomas Tanner was seen for OT evaluation this date. Prior to hospital admission, pt was MOD I for mobility and ADLs using RW as needed and assist for bathing. Pt lives in 1 level home c 6 STE. Pt presents to acute OT demonstrating impaired ADL performance and functional mobility 2/2 increased edema, functional ROM/balnace deficits, and poor activity tolerance. Upon arrival NT at bedside, pt found to have BM in bed. Pt currently requires MAX A x2 for perihygiene at bedlevel - assist for rolling and maintaining LLE in air to prevent squishing swollen scrotum. MAX A for LBD at bed level. SETUP bed level UB ADLs. Pt instructed on scrotal edema mgmt (approx mango sized) and left elevated on towels, NT and RN aware. Pt would benefit from skilled OT to address noted impairments and functional limitations (see below for any additional details) in order to maximize safety and independence while minimizing falls risk and caregiver burden. Upon hospital discharge, recommend STR to maximize pt safety and return to PLOF.     Follow Up Recommendations  SNF    Equipment Recommendations  Other (comment) (TBD at next venue of care)    Recommendations for Other Services       Precautions / Restrictions Precautions Precautions: Fall Restrictions Weight Bearing Restrictions: No      Mobility Bed Mobility Overal bed mobility: Needs Assistance Bed Mobility: Rolling Rolling: Max assist              Transfers                 General transfer  comment: deferred 2/2 scrotal pain    Balance                                           ADL either performed or assessed with clinical judgement   ADL Overall ADL's : Needs assistance/impaired                                       General ADL Comments: MAX A x2 for perihygiene at bedlevel - assist for rolling and maintaining LLE in air to prevent squishing swollen scrotum. MAX A for LBD at bed level. SETUP bed level UB ADLs.                  Pertinent Vitals/Pain Pain Assessment: Faces Faces Pain Scale: Hurts even more Pain Location: scrotum with perihygiene Pain Descriptors / Indicators: Grimacing;Discomfort;Moaning Pain Intervention(s): Limited activity within patient's tolerance;Repositioned     Hand Dominance Right   Extremity/Trunk Assessment Upper Extremity Assessment Upper Extremity Assessment: Overall WFL for tasks assessed   Lower Extremity Assessment Lower Extremity Assessment: Generalized weakness       Communication Communication Communication: No difficulties   Cognition Arousal/Alertness: Awake/alert Behavior During Therapy: WFL for tasks assessed/performed Overall Cognitive Status: Within Functional Limits for tasks assessed  General Comments  scrotum noted to bemango sized, no weeping, dry/hard skin    Exercises Exercises: Other exercises Other Exercises Other Exercises: Pt educated re: OT role, DME recs, d/c recs, falls prevention, edema mgmt Other Exercises: LBD, toileting, rolling, sidelying   Shoulder Instructions      Home Living Family/patient expects to be discharged to:: Private residence Living Arrangements: Spouse/significant other Available Help at Discharge: Family;Available 24 hours/day Type of Home: House Home Access: Stairs to enter [employer] of Steps: 6 Entrance Stairs-Rails: Can reach both Home Layout: One level      Bathroom Shower/Tub: Chief Strategy Officer: Standard     Home Equipment: Environmental consultant - 4 wheels;Shower seat;Grab bars - tub/shower;Bedside commode;Cane - single point          Prior Functioning/Environment Level of Independence: Needs assistance  Gait / Transfers Assistance Needed: Pt reports using lift chair for sit<>stand transfer. Reports only using RW if needed. ADL's / Homemaking Assistance Needed: Pt requires assistance for bathing but is independent with dressing.            OT Problem List: Decreased strength;Decreased range of motion;Decreased activity tolerance;Impaired balance (sitting and/or standing);Decreased safety awareness;Increased edema      OT Treatment/Interventions: Self-care/ADL training;Therapeutic exercise;Energy conservation;DME and/or AE instruction;Therapeutic activities;Patient/family education;Balance training    OT Goals(Current goals can be found in the care plan section) Acute Rehab OT Goals Patient Stated Goal: to get back to walking OT Goal Formulation: With patient Time For Goal Achievement: 05/26/21 Potential to Achieve Goals: Good ADL Goals Pt Will Perform Grooming: with supervision;sitting Pt Will Perform Lower Body Dressing: with mod assist;sitting/lateral leans Pt Will Transfer to Toilet: with max assist;stand pivot transfer;bedside commode (c LRAD PRN)  OT Frequency: Min 1X/week   Barriers to D/C: Decreased caregiver support             AM-PAC OT "6 Clicks" Daily Activity     Outcome Measure Help from another person eating meals?: None Help from another person taking care of personal grooming?: A Little Help from another person toileting, which includes using toliet, bedpan, or urinal?: A Lot Help from another person bathing (including washing, rinsing, drying)?: A Lot Help from another person to put on and taking off regular upper body clothing?: A Little Help from another person to put on and taking off regular  lower body clothing?: A Lot 6 Click Score: 16   End of Session    Activity Tolerance: Patient tolerated treatment well Patient left: in bed;with call bell/phone within reach;with bed alarm set  OT Visit Diagnosis: Other abnormalities of gait and mobility (R26.89)                Time: 1610-9604 OT Time Calculation (min): 24 min Charges:  OT General Charges $OT Visit: 1 Visit OT Evaluation $OT Eval Low Complexity: 1 Low OT Treatments $Self Care/Home Management : 8-22 mins   Kathie Dike, M.S. OTR/L  05/12/21, 3:48 PM  ascom [phone]

## 2021-05-13 LAB — CULTURE, BLOOD (ROUTINE X 2)
Culture: NO GROWTH
Culture: NO GROWTH
Special Requests: ADEQUATE
Special Requests: ADEQUATE

## 2021-05-13 NOTE — Plan of Care (Signed)

## 2021-05-13 NOTE — Progress Notes (Signed)
Physical Therapy Treatment Patient Details Name: Thomas Tanner MRN: [id number] DOB: [birth year] Today's Date: 05/13/2021    History of Present Illness Thomas Tanner is a 70 y.o. male with history of CHF, COPD on 3-1/2 L home oxygen, tobacco use disorder, recent saddle pulmonary embolism in August 2021, CAD, hypertension, chronic right sided low back pain with sciatica, who presents for back and chest pain.    PT Comments    Pt seen for PT treatment with pt declining OOB activity at this time 2/2 scrotal edema & pain but willing to perform BLE strengthening exercises. Pt performs exercises as noted below with PT providing instructional cuing for technique although pt would benefit from ongoing education. PT encouraged pt to reposition in bed to prevent skin breakdown with pt reporting he does it. Will continue to follow pt acutely to progress OOB mobility as able.    Follow Up Recommendations  SNF     Equipment Recommendations  Rolling walker with 5" wheels    Recommendations for Other Services       Precautions / Restrictions Precautions Precautions: Fall Restrictions Weight Bearing Restrictions: No    Mobility  Bed Mobility                    Transfers                    Ambulation/Gait                 Stairs             Wheelchair Mobility    Modified Rankin (Stroke Patients Only)       Balance                                            Cognition Arousal/Alertness: Awake/alert Behavior During Therapy: WFL for tasks assessed/performed Overall Cognitive Status: Within Functional Limits for tasks assessed                                 General Comments: Pt is A and O x 4      Exercises General Exercises - Lower Extremity Ankle Circles/Pumps: AROM;Both;15 reps;Supine Heel Slides: AROM;Strengthening;Both;15 reps;Supine Hip ABduction/ADduction: AROM;Strengthening;Both;15  reps;Supine Straight Leg Raises: AROM;Strengthening;Both;15 reps;Supine    General Comments        Pertinent Vitals/Pain Pain Assessment: Faces Pain Location: scrotum Pain Descriptors / Indicators: Grimacing;Discomfort Pain Intervention(s): Limited activity within patient's tolerance    Home Living                      Prior Function            PT Goals (current goals can now be found in the care plan section) Acute Rehab PT Goals Patient Stated Goal: to get back to walking PT Goal Formulation: With patient Time For Goal Achievement: 05/20/21 Potential to Achieve Goals: Fair Progress towards PT goals: Progressing toward goals    Frequency    Min 2X/week      PT Plan Current plan remains appropriate    Co-evaluation              AM-PAC PT "6 Clicks" Mobility   Outcome Measure  Help needed turning from your back to your side while in a flat bed without using  bedrails?: A Little Help needed moving from lying on your back to sitting on the side of a flat bed without using bedrails?: A Little Help needed moving to and from a bed to a chair (including a wheelchair)?: A Lot Help needed standing up from a chair using your arms (e.g., wheelchair or bedside chair)?: A Lot Help needed to walk in hospital room?: A Lot Help needed climbing 3-5 steps with a railing? : A Lot 6 Click Score: 14    End of Session Equipment Utilized During Treatment: Oxygen Activity Tolerance: Patient tolerated treatment well Patient left: in bed;with bed alarm set;with call bell/phone within reach   PT Visit Diagnosis: Unsteadiness on feet (R26.81);Muscle weakness (generalized) (M62.81);Difficulty in walking, not elsewhere classified (R26.2)     Time: 1660-6301 PT Time Calculation (min) (ACUTE ONLY): 9 min  Charges:  $Therapeutic Exercise: 8-22 mins                     Aleda Grana, PT, DPT 05/13/21, 11:22 AM    Sandi Mariscal 05/13/2021, 11:21 AM

## 2021-05-13 NOTE — Progress Notes (Signed)
PROGRESS NOTE    Thomas Tanner  XBJ:[id number] DOB: [birth year] DOA: 05/05/2021 PCP: Theotis Burrow, NP (Inactive)  Brief Narrative:  70 y.o.malewith history of CHF, COPD on 3.5 L home oxygen, tobacco use disorder, recent saddle pulmonary embolism in August 2021, CAD, hypertension, chronic right sided low back pain with sciatica, who presented for back, CP, and rectal pain.  Patient reports the primary reason for presentation today was due to chest pains. He reports that were sharp and very short in duration, started in the morning, located centrally in his chest, not associated with nausea or sweating. He did not think that he felt like heart attack pains but he wanted to come and be evaluated anyways.  He hasalso been in significant distress because of constipation and rectal pain. Reports that starting last week and he is extremely constipated, passed a very large caliber stool which he felt caused trauma. Subsequently he is continue to be constipated and had a very painful rectum.  Was found to have significant constipation without other obvious cause for initial presentation.  Also discovered to have Bacteroides bacteremia, likely etiology translocation secondary to underlying severe constipation and stool burden.  6/2: Constipation resolved.  Patient endorsing significant scrotal pain this morning.  Upon examination patient has a markedly enlarged and edematous scrotum.  See below for plan  6/3: Scrotal ultrasound with marked scrotal edema negative for torsion.  Patient was still with significant scrotal edema. 6/4: Patient diuresing.  Scrotal exam slowly improving   Assessment & Plan:   Principal Problem:   Constipated Active Problems:   Chronic right-sided low back pain with right-sided sciatica   Hypertension   CAD in native artery   Morbid obesity with BMI of 50.0-59.9, adult (HCC)   Cigarette smoker motivated to quit   COPD with acute exacerbation (HCC)    Physical deconditioning   Bacteremia due to Gram-negative bacteria  Scrotal swelling Associated with pain Noted on physical exam Scrotal ultrasound negative for torsion, demonstrate significant scrotal wall edema Relatively low suspicion for cellulitis Procalcitonin inconclusive Plan: Continue 40 mg IV Lasix daily Continue Unasyn Scrotal elevation Reassess in a.m.  Bacteroides bacteremia -Possible translocation from his underlying severe constipation/stool burden.  Admission blood cultures now showing 2/4 bottles (anaerobic bottle from each set) positive with GNR (speciated to Bacteroides) from 05/05/21.  -DC Rocephin on 5/31 -  Augmentin discontinued due to lack of appropriate anaerobic coverage and question regarding penetration given body habitus.  After discussion with pharmacy will give Unasyn while in-house and consider transition to oral agent at time of discharge  Chest pain - resolved  COPD, possible exacerbation Patient's chest pain history not suggestive of cardiac ischemia. EKG is unchanged from prior without any ischemic changes. Troponin is flat and likely demand related -Completed 5-day course of azithromycin and prednisone - Continue chronic oxygen, currently on home setting of 3.5 L -Start on [place] - Outpatient referral to pulmonology  Constipation Rectal pain Patient has been straining and had large bowel movement.  Rectal pain likely from ongoing stool burden - Continue laxative regimen - Continue Preparation H as needed -Multiple BMs over interval -Stable for discharge from the standpoint however needs further inpatient management including IV diuresis for scrotal swelling.  Anticipated date of discharge 05/15/2019  Physical deconditioning - Patient less ambulatory than usual and endorses more difficulty with ambulating recently.  We discussed having PT evaluate and if requiring rehab, he is interested otherwise plan would be to discharge  home -- SNF  recommended; per [facility] plan to discharge to [facility] health care.  At this point not stable for discharge.  Anticipated date of discharge 05/14/2021  Lactic acidosis - resolved  Initial elevations appeared to be possibly reactive; low suspicion for infection or severe hypoperfusion  Bilateral renal masses Review of prior imaging shows that bilateral masses were present in 2014 and have both grown approximately 5 cm larger since then. - outpatient follow-up with urology.  History of PE-continue apixaban GERD-continue famotidine Chronic dCHF-continue Lasix, metoprolol; no s/s exacerbation  Hypertension-continue lisinopril Hyperlipidemia-continue rosuvastatin   DVT prophylaxis: Apixaban Code Status: Full Family Communication: None today.  Offered to call but patient declined Disposition Plan: Status is: Inpatient  Remains inpatient appropriate because:Inpatient level of care appropriate due to severity of illness   Dispo: The patient is from: Home              Anticipated d/c is to: SNF              Patient currently is not medically stable to d/c.   Difficult to place patient No  Scrotal edema.  On IV Lasix.  Possible discharge 05/14/2021     Level of care: Med-Surg  Consultants:   None  Procedures:   None  Antimicrobials:   None   Subjective: Patient seen and examined.  Pain in scrotum has improved.  No other complaints  Objective: Vitals:   05/12/21 2137 05/12/21 2350 05/13/21 0530 05/13/21 1112  BP: 121/64 99/71 (!) 118/57 (!) 142/66  Pulse: 90 95 91 86  Resp: 16 20 20 16   Temp: 98.9 F (37.2 C) 98.7 F (37.1 C) 98.4 F (36.9 C) 97.8 F (36.6 C)  TempSrc:  Oral  Oral  SpO2: 99% 100% 95% 94%  Weight:      Height:        Intake/Output Summary (Last 24 hours) at 05/13/2021 1229 Last data filed at 05/13/2021 0900 Gross per 24 hour  Intake 840 ml  Output 1300 ml  Net -460 ml   Filed Weights   05/05/21 0846 05/11/21 0724  Weight: (!)  190.5 kg (!) 189.5 kg    Examination:  General exam: Appears calm and comfortable  Respiratory system: Clear to auscultation. Respiratory effort normal. Cardiovascular system: S1 & S2 heard, RRR. No JVD, murmurs, rubs, gallops or clicks. No pedal edema. Gastrointestinal system: Obese, nontender, nondistended, normal bowel sounds Genitourinary: Edematous.  Improving over interval Central nervous system: Alert and oriented. No focal neurological deficits. Extremities: Symmetric 5 x 5 power. Skin: No rashes, lesions or ulcers Psychiatry: Judgement and insight appear normal. Mood & affect appropriate.     Data Reviewed: I have personally reviewed following labs and imaging studies  CBC: Recent Labs  Lab 05/08/21 0451 05/09/21 0630 05/10/21 0518 05/11/21 0420 05/12/21 0509  WBC 14.4* 15.4* 21.0* 19.4* 17.6*  NEUTROABS 11.7* 12.5* 18.3* 16.3* 14.7*  HGB 13.6 13.7 14.0 13.4 12.9*  HCT 38.1* 37.4* 41.3 39.2 37.2*  MCV 92.9 94.4 91.8 90.3 92.1  PLT 271 277 303 315 324   Basic Metabolic Panel: Recent Labs  Lab 05/08/21 0451 05/09/21 0630 05/10/21 0518 05/11/21 0420 05/12/21 0509  NA 135 136 132* 133* 133*  K 4.1 3.8 4.0 3.9 4.1  CL 97* 94* 93* 93* 91*  CO2 28 31 29 29 29   GLUCOSE 121* 118* 154* 124* 99  BUN 21 17 19 21  27*  CREATININE 0.85 0.91 0.98 1.08 1.31*  CALCIUM 8.8* 9.2 8.9 8.7* 8.6*  MG 2.1 1.9 2.2  2.3 2.5*   GFR: Estimated Creatinine Clearance: 94.4 mL/min (A) (by C-G formula based on SCr of 1.31 mg/dL (H)). Liver Function Tests: No results for input(s): AST, ALT, ALKPHOS, BILITOT, PROT, ALBUMIN in the last 168 hours. No results for input(s): LIPASE, AMYLASE in the last 168 hours. No results for input(s): AMMONIA in the last 168 hours. Coagulation Profile: No results for input(s): INR, PROTIME in the last 168 hours. Cardiac Enzymes: No results for input(s): CKTOTAL, CKMB, CKMBINDEX, TROPONINI in the last 168 hours. BNP (last 3 results) No results for  input(s): PROBNP in the last 8760 hours. HbA1C: No results for input(s): HGBA1C in the last 72 hours. CBG: No results for input(s): GLUCAP in the last 168 hours. Lipid Profile: No results for input(s): CHOL, HDL, LDLCALC, TRIG, CHOLHDL, LDLDIRECT in the last 72 hours. Thyroid Function Tests: No results for input(s): TSH, T4TOTAL, FREET4, T3FREE, THYROIDAB in the last 72 hours. Anemia Panel: No results for input(s): VITAMINB12, FOLATE, FERRITIN, TIBC, IRON, RETICCTPCT in the last 72 hours. Sepsis Labs: Recent Labs  Lab 05/11/21 0420  PROCALCITON 0.51    Recent Results (from the past 240 hour(s))  Resp Panel by RT-PCR (Flu A&B, Covid) Nasopharyngeal Swab     Status: None   Collection Time: 05/05/21  9:32 AM   Specimen: Nasopharyngeal Swab; Nasopharyngeal(NP) swabs in vial transport medium  Result Value Ref Range Status   SARS Coronavirus 2 by RT PCR NEGATIVE NEGATIVE Final    Comment: (NOTE) SARS-CoV-2 target nucleic acids are NOT DETECTED.  The SARS-CoV-2 RNA is generally detectable in upper respiratory specimens during the acute phase of infection. The lowest concentration of SARS-CoV-2 viral copies this assay can detect is 138 copies/mL. A negative result does not preclude SARS-Cov-2 infection and should not be used as the sole basis for treatment or other patient management decisions. A negative result may occur with  improper specimen collection/handling, submission of specimen other than nasopharyngeal swab, presence of viral mutation(s) within the areas targeted by this assay, and inadequate number of viral copies(<138 copies/mL). A negative result must be combined with clinical observations, patient history, and epidemiological information. The expected result is Negative.  Fact Sheet for Patients:  BloggerCourse.com  Fact Sheet for Healthcare Providers:  SeriousBroker.it  This test is no t yet approved or cleared by  the [place] FDA and  has been authorized for detection and/or diagnosis of SARS-CoV-2 by FDA under an Emergency Use Authorization (EUA). This EUA will remain  in effect (meaning this test can be used) for the duration of the COVID-19 declaration under Section 564(b)(1) of the Act, 21 U.S.C.section 360bbb-3(b)(1), unless the authorization is terminated  or revoked sooner.       Influenza A by PCR NEGATIVE NEGATIVE Final   Influenza B by PCR NEGATIVE NEGATIVE Final    Comment: (NOTE) The Xpert Xpress SARS-CoV-2/FLU/RSV plus assay is intended as an aid in the diagnosis of influenza from Nasopharyngeal swab specimens and should not be used as a sole basis for treatment. Nasal washings and aspirates are unacceptable for Xpert Xpress SARS-CoV-2/FLU/RSV testing.  Fact Sheet for Patients: BloggerCourse.com  Fact Sheet for Healthcare Providers: SeriousBroker.it  This test is not yet approved or cleared by the [place] FDA and has been authorized for detection and/or diagnosis of SARS-CoV-2 by FDA under an Emergency Use Authorization (EUA). This EUA will remain in effect (meaning this test can be used) for the duration of the COVID-19 declaration under Section 564(b)(1) of the Act, 21 U.S.C. section  360bbb-3(b)(1), unless the authorization is terminated or revoked.  Performed at [facility], [address] [place], [place] [address]   Blood culture (routine single)     Status: Abnormal   Collection Time: 05/05/21  9:32 AM   Specimen: BLOOD  Result Value Ref Range Status   Specimen Description   Final    BLOOD LEFT ANTECUBITAL Performed at [facility], [address] [place], [place] [address]    Special Requests   Final    BOTTLES DRAWN AEROBIC AND ANAEROBIC Blood Culture results may not be optimal due to an inadequate volume of blood received in culture bottles Performed at [facility],  [address], [place], [place] [address]    Culture  Setup Time   Final    GRAM NEGATIVE RODS ANAEROBIC BOTTLE ONLY CRITICAL RESULT CALLED TO, READ BACK BY AND VERIFIED WITH: SUSAN WATSON ON 05/06/21 AT 1918 QSD    Culture (A)  Final    ANAEROBIC GRAM NEGATIVE ROD UNABLE TO FURTHER ID Performed at [facility], [address] [place], [place] [address]    Report Status 05/12/2021 FINAL  Final  Urine culture     Status: Abnormal   Collection Time: 05/05/21  9:32 AM   Specimen: In/Out Cath Urine  Result Value Ref Range Status   Specimen Description   Final    IN/OUT CATH URINE Performed at [facility], [address] [place], [place] [address]    Special Requests   Final    NONE Performed at [facility], [address] [place], [place] [address]    Culture (A)  Final    80,000 COLONIES/mL STAPHYLOCOCCUS EPIDERMIDIS >=100,000 COLONIES/mL AEROCOCCUS URINAE Standardized susceptibility testing for this organism is not available. Performed at [facility], [address], [place], [place] [address]    Report Status 05/08/2021 FINAL  Final   Organism ID, Bacteria STAPHYLOCOCCUS EPIDERMIDIS (A)  Final      Susceptibility   Staphylococcus epidermidis - MIC*    CIPROFLOXACIN <=0.5 SENSITIVE Sensitive     GENTAMICIN <=0.5 SENSITIVE Sensitive     NITROFURANTOIN <=16 SENSITIVE Sensitive     OXACILLIN >=4 RESISTANT Resistant     TETRACYCLINE <=1 SENSITIVE Sensitive     VANCOMYCIN 1 SENSITIVE Sensitive     TRIMETH/SULFA <=10 SENSITIVE Sensitive     CLINDAMYCIN <=0.25 SENSITIVE Sensitive     RIFAMPIN <=0.5 SENSITIVE Sensitive     Inducible Clindamycin NEGATIVE Sensitive     * 80,000 COLONIES/mL STAPHYLOCOCCUS EPIDERMIDIS  Blood Culture ID Panel (Reflexed)     Status: None   Collection Time: 05/05/21  9:32 AM  Result Value Ref Range Status   Enterococcus faecalis NOT DETECTED NOT DETECTED Final   Enterococcus Faecium NOT DETECTED NOT DETECTED  Final   Listeria monocytogenes NOT DETECTED NOT DETECTED Final   Staphylococcus species NOT DETECTED NOT DETECTED Final   Staphylococcus aureus (BCID) NOT DETECTED NOT DETECTED Final   Staphylococcus epidermidis NOT DETECTED NOT DETECTED Final   Staphylococcus lugdunensis NOT DETECTED NOT DETECTED Final   Streptococcus species NOT DETECTED NOT DETECTED Final   Streptococcus agalactiae NOT DETECTED NOT DETECTED Final   Streptococcus pneumoniae NOT DETECTED NOT DETECTED Final   Streptococcus pyogenes NOT DETECTED NOT DETECTED Final   A.calcoaceticus-baumannii NOT DETECTED NOT DETECTED Final   Bacteroides fragilis NOT DETECTED NOT DETECTED Final   Enterobacterales NOT DETECTED NOT DETECTED Final   Enterobacter cloacae complex NOT DETECTED NOT DETECTED Final  Escherichia coli NOT DETECTED NOT DETECTED Final   Klebsiella aerogenes NOT DETECTED NOT DETECTED Final   Klebsiella oxytoca NOT DETECTED NOT DETECTED Final   Klebsiella pneumoniae NOT DETECTED NOT DETECTED Final   Proteus species NOT DETECTED NOT DETECTED Final   Salmonella species NOT DETECTED NOT DETECTED Final   Serratia marcescens NOT DETECTED NOT DETECTED Final   Haemophilus influenzae NOT DETECTED NOT DETECTED Final   Neisseria meningitidis NOT DETECTED NOT DETECTED Final   Pseudomonas aeruginosa NOT DETECTED NOT DETECTED Final   Stenotrophomonas maltophilia NOT DETECTED NOT DETECTED Final   Candida albicans NOT DETECTED NOT DETECTED Final   Candida auris NOT DETECTED NOT DETECTED Final   Candida glabrata NOT DETECTED NOT DETECTED Final   Candida krusei NOT DETECTED NOT DETECTED Final   Candida parapsilosis NOT DETECTED NOT DETECTED Final   Candida tropicalis NOT DETECTED NOT DETECTED Final   Cryptococcus neoformans/gattii NOT DETECTED NOT DETECTED Final    Comment: Performed at [facility], [address] [place], [place] [address]  Culture, blood (single)     Status: Abnormal   Collection Time: 05/05/21 11:06  AM   Specimen: BLOOD  Result Value Ref Range Status   Specimen Description   Final    BLOOD  LEFT FOREARM Performed at [facility], [address] [place], [place] [address]    Special Requests   Final    BOTTLES DRAWN AEROBIC AND ANAEROBIC Blood Culture results may not be optimal due to an inadequate volume of blood received in culture bottles Performed at [facility], [address] [place], [place] [address]    Culture  Setup Time   Final    GRAM NEGATIVE RODS ANAEROBIC BOTTLE ONLY CRITICAL RESULT CALLED TO, READ BACK BY AND VERIFIED WITH: SUSAN WATSON @ 1617 ON 05/07/2021 BY CAF Performed at [facility], [address] [place], [place] [address]    Culture (A)  Final    BACTEROIDES THETAIOTAOMICRON BETA LACTAMASE POSITIVE Performed at [facility], [address] [place], [place] [address]    Report Status 05/09/2021 FINAL  Final  Expectorated Sputum Assessment w Gram Stain, Rflx to Resp Cult     Status: None   Collection Time: 05/06/21 12:53 PM   Specimen: Sputum  Result Value Ref Range Status   Specimen Description SPUTUM  Final   Special Requests NONE  Final   Sputum evaluation   Final    Sputum specimen not acceptable for testing.  Please recollect.   CALLED TO ZOIA CISNEROS @1503  ON 05/06/21 SKL Performed at [facility], [address] [place], [place] [address]    Report Status 05/06/2021 FINAL  Final  CULTURE, BLOOD (ROUTINE X 2) w Reflex to ID Panel     Status: None   Collection Time: 05/08/21  4:51 AM   Specimen: BLOOD  Result Value Ref Range Status   Specimen Description BLOOD BLOOD RIGHT HAND  Final   Special Requests   Final    BOTTLES DRAWN AEROBIC AND ANAEROBIC Blood Culture adequate volume   Culture   Final    NO GROWTH 5 DAYS Performed at [facility], [address] [place], [place] [address]    Report Status 05/13/2021 FINAL  Final  CULTURE, BLOOD (ROUTINE X 2) w Reflex to ID Panel      Status: None   Collection Time: 05/08/21  4:51 AM   Specimen: BLOOD  Result Value Ref Range Status   Specimen Description BLOOD RIGHT  ANTECUBITAL  Final   Special Requests   Final    BOTTLES DRAWN AEROBIC AND ANAEROBIC Blood Culture adequate volume   Culture   Final    NO GROWTH 5 DAYS Performed at [facility], [address] [place], [place] [address]    Report Status 05/13/2021 FINAL  Final  Resp Panel by RT-PCR (Flu A&B, Covid) Nasopharyngeal Swab     Status: None   Collection Time: 05/09/21  4:54 PM   Specimen: Nasopharyngeal Swab; Nasopharyngeal(NP) swabs in vial transport medium  Result Value Ref Range Status   SARS Coronavirus 2 by RT PCR NEGATIVE NEGATIVE Final    Comment: (NOTE) SARS-CoV-2 target nucleic acids are NOT DETECTED.  The SARS-CoV-2 RNA is generally detectable in upper respiratory specimens during the acute phase of infection. The lowest concentration of SARS-CoV-2 viral copies this assay can detect is 138 copies/mL. A negative result does not preclude SARS-Cov-2 infection and should not be used as the sole basis for treatment or other patient management decisions. A negative result may occur with  improper specimen collection/handling, submission of specimen other than nasopharyngeal swab, presence of viral mutation(s) within the areas targeted by this assay, and inadequate number of viral copies(<138 copies/mL). A negative result must be combined with clinical observations, patient history, and epidemiological information. The expected result is Negative.  Fact Sheet for Patients:  BloggerCourse.com  Fact Sheet for Healthcare Providers:  SeriousBroker.it  This test is no t yet approved or cleared by the [place] FDA and  has been authorized for detection and/or diagnosis of SARS-CoV-2 by FDA under an Emergency Use Authorization (EUA). This EUA will remain  in effect (meaning this test can  be used) for the duration of the COVID-19 declaration under Section 564(b)(1) of the Act, 21 U.S.C.section 360bbb-3(b)(1), unless the authorization is terminated  or revoked sooner.       Influenza A by PCR NEGATIVE NEGATIVE Final   Influenza B by PCR NEGATIVE NEGATIVE Final    Comment: (NOTE) The Xpert Xpress SARS-CoV-2/FLU/RSV plus assay is intended as an aid in the diagnosis of influenza from Nasopharyngeal swab specimens and should not be used as a sole basis for treatment. Nasal washings and aspirates are unacceptable for Xpert Xpress SARS-CoV-2/FLU/RSV testing.  Fact Sheet for Patients: BloggerCourse.com  Fact Sheet for Healthcare Providers: SeriousBroker.it  This test is not yet approved or cleared by the [place] FDA and has been authorized for detection and/or diagnosis of SARS-CoV-2 by FDA under an Emergency Use Authorization (EUA). This EUA will remain in effect (meaning this test can be used) for the duration of the COVID-19 declaration under Section 564(b)(1) of the Act, 21 U.S.C. section 360bbb-3(b)(1), unless the authorization is terminated or revoked.  Performed at [facility], [address], [place], [place] [address]          Radiology Studies: No results found.      Scheduled Meds: . apixaban  5 mg Oral BID  . famotidine  20 mg Oral BID  . furosemide  40 mg Intravenous Daily  . ketorolac  15 mg Intravenous Q6H  . lisinopril  10 mg Oral Daily  . metoprolol succinate  50 mg Oral Daily  . mometasone-formoterol  2 puff Inhalation BID  . omega-3 acid ethyl esters  1 g Oral Daily  . polyethylene glycol  17 g Oral Daily  . rosuvastatin  20 mg Oral Daily  . senna-docusate  1 tablet Oral BID  . tiotropium  18 mcg Inhalation  Daily   Continuous Infusions: . sodium chloride 250 mL (05/12/21 2346)  . ampicillin-sulbactam (UNASYN) IV 3 g (05/13/21 0551)     LOS: 8 days    Time  spent: 15 minutes    Tresa Moore, MD [facility] Pager [phone]  If 7PM-7AM, please contact night-coverage 05/13/2021, 12:29 PM

## 2021-05-14 LAB — CBC WITH DIFFERENTIAL/PLATELET
Abs Immature Granulocytes: 0.08 10*3/uL — ABNORMAL HIGH (ref 0.00–0.07)
Basophils Absolute: 0 10*3/uL (ref 0.0–0.1)
Basophils Relative: 0 %
Eosinophils Absolute: 0.1 10*3/uL (ref 0.0–0.5)
Eosinophils Relative: 1 %
HCT: 35.2 % — ABNORMAL LOW (ref 39.0–52.0)
Hemoglobin: 12.6 g/dL — ABNORMAL LOW (ref 13.0–17.0)
Immature Granulocytes: 1 %
Lymphocytes Relative: 10 %
Lymphs Abs: 1.2 10*3/uL (ref 0.7–4.0)
MCH: 33.4 pg (ref 26.0–34.0)
MCHC: 35.8 g/dL (ref 30.0–36.0)
MCV: 93.4 fL (ref 80.0–100.0)
Monocytes Absolute: 1.3 10*3/uL — ABNORMAL HIGH (ref 0.1–1.0)
Monocytes Relative: 10 %
Neutro Abs: 9.8 10*3/uL — ABNORMAL HIGH (ref 1.7–7.7)
Neutrophils Relative %: 78 %
Platelets: 343 10*3/uL (ref 150–400)
RBC: 3.77 MIL/uL — ABNORMAL LOW (ref 4.22–5.81)
RDW: 15.1 % (ref 11.5–15.5)
WBC: 12.6 10*3/uL — ABNORMAL HIGH (ref 4.0–10.5)
nRBC: 0 % (ref 0.0–0.2)

## 2021-05-14 LAB — BASIC METABOLIC PANEL
Anion gap: 11 (ref 5–15)
BUN: 20 mg/dL (ref 8–23)
CO2: 31 mmol/L (ref 22–32)
Calcium: 8.6 mg/dL — ABNORMAL LOW (ref 8.9–10.3)
Chloride: 92 mmol/L — ABNORMAL LOW (ref 98–111)
Creatinine, Ser: 1.06 mg/dL (ref 0.61–1.24)
GFR, Estimated: >60 mL/min (ref >60)
Glucose, Bld: 137 mg/dL — ABNORMAL HIGH (ref 70–99)
Potassium: 3.7 mmol/L (ref 3.5–5.1)
Sodium: 134 mmol/L — ABNORMAL LOW (ref 135–145)

## 2021-05-14 NOTE — Plan of Care (Signed)
  Problem: Clinical Measurements: Goal: Ability to maintain clinical measurements within normal limits will improve Outcome: Progressing Goal: Will remain free from infection Outcome: Progressing Goal: Diagnostic test results will improve Outcome: Progressing Goal: Respiratory complications will improve Outcome: Progressing Goal: Cardiovascular complication will be avoided Outcome: Progressing   Problem: Pain Managment: Goal: General experience of comfort will improve Outcome: Progressing   Pt is involved in and agrees with the plan of care. V/S stable. Denies any pain at this time. On chronic oxygen at 3lpm/[place] with sats at 95%. Scrotum still swollen.

## 2021-05-14 NOTE — Progress Notes (Signed)
PROGRESS NOTE    Thomas Tanner  ZOX:[id number] DOB: 09-May-1951 DOA: 05/05/2021 PCP: Theotis Burrow, NP (Inactive)  Brief Narrative:  70 y.o.malewith history of CHF, COPD on 3.5 L home oxygen, tobacco use disorder, recent saddle pulmonary embolism in August 2021, CAD, hypertension, chronic right sided low back pain with sciatica, who presented for back, CP, and rectal pain.  Patient reports the primary reason for presentation today was due to chest pains. He reports that were sharp and very short in duration, started in the morning, located centrally in his chest, not associated with nausea or sweating. He did not think that he felt like heart attack pains but he wanted to come and be evaluated anyways.  He hasalso been in significant distress because of constipation and rectal pain. Reports that starting last week and he is extremely constipated, passed a very large caliber stool which he felt caused trauma. Subsequently he is continue to be constipated and had a very painful rectum.  Was found to have significant constipation without other obvious cause for initial presentation.  Also discovered to have Bacteroides bacteremia, likely etiology translocation secondary to underlying severe constipation and stool burden.  6/2: Constipation resolved.  Patient endorsing significant scrotal pain this morning.  Upon examination patient has a markedly enlarged and edematous scrotum.  See below for plan  6/3: Scrotal ultrasound with marked scrotal edema negative for torsion.  Patient was still with significant scrotal edema. 6/4: Patient diuresing.  Scrotal exam slowly improving   Assessment & Plan:   Principal Problem:   Constipated Active Problems:   Chronic right-sided low back pain with right-sided sciatica   Hypertension   CAD in native artery   Morbid obesity with BMI of 50.0-59.9, adult (HCC)   Cigarette smoker motivated to quit   COPD with acute exacerbation (HCC)    Physical deconditioning   Bacteremia due to Gram-negative bacteria  Scrotal swelling Associated with pain Noted on physical exam Scrotal ultrasound negative for torsion, demonstrate significant scrotal wall edema Relatively low suspicion for cellulitis Procalcitonin inconclusive Improving over interval Plan: Continue 40 mg IV Lasix daily Continue Unasyn Scrotal elevation Reassess in a.m.  Bacteroides bacteremia -Possible translocation from his underlying severe constipation/stool burden.  Admission blood cultures now showing 2/4 bottles (anaerobic bottle from each set) positive with GNR (speciated to Bacteroides) from 05/05/21.  -DC Rocephin on 5/31 -  Augmentin discontinued due to lack of appropriate anaerobic coverage and question regarding penetration given body habitus.  After discussion with pharmacy will give Unasyn while in-house and consider transition to oral agent at time of discharge  Chest pain - resolved  COPD, possible exacerbation Patient's chest pain history not suggestive of cardiac ischemia. EKG is unchanged from prior without any ischemic changes. Troponin is flat and likely demand related -Completed 5-day course of azithromycin and prednisone - Continue chronic oxygen, currently on home setting of 3.5 L -Start on [place] - Outpatient referral to pulmonology  Constipation Rectal pain Patient has been straining and had large bowel movement.  Rectal pain likely from ongoing stool burden - Continue laxative regimen - Continue Preparation H as needed -Multiple BMs over interval -Stable for discharge from the standpoint however needs further inpatient management including IV diuresis for scrotal swelling.  Anticipated date of discharge 05/15/2019  Physical deconditioning - Patient less ambulatory than usual and endorses more difficulty with ambulating recently.  We discussed having PT evaluate and if requiring rehab, he is interested otherwise plan  would be to discharge  home -- SNF recommended; per [facility] plan to [facility] care.  At this point not stable for discharge.  Anticipated date of discharge 05/14/2021  Lactic acidosis - resolved  Initial elevations appeared to be possibly reactive; low suspicion for infection or severe hypoperfusion  Bilateral renal masses Review of prior imaging shows that bilateral masses were present in 2014 and have both grown approximately 5 cm larger since then. - outpatient follow-up with urology.  History of PE-continue apixaban GERD-continue famotidine Chronic dCHF-continue Lasix, metoprolol; no s/s exacerbation  Hypertension-continue lisinopril Hyperlipidemia-continue rosuvastatin   DVT prophylaxis: Apixaban Code Status: Full Family Communication: None today.  Offered to call but patient declined Disposition Plan: Status is: Inpatient  Remains inpatient appropriate because:Inpatient level of care appropriate due to severity of illness   Dispo: The patient is from: Home              Anticipated d/c is to: SNF              Patient currently is not medically stable to d/c.   Difficult to place patient No  Scrotal edema.  Will attempt to optimize patient.  Anticipated date of discharge 05/15/2021     Level of care: Med-Surg  Consultants:   None  Procedures:   None  Antimicrobials:   None   Subjective: Patient seen and examined.  Scrotal pain improved.  No other complaints.  Objective: Vitals:   05/13/21 2028 05/13/21 2343 05/14/21 0503 05/14/21 0808  BP: (!) 119/49 (!) 140/57 (!) 126/54 114/60  Pulse: 93 85 94 79  Resp: (!) Temp: 97.9 F (36.6 C) 98.5 F (36.9 C) (!) 97.5 F (36.4 C) 98.4 F (36.9 C)  TempSrc: Oral Oral Oral Oral  SpO2: 94% 95% 96% 93%  Weight:      Height:        Intake/Output Summary (Last 24 hours) at 05/14/2021 1026 Last data filed at 05/14/2021 1003 Gross per 24 hour  Intake 360 ml  Output 2800 ml  Net -2440  ml   Filed Weights   05/05/21 0846 05/11/21 0724  Weight: (!) 190.5 kg (!) 189.5 kg    Examination:  General exam: Appears calm and comfortable  Respiratory system: Clear to auscultation. Respiratory effort normal. Cardiovascular system: S1 & S2 heard, RRR. No JVD, murmurs, rubs, gallops or clicks. No pedal edema. Gastrointestinal system: Obese, nontender, nondistended, normal bowel sounds Genitourinary: Edematous.  Improving over interval Central nervous system: Alert and oriented. No focal neurological deficits. Extremities: Symmetric 5 x 5 power. Skin: No rashes, lesions or ulcers Psychiatry: Judgement and insight appear normal. Mood & affect appropriate.     Data Reviewed: I have personally reviewed following labs and imaging studies  CBC: Recent Labs  Lab 05/09/21 0630 05/10/21 0518 05/11/21 0420 05/12/21 0509 05/14/21 0752  WBC 15.4* 21.0* 19.4* 17.6* 12.6*  NEUTROABS 12.5* 18.3* 16.3* 14.7* 9.8*  HGB 13.7 14.0 13.4 12.9* 12.6*  HCT 37.4* 41.3 39.2 37.2* 35.2*  MCV 94.4 91.8 90.3 92.1 93.4  PLT 277 303 315 324 343   Basic Metabolic Panel: Recent Labs  Lab 05/08/21 0451 05/09/21 0630 05/10/21 0518 05/11/21 0420 05/12/21 0509 05/14/21 0752  NA 135 136 132* 133* 133* 134*  K 4.1 3.8 4.0 3.9 4.1 3.7  CL 97* 94* 93* 93* 91* 92*  CO2 GLUCOSE 121* 118* 154* 124* 99 137*  BUN 27* 20  CREATININE 0.85  0.91 0.98 1.08 1.31* 1.06  CALCIUM 8.8* 9.2 8.9 8.7* 8.6* 8.6*  MG 2.1 1.9 2.2 2.3 2.5*  --    GFR: Estimated Creatinine Clearance: 116.7 mL/min (by C-G formula based on SCr of 1.06 mg/dL). Liver Function Tests: No results for input(s): AST, ALT, ALKPHOS, BILITOT, PROT, ALBUMIN in the last 168 hours. No results for input(s): LIPASE, AMYLASE in the last 168 hours. No results for input(s): AMMONIA in the last 168 hours. Coagulation Profile: No results for input(s): INR, PROTIME in the last 168 hours. Cardiac Enzymes: No results  for input(s): CKTOTAL, CKMB, CKMBINDEX, TROPONINI in the last 168 hours. BNP (last 3 results) No results for input(s): PROBNP in the last 8760 hours. HbA1C: No results for input(s): HGBA1C in the last 72 hours. CBG: No results for input(s): GLUCAP in the last 168 hours. Lipid Profile: No results for input(s): CHOL, HDL, LDLCALC, TRIG, CHOLHDL, LDLDIRECT in the last 72 hours. Thyroid Function Tests: No results for input(s): TSH, T4TOTAL, FREET4, T3FREE, THYROIDAB in the last 72 hours. Anemia Panel: No results for input(s): VITAMINB12, FOLATE, FERRITIN, TIBC, IRON, RETICCTPCT in the last 72 hours. Sepsis Labs: Recent Labs  Lab 05/11/21 0420  PROCALCITON 0.51    Recent Results (from the past 240 hour(s))  Resp Panel by RT-PCR (Flu A&B, Covid) Nasopharyngeal Swab     Status: None   Collection Time: 05/05/21  9:32 AM   Specimen: Nasopharyngeal Swab; Nasopharyngeal(NP) swabs in vial transport medium  Result Value Ref Range Status   SARS Coronavirus 2 by RT PCR NEGATIVE NEGATIVE Final    Comment: (NOTE) SARS-CoV-2 target nucleic acids are NOT DETECTED.  The SARS-CoV-2 RNA is generally detectable in upper respiratory specimens during the acute phase of infection. The lowest concentration of SARS-CoV-2 viral copies this assay can detect is 138 copies/mL. A negative result does not preclude SARS-Cov-2 infection and should not be used as the sole basis for treatment or other patient management decisions. A negative result may occur with  improper specimen collection/handling, submission of specimen other than nasopharyngeal swab, presence of viral mutation(s) within the areas targeted by this assay, and inadequate number of viral copies(<138 copies/mL). A negative result must be combined with clinical observations, patient history, and epidemiological information. The expected result is Negative.  Fact Sheet for Patients:  BloggerCourse.com  Fact Sheet for  Healthcare Providers:  SeriousBroker.it  This test is no t yet approved or cleared by the [place] FDA and  has been authorized for detection and/or diagnosis of SARS-CoV-2 by FDA under an Emergency Use Authorization (EUA). This EUA will remain  in effect (meaning this test can be used) for the duration of the COVID-19 declaration under Section 564(b)(1) of the Act, 21 U.S.C.section 360bbb-3(b)(1), unless the authorization is terminated  or revoked sooner.       Influenza A by PCR NEGATIVE NEGATIVE Final   Influenza B by PCR NEGATIVE NEGATIVE Final    Comment: (NOTE) The Xpert Xpress SARS-CoV-2/FLU/RSV plus assay is intended as an aid in the diagnosis of influenza from Nasopharyngeal swab specimens and should not be used as a sole basis for treatment. Nasal washings and aspirates are unacceptable for Xpert Xpress SARS-CoV-2/FLU/RSV testing.  Fact Sheet for Patients: BloggerCourse.com  Fact Sheet for Healthcare Providers: SeriousBroker.it  This test is not yet approved or cleared by the [place] FDA and has been authorized for detection and/or diagnosis of SARS-CoV-2 by FDA under an Emergency Use Authorization (EUA). This EUA will remain in effect (meaning this test  can be used) for the duration of the COVID-19 declaration under Section 564(b)(1) of the Act, 21 U.S.C. section 360bbb-3(b)(1), unless the authorization is terminated or revoked.  Performed at [facility], [address] [place], [place] [address]   Blood culture (routine single)     Status: Abnormal   Collection Time: 05/05/21  9:32 AM   Specimen: BLOOD  Result Value Ref Range Status   Specimen Description   Final    BLOOD LEFT ANTECUBITAL Performed at [facility], [address] [place], [place] [address]    Special Requests   Final    BOTTLES DRAWN AEROBIC AND ANAEROBIC Blood Culture results may not  be optimal due to an inadequate volume of blood received in culture bottles Performed at [facility], [address] [place], [place] [address]    Culture  Setup Time   Final    GRAM NEGATIVE RODS ANAEROBIC BOTTLE ONLY CRITICAL RESULT CALLED TO, READ BACK BY AND VERIFIED WITH: SUSAN WATSON ON 05/06/21 AT 1918 QSD    Culture (A)  Final    ANAEROBIC GRAM NEGATIVE ROD UNABLE TO FURTHER ID Performed at [facility], [address] [place], [place] [address]    Report Status 05/12/2021 FINAL  Final  Urine culture     Status: Abnormal   Collection Time: 05/05/21  9:32 AM   Specimen: In/Out Cath Urine  Result Value Ref Range Status   Specimen Description   Final    IN/OUT CATH URINE Performed at [facility], [address] [place], [place] [address]    Special Requests   Final    NONE Performed at [facility], [address] [place], [place] [address]    Culture (A)  Final    80,000 COLONIES/mL STAPHYLOCOCCUS EPIDERMIDIS >=100,000 COLONIES/mL AEROCOCCUS URINAE Standardized susceptibility testing for this organism is not available. Performed at [facility], [address] [place], [place] [address]    Report Status 05/08/2021 FINAL  Final   Organism ID, Bacteria STAPHYLOCOCCUS EPIDERMIDIS (A)  Final      Susceptibility   Staphylococcus epidermidis - MIC*    CIPROFLOXACIN <=0.5 SENSITIVE Sensitive     GENTAMICIN <=0.5 SENSITIVE Sensitive     NITROFURANTOIN <=16 SENSITIVE Sensitive     OXACILLIN >=4 RESISTANT Resistant     TETRACYCLINE <=1 SENSITIVE Sensitive     VANCOMYCIN 1 SENSITIVE Sensitive     TRIMETH/SULFA <=10 SENSITIVE Sensitive     CLINDAMYCIN <=0.25 SENSITIVE Sensitive     RIFAMPIN <=0.5 SENSITIVE Sensitive     Inducible Clindamycin NEGATIVE Sensitive     * 80,000 COLONIES/mL STAPHYLOCOCCUS EPIDERMIDIS  Blood Culture ID Panel (Reflexed)     Status: None   Collection Time: 05/05/21  9:32 AM  Result Value Ref Range  Status   Enterococcus faecalis NOT DETECTED NOT DETECTED Final   Enterococcus Faecium NOT DETECTED NOT DETECTED Final   Listeria monocytogenes NOT DETECTED NOT DETECTED Final   Staphylococcus species NOT DETECTED NOT DETECTED Final   Staphylococcus aureus (BCID) NOT DETECTED NOT DETECTED Final   Staphylococcus epidermidis NOT DETECTED NOT DETECTED Final   Staphylococcus lugdunensis NOT DETECTED NOT DETECTED Final   Streptococcus species NOT DETECTED NOT DETECTED Final   Streptococcus agalactiae NOT DETECTED NOT DETECTED Final   Streptococcus pneumoniae NOT DETECTED NOT DETECTED Final   Streptococcus pyogenes NOT DETECTED NOT DETECTED Final   A.calcoaceticus-baumannii NOT DETECTED NOT DETECTED Final   Bacteroides fragilis NOT DETECTED NOT DETECTED Final  Enterobacterales NOT DETECTED NOT DETECTED Final   Enterobacter cloacae complex NOT DETECTED NOT DETECTED Final   Escherichia coli NOT DETECTED NOT DETECTED Final   Klebsiella aerogenes NOT DETECTED NOT DETECTED Final   Klebsiella oxytoca NOT DETECTED NOT DETECTED Final   Klebsiella pneumoniae NOT DETECTED NOT DETECTED Final   Proteus species NOT DETECTED NOT DETECTED Final   Salmonella species NOT DETECTED NOT DETECTED Final   Serratia marcescens NOT DETECTED NOT DETECTED Final   Haemophilus influenzae NOT DETECTED NOT DETECTED Final   Neisseria meningitidis NOT DETECTED NOT DETECTED Final   Pseudomonas aeruginosa NOT DETECTED NOT DETECTED Final   Stenotrophomonas maltophilia NOT DETECTED NOT DETECTED Final   Candida albicans NOT DETECTED NOT DETECTED Final   Candida auris NOT DETECTED NOT DETECTED Final   Candida glabrata NOT DETECTED NOT DETECTED Final   Candida krusei NOT DETECTED NOT DETECTED Final   Candida parapsilosis NOT DETECTED NOT DETECTED Final   Candida tropicalis NOT DETECTED NOT DETECTED Final   Cryptococcus neoformans/gattii NOT DETECTED NOT DETECTED Final    Comment: Performed at [facility], [address] [place], [place] [address]  Culture, blood (single)     Status: Abnormal   Collection Time: 05/05/21 11:06 AM   Specimen: BLOOD  Result Value Ref Range Status   Specimen Description   Final    BLOOD  LEFT FOREARM Performed at [facility], [address] [place], [place] [address]    Special Requests   Final    BOTTLES DRAWN AEROBIC AND ANAEROBIC Blood Culture results may not be optimal due to an inadequate volume of blood received in culture bottles Performed at [facility], [address] [place], [place] [address]    Culture  Setup Time   Final    GRAM NEGATIVE RODS ANAEROBIC BOTTLE ONLY CRITICAL RESULT CALLED TO, READ BACK BY AND VERIFIED WITH: SUSAN WATSON @ 1617 ON 05/07/2021 BY CAF Performed at [facility], [address] [place], [place] [address]    Culture (A)  Final    BACTEROIDES THETAIOTAOMICRON BETA LACTAMASE POSITIVE Performed at [facility], [address], [place], [place] [address]    Report Status 05/09/2021 FINAL  Final  Expectorated Sputum Assessment w Gram Stain, Rflx to Resp Cult     Status: None   Collection Time: 05/06/21 12:53 PM   Specimen: Sputum  Result Value Ref Range Status   Specimen Description SPUTUM  Final   Special Requests NONE  Final   Sputum evaluation   Final    Sputum specimen not acceptable for testing.  Please recollect.   CALLED TO ZOIA CISNEROS @1503  ON 05/06/21 SKL Performed at [facility], [address] [place], [place]    Report Status 05/06/2021 FINAL  Final  CULTURE, BLOOD (ROUTINE X 2) w Reflex to ID Panel     Status: None   Collection Time: 05/08/21  4:51 AM   Specimen: BLOOD  Result Value Ref Range Status   Specimen Description BLOOD BLOOD RIGHT HAND  Final   Special Requests   Final    BOTTLES DRAWN AEROBIC AND ANAEROBIC Blood Culture adequate volume   Culture   Final    NO GROWTH 5 DAYS Performed at [facility], [address],  [place], [place]    Report Status 05/13/2021 FINAL  Final  CULTURE, BLOOD (ROUTINE X 2) w Reflex to ID Panel     Status: None   Collection Time: 05/08/21  4:51 AM   Specimen: BLOOD  Result Value Ref Range Status   Specimen Description BLOOD RIGHT ANTECUBITAL  Final   Special Requests   Final    BOTTLES DRAWN AEROBIC AND ANAEROBIC Blood Culture adequate volume   Culture   Final    NO GROWTH 5 DAYS Performed at [facility], [address] [place], [place] [address]    Report Status 05/13/2021 FINAL  Final  Resp Panel by RT-PCR (Flu A&B, Covid) Nasopharyngeal Swab     Status: None   Collection Time: 05/09/21  4:54 PM   Specimen: Nasopharyngeal Swab; Nasopharyngeal(NP) swabs in vial transport medium  Result Value Ref Range Status   SARS Coronavirus 2 by RT PCR NEGATIVE NEGATIVE Final    Comment: (NOTE) SARS-CoV-2 target nucleic acids are NOT DETECTED.  The SARS-CoV-2 RNA is generally detectable in upper respiratory specimens during the acute phase of infection. The lowest concentration of SARS-CoV-2 viral copies this assay can detect is 138 copies/mL. A negative result does not preclude SARS-Cov-2 infection and should not be used as the sole basis for treatment or other patient management decisions. A negative result may occur with  improper specimen collection/handling, submission of specimen other than nasopharyngeal swab, presence of viral mutation(s) within the areas targeted by this assay, and inadequate number of viral copies(<138 copies/mL). A negative result must be combined with clinical observations, patient history, and epidemiological information. The expected result is Negative.  Fact Sheet for Patients:  BloggerCourse.com  Fact Sheet for Healthcare Providers:  SeriousBroker.it  This test is no t yet approved or cleared by the [place] FDA and  has been authorized for detection and/or diagnosis  of SARS-CoV-2 by FDA under an Emergency Use Authorization (EUA). This EUA will remain  in effect (meaning this test can be used) for the duration of the COVID-19 declaration under Section 564(b)(1) of the Act, 21 U.S.C.section 360bbb-3(b)(1), unless the authorization is terminated  or revoked sooner.       Influenza A by PCR NEGATIVE NEGATIVE Final   Influenza B by PCR NEGATIVE NEGATIVE Final    Comment: (NOTE) The Xpert Xpress SARS-CoV-2/FLU/RSV plus assay is intended as an aid in the diagnosis of influenza from Nasopharyngeal swab specimens and should not be used as a sole basis for treatment. Nasal washings and aspirates are unacceptable for Xpert Xpress SARS-CoV-2/FLU/RSV testing.  Fact Sheet for Patients: BloggerCourse.com  Fact Sheet for Healthcare Providers: SeriousBroker.it  This test is not yet approved or cleared by the [place] FDA and has been authorized for detection and/or diagnosis of SARS-CoV-2 by FDA under an Emergency Use Authorization (EUA). This EUA will remain in effect (meaning this test can be used) for the duration of the COVID-19 declaration under Section 564(b)(1) of the Act, 21 U.S.C. section 360bbb-3(b)(1), unless the authorization is terminated or revoked.  Performed at [facility], [address], [place], [place] [address]          Radiology Studies: No results found.      Scheduled Meds: . apixaban  5 mg Oral BID  . famotidine  20 mg Oral BID  . furosemide  40 mg Intravenous Daily  . ketorolac  15 mg Intravenous Q6H  . lisinopril  10 mg Oral Daily  . metoprolol succinate  50 mg Oral Daily  . mometasone-formoterol  2 puff Inhalation BID  . omega-3 acid ethyl esters  1 g Oral Daily  . polyethylene glycol  17 g Oral Daily  . rosuvastatin  20  mg Oral Daily  . senna-docusate  1 tablet Oral BID  . tiotropium  18 mcg Inhalation Daily   Continuous Infusions: . sodium  chloride 250 mL (05/12/21 2346)  . ampicillin-sulbactam (UNASYN) IV 3 g (05/14/21 0524)     LOS: 9 days    Time spent: 15 minutes    Tresa Moore, MD [facility] Pager [phone]  If 7PM-7AM, please contact night-coverage 05/14/2021, 10:26 AM

## 2021-05-14 NOTE — TOC Progression Note (Signed)
Transition of Care [facility]) - Progression Note    Patient Details  Name: Vishaal Strollo MRN: [id number] Date of Birth: 09-04-1951  Transition of Care [facility]) CM/SW Contact  Liliana Cline, LCSW Phone Number: 05/14/2021, 10:34 AM  Clinical Narrative:   Per MD, patient should be medically ready for SNF tomorrow. COVID test being ordered. CSW notified Tresa Endo with [employer].   Expected Discharge Plan: Skilled Nursing Facility Barriers to Discharge: Continued Medical Work up  Expected Discharge Plan and Services Expected Discharge Plan: Skilled Nursing Facility     Post Acute Care Choice: Skilled Nursing Facility Living arrangements for the past 2 months: Single Family Home                                       Social Determinants of Health (SDOH) Interventions    Readmission Risk Interventions No flowsheet data found.

## 2021-05-15 LAB — SARS CORONAVIRUS 2 (TAT 6-24 HRS): SARS Coronavirus 2: NEGATIVE

## 2021-05-15 NOTE — Progress Notes (Signed)
PROGRESS NOTE    Thomas Tanner  CZY:[id number] DOB: Sep 27, 1951 DOA: 05/05/2021 PCP: Theotis Burrow, NP (Inactive)  Brief Narrative:  70 y.o.malewith history of CHF, COPD on 3.5 L home oxygen, tobacco use disorder, recent saddle pulmonary embolism in August 2021, CAD, hypertension, chronic right sided low back pain with sciatica, who presented for back, CP, and rectal pain.  Patient reports the primary reason for presentation today was due to chest pains. He reports that were sharp and very short in duration, started in the morning, located centrally in his chest, not associated with nausea or sweating. He did not think that he felt like heart attack pains but he wanted to come and be evaluated anyways.  He hasalso been in significant distress because of constipation and rectal pain. Reports that starting last week and he is extremely constipated, passed a very large caliber stool which he felt caused trauma. Subsequently he is continue to be constipated and had a very painful rectum.  Was found to have significant constipation without other obvious cause for initial presentation.  Also discovered to have Bacteroides bacteremia, likely etiology translocation secondary to underlying severe constipation and stool burden.  6/2: Constipation resolved.  Patient endorsing significant scrotal pain this morning.  Upon examination patient has a markedly enlarged and edematous scrotum.  See below for plan  6/3: Scrotal ultrasound with marked scrotal edema negative for torsion.  Patient was still with significant scrotal edema. 6/4: Patient diuresing.  Scrotal exam slowly improving 6/6: Pain control improved   Assessment & Plan:   Principal Problem:   Constipated Active Problems:   Chronic right-sided low back pain with right-sided sciatica   Hypertension   CAD in native artery   Morbid obesity with BMI of 50.0-59.9, adult (HCC)   Cigarette smoker motivated to quit   COPD  with acute exacerbation (HCC)   Physical deconditioning   Bacteremia due to Gram-negative bacteria  Scrotal swelling Associated with pain Noted on physical exam Scrotal ultrasound negative for torsion, demonstrate significant scrotal wall edema Relatively low suspicion for cellulitis Procalcitonin inconclusive Improving over interval Plan: IV Lasix 40 mg daily, last dose today Transition back to p.o. Lasix from tomorrow per home dose Continue Unasyn, can transition to Augmentin at time of discharge Scrotal elevation Medically stable for discharge when SNF bed is found  Bacteroides bacteremia -Possible translocation from his underlying severe constipation/stool burden.  Admission blood cultures now showing 2/4 bottles (anaerobic bottle from each set) positive with GNR (speciated to Bacteroides) from 05/05/21.  -DC Rocephin on 5/31 -  Augmentin discontinued due to lack of appropriate anaerobic coverage and question regarding penetration given body habitus.  After discussion with pharmacy will give Unasyn while in-house and consider transition to oral agent at time of discharge  Chest pain - resolved  COPD, possible exacerbation Patient's chest pain history not suggestive of cardiac ischemia. EKG is unchanged from prior without any ischemic changes. Troponin is flat and likely demand related -Completed 5-day course of azithromycin and prednisone - Continue chronic oxygen, currently on home setting of 3.5 L -Start on [place] - Outpatient referral to pulmonology  Constipation Rectal pain Patient has been straining and had large bowel movement.  Rectal pain likely from ongoing stool burden - Continue laxative regimen - Continue Preparation H as needed -Multiple BMs over interval -Patient stable for discharge.  Constipation and rectal pain resolved.  Scrotal swelling improving.  Physical deconditioning - Patient less ambulatory than usual and endorses more difficulty  with  ambulating recently.  We discussed having PT evaluate and if requiring rehab, he is interested otherwise plan would be to discharge home --SNF recommended.  Patient is medically stable for discharge as of 6/6.  Unfortunately [facility] has a COVID outbreak and cannot accept until least Wednesday.  TOC aware and I will reach out to [facility] to see if they can offer a bed  Lactic acidosis - resolved  Initial elevations appeared to be possibly reactive; low suspicion for infection or severe hypoperfusion  Bilateral renal masses Review of prior imaging shows that bilateral masses were present in 2014 and have both grown approximately 5 cm larger since then. - outpatient follow-up with urology.  History of PE-continue apixaban GERD-continue famotidine Chronic dCHF-continue Lasix, metoprolol; no s/s exacerbation  Hypertension-continue lisinopril Hyperlipidemia-continue rosuvastatin   DVT prophylaxis: Apixaban Code Status: Full Family Communication: None today.  Offered to call but patient declined Disposition Plan: Status is: Inpatient  Remains inpatient appropriate because:Unsafe d/c plan   Dispo: The patient is from: Home              Anticipated d/c is to: SNF              Patient currently is medically stable to d/c.   Difficult to place patient No   Patient scrotal exam is improving.  He is medically stable for discharge at this time.  Initial plan to send [facility] has been changed as facility now has a COVID outbreak and cannot offer a bed until at least Wednesday 6/8.  TOC aware we will reach out to [facility].  If [facility] is able to offer a bed patient can discharge as soon as today.           Level of care: Med-Surg  Consultants:   None  Procedures:   None  Antimicrobials:   None   Subjective: Patient seen and examined.  Scrotal pain improved.  No other complaints.  Objective: Vitals:   05/15/21 0404  05/15/21 0744 05/15/21 0913 05/15/21 1114  BP: 115/65 (!) 109/57 (!) 122/58 (!) 104/55  Pulse: 95 96 93 74  Resp: 18 18  10   Temp: 98.2 F (36.8 C) 98.2 F (36.8 C)  97.6 F (36.4 C)  TempSrc: Oral Oral  Oral  SpO2: 93% 99%  94%  Weight:      Height:        Intake/Output Summary (Last 24 hours) at 05/15/2021 1242 Last data filed at 05/15/2021 1145 Gross per 24 hour  Intake 500 ml  Output 2000 ml  Net -1500 ml   Filed Weights   05/05/21 0846 05/11/21 0724  Weight: (!) 190.5 kg (!) 189.5 kg    Examination:  General exam: Appears calm and comfortable  Respiratory system: Clear to auscultation. Respiratory effort normal. Cardiovascular system: S1 & S2 heard, RRR. No JVD, murmurs, rubs, gallops or clicks. No pedal edema. Gastrointestinal system: Obese, nontender, nondistended, normal bowel sounds Genitourinary: Edematous.  Improving over interval Central nervous system: Alert and oriented. No focal neurological deficits. Extremities: Symmetric 5 x 5 power. Skin: No rashes, lesions or ulcers Psychiatry: Judgement and insight appear normal. Mood & affect appropriate.     Data Reviewed: I have personally reviewed following labs and imaging studies  CBC: Recent Labs  Lab 05/09/21 0630 05/10/21 0518 05/11/21 0420 05/12/21 0509 05/14/21 0752  WBC 15.4* 21.0* 19.4* 17.6* 12.6*  NEUTROABS 12.5* 18.3* 16.3* 14.7* 9.8*  HGB 13.7 14.0 13.4 12.9* 12.6*  HCT 37.4*  41.3 39.2 37.2* 35.2*  MCV 94.4 91.8 90.3 92.1 93.4  PLT 277 303 315 324 343   Basic Metabolic Panel: Recent Labs  Lab 05/09/21 0630 05/10/21 0518 05/11/21 0420 05/12/21 0509 05/14/21 0752  NA 136 132* 133* 133* 134*  K 3.8 4.0 3.9 4.1 3.7  CL 94* 93* 93* 91* 92*  CO2 GLUCOSE 118* 154* 124* 99 137*  BUN 27* 20  CREATININE 0.91 0.98 1.08 1.31* 1.06  CALCIUM 9.2 8.9 8.7* 8.6* 8.6*  MG 1.9 2.2 2.3 2.5*  --    GFR: Estimated Creatinine Clearance: 116.7 mL/min (by C-G formula based  on SCr of 1.06 mg/dL). Liver Function Tests: No results for input(s): AST, ALT, ALKPHOS, BILITOT, PROT, ALBUMIN in the last 168 hours. No results for input(s): LIPASE, AMYLASE in the last 168 hours. No results for input(s): AMMONIA in the last 168 hours. Coagulation Profile: No results for input(s): INR, PROTIME in the last 168 hours. Cardiac Enzymes: No results for input(s): CKTOTAL, CKMB, CKMBINDEX, TROPONINI in the last 168 hours. BNP (last 3 results) No results for input(s): PROBNP in the last 8760 hours. HbA1C: No results for input(s): HGBA1C in the last 72 hours. CBG: No results for input(s): GLUCAP in the last 168 hours. Lipid Profile: No results for input(s): CHOL, HDL, LDLCALC, TRIG, CHOLHDL, LDLDIRECT in the last 72 hours. Thyroid Function Tests: No results for input(s): TSH, T4TOTAL, FREET4, T3FREE, THYROIDAB in the last 72 hours. Anemia Panel: No results for input(s): VITAMINB12, FOLATE, FERRITIN, TIBC, IRON, RETICCTPCT in the last 72 hours. Sepsis Labs: Recent Labs  Lab 05/11/21 0420  PROCALCITON 0.51    Recent Results (from the past 240 hour(s))  Expectorated Sputum Assessment w Gram Stain, Rflx to Resp Cult     Status: None   Collection Time: 05/06/21 12:53 PM   Specimen: Sputum  Result Value Ref Range Status   Specimen Description SPUTUM  Final   Special Requests NONE  Final   Sputum evaluation   Final    Sputum specimen not acceptable for testing.  Please recollect.   CALLED TO ZOIA CISNEROS  ON 05/06/21 SKL Performed at [facility], [address] [place], [place] [address]    Report Status 05/06/2021 FINAL  Final  CULTURE, BLOOD (ROUTINE X 2) w Reflex to ID Panel     Status: None   Collection Time: 05/08/21  4:51 AM   Specimen: BLOOD  Result Value Ref Range Status   Specimen Description BLOOD BLOOD RIGHT HAND  Final   Special Requests   Final    BOTTLES DRAWN AEROBIC AND ANAEROBIC Blood Culture adequate volume   Culture   Final    NO  GROWTH 5 DAYS Performed at [facility], [address] [place], [place] [address]    Report Status 05/13/2021 FINAL  Final  CULTURE, BLOOD (ROUTINE X 2) w Reflex to ID Panel     Status: None   Collection Time: 05/08/21  4:51 AM   Specimen: BLOOD  Result Value Ref Range Status   Specimen Description BLOOD RIGHT ANTECUBITAL  Final   Special Requests   Final    BOTTLES DRAWN AEROBIC AND ANAEROBIC Blood Culture adequate volume   Culture   Final    NO GROWTH 5 DAYS Performed at [facility], [address], [place], [place] [address]    Report Status 05/13/2021 FINAL  Final  Resp Panel by RT-PCR (Flu A&B, Covid) Nasopharyngeal Swab  Status: None   Collection Time: 05/09/21  4:54 PM   Specimen: Nasopharyngeal Swab; Nasopharyngeal(NP) swabs in vial transport medium  Result Value Ref Range Status   SARS Coronavirus 2 by RT PCR NEGATIVE NEGATIVE Final    Comment: (NOTE) SARS-CoV-2 target nucleic acids are NOT DETECTED.  The SARS-CoV-2 RNA is generally detectable in upper respiratory specimens during the acute phase of infection. The lowest concentration of SARS-CoV-2 viral copies this assay can detect is 138 copies/mL. A negative result does not preclude SARS-Cov-2 infection and should not be used as the sole basis for treatment or other patient management decisions. A negative result may occur with  improper specimen collection/handling, submission of specimen other than nasopharyngeal swab, presence of viral mutation(s) within the areas targeted by this assay, and inadequate number of viral copies(<138 copies/mL). A negative result must be combined with clinical observations, patient history, and epidemiological information. The expected result is Negative.  Fact Sheet for Patients:  BloggerCourse.com  Fact Sheet for Healthcare Providers:  SeriousBroker.it  This test is no t yet approved or cleared by the  [place] FDA and  has been authorized for detection and/or diagnosis of SARS-CoV-2 by FDA under an Emergency Use Authorization (EUA). This EUA will remain  in effect (meaning this test can be used) for the duration of the COVID-19 declaration under Section 564(b)(1) of the Act, 21 U.S.C.section 360bbb-3(b)(1), unless the authorization is terminated  or revoked sooner.       Influenza A by PCR NEGATIVE NEGATIVE Final   Influenza B by PCR NEGATIVE NEGATIVE Final    Comment: (NOTE) The Xpert Xpress SARS-CoV-2/FLU/RSV plus assay is intended as an aid in the diagnosis of influenza from Nasopharyngeal swab specimens and should not be used as a sole basis for treatment. Nasal washings and aspirates are unacceptable for Xpert Xpress SARS-CoV-2/FLU/RSV testing.  Fact Sheet for Patients: BloggerCourse.com  Fact Sheet for Healthcare Providers: SeriousBroker.it  This test is not yet approved or cleared by the [place] FDA and has been authorized for detection and/or diagnosis of SARS-CoV-2 by FDA under an Emergency Use Authorization (EUA). This EUA will remain in effect (meaning this test can be used) for the duration of the COVID-19 declaration under Section 564(b)(1) of the Act, 21 U.S.C. section 360bbb-3(b)(1), unless the authorization is terminated or revoked.  Performed at [facility], [address] [place], [place] [address]   SARS CORONAVIRUS 2 (TAT 6-24 HRS) Nasopharyngeal Nasopharyngeal Swab     Status: None   Collection Time: 05/14/21  5:42 PM   Specimen: Nasopharyngeal Swab  Result Value Ref Range Status   SARS Coronavirus 2 NEGATIVE NEGATIVE Final    Comment: (NOTE) SARS-CoV-2 target nucleic acids are NOT DETECTED.  The SARS-CoV-2 RNA is generally detectable in upper and lower respiratory specimens during the acute phase of infection. Negative results do not preclude SARS-CoV-2 infection, do not rule  out co-infections with other pathogens, and should not be used as the sole basis for treatment or other patient management decisions. Negative results must be combined with clinical observations, patient history, and epidemiological information. The expected result is Negative.  Fact Sheet for Patients: HairSlick.no  Fact Sheet for Healthcare Providers: quierodirigir.com  This test is not yet approved or cleared by the [place] FDA and  has been authorized for detection and/or diagnosis of SARS-CoV-2 by FDA under an Emergency Use Authorization (EUA). This EUA will remain  in effect (meaning this test can be used) for the duration of the COVID-19  declaration under Se ction 564(b)(1) of the Act, 21 U.S.C. section 360bbb-3(b)(1), unless the authorization is terminated or revoked sooner.  Performed at [facility], [address], [place], [place] [address]          Radiology Studies: No results found.      Scheduled Meds: . apixaban  5 mg Oral BID  . famotidine  20 mg Oral BID  . furosemide  40 mg Intravenous Daily  . ketorolac  15 mg Intravenous Q6H  . lisinopril  10 mg Oral Daily  . metoprolol succinate  50 mg Oral Daily  . mometasone-formoterol  2 puff Inhalation BID  . omega-3 acid ethyl esters  1 g Oral Daily  . polyethylene glycol  17 g Oral Daily  . rosuvastatin  20 mg Oral Daily  . senna-docusate  1 tablet Oral BID  . tiotropium  18 mcg Inhalation Daily   Continuous Infusions: . sodium chloride 250 mL (05/12/21 2346)  . ampicillin-sulbactam (UNASYN) IV 3 g (05/15/21 1200)     LOS: 10 days    Time spent: 15 minutes    Tresa Moore, MD [facility] Pager [phone]  If 7PM-7AM, please contact night-coverage 05/15/2021, 12:42 PM

## 2021-05-15 NOTE — TOC Progression Note (Signed)
Transition of Care [facility]) - Progression Note    Patient Details  Name: Thomas Tanner MRN: [id number] Date of Birth: 1951/07/13  [facility]) CM/SW Contact  Margarito Liner, LCSW Phone Number: 05/15/2021, 9:12 AM  Clinical Narrative: COVID results pending.    Expected Discharge Plan: Skilled Nursing Facility Barriers to Discharge: Continued Medical Work up  Expected Discharge Plan and Services Expected Discharge Plan: Skilled Nursing Facility     Post Acute Care Choice: Skilled Nursing Facility Living arrangements for the past 2 months: Single Family Home                                       Social Determinants of Health (SDOH) Interventions    Readmission Risk Interventions No flowsheet data found.

## 2021-05-15 NOTE — Care Management Important Message (Signed)
Important Message  Patient Details  Name: Thomas Tanner MRN: [id number] Date of Birth: Dec 25, 1950   Medicare Important Message Given:  Yes     [name] 05/15/2021, 11:33 AM

## 2021-05-15 NOTE — Progress Notes (Signed)
Physical Therapy Treatment Patient Details Name: Thomas Tanner MRN: [id number] DOB: [birth year] Today's Date: 05/15/2021    History of Present Illness Thomas Tanner is a 70 y.o. male with history of CHF, COPD on 3-1/2 L home oxygen, tobacco use disorder, recent saddle pulmonary embolism in August 2021, CAD, hypertension, chronic right sided low back pain with sciatica, who presents for back and chest pain.    PT Comments    Pt seen for PT tx with pt reluctantly agreeable with encouragement. Pt declines OOB mobility on multiple attempts from PT citing scrotal pain but declining asking for pain medication. PT educated pt on need for OOB mobility and pain control can assist with this but pt quickly changes the subject. Pt engages in BLE strengthening exercises with less than ideal form 2/2 body habitus & scrotal edema. Pt would benefit from ongoing skilled PT treatment to address activity tolerance & address OOB mobility. Pt reports he plans to engage more at [facility] but does not wish to be in more pain at this time.    Follow Up Recommendations  SNF     Equipment Recommendations  None recommended by PT    Recommendations for Other Services       Precautions / Restrictions Precautions Precautions: Fall Restrictions Weight Bearing Restrictions: No    Mobility  Bed Mobility                    Transfers                    Ambulation/Gait                 Stairs             Wheelchair Mobility    Modified Rankin (Stroke Patients Only)       Balance                                            Cognition Arousal/Alertness: Awake/alert Behavior During Therapy: WFL for tasks assessed/performed Overall Cognitive Status: Within Functional Limits for tasks assessed                                        Exercises General Exercises - Lower Extremity Ankle Circles/Pumps: AROM;Both;15  reps;Supine Short Arc Quad: AROM;Strengthening;Both;10 reps;Supine Heel Slides: AROM;Strengthening;Both;Supine;10 reps Hip ABduction/ADduction: AROM;Strengthening;Both;Supine;10 reps    General Comments        Pertinent Vitals/Pain Pain Assessment: Faces Faces Pain Scale: Hurts even more Pain Location: scrotum Pain Descriptors / Indicators: Discomfort Pain Intervention(s): Limited activity within patient's tolerance;Monitored during session    Home Living                      Prior Function            PT Goals (current goals can now be found in the care plan section) Acute Rehab PT Goals Patient Stated Goal: to get back to walking PT Goal Formulation: With patient Time For Goal Achievement: 05/20/21 Potential to Achieve Goals: Fair Progress towards PT goals: Progressing toward goals    Frequency    Min 2X/week      PT Plan Current plan remains appropriate    Co-evaluation  AM-PAC PT "6 Clicks" Mobility   Outcome Measure  Help needed turning from your back to your side while in a flat bed without using bedrails?: A Little Help needed moving from lying on your back to sitting on the side of a flat bed without using bedrails?: A Little Help needed moving to and from a bed to a chair (including a wheelchair)?: A Lot Help needed standing up from a chair using your arms (e.g., wheelchair or bedside chair)?: A Lot Help needed to walk in hospital room?: A Lot Help needed climbing 3-5 steps with a railing? : A Lot 6 Click Score: 14    End of Session Equipment Utilized During Treatment: Oxygen Activity Tolerance: Patient tolerated treatment well (self limits 2/2 c/o pain) Patient left: in bed;with call bell/phone within reach;with bed alarm set   PT Visit Diagnosis: Unsteadiness on feet (R26.81);Muscle weakness (generalized) (M62.81);Difficulty in walking, not elsewhere classified (R26.2)     Time: 3614-4315 PT Time Calculation (min)  (ACUTE ONLY): 9 min  Charges:  $Therapeutic Exercise: 8-22 mins                     Aleda Grana, PT, DPT 05/15/21, 11:16 AM    Sandi Mariscal 05/15/2021, 11:14 AM

## 2021-05-15 NOTE — TOC Progression Note (Addendum)
Transition of Care [facility]) - Progression Note    Patient Details  Name: Thomas Tanner MRN: [id number] Date of Birth: 02-28-1951  [facility]) CM/SW Contact  Margarito Liner, LCSW Phone Number: 05/15/2021, 10:06 AM  Clinical Narrative:  [facility] now has a COVID outbreak and wouldn't be able to accept until at least Wednesday. Patient is aware and agreeable to seeing if [facility] can still offer bed.   2:44 pm: [facility] has a bari bed available. [facility]. Will not be able to switch facility on current authorization so will start a new one.  3:22 pm: Uploaded clinicals into [facility] portal to start insurance authorization. Patient aware we could potentially get authorization today but more likely tomorrow.  Expected Discharge Plan: Skilled Nursing Facility Barriers to Discharge: Continued Medical Work up  Expected Discharge Plan and Services Expected Discharge Plan: Skilled Nursing Facility     Post Acute Care Choice: Skilled Nursing Facility Living arrangements for the past 2 months: Single Family Home                                       Social Determinants of Health (SDOH) Interventions    Readmission Risk Interventions No flowsheet data found.

## 2021-05-16 DIAGNOSIS — Z86718 Personal history of other venous thrombosis and embolism: Secondary | ICD-10-CM | POA: Diagnosis not present

## 2021-05-16 DIAGNOSIS — K611 Rectal abscess: Secondary | ICD-10-CM | POA: Diagnosis not present

## 2021-05-16 DIAGNOSIS — R1084 Generalized abdominal pain: Secondary | ICD-10-CM | POA: Diagnosis not present

## 2021-05-16 DIAGNOSIS — R532 Functional quadriplegia: Secondary | ICD-10-CM | POA: Diagnosis not present

## 2021-05-16 DIAGNOSIS — I10 Essential (primary) hypertension: Secondary | ICD-10-CM | POA: Diagnosis not present

## 2021-05-16 DIAGNOSIS — J9611 Chronic respiratory failure with hypoxia: Secondary | ICD-10-CM | POA: Diagnosis not present

## 2021-05-16 DIAGNOSIS — M1611 Unilateral primary osteoarthritis, right hip: Secondary | ICD-10-CM | POA: Diagnosis not present

## 2021-05-16 DIAGNOSIS — D509 Iron deficiency anemia, unspecified: Secondary | ICD-10-CM | POA: Diagnosis not present

## 2021-05-16 DIAGNOSIS — M545 Low back pain, unspecified: Secondary | ICD-10-CM | POA: Diagnosis not present

## 2021-05-16 DIAGNOSIS — Z86711 Personal history of pulmonary embolism: Secondary | ICD-10-CM | POA: Diagnosis not present

## 2021-05-16 DIAGNOSIS — I2510 Atherosclerotic heart disease of native coronary artery without angina pectoris: Secondary | ICD-10-CM | POA: Diagnosis not present

## 2021-05-16 DIAGNOSIS — N179 Acute kidney failure, unspecified: Secondary | ICD-10-CM | POA: Diagnosis not present

## 2021-05-16 DIAGNOSIS — Z743 Need for continuous supervision: Secondary | ICD-10-CM | POA: Diagnosis not present

## 2021-05-16 DIAGNOSIS — Z7401 Bed confinement status: Secondary | ICD-10-CM | POA: Diagnosis not present

## 2021-05-16 DIAGNOSIS — R6520 Severe sepsis without septic shock: Secondary | ICD-10-CM | POA: Diagnosis not present

## 2021-05-16 DIAGNOSIS — E785 Hyperlipidemia, unspecified: Secondary | ICD-10-CM | POA: Diagnosis not present

## 2021-05-16 DIAGNOSIS — A419 Sepsis, unspecified organism: Secondary | ICD-10-CM | POA: Diagnosis not present

## 2021-05-16 DIAGNOSIS — N493 Fournier gangrene: Secondary | ICD-10-CM | POA: Diagnosis present

## 2021-05-16 DIAGNOSIS — N2889 Other specified disorders of kidney and ureter: Secondary | ICD-10-CM | POA: Diagnosis not present

## 2021-05-16 DIAGNOSIS — Z7901 Long term (current) use of anticoagulants: Secondary | ICD-10-CM | POA: Diagnosis not present

## 2021-05-16 DIAGNOSIS — J449 Chronic obstructive pulmonary disease, unspecified: Secondary | ICD-10-CM | POA: Diagnosis not present

## 2021-05-16 DIAGNOSIS — R600 Localized edema: Secondary | ICD-10-CM | POA: Diagnosis not present

## 2021-05-16 DIAGNOSIS — Z20822 Contact with and (suspected) exposure to covid-19: Secondary | ICD-10-CM | POA: Diagnosis not present

## 2021-05-16 DIAGNOSIS — I110 Hypertensive heart disease with heart failure: Secondary | ICD-10-CM | POA: Diagnosis not present

## 2021-05-16 DIAGNOSIS — Z8673 Personal history of transient ischemic attack (TIA), and cerebral infarction without residual deficits: Secondary | ICD-10-CM | POA: Diagnosis not present

## 2021-05-16 DIAGNOSIS — E872 Acidosis: Secondary | ICD-10-CM | POA: Diagnosis not present

## 2021-05-16 DIAGNOSIS — E119 Type 2 diabetes mellitus without complications: Secondary | ICD-10-CM | POA: Diagnosis not present

## 2021-05-16 DIAGNOSIS — K219 Gastro-esophageal reflux disease without esophagitis: Secondary | ICD-10-CM | POA: Diagnosis not present

## 2021-05-16 DIAGNOSIS — D649 Anemia, unspecified: Secondary | ICD-10-CM | POA: Diagnosis not present

## 2021-05-16 DIAGNOSIS — I5032 Chronic diastolic (congestive) heart failure: Secondary | ICD-10-CM | POA: Diagnosis not present

## 2021-05-16 DIAGNOSIS — E871 Hypo-osmolality and hyponatremia: Secondary | ICD-10-CM | POA: Diagnosis not present

## 2021-05-16 DIAGNOSIS — K5900 Constipation, unspecified: Secondary | ICD-10-CM | POA: Diagnosis not present

## 2021-05-16 DIAGNOSIS — Z6841 Body Mass Index (BMI) 40.0 and over, adult: Secondary | ICD-10-CM | POA: Diagnosis not present

## 2021-05-16 DIAGNOSIS — M7989 Other specified soft tissue disorders: Secondary | ICD-10-CM | POA: Diagnosis not present

## 2021-05-16 DIAGNOSIS — J441 Chronic obstructive pulmonary disease with (acute) exacerbation: Secondary | ICD-10-CM | POA: Diagnosis not present

## 2021-05-16 DIAGNOSIS — R6889 Other general symptoms and signs: Secondary | ICD-10-CM | POA: Diagnosis not present

## 2021-05-16 DIAGNOSIS — L0291 Cutaneous abscess, unspecified: Secondary | ICD-10-CM | POA: Diagnosis present

## 2021-05-16 DIAGNOSIS — R6521 Severe sepsis with septic shock: Secondary | ICD-10-CM | POA: Diagnosis not present

## 2021-05-16 MED ORDER — SENNOSIDES-DOCUSATE SODIUM 8.6-50 MG PO TABS: 1.0000 | ORAL_TABLET | Freq: Two times a day (BID) | ORAL | Status: DC

## 2021-05-16 MED ORDER — FUROSEMIDE 40 MG PO TABS: 40.0000 mg | ORAL_TABLET | Freq: Every day | ORAL | Status: DC

## 2021-05-16 MED ORDER — PHENYLEPHRINE-MINERAL OIL-PET 0.25-14-74.9 % RE OINT: 1.0000 "application " | TOPICAL_OINTMENT | Freq: Two times a day (BID) | RECTAL | Status: DC | PRN

## 2021-05-16 MED ORDER — AMOXICILLIN-POT CLAVULANATE 875-125 MG PO TABS: 1.0000 | ORAL_TABLET | Freq: Two times a day (BID) | ORAL | 0 refills | Status: DC

## 2021-05-16 MED ORDER — POLYETHYLENE GLYCOL 3350 17 G PO PACK: 17.0000 g | PACK | Freq: Every day | ORAL | 0 refills | Status: DC

## 2021-05-16 NOTE — Progress Notes (Signed)
Patient to be discharged to [facility].  Report called to faciity to [facility] who verbalized understanding and agreement.  Awaiting EMS to transport.  Discharge instructions placed in EMS packet for transport.

## 2021-05-16 NOTE — Discharge Summary (Signed)
Physician Discharge Summary  Thomas Tanner ZOX:[id number] DOB: [birth year] DOA: 05/05/2021  PCP: Theotis Burrow, NP (Inactive)  Admit date: 05/05/2021 Discharge date: 05/16/2021  Admitted From: Home Disposition: SNF, [facility]  Recommendations for Outpatient Follow-up:  1. Follow up with PCP in 1-2 weeks 2.   Home Health: No Equipment/Devices: Oxygen 3 L Discharge Condition: Stable CODE STATUS: Full Diet recommendation: Heart healthy/carb modified Brief/Interim Summary: 70 y.o.malewith history of CHF, COPD on 3.5 L home oxygen, tobacco use disorder, recent saddle pulmonary embolism in August 2021, CAD, hypertension, chronic right sided low back pain with sciatica, who presented for back, CP, and rectal pain.  Patient reports the primary reason for presentation today was due to chest pains. He reports that were sharp and very short in duration, started in the morning, located centrally in his chest, not associated with nausea or sweating. He did not think that he felt like heart attack pains but he wanted to come and be evaluated anyways.  He hasalso been in significant distress because of constipation and rectal pain. Reports that starting last week and he is extremely constipated, passed a very large caliber stool which he felt caused trauma. Subsequently he is continue to be constipated and had a very painful rectum.  Was found to have significant constipation without other obvious cause for initial presentation.  Also discovered to have Bacteroides bacteremia, likely etiology translocation secondary to underlying severe constipation and stool burden.  6/2: Constipation resolved.  Patient endorsing significant scrotal pain this morning.  Upon examination patient has a markedly enlarged and edematous scrotum.  See below for plan  6/3: Scrotal ultrasound with marked scrotal edema negative for torsion.  Patient was still with significant scrotal edema. 6/4:  Patient diuresing.  Scrotal exam slowly improving 6/6: Pain control improved 6/7: Scrotum remains swollen but improving over interval.  Pain control also improved.  No fevers over interval.  Stable for discharge to skilled nursing facility.  Discharge Diagnoses:  Principal Problem:   Constipated Active Problems:   Chronic right-sided low back pain with right-sided sciatica   Hypertension   CAD in native artery   Morbid obesity with BMI of 50.0-59.9, adult (HCC)   Cigarette smoker motivated to quit   COPD with acute exacerbation (HCC)   Physical deconditioning   Bacteremia due to Gram-negative bacteria  Scrotal swelling Associated with pain Noted on physical exam Scrotal ultrasound negative for torsion, demonstrate significant scrotal wall edema Relatively low suspicion for cellulitis Procalcitonin inconclusive Improving over interval IV Lasix in house Transition to p.o. Lasix 40 mg daily at time of discharge Antibiotics as below Outpatient PCP follow-up   Bacteroidesbacteremia -Possible translocation from his underlying severe constipation/stool burden. Admission blood cultures now showing 2/4 bottles (anaerobic bottle from each set) positive with GNR (speciated to Bacteroides)from 05/05/21. -DC Rocephinon 5/31 -Patient was maintained on Unasyn in house.  Will transition to Augmentin at time of discharge.  Complete 4 additional days for treatment of bacteremia  Chest pain - resolved  COPD, possible exacerbation Patient's chest pain history not suggestive of cardiac ischemia. EKG is unchanged from prior without any ischemic changes. Troponin is flat and likely demand related -Completed 5-day course of azithromycin and prednisone -Continue chronic oxygen, currently on home setting of 3.5 L -Start on [place] -Outpatient referral to pulmonology  Constipation Rectal pain Patient has been straining and had large bowel movement. Rectal pain likely from  ongoing stool burden -Continue laxative regimen -Continue Preparation H as needed -Multiple BMs over  interval -Patient stable for discharge.  Constipation and rectal pain resolved.  Scrotal swelling improving.  Physical deconditioning -Patient less ambulatory than usual and endorses more difficulty with ambulating recently. We discussed having PT evaluate and if requiring rehab, he is interested otherwise plan would be to discharge home --SNF recommended.  Patient is medically stable for discharge as of 6/6.   Lactic acidosis - resolved  Initial elevations appeared to be possibly reactive; low suspicion for infection or severe hypoperfusion  Bilateral renal masses Review of prior imaging shows that bilateral masses were present in 2014 and have both grown approximately 5 cm larger since then. - outpatient follow-up with urology.  History of PE-continue apixaban GERD-continue famotidine Chronic dCHF-continue Lasix, metoprolol; no s/s exacerbation  Hypertension-continue lisinopril Hyperlipidemia-continue rosuvastatin  Discharge Instructions  Discharge Instructions    Diet - low sodium heart healthy   Complete by: As directed    Increase activity slowly   Complete by: As directed      Allergies as of 05/16/2021      Reactions   Morphine Anxiety, Other (See Comments)   Hallucinations   Morphine And Related Anxiety      Medication List    STOP taking these medications   traMADol 50 MG tablet Commonly known as: ULTRAM     TAKE these medications   amoxicillin-clavulanate 875-125 MG tablet Commonly known as: Augmentin Take 1 tablet by mouth 2 (two) times daily for 4 days. Start taking on: May 17, 2021   apixaban 5 MG Tabs tablet Commonly known as: ELIQUIS Take 1 tablet (5 mg total) by mouth 2 (two) times daily.   famotidine 20 MG tablet Commonly known as: PEPCID Take 1 tablet (20 mg total) by mouth 2 (two) times daily.   furosemide 40 MG tablet Commonly  known as: LASIX Take 1 tablet (40 mg total) by mouth daily. What changed:   medication strength  how much to take   lisinopril 10 MG tablet Commonly known as: ZESTRIL TAKE 1 TABLET BY MOUTH ONCE DAILY What changed: See the new instructions.   metoprolol succinate 50 MG 24 hr tablet Commonly known as: TOPROL-XL Take 1 tablet by mouth once daily   nicotine 21 mg/24hr patch Commonly known as: NICODERM CQ - dosed in mg/24 hours Place 1 patch (21 mg total) onto the skin daily.   Omega-3 500 MG Caps Take 1 tab po daily What changed:   how much to take  how to take this  when to take this  additional instructions   phenylephrine-shark liver oil-mineral oil-petrolatum 0.25-14-74.9 % rectal ointment Commonly known as: PREPARATION H Place 1 application rectally 2 (two) times daily as needed for hemorrhoids.   polyethylene glycol 17 g packet Commonly known as: MIRALAX / GLYCOLAX Take 17 g by mouth daily. Start taking on: May 17, 2021   rosuvastatin 20 MG tablet Commonly known as: CRESTOR Take 1 tablet (20 mg total) by mouth daily.   senna-docusate 8.6-50 MG tablet Commonly known as: Senokot-S Take 1 tablet by mouth 2 (two) times daily.   Ventolin HFA 108 (90 Base) MCG/ACT inhaler Generic drug: albuterol Inhale 2 puffs into the lungs every 6 (six) hours as needed for wheezing or shortness of breath.       Contact information for after-discharge care    Destination    [facility].   Service: Skilled Nursing Why: they will call the patient Contact information: [address] [place] [address] [phone]  Allergies  Allergen Reactions  . Morphine Anxiety and Other (See Comments)    Hallucinations  . Morphine And Related Anxiety    Consultations:  None   Procedures/Studies: DG Chest 2 View  Result Date: 05/05/2021 CLINICAL DATA:  Chest pain. EXAM: CHEST - 2 VIEW COMPARISON:  August 03, 2020. FINDINGS: Stable cardiomegaly with mild central pulmonary vascular congestion. No pneumothorax or pleural effusion is noted. No consolidative process is noted. Bony thorax is unremarkable. IMPRESSION: Stable cardiomegaly with central pulmonary vascular congestion. Electronically Signed   By: Lupita RaiderJames  Green Jr M.D.   On: 05/05/2021 09:32   CT Angio Chest/Abd/Pel for Dissection W and/or Wo Contrast  Result Date: 05/05/2021 CLINICAL DATA:  Acute abdominal pain, concern for dissection, remote history of saddle pulmonary embolus 08/02/2020 EXAM: CT ANGIOGRAPHY CHEST, ABDOMEN AND PELVIS TECHNIQUE: Non-contrast CT of the chest was initially obtained. Multidetector CT imaging through the chest, abdomen and pelvis was performed using the standard protocol during bolus administration of intravenous contrast. Multiplanar reconstructed images and MIPs were obtained and reviewed to evaluate the vascular anatomy. CONTRAST:  100mL OMNIPAQUE IOHEXOL 350 MG/ML SOLN COMPARISON:  08/02/2020, 05/12/2013 FINDINGS: CTA CHEST FINDINGS Cardiovascular: Initial noncontrast imaging demonstrates calcific atherosclerosis of the aorta. No significant hyperdense acute intramural hematoma. Exam is limited with respiratory motion artifact. Native coronary atherosclerosis. Postcontrast, the thoracic aorta appears intact. Negative for acute dissection or aneurysm. No mediastinal hemorrhage or hematoma. Central pulmonary arteries appear patent and normal in caliber. No large central PE. Central veins including the innominate veins and SVC are patent. No veno-occlusive process. Overall normal heart size.  No pericardial effusion. Mediastinum/Nodes: Heterogeneous nodular left inferior thyroid enlargement measuring 3.8 cm. Trachea and central airways are patent. Esophagus nondilated. No hiatal hernia. Negative for adenopathy. Lungs/Pleura: Minor scattered areas of parenchymal scarring. No acute airspace process, collapse or consolidation.  Negative for interstitial disease or edema. No pleural abnormality, effusion or pneumothorax. Musculoskeletal: Degenerative changes noted of the spine. Old rib fractures evident. No acute osseous finding. Sternum intact. Review of the MIP images confirms the above findings. CTA ABDOMEN AND PELVIS FINDINGS VASCULAR Aorta: Intact abdominal aorta. Negative for acute dissection or aneurysm. No evidence of rupture or retroperitoneal hematoma. Atherosclerotic changes noted throughout the abdominal aorta. Celiac: Origin remains patent including its branches centrally. SMA: Origin remains patent including its branches in the central mesentery. Renals: Calcified origins but appear to remain patent. IMA: Calcified but appears to remain patent off the distal aorta. Inflow: Tortuosity and atherosclerosis of the iliac vessels without significant inflow disease or occlusion. Limited assessment because of contrast opacification. Veins: No abdominopelvic veno-occlusive process. Review of the MIP images confirms the above findings. NON-VASCULAR Hepatobiliary: No focal liver abnormality is seen. No gallstones, gallbladder wall thickening, or biliary dilatation. Pancreas: Unremarkable. No pancreatic ductal dilatation or surrounding inflammatory changes. Spleen: Normal in size without focal abnormality. Adrenals/Urinary Tract: Normal adrenal glands for age. Right kidney demonstrates a exophytic posterior hypodense lesion without definite enhancement measuring 10 cm compatible with a renal cyst. Left kidney also has a heterogeneous posterior mixed density exophytic lesion measuring up to 9.4 cm remaining indeterminate for solid components by single-phase imaging. Recommend further evaluation with nonemergent abdominal MRI without and with contrast. No acute renal obstruction or hydronephrosis. No hydroureter. Bladder unremarkable. Stomach/Bowel: Limited without oral contrast. Negative for bowel obstruction, significant dilatation,  ileus, or free air. Normal appearing appendix containing air. No free fluid, fluid collection, hemorrhage, hematoma, abscess or ascites. Lymphatic: No bulky adenopathy. Reproductive: No significant finding  by CT. Other: Nonspecific mild presacral strandy edema without clear etiology or mass. Musculoskeletal: Degenerative changes noted of the spine. Advanced right hip degenerative arthropathy with acetabular and femoral head cystic change. No acute osseous finding or fracture. Review of the MIP images confirms the above findings. IMPRESSION: Thoracoabdominal atherosclerosis without dissection, aneurysm or other acute vascular process. Resolved bilateral pulmonary emboli compared to 08/02/2020. No other acute intrathoracic finding. 9.4 cm exophytic posterolateral left renal mass, indeterminate for cystic and solid components by single-phase imaging. Renal neoplasm not excluded. Recommend further evaluation with nonemergent abdominal MRI without and with contrast. Additional 10 cm right posterior renal cyst. No other acute intra-abdominal or pelvic finding. Electronically Signed   By: Judie Petit.  Shick M.D.   On: 05/05/2021 11:17   US SCROTUM W/DOPPLER  Result Date: 05/11/2021 CLINICAL DATA:  70 year old male with testicular swelling for the past 4 days EXAM: SCROTAL ULTRASOUND DOPPLER ULTRASOUND OF THE TESTICLES TECHNIQUE: Complete ultrasound examination of the testicles, epididymis, and other scrotal structures was performed. Color and spectral Doppler ultrasound were also utilized to evaluate blood flow to the testicles. COMPARISON:  None. FINDINGS: Right testicle Measurements: 4.6 x 2.8 x 2.6 cm. No mass or microlithiasis visualized. Left testicle Measurements: 4.5 x 2.5 x 3.0 cm. No mass or microlithiasis visualized. Right epididymis:  Normal in size and appearance. Left epididymis:  Normal in size and appearance. Hydrocele:  None visualized. Varicocele:  None visualized. Pulsed Doppler interrogation of both testes  demonstrates normal low resistance arterial and venous waveforms bilaterally. IMPRESSION: 1. Extensive thickening of the scrotum measuring up to 1.8 cm. Differential considerations include scrotal edema versus cellulitis. 2. Normal sonographic appearance of the testes without evidence of torsion. Electronically Signed   By: Malachy Moan M.D.   On: 05/11/2021 12:04   (Echo, Carotid, EGD, Colonoscopy, ERCP)    Subjective: Seen and examined on day of discharge.  Scrotal pain has improved.  Stable for discharge to skilled nursing facility  Discharge Exam: Vitals:   05/16/21 0732 05/16/21 0903  BP: (!) 112/52 128/61  Pulse: 77 81  Resp: 16   Temp: 97.8 F (36.6 C)   SpO2: 90%    Vitals:   05/15/21 2343 05/16/21 0509 05/16/21 0732 05/16/21 0903  BP: (!) 116/52 114/69 (!) 112/52 128/61  Pulse: 79 80 77 81  Resp: Temp: 98 F (36.7 C) 98 F (36.7 C) 97.8 F (36.6 C)   TempSrc:  Oral Oral   SpO2: 93% 96% 90%   Weight:      Height:        General: Pt is alert, awake, not in acute distress Cardiovascular: RRR, S1/S2 +, no rubs, no gallops Respiratory: CTA bilaterally, no wheezing, no rhonchi Abdominal: Soft, NT, ND, bowel sounds + Extremities: no edema, no cyanosis    The results of significant diagnostics from this hospitalization (including imaging, microbiology, ancillary and laboratory) are listed below for reference.     Microbiology: Recent Results (from the past 240 hour(s))  Expectorated Sputum Assessment w Gram Stain, Rflx to Resp Cult     Status: None   Collection Time: 05/06/21 12:53 PM   Specimen: Sputum  Result Value Ref Range Status   Specimen Description SPUTUM  Final   Special Requests NONE  Final   Sputum evaluation   Final    Sputum specimen not acceptable for testing.  Please recollect.   CALLED TO ZOIA CISNEROS  ON 05/06/21 SKL Performed at [facility], [address]  [place], [place] [address]    Report Status 05/06/2021  FINAL  Final  CULTURE, BLOOD (ROUTINE X 2) w Reflex to ID Panel     Status: None   Collection Time: 05/08/21  4:51 AM   Specimen: BLOOD  Result Value Ref Range Status   Specimen Description BLOOD BLOOD RIGHT HAND  Final   Special Requests   Final    BOTTLES DRAWN AEROBIC AND ANAEROBIC Blood Culture adequate volume   Culture   Final    NO GROWTH 5 DAYS Performed at [facility], [address] [place], [place] [address]    Report Status 05/13/2021 FINAL  Final  CULTURE, BLOOD (ROUTINE X 2) w Reflex to ID Panel     Status: None   Collection Time: 05/08/21  4:51 AM   Specimen: BLOOD  Result Value Ref Range Status   Specimen Description BLOOD RIGHT ANTECUBITAL  Final   Special Requests   Final    BOTTLES DRAWN AEROBIC AND ANAEROBIC Blood Culture adequate volume   Culture   Final    NO GROWTH 5 DAYS Performed at [facility], [address] [place], [place] [address]    Report Status 05/13/2021 FINAL  Final  Resp Panel by RT-PCR (Flu A&B, Covid) Nasopharyngeal Swab     Status: None   Collection Time: 05/09/21  4:54 PM   Specimen: Nasopharyngeal Swab; Nasopharyngeal(NP) swabs in vial transport medium  Result Value Ref Range Status   SARS Coronavirus 2 by RT PCR NEGATIVE NEGATIVE Final    Comment: (NOTE) SARS-CoV-2 target nucleic acids are NOT DETECTED.  The SARS-CoV-2 RNA is generally detectable in upper respiratory specimens during the acute phase of infection. The lowest concentration of SARS-CoV-2 viral copies this assay can detect is 138 copies/mL. A negative result does not preclude SARS-Cov-2 infection and should not be used as the sole basis for treatment or other patient management decisions. A negative result may occur with  improper specimen collection/handling, submission of specimen other than nasopharyngeal swab, presence of viral mutation(s) within the areas targeted by this assay, and inadequate number of viral copies(<138 copies/mL). A  negative result must be combined with clinical observations, patient history, and epidemiological information. The expected result is Negative.  Fact Sheet for Patients:  BloggerCourse.com  Fact Sheet for Healthcare Providers:  SeriousBroker.it  This test is no t yet approved or cleared by the [place] FDA and  has been authorized for detection and/or diagnosis of SARS-CoV-2 by FDA under an Emergency Use Authorization (EUA). This EUA will remain  in effect (meaning this test can be used) for the duration of the COVID-19 declaration under Section 564(b)(1) of the Act, 21 U.S.C.section 360bbb-3(b)(1), unless the authorization is terminated  or revoked sooner.       Influenza A by PCR NEGATIVE NEGATIVE Final   Influenza B by PCR NEGATIVE NEGATIVE Final    Comment: (NOTE) The Xpert Xpress SARS-CoV-2/FLU/RSV plus assay is intended as an aid in the diagnosis of influenza from Nasopharyngeal swab specimens and should not be used as a sole basis for treatment. Nasal washings and aspirates are unacceptable for Xpert Xpress SARS-CoV-2/FLU/RSV testing.  Fact Sheet for Patients: BloggerCourse.com  Fact Sheet for Healthcare Providers: SeriousBroker.it  This test is not yet approved or cleared by the [place] FDA and has been authorized for detection and/or diagnosis of SARS-CoV-2 by FDA under an Emergency Use Authorization (EUA). This EUA will remain in effect (meaning this test can be used) for the duration of the  COVID-19 declaration under Section 564(b)(1) of the Act, 21 U.S.C. section 360bbb-3(b)(1), unless the authorization is terminated or revoked.  Performed at [facility], [address] [place], [place] [address]   SARS CORONAVIRUS 2 (TAT 6-24 HRS) Nasopharyngeal Nasopharyngeal Swab     Status: None   Collection Time: 05/14/21  5:42 PM   Specimen:  Nasopharyngeal Swab  Result Value Ref Range Status   SARS Coronavirus 2 NEGATIVE NEGATIVE Final    Comment: (NOTE) SARS-CoV-2 target nucleic acids are NOT DETECTED.  The SARS-CoV-2 RNA is generally detectable in upper and lower respiratory specimens during the acute phase of infection. Negative results do not preclude SARS-CoV-2 infection, do not rule out co-infections with other pathogens, and should not be used as the sole basis for treatment or other patient management decisions. Negative results must be combined with clinical observations, patient history, and epidemiological information. The expected result is Negative.  Fact Sheet for Patients: HairSlick.no  Fact Sheet for Healthcare Providers: quierodirigir.com  This test is not yet approved or cleared by the [place] FDA and  has been authorized for detection and/or diagnosis of SARS-CoV-2 by FDA under an Emergency Use Authorization (EUA). This EUA will remain  in effect (meaning this test can be used) for the duration of the COVID-19 declaration under Se ction 564(b)(1) of the Act, 21 U.S.C. section 360bbb-3(b)(1), unless the authorization is terminated or revoked sooner.  Performed at [facility], [address], [place], [place] [address]      Labs: BNP (last 3 results) Recent Labs    08/02/20 1107 08/03/20 2038 05/05/21 0851  BNP 503.4* 218.8* 81.8   Basic Metabolic Panel: Recent Labs  Lab 05/10/21 0518 05/11/21 0420 05/12/21 0509 05/14/21 0752  NA 132* 133* 133* 134*  K 4.0 3.9 4.1 3.7  CL 93* 93* 91* 92*  CO2 GLUCOSE 154* 124* 99 137*  BUN 19 21 27* 20  CREATININE 0.98 1.08 1.31* 1.06  CALCIUM 8.9 8.7* 8.6* 8.6*  MG 2.2 2.3 2.5*  --    Liver Function Tests: No results for input(s): AST, ALT, ALKPHOS, BILITOT, PROT, ALBUMIN in the last 168 hours. No results for input(s): LIPASE, AMYLASE in the last 168 hours. No  results for input(s): AMMONIA in the last 168 hours. CBC: Recent Labs  Lab 05/10/21 0518 05/11/21 0420 05/12/21 0509 05/14/21 0752  WBC 21.0* 19.4* 17.6* 12.6*  NEUTROABS 18.3* 16.3* 14.7* 9.8*  HGB 14.0 13.4 12.9* 12.6*  HCT 41.3 39.2 37.2* 35.2*  MCV 91.8 90.3 92.1 93.4  PLT 303 315 324 343   Cardiac Enzymes: No results for input(s): CKTOTAL, CKMB, CKMBINDEX, TROPONINI in the last 168 hours. BNP: Invalid input(s): POCBNP CBG: No results for input(s): GLUCAP in the last 168 hours. D-Dimer No results for input(s): DDIMER in the last 72 hours. Hgb A1c No results for input(s): HGBA1C in the last 72 hours. Lipid Profile No results for input(s): CHOL, HDL, LDLCALC, TRIG, CHOLHDL, LDLDIRECT in the last 72 hours. Thyroid function studies No results for input(s): TSH, T4TOTAL, T3FREE, THYROIDAB in the last 72 hours.  Invalid input(s): FREET3 Anemia work up No results for input(s): VITAMINB12, FOLATE, FERRITIN, TIBC, IRON, RETICCTPCT in the last 72 hours. Urinalysis    Component Value Date/Time   COLORURINE AMBER (A) 05/05/2021 0932   APPEARANCEUR HAZY (A) 05/05/2021 0932   APPEARANCEUR Clear 06/10/2013 1212   LABSPEC 1.020 05/05/2021 0932   LABSPEC 1.019 06/10/2013 1212   PHURINE 6.0 05/05/2021  0932   GLUCOSEU NEGATIVE 05/05/2021 0932   GLUCOSEU Negative 06/10/2013 1212   HGBUR SMALL (A) 05/05/2021 0932   BILIRUBINUR NEGATIVE 05/05/2021 0932   BILIRUBINUR Negative 06/10/2013 1212   KETONESUR 20 (A) 05/05/2021 0932   PROTEINUR 30 (A) 05/05/2021 0932   NITRITE NEGATIVE 05/05/2021 0932   LEUKOCYTESUR TRACE (A) 05/05/2021 0932   LEUKOCYTESUR Negative 06/10/2013 1212   Sepsis Labs Invalid input(s): PROCALCITONIN,  WBC,  LACTICIDVEN Microbiology Recent Results (from the past 240 hour(s))  Expectorated Sputum Assessment w Gram Stain, Rflx to Resp Cult     Status: None   Collection Time: 05/06/21 12:53 PM   Specimen: Sputum  Result Value Ref Range Status   Specimen  Description SPUTUM  Final   Special Requests NONE  Final   Sputum evaluation   Final    Sputum specimen not acceptable for testing.  Please recollect.   CALLED TO ZOIA CISNEROS @1503  ON 05/06/21 SKL Performed at [facility], [address] [place], [place]    Report Status 05/06/2021 FINAL  Final  CULTURE, BLOOD (ROUTINE X 2) w Reflex to ID Panel     Status: None   Collection Time: 05/08/21  4:51 AM   Specimen: BLOOD  Result Value Ref Range Status   Specimen Description BLOOD BLOOD RIGHT HAND  Final   Special Requests   Final    BOTTLES DRAWN AEROBIC AND ANAEROBIC Blood Culture adequate volume   Culture   Final    NO GROWTH 5 DAYS Performed at [facility], [address] [place], [place]    Report Status 05/13/2021 FINAL  Final  CULTURE, BLOOD (ROUTINE X 2) w Reflex to ID Panel     Status: None   Collection Time: 05/08/21  4:51 AM   Specimen: BLOOD  Result Value Ref Range Status   Specimen Description BLOOD RIGHT ANTECUBITAL  Final   Special Requests   Final    BOTTLES DRAWN AEROBIC AND ANAEROBIC Blood Culture adequate volume   Culture   Final    NO GROWTH 5 DAYS Performed at [facility], [address] [place], [place]    Report Status 05/13/2021 FINAL  Final  Resp Panel by RT-PCR (Flu A&B, Covid) Nasopharyngeal Swab     Status: None   Collection Time: 05/09/21  4:54 PM   Specimen: Nasopharyngeal Swab; Nasopharyngeal(NP) swabs in vial transport medium  Result Value Ref Range Status   SARS Coronavirus 2 by RT PCR NEGATIVE NEGATIVE Final    Comment: (NOTE) SARS-CoV-2 target nucleic acids are NOT DETECTED.  The SARS-CoV-2 RNA is generally detectable in upper respiratory specimens during the acute phase of infection. The lowest concentration of SARS-CoV-2 viral copies this assay can detect is 138 copies/mL. A negative result does not preclude SARS-Cov-2 infection and should not be used as the sole basis for  treatment or other patient management decisions. A negative result may occur with  improper specimen collection/handling, submission of specimen other than nasopharyngeal swab, presence of viral mutation(s) within the areas targeted by this assay, and inadequate number of viral copies(<138 copies/mL). A negative result must be combined with clinical observations, patient history, and epidemiological information. The expected result is Negative.  Fact Sheet for Patients:  05/11/21  Fact Sheet for Healthcare Providers:  BloggerCourse.com  This test is no t yet approved or cleared by the [place] FDA and  has been authorized for detection and/or diagnosis of SARS-CoV-2 by FDA under an Emergency Use Authorization (  EUA). This EUA will remain  in effect (meaning this test can be used) for the duration of the COVID-19 declaration under Section 564(b)(1) of the Act, 21 U.S.C.section 360bbb-3(b)(1), unless the authorization is terminated  or revoked sooner.       Influenza A by PCR NEGATIVE NEGATIVE Final   Influenza B by PCR NEGATIVE NEGATIVE Final    Comment: (NOTE) The Xpert Xpress SARS-CoV-2/FLU/RSV plus assay is intended as an aid in the diagnosis of influenza from Nasopharyngeal swab specimens and should not be used as a sole basis for treatment. Nasal washings and aspirates are unacceptable for Xpert Xpress SARS-CoV-2/FLU/RSV testing.  Fact Sheet for Patients: BloggerCourse.com  Fact Sheet for Healthcare Providers: SeriousBroker.it  This test is not yet approved or cleared by the [place] FDA and has been authorized for detection and/or diagnosis of SARS-CoV-2 by FDA under an Emergency Use Authorization (EUA). This EUA will remain in effect (meaning this test can be used) for the duration of the COVID-19 declaration under Section 564(b)(1) of the Act, 21  U.S.C. section 360bbb-3(b)(1), unless the authorization is terminated or revoked.  Performed at [facility], [address] [place], [place] [address]   SARS CORONAVIRUS 2 (TAT 6-24 HRS) Nasopharyngeal Nasopharyngeal Swab     Status: None   Collection Time: 05/14/21  5:42 PM   Specimen: Nasopharyngeal Swab  Result Value Ref Range Status   SARS Coronavirus 2 NEGATIVE NEGATIVE Final    Comment: (NOTE) SARS-CoV-2 target nucleic acids are NOT DETECTED.  The SARS-CoV-2 RNA is generally detectable in upper and lower respiratory specimens during the acute phase of infection. Negative results do not preclude SARS-CoV-2 infection, do not rule out co-infections with other pathogens, and should not be used as the sole basis for treatment or other patient management decisions. Negative results must be combined with clinical observations, patient history, and epidemiological information. The expected result is Negative.  Fact Sheet for Patients: HairSlick.no  Fact Sheet for Healthcare Providers: quierodirigir.com  This test is not yet approved or cleared by the [place] FDA and  has been authorized for detection and/or diagnosis of SARS-CoV-2 by FDA under an Emergency Use Authorization (EUA). This EUA will remain  in effect (meaning this test can be used) for the duration of the COVID-19 declaration under Se ction 564(b)(1) of the Act, 21 U.S.C. section 360bbb-3(b)(1), unless the authorization is terminated or revoked sooner.  Performed at [facility], [address], [place], [place] [address]      Time coordinating discharge: Over 30 minutes  SIGNED:   Tresa Moore, MD  [facility] 05/16/2021, 10:22 AM Pager   If 7PM-7AM, please contact night-coverage

## 2021-05-16 NOTE — TOC Transition Note (Signed)
Transition of Care [facility]) - CM/SW Discharge Note   Patient Details  Name: Thomas Tanner MRN: [id number] Date of Birth: 09/21/1951  Transition of Care [facility]) CM/SW Contact:  Margarito Liner, LCSW Phone Number: 05/16/2021, 12:04 PM   Clinical Narrative:  Patient has orders to discharge to [facility] today. RN will call report to [phone] (Room 128B). Non-emergency ambulance transport set up through [facility] since he is going to [place]. Patient asked that CSW call wife with location information. Left her a voicemail. No further concerns. CSW signing off.   Final next level of care: Skilled Nursing Facility Barriers to Discharge: Barriers Resolved   Patient Goals and CMS Choice     Choice offered to / list presented to : Patient  Discharge Placement PASRR number recieved: 05/07/21            Patient chooses bed at: Other - please specify in the comment section below: [facility]) Patient to be transferred to facility by: [facility] Name of family member notified: Left voicemail for wife, Thomas Tanner Patient and family notified of of transfer: 05/16/21  Discharge Plan and Services     Post Acute Care Choice: Skilled Nursing Facility                               Social Determinants of Health (SDOH) Interventions     Readmission Risk Interventions No flowsheet data found.

## 2021-05-16 NOTE — TOC Progression Note (Addendum)
Transition of Care [facility]) - Progression Note    Patient Details  Name: Thomas Tanner MRN: [id number] Date of Birth: 06-30-1951  Transition of Care [facility]) CM/SW Contact  Margarito Liner, LCSW Phone Number: 05/16/2021, 9:05 AM  Clinical Narrative: Insurance authorization approved: [id number]. Valid through tomorrow. Left message for [facility] admissions coordinator to notify her and let her know that COVID test was done Sunday and was negative.    10:53 am: [facility] admissions coordinator confirmed this morning that they are ready to accept patient. Left her a message that discharge summary is on the hub and to see what time transport could be arranged.  1:29 pm: Left voicemail for SNF admissions coordinator.  Expected Discharge Plan: Skilled Nursing Facility Barriers to Discharge: Continued Medical Work up  Expected Discharge Plan and Services Expected Discharge Plan: Skilled Nursing Facility     Post Acute Care Choice: Skilled Nursing Facility Living arrangements for the past 2 months: Single Family Home                                       Social Determinants of Health (SDOH) Interventions    Readmission Risk Interventions No flowsheet data found.

## 2021-05-16 NOTE — Plan of Care (Signed)
Patient discharged to facility via EMS.  PIV removed prior to transport. Discharge packet and patient belongings given to EMS transporter.

## 2021-05-17 DIAGNOSIS — I2510 Atherosclerotic heart disease of native coronary artery without angina pectoris: Secondary | ICD-10-CM | POA: Diagnosis not present

## 2021-05-17 DIAGNOSIS — M545 Low back pain, unspecified: Secondary | ICD-10-CM | POA: Diagnosis not present

## 2021-05-17 DIAGNOSIS — I10 Essential (primary) hypertension: Secondary | ICD-10-CM | POA: Diagnosis not present

## 2021-05-17 DIAGNOSIS — J441 Chronic obstructive pulmonary disease with (acute) exacerbation: Secondary | ICD-10-CM | POA: Diagnosis not present

## 2021-05-17 DIAGNOSIS — N2889 Other specified disorders of kidney and ureter: Secondary | ICD-10-CM | POA: Diagnosis not present

## 2021-05-17 DIAGNOSIS — K5900 Constipation, unspecified: Secondary | ICD-10-CM | POA: Diagnosis not present

## 2021-05-18 ENCOUNTER — Emergency Department: Payer: Medicare Other

## 2021-05-18 ENCOUNTER — Other Ambulatory Visit: Payer: Self-pay

## 2021-05-18 ENCOUNTER — Ambulatory Visit: Payer: Medicare Other | Admitting: Nurse Practitioner

## 2021-05-18 ENCOUNTER — Encounter
Admission: EM | Disposition: A | Discharge status: Home Health | Payer: Self-pay | Source: Home / Self Care | Attending: Internal Medicine

## 2021-05-18 ENCOUNTER — Inpatient Hospital Stay: Payer: Medicare Other | Admitting: Anesthesiology

## 2021-05-18 ENCOUNTER — Inpatient Hospital Stay
Admission: EM | Admit: 2021-05-18 | Discharge: 2021-05-27 | DRG: 853 | Disposition: A | Discharge status: Home Health | Payer: Medicare Other | Attending: Internal Medicine | Admitting: Internal Medicine

## 2021-05-18 ENCOUNTER — Encounter: Payer: Self-pay | Admitting: Intensive Care

## 2021-05-18 DIAGNOSIS — Z7901 Long term (current) use of anticoagulants: Secondary | ICD-10-CM

## 2021-05-18 DIAGNOSIS — I5032 Chronic diastolic (congestive) heart failure: Secondary | ICD-10-CM | POA: Diagnosis not present

## 2021-05-18 DIAGNOSIS — D509 Iron deficiency anemia, unspecified: Secondary | ICD-10-CM | POA: Diagnosis present

## 2021-05-18 DIAGNOSIS — J449 Chronic obstructive pulmonary disease, unspecified: Secondary | ICD-10-CM | POA: Diagnosis present

## 2021-05-18 DIAGNOSIS — I110 Hypertensive heart disease with heart failure: Secondary | ICD-10-CM | POA: Diagnosis not present

## 2021-05-18 DIAGNOSIS — E872 Acidosis: Secondary | ICD-10-CM | POA: Diagnosis present

## 2021-05-18 DIAGNOSIS — Z86718 Personal history of other venous thrombosis and embolism: Secondary | ICD-10-CM

## 2021-05-18 DIAGNOSIS — N200 Calculus of kidney: Secondary | ICD-10-CM | POA: Diagnosis present

## 2021-05-18 DIAGNOSIS — A419 Sepsis, unspecified organism: Principal | ICD-10-CM | POA: Diagnosis present

## 2021-05-18 DIAGNOSIS — Z9981 Dependence on supplemental oxygen: Secondary | ICD-10-CM

## 2021-05-18 DIAGNOSIS — Z8673 Personal history of transient ischemic attack (TIA), and cerebral infarction without residual deficits: Secondary | ICD-10-CM

## 2021-05-18 DIAGNOSIS — M1611 Unilateral primary osteoarthritis, right hip: Secondary | ICD-10-CM | POA: Diagnosis not present

## 2021-05-18 DIAGNOSIS — R600 Localized edema: Secondary | ICD-10-CM | POA: Diagnosis not present

## 2021-05-18 DIAGNOSIS — E1169 Type 2 diabetes mellitus with other specified complication: Secondary | ICD-10-CM | POA: Diagnosis present

## 2021-05-18 DIAGNOSIS — E119 Type 2 diabetes mellitus without complications: Secondary | ICD-10-CM | POA: Diagnosis present

## 2021-05-18 DIAGNOSIS — M2550 Pain in unspecified joint: Secondary | ICD-10-CM | POA: Diagnosis not present

## 2021-05-18 DIAGNOSIS — I2510 Atherosclerotic heart disease of native coronary artery without angina pectoris: Secondary | ICD-10-CM | POA: Diagnosis not present

## 2021-05-18 DIAGNOSIS — R532 Functional quadriplegia: Secondary | ICD-10-CM | POA: Diagnosis present

## 2021-05-18 DIAGNOSIS — Z6841 Body Mass Index (BMI) 40.0 and over, adult: Secondary | ICD-10-CM

## 2021-05-18 DIAGNOSIS — Z7401 Bed confinement status: Secondary | ICD-10-CM | POA: Diagnosis not present

## 2021-05-18 DIAGNOSIS — J9611 Chronic respiratory failure with hypoxia: Secondary | ICD-10-CM | POA: Diagnosis present

## 2021-05-18 DIAGNOSIS — I517 Cardiomegaly: Secondary | ICD-10-CM | POA: Diagnosis not present

## 2021-05-18 DIAGNOSIS — R6521 Severe sepsis with septic shock: Secondary | ICD-10-CM | POA: Diagnosis present

## 2021-05-18 DIAGNOSIS — K219 Gastro-esophageal reflux disease without esophagitis: Secondary | ICD-10-CM | POA: Diagnosis present

## 2021-05-18 DIAGNOSIS — R7881 Bacteremia: Secondary | ICD-10-CM | POA: Diagnosis not present

## 2021-05-18 DIAGNOSIS — F17210 Nicotine dependence, cigarettes, uncomplicated: Secondary | ICD-10-CM | POA: Diagnosis present

## 2021-05-18 DIAGNOSIS — E785 Hyperlipidemia, unspecified: Secondary | ICD-10-CM | POA: Diagnosis present

## 2021-05-18 DIAGNOSIS — R1084 Generalized abdominal pain: Secondary | ICD-10-CM | POA: Diagnosis not present

## 2021-05-18 DIAGNOSIS — E871 Hypo-osmolality and hyponatremia: Secondary | ICD-10-CM | POA: Diagnosis present

## 2021-05-18 DIAGNOSIS — K5900 Constipation, unspecified: Secondary | ICD-10-CM | POA: Diagnosis not present

## 2021-05-18 DIAGNOSIS — I10 Essential (primary) hypertension: Secondary | ICD-10-CM | POA: Diagnosis present

## 2021-05-18 DIAGNOSIS — M7989 Other specified soft tissue disorders: Secondary | ICD-10-CM | POA: Diagnosis not present

## 2021-05-18 DIAGNOSIS — D649 Anemia, unspecified: Secondary | ICD-10-CM | POA: Diagnosis not present

## 2021-05-18 DIAGNOSIS — Z452 Encounter for adjustment and management of vascular access device: Secondary | ICD-10-CM

## 2021-05-18 DIAGNOSIS — N493 Fournier gangrene: Secondary | ICD-10-CM | POA: Diagnosis present

## 2021-05-18 DIAGNOSIS — Z743 Need for continuous supervision: Secondary | ICD-10-CM | POA: Diagnosis not present

## 2021-05-18 DIAGNOSIS — Z86711 Personal history of pulmonary embolism: Secondary | ICD-10-CM

## 2021-05-18 DIAGNOSIS — R5381 Other malaise: Secondary | ICD-10-CM | POA: Diagnosis not present

## 2021-05-18 DIAGNOSIS — I5021 Acute systolic (congestive) heart failure: Secondary | ICD-10-CM | POA: Diagnosis not present

## 2021-05-18 DIAGNOSIS — Z20822 Contact with and (suspected) exposure to covid-19: Secondary | ICD-10-CM | POA: Diagnosis not present

## 2021-05-18 DIAGNOSIS — R109 Unspecified abdominal pain: Secondary | ICD-10-CM | POA: Diagnosis not present

## 2021-05-18 DIAGNOSIS — L0291 Cutaneous abscess, unspecified: Secondary | ICD-10-CM | POA: Diagnosis not present

## 2021-05-18 DIAGNOSIS — N35819 Other urethral stricture, male, unspecified site: Secondary | ICD-10-CM | POA: Diagnosis present

## 2021-05-18 DIAGNOSIS — I5033 Acute on chronic diastolic (congestive) heart failure: Secondary | ICD-10-CM | POA: Diagnosis present

## 2021-05-18 DIAGNOSIS — R6520 Severe sepsis without septic shock: Secondary | ICD-10-CM | POA: Diagnosis not present

## 2021-05-18 DIAGNOSIS — K611 Rectal abscess: Secondary | ICD-10-CM | POA: Diagnosis not present

## 2021-05-18 DIAGNOSIS — N179 Acute kidney failure, unspecified: Secondary | ICD-10-CM | POA: Diagnosis not present

## 2021-05-18 DIAGNOSIS — F17200 Nicotine dependence, unspecified, uncomplicated: Secondary | ICD-10-CM | POA: Diagnosis not present

## 2021-05-18 DIAGNOSIS — N5082 Scrotal pain: Secondary | ICD-10-CM

## 2021-05-18 DIAGNOSIS — Q5564 Hidden penis: Secondary | ICD-10-CM

## 2021-05-18 HISTORY — PX: INCISION AND DRAINAGE ABSCESS: SHX5864

## 2021-05-18 LAB — CBC
HCT: 31.2 % — ABNORMAL LOW (ref 39.0–52.0)
Hemoglobin: 11.7 g/dL — ABNORMAL LOW (ref 13.0–17.0)
MCH: 35 pg — ABNORMAL HIGH (ref 26.0–34.0)
MCHC: 37.5 g/dL — ABNORMAL HIGH (ref 30.0–36.0)
MCV: 93.4 fL (ref 80.0–100.0)
Platelets: 413 10*3/uL — ABNORMAL HIGH (ref 150–400)
RBC: 3.34 MIL/uL — ABNORMAL LOW (ref 4.22–5.81)
RDW: 15.9 % — ABNORMAL HIGH (ref 11.5–15.5)
WBC: 17.4 10*3/uL — ABNORMAL HIGH (ref 4.0–10.5)
nRBC: 0 % (ref 0.0–0.2)

## 2021-05-18 LAB — COMPREHENSIVE METABOLIC PANEL
ALT: 30 U/L (ref 0–44)
AST: 23 U/L (ref 15–41)
Albumin: 2.6 g/dL — ABNORMAL LOW (ref 3.5–5.0)
Alkaline Phosphatase: 79 U/L (ref 38–126)
Anion gap: 11 (ref 5–15)
BUN: 17 mg/dL (ref 8–23)
CO2: 30 mmol/L (ref 22–32)
Calcium: 8.1 mg/dL — ABNORMAL LOW (ref 8.9–10.3)
Chloride: 90 mmol/L — ABNORMAL LOW (ref 98–111)
Creatinine, Ser: 1.42 mg/dL — ABNORMAL HIGH (ref 0.61–1.24)
GFR, Estimated: 54 mL/min — ABNORMAL LOW (ref >60)
Glucose, Bld: 154 mg/dL — ABNORMAL HIGH (ref 70–99)
Potassium: 4.8 mmol/L (ref 3.5–5.1)
Sodium: 131 mmol/L — ABNORMAL LOW (ref 135–145)
Total Bilirubin: 1.6 mg/dL — ABNORMAL HIGH (ref 0.3–1.2)
Total Protein: 6.7 g/dL (ref 6.5–8.1)

## 2021-05-18 LAB — CBC WITH DIFFERENTIAL/PLATELET
Abs Immature Granulocytes: 0.08 10*3/uL — ABNORMAL HIGH (ref 0.00–0.07)
Basophils Absolute: 0 10*3/uL (ref 0.0–0.1)
Basophils Relative: 0 %
Eosinophils Absolute: 0.1 10*3/uL (ref 0.0–0.5)
Eosinophils Relative: 1 %
HCT: 37.9 % — ABNORMAL LOW (ref 39.0–52.0)
Hemoglobin: 12.7 g/dL — ABNORMAL LOW (ref 13.0–17.0)
Immature Granulocytes: 1 %
Lymphocytes Relative: 10 %
Lymphs Abs: 1.4 10*3/uL (ref 0.7–4.0)
MCH: 29.9 pg (ref 26.0–34.0)
MCHC: 33.5 g/dL (ref 30.0–36.0)
MCV: 89.2 fL (ref 80.0–100.0)
Monocytes Absolute: 1.3 10*3/uL — ABNORMAL HIGH (ref 0.1–1.0)
Monocytes Relative: 10 %
Neutro Abs: 11.1 10*3/uL — ABNORMAL HIGH (ref 1.7–7.7)
Neutrophils Relative %: 78 %
Platelets: 350 10*3/uL (ref 150–400)
RBC: 4.25 MIL/uL (ref 4.22–5.81)
RDW: 14.6 % (ref 11.5–15.5)
WBC: 14 10*3/uL — ABNORMAL HIGH (ref 4.0–10.5)
nRBC: 0 % (ref 0.0–0.2)

## 2021-05-18 LAB — RESP PANEL BY RT-PCR (FLU A&B, COVID) ARPGX2
Influenza A by PCR: NEGATIVE
Influenza B by PCR: NEGATIVE
SARS Coronavirus 2 by RT PCR: NEGATIVE

## 2021-05-18 LAB — LACTIC ACID, PLASMA: Lactic Acid, Venous: 2.5 mmol/L (ref 0.5–1.9)

## 2021-05-18 LAB — CBG MONITORING, ED: Glucose-Capillary: 172 mg/dL — ABNORMAL HIGH (ref 70–99)

## 2021-05-18 SURGERY — INCISION AND DRAINAGE, ABSCESS
Anesthesia: General

## 2021-05-18 MED ORDER — SODIUM CHLORIDE 0.9 % IV SOLN
INTRAVENOUS | Status: DC | PRN
  Administered 2021-05-18: 50 ug/min via INTRAVENOUS

## 2021-05-18 MED ORDER — LINEZOLID 600 MG/300ML IV SOLN
600.0000 mg | Freq: Two times a day (BID) | INTRAVENOUS | Status: DC
  Administered 2021-05-19 – 2021-05-21 (×7): 600 mg via INTRAVENOUS
  Filled 2021-05-18 (×9): qty 300

## 2021-05-18 MED ORDER — LIDOCAINE HCL (PF) 1 % IJ SOLN
INTRAMUSCULAR | Status: AC
  Filled 2021-05-18: qty 30

## 2021-05-18 MED ORDER — LIDOCAINE HCL (PF) 2 % IJ SOLN
INTRAMUSCULAR | Status: AC
  Filled 2021-05-18: qty 5

## 2021-05-18 MED ORDER — MIDAZOLAM HCL 2 MG/2ML IJ SOLN
INTRAMUSCULAR | Status: AC
  Filled 2021-05-18: qty 2

## 2021-05-18 MED ORDER — SODIUM CHLORIDE 0.9 % IV SOLN: INTRAVENOUS | Status: DC

## 2021-05-18 MED ORDER — FENTANYL CITRATE (PF) 100 MCG/2ML IJ SOLN
INTRAMUSCULAR | Status: AC
  Administered 2021-05-19: 25 ug via INTRAVENOUS
  Filled 2021-05-18: qty 2

## 2021-05-18 MED ORDER — EPHEDRINE SULFATE 50 MG/ML IJ SOLN
INTRAMUSCULAR | Status: DC | PRN
  Administered 2021-05-18 (×2): 10 mg via INTRAVENOUS

## 2021-05-18 MED ORDER — ROCURONIUM BROMIDE 10 MG/ML (PF) SYRINGE
PREFILLED_SYRINGE | INTRAVENOUS | Status: AC
  Filled 2021-05-18: qty 10

## 2021-05-18 MED ORDER — SUGAMMADEX SODIUM 500 MG/5ML IV SOLN
INTRAVENOUS | Status: DC | PRN
  Administered 2021-05-18: 400 mg via INTRAVENOUS

## 2021-05-18 MED ORDER — DAKINS (1/2 STRENGTH) 0.25 % EX SOLN
CUTANEOUS | Status: DC
  Filled 2021-05-18: qty 473

## 2021-05-18 MED ORDER — BUPIVACAINE HCL (PF) 0.5 % IJ SOLN
INTRAMUSCULAR | Status: AC
  Filled 2021-05-18: qty 30

## 2021-05-18 MED ORDER — PROPOFOL 500 MG/50ML IV EMUL
INTRAVENOUS | Status: AC
  Filled 2021-05-18: qty 50

## 2021-05-18 MED ORDER — DEXMEDETOMIDINE (PRECEDEX) IN NS 20 MCG/5ML (4 MCG/ML) IV SYRINGE
PREFILLED_SYRINGE | INTRAVENOUS | Status: DC | PRN
Start: 2021-05-18 — End: 2021-05-18
  Administered 2021-05-18: 12 ug via INTRAVENOUS

## 2021-05-18 MED ORDER — SUCCINYLCHOLINE CHLORIDE 20 MG/ML IJ SOLN
INTRAMUSCULAR | Status: DC | PRN
  Administered 2021-05-18: 200 mg via INTRAVENOUS

## 2021-05-18 MED ORDER — FENTANYL CITRATE (PF) 100 MCG/2ML IJ SOLN
25.0000 ug | INTRAMUSCULAR | Status: DC | PRN
  Administered 2021-05-19: 25 ug via INTRAVENOUS

## 2021-05-18 MED ORDER — LACTATED RINGERS IV SOLN: INTRAVENOUS | Status: DC | PRN

## 2021-05-18 MED ORDER — PHENYLEPHRINE HCL (PRESSORS) 10 MG/ML IV SOLN
INTRAVENOUS | Status: DC | PRN
  Administered 2021-05-18 (×2): 200 ug via INTRAVENOUS
  Administered 2021-05-18 (×2): 100 ug via INTRAVENOUS
  Administered 2021-05-18: 200 ug via INTRAVENOUS

## 2021-05-18 MED ORDER — ROCURONIUM BROMIDE 100 MG/10ML IV SOLN
INTRAVENOUS | Status: DC | PRN
  Administered 2021-05-18: 50 mg via INTRAVENOUS

## 2021-05-18 MED ORDER — ONDANSETRON HCL 4 MG/2ML IJ SOLN
INTRAMUSCULAR | Status: AC
  Filled 2021-05-18: qty 2

## 2021-05-18 MED ORDER — SODIUM CHLORIDE (PF) 0.9 % IJ SOLN
INTRAMUSCULAR | Status: AC
  Filled 2021-05-18: qty 20

## 2021-05-18 MED ORDER — DAKINS (1/2 STRENGTH) 0.25 % EX SOLN
CUTANEOUS | Status: DC | PRN
  Administered 2021-05-18: 1

## 2021-05-18 MED ORDER — SUCCINYLCHOLINE CHLORIDE 200 MG/10ML IV SOSY
PREFILLED_SYRINGE | INTRAVENOUS | Status: AC
  Filled 2021-05-18: qty 10

## 2021-05-18 MED ORDER — ONDANSETRON HCL 4 MG/2ML IJ SOLN: 4.0000 mg | Freq: Once | INTRAMUSCULAR | Status: DC | PRN

## 2021-05-18 MED ORDER — SODIUM CHLORIDE 0.9 % IV SOLN: 2.0000 g | Freq: Four times a day (QID) | INTRAVENOUS | Status: DC

## 2021-05-18 MED ORDER — SODIUM CHLORIDE 0.9 % IV SOLN
2.0000 g | Freq: Once | INTRAVENOUS | Status: AC
  Administered 2021-05-18: 2 g via INTRAVENOUS
  Filled 2021-05-18: qty 2000

## 2021-05-18 MED ORDER — FENTANYL CITRATE (PF) 100 MCG/2ML IJ SOLN
INTRAMUSCULAR | Status: DC | PRN
  Administered 2021-05-18 (×2): 50 ug via INTRAVENOUS

## 2021-05-18 MED ORDER — VASOPRESSIN 20 UNIT/ML IV SOLN
INTRAVENOUS | Status: DC | PRN
  Administered 2021-05-18 (×3): 1 [IU] via INTRAVENOUS
  Administered 2021-05-18: 2 [IU] via INTRAVENOUS
  Administered 2021-05-18 (×2): 1 [IU] via INTRAVENOUS

## 2021-05-18 MED ORDER — GENTAMICIN SULFATE 40 MG/ML IJ SOLN
2.0000 mg/kg | Freq: Once | INTRAVENOUS | Status: AC
  Administered 2021-05-18: 250 mg via INTRAVENOUS
  Filled 2021-05-18: qty 6.25

## 2021-05-18 MED ORDER — NON FORMULARY: Freq: Once | Status: DC

## 2021-05-18 MED ORDER — PANTOPRAZOLE SODIUM 40 MG IV SOLR
40.0000 mg | Freq: Every day | INTRAVENOUS | Status: DC
  Administered 2021-05-19 – 2021-05-22 (×5): 40 mg via INTRAVENOUS
  Filled 2021-05-18 (×5): qty 40

## 2021-05-18 MED ORDER — PIPERACILLIN-TAZOBACTAM 4.5 G IVPB
4.5000 g | Freq: Three times a day (TID) | INTRAVENOUS | Status: DC
  Administered 2021-05-19: 4.5 g via INTRAVENOUS
  Filled 2021-05-18 (×2): qty 100

## 2021-05-18 MED ORDER — FENTANYL CITRATE (PF) 100 MCG/2ML IJ SOLN
INTRAMUSCULAR | Status: AC
  Filled 2021-05-18: qty 2

## 2021-05-18 MED ORDER — LIDOCAINE HCL (CARDIAC) PF 100 MG/5ML IV SOSY
PREFILLED_SYRINGE | INTRAVENOUS | Status: DC | PRN
  Administered 2021-05-18: 80 mg via INTRAVENOUS

## 2021-05-18 MED ORDER — ONDANSETRON HCL 4 MG/2ML IJ SOLN
INTRAMUSCULAR | Status: DC | PRN
  Administered 2021-05-18: 4 mg via INTRAVENOUS

## 2021-05-18 MED ORDER — MIDAZOLAM HCL 2 MG/2ML IJ SOLN
INTRAMUSCULAR | Status: DC | PRN
  Administered 2021-05-18: 2 mg via INTRAVENOUS

## 2021-05-18 MED ORDER — PROPOFOL 10 MG/ML IV BOLUS
INTRAVENOUS | Status: DC | PRN
  Administered 2021-05-18: 150 mg via INTRAVENOUS
  Administered 2021-05-18: 50 mg via INTRAVENOUS

## 2021-05-18 MED ORDER — SUGAMMADEX SODIUM 500 MG/5ML IV SOLN
INTRAVENOUS | Status: AC
  Filled 2021-05-18: qty 5

## 2021-05-18 MED ORDER — EPHEDRINE 5 MG/ML INJ
INTRAVENOUS | Status: AC
  Filled 2021-05-18: qty 10

## 2021-05-18 SURGICAL SUPPLY — 57 items
BLADE SURG 15 STRL LF DISP TIS (BLADE) IMPLANT
BLADE SURG 15 STRL SS (BLADE) ×3
BNDG CONFORM 2 STRL LF (GAUZE/BANDAGES/DRESSINGS) ×1 IMPLANT
BNDG GAUZE ELAST 4 BULKY (GAUZE/BANDAGES/DRESSINGS) ×1 IMPLANT
BRIEF STRETCH FOR OB PAD XXL (UNDERPADS AND DIAPERS) ×1 IMPLANT
CANISTER SUCT 1200ML W/VALVE (MISCELLANEOUS) ×1 IMPLANT
CATH FOLEY 2W COUNCIL 5CC 16FR (CATHETERS) ×1 IMPLANT
CATH SET URETHRAL DILATOR (CATHETERS) ×1 IMPLANT
CHLORAPREP W/TINT 26 (MISCELLANEOUS) ×2 IMPLANT
CNTNR SPEC 2.5X3XGRAD LEK (MISCELLANEOUS) ×1
CONT SPEC 4OZ STER OR WHT (MISCELLANEOUS) ×1
CONTAINER SPEC 2.5X3XGRAD LEK (MISCELLANEOUS) IMPLANT
COVER WAND RF STERILE (DRAPES) ×2 IMPLANT
DERMABOND ADVANCED (GAUZE/BANDAGES/DRESSINGS)
DERMABOND ADVANCED .7 DNX12 (GAUZE/BANDAGES/DRESSINGS) ×1 IMPLANT
DRAIN PENROSE 12X.25 LTX STRL (MISCELLANEOUS) ×1 IMPLANT
DRAPE LAPAROTOMY 77X122 PED (DRAPES) ×2 IMPLANT
DRSG GAUZE FLUFF 36X18 (GAUZE/BANDAGES/DRESSINGS) ×2 IMPLANT
ELECT BLADE 6.5 EXT (BLADE) ×1 IMPLANT
ELECT CAUTERY BLADE 6.4 (BLADE) ×1 IMPLANT
ELECT REM PT RETURN 9FT ADLT (ELECTROSURGICAL) ×2
ELECTRODE REM PT RTRN 9FT ADLT (ELECTROSURGICAL) ×1 IMPLANT
GAUZE SPONGE 4X4 12PLY STRL (GAUZE/BANDAGES/DRESSINGS) ×2 IMPLANT
GLOVE SURG ENC MOIS LTX SZ6.5 (GLOVE) ×3 IMPLANT
GLOVE SURG ENC MOIS LTX SZ7 (GLOVE) ×3 IMPLANT
GOWN STRL REUS W/ TWL LRG LVL3 (GOWN DISPOSABLE) ×2 IMPLANT
GOWN STRL REUS W/TWL LRG LVL3 (GOWN DISPOSABLE) ×2
GUIDEWIRE STR DUAL SENSOR (WIRE) ×1 IMPLANT
KIT TURNOVER KIT A (KITS) ×2 IMPLANT
LABEL OR SOLS (LABEL) ×1 IMPLANT
MANIFOLD NEPTUNE II (INSTRUMENTS) ×2 IMPLANT
NDL HYPO 25X1 1.5 SAFETY (NEEDLE) ×1 IMPLANT
NEEDLE HYPO 25X1 1.5 SAFETY (NEEDLE) IMPLANT
NS IRRIG 500ML POUR BTL (IV SOLUTION) ×3 IMPLANT
PACK BASIN MINOR ARMC (MISCELLANEOUS) ×2 IMPLANT
PAD ABD DERMACEA PRESS 5X9 (GAUZE/BANDAGES/DRESSINGS) ×1 IMPLANT
SET CYSTO W/LG BORE CLAMP LF (SET/KITS/TRAYS/PACK) ×1 IMPLANT
SOL PREP PVP 2OZ (MISCELLANEOUS)
SOLUTION PREP PVP 2OZ (MISCELLANEOUS) ×1 IMPLANT
SPONGE T-LAP 18X18 ~~LOC~~+RFID (SPONGE) ×4 IMPLANT
STAPLER SKIN PROX 35W (STAPLE) ×1 IMPLANT
SUPPORETR ATHLETIC LG (MISCELLANEOUS) ×1 IMPLANT
SUPPORTER ATHLETIC LG (MISCELLANEOUS)
SURGILUBE 2OZ TUBE FLIPTOP (MISCELLANEOUS) ×1 IMPLANT
SUT ETHILON 3-0 FS-10 30 BLK (SUTURE)
SUT VIC AB 2-0 SH 27 (SUTURE) ×2
SUT VIC AB 2-0 SH 27XBRD (SUTURE) IMPLANT
SUT VIC AB 3-0 SH 27 (SUTURE)
SUT VIC AB 3-0 SH 27X BRD (SUTURE) ×2 IMPLANT
SUT VIC AB 4-0 SH 27 (SUTURE)
SUT VIC AB 4-0 SH 27XANBCTRL (SUTURE) ×1 IMPLANT
SUTURE EHLN 3-0 FS-10 30 BLK (SUTURE) ×1 IMPLANT
SWAB CULTURE AMIES ANAERIB BLU (MISCELLANEOUS) ×1 IMPLANT
SYR 10ML LL (SYRINGE) ×1 IMPLANT
SYR BULB IRRIG 60ML STRL (SYRINGE) ×1 IMPLANT
TRAY FOLEY MTR SLVR 16FR STAT (SET/KITS/TRAYS/PACK) ×1 IMPLANT
VALVE UROSEAL ADJ ENDO (VALVE) ×1 IMPLANT

## 2021-05-18 NOTE — Consult Note (Signed)
05/18/21 8:50 PM   Thomas Tanner [birth year] [id number]  CC: Fournier's gangrene  HPI: Extremely comorbid 70 year old male with history of morbid obesity (BMI 64 ), CHF, COPD on 3.5 L home oxygen at baseline, saddle pulmonary embolus in August 2021 on Eliquis, CAD, hypertension who presents with abdominal pain and scrotal pain.  He was worked up in the ED with a scrotal ultrasound and CT that showed extensive gas in the scrotum consistent with Fournier's gangrene, notes extensive purulent drainage from the midline of the scrotum on exam.    He is afebrile with blood pressure 94/48, respirations 29, heart rate 89.  Lab work notable for leukocytosis to 12.6k, normal renal function with creatinine 1.06, EGFR greater than 60, platelets 343.  Last dose of Eliquis was this morning.  COVID test 6/5 was negative.   PMH: Past Medical History:  Diagnosis Date   Arthritis    CHF (congestive heart failure) (HCC)    COPD (chronic obstructive pulmonary disease) (HCC)    Diabetes (HCC)    Hyperlipidemia    Hypertension    Kidney stone    Obesity    Tachycardia    TIA (transient ischemic attack)     Surgical History: Past Surgical History:  Procedure Laterality Date   CORONARY/GRAFT ACUTE MI REVASCULARIZATION N/A 12/08/2018   Procedure: Coronary/Graft Acute MI Revascularization;  Surgeon: Yolonda Kida, MD;  Location: [place] [facility];  Service: Cardiovascular;  Laterality: N/A;   KIDNEY STONE SURGERY     left arm surgery  1963   LEFT HEART CATH AND CORONARY ANGIOGRAPHY N/A 12/08/2018   Procedure: LEFT HEART CATH AND CORONARY ANGIOGRAPHY;  Surgeon: Yolonda Kida, MD;  Location: [place] [facility];  Service: Cardiovascular;  Laterality: N/A;   PULMONARY THROMBECTOMY N/A 08/03/2020   Procedure: PULMONARY THROMBECTOMY / THROMBOLYSIS;  Surgeon: Algernon Huxley, MD;  Location: [place] [facility];  Service: Cardiovascular;  Laterality: N/A;      Family  History: Family History  Problem Relation Age of Onset   Alzheimer's disease Mother    CAD Father     Social History:  reports that he has been smoking cigarettes. He has been smoking an average of 0.50 packs per day. He has never used smokeless tobacco. He reports that he does not drink alcohol and does not use drugs.  Physical Exam: BP (!) 94/48 (BP Location: Right Wrist)   Pulse 89   Temp 98 F (36.7 C) (Oral)   Resp (!) 29   Ht 6' 1"  (1.854 m)   Wt (!) 191 kg   SpO2 97%   BMI 55.54 kg/m    Constitutional: Morbidly obese, alert and conversational Cardiovascular: Regular rate and rhythm Respiratory: Diminished bilaterally secondary to obesity, on 3 L of oxygen GI: Obese, soft, nontender GU: Buried penis, frankly purulent foul-smelling drainage from the midline of the scrotum, extremely tender, crepitus, exam limited by obesity and pain Neurologic: Grossly intact, no focal deficits, moving all 4 extremities. Psychiatric: Normal mood and affect.  Laboratory Data: Reviewed, see HPI  Pertinent Imaging: I have personally viewed and interpreted the CT pelvis with extensive gas in the scrotum tracking up the left inguinal canal consistent with Fournier's gangrene  Assessment & Plan:   70 year old extremely comorbid male with numerous comorbidities who presents with Fournier's gangrene.  We discussed the need for emergent debridement and exploration, with removal of dead tissue, need for wound packing and long-term wound care with healing by secondary intention or wound VAC.  Risks of bleeding, infection, recurrence, need for additional debridements, sepsis, death discussed at length.  Ampicillin/gentamicin for antibiotics Will discuss timing for resuming Eliquis tomorrow morning with hospitalist OR tonight for emergent scrotal exploration and drainage   Nickolas Madrid, MD 05/18/2021  [facility] [address], [place], [place]  [address] [phone]

## 2021-05-18 NOTE — ED Notes (Signed)
Stickers and consent form provided to orderly to transport patient to OR.

## 2021-05-18 NOTE — ED Notes (Signed)
Report to OR RN.

## 2021-05-18 NOTE — ED Notes (Signed)
Blood cultures obtained by Mimi, RN and Georgiann Hahn, ED NT at this time.

## 2021-05-18 NOTE — Transfer of Care (Signed)
Immediate Anesthesia Transfer of Care Note  Patient: Thomas Tanner  Procedure(s) Performed: INCISION AND DRAINAGE SCROTAL ABSCESS, cystoscopy with urethral dilation, and complex catheter placement  Patient Location: PACU  Anesthesia Type:General  Level of Consciousness: awake  Airway & Oxygen Therapy: Patient Spontanous Breathing and Patient connected to face mask oxygen  Post-op Assessment: Post -op Vital signs reviewed and stable  Post vital signs: Reviewed and stable  Last Vitals:  Vitals Value Taken Time  BP    Temp    Pulse 100   Resp 14   SpO2 99     Last Pain:  Vitals:   05/18/21 2007  TempSrc: Oral  PainSc:          Complications: No notable events documented.

## 2021-05-18 NOTE — Op Note (Signed)
Date of procedure: 05/18/21  Preoperative diagnosis:  Fournier's gangrene, necrotizing perineal infection  Postoperative diagnosis:  Same  Procedure: Cystoscopy, urethral dilation of urethral stricture, complex catheter placement Scrotal exploration and debridement of skin, subcutaneous tissue, muscle, and fascia(cpt 11006)  Surgeon: Legrand Rams, MD  Anesthesia: General  Complications: None  Intraoperative findings:  Unable to pass catheter secondary to completely buried penis.  On cystoscopy the entire urethra appeared to be pan strictured to about 10 [place].  Dilated with Heyman dilators and 18 [place] council placed with return of yellow urine. >118mL purulent drainage from scrotum, challenging hemostasis secondary to Eliquis  EBL: 750 mL  Specimens: Tissue for aerobic and anaerobic culture  Drains: 18 French council Foley  Indication: Thomas Tanner is a 70 y.o. patient with a number of comorbidities including morbid obesity, diabetes, history of PE on Eliquis who presented with scrotal pain and was found to have extensive gas in the scrotum on CT and physical exam consistent with Fournier's gangrene.  After reviewing the management options for treatment, they elected to proceed with the above surgical procedure(s). We have discussed the potential benefits and risks of the procedure, side effects of the proposed treatment, the likelihood of the patient achieving the goals of the procedure, and any potential problems that might occur during the procedure or recuperation. Informed consent has been obtained.  Description of procedure:  The patient was taken to the operating room and general anesthesia was induced. The patient was placed in the dorsal lithotomy position, prepped and draped in the usual sterile fashion, and preoperative antibiotics(ampicillin and gentamicin) were administered. A preoperative time-out was performed.   On exam, the penis was completely  buried.  The scrotum was edematous and purpleish black with a 5 mm opening in the mid superior scrotum with foul-smelling drainage.  I attempted to pass the catheter, but could not visualize the meatus secondary to his morbid obesity and buried penis with scrotal edema.  The flexible cystoscope was used to intubate the urethra and I was able to identify the meatus and advanced the camera into the meatus.  Unfortunately the entire urethra appeared to be pan stricture to about 10 [place].  A sensor wire passed through the urethra and into the bladder. Heyman dilators from 10 [place] through 22 [place] passed easily over the wire, and an 18 Psychiatrist was placed with return of clear yellow urine.  10 cc were placed in the balloon and the catheter was connected to drainage.  I started by using a 15 blade to make an incision from the area of drainage in the mid scrotum this was carried all the way down to the perineum, then a few centimeters further anteriorly.  There was frank drainage of foul-smelling bloody and purulent material.  Blunt dissection was used to open all pockets posteriorly as well as anteriorly up alongside the left spermatic cord.  The left testicle appeared viable.  There was an extensive amount of gas and necrotic tissue, muscle, and fascia.  This was debrided sharply and with electrocautery.  There was a fair amount of bleeding secondary to his anticoagulation with Eliquis.  Once I felt all necrotic material had been debrided and all pockets bluntly broken up, I started to meticulously achieve hemostasis.  Multiple 2-0 Vicryl figure-of-eight stitches were used for hemostasis, as well as electrocautery.  This was challenging secondary to his morbid obesity, depth of the cavity, and anticoagulation with Eliquis.    Once I felt adequate hemostasis was  achieved to the best of our ability with his anticoagulation, the wound was tightly packed with a total of 3 rolls of dampened Kerlix that  had been soaked in Dakins solution.  ABDs were applied and secured with tape, and mesh pants were applied.  Disposition: Stable to PACU  Plan: Extubated to PACU Check CBC Maintain Foley at least 1 week Will reassess wound tomorrow afternoon  Legrand Rams, MD

## 2021-05-18 NOTE — Anesthesia Procedure Notes (Signed)
Procedure Name: Intubation Date/Time: 05/18/2021 9:08 PM Performed by: Irving Burton, CRNA Pre-anesthesia Checklist: Patient identified, Patient being monitored, Timeout performed, Emergency Drugs available and Suction available Patient Re-evaluated:Patient Re-evaluated prior to induction Oxygen Delivery Method: Circle system utilized Preoxygenation: Pre-oxygenation with 100% oxygen Induction Type: IV induction and Rapid sequence Ventilation: Mask ventilation without difficulty Laryngoscope Size: McGraph and 4 Grade View: Grade I Tube type: Oral Tube size: 7.5 mm Number of attempts: 1 Airway Equipment and Method: Stylet and Video-laryngoscopy Placement Confirmation: ETT inserted through vocal cords under direct vision, positive ETCO2 and breath sounds checked- equal and bilateral Secured at: 23 cm Tube secured with: Tape Dental Injury: Teeth and Oropharynx as per pre-operative assessment

## 2021-05-18 NOTE — H&P (View-Only) (Signed)
05/18/21 8:50 PM   Thomas Tanner 1951/07/30 [id number]  CC: Fournier's gangrene  HPI: Extremely comorbid 70 year old male with history of morbid obesity (BMI 2 ), CHF, COPD on 3.5 L home oxygen at baseline, saddle pulmonary embolus in August 2021 on Eliquis, CAD, hypertension who presents with abdominal pain and scrotal pain.  He was worked up in the ED with a scrotal ultrasound and CT that showed extensive gas in the scrotum consistent with Fournier's gangrene, notes extensive purulent drainage from the midline of the scrotum on exam.    He is afebrile with blood pressure 94/48, respirations 29, heart rate 89.  Lab work notable for leukocytosis to 12.6k, normal renal function with creatinine 1.06, EGFR greater than 60, platelets 343.  Last dose of Eliquis was this morning.  COVID test 6/5 was negative.   PMH: Past Medical History:  Diagnosis Date   Arthritis    CHF (congestive heart failure) (HCC)    COPD (chronic obstructive pulmonary disease) (HCC)    Diabetes (HCC)    Hyperlipidemia    Hypertension    Kidney stone    Obesity    Tachycardia    TIA (transient ischemic attack)     Surgical History: Past Surgical History:  Procedure Laterality Date   CORONARY/GRAFT ACUTE MI REVASCULARIZATION N/A 12/08/2018   Procedure: Coronary/Graft Acute MI Revascularization;  Surgeon: Yolonda Kida, MD;  Location: [place] [facility];  Service: Cardiovascular;  Laterality: N/A;   KIDNEY STONE SURGERY     left arm surgery  1963   LEFT HEART CATH AND CORONARY ANGIOGRAPHY N/A 12/08/2018   Procedure: LEFT HEART CATH AND CORONARY ANGIOGRAPHY;  Surgeon: Yolonda Kida, MD;  Location: [place] [facility];  Service: Cardiovascular;  Laterality: N/A;   PULMONARY THROMBECTOMY N/A 08/03/2020   Procedure: PULMONARY THROMBECTOMY / THROMBOLYSIS;  Surgeon: Algernon Huxley, MD;  Location: [place] [facility];  Service: Cardiovascular;  Laterality: N/A;      Family  History: Family History  Problem Relation Age of Onset   Alzheimer's disease Mother    CAD Father     Social History:  reports that he has been smoking cigarettes. He has been smoking an average of 0.50 packs per day. He has never used smokeless tobacco. He reports that he does not drink alcohol and does not use drugs.  Physical Exam: BP (!) 94/48 (BP Location: Right Wrist)   Pulse 89   Temp 98 F (36.7 C) (Oral)   Resp (!) 29   Ht 6' 1"  (1.854 m)   Wt (!) 191 kg   SpO2 97%   BMI 55.54 kg/m    Constitutional: Morbidly obese, alert and conversational Cardiovascular: Regular rate and rhythm Respiratory: Diminished bilaterally secondary to obesity, on 3 L of oxygen GI: Obese, soft, nontender GU: Buried penis, frankly purulent foul-smelling drainage from the midline of the scrotum, extremely tender, crepitus, exam limited by obesity and pain Neurologic: Grossly intact, no focal deficits, moving all 4 extremities. Psychiatric: Normal mood and affect.  Laboratory Data: Reviewed, see HPI  Pertinent Imaging: I have personally viewed and interpreted the CT pelvis with extensive gas in the scrotum tracking up the left inguinal canal consistent with Fournier's gangrene  Assessment & Plan:   70 year old extremely comorbid male with numerous comorbidities who presents with Fournier's gangrene.  We discussed the need for emergent debridement and exploration, with removal of dead tissue, need for wound packing and long-term wound care with healing by secondary intention or wound VAC.  Risks of bleeding, infection, recurrence, need for additional debridements, sepsis, death discussed at length.  Ampicillin/gentamicin for antibiotics Will discuss timing for resuming Eliquis tomorrow morning with hospitalist OR tonight for emergent scrotal exploration and drainage   Nickolas Madrid, MD 05/18/2021  [name] [address] [place], [place]  [address] [phone]

## 2021-05-18 NOTE — ED Notes (Signed)
ED Provider at bedside. 

## 2021-05-18 NOTE — ED Notes (Signed)
Patient is resting comfortably. Difficulty obtaining blood pressure. This RN swapped out cuff and attempted again. Provider at bedside.

## 2021-05-18 NOTE — H&P (Addendum)
Thomas Tanner WNI:[id number] DOB: 09-Feb-1951 DOA: 05/18/2021     PCP: Luiz Ochoa, NP (Inactive)      Patient arrived to ER on 05/18/21 at 1547 Referred by Attending Nance Pear, MD   Patient coming from:  From facility [facility]  Chief Complaint:  Chief Complaint  Patient presents with   Constipation    HPI: Thomas Tanner is a 70 y.o. male with medical history significant of    COPD on 3.5 L o2, HTn, CAD, tobacco abuse, diastolic CHF, TIA  Presented with  constipation given enema today noted some bowel leakage. After his recent discharge patient was started on MiraLAX and Senokot as well as had an enema.  Denied any blood in stool Pain is severe felt like he was tearing. Patient reported that he thought he is an abscess on his scrotum Patient was recently admitted to the hospital with significant constipation and Bacteroides bacteremia severe scrotal edema felt to be low suspicion for cellulitis Initially was treated with Rocephin for bacteremia changed to Unasyn and transition to Augmentin at discharge on 7 June.  While hospitalized he was treated for COPD exacerbation with azithromycin and prednisone.      Initial COVID TEST  NEGATIVE   Lab Results  Component Value Date   SARSCOV2NAA NEGATIVE 05/18/2021   [place] NEGATIVE 05/14/2021   [place] NEGATIVE 05/09/2021   [place] NEGATIVE 05/05/2021     Regarding pertinent Chronic problems:    Hyperlipidemia - on statins Rosuvostatin Lipid Panel     Component Value Date/Time   CHOL 163 12/08/2018 1138   CHOL 200 06/11/2013 0404   TRIG 121 12/08/2018 1138   TRIG 135 06/11/2013 0404   HDL 34 (L) 12/08/2018 1138   HDL 40 06/11/2013 0404   CHOLHDL 4.8 12/08/2018 1138   VLDL 24 12/08/2018 1138   VLDL 27 06/11/2013 0404   LDLCALC 105 (H) 12/08/2018 1138   LDLCALC 133 (H) 06/11/2013 0404     HTN on metoprolol. 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 Urology was made aware   ED Triage Vitals  Enc Vitals Group     BP 05/18/21 1617 112/88     Pulse Rate 05/18/21 1558 93     Resp 05/18/21 1558 20     Temp 05/18/21 1602 97.9 F (36.6 C)     Temp Source 05/18/21 1602 Oral     SpO2 05/18/21 1558 95 %     Weight 05/18/21 1600 (!) 421 lb (191 kg)     Height 05/18/21 1600 6' 1" (1.854 m)     Head Circumference --      Peak Flow --      Pain Score 05/18/21 1559 5     Pain Loc --      Pain Edu? --      Excl. in [place]? --   TMAX(24)@     _________________________________________ Significant initial  Findings: Abnormal Labs Reviewed - No abnormal labs to display ____________________________________________  Ordered   KUB Large stool burden without bowel obstruction.   CTabd/pelvis -  Large volume of gas interspersed within a large LEFT hydrocele. Findings consistent with a gas-forming bacteria infection of the LEFT hemiscrotum.    US scrotal -  Marked soft tissue swelling and edema in the scrotum with multiple new foci of bright shadowing in the scrotum especially on RIGHT. Findings are highly suspicious for foci of gas and presence of Fournier's gangrene. This could be confirmed by CT imaging. No large discrete drainable fluid collection identified.     ____________________ This patient meets SIRS Criteria and may be septic.    The recent clinical data is shown below. Vitals:   05/18/21 1602 05/18/21 1617 05/18/21 1618 05/18/21 2007  BP:  112/88  (!) 94/48  Pulse:   91 89  Resp:    (!) 29  Temp: 97.9 F (36.6 C)   98 F (36.7 C)  TempSrc: Oral    Oral  SpO2:   99% 97%  Weight:      Height:         WBC     Component Value Date/Time   WBC 12.6 (H) 05/14/2021 0752   LYMPHSABS 1.2 05/14/2021 0752   LYMPHSABS 2.5 06/11/2013 0404   MONOABS 1.3 (H) 05/14/2021 0752   MONOABS 0.7 06/11/2013 0404   EOSABS 0.1 05/14/2021 0752   EOSABS 0.3 06/11/2013 0404   BASOSABS 0.0 05/14/2021 0752   BASOSABS 0.1 06/11/2013 0404      Lactic Acid, Venous    Component Value Date/Time   LATICACIDVEN 0.9 05/05/2021 1431     Procalcitonin Ordered Lactic Acid, Venous    Component Value Date/Time   LATICACIDVEN 0.9 05/05/2021 1431       UA  ordered     Results for orders placed or performed during the hospital encounter of 05/18/21  Resp Panel by RT-PCR (Flu A&B, Covid) Nasopharyngeal Swab     Status: None   Collection Time: 05/18/21  8:23 PM   Specimen: Nasopharyngeal Swab; Nasopharyngeal(NP) swabs in vial transport medium  Result Value Ref Range Status   SARS Coronavirus 2 by RT PCR NEGATIVE NEGATIVE Final         Influenza A by PCR NEGATIVE NEGATIVE Final   Influenza B by PCR NEGATIVE NEGATIVE Final           _______________________________________________________ ER Provider Called:   Urology  Dr. Diamantina Providence They Recommend admit to medicine    SEEN in ER _______________________________________________ Hospitalist was called for admission for  Fournier's gangrene.  The following Work up has been ordered so far:  Orders Placed This Encounter  Procedures   Culture, blood (routine x 2)   Resp Panel by RT-PCR (Flu A&B, Covid) Nasopharyngeal Swab   DG Abdomen 1 View   US SCROTUM W/DOPPLER   CT PELVIS WO CONTRAST   Comprehensive metabolic panel   CBC with Differential   Lactic acid, plasma   Informed Consent Details: Physician/Practitioner Attestation; Transcribe to consent form and obtain patient signature   Place and maintain sequential compression device   Consult to hospitalist   Airborne and Contact precautions      Following Medications were ordered in ER: Medications  ampicillin (OMNIPEN) 2 g in sodium chloride 0.9 % 100 mL IVPB (has no administration in time range)  gentamicin (GARAMYCIN) 250 mg in dextrose 5 % 50 mL IVPB (has no administration in time range)        Consult Orders  (From admission, onward)  Start     Ordered   05/18/21 2000  Consult to hospitalist  Once       Provider:  (Not yet assigned)  Question Answer Comment  Place call to: [phone]   Reason for Consult Admit   Diagnosis/Clinical Info for Consult: Fourniers gangrene. Urology to take to OR tonight      05/18/21 2001             OTHER Significant initial  Findings:  labs showing:    Recent Labs  Lab 05/12/21 0509 05/14/21 0752  NA 133* 134*  K 4.1 3.7  CO2 29 31  GLUCOSE 99 137*  BUN 27* 20  CREATININE 1.31* 1.06  CALCIUM 8.6* 8.6*  MG 2.5*  --     Cr   stable,   Lab Results  Component Value Date   CREATININE 1.06 05/14/2021   CREATININE 1.31 (H) 05/12/2021   CREATININE 1.08 05/11/2021    No results for input(s): AST, ALT, ALKPHOS, BILITOT, PROT, ALBUMIN in the last 168 hours. Lab Results  Component Value Date   CALCIUM 8.6 (L) 05/14/2021          Plt: Lab Results  Component Value Date   PLT 343 05/14/2021       COVID-19 Labs  No results for input(s): DDIMER, FERRITIN, LDH, CRP in the last 72 hours.  Lab Results  Component Value Date   SARSCOV2NAA NEGATIVE 05/14/2021   SARSCOV2NAA NEGATIVE 05/09/2021   SARSCOV2NAA NEGATIVE 05/05/2021   SARSCOV2NAA NEGATIVE 08/02/2020         Recent Labs  Lab 05/12/21 0509 05/14/21 0752  WBC 17.6* 12.6*  NEUTROABS 14.7* 9.8*  HGB 12.9* 12.6*  HCT 37.2* 35.2*  MCV 92.1 93.4  PLT 324 343    HG/HCT * stable,  Down *Up from baseline see below    Component Value Date/Time   HGB 12.6 (L) 05/14/2021 0752   HGB 14.6 06/11/2013 0404   HCT 35.2 (L) 05/14/2021 0752   HCT 42.1 06/11/2013 0404   MCV 93.4 05/14/2021 0752    MCV 92 06/11/2013 0404     BNP (last 3 results) Recent Labs    08/02/20 1107 08/03/20 2038 05/05/21 0851  BNP 503.4* 218.8* 81.8      DM  labs:  HbA1C: Recent Labs    05/27/20 0546  HGBA1C 6.4*           Cultures:    Component Value Date/Time   SDES BLOOD BLOOD RIGHT HAND 05/08/2021 0451   SDES BLOOD RIGHT ANTECUBITAL 05/08/2021 0451   SPECREQUEST  05/08/2021 0451    BOTTLES DRAWN AEROBIC AND ANAEROBIC Blood Culture adequate volume   SPECREQUEST  05/08/2021 0451    BOTTLES DRAWN AEROBIC AND ANAEROBIC Blood Culture adequate volume   CULT  05/08/2021 0451    NO GROWTH 5 DAYS Performed at [facility], [address] [place], [place] [address]    CULT  05/08/2021 0451    NO GROWTH 5 DAYS Performed at [facility], [address] [place], [place] [address]    REPTSTATUS 05/13/2021 FINAL 05/08/2021 0451   REPTSTATUS 05/13/2021 FINAL 05/08/2021 0451     Radiological Exams on Admission: DG Abdomen 1 View  Result Date: 05/18/2021 CLINICAL DATA:  Constipation. EXAM: ABDOMEN - 1 VIEW COMPARISON:  None. FINDINGS: The bowel gas pattern is normal. A large amount of stool is seen within the ascending colon. No radio-opaque calculi or other significant radiographic abnormality are seen. Marked severity chronic and degenerative changes are  seen involving the right hip. IMPRESSION: Large stool burden without bowel obstruction. Electronically Signed   By: Virgina Norfolk M.D.   On: 05/18/2021 17:37   CT PELVIS WO CONTRAST  Result Date: 05/18/2021 CLINICAL DATA:  Concern for gas in the scrotum on ultrasound. Scrotal pain for 5 weeks. Draining wound EXAM: CT PELVIS WITHOUT CONTRAST TECHNIQUE: Multidetector CT imaging of the pelvis was performed following the standard protocol without intravenous contrast. COMPARISON:  None. FINDINGS: Urinary Tract:  Distal ureters and bladder normal. Bowel: No evidence of constipation. Normal stool volume. Factors limited stool  in the sigmoid colon rectum. Vascular/Lymphatic: Calcification abdominal aorta. Pelvic lymphadenopathy. Reproductive:  Prostate is normal. There is gas with LEFT hemiscrotum. There is a large amount of gas interspersed within a LEFT hydrocele. The testicle is suspended within this large volume of gas and fluid. (Image 190/series 3). Gas extends along the inguinal canal to level the base of the penis (image 150/series 3.) Additionally there is gas within the perineum midline (image 153/3. There is gas extends to the base of the penis (image 151/3. Other:  No free fluid or abscess in the peritoneal space. Musculoskeletal: No evidence of osteomyelitis. Severe cystic degenerative change of the RIGHT hip joint. IMPRESSION: 1. Large volume of gas interspersed within a large LEFT hydrocele. Findings consistent with a gas-forming bacteria infection of the LEFT hemiscrotum. 2. Gas extends superiorly from the LEFT hemiscrotum through the LEFT inguinal canal to level the base of the penis. 3. Additionally gas extends into the midline perineum to level of the base of the penis. 4. Findings consistent with Fournier's gangrene. Recommend emergent urologic surgical consultation. Findings conveyed toJONATHAN CUTHRIELL on 05/18/2021  at20:17. Electronically Signed   By: Suzy Bouchard M.D.   On: 05/18/2021 20:17   US SCROTUM W/DOPPLER  Result Date: 05/18/2021 CLINICAL DATA:  Scrotal pain for 5 weeks, wound draining at RIGHT hemiscrotum EXAM: SCROTAL ULTRASOUND DOPPLER ULTRASOUND OF THE TESTICLES TECHNIQUE: Complete ultrasound examination of the testicles, epididymis, and other scrotal structures was performed. Color and spectral Doppler ultrasound were also utilized to evaluate blood flow to the testicles. COMPARISON:  None FINDINGS: Right testicle Measurements: 3.8 x 2.8 x 3.0 cm. Normal echogenicity without mass or calcification. Internal blood flow present on color Doppler imaging. Left testicle Measurements: 4.1 x 2.2 x 2.5  cm. Normal echogenicity without mass or calcification. Internal blood flow present on color Doppler imaging. Right epididymis:  Normal in size and appearance. Left epididymis:  Normal in size and appearance. Hydrocele:  Trace RIGHT hydrocele Varicocele:  None visualized. Pulsed Doppler interrogation of both testes demonstrates normal low resistance arterial and venous waveforms bilaterally. Marked soft tissue swelling in the scrotum bilaterally. Multiple new tiny foci of bright echogenicity new since previous study which cause scattered shadowing, highly suspicious for soft tissue gas and question Fournier's gangrene. Scattered areas of edema/fluid within tissue planes. IMPRESSION: Normal appearing testes and epididymi. Marked soft tissue swelling and edema in the scrotum with multiple new foci of bright shadowing in the scrotum especially on RIGHT. Findings are highly suspicious for foci of gas and presence of Fournier's gangrene. This could be confirmed by CT imaging. No large discrete drainable fluid collection identified. Critical Value/emergent results were called by telephone at the time of interpretation on 05/18/2021 at 6:51 pm to provider [facility] PA, who verbally acknowledged these results. Electronically Signed   By: Lavonia Dana M.D.   On: 05/18/2021 18:51   _______________________________________________________________________________________________________ Latest  Blood pressure (!) 94/48, pulse 89,  temperature 98 F (36.7 C), temperature source Oral, resp. rate (!) 29, height 6' 1" (1.854 m), weight (!) 191 kg, SpO2 97 %.   Review of Systems:    Pertinent positives include: fatigue, contipation  Constitutional:  No weight loss, night sweats, Fevers, chills,  weight loss  HEENT:  No headaches, Difficulty swallowing,Tooth/dental problems,Sore throat,  No sneezing, itching, ear ache, nasal congestion, post nasal drip,  Cardio-vascular:  No chest pain, Orthopnea, PND, anasarca,  dizziness, palpitations.no Bilateral lower extremity swelling  GI:  No heartburn, indigestion, abdominal pain, nausea, vomiting, diarrhea, change in bowel habits, loss of appetite, melena, blood in stool, hematemesis Resp:  no shortness of breath at rest. No dyspnea on exertion, No excess mucus, no productive cough, No non-productive cough, No coughing up of blood.No change in color of mucus.No wheezing. Skin:  no rash or lesions. No jaundice GU:  no dysuria, change in color of urine, no urgency or frequency. No straining to urinate.  No flank pain.  Musculoskeletal:  No joint pain or no joint swelling. No decreased range of motion. No back pain.  Psych:  No change in mood or affect. No depression or anxiety. No memory loss.  Neuro: no localizing neurological complaints, no tingling, no weakness, no double vision, no gait abnormality, no slurred speech, no confusion  All systems reviewed and apart from [place] all are negative _______________________________________________________________________________________________ Past Medical History:   Past Medical History:  Diagnosis Date   Arthritis    CHF (congestive heart failure) (HCC)    COPD (chronic obstructive pulmonary disease) (HCC)    Diabetes (HCC)    Hyperlipidemia    Hypertension    Kidney stone    Obesity    Tachycardia    TIA (transient ischemic attack)       Past Surgical History:  Procedure Laterality Date   CORONARY/GRAFT ACUTE MI REVASCULARIZATION N/A 12/08/2018   Procedure: Coronary/Graft Acute MI Revascularization;  Surgeon: Yolonda Kida, MD;  Location: [place];  Service: Cardiovascular;  Laterality: N/A;   KIDNEY STONE SURGERY     left arm surgery  1963   LEFT HEART CATH AND CORONARY ANGIOGRAPHY N/A 12/08/2018   Procedure: LEFT HEART CATH AND CORONARY ANGIOGRAPHY;  Surgeon: Yolonda Kida, MD;  Location: [facility];  Service: Cardiovascular;  Laterality: N/A;   PULMONARY  THROMBECTOMY N/A 08/03/2020   Procedure: PULMONARY THROMBECTOMY / THROMBOLYSIS;  Surgeon: Algernon Huxley, MD;  Location: [place];  Service: Cardiovascular;  Laterality: N/A;    Social History:  Ambulatory  bed bound     reports that he has been smoking cigarettes. He has been smoking an average of 0.50 packs per day. He has never used smokeless tobacco. He reports that he does not drink alcohol and does not use drugs.    Family History:   Family History  Problem Relation Age of Onset   Alzheimer's disease Mother    CAD Father    ______________________________________________________________________________________________ Allergies: Allergies  Allergen Reactions   Morphine Anxiety and Other (See Comments)    Hallucinations   Morphine And Related Anxiety     Prior to Admission medications   Medication Sig Start Date End Date Taking? Authorizing Provider  amoxicillin-clavulanate (AUGMENTIN) 875-125 MG tablet Take 1 tablet by mouth 2 (two) times daily for 4 days. 05/17/21 05/21/21  Sidney Ace, MD  apixaban (ELIQUIS) 5 MG TABS tablet Take 1 tablet (5 mg total) by mouth 2 (two) times daily. 08/16/20 02/12/21  Kenton Kingfisher,  Tanna Furry, NP  famotidine (PEPCID) 20 MG tablet Take 1 tablet (20 mg total) by mouth 2 (two) times daily. 08/30/20   Ronnell Freshwater, NP  furosemide (LASIX) 40 MG tablet Take 1 tablet (40 mg total) by mouth daily. 05/16/21   Sidney Ace, MD  Javier Docker Oil (OMEGA-3) 500 MG CAPS Take 1 tab po daily Patient taking differently: Take 500 mg by mouth daily. 06/04/19   Ronnell Freshwater, NP  lisinopril (ZESTRIL) 10 MG tablet TAKE 1 TABLET BY MOUTH ONCE DAILY 05/06/21   [name], MD  metoprolol succinate (TOPROL-XL) 50 MG 24 hr tablet Take 1 tablet by mouth once daily 04/20/21   [name], MD  nicotine (NICODERM CQ - DOSED IN MG/24 HOURS) 21 mg/24hr patch Place 1 patch (21 mg total) onto the skin daily. 08/01/20   Ronnell Freshwater, NP  phenylephrine-shark  liver oil-mineral oil-petrolatum (PREPARATION H) 0.25-14-74.9 % rectal ointment Place 1 application rectally 2 (two) times daily as needed for hemorrhoids. 05/16/21   Sreenath, Sudheer B, MD  polyethylene glycol (MIRALAX / GLYCOLAX) 17 g packet Take 17 g by mouth daily. 05/17/21   Sidney Ace, MD  rosuvastatin (CRESTOR) 20 MG tablet Take 1 tablet (20 mg total) by mouth daily. 05/16/20   Ronnell Freshwater, NP  senna-docusate (SENOKOT-S) 8.6-50 MG tablet Take 1 tablet by mouth 2 (two) times daily. 05/16/21   Sidney Ace, MD  VENTOLIN HFA 108 (90 Base) MCG/ACT inhaler Inhale 2 puffs into the lungs every 6 (six) hours as needed for wheezing or shortness of breath. 08/09/20   Ronnell Freshwater, NP    ___________________________________________________________________________________________________ Physical Exam: Vitals with BMI 05/18/2021 05/18/2021 05/18/2021  Height - - -  Weight - - -  BMI - - -  Systolic 94 - 622  Diastolic 48 - 88  Pulse 89 91 -     1. General:  in No  Acute distress    Chronically ill   -appearing 2. Psychological: Alert and  Oriented 3. Head/ENT:   Moist   Mucous Membranes                          Head Non traumatic, neck supple                           Poor Dentition 4. SKIN:   decreased Skin turgor,  Skin clean Dry and intact no rash 5. Heart: Regular rate and rhythm no Murmur, no Rub or gallop 6. Lungs: no wheezes or crackles   7. Abdomen: Soft,  non-tender,   distended  obese diminished bowel sounds present 8. Lower extremities: no clubbing, cyanosis, no edema 9. Neurologically Grossly intact, moving all 4 extremities equally   10. MSK: Normal range of motion  11. Pt on the way to OR scrotal exam defered   Chart has been reviewed  ______________________________________________________________________________________________  Assessment/Plan  70 y.o. male with medical history significant of    COPD on 3.5 L o2, HTn, CAD, tobacco abuse, diastolic  CHF, TIA Admitted for fournier gangrene  Present on Admission:   Fournier's gangrene of scrotum - emergently taken to OR by Urology, will admit to  stepdown given high risk to decompensate discussed with ID for now will order zosyn  and linezolid MRSA PCR Appreciate Urolyg consult ; Sp debridelment fair amount of bleeding in OR Wound is packed tightly, ok for nursing to reinforce  overnight if needed.  hold eliquis and anticoagulation. Urology will see him tomorrow for a dressing change in the afternoon, please keep NPO as well as may need additional debridement tmrw/thisweekend. Maintain foley   Hypertension - allow permissive hypertension  Sepsis -  -SIRS criteria met with  elevated white blood cell count,       Component Value Date/Time   WBC 14.0 (H) 05/18/2021 1607   LYMPHSABS 1.4 05/18/2021 1607   LYMPHSABS 2.5 06/11/2013 0404     RR >20 Today's Vitals   05/18/21 1616 05/18/21 1617 05/18/21 1618 05/18/21 2007  BP:  112/88  (!) 94/48  Pulse:   91 89  Resp:    (!) 29  Temp:    98 F (36.7 C)  TempSrc:    Oral  SpO2:   99% 97%  Weight:      Height:      PainSc: 5        -Most likely source being intra-abdominal/pelvic infection ,     Patient meeting criteria for Severe sepsis with    evidence of end organ damage/organ dysfunction such as   elevated lactic acid >2     Component Value Date/Time   LATICACIDVEN 2.5 (HH) 05/18/2021 1607      - Obtain serial lactic acid and procalcitonin level.  - Initiated IV antibiotics   - await results of blood and urine culture  - Rehydrate aggressively     10:09 PM   Hyperlipidemia stable continue home medications when able to tolerate   Gastroesophageal reflux disease without esophagitis -resume home medications when able to tolerate   Constipated -bowel regimen Repeat KUB in a.m.   Chronic diastolic CHF (congestive heart failure) (HCC) continue fluid resuscitation monitor for fluid overload   CAD in native artery  -currently stable continue statin resume beta-blocker if able to tolerate  History of PE in 2021 we will hold off on anticoagulation for tonight given the patient will need surgical intervention  COPD chronic continue oxygen supplementation as needed albuterol and scheduled DuoNeb  Obesity will need nutritional consult as an outpatient Other plan as per orders.  Functional quadriplegia patient will need PT OT and likely go back to nursing home facility expectation of needing wound care extensively  DVT prophylaxis:  SCD     Code Status:    Code Status: Prior FULL CODE  as per patient   I had personally discussed CODE STATUS with patient      Family Communication:   Family not at  Bedside     Disposition Plan:                             Back to current facility when stable                              stable   Following barriers for discharge:                            Electrolytes corrected                                                           Pain controlled with PO  medications                                white count improving able to transition to PO antibiotics                             Will need to be able to tolerate PO                                                       Will need consultants to evaluate patient prior to discharge                        Would benefit from PT/OT eval prior to DC  Ordered                                       Transition of care consulted                Consults called: URology  and ID  Admission status:  ED Disposition     ED Disposition  Admit   Condition  --   [place]: [facility] [[id number]]  Level of Care: Progressive Cardiac [[id number]]  Admit to Progressive based on following criteria: MULTISYSTEM THREATS such as stable sepsis, metabolic/electrolyte imbalance with or without encephalopathy that is responding to early treatment.  Covid Evaluation: Asymptomatic Screening Protocol (No  Symptoms)  Diagnosis: Abscess [[id number]]  Admitting Physician: Toy Baker [[id number]]  Attending Physician: Toy Baker [[id number]]  Estimated length of stay: past midnight tomorrow  Certification:: I certify this patient will need inpatient services for at least 2 midnights            inpatient     I Expect 2 midnight stay secondary to severity of patient's current illness need for inpatient interventions justified by the following:  hemodynamic instability despite optimal treatment ( hypotension  a)  Severe lab/radiological/exam abnormalities including:    Fournier gangrene and extensive comorbidities including:      CHF  CAD   COPD/asthma  Morbid Obesity   Chronic anticoagulation  That are currently affecting medical management.   I expect  patient to be hospitalized for 2 midnights requiring inpatient medical care.  Patient is at high risk for adverse outcome (such as loss of life or disability) if not treated.  Indication for inpatient stay as follows:    Need for operative/procedural  intervention    Need for IV antibiotics, IV fluids,      Level of care    progressive  tele indefinitely please discontinue once patient no longer qualifies COVID-19 Labs    Lab Results  Component Value Date   [place] 05/14/2021     Precautions: admitted as  Covid Negative     PPE: Used by the provider:   N95   eye Goggles,  Gloves      Itzelle Gains 05/18/2021, 9:54 PM    [facility]     after 2 AM please page floor coverage PA If 7AM-7PM, please contact the day team taking care of the patient using [email or web address]  Patient was evaluated in the context of the global COVID-19 pandemic, which necessitated consideration that the patient might be at risk for infection with the SARS-CoV-2 virus that causes COVID-19. Institutional protocols and algorithms that pertain to the evaluation of patients at risk for COVID-19 are in a state of rapid  change based on information released by regulatory bodies including the CDC and federal and state organizations. These policies and algorithms were followed during the patient's care.

## 2021-05-18 NOTE — Consult Note (Signed)
Pharmacy Antibiotic Note  Thomas Tanner is a 70 y.o. male with medical history including morbid obesity, CHF, COPD on oxygen at home, saddle PE in 07/2020 on apixaban, CAD, HTN admitted on 05/18/2021 with  Fournier's gangrene .  Pharmacy has been consulted for Zosyn dosing. Patient is also ordered linezolid.  Emergent OR 6/9 for scrotal exploration and drainage. Patient received surgical prophylaxis with gentamicin and ampicillin x 1 dose each.  Plan:  Zosyn 4.5 g IV q8h (4-hr infusion) --Opt for higher dose Zosyn given patient's body habitus (BMI 55) and severity of infection.  Follow-up OR cultures.  Height: 6\' 1"  (185.4 cm) Weight: (!) 191 kg (421 lb) IBW/kg (Calculated) : 79.9  Temp (24hrs), Avg:98 F (36.7 C), Min:97.9 F (36.6 C), Max:98 F (36.7 C)  Recent Labs  Lab 05/12/21 0509 05/14/21 0752 05/18/21 1607  WBC 17.6* 12.6* 14.0*  CREATININE 1.31* 1.06  --   LATICACIDVEN  --   --  2.5*    Estimated Creatinine Clearance: 117.3 mL/min (by C-G formula based on SCr of 1.06 mg/dL).    Allergies  Allergen Reactions   Morphine Anxiety and Other (See Comments)    Hallucinations   Morphine And Related Anxiety    Antimicrobials this admission: Ampicillin & Gentamicin 6/9 x 1 dose Zosyn 6/9 >> Linezolid 6/9 >>  Dose adjustments this admission: N/A  Microbiology results: 6/9 OR cultures: pending 6/9 BCx: pending 6/9 MRSA PCR: pending  Thank you for allowing pharmacy to be a part of this patient's care.  8/9 05/18/2021 10:11 PM

## 2021-05-18 NOTE — Anesthesia Preprocedure Evaluation (Signed)
Anesthesia Evaluation  Patient identified by MRN, date of birth, ID band Patient awake    Reviewed: Allergy & Precautions, H&P , NPO status , Patient's Chart, lab work & pertinent test results, reviewed documented beta blocker date and time   Airway Mallampati: III  TM Distance: >3 FB Neck ROM: full    Dental  (+) Edentulous Upper   Pulmonary shortness of breath, with exertion and Long-Term Oxygen Therapy, asthma , COPD, Current Smoker,    Pulmonary exam normal        Cardiovascular Exercise Tolerance: Poor hypertension, On Medications + CAD, + Cardiac Stents and +CHF  Normal cardiovascular exam Rhythm:regular Rate:Normal     Neuro/Psych TIA Neuromuscular disease negative psych ROS   GI/Hepatic Neg liver ROS, GERD  Medicated,  Endo/Other  negative endocrine ROSdiabetes  Renal/GU CRFRenal disease  negative genitourinary   Musculoskeletal   Abdominal   Peds  Hematology negative hematology ROS (+)   Anesthesia Other Findings Past Medical History: No date: Arthritis No date: CHF (congestive heart failure) (HCC) No date: COPD (chronic obstructive pulmonary disease) (HCC) No date: Diabetes (HCC) No date: Hyperlipidemia No date: Hypertension No date: Kidney stone No date: Obesity No date: Tachycardia No date: TIA (transient ischemic attack) Past Surgical History: 12/08/2018: CORONARY/GRAFT ACUTE MI REVASCULARIZATION; N/A     Comment:  Procedure: Coronary/Graft Acute MI Revascularization;                Surgeon: Callwood, Dwayne D, MD;  Location: [facility] INVASIVE              CV LAB;  Service: Cardiovascular;  Laterality: N/A; No date: KIDNEY STONE SURGERY 1963: left arm surgery 12/08/2018: LEFT HEART CATH AND CORONARY ANGIOGRAPHY; N/A     Comment:  Procedure: LEFT HEART CATH AND CORONARY ANGIOGRAPHY;                Surgeon: Callwood, Dwayne D, MD;  Location: [facility] INVASIVE              CV LAB;  Service:  Cardiovascular;  Laterality: N/A; 08/03/2020: PULMONARY THROMBECTOMY; N/A     Comment:  Procedure: PULMONARY THROMBECTOMY / THROMBOLYSIS;                Surgeon: Dew, Jason S, MD;  Location: [facility] INVASIVE CV               LAB;  Service: Cardiovascular;  Laterality: N/A; BMI    Body Mass Index: 55.54 kg/m     Reproductive/Obstetrics negative OB ROS                             Anesthesia Physical  Anesthesia Plan  ASA: 4 and emergent  Anesthesia Plan: General ETT   Post-op Pain Management:    Induction:   PONV Risk Score and Plan: 2  Airway Management Planned:   Additional Equipment:   Intra-op Plan:   Post-operative Plan:   Informed Consent: I have reviewed the patients History and Physical, chart, labs and discussed the procedure including the risks, benefits and alternatives for the proposed anesthesia with the patient or authorized representative who has indicated his/her understanding and acceptance.     Dental Advisory Given  Plan Discussed with: CRNA  Anesthesia Plan Comments:         Anesthesia Quick Evaluation  

## 2021-05-18 NOTE — ED Notes (Signed)
Patient transported to X-ray 

## 2021-05-18 NOTE — ED Notes (Signed)
Lactic Acid 2.7 OR Anaesthesia MD notified.

## 2021-05-18 NOTE — ED Provider Notes (Signed)
[facility] Emergency Department Provider Note  ____________________________________________  Time seen: Approximately 4:05 PM  I have reviewed the triage vital signs and the nursing notes.   HISTORY  Chief Complaint Constipation    HPI Thomas Tanner is a 70 y.o. male who presents the emergency department complaining of a constipation.  Patient states that it has been 5 weeks since having a bowel movement.  It appears that patient was here 2 weeks ago and had bowel movement 1 week prior to arrival so there is roughly a 3 to 5-week period of constipation.  Patient states that he had started 2 meds which was found to be MiraLAX and Senokot in the last 2 days and had an enema today.  He states that the enema was a very small amount of liquid, and did produce some stool but he is having ongoing pain and constipation.  No report of blood.  No other concerns at this time.  He will have some intermittent abdominal pain from his constipation.  Medical history as described below.  While patient was here, he started to endorse scrotal/testicular pain to the nursing staff.  They inform me and patient states that he has had swelling and pain in his testicles with purulent drainage.        Past Medical History:  Diagnosis Date   Arthritis    CHF (congestive heart failure) (HCC)    COPD (chronic obstructive pulmonary disease) (HCC)    Diabetes (HCC)    Hyperlipidemia    Hypertension    Kidney stone    Obesity    Tachycardia    TIA (transient ischemic attack)     Patient Active Problem List   Diagnosis Date Noted   Abscess 05/18/2021   Fournier's gangrene of scrotum 05/18/2021   Fournier gangrene 05/18/2021   Sepsis (HCC) 05/18/2021   Functional quadriplegia (HCC) 05/18/2021   Bacteremia due to Gram-negative bacteria 05/07/2021   Constipated 05/06/2021   Physical deconditioning 05/06/2021   Atherosclerosis of aorta (HCC) 09/28/2020   Acute on  chronic heart failure (HCC) 08/02/2020   Acute saddle pulmonary embolism (HCC) 08/02/2020   Hypoxia 08/01/2020   Weakness 08/01/2020   Abnormality of gait and mobility 08/01/2020   AKI (acute kidney injury) (HCC) 07/21/2020   Hypotension 07/20/2020   Leukocytosis 07/20/2020   Cellulitis of right lower extremity 07/20/2020   Pressure injury of skin 07/12/2020   Acute respiratory failure with hypoxia (HCC) 07/10/2020   COPD with acute exacerbation (HCC)    Elevated troponin    Chronic diastolic CHF (congestive heart failure) (HCC) 05/24/2020   Anasarca    Acute renal failure superimposed on stage 3a chronic kidney disease (HCC)    Hyperlipidemia    Tobacco abuse    Shortness of breath 04/28/2020   Upper respiratory tract infection due to COVID-19 virus 04/28/2020   Edema 04/28/2020   Encounter for general adult medical examination with abnormal findings 01/15/2019   Acute non-recurrent maxillary sinusitis 01/15/2019   Gastroesophageal reflux disease without esophagitis 01/15/2019   CAD in native artery 12/16/2018   Morbid obesity with BMI of 50.0-59.9, adult (HCC) 12/16/2018   Cigarette smoker motivated to quit 12/16/2018   Chronic pain of right knee 08/04/2018   Mild intermittent asthma 08/04/2018   Impaired fasting glucose 04/03/2018   Low back pain with right-sided sciatica 03/05/2018   Chronic right-sided low back pain with right-sided sciatica 03/05/2018   Pain in right hip 03/05/2018   Hypertension 03/05/2018   Foreign body  in bladder and urethra 06/09/2013   Kidney stone 06/09/2013   Ureteric stone 06/09/2013   Kidney stone 06/09/2013    Past Surgical History:  Procedure Laterality Date   CORONARY/GRAFT ACUTE MI REVASCULARIZATION N/A 12/08/2018   Procedure: Coronary/Graft Acute MI Revascularization;  Surgeon: Alwyn Pea, MD;  Location: [facility] INVASIVE CV LAB;  Service: Cardiovascular;  Laterality: N/A;   KIDNEY STONE SURGERY     left arm surgery  1963   LEFT  HEART CATH AND CORONARY ANGIOGRAPHY N/A 12/08/2018   Procedure: LEFT HEART CATH AND CORONARY ANGIOGRAPHY;  Surgeon: Alwyn Pea, MD;  Location: [facility] IN[facility]AS[facility] CV LAB;  Service: Cardiovascular;  Laterality: N/A;   PULMONARY THROMBECTOMY N/A 08/03/2020   Procedure: PULMONARY THROMBECTOMY / THROMBOLYSIS;  Surgeon: Annice Needy, MD;  Location: [facility] INVAS[facility] CV LAB;  Service: Cardiovascular;  Laterality: N/A;    Prior to Admission medications   Medication Sig Start Date End Date Taking? Authorizing Provider  amoxicillin-clavulanate (AUGMENTIN) 875-125 MG tablet Take 1 tablet by mouth 2 (two) times daily for 4 days. 05/17/21 05/21/21  Tresa Moore, MD  apixaban (ELIQUIS) 5 MG TABS tablet Take 1 tablet (5 mg total) by mouth 2 (two) times daily. 08/16/20 02/12/21  Theotis Burrow, NP  famotidine (PEPCID) 20 MG tablet Take 1 tablet (20 mg total) by mouth 2 (two) times daily. 08/30/20   Carlean Jews, NP  furosemide (LASIX) 40 MG tablet Take 1 tablet (40 mg total) by mouth daily. 05/16/21   Tresa Moore, MD  Boris Lown Oil (OMEGA-3) 500 MG CAPS Take 1 tab po daily Patient taking differently: Take 500 mg by mouth daily. 06/04/19   Carlean Jews, NP  lisinopril (ZESTRIL) 10 MG tablet TAKE 1 TABLET BY MOUTH ONCE DAILY 05/06/21   Lyndon Code, MD  metoprolol succinate (TOPROL-XL) 50 MG 24 hr tablet Take 1 tablet by mouth once daily 04/20/21   Lyndon Code, MD  nicotine (NICODERM CQ - DOSED IN MG/24 HOURS) 21 mg/24hr patch Place 1 patch (21 mg total) onto the skin daily. 08/01/20   Carlean Jews, NP  phenylephrine-shark liver oil-mineral oil-petrolatum (PREPARATION H) 0.25-14-74.9 % rectal ointment Place 1 application rectally 2 (two) times daily as needed for hemorrhoids. 05/16/21   Sreenath, Sudheer B, MD  polyethylene glycol (MIRALAX / GLYCOLAX) 17 g packet Take 17 g by mouth daily. 05/17/21   Tresa Moore, MD  rosuvastatin (CRESTOR) 20 MG tablet Take 1 tablet (20 mg total) by mouth daily.  05/16/20   Carlean Jews, NP  senna-docusate (SENOKOT-S) 8.6-50 MG tablet Take 1 tablet by mouth 2 (two) times daily. 05/16/21   Tresa Moore, MD  VENTOLIN HFA 108 (90 Base) MCG/ACT inhaler Inhale 2 puffs into the lungs every 6 (six) hours as needed for wheezing or shortness of breath. 08/09/20   Carlean Jews, NP    Allergies Morphine and Morphine and related  Family History  Problem Relation Age of Onset   Alzheimer's disease Mother    CAD Father     Social History Social History   Tobacco Use   Smoking status: Every Day    Packs/day: 0.50    Pack years: 0.00    Types: Cigarettes    Last attempt to quit: 07/13/2020    Years since quitting: 0.8   Smokeless tobacco: Never  Substance Use Topics   Alcohol use: No   Drug use: No     Review of Systems  Constitutional: No fever/chills Eyes:  No visual changes. No discharge ENT: No upper respiratory complaints. Cardiovascular: no chest pain. Respiratory: no cough. No SOB. Gastrointestinal: No abdominal pain.  No nausea, no vomiting.  No diarrhea.  Positive constipation. Genitourinary: Negative for dysuria. No hematuria Musculoskeletal: Negative for musculoskeletal pain. Skin: Negative for rash, abrasions, lacerations, ecchymosis. Neurological: Negative for headaches, focal weakness or numbness.  10 System ROS otherwise negative.  ____________________________________________   PHYSICAL EXAM:  VITAL SIGNS: ED Triage Vitals  Enc Vitals Group     BP --      Pulse Rate 05/18/21 1558 93     Resp 05/18/21 1558 20     Temp 05/18/21 1602 97.9 F (36.6 C)     Temp Source 05/18/21 1602 Oral     SpO2 05/18/21 1558 95 %     Weight 05/18/21 1600 (!) 421 lb (191 kg)     Height 05/18/21 1600 6\' 1"  (1.854 m)     Head Circumference --      Peak Flow --      Pain Score 05/18/21 1559 5     Pain Loc --      Pain Edu? --      Excl. in GC? --      Constitutional: Alert and oriented. Well appearing and in no acute  distress. Eyes: Conjunctivae are normal. PERRL. EOMI. Head: Atraumatic. ENT:      Ears:       Nose: No congestion/rhinnorhea.      Mouth/Throat: Mucous membranes are moist.  Neck: No stridor.    Cardiovascular: Normal rate, regular rhythm. Normal S1 and S2.  Good peripheral circulation. Respiratory: Normal respiratory effort without tachypnea or retractions. Lungs CTAB. Good air entry to the bases with no decreased or absent breath sounds. Gastrointestinal: Bowel sounds 4 quadrants. Soft and nontender to palpation. No guarding or rigidity. No palpable masses. No distention. No CVA tenderness. Musculoskeletal: Full range of motion to all extremities. No gross deformities appreciated. Neurologic:  Normal speech and language. No gross focal neurologic deficits are appreciated.  Skin:  Skin is warm, dry and intact. No rash noted. Psychiatric: Mood and affect are normal. Speech and behavior are normal. Patient exhibits appropriate insight and judgement.   ____________________________________________   LABS (all labs ordered are listed, but only abnormal results are displayed)  Labs Reviewed  CBC WITH DIFFERENTIAL/PLATELET - Abnormal; Notable for the following components:      Result Value   WBC 14.0 (*)    Hemoglobin 12.7 (*)    HCT 37.9 (*)    Neutro Abs 11.1 (*)    Monocytes Absolute 1.3 (*)    Abs Immature Granulocytes 0.08 (*)    All other components within normal limits  LACTIC ACID, PLASMA - Abnormal; Notable for the following components:   Lactic Acid, Venous 2.5 (*)    All other components within normal limits  COMPREHENSIVE METABOLIC PANEL - Abnormal; Notable for the following components:   Sodium 131 (*)    Chloride 90 (*)    Glucose, Bld 154 (*)    Creatinine, Ser 1.42 (*)    Calcium 8.1 (*)    Albumin 2.6 (*)    Total Bilirubin 1.6 (*)    GFR, Estimated 54 (*)    All other components within normal limits  CBC - Abnormal; Notable for the following components:    WBC 17.4 (*)    RBC 3.34 (*)    Hemoglobin 11.7 (*)    HCT 31.2 (*)    MCH 35.0 (*)  MCHC 37.5 (*)    RDW 15.9 (*)    Platelets 413 (*)    All other components within normal limits  CBG MONITORING, ED - Abnormal; Notable for the following components:   Glucose-Capillary 172 (*)    All other components within normal limits  RESP PANEL BY RT-PCR (FLU A&B, COVID) ARPGX2  CULTURE, BLOOD (ROUTINE X 2)  CULTURE, BLOOD (ROUTINE X 2)  AEROBIC/ANAEROBIC CULTURE W GRAM STAIN (SURGICAL/DEEP WOUND)  MRSA PCR SCREENING  LACTIC ACID, PLASMA  LACTIC ACID, PLASMA  LACTIC ACID, PLASMA  PROCALCITONIN  URINALYSIS, COMPLETE (UACMP) WITH MICROSCOPIC  CBC  TYPE AND SCREEN   ____________________________________________  EKG   ____________________________________________  RADIOLOGY I personally viewed and evaluated these images as part of my medical decision making, as well as reviewing the written report by the radiologist.  ED Provider Interpretation: Visualization of the x-ray of the abdomen revealed findings consistent with constipation without bowel obstruction.  Ultrasound of the scrotum revealed findings concerning for Fournier's gangrene.  I talked to the radiologist about the ultrasound as well as the confirmation on CT scan consistent with Fournier's gangrene.  DG Abdomen 1 View  Result Date: 05/18/2021 CLINICAL DATA:  Constipation. EXAM: ABDOMEN - 1 VIEW COMPARISON:  None. FINDINGS: The bowel gas pattern is normal. A large amount of stool is seen within the ascending colon. No radio-opaque calculi or other significant radiographic abnormality are seen. Marked severity chronic and degenerative changes are seen involving the right hip. IMPRESSION: Large stool burden without bowel obstruction. Electronically Signed   By: Aram Candelahaddeus  Houston M.D.   On: 05/18/2021 17:37   CT PELVIS WO CONTRAST  Result Date: 05/18/2021 CLINICAL DATA:  Concern for gas in the scrotum on ultrasound. Scrotal pain  for 5 weeks. Draining wound EXAM: CT PELVIS WITHOUT CONTRAST TECHNIQUE: Multidetector CT imaging of the pelvis was performed following the standard protocol without intravenous contrast. COMPARISON:  None. FINDINGS: Urinary Tract:  Distal ureters and bladder normal. Bowel: No evidence of constipation. Normal stool volume. Factors limited stool in the sigmoid colon rectum. Vascular/Lymphatic: Calcification abdominal aorta. Pelvic lymphadenopathy. Reproductive:  Prostate is normal. There is gas with LEFT hemiscrotum. There is a large amount of gas interspersed within a LEFT hydrocele. The testicle is suspended within this large volume of gas and fluid. (Image 190/series 3). Gas extends along the inguinal canal to level the base of the penis (image 150/series 3.) Additionally there is gas within the perineum midline (image 153/3. There is gas extends to the base of the penis (image 151/3. Other:  No free fluid or abscess in the peritoneal space. Musculoskeletal: No evidence of osteomyelitis. Severe cystic degenerative change of the RIGHT hip joint. IMPRESSION: 1. Large volume of gas interspersed within a large LEFT hydrocele. Findings consistent with a gas-forming bacteria infection of the LEFT hemiscrotum. 2. Gas extends superiorly from the LEFT hemiscrotum through the LEFT inguinal canal to level the base of the penis. 3. Additionally gas extends into the midline perineum to level of the base of the penis. 4. Findings consistent with Fournier's gangrene. Recommend emergent urologic surgical consultation. Findings conveyed toJONATHAN Isrrael Fluckiger on 05/18/2021  at20:17. Electronically Signed   By: Genevive BiStewart  Edmunds M.D.   On: 05/18/2021 20:17   US SCROTUM W/DOPPLER  Result Date: 05/18/2021 CLINICAL DATA:  Scrotal pain for 5 weeks, wound draining at RIGHT hemiscrotum EXAM: SCROTAL ULTRASOUND DOPPLER ULTRASOUND OF THE TESTICLES TECHNIQUE: Complete ultrasound examination of the testicles, epididymis, and other scrotal  structures was performed. Color and spectral  Doppler ultrasound were also utilized to evaluate blood flow to the testicles. COMPARISON:  None FINDINGS: Right testicle Measurements: 3.8 x 2.8 x 3.0 cm. Normal echogenicity without mass or calcification. Internal blood flow present on color Doppler imaging. Left testicle Measurements: 4.1 x 2.2 x 2.5 cm. Normal echogenicity without mass or calcification. Internal blood flow present on color Doppler imaging. Right epididymis:  Normal in size and appearance. Left epididymis:  Normal in size and appearance. Hydrocele:  Trace RIGHT hydrocele Varicocele:  None visualized. Pulsed Doppler interrogation of both testes demonstrates normal low resistance arterial and venous waveforms bilaterally. Marked soft tissue swelling in the scrotum bilaterally. Multiple new tiny foci of bright echogenicity new since previous study which cause scattered shadowing, highly suspicious for soft tissue gas and question Fournier's gangrene. Scattered areas of edema/fluid within tissue planes. IMPRESSION: Normal appearing testes and epididymi. Marked soft tissue swelling and edema in the scrotum with multiple new foci of bright shadowing in the scrotum especially on RIGHT. Findings are highly suspicious for foci of gas and presence of Fournier's gangrene. This could be confirmed by CT imaging. No large discrete drainable fluid collection identified. Critical Value/emergent results were called by telephone at the time of interpretation on 05/18/2021 at 6:51 pm to provider [facility] PA, who verbally acknowledged these results. Electronically Signed   By: Ulyses Southward M.D.   On: 05/18/2021 18:51    ____________________________________________    PROCEDURES  Procedure(s) performed:    Procedures    Medications  fentaNYL (SUBLIMAZE) injection 25 mcg (has no administration in time range)  ondansetron (ZOFRAN) injection 4 mg (has no administration in time range)  sodium  hypochlorite (DAKIN'S 1/2 STRENGTH) topical solution (has no administration in time range)  piperacillin-tazobactam (ZOSYN) IVPB 4.5 g (has no administration in time range)  linezolid (ZYVOX) IVPB 600 mg (has no administration in time range)  pantoprazole (PROTONIX) injection 40 mg (has no administration in time range)  0.9 %  sodium chloride infusion (has no administration in time range)  ampicillin (OMNIPEN) 2 g in sodium chloride 0.9 % 100 mL IVPB (2 g Intravenous New Bag/Given 05/18/21 2121)  gentamicin (GARAMYCIN) 250 mg in dextrose 5 % 50 mL IVPB (250 mg Intravenous New Bag/Given 05/18/21 2109)     ____________________________________________   INITIAL IMPRESSION / ASSESSMENT AND PLAN / ED COURSE  Pertinent labs & imaging results that were available during my care of the patient were reviewed by me and considered in my medical decision making (see chart for details).  Review of the [place] CSRS was performed in accordance of the [facility] prior to dispensing any controlled drugs.           Patient's diagnosis is consistent with foreigners gangrene, constipation.  Patient presented to the emergency department the primary complaint of constipation.  While he was here the patient endorsed some scrotal pain, and edema.  Patient had significant scrotal edema with purulent drainage from the inferior portion of the scrotum.  Extreme tenderness, palpation reveals some crepitus and release of gas through the draining purulence.  Ultrasound revealed findings concerning for Fournier's gangrene which was confirmed with CT scan.  I discussed the patient with on-call urologist who agrees that this patient does have Fournier's gangrene, started on ampicillin and gentamicin in the emergency department and will receive OR debridement this evening.  Following this patient will be admitted to the hospitalist service.  Patient care is transferred to urology and hospitalist service at this time..      ____________________________________________  FINAL CLINICAL IMPRESSION(S) / ED DIAGNOSES  Final diagnoses:  Scrotal pain  Fournier's gangrene  Constipation, unspecified constipation type      NEW MEDICATIONS STARTED DURING THIS VISIT:  ED Discharge Orders     None           This chart was dictated using voice recognition software/Dragon. Despite best efforts to proofread, errors can occur which can change the meaning. Any change was purely unintentional.    Racheal Patches, PA-C 05/18/21 2338    Phineas Semen, MD 05/18/21 [phone]

## 2021-05-18 NOTE — ED Notes (Signed)
Patient also reports abscess to scrotum. Scrotum evaluated by this RN. Scrotum appears to be red, and swollen. Possible draining abscess noted to right scrotum. Christiane Ha, PA notified.

## 2021-05-18 NOTE — ED Notes (Signed)
Patient is resting comfortably. Ultrasound at bedside.  

## 2021-05-18 NOTE — ED Notes (Signed)
This RN spoke with Thomas Tanner, [place] RN about ordered abx and status of abx from pharmacy. Patient to be transported to the OR via orderly.

## 2021-05-18 NOTE — ED Notes (Signed)
Patient reports last BM today. Patient reports enema at rehab facility, stating "it was not as big as I wanted, I still feel full, she got on my belly and pushed and it felt like tearing." Patient reports heaviness to his rectum.

## 2021-05-18 NOTE — ED Triage Notes (Signed)
Patient c/o constipation. Non ambulatory. Coming by EMS from [facility]). Staff gave enema today and had some bowel leakage. Patient reports no movement for 5 weeks

## 2021-05-19 ENCOUNTER — Inpatient Hospital Stay: Payer: Medicare Other

## 2021-05-19 ENCOUNTER — Inpatient Hospital Stay
Admit: 2021-05-19 | Discharge: 2021-05-19 | Disposition: A | Discharge status: Home / Self Care | Payer: Medicare Other | Attending: Pulmonary Disease | Admitting: Pulmonary Disease

## 2021-05-19 DIAGNOSIS — R6520 Severe sepsis without septic shock: Secondary | ICD-10-CM

## 2021-05-19 DIAGNOSIS — N493 Fournier gangrene: Secondary | ICD-10-CM

## 2021-05-19 DIAGNOSIS — A419 Sepsis, unspecified organism: Principal | ICD-10-CM

## 2021-05-19 LAB — ECHOCARDIOGRAM COMPLETE
Height: 73 in
S' Lateral: 3.14 cm
Weight: 6736 oz

## 2021-05-19 LAB — COMPREHENSIVE METABOLIC PANEL
ALT: 28 U/L (ref 0–44)
AST: 20 U/L (ref 15–41)
Albumin: 2.4 g/dL — ABNORMAL LOW (ref 3.5–5.0)
Alkaline Phosphatase: 68 U/L (ref 38–126)
Anion gap: 14 (ref 5–15)
BUN: 18 mg/dL (ref 8–23)
CO2: 28 mmol/L (ref 22–32)
Calcium: 8 mg/dL — ABNORMAL LOW (ref 8.9–10.3)
Chloride: 90 mmol/L — ABNORMAL LOW (ref 98–111)
Creatinine, Ser: 1.8 mg/dL — ABNORMAL HIGH (ref 0.61–1.24)
GFR, Estimated: 40 mL/min — ABNORMAL LOW (ref >60)
Glucose, Bld: 166 mg/dL — ABNORMAL HIGH (ref 70–99)
Potassium: 4.3 mmol/L (ref 3.5–5.1)
Sodium: 132 mmol/L — ABNORMAL LOW (ref 135–145)
Total Bilirubin: 1.7 mg/dL — ABNORMAL HIGH (ref 0.3–1.2)
Total Protein: 5.9 g/dL — ABNORMAL LOW (ref 6.5–8.1)

## 2021-05-19 LAB — HEMOGLOBIN A1C
Hgb A1c MFr Bld: 6.4 % — ABNORMAL HIGH (ref 4.8–5.6)
Mean Plasma Glucose: 137 mg/dL

## 2021-05-19 LAB — BASIC METABOLIC PANEL
Anion gap: 12 (ref 5–15)
BUN: 20 mg/dL (ref 8–23)
CO2: 30 mmol/L (ref 22–32)
Calcium: 8 mg/dL — ABNORMAL LOW (ref 8.9–10.3)
Chloride: 91 mmol/L — ABNORMAL LOW (ref 98–111)
Creatinine, Ser: 1.99 mg/dL — ABNORMAL HIGH (ref 0.61–1.24)
GFR, Estimated: 36 mL/min — ABNORMAL LOW (ref >60)
Glucose, Bld: 158 mg/dL — ABNORMAL HIGH (ref 70–99)
Potassium: 4.1 mmol/L (ref 3.5–5.1)
Sodium: 133 mmol/L — ABNORMAL LOW (ref 135–145)

## 2021-05-19 LAB — CBC
HCT: 29.4 % — ABNORMAL LOW (ref 39.0–52.0)
HCT: 30.5 % — ABNORMAL LOW (ref 39.0–52.0)
HCT: 29 % — ABNORMAL LOW (ref 39.0–52.0)
Hemoglobin: 9.8 g/dL — ABNORMAL LOW (ref 13.0–17.0)
Hemoglobin: 10 g/dL — ABNORMAL LOW (ref 13.0–17.0)
Hemoglobin: 9.4 g/dL — ABNORMAL LOW (ref 13.0–17.0)
MCH: 31.5 pg (ref 26.0–34.0)
MCH: 30.4 pg (ref 26.0–34.0)
MCH: 29.8 pg (ref 26.0–34.0)
MCHC: 33.3 g/dL (ref 30.0–36.0)
MCHC: 32.8 g/dL (ref 30.0–36.0)
MCHC: 32.4 g/dL (ref 30.0–36.0)
MCV: 94.5 fL (ref 80.0–100.0)
MCV: 92.7 fL (ref 80.0–100.0)
MCV: 92.1 fL (ref 80.0–100.0)
Platelets: 380 10*3/uL (ref 150–400)
Platelets: 393 10*3/uL (ref 150–400)
Platelets: 395 10*3/uL (ref 150–400)
RBC: 3.11 MIL/uL — ABNORMAL LOW (ref 4.22–5.81)
RBC: 3.29 MIL/uL — ABNORMAL LOW (ref 4.22–5.81)
RBC: 3.15 MIL/uL — ABNORMAL LOW (ref 4.22–5.81)
RDW: 14.6 % (ref 11.5–15.5)
RDW: 14.6 % (ref 11.5–15.5)
RDW: 14.5 % (ref 11.5–15.5)
WBC: 20.3 10*3/uL — ABNORMAL HIGH (ref 4.0–10.5)
WBC: 17.3 10*3/uL — ABNORMAL HIGH (ref 4.0–10.5)
WBC: 14.7 10*3/uL — ABNORMAL HIGH (ref 4.0–10.5)
nRBC: 0 % (ref 0.0–0.2)
nRBC: 0 % (ref 0.0–0.2)
nRBC: 0 % (ref 0.0–0.2)

## 2021-05-19 LAB — PHOSPHORUS: Phosphorus: 5.1 mg/dL — ABNORMAL HIGH (ref 2.5–4.6)

## 2021-05-19 LAB — CBC WITH DIFFERENTIAL/PLATELET
Abs Immature Granulocytes: 0.19 10*3/uL — ABNORMAL HIGH (ref 0.00–0.07)
Basophils Absolute: 0.1 10*3/uL (ref 0.0–0.1)
Basophils Relative: 0 %
Eosinophils Absolute: 0 10*3/uL (ref 0.0–0.5)
Eosinophils Relative: 0 %
HCT: 31 % — ABNORMAL LOW (ref 39.0–52.0)
Hemoglobin: 10.3 g/dL — ABNORMAL LOW (ref 13.0–17.0)
Immature Granulocytes: 1 %
Lymphocytes Relative: 4 %
Lymphs Abs: 0.9 10*3/uL (ref 0.7–4.0)
MCH: 31.8 pg (ref 26.0–34.0)
MCHC: 33.2 g/dL (ref 30.0–36.0)
MCV: 95.7 fL (ref 80.0–100.0)
Monocytes Absolute: 1.4 10*3/uL — ABNORMAL HIGH (ref 0.1–1.0)
Monocytes Relative: 6 %
Neutro Abs: 19.5 10*3/uL — ABNORMAL HIGH (ref 1.7–7.7)
Neutrophils Relative %: 89 %
Platelets: 350 10*3/uL (ref 150–400)
RBC: 3.24 MIL/uL — ABNORMAL LOW (ref 4.22–5.81)
RDW: 14.9 % (ref 11.5–15.5)
WBC: 22 10*3/uL — ABNORMAL HIGH (ref 4.0–10.5)
nRBC: 0 % (ref 0.0–0.2)

## 2021-05-19 LAB — GLUCOSE, CAPILLARY
Glucose-Capillary: 165 mg/dL — ABNORMAL HIGH (ref 70–99)
Glucose-Capillary: 143 mg/dL — ABNORMAL HIGH (ref 70–99)
Glucose-Capillary: 184 mg/dL — ABNORMAL HIGH (ref 70–99)
Glucose-Capillary: 150 mg/dL — ABNORMAL HIGH (ref 70–99)
Glucose-Capillary: 137 mg/dL — ABNORMAL HIGH (ref 70–99)
Glucose-Capillary: 163 mg/dL — ABNORMAL HIGH (ref 70–99)

## 2021-05-19 LAB — TSH: TSH: 1.946 u[IU]/mL (ref 0.350–4.500)

## 2021-05-19 LAB — TYPE AND SCREEN
ABO/RH(D): O POS
Antibody Screen: NEGATIVE

## 2021-05-19 LAB — LACTIC ACID, PLASMA
Lactic Acid, Venous: 3.9 mmol/L (ref 0.5–1.9)
Lactic Acid, Venous: 2 mmol/L (ref 0.5–1.9)

## 2021-05-19 LAB — PROCALCITONIN: Procalcitonin: 2.04 ng/mL

## 2021-05-19 LAB — MRSA PCR SCREENING: MRSA by PCR: NEGATIVE

## 2021-05-19 LAB — MAGNESIUM: Magnesium: 2.1 mg/dL (ref 1.7–2.4)

## 2021-05-19 MED ORDER — ROSUVASTATIN CALCIUM 10 MG PO TABS
20.0000 mg | ORAL_TABLET | Freq: Every day | ORAL | Status: DC
  Administered 2021-05-19 – 2021-05-27 (×8): 20 mg via ORAL
  Filled 2021-05-19 (×8): qty 2

## 2021-05-19 MED ORDER — NICOTINE 21 MG/24HR TD PT24
21.0000 mg | MEDICATED_PATCH | Freq: Every day | TRANSDERMAL | Status: DC
  Administered 2021-05-19 – 2021-05-27 (×8): 21 mg via TRANSDERMAL
  Filled 2021-05-19 (×8): qty 1

## 2021-05-19 MED ORDER — FENTANYL CITRATE (PF) 100 MCG/2ML IJ SOLN
50.0000 ug | INTRAMUSCULAR | Status: DC | PRN
  Administered 2021-05-19: 50 ug via INTRAVENOUS
  Administered 2021-05-20 – 2021-05-21 (×3): 100 ug via INTRAVENOUS
  Administered 2021-05-21: 50 ug via INTRAVENOUS
  Administered 2021-05-21 – 2021-05-24 (×15): 100 ug via INTRAVENOUS
  Administered 2021-05-25 – 2021-05-26 (×7): 50 ug via INTRAVENOUS
  Filled 2021-05-19 (×26): qty 2

## 2021-05-19 MED ORDER — SODIUM CHLORIDE 0.9 % IV SOLN: 250.0000 mL | INTRAVENOUS | Status: DC

## 2021-05-19 MED ORDER — ACETAMINOPHEN 10 MG/ML IV SOLN
1000.0000 mg | Freq: Once | INTRAVENOUS | Status: AC
  Administered 2021-05-19: 1000 mg via INTRAVENOUS
  Filled 2021-05-19: qty 100

## 2021-05-19 MED ORDER — SENNA 8.6 MG PO TABS
1.0000 | ORAL_TABLET | Freq: Two times a day (BID) | ORAL | Status: DC
  Administered 2021-05-19 – 2021-05-27 (×16): 8.6 mg via ORAL
  Filled 2021-05-19 (×16): qty 1

## 2021-05-19 MED ORDER — MIDAZOLAM HCL 2 MG/2ML IJ SOLN
1.0000 mg | Freq: Once | INTRAMUSCULAR | Status: AC
  Administered 2021-05-19: 1 mg via INTRAVENOUS

## 2021-05-19 MED ORDER — PIPERACILLIN SOD-TAZOBACTAM SO 2.25 (2-0.25) G IV SOLR
4.5000 g | Freq: Three times a day (TID) | INTRAVENOUS | Status: DC
  Filled 2021-05-19: qty 20

## 2021-05-19 MED ORDER — SODIUM CHLORIDE 0.9 % IV SOLN: 75.0000 mL/h | INTRAVENOUS | Status: AC

## 2021-05-19 MED ORDER — NOREPINEPHRINE 4 MG/250ML-% IV SOLN
2.0000 ug/min | INTRAVENOUS | Status: DC
  Administered 2021-05-19: 2 ug/min via INTRAVENOUS
  Administered 2021-05-19: 8 ug/min via INTRAVENOUS
  Administered 2021-05-20: 10 ug/min via INTRAVENOUS
  Filled 2021-05-19 (×2): qty 250

## 2021-05-19 MED ORDER — FENTANYL CITRATE (PF) 100 MCG/2ML IJ SOLN
25.0000 ug | Freq: Once | INTRAMUSCULAR | Status: AC
  Administered 2021-05-19: 25 ug via INTRAVENOUS
  Filled 2021-05-19: qty 2

## 2021-05-19 MED ORDER — MIDAZOLAM HCL 2 MG/2ML IJ SOLN
1.0000 mg | Freq: Once | INTRAMUSCULAR | Status: AC
  Administered 2021-05-19: 1 mg via INTRAVENOUS
  Filled 2021-05-19: qty 2

## 2021-05-19 MED ORDER — ALBUTEROL SULFATE (2.5 MG/3ML) 0.083% IN NEBU: 2.5000 mg | INHALATION_SOLUTION | RESPIRATORY_TRACT | Status: DC | PRN

## 2021-05-19 MED ORDER — POLYETHYLENE GLYCOL 3350 17 G PO PACK
17.0000 g | PACK | Freq: Every day | ORAL | Status: DC | PRN
  Administered 2021-05-23: 17 g via ORAL
  Filled 2021-05-19: qty 1

## 2021-05-19 MED ORDER — ACETAMINOPHEN 325 MG PO TABS
650.0000 mg | ORAL_TABLET | Freq: Four times a day (QID) | ORAL | Status: DC | PRN
  Administered 2021-05-22 – 2021-05-26 (×4): 650 mg via ORAL
  Filled 2021-05-19 (×4): qty 2

## 2021-05-19 MED ORDER — NOREPINEPHRINE 4 MG/250ML-% IV SOLN: 0.0000 ug/min | INTRAVENOUS | Status: DC

## 2021-05-19 MED ORDER — CHLORHEXIDINE GLUCONATE CLOTH 2 % EX PADS
6.0000 | MEDICATED_PAD | Freq: Every day | CUTANEOUS | Status: DC
  Administered 2021-05-20 – 2021-05-27 (×5): 6 via TOPICAL

## 2021-05-19 MED ORDER — MIDODRINE HCL 5 MG PO TABS
10.0000 mg | ORAL_TABLET | Freq: Three times a day (TID) | ORAL | Status: DC
  Administered 2021-05-19 – 2021-05-27 (×20): 10 mg via ORAL
  Filled 2021-05-19 (×20): qty 2

## 2021-05-19 MED ORDER — LACTATED RINGERS IV BOLUS
500.0000 mL | Freq: Once | INTRAVENOUS | Status: AC
  Administered 2021-05-19: 500 mL via INTRAVENOUS

## 2021-05-19 MED ORDER — ACETAMINOPHEN 650 MG RE SUPP: 650.0000 mg | Freq: Four times a day (QID) | RECTAL | Status: DC | PRN

## 2021-05-19 MED ORDER — PIPERACILLIN-TAZOBACTAM 4.5 G IVPB
4.5000 g | Freq: Three times a day (TID) | INTRAVENOUS | Status: DC
  Administered 2021-05-19 – 2021-05-20 (×3): 4.5 g via INTRAVENOUS
  Filled 2021-05-19 (×6): qty 100

## 2021-05-19 MED ORDER — INSULIN ASPART 100 UNIT/ML IJ SOLN
0.0000 [IU] | INTRAMUSCULAR | Status: DC
  Administered 2021-05-19: 2 [IU] via SUBCUTANEOUS
  Administered 2021-05-19: 1 [IU] via SUBCUTANEOUS
  Administered 2021-05-19: 2 [IU] via SUBCUTANEOUS
  Administered 2021-05-19 (×2): 1 [IU] via SUBCUTANEOUS
  Administered 2021-05-20: 4 [IU] via SUBCUTANEOUS
  Administered 2021-05-20: 2 [IU] via SUBCUTANEOUS
  Administered 2021-05-21: 1 [IU] via SUBCUTANEOUS
  Administered 2021-05-21: 3 [IU] via SUBCUTANEOUS
  Administered 2021-05-21: 2 [IU] via SUBCUTANEOUS
  Administered 2021-05-21 – 2021-05-22 (×5): 1 [IU] via SUBCUTANEOUS
  Administered 2021-05-22: 2 [IU] via SUBCUTANEOUS
  Administered 2021-05-22 – 2021-05-24 (×5): 1 [IU] via SUBCUTANEOUS
  Filled 2021-05-19 (×21): qty 1

## 2021-05-19 MED ORDER — FENTANYL CITRATE (PF) 100 MCG/2ML IJ SOLN
25.0000 ug | Freq: Once | INTRAMUSCULAR | Status: DC
Start: 2021-05-19 — End: 2021-05-20

## 2021-05-19 MED ORDER — IPRATROPIUM-ALBUTEROL 0.5-2.5 (3) MG/3ML IN SOLN
3.0000 mL | Freq: Four times a day (QID) | RESPIRATORY_TRACT | Status: DC
  Administered 2021-05-19 – 2021-05-21 (×6): 3 mL via RESPIRATORY_TRACT
  Filled 2021-05-19 (×7): qty 3

## 2021-05-19 NOTE — Progress Notes (Signed)
   Subjective Status post scrotal exploration and debridement for Fournier's gangrene overnight Pain control improved this morning Admitted to ICU overnight for hypotension requiring pressors  Physical Exam: BP (!) 102/52   Pulse 84   Temp 97.8 F (36.6 C) (Oral)   Resp (!) 25   Ht 6\' 1"  (1.854 m)   Wt (!) 191 kg   SpO2 92%   BMI 55.54 kg/m    Constitutional:  Alert and oriented, No acute distress. Respiratory: Normal respiratory effort, no increased work of breathing. GI: Abdomen is soft, non-tender, non-distended, obese GU: Perineal wound left packed, no spreading erythema or purulence at skin edges Drains: Foley with light pink urine  Laboratory Data: Reviewed in epic  Assessment & Plan:   70 year old extremely comorbid male with morbid obesity, COPD on oxygen at baseline, diabetes, PE in August 2021 on Eliquis who presented 05/18/2021 with Fournier's gangrene.  Intraoperatively also found to have diffuse urethral stricture disease requiring dilation and catheter placement over a wire.   Recovering appropriately thus far.  Agree with continuing broad-spectrum antibiotics, intraoperative tissue cultures pending.  Appreciate excellent hospitalist and ICU care.  Will plan to return to the OR tomorrow morning with Dr. 07/18/2021 for first wound change in the setting of his Eliquis, I'm concerned for recurrent bleeding if this was attempted to be changed at the bedside, as well as significant patient discomfort.  May require additional debridement at that time as well.  -Npo at midnight-> plan for OR tomorrow for wound change and possible additional debridement with Dr. Lonna Cobb, case discussed with him -Continue antibiotics, follow-up tissue cultures -Appreciate hospitalist and ICU care  Lonna Cobb, MD

## 2021-05-19 NOTE — Progress Notes (Signed)
*  PRELIMINARY RESULTS* Echocardiogram 2D Echocardiogram has been performed.  Cristela Blue 05/19/2021, 2:34 PM

## 2021-05-19 NOTE — Consult Note (Signed)
NAME:  Thomas Tanner, MRN:  [id number], DOB:  [birth year], LOS: 1 ADMISSION DATE:  05/18/2021, CONSULTATION DATE: 05/19/2021 REFERRING MD: Dr. Arville Care, CHIEF COMPLAINT: Hypotension   History of Present Illness:  This is a 70 yo male who presented to [facility] ER on 06/9 with c/o constipation, abdominal pain, and scrotal pain.  However, scrotal ultrasound and CT Pelvis revealed extensive gas in the scrotum consistent with Fournier's Gangrene, and also noted upon examination to have extensive purulent drainage from the midline of the scrotum.  ER physician consulted Urology and pt transported emergently to the OR.  He required cystoscopy; urethral dilation of urethral stricture; complex catheter placement; scrotal exploration and debridement of skin/subcutaneous tissue/muscle/ and fascia.  He was subsequently admitted to the stepdown unit per hospitalist team for additional workup and treatment.  On 06/10 he developed hypotension requiring peripheral levophed gtt PCCM team consulted to assist with management.   Pertinent  Medical History  Arthritis CHF COPD Type II Diabetes Mellitus HLD HTN Kidney Stones Obesity  Tachycardia  TIA   Significant Hospital Events: Including procedures, antibiotic start and stop dates in addition to other pertinent events   06/9: Pt admitted with Fournier's Gangrene, necrotizing perineal infection s/p cystoscopy, scrotal exploration and debridement  06/10: Pt developed hypotension requiring levophed gtt PCCM consulted to assist with management   Interim History / Subjective:  Pt resting comfortably with no complaints at this time   Objective   Blood pressure 92/62, pulse 96, temperature 98.3 F (36.8 C), resp. rate (!) 23, height 6\' 1"  (1.854 m), weight (!) 191 kg, SpO2 95 %.        Intake/Output Summary (Last 24 hours) at 05/19/2021 0404 Last data filed at 05/18/2021 2241 Gross per 24 hour  Intake 156.25 ml  Output 750 ml  Net -593.75 ml   Filed  Weights   05/18/21 1600  Weight: (!) 191 kg    Examination: General: acutely ill appearing male, NAD resting in bed  HENT: large neck, no JVD present  Lungs: clear throughout, even, non labored  Cardiovascular: sinus rhythm with PVC's, no R/G, 2+ radial/1+ distal Abdomen: +BS x4, obese, soft, non tender, non distended Extremities: normal bulk and tone, no edema  Neuro: alert/oriented, follows commands, PERRL GU: scrotal surgical site with dressing intact; foley in place per urology   Labs/imaging that I havepersonally reviewed  (right click and "Reselect all SmartList Selections" daily)  06/9: Na+ 131, chloride 90, glucose 154, creatinine 1.42, albumin 2.6, lactic acid 2.5, pct 2.04, wbc 17.4, hgb 11.7, platelets 413 06/9: KUB revealed large stool burden without obstruction  06/9: CT Pelvis revealed large volume of gas interspersed within a large LEFT hydrocele. Findings consistent with a gas-forming bacteria infection of the LEFT hemiscrotum. Gas extends superiorly from the LEFT hemiscrotum through the LEFT inguinal canal to level the base of the penis. Additionally gas extends into the midline perineum to level of the base of the penis. Findings consistent with Fournier's gangrene. Recommend emergent urologic surgical consultation.  Resolved Hospital Problem list   N/A  Assessment & Plan:  Septic shock secondary to fournier's gangrene, necrotizing perineal infection s/p cystoscopy, scrotal exploration and debridement~05/18/2021 Hx: HTN, HLD, Tachycardia, and TIA  Continuous telemetry monitoring  Continue gentle fluid resuscitation  Prn levophed gtt to maintain map >65 Hold outpatient antihypertensives and apixaban  Continue outpatient rosuvastatin  Trend WBC and monitor fever curve  Trend PCT  Continue zosyn and linezolid  Follow cultures  Urology consulted appreciate input  Hyponatremia  Acute renal failure secondary to hypotension  Lactic acidosis  Hx: Kidney stone   Trend BMP and lactic acid  Monitor UOP Avoid nephrotoxic medications   COPD~stable  Prn bronchodilator therapy   Type II diabetes mellitus  CBG's q4hrs  SSI   Best practice (right click and "Reselect all SmartList Selections" daily)  Diet:  NPO Pain/Anxiety/Delirium protocol (if indicated): No VAP protocol (if indicated): Not indicated DVT prophylaxis: Contraindicated GI prophylaxis: PPI Glucose control:  SSI Yes Central venous access:  N/A Arterial line:  Yes, and it is still needed Foley:  N/A Mobility:  OOB  PT consulted: Yes Last date of multidisciplinary goals of care discussion [N/A] Code Status:  full code Disposition: stepdown   Labs   CBC: Recent Labs  Lab 05/12/21 0509 05/14/21 0752 05/18/21 1607 05/18/21 2210 05/19/21 0226  WBC 17.6* 12.6* 14.0* 17.4* 22.0*  NEUTROABS 14.7* 9.8* 11.1*  --  19.5*  HGB 12.9* 12.6* 12.7* 11.7* 10.3*  HCT 37.2* 35.2* 37.9* 31.2* 31.0*  MCV 92.1 93.4 89.2 93.4 95.7  PLT 324 343 350 413* 350    Basic Metabolic Panel: Recent Labs  Lab 05/12/21 0509 05/14/21 0752 05/18/21 2210 05/19/21 0226  NA 133* 134* 131* 132*  K 4.1 3.7 4.8 4.3  CL 91* 92* 90* 90*  CO2 29 31 30 28   GLUCOSE 99 137* 154* 166*  BUN 27* 20 17 18   CREATININE 1.31* 1.06 1.42* 1.80*  CALCIUM 8.6* 8.6* 8.1* 8.0*  MG 2.5*  --   --  2.1  PHOS  --   --   --  5.1*   GFR: Estimated Creatinine Clearance: 69.1 mL/min (A) (by C-G formula based on SCr of 1.8 mg/dL (H)). Recent Labs  Lab 05/14/21 0752 05/18/21 1607 05/18/21 2210 05/19/21 0226  PROCALCITON  --   --  2.04  --   WBC 12.6* 14.0* 17.4* 22.0*  LATICACIDVEN  --  2.5*  --  3.9*    Liver Function Tests: Recent Labs  Lab 05/18/21 2210 05/19/21 0226  AST 23 20  ALT 30 28  ALKPHOS 79 68  BILITOT 1.6* 1.7*  PROT 6.7 5.9*  ALBUMIN 2.6* 2.4*   No results for input(s): LIPASE, AMYLASE in the last 168 hours. No results for input(s): AMMONIA in the last 168 hours.  ABG    Component  Value Date/Time   HCO3 25.4 07/02/2020 1136   O2SAT 91.0 07/02/2020 1136     Coagulation Profile: No results for input(s): INR, PROTIME in the last 168 hours.  Cardiac Enzymes: No results for input(s): CKTOTAL, CKMB, CKMBINDEX, TROPONINI in the last 168 hours.  HbA1C: Hemoglobin A1C  Date/Time Value Ref Range Status  06/11/2013 04:04 AM 6.7 (H) 4.2 - 6.3 % Final    Comment:    The American Diabetes Association recommends that a primary goal of therapy should be <7% and that physicians should reevaluate the treatment regimen in patients with HbA1c values consistently >8%.    Hgb A1c MFr Bld  Date/Time Value Ref Range Status  05/27/2020 05:46 AM 6.4 (H) 4.8 - 5.6 % Final    Comment:    (NOTE)         Prediabetes: 5.7 - 6.4         Diabetes: >6.4         Glycemic control for adults with diabetes: <7.0   12/08/2018 06:57 AM 5.4 4.8 - 5.6 % Final    Comment:    (NOTE) Pre diabetes:  5.7%-6.4% Diabetes:              >6.4% Glycemic control for   <7.0% adults with diabetes     CBG: Recent Labs  Lab 05/18/21 2317 05/19/21 0334  GLUCAP 172* 165*    Review of Systems: Positives in BOLD   Gen: Denies fever, chills, weight change, fatigue, night sweats HEENT: Denies blurred vision, double vision, hearing loss, tinnitus, sinus congestion, rhinorrhea, sore throat, neck stiffness, dysphagia PULM: Denies shortness of breath, cough, sputum production, hemoptysis, wheezing CV: Denies chest pain, edema, orthopnea, paroxysmal nocturnal dyspnea, palpitations GI: abdominal pain, nausea, vomiting, diarrhea, hematochezia, melena, constipation, scrotal pain/swelling, change in bowel habits GU: Denies dysuria, hematuria, polyuria, oliguria, urethral discharge Endocrine: Denies hot or cold intolerance, polyuria, polyphagia or appetite change Derm: Denies rash, dry skin, scaling or peeling skin change Heme: Denies easy bruising, bleeding, bleeding gums Neuro: Denies headache,  numbness, weakness, slurred speech, loss of memory or consciousness   Past Medical History:  He,  has a past medical history of Arthritis, CHF (congestive heart failure) (HCC), COPD (chronic obstructive pulmonary disease) (HCC), Diabetes (HCC), Hyperlipidemia, Hypertension, Kidney stone, Obesity, Tachycardia, and TIA (transient ischemic attack).   Surgical History:   Past Surgical History:  Procedure Laterality Date   CORONARY/GRAFT ACUTE MI REVASCULARIZATION N/A 12/08/2018   Procedure: Coronary/Graft Acute MI Revascularization;  Surgeon: Alwyn Peaallwood, Dwayne D, MD;  Location: [facility] INVASIVE CV LAB;  Service: Cardiovascular;  Laterality: N/A;   KIDNEY STONE SURGERY     left arm surgery  1963   LEFT HEART CATH AND CORONARY ANGIOGRAPHY N/A 12/08/2018   Procedure: LEFT HEART CATH AND CORONARY ANGIOGRAPHY;  Surgeon: Alwyn Peaallwood, Dwayne D, MD;  Location: [facility] INVASIVE CV LAB;  Service: Cardiovascular;  Laterality: N/A;   PULMONARY THROMBECTOMY N/A 08/03/2020   Procedure: PULMONARY THROMBECTOMY / THROMBOLYSIS;  Surgeon: Annice Needyew, Jason S, MD;  Location: [facility] INVASIVE CV LAB;  Service: Cardiovascular;  Laterality: N/A;     Social History:   reports that he has been smoking cigarettes. He has been smoking an average of 0.50 packs per day. He has never used smokeless tobacco. He reports that he does not drink alcohol and does not use drugs.   Family History:  His family history includes Alzheimer's disease in his mother; CAD in his father.   Allergies Allergies  Allergen Reactions   Morphine Anxiety and Other (See Comments)    Hallucinations   Morphine And Related Anxiety     Home Medications  Prior to Admission medications   Medication Sig Start Date End Date Taking? Authorizing Provider  amoxicillin-clavulanate (AUGMENTIN) 875-125 MG tablet Take 1 tablet by mouth 2 (two) times daily for 4 days. 05/17/21 05/21/21  Tresa MooreSreenath, Sudheer B, MD  apixaban (ELIQUIS) 5 MG TABS tablet Take 1 tablet (5 mg total) by  mouth 2 (two) times daily. 08/16/20 02/12/21  Theotis BurrowHarris, Taylor S, NP  famotidine (PEPCID) 20 MG tablet Take 1 tablet (20 mg total) by mouth 2 (two) times daily. 08/30/20   Carlean JewsBoscia, Heather E, NP  furosemide (LASIX) 40 MG tablet Take 1 tablet (40 mg total) by mouth daily. 05/16/21   Tresa MooreSreenath, Sudheer B, MD  Boris LownKrill Oil (OMEGA-3) 500 MG CAPS Take 1 tab po daily Patient taking differently: Take 500 mg by mouth daily. 06/04/19   Carlean JewsBoscia, Heather E, NP  lisinopril (ZESTRIL) 10 MG tablet TAKE 1 TABLET BY MOUTH ONCE DAILY 05/06/21   Lyndon CodeKhan, Fozia M, MD  metoprolol succinate (TOPROL-XL) 50 MG 24 hr tablet Take 1 tablet by  mouth once daily 04/20/21   Lyndon Code, MD  nicotine (NICODERM CQ - DOSED IN MG/24 HOURS) 21 mg/24hr patch Place 1 patch (21 mg total) onto the skin daily. 08/01/20   Carlean Jews, NP  phenylephrine-shark liver oil-mineral oil-petrolatum (PREPARATION H) 0.25-14-74.9 % rectal ointment Place 1 application rectally 2 (two) times daily as needed for hemorrhoids. 05/16/21   Sreenath, Sudheer B, MD  polyethylene glycol (MIRALAX / GLYCOLAX) 17 g packet Take 17 g by mouth daily. 05/17/21   Tresa Moore, MD  rosuvastatin (CRESTOR) 20 MG tablet Take 1 tablet (20 mg total) by mouth daily. 05/16/20   Carlean Jews, NP  senna-docusate (SENOKOT-S) 8.6-50 MG tablet Take 1 tablet by mouth 2 (two) times daily. 05/16/21   Tresa Moore, MD  VENTOLIN HFA 108 (90 Base) MCG/ACT inhaler Inhale 2 puffs into the lungs every 6 (six) hours as needed for wheezing or shortness of breath. 08/09/20   Carlean Jews, NP     Camie Patience, AGNP  Pulmonary/Critical Care Pager [phone] (please enter 7 digits) PCCM Consult Pager [phone] (please enter 7 digits)

## 2021-05-19 NOTE — Anesthesia Postprocedure Evaluation (Signed)
Anesthesia Post Note  Patient: Thomas Tanner  Procedure(s) Performed: INCISION AND DRAINAGE SCROTAL ABSCESS, cystoscopy with urethral dilation, and complex catheter placement  Patient location during evaluation: ICU Anesthesia Type: General Level of consciousness: awake, awake and alert and oriented Pain management: pain level controlled Vital Signs Assessment: post-procedure vital signs reviewed and stable Respiratory status: spontaneous breathing and patient connected to nasal cannula oxygen Cardiovascular status: blood pressure returned to baseline and stable Postop Assessment: no apparent nausea or vomiting Anesthetic complications: no   No notable events documented.   Last Vitals:  Vitals:   05/19/21 0745 05/19/21 0857  BP: (!) 102/52   Pulse: 84   Resp: (!) 25   Temp:    SpO2: 96% 92%    Last Pain:  Vitals:   05/19/21 0730  TempSrc: Oral  PainSc: 0-No pain                 Chiropodist

## 2021-05-19 NOTE — H&P (View-Only) (Signed)
   Subjective Status post scrotal exploration and debridement for Fournier's gangrene overnight Pain control improved this morning Admitted to ICU overnight for hypotension requiring pressors  Physical Exam: BP (!) 102/52   Pulse 84   Temp 97.8 F (36.6 C) (Oral)   Resp (!) 25   Ht 6' 1" (1.854 m)   Wt (!) 191 kg   SpO2 92%   BMI 55.54 kg/m    Constitutional:  Alert and oriented, No acute distress. Respiratory: Normal respiratory effort, no increased work of breathing. GI: Abdomen is soft, non-tender, non-distended, obese GU: Perineal wound left packed, no spreading erythema or purulence at skin edges Drains: Foley with light pink urine  Laboratory Data: Reviewed in epic  Assessment & Plan:   70-year-old extremely comorbid male with morbid obesity, COPD on oxygen at baseline, diabetes, PE in August 2021 on Eliquis who presented 05/18/2021 with Fournier's gangrene.  Intraoperatively also found to have diffuse urethral stricture disease requiring dilation and catheter placement over a wire.   Recovering appropriately thus far.  Agree with continuing broad-spectrum antibiotics, intraoperative tissue cultures pending.  Appreciate excellent hospitalist and ICU care.  Will plan to return to the OR tomorrow morning with Dr. Stoioff for first wound change in the setting of his Eliquis, I'm concerned for recurrent bleeding if this was attempted to be changed at the bedside, as well as significant patient discomfort.  May require additional debridement at that time as well.  -Npo at midnight-> plan for OR tomorrow for wound change and possible additional debridement with Dr. Stoioff, case discussed with him -Continue antibiotics, follow-up tissue cultures -Appreciate hospitalist and ICU care  Sylis Ketchum C Paytience Bures, MD    

## 2021-05-19 NOTE — Consult Note (Signed)
NAME: Stratton Villwock  DOB: 06/27/1951  MRN: [id number]  Date/Time: 05/19/2021 11:45 AM  REQUESTING PROVIDER: Dr. Gillie Manners Subjective:  REASON FOR CONSULT: Fournier's gangrene ? Karen Herschell Virani is a 70 y.o. male with a history of diabetes mellitus, hyperlipidemia, hypertension, CAD, obesity presented to the ED from rehab with constipation on 05/18/2021.Marland Kitchen  Staff at the rehab I tried an enema with no improvement. Patient was recently in the hospital between 05/05/2021 until 05/16/2021 for chest pain, constipation and rectal pain.  He had incidentally Bacteroides in the blood cultures in both sets which was thought to be secondary to translocation from constipation and stool burden.  He was initially given Unasyn for a day and then switched over to p.o. Augmentin.  In the ED vitals were pulse rate of 93, temperature 97.9 and sats of 95%.  Labs initially revealed WBC of 14, Hb 12.7 and platelet 350.  While being in the ED was complaining of testicular pain and discharge.  He was found to have Fournier's gangrene and an urgent urology consult was obtained and he was taken for CT pelvis which showed extensive gas in the scrotum tracking up in the left inguinal canal.  He was taken to the OR and underwent cystoscopy with urethral dilatation of urethral stricture with complex catheter placement.  There was scrotal exploration and debridement of the skin and subcutaneous tissue muscle and fascia.  More than 100 cc of purulent drainage was from the scrotum noted.  Cultures were sent.  Was  started on Zosyn and linezolid. I am asked to see the patient for the Fournier's gangrene.  Past Medical History:  Diagnosis Date   Arthritis    CHF (congestive heart failure) (HCC)    COPD (chronic obstructive pulmonary disease) (HCC)    Diabetes (HCC)    Hyperlipidemia    Hypertension    Kidney stone    Obesity    Tachycardia    TIA (transient ischemic attack)     Past Surgical History:  Procedure  Laterality Date   CORONARY/GRAFT ACUTE MI REVASCULARIZATION N/A 12/08/2018   Procedure: Coronary/Graft Acute MI Revascularization;  Surgeon: Alwyn Pea, MD;  Location: [facility];  Service: Cardiovascular;  Laterality: N/A;   KIDNEY STONE SURGERY     left arm surgery  1963   LEFT HEART CATH AND CORONARY ANGIOGRAPHY N/A 12/08/2018   Procedure: LEFT HEART CATH AND CORONARY ANGIOGRAPHY;  Surgeon: Alwyn Pea, MD;  Location: [facility];  Service: Cardiovascular;  Laterality: N/A;   PULMONARY THROMBECTOMY N/A 08/03/2020   Procedure: PULMONARY THROMBECTOMY / THROMBOLYSIS;  Surgeon: Annice Needy, MD;  Location: [facility];  Service: Cardiovascular;  Laterality: N/A;    Social History   Socioeconomic History   Marital status: Married    Spouse name: [name]    Number of children: 3   Years of education: Not on file   Highest education level: Not on file  Occupational History   Not on file  Tobacco Use   Smoking status: Every Day    Packs/day: 0.50    Pack years: 0.00    Types: Cigarettes    Last attempt to quit: 07/13/2020    Years since quitting: 0.8   Smokeless tobacco: Never  Substance and Sexual Activity   Alcohol use: No   Drug use: No   Sexual activity: Not Currently  Other Topics Concern   Not on file  Social History Narrative   Lives at home with wife ,  youngest son, grand daughter and great greatson    Social Determinants of Corporate investment banker Strain: Not on file  Food Insecurity: No Food Insecurity   Worried About Programme researcher, broadcasting/film/video in the Last Year: Never true   Barista in the Last Year: Never true  Transportation Needs: No Transportation Needs   Lack of Transportation (Medical): No   Lack of Transportation (Non-Medical): No  Physical Activity: Not on file  Stress: Not on file  Social Connections: Not on file  Intimate Partner Violence: Not on file    Family History  Problem Relation Age of Onset    Alzheimer's disease Mother    CAD Father    Allergies  Allergen Reactions   Morphine Anxiety and Other (See Comments)    Hallucinations   Morphine And Related Anxiety   I? Current Facility-Administered Medications  Medication Dose Route Frequency Provider Last Rate Last Admin   0.9 %  sodium chloride infusion  75 mL/hr Intravenous Continuous Doutova, Anastassia, MD       0.9 %  sodium chloride infusion   Intravenous Continuous Therisa Doyne, MD 75 mL/hr at 05/19/21 0932 New Bag at 05/19/21 0932   0.9 %  sodium chloride infusion  250 mL Intravenous Continuous Graves, Dana E, NP       0.9 %  sodium chloride infusion  250 mL Intravenous Continuous Graves, Rosalva Ferron, NP       acetaminophen (OFIRMEV) IV 1,000 mg  1,000 mg Intravenous Once Salena Saner, MD       acetaminophen (TYLENOL) tablet 650 mg  650 mg Oral Q6H PRN Therisa Doyne, MD       Or   acetaminophen (TYLENOL) suppository 650 mg  650 mg Rectal Q6H PRN Doutova, Anastassia, MD       albuterol (PROVENTIL) (2.5 MG/3ML) 0.083% nebulizer solution 2.5 mg  2.5 mg Nebulization Q2H PRN Doutova, Anastassia, MD       Chlorhexidine Gluconate Cloth 2 % PADS 6 each  6 each Topical Daily Mansy, Jan A, MD       fentaNYL (SUBLIMAZE) injection 50-100 mcg  50-100 mcg Intravenous Q4H PRN Salena Saner, MD       insulin aspart (novoLOG) injection 0-9 Units  0-9 Units Subcutaneous Q4H Graves, Dana E, NP   1 Units at 05/19/21 2725   ipratropium-albuterol (DUONEB) 0.5-2.5 (3) MG/3ML nebulizer solution 3 mL  3 mL Nebulization Q6H Doutova, Anastassia, MD   3 mL at 05/19/21 0857   linezolid (ZYVOX) IVPB 600 mg  600 mg Intravenous Q12H Legrand Rams C, MD 300 mL/hr at 05/19/21 1101 600 mg at 05/19/21 1101   midodrine (PROAMATINE) tablet 10 mg  10 mg Oral TID WC Harlon Ditty D, NP   10 mg at 05/19/21 1104   nicotine (NICODERM CQ - dosed in mg/24 hours) patch 21 mg  21 mg Transdermal Daily Doutova, Anastassia, MD   21 mg at 05/19/21 1057    norepinephrine (LEVOPHED) 4mg  in premix infusion  2-10 mcg/min Intravenous Titrated , NP 15 mL/hr at 05/19/21 0748 4 mcg/min at 05/19/21 0748   pantoprazole (PROTONIX) injection 40 mg  40 mg Intravenous QHS 07/19/21 C, MD   40 mg at 05/19/21 0200   piperacillin-tazobactam (ZOSYN) IVPB 4.5 g  4.5 g Intravenous Q8H Amery, 07/19/21, MD 200 mL/hr at 05/19/21 0934 4.5 g at 05/19/21 0934   polyethylene glycol (MIRALAX / GLYCOLAX) packet 17 g  17 g Oral Daily PRN  Therisa Doyne, MD       rosuvastatin (CRESTOR) tablet 20 mg  20 mg Oral Daily Doutova, [name], MD   20 mg at 05/19/21 1053   senna (SENOKOT) tablet 8.6 mg  1 tablet Oral BID Therisa Doyne, MD   8.6 mg at 05/19/21 1053   sodium hypochlorite (DAKIN'S 1/2 STRENGTH) topical solution   Irrigation UD Sondra Come, MD         Abtx:  Anti-infectives (From admission, onward)    Start     Dose/Rate Route Frequency Ordered Stop   05/19/21 0915  piperacillin-tazobactam (ZOSYN) 4.5 g in dextrose 5 % 100 mL IVPB  Status:  Discontinued        4.5 g 200 mL/hr over 30 Minutes Intravenous Every 8 hours 05/19/21 0646 05/19/21 0652   05/19/21 0915  piperacillin-tazobactam (ZOSYN) IVPB 4.5 g        4.5 g 200 mL/hr over 30 Minutes Intravenous Every 8 hours 05/19/21 0652     05/19/21 0600  ampicillin (OMNIPEN) 2 g in sodium chloride 0.9 % 100 mL IVPB  Status:  Discontinued        2 g 300 mL/hr over 20 Minutes Intravenous Every 6 hours 05/18/21 2140 05/18/21 2150   05/19/21 0115  piperacillin-tazobactam (ZOSYN) IVPB 4.5 g  Status:  Discontinued        4.5 g 200 mL/hr over 30 Minutes Intravenous Every 8 hours 05/18/21 2150 05/19/21 0646   05/18/21 2200  linezolid (ZYVOX) IVPB 600 mg        600 mg 300 mL/hr over 60 Minutes Intravenous Every 12 hours 05/18/21 2151     05/18/21 2000  ampicillin (OMNIPEN) 2 g in sodium chloride 0.9 % 100 mL IVPB        2 g 300 mL/hr over 20 Minutes Intravenous  Once 05/18/21 1951  05/18/21 2141   05/18/21 2000  gentamicin (GARAMYCIN) 250 mg in dextrose 5 % 50 mL IVPB        2 mg/kg  124.3 kg (Adjusted) 112.5 mL/hr over 30 Minutes Intravenous  Once 05/18/21 1951 05/18/21 2124       REVIEW OF SYSTEMS: pt is drowsy and sleeping after surgery last night He did wake up when I callled and siad he has had constipation Scrotal swelling , pain and discharge since yesterday No fever  Objective:  VITALS:  BP (!) 102/52   Pulse 84   Temp 97.8 F (36.6 C) (Oral)   Resp (!) 25   Ht 6\' 1"  (1.854 m)   Wt (!) 191 kg   SpO2 92%   BMI 55.54 kg/m  PHYSICAL EXAM:  General: somnolent, but oriented X3- when waking up . Morbid obesity Head: Normocephalic, without obvious abnormality, atraumatic. Eyes: Conjunctivae clear, anicteric sclerae. Pupils are equal ENT Nares normal. No drainage or sinus tenderness. Lips, mucosa, and tongue normal. No Thrush Neck: Supple,  Lungs: b/l air entry. Heart: tachycardia Abdomen: Soft, obese Scrotum not examined as there is surgical dressing Extremities: atraumatic, no cyanosis. No edema. No clubbing Skin: No rashes or lesions. Or bruising Lymph: Cervical, supraclavicular normal. Neurologic: Grossly non-focal Pertinent Labs Lab Results CBC    Component Value Date/Time   WBC 20.3 (H) 05/19/2021 0956   RBC 3.11 (L) 05/19/2021 0956   HGB 9.8 (L) 05/19/2021 0956   HGB 14.6 06/11/2013 0404   HCT 29.4 (L) 05/19/2021 0956   HCT 42.1 06/11/2013 0404   PLT 380 05/19/2021 0956   PLT 212 06/11/2013 0404   MCV  94.5 05/19/2021 0956   MCV 92 06/11/2013 0404   MCH 31.5 05/19/2021 0956   MCHC 33.3 05/19/2021 0956   RDW 14.6 05/19/2021 0956   RDW 14.4 06/11/2013 0404   LYMPHSABS 0.9 05/19/2021 0226   LYMPHSABS 2.5 06/11/2013 0404   MONOABS 1.4 (H) 05/19/2021 0226   MONOABS 0.7 06/11/2013 0404   EOSABS 0.0 05/19/2021 0226   EOSABS 0.3 06/11/2013 0404   BASOSABS 0.1 05/19/2021 0226   BASOSABS 0.1 06/11/2013 0404    CMP Latest Ref Rng  & Units 05/19/2021 05/19/2021 05/18/2021  Glucose 70 - 99 mg/dL 161(W158(H) 960(A166(H) 540(J154(H)  BUN 8 - 23 mg/dL 20 18 17   Creatinine 0.61 - 1.24 mg/dL 8.11(B1.99(H) 1.47(W1.80(H) 2.95(A1.42(H)  Sodium 135 - 145 mmol/L 133(L) 132(L) 131(L)  Potassium 3.5 - 5.1 mmol/L 4.1 4.3 4.8  Chloride 98 - 111 mmol/L 91(L) 90(L) 90(L)  CO2 22 - 32 mmol/L 30 28 30   Calcium 8.9 - 10.3 mg/dL 8.0(L) 8.0(L) 8.1(L)  Total Protein 6.5 - 8.1 g/dL - 5.9(L) 6.7  Total Bilirubin 0.3 - 1.2 mg/dL - 1.7(H) 1.6(H)  Alkaline Phos 38 - 126 U/L - 68 79  AST 15 - 41 U/L - 20 23  ALT 0 - 44 U/L - 28 30      Microbiology: Recent Results (from the past 240 hour(s))  Resp Panel by RT-PCR (Flu A&B, Covid) Nasopharyngeal Swab     Status: None   Collection Time: 05/09/21  4:54 PM   Specimen: Nasopharyngeal Swab; Nasopharyngeal(NP) swabs in vial transport medium  Result Value Ref Range Status   SARS Coronavirus 2 by RT PCR NEGATIVE NEGATIVE Final    Comment: (NOTE) SARS-CoV-2 target nucleic acids are NOT DETECTED.  The SARS-CoV-2 RNA is generally detectable in upper respiratory specimens during the acute phase of infection. The lowest concentration of SARS-CoV-2 viral copies this assay can detect is 138 copies/mL. A negative result does not preclude SARS-Cov-2 infection and should not be used as the sole basis for treatment or other patient management decisions. A negative result may occur with  improper specimen collection/handling, submission of specimen other than nasopharyngeal swab, presence of viral mutation(s) within the areas targeted by this assay, and inadequate number of viral copies(<138 copies/mL). A negative result must be combined with clinical observations, patient history, and epidemiological information. The expected result is Negative.  Fact Sheet for Patients:  BloggerCourse.comhttps://www.fda.gov/media/152166/download  Fact Sheet for Healthcare Providers:  SeriousBroker.ithttps://www.fda.gov/media/152162/download  This test is no t yet approved or  cleared by the [place] FDA and  has been authorized for detection and/or diagnosis of SARS-CoV-2 by FDA under an Emergency Use Authorization (EUA). This EUA will remain  in effect (meaning this test can be used) for the duration of the COVID-19 declaration under Section 564(b)(1) of the Act, 21 U.S.C.section 360bbb-3(b)(1), unless the authorization is terminated  or revoked sooner.       Influenza A by PCR NEGATIVE NEGATIVE Final   Influenza B by PCR NEGATIVE NEGATIVE Final    Comment: (NOTE) The Xpert Xpress SARS-CoV-2/FLU/RSV plus assay is intended as an aid in the diagnosis of influenza from Nasopharyngeal swab specimens and should not be used as a sole basis for treatment. Nasal washings and aspirates are unacceptable for Xpert Xpress SARS-CoV-2/FLU/RSV testing.  Fact Sheet for Patients: BloggerCourse.comhttps://www.fda.gov/media/152166/download  Fact Sheet for Healthcare Providers: SeriousBroker.ithttps://www.fda.gov/media/152162/download  This test is not yet approved or cleared by the [place] FDA and has been authorized for detection and/or diagnosis of SARS-CoV-2 by FDA under an Emergency  Use Authorization (EUA). This EUA will remain in effect (meaning this test can be used) for the duration of the COVID-19 declaration under Section 564(b)(1) of the Act, 21 U.S.C. section 360bbb-3(b)(1), unless the authorization is terminated or revoked.  Performed at [facility], [address] [place], [place] [address]   SARS CORONAVIRUS 2 (TAT 6-24 HRS) Nasopharyngeal Nasopharyngeal Swab     Status: None   Collection Time: 05/14/21  5:42 PM   Specimen: Nasopharyngeal Swab  Result Value Ref Range Status   SARS Coronavirus 2 NEGATIVE NEGATIVE Final    Comment: (NOTE) SARS-CoV-2 target nucleic acids are NOT DETECTED.  The SARS-CoV-2 RNA is generally detectable in upper and lower respiratory specimens during the acute phase of infection. Negative results do not preclude SARS-CoV-2  infection, do not rule out co-infections with other pathogens, and should not be used as the sole basis for treatment or other patient management decisions. Negative results must be combined with clinical observations, patient history, and epidemiological information. The expected result is Negative.  Fact Sheet for Patients: HairSlick.no  Fact Sheet for Healthcare Providers: quierodirigir.com  This test is not yet approved or cleared by the [place] FDA and  has been authorized for detection and/or diagnosis of SARS-CoV-2 by FDA under an Emergency Use Authorization (EUA). This EUA will remain  in effect (meaning this test can be used) for the duration of the COVID-19 declaration under Se ction 564(b)(1) of the Act, 21 U.S.C. section 360bbb-3(b)(1), unless the authorization is terminated or revoked sooner.  Performed at [facility], [address], [place], [place] [address]   Culture, blood (routine x 2)     Status: None (Preliminary result)   Collection Time: 05/18/21  8:23 PM   Specimen: BLOOD  Result Value Ref Range Status   Specimen Description BLOOD BLOOD LEFT FOREARM  Final   Special Requests   Final    BOTTLES DRAWN AEROBIC AND ANAEROBIC Blood Culture results may not be optimal due to an inadequate volume of blood received in culture bottles   Culture   Final    NO GROWTH < 12 HOURS Performed at [facility], [address], [place], [place] [address]    Report Status PENDING  Incomplete  Culture, blood (routine x 2)     Status: None (Preliminary result)   Collection Time: 05/18/21  8:23 PM   Specimen: BLOOD  Result Value Ref Range Status   Specimen Description BLOOD BLOOD RIGHT HAND  Final   Special Requests   Final    BOTTLES DRAWN AEROBIC AND ANAEROBIC Blood Culture adequate volume   Culture   Final    NO GROWTH < 12 HOURS Performed at [facility], [address],  [place], [place] [address]    Report Status PENDING  Incomplete  Resp Panel by RT-PCR (Flu A&B, Covid) Nasopharyngeal Swab     Status: None   Collection Time: 05/18/21  8:23 PM   Specimen: Nasopharyngeal Swab; Nasopharyngeal(NP) swabs in vial transport medium  Result Value Ref Range Status   SARS Coronavirus 2 by RT PCR NEGATIVE NEGATIVE Final    Comment: (NOTE) SARS-CoV-2 target nucleic acids are NOT DETECTED.  The SARS-CoV-2 RNA is generally detectable in upper respiratory specimens during the acute phase of infection. The lowest concentration of SARS-CoV-2 viral copies this assay can detect is 138 copies/mL. A negative result does not preclude SARS-Cov-2 infection and should not be used as the sole basis for treatment or other patient management  decisions. A negative result may occur with  improper specimen collection/handling, submission of specimen other than nasopharyngeal swab, presence of viral mutation(s) within the areas targeted by this assay, and inadequate number of viral copies(<138 copies/mL). A negative result must be combined with clinical observations, patient history, and epidemiological information. The expected result is Negative.  Fact Sheet for Patients:  BloggerCourse.com  Fact Sheet for Healthcare Providers:  SeriousBroker.it  This test is no t yet approved or cleared by the [place] FDA and  has been authorized for detection and/or diagnosis of SARS-CoV-2 by FDA under an Emergency Use Authorization (EUA). This EUA will remain  in effect (meaning this test can be used) for the duration of the COVID-19 declaration under Section 564(b)(1) of the Act, 21 U.S.C.section 360bbb-3(b)(1), unless the authorization is terminated  or revoked sooner.       Influenza A by PCR NEGATIVE NEGATIVE Final   Influenza B by PCR NEGATIVE NEGATIVE Final    Comment: (NOTE) The Xpert Xpress SARS-CoV-2/FLU/RSV plus assay is  intended as an aid in the diagnosis of influenza from Nasopharyngeal swab specimens and should not be used as a sole basis for treatment. Nasal washings and aspirates are unacceptable for Xpert Xpress SARS-CoV-2/FLU/RSV testing.  Fact Sheet for Patients: BloggerCourse.com  Fact Sheet for Healthcare Providers: SeriousBroker.it  This test is not yet approved or cleared by the [place] FDA and has been authorized for detection and/or diagnosis of SARS-CoV-2 by FDA under an Emergency Use Authorization (EUA). This EUA will remain in effect (meaning this test can be used) for the duration of the COVID-19 declaration under Section 564(b)(1) of the Act, 21 U.S.C. section 360bbb-3(b)(1), unless the authorization is terminated or revoked.  Performed at [facility], [address] [place], [place] [address]   MRSA PCR Screening     Status: None   Collection Time: 05/19/21  1:14 AM   Specimen: Nasal Mucosa; Nasopharyngeal  Result Value Ref Range Status   MRSA by PCR NEGATIVE NEGATIVE Final    Comment:        The GeneXpert MRSA Assay (FDA approved for NASAL specimens only), is one component of a comprehensive MRSA colonization surveillance program. It is not intended to diagnose MRSA infection nor to guide or monitor treatment for MRSA infections. Performed at [facility], [address] [place], [place] [address]     IMAGING RESULTS:  I have personally reviewed the films ?Large volume of gas interspersed within a large LEFT hydrocele. Findings consistent with a gas-forming bacteria infection of the LEFT hemiscrotum.   Impression/Recommendation ? Fournier's gangrene s/p I/D debridement Continue Zosyn and linezolid  Recent Bacteroides bacteremia- zosyn wound treat it Morbid obesity  COPD- management as per primary team  ? ___________________________________________________ Discussed with   requesting provider ID will follow him peripherally this weekend- call if needed Note:  This document was prepared using Dragon voice recognition software and may include unintentional dictation errors.

## 2021-05-19 NOTE — Procedures (Signed)
Central Venous Catheter Insertion Procedure Note  Thomas Tanner  [id number]  03-19-1951  Date:05/19/21  Time:3:42 PM   Provider Performing:Shantia Sanford D Elvina Sidle   Procedure: Insertion of Non-tunneled Central Venous [phone]) with US guidance (20100)   Indication(s) Medication administration and Difficult access  Consent Risks of the procedure as well as the alternatives and risks of each were explained to the patient and/or caregiver.  Consent for the procedure was obtained and is signed in the bedside chart  Anesthesia Topical only with 1% lidocaine   Timeout Verified patient identification, verified procedure, site/side was marked, verified correct patient position, special equipment/implants available, medications/allergies/relevant history reviewed, required imaging and test results available.  Sterile Technique Maximal sterile technique including full sterile barrier drape, hand hygiene, sterile gown, sterile gloves, mask, hair covering, sterile ultrasound probe cover (if used).  Procedure Description Area of catheter insertion was cleaned with chlorhexidine and draped in sterile fashion.  With real-time ultrasound guidance a central venous catheter was placed into the right internal jugular vein. Nonpulsatile blood flow and easy flushing noted in all ports.  The catheter was sutured in place and sterile dressing applied.  Complications/Tolerance None; patient tolerated the procedure well. Chest X-ray is ordered to verify placement for internal jugular or subclavian cannulation.   Chest x-ray is not ordered for femoral cannulation.  EBL Minimal  Specimen(s) None   Line secured at the 16 cm mark.  BIOPATCH applied to the insertion site.   Harlon Ditty, AGACNP-BC [facility] Prefer epic messenger for cross cover needs If after hours, please call E-link

## 2021-05-19 NOTE — Progress Notes (Signed)
OT Cancellation Note  Patient Details Name: Thomas Tanner MRN: [id number] DOB: 05/16/1951   Cancelled Treatment:    Reason Eval/Treat Not Completed: Medical issues which prohibited therapy OT attempted to work with pt this date. Noted on monitor that pt's BP was 71/40, taken on the R LE. Attempted to cycle again and cuff over-inflated to the point of coming off. When OT moved cuff to R UE, his BP was again low at 78/46. RN notified and reports she will increase Levophed. In addition, pt with c/o severe pain at this time. Will f/u for OT evaluation at later date/time as pt becomes more appropriate. Thank you.  Rejeana Brock, MS, OTR/L ascom [phone] 05/19/21, 3:31 PM

## 2021-05-19 NOTE — Progress Notes (Signed)
Consent obtained for line placement and time out performed at bedside.

## 2021-05-20 ENCOUNTER — Inpatient Hospital Stay: Payer: Medicare Other | Admitting: Anesthesiology

## 2021-05-20 ENCOUNTER — Encounter
Admission: EM | Disposition: A | Discharge status: Home Health | Payer: Self-pay | Source: Home / Self Care | Attending: Internal Medicine

## 2021-05-20 ENCOUNTER — Encounter: Payer: Self-pay | Admitting: Urology

## 2021-05-20 DIAGNOSIS — N493 Fournier gangrene: Secondary | ICD-10-CM

## 2021-05-20 HISTORY — PX: RECTAL EXAM UNDER ANESTHESIA: SHX6399

## 2021-05-20 HISTORY — PX: IRRIGATION AND DEBRIDEMENT ABSCESS: SHX5252

## 2021-05-20 LAB — CBC
HCT: 28.6 % — ABNORMAL LOW (ref 39.0–52.0)
Hemoglobin: 9.2 g/dL — ABNORMAL LOW (ref 13.0–17.0)
MCH: 29.6 pg (ref 26.0–34.0)
MCHC: 32.2 g/dL (ref 30.0–36.0)
MCV: 92 fL (ref 80.0–100.0)
Platelets: 413 10*3/uL — ABNORMAL HIGH (ref 150–400)
RBC: 3.11 MIL/uL — ABNORMAL LOW (ref 4.22–5.81)
RDW: 14.6 % (ref 11.5–15.5)
WBC: 13.4 10*3/uL — ABNORMAL HIGH (ref 4.0–10.5)
nRBC: 0 % (ref 0.0–0.2)

## 2021-05-20 LAB — GLUCOSE, CAPILLARY
Glucose-Capillary: 134 mg/dL — ABNORMAL HIGH (ref 70–99)
Glucose-Capillary: 130 mg/dL — ABNORMAL HIGH (ref 70–99)
Glucose-Capillary: 169 mg/dL — ABNORMAL HIGH (ref 70–99)
Glucose-Capillary: 161 mg/dL — ABNORMAL HIGH (ref 70–99)
Glucose-Capillary: 114 mg/dL — ABNORMAL HIGH (ref 70–99)

## 2021-05-20 SURGERY — EXAM UNDER ANESTHESIA, RECTUM
Anesthesia: General

## 2021-05-20 MED ORDER — FENTANYL CITRATE (PF) 100 MCG/2ML IJ SOLN
INTRAMUSCULAR | Status: AC
  Filled 2021-05-20: qty 2

## 2021-05-20 MED ORDER — PHENYLEPHRINE HCL (PRESSORS) 10 MG/ML IV SOLN
INTRAVENOUS | Status: DC | PRN
  Administered 2021-05-20 (×3): 200 ug via INTRAVENOUS

## 2021-05-20 MED ORDER — MIDAZOLAM HCL 2 MG/2ML IJ SOLN
INTRAMUSCULAR | Status: DC | PRN
  Administered 2021-05-20 (×2): 1 mg via INTRAVENOUS

## 2021-05-20 MED ORDER — PIPERACILLIN-TAZOBACTAM 4.5 G IVPB
4.5000 g | Freq: Three times a day (TID) | INTRAVENOUS | Status: AC
  Administered 2021-05-20 – 2021-05-22 (×8): 4.5 g via INTRAVENOUS
  Filled 2021-05-20 (×10): qty 100

## 2021-05-20 MED ORDER — ONDANSETRON HCL 4 MG/2ML IJ SOLN
INTRAMUSCULAR | Status: DC | PRN
  Administered 2021-05-20: 4 mg via INTRAVENOUS

## 2021-05-20 MED ORDER — SUCCINYLCHOLINE CHLORIDE 20 MG/ML IJ SOLN
INTRAMUSCULAR | Status: DC | PRN
  Administered 2021-05-20: 100 mg via INTRAVENOUS

## 2021-05-20 MED ORDER — PROPOFOL 10 MG/ML IV BOLUS
INTRAVENOUS | Status: DC | PRN
  Administered 2021-05-20: 150 mg via INTRAVENOUS
  Administered 2021-05-20: 50 mg via INTRAVENOUS

## 2021-05-20 MED ORDER — FENTANYL CITRATE (PF) 100 MCG/2ML IJ SOLN
INTRAMUSCULAR | Status: DC | PRN
  Administered 2021-05-20: 50 ug via INTRAVENOUS
  Administered 2021-05-20: 25 ug via INTRAVENOUS
  Administered 2021-05-20: 50 ug via INTRAVENOUS

## 2021-05-20 MED ORDER — LIDOCAINE HCL (CARDIAC) PF 100 MG/5ML IV SOSY
PREFILLED_SYRINGE | INTRAVENOUS | Status: DC | PRN
  Administered 2021-05-20: 100 mg via INTRAVENOUS

## 2021-05-20 MED ORDER — NOREPINEPHRINE 16 MG/250ML-% IV SOLN
0.0000 ug/min | INTRAVENOUS | Status: DC
  Administered 2021-05-20: 10 ug/min via INTRAVENOUS
  Administered 2021-05-20: 11 ug/min via INTRAVENOUS
  Administered 2021-05-21: 2 ug/min via INTRAVENOUS
  Filled 2021-05-20 (×3): qty 250

## 2021-05-20 MED ORDER — MIDAZOLAM HCL 2 MG/2ML IJ SOLN
INTRAMUSCULAR | Status: AC
  Filled 2021-05-20: qty 2

## 2021-05-20 SURGICAL SUPPLY — 36 items
ADH SKN CLS APL DERMABOND .7 (GAUZE/BANDAGES/DRESSINGS)
APL PRP STRL LF DISP 70% ISPRP (MISCELLANEOUS) ×1
BNDG CONFORM 2 STRL LF (GAUZE/BANDAGES/DRESSINGS) ×2 IMPLANT
CANISTER SUCT 1200ML W/VALVE (MISCELLANEOUS) ×2 IMPLANT
CHLORAPREP W/TINT 26 (MISCELLANEOUS) ×2 IMPLANT
COVER WAND RF STERILE (DRAPES) ×2 IMPLANT
DERMABOND ADVANCED (GAUZE/BANDAGES/DRESSINGS)
DERMABOND ADVANCED .7 DNX12 (GAUZE/BANDAGES/DRESSINGS) IMPLANT
DRAIN PENROSE 12X.25 LTX STRL (MISCELLANEOUS) IMPLANT
DRAPE LAPAROTOMY 77X122 PED (DRAPES) ×2 IMPLANT
DRSG GAUZE FLUFF 36X18 (GAUZE/BANDAGES/DRESSINGS) ×2 IMPLANT
ELECT REM PT RETURN 9FT ADLT (ELECTROSURGICAL) ×2
ELECTRODE REM PT RTRN 9FT ADLT (ELECTROSURGICAL) ×1 IMPLANT
GAUZE SPONGE 4X4 12PLY STRL (GAUZE/BANDAGES/DRESSINGS) ×2 IMPLANT
GLOVE SURG UNDER POLY LF SZ7.5 (GLOVE) ×2 IMPLANT
GOWN STRL REUS W/ TWL LRG LVL3 (GOWN DISPOSABLE) ×2 IMPLANT
GOWN STRL REUS W/ TWL XL LVL3 (GOWN DISPOSABLE) ×1 IMPLANT
GOWN STRL REUS W/TWL LRG LVL3 (GOWN DISPOSABLE) ×4
GOWN STRL REUS W/TWL XL LVL3 (GOWN DISPOSABLE) ×2
KIT TURNOVER KIT A (KITS) ×2 IMPLANT
LABEL OR SOLS (LABEL) IMPLANT
MANIFOLD NEPTUNE II (INSTRUMENTS) ×2 IMPLANT
NEEDLE HYPO 25X1 1.5 SAFETY (NEEDLE) IMPLANT
NS IRRIG 500ML POUR BTL (IV SOLUTION) ×2 IMPLANT
PACK BASIN MINOR ARMC (MISCELLANEOUS) ×2 IMPLANT
SOL PREP PVP 2OZ (MISCELLANEOUS) ×2
SOLUTION PREP PVP 2OZ (MISCELLANEOUS) ×1 IMPLANT
SUPPORETR ATHLETIC LG (MISCELLANEOUS) ×1 IMPLANT
SUPPORTER ATHLETIC LG (MISCELLANEOUS) ×2
SUT ETHILON 3-0 FS-10 30 BLK (SUTURE)
SUT VIC AB 3-0 SH 27 (SUTURE)
SUT VIC AB 3-0 SH 27X BRD (SUTURE) IMPLANT
SUT VIC AB 4-0 SH 27 (SUTURE)
SUT VIC AB 4-0 SH 27XANBCTRL (SUTURE) IMPLANT
SUTURE EHLN 3-0 FS-10 30 BLK (SUTURE) IMPLANT
SYR 10ML LL (SYRINGE) IMPLANT

## 2021-05-20 NOTE — Transfer of Care (Signed)
Immediate Anesthesia Transfer of Care Note  Patient: Thomas Tanner  Procedure(s) Performed: Francia Greaves UNDER ANESTHESIA-Dressing and Change IRRIGATION AND DEBRIDEMENT ABSCESS  Patient Location: PACU  Anesthesia Type:General  Level of Consciousness: awake  Airway & Oxygen Therapy: Patient Spontanous Breathing  Post-op Assessment: Report given to RN  Post vital signs: stable  Last Vitals:  Vitals Value Taken Time  BP 118/82 05/20/21 1200  Temp 36.4 C 05/20/21 1152  Pulse 102 05/20/21 1200  Resp 23 05/20/21 1200  SpO2 95 % 05/20/21 1200  Vitals shown include unvalidated device data.  Last Pain:  Vitals:   05/20/21 1152  TempSrc:   PainSc: 0-No pain         Complications: No notable events documented.

## 2021-05-20 NOTE — Progress Notes (Signed)
OT Cancellation Note  Patient Details Name: Thomas Tanner MRN: [id number] DOB: 16-Jan-1951   Cancelled Treatment:    Reason Eval/Treat Not Completed: Fatigue/lethargy limiting ability to participate Noted OT "continue at transfer" orders in chart in place as pt was in OR for debridement this AM. Upon checking with pt's RN this afternoon, he reports that pt continues to have residual effects from surgical sedation resulting in lethargy at this time. Will f/u for OT evaluation at later date/time as able/appropriate. Thank you.  Rejeana Brock, MS, OTR/L ascom [phone] 05/20/21, 4:02 PM

## 2021-05-20 NOTE — Op Note (Signed)
Preoperative diagnosis:  Fournier's gangrene  Postoperative diagnosis:  Same  Procedure: Examination under anesthesia Debridement of scrotal and perineal wounds Dressing change  Surgeon: Riki Altes, MD  Anesthesia: General  Complications: None  Intraoperative findings:  Devitalized tissue left scrotal margin-mild Upper skin edge left scrotal margin necrotic Small volume devitalized tissue wound bed left inguinal canal, scrotum and perineum Left testis viable  EBL: Minimal  Specimens: None  Indication: Thomas Tanner is a 70 y.o. male with with Fournier's gangrene and initial I&D/debridement by Dr. Richardo Hanks 05/18/2021.  He presents for second look under anesthesia with possible wound debridement and dressing change.  After reviewing the management options for treatment, he elected to proceed with the above surgical procedure(s). We have discussed the potential benefits and risks of the procedure, side effects of the proposed treatment, the likelihood of the patient achieving the goals of the procedure, and any potential problems that might occur during the procedure or recuperation. Informed consent has been obtained.   Description of procedure:  The patient was taken to the operating room and general anesthesia was induced.  The patient was placed in the dorsal lithotomy position, prepped and draped in the usual fashion.  Foley catheter was not removed.  He is receiving regular IV antibiotics as treatment. A preoperative time-out was performed.   Previously placed 3 Kerlix were removed and the wound was closely examined with findings as described above.  The necrotic scrotal skin edge was sharply excised back to fresh tissue.  All areas of devitalized tissue were sharply excised.  No significant bleeding was noted and hemostasis was obtained with cautery.  The site was copiously irrigated with saline.  The wound was then packed with 2 damp Kerlix and covered with an ABD  and mesh underwear.  He was transported to the PACU in stable condition.  Plan: Due to body habitus, depth of wound and pain do not feel adequate dressing changes can be performed bedside at this time and we will plan on repeat exam under anesthesia, possible debridement and dressing change in a.m.   Riki Altes, M.D.

## 2021-05-20 NOTE — Anesthesia Postprocedure Evaluation (Signed)
Anesthesia Post Note  Patient: Thomas Tanner  Procedure(s) Performed: EXAM UNDER ANESTHESIA-Dressing and Change IRRIGATION AND DEBRIDEMENT ABSCESS  Patient location during evaluation: PACU Anesthesia Type: General Level of consciousness: awake and alert Pain management: pain level controlled Vital Signs Assessment: post-procedure vital signs reviewed and stable Respiratory status: spontaneous breathing, nonlabored ventilation, respiratory function stable and patient connected to nasal cannula oxygen Cardiovascular status: blood pressure returned to baseline and stable Postop Assessment: no apparent nausea or vomiting Anesthetic complications: no   No notable events documented.   Last Vitals:  Vitals:   05/20/21 1500 05/20/21 1600  BP: (!) 148/71 (!) 113/51  Pulse: 99 (!) 102  Resp: 18 20  Temp:    SpO2: 98% 91%    Last Pain:  Vitals:   05/20/21 1215  TempSrc:   PainSc: 0-No pain                 Felicita Gage

## 2021-05-20 NOTE — Anesthesia Procedure Notes (Signed)
Procedure Name: Intubation Date/Time: 05/20/2021 2:07 PM Performed by: Pearla Dubonnet, CRNA Pre-anesthesia Checklist: Patient identified, Emergency Drugs available, Suction available, Patient being monitored and Timeout performed Patient Re-evaluated:Patient Re-evaluated prior to induction Oxygen Delivery Method: Circle system utilized Preoxygenation: Pre-oxygenation with 100% oxygen Induction Type: IV induction Ventilation: Two handed mask ventilation required and Oral airway inserted - appropriate to patient size Laryngoscope Size: McGraph and 4 Grade View: Grade I Tube type: Oral Tube size: 8.0 mm Number of attempts: 1 Airway Equipment and Method: Stylet Placement Confirmation: ETT inserted through vocal cords under direct vision, positive ETCO2, CO2 detector and breath sounds checked- equal and bilateral Secured at: 26 cm Tube secured with: Tape Dental Injury: Teeth and Oropharynx as per pre-operative assessment

## 2021-05-20 NOTE — Progress Notes (Signed)
PT Cancellation Note  Patient Details Name: Terek Bee MRN: [id number] DOB: October 26, 1951   Cancelled Treatment:    Reason Eval/Treat Not Completed: Patient at procedure or test/unavailable Pt out of room, unavailable for PT exam.  Will continue to follow and initiate PT exam when appropriate.  Malachi Pro. DPT 05/20/2021, 12:27 PM

## 2021-05-20 NOTE — Anesthesia Preprocedure Evaluation (Signed)
Anesthesia Evaluation  Patient identified by MRN, date of birth, ID band Patient awake    Reviewed: Allergy & Precautions, H&P , NPO status , Patient's Chart, lab work & pertinent test results, reviewed documented beta blocker date and time   Airway Mallampati: III  TM Distance: >3 FB Neck ROM: full    Dental  (+) Edentulous Upper   Pulmonary shortness of breath, with exertion and Long-Term Oxygen Therapy, asthma , COPD, Current Smoker,    Pulmonary exam normal        Cardiovascular Exercise Tolerance: Poor hypertension, On Medications + CAD, + Cardiac Stents and +CHF  Normal cardiovascular exam Rhythm:regular Rate:Normal     Neuro/Psych TIA Neuromuscular disease negative psych ROS   GI/Hepatic Neg liver ROS, GERD  Medicated,  Endo/Other  negative endocrine ROSdiabetes  Renal/GU CRFRenal disease  negative genitourinary   Musculoskeletal   Abdominal   Peds  Hematology negative hematology ROS (+)   Anesthesia Other Findings Past Medical History: No date: Arthritis No date: CHF (congestive heart failure) (HCC) No date: COPD (chronic obstructive pulmonary disease) (HCC) No date: Diabetes (HCC) No date: Hyperlipidemia No date: Hypertension No date: Kidney stone No date: Obesity No date: Tachycardia No date: TIA (transient ischemic attack) Past Surgical History: 12/08/2018: CORONARY/GRAFT ACUTE MI REVASCULARIZATION; N/A     Comment:  Procedure: Coronary/Graft Acute MI Revascularization;                Surgeon: Alwyn Pea, MD;  Location: [facility] INVASIVE              CV LAB;  Service: Cardiovascular;  Laterality: N/A; No date: KIDNEY STONE SURGERY 1963: left arm surgery 12/08/2018: LEFT HEART CATH AND CORONARY ANGIOGRAPHY; N/A     Comment:  Procedure: LEFT HEART CATH AND CORONARY ANGIOGRAPHY;                Surgeon: Alwyn Pea, MD;  Location: [facility] INVASIVE              CV LAB;  Service:  Cardiovascular;  Laterality: N/A; 08/03/2020: PULMONARY THROMBECTOMY; N/A     Comment:  Procedure: PULMONARY THROMBECTOMY / THROMBOLYSIS;                Surgeon: Annice Needy, MD;  Location: [facility] INVASIVE CV               LAB;  Service: Cardiovascular;  Laterality: N/A; BMI    Body Mass Index: 55.54 kg/m     Reproductive/Obstetrics negative OB ROS                             Anesthesia Physical  Anesthesia Plan  ASA: 4 and emergent  Anesthesia Plan: General ETT   Post-op Pain Management:    Induction:   PONV Risk Score and Plan: 2  Airway Management Planned:   Additional Equipment:   Intra-op Plan:   Post-operative Plan:   Informed Consent: I have reviewed the patients History and Physical, chart, labs and discussed the procedure including the risks, benefits and alternatives for the proposed anesthesia with the patient or authorized representative who has indicated his/her understanding and acceptance.     Dental Advisory Given  Plan Discussed with: CRNA  Anesthesia Plan Comments:         Anesthesia Quick Evaluation

## 2021-05-20 NOTE — Progress Notes (Addendum)
NAME:  Thomas Tanner, MRN:  [id number], DOB:  1951/07/10, LOS: 2 ADMISSION DATE:  05/18/2021, CONSULTATION DATE: 05/19/2021 REFERRING MD: Dr. Arville Care, CHIEF COMPLAINT: Hypotension   History of Present Illness:  This is a 70 yo male who presented to [facility] ER on 06/9 with c/o constipation, abdominal pain, and scrotal pain.  However, scrotal ultrasound and CT Pelvis revealed extensive gas in the scrotum consistent with Fournier's Gangrene, and also noted upon examination to have extensive purulent drainage from the midline of the scrotum.  ER physician consulted Urology and pt transported emergently to the OR.  He required cystoscopy; urethral dilation of urethral stricture; complex catheter placement; scrotal exploration and debridement of skin/subcutaneous tissue/muscle/ and fascia.  He was subsequently admitted to the stepdown unit per hospitalist team for additional workup and treatment.  On 06/10 he developed hypotension requiring peripheral levophed gtt PCCM team consulted to assist with management.   Pertinent  Medical History  Arthritis CHF COPD Type II Diabetes Mellitus HLD HTN Kidney Stones Obesity  Tachycardia  TIA   Significant Hospital Events: Including procedures, antibiotic start and stop dates in addition to other pertinent events   06/9: Pt admitted with Fournier's Gangrene, necrotizing perineal infection s/p cystoscopy, scrotal exploration and debridement  06/10: Pt developed hypotension requiring levophed gtt PCCM consulted to assist with management  06/11: Back to the OR for debridement, still requiring low-dose Levophed, midodrine.  Interim History / Subjective:  Taken back to the OR.  After OR no overt complaint.  Objective   Blood pressure (!) 78/52, pulse 91, temperature 97.8 F (36.6 C), temperature source Oral, resp. rate (!) 22, height 6\' 1"  (1.854 m), weight (!) 191 kg, SpO2 100 %.      Intake/Output Summary (Last 24 hours) at 05/20/2021 1059 Last  data filed at 05/20/2021 0559 Gross per 24 hour  Intake 2786.45 ml  Output 2100 ml  Net 686.45 ml    Filed Weights   05/18/21 1600  Weight: (!) 191 kg    Examination: General: Obese, acutely ill appearing male, NAD resting in bed  HENT: large neck, no JVD present  Lungs: clear throughout, even, non labored  Cardiovascular: sinus rhythm with PVC's, no R/G, 2+ radial/1+ distal Abdomen: +BS x4, obese, soft, non tender, non distended Extremities: normal bulk and tone, no edema  Neuro: alert/oriented, follows commands, PERRL GU: scrotal surgical site with dressing intact; foley in place per urology   Labs/imaging that I havepersonally reviewed  (right click and "Reselect all SmartList Selections" daily)  06/9: Na+ 131, chloride 90, glucose 154, creatinine 1.42, albumin 2.6, lactic acid 2.5, pct 2.04, wbc 17.4, hgb 11.7, platelets 413 06/9: KUB revealed large stool burden without obstruction  06/9: CT Pelvis revealed large volume of gas interspersed within a large LEFT hydrocele. Findings consistent with a gas-forming bacteria infection of the LEFT hemiscrotum. Gas extends superiorly from the LEFT hemiscrotum through the LEFT inguinal canal to level the base of the penis. Additionally gas extends into the midline perineum to level of the base of the penis. Findings consistent with Fournier's gangrene. Recommend emergent urologic surgical consultation.  Resolved Hospital Problem list   N/A  Assessment & Plan:  Septic shock secondary to fournier's gangrene, necrotizing perineal infection s/p cystoscopy, scrotal exploration and debridement~06/9 return to the OR 6/11 Severe sepsis with acute organ dysfunction Hx: HTN, HLD, Tachycardia, and TIA  Continuous telemetry monitoring  Continue gentle fluid resuscitation  Wean Levophed gtt to maintain map >65 Midodrine Hold outpatient antihypertensives and apixaban  Continue outpatient rosuvastatin  Trend WBC and monitor fever curve  Trend PCT   Continue zosyn and linezolid  Follow cultures  Urology consulted appreciate input   Hyponatremia  Acute renal failure secondary to hypotension  Lactic acidosis - resolved Hx: Kidney stone  Trend BMP and lactic acid  Monitor UOP Avoid nephrotoxic medications  Renal function stabilizing  COPD~stable  PRN bronchodilator therapy   Type II diabetes mellitus  CBG's q4hrs  SSI   Best practice (right click and "Reselect all SmartList Selections" daily)  Diet:  NPO due to OR, clear liquids otherwise Pain/Anxiety/Delirium protocol (if indicated): No VAP protocol (if indicated): Not indicated DVT prophylaxis: Contraindicated GI prophylaxis: PPI Glucose control:  SSI Yes Central venous access:  N/A Arterial line:  Yes, and it is still needed Foley:  N/A Mobility:  OOB  PT consulted: Yes Last date of multidisciplinary goals of care discussion [N/A] Code Status:  full code Disposition: ICU  Labs   CBC: Recent Labs  Lab 05/14/21 0752 05/18/21 1607 05/18/21 2210 05/19/21 0226 05/19/21 0956 05/19/21 1616 05/19/21 2147 05/20/21 0551  WBC 12.6* 14.0*   < > 22.0* 20.3* 17.3* 14.7* 13.4*  NEUTROABS 9.8* 11.1*  --  19.5*  --   --   --   --   HGB 12.6* 12.7*   < > 10.3* 9.8* 10.0* 9.4* 9.2*  HCT 35.2* 37.9*   < > 31.0* 29.4* 30.5* 29.0* 28.6*  MCV 93.4 89.2   < > 95.7 94.5 92.7 92.1 92.0  PLT 343 350   < > 350 380 393 395 413*   < > = values in this interval not displayed.     Basic Metabolic Panel: Recent Labs  Lab 05/14/21 0752 05/18/21 2210 05/19/21 0226 05/19/21 0533  NA 134* 131* 132* 133*  K 3.7 4.8 4.3 4.1  CL 92* 90* 90* 91*  CO2 31 30 28 30   GLUCOSE 137* 154* 166* 158*  BUN 20 17 18 20   CREATININE 1.06 1.42* 1.80* 1.99*  CALCIUM 8.6* 8.1* 8.0* 8.0*  MG  --   --  2.1  --   PHOS  --   --  5.1*  --     GFR: Estimated Creatinine Clearance: 62.5 mL/min (A) (by C-G formula based on SCr of 1.99 mg/dL (H)). Recent Labs  Lab 05/18/21 1607 05/18/21 2210  05/19/21 0226 05/19/21 0533 05/19/21 0956 05/19/21 1616 05/19/21 2147 05/20/21 0551  PROCALCITON  --  2.04  --   --   --   --   --   --   WBC 14.0* 17.4* 22.0*  --  20.3* 17.3* 14.7* 13.4*  LATICACIDVEN 2.5*  --  3.9* 2.0*  --   --   --   --      Liver Function Tests: Recent Labs  Lab 05/18/21 2210 05/19/21 0226  AST 23 20  ALT 30 28  ALKPHOS 79 68  BILITOT 1.6* 1.7*  PROT 6.7 5.9*  ALBUMIN 2.6* 2.4*    No results for input(s): LIPASE, AMYLASE in the last 168 hours. No results for input(s): AMMONIA in the last 168 hours.  ABG    Component Value Date/Time   HCO3 25.4 07/02/2020 1136   O2SAT 91.0 07/02/2020 1136      Coagulation Profile: No results for input(s): INR, PROTIME in the last 168 hours.  Cardiac Enzymes: No results for input(s): CKTOTAL, CKMB, CKMBINDEX, TROPONINI in the last 168 hours.  HbA1C: Hemoglobin A1C  Date/Time Value Ref Range Status  06/11/2013  04:04 AM 6.7 (H) 4.2 - 6.3 % Final    Comment:    The American Diabetes Association recommends that a primary goal of therapy should be <7% and that physicians should reevaluate the treatment regimen in patients with HbA1c values consistently >8%.    Hgb A1c MFr Bld  Date/Time Value Ref Range Status  05/19/2021 05:33 AM 6.4 (H) 4.8 - 5.6 % Final    Comment:    (NOTE)         Prediabetes: 5.7 - 6.4         Diabetes: >6.4         Glycemic control for adults with diabetes: <7.0   05/27/2020 05:46 AM 6.4 (H) 4.8 - 5.6 % Final    Comment:    (NOTE)         Prediabetes: 5.7 - 6.4         Diabetes: >6.4         Glycemic control for adults with diabetes: <7.0     CBG: Recent Labs  Lab 05/19/21 1547 05/19/21 1926 05/19/21 2319 05/20/21 0322 05/20/21 0744  GLUCAP 150* 137* 163* 134* 130*     Review of Systems: Positives in BOLD   Gen: Denies fever, chills, weight change, fatigue, night sweats HEENT: Denies blurred vision, double vision, hearing loss, tinnitus, sinus congestion,  rhinorrhea, sore throat, neck stiffness, dysphagia PULM: Denies shortness of breath, cough, sputum production, hemoptysis, wheezing CV: Denies chest pain, edema, orthopnea, paroxysmal nocturnal dyspnea, palpitations GI: abdominal pain, nausea, vomiting, diarrhea, hematochezia, melena, constipation, scrotal pain/swelling, change in bowel habits GU: Denies dysuria, hematuria, polyuria, oliguria, urethral discharge Endocrine: Denies hot or cold intolerance, polyuria, polyphagia or appetite change Derm: Denies rash, dry skin, scaling or peeling skin change Heme: Denies easy bruising, bleeding, bleeding gums Neuro: Denies headache, numbness, weakness, slurred speech, loss of memory or consciousness   Allergies Allergies  Allergen Reactions   Morphine Anxiety and Other (See Comments)    Hallucinations   Morphine And Related Anxiety     Home Medications  Prior to Admission medications   Medication Sig Start Date End Date Taking? Authorizing Provider  amoxicillin-clavulanate (AUGMENTIN) 875-125 MG tablet Take 1 tablet by mouth 2 (two) times daily for 4 days. 05/17/21 05/21/21  Tresa Moore, MD  apixaban (ELIQUIS) 5 MG TABS tablet Take 1 tablet (5 mg total) by mouth 2 (two) times daily. 08/16/20 02/12/21  Theotis Burrow, NP  famotidine (PEPCID) 20 MG tablet Take 1 tablet (20 mg total) by mouth 2 (two) times daily. 08/30/20   Carlean Jews, NP  furosemide (LASIX) 40 MG tablet Take 1 tablet (40 mg total) by mouth daily. 05/16/21   Tresa Moore, MD  Boris Lown Oil (OMEGA-3) 500 MG CAPS Take 1 tab po daily Patient taking differently: Take 500 mg by mouth daily. 06/04/19   Carlean Jews, NP  lisinopril (ZESTRIL) 10 MG tablet TAKE 1 TABLET BY MOUTH ONCE DAILY 05/06/21   Lyndon Code, MD  metoprolol succinate (TOPROL-XL) 50 MG 24 hr tablet Take 1 tablet by mouth once daily 04/20/21   Lyndon Code, MD  nicotine (NICODERM CQ - DOSED IN MG/24 HOURS) 21 mg/24hr patch Place 1 patch (21 mg total) onto  the skin daily. 08/01/20   Carlean Jews, NP  phenylephrine-shark liver oil-mineral oil-petrolatum (PREPARATION H) 0.25-14-74.9 % rectal ointment Place 1 application rectally 2 (two) times daily as needed for hemorrhoids. 05/16/21   Tresa Moore, MD  polyethylene glycol [facility] /  GLYCOLAX) 17 g packet Take 17 g by mouth daily. 05/17/21   Tresa Moore, MD  rosuvastatin (CRESTOR) 20 MG tablet Take 1 tablet (20 mg total) by mouth daily. 05/16/20   Carlean Jews, NP  senna-docusate (SENOKOT-S) 8.6-50 MG tablet Take 1 tablet by mouth 2 (two) times daily. 05/16/21   Tresa Moore, MD  VENTOLIN HFA 108 (90 Base) MCG/ACT inhaler Inhale 2 puffs into the lungs every 6 (six) hours as needed for wheezing or shortness of breath. 08/09/20   Carlean Jews, NP     Current MAR reviewed with clinical pharmacist.  The patient is critically ill with multiple organ systems failure and requires high complexity decision making for assessment and support, frequent evaluation and titration of therapies, application of advanced monitoring technologies and extensive interpretation of multiple databases. Critical Care Time devoted to patient care services described in this note is 35 minutes.   Gailen Shelter, MD [place] [facility]   *This note was dictated using voice recognition software/Dragon.  Despite best efforts to proofread, errors can occur which can change the meaning.  Any change was purely unintentional.

## 2021-05-20 NOTE — Anesthesia Procedure Notes (Signed)
Procedure Name: Intubation Date/Time: 05/20/2021 10:47 AM Performed by: Pearla Dubonnet, CRNA Pre-anesthesia Checklist: Patient identified, Emergency Drugs available, Patient being monitored and Timeout performed Patient Re-evaluated:Patient Re-evaluated prior to induction Oxygen Delivery Method: Circle system utilized Preoxygenation: Pre-oxygenation with 100% oxygen Induction Type: IV induction Ventilation: Oral airway inserted - appropriate to patient size and Two handed mask ventilation required Laryngoscope Size: McGraph and 4 Grade View: Grade I Tube type: Oral Tube size: 8.0 mm Number of attempts: 1 Airway Equipment and Method: Stylet Placement Confirmation: ETT inserted through vocal cords under direct vision, positive ETCO2 and breath sounds checked- equal and bilateral Secured at: 26 cm Tube secured with: Tape Dental Injury: Teeth and Oropharynx as per pre-operative assessment

## 2021-05-20 NOTE — Interval H&P Note (Signed)
History and Physical Interval Note:  No acute events overnight.  For exam under anesthesia, dressing change and possible debridement today.  05/20/2021 12:32 PM  Thomas Tanner  has presented today for surgery, with the diagnosis of scrotal abscess.  The various methods of treatment have been discussed with the patient and family. After consideration of risks, benefits and other options for treatment, the patient has consented to  Procedure(s): EXAM UNDER ANESTHESIA-Dressing and Change (N/A) IRRIGATION AND DEBRIDEMENT ABSCESS (N/A) as a surgical intervention.  The patient's history has been reviewed, patient examined, no change in status, stable for surgery.  I have reviewed the patient's chart and labs.  Questions were answered to the patient's satisfaction.     [name] C [name]

## 2021-05-21 ENCOUNTER — Inpatient Hospital Stay: Payer: Medicare Other | Admitting: Anesthesiology

## 2021-05-21 ENCOUNTER — Encounter
Admission: EM | Disposition: A | Discharge status: Home Health | Payer: Medicare Other | Source: Home / Self Care | Attending: Internal Medicine

## 2021-05-21 ENCOUNTER — Encounter: Payer: Self-pay | Admitting: Urology

## 2021-05-21 HISTORY — PX: IRRIGATION AND DEBRIDEMENT ABSCESS: SHX5252

## 2021-05-21 LAB — BASIC METABOLIC PANEL
Anion gap: 6 (ref 5–15)
BUN: 12 mg/dL (ref 8–23)
CO2: 33 mmol/L — ABNORMAL HIGH (ref 22–32)
Calcium: 7.8 mg/dL — ABNORMAL LOW (ref 8.9–10.3)
Chloride: 94 mmol/L — ABNORMAL LOW (ref 98–111)
Creatinine, Ser: 1.37 mg/dL — ABNORMAL HIGH (ref 0.61–1.24)
GFR, Estimated: 56 mL/min — ABNORMAL LOW (ref >60)
Glucose, Bld: 135 mg/dL — ABNORMAL HIGH (ref 70–99)
Potassium: 4 mmol/L (ref 3.5–5.1)
Sodium: 133 mmol/L — ABNORMAL LOW (ref 135–145)

## 2021-05-21 LAB — URINALYSIS, COMPLETE (UACMP) WITH MICROSCOPIC
Bacteria, UA: NONE SEEN
Bilirubin Urine: NEGATIVE
Glucose, UA: NEGATIVE mg/dL
Ketones, ur: NEGATIVE mg/dL
Leukocytes,Ua: NEGATIVE
Nitrite: NEGATIVE
Protein, ur: NEGATIVE mg/dL
Specific Gravity, Urine: 1.016 (ref 1.005–1.030)
Squamous Epithelial / HPF: NONE SEEN (ref 0–5)
pH: 6 (ref 5.0–8.0)

## 2021-05-21 LAB — CBC
HCT: 24.4 % — ABNORMAL LOW (ref 39.0–52.0)
Hemoglobin: 8.8 g/dL — ABNORMAL LOW (ref 13.0–17.0)
MCH: 34.6 pg — ABNORMAL HIGH (ref 26.0–34.0)
MCHC: 36.1 g/dL — ABNORMAL HIGH (ref 30.0–36.0)
MCV: 96.1 fL (ref 80.0–100.0)
Platelets: 412 10*3/uL — ABNORMAL HIGH (ref 150–400)
RBC: 2.54 MIL/uL — ABNORMAL LOW (ref 4.22–5.81)
RDW: 14.7 % (ref 11.5–15.5)
WBC: 8.5 10*3/uL (ref 4.0–10.5)
nRBC: 0 % (ref 0.0–0.2)

## 2021-05-21 LAB — MAGNESIUM: Magnesium: 2 mg/dL (ref 1.7–2.4)

## 2021-05-21 LAB — GLUCOSE, CAPILLARY
Glucose-Capillary: 111 mg/dL — ABNORMAL HIGH (ref 70–99)
Glucose-Capillary: 136 mg/dL — ABNORMAL HIGH (ref 70–99)
Glucose-Capillary: 138 mg/dL — ABNORMAL HIGH (ref 70–99)
Glucose-Capillary: 147 mg/dL — ABNORMAL HIGH (ref 70–99)
Glucose-Capillary: 183 mg/dL — ABNORMAL HIGH (ref 70–99)
Glucose-Capillary: 193 mg/dL — ABNORMAL HIGH (ref 70–99)
Glucose-Capillary: 206 mg/dL — ABNORMAL HIGH (ref 70–99)

## 2021-05-21 LAB — PHOSPHORUS: Phosphorus: 2.8 mg/dL (ref 2.5–4.6)

## 2021-05-21 SURGERY — IRRIGATION AND DEBRIDEMENT ABSCESS
Anesthesia: General

## 2021-05-21 MED ORDER — FENTANYL CITRATE (PF) 100 MCG/2ML IJ SOLN
INTRAMUSCULAR | Status: AC
  Filled 2021-05-21: qty 2

## 2021-05-21 MED ORDER — ETOMIDATE 2 MG/ML IV SOLN
INTRAVENOUS | Status: DC | PRN
  Administered 2021-05-21: 12 mg via INTRAVENOUS

## 2021-05-21 MED ORDER — GLYCOPYRROLATE 0.2 MG/ML IJ SOLN
INTRAMUSCULAR | Status: DC | PRN
  Administered 2021-05-21: .2 mg via INTRAVENOUS

## 2021-05-21 MED ORDER — FENTANYL CITRATE (PF) 100 MCG/2ML IJ SOLN
INTRAMUSCULAR | Status: DC | PRN
  Administered 2021-05-21: 50 ug via INTRAVENOUS

## 2021-05-21 MED ORDER — PROPOFOL 500 MG/50ML IV EMUL
INTRAVENOUS | Status: AC
  Filled 2021-05-21: qty 50

## 2021-05-21 MED ORDER — IPRATROPIUM-ALBUTEROL 0.5-2.5 (3) MG/3ML IN SOLN
3.0000 mL | Freq: Two times a day (BID) | RESPIRATORY_TRACT | Status: DC
  Administered 2021-05-21 – 2021-05-24 (×6): 3 mL via RESPIRATORY_TRACT
  Filled 2021-05-21 (×6): qty 3

## 2021-05-21 MED ORDER — ETOMIDATE 2 MG/ML IV SOLN
INTRAVENOUS | Status: AC
  Filled 2021-05-21: qty 10

## 2021-05-21 MED ORDER — ONDANSETRON HCL 4 MG/2ML IJ SOLN
INTRAMUSCULAR | Status: DC | PRN
  Administered 2021-05-21: 4 mg via INTRAVENOUS

## 2021-05-21 MED ORDER — PHENYLEPHRINE HCL (PRESSORS) 10 MG/ML IV SOLN
INTRAVENOUS | Status: DC | PRN
  Administered 2021-05-21 (×3): 200 ug via INTRAVENOUS

## 2021-05-21 MED ORDER — DEXAMETHASONE SODIUM PHOSPHATE 10 MG/ML IJ SOLN
INTRAMUSCULAR | Status: DC | PRN
  Administered 2021-05-21: 10 mg via INTRAVENOUS

## 2021-05-21 MED ORDER — LIDOCAINE HCL (CARDIAC) PF 100 MG/5ML IV SOSY
PREFILLED_SYRINGE | INTRAVENOUS | Status: DC | PRN
  Administered 2021-05-21: 100 mg via INTRAVENOUS

## 2021-05-21 MED ORDER — MIDAZOLAM HCL 2 MG/2ML IJ SOLN
INTRAMUSCULAR | Status: AC
  Filled 2021-05-21: qty 2

## 2021-05-21 MED ORDER — EPHEDRINE SULFATE 50 MG/ML IJ SOLN
INTRAMUSCULAR | Status: DC | PRN
  Administered 2021-05-21: 10 mg via INTRAVENOUS

## 2021-05-21 MED ORDER — PROPOFOL 10 MG/ML IV BOLUS
INTRAVENOUS | Status: DC | PRN
  Administered 2021-05-21: 30 mg via INTRAVENOUS

## 2021-05-21 MED ORDER — SUCCINYLCHOLINE CHLORIDE 20 MG/ML IJ SOLN
INTRAMUSCULAR | Status: DC | PRN
  Administered 2021-05-21: 120 mg via INTRAVENOUS

## 2021-05-21 MED ORDER — PROPOFOL 10 MG/ML IV BOLUS
INTRAVENOUS | Status: AC
  Filled 2021-05-21: qty 20

## 2021-05-21 SURGICAL SUPPLY — 36 items
ADH SKN CLS APL DERMABOND .7 (GAUZE/BANDAGES/DRESSINGS) ×1
APL PRP STRL LF DISP 70% ISPRP (MISCELLANEOUS) ×1
BNDG CONFORM 2 STRL LF (GAUZE/BANDAGES/DRESSINGS) ×2 IMPLANT
CANISTER SUCT 1200ML W/VALVE (MISCELLANEOUS) ×2 IMPLANT
CHLORAPREP W/TINT 26 (MISCELLANEOUS) ×2 IMPLANT
COVER WAND RF STERILE (DRAPES) ×2 IMPLANT
DERMABOND ADVANCED (GAUZE/BANDAGES/DRESSINGS) ×1
DERMABOND ADVANCED .7 DNX12 (GAUZE/BANDAGES/DRESSINGS) ×1 IMPLANT
DRAIN PENROSE 12X.25 LTX STRL (MISCELLANEOUS) ×2 IMPLANT
DRAPE LAPAROTOMY 77X122 PED (DRAPES) ×2 IMPLANT
DRSG GAUZE FLUFF 36X18 (GAUZE/BANDAGES/DRESSINGS) ×2 IMPLANT
ELECT REM PT RETURN 9FT ADLT (ELECTROSURGICAL) ×2
ELECTRODE REM PT RTRN 9FT ADLT (ELECTROSURGICAL) ×1 IMPLANT
GAUZE SPONGE 4X4 12PLY STRL (GAUZE/BANDAGES/DRESSINGS) ×2 IMPLANT
GLOVE SURG UNDER POLY LF SZ7.5 (GLOVE) ×2 IMPLANT
GOWN STRL REUS W/ TWL LRG LVL3 (GOWN DISPOSABLE) ×2 IMPLANT
GOWN STRL REUS W/ TWL XL LVL3 (GOWN DISPOSABLE) ×1 IMPLANT
GOWN STRL REUS W/TWL LRG LVL3 (GOWN DISPOSABLE) ×4
GOWN STRL REUS W/TWL XL LVL3 (GOWN DISPOSABLE) ×2
KIT TURNOVER KIT A (KITS) ×2 IMPLANT
LABEL OR SOLS (LABEL) ×2 IMPLANT
MANIFOLD NEPTUNE II (INSTRUMENTS) ×2 IMPLANT
NEEDLE HYPO 25X1 1.5 SAFETY (NEEDLE) IMPLANT
NS IRRIG 500ML POUR BTL (IV SOLUTION) ×2 IMPLANT
PACK BASIN MINOR ARMC (MISCELLANEOUS) ×2 IMPLANT
SOL PREP PVP 2OZ (MISCELLANEOUS) ×2
SOLUTION PREP PVP 2OZ (MISCELLANEOUS) ×1 IMPLANT
SUPPORETR ATHLETIC LG (MISCELLANEOUS) ×1 IMPLANT
SUPPORTER ATHLETIC LG (MISCELLANEOUS) ×2
SUT ETHILON 3-0 FS-10 30 BLK (SUTURE)
SUT VIC AB 3-0 SH 27 (SUTURE)
SUT VIC AB 3-0 SH 27X BRD (SUTURE) IMPLANT
SUT VIC AB 4-0 SH 27 (SUTURE)
SUT VIC AB 4-0 SH 27XANBCTRL (SUTURE) IMPLANT
SUTURE EHLN 3-0 FS-10 30 BLK (SUTURE) IMPLANT
SYR 10ML LL (SYRINGE) IMPLANT

## 2021-05-21 NOTE — Transfer of Care (Signed)
Immediate Anesthesia Transfer of Care Note  Patient: Lemar Lofty Deridder  Procedure(s) Performed: IRRIGATION AND DEBRIDEMENT ABSCESS  Patient Location: PACU  Anesthesia Type:General  Level of Consciousness: awake, drowsy and patient cooperative  Airway & Oxygen Therapy: Patient Spontanous Breathing and Patient connected to face mask oxygen  Post-op Assessment: Report given to RN and Post -op Vital signs reviewed and stable  Post vital signs: Reviewed and stable  Last Vitals:  Vitals Value Taken Time  BP 102/49 05/21/21 1200  Temp 36.1 C 05/21/21 1136  Pulse 97 05/21/21 1231  Resp 17 05/21/21 1231  SpO2 88 % 05/21/21 1231  Vitals shown include unvalidated device data.  Last Pain:  Vitals:   05/21/21 1216  TempSrc:   PainSc: 8       Patients Stated Pain Goal: 0 (05/20/21 0721)  Complications: No notable events documented.

## 2021-05-21 NOTE — Interval H&P Note (Signed)
History and Physical Interval Note:  For dressing change, possible debridement  05/21/2021 11:30 AM  Thomas Tanner  has presented today for surgery, with the diagnosis of scrotal abscess.  The various methods of treatment have been discussed with the patient and family. After consideration of risks, benefits and other options for treatment, the patient has consented to  Procedure(s): IRRIGATION AND DEBRIDEMENT ABSCESS (N/A) as a surgical intervention.  The patient's history has been reviewed, patient examined, no change in status, stable for surgery.  I have reviewed the patient's chart and labs.  Questions were answered to the patient's satisfaction.     [name] C Debbe Crumble

## 2021-05-21 NOTE — Progress Notes (Signed)
OT Cancellation Note  Patient Details Name: Canuto Kingston MRN: [id number] DOB: March 17, 1951   Cancelled Treatment:    Reason Eval/Treat Not Completed: Patient at procedure or test/ unavailable. Pt in OR again this AM, for inspection/debridement of scrotal and perineal wounds, under general anesthesia. Will hold OT evaluation today and follow up at later date/time, as pt is available and medically appropriate.   Latina Craver, PhD, MS, OTR/L 05/21/21, 10:39 AM

## 2021-05-21 NOTE — Consult Note (Signed)
Pharmacy Antibiotic Note  Thomas Tanner is a 70 y.o. male with medical history including morbid obesity, CHF, COPD on oxygen at home, saddle PE in 07/2020 on apixaban, CAD, HTN admitted on 05/18/2021 with  Fournier's gangrene .  Pharmacy has been consulted for Zosyn dosing. Patient is also ordered linezolid.  Emergent OR 6/9 for scrotal exploration and drainage.  Plan: Zosyn 4.5 g IV q8h (4-hr infusion) --Opt for higher dose Zosyn given patient's body habitus (BMI 55) and severity of infection.  Follow-up OR cultures.  Height: 6\' 1"  (185.4 cm) Weight: (!) 191 kg (421 lb) IBW/kg (Calculated) : 79.9  Temp (24hrs), Avg:97.6 F (36.4 C), Min:97.1 F (36.2 C), Max:97.9 F (36.6 C)  Recent Labs  Lab 05/18/21 1607 05/18/21 2210 05/19/21 0226 05/19/21 0533 05/19/21 0956 05/19/21 1616 05/19/21 2147 05/20/21 0551 05/21/21 0500  WBC 14.0* 17.4* 22.0*  --  20.3* 17.3* 14.7* 13.4* 8.5  CREATININE  --  1.42* 1.80* 1.99*  --   --   --   --  1.37*  LATICACIDVEN 2.5*  --  3.9* 2.0*  --   --   --   --   --      Estimated Creatinine Clearance: 90.7 mL/min (A) (by C-G formula based on SCr of 1.37 mg/dL (H)).    Allergies  Allergen Reactions   Morphine Anxiety and Other (See Comments)    Hallucinations   Morphine And Related Anxiety    Antimicrobials this admission: Ampicillin & Gentamicin 6/9 x 1 dose Zosyn 6/9 >> Linezolid 6/9 >>  Dose adjustments this admission: N/A  Microbiology results: 6/9 OR cultures: FEW GRAM VARIABLE ROD  RARE GRAM POSITIVE COCCI IN PAIRS 6/9 BCx: pending 6/9 MRSA PCR: negative  Thank you for allowing pharmacy to be a part of this patient's care.  8/9 05/21/2021 9:54 AM

## 2021-05-21 NOTE — Progress Notes (Signed)
PT Cancellation Note  Patient Details Name: Thomas Tanner MRN: [id number] DOB: Sep 27, 1951   Cancelled Treatment:    Reason Eval/Treat Not Completed: Other (comment) Pt with another scrotal I&D this AM, spoke with nursing and cleared for PT.  Nurse and PT encouraging mobility/activity but pt simply in too much pain; "I feel like if I move they are going to explode."  Acknowledges that he needs to start being active, but "today is not that day buddy."  Malachi Pro, DPT 05/21/2021, 3:57 PM

## 2021-05-21 NOTE — Anesthesia Preprocedure Evaluation (Addendum)
Anesthesia Evaluation  Patient identified by MRN, date of birth, ID band Patient awake  General Assessment Comment: Prior Grade 1 view with Mcgrath 4 blade  Reviewed: Allergy & Precautions, H&P , NPO status , Patient's Chart, lab work & pertinent test results, reviewed documented beta blocker date and time   History of Anesthesia Complications Negative for: history of anesthetic complications  Airway Mallampati: III  TM Distance: >3 FB Neck ROM: full    Dental  (+) Edentulous Upper   Pulmonary shortness of breath, with exertion and Long-Term Oxygen Therapy, asthma , COPD, Current Smoker and Patient abstained from smoking.,     + decreased breath sounds      Cardiovascular Exercise Tolerance: Poor hypertension, On Medications + CAD, + Cardiac Stents and +CHF  Normal cardiovascular exam Rhythm:regular Rate:Normal  TTE 05/19/21: 1. Left ventricular ejection fraction, by estimation, is 55 to 60%. The  left ventricle has normal function. The left ventricle has no regional  wall motion abnormalities. Left ventricular diastolic function could not  be evaluated.  2. Right ventricular systolic function was not well visualized. The right  ventricular size is not well visualized.  3. The mitral valve was not well visualized. No evidence of mitral valve  regurgitation.  4. The aortic valve was not well visualized. Aortic valve regurgitation  is not visualized.    Neuro/Psych TIA Neuromuscular disease negative psych ROS   GI/Hepatic Neg liver ROS, GERD  Medicated,  Endo/Other  diabetesMorbid obesity  Renal/GU CRFRenal disease  negative genitourinary   Musculoskeletal   Abdominal (+) + obese,   Peds  Hematology negative hematology ROS (+)   Anesthesia Other Findings Past Medical History: No date: Arthritis No date: CHF (congestive heart failure) (HCC) No date: COPD (chronic obstructive pulmonary disease) (HCC) No date:  Diabetes (HCC) No date: Hyperlipidemia No date: Hypertension No date: Kidney stone No date: Obesity No date: Tachycardia No date: TIA (transient ischemic attack) Past Surgical History: 12/08/2018: CORONARY/GRAFT ACUTE MI REVASCULARIZATION; N/A     Comment:  Procedure: Coronary/Graft Acute MI Revascularization;                Surgeon: Alwyn Pea, MD;  Location: [facility] INVASIVE              CV LAB;  Service: Cardiovascular;  Laterality: N/A; No date: KIDNEY STONE SURGERY 1963: left arm surgery 12/08/2018: LEFT HEART CATH AND CORONARY ANGIOGRAPHY; N/A     Comment:  Procedure: LEFT HEART CATH AND CORONARY ANGIOGRAPHY;                Surgeon: Alwyn Pea, MD;  Location: [facility] INVASIVE              CV LAB;  Service: Cardiovascular;  Laterality: N/A; 08/03/2020: PULMONARY THROMBECTOMY; N/A     Comment:  Procedure: PULMONARY THROMBECTOMY / THROMBOLYSIS;                Surgeon: Annice Needy, MD;  Location: [facility] INVASIVE CV               LAB;  Service: Cardiovascular;  Laterality: N/A; BMI    Body Mass Index: 55.54 kg/m     Reproductive/Obstetrics negative OB ROS                            Anesthesia Physical  Anesthesia Plan  ASA: 4  Anesthesia Plan: General   Post-op Pain Management:    Induction: Intravenous  PONV  Risk Score and Plan: 3 and Ondansetron, Dexamethasone and Treatment may vary due to age or medical condition  Airway Management Planned: Oral ETT and Video Laryngoscope Planned  Additional Equipment: None  Intra-op Plan:   Post-operative Plan: Extubation in OR and Post-operative intubation/ventilation  Informed Consent: I have reviewed the patients History and Physical, chart, labs and discussed the procedure including the risks, benefits and alternatives for the proposed anesthesia with the patient or authorized representative who has indicated his/her understanding and acceptance.     Dental advisory given  Plan Discussed  with: CRNA and Surgeon  Anesthesia Plan Comments: (Discussed risks of anesthesia with patient, including PONV, sore throat, lip/dental damage. Rare risks discussed as well, such as cardiorespiratory and neurological sequelae, prolonged intubation. Patient understands. Patient counseled on being higher risk for anesthesia due to comorbidities: morbid obesity, acute illness/fournier's. Patient was told about increased risk of cardiac and respiratory events, including death. Patient understands. )        Anesthesia Quick Evaluation

## 2021-05-21 NOTE — Progress Notes (Signed)
NAME:  Thomas Tanner, MRN:  [id number], DOB:  1951-01-27, LOS: 3 ADMISSION DATE:  05/18/2021, INITIAL CONSULTATION DATE: 05/19/2021 REFERRING MD: Dr. Arville Care, CHIEF COMPLAINT: Hypotension   History of Present Illness:  This is a 70 yo male who presented to [facility] ER on 06/9 with c/o constipation, abdominal pain, and scrotal pain.  However, scrotal ultrasound and CT Pelvis revealed extensive gas in the scrotum consistent with Fournier's Gangrene, and also noted upon examination to have extensive purulent drainage from the midline of the scrotum.  ER physician consulted Urology and pt transported emergently to the OR.  He required cystoscopy; urethral dilation of urethral stricture; complex catheter placement; scrotal exploration and debridement of skin/subcutaneous tissue/muscle/ and fascia.  He was subsequently admitted to the stepdown unit per hospitalist team for additional workup and treatment.  On 06/10 he developed hypotension requiring peripheral levophed gtt PCCM team consulted to assist with management.   Pertinent  Medical History  Arthritis CHF COPD Type II Diabetes Mellitus HLD HTN Kidney Stones Obesity  Tachycardia  TIA   Significant Hospital Events: Including procedures, antibiotic start and stop dates in addition to other pertinent events   06/9: Pt admitted with Fournier's Gangrene, necrotizing perineal infection s/p cystoscopy, scrotal exploration and debridement  06/10: Pt developed hypotension requiring levophed gtt PCCM consulted to assist with management  06/11: Back to the OR for debridement, still requiring low-dose Levophed, midodrine 06/12: Back to the OR for debridement, Levophed weaned off  Interim History / Subjective:  Taken back to the OR.  After OR no overt complaint.  Pain well controlled.  Objective   Blood pressure (!) 102/54, pulse 97, temperature 97.6 F (36.4 C), resp. rate 20, height 6\' 1"  (1.854 m), weight (!) 191 kg, SpO2 92 %.       Intake/Output Summary (Last 24 hours) at 05/21/2021 1823 Last data filed at 05/21/2021 1400 Gross per 24 hour  Intake 1213.13 ml  Output 1640 ml  Net -426.87 ml    Filed Weights   05/18/21 1600  Weight: (!) 191 kg    Examination: General: Obese, acutely ill appearing male, NAD resting in bed  HENT: large neck, no JVD present  Lungs: clear throughout, even, non labored  Cardiovascular: sinus rhythm with PVC's, no R/G, 2+ radial/1+ distal Abdomen: +BS x4, obese, soft, non tender, non distended Extremities: normal bulk and tone, no edema  Neuro: alert/oriented, follows commands, PERRL GU: scrotal surgical site with dressing intact; foley in place per urology   Labs/imaging that I havepersonally reviewed  (right click and "Reselect all SmartList Selections" daily)  06/9: Na+ 131, chloride 90, glucose 154, creatinine 1.42, albumin 2.6, lactic acid 2.5, pct 2.04, wbc 17.4, hgb 11.7, platelets 413 06/9: KUB revealed large stool burden without obstruction  06/9: CT Pelvis revealed large volume of gas interspersed within a large LEFT hydrocele. Findings consistent with a gas-forming bacteria infection of the LEFT hemiscrotum. Gas extends superiorly from the LEFT hemiscrotum through the LEFT inguinal canal to level the base of the penis. Additionally gas extends into the midline perineum to level of the base of the penis. Findings consistent with Fournier's gangrene. Recommend emergent urologic surgical consultation.  Resolved Hospital Problem list   N/A  Assessment & Plan:  Septic shock secondary to fournier's gangrene, necrotizing perineal infection s/p cystoscopy, scrotal exploration and debridement~06/9 ,6/11, 612 Severe sepsis with acute organ dysfunction Hx: HTN, HLD, Tachycardia, and TIA  Continuous telemetry monitoring  Continue gentle fluid resuscitation  Wean Levophed gtt to maintain  map >65 Midodrine Hold outpatient antihypertensives and apixaban  Continue outpatient  rosuvastatin  Trend WBC and monitor fever curve  Trend PCT  Continue zosyn and linezolid  Follow cultures : Polymicrobial abscess, predominant organism group A (strep pyogenes)  Urology consulted appreciate input   Hyponatremia  Acute renal failure secondary to hypotension  Lactic acidosis - resolved Hx: Kidney stone  Trend BMP and lactic acid  Monitor UOP Avoid nephrotoxic medications  Renal function stabilizing  COPD~stable  PRN bronchodilator therapy   Type II diabetes mellitus  CBG's q4hrs  SSI   Best practice (right click and "Reselect all SmartList Selections" daily)  Diet:  NPO due to OR, clear liquids otherwise Pain/Anxiety/Delirium protocol (if indicated): No VAP protocol (if indicated): Not indicated DVT prophylaxis: Contraindicated GI prophylaxis: PPI Glucose control:  SSI Yes Central venous access:  N/A Arterial line:  Yes, and it is still needed Foley:  N/A Mobility:  OOB  PT consulted: Yes Last date of multidisciplinary goals of care discussion [N/A] Code Status:  full code Disposition: Stepdown  Labs   CBC: Recent Labs  Lab 05/18/21 1607 05/18/21 2210 05/19/21 0226 05/19/21 0956 05/19/21 1616 05/19/21 2147 05/20/21 0551 05/21/21 0500  WBC 14.0*   < > 22.0* 20.3* 17.3* 14.7* 13.4* 8.5  NEUTROABS 11.1*  --  19.5*  --   --   --   --   --   HGB 12.7*   < > 10.3* 9.8* 10.0* 9.4* 9.2* 8.8*  HCT 37.9*   < > 31.0* 29.4* 30.5* 29.0* 28.6* 24.4*  MCV 89.2   < > 95.7 94.5 92.7 92.1 92.0 96.1  PLT 350   < > 350 380 393 395 413* 412*   < > = values in this interval not displayed.     Basic Metabolic Panel: Recent Labs  Lab 05/18/21 2210 05/19/21 0226 05/19/21 0533 05/21/21 0500  NA 131* 132* 133* 133*  K 4.8 4.3 4.1 4.0  CL 90* 90* 91* 94*  CO2 30 28 30  33*  GLUCOSE 154* 166* 158* 135*  BUN 17 18 20 12   CREATININE 1.42* 1.80* 1.99* 1.37*  CALCIUM 8.1* 8.0* 8.0* 7.8*  MG  --  2.1  --  2.0  PHOS  --  5.1*  --  2.8    GFR: Estimated  Creatinine Clearance: 90.7 mL/min (A) (by C-G formula based on SCr of 1.37 mg/dL (H)). Recent Labs  Lab 05/18/21 1607 05/18/21 2210 05/19/21 0226 05/19/21 0533 05/19/21 0956 05/19/21 1616 05/19/21 2147 05/20/21 0551 05/21/21 0500  PROCALCITON  --  2.04  --   --   --   --   --   --   --   WBC 14.0* 17.4* 22.0*  --    < > 17.3* 14.7* 13.4* 8.5  LATICACIDVEN 2.5*  --  3.9* 2.0*  --   --   --   --   --    < > = values in this interval not displayed.     Liver Function Tests: Recent Labs  Lab 05/18/21 2210 05/19/21 0226  AST 23 20  ALT 30 28  ALKPHOS 79 68  BILITOT 1.6* 1.7*  PROT 6.7 5.9*  ALBUMIN 2.6* 2.4*    No results for input(s): LIPASE, AMYLASE in the last 168 hours. No results for input(s): AMMONIA in the last 168 hours.  ABG    Component Value Date/Time   HCO3 25.4 07/02/2020 1136   O2SAT 91.0 07/02/2020 1136      Coagulation Profile:  No results for input(s): INR, PROTIME in the last 168 hours.  Cardiac Enzymes: No results for input(s): CKTOTAL, CKMB, CKMBINDEX, TROPONINI in the last 168 hours.  HbA1C: Hemoglobin A1C  Date/Time Value Ref Range Status  06/11/2013 04:04 AM 6.7 (H) 4.2 - 6.3 % Final    Comment:    The American Diabetes Association recommends that a primary goal of therapy should be <7% and that physicians should reevaluate the treatment regimen in patients with HbA1c values consistently >8%.    Hgb A1c MFr Bld  Date/Time Value Ref Range Status  05/19/2021 05:33 AM 6.4 (H) 4.8 - 5.6 % Final    Comment:    (NOTE)         Prediabetes: 5.7 - 6.4         Diabetes: >6.4         Glycemic control for adults with diabetes: <7.0   05/27/2020 05:46 AM 6.4 (H) 4.8 - 5.6 % Final    Comment:    (NOTE)         Prediabetes: 5.7 - 6.4         Diabetes: >6.4         Glycemic control for adults with diabetes: <7.0     CBG: Recent Labs  Lab 05/21/21 0008 05/21/21 0354 05/21/21 0745 05/21/21 1151 05/21/21 1539  GLUCAP 147* 111* 138*  136* 206*     Review of Systems: Positives in BOLD   Gen: Denies fever, chills, weight change, fatigue, night sweats HEENT: Denies blurred vision, double vision, hearing loss, tinnitus, sinus congestion, rhinorrhea, sore throat, neck stiffness, dysphagia PULM: Denies shortness of breath, cough, sputum production, hemoptysis, wheezing CV: Denies chest pain, edema, orthopnea, paroxysmal nocturnal dyspnea, palpitations GI: abdominal pain, nausea, vomiting, diarrhea, hematochezia, melena, constipation, scrotal pain/swelling, change in bowel habits GU: Denies dysuria, hematuria, polyuria, oliguria, urethral discharge Endocrine: Denies hot or cold intolerance, polyuria, polyphagia or appetite change Derm: Denies rash, dry skin, scaling or peeling skin change Heme: Denies easy bruising, bleeding, bleeding gums Neuro: Denies headache, numbness, weakness, slurred speech, loss of memory or consciousness   Allergies Allergies  Allergen Reactions   Morphine Anxiety and Other (See Comments)    Hallucinations   Morphine And Related Anxiety    Scheduled Meds:  Chlorhexidine Gluconate Cloth  6 each Topical Daily   insulin aspart  0-9 Units Subcutaneous Q4H   ipratropium-albuterol  3 mL Nebulization BID   midodrine  10 mg Oral TID WC   nicotine  21 mg Transdermal Daily   pantoprazole (PROTONIX) IV  40 mg Intravenous QHS   rosuvastatin  20 mg Oral Daily   senna  1 tablet Oral BID   sodium hypochlorite   Irrigation UD   Continuous Infusions:  sodium chloride 75 mL/hr at 05/21/21 1000   sodium chloride     sodium chloride     linezolid (ZYVOX) IV Stopped (05/21/21 1327)   norepinephrine (LEVOPHED) Adult infusion 8 mcg/min (05/21/21 1000)   piperacillin-tazobactam (ZOSYN)  IV 4.5 g (05/21/21 1609)   PRN Meds:.acetaminophen **OR** acetaminophen, albuterol, fentaNYL (SUBLIMAZE) injection, polyethylene glycol  Patient tolerating of norepinephrine.  Continue midodrine.  Antibiotics.  Plan to  transfer to [facility] in the morning.  Currently stepdown status.  Hopefully to MedSurg in the morning.  Level 3 follow-up   C. Danice Goltz, MD [place] [facility]   *This note was dictated using voice recognition software/Dragon.  Despite best efforts to proofread, errors can occur which can change the meaning.  Any change was  purely unintentional.

## 2021-05-21 NOTE — Op Note (Signed)
Preoperative diagnosis:  Fournier's gangrene  Postoperative diagnosis:  Same  Procedure: Examination under anesthesia with wound debridement Dressing change  Surgeon: Riki Altes, MD  Anesthesia: General  Complications: None  Intraoperative findings:  Minimal debridement wound bed required  EBL: Minimal  Specimens: None  Indication: Thomas Tanner is a 70 y.o. patient with Fournier's gangrene who presents for exam under anesthesia with dressing change and possible wound debridement.  After reviewing the management options for treatment, he elected to proceed with the above surgical procedure(s). We have discussed the potential benefits and risks of the procedure, side effects of the proposed treatment, the likelihood of the patient achieving the goals of the procedure, and any potential problems that might occur during the procedure or recuperation. Informed consent has been obtained.  Description of procedure:  The patient was taken to the operating room and general anesthesia was induced on his ICU bed.  Preoperative antibiotics were not administered as he is on regular antibiotic dosing.  A preoperative time-out was performed.  He was not transferred to the operating table and was placed in the frog-leg position on the ICU bed.  The packing was removed.  The wound bed was beefy and only 1 area in the right perineal area required minimal debridement.   The wound was then repacked with partial rolls of Kerlix x2 and covered with an ABD and mesh underwear  After anesthetic reversal he was transported to the PACU in stable condition.  Plan: Bedside dressing change 05/22/2021   Riki Altes, M.D.

## 2021-05-21 NOTE — Anesthesia Procedure Notes (Signed)
Procedure Name: Intubation Date/Time: 05/21/2021 10:37 AM Performed by: Mohammed Kindle, CRNA Pre-anesthesia Checklist: Patient identified, Emergency Drugs available, Suction available and Patient being monitored Patient Re-evaluated:Patient Re-evaluated prior to induction Oxygen Delivery Method: Circle system utilized Preoxygenation: Pre-oxygenation with 100% oxygen Induction Type: IV induction Ventilation: Mask ventilation without difficulty Laryngoscope Size: McGraph and 4 Grade View: Grade I Tube type: Oral Tube size: 7.5 mm Number of attempts: 1 Airway Equipment and Method: Stylet and Oral airway Placement Confirmation: ETT inserted through vocal cords under direct vision, positive ETCO2, breath sounds checked- equal and bilateral and CO2 detector Secured at: 21 cm Tube secured with: Tape Dental Injury: Teeth and Oropharynx as per pre-operative assessment

## 2021-05-21 NOTE — Anesthesia Postprocedure Evaluation (Signed)
Anesthesia Post Note  Patient: Thomas Tanner  Procedure(s) Performed: IRRIGATION AND DEBRIDEMENT ABSCESS  Patient location during evaluation: ICU Anesthesia Type: General Level of consciousness: awake and alert Pain management: pain level controlled Vital Signs Assessment: post-procedure vital signs reviewed and stable Respiratory status: spontaneous breathing, nonlabored ventilation, respiratory function stable and patient connected to nasal cannula oxygen Cardiovascular status: blood pressure returned to baseline and stable Postop Assessment: no apparent nausea or vomiting Anesthetic complications: no   No notable events documented.   Last Vitals:  Vitals:   05/21/21 1115 05/21/21 1136  BP: (!) 94/49 (!) 103/54  Pulse: (!) 109 (!) 106  Resp: (!) 26 17  Temp:  (!) 36.1 C  SpO2: 93% 93%    Last Pain:  Vitals:   05/21/21 1216  TempSrc:   PainSc: 8                  Corinda Gubler

## 2021-05-22 ENCOUNTER — Encounter: Payer: Self-pay | Admitting: Urology

## 2021-05-22 DIAGNOSIS — L0291 Cutaneous abscess, unspecified: Secondary | ICD-10-CM

## 2021-05-22 LAB — CBC
HCT: 24.7 % — ABNORMAL LOW (ref 39.0–52.0)
Hemoglobin: 7.8 g/dL — ABNORMAL LOW (ref 13.0–17.0)
MCH: 29.5 pg (ref 26.0–34.0)
MCHC: 31.6 g/dL (ref 30.0–36.0)
MCV: 93.6 fL (ref 80.0–100.0)
Platelets: 313 10*3/uL (ref 150–400)
RBC: 2.64 MIL/uL — ABNORMAL LOW (ref 4.22–5.81)
RDW: 14.3 % (ref 11.5–15.5)
WBC: 5.3 10*3/uL (ref 4.0–10.5)
nRBC: 0 % (ref 0.0–0.2)

## 2021-05-22 LAB — BASIC METABOLIC PANEL
Anion gap: 5 (ref 5–15)
BUN: 10 mg/dL (ref 8–23)
CO2: 31 mmol/L (ref 22–32)
Calcium: 7.7 mg/dL — ABNORMAL LOW (ref 8.9–10.3)
Chloride: 96 mmol/L — ABNORMAL LOW (ref 98–111)
Creatinine, Ser: 1.37 mg/dL — ABNORMAL HIGH (ref 0.61–1.24)
GFR, Estimated: 56 mL/min — ABNORMAL LOW (ref >60)
Glucose, Bld: 144 mg/dL — ABNORMAL HIGH (ref 70–99)
Potassium: 4.6 mmol/L (ref 3.5–5.1)
Sodium: 132 mmol/L — ABNORMAL LOW (ref 135–145)

## 2021-05-22 LAB — GLUCOSE, CAPILLARY
Glucose-Capillary: 115 mg/dL — ABNORMAL HIGH (ref 70–99)
Glucose-Capillary: 121 mg/dL — ABNORMAL HIGH (ref 70–99)
Glucose-Capillary: 125 mg/dL — ABNORMAL HIGH (ref 70–99)
Glucose-Capillary: 132 mg/dL — ABNORMAL HIGH (ref 70–99)
Glucose-Capillary: 143 mg/dL — ABNORMAL HIGH (ref 70–99)
Glucose-Capillary: 149 mg/dL — ABNORMAL HIGH (ref 70–99)

## 2021-05-22 LAB — PHOSPHORUS: Phosphorus: 2.2 mg/dL — ABNORMAL LOW (ref 2.5–4.6)

## 2021-05-22 LAB — MAGNESIUM: Magnesium: 2.2 mg/dL (ref 1.7–2.4)

## 2021-05-22 MED ORDER — SODIUM CHLORIDE 0.9 % IV SOLN
3.0000 g | Freq: Four times a day (QID) | INTRAVENOUS | Status: DC
  Administered 2021-05-23 – 2021-05-26 (×14): 3 g via INTRAVENOUS
  Filled 2021-05-22: qty 8
  Filled 2021-05-22 (×3): qty 3
  Filled 2021-05-22: qty 8
  Filled 2021-05-22 (×2): qty 3
  Filled 2021-05-22 (×2): qty 8
  Filled 2021-05-22: qty 3
  Filled 2021-05-22: qty 8
  Filled 2021-05-22: qty 3
  Filled 2021-05-22 (×2): qty 8
  Filled 2021-05-22: qty 3
  Filled 2021-05-22: qty 8
  Filled 2021-05-22 (×2): qty 3
  Filled 2021-05-22: qty 8
  Filled 2021-05-22: qty 3
  Filled 2021-05-22 (×2): qty 8

## 2021-05-22 NOTE — Evaluation (Signed)
Occupational Therapy Evaluation Patient Details Name: Thomas Tanner MRN: [id number] DOB: [birth year] Today's Date: 05/22/2021    History of Present Illness Thomas Tanner is a 70 y.o. male with history of CHF, COPD on 3-1/2 L home oxygen, tobacco use disorder, recent saddle pulmonary embolism in August 2021, CAD, hypertension, chronic right sided low back pain with sciatica, who was admitted from SNF with Fournier's Gangrene, necrotizing perineal infection s/p cystoscopy, scrotal exploration and debridement.   Clinical Impression   Pt was seen for OT evaluation this date. Prior to hospital admission, pt was recently at rehab after previous hospitalization and reports he had not yet started therapy. Pt lives with his spouse, son  and grandson but has assist from other grandkids nearby. Currently pt demonstrates impairments as described below (See OT problem list) which functionally limit his ability to perform ADL/self-care tasks. Pt currently requires MIN A +2 for rolling to R side x3 with breaks in between each attempt. Pt declined EOB trials 2/2 scrotal pain and R hip pain. Pt educated in benefits of mobility, role of therapy, instructed in BUE exercises and repositioning in bed for comfort and pressure relief. Pt verbalized understanding and demo'd ability to reposition slightly. Pt would benefit from skilled OT services to address noted impairments and functional limitations (see below for any additional details) in order to maximize safety and independence while minimizing falls risk and caregiver burden. Upon hospital discharge, recommend return to short term rehab at SNF to maximize pt safety and return to PLOF.     Follow Up Recommendations  SNF    Equipment Recommendations  Other (comment) (TBD at next venue, anticipate benefit of bariatric BSC)    Recommendations for Other Services       Precautions / Restrictions Precautions Precautions:  Fall Precaution Comments: scrotal incisions Restrictions Weight Bearing Restrictions: No      Mobility Bed Mobility Overal bed mobility: Needs Assistance Bed Mobility: Rolling Rolling: Min assist;+2 for physical assistance         General bed mobility comments: Pt rolled to R side x3 with brief rest breaks in between, MIN A x2 to complete    Transfers                 General transfer comment: pt declined 2/2 scrotal and R hip pain    Balance                                           ADL either performed or assessed with clinical judgement   ADL Overall ADL's : Needs assistance/impaired                                       General ADL Comments: Pt requires set up and supervision for bed level grooming, MIN A for UB dressing/bathing, MAX A for LB dressing/bathing, and MAX A for bed level toileting     Vision Baseline Vision/History: Wears glasses Wears Glasses: At all times Patient Visual Report: No change from baseline       Perception     Praxis      Pertinent Vitals/Pain Pain Assessment: 0-10 Pain Score: 8  Pain Location: R hip (chronic), and scrotum/incision after bed mobility Pain Descriptors / Indicators: Aching Pain Intervention(s): Limited activity within patient's tolerance;Monitored during session;Premedicated before  session;Repositioned     Hand Dominance Right   Extremity/Trunk Assessment Upper Extremity Assessment Upper Extremity Assessment: Overall WFL for tasks assessed   Lower Extremity Assessment Lower Extremity Assessment: Generalized weakness (hx R hip arthritis pain)       Communication Communication Communication: No difficulties   Cognition Arousal/Alertness: Awake/alert Behavior During Therapy: WFL for tasks assessed/performed Overall Cognitive Status: Within Functional Limits for tasks assessed                                     General Comments  scrotal bandage in  place, noted drainage on chuck, RN notified    Exercises Other Exercises Other Exercises: Pt educated in benefits of mobility, role of therapy, instructed in BUE exercises and repositioning in bed for comfort and pressure relief   Shoulder Instructions      Home Living Family/patient expects to be discharged to:: Private residence Living Arrangements: Spouse/significant other Available Help at Discharge: Family;Available 24 hours/day Type of Home: House Home Access: Stairs to enter [employer] of Steps: 6 Entrance Stairs-Rails: Can reach both Home Layout: One level     Bathroom Shower/Tub: Chief Strategy Officer: Standard     Home Equipment: Environmental consultant - 4 wheels;Shower seat;Grab bars - tub/shower;Bedside commode;Cane - single point          Prior Functioning/Environment Level of Independence: Needs assistance  Gait / Transfers Assistance Needed: Per chart and pt report, pt using lift chair for sit<>stand transfer. Reports only using RW if needed ADL's / Homemaking Assistance Needed: Per chart and pt, required assist for LB dressing and bathing, was driving , on 3.5 L O2 at home, and indep with med mgt            OT Problem List: Decreased strength;Decreased range of motion;Decreased activity tolerance;Impaired balance (sitting and/or standing);Decreased safety awareness;Increased edema;Pain;Obesity;Decreased knowledge of use of DME or AE      OT Treatment/Interventions: Self-care/ADL training;Therapeutic exercise;Energy conservation;DME and/or AE instruction;Therapeutic activities;Patient/family education;Balance training    OT Goals(Current goals can be found in the care plan section) Acute Rehab OT Goals Patient Stated Goal: to get back to walking OT Goal Formulation: With patient Time For Goal Achievement: 06/05/21 Potential to Achieve Goals: Good ADL Goals Pt Will Perform Lower Body Dressing: with mod assist;sitting/lateral leans Pt Will  Transfer to Toilet: with max assist;stand pivot transfer;bedside commode (LRAD for amb) Additional ADL Goal #1: Pt will perform bed mobility with MIN A in anticipation of seated EOB ADL tasks. Additional ADL Goal #2: Pt will perform seated grooming tasks EOB with set up and supervision, tolerating sitting EOB for >16min.  OT Frequency: Min 1X/week   Barriers to D/C:            Co-evaluation PT/OT/SLP Co-Evaluation/Treatment: Yes Reason for Co-Treatment: For patient/therapist safety;To address functional/ADL transfers PT goals addressed during session: Mobility/safety with mobility;Strengthening/ROM OT goals addressed during session: ADL's and self-care;Strengthening/ROM      AM-PAC OT "6 Clicks" Daily Activity     Outcome Measure Help from another person eating meals?: None Help from another person taking care of personal grooming?: A Little Help from another person toileting, which includes using toliet, bedpan, or urinal?: A Lot Help from another person bathing (including washing, rinsing, drying)?: A Lot Help from another person to put on and taking off regular upper body clothing?: A Little Help from another person to put on and taking off  regular lower body clothing?: A Lot 6 Click Score: 16   End of Session Nurse Communication: Mobility status;Other (comment) (O2 initially off, on 3L at end of session)  Activity Tolerance: Patient tolerated treatment well Patient left: in bed;with call bell/phone within reach;with bed alarm set  OT Visit Diagnosis: Other abnormalities of gait and mobility (R26.89);Pain Pain - Right/Left: Right Pain - part of body: Hip (and scrotum)                Time: 8502-7741 OT Time Calculation (min): 45 min Charges:  OT General Charges $OT Visit: 1 Visit OT Evaluation $OT Eval Moderate Complexity: 1 Mod OT Treatments $Self Care/Home Management : 8-22 mins  Wynona Canes, MPH, MS, OTR/L ascom [phone] 05/22/21, 1:15 PM

## 2021-05-22 NOTE — Evaluation (Signed)
Physical Therapy Evaluation Patient Details Name: Thomas Tanner MRN: [id number] DOB: December 11, 1950 Today's Date: 05/22/2021   History of Present Illness  Thomas Tanner is a 70 y.o. male with history of CHF, COPD on 3-1/2 L home oxygen, tobacco use disorder, recent saddle pulmonary embolism in August 2021, CAD, hypertension, chronic right sided low back pain with sciatica, who was admitted from SNF with Fournier's Gangrene, necrotizing perineal infection s/p cystoscopy, scrotal exploration and debridement.  Clinical Impression  Pt is a pleasant 70 year old male who was admitted for perineal infection s/p exploration and debridement. Presents from SNF where he was there for therapy. Pt performs bed mobility including rolling x min assist +2. Unable to perform OOB mobility this date. Monitored O2 with sats decreasing on RA (84%). Reapplied 3L with O2 sats at 96%. Pt demonstrates deficits with strength/mobility/pain. Consider further sessions timing with pain meds. Would benefit from skilled PT to address above deficits and promote optimal return to PLOF; recommend transition to STR upon discharge from acute hospitalization.     Follow Up Recommendations SNF    Equipment Recommendations  None recommended by PT    Recommendations for Other Services       Precautions / Restrictions Precautions Precautions: Fall Precaution Comments: scrotal incisions Restrictions Weight Bearing Restrictions: No      Mobility  Bed Mobility Overal bed mobility: Needs Assistance Bed Mobility: Rolling Rolling: Min assist;+2 for physical assistance         General bed mobility comments: Pt rolled to R side x3 with brief rest breaks in between, MIN A x2 to complete. Needed legs spread apart during rolling due to scrotal pain    Transfers                 General transfer comment: pt declined 2/2 scrotal and R hip pain  Ambulation/Gait                Stairs             Wheelchair Mobility    Modified Rankin (Stroke Patients Only)       Balance                                             Pertinent Vitals/Pain Pain Assessment: 0-10 Pain Score: 8  Pain Location: R hip (chronic), and scrotum/incision after bed mobility Pain Descriptors / Indicators: Aching Pain Intervention(s): Limited activity within patient's tolerance;Premedicated before session    Home Living Family/patient expects to be discharged to:: Private residence Living Arrangements: Spouse/significant other Available Help at Discharge: Family;Available 24 hours/day Type of Home: House Home Access: Stairs to enter Entrance Stairs-Rails: Can reach both Entrance Stairs-Number of Steps: 6 Home Layout: One level Home Equipment: Walker - 4 wheels;Shower seat;Grab bars - tub/shower;Bedside commode;Cane - single point      Prior Function Level of Independence: Needs assistance   Gait / Transfers Assistance Needed: Per chart and pt report, pt using lift chair for sit<>stand transfer. Reports only using RW if needed  ADL's / Homemaking Assistance Needed: Per chart and pt, required assist for LB dressing and bathing, was driving , on 3.5 L O2 at home, and indep with med mgt        Hand Dominance   Dominant Hand: Right    Extremity/Trunk Assessment   Upper Extremity Assessment Upper Extremity Assessment: Defer to OT evaluation  Lower Extremity Assessment Lower Extremity Assessment: Generalized weakness (R hip pain; R LE grossly 3+/5)       Communication   Communication: No difficulties  Cognition Arousal/Alertness: Awake/alert Behavior During Therapy: WFL for tasks assessed/performed Overall Cognitive Status: Within Functional Limits for tasks assessed                                        General Comments General comments (skin integrity, edema, etc.): scrotal bandage in place, noted drainage on chuck, RN notified    Exercises  Other Exercises Other Exercises: Pt educated in benefits of mobility, role of therapy, instructed in BUE exercises and repositioning in bed for comfort and pressure relief Other Exercises: educated on HEP and reviewed for correct technique   Assessment/Plan    PT Assessment Patient needs continued PT services  PT Problem List Decreased strength;Decreased mobility;Decreased activity tolerance;Decreased balance       PT Treatment Interventions Therapeutic activities;Gait training;Therapeutic exercise;Functional mobility training;Balance training;Stair training;Patient/family education    PT Goals (Current goals can be found in the Care Plan section)  Acute Rehab PT Goals Patient Stated Goal: to get back to walking PT Goal Formulation: With patient Time For Goal Achievement: 06/05/21 Potential to Achieve Goals: Fair    Frequency Min 2X/week   Barriers to discharge        Co-evaluation PT/OT/SLP Co-Evaluation/Treatment: Yes Reason for Co-Treatment: Complexity of the patient's impairments (multi-system involvement);For patient/therapist safety;To address functional/ADL transfers PT goals addressed during session: Mobility/safety with mobility;Strengthening/ROM         AM-PAC PT "6 Clicks" Mobility  Outcome Measure Help needed turning from your back to your side while in a flat bed without using bedrails?: A Little Help needed moving from lying on your back to sitting on the side of a flat bed without using bedrails?: A Lot Help needed moving to and from a bed to a chair (including a wheelchair)?: A Lot Help needed standing up from a chair using your arms (e.g., wheelchair or bedside chair)?: A Lot Help needed to walk in hospital room?: A Lot Help needed climbing 3-5 steps with a railing? : Total 6 Click Score: 12    End of Session Equipment Utilized During Treatment: Oxygen Activity Tolerance: Patient tolerated treatment well Patient left: in bed;with call bell/phone within  reach;with bed alarm set Nurse Communication: Mobility status PT Visit Diagnosis: Unsteadiness on feet (R26.81);Muscle weakness (generalized) (M62.81);Difficulty in walking, not elsewhere classified (R26.2)    Time: 0347-4259 PT Time Calculation (min) (ACUTE ONLY): 41 min   Charges:   PT Evaluation $PT Eval Low Complexity: 1 Low PT Treatments $Therapeutic Activity: 8-22 mins        Elizabeth Palau, PT, DPT [phone]   Rumaisa Schnetzer 05/22/2021, 5:02 PM

## 2021-05-22 NOTE — Progress Notes (Signed)
NAME:  Thomas Tanner, MRN:  [id number], DOB:  [birth year], LOS: 4 ADMISSION DATE:  05/18/2021, CONSULTATION DATE: 05/19/2021 REFERRING MD: Dr. Arville Care, CHIEF COMPLAINT: Hypotension   History of Present Illness:  This is a 70 yo male who presented to [facility] ER on 06/9 with c/o constipation, abdominal pain, and scrotal pain.  However, scrotal ultrasound and CT Pelvis revealed extensive gas in the scrotum consistent with Fournier's Gangrene, and also noted upon examination to have extensive purulent drainage from the midline of the scrotum.  ER physician consulted Urology and pt transported emergently to the OR.  He required cystoscopy; urethral dilation of urethral stricture; complex catheter placement; scrotal exploration and debridement of skin/subcutaneous tissue/muscle/ and fascia.  He was subsequently admitted to the stepdown unit per hospitalist team for additional workup and treatment.  On 06/10 he developed hypotension requiring peripheral levophed gtt PCCM team consulted to assist with management.   Pertinent  Medical History  Arthritis CHF COPD Type II Diabetes Mellitus HLD HTN Kidney Stones Obesity  Tachycardia  TIA   Significant Hospital Events: Including procedures, antibiotic start and stop dates in addition to other pertinent events   06/9: Pt admitted with Fournier's Gangrene, necrotizing perineal infection s/p cystoscopy, scrotal exploration and debridement  06/10: Pt developed hypotension requiring levophed gtt PCCM consulted to assist with management  06/11: Back to the OR for debridement, still requiring low-dose Levophed, midodrine.  05/22/21- patient is more stable.  MAP is 50 during my evaluation but no vasopressor use today.  Patient is communicative he was able to tell stories for 2 hours today.  Plan to transfer to stepdown today.     Interim History / Subjective:  Taken back to the OR.  After OR no overt complaint.  Objective   Blood pressure (!) 131/97,  pulse 95, temperature (!) 97.4 F (36.3 C), temperature source Oral, resp. rate (!) 22, height 6\' 1"  (1.854 m), weight (!) 191 kg, SpO2 (!) 83 %.      Intake/Output Summary (Last 24 hours) at 05/22/2021 1015 Last data filed at 05/22/2021 1000 Gross per 24 hour  Intake 2888.67 ml  Output 3095 ml  Net -206.33 ml    Filed Weights   05/18/21 1600  Weight: (!) 191 kg    Examination: General: Obese, acutely ill appearing male, NAD resting in bed  HENT: large neck, no JVD present  Lungs: clear throughout, even, non labored  Cardiovascular: sinus rhythm with PVC's, no R/G, 2+ radial/1+ distal Abdomen: +BS x4, obese, soft, non tender, non distended Extremities: normal bulk and tone, no edema  Neuro: alert/oriented, follows commands, PERRL GU: scrotal surgical site with dressing intact; foley in place per urology   Labs/imaging that I havepersonally reviewed  (right click and "Reselect all SmartList Selections" daily)  06/9: Na+ 131, chloride 90, glucose 154, creatinine 1.42, albumin 2.6, lactic acid 2.5, pct 2.04, wbc 17.4, hgb 11.7, platelets 413 06/9: KUB revealed large stool burden without obstruction  06/9: CT Pelvis revealed large volume of gas interspersed within a large LEFT hydrocele. Findings consistent with a gas-forming bacteria infection of the LEFT hemiscrotum. Gas extends superiorly from the LEFT hemiscrotum through the LEFT inguinal canal to level the base of the penis. Additionally gas extends into the midline perineum to level of the base of the penis. Findings consistent with Fournier's gangrene. Recommend emergent urologic surgical consultation.  Resolved Hospital Problem list   N/A  Assessment & Plan:  Septic shock secondary to fournier's gangrene, necrotizing perineal infection s/p cystoscopy,  scrotal exploration and debridement~06/9 return to the OR 6/11 Severe sepsis with acute organ dysfunction Hx: HTN, HLD, Tachycardia, and TIA  Continuous telemetry monitoring   Continue gentle fluid resuscitation  Wean Levophed gtt to maintain map >65 Midodrine Hold outpatient antihypertensives and apixaban  Continue outpatient rosuvastatin  Trend WBC and monitor fever curve  Trend PCT  Continue zosyn and linezolid  Follow cultures  Urology consulted appreciate input   Hyponatremia  Acute renal failure secondary to hypotension  Lactic acidosis - resolved Hx: Kidney stone  Trend BMP and lactic acid  Monitor UOP Avoid nephrotoxic medications  Renal function stabilizing  COPD~stable  PRN bronchodilator therapy   Type II diabetes mellitus  CBG's q4hrs  SSI   Best practice (right click and "Reselect all SmartList Selections" daily)  Diet:  NPO due to OR, clear liquids otherwise Pain/Anxiety/Delirium protocol (if indicated): No VAP protocol (if indicated): Not indicated DVT prophylaxis: Contraindicated GI prophylaxis: PPI Glucose control:  SSI Yes Central venous access:  N/A Arterial line:  Yes, and it is still needed Foley:  N/A Mobility:  OOB  PT consulted: Yes Last date of multidisciplinary goals of care discussion [N/A] Code Status:  full code Disposition: ICU  Labs   CBC: Recent Labs  Lab 05/18/21 1607 05/18/21 2210 05/19/21 0226 05/19/21 0956 05/19/21 1616 05/19/21 2147 05/20/21 0551 05/21/21 0500 05/22/21 0411  WBC 14.0*   < > 22.0*   < > 17.3* 14.7* 13.4* 8.5 5.3  NEUTROABS 11.1*  --  19.5*  --   --   --   --   --   --   HGB 12.7*   < > 10.3*   < > 10.0* 9.4* 9.2* 8.8* 7.8*  HCT 37.9*   < > 31.0*   < > 30.5* 29.0* 28.6* 24.4* 24.7*  MCV 89.2   < > 95.7   < > 92.7 92.1 92.0 96.1 93.6  PLT 350   < > 350   < > 393 395 413* 412* 313   < > = values in this interval not displayed.     Basic Metabolic Panel: Recent Labs  Lab 05/18/21 2210 05/19/21 0226 05/19/21 0533 05/21/21 0500 05/22/21 0411  NA 131* 132* 133* 133* 132*  K 4.8 4.3 4.1 4.0 4.6  CL 90* 90* 91* 94* 96*  CO2 30 28 30  33* 31  GLUCOSE 154* 166* 158*  135* 144*  BUN 17 18 20 12 10   CREATININE 1.42* 1.80* 1.99* 1.37* 1.37*  CALCIUM 8.1* 8.0* 8.0* 7.8* 7.7*  MG  --  2.1  --  2.0 2.2  PHOS  --  5.1*  --  2.8 2.2*    GFR: Estimated Creatinine Clearance: 90.7 mL/min (A) (by C-G formula based on SCr of 1.37 mg/dL (H)). Recent Labs  Lab 05/18/21 1607 05/18/21 2210 05/19/21 0226 05/19/21 0533 05/19/21 0956 05/19/21 2147 05/20/21 0551 05/21/21 0500 05/22/21 0411  PROCALCITON  --  2.04  --   --   --   --   --   --   --   WBC 14.0* 17.4* 22.0*  --    < > 14.7* 13.4* 8.5 5.3  LATICACIDVEN 2.5*  --  3.9* 2.0*  --   --   --   --   --    < > = values in this interval not displayed.     Liver Function Tests: Recent Labs  Lab 05/18/21 2210 05/19/21 0226  AST 23 20  ALT 30 28  ALKPHOS 79  68  BILITOT 1.6* 1.7*  PROT 6.7 5.9*  ALBUMIN 2.6* 2.4*    No results for input(s): LIPASE, AMYLASE in the last 168 hours. No results for input(s): AMMONIA in the last 168 hours.  ABG    Component Value Date/Time   HCO3 25.4 07/02/2020 1136   O2SAT 91.0 07/02/2020 1136      Coagulation Profile: No results for input(s): INR, PROTIME in the last 168 hours.  Cardiac Enzymes: No results for input(s): CKTOTAL, CKMB, CKMBINDEX, TROPONINI in the last 168 hours.  HbA1C: Hemoglobin A1C  Date/Time Value Ref Range Status  06/11/2013 04:04 AM 6.7 (H) 4.2 - 6.3 % Final    Comment:    The American Diabetes Association recommends that a primary goal of therapy should be <7% and that physicians should reevaluate the treatment regimen in patients with HbA1c values consistently >8%.    Hgb A1c MFr Bld  Date/Time Value Ref Range Status  05/19/2021 05:33 AM 6.4 (H) 4.8 - 5.6 % Final    Comment:    (NOTE)         Prediabetes: 5.7 - 6.4         Diabetes: >6.4         Glycemic control for adults with diabetes: <7.0   05/27/2020 05:46 AM 6.4 (H) 4.8 - 5.6 % Final    Comment:    (NOTE)         Prediabetes: 5.7 - 6.4         Diabetes: >6.4          Glycemic control for adults with diabetes: <7.0     CBG: Recent Labs  Lab 05/21/21 1539 05/21/21 2003 05/21/21 2351 05/22/21 0402 05/22/21 0737  GLUCAP 206* 193* 183* 143* 132*     Review of Systems: Positives in BOLD   Gen: Denies fever, chills, weight change, fatigue, night sweats HEENT: Denies blurred vision, double vision, hearing loss, tinnitus, sinus congestion, rhinorrhea, sore throat, neck stiffness, dysphagia PULM: Denies shortness of breath, cough, sputum production, hemoptysis, wheezing CV: Denies chest pain, edema, orthopnea, paroxysmal nocturnal dyspnea, palpitations GI: abdominal pain, nausea, vomiting, diarrhea, hematochezia, melena, constipation, scrotal pain/swelling, change in bowel habits GU: Denies dysuria, hematuria, polyuria, oliguria, urethral discharge Endocrine: Denies hot or cold intolerance, polyuria, polyphagia or appetite change Derm: Denies rash, dry skin, scaling or peeling skin change Heme: Denies easy bruising, bleeding, bleeding gums Neuro: Denies headache, numbness, weakness, slurred speech, loss of memory or consciousness   Allergies Allergies  Allergen Reactions   Morphine Anxiety and Other (See Comments)    Hallucinations   Morphine And Related Anxiety     Home Medications  Prior to Admission medications   Medication Sig Start Date End Date Taking? Authorizing Provider  amoxicillin-clavulanate (AUGMENTIN) 875-125 MG tablet Take 1 tablet by mouth 2 (two) times daily for 4 days. 05/17/21 05/21/21  Tresa Moore, MD  apixaban (ELIQUIS) 5 MG TABS tablet Take 1 tablet (5 mg total) by mouth 2 (two) times daily. 08/16/20 02/12/21  Theotis Burrow, NP  famotidine (PEPCID) 20 MG tablet Take 1 tablet (20 mg total) by mouth 2 (two) times daily. 08/30/20   Carlean Jews, NP  furosemide (LASIX) 40 MG tablet Take 1 tablet (40 mg total) by mouth daily. 05/16/21   Tresa Moore, MD  Boris Lown Oil (OMEGA-3) 500 MG CAPS Take 1 tab po  daily Patient taking differently: Take 500 mg by mouth daily. 06/04/19   Carlean Jews, NP  lisinopril (ZESTRIL) 10  MG tablet TAKE 1 TABLET BY MOUTH ONCE DAILY 05/06/21   Lyndon Code, MD  metoprolol succinate (TOPROL-XL) 50 MG 24 hr tablet Take 1 tablet by mouth once daily 04/20/21   Lyndon Code, MD  nicotine (NICODERM CQ - DOSED IN MG/24 HOURS) 21 mg/24hr patch Place 1 patch (21 mg total) onto the skin daily. 08/01/20   Carlean Jews, NP  phenylephrine-shark liver oil-mineral oil-petrolatum (PREPARATION H) 0.25-14-74.9 % rectal ointment Place 1 application rectally 2 (two) times daily as needed for hemorrhoids. 05/16/21   Sreenath, Sudheer B, MD  polyethylene glycol (MIRALAX / GLYCOLAX) 17 g packet Take 17 g by mouth daily. 05/17/21   Tresa Moore, MD  rosuvastatin (CRESTOR) 20 MG tablet Take 1 tablet (20 mg total) by mouth daily. 05/16/20   Carlean Jews, NP  senna-docusate (SENOKOT-S) 8.6-50 MG tablet Take 1 tablet by mouth 2 (two) times daily. 05/16/21   Tresa Moore, MD  VENTOLIN HFA 108 (90 Base) MCG/ACT inhaler Inhale 2 puffs into the lungs every 6 (six) hours as needed for wheezing or shortness of breath. 08/09/20   Carlean Jews, NP     Current MAR reviewed with clinical pharmacist.  The patient is critically ill with multiple organ systems failure and requires high complexity decision making for assessment and support, frequent evaluation and titration of therapies, application of advanced monitoring technologies and extensive interpretation of multiple databases. Critical Care Time devoted to patient care services described in this note is 35 minutes.    Vida Rigger, M.D.  Pulmonary & Critical Care Medicine  [facility]   *This note was dictated using voice recognition software/Dragon.  Despite best efforts to proofread, errors can occur which can change the meaning.  Any change was purely unintentional.

## 2021-05-22 NOTE — Progress Notes (Signed)
Urology Inpatient Progress Note  Subjective: No acute events overnight.  He is afebrile, VSS. Creatinine stable, 1.37.  WBC count down today, 5.3.  Wound culture pending with multiple organisms present, none predominant.  Blood cultures pending with no growth at 4 days.  On antibiotics as below. Foley catheter in place.  Anti-infectives: Anti-infectives (From admission, onward)    Start     Dose/Rate Route Frequency Ordered Stop   05/20/21 1000  piperacillin-tazobactam (ZOSYN) IVPB 4.5 g        4.5 g 25 mL/hr over 240 Minutes Intravenous Every 8 hours 05/20/21 0820     05/19/21 0915  piperacillin-tazobactam (ZOSYN) 4.5 g in dextrose 5 % 100 mL IVPB  Status:  Discontinued        4.5 g 200 mL/hr over 30 Minutes Intravenous Every 8 hours 05/19/21 0646 05/19/21 0652   05/19/21 0915  piperacillin-tazobactam (ZOSYN) IVPB 4.5 g  Status:  Discontinued        4.5 g 200 mL/hr over 30 Minutes Intravenous Every 8 hours 05/19/21 0652 05/20/21 0820   05/19/21 0600  ampicillin (OMNIPEN) 2 g in sodium chloride 0.9 % 100 mL IVPB  Status:  Discontinued        2 g 300 mL/hr over 20 Minutes Intravenous Every 6 hours 05/18/21 2140 05/18/21 2150   05/19/21 0115  piperacillin-tazobactam (ZOSYN) IVPB 4.5 g  Status:  Discontinued        4.5 g 200 mL/hr over 30 Minutes Intravenous Every 8 hours 05/18/21 2150 05/19/21 0646   05/18/21 2200  linezolid (ZYVOX) IVPB 600 mg  Status:  Discontinued        600 mg 300 mL/hr over 60 Minutes Intravenous Every 12 hours 05/18/21 2151 05/22/21 0755   05/18/21 2000  ampicillin (OMNIPEN) 2 g in sodium chloride 0.9 % 100 mL IVPB        2 g 300 mL/hr over 20 Minutes Intravenous  Once 05/18/21 1951 05/18/21 2141   05/18/21 2000  gentamicin (GARAMYCIN) 250 mg in dextrose 5 % 50 mL IVPB        2 mg/kg  124.3 kg (Adjusted) 112.5 mL/hr over 30 Minutes Intravenous  Once 05/18/21 1951 05/18/21 2124       Current Facility-Administered Medications  Medication Dose Route  Frequency Provider Last Rate Last Admin   0.9 %  sodium chloride infusion   Intravenous Continuous Stoioff, Scott C, MD 75 mL/hr at 05/22/21 1000 Infusion Verify at 05/22/21 1000   0.9 %  sodium chloride infusion  250 mL Intravenous Continuous Stoioff, Scott C, MD       0.9 %  sodium chloride infusion  250 mL Intravenous Continuous Stoioff, Scott C, MD       acetaminophen (TYLENOL) tablet 650 mg  650 mg Oral Q6H PRN Stoioff, Scott C, MD       Or   acetaminophen (TYLENOL) suppository 650 mg  650 mg Rectal Q6H PRN Stoioff, Scott C, MD       albuterol (PROVENTIL) (2.5 MG/3ML) 0.083% nebulizer solution 2.5 mg  2.5 mg Nebulization Q2H PRN Stoioff, Scott C, MD       Chlorhexidine Gluconate Cloth 2 % PADS 6 each  6 each Topical Daily Stoioff, Scott C, MD   6 each at 05/20/21 1257   fentaNYL (SUBLIMAZE) injection 50-100 mcg  50-100 mcg Intravenous Q4H PRN Stoioff, Scott C, MD   100 mcg at 05/22/21 1158   insulin aspart (novoLOG) injection 0-9 Units  0-9 Units Subcutaneous Q4H Stoioff, Scott C,  MD   1 Units at 05/22/21 0816   ipratropium-albuterol (DUONEB) 0.5-2.5 (3) MG/3ML nebulizer solution 3 mL  3 mL Nebulization BID Stoioff, Scott C, MD   3 mL at 05/22/21 0806   midodrine (PROAMATINE) tablet 10 mg  10 mg Oral TID WC Stoioff, Scott C, MD   10 mg at 05/22/21 0816   nicotine (NICODERM CQ - dosed in mg/24 hours) patch 21 mg  21 mg Transdermal Daily Stoioff, Scott C, MD   21 mg at 05/21/21 1227   pantoprazole (PROTONIX) injection 40 mg  40 mg Intravenous QHS Stoioff, Scott C, MD   40 mg at 05/21/21 2143   piperacillin-tazobactam (ZOSYN) IVPB 4.5 g  4.5 g Intravenous Q8H Stoioff, Scott C, MD 25 mL/hr at 05/22/21 1000 Infusion Verify at 05/22/21 1000   polyethylene glycol (MIRALAX / GLYCOLAX) packet 17 g  17 g Oral Daily PRN Stoioff, Scott C, MD       rosuvastatin (CRESTOR) tablet 20 mg  20 mg Oral Daily Stoioff, Scott C, MD   20 mg at 05/21/21 1212   senna (SENOKOT) tablet 8.6 mg  1 tablet Oral BID Stoioff,  Scott C, MD   8.6 mg at 05/21/21 2144   sodium hypochlorite (DAKIN'S 1/2 STRENGTH) topical solution   Irrigation UD Stoioff, Scott C, MD       Objective: Vital signs in last 24 hours: Temp:  [97.4 F (36.3 C)-97.6 F (36.4 C)] 97.4 F (36.3 C) (06/12 2000) Pulse Rate:  [88-99] 95 (06/13 0900) Resp:  [13-22] 22 (06/13 1000) BP: (92-159)/(48-97) 131/97 (06/13 1000) SpO2:  [83 %-98 %] 83 % (06/13 0900)  Intake/Output from previous day: 06/12 0701 - 06/13 0700 In: 2054 [I.V.:1239.4; IV Piggyback:814.6] Out: 3030 [Urine:3030] Intake/Output this shift: Total I/O In: 1138 [P.O.:320; I.V.:683.5; IV Piggyback:134.6] Out: 280 [Urine:280]  Physical Exam Vitals and nursing note reviewed.  Constitutional:      General: He is not in acute distress.    Appearance: He is not ill-appearing, toxic-appearing or diaphoretic.  HENT:     Head: Normocephalic and atraumatic.  Pulmonary:     Effort: Pulmonary effort is normal. No respiratory distress.  Genitourinary:    Comments: Left scrotal wound packing removed, revealing healthy granulation tissue across the wound bed.  No evidence of active bleeding.  There is a small volume of fibrinous exudate over the lateral aspect of the wound, too small for debridement.  No purulence or necrotic tissue noted.  Left testicle appears viable. Skin:    General: Skin is warm and dry.  Neurological:     Mental Status: He is alert and oriented to person, place, and time.  Psychiatric:        Mood and Affect: Mood normal.        Behavior: Behavior normal.   Lab Results:  Recent Labs    05/21/21 0500 05/22/21 0411  WBC 8.5 5.3  HGB 8.8* 7.8*  HCT 24.4* 24.7*  PLT 412* 313   BMET Recent Labs    05/21/21 0500 05/22/21 0411  NA 133* 132*  K 4.0 4.6  CL 94* 96*  CO2 33* 31  GLUCOSE 135* 144*  BUN 12 10  CREATININE 1.37* 1.37*  CALCIUM 7.8* 7.7*   Assessment & Plan: 70 year old male admitted with Fournier's gangrene, on empiric antibiotics with  polymicrobial growth on wound culture.  Dr. Lonna Cobb and I completed his first wet-to-dry dressing change at the bedside today with saline and Kerlix.  Patient tolerated well with an appropriate amount  of discomfort despite premedication.  He will require daily wet-to-dry packing exchanges at the bedside, recommend premedication given the extent and depth of his wound.  Ultimately, he will likely require closure with plastics, however for now continue with daily dressing changes and empiric antibiotics.  Carman Ching, PA-C 05/22/2021

## 2021-05-22 NOTE — Progress Notes (Signed)
Patient ID: Thomas Tanner, male   DOB: [birth year], 70 y.o.   MRN: [id number]  Dr. Karna Christmas has rounded on patient today and would like [facility] to pickup pt in am instead.  Will take over on 6/14.

## 2021-05-22 NOTE — Progress Notes (Signed)
   Date of Admission:  05/18/2021     ID: Thomas Tanner is a 70 y.o. male  Active Problems:   Hypertension   CAD in native artery   Morbid obesity with BMI of 50.0-59.9, adult (HCC)   Gastroesophageal reflux disease without esophagitis   Chronic diastolic CHF (congestive heart failure) (HCC)   Hyperlipidemia   Constipated   Abscess   Fournier's gangrene of scrotum   Fournier gangrene   Sepsis (HCC)   Functional quadriplegia (HCC)    Subjective: Patient doing better Had dressing changed by urology at this morning. No fever  Medications:   Chlorhexidine Gluconate Cloth  6 each Topical Daily   insulin aspart  0-9 Units Subcutaneous Q4H   ipratropium-albuterol  3 mL Nebulization BID   midodrine  10 mg Oral TID WC   nicotine  21 mg Transdermal Daily   pantoprazole (PROTONIX) IV  40 mg Intravenous QHS   rosuvastatin  20 mg Oral Daily   senna  1 tablet Oral BID   sodium hypochlorite   Irrigation UD    Objective: Vital signs in last 24 hours: Temp:  [97 F (36.1 C)-97.9 F (36.6 C)] 97.4 F (36.3 C) (06/12 2000) Pulse Rate:  [90-110] 95 (06/12 1848) Resp:  [17-28] 22 (06/12 1848) BP: (92-159)/(44-70) 92/48 (06/12 1848) SpO2:  [89 %-98 %] 94 % (06/12 2054)  PHYSICAL EXAM:  General: Alert, cooperative, no distress, appears stated age.  Lungs: Clear to auscultation bilaterally. No Wheezing or Rhonchi. No rales. Heart: Regular rate and rhythm, no murmur, rub or gallop. Abdomen: Soft, non-tender,not distended. Bowel sounds normal. No masses Genitourinary area covered by dressing Extremities: atraumatic, no cyanosis. No edema. No clubbing Skin: No rashes or lesions. Or bruising Lymph: Cervical, supraclavicular normal. Neurologic: Grossly non-focal  Lab Results Recent Labs    05/21/21 0500 05/22/21 0411  WBC 8.5 5.3  HGB 8.8* 7.8*  HCT 24.4* 24.7*  NA 133* 132*  K 4.0 4.6  CL 94* 96*  CO2 33* 31  BUN 12 10  CREATININE 1.37* 1.37*     Microbiology: Abscess scrotum culture multiple organisms present.  No group B strep, no staff aureus, abundant Bacteroides ovatus present    Assessment/Plan:  Fournier's gangrene.  Status post I&D, debridement into 3. Currently on Zosyn and linezolid Leukocytosis is resolved Cultures have not grown any Staphylococcus or Pseudomonas.  Has anaerobes. Will DC linezolid. Will change Zosyn to Unasyn starting tomorrow.  Recent Bacteroides bacteremia.  On appropriate antibiotic. Patient will likely need 2 to 4 weeks of antibiotics.  After initial IV can be switched to p.o. once he is stable  Morbid obesity  I discussed the management with the care team.

## 2021-05-22 NOTE — Plan of Care (Signed)
Neuro: alert and oriented Resp: stable on 3L [place] to room air  CV: afebrile, vital signs fairly stable, some edema  GIGU: tolerating PO, advancing diet, foley in place, no BM-passing gas  Skin: dressing changed by urology Social: Son and Daughter came to visit today; All questions and concerns addressed  Events: Dressing change at the bedside by Urology  Problem: Education: Goal: Knowledge of General Education information will improve Description: Including pain rating scale, medication(s)/side effects and non-pharmacologic comfort measures Outcome: Progressing   Problem: Health Behavior/Discharge Planning: Goal: Ability to manage health-related needs will improve Outcome: Progressing   Problem: Clinical Measurements: Goal: Ability to maintain clinical measurements within normal limits will improve Outcome: Progressing Goal: Will remain free from infection Outcome: Progressing Goal: Diagnostic test results will improve Outcome: Progressing Goal: Respiratory complications will improve Outcome: Progressing Goal: Cardiovascular complication will be avoided Outcome: Progressing   Problem: Activity: Goal: Risk for activity intolerance will decrease Outcome: Progressing   Problem: Nutrition: Goal: Adequate nutrition will be maintained Outcome: Progressing   Problem: Coping: Goal: Level of anxiety will decrease Outcome: Progressing   Problem: Elimination: Goal: Will not experience complications related to bowel motility Outcome: Progressing Goal: Will not experience complications related to urinary retention Outcome: Progressing   Problem: Pain Managment: Goal: General experience of comfort will improve Outcome: Progressing   Problem: Safety: Goal: Ability to remain free from injury will improve Outcome: Progressing   Problem: Skin Integrity: Goal: Risk for impaired skin integrity will decrease Outcome: Progressing

## 2021-05-23 DIAGNOSIS — I5032 Chronic diastolic (congestive) heart failure: Secondary | ICD-10-CM | POA: Diagnosis not present

## 2021-05-23 DIAGNOSIS — N493 Fournier gangrene: Secondary | ICD-10-CM | POA: Diagnosis not present

## 2021-05-23 DIAGNOSIS — L0291 Cutaneous abscess, unspecified: Secondary | ICD-10-CM | POA: Diagnosis not present

## 2021-05-23 DIAGNOSIS — I2510 Atherosclerotic heart disease of native coronary artery without angina pectoris: Secondary | ICD-10-CM | POA: Diagnosis not present

## 2021-05-23 LAB — BASIC METABOLIC PANEL
Anion gap: 5 (ref 5–15)
BUN: 10 mg/dL (ref 8–23)
CO2: 33 mmol/L — ABNORMAL HIGH (ref 22–32)
Calcium: 8.1 mg/dL — ABNORMAL LOW (ref 8.9–10.3)
Chloride: 98 mmol/L (ref 98–111)
Creatinine, Ser: 1.24 mg/dL (ref 0.61–1.24)
GFR, Estimated: >60 mL/min (ref >60)
Glucose, Bld: 90 mg/dL (ref 70–99)
Potassium: 4.1 mmol/L (ref 3.5–5.1)
Sodium: 136 mmol/L (ref 135–145)

## 2021-05-23 LAB — AEROBIC/ANAEROBIC CULTURE W GRAM STAIN (SURGICAL/DEEP WOUND)

## 2021-05-23 LAB — GLUCOSE, CAPILLARY
Glucose-Capillary: 90 mg/dL (ref 70–99)
Glucose-Capillary: 86 mg/dL (ref 70–99)
Glucose-Capillary: 94 mg/dL (ref 70–99)
Glucose-Capillary: 112 mg/dL — ABNORMAL HIGH (ref 70–99)
Glucose-Capillary: 130 mg/dL — ABNORMAL HIGH (ref 70–99)
Glucose-Capillary: 113 mg/dL — ABNORMAL HIGH (ref 70–99)

## 2021-05-23 LAB — CULTURE, BLOOD (ROUTINE X 2)
Culture: NO GROWTH
Culture: NO GROWTH
Special Requests: ADEQUATE

## 2021-05-23 LAB — PHOSPHORUS: Phosphorus: 3.2 mg/dL (ref 2.5–4.6)

## 2021-05-23 LAB — MAGNESIUM: Magnesium: 2.2 mg/dL (ref 1.7–2.4)

## 2021-05-23 MED ORDER — PANTOPRAZOLE SODIUM 40 MG PO TBEC
40.0000 mg | DELAYED_RELEASE_TABLET | Freq: Every day | ORAL | Status: DC
  Administered 2021-05-23 – 2021-05-26 (×4): 40 mg via ORAL
  Filled 2021-05-23 (×4): qty 1

## 2021-05-23 NOTE — Progress Notes (Addendum)
Urology Inpatient Progress Note  Subjective: No acute events overnight.  He is afebrile, VSS. Abscess cultures pending with polymicrobial growth including abundant Bacteroides ovatus.  On antibiotics as below. Foley catheter in place draining clear, yellow urine. Today he reports intermittent groin pain with no acute concerns.  Anti-infectives: Anti-infectives (From admission, onward)    Start     Dose/Rate Route Frequency Ordered Stop   05/23/21 0200  Ampicillin-Sulbactam (UNASYN) 3 g in sodium chloride 0.9 % 100 mL IVPB        3 g 200 mL/hr over 30 Minutes Intravenous Every 6 hours 05/22/21 1711     05/20/21 1000  piperacillin-tazobactam (ZOSYN) IVPB 4.5 g        4.5 g 25 mL/hr over 240 Minutes Intravenous Every 8 hours 05/20/21 0820 05/22/21 1938   05/19/21 0915  piperacillin-tazobactam (ZOSYN) 4.5 g in dextrose 5 % 100 mL IVPB  Status:  Discontinued        4.5 g 200 mL/hr over 30 Minutes Intravenous Every 8 hours 05/19/21 0646 05/19/21 0652   05/19/21 0915  piperacillin-tazobactam (ZOSYN) IVPB 4.5 g  Status:  Discontinued        4.5 g 200 mL/hr over 30 Minutes Intravenous Every 8 hours 05/19/21 0652 05/20/21 0820   05/19/21 0600  ampicillin (OMNIPEN) 2 g in sodium chloride 0.9 % 100 mL IVPB  Status:  Discontinued        2 g 300 mL/hr over 20 Minutes Intravenous Every 6 hours 05/18/21 2140 05/18/21 2150   05/19/21 0115  piperacillin-tazobactam (ZOSYN) IVPB 4.5 g  Status:  Discontinued        4.5 g 200 mL/hr over 30 Minutes Intravenous Every 8 hours 05/18/21 2150 05/19/21 0646   05/18/21 2200  linezolid (ZYVOX) IVPB 600 mg  Status:  Discontinued        600 mg 300 mL/hr over 60 Minutes Intravenous Every 12 hours 05/18/21 2151 05/22/21 0755   05/18/21 2000  ampicillin (OMNIPEN) 2 g in sodium chloride 0.9 % 100 mL IVPB        2 g 300 mL/hr over 20 Minutes Intravenous  Once 05/18/21 1951 05/18/21 2141   05/18/21 2000  gentamicin (GARAMYCIN) 250 mg in dextrose 5 % 50 mL IVPB         2 mg/kg  124.3 kg (Adjusted) 112.5 mL/hr over 30 Minutes Intravenous  Once 05/18/21 1951 05/18/21 2124       Current Facility-Administered Medications  Medication Dose Route Frequency Provider Last Rate Last Admin   0.9 %  sodium chloride infusion   Intravenous Continuous Stoioff, Scott C, MD 75 mL/hr at 05/23/21 0600 Infusion Verify at 05/23/21 0600   0.9 %  sodium chloride infusion  250 mL Intravenous Continuous Stoioff, Scott C, MD       0.9 %  sodium chloride infusion  250 mL Intravenous Continuous Stoioff, Scott C, MD       acetaminophen (TYLENOL) tablet 650 mg  650 mg Oral Q6H PRN Stoioff, Scott C, MD   650 mg at 05/22/21 2135   Or   acetaminophen (TYLENOL) suppository 650 mg  650 mg Rectal Q6H PRN Stoioff, Scott C, MD       albuterol (PROVENTIL) (2.5 MG/3ML) 0.083% nebulizer solution 2.5 mg  2.5 mg Nebulization Q2H PRN Stoioff, Scott C, MD       Ampicillin-Sulbactam (UNASYN) 3 g in sodium chloride 0.9 % 100 mL IVPB  3 g Intravenous Q6H Ravishankar, Jayashree, MD 200 mL/hr at 05/23/21 0600 3 g  at 05/23/21 0600   Chlorhexidine Gluconate Cloth 2 % PADS 6 each  6 each Topical Daily Riki Altes, MD   6 each at 05/20/21 1257   fentaNYL (SUBLIMAZE) injection 50-100 mcg  50-100 mcg Intravenous Q4H PRN Stoioff, Scott C, MD   100 mcg at 05/23/21 0108   insulin aspart (novoLOG) injection 0-9 Units  0-9 Units Subcutaneous Q4H Stoioff, Scott C, MD   1 Units at 05/23/21 0107   ipratropium-albuterol (DUONEB) 0.5-2.5 (3) MG/3ML nebulizer solution 3 mL  3 mL Nebulization BID Stoioff, Scott C, MD   3 mL at 05/22/21 1949   midodrine (PROAMATINE) tablet 10 mg  10 mg Oral TID WC Stoioff, Scott C, MD   10 mg at 05/22/21 1738   nicotine (NICODERM CQ - dosed in mg/24 hours) patch 21 mg  21 mg Transdermal Daily Stoioff, Scott C, MD   21 mg at 05/22/21 1301   pantoprazole (PROTONIX) injection 40 mg  40 mg Intravenous QHS Stoioff, Scott C, MD   40 mg at 05/22/21 2133   polyethylene glycol (MIRALAX /  GLYCOLAX) packet 17 g  17 g Oral Daily PRN Stoioff, Scott C, MD       rosuvastatin (CRESTOR) tablet 20 mg  20 mg Oral Daily Stoioff, Scott C, MD   20 mg at 05/22/21 1259   senna (SENOKOT) tablet 8.6 mg  1 tablet Oral BID Stoioff, Scott C, MD   8.6 mg at 05/22/21 2133   sodium hypochlorite (DAKIN'S 1/2 STRENGTH) topical solution   Irrigation UD Stoioff, Scott C, MD       Objective: Vital signs in last 24 hours: Temp:  [97.6 F (36.4 C)-98 F (36.7 C)] 97.6 F (36.4 C) (06/14 0108) Pulse Rate:  [75-95] 75 (06/14 0000) Resp:  [15-22] 17 (06/14 0600) BP: (80-148)/(44-97) 148/67 (06/14 0600) SpO2:  [83 %-98 %] 98 % (06/14 0000)  Intake/Output from previous day: 06/13 0701 - 06/14 0700 In: 3138.6 [P.O.:560; I.V.:2187.3; IV Piggyback:391.3] Out: 1675 [Urine:1675] Intake/Output this shift: No intake/output data recorded.  Physical Exam Vitals and nursing note reviewed.  Constitutional:      General: He is not in acute distress.    Appearance: He is not ill-appearing, toxic-appearing or diaphoretic.  HENT:     Head: Normocephalic and atraumatic.  Pulmonary:     Effort: Pulmonary effort is normal. No respiratory distress.  Genitourinary:    Comments: Wet-to-dry dressing change performed at the bedside today.  Wound bed with healthy appearing granulation tissue throughout the base, with a small thread of fibrinous exudate at the left side of the wound, trimmed at the bedside without bleeding.  There remains a small, approximate 2 cm area of superficial gray eschar on the wound bed lateral to the testicle.  No evidence of active bleeding.  Testicle appears viable. Skin:    General: Skin is warm and dry.  Neurological:     Mental Status: He is alert and oriented to person, place, and time.  Psychiatric:        Mood and Affect: Mood normal.        Behavior: Behavior normal.   Lab Results:  Recent Labs    05/21/21 0500 05/22/21 0411  WBC 8.5 5.3  HGB 8.8* 7.8*  HCT 24.4* 24.7*  PLT  412* 313   BMET Recent Labs    05/22/21 0411 05/23/21 0601  NA 132* 136  K 4.6 4.1  CL 96* 98  CO2 31 33*  GLUCOSE 144* 90  BUN 10  10  CREATININE 1.37* 1.24  CALCIUM 7.7* 8.1*   Assessment & Plan: 70 year old male admitted with Fournier's gangrene, on empiric antibiotics with polymicrobial growth on wound culture.  Michiel Cowboy and I completed his daily wet-to-dry dressing change at the bedside today.  Patient tolerated well.  We explained that dressing changes will become less painful over time as his wound heals.  We will continue with wet-to-dry dressing changes daily, premedication necessary. No evidence of advancing necrosis today warranting further debridement.  Continue IV antibiotics.  Carman Ching, PA-C 05/23/2021

## 2021-05-23 NOTE — Plan of Care (Signed)

## 2021-05-23 NOTE — Progress Notes (Signed)
NAME:  Thomas Tanner, MRN:  [id number], DOB:  February 03, 1951, LOS: 5 ADMISSION DATE:  05/18/2021, CONSULTATION DATE: 05/19/2021 REFERRING MD: Dr. Arville Care, CHIEF COMPLAINT: Hypotension   History of Present Illness:  This is a 70 yo male who presented to [facility] ER on 06/9 with c/o constipation, abdominal pain, and scrotal pain.  However, scrotal ultrasound and CT Pelvis revealed extensive gas in the scrotum consistent with Fournier's Gangrene, and also noted upon examination to have extensive purulent drainage from the midline of the scrotum.  ER physician consulted Urology and pt transported emergently to the OR.  He required cystoscopy; urethral dilation of urethral stricture; complex catheter placement; scrotal exploration and debridement of skin/subcutaneous tissue/muscle/ and fascia.  He was subsequently admitted to the stepdown unit per hospitalist team for additional workup and treatment.  On 06/10 he developed hypotension requiring peripheral levophed gtt PCCM team consulted to assist with management.   Pertinent  Medical History  Arthritis CHF COPD Type II Diabetes Mellitus HLD HTN Kidney Stones Obesity  Tachycardia  TIA   Significant Hospital Events: Including procedures, antibiotic start and stop dates in addition to other pertinent events   06/9: Pt admitted with Fournier's Gangrene, necrotizing perineal infection s/p cystoscopy, scrotal exploration and debridement  06/10: Pt developed hypotension requiring levophed gtt PCCM consulted to assist with management  06/11: Back to the OR for debridement, still requiring low-dose Levophed, midodrine.  05/22/21- patient is more stable.  MAP is 50 during my evaluation but no vasopressor use today.  Patient is communicative he was able to tell stories for 2 hours today.  Plan to transfer to stepdown today.  05/23/21- patient is optimized for downgrade to PCU. Have reviewed care plan with Dr Marylu Lund today.     Interim History /  Subjective:  Taken back to the OR.  After OR no overt complaint.  Objective   Blood pressure 95/65, pulse 97, temperature 97.6 F (36.4 C), resp. rate 17, height 6\' 1"  (1.854 m), weight (!) 191 kg, SpO2 98 %.      Intake/Output Summary (Last 24 hours) at 05/23/2021 0937 Last data filed at 05/23/2021 0900 Gross per 24 hour  Intake 2435.72 ml  Output 1395 ml  Net 1040.72 ml    Filed Weights   05/18/21 1600  Weight: (!) 191 kg    Examination: General: Obese, acutely ill appearing male, NAD resting in bed  HENT: large neck, no JVD present  Lungs: clear throughout, even, non labored  Cardiovascular: sinus rhythm with PVC's, no R/G, 2+ radial/1+ distal Abdomen: +BS x4, obese, soft, non tender, non distended Extremities: normal bulk and tone, no edema  Neuro: alert/oriented, follows commands, PERRL GU: scrotal surgical site with dressing intact;    Assessment & Plan:  Septic shock secondary to fournier's gangrene, necrotizing perineal infection s/p cystoscopy, scrotal exploration and debridement~06/9 return to the OR 6/11 Severe sepsis with acute organ dysfunction Hx: HTN, HLD, Tachycardia, and TIA  Continuous telemetry monitoring  Continue gentle fluid resuscitation  Wean Levophed gtt to maintain map >65 Midodrine Hold outpatient antihypertensives and apixaban  Continue outpatient rosuvastatin  Trend WBC and monitor fever curve  Trend PCT  Continue zosyn and linezolid  Follow cultures  Urology consulted appreciate input   Hyponatremia  Acute renal failure secondary to hypotension  Lactic acidosis - resolved Hx: Kidney stone  Trend BMP and lactic acid  Monitor UOP Avoid nephrotoxic medications  Renal function stabilizing  COPD~stable  PRN bronchodilator therapy   Type II diabetes mellitus  CBG's q4hrs  SSI   Best practice (right click and "Reselect all SmartList Selections" daily)  Diet:  NPO due to OR, clear liquids otherwise Pain/Anxiety/Delirium protocol  (if indicated): No VAP protocol (if indicated): Not indicated DVT prophylaxis: Contraindicated GI prophylaxis: PPI Glucose control:  SSI Yes Central venous access:  N/A Arterial line:  Yes, and it is still needed Foley:  N/A Mobility:  OOB  PT consulted: Yes Last date of multidisciplinary goals of care discussion [N/A] Code Status:  full code Disposition: ICU  Labs   CBC: Recent Labs  Lab 05/18/21 1607 05/18/21 2210 05/19/21 0226 05/19/21 0956 05/19/21 1616 05/19/21 2147 05/20/21 0551 05/21/21 0500 05/22/21 0411  WBC 14.0*   < > 22.0*   < > 17.3* 14.7* 13.4* 8.5 5.3  NEUTROABS 11.1*  --  19.5*  --   --   --   --   --   --   HGB 12.7*   < > 10.3*   < > 10.0* 9.4* 9.2* 8.8* 7.8*  HCT 37.9*   < > 31.0*   < > 30.5* 29.0* 28.6* 24.4* 24.7*  MCV 89.2   < > 95.7   < > 92.7 92.1 92.0 96.1 93.6  PLT 350   < > 350   < > 393 395 413* 412* 313   < > = values in this interval not displayed.     Basic Metabolic Panel: Recent Labs  Lab 05/19/21 0226 05/19/21 0533 05/21/21 0500 05/22/21 0411 05/23/21 0601  NA 132* 133* 133* 132* 136  K 4.3 4.1 4.0 4.6 4.1  CL 90* 91* 94* 96* 98  CO2 28 30 33* 31 33*  GLUCOSE 166* 158* 135* 144* 90  BUN 18 20 12 10 10   CREATININE 1.80* 1.99* 1.37* 1.37* 1.24  CALCIUM 8.0* 8.0* 7.8* 7.7* 8.1*  MG 2.1  --  2.0 2.2 2.2  PHOS 5.1*  --  2.8 2.2* 3.2    GFR: Estimated Creatinine Clearance: 100.2 mL/min (by C-G formula based on SCr of 1.24 mg/dL). Recent Labs  Lab 05/18/21 1607 05/18/21 2210 05/19/21 0226 05/19/21 0533 05/19/21 0956 05/19/21 2147 05/20/21 0551 05/21/21 0500 05/22/21 0411  PROCALCITON  --  2.04  --   --   --   --   --   --   --   WBC 14.0* 17.4* 22.0*  --    < > 14.7* 13.4* 8.5 5.3  LATICACIDVEN 2.5*  --  3.9* 2.0*  --   --   --   --   --    < > = values in this interval not displayed.     Liver Function Tests: Recent Labs  Lab 05/18/21 2210 05/19/21 0226  AST 23 20  ALT 30 28  ALKPHOS 79 68  BILITOT 1.6*  1.7*  PROT 6.7 5.9*  ALBUMIN 2.6* 2.4*    No results for input(s): LIPASE, AMYLASE in the last 168 hours. No results for input(s): AMMONIA in the last 168 hours.  ABG    Component Value Date/Time   HCO3 25.4 07/02/2020 1136   O2SAT 91.0 07/02/2020 1136      Coagulation Profile: No results for input(s): INR, PROTIME in the last 168 hours.  Cardiac Enzymes: No results for input(s): CKTOTAL, CKMB, CKMBINDEX, TROPONINI in the last 168 hours.  HbA1C: Hemoglobin A1C  Date/Time Value Ref Range Status  06/11/2013 04:04 AM 6.7 (H) 4.2 - 6.3 % Final    Comment:    The American Diabetes Association recommends  that a primary goal of therapy should be <7% and that physicians should reevaluate the treatment regimen in patients with HbA1c values consistently >8%.    Hgb A1c MFr Bld  Date/Time Value Ref Range Status  05/19/2021 05:33 AM 6.4 (H) 4.8 - 5.6 % Final    Comment:    (NOTE)         Prediabetes: 5.7 - 6.4         Diabetes: >6.4         Glycemic control for adults with diabetes: <7.0   05/27/2020 05:46 AM 6.4 (H) 4.8 - 5.6 % Final    Comment:    (NOTE)         Prediabetes: 5.7 - 6.4         Diabetes: >6.4         Glycemic control for adults with diabetes: <7.0     CBG: Recent Labs  Lab 05/22/21 1541 05/22/21 1948 05/22/21 2340 05/23/21 0415 05/23/21 0740  GLUCAP 125* 115* 121* 90 86     Review of Systems: Positives in BOLD   Gen: Denies fever, chills, weight change, fatigue, night sweats HEENT: Denies blurred vision, double vision, hearing loss, tinnitus, sinus congestion, rhinorrhea, sore throat, neck stiffness, dysphagia PULM: Denies shortness of breath, cough, sputum production, hemoptysis, wheezing CV: Denies chest pain, edema, orthopnea, paroxysmal nocturnal dyspnea, palpitations GI: abdominal pain, nausea, vomiting, diarrhea, hematochezia, melena, constipation, scrotal pain/swelling, change in bowel habits GU: Denies dysuria, hematuria, polyuria,  oliguria, urethral discharge Endocrine: Denies hot or cold intolerance, polyuria, polyphagia or appetite change Derm: Denies rash, dry skin, scaling or peeling skin change Heme: Denies easy bruising, bleeding, bleeding gums Neuro: Denies headache, numbness, weakness, slurred speech, loss of memory or consciousness   Allergies Allergies  Allergen Reactions   Morphine Anxiety and Other (See Comments)    Hallucinations   Morphine And Related Anxiety     Home Medications  Prior to Admission medications   Medication Sig Start Date End Date Taking? Authorizing Provider  amoxicillin-clavulanate (AUGMENTIN) 875-125 MG tablet Take 1 tablet by mouth 2 (two) times daily for 4 days. 05/17/21 05/21/21  Tresa Moore, MD  apixaban (ELIQUIS) 5 MG TABS tablet Take 1 tablet (5 mg total) by mouth 2 (two) times daily. 08/16/20 02/12/21  Theotis Burrow, NP  famotidine (PEPCID) 20 MG tablet Take 1 tablet (20 mg total) by mouth 2 (two) times daily. 08/30/20   Carlean Jews, NP  furosemide (LASIX) 40 MG tablet Take 1 tablet (40 mg total) by mouth daily. 05/16/21   Tresa Moore, MD  Boris Lown Oil (OMEGA-3) 500 MG CAPS Take 1 tab po daily Patient taking differently: Take 500 mg by mouth daily. 06/04/19   Carlean Jews, NP  lisinopril (ZESTRIL) 10 MG tablet TAKE 1 TABLET BY MOUTH ONCE DAILY 05/06/21   Lyndon Code, MD  metoprolol succinate (TOPROL-XL) 50 MG 24 hr tablet Take 1 tablet by mouth once daily 04/20/21   Lyndon Code, MD  nicotine (NICODERM CQ - DOSED IN MG/24 HOURS) 21 mg/24hr patch Place 1 patch (21 mg total) onto the skin daily. 08/01/20   Carlean Jews, NP  phenylephrine-shark liver oil-mineral oil-petrolatum (PREPARATION H) 0.25-14-74.9 % rectal ointment Place 1 application rectally 2 (two) times daily as needed for hemorrhoids. 05/16/21   Sreenath, Sudheer B, MD  polyethylene glycol (MIRALAX / GLYCOLAX) 17 g packet Take 17 g by mouth daily. 05/17/21   Tresa Moore, MD  rosuvastatin  (CRESTOR) 20  MG tablet Take 1 tablet (20 mg total) by mouth daily. 05/16/20   Carlean Jews, NP  senna-docusate (SENOKOT-S) 8.6-50 MG tablet Take 1 tablet by mouth 2 (two) times daily. 05/16/21   Tresa Moore, MD  VENTOLIN HFA 108 (90 Base) MCG/ACT inhaler Inhale 2 puffs into the lungs every 6 (six) hours as needed for wheezing or shortness of breath. 08/09/20   Carlean Jews, NP     Current MAR reviewed with clinical pharmacist.  The patient is critically ill with multiple organ systems failure and requires high complexity decision making for assessment and support, frequent evaluation and titration of therapies, application of advanced monitoring technologies and extensive interpretation of multiple databases. Critical Care Time devoted to patient care services described in this note is 34 minutes.    Vida Rigger, M.D.  Pulmonary & Critical Care Medicine  [facility]   *This note was dictated using voice recognition software/Dragon.  Despite best efforts to proofread, errors can occur which can change the meaning.  Any change was purely unintentional.

## 2021-05-23 NOTE — Progress Notes (Signed)
PROGRESS NOTE    Thomas Tanner  WUJ:[id number] DOB: 1951/02/17 DOA: 05/18/2021 PCP: Theotis Burrow, NP (Inactive)    Brief Narrative:  This is a 70 yo male who presented to [facility] ER on 06/9 with c/o constipation, abdominal pain, and scrotal pain.  However, scrotal ultrasound and CT Pelvis revealed extensive gas in the scrotum consistent with Fournier's Gangrene, and also noted upon examination to have extensive purulent drainage from the midline of the scrotum.  ER physician consulted Urology and pt transported emergently to the OR.  He required cystoscopy; urethral dilation of urethral stricture; complex catheter placement; scrotal exploration and debridement of skin/subcutaneous tissue/muscle/ and fascia.  He was subsequently admitted to the stepdown unit per hospitalist team for additional workup and treatment.  On 06/10 he developed hypotension requiring peripheral levophed gtt PCCM team consulted to assist with management.  06/9: Pt admitted with Fournier's Gangrene, necrotizing perineal infection s/p cystoscopy, scrotal exploration and debridement 06/10: Pt developed hypotension requiring levophed gtt PCCM consulted to assist with management 06/11: Back to the OR for debridement, still requiring low-dose Levophed, midodrine.   05/22/21- patient is more stable.  MAP is 50 during my evaluation but no vasopressor use today.  Patient is communicative he was able to tell stories for 2 hours today.  Plan to transfer to stepdown today. 05/23/21- patient is optimized for downgrade to PCU. TRH Pickup       Consultants:  urology  Procedures:   Antimicrobials:  unasyn   Subjective: Has no sob, cp, abd pain. Some groin pain at times.  Objective: Vitals:   05/23/21 0300 05/23/21 0400 05/23/21 0500 05/23/21 0600  BP: (!) 122/58 125/61 (!) 144/64 (!) 148/67  Pulse:      Resp: 17 17 16 17   Temp:      TempSrc:      SpO2:      Weight:      Height:        Intake/Output  Summary (Last 24 hours) at 05/23/2021 0814 Last data filed at 05/23/2021 0600 Gross per 24 hour  Intake 2515.72 ml  Output 1395 ml  Net 1120.72 ml   Filed Weights   05/18/21 1600  Weight: (!) 191 kg    Examination:  General exam: Appears calm and comfortable  Respiratory system: Clear to auscultation. Respiratory effort normal. Cardiovascular system: S1 & S2 heard, RRR.no gallop Gastrointestinal system: Abdomen is nondistended, soft and nontender. Obese Normal bowel sounds heard. Central nervous system: Alert and oriented.  Extremities: mild edema.  Skin: warm, dry Psychiatry:  Mood & affect appropriate.     Data Reviewed: I have personally reviewed following labs and imaging studies  CBC: Recent Labs  Lab 05/18/21 1607 05/18/21 2210 05/19/21 0226 05/19/21 0956 05/19/21 1616 05/19/21 2147 05/20/21 0551 05/21/21 0500 05/22/21 0411  WBC 14.0*   < > 22.0*   < > 17.3* 14.7* 13.4* 8.5 5.3  NEUTROABS 11.1*  --  19.5*  --   --   --   --   --   --   HGB 12.7*   < > 10.3*   < > 10.0* 9.4* 9.2* 8.8* 7.8*  HCT 37.9*   < > 31.0*   < > 30.5* 29.0* 28.6* 24.4* 24.7*  MCV 89.2   < > 95.7   < > 92.7 92.1 92.0 96.1 93.6  PLT 350   < > 350   < > 393 395 413* 412* 313   < > = values in this interval not displayed.  Basic Metabolic Panel: Recent Labs  Lab 05/19/21 0226 05/19/21 0533 05/21/21 0500 05/22/21 0411 05/23/21 0601  NA 132* 133* 133* 132* 136  K 4.3 4.1 4.0 4.6 4.1  CL 90* 91* 94* 96* 98  CO2 28 30 33* 31 33*  GLUCOSE 166* 158* 135* 144* 90  BUN CREATININE 1.80* 1.99* 1.37* 1.37* 1.24  CALCIUM 8.0* 8.0* 7.8* 7.7* 8.1*  MG 2.1  --  2.0 2.2 2.2  PHOS 5.1*  --  2.8 2.2* 3.2   GFR: Estimated Creatinine Clearance: 100.2 mL/min (by C-G formula based on SCr of 1.24 mg/dL). Liver Function Tests: Recent Labs  Lab 05/18/21 2210 05/19/21 0226  AST 23 20  ALT 30 28  ALKPHOS 79 68  BILITOT 1.6* 1.7*  PROT 6.7 5.9*  ALBUMIN 2.6* 2.4*   No results  for input(s): LIPASE, AMYLASE in the last 168 hours. No results for input(s): AMMONIA in the last 168 hours. Coagulation Profile: No results for input(s): INR, PROTIME in the last 168 hours. Cardiac Enzymes: No results for input(s): CKTOTAL, CKMB, CKMBINDEX, TROPONINI in the last 168 hours. BNP (last 3 results) No results for input(s): PROBNP in the last 8760 hours. HbA1C: No results for input(s): HGBA1C in the last 72 hours. CBG: Recent Labs  Lab 05/22/21 1541 05/22/21 1948 05/22/21 2340 05/23/21 0415 05/23/21 0740  GLUCAP 125* 115* 121* 90 86   Lipid Profile: No results for input(s): CHOL, HDL, LDLCALC, TRIG, CHOLHDL, LDLDIRECT in the last 72 hours. Thyroid Function Tests: No results for input(s): TSH, T4TOTAL, FREET4, T3FREE, THYROIDAB in the last 72 hours. Anemia Panel: No results for input(s): VITAMINB12, FOLATE, FERRITIN, TIBC, IRON, RETICCTPCT in the last 72 hours. Sepsis Labs: Recent Labs  Lab 05/18/21 1607 05/18/21 2210 05/19/21 0226 05/19/21 0533  PROCALCITON  --  2.04  --   --   LATICACIDVEN 2.5*  --  3.9* 2.0*    Recent Results (from the past 240 hour(s))  SARS CORONAVIRUS 2 (TAT 6-24 HRS) Nasopharyngeal Nasopharyngeal Swab     Status: None   Collection Time: 05/14/21  5:42 PM   Specimen: Nasopharyngeal Swab  Result Value Ref Range Status   SARS Coronavirus 2 NEGATIVE NEGATIVE Final    Comment: (NOTE) SARS-CoV-2 target nucleic acids are NOT DETECTED.  The SARS-CoV-2 RNA is generally detectable in upper and lower respiratory specimens during the acute phase of infection. Negative results do not preclude SARS-CoV-2 infection, do not rule out co-infections with other pathogens, and should not be used as the sole basis for treatment or other patient management decisions. Negative results must be combined with clinical observations, patient history, and epidemiological information. The expected result is Negative.  Fact Sheet for  Patients: HairSlick.no  Fact Sheet for Healthcare Providers: quierodirigir.com  This test is not yet approved or cleared by the [place] FDA and  has been authorized for detection and/or diagnosis of SARS-CoV-2 by FDA under an Emergency Use Authorization (EUA). This EUA will remain  in effect (meaning this test can be used) for the duration of the COVID-19 declaration under Se ction 564(b)(1) of the Act, 21 U.S.C. section 360bbb-3(b)(1), unless the authorization is terminated or revoked sooner.  Performed at [facility], [address] [place], [place] [address]   Culture, blood (routine x 2)     Status: None   Collection Time: 05/18/21  8:23 PM   Specimen: BLOOD  Result Value Ref Range Status   Specimen Description BLOOD BLOOD LEFT  FOREARM  Final   Special Requests   Final    BOTTLES DRAWN AEROBIC AND ANAEROBIC Blood Culture results may not be optimal due to an inadequate volume of blood received in culture bottles   Culture   Final    NO GROWTH 5 DAYS Performed at [facility], [address] [place], [place] [address]    Report Status 05/23/2021 FINAL  Final  Culture, blood (routine x 2)     Status: None   Collection Time: 05/18/21  8:23 PM   Specimen: BLOOD  Result Value Ref Range Status   Specimen Description BLOOD BLOOD RIGHT HAND  Final   Special Requests   Final    BOTTLES DRAWN AEROBIC AND ANAEROBIC Blood Culture adequate volume   Culture   Final    NO GROWTH 5 DAYS Performed at [facility], [address] [place], [place] [address]    Report Status 05/23/2021 FINAL  Final  Resp Panel by RT-PCR (Flu A&B, Covid) Nasopharyngeal Swab     Status: None   Collection Time: 05/18/21  8:23 PM   Specimen: Nasopharyngeal Swab; Nasopharyngeal(NP) swabs in vial transport medium  Result Value Ref Range Status   SARS Coronavirus 2 by RT PCR NEGATIVE NEGATIVE Final    Comment:  (NOTE) SARS-CoV-2 target nucleic acids are NOT DETECTED.  The SARS-CoV-2 RNA is generally detectable in upper respiratory specimens during the acute phase of infection. The lowest concentration of SARS-CoV-2 viral copies this assay can detect is 138 copies/mL. A negative result does not preclude SARS-Cov-2 infection and should not be used as the sole basis for treatment or other patient management decisions. A negative result may occur with  improper specimen collection/handling, submission of specimen other than nasopharyngeal swab, presence of viral mutation(s) within the areas targeted by this assay, and inadequate number of viral copies(<138 copies/mL). A negative result must be combined with clinical observations, patient history, and epidemiological information. The expected result is Negative.  Fact Sheet for Patients:  BloggerCourse.com  Fact Sheet for Healthcare Providers:  SeriousBroker.it  This test is no t yet approved or cleared by the [place] FDA and  has been authorized for detection and/or diagnosis of SARS-CoV-2 by FDA under an Emergency Use Authorization (EUA). This EUA will remain  in effect (meaning this test can be used) for the duration of the COVID-19 declaration under Section 564(b)(1) of the Act, 21 U.S.C.section 360bbb-3(b)(1), unless the authorization is terminated  or revoked sooner.       Influenza A by PCR NEGATIVE NEGATIVE Final   Influenza B by PCR NEGATIVE NEGATIVE Final    Comment: (NOTE) The Xpert Xpress SARS-CoV-2/FLU/RSV plus assay is intended as an aid in the diagnosis of influenza from Nasopharyngeal swab specimens and should not be used as a sole basis for treatment. Nasal washings and aspirates are unacceptable for Xpert Xpress SARS-CoV-2/FLU/RSV testing.  Fact Sheet for Patients: BloggerCourse.com  Fact Sheet for Healthcare  Providers: SeriousBroker.it  This test is not yet approved or cleared by the [place] FDA and has been authorized for detection and/or diagnosis of SARS-CoV-2 by FDA under an Emergency Use Authorization (EUA). This EUA will remain in effect (meaning this test can be used) for the duration of the COVID-19 declaration under Section 564(b)(1) of the Act, 21 U.S.C. section 360bbb-3(b)(1), unless the authorization is terminated or revoked.  Performed at [facility], [address], [place], [place] [address]   Aerobic/Anaerobic Culture w Gram Stain (surgical/deep wound)  Status: Abnormal (Preliminary result)   Collection Time: 05/18/21  9:27 PM   Specimen: PATH Other; Tissue  Result Value Ref Range Status   Specimen Description   Final    ABSCESS scrotal Performed at [facility], [address] [place], [place] [address]    Special Requests   Final    NONE Performed at [facility], [address] [place], [place] [address]    Gram Stain   Final    MODERATE WBC PRESENT,BOTH PMN AND MONONUCLEAR FEW GRAM VARIABLE ROD RARE GRAM POSITIVE COCCI IN PAIRS    Culture (A)  Final    MULTIPLE ORGANISMS PRESENT, NONE PREDOMINANT NO GROUP A STREP (S.PYOGENES) ISOLATED NO STAPHYLOCOCCUS AUREUS ISOLATED ABUNDANT BACTEROIDES OVATUS BETA LACTAMASE POSITIVE CULTURE REINCUBATED FOR BETTER GROWTH Performed at [facility], [address] [place], [place] [address]    Report Status PENDING  Incomplete  MRSA PCR Screening     Status: None   Collection Time: 05/19/21  1:14 AM   Specimen: Nasal Mucosa; Nasopharyngeal  Result Value Ref Range Status   MRSA by PCR NEGATIVE NEGATIVE Final    Comment:        The GeneXpert MRSA Assay (FDA approved for NASAL specimens only), is one component of a comprehensive MRSA colonization surveillance program. It is not intended to diagnose MRSA infection nor to guide or monitor  treatment for MRSA infections. Performed at [facility], [address], [place], [place] [address]          Radiology Studies: No results found.      Scheduled Meds:  Chlorhexidine Gluconate Cloth  6 each Topical Daily   insulin aspart  0-9 Units Subcutaneous Q4H   ipratropium-albuterol  3 mL Nebulization BID   midodrine  10 mg Oral TID WC   nicotine  21 mg Transdermal Daily   pantoprazole (PROTONIX) IV  40 mg Intravenous QHS   rosuvastatin  20 mg Oral Daily   senna  1 tablet Oral BID   sodium hypochlorite   Irrigation UD   Continuous Infusions:  sodium chloride 75 mL/hr at 05/23/21 0600   sodium chloride     sodium chloride     ampicillin-sulbactam (UNASYN) IV 3 g (05/23/21 0600)    Assessment & Plan:   Active Problems:   Hypertension   CAD in native artery   Morbid obesity with BMI of 50.0-59.9, adult (HCC)   Gastroesophageal reflux disease without esophagitis   Chronic diastolic CHF (congestive heart failure) (HCC)   Hyperlipidemia   Constipated   Abscess   Fournier's gangrene of scrotum   Fournier gangrene   Sepsis (HCC)   Functional quadriplegia (HCC)   Septic shock secondary to fournier's gangrene, necrotizing perineal infection s/p cystoscopy, scrotal exploration and debridement~06/9 return to the OR 6/11 Severe sepsis with acute organ dysfunction Hx: HTN, HLD, Tachycardia, and TIA Weaned off Levophed Hold oupt bp meds and eliquis Urology following Leukocytosis improved  Cx wound bacteroides ovatus, bac. Thetaiotaomicron, beta lactamase positive Wet to dry dressing changes daily No evidence of necrosis , hold debridment today Continue iv abx ID following- dc/'d Linozolid and changed zosyn to unasyn to start today.   Hyponatremia Acute renal failure secondary to hypotension Lactic acidosis - resolved Hx: Kidney stone Na level stable Renal function stabilizing now   COPD~stable MDI prn   Type II diabetes mellitus Bg  stable. riss   DVT prophylaxis: scd Code Status:full Family Communication: none at bedside  Status is: Inpatient  Remains inpatient appropriate because:Inpatient level of care appropriate due to severity of illness  Dispo: The patient is from: Home              Anticipated d/c is to: Home              Patient currently is not medically stable to d/c.   Difficult to place patient No            LOS: 5 days   Time spent: 35 min with >50% on coc    Lynn ItoSahar Lucresha Dismuke, MD [facility] Pager [phone]  If 7PM-7AM, please contact night-coverage 05/23/2021, 8:14 AM

## 2021-05-23 NOTE — Progress Notes (Signed)
PHARMACIST - PHYSICIAN COMMUNICATION   CONCERNING: IV to Oral Route Change Policy  RECOMMENDATION: This patient is receiving pantoprazole by the intravenous route.  Based on criteria approved by the Pharmacy and Therapeutics Committee, the intravenous medication(s) is/are being converted to the equivalent oral dose form(s).   DESCRIPTION: These criteria include: The patient is eating (either orally or via tube) and/or has been taking other orally administered medications for a least 24 hours The patient has no evidence of active gastrointestinal bleeding or impaired GI absorption (gastrectomy, short bowel, patient on TNA or NPO).  If you have questions about this conversion, please contact the Pharmacy Department   Tressie Ellis, [facility] 05/23/2021 9:14 AM

## 2021-05-23 NOTE — Progress Notes (Signed)
Patient c/o scrotal pain 8/10. Fentanyl given as ordered PRN. Dressing assessed. Bed mobility refused at this time. Will continue to monitor.

## 2021-05-24 DIAGNOSIS — R6521 Severe sepsis with septic shock: Secondary | ICD-10-CM | POA: Diagnosis not present

## 2021-05-24 DIAGNOSIS — I5032 Chronic diastolic (congestive) heart failure: Secondary | ICD-10-CM | POA: Diagnosis not present

## 2021-05-24 DIAGNOSIS — A419 Sepsis, unspecified organism: Secondary | ICD-10-CM | POA: Diagnosis not present

## 2021-05-24 LAB — BASIC METABOLIC PANEL
Anion gap: 8 (ref 5–15)
BUN: 9 mg/dL (ref 8–23)
CO2: 31 mmol/L (ref 22–32)
Calcium: 8 mg/dL — ABNORMAL LOW (ref 8.9–10.3)
Chloride: 97 mmol/L — ABNORMAL LOW (ref 98–111)
Creatinine, Ser: 1.19 mg/dL (ref 0.61–1.24)
GFR, Estimated: >60 mL/min (ref >60)
Glucose, Bld: 107 mg/dL — ABNORMAL HIGH (ref 70–99)
Potassium: 4 mmol/L (ref 3.5–5.1)
Sodium: 136 mmol/L (ref 135–145)

## 2021-05-24 LAB — GLUCOSE, CAPILLARY
Glucose-Capillary: 126 mg/dL — ABNORMAL HIGH (ref 70–99)
Glucose-Capillary: 106 mg/dL — ABNORMAL HIGH (ref 70–99)
Glucose-Capillary: 140 mg/dL — ABNORMAL HIGH (ref 70–99)
Glucose-Capillary: 108 mg/dL — ABNORMAL HIGH (ref 70–99)
Glucose-Capillary: 115 mg/dL — ABNORMAL HIGH (ref 70–99)

## 2021-05-24 LAB — MAGNESIUM: Magnesium: 2 mg/dL (ref 1.7–2.4)

## 2021-05-24 LAB — PHOSPHORUS: Phosphorus: 2.7 mg/dL (ref 2.5–4.6)

## 2021-05-24 MED ORDER — INSULIN ASPART 100 UNIT/ML IJ SOLN
0.0000 [IU] | Freq: Three times a day (TID) | INTRAMUSCULAR | Status: DC
  Administered 2021-05-25 – 2021-05-27 (×3): 1 [IU] via SUBCUTANEOUS
  Filled 2021-05-24 (×3): qty 1

## 2021-05-24 MED ORDER — LACTULOSE 10 GM/15ML PO SOLN
20.0000 g | Freq: Once | ORAL | Status: AC
  Administered 2021-05-24: 20 g via ORAL
  Filled 2021-05-24: qty 30

## 2021-05-24 NOTE — NC FL2 (Signed)
[place] MEDICAID FL2 LEVEL OF CARE SCREENING TOOL     IDENTIFICATION  Patient Name: Thomas Tanner Birthdate: 1951/03/04 Sex: male Admission Date (Current Location): 05/18/2021  [facility] and [place] Number:  Chiropodist and Address:  [facility], [address], [place], [place] [address]      Provider Number:    Attending Physician Name and Address:  Marrion Coy, MD  Relative Name and Phone Number:  Jarmel, Linhardt (Spouse)   [phone] (Home Phone    Current Level of Care: Hospital Recommended Level of Care: Skilled Nursing Facility Prior Approval Number:    Date Approved/Denied:   PASRR Number: [id number]  Discharge Plan: SNF    Current Diagnoses: Patient Active Problem List   Diagnosis Date Noted   Abscess 05/18/2021   Fournier's gangrene of scrotum 05/18/2021   Fournier gangrene 05/18/2021   Sepsis (HCC) 05/18/2021   Functional quadriplegia (HCC) 05/18/2021   Bacteremia due to Gram-negative bacteria 05/07/2021   Constipated 05/06/2021   Physical deconditioning 05/06/2021   Atherosclerosis of aorta (HCC) 09/28/2020   Acute on chronic heart failure (HCC) 08/02/2020   Acute saddle pulmonary embolism (HCC) 08/02/2020   Hypoxia 08/01/2020   Weakness 08/01/2020   Abnormality of gait and mobility 08/01/2020   AKI (acute kidney injury) (HCC) 07/21/2020   Hypotension 07/20/2020   Leukocytosis 07/20/2020   Cellulitis of right lower extremity 07/20/2020   Pressure injury of skin 07/12/2020   Acute respiratory failure with hypoxia (HCC) 07/10/2020   COPD with acute exacerbation (HCC)    Elevated troponin    Chronic diastolic CHF (congestive heart failure) (HCC) 05/24/2020   Anasarca    Acute renal failure superimposed on stage 3a chronic kidney disease (HCC)    Hyperlipidemia    Tobacco abuse    Shortness of breath 04/28/2020   Upper respiratory tract infection due to COVID-19 virus 04/28/2020   Edema 04/28/2020    Encounter for general adult medical examination with abnormal findings 01/15/2019   Acute non-recurrent maxillary sinusitis 01/15/2019   Gastroesophageal reflux disease without esophagitis 01/15/2019   CAD in native artery 12/16/2018   Morbid obesity with BMI of 50.0-59.9, adult (HCC) 12/16/2018   Cigarette smoker motivated to quit 12/16/2018   Chronic pain of right knee 08/04/2018   Mild intermittent asthma 08/04/2018   Impaired fasting glucose 04/03/2018   Low back pain with right-sided sciatica 03/05/2018   Chronic right-sided low back pain with right-sided sciatica 03/05/2018   Pain in right hip 03/05/2018   Hypertension 03/05/2018   Foreign body in bladder and urethra 06/09/2013   Kidney stone 06/09/2013   Ureteric stone 06/09/2013   Kidney stone 06/09/2013    Orientation RESPIRATION BLADDER Height & Weight     Self, Time, Situation, Place  Normal Incontinent Weight: (!) 421 lb (191 kg) Height:  6\' 1"  (185.4 cm)  BEHAVIORAL SYMPTOMS/MOOD NEUROLOGICAL BOWEL NUTRITION STATUS      Incontinent Diet  AMBULATORY STATUS COMMUNICATION OF NEEDS Skin   Extensive Assist Verbally Other (Comment) (Fournier's Gangrene, od the scrotum)                       Personal Care Assistance Level of Assistance  Bathing, Dressing, Total care, Feeding Bathing Assistance: Limited assistance Feeding assistance: Limited assistance Dressing Assistance: Limited assistance Total Care Assistance: Limited assistance   Functional Limitations Info  Sight, Hearing, Speech Sight Info: Adequate Hearing Info: Adequate Speech Info: Adequate    SPECIAL CARE FACTORS FREQUENCY  PT (By  licensed PT), OT (By licensed OT)     PT Frequency: 5X per week OT Frequency: 5X per week            Contractures Contractures Info: Present (Functional Quadripligia)    Additional Factors Info                  Current Medications (05/24/2021):  This is the current hospital active medication  list Current Facility-Administered Medications  Medication Dose Route Frequency Provider Last Rate Last Admin   0.9 %  sodium chloride infusion  250 mL Intravenous Continuous Stoioff, Scott C, MD       0.9 %  sodium chloride infusion  250 mL Intravenous Continuous Stoioff, Scott C, MD       acetaminophen (TYLENOL) tablet 650 mg  650 mg Oral Q6H PRN Stoioff, Scott C, MD   650 mg at 05/23/21 0950   Or   acetaminophen (TYLENOL) suppository 650 mg  650 mg Rectal Q6H PRN Stoioff, Scott C, MD       albuterol (PROVENTIL) (2.5 MG/3ML) 0.083% nebulizer solution 2.5 mg  2.5 mg Nebulization Q2H PRN Stoioff, Scott C, MD       Ampicillin-Sulbactam (UNASYN) 3 g in sodium chloride 0.9 % 100 mL IVPB  3 g Intravenous Q6H Ravishankar, Rhodia Albright, MD 200 mL/hr at 05/24/21 1151 3 g at 05/24/21 1151   Chlorhexidine Gluconate Cloth 2 % PADS 6 each  6 each Topical Daily Stoioff, Scott C, MD   6 each at 05/24/21 1004   fentaNYL (SUBLIMAZE) injection 50-100 mcg  50-100 mcg Intravenous Q4H PRN Stoioff, Scott C, MD   100 mcg at 05/24/21 1152   insulin aspart (novoLOG) injection 0-9 Units  0-9 Units Subcutaneous Q4H Stoioff, Scott C, MD   1 Units at 05/24/21 1152   ipratropium-albuterol (DUONEB) 0.5-2.5 (3) MG/3ML nebulizer solution 3 mL  3 mL Nebulization BID Stoioff, Scott C, MD   3 mL at 05/24/21 0812   lactulose (CHRONULAC) 10 GM/15ML solution 20 g  20 g Oral Once Marrion Coy, MD       midodrine (PROAMATINE) tablet 10 mg  10 mg Oral TID WC Stoioff, Scott C, MD   10 mg at 05/24/21 1152   nicotine (NICODERM CQ - dosed in mg/24 hours) patch 21 mg  21 mg Transdermal Daily Stoioff, Scott C, MD   21 mg at 05/24/21 1004   pantoprazole (PROTONIX) EC tablet 40 mg  40 mg Oral QHS Dorothea Ogle B, RPH   40 mg at 05/23/21 2211   polyethylene glycol (MIRALAX / GLYCOLAX) packet 17 g  17 g Oral Daily PRN Stoioff, Scott C, MD   17 g at 05/23/21 0948   rosuvastatin (CRESTOR) tablet 20 mg  20 mg Oral Daily Stoioff, Scott C, MD   20 mg at  05/24/21 1003   senna (SENOKOT) tablet 8.6 mg  1 tablet Oral BID Stoioff, Scott C, MD   8.6 mg at 05/24/21 1003   sodium hypochlorite (DAKIN'S 1/2 STRENGTH) topical solution   Irrigation UD Stoioff, Verna Czech, MD         Discharge Medications: Please see discharge summary for a list of discharge medications.  Relevant Imaging Results:  Relevant Lab Results:   Additional Information SS# [id number]  Joseph Art, LCSWA

## 2021-05-24 NOTE — Progress Notes (Signed)
Pt transferred to room 211. Daughter Britt Boozer made aware.

## 2021-05-24 NOTE — Progress Notes (Signed)
   Date of Admission:  05/18/2021    ID: Thomas Tanner is a 70 y.o. male   Active Problems:   Hypertension   CAD in native artery   Morbid obesity with BMI of 50.0-59.9, adult (HCC)   Gastroesophageal reflux disease without esophagitis   Chronic diastolic CHF (congestive heart failure) (HCC)   Hyperlipidemia   Constipated   Abscess   Fournier's gangrene of scrotum   Fournier gangrene   Sepsis (HCC)   Functional quadriplegia (HCC)    Subjective: Doing better No complaints No fever No pain  Medications:   Chlorhexidine Gluconate Cloth  6 each Topical Daily   insulin aspart  0-9 Units Subcutaneous Q4H   ipratropium-albuterol  3 mL Nebulization BID   midodrine  10 mg Oral TID WC   nicotine  21 mg Transdermal Daily   pantoprazole  40 mg Oral QHS   rosuvastatin  20 mg Oral Daily   senna  1 tablet Oral BID   sodium hypochlorite   Irrigation UD    Objective: Vital signs in last 24 hours: Temp:  [97.9 F (36.6 C)-98.3 F (36.8 C)] 97.9 F (36.6 C) (06/15 0800) Pulse Rate:  [78-96] 88 (06/15 1000) Resp:  [12-21] 17 (06/15 1000) BP: (91-144)/(43-98) 129/87 (06/15 1000) SpO2:  [91 %-100 %] 96 % (06/15 1000)  PHYSICAL EXAM:  General: Alert, cooperative, no distress,  Lungs: Clear to auscultation bilaterally. No Wheezing or Rhonchi. No rales. Heart: Regular rate and rhythm, no murmur, rub or gallop. Abdomen: Soft, non-tender,not distended. Bowel sounds normal. No masses Scrotal wound covered with dressing Extremities: atraumatic, no cyanosis. No edema. No clubbing Skin: No rashes or lesions. Or bruising Lymph: Cervical, supraclavicular normal. Neurologic: Grossly non-focal  Lab Results Recent Labs    05/22/21 0411 05/23/21 0601 05/24/21 0431  WBC 5.3  --   --   HGB 7.8*  --   --   HCT 24.7*  --   --   NA 132* 136 136  K 4.6 4.1 4.0  CL 96* 98 97*  CO2 31 33* 31  BUN 10 10 9   CREATININE 1.37* 1.24 1.19     Assessment/Plan: Fournier's gangrene.   Status post I&D and debridement x3. Was on Zosyn and linezolid.  Cultures had Bacteroides and hence was switched over to Unasyn.  Will need  at least  2-3 weeksOf antibiotics.   When ready to be discharged can be switched to PO Augmentin until 06/08/21  Recent Bacteroides bacteremia.  Repeat culture negative  Morbid obesity  CT showed b/l renal exophytic lesions- urology on board  Discussed the management with the care team

## 2021-05-24 NOTE — Progress Notes (Addendum)
PROGRESS NOTE    Thomas Tanner  VOH:[id number] DOB: [birth year] DOA: 05/18/2021 PCP: Theotis Burrow, NP (Inactive)    Brief Narrative:  This is a 70 yo male who presented to [facility] on 06/9 with c/o constipation, abdominal pain, and scrotal pain.  However, scrotal ultrasound and CT Pelvis revealed extensive gas in the scrotum consistent with Fournier's Gangrene, and also noted upon examination to have extensive purulent drainage from the midline of the scrotum.  ER physician consulted Urology and pt transported emergently to the OR.  He required cystoscopy; urethral dilation of urethral stricture; complex catheter placement; scrotal exploration and debridement of skin/subcutaneous tissue/muscle/ and fascia.  He was subsequently admitted to the stepdown unit per hospitalist team for additional workup and treatment.  On 06/10 he developed hypotension requiring peripheral levophed gtt. Patient was initially treated with Zosyn and Zyvox.  Culture from wound had multiple bacterial growth.  Changed to Unasyn after seen by ID.  Assessment & Plan:   Active Problems:   Hypertension   CAD in native artery   Morbid obesity with BMI of 50.0-59.9, adult (HCC)   Gastroesophageal reflux disease without esophagitis   Chronic diastolic CHF (congestive heart failure) (HCC)   Hyperlipidemia   Constipated   Abscess   Fournier's gangrene of scrotum   Fournier gangrene   Sepsis (HCC)   Functional quadriplegia (HCC)  #1.  Septic shock with severe sepsis secondary to Fournier's gangrene, necrotizing perineal infection. Fournier's gangrene, Perineal infection status postdebridement. Patient is off pressors for 48 hours now, blood pressure has been stable. Appreciate ID consult.  Continue Unasyn per the recommendation. Patient seems also had recent bacteremia, requiring at least 2 weeks of Unasyn. Patient is looking for nursing home placement, will place a midline/PICC line before discharge.  #2.   Acute kidney injury secondary to septic shock. Lactic acidosis secondary to septic shock. Hyponatremia. Renal function has been normalized.  Discontinue IV fluids.  #3.  Type 2 diabetes. Continue sliding scale insulin.  4.  COPD with chronic hypoxemic respiratory failure. Chronic diastolic congestive heart failure. Stable.  5. constipation. Gave dose of lactulose.  Obtain PT/OT for generalized weakness.    DVT prophylaxis: SCDs Code Status: Full Family Communication:  Disposition Plan:    Status is: Inpatient  Remains inpatient appropriate because:IV treatments appropriate due to intensity of illness or inability to take PO and Inpatient level of care appropriate due to severity of illness  Dispo: The patient is from: Home              Anticipated d/c is to: SNF              Patient currently is not medically stable to d/c.   Difficult to place patient No        I/O last 3 completed shifts: In: 3724.6 [P.O.:840; I.V.:2314.2; IV Piggyback:570.5] Out: 3825 [Urine:3825] Total I/O In: -  Out: 900 [Urine:900]     Consultants:  ID, pulm  Procedures: I&D  Antimicrobials: Unasyn  Subjective: Patient doing well today, still on 3 L oxygen which is baseline.  Does not feel short of breath. He slept well last night without paroxysmal nocturnal dyspnea. No chest pain or palpitation. No fever or chills. No dysuria hematuria No abdominal pain or nausea vomiting.  Last bowel movement was 7 days ago.   Objective: Vitals:   05/24/21 0615 05/24/21 0630 05/24/21 0800 05/24/21 1000  BP:  (!) 109/52 (!) 144/55 129/87  Pulse: 86 91 88 88  Resp:  Temp:   97.9 F (36.6 C)   TempSrc:   Oral   SpO2: 98% 96% 98% 96%  Weight:      Height:        Intake/Output Summary (Last 24 hours) at 05/24/2021 1212 Last data filed at 05/24/2021 1000 Gross per 24 hour  Intake 2296.59 ml  Output 3575 ml  Net -1278.41 ml   Filed Weights   05/18/21 1600  Weight: (!)  191 kg    Examination:  General exam: Appears calm and comfortable  Respiratory system: Clear to auscultation. Respiratory effort normal. Cardiovascular system: S1 & S2 heard, RRR. No JVD, murmurs, rubs, gallops or clicks. No pedal edema. Gastrointestinal system: Abdomen is nondistended, soft and nontender. No organomegaly or masses felt. Normal bowel sounds heard. Central nervous system: Alert and oriented. No focal neurological deficits. Extremities: Symmetric 5 x 5 power. Skin: No rashes, lesions or ulcers Psychiatry: Judgement and insight appear normal. Mood & affect appropriate.     Data Reviewed: I have personally reviewed following labs and imaging studies  CBC: Recent Labs  Lab 05/18/21 1607 05/18/21 2210 05/19/21 0226 05/19/21 0956 05/19/21 1616 05/19/21 2147 05/20/21 0551 05/21/21 0500 05/22/21 0411  WBC 14.0*   < > 22.0*   < > 17.3* 14.7* 13.4* 8.5 5.3  NEUTROABS 11.1*  --  19.5*  --   --   --   --   --   --   HGB 12.7*   < > 10.3*   < > 10.0* 9.4* 9.2* 8.8* 7.8*  HCT 37.9*   < > 31.0*   < > 30.5* 29.0* 28.6* 24.4* 24.7*  MCV 89.2   < > 95.7   < > 92.7 92.1 92.0 96.1 93.6  PLT 350   < > 350   < > 393 395 413* 412* 313   < > = values in this interval not displayed.   Basic Metabolic Panel: Recent Labs  Lab 05/19/21 0226 05/19/21 0533 05/21/21 0500 05/22/21 0411 05/23/21 0601 05/24/21 0431  NA 132* 133* 133* 132* 136 136  K 4.3 4.1 4.0 4.6 4.1 4.0  CL 90* 91* 94* 96* 98 97*  CO2 28 30 33* 31 33* 31  GLUCOSE 166* 158* 135* 144* 90 107*  BUN CREATININE 1.80* 1.99* 1.37* 1.37* 1.24 1.19  CALCIUM 8.0* 8.0* 7.8* 7.7* 8.1* 8.0*  MG 2.1  --  2.0 2.2 2.2 2.0  PHOS 5.1*  --  2.8 2.2* 3.2 2.7   GFR: Estimated Creatinine Clearance: 104.5 mL/min (by C-G formula based on SCr of 1.19 mg/dL). Liver Function Tests: Recent Labs  Lab 05/18/21 2210 05/19/21 0226  AST 23 20  ALT 30 28  ALKPHOS 79 68  BILITOT 1.6* 1.7*  PROT 6.7 5.9*   ALBUMIN 2.6* 2.4*   No results for input(s): LIPASE, AMYLASE in the last 168 hours. No results for input(s): AMMONIA in the last 168 hours. Coagulation Profile: No results for input(s): INR, PROTIME in the last 168 hours. Cardiac Enzymes: No results for input(s): CKTOTAL, CKMB, CKMBINDEX, TROPONINI in the last 168 hours. BNP (last 3 results) No results for input(s): PROBNP in the last 8760 hours. HbA1C: No results for input(s): HGBA1C in the last 72 hours. CBG: Recent Labs  Lab 05/23/21 1934 05/23/21 2321 05/24/21 0343 05/24/21 0739 05/24/21 1122  GLUCAP 130* 113* 126* 106* 140*   Lipid Profile: No results for input(s): CHOL, HDL, LDLCALC, TRIG, CHOLHDL, LDLDIRECT  in the last 72 hours. Thyroid Function Tests: No results for input(s): TSH, T4TOTAL, FREET4, T3FREE, THYROIDAB in the last 72 hours. Anemia Panel: No results for input(s): VITAMINB12, FOLATE, FERRITIN, TIBC, IRON, RETICCTPCT in the last 72 hours. Sepsis Labs: Recent Labs  Lab 05/18/21 1607 05/18/21 2210 05/19/21 0226 05/19/21 0533  PROCALCITON  --  2.04  --   --   LATICACIDVEN 2.5*  --  3.9* 2.0*    Recent Results (from the past 240 hour(s))  SARS CORONAVIRUS 2 (TAT 6-24 HRS) Nasopharyngeal Nasopharyngeal Swab     Status: None   Collection Time: 05/14/21  5:42 PM   Specimen: Nasopharyngeal Swab  Result Value Ref Range Status   SARS Coronavirus 2 NEGATIVE NEGATIVE Final    Comment: (NOTE) SARS-CoV-2 target nucleic acids are NOT DETECTED.  The SARS-CoV-2 RNA is generally detectable in upper and lower respiratory specimens during the acute phase of infection. Negative results do not preclude SARS-CoV-2 infection, do not rule out co-infections with other pathogens, and should not be used as the sole basis for treatment or other patient management decisions. Negative results must be combined with clinical observations, patient history, and epidemiological information. The expected result is  Negative.  Fact Sheet for Patients: HairSlick.no  Fact Sheet for Healthcare Providers: quierodirigir.com  This test is not yet approved or cleared by the [place] FDA and  has been authorized for detection and/or diagnosis of SARS-CoV-2 by FDA under an Emergency Use Authorization (EUA). This EUA will remain  in effect (meaning this test can be used) for the duration of the COVID-19 declaration under Se ction 564(b)(1) of the Act, 21 U.S.C. section 360bbb-3(b)(1), unless the authorization is terminated or revoked sooner.  Performed at [facility], [address], [place], [place] [address]   Culture, blood (routine x 2)     Status: None   Collection Time: 05/18/21  8:23 PM   Specimen: BLOOD  Result Value Ref Range Status   Specimen Description BLOOD BLOOD LEFT FOREARM  Final   Special Requests   Final    BOTTLES DRAWN AEROBIC AND ANAEROBIC Blood Culture results may not be optimal due to an inadequate volume of blood received in culture bottles   Culture   Final    NO GROWTH 5 DAYS Performed at [facility], [address], [place], [place] [address]    Report Status 05/23/2021 FINAL  Final  Culture, blood (routine x 2)     Status: None   Collection Time: 05/18/21  8:23 PM   Specimen: BLOOD  Result Value Ref Range Status   Specimen Description BLOOD BLOOD RIGHT HAND  Final   Special Requests   Final    BOTTLES DRAWN AEROBIC AND ANAEROBIC Blood Culture adequate volume   Culture   Final    NO GROWTH 5 DAYS Performed at [facility], [address] [place], [place] [address]    Report Status 05/23/2021 FINAL  Final  Resp Panel by RT-PCR (Flu A&B, Covid) Nasopharyngeal Swab     Status: None   Collection Time: 05/18/21  8:23 PM   Specimen: Nasopharyngeal Swab; Nasopharyngeal(NP) swabs in vial transport medium  Result Value Ref Range Status   SARS Coronavirus 2 by RT PCR NEGATIVE NEGATIVE  Final    Comment: (NOTE) SARS-CoV-2 target nucleic acids are NOT DETECTED.  The SARS-CoV-2 RNA is generally detectable in upper respiratory specimens during the acute phase of infection. The lowest concentration of SARS-CoV-2 viral copies this assay can detect  is 138 copies/mL. A negative result does not preclude SARS-Cov-2 infection and should not be used as the sole basis for treatment or other patient management decisions. A negative result may occur with  improper specimen collection/handling, submission of specimen other than nasopharyngeal swab, presence of viral mutation(s) within the areas targeted by this assay, and inadequate number of viral copies(<138 copies/mL). A negative result must be combined with clinical observations, patient history, and epidemiological information. The expected result is Negative.  Fact Sheet for Patients:  BloggerCourse.com  Fact Sheet for Healthcare Providers:  SeriousBroker.it  This test is no t yet approved or cleared by the [place] FDA and  has been authorized for detection and/or diagnosis of SARS-CoV-2 by FDA under an Emergency Use Authorization (EUA). This EUA will remain  in effect (meaning this test can be used) for the duration of the COVID-19 declaration under Section 564(b)(1) of the Act, 21 U.S.C.section 360bbb-3(b)(1), unless the authorization is terminated  or revoked sooner.       Influenza A by PCR NEGATIVE NEGATIVE Final   Influenza B by PCR NEGATIVE NEGATIVE Final    Comment: (NOTE) The Xpert Xpress SARS-CoV-2/FLU/RSV plus assay is intended as an aid in the diagnosis of influenza from Nasopharyngeal swab specimens and should not be used as a sole basis for treatment. Nasal washings and aspirates are unacceptable for Xpert Xpress SARS-CoV-2/FLU/RSV testing.  Fact Sheet for Patients: BloggerCourse.com  Fact Sheet for Healthcare  Providers: SeriousBroker.it  This test is not yet approved or cleared by the [place] FDA and has been authorized for detection and/or diagnosis of SARS-CoV-2 by FDA under an Emergency Use Authorization (EUA). This EUA will remain in effect (meaning this test can be used) for the duration of the COVID-19 declaration under Section 564(b)(1) of the Act, 21 U.S.C. section 360bbb-3(b)(1), unless the authorization is terminated or revoked.  Performed at [facility], [address], [place], [place] [address]   Aerobic/Anaerobic Culture w Gram Stain (surgical/deep wound)     Status: Abnormal   Collection Time: 05/18/21  9:27 PM   Specimen: PATH Other; Tissue  Result Value Ref Range Status   Specimen Description   Final    ABSCESS scrotal Performed at [facility], [address], [place], [place] [address]    Special Requests   Final    NONE Performed at [facility], [address] [place], [place] [address]    Gram Stain   Final    MODERATE WBC PRESENT,BOTH PMN AND MONONUCLEAR FEW GRAM VARIABLE ROD RARE GRAM POSITIVE COCCI IN PAIRS    Culture (A)  Final    MULTIPLE ORGANISMS PRESENT, NONE PREDOMINANT NO GROUP A STREP (S.PYOGENES) ISOLATED NO STAPHYLOCOCCUS AUREUS ISOLATED ABUNDANT BACTEROIDES OVATUS MODERATE BACTEROIDES THETAIOTAOMICRON BETA LACTAMASE POSITIVE Performed at [facility], [address], [place], [place] [address]    Report Status 05/23/2021 FINAL  Final  MRSA PCR Screening     Status: None   Collection Time: 05/19/21  1:14 AM   Specimen: Nasal Mucosa; Nasopharyngeal  Result Value Ref Range Status   MRSA by PCR NEGATIVE NEGATIVE Final    Comment:        The GeneXpert MRSA Assay (FDA approved for NASAL specimens only), is one component of a comprehensive MRSA colonization surveillance program. It is not intended to diagnose MRSA infection nor to guide or monitor treatment for MRSA  infections. Performed at [facility], [address] [place], [place] [address]  Radiology Studies: No results found.      Scheduled Meds:  Chlorhexidine Gluconate Cloth  6 each Topical Daily   insulin aspart  0-9 Units Subcutaneous Q4H   ipratropium-albuterol  3 mL Nebulization BID   midodrine  10 mg Oral TID WC   nicotine  21 mg Transdermal Daily   pantoprazole  40 mg Oral QHS   rosuvastatin  20 mg Oral Daily   senna  1 tablet Oral BID   sodium hypochlorite   Irrigation UD   Continuous Infusions:  sodium chloride 75 mL/hr at 05/24/21 0914   sodium chloride     sodium chloride     ampicillin-sulbactam (UNASYN) IV 3 g (05/24/21 1151)     LOS: 6 days    Time spent: 32 minutes    Marrion Coyekui Kileigh Ortmann, MD [facility]   To contact the attending provider between 7A-7P or the covering provider during after hours 7P-7A, please log into the web site [email or web address] and access using universal [place] password for that web site. If you do not have the password, please call the hospital operator.  05/24/2021, 12:12 PM

## 2021-05-24 NOTE — Progress Notes (Signed)
NAME:  Thomas Tanner, MRN:  [id number], DOB:  [birth year], LOS: 6 ADMISSION DATE:  05/18/2021, CONSULTATION DATE: 05/19/2021 REFERRING MD: Dr. Arville Care, CHIEF COMPLAINT: Hypotension   History of Present Illness:  This is a 70 yo male who presented to [facility] ER on 06/9 with c/o constipation, abdominal pain, and scrotal pain.  However, scrotal ultrasound and CT Pelvis revealed extensive gas in the scrotum consistent with Fournier's Gangrene, and also noted upon examination to have extensive purulent drainage from the midline of the scrotum.  ER physician consulted Urology and pt transported emergently to the OR.  He required cystoscopy; urethral dilation of urethral stricture; complex catheter placement; scrotal exploration and debridement of skin/subcutaneous tissue/muscle/ and fascia.  He was subsequently admitted to the stepdown unit per hospitalist team for additional workup and treatment.  On 06/10 he developed hypotension requiring peripheral levophed gtt PCCM team consulted to assist with management.   Pertinent  Medical History  Arthritis CHF COPD Type II Diabetes Mellitus HLD HTN Kidney Stones Obesity  Tachycardia  TIA   Significant Hospital Events: Including procedures, antibiotic start and stop dates in addition to other pertinent events   06/9: Pt admitted with Fournier's Gangrene, necrotizing perineal infection s/p cystoscopy, scrotal exploration and debridement  06/10: Pt developed hypotension requiring levophed gtt PCCM consulted to assist with management  06/11: Back to the OR for debridement, still requiring low-dose Levophed, midodrine.  05/22/21- patient is more stable.  MAP is 50 during my evaluation but no vasopressor use today.  Patient is communicative he was able to tell stories for 2 hours today.  Plan to transfer to stepdown today.  05/23/21- patient is optimized for downgrade to PCU. Have reviewed care plan with Dr Marylu Lund today.  05/24/21-patient was hypotensive  yesterday and did not move to floor due to concern for vital signs.  Today he is better, reviewed plan with Dr Chipper Herb and we will plan to transfer today.     Interim History / Subjective:  Taken back to the OR.  After OR no overt complaint.  Objective   Blood pressure (!) 144/55, pulse 88, temperature 97.9 F (36.6 C), temperature source Oral, resp. rate 17, height 6\' 1"  (1.854 m), weight (!) 191 kg, SpO2 98 %.      Intake/Output Summary (Last 24 hours) at 05/24/2021 0830 Last data filed at 05/24/2021 05/26/2021 Gross per 24 hour  Intake 2536.59 ml  Output 2825 ml  Net -288.41 ml    Filed Weights   05/18/21 1600  Weight: (!) 191 kg    Examination: General: Obese,  chronically ill appearing male, NAD resting in bed  HENT: large neck, no JVD present  Lungs: clear throughout, even, non labored  Cardiovascular: sinus rhythm with PVC's, no R/G, 2+ radial/1+ distal Abdomen: +BS x4, obese, soft, non tender, non distended Extremities: normal bulk and tone, no edema  Neuro: alert/oriented, follows commands, PERRL GU: scrotal surgical site with dressing intact;    Assessment & Plan:  Septic shock secondary to fournier's gangrene, necrotizing perineal infection s/p cystoscopy, scrotal exploration and debridement~06/9 return to the OR 6/11 Severe sepsis with acute organ dysfunction Hx: HTN, HLD, Tachycardia, and TIA  Continuous telemetry monitoring  Continue gentle fluid resuscitation  Wean Levophed gtt to maintain map >65 Midodrine Hold outpatient antihypertensives and apixaban  Continue outpatient rosuvastatin  Trend WBC and monitor fever curve  Trend PCT  Continue zosyn and linezolid  Follow cultures  Urology consulted appreciate input   Hyponatremia  Acute renal failure secondary  to hypotension  Lactic acidosis - resolved Hx: Kidney stone  Trend BMP and lactic acid  Monitor UOP Avoid nephrotoxic medications  Renal function stabilizing  COPD~stable  PRN bronchodilator  therapy   Type II diabetes mellitus  CBG's q4hrs  SSI   Best practice (right click and "Reselect all SmartList Selections" daily)  Diet:  NPO due to OR, clear liquids otherwise Pain/Anxiety/Delirium protocol (if indicated): No VAP protocol (if indicated): Not indicated DVT prophylaxis: Contraindicated GI prophylaxis: PPI Glucose control:  SSI Yes Central venous access:  N/A Arterial line:  Yes, and it is still needed Foley:  N/A Mobility:  OOB  PT consulted: Yes Last date of multidisciplinary goals of care discussion [N/A] Code Status:  full code Disposition: ICU  Labs   CBC: Recent Labs  Lab 05/18/21 1607 05/18/21 2210 05/19/21 0226 05/19/21 0956 05/19/21 1616 05/19/21 2147 05/20/21 0551 05/21/21 0500 05/22/21 0411  WBC 14.0*   < > 22.0*   < > 17.3* 14.7* 13.4* 8.5 5.3  NEUTROABS 11.1*  --  19.5*  --   --   --   --   --   --   HGB 12.7*   < > 10.3*   < > 10.0* 9.4* 9.2* 8.8* 7.8*  HCT 37.9*   < > 31.0*   < > 30.5* 29.0* 28.6* 24.4* 24.7*  MCV 89.2   < > 95.7   < > 92.7 92.1 92.0 96.1 93.6  PLT 350   < > 350   < > 393 395 413* 412* 313   < > = values in this interval not displayed.     Basic Metabolic Panel: Recent Labs  Lab 05/19/21 0226 05/19/21 0533 05/21/21 0500 05/22/21 0411 05/23/21 0601 05/24/21 0431  NA 132* 133* 133* 132* 136 136  K 4.3 4.1 4.0 4.6 4.1 4.0  CL 90* 91* 94* 96* 98 97*  CO2 28 30 33* 31 33* 31  GLUCOSE 166* 158* 135* 144* 90 107*  BUN 18 20 12 10 10 9   CREATININE 1.80* 1.99* 1.37* 1.37* 1.24 1.19  CALCIUM 8.0* 8.0* 7.8* 7.7* 8.1* 8.0*  MG 2.1  --  2.0 2.2 2.2 2.0  PHOS 5.1*  --  2.8 2.2* 3.2 2.7    GFR: Estimated Creatinine Clearance: 104.5 mL/min (by C-G formula based on SCr of 1.19 mg/dL). Recent Labs  Lab 05/18/21 1607 05/18/21 2210 05/19/21 0226 05/19/21 0533 05/19/21 0956 05/19/21 2147 05/20/21 0551 05/21/21 0500 05/22/21 0411  PROCALCITON  --  2.04  --   --   --   --   --   --   --   WBC 14.0* 17.4* 22.0*  --     < > 14.7* 13.4* 8.5 5.3  LATICACIDVEN 2.5*  --  3.9* 2.0*  --   --   --   --   --    < > = values in this interval not displayed.     Liver Function Tests: Recent Labs  Lab 05/18/21 2210 05/19/21 0226  AST 23 20  ALT 30 28  ALKPHOS 79 68  BILITOT 1.6* 1.7*  PROT 6.7 5.9*  ALBUMIN 2.6* 2.4*    No results for input(s): LIPASE, AMYLASE in the last 168 hours. No results for input(s): AMMONIA in the last 168 hours.  ABG    Component Value Date/Time   HCO3 25.4 07/02/2020 1136   O2SAT 91.0 07/02/2020 1136      Coagulation Profile: No results for input(s): INR, PROTIME in the last  168 hours.  Cardiac Enzymes: No results for input(s): CKTOTAL, CKMB, CKMBINDEX, TROPONINI in the last 168 hours.  HbA1C: Hemoglobin A1C  Date/Time Value Ref Range Status  06/11/2013 04:04 AM 6.7 (H) 4.2 - 6.3 % Final    Comment:    The American Diabetes Association recommends that a primary goal of therapy should be <7% and that physicians should reevaluate the treatment regimen in patients with HbA1c values consistently >8%.    Hgb A1c MFr Bld  Date/Time Value Ref Range Status  05/19/2021 05:33 AM 6.4 (H) 4.8 - 5.6 % Final    Comment:    (NOTE)         Prediabetes: 5.7 - 6.4         Diabetes: >6.4         Glycemic control for adults with diabetes: <7.0   05/27/2020 05:46 AM 6.4 (H) 4.8 - 5.6 % Final    Comment:    (NOTE)         Prediabetes: 5.7 - 6.4         Diabetes: >6.4         Glycemic control for adults with diabetes: <7.0     CBG: Recent Labs  Lab 05/23/21 1558 05/23/21 1934 05/23/21 2321 05/24/21 0343 05/24/21 0739  GLUCAP 112* 130* 113* 126* 106*     Review of Systems: Positives in BOLD   Gen: Denies fever, chills, weight change, fatigue, night sweats HEENT: Denies blurred vision, double vision, hearing loss, tinnitus, sinus congestion, rhinorrhea, sore throat, neck stiffness, dysphagia PULM: Denies shortness of breath, cough, sputum production,  hemoptysis, wheezing CV: Denies chest pain, edema, orthopnea, paroxysmal nocturnal dyspnea, palpitations GI: abdominal pain, nausea, vomiting, diarrhea, hematochezia, melena, constipation, scrotal pain/swelling, change in bowel habits GU: Denies dysuria, hematuria, polyuria, oliguria, urethral discharge Endocrine: Denies hot or cold intolerance, polyuria, polyphagia or appetite change Derm: Denies rash, dry skin, scaling or peeling skin change Heme: Denies easy bruising, bleeding, bleeding gums Neuro: Denies headache, numbness, weakness, slurred speech, loss of memory or consciousness   Allergies Allergies  Allergen Reactions   Morphine Anxiety and Other (See Comments)    Hallucinations   Morphine And Related Anxiety     Home Medications  Prior to Admission medications   Medication Sig Start Date End Date Taking? Authorizing Provider  amoxicillin-clavulanate (AUGMENTIN) 875-125 MG tablet Take 1 tablet by mouth 2 (two) times daily for 4 days. 05/17/21 05/21/21  Tresa Moore, MD  apixaban (ELIQUIS) 5 MG TABS tablet Take 1 tablet (5 mg total) by mouth 2 (two) times daily. 08/16/20 02/12/21  Theotis Burrow, NP  famotidine (PEPCID) 20 MG tablet Take 1 tablet (20 mg total) by mouth 2 (two) times daily. 08/30/20   Carlean Jews, NP  furosemide (LASIX) 40 MG tablet Take 1 tablet (40 mg total) by mouth daily. 05/16/21   Tresa Moore, MD  Boris Lown Oil (OMEGA-3) 500 MG CAPS Take 1 tab po daily Patient taking differently: Take 500 mg by mouth daily. 06/04/19   Carlean Jews, NP  lisinopril (ZESTRIL) 10 MG tablet TAKE 1 TABLET BY MOUTH ONCE DAILY 05/06/21   Lyndon Code, MD  metoprolol succinate (TOPROL-XL) 50 MG 24 hr tablet Take 1 tablet by mouth once daily 04/20/21   Lyndon Code, MD  nicotine (NICODERM CQ - DOSED IN MG/24 HOURS) 21 mg/24hr patch Place 1 patch (21 mg total) onto the skin daily. 08/01/20   Carlean Jews, NP  phenylephrine-shark liver oil-mineral oil-petrolatum  (  PREPARATION H) 0.25-14-74.9 % rectal ointment Place 1 application rectally 2 (two) times daily as needed for hemorrhoids. 05/16/21   Sreenath, Sudheer B, MD  polyethylene glycol (MIRALAX / GLYCOLAX) 17 g packet Take 17 g by mouth daily. 05/17/21   Tresa Moore, MD  rosuvastatin (CRESTOR) 20 MG tablet Take 1 tablet (20 mg total) by mouth daily. 05/16/20   Carlean Jews, NP  senna-docusate (SENOKOT-S) 8.6-50 MG tablet Take 1 tablet by mouth 2 (two) times daily. 05/16/21   Tresa Moore, MD  VENTOLIN HFA 108 (90 Base) MCG/ACT inhaler Inhale 2 puffs into the lungs every 6 (six) hours as needed for wheezing or shortness of breath. 08/09/20   Carlean Jews, NP       Vida Rigger, M.D.  Pulmonary & Critical Care Medicine  [facility]   *This note was dictated using voice recognition software/Dragon.  Despite best efforts to proofread, errors can occur which can change the meaning.  Any change was purely unintentional.

## 2021-05-24 NOTE — TOC Initial Note (Signed)
Transition of Care [facility]) - Initial/Assessment Note    Patient Details  Name: Thomas Tanner MRN: [id number] Date of Birth: 1951/08/14  Transition of [facility]) CM/SW Contact:    Marina Goodell Phone Number: [phone] 05/24/2021, 12:52 PM  Clinical Narrative:                  Patient presents from [facility] SNF/LTC due to constipation.  Patient has dx of Fournier's gangrene of scrotum and Functional quadriplegia.  Patient consented to irrigation and debridement of abscess performed on 05/21/2021. Patient was hypotensive on 05/23/2021  and remain in the ICU.  Plan for unit transfer today. PT/OT have evaluated patient and they recommend SNF placement. CSW contacted Delorise Shiner w/ [facility] [phone], who stated the patient can return to the facility pending insurance authorization.  CSW called and left voicemail for patient's spouse Redding Cloe [phone] to discuss d/c plans.  Expected Discharge Plan: Long Term Nursing Home [facility] LTC) Barriers to Discharge: Continued Medical Work up, SNF Pending discharge summary, SNF Pending bed offer   Patient Goals and CMS Choice Patient states their goals for this hospitalization and ongoing recovery are:: To return to facility CMS Medicare.gov Compare Post Acute Care list provided to:: Other (Comment Required) (Hochberg,[name] (Spouse)   [phone] (Home Phone) Choice offered to / list presented to : Spouse (Georgi,[name] (Spouse)   [phone] (Home Phone)  Expected Discharge Plan and Services Expected Discharge Plan: Long Term Nursing Home [facility] LTC) In-house Referral: Clinical Social Work   Post Acute Care Choice: Nursing Home [facility] LTC) Living arrangements for the past 2 months: Skilled Nursing Facility (Long term care facility)                                      Prior Living Arrangements/Services Living arrangements for the past 2 months: Skilled Nursing Facility  (Long term care facility) Lives with:: Facility Resident Patient language and need for interpreter reviewed:: Yes Do you feel safe going back to the place where you live?: Yes      Need for Family Participation in Patient Care: Yes (Comment) Care giver support system in place?: Yes (comment)   Criminal Activity/Legal Involvement Pertinent to Current Situation/Hospitalization: No - Comment as needed  Activities of Daily Living      Permission Sought/Granted Permission sought to share information with : Facility Industrial/product designer granted to share information with : Yes, Verbal Permission Granted  Share Information with NAME: Maslowski,[name] (Spouse)   [phone] (Home Phone  Permission granted to share info w AGENCY: [facility]        Emotional Assessment Appearance:: Appears stated age Attitude/Demeanor/Rapport: Unable to Assess Affect (typically observed): Unable to Assess Orientation: : Oriented to Self, Oriented to Place, Oriented to  Time, Oriented to Situation Alcohol / Substance Use: Not Applicable Psych Involvement: No (comment)  Admission diagnosis:  Abscess [L02.91] Fournier's gangrene [N49.3] Scrotal pain [N50.82] Constipation, unspecified constipation type [K59.00] Fournier gangrene [N49.3] Patient Active Problem List   Diagnosis Date Noted   Abscess 05/18/2021   Fournier's gangrene of scrotum 05/18/2021   Fournier gangrene 05/18/2021   Sepsis (HCC) 05/18/2021   Functional quadriplegia (HCC) 05/18/2021   Bacteremia due to Gram-negative bacteria 05/07/2021   Constipated 05/06/2021   Physical deconditioning 05/06/2021   Atherosclerosis of aorta (HCC) 09/28/2020   Acute on chronic heart failure (HCC) 08/02/2020   Acute saddle  pulmonary embolism (HCC) 08/02/2020   Hypoxia 08/01/2020   Weakness 08/01/2020   Abnormality of gait and mobility 08/01/2020   AKI (acute kidney injury) (HCC) 07/21/2020   Hypotension 07/20/2020    Leukocytosis 07/20/2020   Cellulitis of right lower extremity 07/20/2020   Pressure injury of skin 07/12/2020   Acute respiratory failure with hypoxia (HCC) 07/10/2020   COPD with acute exacerbation (HCC)    Elevated troponin    Chronic diastolic CHF (congestive heart failure) (HCC) 05/24/2020   Anasarca    Acute renal failure superimposed on stage 3a chronic kidney disease (HCC)    Hyperlipidemia    Tobacco abuse    Shortness of breath 04/28/2020   Upper respiratory tract infection due to COVID-19 virus 04/28/2020   Edema 04/28/2020   Encounter for general adult medical examination with abnormal findings 01/15/2019   Acute non-recurrent maxillary sinusitis 01/15/2019   Gastroesophageal reflux disease without esophagitis 01/15/2019   CAD in native artery 12/16/2018   Morbid obesity with BMI of 50.0-59.9, adult (HCC) 12/16/2018   Cigarette smoker motivated to quit 12/16/2018   Chronic pain of right knee 08/04/2018   Mild intermittent asthma 08/04/2018   Impaired fasting glucose 04/03/2018   Low back pain with right-sided sciatica 03/05/2018   Chronic right-sided low back pain with right-sided sciatica 03/05/2018   Pain in right hip 03/05/2018   Hypertension 03/05/2018   Foreign body in bladder and urethra 06/09/2013   Kidney stone 06/09/2013   Ureteric stone 06/09/2013   Kidney stone 06/09/2013   PCP:  Theotis Burrow, NP (Inactive) Pharmacy:   [facility] [address]), [place] - [address] Jerilynn M[place]) [place] [address] Phone: [phone] Fax: [phone]  [facility]  [facility]) - [place], [place] - [address] [place], [address] [place], [address] [place] [address] Phone: [phone] Fax: [phone]     Social Determinants of Health (SDOH) Interventions    Readmission Risk Interventions No flowsheet data found.

## 2021-05-24 NOTE — Progress Notes (Signed)
Urology Inpatient Progress Note  Subjective: No acute events overnight.  He is afebrile, VSS. Abscess cultures finalized with multiple organisms including abundant Bacteroides ovatus and moderate Bacteroides thetaiotamicron.  On antibiotics as below. Foley catheter in place draining clear, yellow urine. No acute concerns today.  He states he does not wish to discharge to a SNF or rehab, stating he wants to be home with his family.  Anti-infectives: Anti-infectives (From admission, onward)    Start     Dose/Rate Route Frequency Ordered Stop   05/23/21 0200  Ampicillin-Sulbactam (UNASYN) 3 g in sodium chloride 0.9 % 100 mL IVPB        3 g 200 mL/hr over 30 Minutes Intravenous Every 6 hours 05/22/21 1711     05/20/21 1000  piperacillin-tazobactam (ZOSYN) IVPB 4.5 g        4.5 g 25 mL/hr over 240 Minutes Intravenous Every 8 hours 05/20/21 0820 05/22/21 1938   05/19/21 0915  piperacillin-tazobactam (ZOSYN) 4.5 g in dextrose 5 % 100 mL IVPB  Status:  Discontinued        4.5 g 200 mL/hr over 30 Minutes Intravenous Every 8 hours 05/19/21 0646 05/19/21 0652   05/19/21 0915  piperacillin-tazobactam (ZOSYN) IVPB 4.5 g  Status:  Discontinued        4.5 g 200 mL/hr over 30 Minutes Intravenous Every 8 hours 05/19/21 0652 05/20/21 0820   05/19/21 0600  ampicillin (OMNIPEN) 2 g in sodium chloride 0.9 % 100 mL IVPB  Status:  Discontinued        2 g 300 mL/hr over 20 Minutes Intravenous Every 6 hours 05/18/21 2140 05/18/21 2150   05/19/21 0115  piperacillin-tazobactam (ZOSYN) IVPB 4.5 g  Status:  Discontinued        4.5 g 200 mL/hr over 30 Minutes Intravenous Every 8 hours 05/18/21 2150 05/19/21 0646   05/18/21 2200  linezolid (ZYVOX) IVPB 600 mg  Status:  Discontinued        600 mg 300 mL/hr over 60 Minutes Intravenous Every 12 hours 05/18/21 2151 05/22/21 0755   05/18/21 2000  ampicillin (OMNIPEN) 2 g in sodium chloride 0.9 % 100 mL IVPB        2 g 300 mL/hr over 20 Minutes Intravenous  Once  05/18/21 1951 05/18/21 2141   05/18/21 2000  gentamicin (GARAMYCIN) 250 mg in dextrose 5 % 50 mL IVPB        2 mg/kg  124.3 kg (Adjusted) 112.5 mL/hr over 30 Minutes Intravenous  Once 05/18/21 1951 05/18/21 2124       Current Facility-Administered Medications  Medication Dose Route Frequency Provider Last Rate Last Admin   0.9 %  sodium chloride infusion  250 mL Intravenous Continuous Stoioff, Scott C, MD       0.9 %  sodium chloride infusion  250 mL Intravenous Continuous Stoioff, Scott C, MD       acetaminophen (TYLENOL) tablet 650 mg  650 mg Oral Q6H PRN Stoioff, Scott C, MD   650 mg at 05/23/21 0950   Or   acetaminophen (TYLENOL) suppository 650 mg  650 mg Rectal Q6H PRN Stoioff, Scott C, MD       albuterol (PROVENTIL) (2.5 MG/3ML) 0.083% nebulizer solution 2.5 mg  2.5 mg Nebulization Q2H PRN Stoioff, Scott C, MD       Ampicillin-Sulbactam (UNASYN) 3 g in sodium chloride 0.9 % 100 mL IVPB  3 g Intravenous Q6H Ravishankar, Rhodia Albright, MD 200 mL/hr at 05/24/21 1151 3 g at 05/24/21 1151  Chlorhexidine Gluconate Cloth 2 % PADS 6 each  6 each Topical Daily Stoioff, Scott C, MD   6 each at 05/24/21 1004   fentaNYL (SUBLIMAZE) injection 50-100 mcg  50-100 mcg Intravenous Q4H PRN Stoioff, Scott C, MD   100 mcg at 05/24/21 1152   insulin aspart (novoLOG) injection 0-9 Units  0-9 Units Subcutaneous Q4H Stoioff, Scott C, MD   1 Units at 05/24/21 1152   ipratropium-albuterol (DUONEB) 0.5-2.5 (3) MG/3ML nebulizer solution 3 mL  3 mL Nebulization BID Stoioff, Scott C, MD   3 mL at 05/24/21 0812   lactulose (CHRONULAC) 10 GM/15ML solution 20 g  20 g Oral Once Marrion Coy, MD       midodrine (PROAMATINE) tablet 10 mg  10 mg Oral TID WC Stoioff, Scott C, MD   10 mg at 05/24/21 1152   nicotine (NICODERM CQ - dosed in mg/24 hours) patch 21 mg  21 mg Transdermal Daily Stoioff, Scott C, MD   21 mg at 05/24/21 1004   pantoprazole (PROTONIX) EC tablet 40 mg  40 mg Oral QHS Dorothea Ogle B, RPH   40 mg at  05/23/21 2211   polyethylene glycol (MIRALAX / GLYCOLAX) packet 17 g  17 g Oral Daily PRN Stoioff, Lorin Picket C, MD   17 g at 05/23/21 0948   rosuvastatin (CRESTOR) tablet 20 mg  20 mg Oral Daily Stoioff, Scott C, MD   20 mg at 05/24/21 1003   senna (SENOKOT) tablet 8.6 mg  1 tablet Oral BID Stoioff, Scott C, MD   8.6 mg at 05/24/21 1003   sodium hypochlorite (DAKIN'S 1/2 STRENGTH) topical solution   Irrigation UD Stoioff, Scott C, MD       Objective: Vital signs in last 24 hours: Temp:  [97.9 F (36.6 C)] 97.9 F (36.6 C) (06/15 0800) Pulse Rate:  [78-96] 88 (06/15 1000) Resp:  [12-21] 17 (06/15 1000) BP: (91-144)/(43-98) 129/87 (06/15 1000) SpO2:  [91 %-100 %] 96 % (06/15 1000)  Intake/Output from previous day: 06/14 0701 - 06/15 0700 In: 2785.6 [P.O.:840; I.V.:1491; IV Piggyback:454.6] Out: 3175 [Urine:3175] Intake/Output this shift: Total I/O In: -  Out: 900 [Urine:900]  Physical Exam Vitals and nursing note reviewed.  Constitutional:      General: He is not in acute distress.    Appearance: He is not ill-appearing, toxic-appearing or diaphoretic.  HENT:     Head: Normocephalic and atraumatic.  Pulmonary:     Effort: Pulmonary effort is normal. No respiratory distress.  Genitourinary:    Comments: Healthy appearing granulation tissue noted throughout the base of the wound.  Previously noted superficial area of gray eschar was not noted today, likely debrided with packing removal.  No new areas of erythema, purulence, crepitus, or fluctuance. Skin:    General: Skin is warm and dry.  Neurological:     Mental Status: He is alert and oriented to person, place, and time.  Psychiatric:        Mood and Affect: Mood normal.        Behavior: Behavior normal.   Lab Results:  Recent Labs    05/22/21 0411  WBC 5.3  HGB 7.8*  HCT 24.7*  PLT 313   BMET Recent Labs    05/23/21 0601 05/24/21 0431  NA 136 136  K 4.1 4.0  CL 98 97*  CO2 33* 31  GLUCOSE 90 107*  BUN 10 9   CREATININE 1.24 1.19  CALCIUM 8.1* 8.0*   Assessment & Plan: 70 year old male admitted  with Fournier's gangrene, on broad-spectrum antibiotics with polymicrobial growth on wound culture.  Michiel Cowboy and I completed his daily wet-to-dry dressing change at the bedside today.  Wound is healthy appearing along the base with no evidence of advancing erythema or necrosis.  Several photos taken of the wound today, see media tab.  Dr. Richardo Hanks to work with plastics regarding possible closure.  Patient tolerated well.  Plan: -Continue IV antibiotics -Continue daily wet-to-dry dressing changes with premedication -Dr. Richardo Hanks to consult with plastics regarding closure  Carman Ching, PA-C 05/24/2021

## 2021-05-25 DIAGNOSIS — A419 Sepsis, unspecified organism: Secondary | ICD-10-CM | POA: Diagnosis not present

## 2021-05-25 DIAGNOSIS — I5032 Chronic diastolic (congestive) heart failure: Secondary | ICD-10-CM | POA: Diagnosis not present

## 2021-05-25 DIAGNOSIS — R6520 Severe sepsis without septic shock: Secondary | ICD-10-CM | POA: Diagnosis not present

## 2021-05-25 DIAGNOSIS — N493 Fournier gangrene: Secondary | ICD-10-CM | POA: Diagnosis not present

## 2021-05-25 LAB — CBC WITH DIFFERENTIAL/PLATELET
Abs Immature Granulocytes: 0.02 10*3/uL (ref 0.00–0.07)
Basophils Absolute: 0 10*3/uL (ref 0.0–0.1)
Basophils Relative: 1 %
Eosinophils Absolute: 0.2 10*3/uL (ref 0.0–0.5)
Eosinophils Relative: 3 %
HCT: 27.1 % — ABNORMAL LOW (ref 39.0–52.0)
Hemoglobin: 8.7 g/dL — ABNORMAL LOW (ref 13.0–17.0)
Immature Granulocytes: 0 %
Lymphocytes Relative: 22 %
Lymphs Abs: 1.2 10*3/uL (ref 0.7–4.0)
MCH: 29.9 pg (ref 26.0–34.0)
MCHC: 32.1 g/dL (ref 30.0–36.0)
MCV: 93.1 fL (ref 80.0–100.0)
Monocytes Absolute: 0.5 10*3/uL (ref 0.1–1.0)
Monocytes Relative: 9 %
Neutro Abs: 3.6 10*3/uL (ref 1.7–7.7)
Neutrophils Relative %: 65 %
Platelets: 344 10*3/uL (ref 150–400)
RBC: 2.91 MIL/uL — ABNORMAL LOW (ref 4.22–5.81)
RDW: 15.1 % (ref 11.5–15.5)
WBC: 5.5 10*3/uL (ref 4.0–10.5)
nRBC: 0 % (ref 0.0–0.2)

## 2021-05-25 LAB — BASIC METABOLIC PANEL
Anion gap: 6 (ref 5–15)
BUN: 7 mg/dL — ABNORMAL LOW (ref 8–23)
CO2: 32 mmol/L (ref 22–32)
Calcium: 8.6 mg/dL — ABNORMAL LOW (ref 8.9–10.3)
Chloride: 98 mmol/L (ref 98–111)
Creatinine, Ser: 1.03 mg/dL (ref 0.61–1.24)
GFR, Estimated: >60 mL/min (ref >60)
Glucose, Bld: 119 mg/dL — ABNORMAL HIGH (ref 70–99)
Potassium: 3.9 mmol/L (ref 3.5–5.1)
Sodium: 136 mmol/L (ref 135–145)

## 2021-05-25 LAB — GLUCOSE, CAPILLARY
Glucose-Capillary: 127 mg/dL — ABNORMAL HIGH (ref 70–99)
Glucose-Capillary: 123 mg/dL — ABNORMAL HIGH (ref 70–99)
Glucose-Capillary: 120 mg/dL — ABNORMAL HIGH (ref 70–99)

## 2021-05-25 LAB — MAGNESIUM: Magnesium: 1.9 mg/dL (ref 1.7–2.4)

## 2021-05-25 LAB — IRON AND TIBC
Iron: 34 ug/dL — ABNORMAL LOW (ref 45–182)
Saturation Ratios: 11 % — ABNORMAL LOW (ref 17.9–39.5)
TIBC: 309 ug/dL (ref 250–450)
UIBC: 275 ug/dL

## 2021-05-25 LAB — PHOSPHORUS: Phosphorus: 2.9 mg/dL (ref 2.5–4.6)

## 2021-05-25 LAB — VITAMIN B12: Vitamin B-12: 264 pg/mL (ref 180–914)

## 2021-05-25 MED ORDER — APIXABAN 5 MG PO TABS
5.0000 mg | ORAL_TABLET | Freq: Two times a day (BID) | ORAL | Status: DC
  Administered 2021-05-25 – 2021-05-27 (×4): 5 mg via ORAL
  Filled 2021-05-25 (×4): qty 1

## 2021-05-25 MED ORDER — LACTULOSE 10 GM/15ML PO SOLN: 20.0000 g | Freq: Once | ORAL | Status: DC

## 2021-05-25 NOTE — TOC Progression Note (Signed)
Transition of Care [facility]) - Progression Note    Patient Details  Name: Thomas Tanner MRN: [id number] Date of Birth: [birth year]  Transition of Care [facility]) CM/SW [place], LCSW Phone Number: 05/25/2021, 1:18 PM  Clinical Narrative:   CSW met with patient. He is not interested in SNF placement at all and prefers to return home with home health. Patient said he wants someone to come out daily for his dressing changes but CSW explained home health agencies only come out 2-3 times per week. He feels like his wife and children may be able to do dressing changes on days that home health agency does not come out. Explained frequent difficulty in finding home health for patients with [facility] Medicare. If we are unable to find a home health agency to accept him, he still does not want to go to SNF. Team is aware. Left voicemail for wife.  Expected Discharge Plan: [facility] LTC) Barriers to Discharge: Continued Medical Work up, SNF Pending discharge summary, SNF Pending bed offer  Expected Discharge Plan and Services Expected Discharge Plan: [place] ([place]) In-house Referral: Clinical Social Work   Post Acute Care Choice: [place] ([place]) Living arrangements for the past 2 months: [place] (Long term care facility)                                       Social Determinants of Health (SDOH) Interventions    Readmission Risk Interventions No flowsheet data found.

## 2021-05-25 NOTE — Care Management Important Message (Signed)
Important Message  Patient Details  Name: Thomas Tanner MRN: [id number] Date of Birth: 01-28-1951   Medicare Important Message Given:  Yes     [name] 05/25/2021, 1:27 PM

## 2021-05-25 NOTE — Progress Notes (Signed)
Urology Inpatient Progress Note  Subjective: Patient transition to floor status overnight.  No acute events.  He is afebrile, VSS. No new reports of pain. Remains adamantly opposed to discharge to a rehab facility and prefers to discharge home with home health care.  Anti-infectives: Anti-infectives (From admission, onward)    Start     Dose/Rate Route Frequency Ordered Stop   05/23/21 0200  Ampicillin-Sulbactam (UNASYN) 3 g in sodium chloride 0.9 % 100 mL IVPB        3 g 200 mL/hr over 30 Minutes Intravenous Every 6 hours 05/22/21 1711     05/20/21 1000  piperacillin-tazobactam (ZOSYN) IVPB 4.5 g        4.5 g 25 mL/hr over 240 Minutes Intravenous Every 8 hours 05/20/21 0820 05/22/21 1938   05/19/21 0915  piperacillin-tazobactam (ZOSYN) 4.5 g in dextrose 5 % 100 mL IVPB  Status:  Discontinued        4.5 g 200 mL/hr over 30 Minutes Intravenous Every 8 hours 05/19/21 0646 05/19/21 0652   05/19/21 0915  piperacillin-tazobactam (ZOSYN) IVPB 4.5 g  Status:  Discontinued        4.5 g 200 mL/hr over 30 Minutes Intravenous Every 8 hours 05/19/21 0652 05/20/21 0820   05/19/21 0600  ampicillin (OMNIPEN) 2 g in sodium chloride 0.9 % 100 mL IVPB  Status:  Discontinued        2 g 300 mL/hr over 20 Minutes Intravenous Every 6 hours 05/18/21 2140 05/18/21 2150   05/19/21 0115  piperacillin-tazobactam (ZOSYN) IVPB 4.5 g  Status:  Discontinued        4.5 g 200 mL/hr over 30 Minutes Intravenous Every 8 hours 05/18/21 2150 05/19/21 0646   05/18/21 2200  linezolid (ZYVOX) IVPB 600 mg  Status:  Discontinued        600 mg 300 mL/hr over 60 Minutes Intravenous Every 12 hours 05/18/21 2151 05/22/21 0755   05/18/21 2000  ampicillin (OMNIPEN) 2 g in sodium chloride 0.9 % 100 mL IVPB        2 g 300 mL/hr over 20 Minutes Intravenous  Once 05/18/21 1951 05/18/21 2141   05/18/21 2000  gentamicin (GARAMYCIN) 250 mg in dextrose 5 % 50 mL IVPB        2 mg/kg  124.3 kg (Adjusted) 112.5 mL/hr over 30 Minutes  Intravenous  Once 05/18/21 1951 05/18/21 2124       Current Facility-Administered Medications  Medication Dose Route Frequency Provider Last Rate Last Admin   0.9 %  sodium chloride infusion  250 mL Intravenous Continuous Stoioff, Scott C, MD       0.9 %  sodium chloride infusion  250 mL Intravenous Continuous Stoioff, Scott C, MD       acetaminophen (TYLENOL) tablet 650 mg  650 mg Oral Q6H PRN Stoioff, Scott C, MD   650 mg at 05/23/21 0950   Or   acetaminophen (TYLENOL) suppository 650 mg  650 mg Rectal Q6H PRN Stoioff, Scott C, MD       albuterol (PROVENTIL) (2.5 MG/3ML) 0.083% nebulizer solution 2.5 mg  2.5 mg Nebulization Q2H PRN Stoioff, Scott C, MD       Ampicillin-Sulbactam (UNASYN) 3 g in sodium chloride 0.9 % 100 mL IVPB  3 g Intravenous Q6H Ravishankar, Rhodia Albright, MD 200 mL/hr at 05/25/21 0610 3 g at 05/25/21 0610   Chlorhexidine Gluconate Cloth 2 % PADS 6 each  6 each Topical Daily Riki Altes, MD   6 each at 05/24/21 1004  fentaNYL (SUBLIMAZE) injection 50-100 mcg  50-100 mcg Intravenous Q4H PRN Irineo Axon C, MD   50 mcg at 05/25/21 1137   insulin aspart (novoLOG) injection 0-9 Units  0-9 Units Subcutaneous TID [facility] Mansy, Jan A, MD   1 Units at 05/25/21 0801   midodrine (PROAMATINE) tablet 10 mg  10 mg Oral TID WC Stoioff, Scott C, MD   10 mg at 05/25/21 0800   nicotine (NICODERM CQ - dosed in mg/24 hours) patch 21 mg  21 mg Transdermal Daily Stoioff, Scott C, MD   21 mg at 05/25/21 0922   pantoprazole (PROTONIX) EC tablet 40 mg  40 mg Oral QHS Tressie Ellis, RPH   40 mg at 05/24/21 2118   polyethylene glycol (MIRALAX / GLYCOLAX) packet 17 g  17 g Oral Daily PRN Irineo Axon C, MD   17 g at 05/23/21 0948   rosuvastatin (CRESTOR) tablet 20 mg  20 mg Oral Daily Stoioff, Scott C, MD   20 mg at 05/25/21 9622   senna (SENOKOT) tablet 8.6 mg  1 tablet Oral BID Irineo Axon C, MD   8.6 mg at 05/25/21 2979   sodium hypochlorite (DAKIN'S 1/2 STRENGTH) topical solution    Irrigation UD Stoioff, Scott C, MD       Objective: Vital signs in last 24 hours: Temp:  [97.4 F (36.3 C)-98.5 F (36.9 C)] 97.4 F (36.3 C) (06/16 1121) Pulse Rate:  [79-89] 86 (06/16 1121) Resp:  [16-20] 20 (06/16 1121) BP: (111-155)/(47-80) 131/55 (06/16 1121) SpO2:  [97 %-100 %] 98 % (06/16 1121)  Intake/Output from previous day: 06/15 0701 - 06/16 0700 In: 360 [P.O.:360] Out: 3200 [Urine:3200] Intake/Output this shift: Total I/O In: 240 [P.O.:240] Out: 400 [Urine:400]  Physical Exam Vitals and nursing note reviewed.  Constitutional:      General: He is not in acute distress.    Appearance: He is not ill-appearing, toxic-appearing or diaphoretic.  HENT:     Head: Normocephalic and atraumatic.  Pulmonary:     Effort: Pulmonary effort is normal. No respiratory distress.  Genitourinary:    Comments: Wound bed visualized with healthy appearing granulation tissue throughout.  No evidence of necrosis, purulence, advancing erythema, or fluctuance.  Left testicle remains viable appearing. Skin:    General: Skin is warm and dry.  Neurological:     Mental Status: He is alert and oriented to person, place, and time.  Psychiatric:        Mood and Affect: Mood normal.        Behavior: Behavior normal.   Lab Results:  Recent Labs    05/25/21 0523  WBC 5.5  HGB 8.7*  HCT 27.1*  PLT 344   BMET Recent Labs    05/24/21 0431 05/25/21 0523  NA 136 136  K 4.0 3.9  CL 97* 98  CO2 31 32  GLUCOSE 107* 119*  BUN 9 7*  CREATININE 1.19 1.03  CALCIUM 8.0* 8.6*   Assessment & Plan: 70 year old male admitted with Fournier's gangrene, on broad-spectrum antibiotics with polymicrobial growth on wound culture.  Michiel Cowboy and I completed his daily wet-to-dry dressing change at the bedside today with assistance from Sherilyn Banker, [name].  Nursing oriented to his wound and needs for daily dressing changes moving forward.  Patient tolerated well, but continues to require  premedication given the extent of his wound.  Dr. Richardo Hanks has been working with Dr. Merry Proud from Plastic Surgery regarding tertiary wound closure.  Dr. Arita Miss will plan  to see the patient in outpatient follow-up in clinic in approximately 2 weeks.  Okay to plan for ultimate discharge home with home health.  He will require daily visits for wet-to-dry dressing changes upon discharge.  Plan: -Continue IV antibiotics for total of 14 days -Nursing to take over daily wet-to-dry dressing changes starting tomorrow, continue to plan for premedication -Dr. Arita Miss to arrange outpatient follow-up in 2 weeks -Ultimately okay to discharge home with home health in place to perform daily dressing changes  Carman Ching, PA-C 05/25/2021

## 2021-05-25 NOTE — NC FL2 (Signed)
[place] MEDICAID FL2 LEVEL OF CARE SCREENING TOOL     IDENTIFICATION  Patient Name: Thomas Tanner Birthdate: 1951-03-19 Sex: male Admission Date (Current Location): 05/18/2021  [facility] and [place] Number:  Chiropodist and Address:  [facility], [address] [place], [place] [address]      Provider Number: [id number]  Attending Physician Name and Address:  Marrion Coy, MD  Relative Name and Phone Number:      Current Level of Care: Hospital Recommended Level of Care: Skilled Nursing Facility Prior Approval Number:    Date Approved/Denied:   PASRR Number: [id number]  Discharge Plan: SNF    Current Diagnoses: Patient Active Problem List   Diagnosis Date Noted   Abscess 05/18/2021   Fournier's gangrene of scrotum 05/18/2021   Fournier gangrene 05/18/2021   Sepsis (HCC) 05/18/2021   Functional quadriplegia (HCC) 05/18/2021   Bacteremia due to Gram-negative bacteria 05/07/2021   Constipated 05/06/2021   Physical deconditioning 05/06/2021   Atherosclerosis of aorta (HCC) 09/28/2020   Acute on chronic heart failure (HCC) 08/02/2020   Acute saddle pulmonary embolism (HCC) 08/02/2020   Hypoxia 08/01/2020   Weakness 08/01/2020   Abnormality of gait and mobility 08/01/2020   AKI (acute kidney injury) (HCC) 07/21/2020   Hypotension 07/20/2020   Leukocytosis 07/20/2020   Cellulitis of right lower extremity 07/20/2020   Pressure injury of skin 07/12/2020   Acute respiratory failure with hypoxia (HCC) 07/10/2020   COPD with acute exacerbation (HCC)    Elevated troponin    Chronic diastolic CHF (congestive heart failure) (HCC) 05/24/2020   Anasarca    Acute renal failure superimposed on stage 3a chronic kidney disease (HCC)    Hyperlipidemia    Tobacco abuse    Shortness of breath 04/28/2020   Upper respiratory tract infection due to COVID-19 virus 04/28/2020   Edema 04/28/2020   Encounter for general adult medical  examination with abnormal findings 01/15/2019   Acute non-recurrent maxillary sinusitis 01/15/2019   Gastroesophageal reflux disease without esophagitis 01/15/2019   CAD in native artery 12/16/2018   Morbid obesity with BMI of 50.0-59.9, adult (HCC) 12/16/2018   Cigarette smoker motivated to quit 12/16/2018   Chronic pain of right knee 08/04/2018   Mild intermittent asthma 08/04/2018   Impaired fasting glucose 04/03/2018   Low back pain with right-sided sciatica 03/05/2018   Chronic right-sided low back pain with right-sided sciatica 03/05/2018   Pain in right hip 03/05/2018   Hypertension 03/05/2018   Foreign body in bladder and urethra 06/09/2013   Kidney stone 06/09/2013   Ureteric stone 06/09/2013   Kidney stone 06/09/2013    Orientation RESPIRATION BLADDER Height & Weight     Self, Time, Situation, Place  O2 (Nasal Canula 2 L) Continent, Indwelling catheter Weight: (!) 421 lb (191 kg) Height:  6\' 1"  (185.4 cm)  BEHAVIORAL SYMPTOMS/MOOD NEUROLOGICAL BOWEL NUTRITION STATUS   (None)  (None) Continent Diet (Heart healthy)  AMBULATORY STATUS COMMUNICATION OF NEEDS Skin   Extensive Assist Verbally Bruising, Other (Comment), Surgical wounds (Contact dermatitis, Cracking, [place]. Incision on perineum (gauze).)                       Personal Care Assistance Level of Assistance  Bathing, Feeding, Dressing Bathing Assistance: Maximum assistance Feeding assistance: Limited assistance Dressing Assistance: Maximum assistance   Functional Limitations Info  Sight, Hearing, Speech Sight Info: Adequate Hearing Info: Adequate Speech Info: Adequate    SPECIAL CARE FACTORS FREQUENCY  PT (By  licensed PT), OT (By licensed OT)     PT Frequency: 5 x week OT Frequency: 5 x week            Contractures Contractures Info: Not present    Additional Factors Info  Code Status, Allergies Code Status Info: Full code Allergies Info: Morphine and related           Current  Medications (05/25/2021):  This is the current hospital active medication list Current Facility-Administered Medications  Medication Dose Route Frequency Provider Last Rate Last Admin   0.9 %  sodium chloride infusion  250 mL Intravenous Continuous Stoioff, Scott C, MD       0.9 %  sodium chloride infusion  250 mL Intravenous Continuous Stoioff, Scott C, MD       acetaminophen (TYLENOL) tablet 650 mg  650 mg Oral Q6H PRN Stoioff, Scott C, MD   650 mg at 05/23/21 0950   Or   acetaminophen (TYLENOL) suppository 650 mg  650 mg Rectal Q6H PRN Stoioff, Scott C, MD       albuterol (PROVENTIL) (2.5 MG/3ML) 0.083% nebulizer solution 2.5 mg  2.5 mg Nebulization Q2H PRN Stoioff, Scott C, MD       Ampicillin-Sulbactam (UNASYN) 3 g in sodium chloride 0.9 % 100 mL IVPB  3 g Intravenous Q6H Ravishankar, Rhodia Albright, MD 200 mL/hr at 05/25/21 0610 3 g at 05/25/21 0610   Chlorhexidine Gluconate Cloth 2 % PADS 6 each  6 each Topical Daily Stoioff, Scott C, MD   6 each at 05/24/21 1004   fentaNYL (SUBLIMAZE) injection 50-100 mcg  50-100 mcg Intravenous Q4H PRN Stoioff, Lorin Picket C, MD   50 mcg at 05/25/21 0801   insulin aspart (novoLOG) injection 0-9 Units  0-9 Units Subcutaneous TID [facility] Mansy, Jan A, MD   1 Units at 05/25/21 0801   midodrine (PROAMATINE) tablet 10 mg  10 mg Oral TID WC Stoioff, Scott C, MD   10 mg at 05/25/21 0800   nicotine (NICODERM CQ - dosed in mg/24 hours) patch 21 mg  21 mg Transdermal Daily Stoioff, Scott C, MD   21 mg at 05/25/21 0922   pantoprazole (PROTONIX) EC tablet 40 mg  40 mg Oral QHS Dorothea Ogle B, RPH   40 mg at 05/24/21 2118   polyethylene glycol (MIRALAX / GLYCOLAX) packet 17 g  17 g Oral Daily PRN Irineo Axon C, MD   17 g at 05/23/21 0948   rosuvastatin (CRESTOR) tablet 20 mg  20 mg Oral Daily Stoioff, Scott C, MD   20 mg at 05/25/21 4174   senna (SENOKOT) tablet 8.6 mg  1 tablet Oral BID Irineo Axon C, MD   8.6 mg at 05/25/21 0814   sodium hypochlorite (DAKIN'S 1/2  STRENGTH) topical solution   Irrigation UD Stoioff, Verna Czech, MD         Discharge Medications: Please see discharge summary for a list of discharge medications.  Relevant Imaging Results:  Relevant Lab Results:   Additional Information SS#: [id number]. Not vaccinated. Went to [facility] 6/7-6/9.  Margarito Liner, LCSW

## 2021-05-25 NOTE — Progress Notes (Signed)
   Date of Admission:  05/18/2021    ID: Thomas Tanner is a 70 y.o. male with   Active Problems:   Hypertension   CAD in native artery   Morbid obesity with BMI of 50.0-59.9, adult (HCC)   Gastroesophageal reflux disease without esophagitis   Chronic diastolic CHF (congestive heart failure) (HCC)   Hyperlipidemia   Constipated   Abscess   Fournier's gangrene of scrotum   Fournier gangrene   Sepsis (HCC)   Functional quadriplegia (HCC)    Subjective: Doing well Out of ICU   Medications:   apixaban  5 mg Oral BID   Chlorhexidine Gluconate Cloth  6 each Topical Daily   insulin aspart  0-9 Units Subcutaneous TID AC & HS   lactulose  20 g Oral Once   midodrine  10 mg Oral TID WC   nicotine  21 mg Transdermal Daily   pantoprazole  40 mg Oral QHS   rosuvastatin  20 mg Oral Daily   senna  1 tablet Oral BID   sodium hypochlorite   Irrigation UD    Objective: Vital signs in last 24 hours: Temp:  [97.4 F (36.3 C)-98.1 F (36.7 C)] 97.4 F (36.3 C) (06/16 1121) Pulse Rate:  [80-89] 87 (06/16 1706) Resp:  [16-20] 20 (06/16 1706) BP: (111-155)/(47-80) 133/60 (06/16 1706) SpO2:  [97 %-99 %] 98 % (06/16 1706)  PHYSICAL EXAM:  General: Alert, cooperative, no distress, appears stated age.  Lungs: Clear to auscultation bilaterally. No Wheezing or Rhonchi. No rales. Heart: Regular rate and rhythm, no murmur, rub or gallop. Abdomen: Soft, non-tender,not distended. Bowel sounds normal. No masses Surgical dressing scrotum Extremities: atraumatic, no cyanosis. No edema. No clubbing Skin: No rashes or lesions. Or bruising Lymph: Cervical, supraclavicular normal. Neurologic: Grossly non-focal  Lab Results Recent Labs    05/24/21 0431 05/25/21 0523  WBC  --  5.5  HGB  --  8.7*  HCT  --  27.1*  NA 136 136  K 4.0 3.9  CL 97* 98  CO2 31 32  BUN 9 7*  CREATININE 1.19 1.03   Assessment/Plan: Fournier's gangrene.  Status post I&D and debridement x3. Was on Zosyn  and linezolid.  Cultures had Bacteroides and hence was switched over to Unasyn.  Will need  at least  2-3 weeksOf antibiotics.   When ready to be discharged can be switched to PO Augmentin until 06/08/21  Recent Bacteroides bacteremia.  Repeat culture negative  Morbid obesity  CT showed b/l renal exophytic lesions- urology on board  ID will sign off- call if needed

## 2021-05-25 NOTE — Progress Notes (Signed)
PROGRESS NOTE    Thomas Tanner  FKC:[id number] DOB: 05-03-1951 DOA: 05/18/2021 PCP: Theotis Burrow, NP (Inactive)    Brief Narrative:   This is a 70 yo male who presented to [facility] ER on 06/9 with c/o constipation, abdominal pain, and scrotal pain.  However, scrotal ultrasound and CT Pelvis revealed extensive gas in the scrotum consistent with Fournier's Gangrene, and also noted upon examination to have extensive purulent drainage from the midline of the scrotum.  ER physician consulted Urology and pt transported emergently to the OR.  He required cystoscopy; urethral dilation of urethral stricture; complex catheter placement; scrotal exploration and debridement of skin/subcutaneous tissue/muscle/ and fascia.  He was subsequently admitted to the stepdown unit per hospitalist team for additional workup and treatment.  On 06/10 he developed hypotension requiring peripheral levophed gtt. Patient was initially treated with Zosyn and Zyvox.  Culture from wound had multiple bacterial growth.  Changed to Unasyn after seen by ID.  Assessment & Plan:   Active Problems:   Hypertension   CAD in native artery   Morbid obesity with BMI of 50.0-59.9, adult (HCC)   Gastroesophageal reflux disease without esophagitis   Chronic diastolic CHF (congestive heart failure) (HCC)   Hyperlipidemia   Constipated   Abscess   Fournier's gangrene of scrotum   Fournier gangrene   Sepsis (HCC)   Functional quadriplegia (HCC)  #1.  Septic shock with severe sepsis secondary to Fournier's gangrene, necrotizing perineal infection. Fournier's gangrene, Perineal infection status postdebridement. Patient condition improving, continue antibiotics, will switch to oral Augmentin at discharge per recommendation from ID.  Last dose 6/30.  #2.  Acute kidney injury secondary to septic shock. Lactic acidosis secondary to septic shock. Hyponatremia. Condition had improved  3.  Type 2 diabetes per Continue sliding  scale insulin.  4.  COPD with chronic hypoxemic respiratory failure. Chronic diastolic congestive heart failure. Conditions are stable.  5.  Constipation. Give another dose of lactulose.  DVT prophylaxis: SCDs Code Status: full Family Communication:  Disposition Plan:    Status is: Inpatient  Remains inpatient appropriate because:Unsafe d/c plan  Dispo: The patient is from: SNF              Anticipated d/c is to:  pending decision, refused to go back to SNF              Patient currently is medically stable to d/c.   Difficult to place patient No        I/O last 3 completed shifts: In: 1875.6 [P.O.:960; I.V.:761; IV Piggyback:154.6] Out: 5400 [Urine:5400] Total I/O In: 240 [P.O.:240] Out: 400 [Urine:400]     Consultants:  Urology, ID  Procedures: I&D  Antimicrobials: Unasyn  Subjective: Patient feels well today, refused to go back to nursing home. Denies any short of breath or cough. No abdominal pain or nausea vomiting. No dysuria hematuria. No fever or chills.  Objective: Vitals:   05/25/21 0405 05/25/21 0731 05/25/21 0853 05/25/21 1121  BP: (!) 155/69 130/67 (!) 116/47 (!) 131/55  Pulse: 80 89 83 86  Resp: 16 20 20 20   Temp: (!) 97.5 F (36.4 C) 97.7 F (36.5 C) 98.1 F (36.7 C) (!) 97.4 F (36.3 C)  TempSrc: Oral Oral Oral Oral  SpO2: 99% 97% 98% 98%  Weight:      Height:        Intake/Output Summary (Last 24 hours) at 05/25/2021 1258 Last data filed at 05/25/2021 0945 Gross per 24 hour  Intake 600 ml  Output 2700 ml  Net -2100 ml   Filed Weights   05/18/21 1600  Weight: (!) 191 kg    Examination:  General exam: Appears calm and comfortable  Respiratory system: Clear to auscultation. Respiratory effort normal. Cardiovascular system: S1 & S2 heard, RRR. No JVD, murmurs, rubs, gallops or clicks. No pedal edema. Gastrointestinal system: Abdomen is nondistended, soft and nontender. No organomegaly or masses felt. Normal bowel sounds  heard. Central nervous system: Alert and oriented. No focal neurological deficits. Extremities: Symmetric Skin: No rashes, lesions or ulcers Psychiatry:  Mood & affect appropriate.     Data Reviewed: I have personally reviewed following labs and imaging studies  CBC: Recent Labs  Lab 05/18/21 1607 05/18/21 2210 05/19/21 0226 05/19/21 0956 05/19/21 2147 05/20/21 0551 05/21/21 0500 05/22/21 0411 05/25/21 0523  WBC 14.0*   < > 22.0*   < > 14.7* 13.4* 8.5 5.3 5.5  NEUTROABS 11.1*  --  19.5*  --   --   --   --   --  3.6  HGB 12.7*   < > 10.3*   < > 9.4* 9.2* 8.8* 7.8* 8.7*  HCT 37.9*   < > 31.0*   < > 29.0* 28.6* 24.4* 24.7* 27.1*  MCV 89.2   < > 95.7   < > 92.1 92.0 96.1 93.6 93.1  PLT 350   < > 350   < > 395 413* 412* 313 344   < > = values in this interval not displayed.   Basic Metabolic Panel: Recent Labs  Lab 05/21/21 0500 05/22/21 0411 05/23/21 0601 05/24/21 0431 05/25/21 0523  NA 133* 132* 136 136 136  K 4.0 4.6 4.1 4.0 3.9  CL 94* 96* 98 97* 98  CO2 33* 31 33* 31 32  GLUCOSE 135* 144* 90 107* 119*  BUN 12 10 10 9  7*  CREATININE 1.37* 1.37* 1.24 1.19 1.03  CALCIUM 7.8* 7.7* 8.1* 8.0* 8.6*  MG 2.0 2.2 2.2 2.0 1.9  PHOS 2.8 2.2* 3.2 2.7 2.9   GFR: Estimated Creatinine Clearance: 120.7 mL/min (by C-G formula based on SCr of 1.03 mg/dL). Liver Function Tests: Recent Labs  Lab 05/18/21 2210 05/19/21 0226  AST 23 20  ALT 30 28  ALKPHOS 79 68  BILITOT 1.6* 1.7*  PROT 6.7 5.9*  ALBUMIN 2.6* 2.4*   No results for input(s): LIPASE, AMYLASE in the last 168 hours. No results for input(s): AMMONIA in the last 168 hours. Coagulation Profile: No results for input(s): INR, PROTIME in the last 168 hours. Cardiac Enzymes: No results for input(s): CKTOTAL, CKMB, CKMBINDEX, TROPONINI in the last 168 hours. BNP (last 3 results) No results for input(s): PROBNP in the last 8760 hours. HbA1C: No results for input(s): HGBA1C in the last 72 hours. CBG: Recent Labs   Lab 05/24/21 1122 05/24/21 1538 05/24/21 2038 05/25/21 0729 05/25/21 1142  GLUCAP 140* 108* 115* 127* 123*   Lipid Profile: No results for input(s): CHOL, HDL, LDLCALC, TRIG, CHOLHDL, LDLDIRECT in the last 72 hours. Thyroid Function Tests: No results for input(s): TSH, T4TOTAL, FREET4, T3FREE, THYROIDAB in the last 72 hours. Anemia Panel: No results for input(s): VITAMINB12, FOLATE, FERRITIN, TIBC, IRON, RETICCTPCT in the last 72 hours. Sepsis Labs: Recent Labs  Lab 05/18/21 1607 05/18/21 2210 05/19/21 0226 05/19/21 0533  PROCALCITON  --  2.04  --   --   LATICACIDVEN 2.5*  --  3.9* 2.0*    Recent Results (from the past 240 hour(s))  Culture, blood (routine x 2)  Status: None   Collection Time: 05/18/21  8:23 PM   Specimen: BLOOD  Result Value Ref Range Status   Specimen Description BLOOD BLOOD LEFT FOREARM  Final   Special Requests   Final    BOTTLES DRAWN AEROBIC AND ANAEROBIC Blood Culture results may not be optimal due to an inadequate volume of blood received in culture bottles   Culture   Final    NO GROWTH 5 DAYS Performed at [facility], [address], [place], [place] [address]    Report Status 05/23/2021 FINAL  Final  Culture, blood (routine x 2)     Status: None   Collection Time: 05/18/21  8:23 PM   Specimen: BLOOD  Result Value Ref Range Status   Specimen Description BLOOD BLOOD RIGHT HAND  Final   Special Requests   Final    BOTTLES DRAWN AEROBIC AND ANAEROBIC Blood Culture adequate volume   Culture   Final    NO GROWTH 5 DAYS Performed at [facility], [address] [place], [place] [address]    Report Status 05/23/2021 FINAL  Final  Resp Panel by RT-PCR (Flu A&B, Covid) Nasopharyngeal Swab     Status: None   Collection Time: 05/18/21  8:23 PM   Specimen: Nasopharyngeal Swab; Nasopharyngeal(NP) swabs in vial transport medium  Result Value Ref Range Status   SARS Coronavirus 2 by RT PCR NEGATIVE NEGATIVE Final     Comment: (NOTE) SARS-CoV-2 target nucleic acids are NOT DETECTED.  The SARS-CoV-2 RNA is generally detectable in upper respiratory specimens during the acute phase of infection. The lowest concentration of SARS-CoV-2 viral copies this assay can detect is 138 copies/mL. A negative result does not preclude SARS-Cov-2 infection and should not be used as the sole basis for treatment or other patient management decisions. A negative result may occur with  improper specimen collection/handling, submission of specimen other than nasopharyngeal swab, presence of viral mutation(s) within the areas targeted by this assay, and inadequate number of viral copies(<138 copies/mL). A negative result must be combined with clinical observations, patient history, and epidemiological information. The expected result is Negative.  Fact Sheet for Patients:  BloggerCourse.com  Fact Sheet for Healthcare Providers:  SeriousBroker.it  This test is no t yet approved or cleared by the [place] FDA and  has been authorized for detection and/or diagnosis of SARS-CoV-2 by FDA under an Emergency Use Authorization (EUA). This EUA will remain  in effect (meaning this test can be used) for the duration of the COVID-19 declaration under Section 564(b)(1) of the Act, 21 U.S.C.section 360bbb-3(b)(1), unless the authorization is terminated  or revoked sooner.       Influenza A by PCR NEGATIVE NEGATIVE Final   Influenza B by PCR NEGATIVE NEGATIVE Final    Comment: (NOTE) The Xpert Xpress SARS-CoV-2/FLU/RSV plus assay is intended as an aid in the diagnosis of influenza from Nasopharyngeal swab specimens and should not be used as a sole basis for treatment. Nasal washings and aspirates are unacceptable for Xpert Xpress SARS-CoV-2/FLU/RSV testing.  Fact Sheet for Patients: BloggerCourse.com  Fact Sheet for Healthcare  Providers: SeriousBroker.it  This test is not yet approved or cleared by the [place] FDA and has been authorized for detection and/or diagnosis of SARS-CoV-2 by FDA under an Emergency Use Authorization (EUA). This EUA will remain in effect (meaning this test can be used) for the duration of the COVID-19 declaration under Section 564(b)(1) of the Act, 21 U.S.C. section 360bbb-3(b)(1), unless the authorization is terminated  or revoked.  Performed at [facility], [address], [place], [place] [address]   Aerobic/Anaerobic Culture w Gram Stain (surgical/deep wound)     Status: Abnormal   Collection Time: 05/18/21  9:27 PM   Specimen: PATH Other; Tissue  Result Value Ref Range Status   Specimen Description   Final    ABSCESS scrotal Performed at [facility], [address] [place], [place] [address]    Special Requests   Final    NONE Performed at [facility], [address] [place], [place] [address]    Gram Stain   Final    MODERATE WBC PRESENT,BOTH PMN AND MONONUCLEAR FEW GRAM VARIABLE ROD RARE GRAM POSITIVE COCCI IN PAIRS    Culture (A)  Final    MULTIPLE ORGANISMS PRESENT, NONE PREDOMINANT NO GROUP A STREP (S.PYOGENES) ISOLATED NO STAPHYLOCOCCUS AUREUS ISOLATED ABUNDANT BACTEROIDES OVATUS MODERATE BACTEROIDES THETAIOTAOMICRON BETA LACTAMASE POSITIVE Performed at [facility], [address] [place], [place] [address]    Report Status 05/23/2021 FINAL  Final  MRSA PCR Screening     Status: None   Collection Time: 05/19/21  1:14 AM   Specimen: Nasal Mucosa; Nasopharyngeal  Result Value Ref Range Status   MRSA by PCR NEGATIVE NEGATIVE Final    Comment:        The GeneXpert MRSA Assay (FDA approved for NASAL specimens only), is one component of a comprehensive MRSA colonization surveillance program. It is not intended to diagnose MRSA infection nor to guide or monitor treatment for MRSA  infections. Performed at [facility], [address] [place], [place] [address]          Radiology Studies: No results found.      Scheduled Meds:  Chlorhexidine Gluconate Cloth  6 each Topical Daily   insulin aspart  0-9 Units Subcutaneous TID AC & HS   midodrine  10 mg Oral TID WC   nicotine  21 mg Transdermal Daily   pantoprazole  40 mg Oral QHS   rosuvastatin  20 mg Oral Daily   senna  1 tablet Oral BID   sodium hypochlorite   Irrigation UD   Continuous Infusions:  sodium chloride     sodium chloride     ampicillin-sulbactam (UNASYN) IV 3 g (05/25/21 0610)     LOS: 7 days    Time spent: 28 minutes    Marrion Coy, MD [facility]   To contact the attending provider between 7A-7P or the covering provider during after hours 7P-7A, please log into the web site [email or web address] and access using universal [place] password for that web site. If you do not have the password, please call the hospital operator.  05/25/2021, 12:58 PM

## 2021-05-25 NOTE — Progress Notes (Signed)
Physical Therapy Treatment Patient Details Name: Thomas Tanner MRN: [id number] DOB: 07/06/1951 Today's Date: 05/25/2021    History of Present Illness Thomas Tanner is a 70 y.o. male with history of CHF, COPD on 3-1/2 L home oxygen, tobacco use disorder, recent saddle pulmonary embolism in August 2021, CAD, hypertension, chronic right sided low back pain with sciatica, who was admitted from SNF with Fournier's Gangrene, necrotizing perineal infection s/p cystoscopy, scrotal exploration and debridement.    PT Comments    Pt is making gradual progress towards goals and is very eager to discharge home soon. Currently refusing SNF recommendation and hospital bed for home use reporting he plans to either sleep in his usual bed or his lift recliner. He is anxious about getting in/out of house. Offered to perform transfers/ambulation/stair training while having sufficient assist, but patient declined. Pt only agreeable to sitting at EOB for minimal amount of time limited by pain. Will continue to progress as able.   Follow Up Recommendations  SNF (however currently refusing)     Equipment Recommendations  Hospital bed (however patient refusing)    Recommendations for Other Services       Precautions / Restrictions Precautions Precautions: Fall Precaution Comments: scrotal incisions Restrictions Weight Bearing Restrictions: No    Mobility  Bed Mobility Overal bed mobility: Needs Assistance Bed Mobility: Rolling Rolling: Min guard;+2 for physical assistance   Supine to sit: HOB elevated;+2 for physical assistance;Min assist Sit to supine: Mod assist;+2 for physical assistance   General bed mobility comments: Pt able to roll towards B directions with improved ease this date. Still uses bed functions including HOB elevation and bed rails for completion of supine->sit. Once seated, feels decreased pain with bed elevated. Only able to maintain EOB seated position for  approx 3-5 mins.    Transfers Overall transfer level: Needs assistance Equipment used: 2 person hand held assist Transfers: Lateral/Scoot Transfers          Lateral/Scoot Transfers: Min assist;+2 physical assistance General transfer comment: able to perform partial stand to perform lateral scoots up towards HOB. Pt declined further attempts for standing at this time.  Ambulation/Gait             General Gait Details: declines to attempt   Stairs             Wheelchair Mobility    Modified Rankin (Stroke Patients Only)       Balance Overall balance assessment: Needs assistance Sitting-balance support: Feet supported;Bilateral upper extremity supported Sitting balance-Leahy Scale: Fair                                      Cognition Arousal/Alertness: Awake/alert Behavior During Therapy: WFL for tasks assessed/performed Overall Cognitive Status: Within Functional Limits for tasks assessed                                        Exercises      General Comments        Pertinent Vitals/Pain Pain Assessment: Faces Faces Pain Scale: Hurts even more Pain Location: scotum and foley insertion site with movement. Pain Descriptors / Indicators: Grimacing;Guarding;Discomfort Pain Intervention(s): Limited activity within patient's tolerance    Home Living  Prior Function            PT Goals (current goals can now be found in the care plan section) Acute Rehab PT Goals Patient Stated Goal: to get back to walking PT Goal Formulation: With patient Time For Goal Achievement: 06/05/21 Potential to Achieve Goals: Fair Progress towards PT goals: Progressing toward goals    Frequency    Min 2X/week      PT Plan Current plan remains appropriate    Co-evaluation PT/OT/SLP Co-Evaluation/Treatment: Yes Reason for Co-Treatment: For patient/therapist safety;To address functional/ADL  transfers PT goals addressed during session: Mobility/safety with mobility OT goals addressed during session: Strengthening/ROM      AM-PAC PT "6 Clicks" Mobility   Outcome Measure  Help needed turning from your back to your side while in a flat bed without using bedrails?: A Little Help needed moving from lying on your back to sitting on the side of a flat bed without using bedrails?: A Lot Help needed moving to and from a bed to a chair (including a wheelchair)?: A Lot Help needed standing up from a chair using your arms (e.g., wheelchair or bedside chair)?: A Lot Help needed to walk in hospital room?: A Lot Help needed climbing 3-5 steps with a railing? : Total 6 Click Score: 12    End of Session Equipment Utilized During Treatment: Oxygen Activity Tolerance: Patient tolerated treatment well Patient left: in bed;with bed alarm set Nurse Communication: Mobility status PT Visit Diagnosis: Unsteadiness on feet (R26.81);Muscle weakness (generalized) (M62.81);Difficulty in walking, not elsewhere classified (R26.2)     Time: 8675-4492 PT Time Calculation (min) (ACUTE ONLY): 26 min  Charges:  $Therapeutic Activity: 8-22 mins                     Elizabeth Palau, PT, DPT [phone]    Sevastian Witczak 05/25/2021, 3:27 PM

## 2021-05-25 NOTE — Progress Notes (Signed)
Occupational Therapy Treatment Patient Details Name: Tramon Crescenzo MRN: [id number] DOB: 1951/07/18 Today's Date: 05/25/2021    History of present illness Baley Lorimer is a 70 y.o. male with history of CHF, COPD on 3-1/2 L home oxygen, tobacco use disorder, recent saddle pulmonary embolism in August 2021, CAD, hypertension, chronic right sided low back pain with sciatica, who was admitted from SNF with Fournier's Gangrene, necrotizing perineal infection s/p cystoscopy, scrotal exploration and debridement.   OT comments  Pt seen for OT tx this date in conjunction with PT tx to maximize services and pt benefit as pt with low functional activity tolerance 2/2 pain. Pt presents this date agreeable to session, but remains somewhat self-limiting. OT/PT engage pt in in sup<>sit transition with MIN/MOD A +2 and cues to sequence. Pt with limited sitting tolerance (~5-6 mins) 2/2 scrotal pain. In bed, using lateral rolling technique with CGA +2, OT engages pt in posterior peri care with MAX A. Will continue to follow acutely. Continue to anticipate pt will require STR f/u in SNF setting to achieve safer level of self care performance to return home.    Follow Up Recommendations  SNF    Equipment Recommendations  3 in 1 bedside commode;Tub/shower seat    Recommendations for Other Services      Precautions / Restrictions Precautions Precautions: Fall Precaution Comments: scrotal incisions Restrictions Weight Bearing Restrictions: No       Mobility Bed Mobility Overal bed mobility: Needs Assistance Bed Mobility: Rolling;Supine to Sit;Sit to Supine Rolling: Min guard;+2 for physical assistance   Supine to sit: HOB elevated;+2 for physical assistance;Min assist Sit to supine: Mod assist;+2 for physical assistance   General bed mobility comments: Pt able to roll towards B directions with improved ease this date. Still uses bed functions including HOB elevation and bed rails  for completion of supine->sit. Once seated, feels decreased pain with bed elevated. Only able to maintain EOB seated position for approx 3-5 mins.    Transfers Overall transfer level: Needs assistance Equipment used: 2 person hand held assist Transfers: Lateral/Scoot Transfers          Lateral/Scoot Transfers: Min assist;+2 physical assistance General transfer comment: able to perform partial stand to perform lateral scoots up towards HOB. Pt declined further attempts for standing at this time.    Balance Overall balance assessment: Needs assistance Sitting-balance support: Feet supported;Bilateral upper extremity supported Sitting balance-Leahy Scale: Fair                                     ADL either performed or assessed with clinical judgement   ADL                               Toileting- Clothing Manipulation and Hygiene: Maximal assistance;Bed level Toileting - Clothing Manipulation Details (indicate cue type and reason): for posterior peri care in side-lying, using lateral rolling technique to get onto clean linens             Vision Baseline Vision/History: Wears glasses Wears Glasses: At all times Patient Visual Report: No change from baseline     Perception     Praxis      Cognition Arousal/Alertness: Awake/alert Behavior During Therapy: WFL for tasks assessed/performed Overall Cognitive Status: Within Functional Limits for tasks assessed  Exercises Other Exercises Other Exercises: OT educates re: importance of thorough peri care and awareness for prevention of further infection. Pt with good understanding. some lack of insight into deficits   Shoulder Instructions       General Comments      Pertinent Vitals/ Pain       Pain Assessment: Faces Faces Pain Scale: Hurts even more Pain Location: scotum and foley insertion site with movement. Pain Descriptors /  Indicators: Grimacing;Guarding;Discomfort Pain Intervention(s): Limited activity within patient's tolerance  Home Living                                          Prior Functioning/Environment              Frequency  Min 1X/week        Progress Toward Goals  OT Goals(current goals can now be found in the care plan section)  Progress towards OT goals: Progressing toward goals  Acute Rehab OT Goals Patient Stated Goal: to get back to walking OT Goal Formulation: With patient Time For Goal Achievement: 06/05/21 Potential to Achieve Goals: Good  Plan Discharge plan remains appropriate    Co-evaluation    PT/OT/SLP Co-Evaluation/Treatment: Yes Reason for Co-Treatment: To address functional/ADL transfers;For patient/therapist safety PT goals addressed during session: Mobility/safety with mobility OT goals addressed during session: ADL's and self-care;Strengthening/ROM      AM-PAC OT "6 Clicks" Daily Activity     Outcome Measure   Help from another person eating meals?: None Help from another person taking care of personal grooming?: A Little Help from another person toileting, which includes using toliet, bedpan, or urinal?: A Lot Help from another person bathing (including washing, rinsing, drying)?: A Lot Help from another person to put on and taking off regular upper body clothing?: A Little Help from another person to put on and taking off regular lower body clothing?: A Lot 6 Click Score: 16    End of Session    OT Visit Diagnosis: Other abnormalities of gait and mobility (R26.89);Pain Pain - part of body:  (scrotum)   Activity Tolerance Patient tolerated treatment well;Other (comment) (somwhat self-limiting)   Patient Left in bed;with call bell/phone within reach;with bed alarm set   Nurse Communication          Time: 1610-9604 OT Time Calculation (min): 26 min  Charges: OT General Charges $OT Visit: 1 Visit OT Treatments $Self  Care/Home Management : 8-22 mins  Rejeana Brock, MS, OTR/L ascom [phone] 05/25/21, 5:55 PM

## 2021-05-26 DIAGNOSIS — N493 Fournier gangrene: Secondary | ICD-10-CM | POA: Diagnosis not present

## 2021-05-26 DIAGNOSIS — I5032 Chronic diastolic (congestive) heart failure: Secondary | ICD-10-CM | POA: Diagnosis not present

## 2021-05-26 DIAGNOSIS — R6520 Severe sepsis without septic shock: Secondary | ICD-10-CM | POA: Diagnosis not present

## 2021-05-26 DIAGNOSIS — A419 Sepsis, unspecified organism: Secondary | ICD-10-CM | POA: Diagnosis not present

## 2021-05-26 LAB — CBC WITH DIFFERENTIAL/PLATELET
Abs Immature Granulocytes: 0.04 10*3/uL (ref 0.00–0.07)
Basophils Absolute: 0.1 10*3/uL (ref 0.0–0.1)
Basophils Relative: 1 %
Eosinophils Absolute: 0.3 10*3/uL (ref 0.0–0.5)
Eosinophils Relative: 4 %
HCT: 24.2 % — ABNORMAL LOW (ref 39.0–52.0)
Hemoglobin: 8.4 g/dL — ABNORMAL LOW (ref 13.0–17.0)
Immature Granulocytes: 1 %
Lymphocytes Relative: 27 %
Lymphs Abs: 1.8 10*3/uL (ref 0.7–4.0)
MCH: 33.3 pg (ref 26.0–34.0)
MCHC: 34.7 g/dL (ref 30.0–36.0)
MCV: 96 fL (ref 80.0–100.0)
Monocytes Absolute: 0.7 10*3/uL (ref 0.1–1.0)
Monocytes Relative: 11 %
Neutro Abs: 3.7 10*3/uL (ref 1.7–7.7)
Neutrophils Relative %: 56 %
Platelets: 339 10*3/uL (ref 150–400)
RBC: 2.52 MIL/uL — ABNORMAL LOW (ref 4.22–5.81)
RDW: 15 % (ref 11.5–15.5)
WBC: 6.5 10*3/uL (ref 4.0–10.5)
nRBC: 0 % (ref 0.0–0.2)

## 2021-05-26 LAB — BASIC METABOLIC PANEL
Anion gap: 7 (ref 5–15)
BUN: 6 mg/dL — ABNORMAL LOW (ref 8–23)
CO2: 30 mmol/L (ref 22–32)
Calcium: 8.2 mg/dL — ABNORMAL LOW (ref 8.9–10.3)
Chloride: 98 mmol/L (ref 98–111)
Creatinine, Ser: 1.24 mg/dL (ref 0.61–1.24)
GFR, Estimated: >60 mL/min (ref >60)
Glucose, Bld: 110 mg/dL — ABNORMAL HIGH (ref 70–99)
Potassium: 4.7 mmol/L (ref 3.5–5.1)
Sodium: 135 mmol/L (ref 135–145)

## 2021-05-26 LAB — GLUCOSE, CAPILLARY
Glucose-Capillary: 103 mg/dL — ABNORMAL HIGH (ref 70–99)
Glucose-Capillary: 119 mg/dL — ABNORMAL HIGH (ref 70–99)
Glucose-Capillary: 123 mg/dL — ABNORMAL HIGH (ref 70–99)
Glucose-Capillary: 125 mg/dL — ABNORMAL HIGH (ref 70–99)

## 2021-05-26 LAB — PHOSPHORUS: Phosphorus: 2.4 mg/dL — ABNORMAL LOW (ref 2.5–4.6)

## 2021-05-26 LAB — MAGNESIUM: Magnesium: 2 mg/dL (ref 1.7–2.4)

## 2021-05-26 MED ORDER — AMOXICILLIN-POT CLAVULANATE 875-125 MG PO TABS: 1.0000 | ORAL_TABLET | Freq: Two times a day (BID) | ORAL | 0 refills | Status: AC

## 2021-05-26 MED ORDER — POTASSIUM & SODIUM PHOSPHATES 280-160-250 MG PO PACK
2.0000 | PACK | Freq: Three times a day (TID) | ORAL | Status: AC
  Administered 2021-05-26 (×2): 2 via ORAL
  Filled 2021-05-26 (×3): qty 2

## 2021-05-26 NOTE — Progress Notes (Signed)
Pt and family verbalized pt only has a chair at home and will need a hospital bed. Dr. Chipper Herb and Meagan CM aware, discharge postponed until pt can have hospital bed delivered to the home. Wound care teaching was provided to son and pt's sister today and they verbalized that they could perform dressing changes at home as needed.

## 2021-05-26 NOTE — TOC Progression Note (Addendum)
Transition of Care [facility]) - Progression Note    Patient Details  Name: Blayton Huttner MRN: [id number] Date of Birth: [birth year]  Transition of Care [facility]) CM/SW Contact  Liliana Cline, LCSW Phone Number: 05/26/2021, 10:08 AM  Clinical Narrative:   Reached out to [facility], [facility], [facility], [facility], and [facility]- they are unable to accept patient. No reply from [facility]. [facility] is checking. Also left VM for wound center to ask about availability.  Per MD patient to DC today. CSW asked RN, Urology, and MD to ensure family has been taught wet to dry dressing changes because they will need to do them on days home health comes out (IF home health is able to be arranged). Son, wife, and daughter are supportive.    Expected Discharge Plan: Long Term Nursing Home [facility] LTC) Barriers to Discharge: Continued Medical Work up, SNF Pending discharge summary, SNF Pending bed offer  Expected Discharge Plan and Services Expected Discharge Plan: Long Term Nursing Home [facility] LTC) In-house Referral: Clinical Social Work   Post Acute Care Choice: Nursing Home [facility]) Living arrangements for the past 2 months: Skilled Nursing Facility (Long term care facility)                                       Social Determinants of Health (SDOH) Interventions    Readmission Risk Interventions No flowsheet data found.

## 2021-05-26 NOTE — Discharge Summary (Addendum)
Physician Discharge Summary  Patient ID: Thomas Tanner MRN: [id number] DOB/AGE: May 28, 1951 70 y.o.  Admit date: 05/18/2021 Discharge date: 05/26/2021  Admission Diagnoses:  Discharge Diagnoses:  Active Problems:   Hypertension   CAD in native artery   Morbid obesity with BMI of 50.0-59.9, adult (HCC)   Gastroesophageal reflux disease without esophagitis   Chronic diastolic CHF (congestive heart failure) (HCC)   Hyperlipidemia   Constipated   Abscess   Fournier's gangrene of scrotum   Fournier gangrene   Sepsis (HCC)   Functional quadriplegia (HCC)   Discharged Condition: fair  Hospital Course:  This is a 70 yo male who presented to [facility] ER on 06/9 with c/o constipation, abdominal pain, and scrotal pain.  However, scrotal ultrasound and CT Pelvis revealed extensive gas in the scrotum consistent with Fournier's Gangrene, and also noted upon examination to have extensive purulent drainage from the midline of the scrotum.  ER physician consulted Urology and pt transported emergently to the OR.  He required cystoscopy; urethral dilation of urethral stricture; complex catheter placement; scrotal exploration and debridement of skin/subcutaneous tissue/muscle/ and fascia.  He was subsequently admitted to the stepdown unit per hospitalist team for additional workup and treatment.  On 06/10 he developed hypotension requiring peripheral levophed gtt. Patient was initially treated with Zosyn and Zyvox.  Culture from wound had multiple bacterial growth.  Changed to Unasyn after seen by ID.   #1.  Septic shock with severe sepsis secondary to Fournier's gangrene, necrotizing perineal infection. Fournier's gangrene, Perineal infection status postdebridement. Patient condition improving, continue antibiotics, will switch to oral Augmentin at discharge per recommendation from ID.  Last dose 6/30. Patient was initially planning to go to nursing home, patient refused it.  Spoke with Arts development officer multiple times, try to set up home care and home physical therapy, it looks like it is difficult to find agency.  We will continue to try to find the agency.  Also spoke with patient and family, they will come over here to learn how to change dressing by urology.  Urology also arrange for patient to see plastic surgery for wound closure in 2 weeks. Patient currently is medically stable to be discharged. Patient blood pressure has been improved, I will discontinue midodrine at discharge.  I will restart metoprolol while holding most of blood pressure medicines.   #2.  Acute kidney injury secondary to septic shock. Lactic acidosis secondary to septic shock. Hyponatremia. Condition had improved  3.  Type 2 diabetes per Follow-up with PCP as outpatient.  4.  COPD with chronic hypoxemic respiratory failure. Chronic diastolic congestive heart failure. Conditions are stable.  5.  Constipation. Had a bowel movement after lactulose yesterday.      Consults: urology, ICU  Significant Diagnostic Studies:    Treatments:  Antibiotics  Discharge Exam: Blood pressure 124/77, pulse 96, temperature 99 F (37.2 C), temperature source Oral, resp. rate 18, height 6\' 1"  (1.854 m), weight (!) 191 kg, SpO2 97 %. General appearance: alert and cooperative Resp: clear to auscultation bilaterally Cardio: Irregular, no murmurs. GI: soft, non-tender; bowel sounds normal; no masses,  no organomegaly Extremities: extremities normal, atraumatic, no cyanosis or edema Morbid obese.  Disposition: Discharge disposition: 01-Home or Self Care       Discharge Instructions     Diet - low sodium heart healthy   Complete by: As directed    Discharge wound care:   Complete by: As directed    Urology will tach family for dressing changes, will  also try to setup home RN.   Increase activity slowly   Complete by: As directed       Allergies as of 05/26/2021       Reactions   Morphine Anxiety,  Other (See Comments)   Hallucinations Pt states 6.17.22 that he is not allergic to this medication   Morphine And Related Anxiety        Medication List     STOP taking these medications    furosemide 40 MG tablet Commonly known as: LASIX   lisinopril 10 MG tablet Commonly known as: ZESTRIL   nicotine 21 mg/24hr patch Commonly known as: NICODERM CQ - dosed in mg/24 hours   Omega-3 500 MG Caps       TAKE these medications    amoxicillin-clavulanate 875-125 MG tablet Commonly known as: Augmentin Take 1 tablet by mouth 2 (two) times daily for 13 days.   apixaban 5 MG Tabs tablet Commonly known as: ELIQUIS Take 1 tablet (5 mg total) by mouth 2 (two) times daily.   famotidine 20 MG tablet Commonly known as: PEPCID Take 1 tablet (20 mg total) by mouth 2 (two) times daily.   metoprolol succinate 50 MG 24 hr tablet Commonly known as: TOPROL-XL Take 1 tablet by mouth once daily   phenylephrine-shark liver oil-mineral oil-petrolatum 0.25-14-74.9 % rectal ointment Commonly known as: PREPARATION H Place 1 application rectally 2 (two) times daily as needed for hemorrhoids.   polyethylene glycol 17 g packet Commonly known as: MIRALAX / GLYCOLAX Take 17 g by mouth daily.   rosuvastatin 20 MG tablet Commonly known as: CRESTOR Take 1 tablet (20 mg total) by mouth daily.   senna-docusate 8.6-50 MG tablet Commonly known as: Senokot-S Take 1 tablet by mouth 2 (two) times daily.   Ventolin HFA 108 (90 Base) MCG/ACT inhaler Generic drug: albuterol Inhale 2 puffs into the lungs every 6 (six) hours as needed for wheezing or shortness of breath.               Discharge Care Instructions  (From admission, onward)           Start     Ordered   05/26/21 0000  Discharge wound care:       Comments: Urology will tach family for dressing changes, will also try to setup home RN.   05/26/21 1037            Follow-up Information     Theotis Burrow, NP Follow  up in 1 week(s).   Specialty: Nurse Practitioner Contact information: [address] [place] [address] [phone]         Allena [name], MD Follow up in 2 week(s).   Specialty: Plastic Surgery Contact information: [address] [place] [address] [phone]         Sondra Come, MD Follow up in 4 week(s).   Specialty: Urology Contact information: [address] [place] [address] [phone]                35 minutes Signed: Marrion Coy 05/26/2021, 10:38 AM

## 2021-05-26 NOTE — Progress Notes (Signed)
   Date of Admission:  05/18/2021   TID: Thomas Tanner is a 70 y.o. male  Active Problems:   Hypertension   CAD in native artery   Morbid obesity with BMI of 50.0-59.9, adult (HCC)   Gastroesophageal reflux disease without esophagitis   Chronic diastolic CHF (congestive heart failure) (HCC)   Hyperlipidemia   Constipated   Abscess   Fournier's gangrene of scrotum   Fournier gangrene   Sepsis (HCC)   Functional quadriplegia (HCC)    Subjective: Doing well Son at bed side- to learn about dressing  Medications:   apixaban  5 mg Oral BID   Chlorhexidine Gluconate Cloth  6 each Topical Daily   insulin aspart  0-9 Units Subcutaneous TID AC & HS   lactulose  20 g Oral Once   midodrine  10 mg Oral TID WC   nicotine  21 mg Transdermal Daily   pantoprazole  40 mg Oral QHS   potassium & sodium phosphates  2 packet Oral TID WC & HS   rosuvastatin  20 mg Oral Daily   senna  1 tablet Oral BID   sodium hypochlorite   Irrigation UD    Objective: Vital signs in last 24 hours: Temp:  [97.9 F (36.6 C)-99 F (37.2 C)] 98 F (36.7 C) (06/17 1109) Pulse Rate:  [83-96] 83 (06/17 1109) Resp:  [16-20] 16 (06/17 1109) BP: (120-137)/(50-77) 137/68 (06/17 1109) SpO2:  [95 %-98 %] 96 % (06/17 1109)  PHYSICAL EXAM:  General: Alert, cooperative, no distress, appears stated age. obesity Head: Normocephalic, without obvious abnormality, atraumatic. Eyes: Conjunctivae clear, anicteric sclerae. Pupils are equal ENT Nares normal. No drainage or sinus tenderness. Lips, mucosa, and tongue normal. No Thrush Neck: Supple, symmetrical, no adenopathy, thyroid: non tender no carotid bruit and no JVD. Back: No CVA tenderness. Lungs: Clear to auscultation bilaterally. No Wheezing or Rhonchi. No rales. Heart: Regular rate and rhythm, no murmur, rub or gallop. Abdomen: Soft, non-tender,not distended. Bowel sounds normal. No masses         Extremities: atraumatic, no cyanosis. No edema.  No clubbing Skin: No rashes or lesions. Or bruising Lymph: Cervical, supraclavicular normal. Neurologic: Grossly non-focal  Lab Results Recent Labs    05/25/21 0523 05/26/21 0525  WBC 5.5 6.5  HGB 8.7* 8.4*  HCT 27.1* 24.2*  NA 136 135  K 3.9 4.7  CL 98 98  CO2 32 30  BUN 7* 6*  CREATININE 1.03 1.24   Microbiology:  Studies/Results: No results found.   Assessment/Plan:  40 years gangrene.  Status post I&D and debridement x3.  Currently on Unasyn.  Will need at least 2 to 3 weeks of antibiotics.  Day 7 of IV antibiotic.  Patient is getting discharged today and will be switched to oral Augmentin which will continue for 2 more weeks until 06/08/2021.  He will see me in my office on 06/06/2021 and will decide on whether he needs further antibiotics.  Recent Bacteroides bacteremia.  Resolved  Morbid obesity  . Discussed the management with patient

## 2021-05-26 NOTE — TOC Transition Note (Addendum)
Transition of Care [facility]) - CM/SW Discharge Note   Patient Details  Name: Thomas Tanner MRN: [id number] Date of Birth: 1951-07-04  Transition of Care [facility]) CM/SW Contact:  Liliana Cline, LCSW Phone Number: 05/26/2021, 1:07 PM   Clinical Narrative:   [facility] is only agency able to accept patient, with start of care of 6/20. Notified RN and MD. RN was unable to do wound care teaching with son. Asked RN to ensure family is trained on dressing changes before patient discharges this afternoon, family is supposed to come in at 5pm today. Per MD, patient to discharge home after training. Left VM for wife with update and CSW's number for any questions or additional needs.  Confirmed with RN patient will need EMS transport home. Completed EMS Paperwork and asked Nurse Secretary to place in DC packet. [facility] EMS will need to be called once wound care training is done.  5:35- Call from RN. Patient and family are reporting patient needs a hospital bed before he can discharge home. Patient previously refused a hospital bed. Says he now feels he needs it. CSW called Jermaine with [facility] and Rhonda with [facility]. [facility] is out of hospital beds and [facility] cannot deliver a hospital bed today. Patient and family refusing DC until hospital bed can be delivered, do not want patient to DC and delivery be arranged once he is home. Notified MD.  Referral made to [facility] with [facility] for hospital bed, asked for delivery as soon as possible. She is checking if it can be delivred tomorrow 6/18. Asked MD for orders.   Final next level of care: Home w Home Health Services Barriers to Discharge: Barriers Resolved   Patient Goals and CMS Choice Patient states their goals for this hospitalization and ongoing recovery are:: home with home health CMS Medicare.gov Compare Post Acute Care list provided to:: Patient Represenative (must comment) Choice offered to / list presented to : Spouse (by [facility] member  on admission)  Discharge Placement                    Patient and family notified of of transfer: 05/26/21  Discharge Plan and Services In-house Referral: Clinical Social Work   Post Acute Care Choice: Nursing Home [facility])                    HH Arranged: RN, PT, OT [facility] Agency: [facility] Date [facility] Agency Contacted: 05/26/21   Representative spoke with at [facility] Agency: [place]  Social Determinants of Health (SDOH) Interventions     Readmission Risk Interventions No flowsheet data found.

## 2021-05-27 DIAGNOSIS — R6520 Severe sepsis without septic shock: Secondary | ICD-10-CM | POA: Diagnosis not present

## 2021-05-27 DIAGNOSIS — N493 Fournier gangrene: Secondary | ICD-10-CM | POA: Diagnosis not present

## 2021-05-27 DIAGNOSIS — A419 Sepsis, unspecified organism: Secondary | ICD-10-CM | POA: Diagnosis not present

## 2021-05-27 DIAGNOSIS — I5032 Chronic diastolic (congestive) heart failure: Secondary | ICD-10-CM | POA: Diagnosis not present

## 2021-05-27 LAB — GLUCOSE, CAPILLARY
Glucose-Capillary: 108 mg/dL — ABNORMAL HIGH (ref 70–99)
Glucose-Capillary: 136 mg/dL — ABNORMAL HIGH (ref 70–99)

## 2021-05-27 MED ORDER — VITAMIN B-12 1000 MCG PO TABS
1000.0000 ug | ORAL_TABLET | Freq: Every day | ORAL | Status: DC
  Administered 2021-05-27: 1000 ug via ORAL
  Filled 2021-05-27: qty 1

## 2021-05-27 MED ORDER — POLYSACCHARIDE IRON COMPLEX 150 MG PO CAPS
150.0000 mg | ORAL_CAPSULE | Freq: Every day | ORAL | Status: DC
  Filled 2021-05-27: qty 1

## 2021-05-27 MED ORDER — CYANOCOBALAMIN 1000 MCG PO TABS: 1000.0000 ug | ORAL_TABLET | Freq: Every day | ORAL | 0 refills | Status: DC

## 2021-05-27 MED ORDER — POLYSACCHARIDE IRON COMPLEX 150 MG PO CAPS: 150.0000 mg | ORAL_CAPSULE | Freq: Every day | ORAL | 0 refills | Status: DC

## 2021-05-27 NOTE — TOC Transition Note (Addendum)
Transition of Care [facility]) - CM/SW Discharge Note   Patient Details  Name: Thomas Tanner MRN: [id number] Date of Birth: November 05, 1951  Transition of Care [facility]) CM/SW Contact:  Liliana Cline, LCSW Phone Number: 05/27/2021, 8:54 AM   Clinical Narrative:  Bariatric hospital bed not available at [facility] or [facility]. Called [facility]- spoke with both [name] and Tobi Bastos on weekend intake line. Bariatric hospital bed ordered through their [facility]. Faxed order, demographics, and MD note to [phone] as instructed. Tobi Bastos reported their office will call patient/family to schedule delivery, likely on Monday. Left VM for patient's wife with update. Updated MD and RN. No other needs. Per MD patient will DC today and has now stated he is comfortable going home and waiting for hospital bed delivery from home.   New EMS paperwork completed. Will call [facility] after 1pm per RN.  Please reconsult TOC if additional needs arise.   1:00- Asked RN if she is ready for CSW to call transport.   1:40[facility] EMS called for nonemergent transport. Patient is 2nd on the list. Notified RN.     Final next level of care: Home w Home Health Services Barriers to Discharge: Barriers Resolved   Patient Goals and CMS Choice Patient states their goals for this hospitalization and ongoing recovery are:: home with home health CMS Medicare.gov Compare Post Acute Care list provided to:: Patient Represenative (must comment) Choice offered to / list presented to : Spouse  Discharge Placement                Patient to be transferred to facility by: [facility] Name of family member notified: left VM for wife on 6/17. Patient and family aware of DC today. Patient and family notified of of transfer: 05/27/21  Discharge Plan and Services In-house Referral: Clinical Social Work   Post Acute Care Choice: Nursing Home [facility])          DME Arranged: Hospital  bed DME Agency:  ([facility]) Date DME Agency Contacted: 05/27/21   Representative spoke with at DME Agency: [name] HH Arranged: RN, PT, OT [facility] Agency: [facility] Date [facility] Agency Contacted: 05/26/21   Representative spoke with at [facility] Agency: [place]  Social Determinants of Health (SDOH) Interventions     Readmission Risk Interventions No flowsheet data found.

## 2021-05-27 NOTE — Plan of Care (Signed)
  Problem: Clinical Measurements: Goal: Ability to maintain clinical measurements within normal limits will improve Outcome: Progressing Goal: Will remain free from infection Outcome: Progressing Goal: Diagnostic test results will improve Outcome: Progressing Goal: Respiratory complications will improve Outcome: Progressing Goal: Cardiovascular complication will be avoided Outcome: Progressing   Problem: Pain Managment: Goal: General experience of comfort will improve Outcome: Progressing   Patient V/S stable. Complained of pain on his scrotum area; tylenol given with relief. Dressing intact. Pt supposedly discharging yesterday, pt no longer has peripheral IV. [facility] still IV, Oncall provider informed.

## 2021-05-27 NOTE — Discharge Summary (Addendum)
Physician Discharge Summary  Patient ID: Thomas Tanner MRN: [id number] DOB/AGE: [birth year] 70 y.o.  Admit date: 05/18/2021 Discharge date: 05/27/2021  Admission Diagnoses:  Discharge Diagnoses:  Active Problems:   Hypertension   CAD in native artery   Morbid obesity with BMI of 50.0-59.9, adult (HCC)   Gastroesophageal reflux disease without esophagitis   Chronic diastolic CHF (congestive heart failure) (HCC)   Hyperlipidemia   Constipated   Abscess   Fournier's gangrene of scrotum   Fournier gangrene   Sepsis (HCC)   Functional quadriplegia (HCC)   Discharged Condition: good  Hospital Course:  This is a 70 yo male who presented to [facility] ER on 06/9 with c/o constipation, abdominal pain, and scrotal pain.  However, scrotal ultrasound and CT Pelvis revealed extensive gas in the scrotum consistent with Fournier's Gangrene, and also noted upon examination to have extensive purulent drainage from the midline of the scrotum.  ER physician consulted Urology and pt transported emergently to the OR.  He required cystoscopy; urethral dilation of urethral stricture; complex catheter placement; scrotal exploration and debridement of skin/subcutaneous tissue/muscle/ and fascia.  He was subsequently admitted to the stepdown unit per hospitalist team for additional workup and treatment.  On 06/10 he developed hypotension requiring peripheral levophed gtt. Patient was initially treated with Zosyn and Zyvox.  Culture from wound had multiple bacterial growth.  Changed to Unasyn after seen by ID.   #1.  Septic shock with severe sepsis secondary to Fournier's gangrene, necrotizing perineal infection. Fournier's gangrene, Perineal infection status postdebridement. Patient condition improving, continue antibiotics, will switch to oral Augmentin at discharge per recommendation from ID.  Last dose 6/30. Patient was initially planning to go to nursing home, patient refused it.  Spoke with Arts development officer multiple times, try to set up home care and home physical therapy, it looks like it is difficult to find agency.  We will continue to try to find the agency.  Also spoke with patient and family, they will come over here to learn how to change dressing by urology.  Urology also arrange for patient to see plastic surgery for wound closure in 2 weeks. Patient currently is medically stable to be discharged. Patient blood pressure has been improved, I will discontinue midodrine at discharge.  I will restart metoprolol while holding most of blood pressure medicines.   #2.  Acute kidney injury secondary to septic shock. Lactic acidosis secondary to septic shock. Hyponatremia. Condition had improved  3.  Type 2 diabetes per Follow-up with PCP as outpatient.  4.  COPD with chronic hypoxemic respiratory failure. Chronic diastolic congestive heart failure. Conditions are stable.  5.  Constipation. Had a bowel movement after lactulose.  #6.  Iron deficient anemia with borderline B12 level. Pending homocystine level. Start iron and B12 supplements.   Patient could not go home pending hospital bed.  Hospital bed will be delivered today.   Consults: ID and urology  Significant Diagnostic Studies:     Treatments: Antibiotics, I&D  Discharge Exam: Blood pressure (!) 147/69, pulse 91, temperature 97.8 F (36.6 C), temperature source Oral, resp. rate 18, height 6\' 1"  (1.854 m), weight (!) 191 kg, SpO2 100 %. General appearance: alert and cooperative Resp: clear to auscultation bilaterally Cardio: regular rate and rhythm, S1, S2 normal, no murmur, click, rub or gallop GI: soft, non-tender; bowel sounds normal; no masses,  no organomegaly Extremities: extremities normal, atraumatic, no cyanosis or edema  Disposition: Discharge disposition: 01-Home or Self Care  Discharge Instructions     Diet - low sodium heart healthy   Complete by: As directed    Diet - low sodium heart  healthy   Complete by: As directed    Discharge wound care:   Complete by: As directed    Urology will tach family for dressing changes, will also try to setup home RN.   Discharge wound care:   Complete by: As directed    Family educated for dressing change, visiting RN can also help   Increase activity slowly   Complete by: As directed    Increase activity slowly   Complete by: As directed       Allergies as of 05/27/2021       Reactions   Morphine Anxiety, Other (See Comments)   Hallucinations Pt states 6.17.22 that he is not allergic to this medication   Morphine And Related Anxiety        Medication List     STOP taking these medications    furosemide 40 MG tablet Commonly known as: LASIX   lisinopril 10 MG tablet Commonly known as: ZESTRIL   nicotine 21 mg/24hr patch Commonly known as: NICODERM CQ - dosed in mg/24 hours   Omega-3 500 MG Caps       TAKE these medications    amoxicillin-clavulanate 875-125 MG tablet Commonly known as: Augmentin Take 1 tablet by mouth 2 (two) times daily for 13 days.   apixaban 5 MG Tabs tablet Commonly known as: ELIQUIS Take 1 tablet (5 mg total) by mouth 2 (two) times daily.   cyanocobalamin 1000 MCG tablet Take 1 tablet (1,000 mcg total) by mouth daily.   famotidine 20 MG tablet Commonly known as: PEPCID Take 1 tablet (20 mg total) by mouth 2 (two) times daily.   iron polysaccharides 150 MG capsule Commonly known as: NIFEREX Take 1 capsule (150 mg total) by mouth daily.   metoprolol succinate 50 MG 24 hr tablet Commonly known as: TOPROL-XL Take 1 tablet by mouth once daily   phenylephrine-shark liver oil-mineral oil-petrolatum 0.25-14-74.9 % rectal ointment Commonly known as: PREPARATION H Place 1 application rectally 2 (two) times daily as needed for hemorrhoids.   polyethylene glycol 17 g packet Commonly known as: MIRALAX / GLYCOLAX Take 17 g by mouth daily.   rosuvastatin 20 MG tablet Commonly  known as: CRESTOR Take 1 tablet (20 mg total) by mouth daily.   senna-docusate 8.6-50 MG tablet Commonly known as: Senokot-S Take 1 tablet by mouth 2 (two) times daily.   Ventolin HFA 108 (90 Base) MCG/ACT inhaler Generic drug: albuterol Inhale 2 puffs into the lungs every 6 (six) hours as needed for wheezing or shortness of breath.               Durable Medical Equipment  (From admission, onward)           Start     Ordered   05/27/21 0853  For home use only DME Hospital bed  Once       Comments: Bariatric bed please  Question Answer Comment  Length of Need 12 Months   Bed type Semi-electric      05/27/21 0853              Discharge Care Instructions  (From admission, onward)           Start     Ordered   05/27/21 0000  Discharge wound care:       Comments: Family educated for dressing change,  visiting RN can also help   05/27/21 0921   05/26/21 0000  Discharge wound care:       Comments: Urology will tach family for dressing changes, will also try to setup home RN.   05/26/21 1037            Follow-up Information     Theotis Burrow, NP Follow up in 1 week(s).   Specialty: Nurse Practitioner Contact information: [address] [place] [address] [phone]         Allena [name], MD Follow up in 2 week(s).   Specialty: Plastic Surgery Contact information: [address] [place] [address] [phone]         Sondra Come, MD Follow up in 4 week(s).   Specialty: Urology Contact information: [address] [place] [address] [phone]                 Signed: Marrion Coy 05/27/2021, 9:21 AM

## 2021-05-27 NOTE — Plan of Care (Signed)

## 2021-05-29 LAB — GLUCOSE, CAPILLARY: Glucose-Capillary: 112 mg/dL — ABNORMAL HIGH (ref 70–99)

## 2021-05-30 ENCOUNTER — Encounter: Payer: Self-pay | Admitting: Internal Medicine

## 2021-05-30 ENCOUNTER — Ambulatory Visit (INDEPENDENT_AMBULATORY_CARE_PROVIDER_SITE_OTHER): Payer: Medicare Other | Admitting: Internal Medicine

## 2021-05-30 ENCOUNTER — Telehealth: Payer: Self-pay

## 2021-05-30 VITALS — Resp 16 | Ht 73.0 in | Wt >= 6400 oz

## 2021-05-30 DIAGNOSIS — K5909 Other constipation: Secondary | ICD-10-CM | POA: Diagnosis not present

## 2021-05-30 DIAGNOSIS — I5032 Chronic diastolic (congestive) heart failure: Secondary | ICD-10-CM | POA: Diagnosis not present

## 2021-05-30 DIAGNOSIS — J9611 Chronic respiratory failure with hypoxia: Secondary | ICD-10-CM

## 2021-05-30 DIAGNOSIS — A419 Sepsis, unspecified organism: Secondary | ICD-10-CM | POA: Diagnosis not present

## 2021-05-30 DIAGNOSIS — R1310 Dysphagia, unspecified: Secondary | ICD-10-CM | POA: Diagnosis not present

## 2021-05-30 DIAGNOSIS — J449 Chronic obstructive pulmonary disease, unspecified: Secondary | ICD-10-CM | POA: Diagnosis not present

## 2021-05-30 DIAGNOSIS — E872 Acidosis: Secondary | ICD-10-CM | POA: Diagnosis not present

## 2021-05-30 DIAGNOSIS — N493 Fournier gangrene: Secondary | ICD-10-CM

## 2021-05-30 DIAGNOSIS — R532 Functional quadriplegia: Secondary | ICD-10-CM | POA: Diagnosis not present

## 2021-05-30 DIAGNOSIS — E785 Hyperlipidemia, unspecified: Secondary | ICD-10-CM | POA: Diagnosis not present

## 2021-05-30 DIAGNOSIS — N179 Acute kidney failure, unspecified: Secondary | ICD-10-CM | POA: Diagnosis not present

## 2021-05-30 DIAGNOSIS — Z09 Encounter for follow-up examination after completed treatment for conditions other than malignant neoplasm: Secondary | ICD-10-CM

## 2021-05-30 DIAGNOSIS — I110 Hypertensive heart disease with heart failure: Secondary | ICD-10-CM | POA: Diagnosis not present

## 2021-05-30 DIAGNOSIS — I2510 Atherosclerotic heart disease of native coronary artery without angina pectoris: Secondary | ICD-10-CM | POA: Diagnosis not present

## 2021-05-30 DIAGNOSIS — D509 Iron deficiency anemia, unspecified: Secondary | ICD-10-CM | POA: Diagnosis not present

## 2021-05-30 DIAGNOSIS — Z7901 Long term (current) use of anticoagulants: Secondary | ICD-10-CM | POA: Diagnosis not present

## 2021-05-30 DIAGNOSIS — E871 Hypo-osmolality and hyponatremia: Secondary | ICD-10-CM | POA: Diagnosis not present

## 2021-05-30 DIAGNOSIS — Z6841 Body Mass Index (BMI) 40.0 and over, adult: Secondary | ICD-10-CM

## 2021-05-30 NOTE — Progress Notes (Signed)
[facility] [address] [place], [place] [address]  Internal MEDICINE  Office Visit Note  Patient Name: Thomas Tanner  [id number]  [id number]  Date of Service: 05/31/2021  Transitional care management   Chief Complaint  Patient presents with   Hospitalization Follow-up    Pt wants home health care, for wound care   Diabetes   Hyperlipidemia   Hypertension   COPD   Telephone Assessment    Phone call   Telephone Screen    [phone]   Quality Metric Gaps    Shingrix, colonoscopy     HPI Patient is connected for transitional care management and hospital follow-up. He was hospitalized for scrotal gangrene was seen by urology. Patient was found to be in septic shock Due to necrotizing perineal infection. Patient is in need of home health and wound care he declined to go to the nursing home Patient was discharged home on Augmentin, he is having problem with dressing change, sleeping, posture adjustment, high risk for pressure ulcers   Hospital course presented to [facility] ER on 06/9 with c/o constipation, abdominal pain, and scrotal pain.  However, scrotal ultrasound and CT Pelvis revealed extensive gas in the scrotum consistent with Fournier's Gangrene, and also noted upon examination to have extensive purulent drainage from the midline of the scrotum.  ER physician consulted Urology and pt transported emergently to the OR.  He required cystoscopy; urethral dilation of urethral stricture; complex catheter placement; scrotal exploration and debridement of skin/subcutaneous tissue/muscle/ and fascia.  He was subsequently admitted to the stepdown unit per hospitalist team for additional workup and treatment.  On 06/10 he developed hypotension requiring peripheral levophed gtt. Patient was initially treated with Zosyn and Zyvox.  Culture from wound had multiple bacterial growth.  Changed to Unasyn after seen by ID Acute medical problem includes recent recent admission  of COPD exacerbation patient is also on oxygen has history of pulmonary embolism and is on chronic anticoagulation History of COPD, history of chronic congestive heart failure, history of diabetes and patient also has chronic hypoxemic respiratory failure Current Medication: Outpatient Encounter Medications as of 05/30/2021  Medication Sig Note   amoxicillin-clavulanate (AUGMENTIN) 875-125 MG tablet Take 1 tablet by mouth 2 (two) times daily for 13 days.    famotidine (PEPCID) 20 MG tablet Take 1 tablet (20 mg total) by mouth 2 (two) times daily.    iron polysaccharides (NIFEREX) 150 MG capsule Take 1 capsule (150 mg total) by mouth daily.    metoprolol succinate (TOPROL-XL) 50 MG 24 hr tablet Take 1 tablet by mouth once daily    phenylephrine-shark liver oil-mineral oil-petrolatum (PREPARATION H) 0.25-14-74.9 % rectal ointment Place 1 application rectally 2 (two) times daily as needed for hemorrhoids.    polyethylene glycol (MIRALAX / GLYCOLAX) 17 g packet Take 17 g by mouth daily.    rosuvastatin (CRESTOR) 20 MG tablet Take 1 tablet (20 mg total) by mouth daily.    senna-docusate (SENOKOT-S) 8.6-50 MG tablet Take 1 tablet by mouth 2 (two) times daily.    VENTOLIN HFA 108 (90 Base) MCG/ACT inhaler Inhale 2 puffs into the lungs every 6 (six) hours as needed for wheezing or shortness of breath. 08/12/2020: Reports uses prn- reports uses 1-2 times per day   vitamin B-12 1000 MCG tablet Take 1 tablet (1,000 mcg total) by mouth daily.    apixaban (ELIQUIS) 5 MG TABS tablet Take 1 tablet (5 mg total) by mouth 2 (two) times daily. 05/05/2021: Pt takes this medication at  6am and around 8pm   No facility-administered encounter medications on file as of 05/30/2021.    Surgical History: Past Surgical History:  Procedure Laterality Date   CORONARY/GRAFT ACUTE MI REVASCULARIZATION N/A 12/08/2018   Procedure: Coronary/Graft Acute MI Revascularization;  Surgeon: Alwyn Pea, MD;  Location: [facility] INVASIVE  CV LAB;  Service: Cardiovascular;  Laterality: N/A;   INCISION AND DRAINAGE ABSCESS N/A 05/18/2021   Procedure: INCISION AND DRAINAGE SCROTAL ABSCESS, cystoscopy with urethral dilation, and complex catheter placement;  Surgeon: Sondra Come, MD;  Location: [facility] ORS;  Service: Urology;  Laterality: N/A;   IRRIGATION AND DEBRIDEMENT ABSCESS N/A 05/20/2021   Procedure: IRRIGATION AND DEBRIDEMENT ABSCESS;  Surgeon: Riki Altes, MD;  Location: [facility] ORS;  Service: Urology;  Laterality: N/A;   IRRIGATION AND DEBRIDEMENT ABSCESS N/A 05/21/2021   Procedure: IRRIGATION AND DEBRIDEMENT ABSCESS;  Surgeon: Riki Altes, MD;  Location: [facility] ORS;  Service: Urology;  Laterality: N/A;   KIDNEY STONE SURGERY     left arm surgery  1963   LEFT HEART CATH AND CORONARY ANGIOGRAPHY N/A 12/08/2018   Procedure: LEFT HEART CATH AND CORONARY ANGIOGRAPHY;  Surgeon: Alwyn Pea, MD;  Location: [facility] INVASIVE CV LAB;  Service: Cardiovascular;  Laterality: N/A;   PULMONARY THROMBECTOMY N/A 08/03/2020   Procedure: PULMONARY THROMBECTOMY / THROMBOLYSIS;  Surgeon: Annice Needy, MD;  Location: [facility] INVASIVE CV LAB;  Service: Cardiovascular;  Laterality: N/A;   RECTAL EXAM UNDER ANESTHESIA N/A 05/20/2021   Procedure: EXAM UNDER ANESTHESIA-Dressing and Change;  Surgeon: Riki Altes, MD;  Location: [facility] ORS;  Service: Urology;  Laterality: N/A;    Medical History: Past Medical History:  Diagnosis Date   Arthritis    CHF (congestive heart failure) (HCC)    COPD (chronic obstructive pulmonary disease) (HCC)    Diabetes (HCC)    Hyperlipidemia    Hypertension    Kidney stone    Obesity    Tachycardia    TIA (transient ischemic attack)     Family History: Family History  Problem Relation Age of Onset   Alzheimer's disease Mother    CAD Father     Social History   Socioeconomic History   Marital status: Married    Spouse name: [name]    Number of children: 3   Years of education: Not on file    Highest education level: Not on file  Occupational History   Not on file  Tobacco Use   Smoking status: Every Day    Packs/day: 0.50    Pack years: 0.00    Types: Cigarettes    Last attempt to quit: 07/13/2020    Years since quitting: 0.8   Smokeless tobacco: Never  Substance and Sexual Activity   Alcohol use: No   Drug use: No   Sexual activity: Not Currently  Other Topics Concern   Not on file  Social History Narrative   Lives at home with wife , youngest son, grand daughter and great greatson    Social Determinants of Health   Financial Resource Strain: Not on file  Food Insecurity: No Food Insecurity   Worried About Programme researcher, broadcasting/film/video in the Last Year: Never true   Barista in the Last Year: Never true  Transportation Needs: No Transportation Needs   Lack of Transportation (Medical): No   Lack of Transportation (Non-Medical): No  Physical Activity: Not on file  Stress: Not on file  Social Connections: Not on file  Intimate Partner Violence:  Not on file      Review of Systems  Constitutional:  Negative for chills, fatigue and unexpected weight change.  HENT:  Positive for postnasal drip. Negative for congestion, rhinorrhea, sneezing and sore throat.   Eyes:  Negative for redness.  Respiratory:  Negative for cough, chest tightness and shortness of breath.   Cardiovascular:  Negative for chest pain and palpitations.  Gastrointestinal:  Negative for abdominal pain, constipation, diarrhea, nausea and vomiting.  Genitourinary:  Negative for dysuria and frequency.  Musculoskeletal:  Negative for arthralgias, back pain, joint swelling and neck pain.  Skin:  Positive for wound. Negative for rash.       Pain in his scrotal and perineal area  Neurological: Negative.  Negative for tremors and numbness.  Hematological:  Negative for adenopathy. Does not bruise/bleed easily.  Psychiatric/Behavioral:  Negative for behavioral problems (Depression), sleep disturbance and  suicidal ideas. The patient is not nervous/anxious.    Vital Signs: Resp 16   Ht 6\' 1"  (1.854 m)   Wt (!) 400 lb (181.4 kg)   BMI 52.77 kg/m    Physical Exam This is a virtual visit unable to perform any exam,  pt looks uncomfortable    Assessment/Plan: 1. Fournier's gangrene of scrotum Pt needs frequent and constant nursing care, rehab and wound care, needs hospital bed to avoid pressure ulcers, will aid in healing process, son is having hard time changing the dress  - Ambulatory referral to Home Health  2. Hospital discharge follow-up All hospital records are reviewed, will follow along   - Ambulatory referral to Home Health  3. Chronic respiratory failure with hypoxia (HCC) Pt is to continue on his home o2, per hospital records he is om 3 Lnc  - Ambulatory referral to Home Health  4. Morbid obesity with BMI of 50.0-59.9, adult [facility]) Encouraged making right choice for food intake  - Ambulatory referral to Home Health   General Counseling: Hasani verbalizes understanding of the findings of todays visit and agrees with plan of treatment. I have discussed any further diagnostic evaluation that may be needed or ordered today. We also reviewed his medications today. he has been encouraged to call the office with any questions or concerns that should arise related to todays visit.    Orders Placed This Encounter  Procedures   Ambulatory referral to Home Health     No orders of the defined types were placed in this encounter.   Total time spent:45 Minutes Time spent includes review of chart, medications, test results, and follow up plan with the patient.   [place] Controlled Substance Database was reviewed by me.   Dr [facility] Internal medicine

## 2021-05-30 NOTE — Telephone Encounter (Signed)
Spoke to [employer] Emergency planning/management officer) at [facility], she advised that pt already is getting home health services from where pt was released from hospital and [facility] will call for further orders when needed.

## 2021-05-31 ENCOUNTER — Telehealth: Payer: Self-pay

## 2021-05-31 NOTE — Telephone Encounter (Signed)
Trey Paula from [facility] [phone]) called wanting clarification on orders and follow up for patient. Per Dr. Richardo Hanks orders are as follows:  kerlix damp to dry with ABD on top, pack wound tightly once daily follow up with plastic surgery in 2 weeks for wound options, follow up with our office in  3 weeks for foley removal and wound check. PRN order for foley irrigation if cath is clogged given with verbal read back  Office visit with our office is booked on 7/11 w/Sam. Trey Paula states patient had apt with our office tomorrow, this was cancelled at discharge and rescheduled he states he will make patient aware.  Trey Paula asked about orders for cath replacement if necessary. He was instructed to contact the office for orders or advice if there were any issues with the catheter and if irrigation was unsuccessful.  **Per Dr. Richardo Hanks replacement would not be recommended by home health difficult foley placement.

## 2021-06-01 ENCOUNTER — Ambulatory Visit: Payer: Self-pay | Admitting: Urology

## 2021-06-01 DIAGNOSIS — J449 Chronic obstructive pulmonary disease, unspecified: Secondary | ICD-10-CM | POA: Diagnosis not present

## 2021-06-02 ENCOUNTER — Telehealth: Payer: Self-pay

## 2021-06-02 DIAGNOSIS — R1310 Dysphagia, unspecified: Secondary | ICD-10-CM | POA: Diagnosis not present

## 2021-06-02 DIAGNOSIS — I2510 Atherosclerotic heart disease of native coronary artery without angina pectoris: Secondary | ICD-10-CM | POA: Diagnosis not present

## 2021-06-02 DIAGNOSIS — A419 Sepsis, unspecified organism: Secondary | ICD-10-CM | POA: Diagnosis not present

## 2021-06-02 DIAGNOSIS — K5909 Other constipation: Secondary | ICD-10-CM | POA: Diagnosis not present

## 2021-06-02 DIAGNOSIS — J449 Chronic obstructive pulmonary disease, unspecified: Secondary | ICD-10-CM | POA: Diagnosis not present

## 2021-06-02 DIAGNOSIS — R532 Functional quadriplegia: Secondary | ICD-10-CM | POA: Diagnosis not present

## 2021-06-02 DIAGNOSIS — E872 Acidosis: Secondary | ICD-10-CM | POA: Diagnosis not present

## 2021-06-02 DIAGNOSIS — I110 Hypertensive heart disease with heart failure: Secondary | ICD-10-CM | POA: Diagnosis not present

## 2021-06-02 DIAGNOSIS — E871 Hypo-osmolality and hyponatremia: Secondary | ICD-10-CM | POA: Diagnosis not present

## 2021-06-02 DIAGNOSIS — Z7901 Long term (current) use of anticoagulants: Secondary | ICD-10-CM | POA: Diagnosis not present

## 2021-06-02 DIAGNOSIS — D509 Iron deficiency anemia, unspecified: Secondary | ICD-10-CM | POA: Diagnosis not present

## 2021-06-02 DIAGNOSIS — I5032 Chronic diastolic (congestive) heart failure: Secondary | ICD-10-CM | POA: Diagnosis not present

## 2021-06-02 DIAGNOSIS — E785 Hyperlipidemia, unspecified: Secondary | ICD-10-CM | POA: Diagnosis not present

## 2021-06-02 DIAGNOSIS — J9611 Chronic respiratory failure with hypoxia: Secondary | ICD-10-CM | POA: Diagnosis not present

## 2021-06-02 DIAGNOSIS — N179 Acute kidney failure, unspecified: Secondary | ICD-10-CM | POA: Diagnosis not present

## 2021-06-02 NOTE — Telephone Encounter (Signed)
On 05/31/21 Thomas Tanner [phone] [facility] called requesting verbal orders for pt.  He needed: Nursing 2 x wk x 6 wks for wound care PT evaluate and treat Social worker for community resources I gave verbal ok for all services.

## 2021-06-05 DIAGNOSIS — A419 Sepsis, unspecified organism: Secondary | ICD-10-CM | POA: Diagnosis not present

## 2021-06-05 DIAGNOSIS — I2510 Atherosclerotic heart disease of native coronary artery without angina pectoris: Secondary | ICD-10-CM | POA: Diagnosis not present

## 2021-06-05 DIAGNOSIS — R1310 Dysphagia, unspecified: Secondary | ICD-10-CM | POA: Diagnosis not present

## 2021-06-05 DIAGNOSIS — E872 Acidosis: Secondary | ICD-10-CM | POA: Diagnosis not present

## 2021-06-05 DIAGNOSIS — R532 Functional quadriplegia: Secondary | ICD-10-CM | POA: Diagnosis not present

## 2021-06-05 DIAGNOSIS — I5032 Chronic diastolic (congestive) heart failure: Secondary | ICD-10-CM | POA: Diagnosis not present

## 2021-06-05 DIAGNOSIS — Z7901 Long term (current) use of anticoagulants: Secondary | ICD-10-CM | POA: Diagnosis not present

## 2021-06-05 DIAGNOSIS — D509 Iron deficiency anemia, unspecified: Secondary | ICD-10-CM | POA: Diagnosis not present

## 2021-06-05 DIAGNOSIS — J449 Chronic obstructive pulmonary disease, unspecified: Secondary | ICD-10-CM | POA: Diagnosis not present

## 2021-06-05 DIAGNOSIS — E785 Hyperlipidemia, unspecified: Secondary | ICD-10-CM | POA: Diagnosis not present

## 2021-06-05 DIAGNOSIS — N179 Acute kidney failure, unspecified: Secondary | ICD-10-CM | POA: Diagnosis not present

## 2021-06-05 DIAGNOSIS — I110 Hypertensive heart disease with heart failure: Secondary | ICD-10-CM | POA: Diagnosis not present

## 2021-06-05 DIAGNOSIS — K5909 Other constipation: Secondary | ICD-10-CM | POA: Diagnosis not present

## 2021-06-05 DIAGNOSIS — E871 Hypo-osmolality and hyponatremia: Secondary | ICD-10-CM | POA: Diagnosis not present

## 2021-06-05 DIAGNOSIS — J9611 Chronic respiratory failure with hypoxia: Secondary | ICD-10-CM | POA: Diagnosis not present

## 2021-06-06 ENCOUNTER — Ambulatory Visit: Payer: Medicare Other | Attending: Infectious Diseases | Admitting: Infectious Diseases

## 2021-06-06 ENCOUNTER — Other Ambulatory Visit: Payer: Self-pay

## 2021-06-06 DIAGNOSIS — I5032 Chronic diastolic (congestive) heart failure: Secondary | ICD-10-CM | POA: Diagnosis not present

## 2021-06-06 DIAGNOSIS — R532 Functional quadriplegia: Secondary | ICD-10-CM | POA: Diagnosis not present

## 2021-06-06 DIAGNOSIS — J449 Chronic obstructive pulmonary disease, unspecified: Secondary | ICD-10-CM | POA: Diagnosis not present

## 2021-06-06 DIAGNOSIS — R7881 Bacteremia: Secondary | ICD-10-CM | POA: Diagnosis not present

## 2021-06-06 DIAGNOSIS — E785 Hyperlipidemia, unspecified: Secondary | ICD-10-CM | POA: Diagnosis not present

## 2021-06-06 DIAGNOSIS — E871 Hypo-osmolality and hyponatremia: Secondary | ICD-10-CM | POA: Diagnosis not present

## 2021-06-06 DIAGNOSIS — R1310 Dysphagia, unspecified: Secondary | ICD-10-CM | POA: Diagnosis not present

## 2021-06-06 DIAGNOSIS — J9611 Chronic respiratory failure with hypoxia: Secondary | ICD-10-CM | POA: Diagnosis not present

## 2021-06-06 DIAGNOSIS — N493 Fournier gangrene: Secondary | ICD-10-CM | POA: Diagnosis not present

## 2021-06-06 DIAGNOSIS — E872 Acidosis: Secondary | ICD-10-CM | POA: Diagnosis not present

## 2021-06-06 DIAGNOSIS — I110 Hypertensive heart disease with heart failure: Secondary | ICD-10-CM | POA: Diagnosis not present

## 2021-06-06 DIAGNOSIS — Z7901 Long term (current) use of anticoagulants: Secondary | ICD-10-CM | POA: Diagnosis not present

## 2021-06-06 DIAGNOSIS — A419 Sepsis, unspecified organism: Secondary | ICD-10-CM | POA: Diagnosis not present

## 2021-06-06 DIAGNOSIS — K5909 Other constipation: Secondary | ICD-10-CM | POA: Diagnosis not present

## 2021-06-06 DIAGNOSIS — N179 Acute kidney failure, unspecified: Secondary | ICD-10-CM | POA: Diagnosis not present

## 2021-06-06 DIAGNOSIS — D509 Iron deficiency anemia, unspecified: Secondary | ICD-10-CM | POA: Diagnosis not present

## 2021-06-06 DIAGNOSIS — I2510 Atherosclerotic heart disease of native coronary artery without angina pectoris: Secondary | ICD-10-CM | POA: Diagnosis not present

## 2021-06-06 NOTE — Progress Notes (Signed)
The purpose of this virtual visit is to provide medical care while limiting exposure to the novel coronavirus (COVID19) for both patient and office staff.   Consent was obtained for phone visit:  Yes.   Answered questions that patient had about telehealth interaction:  Yes.   I discussed the limitations, risks, security and privacy concerns of performing an evaluation and management service by telephone. I also discussed with the patient that there may be a patient responsible charge related to this service. The patient expressed understanding and agreed to proceed.   Patient Location: Home Provider Location: office Only patient and provider were in this phone call Triage done by CMA Clayborne Artist  Pt was recently in the hospital between 05/18/21-05/27/21 for fourniers gangrene of scrotum and sepsis and underwent extensive debridement on 6/9/2 and a few times following that. He had bacteroides in the culture and also had bacteroides bacteremia in the admission before that- He wa sinitally treated with IV zosyn and later switched to unasyn- He was discharged home on Augmentin until 06/08/21 Pt has not had a follow up with urologist His son does the dressing with help from [facility] twice a week- he says they come on Tuesday and Friday. Pt in unable to travel for visits and also cannot do video visit He has no fever, chills,  Had 3 loose stools today. Takes yogurt. 100% adherent to augmentin  Impression- fournier's gangrene Bacteroides bacteremia Plan Continue augmentin- may need for one more week but will decide once I see pictures Will ask his home health nurse to take pictures. Discussed the management with the patient- total time spent on the phone 11 min

## 2021-06-07 ENCOUNTER — Telehealth: Payer: Self-pay

## 2021-06-07 ENCOUNTER — Other Ambulatory Visit: Payer: Self-pay

## 2021-06-07 DIAGNOSIS — K5909 Other constipation: Secondary | ICD-10-CM | POA: Diagnosis not present

## 2021-06-07 DIAGNOSIS — E871 Hypo-osmolality and hyponatremia: Secondary | ICD-10-CM | POA: Diagnosis not present

## 2021-06-07 DIAGNOSIS — I2510 Atherosclerotic heart disease of native coronary artery without angina pectoris: Secondary | ICD-10-CM | POA: Diagnosis not present

## 2021-06-07 DIAGNOSIS — N179 Acute kidney failure, unspecified: Secondary | ICD-10-CM | POA: Diagnosis not present

## 2021-06-07 DIAGNOSIS — R1310 Dysphagia, unspecified: Secondary | ICD-10-CM | POA: Diagnosis not present

## 2021-06-07 DIAGNOSIS — E872 Acidosis: Secondary | ICD-10-CM | POA: Diagnosis not present

## 2021-06-07 DIAGNOSIS — J449 Chronic obstructive pulmonary disease, unspecified: Secondary | ICD-10-CM | POA: Diagnosis not present

## 2021-06-07 DIAGNOSIS — I5032 Chronic diastolic (congestive) heart failure: Secondary | ICD-10-CM | POA: Diagnosis not present

## 2021-06-07 DIAGNOSIS — E785 Hyperlipidemia, unspecified: Secondary | ICD-10-CM | POA: Diagnosis not present

## 2021-06-07 DIAGNOSIS — J9611 Chronic respiratory failure with hypoxia: Secondary | ICD-10-CM | POA: Diagnosis not present

## 2021-06-07 DIAGNOSIS — Z7901 Long term (current) use of anticoagulants: Secondary | ICD-10-CM | POA: Diagnosis not present

## 2021-06-07 DIAGNOSIS — A419 Sepsis, unspecified organism: Secondary | ICD-10-CM | POA: Diagnosis not present

## 2021-06-07 DIAGNOSIS — I110 Hypertensive heart disease with heart failure: Secondary | ICD-10-CM | POA: Diagnosis not present

## 2021-06-07 DIAGNOSIS — D509 Iron deficiency anemia, unspecified: Secondary | ICD-10-CM | POA: Diagnosis not present

## 2021-06-07 DIAGNOSIS — R532 Functional quadriplegia: Secondary | ICD-10-CM | POA: Diagnosis not present

## 2021-06-07 NOTE — Telephone Encounter (Signed)
-----   Message from Lynn Ito, MD sent at 06/06/2021  3:06 PM EDT ----- Pt says his home health nurse took a picture last week- if you can get that picture from them and upload in chart, it would help- If not they should take a picture the next time they are changing the dressing for him. Thx

## 2021-06-07 NOTE — Telephone Encounter (Signed)
Spoke to [employer] and they are faxing the pictures requested right now to [phone]

## 2021-06-07 NOTE — Telephone Encounter (Signed)
Cora at [employer] is [employer] that I will forward to Dr Rivka Safer.

## 2021-06-07 NOTE — Telephone Encounter (Signed)
Received pictures via email  from [facility] and forwarded these to Dr. Rivka Safer before deleting secure email.

## 2021-06-08 DIAGNOSIS — R1310 Dysphagia, unspecified: Secondary | ICD-10-CM | POA: Diagnosis not present

## 2021-06-08 DIAGNOSIS — D509 Iron deficiency anemia, unspecified: Secondary | ICD-10-CM | POA: Diagnosis not present

## 2021-06-08 DIAGNOSIS — I110 Hypertensive heart disease with heart failure: Secondary | ICD-10-CM | POA: Diagnosis not present

## 2021-06-08 DIAGNOSIS — E871 Hypo-osmolality and hyponatremia: Secondary | ICD-10-CM | POA: Diagnosis not present

## 2021-06-08 DIAGNOSIS — Z7901 Long term (current) use of anticoagulants: Secondary | ICD-10-CM | POA: Diagnosis not present

## 2021-06-08 DIAGNOSIS — R532 Functional quadriplegia: Secondary | ICD-10-CM | POA: Diagnosis not present

## 2021-06-08 DIAGNOSIS — A419 Sepsis, unspecified organism: Secondary | ICD-10-CM | POA: Diagnosis not present

## 2021-06-08 DIAGNOSIS — J449 Chronic obstructive pulmonary disease, unspecified: Secondary | ICD-10-CM | POA: Diagnosis not present

## 2021-06-08 DIAGNOSIS — I2510 Atherosclerotic heart disease of native coronary artery without angina pectoris: Secondary | ICD-10-CM | POA: Diagnosis not present

## 2021-06-08 DIAGNOSIS — K5909 Other constipation: Secondary | ICD-10-CM | POA: Diagnosis not present

## 2021-06-08 DIAGNOSIS — E785 Hyperlipidemia, unspecified: Secondary | ICD-10-CM | POA: Diagnosis not present

## 2021-06-08 DIAGNOSIS — E872 Acidosis: Secondary | ICD-10-CM | POA: Diagnosis not present

## 2021-06-08 DIAGNOSIS — J9601 Acute respiratory failure with hypoxia: Secondary | ICD-10-CM | POA: Diagnosis not present

## 2021-06-08 DIAGNOSIS — I5032 Chronic diastolic (congestive) heart failure: Secondary | ICD-10-CM | POA: Diagnosis not present

## 2021-06-08 DIAGNOSIS — J9611 Chronic respiratory failure with hypoxia: Secondary | ICD-10-CM | POA: Diagnosis not present

## 2021-06-08 DIAGNOSIS — N179 Acute kidney failure, unspecified: Secondary | ICD-10-CM | POA: Diagnosis not present

## 2021-06-09 ENCOUNTER — Other Ambulatory Visit: Payer: Self-pay | Admitting: Internal Medicine

## 2021-06-09 ENCOUNTER — Other Ambulatory Visit: Payer: Self-pay

## 2021-06-09 ENCOUNTER — Other Ambulatory Visit: Payer: Self-pay | Admitting: Infectious Diseases

## 2021-06-09 DIAGNOSIS — I499 Cardiac arrhythmia, unspecified: Secondary | ICD-10-CM | POA: Diagnosis not present

## 2021-06-09 DIAGNOSIS — I2510 Atherosclerotic heart disease of native coronary artery without angina pectoris: Secondary | ICD-10-CM

## 2021-06-09 DIAGNOSIS — N493 Fournier gangrene: Secondary | ICD-10-CM | POA: Diagnosis present

## 2021-06-09 DIAGNOSIS — Z79899 Other long term (current) drug therapy: Secondary | ICD-10-CM

## 2021-06-09 DIAGNOSIS — E878 Other disorders of electrolyte and fluid balance, not elsewhere classified: Secondary | ICD-10-CM | POA: Diagnosis present

## 2021-06-09 DIAGNOSIS — E119 Type 2 diabetes mellitus without complications: Secondary | ICD-10-CM | POA: Diagnosis not present

## 2021-06-09 DIAGNOSIS — E871 Hypo-osmolality and hyponatremia: Secondary | ICD-10-CM | POA: Diagnosis not present

## 2021-06-09 DIAGNOSIS — B351 Tinea unguium: Secondary | ICD-10-CM | POA: Diagnosis present

## 2021-06-09 DIAGNOSIS — N501 Vascular disorders of male genital organs: Secondary | ICD-10-CM | POA: Diagnosis not present

## 2021-06-09 DIAGNOSIS — R58 Hemorrhage, not elsewhere classified: Secondary | ICD-10-CM | POA: Diagnosis not present

## 2021-06-09 DIAGNOSIS — Y848 Other medical procedures as the cause of abnormal reaction of the patient, or of later complication, without mention of misadventure at the time of the procedure: Secondary | ICD-10-CM | POA: Diagnosis present

## 2021-06-09 DIAGNOSIS — F17210 Nicotine dependence, cigarettes, uncomplicated: Secondary | ICD-10-CM | POA: Diagnosis not present

## 2021-06-09 DIAGNOSIS — J449 Chronic obstructive pulmonary disease, unspecified: Secondary | ICD-10-CM | POA: Diagnosis not present

## 2021-06-09 DIAGNOSIS — I110 Hypertensive heart disease with heart failure: Secondary | ICD-10-CM | POA: Diagnosis present

## 2021-06-09 DIAGNOSIS — I959 Hypotension, unspecified: Secondary | ICD-10-CM | POA: Diagnosis not present

## 2021-06-09 DIAGNOSIS — I5042 Chronic combined systolic (congestive) and diastolic (congestive) heart failure: Secondary | ICD-10-CM | POA: Diagnosis present

## 2021-06-09 DIAGNOSIS — D72828 Other elevated white blood cell count: Secondary | ICD-10-CM | POA: Diagnosis present

## 2021-06-09 DIAGNOSIS — Z8616 Personal history of COVID-19: Secondary | ICD-10-CM

## 2021-06-09 DIAGNOSIS — K625 Hemorrhage of anus and rectum: Secondary | ICD-10-CM | POA: Diagnosis not present

## 2021-06-09 DIAGNOSIS — R532 Functional quadriplegia: Secondary | ICD-10-CM | POA: Diagnosis not present

## 2021-06-09 DIAGNOSIS — E785 Hyperlipidemia, unspecified: Secondary | ICD-10-CM | POA: Diagnosis present

## 2021-06-09 DIAGNOSIS — D6832 Hemorrhagic disorder due to extrinsic circulating anticoagulants: Secondary | ICD-10-CM | POA: Diagnosis present

## 2021-06-09 DIAGNOSIS — Z7901 Long term (current) use of anticoagulants: Secondary | ICD-10-CM

## 2021-06-09 DIAGNOSIS — K219 Gastro-esophageal reflux disease without esophagitis: Secondary | ICD-10-CM | POA: Diagnosis present

## 2021-06-09 DIAGNOSIS — N492 Inflammatory disorders of scrotum: Secondary | ICD-10-CM | POA: Diagnosis present

## 2021-06-09 DIAGNOSIS — Z8673 Personal history of transient ischemic attack (TIA), and cerebral infarction without residual deficits: Secondary | ICD-10-CM | POA: Diagnosis not present

## 2021-06-09 DIAGNOSIS — Z743 Need for continuous supervision: Secondary | ICD-10-CM | POA: Diagnosis not present

## 2021-06-09 DIAGNOSIS — T45515A Adverse effect of anticoagulants, initial encounter: Secondary | ICD-10-CM | POA: Diagnosis present

## 2021-06-09 DIAGNOSIS — Z86711 Personal history of pulmonary embolism: Secondary | ICD-10-CM | POA: Diagnosis not present

## 2021-06-09 DIAGNOSIS — Z6841 Body Mass Index (BMI) 40.0 and over, adult: Secondary | ICD-10-CM

## 2021-06-09 DIAGNOSIS — I10 Essential (primary) hypertension: Secondary | ICD-10-CM

## 2021-06-09 DIAGNOSIS — R52 Pain, unspecified: Secondary | ICD-10-CM | POA: Diagnosis not present

## 2021-06-09 DIAGNOSIS — T8141XA Infection following a procedure, superficial incisional surgical site, initial encounter: Principal | ICD-10-CM | POA: Diagnosis present

## 2021-06-09 DIAGNOSIS — R0902 Hypoxemia: Secondary | ICD-10-CM | POA: Diagnosis not present

## 2021-06-09 LAB — COMPREHENSIVE METABOLIC PANEL
ALT: 20 U/L (ref 0–44)
AST: 30 U/L (ref 15–41)
Albumin: 2.6 g/dL — ABNORMAL LOW (ref 3.5–5.0)
Alkaline Phosphatase: 92 U/L (ref 38–126)
Anion gap: 10 (ref 5–15)
BUN: 10 mg/dL (ref 8–23)
CO2: 27 mmol/L (ref 22–32)
Calcium: 8.5 mg/dL — ABNORMAL LOW (ref 8.9–10.3)
Chloride: 97 mmol/L — ABNORMAL LOW (ref 98–111)
Creatinine, Ser: 1.15 mg/dL (ref 0.61–1.24)
GFR, Estimated: >60 mL/min (ref >60)
Glucose, Bld: 157 mg/dL — ABNORMAL HIGH (ref 70–99)
Potassium: 3.6 mmol/L (ref 3.5–5.1)
Sodium: 134 mmol/L — ABNORMAL LOW (ref 135–145)
Total Bilirubin: 0.5 mg/dL (ref 0.3–1.2)
Total Protein: 6.5 g/dL (ref 6.5–8.1)

## 2021-06-09 LAB — CBC WITH DIFFERENTIAL/PLATELET
Abs Immature Granulocytes: 0.04 10*3/uL (ref 0.00–0.07)
Basophils Absolute: 0.1 10*3/uL (ref 0.0–0.1)
Basophils Relative: 1 %
Eosinophils Absolute: 0 10*3/uL (ref 0.0–0.5)
Eosinophils Relative: 0 %
HCT: 28.5 % — ABNORMAL LOW (ref 39.0–52.0)
Hemoglobin: 10.7 g/dL — ABNORMAL LOW (ref 13.0–17.0)
Immature Granulocytes: 0 %
Lymphocytes Relative: 8 %
Lymphs Abs: 0.9 10*3/uL (ref 0.7–4.0)
MCH: 35 pg — ABNORMAL HIGH (ref 26.0–34.0)
MCHC: 37.5 g/dL — ABNORMAL HIGH (ref 30.0–36.0)
MCV: 93.1 fL (ref 80.0–100.0)
Monocytes Absolute: 0.9 10*3/uL (ref 0.1–1.0)
Monocytes Relative: 8 %
Neutro Abs: 8.9 10*3/uL — ABNORMAL HIGH (ref 1.7–7.7)
Neutrophils Relative %: 83 %
Platelets: 406 10*3/uL — ABNORMAL HIGH (ref 150–400)
RBC: 3.06 MIL/uL — ABNORMAL LOW (ref 4.22–5.81)
RDW: 15.6 % — ABNORMAL HIGH (ref 11.5–15.5)
WBC: 10.8 10*3/uL — ABNORMAL HIGH (ref 4.0–10.5)
nRBC: 0 % (ref 0.0–0.2)

## 2021-06-09 LAB — LACTIC ACID, PLASMA: Lactic Acid, Venous: 1.9 mmol/L (ref 0.5–1.9)

## 2021-06-09 MED ORDER — ROSUVASTATIN CALCIUM 20 MG PO TABS: 20.0000 mg | ORAL_TABLET | Freq: Every day | ORAL | 1 refills | Status: DC

## 2021-06-09 MED ORDER — AMOXICILLIN-POT CLAVULANATE 875-125 MG PO TABS
1.0000 | ORAL_TABLET | Freq: Two times a day (BID) | ORAL | 0 refills | Status: DC
Start: 2021-06-09 — End: 2021-06-12

## 2021-06-09 NOTE — Progress Notes (Signed)
Sent a week of augmentin after reviewing pictures of Fournier's gangrene which is improving well

## 2021-06-09 NOTE — ED Notes (Signed)
Pt verbalizes understanding of MSE-waiver. 

## 2021-06-09 NOTE — ED Triage Notes (Addendum)
Pt states he feels like he has an icepick stabbing his testicles and watery bloody diarrhea. Pt states began today. Pt with recent fall today. Skin from scrotum is missing, pt states has been mission for three weeks. Pt has an indwelling foley cath. Pt states he fell today, "sitting down really hard" on buttocks.

## 2021-06-09 NOTE — ED Notes (Signed)
One set of cultures sent to the lab.

## 2021-06-10 ENCOUNTER — Inpatient Hospital Stay
Admission: EM | Admit: 2021-06-10 | Discharge: 2021-06-12 | DRG: 862 | Disposition: A | Discharge status: Home Health | Payer: Medicare Other | Attending: Internal Medicine | Admitting: Internal Medicine

## 2021-06-10 DIAGNOSIS — I110 Hypertensive heart disease with heart failure: Secondary | ICD-10-CM | POA: Diagnosis present

## 2021-06-10 DIAGNOSIS — R5381 Other malaise: Secondary | ICD-10-CM | POA: Diagnosis present

## 2021-06-10 DIAGNOSIS — R532 Functional quadriplegia: Secondary | ICD-10-CM | POA: Diagnosis present

## 2021-06-10 DIAGNOSIS — K219 Gastro-esophageal reflux disease without esophagitis: Secondary | ICD-10-CM | POA: Diagnosis present

## 2021-06-10 DIAGNOSIS — N452 Orchitis: Secondary | ICD-10-CM

## 2021-06-10 DIAGNOSIS — T8141XA Infection following a procedure, superficial incisional surgical site, initial encounter: Secondary | ICD-10-CM | POA: Diagnosis present

## 2021-06-10 DIAGNOSIS — Z6841 Body Mass Index (BMI) 40.0 and over, adult: Secondary | ICD-10-CM | POA: Diagnosis not present

## 2021-06-10 DIAGNOSIS — Z8673 Personal history of transient ischemic attack (TIA), and cerebral infarction without residual deficits: Secondary | ICD-10-CM | POA: Diagnosis not present

## 2021-06-10 DIAGNOSIS — Z86711 Personal history of pulmonary embolism: Secondary | ICD-10-CM

## 2021-06-10 DIAGNOSIS — T148XXA Other injury of unspecified body region, initial encounter: Secondary | ICD-10-CM

## 2021-06-10 DIAGNOSIS — I5032 Chronic diastolic (congestive) heart failure: Secondary | ICD-10-CM | POA: Diagnosis present

## 2021-06-10 DIAGNOSIS — I10 Essential (primary) hypertension: Secondary | ICD-10-CM | POA: Diagnosis not present

## 2021-06-10 DIAGNOSIS — E785 Hyperlipidemia, unspecified: Secondary | ICD-10-CM

## 2021-06-10 DIAGNOSIS — Z8616 Personal history of COVID-19: Secondary | ICD-10-CM | POA: Diagnosis not present

## 2021-06-10 DIAGNOSIS — E1169 Type 2 diabetes mellitus with other specified complication: Secondary | ICD-10-CM | POA: Diagnosis present

## 2021-06-10 DIAGNOSIS — N492 Inflammatory disorders of scrotum: Secondary | ICD-10-CM | POA: Diagnosis present

## 2021-06-10 DIAGNOSIS — F17210 Nicotine dependence, cigarettes, uncomplicated: Secondary | ICD-10-CM | POA: Diagnosis present

## 2021-06-10 DIAGNOSIS — E119 Type 2 diabetes mellitus without complications: Secondary | ICD-10-CM | POA: Diagnosis present

## 2021-06-10 DIAGNOSIS — I959 Hypotension, unspecified: Secondary | ICD-10-CM | POA: Diagnosis present

## 2021-06-10 DIAGNOSIS — I5042 Chronic combined systolic (congestive) and diastolic (congestive) heart failure: Secondary | ICD-10-CM | POA: Diagnosis present

## 2021-06-10 DIAGNOSIS — D72828 Other elevated white blood cell count: Secondary | ICD-10-CM | POA: Diagnosis present

## 2021-06-10 DIAGNOSIS — N501 Vascular disorders of male genital organs: Secondary | ICD-10-CM | POA: Diagnosis present

## 2021-06-10 DIAGNOSIS — Z743 Need for continuous supervision: Secondary | ICD-10-CM | POA: Diagnosis not present

## 2021-06-10 DIAGNOSIS — Z7901 Long term (current) use of anticoagulants: Secondary | ICD-10-CM | POA: Diagnosis not present

## 2021-06-10 DIAGNOSIS — B351 Tinea unguium: Secondary | ICD-10-CM | POA: Diagnosis present

## 2021-06-10 DIAGNOSIS — Y848 Other medical procedures as the cause of abnormal reaction of the patient, or of later complication, without mention of misadventure at the time of the procedure: Secondary | ICD-10-CM | POA: Diagnosis present

## 2021-06-10 DIAGNOSIS — Z79899 Other long term (current) drug therapy: Secondary | ICD-10-CM | POA: Diagnosis not present

## 2021-06-10 DIAGNOSIS — E878 Other disorders of electrolyte and fluid balance, not elsewhere classified: Secondary | ICD-10-CM | POA: Diagnosis present

## 2021-06-10 DIAGNOSIS — R0902 Hypoxemia: Secondary | ICD-10-CM | POA: Diagnosis not present

## 2021-06-10 DIAGNOSIS — N493 Fournier gangrene: Secondary | ICD-10-CM | POA: Diagnosis present

## 2021-06-10 DIAGNOSIS — L089 Local infection of the skin and subcutaneous tissue, unspecified: Secondary | ICD-10-CM

## 2021-06-10 DIAGNOSIS — E871 Hypo-osmolality and hyponatremia: Secondary | ICD-10-CM | POA: Diagnosis present

## 2021-06-10 DIAGNOSIS — R279 Unspecified lack of coordination: Secondary | ICD-10-CM | POA: Diagnosis not present

## 2021-06-10 DIAGNOSIS — J449 Chronic obstructive pulmonary disease, unspecified: Secondary | ICD-10-CM | POA: Diagnosis present

## 2021-06-10 DIAGNOSIS — D6832 Hemorrhagic disorder due to extrinsic circulating anticoagulants: Secondary | ICD-10-CM | POA: Diagnosis present

## 2021-06-10 DIAGNOSIS — I5033 Acute on chronic diastolic (congestive) heart failure: Secondary | ICD-10-CM | POA: Diagnosis present

## 2021-06-10 LAB — BASIC METABOLIC PANEL
Anion gap: 10 (ref 5–15)
BUN: 9 mg/dL (ref 8–23)
CO2: 27 mmol/L (ref 22–32)
Calcium: 8.4 mg/dL — ABNORMAL LOW (ref 8.9–10.3)
Chloride: 97 mmol/L — ABNORMAL LOW (ref 98–111)
Creatinine, Ser: 1.09 mg/dL (ref 0.61–1.24)
GFR, Estimated: >60 mL/min (ref >60)
Glucose, Bld: 97 mg/dL (ref 70–99)
Potassium: 3.8 mmol/L (ref 3.5–5.1)
Sodium: 134 mmol/L — ABNORMAL LOW (ref 135–145)

## 2021-06-10 LAB — RESP PANEL BY RT-PCR (FLU A&B, COVID) ARPGX2
Influenza A by PCR: NEGATIVE
Influenza B by PCR: NEGATIVE
SARS Coronavirus 2 by RT PCR: NEGATIVE

## 2021-06-10 LAB — CBC
HCT: 32.4 % — ABNORMAL LOW (ref 39.0–52.0)
Hemoglobin: 10.2 g/dL — ABNORMAL LOW (ref 13.0–17.0)
MCH: 28.7 pg (ref 26.0–34.0)
MCHC: 31.5 g/dL (ref 30.0–36.0)
MCV: 91 fL (ref 80.0–100.0)
Platelets: 368 10*3/uL (ref 150–400)
RBC: 3.56 MIL/uL — ABNORMAL LOW (ref 4.22–5.81)
RDW: 15.7 % — ABNORMAL HIGH (ref 11.5–15.5)
WBC: 10.6 10*3/uL — ABNORMAL HIGH (ref 4.0–10.5)
nRBC: 0 % (ref 0.0–0.2)

## 2021-06-10 LAB — URINALYSIS, COMPLETE (UACMP) WITH MICROSCOPIC
Bilirubin Urine: NEGATIVE
Glucose, UA: NEGATIVE mg/dL
Ketones, ur: 5 mg/dL — AB
Nitrite: POSITIVE — AB
Protein, ur: 30 mg/dL — AB
Specific Gravity, Urine: 1.013 (ref 1.005–1.030)
WBC, UA: >50 WBC/hpf — ABNORMAL HIGH (ref 0–5)
pH: 6 (ref 5.0–8.0)

## 2021-06-10 MED ORDER — ACETAMINOPHEN 325 MG PO TABS
650.0000 mg | ORAL_TABLET | Freq: Four times a day (QID) | ORAL | Status: DC | PRN
  Administered 2021-06-10 – 2021-06-12 (×3): 650 mg via ORAL
  Filled 2021-06-10 (×3): qty 2

## 2021-06-10 MED ORDER — ROSUVASTATIN CALCIUM 10 MG PO TABS
20.0000 mg | ORAL_TABLET | Freq: Every day | ORAL | Status: DC
  Administered 2021-06-10 – 2021-06-12 (×3): 20 mg via ORAL
  Filled 2021-06-10: qty 2
  Filled 2021-06-10 (×2): qty 1

## 2021-06-10 MED ORDER — CLINDAMYCIN PHOSPHATE 600 MG/50ML IV SOLN
600.0000 mg | Freq: Three times a day (TID) | INTRAVENOUS | Status: DC
  Filled 2021-06-10 (×2): qty 50

## 2021-06-10 MED ORDER — CLINDAMYCIN PHOSPHATE 600 MG/50ML IV SOLN
600.0000 mg | Freq: Once | INTRAVENOUS | Status: AC
  Administered 2021-06-10: 600 mg via INTRAVENOUS
  Filled 2021-06-10: qty 50

## 2021-06-10 MED ORDER — ACETAMINOPHEN 650 MG RE SUPP: 650.0000 mg | Freq: Four times a day (QID) | RECTAL | Status: DC | PRN

## 2021-06-10 MED ORDER — FENTANYL CITRATE (PF) 100 MCG/2ML IJ SOLN
25.0000 ug | INTRAMUSCULAR | Status: DC | PRN
  Administered 2021-06-10 – 2021-06-11 (×5): 25 ug via INTRAVENOUS
  Filled 2021-06-10 (×5): qty 2

## 2021-06-10 MED ORDER — SENNOSIDES-DOCUSATE SODIUM 8.6-50 MG PO TABS
1.0000 | ORAL_TABLET | Freq: Two times a day (BID) | ORAL | Status: DC
  Administered 2021-06-10 – 2021-06-12 (×4): 1 via ORAL
  Filled 2021-06-10 (×5): qty 1

## 2021-06-10 MED ORDER — ALBUTEROL SULFATE HFA 108 (90 BASE) MCG/ACT IN AERS: 2.0000 | INHALATION_SPRAY | Freq: Four times a day (QID) | RESPIRATORY_TRACT | Status: DC | PRN

## 2021-06-10 MED ORDER — PIPERACILLIN-TAZOBACTAM 3.375 G IVPB 30 MIN
3.3750 g | Freq: Once | INTRAVENOUS | Status: AC
  Administered 2021-06-10: 3.375 g via INTRAVENOUS
  Filled 2021-06-10: qty 50

## 2021-06-10 MED ORDER — FAMOTIDINE 20 MG PO TABS
20.0000 mg | ORAL_TABLET | Freq: Two times a day (BID) | ORAL | Status: DC
Start: 2021-06-10 — End: 2021-06-12
  Administered 2021-06-10 – 2021-06-12 (×5): 20 mg via ORAL
  Filled 2021-06-10 (×5): qty 1

## 2021-06-10 MED ORDER — ONDANSETRON HCL 4 MG/2ML IJ SOLN
4.0000 mg | Freq: Once | INTRAMUSCULAR | Status: AC
  Administered 2021-06-10: 4 mg via INTRAVENOUS
  Filled 2021-06-10: qty 2

## 2021-06-10 MED ORDER — PIPERACILLIN-TAZOBACTAM 3.375 G IVPB 30 MIN: 3.3750 g | Freq: Once | INTRAVENOUS | Status: DC

## 2021-06-10 MED ORDER — METOPROLOL SUCCINATE ER 50 MG PO TB24
50.0000 mg | ORAL_TABLET | Freq: Every day | ORAL | Status: DC
  Administered 2021-06-10 – 2021-06-12 (×3): 50 mg via ORAL
  Filled 2021-06-10 (×3): qty 1

## 2021-06-10 MED ORDER — FENTANYL CITRATE (PF) 100 MCG/2ML IJ SOLN
50.0000 ug | Freq: Once | INTRAMUSCULAR | Status: AC
  Administered 2021-06-10: 50 ug via INTRAVENOUS
  Filled 2021-06-10: qty 2

## 2021-06-10 MED ORDER — ONDANSETRON HCL 4 MG PO TABS: 4.0000 mg | ORAL_TABLET | Freq: Four times a day (QID) | ORAL | Status: DC | PRN

## 2021-06-10 MED ORDER — VANCOMYCIN HCL IN DEXTROSE 1-5 GM/200ML-% IV SOLN
1000.0000 mg | Freq: Once | INTRAVENOUS | Status: AC
  Administered 2021-06-10: 1000 mg via INTRAVENOUS
  Filled 2021-06-10: qty 200

## 2021-06-10 MED ORDER — MAGNESIUM HYDROXIDE 400 MG/5ML PO SUSP: 30.0000 mL | Freq: Every day | ORAL | Status: DC | PRN

## 2021-06-10 MED ORDER — SODIUM CHLORIDE 0.9 % IV SOLN: INTRAVENOUS | Status: DC

## 2021-06-10 MED ORDER — APIXABAN 5 MG PO TABS
5.0000 mg | ORAL_TABLET | Freq: Two times a day (BID) | ORAL | Status: DC
  Administered 2021-06-10 – 2021-06-12 (×5): 5 mg via ORAL
  Filled 2021-06-10 (×5): qty 1

## 2021-06-10 MED ORDER — TRAZODONE HCL 50 MG PO TABS
25.0000 mg | ORAL_TABLET | Freq: Every evening | ORAL | Status: DC | PRN
  Administered 2021-06-11: 25 mg via ORAL
  Filled 2021-06-10: qty 1

## 2021-06-10 MED ORDER — PIPERACILLIN-TAZOBACTAM 3.375 G IVPB
3.3750 g | Freq: Three times a day (TID) | INTRAVENOUS | Status: DC
  Administered 2021-06-10 (×2): 3.375 g via INTRAVENOUS
  Filled 2021-06-10 (×2): qty 50

## 2021-06-10 MED ORDER — ALBUTEROL SULFATE (2.5 MG/3ML) 0.083% IN NEBU: 2.5000 mg | INHALATION_SOLUTION | Freq: Four times a day (QID) | RESPIRATORY_TRACT | Status: DC | PRN

## 2021-06-10 MED ORDER — ONDANSETRON HCL 4 MG/2ML IJ SOLN: 4.0000 mg | Freq: Four times a day (QID) | INTRAMUSCULAR | Status: DC | PRN

## 2021-06-10 MED ORDER — POLYSACCHARIDE IRON COMPLEX 150 MG PO CAPS
150.0000 mg | ORAL_CAPSULE | Freq: Every day | ORAL | Status: DC
  Administered 2021-06-10 – 2021-06-12 (×3): 150 mg via ORAL
  Filled 2021-06-10 (×3): qty 1

## 2021-06-10 MED ORDER — VITAMIN B-12 1000 MCG PO TABS
1000.0000 ug | ORAL_TABLET | Freq: Every day | ORAL | Status: DC
  Administered 2021-06-10 – 2021-06-12 (×3): 1000 ug via ORAL
  Filled 2021-06-10 (×3): qty 1

## 2021-06-10 MED ORDER — VANCOMYCIN HCL 1250 MG/250ML IV SOLN
1250.0000 mg | Freq: Two times a day (BID) | INTRAVENOUS | Status: DC
  Administered 2021-06-10: 1250 mg via INTRAVENOUS
  Filled 2021-06-10 (×2): qty 250

## 2021-06-10 MED ORDER — PHENYLEPHRINE-MINERAL OIL-PET 0.25-14-74.9 % RE OINT
1.0000 "application " | TOPICAL_OINTMENT | Freq: Two times a day (BID) | RECTAL | Status: DC | PRN
  Filled 2021-06-10: qty 57

## 2021-06-10 MED ORDER — AMOXICILLIN-POT CLAVULANATE 875-125 MG PO TABS
1.0000 | ORAL_TABLET | Freq: Two times a day (BID) | ORAL | Status: DC
  Administered 2021-06-10 – 2021-06-12 (×4): 1 via ORAL
  Filled 2021-06-10 (×4): qty 1

## 2021-06-10 MED ORDER — CLINDAMYCIN PHOSPHATE 600 MG/50ML IV SOLN
600.0000 mg | Freq: Three times a day (TID) | INTRAVENOUS | Status: DC
  Administered 2021-06-10: 600 mg via INTRAVENOUS
  Filled 2021-06-10 (×4): qty 50

## 2021-06-10 MED ORDER — VANCOMYCIN HCL 1500 MG/300ML IV SOLN
1500.0000 mg | Freq: Once | INTRAVENOUS | Status: AC
  Administered 2021-06-10: 1500 mg via INTRAVENOUS
  Filled 2021-06-10: qty 300

## 2021-06-10 NOTE — H&P (Signed)
[facility]   PATIENT NAME: Thomas Tanner    MR#:  [id number]  DATE OF BIRTH:  1951-04-11  DATE OF ADMISSION:  06/10/2021  PRIMARY CARE PHYSICIAN: Lyndon Code, MD   Patient is coming from: Home  REQUESTING/REFERRING PHYSICIAN: Nita Sickle, MD  CHIEF COMPLAINT:   Chief Complaint  Patient presents with   Rectal Bleeding    HISTORY OF PRESENT ILLNESS:  Olusegun Gerstenberger is a 70 y.o. obese Caucasian male with medical history significant for CHF, COPD, type 2 diabetes mellitus, hypertension, dyslipidemia, TIA and osteoarthritis, who is status post debridement for Fournier's gangrene with exposed left testicle and presented to the emergency room with onset of bleeding from the left  testicle with purulent discharge.  He denies any fever or chills.  No nausea or vomiting or abdominal pain.  No chest pain or dyspnea or palpitations.  No dysuria, oliguria or hematuria or flank pain.  The patient had witnessed purulent discharge from his left testicle by the ER physician.  ED Course: When he came to the ER respiratory it was 22 and heart rate 92 with otherwise normal vital signs.  Labs revealed mild hyponatremia 134 and potassium of 3.6 with hypochloremia at 97.  Blood glucose was 157 and creatinine 1.15.  Albumin was 2.6.  Lactic acid was 1.9.  CBC showed minimal leukocytosis of 10.8 with neutrophilia as well as anemia and thrombocytosis.  Influenza antigens and COVID-19 PCR came back negative.  Urinalysis showed positive nitrite and more than 50 WBCs with many bacteria.  The patient was given IV vancomycin clindamycin and Zosyn as well as 50 mcg of IV fentanyl and 4 mg of IV Zofran.  He will be admitted to a medical bed for further evaluation and management. PAST MEDICAL HISTORY:   Past Medical History:  Diagnosis Date   Arthritis    CHF (congestive heart failure) (HCC)    COPD (chronic obstructive pulmonary disease) (HCC)    Diabetes (HCC)    Hyperlipidemia     Hypertension    Kidney stone    Obesity    Tachycardia    TIA (transient ischemic attack)   -History of pulmonary embolism  PAST SURGICAL HISTORY:   Past Surgical History:  Procedure Laterality Date   CORONARY/GRAFT ACUTE MI REVASCULARIZATION N/A 12/08/2018   Procedure: Coronary/Graft Acute MI Revascularization;  Surgeon: Alwyn Pea, MD;  Location: [facility];  Service: Cardiovascular;  Laterality: N/A;   INCISION AND DRAINAGE ABSCESS N/A 05/18/2021   Procedure: INCISION AND DRAINAGE SCROTAL ABSCESS, cystoscopy with urethral dilation, and complex catheter placement;  Surgeon: Sondra Come, MD;  Location: [facility];  Service: Urology;  Laterality: N/A;   IRRIGATION AND DEBRIDEMENT ABSCESS N/A 05/20/2021   Procedure: IRRIGATION AND DEBRIDEMENT ABSCESS;  Surgeon: Riki Altes, MD;  Location: [facility];  Service: Urology;  Laterality: N/A;   IRRIGATION AND DEBRIDEMENT ABSCESS N/A 05/21/2021   Procedure: IRRIGATION AND DEBRIDEMENT ABSCESS;  Surgeon: Riki Altes, MD;  Location: [facility];  Service: Urology;  Laterality: N/A;   KIDNEY STONE SURGERY     left arm surgery  1963   LEFT HEART CATH AND CORONARY ANGIOGRAPHY N/A 12/08/2018   Procedure: LEFT HEART CATH AND CORONARY ANGIOGRAPHY;  Surgeon: Alwyn Pea, MD;  Location: [facility];  Service: Cardiovascular;  Laterality: N/A;   PULMONARY THROMBECTOMY N/A 08/03/2020   Procedure: PULMONARY THROMBECTOMY / THROMBOLYSIS;  Surgeon: Annice Needy, MD;  Location: [facility];  Service: Cardiovascular;  Laterality: N/A;   RECTAL EXAM UNDER ANESTHESIA N/A 05/20/2021   Procedure: EXAM UNDER ANESTHESIA-Dressing and Change;  Surgeon: Riki Altes, MD;  Location: [facility];  Service: Urology;  Laterality: N/A;    SOCIAL HISTORY:   Social History   Tobacco Use   Smoking status: Every Day    Packs/day: 0.50    Pack years: 0.00    Types: Cigarettes    Last attempt to quit: 07/13/2020    Years since  quitting: 0.9   Smokeless tobacco: Never  Substance Use Topics   Alcohol use: No    FAMILY HISTORY:   Family History  Problem Relation Age of Onset   Alzheimer's disease Mother    CAD Father     DRUG ALLERGIES:   Allergies  Allergen Reactions   Morphine Anxiety and Other (See Comments)    Hallucinations  Pt states 6.17.22 that he is not allergic to this medication   Morphine And Related Anxiety    REVIEW OF SYSTEMS:   ROS As per history of present illness. All pertinent systems were reviewed above. Constitutional, HEENT, cardiovascular, respiratory, GI, GU, musculoskeletal, neuro, psychiatric, endocrine, integumentary and hematologic systems were reviewed and are otherwise negative/unremarkable except for positive findings mentioned above in the HPI.   MEDICATIONS AT HOME:   Prior to Admission medications   Medication Sig Start Date End Date Taking? Authorizing Provider  amoxicillin-clavulanate (AUGMENTIN) 875-125 MG tablet Take 1 tablet by mouth 2 (two) times daily. 06/09/21  Yes Lynn Ito, MD  apixaban (ELIQUIS) 5 MG TABS tablet Take 1 tablet (5 mg total) by mouth 2 (two) times daily. 08/16/20 06/10/21 Yes Theotis Burrow, NP  famotidine (PEPCID) 20 MG tablet Take 1 tablet (20 mg total) by mouth 2 (two) times daily. 08/30/20  Yes Boscia, Kathlynn Grate, NP  iron polysaccharides (NIFEREX) 150 MG capsule Take 1 capsule (150 mg total) by mouth daily. 05/27/21  Yes Marrion Coy, MD  metoprolol succinate (TOPROL-XL) 50 MG 24 hr tablet Take 1 tablet by mouth once daily 06/09/21  Yes Lyndon Code, MD  phenylephrine-shark liver oil-mineral oil-petrolatum (PREPARATION H) 0.25-14-74.9 % rectal ointment Place 1 application rectally 2 (two) times daily as needed for hemorrhoids. 05/16/21  Yes Sreenath, Sudheer B, MD  rosuvastatin (CRESTOR) 20 MG tablet Take 1 tablet (20 mg total) by mouth daily. 06/09/21  Yes Lyndon Code, MD  senna-docusate (SENOKOT-S) 8.6-50 MG tablet Take 1 tablet by  mouth 2 (two) times daily. 05/16/21  Yes Sreenath, Jonelle Sports, MD  VENTOLIN HFA 108 (90 Base) MCG/ACT inhaler Inhale 2 puffs into the lungs every 6 (six) hours as needed for wheezing or shortness of breath. 08/09/20  Yes Boscia, Kathlynn Grate, NP  vitamin B-12 1000 MCG tablet Take 1 tablet (1,000 mcg total) by mouth daily. 05/27/21  Yes Marrion Coy, MD  polyethylene glycol (MIRALAX / GLYCOLAX) 17 g packet Take 17 g by mouth daily. Patient not taking: Reported on 06/10/2021 05/17/21   Tresa Moore, MD      VITAL SIGNS:  Blood pressure (!) 137/97, pulse 87, temperature 97.9 F (36.6 C), temperature source Oral, resp. rate 18, height 6' (1.829 m), weight (!) 182 kg, SpO2 95 %.  PHYSICAL EXAMINATION:  Physical Exam  GENERAL:  70 y.o.-year-old morbidly obese Caucasian male patient lying in the bed with no acute distress.  EYES: Pupils equal, round, reactive to light and accommodation. No scleral icterus. Extraocular muscles intact.  HEENT: Head atraumatic, normocephalic. Oropharynx and nasopharynx clear.  NECK:  Supple, no jugular venous distention. No thyroid enlargement, no tenderness.  LUNGS: Normal breath sounds bilaterally, no wheezing, rales,rhonchi or crepitation. No use of accessory muscles of respiration.  CARDIOVASCULAR: Regular rate and rhythm, S1, S2 normal. No murmurs, rubs, or gallops.  ABDOMEN: Soft, nondistended, nontender. Bowel sounds present. No organomegaly or mass. GU: Left erythematous tender exposed testicle with minimal purulent discharge. EXTREMITIES: No pedal edema, cyanosis, or clubbing.  NEUROLOGIC: Cranial nerves II through XII are intact. Muscle strength 5/5 in all extremities. Sensation intact. Gait not checked.  PSYCHIATRIC: The patient is alert and oriented x 3.  Normal affect and good eye contact. SKIN: No obvious rash, lesion, or ulcer.   LABORATORY PANEL:   CBC Recent Labs  Lab 06/09/21 2046  WBC 10.8*  HGB 10.7*  HCT 28.5*  PLT 406*    ------------------------------------------------------------------------------------------------------------------  Chemistries  Recent Labs  Lab 06/09/21 2046  NA 134*  K 3.6  CL 97*  CO2 27  GLUCOSE 157*  BUN 10  CREATININE 1.15  CALCIUM 8.5*  AST 30  ALT 20  ALKPHOS 92  BILITOT 0.5   ------------------------------------------------------------------------------------------------------------------  Cardiac Enzymes No results for input(s): TROPONINI in the last 168 hours. ------------------------------------------------------------------------------------------------------------------  RADIOLOGY:  No results found.    IMPRESSION AND PLAN:  Active Problems:   Scrotal bleeding  1.  Left testicular bleeding and purulent discharge concerning for acute orchitis with subsequent mild sepsis as manifested by tachycardia and tachypnea.. - The patient will be admitted to a medical monitored bed. - We will continue antibiotic therapy with IV vancomycin, clindamycin and Zosyn for continued coverage for sequelae of Fournier's gangrene. - Pain management to be provided. - Urology consult will be obtained. - I notified Dr. Marlou Porch about the patient.  2.  History of saddle pulmonary embolism. - We will continue p.o. Eliquis.  3.  Essential hypertension and history of coronary artery disease. - We will continue Toprol-XL as well as statin therapy.  4.  Dyslipidemia. - Statin therapy will be resumed.  DVT prophylaxis: We will continue Eliquis. Code Status: full code.  Family Communication:  The plan of care was discussed in details with the patient (and no further members were at bedside). I answered all questions. The patient agreed to proceed with the above mentioned plan. Further management will depend upon hospital course. Disposition Plan: Back to previous home environment Consults called: Urology.  All the records are reviewed and case discussed with ED  provider.  Status is: Inpatient  Remains inpatient appropriate because:Ongoing diagnostic testing needed not appropriate for outpatient work up, Unsafe d/c plan, IV treatments appropriate due to intensity of illness or inability to take PO, and Inpatient level of care appropriate due to severity of illness  Dispo: The patient is from: Home              Anticipated d/c is to: Home              Patient currently is not medically stable to d/c.   Difficult to place patient No   TOTAL TIME TAKING CARE OF THIS PATIENT: 55 minutes.    Hannah Beat M.D on 06/10/2021 at 3:20 AM  [facility]   From 7 PM-7 AM, contact night-coverage [email or web address]  CC: Primary care physician; Lyndon Code, MD

## 2021-06-10 NOTE — ED Notes (Addendum)
Pt continues on 3 liter's [place] on assumption of care.

## 2021-06-10 NOTE — ED Notes (Signed)
Pt given additional warm blankets  

## 2021-06-10 NOTE — ED Provider Notes (Signed)
[facility] Emergency Department Provider Note  ____________________________________________  Time seen: Approximately 2:56 AM  I have reviewed the triage vital signs and the nursing notes.   HISTORY  Chief Complaint Rectal Bleeding   HPI Thomas Tanner is a 70 y.o. male with history of morbid obese,, diabetes, hypertension, hyperlipidemia, CHF, COPD, Fournier's gangrene status post surgical debridement a month ago who presents for evaluation of pain in his scrotum, bleeding, and purulent discharge.  Patient reports that his symptoms started today.  He noticed blood streaking into the wrap around his scrotum.  He is also complaining of intermittent severe stabbing pain which started this evening.  He also noticed some pus on the wrap.  Denies any fever or chills, abdominal pain, nausea or vomiting.   Past Medical History:  Diagnosis Date   Arthritis    CHF (congestive heart failure) (HCC)    COPD (chronic obstructive pulmonary disease) (HCC)    Diabetes (HCC)    Hyperlipidemia    Hypertension    Kidney stone    Obesity    Tachycardia    TIA (transient ischemic attack)     Patient Active Problem List   Diagnosis Date Noted   Abscess 05/18/2021   Fournier's gangrene of scrotum 05/18/2021   Fournier gangrene 05/18/2021   Sepsis (HCC) 05/18/2021   Functional quadriplegia (HCC) 05/18/2021   Bacteremia due to Gram-negative bacteria 05/07/2021   Constipated 05/06/2021   Physical deconditioning 05/06/2021   Atherosclerosis of aorta (HCC) 09/28/2020   Acute on chronic heart failure (HCC) 08/02/2020   Acute saddle pulmonary embolism (HCC) 08/02/2020   Hypoxia 08/01/2020   Weakness 08/01/2020   Abnormality of gait and mobility 08/01/2020   AKI (acute kidney injury) (HCC) 07/21/2020   Hypotension 07/20/2020   Leukocytosis 07/20/2020   Cellulitis of right lower extremity 07/20/2020   Pressure injury of skin 07/12/2020   Acute respiratory  failure with hypoxia (HCC) 07/10/2020   COPD with acute exacerbation (HCC)    Elevated troponin    Chronic diastolic CHF (congestive heart failure) (HCC) 05/24/2020   Anasarca    Acute renal failure superimposed on stage 3a chronic kidney disease (HCC)    Hyperlipidemia    Tobacco abuse    Shortness of breath 04/28/2020   Upper respiratory tract infection due to COVID-19 virus 04/28/2020   Edema 04/28/2020   Encounter for general adult medical examination with abnormal findings 01/15/2019   Acute non-recurrent maxillary sinusitis 01/15/2019   Gastroesophageal reflux disease without esophagitis 01/15/2019   CAD in native artery 12/16/2018   Morbid obesity with BMI of 50.0-59.9, adult (HCC) 12/16/2018   Cigarette smoker motivated to quit 12/16/2018   Chronic pain of right knee 08/04/2018   Mild intermittent asthma 08/04/2018   Impaired fasting glucose 04/03/2018   Low back pain with right-sided sciatica 03/05/2018   Chronic right-sided low back pain with right-sided sciatica 03/05/2018   Pain in right hip 03/05/2018   Hypertension 03/05/2018   Foreign body in bladder and urethra 06/09/2013   Kidney stone 06/09/2013   Ureteric stone 06/09/2013   Kidney stone 06/09/2013    Past Surgical History:  Procedure Laterality Date   CORONARY/GRAFT ACUTE MI REVASCULARIZATION N/A 12/08/2018   Procedure: Coronary/Graft Acute MI Revascularization;  Surgeon: Alwyn Peaallwood, Dwayne D, MD;  Location: [facility];  Service: Cardiovascular;  Laterality: N/A;   INCISION AND DRAINAGE ABSCESS N/A 05/18/2021   Procedure: INCISION AND DRAINAGE SCROTAL ABSCESS, cystoscopy with urethral dilation, and complex catheter placement;  Surgeon:  Sondra Come, MD;  Location: [facility];  Service: Urology;  Laterality: N/A;   IRRIGATION AND DEBRIDEMENT ABSCESS N/A 05/20/2021   Procedure: IRRIGATION AND DEBRIDEMENT ABSCESS;  Surgeon: Riki Altes, MD;  Location: [facility];  Service: Urology;  Laterality: N/A;    IRRIGATION AND DEBRIDEMENT ABSCESS N/A 05/21/2021   Procedure: IRRIGATION AND DEBRIDEMENT ABSCESS;  Surgeon: Riki Altes, MD;  Location: [facility];  Service: Urology;  Laterality: N/A;   KIDNEY STONE SURGERY     left arm surgery  1963   LEFT HEART CATH AND CORONARY ANGIOGRAPHY N/A 12/08/2018   Procedure: LEFT HEART CATH AND CORONARY ANGIOGRAPHY;  Surgeon: Alwyn Pea, MD;  Location: [facility];  Service: Cardiovascular;  Laterality: N/A;   PULMONARY THROMBECTOMY N/A 08/03/2020   Procedure: PULMONARY THROMBECTOMY / THROMBOLYSIS;  Surgeon: Annice Needy, MD;  Location: [facility];  Service: Cardiovascular;  Laterality: N/A;   RECTAL EXAM UNDER ANESTHESIA N/A 05/20/2021   Procedure: EXAM UNDER ANESTHESIA-Dressing and Change;  Surgeon: Riki Altes, MD;  Location: [facility];  Service: Urology;  Laterality: N/A;    Prior to Admission medications   Medication Sig Start Date End Date Taking? Authorizing Provider  amoxicillin-clavulanate (AUGMENTIN) 875-125 MG tablet Take 1 tablet by mouth 2 (two) times daily. 06/09/21  Yes Lynn Ito, MD  apixaban (ELIQUIS) 5 MG TABS tablet Take 1 tablet (5 mg total) by mouth 2 (two) times daily. 08/16/20 06/10/21 Yes Theotis Burrow, NP  famotidine (PEPCID) 20 MG tablet Take 1 tablet (20 mg total) by mouth 2 (two) times daily. 08/30/20  Yes Boscia, Kathlynn Grate, NP  iron polysaccharides (NIFEREX) 150 MG capsule Take 1 capsule (150 mg total) by mouth daily. 05/27/21  Yes Marrion Coy, MD  metoprolol succinate (TOPROL-XL) 50 MG 24 hr tablet Take 1 tablet by mouth once daily 06/09/21  Yes Lyndon Code, MD  phenylephrine-shark liver oil-mineral oil-petrolatum (PREPARATION H) 0.25-14-74.9 % rectal ointment Place 1 application rectally 2 (two) times daily as needed for hemorrhoids. 05/16/21  Yes Sreenath, Sudheer B, MD  rosuvastatin (CRESTOR) 20 MG tablet Take 1 tablet (20 mg total) by mouth daily. 06/09/21  Yes Lyndon Code, MD  senna-docusate  (SENOKOT-S) 8.6-50 MG tablet Take 1 tablet by mouth 2 (two) times daily. 05/16/21  Yes Sreenath, Jonelle Sports, MD  VENTOLIN HFA 108 (90 Base) MCG/ACT inhaler Inhale 2 puffs into the lungs every 6 (six) hours as needed for wheezing or shortness of breath. 08/09/20  Yes Boscia, Kathlynn Grate, NP  vitamin B-12 1000 MCG tablet Take 1 tablet (1,000 mcg total) by mouth daily. 05/27/21  Yes Marrion Coy, MD  polyethylene glycol (MIRALAX / GLYCOLAX) 17 g packet Take 17 g by mouth daily. Patient not taking: Reported on 06/10/2021 05/17/21   Tresa Moore, MD    Allergies Morphine and Morphine and related  Family History  Problem Relation Age of Onset   Alzheimer's disease Mother    CAD Father     Social History Social History   Tobacco Use   Smoking status: Every Day    Packs/day: 0.50    Pack years: 0.00    Types: Cigarettes    Last attempt to quit: 07/13/2020    Years since quitting: 0.9   Smokeless tobacco: Never  Substance Use Topics   Alcohol use: No   Drug use: No    Review of Systems  Constitutional: Negative for fever. Eyes: Negative for visual changes. ENT: Negative for sore throat.  Neck: No neck pain  Cardiovascular: Negative for chest pain. Respiratory: Negative for shortness of breath. Gastrointestinal: Negative for abdominal pain, vomiting or diarrhea. Genitourinary: Negative for dysuria. + scrotum pain Musculoskeletal: Negative for back pain. Skin: Negative for rash. Neurological: Negative for headaches, weakness or numbness. Psych: No SI or HI  ____________________________________________   PHYSICAL EXAM:  VITAL SIGNS: ED Triage Vitals  Enc Vitals Group     BP 06/09/21 2038 131/68     Pulse Rate 06/09/21 2038 92     Resp 06/09/21 2038 (!) 22     Temp 06/09/21 2038 98.6 F (37 C)     Temp Source 06/09/21 2038 Oral     SpO2 06/09/21 2038 94 %     Weight 06/09/21 2037 (!) 401 lb 3.8 oz (182 kg)     Height 06/09/21 2037 6' (1.829 m)     Head Circumference --       Peak Flow --      Pain Score 06/09/21 2037 10     Pain Loc --      Pain Edu? --      Excl. in GC? --     Constitutional: Alert and oriented. Well appearing and in no apparent distress. HEENT:      Head: Normocephalic and atraumatic.         Eyes: Conjunctivae are normal. Sclera is non-icteric.       Mouth/Throat: Mucous membranes are moist.       Neck: Supple with no signs of meningismus. Cardiovascular: Regular rate and rhythm. No murmurs, gallops, or rubs.  Respiratory: Normal respiratory effort. Lungs are clear to auscultation bilaterally.  Gastrointestinal: Soft, non tender, and non distended Genitourinary: Copious amount of purulent bloody discharge from the exposed testicle, no bulla, no crepitus. Scrotum skin has no warmth or erythema Musculoskeletal:  No edema, cyanosis, or erythema of extremities. Neurologic: Normal speech and language. Face is symmetric. Moving all extremities. No gross focal neurologic deficits are appreciated. Skin: Skin is warm, dry and intact. No rash noted. Psychiatric: Mood and affect are normal. Speech and behavior are normal.  ____________________________________________   LABS (all labs ordered are listed, but only abnormal results are displayed)  Labs Reviewed  COMPREHENSIVE METABOLIC PANEL - Abnormal; Notable for the following components:      Result Value   Sodium 134 (*)    Chloride 97 (*)    Glucose, Bld 157 (*)    Calcium 8.5 (*)    Albumin 2.6 (*)    All other components within normal limits  CBC WITH DIFFERENTIAL/PLATELET - Abnormal; Notable for the following components:   WBC 10.8 (*)    RBC 3.06 (*)    Hemoglobin 10.7 (*)    HCT 28.5 (*)    MCH 35.0 (*)    MCHC 37.5 (*)    RDW 15.6 (*)    Platelets 406 (*)    Neutro Abs 8.9 (*)    All other components within normal limits  RESP PANEL BY RT-PCR (FLU A&B, COVID) ARPGX2  BODY FLUID CULTURE W GRAM STAIN  LACTIC ACID, PLASMA  LACTIC ACID, PLASMA  URINALYSIS, COMPLETE  (UACMP) WITH MICROSCOPIC   ____________________________________________  EKG   none ____________________________________________  RADIOLOGY  none  ____________________________________________   PROCEDURES  Procedure(s) performed: None Procedures Critical Care performed:  None ____________________________________________   INITIAL IMPRESSION / ASSESSMENT AND PLAN / ED COURSE  70 y.o. male with history of morbid obese, diabetes, hypertension, hyperlipidemia, CHF, COPD, Fournier's gangrene status post surgical  debridement a month ago who presents for evaluation of pain in his scrotum, bleeding, and purulent discharge.  On exam patient has exposed testicle with copious amount of pus and very small amount of blood coming from it.  The skin of the perineum and the scrotum are not indurated, warmth, or red.  There are no signs of sepsis with no fever, no tachycardia, and a normal lactic acid.  He does have mildly elevated white count of 10.8 and mildly elevated platelets of 406.  Broadly with clinda, Zosyn, and Vanco.  We will send the purulent discharge for culture.  Discussed with the hospitalist for admission.      _____________________________________________ Please note:  Patient was evaluated in Emergency Department today for the symptoms described in the history of present illness. Patient was evaluated in the context of the global COVID-19 pandemic, which necessitated consideration that the patient might be at risk for infection with the SARS-CoV-2 virus that causes COVID-19. Institutional protocols and algorithms that pertain to the evaluation of patients at risk for COVID-19 are in a state of rapid change based on information released by regulatory bodies including the CDC and federal and state organizations. These policies and algorithms were followed during the patient's care in the ED.  Some ED evaluations and interventions may be delayed as a result of limited staffing during  the pandemic.   [place] Controlled Substance Database was reviewed by me. ____________________________________________   FINAL CLINICAL IMPRESSION(S) / ED DIAGNOSES   Final diagnoses:  Wound infection      NEW MEDICATIONS STARTED DURING THIS VISIT:  ED Discharge Orders     None        Note:  This document was prepared using Dragon voice recognition software and may include unintentional dictation errors.    Nita Sickle, MD 06/10/21 [phone]

## 2021-06-10 NOTE — ED Notes (Signed)
Lab at bedside at this time.  

## 2021-06-10 NOTE — ED Notes (Signed)
Pt O2 saturation decreased to 87% on RA. Pt placed on 3L [place]. Per pt he is suppose to be on O2 for COPD. Pt O2 saturation 98%.

## 2021-06-10 NOTE — ED Notes (Signed)
ED Provider at bedside. 

## 2021-06-10 NOTE — Hospital Course (Signed)
70 y.o. obese Caucasian male with medical history significant for CHF, COPD, type 2 diabetes mellitus, hypertension, dyslipidemia, TIA and osteoarthritis, who is status post debridement for Fournier's gangrene with exposed left testicle and presented to the emergency room with onset of bleeding from the left  testicle with purulent discharge.  Admitted to [facility] service with urology consulted.  Started on broad spectrum IV antibiotics (Vanc, Zosyn, Clinda).

## 2021-06-10 NOTE — ED Notes (Signed)
Dr. Arville Care to bedside.

## 2021-06-10 NOTE — Progress Notes (Addendum)
PROGRESS NOTE    Thomas Tanner   GGY:[id number]  DOB: 1950-12-25  PCP: Lyndon Code, MD    DOA: 06/10/2021 LOS: 0   Assessment & Plan   Active Problems:   Scrotal bleeding   Recent Fournier's gangrene -recent course as above and urology consult note.  Apparently difficulty with wound care at home.  Patient declined SNF/discharge.  Urology does not feel there is persistent or worsening infection at this time, suspect superficial infection in the wound bed and bleeding secondary to anticoagulation. --Urology consulted, changed to dressings at bedside 7/2 -see their note for wound care suggestions --TOC consulted to arrange home health --Dressing changes twice daily while admitted, once daily at home --d/c broad spectrum IV antibiotics & monitor closely --resume Augmentin (ID had prescribed 1 wk course on 7/1 based on HH's concerns) --Follow-up wound culture obtained 7/2 --Adequate home wound care is absolutely essential -- Maintain Foley catheter, to be changed q 4 weeks  Abnormal Urinanalysis - pt does not complain of urinary symptoms; has indwelling Foley; afebrile.  In setting of recent history, suspect contamination.  Getting antibiotic 5 more days as above.  Defer urine culture as very low clinical suspicion for UTI at this time.  Monitor clinically.  Hx of Saddle Pulmonary Embolism - continue Eliquis.  Essential HTN, Hx of CAD - continue Toprol-XL, statin  Hyperlipidemia - statin  Morbid Obesity: Body mass index is 54.42 kg/m.  Complicates overall care and prognosis.  Recommend lifestyle modifications including physical activity and diet for weight loss and overall long-term health.   DVT prophylaxis:  apixaban (ELIQUIS) tablet 5 mg   Diet:  Diet Orders (From admission, onward)     Start     Ordered   06/10/21 0317  Diet NPO time specified Except for: Sips with Meds  Diet effective now       Question:  Except for  Answer:  Clearance Coots with Meds   06/10/21 0350               Code Status: Full Code   Brief Narrative / Hospital Course to Date:   70 y.o. obese Caucasian male with medical history significant for CHF, COPD, type 2 diabetes mellitus, hypertension, dyslipidemia, TIA and osteoarthritis, who is status post debridement for Fournier's gangrene with exposed left testicle and presented to the emergency room with onset of bleeding from the left  testicle with purulent discharge.  Admitted to [facility] service with urology consulted.  Started on broad spectrum IV antibiotics (Vanc, Zosyn, Clinda).   Subjective 06/10/21    Pt seen in the ED holding for a bed. Denies fever or chills.  Reports feeling SOB at times,but okay right now.  Scrotum dressings changed earlier.  Asks for hospital bed for comfort.   Disposition Plan & Communication   Status is: Inpatient  Remains inpatient appropriate because: requiring IV medication for pain control; cultures pending  Dispo: The patient is from: Home              Anticipated d/c is to: Home              Patient currently is not medically stable to d/c.   Difficult to place patient No   Consults, Procedures, Significant Events   Consultants:  Urology  Procedures:  None  Antimicrobials:  Anti-infectives (From admission, onward)    Start     Dose/Rate Route Frequency Ordered Stop   06/10/21 1800  vancomycin (VANCOREADY) IVPB 1250 mg/250 mL  1,250 mg 166.7 mL/hr over 90 Minutes Intravenous Every 12 hours 06/10/21 0519     06/10/21 1100  clindamycin (CLEOCIN) IVPB 600 mg        600 mg 100 mL/hr over 30 Minutes Intravenous Every 8 hours 06/10/21 0523 06/13/21 1359   06/10/21 0700  piperacillin-tazobactam (ZOSYN) IVPB 3.375 g        3.375 g 12.5 mL/hr over 240 Minutes Intravenous Every 8 hours 06/10/21 0439     06/10/21 0600  clindamycin (CLEOCIN) IVPB 600 mg  Status:  Discontinued        600 mg 100 mL/hr over 30 Minutes Intravenous Every 8 hours 06/10/21 0317 06/10/21 0523   06/10/21  0530  vancomycin (VANCOREADY) IVPB 1500 mg/300 mL        1,500 mg 150 mL/hr over 120 Minutes Intravenous  Once 06/10/21 0317     06/10/21 0330  piperacillin-tazobactam (ZOSYN) IVPB 3.375 g  Status:  Discontinued        3.375 g 100 mL/hr over 30 Minutes Intravenous  Once 06/10/21 0317 06/10/21 0437   06/10/21 0200  piperacillin-tazobactam (ZOSYN) IVPB 3.375 g        3.375 g 100 mL/hr over 30 Minutes Intravenous  Once 06/10/21 0154 06/10/21 0304   06/10/21 0200  vancomycin (VANCOCIN) IVPB 1000 mg/200 mL premix        1,000 mg 200 mL/hr over 60 Minutes Intravenous  Once 06/10/21 0154 06/10/21 0455   06/10/21 0200  clindamycin (CLEOCIN) IVPB 600 mg        600 mg 100 mL/hr over 30 Minutes Intravenous  Once 06/10/21 0154 06/10/21 0410         Micro    Objective   Vitals:   06/10/21 0201 06/10/21 0400 06/10/21 0500 06/10/21 0600  BP: (!) 137/97 (!) 103/51 (!) 119/52 140/80  Pulse: 87 84 86 87  Resp: 18   18  Temp: 97.9 F (36.6 C)     TempSrc: Oral     SpO2: 95% 94% 98% 97%  Weight:      Height:       No intake or output data in the 24 hours ending 06/10/21 0801 Filed Weights   06/09/21 2037  Weight: (!) 182 kg    Physical Exam:  General exam: awake, alert, no acute distress, morbidly obese, chronically ill-appearing, disheveled HEENT: moist mucus membranes, hearing grossly normal  Respiratory system: CTAB diminished due to body habitus, no wheezes, rales or rhonchi, normal respiratory effort. Cardiovascular system: normal S1/S2, RRR, no pedal edema.   Gastrointestinal system: Protuberant abdomen nontender, +bowel sounds. Central nervous system: A&O x 3. no gross focal neurologic deficits, normal speech Extremities: Moves all, no edema, normal tone, onychomycosis, overgrown toenails and fingernails appears dirty underneath fingernails Psychiatry: normal mood, congruent affect, judgement and insight appear normal  Labs   Data Reviewed: I have personally reviewed  following labs and imaging studies  CBC: Recent Labs  Lab 06/09/21 2046  WBC 10.8*  NEUTROABS 8.9*  HGB 10.7*  HCT 28.5*  MCV 93.1  PLT 406*   Basic Metabolic Panel: Recent Labs  Lab 06/09/21 2046  NA 134*  K 3.6  CL 97*  CO2 27  GLUCOSE 157*  BUN 10  CREATININE 1.15  CALCIUM 8.5*   GFR: Estimated Creatinine Clearance: 103.8 mL/min (by C-G formula based on SCr of 1.15 mg/dL). Liver Function Tests: Recent Labs  Lab 06/09/21 2046  AST 30  ALT 20  ALKPHOS 92  BILITOT 0.5  PROT 6.5  ALBUMIN 2.6*   No results for input(s): LIPASE, AMYLASE in the last 168 hours. No results for input(s): AMMONIA in the last 168 hours. Coagulation Profile: No results for input(s): INR, PROTIME in the last 168 hours. Cardiac Enzymes: No results for input(s): CKTOTAL, CKMB, CKMBINDEX, TROPONINI in the last 168 hours. BNP (last 3 results) No results for input(s): PROBNP in the last 8760 hours. HbA1C: No results for input(s): HGBA1C in the last 72 hours. CBG: No results for input(s): GLUCAP in the last 168 hours. Lipid Profile: No results for input(s): CHOL, HDL, LDLCALC, TRIG, CHOLHDL, LDLDIRECT in the last 72 hours. Thyroid Function Tests: No results for input(s): TSH, T4TOTAL, FREET4, T3FREE, THYROIDAB in the last 72 hours. Anemia Panel: No results for input(s): VITAMINB12, FOLATE, FERRITIN, TIBC, IRON, RETICCTPCT in the last 72 hours. Sepsis Labs: Recent Labs  Lab 06/09/21 2046  LATICACIDVEN 1.9    Recent Results (from the past 240 hour(s))  Resp Panel by RT-PCR (Flu A&B, Covid) Nasopharyngeal Swab     Status: None   Collection Time: 06/10/21  2:04 AM   Specimen: Nasopharyngeal Swab; Nasopharyngeal(NP) swabs in vial transport medium  Result Value Ref Range Status   SARS Coronavirus 2 by RT PCR NEGATIVE NEGATIVE Final    Comment: (NOTE) SARS-CoV-2 target nucleic acids are NOT DETECTED.  The SARS-CoV-2 RNA is generally detectable in upper respiratory specimens during  the acute phase of infection. The lowest concentration of SARS-CoV-2 viral copies this assay can detect is 138 copies/mL. A negative result does not preclude SARS-Cov-2 infection and should not be used as the sole basis for treatment or other patient management decisions. A negative result may occur with  improper specimen collection/handling, submission of specimen other than nasopharyngeal swab, presence of viral mutation(s) within the areas targeted by this assay, and inadequate number of viral copies(<138 copies/mL). A negative result must be combined with clinical observations, patient history, and epidemiological information. The expected result is Negative.  Fact Sheet for Patients:  BloggerCourse.com  Fact Sheet for Healthcare Providers:  SeriousBroker.it  This test is no t yet approved or cleared by the [place] FDA and  has been authorized for detection and/or diagnosis of SARS-CoV-2 by FDA under an Emergency Use Authorization (EUA). This EUA will remain  in effect (meaning this test can be used) for the duration of the COVID-19 declaration under Section 564(b)(1) of the Act, 21 U.S.C.section 360bbb-3(b)(1), unless the authorization is terminated  or revoked sooner.       Influenza A by PCR NEGATIVE NEGATIVE Final   Influenza B by PCR NEGATIVE NEGATIVE Final    Comment: (NOTE) The Xpert Xpress SARS-CoV-2/FLU/RSV plus assay is intended as an aid in the diagnosis of influenza from Nasopharyngeal swab specimens and should not be used as a sole basis for treatment. Nasal washings and aspirates are unacceptable for Xpert Xpress SARS-CoV-2/FLU/RSV testing.  Fact Sheet for Patients: BloggerCourse.com  Fact Sheet for Healthcare Providers: SeriousBroker.it  This test is not yet approved or cleared by the [place] FDA and has been authorized for detection and/or  diagnosis of SARS-CoV-2 by FDA under an Emergency Use Authorization (EUA). This EUA will remain in effect (meaning this test can be used) for the duration of the COVID-19 declaration under Section 564(b)(1) of the Act, 21 U.S.C. section 360bbb-3(b)(1), unless the authorization is terminated or revoked.  Performed at [facility], [address], [place], [place] [address]       Imaging Studies   No results found.  Medications   Scheduled Meds:  apixaban  5 mg Oral BID   famotidine  20 mg Oral BID   iron polysaccharides  150 mg Oral Daily   metoprolol succinate  50 mg Oral Daily   rosuvastatin  20 mg Oral Daily   senna-docusate  1 tablet Oral BID   cyanocobalamin  1,000 mcg Oral Daily   Continuous Infusions:  sodium chloride 100 mL/hr at 06/10/21 0607   clindamycin (CLEOCIN) IV     piperacillin-tazobactam (ZOSYN)  IV     vancomycin     vancomycin 1,500 mg (06/10/21 0606)       LOS: 0 days    Time spent: 30 minutes    Pennie Banter, DO [facility]  06/10/2021, 8:01 AM      If 7PM-7AM, please contact night-coverage. How to contact the [facility] Attending or Consulting provider 7A - 7P or covering provider during after hours 7P -7A, for this patient?    Check the care team in [facility] and look for a) attending/consulting [facility] provider listed and b) the [facility] team listed Log into [email or web address] and use [facility]'s universal password to access. If you do not have the password, please contact the hospital operator. Locate the [facility] provider you are looking for under [facility] and page to a number that you can be directly reached. If you still have difficulty reaching the provider, please page the [facility] (Director on Call) for the Hospitalists listed on amion for assistance.

## 2021-06-10 NOTE — Progress Notes (Signed)
Pharmacy Antibiotic Note  Thomas Tanner is a 70 y.o. male admitted on 06/10/2021 with cellulitis  (covering for Fournier's gangrene). Pharmacy has been consulted for Zosyn and Vancomycin dosing.  Plan: Zosyn 3.375g IV q8h (4 hour infusion).  Vancomycin Pt ordered total initial dose per pt wt: 182 kg.  Vancomycin 1250 mg IV Q 12 hrs. Goal AUC 400-550. Expected AUC: 454.8 SCr used: 1.15, Vd used: 0.5  Pharmacy will continue to follow and adjust abx dosing whenever warranted.  Height: 6' (182.9 cm) Weight: (!) 182 kg (401 lb 3.8 oz) IBW/kg (Calculated) : 77.6  Temp (24hrs), Avg:98.3 F (36.8 C), Min:97.9 F (36.6 C), Max:98.6 F (37 C)  Recent Labs  Lab 06/09/21 2046  WBC 10.8*  CREATININE 1.15  LATICACIDVEN 1.9    Estimated Creatinine Clearance: 103.8 mL/min (by C-G formula based on SCr of 1.15 mg/dL).    Allergies  Allergen Reactions   Morphine Anxiety and Other (See Comments)    Hallucinations  Pt states 6.17.22 that he is not allergic to this medication   Morphine And Related Anxiety    Antimicrobials this admission: 7/02 Vancomycin >>  7/02 Zosyn >>  7/02 Clindamycin >> x 10 doses  Microbiology results: No lab cultures ordered or pending at this time.  Thank you for allowing pharmacy to be a part of this patient's care.  Otelia Sergeant, PharmD, [facility] 06/10/2021 7:28 AM

## 2021-06-10 NOTE — ED Notes (Signed)
Pt given drink per his request

## 2021-06-10 NOTE — Consult Note (Signed)
I have been asked to see the patient by Dr. [name], for evaluation and management of infected scrotal wound.  History of present illness: 89M very unhealthy with recent history of fournier gangrene require several debridement operations to control who was doing well at home with home health doing his dressing changes that presented to the hospital with acute pain and bleeding from his wound.   Patient discharged home on 6/18 from the above and was having dressing changes with home health twice weekly and family members were also assisting on other days.  He was given an extra week of augmentin from ID based on pictures from the home health nurse on 7/1.  On that day he started to have sharp stabbing pains in his left testicle and noted bleeding - he was brought to ED for further evaluation.  It was noted to have purulence from within the wound.  Has indwelling catheter due to urethral stricture dilation.  WBC normal, BMP normal.  No hemodynamic instability.  Pain intermittent.  On Eliquis for recent history of saddle PE.  Review of systems: A 12 point comprehensive review of systems was obtained and is negative unless otherwise stated in the history of present illness.  Patient Active Problem List   Diagnosis Date Noted   Scrotal bleeding 06/10/2021   Abscess 05/18/2021   Fournier's gangrene of scrotum 05/18/2021   Fournier gangrene 05/18/2021   Sepsis (HCC) 05/18/2021   Functional quadriplegia (HCC) 05/18/2021   Bacteremia due to Gram-negative bacteria 05/07/2021   Constipated 05/06/2021   Physical deconditioning 05/06/2021   Atherosclerosis of aorta (HCC) 09/28/2020   Acute on chronic heart failure (HCC) 08/02/2020   Acute saddle pulmonary embolism (HCC) 08/02/2020   Hypoxia 08/01/2020   Weakness 08/01/2020   Abnormality of gait and mobility 08/01/2020   AKI (acute kidney injury) (HCC) 07/21/2020   Hypotension 07/20/2020   Leukocytosis 07/20/2020   Cellulitis of right lower  extremity 07/20/2020   Pressure injury of skin 07/12/2020   Acute respiratory failure with hypoxia (HCC) 07/10/2020   COPD with acute exacerbation (HCC)    Elevated troponin    Chronic diastolic CHF (congestive heart failure) (HCC) 05/24/2020   Anasarca    Acute renal failure superimposed on stage 3a chronic kidney disease (HCC)    Hyperlipidemia    Tobacco abuse    Shortness of breath 04/28/2020   Upper respiratory tract infection due to COVID-19 virus 04/28/2020   Edema 04/28/2020   Encounter for general adult medical examination with abnormal findings 01/15/2019   Acute non-recurrent maxillary sinusitis 01/15/2019   Gastroesophageal reflux disease without esophagitis 01/15/2019   CAD in native artery 12/16/2018   Morbid obesity with BMI of 50.0-59.9, adult (HCC) 12/16/2018   Cigarette smoker motivated to quit 12/16/2018   Chronic pain of right knee 08/04/2018   Mild intermittent asthma 08/04/2018   Impaired fasting glucose 04/03/2018   Low back pain with right-sided sciatica 03/05/2018   Chronic right-sided low back pain with right-sided sciatica 03/05/2018   Pain in right hip 03/05/2018   Hypertension 03/05/2018   Foreign body in bladder and urethra 06/09/2013   Kidney stone 06/09/2013   Ureteric stone 06/09/2013   Kidney stone 06/09/2013    No current facility-administered medications on file prior to encounter.   Current Outpatient Medications on File Prior to Encounter  Medication Sig Dispense Refill   amoxicillin-clavulanate (AUGMENTIN) 875-125 MG tablet Take 1 tablet by mouth 2 (two) times daily. 14 tablet 0   apixaban (ELIQUIS) 5 MG  TABS tablet Take 1 tablet (5 mg total) by mouth 2 (two) times daily. 180 tablet 1   famotidine (PEPCID) 20 MG tablet Take 1 tablet (20 mg total) by mouth 2 (two) times daily. 180 tablet 1   iron polysaccharides (NIFEREX) 150 MG capsule Take 1 capsule (150 mg total) by mouth daily. 30 capsule 0   metoprolol succinate (TOPROL-XL) 50 MG 24  hr tablet Take 1 tablet by mouth once daily 30 tablet 0   phenylephrine-shark liver oil-mineral oil-petrolatum (PREPARATION H) 0.25-14-74.9 % rectal ointment Place 1 application rectally 2 (two) times daily as needed for hemorrhoids.     rosuvastatin (CRESTOR) 20 MG tablet Take 1 tablet (20 mg total) by mouth daily. 90 tablet 1   senna-docusate (SENOKOT-S) 8.6-50 MG tablet Take 1 tablet by mouth 2 (two) times daily.     VENTOLIN HFA 108 (90 Base) MCG/ACT inhaler Inhale 2 puffs into the lungs every 6 (six) hours as needed for wheezing or shortness of breath. 48 g 1   vitamin B-12 1000 MCG tablet Take 1 tablet (1,000 mcg total) by mouth daily. 30 tablet 0   polyethylene glycol (MIRALAX / GLYCOLAX) 17 g packet Take 17 g by mouth daily. (Patient not taking: Reported on 06/10/2021) 14 each 0    Past Medical History:  Diagnosis Date   Arthritis    CHF (congestive heart failure) (HCC)    COPD (chronic obstructive pulmonary disease) (HCC)    Diabetes (HCC)    Hyperlipidemia    Hypertension    Kidney stone    Obesity    Tachycardia    TIA (transient ischemic attack)     Past Surgical History:  Procedure Laterality Date   CORONARY/GRAFT ACUTE MI REVASCULARIZATION N/A 12/08/2018   Procedure: Coronary/Graft Acute MI Revascularization;  Surgeon: Alwyn Pea, MD;  Location: [facility];  Service: Cardiovascular;  Laterality: N/A;   INCISION AND DRAINAGE ABSCESS N/A 05/18/2021   Procedure: INCISION AND DRAINAGE SCROTAL ABSCESS, cystoscopy with urethral dilation, and complex catheter placement;  Surgeon: Sondra Come, MD;  Location: [facility] ORS;  Service: Urology;  Laterality: N/A;   IRRIGATION AND DEBRIDEMENT ABSCESS N/A 05/20/2021   Procedure: IRRIGATION AND DEBRIDEMENT ABSCESS;  Surgeon: Riki Altes, MD;  Location: [facility] OR[facility];  Service: Urology;  Laterality: N/A;   IRRIGATION AND DEBRIDEMENT ABSCESS N/A 05/21/2021   Procedure: IRRIGATION AND DEBRIDEMENT ABSCESS;  Surgeon: Riki Altes, MD;  Location: [facility] OR[facility];  Service: Urology;  Laterality: N/A;   KIDNEY STONE SURGERY     left arm surgery  1963   LEFT HEART CATH AND CORONARY ANGIOGRAPHY N/A 12/08/2018   Procedure: LEFT HEART CATH AND CORONARY ANGIOGRAPHY;  Surgeon: Alwyn Pea, MD;  Location: [facility];  Service: Cardiovascular;  Laterality: N/A;   PULMONARY THROMBECTOMY N/A 08/03/2020   Procedure: PULMONARY THROMBECTOMY / THROMBOLYSIS;  Surgeon: Annice Needy, MD;  Location: [facility];  Service: Cardiovascular;  Laterality: N/A;   RECTAL EXAM UNDER ANESTHESIA N/A 05/20/2021   Procedure: EXAM UNDER ANESTHESIA-Dressing and Change;  Surgeon: Riki Altes, MD;  Location: [facility] OR[facility];  Service: Urology;  Laterality: N/A;    Social History   Tobacco Use   Smoking status: Every Day    Packs/day: 0.50    Pack years: 0.00    Types: Cigarettes    Last attempt to quit: 07/13/2020    Years since quitting: 0.9   Smokeless tobacco: Never  Substance Use Topics   Alcohol use: No  Drug use: No    Family History  Problem Relation Age of Onset   Alzheimer's disease Mother    CAD Father     PE: Vitals:   06/10/21 0500 06/10/21 0600 06/10/21 0806 06/10/21 1000  BP: (!) 119/52 140/80 (!) 100/54 103/65  Pulse: 86 87 82 81  Resp:  18 20 20   Temp:      TempSrc:      SpO2: 98% 97% 96% 99%  Weight:      Height:       Patient appears to be in no acute distress  patient is alert and oriented x3 Morbidly obese on 3L  Atraumatic normocephalic head No cervical or supraclavicular lymphadenopathy appreciated No increased work of breathing, no audible wheezes/rhonchi Regular sinus rhythm/rate Left hemi scrotum open with large amt of anterior skin debrided.  Testicle and wound bed around testicle and cord extended down to the perineum is healthy appearing with good pink granulation tissue.  There is a minimum amount of exudative slough, but for the most part the wound looks really  clean. Abdomen is soft, nontender, nondistended, no CVA or suprapubic tenderness Lower extremities are symmetric without appreciable edema with chronic venous stasis changes Grossly neurologically intact No identifiable skin lesions  Recent Labs    06/09/21 2046 06/10/21 0726  WBC 10.8* 10.6*  HGB 10.7* 10.2*  HCT 28.5* 32.4*   Recent Labs    06/09/21 2046 06/10/21 0726  NA 134* 134*  K 3.6 3.8  CL 97* 97*  CO2 27 27  GLUCOSE 157* 97  BUN 10 9  CREATININE 1.15 1.09  CALCIUM 8.5* 8.4*   No results for input(s): LABPT, INR in the last 72 hours. No results for input(s): LABURIN in the last 72 hours. Results for orders placed or performed during the hospital encounter of 06/10/21  Resp Panel by RT-PCR (Flu A&B, Covid) Nasopharyngeal Swab     Status: None   Collection Time: 06/10/21  2:04 AM   Specimen: Nasopharyngeal Swab; Nasopharyngeal(NP) swabs in vial transport medium  Result Value Ref Range Status   SARS Coronavirus 2 by RT PCR NEGATIVE NEGATIVE Final    Comment: (NOTE) SARS-CoV-2 target nucleic acids are NOT DETECTED.  The SARS-CoV-2 RNA is generally detectable in upper respiratory specimens during the acute phase of infection. The lowest concentration of SARS-CoV-2 viral copies this assay can detect is 138 copies/mL. A negative result does not preclude SARS-Cov-2 infection and should not be used as the sole basis for treatment or other patient management decisions. A negative result may occur with  improper specimen collection/handling, submission of specimen other than nasopharyngeal swab, presence of viral mutation(s) within the areas targeted by this assay, and inadequate number of viral copies(<138 copies/mL). A negative result must be combined with clinical observations, patient history, and epidemiological information. The expected result is Negative.  Fact Sheet for Patients:  08/11/21  Fact Sheet for Healthcare  Providers:  BloggerCourse.com  This test is no t yet approved or cleared by the [place] FDA and  has been authorized for detection and/or diagnosis of SARS-CoV-2 by FDA under an Emergency Use Authorization (EUA). This EUA will remain  in effect (meaning this test can be used) for the duration of the COVID-19 declaration under Section 564(b)(1) of the Act, 21 U.S.C.section 360bbb-3(b)(1), unless the authorization is terminated  or revoked sooner.       Influenza A by PCR NEGATIVE NEGATIVE Final   Influenza B by PCR NEGATIVE NEGATIVE Final  Comment: (NOTE) The Xpert Xpress SARS-CoV-2/FLU/RSV plus assay is intended as an aid in the diagnosis of influenza from Nasopharyngeal swab specimens and should not be used as a sole basis for treatment. Nasal washings and aspirates are unacceptable for Xpert Xpress SARS-CoV-2/FLU/RSV testing.  Fact Sheet for Patients: BloggerCourse.comhttps://www.fda.gov/media/152166/download  Fact Sheet for Healthcare Providers: SeriousBroker.ithttps://www.fda.gov/media/152162/download  This test is not yet approved or cleared by the [place] FDA and has been authorized for detection and/or diagnosis of SARS-CoV-2 by FDA under an Emergency Use Authorization (EUA). This EUA will remain in effect (meaning this test can be used) for the duration of the COVID-19 declaration under Section 564(b)(1) of the Act, 21 U.S.C. section 360bbb-3(b)(1), unless the authorization is terminated or revoked.  Performed at [facility], [address] [place], [place] [id number]     Imaging: none  Imp/ Recommendations:  History of fournier's gangrene with some difficulty with wound care, but despite this the wound actually looks really good.  There is no evidence of ascending or deeper infection. I suspect he may have started to develop a superficial infection within the wound bed from poor wound care and his other poorly controlled co-morbidities.  This, plus  being anticoagulated caused some bleeding.  I redressed his scrotal wound with 4 moist 4x4 gauzes and then placed additional gauze around and ontop of the scrotal wound.  He's to big to wear the mesh underpants provided here at [facility].  Maybe nursing or wound care can find a good solution for keeping the dressings in place.  The dressings should be changed in a similar fashion twice daily while in the hospital and once daily at home.  I wouldn't think that he actually needs IV antibiotics based on the cleanliness of the wound bed.  If he's able to get regular dressing changes he would likely be fine with continuing the augmentin or even just keflex for 5 days.   If home health can be rearranged he can likely be discharged.  He will still need to see plastics and have close f/u with [facility].  The foley should be left for now and changed every four weeks by urology in their clinic.  Will sign off.   Crist FatBenjamin W Cornelius Marullo

## 2021-06-10 NOTE — ED Notes (Addendum)
Urology at bedside completing wound care/wet-to-dry dressing.

## 2021-06-11 ENCOUNTER — Other Ambulatory Visit: Payer: Self-pay

## 2021-06-11 ENCOUNTER — Encounter: Payer: Self-pay | Admitting: Family Medicine

## 2021-06-11 DIAGNOSIS — N501 Vascular disorders of male genital organs: Secondary | ICD-10-CM | POA: Diagnosis not present

## 2021-06-11 LAB — CBC
HCT: 27.7 % — ABNORMAL LOW (ref 39.0–52.0)
Hemoglobin: 10 g/dL — ABNORMAL LOW (ref 13.0–17.0)
MCH: 34.6 pg — ABNORMAL HIGH (ref 26.0–34.0)
MCHC: 36.1 g/dL — ABNORMAL HIGH (ref 30.0–36.0)
MCV: 95.8 fL (ref 80.0–100.0)
Platelets: 357 10*3/uL (ref 150–400)
RBC: 2.89 MIL/uL — ABNORMAL LOW (ref 4.22–5.81)
RDW: 15.6 % — ABNORMAL HIGH (ref 11.5–15.5)
WBC: 9.2 10*3/uL (ref 4.0–10.5)
nRBC: 0 % (ref 0.0–0.2)

## 2021-06-11 LAB — CREATININE, SERUM
Creatinine, Ser: 1.01 mg/dL (ref 0.61–1.24)
GFR, Estimated: >60 mL/min (ref >60)

## 2021-06-11 MED ORDER — CHLORHEXIDINE GLUCONATE CLOTH 2 % EX PADS
6.0000 | MEDICATED_PAD | Freq: Every day | CUTANEOUS | Status: DC
  Administered 2021-06-11 – 2021-06-12 (×2): 6 via TOPICAL

## 2021-06-11 MED ORDER — OXYCODONE-ACETAMINOPHEN 5-325 MG PO TABS
1.0000 | ORAL_TABLET | ORAL | Status: DC | PRN
  Administered 2021-06-11 (×2): 2 via ORAL
  Filled 2021-06-11 (×2): qty 2

## 2021-06-11 MED ORDER — TRAMADOL HCL 50 MG PO TABS
50.0000 mg | ORAL_TABLET | Freq: Four times a day (QID) | ORAL | Status: DC | PRN
Start: 2021-06-11 — End: 2021-06-12
  Administered 2021-06-11: 50 mg via ORAL
  Filled 2021-06-11: qty 1

## 2021-06-11 NOTE — TOC Initial Note (Addendum)
Transition of Care [facility]) - Initial/Assessment Note    Patient Details  Name: Thomas Tanner MRN: [id number] Date of Birth: [birth year]  Transition of Care [facility]) CM/SW Contact:    Verna Czech [place], [place] Phone Number:[phone] 06/11/2021, 10:37 AM  Clinical Narrative:                 Patient is a 70 year old male who presented to the emergency room with bleeding from the left testicle. No family at bedside, however patient confirms strong support system. Patient resides with his spousem son and grandson. Patient recently discharged on 05/17/21 with a Bariatric Hospital bed and [facility] services through [facility]. Patient confirmed that he did receive the Bariatric bed and is active with [facility]. Phone call to [place] at [place] who confirmed that services can resume post discharge. PT, OT, Nursing-wound care.  TOC to continue to follow  [employer], LCSW Transition of Care [phone]   Expected Discharge Plan: Home w Home Health Services Barriers to Discharge: Continued Medical Work up   Patient Goals and CMS Choice Patient states their goals for this hospitalization and ongoing recovery are:: "I want to be able to live my life again"      Expected Discharge Plan and Services Expected Discharge Plan: Home w Home Health Services In-house Referral: Clinical Social Work   Post Acute Care Choice: Durable Medical Equipment (bariatric bed, wheelchair, walker) Living arrangements for the past 2 months: Single Family Home                           HH Arranged: PT, OT, RN [facility] Agency:  ([facility] to resume services post discharge) Date HH Agency Contacted: 06/11/21 (previously arranged and can resume) Time HH Agency Contacted: 1036 Representative spoke with at [facility] Agency: [place]  Prior Living Arrangements/Services Living arrangements for the past 2 months: Single Family Home Lives with:: Spouse, Adult Children   Do you feel safe going back to the place  where you live?: Yes      Need for Family Participation in Patient Care: Yes (Comment) Care giver support system in place?: Yes (comment) Current home services: Home RN ([facility] provides wound care) Criminal Activity/Legal Involvement Pertinent to Current Situation/Hospitalization: No - Comment as needed  Activities of Daily Living      Permission Sought/Granted            Permission granted to share info w Relationship: Presenter, broadcasting  Permission granted to share info w Contact Information: [phone]  Emotional Assessment Appearance:: Appears stated age Attitude/Demeanor/Rapport: Engaged Affect (typically observed): Accepting, Adaptable   Alcohol / Substance Use: Not Applicable Psych Involvement: No (comment)  Admission diagnosis:  Scrotal bleeding [N50.1] Patient Active Problem List   Diagnosis Date Noted   Scrotal bleeding 06/10/2021   Abscess 05/18/2021   Fournier's gangrene of scrotum 05/18/2021   Fournier gangrene 05/18/2021   Sepsis (HCC) 05/18/2021   Functional quadriplegia (HCC) 05/18/2021   Bacteremia due to Gram-negative bacteria 05/07/2021   Constipated 05/06/2021   Physical deconditioning 05/06/2021   Atherosclerosis of aorta (HCC) 09/28/2020   Acute on chronic heart failure (HCC) 08/02/2020   Acute saddle pulmonary embolism (HCC) 08/02/2020   Hypoxia 08/01/2020   Weakness 08/01/2020   Abnormality of gait and mobility 08/01/2020   AKI (acute kidney injury) (HCC) 07/21/2020   Hypotension 07/20/2020   Leukocytosis 07/20/2020   Cellulitis of right lower extremity 07/20/2020   Pressure injury of skin 07/12/2020   Acute respiratory failure  with hypoxia (HCC) 07/10/2020   COPD with acute exacerbation (HCC)    Elevated troponin    Chronic diastolic CHF (congestive heart failure) (HCC) 05/24/2020   Anasarca    Acute renal failure superimposed on stage 3a chronic kidney disease (HCC)    Hyperlipidemia    Tobacco abuse    Shortness of breath  04/28/2020   Upper respiratory tract infection due to COVID-19 virus 04/28/2020   Edema 04/28/2020   Encounter for general adult medical examination with abnormal findings 01/15/2019   Acute non-recurrent maxillary sinusitis 01/15/2019   Gastroesophageal reflux disease without esophagitis 01/15/2019   CAD in native artery 12/16/2018   Morbid obesity with BMI of 50.0-59.9, adult (HCC) 12/16/2018   Cigarette smoker motivated to quit 12/16/2018   Chronic pain of right knee 08/04/2018   Mild intermittent asthma 08/04/2018   Impaired fasting glucose 04/03/2018   Low back pain with right-sided sciatica 03/05/2018   Chronic right-sided low back pain with right-sided sciatica 03/05/2018   Pain in right hip 03/05/2018   Hypertension 03/05/2018   Foreign body in bladder and urethra 06/09/2013   Kidney stone 06/09/2013   Ureteric stone 06/09/2013   Kidney stone 06/09/2013   PCP:  Lyndon Code, MD Pharmacy:   [facility] [address]), [place] - [address] [place] ([place]) [place] [address] Phone: [phone] Fax: [phone]  [facility]  [facility]) - [place] - [address] [place] [address] Phone: [phone] Fax: [phone]     Social Determinants of Health (SDOH) Interventions    Readmission Risk Interventions No flowsheet data found.

## 2021-06-11 NOTE — Consult Note (Signed)
WOC Nurse Consult Note: Reason for Consult:dressing changes Wound type: Surgical Pressure Injury POA: N/A  Dr. Marlou Porch (Urology) saw the patient yesterday and did not suspect deeper infection, but rather infrequent and inadequate wound care.  Dr.Herrick's recommendations for care per his note are written below (excerpted):  "I redressed his scrotal wound with 4 moist 4x4 gauzes and then placed additional gauze around and ontop of the scrotal wound.  He's to big to wear the mesh underpants provided here at [facility]".  I have transcribed that care to Nursing Orders in the chart.  BID wound care while in house, decreasing to once daily at home.  Patient is to follow with Plastics for wound care and in the urology clinic every 4 weeks for catheter changes.  No other recommendations for wound care other than as written above by Urology.  WOC nursing team will not follow, but will remain available to this patient, the nursing and medical teams.  Please re-consult if needed. Thanks, Ladona Mow, MSN, RN, GNP, [place]  Pager# [phone]

## 2021-06-11 NOTE — ED Notes (Signed)
Pt given breakfast tray at this time. 

## 2021-06-11 NOTE — Progress Notes (Addendum)
PROGRESS NOTE    Thomas Tanner   GXQ:[id number]  DOB: 06-Sep-1951  PCP: Lyndon Code, MD    DOA: 06/10/2021 LOS: 1   Assessment & Plan   Principal Problem:   Scrotal bleeding Active Problems:   Fournier's gangrene of scrotum   Hypertension   Chronic diastolic CHF (congestive heart failure) (HCC)   Hyperlipidemia   Hypotension   Physical deconditioning   Functional quadriplegia (HCC)   Recent Fournier's gangrene -recent course as above and urology consult note.  Apparently difficulty with wound care at home.  Patient declined SNF/discharge.  Urology does not feel there is persistent or worsening infection at this time, suspect superficial infection in the wound bed and bleeding secondary to anticoagulation. --Urology consulted, changed to dressings at bedside 7/2 -see their note for wound care suggestions --TOC consulted to arrange home health --Dressing changes twice daily while admitted, once daily at home --d/c broad spectrum IV antibiotics & monitor closely --resume Augmentin (ID had prescribed 1 wk course on 7/1 based on HH's concerns) --Follow-up wound culture obtained 7/2 --Pain control per orders --Adequate home wound care is absolutely essential -- Maintain Foley catheter, to be changed q 4 weeks  Abnormal Urinanalysis - pt does not complain of urinary symptoms; has indwelling Foley; afebrile.  In setting of recent history, suspect contamination.  Getting antibiotic 5 more days as above.  Defer urine culture as very low clinical suspicion for UTI at this time.  Monitor clinically.  Hx of Saddle Pulmonary Embolism - continue Eliquis.  Hypotension - BP 92/50 on monitor in ED during encounter today and patient feeling unwell.  Given recent severe infection, warrants monitoring closely.  Hx of Essential HTN, Hx of CAD - continue Toprol-XL, statin  Hyperlipidemia - statin  Morbid Obesity: Body mass index is 54.42 kg/m.  Complicates overall care and  prognosis.  Recommend lifestyle modifications including physical activity and diet for weight loss and overall long-term health.   DVT prophylaxis:  apixaban (ELIQUIS) tablet 5 mg   Diet:  Diet Orders (From admission, onward)     Start     Ordered   06/11/21 0734  Diet heart healthy/carb modified Room service appropriate? Yes; Fluid consistency: Thin  Diet effective now       Question Answer Comment  Diet-HS Snack? Nothing   Room service appropriate? Yes   Fluid consistency: Thin      06/11/21 0733              Code Status: Full Code   Brief Narrative / Hospital Course to Date:   70 y.o. obese Caucasian male with medical history significant for CHF, COPD, type 2 diabetes mellitus, hypertension, dyslipidemia, TIA and osteoarthritis, who is status post debridement for Fournier's gangrene with exposed left testicle and presented to the emergency room with onset of bleeding from the left  testicle with purulent discharge.  Admitted to [facility] service with urology consulted.  Started on broad spectrum IV antibiotics (Vanc, Zosyn, Clinda).   Subjective 06/11/21    Pt seen in the ED still holding for a bed. Reports pain as moderate after getting PO medicine recently.  Feels unwell in general but non-specific in complaints.    Disposition Plan & Communication   Status is: Inpatient  Remains inpatient appropriate because: requiring IV medication for pain control; cultures pending; hypotensive and symptomatic on rounds in ED  Dispo: The patient is from: Home              Anticipated d/c  is to: Home              Patient currently is not medically stable to d/c.   Difficult to place patient No   Consults, Procedures, Significant Events   Consultants:  Urology  Procedures:  None  Antimicrobials:  Anti-infectives (From admission, onward)    Start     Dose/Rate Route Frequency Ordered Stop   06/10/21 2200  amoxicillin-clavulanate (AUGMENTIN) 875-125 MG per tablet 1 tablet         1 tablet Oral Every 12 hours 06/10/21 1734 06/13/21 0959   06/10/21 1800  vancomycin (VANCOREADY) IVPB 1250 mg/250 mL  Status:  Discontinued        1,250 mg 166.7 mL/hr over 90 Minutes Intravenous Every 12 hours 06/10/21 0519 06/10/21 1734   06/10/21 1100  clindamycin (CLEOCIN) IVPB 600 mg  Status:  Discontinued        600 mg 100 mL/hr over 30 Minutes Intravenous Every 8 hours 06/10/21 0523 06/10/21 1734   06/10/21 0700  piperacillin-tazobactam (ZOSYN) IVPB 3.375 g  Status:  Discontinued        3.375 g 12.5 mL/hr over 240 Minutes Intravenous Every 8 hours 06/10/21 0439 06/10/21 1734   06/10/21 0600  clindamycin (CLEOCIN) IVPB 600 mg  Status:  Discontinued        600 mg 100 mL/hr over 30 Minutes Intravenous Every 8 hours 06/10/21 0317 06/10/21 0523   06/10/21 0530  vancomycin (VANCOREADY) IVPB 1500 mg/300 mL        1,500 mg 150 mL/hr over 120 Minutes Intravenous  Once 06/10/21 0317 06/10/21 0809   06/10/21 0330  piperacillin-tazobactam (ZOSYN) IVPB 3.375 g  Status:  Discontinued        3.375 g 100 mL/hr over 30 Minutes Intravenous  Once 06/10/21 0317 06/10/21 0437   06/10/21 0200  piperacillin-tazobactam (ZOSYN) IVPB 3.375 g        3.375 g 100 mL/hr over 30 Minutes Intravenous  Once 06/10/21 0154 06/10/21 0304   06/10/21 0200  vancomycin (VANCOCIN) IVPB 1000 mg/200 mL premix        1,000 mg 200 mL/hr over 60 Minutes Intravenous  Once 06/10/21 0154 06/10/21 0455   06/10/21 0200  clindamycin (CLEOCIN) IVPB 600 mg        600 mg 100 mL/hr over 30 Minutes Intravenous  Once 06/10/21 0154 06/10/21 0410         Micro    Objective   Vitals:   06/11/21 1205 06/11/21 1331 06/11/21 1427 06/11/21 1835  BP: (!) 105/53 110/68 (!) 116/55 114/63  Pulse: 82 80 78 81  Resp: 17 17 16 18   Temp:   98.5 F (36.9 C) 98 F (36.7 C)  TempSrc:   Oral   SpO2: 93% 97% 94% 100%  Weight:      Height:        Intake/Output Summary (Last 24 hours) at 06/11/2021 1859 Last data filed at 06/11/2021  1846 Gross per 24 hour  Intake 2589.45 ml  Output 900 ml  Net 1689.45 ml   Filed Weights   06/09/21 2037  Weight: (!) 182 kg    Physical Exam:  General exam: awake, alert, no acute distress but appears uncomfortable, morbidly obese, chronically ill-appearing, disheveled Respiratory system: normal respiratory effort, clear but diminished due to body habitus. Cardiovascular system: RRR, no pedal edema.   Gastrointestinal system: Protuberant abdomen nontender Central nervous system: A&O x 3. normal speech Extremities: onychomycosis, overgrown toenails and fingernails appears dirty underneath fingernails, ble venous stasis  Labs   Data Reviewed: I have personally reviewed following labs and imaging studies  CBC: Recent Labs  Lab 06/09/21 2046 06/10/21 0726 06/11/21 0636  WBC 10.8* 10.6* 9.2  NEUTROABS 8.9*  --   --   HGB 10.7* 10.2* 10.0*  HCT 28.5* 32.4* 27.7*  MCV 93.1 91.0 95.8  PLT 406* 368 357   Basic Metabolic Panel: Recent Labs  Lab 06/09/21 2046 06/10/21 0726 06/11/21 0636  NA 134* 134*  --   K 3.6 3.8  --   CL 97* 97*  --   CO2 27 27  --   GLUCOSE 157* 97  --   BUN 10 9  --   CREATININE 1.15 1.09 1.01  CALCIUM 8.5* 8.4*  --    GFR: Estimated Creatinine Clearance: 118.2 mL/min (by C-G formula based on SCr of 1.01 mg/dL). Liver Function Tests: Recent Labs  Lab 06/09/21 2046  AST 30  ALT 20  ALKPHOS 92  BILITOT 0.5  PROT 6.5  ALBUMIN 2.6*   No results for input(s): LIPASE, AMYLASE in the last 168 hours. No results for input(s): AMMONIA in the last 168 hours. Coagulation Profile: No results for input(s): INR, PROTIME in the last 168 hours. Cardiac Enzymes: No results for input(s): CKTOTAL, CKMB, CKMBINDEX, TROPONINI in the last 168 hours. BNP (last 3 results) No results for input(s): PROBNP in the last 8760 hours. HbA1C: No results for input(s): HGBA1C in the last 72 hours. CBG: No results for input(s): GLUCAP in the last 168 hours. Lipid  Profile: No results for input(s): CHOL, HDL, LDLCALC, TRIG, CHOLHDL, LDLDIRECT in the last 72 hours. Thyroid Function Tests: No results for input(s): TSH, T4TOTAL, FREET4, T3FREE, THYROIDAB in the last 72 hours. Anemia Panel: No results for input(s): VITAMINB12, FOLATE, FERRITIN, TIBC, IRON, RETICCTPCT in the last 72 hours. Sepsis Labs: Recent Labs  Lab 06/09/21 2046  LATICACIDVEN 1.9    Recent Results (from the past 240 hour(s))  Resp Panel by RT-PCR (Flu A&B, Covid) Nasopharyngeal Swab     Status: None   Collection Time: 06/10/21  2:04 AM   Specimen: Nasopharyngeal Swab; Nasopharyngeal(NP) swabs in vial transport medium  Result Value Ref Range Status   SARS Coronavirus 2 by RT PCR NEGATIVE NEGATIVE Final    Comment: (NOTE) SARS-CoV-2 target nucleic acids are NOT DETECTED.  The SARS-CoV-2 RNA is generally detectable in upper respiratory specimens during the acute phase of infection. The lowest concentration of SARS-CoV-2 viral copies this assay can detect is 138 copies/mL. A negative result does not preclude SARS-Cov-2 infection and should not be used as the sole basis for treatment or other patient management decisions. A negative result may occur with  improper specimen collection/handling, submission of specimen other than nasopharyngeal swab, presence of viral mutation(s) within the areas targeted by this assay, and inadequate number of viral copies(<138 copies/mL). A negative result must be combined with clinical observations, patient history, and epidemiological information. The expected result is Negative.  Fact Sheet for Patients:  BloggerCourse.comhttps://www.fda.gov/media/152166/download  Fact Sheet for Healthcare Providers:  SeriousBroker.ithttps://www.fda.gov/media/152162/download  This test is no t yet approved or cleared by the [place] FDA and  has been authorized for detection and/or diagnosis of SARS-CoV-2 by FDA under an Emergency Use Authorization (EUA). This EUA will remain  in  effect (meaning this test can be used) for the duration of the COVID-19 declaration under Section 564(b)(1) of the Act, 21 U.S.C.section 360bbb-3(b)(1), unless the authorization is terminated  or revoked sooner.  Influenza A by PCR NEGATIVE NEGATIVE Final   Influenza B by PCR NEGATIVE NEGATIVE Final    Comment: (NOTE) The Xpert Xpress SARS-CoV-2/FLU/RSV plus assay is intended as an aid in the diagnosis of influenza from Nasopharyngeal swab specimens and should not be used as a sole basis for treatment. Nasal washings and aspirates are unacceptable for Xpert Xpress SARS-CoV-2/FLU/RSV testing.  Fact Sheet for Patients: BloggerCourse.com  Fact Sheet for Healthcare Providers: SeriousBroker.it  This test is not yet approved or cleared by the [place] FDA and has been authorized for detection and/or diagnosis of SARS-CoV-2 by FDA under an Emergency Use Authorization (EUA). This EUA will remain in effect (meaning this test can be used) for the duration of the COVID-19 declaration under Section 564(b)(1) of the Act, 21 U.S.C. section 360bbb-3(b)(1), unless the authorization is terminated or revoked.  Performed at [facility], [address] [place], [place] [address]   Body fluid culture w Gram Stain     Status: None (Preliminary result)   Collection Time: 06/10/21  2:04 AM   Specimen: Body Fluid  Result Value Ref Range Status   Specimen Description   Final    FLUID SCROTUM Performed at [facility], [address], [place], [place] [address]    Special Requests   Final    NONE Performed at [facility], [address] [place], [place] [address]    Gram Stain   Final    ABUNDANT WBC PRESENT, PREDOMINANTLY PMN RARE GRAM POSITIVE COCCI IN PAIRS RARE GRAM POSITIVE RODS    Culture   Final    CULTURE REINCUBATED FOR BETTER GROWTH Performed at [facility], [address],  [place], [place] [address]    Report Status PENDING  Incomplete      Imaging Studies   No results found.   Medications   Scheduled Meds:  amoxicillin-clavulanate  1 tablet Oral Q12H   apixaban  5 mg Oral BID   Chlorhexidine Gluconate Cloth  6 each Topical Daily   famotidine  20 mg Oral BID   iron polysaccharides  150 mg Oral Daily   metoprolol succinate  50 mg Oral Daily   rosuvastatin  20 mg Oral Daily   senna-docusate  1 tablet Oral BID   cyanocobalamin  1,000 mcg Oral Daily   Continuous Infusions:       LOS: 1 day    Time spent: 30 minutes with > 50% spent at bedside and in coordination of care.    Pennie Banter, DO [facility]  06/11/2021, 6:59 PM      If 7PM-7AM, please contact night-coverage. How to contact the [facility] Attending or Consulting provider 7A - 7P or covering provider during after hours 7P -7A, for this patient?    Check the care team in [facility] and look for a) attending/consulting [facility] provider listed and b) the [facility] team listed Log into [email or web address] and use [place]'s universal password to access. If you do not have the password, please contact the hospital operator. Locate the [facility] provider you are looking for under [facility] and page to a number that you can be directly reached. If you still have difficulty reaching the provider, please page the [facility] (Director on Call) for the Hospitalists listed on amion for assistance.

## 2021-06-12 DIAGNOSIS — N501 Vascular disorders of male genital organs: Secondary | ICD-10-CM | POA: Diagnosis not present

## 2021-06-12 LAB — URINE CULTURE: Special Requests: NORMAL

## 2021-06-12 LAB — HIV ANTIBODY (ROUTINE TESTING W REFLEX): HIV Screen 4th Generation wRfx: NONREACTIVE

## 2021-06-12 MED ORDER — AMOXICILLIN-POT CLAVULANATE 875-125 MG PO TABS: 1.0000 | ORAL_TABLET | Freq: Two times a day (BID) | ORAL | 0 refills | Status: AC

## 2021-06-12 MED ORDER — RISAQUAD PO CAPS
1.0000 | ORAL_CAPSULE | Freq: Three times a day (TID) | ORAL | Status: DC
  Administered 2021-06-12: 1 via ORAL

## 2021-06-12 MED ORDER — OXYCODONE-ACETAMINOPHEN 5-325 MG PO TABS: 1.0000 | ORAL_TABLET | ORAL | 0 refills | Status: AC | PRN

## 2021-06-12 MED ORDER — BACID PO TABS: 2.0000 | ORAL_TABLET | Freq: Three times a day (TID) | ORAL | Status: DC

## 2021-06-12 MED ORDER — RISAQUAD PO CAPS: 1.0000 | ORAL_CAPSULE | Freq: Three times a day (TID) | ORAL | 0 refills | Status: AC

## 2021-06-12 NOTE — Discharge Summary (Signed)
Physician Discharge Summary  Thomas Tanner ZOX:[id number] DOB: 11/17/1951 DOA: 06/10/2021  PCP: Lyndon Code, MD  Admit date: 06/10/2021 Discharge date: 06/12/2021  Admitted From: home Disposition:  home  Recommendations for Outpatient Follow-up:  Follow up with PCP in 1-2 weeks Please obtain BMP/CBC in one week Please follow up with urology as scheduled, call if needed sooner or concerns about scrotal wound  Home Health: Resumed  Equipment/Devices: None, already has   Discharge Condition: Stable  CODE STATUS: Full  Diet recommendation: Heart Healthy / Carb Modified     Discharge Diagnoses: Principal Problem:   Scrotal bleeding Active Problems:   Fournier's gangrene of scrotum   Hypertension   Chronic diastolic CHF (congestive heart failure) (HCC)   Hyperlipidemia   Hypotension   Physical deconditioning   Functional quadriplegia (HCC)    Summary of HPI and Hospital Course:  70 y.o. obese Caucasian male with medical history significant for CHF, COPD, type 2 diabetes mellitus, hypertension, dyslipidemia, TIA and osteoarthritis, who is status post debridement for Fournier's gangrene with exposed left testicle and presented to the emergency room with onset of bleeding from the left  testicle with purulent discharge.  Admitted to [facility] service with urology consulted.  Started on broad spectrum IV antibiotics (Vanc, Zosyn, Clinda).    Day of discharge:  Seen again by urology this AM due to pt complaint of new/worsening pain.  Urology cleared patient for d/c with oral antibiotics and resumption of diligent wound care at home and outpatient follow up as scheduled.    Hospital Course:  Recent Fournier's gangrene -recent course as above and urology consult note.  Apparently difficulty with wound care at home.  Patient declined SNF/discharge.   Urology does not feel there is persistent or worsening infection at this time, suspect superficial infection in the wound bed and  bleeding secondary to anticoagulation. --Urology consulted, changed to dressings at bedside 7/2 -see their note for wound care instructions --TOC consulted for home health --Dressing changes twice daily while admitted, once daily at home --d/c broad spectrum IV antibiotics & monitor closely - remained clinically stable, afebrile --resume Augmentin (ID had prescribed 1 wk course on 7/1 based on HH's concerns) --Follow-up wound culture obtained 7/2 --Pain control per orders --Adequate home wound care is absolutely essential -- Maintain Foley catheter, to be changed q 4 weeks   Abnormal Urinanalysis - pt does not complain of urinary symptoms; has indwelling Foley; afebrile.  In setting of recent history, suspect contamination.  Getting antibiotic 5 more days as above.  Defer urine culture as very low clinical suspicion for UTI at this time.  Monitor clinically.   Hx of Saddle Pulmonary Embolism - continue Eliquis.   Hypotension - BP 92/50 on monitor in ED during encounter today and patient feeling unwell.  Given recent severe infection, warrants monitoring closely.   Hx of Essential HTN, Hx of CAD - continue Toprol-XL, statin   Hyperlipidemia - statin   Morbid Obesity: Body mass index is 54.42 kg/m.  Complicates overall care and prognosis.  Recommend lifestyle modifications including physical activity and diet for weight loss and overall long-term health.       Discharge Instructions   Discharge Instructions     Call MD for:  extreme fatigue   Complete by: As directed    Call MD for:  persistant dizziness or light-headedness   Complete by: As directed    Call MD for:  persistant nausea and vomiting   Complete by: As directed  Call MD for:  severe uncontrolled pain   Complete by: As directed    Call MD for:  temperature >100.4   Complete by: As directed    Diet - low sodium heart healthy   Complete by: As directed    Discharge instructions   Complete by: As directed     Take Augmentin twice daily for another 5 days to clear up any mild superficial infection of your wound.  The most important thing for your wound is diligent, regular wound care at home.    Follow up with urology as scheduled.   Discharge wound care:   Complete by: As directed    As above   Increase activity slowly   Complete by: As directed       Allergies as of 06/12/2021       Reactions   Morphine Anxiety, Other (See Comments)   Hallucinations Pt states 6.17.22 that he is not allergic to this medication   Morphine And Related Anxiety        Medication List     STOP taking these medications    polyethylene glycol 17 g packet Commonly known as: MIRALAX / GLYCOLAX       TAKE these medications    acidophilus Caps capsule Take 1 capsule by mouth 3 (three) times daily for 14 days.   amoxicillin-clavulanate 875-125 MG tablet Commonly known as: Augmentin Take 1 tablet by mouth every 12 (twelve) hours for 5 days. What changed: when to take this   apixaban 5 MG Tabs tablet Commonly known as: ELIQUIS Take 1 tablet (5 mg total) by mouth 2 (two) times daily.   cyanocobalamin 1000 MCG tablet Take 1 tablet (1,000 mcg total) by mouth daily.   famotidine 20 MG tablet Commonly known as: PEPCID Take 1 tablet (20 mg total) by mouth 2 (two) times daily.   iron polysaccharides 150 MG capsule Commonly known as: NIFEREX Take 1 capsule (150 mg total) by mouth daily.   metoprolol succinate 50 MG 24 hr tablet Commonly known as: TOPROL-XL Take 1 tablet by mouth once daily   oxyCODONE-acetaminophen 5-325 MG tablet Commonly known as: PERCOCET/ROXICET Take 1-2 tablets by mouth every 4 (four) hours as needed for up to 3 days for moderate pain or severe pain.   phenylephrine-shark liver oil-mineral oil-petrolatum 0.25-14-74.9 % rectal ointment Commonly known as: PREPARATION H Place 1 application rectally 2 (two) times daily as needed for hemorrhoids.   rosuvastatin 20 MG  tablet Commonly known as: CRESTOR Take 1 tablet (20 mg total) by mouth daily.   senna-docusate 8.6-50 MG tablet Commonly known as: Senokot-S Take 1 tablet by mouth 2 (two) times daily.   Ventolin HFA 108 (90 Base) MCG/ACT inhaler Generic drug: albuterol Inhale 2 puffs into the lungs every 6 (six) hours as needed for wheezing or shortness of breath.               Discharge Care Instructions  (From admission, onward)           Start     Ordered   06/12/21 0000  Discharge wound care:       Comments: As above   06/12/21 1130            Allergies  Allergen Reactions   Morphine Anxiety and Other (See Comments)    Hallucinations  Pt states 6.17.22 that he is not allergic to this medication   Morphine And Related Anxiety     If you experience worsening of your admission symptoms,  develop shortness of breath, life threatening emergency, suicidal or homicidal thoughts you must seek medical attention immediately by calling 911 or calling your MD immediately  if symptoms less severe.    Please note   You were cared for by a hospitalist during your hospital stay. If you have any questions about your discharge medications or the care you received while you were in the hospital after you are discharged, you can call the unit and asked to speak with the hospitalist on call if the hospitalist that took care of you is not available. Once you are discharged, your primary care physician will handle any further medical issues. Please note that NO REFILLS for any discharge medications will be authorized once you are discharged, as it is imperative that you return to your primary care physician (or establish a relationship with a primary care physician if you do not have one) for your aftercare needs so that they can reassess your need for medications and monitor your lab values.   Consultations: Urology    Procedures/Studies: DG Abd 1 View  Result Date: 05/19/2021 CLINICAL  DATA:  Constipation abdominal pain. EXAM: ABDOMEN - 1 VIEW COMPARISON:  CT pelvis 05/18/2021. Plain films of the abdomen 05/18/2021. FINDINGS: The bowel gas pattern is normal. No radio-opaque calculi or other significant radiographic abnormality are seen. Rectal tube is in place. Severe right hip osteoarthritis is seen. IMPRESSION: No acute finding. Electronically Signed   By: Drusilla Kanner M.D.   On: 05/19/2021 11:47   DG Abdomen 1 View  Result Date: 05/18/2021 CLINICAL DATA:  Constipation. EXAM: ABDOMEN - 1 VIEW COMPARISON:  None. FINDINGS: The bowel gas pattern is normal. A large amount of stool is seen within the ascending colon. No radio-opaque calculi or other significant radiographic abnormality are seen. Marked severity chronic and degenerative changes are seen involving the right hip. IMPRESSION: Large stool burden without bowel obstruction. Electronically Signed   By: Aram Candela M.D.   On: 05/18/2021 17:37   CT PELVIS WO CONTRAST  Result Date: 05/18/2021 CLINICAL DATA:  Concern for gas in the scrotum on ultrasound. Scrotal pain for 5 weeks. Draining wound EXAM: CT PELVIS WITHOUT CONTRAST TECHNIQUE: Multidetector CT imaging of the pelvis was performed following the standard protocol without intravenous contrast. COMPARISON:  None. FINDINGS: Urinary Tract:  Distal ureters and bladder normal. Bowel: No evidence of constipation. Normal stool volume. Factors limited stool in the sigmoid colon rectum. Vascular/Lymphatic: Calcification abdominal aorta. Pelvic lymphadenopathy. Reproductive:  Prostate is normal. There is gas with LEFT hemiscrotum. There is a large amount of gas interspersed within a LEFT hydrocele. The testicle is suspended within this large volume of gas and fluid. (Image 190/series 3). Gas extends along the inguinal canal to level the base of the penis (image 150/series 3.) Additionally there is gas within the perineum midline (image 153/3. There is gas extends to the base of the  penis (image 151/3. Other:  No free fluid or abscess in the peritoneal space. Musculoskeletal: No evidence of osteomyelitis. Severe cystic degenerative change of the RIGHT hip joint. IMPRESSION: 1. Large volume of gas interspersed within a large LEFT hydrocele. Findings consistent with a gas-forming bacteria infection of the LEFT hemiscrotum. 2. Gas extends superiorly from the LEFT hemiscrotum through the LEFT inguinal canal to level the base of the penis. 3. Additionally gas extends into the midline perineum to level of the base of the penis. 4. Findings consistent with Fournier's gangrene. Recommend emergent urologic surgical consultation. Findings conveyed toJONATHAN CUTHRIELL on  05/18/2021  at20:17. Electronically Signed   By: Genevive Bi M.D.   On: 05/18/2021 20:17   DG Chest Port 1 View  Result Date: 05/19/2021 CLINICAL DATA:  Status post right central line placement EXAM: PORTABLE CHEST 1 VIEW COMPARISON:  05/05/2021 FINDINGS: Cardiac shadow is enlarged but stable. Right jugular central line is noted in the midportion of the right innominate vein. No pneumothorax is seen. No focal infiltrate is noted. No bony abnormality is seen. IMPRESSION: No pneumothorax following central line placement as described. Electronically Signed   By: Alcide Clever M.D.   On: 05/19/2021 16:07   ECHOCARDIOGRAM COMPLETE  Result Date: 05/19/2021    ECHOCARDIOGRAM REPORT   Patient Name:   ZAFAR DEBROSSE Date of Exam: 05/19/2021 Medical Rec #:  [id number]                Height:       73.0 in Accession #:    [id number]               Weight:       421.0 lb Date of Birth:  May 07, 1951                 BSA:          2.952 m Patient Age:    70 years                 BP:           99/49 mmHg Patient Gender: M                        HR:           75 bpm. Exam Location:  [facility] Procedure: 2D Echo, Cardiac Doppler and Color Doppler Indications:     CHF- acute systolic I50.21  History:         Patient has prior history of  Echocardiogram examinations, most                  recent 08/03/2020. CHF, COPD; Risk Factors:Hypertension.  Sonographer:     Cristela Blue RDCS (AE) Referring Phys:  [id number] Judithe Modest Diagnosing Phys: Arnoldo Hooker MD  Sonographer Comments: No apical window, no subcostal window, Technically difficult study due to poor echo windows and suboptimal parasternal window. IMPRESSIONS  1. Left ventricular ejection fraction, by estimation, is 55 to 60%. The left ventricle has normal function. The left ventricle has no regional wall motion abnormalities. Left ventricular diastolic function could not be evaluated.  2. Right ventricular systolic function was not well visualized. The right ventricular size is not well visualized.  3. The mitral valve was not well visualized. No evidence of mitral valve regurgitation.  4. The aortic valve was not well visualized. Aortic valve regurgitation is not visualized. FINDINGS  Left Ventricle: Left ventricular ejection fraction, by estimation, is 55 to 60%. The left ventricle has normal function. The left ventricle has no regional wall motion abnormalities. The left ventricular internal cavity size was normal in size. There is  no left ventricular hypertrophy. Left ventricular diastolic function could not be evaluated. Right Ventricle: The right ventricular size is not well visualized. Right vetricular wall thickness was not well visualized. Right ventricular systolic function was not well visualized. Left Atrium: Left atrial size was not well visualized. Right Atrium: Right atrial size was not well visualized. Pericardium: There is no evidence of pericardial effusion. Mitral Valve: The mitral valve was not well visualized.  No evidence of mitral valve regurgitation. Tricuspid Valve: The tricuspid valve is not well visualized. Tricuspid valve regurgitation is not demonstrated. Aortic Valve: The aortic valve was not well visualized. Aortic valve regurgitation is not visualized. Pulmonic  Valve: The pulmonic valve was not well visualized. Pulmonic valve regurgitation is not visualized. Aorta: The aortic root and ascending aorta are structurally normal, with no evidence of dilitation. IAS/Shunts: The interatrial septum was not well visualized.  LEFT VENTRICLE PLAX 2D LVIDd:         4.43 cm LVIDs:         3.14 cm LV PW:         1.71 cm LV IVS:        1.13 cm LVOT diam:     2.30 cm LVOT Area:     4.15 cm  LEFT ATRIUM         Index LA diam:    3.30 cm 1.12 cm/m                        PULMONIC VALVE AORTA                 PV Vmax:        0.61 m/s Ao Root diam: 2.50 cm PV Peak grad:   1.5 mmHg                       RVOT Peak grad: 2 mmHg   SHUNTS Systemic Diam: 2.30 cm Arnoldo Hooker MD Electronically signed by Arnoldo Hooker MD Signature Date/Time: 05/19/2021/3:35:43 PM    Final    US SCROTUM W/DOPPLER  Result Date: 05/18/2021 CLINICAL DATA:  Scrotal pain for 5 weeks, wound draining at RIGHT hemiscrotum EXAM: SCROTAL ULTRASOUND DOPPLER ULTRASOUND OF THE TESTICLES TECHNIQUE: Complete ultrasound examination of the testicles, epididymis, and other scrotal structures was performed. Color and spectral Doppler ultrasound were also utilized to evaluate blood flow to the testicles. COMPARISON:  None FINDINGS: Right testicle Measurements: 3.8 x 2.8 x 3.0 cm. Normal echogenicity without mass or calcification. Internal blood flow present on color Doppler imaging. Left testicle Measurements: 4.1 x 2.2 x 2.5 cm. Normal echogenicity without mass or calcification. Internal blood flow present on color Doppler imaging. Right epididymis:  Normal in size and appearance. Left epididymis:  Normal in size and appearance. Hydrocele:  Trace RIGHT hydrocele Varicocele:  None visualized. Pulsed Doppler interrogation of both testes demonstrates normal low resistance arterial and venous waveforms bilaterally. Marked soft tissue swelling in the scrotum bilaterally. Multiple new tiny foci of bright echogenicity new since previous  study which cause scattered shadowing, highly suspicious for soft tissue gas and question Fournier's gangrene. Scattered areas of edema/fluid within tissue planes. IMPRESSION: Normal appearing testes and epididymi. Marked soft tissue swelling and edema in the scrotum with multiple new foci of bright shadowing in the scrotum especially on RIGHT. Findings are highly suspicious for foci of gas and presence of Fournier's gangrene. This could be confirmed by CT imaging. No large discrete drainable fluid collection identified. Critical Value/emergent results were called by telephone at the time of interpretation on 05/18/2021 at 6:51 pm to provider [facility] PA, who verbally acknowledged these results. Electronically Signed   By: Ulyses Southward M.D.   On: 05/18/2021 18:51       Subjective: Pt feels okay, just run down which he has been since last admission, very slow to recover from his illness.  Denies fever/chills.  Seen by urology this  AM, reassured.  We discussed importance of excellent wound care at home.   Discharge Exam: Vitals:   06/12/21 0722 06/12/21 1125  BP: (!) 114/48 (!) 109/47  Pulse: 78 72  Resp: 16 16  Temp: 98.2 F (36.8 C) 98.2 F (36.8 C)  SpO2: 98% 98%   Vitals:   06/11/21 2333 06/12/21 0539 06/12/21 0722 06/12/21 1125  BP: 95/60 113/62 (!) 114/48 (!) 109/47  Pulse: 82 77 78 72  Resp: 20 18 16 16   Temp: 97.9 F (36.6 C) 97.6 F (36.4 C) 98.2 F (36.8 C) 98.2 F (36.8 C)  TempSrc:      SpO2: 99% 99% 98% 98%  Weight:      Height:        General: Pt is alert, awake, not in acute distress, morbidly obese Cardiovascular: RRR, S1/S2 +, no rubs, no gallops Respiratory: CTA bilaterally, no wheezing, no rhonchi Abdominal: Soft, NT, ND, bowel sounds + Extremities: b/l LE venous stasis with stable edema, no cyanosis    The results of significant diagnostics from this hospitalization (including imaging, microbiology, ancillary and laboratory) are listed below for  reference.     Microbiology: Recent Results (from the past 240 hour(s))  Resp Panel by RT-PCR (Flu A&B, Covid) Nasopharyngeal Swab     Status: None   Collection Time: 06/10/21  2:04 AM   Specimen: Nasopharyngeal Swab; Nasopharyngeal(NP) swabs in vial transport medium  Result Value Ref Range Status   SARS Coronavirus 2 by RT PCR NEGATIVE NEGATIVE Final    Comment: (NOTE) SARS-CoV-2 target nucleic acids are NOT DETECTED.  The SARS-CoV-2 RNA is generally detectable in upper respiratory specimens during the acute phase of infection. The lowest concentration of SARS-CoV-2 viral copies this assay can detect is 138 copies/mL. A negative result does not preclude SARS-Cov-2 infection and should not be used as the sole basis for treatment or other patient management decisions. A negative result may occur with  improper specimen collection/handling, submission of specimen other than nasopharyngeal swab, presence of viral mutation(s) within the areas targeted by this assay, and inadequate number of viral copies(<138 copies/mL). A negative result must be combined with clinical observations, patient history, and epidemiological information. The expected result is Negative.  Fact Sheet for Patients:  BloggerCourse.com  Fact Sheet for Healthcare Providers:  SeriousBroker.it  This test is no t yet approved or cleared by the [place] FDA and  has been authorized for detection and/or diagnosis of SARS-CoV-2 by FDA under an Emergency Use Authorization (EUA). This EUA will remain  in effect (meaning this test can be used) for the duration of the COVID-19 declaration under Section 564(b)(1) of the Act, 21 U.S.C.section 360bbb-3(b)(1), unless the authorization is terminated  or revoked sooner.       Influenza A by PCR NEGATIVE NEGATIVE Final   Influenza B by PCR NEGATIVE NEGATIVE Final    Comment: (NOTE) The Xpert Xpress SARS-CoV-2/FLU/RSV  plus assay is intended as an aid in the diagnosis of influenza from Nasopharyngeal swab specimens and should not be used as a sole basis for treatment. Nasal washings and aspirates are unacceptable for Xpert Xpress SARS-CoV-2/FLU/RSV testing.  Fact Sheet for Patients: BloggerCourse.com  Fact Sheet for Healthcare Providers: SeriousBroker.it  This test is not yet approved or cleared by the [place] FDA and has been authorized for detection and/or diagnosis of SARS-CoV-2 by FDA under an Emergency Use Authorization (EUA). This EUA will remain in effect (meaning this test can be used) for the duration of the COVID-19  declaration under Section 564(b)(1) of the Act, 21 U.S.C. section 360bbb-3(b)(1), unless the authorization is terminated or revoked.  Performed at [facility], [address] [place], [place] [address]   Body fluid culture w Gram Stain     Status: None (Preliminary result)   Collection Time: 06/10/21  2:04 AM   Specimen: Body Fluid  Result Value Ref Range Status   Specimen Description   Final    FLUID SCROTUM Performed at [facility], [address], [place], [place] [address]    Special Requests   Final    NONE Performed at [facility], [address] [place], [place] [address]    Gram Stain   Final    ABUNDANT WBC PRESENT, PREDOMINANTLY PMN RARE GRAM POSITIVE COCCI IN PAIRS RARE GRAM POSITIVE RODS    Culture   Final    CULTURE REINCUBATED FOR BETTER GROWTH Performed at [facility], [address] [place], [place] [address]    Report Status PENDING  Incomplete  Urine Culture     Status: Abnormal   Collection Time: 06/10/21  2:04 AM   Specimen: Urine, Random  Result Value Ref Range Status   Specimen Description   Final    URINE, RANDOM Performed at [facility], [address], [place], [place] [address]    Special Requests   Final    Normal Performed  at [facility], [address] [place], [place] [address]    Culture MULTIPLE SPECIES PRESENT, SUGGEST RECOLLECTION (A)  Final   Report Status 06/12/2021 FINAL  Final     Labs: BNP (last 3 results) Recent Labs    08/02/20 1107 08/03/20 2038 05/05/21 0851  BNP 503.4* 218.8* 81.8   Basic Metabolic Panel: Recent Labs  Lab 06/09/21 2046 06/10/21 0726 06/11/21 0636  NA 134* 134*  --   K 3.6 3.8  --   CL 97* 97*  --   CO2 27 27  --   GLUCOSE 157* 97  --   BUN 10 9  --   CREATININE 1.15 1.09 1.01  CALCIUM 8.5* 8.4*  --    Liver Function Tests: Recent Labs  Lab 06/09/21 2046  AST 30  ALT 20  ALKPHOS 92  BILITOT 0.5  PROT 6.5  ALBUMIN 2.6*   No results for input(s): LIPASE, AMYLASE in the last 168 hours. No results for input(s): AMMONIA in the last 168 hours. CBC: Recent Labs  Lab 06/09/21 2046 06/10/21 0726 06/11/21 0636  WBC 10.8* 10.6* 9.2  NEUTROABS 8.9*  --   --   HGB 10.7* 10.2* 10.0*  HCT 28.5* 32.4* 27.7*  MCV 93.1 91.0 95.8  PLT 406* 368 357   Cardiac Enzymes: No results for input(s): CKTOTAL, CKMB, CKMBINDEX, TROPONINI in the last 168 hours. BNP: Invalid input(s): POCBNP CBG: No results for input(s): GLUCAP in the last 168 hours. D-Dimer No results for input(s): DDIMER in the last 72 hours. Hgb A1c No results for input(s): HGBA1C in the last 72 hours. Lipid Profile No results for input(s): CHOL, HDL, LDLCALC, TRIG, CHOLHDL, LDLDIRECT in the last 72 hours. Thyroid function studies No results for input(s): TSH, T4TOTAL, T3FREE, THYROIDAB in the last 72 hours.  Invalid input(s): FREET3 Anemia work up No results for input(s): VITAMINB12, FOLATE, FERRITIN, TIBC, IRON, RETICCTPCT in the last 72 hours. Urinalysis    Component Value Date/Time   COLORURINE YELLOW (A) 06/10/2021 0204   APPEARANCEUR CLOUDY (A) 06/10/2021 0204   APPEARANCEUR Clear  06/10/2013 1212   LABSPEC 1.013 06/10/2021 0204   LABSPEC 1.019 06/10/2013 1212    PHURINE 6.0 06/10/2021 0204   GLUCOSEU NEGATIVE 06/10/2021 0204   GLUCOSEU Negative 06/10/2013 1212   HGBUR MODERATE (A) 06/10/2021 0204   BILIRUBINUR NEGATIVE 06/10/2021 0204   BILIRUBINUR Negative 06/10/2013 1212   KETONESUR 5 (A) 06/10/2021 0204   PROTEINUR 30 (A) 06/10/2021 0204   NITRITE POSITIVE (A) 06/10/2021 0204   LEUKOCYTESUR LARGE (A) 06/10/2021 0204   LEUKOCYTESUR Negative 06/10/2013 1212   Sepsis Labs Invalid input(s): PROCALCITONIN,  WBC,  LACTICIDVEN Microbiology Recent Results (from the past 240 hour(s))  Resp Panel by RT-PCR (Flu A&B, Covid) Nasopharyngeal Swab     Status: None   Collection Time: 06/10/21  2:04 AM   Specimen: Nasopharyngeal Swab; Nasopharyngeal(NP) swabs in vial transport medium  Result Value Ref Range Status   SARS Coronavirus 2 by RT PCR NEGATIVE NEGATIVE Final    Comment: (NOTE) SARS-CoV-2 target nucleic acids are NOT DETECTED.  The SARS-CoV-2 RNA is generally detectable in upper respiratory specimens during the acute phase of infection. The lowest concentration of SARS-CoV-2 viral copies this assay can detect is 138 copies/mL. A negative result does not preclude SARS-Cov-2 infection and should not be used as the sole basis for treatment or other patient management decisions. A negative result may occur with  improper specimen collection/handling, submission of specimen other than nasopharyngeal swab, presence of viral mutation(s) within the areas targeted by this assay, and inadequate number of viral copies(<138 copies/mL). A negative result must be combined with clinical observations, patient history, and epidemiological information. The expected result is Negative.  Fact Sheet for Patients:  BloggerCourse.com  Fact Sheet for Healthcare Providers:  SeriousBroker.it  This test is no t yet approved or cleared by the [place] FDA and  has been authorized for detection and/or diagnosis  of SARS-CoV-2 by FDA under an Emergency Use Authorization (EUA). This EUA will remain  in effect (meaning this test can be used) for the duration of the COVID-19 declaration under Section 564(b)(1) of the Act, 21 U.S.C.section 360bbb-3(b)(1), unless the authorization is terminated  or revoked sooner.       Influenza A by PCR NEGATIVE NEGATIVE Final   Influenza B by PCR NEGATIVE NEGATIVE Final    Comment: (NOTE) The Xpert Xpress SARS-CoV-2/FLU/RSV plus assay is intended as an aid in the diagnosis of influenza from Nasopharyngeal swab specimens and should not be used as a sole basis for treatment. Nasal washings and aspirates are unacceptable for Xpert Xpress SARS-CoV-2/FLU/RSV testing.  Fact Sheet for Patients: BloggerCourse.com  Fact Sheet for Healthcare Providers: SeriousBroker.it  This test is not yet approved or cleared by the [place] FDA and has been authorized for detection and/or diagnosis of SARS-CoV-2 by FDA under an Emergency Use Authorization (EUA). This EUA will remain in effect (meaning this test can be used) for the duration of the COVID-19 declaration under Section 564(b)(1) of the Act, 21 U.S.C. section 360bbb-3(b)(1), unless the authorization is terminated or revoked.  Performed at [facility], [address] [place], [place] [address]   Body fluid culture w Gram Stain     Status: None (Preliminary result)   Collection Time: 06/10/21  2:04 AM   Specimen: Body Fluid  Result Value Ref Range Status   Specimen Description   Final    FLUID SCROTUM Performed at [facility], [address], [place], [place] [address]    Special Requests   Final  NONE Performed at [facility], [address] [place], [place] [address]    Gram Stain   Final    ABUNDANT WBC PRESENT, PREDOMINANTLY PMN RARE GRAM POSITIVE COCCI IN PAIRS RARE GRAM POSITIVE RODS    Culture   Final    CULTURE  REINCUBATED FOR BETTER GROWTH Performed at [facility], [address], [place], [place] [address]    Report Status PENDING  Incomplete  Urine Culture     Status: Abnormal   Collection Time: 06/10/21  2:04 AM   Specimen: Urine, Random  Result Value Ref Range Status   Specimen Description   Final    URINE, RANDOM Performed at [facility], [address], [place], [place] [address]    Special Requests   Final    Normal Performed at [facility], [address] [place], [place] [address]    Culture MULTIPLE SPECIES PRESENT, SUGGEST RECOLLECTION (A)  Final   Report Status 06/12/2021 FINAL  Final     Time coordinating discharge: Over 30 minutes  SIGNED:   Pennie Banter, DO [facility] 06/12/2021, 11:31 AM   If 7PM-7AM, please contact night-coverage [email or web address]

## 2021-06-12 NOTE — TOC Transition Note (Signed)
Transition of Care [facility]) - CM/SW Discharge Note   Patient Details  Name: Thomas Tanner MRN: [id number] Date of Birth: 01/28/1951  [facility]) CM/SW Contact:  Maree Krabbe, LCSW Phone Number: 06/12/2021, 12:18 PM   Clinical Narrative:   HH will be resumed via [facility]. [facility] has been arranged. RN notified.    Final next level of care: Home w Home Health Services Barriers to Discharge: No Barriers Identified   Patient Goals and CMS Choice Patient states their goals for this hospitalization and ongoing recovery are:: "I want to be able to live my life again"      Discharge Placement                Patient to be transferred to facility by: [facility]   Patient and family notified of of transfer: 06/12/21  Discharge Plan and Services In-house Referral: Clinical Social Work   Post Acute Care Choice: Durable Medical Equipment (bariatric bed, wheelchair, walker)                    HH Arranged: PT, OT, RN HH Agency:  ([facility] to resume services post discharge) Date HH Agency Contacted: 06/11/21 (previously arranged and can resume) Time HH Agency Contacted: 1036 Representative spoke with at [facility] Agency: [place]  Social Determinants of Health (SDOH) Interventions     Readmission Risk Interventions No flowsheet data found.

## 2021-06-12 NOTE — TOC Progression Note (Signed)
Transition of Care [facility]) - Progression Note    Patient Details  Name: Thomas Tanner MRN: [id number] Date of Birth: Nov 29, 1951  Transition of Care [facility]) CM/SW Contact  Maree Krabbe, LCSW Phone Number: 06/12/2021, 11:20 AM  Clinical Narrative:  Pt is active with [facility]. [facility] is aware pt will d/c today. Will resume services.    Expected Discharge Plan: Home w Home Health Services Barriers to Discharge: Continued Medical Work up  Expected Discharge Plan and Services Expected Discharge Plan: Home w Home Health Services In-house Referral: Clinical Social Work   Post Acute Care Choice: Durable Medical Equipment (bariatric bed, wheelchair, walker) Living arrangements for the past 2 months: Single Family Home                           HH Arranged: PT, OT, RN [facility] Agency:  ([facility] to resume services post discharge) Date [facility] Agency Contacted: 06/11/21 (previously arranged and can resume) Time HH Agency Contacted: 1036 Representative spoke with at [facility] Agency: [place]   Social Determinants of Health (SDOH) Interventions    Readmission Risk Interventions No flowsheet data found.

## 2021-06-12 NOTE — Progress Notes (Signed)
Pt provided discharge instructions, all belongings sent home with pt. Spouse notified by pt that he is being discharge. Pt transported via EMS. VSS.    06/12/21 1422  Vitals  Temp 97.7 F (36.5 C)  Temp Source Oral  BP 118/67  BP Location Right Arm  BP Method Automatic  Patient Position (if appropriate) Lying  Pulse Rate 82  Pulse Rate Source Monitor  Resp 18  Level of Consciousness  Level of Consciousness Alert  MEWS COLOR  MEWS Score Color Green  Oxygen Therapy  SpO2 94 %  O2 Device Room Air  Patient Activity (if Appropriate) In bed

## 2021-06-12 NOTE — Progress Notes (Signed)
Patient had some pain overnight. He has been AFVSS, no leukocytosis on augmentin  I inspected the scotum. Wound bed with healthy granulation tissue. Testicle appears viable. Some tenderness but not much. Some surrounding scrotal erythema but no crepitus or fluctuance  OK to DC with augmentin

## 2021-06-13 ENCOUNTER — Telehealth: Payer: Self-pay

## 2021-06-13 ENCOUNTER — Other Ambulatory Visit: Payer: Self-pay | Admitting: Nurse Practitioner

## 2021-06-13 ENCOUNTER — Other Ambulatory Visit: Payer: Self-pay

## 2021-06-13 DIAGNOSIS — J4520 Mild intermittent asthma, uncomplicated: Secondary | ICD-10-CM

## 2021-06-13 DIAGNOSIS — I509 Heart failure, unspecified: Secondary | ICD-10-CM

## 2021-06-13 LAB — BODY FLUID CULTURE W GRAM STAIN

## 2021-06-13 NOTE — Telephone Encounter (Signed)
Pt called that we need to call [facility] for wound care order he just discharge from hospital I spoke with Glendora Score from [facility] she said she already have order they will call pt once they have schedule for nurse

## 2021-06-14 ENCOUNTER — Encounter: Payer: Self-pay | Admitting: *Deleted

## 2021-06-14 ENCOUNTER — Other Ambulatory Visit: Payer: Self-pay

## 2021-06-14 ENCOUNTER — Other Ambulatory Visit: Payer: Self-pay | Admitting: *Deleted

## 2021-06-14 DIAGNOSIS — Z09 Encounter for follow-up examination after completed treatment for conditions other than malignant neoplasm: Secondary | ICD-10-CM

## 2021-06-14 MED ORDER — APIXABAN 5 MG PO TABS: 5.0000 mg | ORAL_TABLET | Freq: Two times a day (BID) | ORAL | 1 refills | Status: DC

## 2021-06-14 NOTE — Patient Outreach (Signed)
[facility]) Care Management  [facility] Care Manager  06/14/2021   Thomas Tanner Aug 14, 1951 [id number]  Referral Date: 7/6 Referral Source: hospital liaison Referral Reason: post hospital follow up Insurance: [facility]   Outreach attempt #1, successful.  Identity verified.  This care manager introduced self and stated purpose of call.  [facility] care management services explained.    Social: Lives with wife, son, and grandson.  Report he is currently not able to perform ADL's independently but hoping with the help of PT this will change.  His son is also performing dressing changes to his scrotal wound, will have HH nurse to restart services by the end of the week through [facility].    Conditions: Per chart, has history of HTN, CHF, COPD with home O2 at night and as needed during the day, GERD, HLD, and obesity.  Medications: Reviewed with member, state he is taking as instructed.  Report his Eliquis is a little more expensive but he has worked to get this as cheap as possible.    Advance Directives: Does not currently have in place, declines paperwork stating he already has it.  Will complete with wife.  Consent: Agrees to [facility] involvement.  Denies any urgent concerns, encouraged to contact this care manager with questions.    Encounter Medications:  Outpatient Encounter Medications as of 06/14/2021  Medication Sig Note   acidophilus (RISAQUAD) CAPS capsule Take 1 capsule by mouth 3 (three) times daily for 14 days.    amoxicillin-clavulanate (AUGMENTIN) 875-125 MG tablet Take 1 tablet by mouth every 12 (twelve) hours for 5 days.    famotidine (PEPCID) 20 MG tablet Take 1 tablet (20 mg total) by mouth 2 (two) times daily.    iron polysaccharides (NIFEREX) 150 MG capsule Take 1 capsule (150 mg total) by mouth daily.    metoprolol succinate (TOPROL-XL) 50 MG 24 hr tablet Take 1 tablet by mouth once daily    phenylephrine-shark liver oil-mineral oil-petrolatum (PREPARATION  H) 0.25-14-74.9 % rectal ointment Place 1 application rectally 2 (two) times daily as needed for hemorrhoids.    rosuvastatin (CRESTOR) 20 MG tablet Take 1 tablet (20 mg total) by mouth daily.    senna-docusate (SENOKOT-S) 8.6-50 MG tablet Take 1 tablet by mouth 2 (two) times daily.    VENTOLIN HFA 108 (90 Base) MCG/ACT inhaler Inhale 2 puffs into the lungs every 6 (six) hours as needed for wheezing or shortness of breath. 08/12/2020: Reports uses prn- reports uses 1-2 times per day   vitamin B-12 1000 MCG tablet Take 1 tablet (1,000 mcg total) by mouth daily.    oxyCODONE-acetaminophen (PERCOCET/ROXICET) 5-325 MG tablet Take 1-2 tablets by mouth every 4 (four) hours as needed for up to 3 days for moderate pain or severe pain.    [DISCONTINUED] apixaban (ELIQUIS) 5 MG TABS tablet Take 1 tablet (5 mg total) by mouth 2 (two) times daily.    No facility-administered encounter medications on file as of 06/14/2021.    Functional Status:  In your present state of health, do you have any difficulty performing the following activities: 06/11/2021 05/05/2021  Hearing? Y N  Vision? Y N  Difficulty concentrating or making decisions? N N  Walking or climbing stairs? Y Y  Dressing or bathing? Y Y  Comment - -  Doing errands, shopping? Y N  Some recent data might be hidden    Fall/Depression Screening: Fall Risk  06/14/2021 05/30/2021 09/28/2020  Falls in the past year? 1 0 0  Number falls  in past yr: 0 - -  Injury with Fall? 1 - -  Comment - - -  Risk for fall due to : History of fall(s) No Fall Risks -  Risk for fall due to: Comment - - -  Follow up - Falls evaluation completed Falls evaluation completed   PHQ 2/9 Scores 06/14/2021 05/30/2021 11/07/2020 09/28/2020 08/16/2020 08/01/2020 08/01/2020  PHQ - 2 Score 1 6 0 0 0 0 0  PHQ- 9 Score - 13 - - - - -  Exception Documentation - - - - - - -  Not completed - - - - - - -    Assessment:   Care Plan Care Plan : General Plan of Care (Adult)  Updates made  by [name], RN since 06/14/2021 12:00 AM     Problem: Quality of Life (General Plan of Care)      Long-Range Goal: Quality of Life Maintained   Start Date: 06/14/2021  Expected End Date: 09/12/2021  Priority: High  Note:   Evidence-based guidance:  Assess patient's thoughts about quality of life, goals and expectations, and dissatisfaction or desire to improve.  Identify issues of primary importance such as mental health, illness, exercise tolerance, pain, sexual function and intimacy, cognitive change, social isolation, finances and relationships.  Assess and monitor for signs/symptoms of psychosocial concerns, especially depression or ideations regarding harm to others or self; provide or refer for mental health services as needed.  Identify sensory issues that impact quality of life such as hearing loss, vision deficit; strategize ways to maintain or improve hearing, vision.  Promote access to services in the community to support independence such as support groups, home visiting programs, financial assistance, handicapped parking tags, durable medical equipment and emergency responder.  Promote activities to decrease social isolation such as group support or social, leisure and recreational activities, employment, use of social media; consider safety concerns about being out of home for activities.  Provide patient an opportunity to share by storytelling or a "life review" to give positive meaning to life and to assist with coping and negative experiences.  Encourage patient to tap into hope to improve sense of self.  Counsel based on prognosis and as early as possible about end-of-life and palliative care; consider referral to palliative care provider.  Advocate for the development of palliative care plan that may include avoidance of unnecessary testing and intervention, symptom control, discontinuation of medications, hospice and organ donation.  Counsel as early as possible those with  life-limiting chronic disease about palliative care; consider referral to palliative care provider.  Advocate for the development of palliative care plan.   Notes:     Care Plan : Heart Failure (Adult)  Updates made by [name], RN since 06/14/2021 12:00 AM     Problem: Symptom Exacerbation (Heart Failure)   Priority: Medium     Long-Range Goal: Symptom Exacerbation Prevented or Minimized Completed 06/14/2021  Start Date: 10/05/2020  Expected End Date: 01/03/2021  Note:   Evidence-based guidance:  Perform or review cognitive and/or health literacy screening.  Assess understanding of adherence and barriers to treatment plan, as well as lifestyle changes; develop strategies to address barriers.  Establish a mutually-agreed-upon early intervention process to communicate with primary care provider when signs/symptoms worsen.  Facilitate timely posthospital discharge or emergency department treatment that includes intensive follow-up via telephone calls, home visit, telehealth monitoring and care at multidisciplinary heart failure clinic.  Adjust frequency and intensity of follow-up based on presentation, number of emergency department visits,  hospital admissions and frequency and severity of symptom exacerbation.  Facilitate timely visit, usually within 1 week, with primary care provider following hospital discharge.  Collaborate with clinical pharmacist to address adverse drug reactions, drug interactions, subtherapeutic dosage, patient and family education.  Regularly screen for presence of depressive symptoms using a validated tool; consider pharmacologic therapy and/or referral for cognitive behavioral therapy when present.  Refer to community-based services, such as a heart failure support group, community Medical laboratory scientific officer or peer support program.  Review immunization status; arrange receipt of needed vaccinations.  Prepare patient for home oxygen use based on signs/symptoms.   Notes:    7/6 - focus changed due to recent hospitalization    Task: Identify and Minimize Risk of Heart Failure Exacerbation Completed 06/14/2021  Due Date: 01/03/2021  Note:   Care Management Activities:    - barriers to lifestyle changes reviewed and addressed - barriers to treatment reviewed and addressed - healthy lifestyle promoted - medication-adherence assessment completed - rescue (action) plan reviewed - self-awareness of signs/symptoms of worsening disease encouraged    Notes:  10/05/20: -- Patient confirms he received bariatric scales form his insurance provider and is using for daily weights at home: daily weights reported by patient as 420 lbs consistently; reviewed weight gain guidelines in setting of CHF along with corresponding action plan -- confirmed patient continues to use home O2 as prescribed; using HS and intermittently (50%) during daytime hours: safe use of home O2 reviewed with patient -- confirmed no medication changes; patient continues self-managing and taking as prescribed -- reviewed with patient upcoming provider appointments and confirmed no transportation issues -- reiterated importance of smoking cessation- confirmed that patient understands to never smoke around home O2    Problem: Disease Progression (Heart Failure)   Priority: Medium     Long-Range Goal: Comorbidities Identified and Managed Completed 06/14/2021  Start Date: 10/05/2020  Expected End Date: 01/03/2021  Note:   Evidence-based guidance:  Assess and address signs/symptoms of comorbidity, including dyslipidemia, diabetes, iron deficiency, gout, arthritis, dysrhythmia, hypertension, cachexia, coronary artery disease, kidney dysfunction and lung disease.  Prepare patient for laboratory and diagnostic exams based on risk and presentation.  Prepare for use of pharmacologic therapy that may include statin, angiotensin converting enzyme (ACE) inhibitor, angiotensin receptor blocker (ARB),  beta-blocker, digoxin, antidysrhythmic, diuretic or omega-3 fatty acid.  Monitor side effects and anticipate need for periodic adjustments.  Prepare patient for potential invasive treatment, such as implantable cardioverter-defibrillator, cardiac resynchronization therapy or heart transplant as disease progresses.   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   Taking really good care of your skin will help to keep your skin unbroken.    Notes:   7[facility]6 [facility].  Son currently doing dressing changes every other day.  Confirms he is taking antibiotics for infection control      THN CM: Make and Keep All Appointments       Timeframe:  Short-Term Goal Priority:  Medium Start Date:       7/6                      Expected End Date:        8/6               Barriers: Transportation   Follow Up Date 06/30/2021   - ask family or friend for a ride - keep a calendar with appointment dates    Why is this important?   Part of staying healthy is seeing the doctor for follow-up care.  If you forget your appointments, there are some things you can do to stay on track.    Notes:   10/05/20:  --  Discussed with patient to follow up with care providers (CHF clinic/ cardiologist) to make next appointments -- reviewed with patient recent office visits with pulmonology and PCP  11/07/20: -- encouraged patient to make prompt appointment with CHF clinic to obtain bariatric scales, as discussed during telephone call 10/05/20: confirmed patient declines need for care coordination in scheduling appointment -- confirmed ongoing reliable transportation through family members -- reviewed upcoming scheduled provider appointments   GOALS UNMET-CASE CLOSURE   7/6 - Goal reactivated as case reopened - Reviewed upcoming appointments.  Discussed concern regarding inability to get to provider offices due to decreased strength.  Encouraged to make all appointments virtual until strength has been regained.  Has virtual visits with urology on 7/11 and plastic surgeon on 7/13.  Will call PCP after urology appointment to schedule       COMPLETED: THN CM: Protect My Health       Follow Up Date 12/06/2020    - schedule appointment for flu shot - schedule appointment for vaccines needed due to my age or health    Why is this important?   Screening tests can find diseases early when they are easier to treat.  Your doctor or nurse will talk with you about which tests are important for you.  Getting shots for common diseases like the flu and shingles will help prevent them.     Notes:   10/05/20: -- discussed with patient plan to obtain flu vaccine and corona virus vaccine  11/07/20: -- reiterated need to obtain flu and corona virus vaccines: confirmed patient verbalizes plans to obtain this week and declines care coordination needs for same -- confirmed ongoing use of home O2 at 2- 2.5 L/min via [place]:  -- confirmed patient does not smoke inside home: previously provided education around smoking cessation strategies discussed with patient/ safe use of home O2 discussed with patient -- confirmed no recent  or new falls: continues using walker for ambulation, working with home health PT  GOALS UNMET-CASE CLOSURE  7/6 - focus changed due to recent hospitalization      COMPLETED: THN CM: Track and Manage Fluids  and Swelling                   Follow Up Date 12/06/20    - call office if I gain more than 2 pounds in one day or 5 pounds in one week - do ankle pumps when sitting - keep legs up while sitting - track weight in diary - use salt in moderation - watch for swelling in feet, ankles and legs every day - weigh myself daily    Why is this important?   It is important to check your weight daily and watch how much salt and liquids you have.  It will help you to manage your heart failure.    Notes:  10/05/20: -- Continue daily weights; confirmed reported baseline weights at home consistently 420 lbs -- discussed signs/ symptoms CHF yellow zone  11/07/20: -- reviewed previously provided education for signs/ symptoms CHF yellow zone/ weight gain guidelines along with corresponding action plan- provided printed education in AVS -- encouraged patient to promptly obtain bariatric scales from CHF clinic- as previously coordinated: confirmed patient will call to schedule CHF clinic appointment today; confirmed patient will contact me for care coordination if/ as needed  GOALS UNMET-CASE CLOSURE  7/6 - focus changed due to recent hospitalization      COMPLETED: THN CM: Track and Manage Symptoms       Follow Up Date 12/06/20:   - develop a rescue plan - follow rescue plan if symptoms flare-up - know when to call the doctor - track symptoms and what helps feel better or worse - dress right for the weather, hot or cold    Why is this important?   You will be able to handle your symptoms better if you keep track of them.  Making some simple changes to your lifestyle will help.  Eating healthy is one thing you can do to take good care of yourself.    Notes:   11/07/20: -- discussed  current clinical condition with patient: confirmed no current clinical concerns -- confirmed no recent changes to medications/ no medication concerns: patient reports continues to self-manage  GOALS UNMET-CASE CLOSURE  7/6 - focus changed due to recent hospitalization         Plan:  Follow-up: Patient agrees to Care Plan and Follow-up. Follow-up in 2 week(s).  [name], [name], MSN [facility] Care Management  [facility] Manager [phone]

## 2021-06-15 DIAGNOSIS — I5032 Chronic diastolic (congestive) heart failure: Secondary | ICD-10-CM | POA: Diagnosis not present

## 2021-06-15 DIAGNOSIS — E871 Hypo-osmolality and hyponatremia: Secondary | ICD-10-CM | POA: Diagnosis not present

## 2021-06-15 DIAGNOSIS — R1310 Dysphagia, unspecified: Secondary | ICD-10-CM | POA: Diagnosis not present

## 2021-06-15 DIAGNOSIS — R532 Functional quadriplegia: Secondary | ICD-10-CM | POA: Diagnosis not present

## 2021-06-15 DIAGNOSIS — J449 Chronic obstructive pulmonary disease, unspecified: Secondary | ICD-10-CM | POA: Diagnosis not present

## 2021-06-15 DIAGNOSIS — Z7901 Long term (current) use of anticoagulants: Secondary | ICD-10-CM | POA: Diagnosis not present

## 2021-06-15 DIAGNOSIS — E872 Acidosis: Secondary | ICD-10-CM | POA: Diagnosis not present

## 2021-06-15 DIAGNOSIS — I110 Hypertensive heart disease with heart failure: Secondary | ICD-10-CM | POA: Diagnosis not present

## 2021-06-15 DIAGNOSIS — N179 Acute kidney failure, unspecified: Secondary | ICD-10-CM | POA: Diagnosis not present

## 2021-06-15 DIAGNOSIS — K5909 Other constipation: Secondary | ICD-10-CM | POA: Diagnosis not present

## 2021-06-15 DIAGNOSIS — I2510 Atherosclerotic heart disease of native coronary artery without angina pectoris: Secondary | ICD-10-CM | POA: Diagnosis not present

## 2021-06-15 DIAGNOSIS — J9611 Chronic respiratory failure with hypoxia: Secondary | ICD-10-CM | POA: Diagnosis not present

## 2021-06-15 DIAGNOSIS — D509 Iron deficiency anemia, unspecified: Secondary | ICD-10-CM | POA: Diagnosis not present

## 2021-06-15 DIAGNOSIS — A419 Sepsis, unspecified organism: Secondary | ICD-10-CM | POA: Diagnosis not present

## 2021-06-15 DIAGNOSIS — E785 Hyperlipidemia, unspecified: Secondary | ICD-10-CM | POA: Diagnosis not present

## 2021-06-15 NOTE — Telephone Encounter (Signed)
Noted. Patient currently has follow up appointment with Urology on 7/11.

## 2021-06-17 DIAGNOSIS — E785 Hyperlipidemia, unspecified: Secondary | ICD-10-CM | POA: Diagnosis not present

## 2021-06-17 DIAGNOSIS — K5909 Other constipation: Secondary | ICD-10-CM | POA: Diagnosis not present

## 2021-06-17 DIAGNOSIS — E871 Hypo-osmolality and hyponatremia: Secondary | ICD-10-CM | POA: Diagnosis not present

## 2021-06-17 DIAGNOSIS — Z7901 Long term (current) use of anticoagulants: Secondary | ICD-10-CM | POA: Diagnosis not present

## 2021-06-17 DIAGNOSIS — R1310 Dysphagia, unspecified: Secondary | ICD-10-CM | POA: Diagnosis not present

## 2021-06-17 DIAGNOSIS — I2510 Atherosclerotic heart disease of native coronary artery without angina pectoris: Secondary | ICD-10-CM | POA: Diagnosis not present

## 2021-06-17 DIAGNOSIS — A419 Sepsis, unspecified organism: Secondary | ICD-10-CM | POA: Diagnosis not present

## 2021-06-17 DIAGNOSIS — E872 Acidosis: Secondary | ICD-10-CM | POA: Diagnosis not present

## 2021-06-17 DIAGNOSIS — R532 Functional quadriplegia: Secondary | ICD-10-CM | POA: Diagnosis not present

## 2021-06-17 DIAGNOSIS — D509 Iron deficiency anemia, unspecified: Secondary | ICD-10-CM | POA: Diagnosis not present

## 2021-06-17 DIAGNOSIS — N179 Acute kidney failure, unspecified: Secondary | ICD-10-CM | POA: Diagnosis not present

## 2021-06-17 DIAGNOSIS — I110 Hypertensive heart disease with heart failure: Secondary | ICD-10-CM | POA: Diagnosis not present

## 2021-06-17 DIAGNOSIS — J449 Chronic obstructive pulmonary disease, unspecified: Secondary | ICD-10-CM | POA: Diagnosis not present

## 2021-06-17 DIAGNOSIS — J9611 Chronic respiratory failure with hypoxia: Secondary | ICD-10-CM | POA: Diagnosis not present

## 2021-06-17 DIAGNOSIS — I5032 Chronic diastolic (congestive) heart failure: Secondary | ICD-10-CM | POA: Diagnosis not present

## 2021-06-19 ENCOUNTER — Telehealth: Payer: Self-pay

## 2021-06-19 ENCOUNTER — Telehealth: Payer: Medicare Other | Admitting: Physician Assistant

## 2021-06-19 NOTE — Telephone Encounter (Signed)
Called pt per provider to inquire about his virtual visit scheduled for today. The patient was asked to activate his mychart account in order to upload photos of his wound for assessment. The patient has failed to do this. Upon calling the patient he states that he no longer needs this appointment as he has been set up with a home health nurse who is accessing the wound, as well as changing the dressing. Patient states things are going well. He does question when his next appointment for evaluation should be. Please advise.

## 2021-06-19 NOTE — Telephone Encounter (Signed)
Gave verbal order Thomas Tanner from [facility] [phone] continues Physical therapy and Nursing for wound care once a week for 7 weeks and PT once a week for 7 weeks

## 2021-06-20 ENCOUNTER — Other Ambulatory Visit: Payer: Self-pay

## 2021-06-20 ENCOUNTER — Telehealth: Payer: Self-pay

## 2021-06-20 DIAGNOSIS — J4520 Mild intermittent asthma, uncomplicated: Secondary | ICD-10-CM

## 2021-06-20 MED ORDER — ALBUTEROL SULFATE HFA 108 (90 BASE) MCG/ACT IN AERS: 2.0000 | INHALATION_SPRAY | Freq: Four times a day (QID) | RESPIRATORY_TRACT | 1 refills | Status: DC | PRN

## 2021-06-20 NOTE — Telephone Encounter (Signed)
Pt called and asked about his mattress for his hospital bed to see if he could get a new mattress, I asked pt to call the company who he received the bed from and ask them what he needs to be done to get a new mattress.  Pt uses [facility]

## 2021-06-20 NOTE — Telephone Encounter (Signed)
Tonya with [facility] [phone] nursing 1 week 1, 2 week 6, 1 prn, orders continued after hospital visit for dressing his scrotum, gave approval for verbal order

## 2021-06-21 ENCOUNTER — Telehealth: Payer: Self-pay

## 2021-06-21 ENCOUNTER — Institutional Professional Consult (permissible substitution): Payer: Medicare Other | Admitting: Plastic Surgery

## 2021-06-21 DIAGNOSIS — I110 Hypertensive heart disease with heart failure: Secondary | ICD-10-CM | POA: Diagnosis not present

## 2021-06-21 DIAGNOSIS — E785 Hyperlipidemia, unspecified: Secondary | ICD-10-CM | POA: Diagnosis not present

## 2021-06-21 DIAGNOSIS — R1310 Dysphagia, unspecified: Secondary | ICD-10-CM | POA: Diagnosis not present

## 2021-06-21 DIAGNOSIS — N179 Acute kidney failure, unspecified: Secondary | ICD-10-CM | POA: Diagnosis not present

## 2021-06-21 DIAGNOSIS — K5909 Other constipation: Secondary | ICD-10-CM | POA: Diagnosis not present

## 2021-06-21 DIAGNOSIS — R532 Functional quadriplegia: Secondary | ICD-10-CM | POA: Diagnosis not present

## 2021-06-21 DIAGNOSIS — Z7901 Long term (current) use of anticoagulants: Secondary | ICD-10-CM | POA: Diagnosis not present

## 2021-06-21 DIAGNOSIS — J449 Chronic obstructive pulmonary disease, unspecified: Secondary | ICD-10-CM | POA: Diagnosis not present

## 2021-06-21 DIAGNOSIS — E871 Hypo-osmolality and hyponatremia: Secondary | ICD-10-CM | POA: Diagnosis not present

## 2021-06-21 DIAGNOSIS — E872 Acidosis: Secondary | ICD-10-CM | POA: Diagnosis not present

## 2021-06-21 DIAGNOSIS — D509 Iron deficiency anemia, unspecified: Secondary | ICD-10-CM | POA: Diagnosis not present

## 2021-06-21 DIAGNOSIS — I5032 Chronic diastolic (congestive) heart failure: Secondary | ICD-10-CM | POA: Diagnosis not present

## 2021-06-21 DIAGNOSIS — I2510 Atherosclerotic heart disease of native coronary artery without angina pectoris: Secondary | ICD-10-CM | POA: Diagnosis not present

## 2021-06-21 DIAGNOSIS — J9611 Chronic respiratory failure with hypoxia: Secondary | ICD-10-CM | POA: Diagnosis not present

## 2021-06-21 DIAGNOSIS — A419 Sepsis, unspecified organism: Secondary | ICD-10-CM | POA: Diagnosis not present

## 2021-06-21 NOTE — Telephone Encounter (Signed)
Gave verbal order for physical therapy [phone] once a week for 7 weeks

## 2021-06-23 DIAGNOSIS — I2510 Atherosclerotic heart disease of native coronary artery without angina pectoris: Secondary | ICD-10-CM | POA: Diagnosis not present

## 2021-06-23 DIAGNOSIS — J9611 Chronic respiratory failure with hypoxia: Secondary | ICD-10-CM | POA: Diagnosis not present

## 2021-06-23 DIAGNOSIS — R532 Functional quadriplegia: Secondary | ICD-10-CM | POA: Diagnosis not present

## 2021-06-23 DIAGNOSIS — I5032 Chronic diastolic (congestive) heart failure: Secondary | ICD-10-CM | POA: Diagnosis not present

## 2021-06-23 DIAGNOSIS — E785 Hyperlipidemia, unspecified: Secondary | ICD-10-CM | POA: Diagnosis not present

## 2021-06-23 DIAGNOSIS — Z7901 Long term (current) use of anticoagulants: Secondary | ICD-10-CM | POA: Diagnosis not present

## 2021-06-23 DIAGNOSIS — E871 Hypo-osmolality and hyponatremia: Secondary | ICD-10-CM | POA: Diagnosis not present

## 2021-06-23 DIAGNOSIS — I110 Hypertensive heart disease with heart failure: Secondary | ICD-10-CM | POA: Diagnosis not present

## 2021-06-23 DIAGNOSIS — E872 Acidosis: Secondary | ICD-10-CM | POA: Diagnosis not present

## 2021-06-23 DIAGNOSIS — A419 Sepsis, unspecified organism: Secondary | ICD-10-CM | POA: Diagnosis not present

## 2021-06-23 DIAGNOSIS — N179 Acute kidney failure, unspecified: Secondary | ICD-10-CM | POA: Diagnosis not present

## 2021-06-23 DIAGNOSIS — R1310 Dysphagia, unspecified: Secondary | ICD-10-CM | POA: Diagnosis not present

## 2021-06-23 DIAGNOSIS — K5909 Other constipation: Secondary | ICD-10-CM | POA: Diagnosis not present

## 2021-06-23 DIAGNOSIS — J449 Chronic obstructive pulmonary disease, unspecified: Secondary | ICD-10-CM | POA: Diagnosis not present

## 2021-06-23 DIAGNOSIS — D509 Iron deficiency anemia, unspecified: Secondary | ICD-10-CM | POA: Diagnosis not present

## 2021-06-26 ENCOUNTER — Telehealth: Payer: Self-pay

## 2021-06-26 DIAGNOSIS — J9611 Chronic respiratory failure with hypoxia: Secondary | ICD-10-CM | POA: Diagnosis not present

## 2021-06-26 DIAGNOSIS — E872 Acidosis: Secondary | ICD-10-CM | POA: Diagnosis not present

## 2021-06-26 DIAGNOSIS — I2510 Atherosclerotic heart disease of native coronary artery without angina pectoris: Secondary | ICD-10-CM | POA: Diagnosis not present

## 2021-06-26 DIAGNOSIS — R532 Functional quadriplegia: Secondary | ICD-10-CM | POA: Diagnosis not present

## 2021-06-26 DIAGNOSIS — A419 Sepsis, unspecified organism: Secondary | ICD-10-CM | POA: Diagnosis not present

## 2021-06-26 DIAGNOSIS — I110 Hypertensive heart disease with heart failure: Secondary | ICD-10-CM | POA: Diagnosis not present

## 2021-06-26 DIAGNOSIS — K5909 Other constipation: Secondary | ICD-10-CM | POA: Diagnosis not present

## 2021-06-26 DIAGNOSIS — I5032 Chronic diastolic (congestive) heart failure: Secondary | ICD-10-CM | POA: Diagnosis not present

## 2021-06-26 DIAGNOSIS — N179 Acute kidney failure, unspecified: Secondary | ICD-10-CM | POA: Diagnosis not present

## 2021-06-26 DIAGNOSIS — R1310 Dysphagia, unspecified: Secondary | ICD-10-CM | POA: Diagnosis not present

## 2021-06-26 DIAGNOSIS — J449 Chronic obstructive pulmonary disease, unspecified: Secondary | ICD-10-CM | POA: Diagnosis not present

## 2021-06-26 DIAGNOSIS — D509 Iron deficiency anemia, unspecified: Secondary | ICD-10-CM | POA: Diagnosis not present

## 2021-06-26 DIAGNOSIS — E871 Hypo-osmolality and hyponatremia: Secondary | ICD-10-CM | POA: Diagnosis not present

## 2021-06-26 DIAGNOSIS — E785 Hyperlipidemia, unspecified: Secondary | ICD-10-CM | POA: Diagnosis not present

## 2021-06-26 DIAGNOSIS — Z7901 Long term (current) use of anticoagulants: Secondary | ICD-10-CM | POA: Diagnosis not present

## 2021-06-26 NOTE — Telephone Encounter (Signed)
Pt called back about him needing a new mattress for his hospital bed, he stated that [facility] only has the type he has and is needing a better on due to it feeling like he sleeps on rocks.  I advised pt that he may need to call some of the medical supply companies and see what is required for him to get a new one.  I advised pt that he will probably need a face to face appt and once he gets information on the mattress he needs to call us back to make appt.

## 2021-06-26 NOTE — Telephone Encounter (Signed)
Incoming call from pt's home health nurse who requests verbal orders for pt's monthly catheter exchange. Thomas Malling, RN that we would not be able to give orders for long term cath care as pt was supposed to have cath in for 1 week until wound check. However pt has failed to make it to any of his scheduled appointments and has not activated mychart for virtual visit. The patient's wound will need to be accessed before any orders can be given.

## 2021-06-27 DIAGNOSIS — N179 Acute kidney failure, unspecified: Secondary | ICD-10-CM | POA: Diagnosis not present

## 2021-06-27 DIAGNOSIS — I2510 Atherosclerotic heart disease of native coronary artery without angina pectoris: Secondary | ICD-10-CM | POA: Diagnosis not present

## 2021-06-27 DIAGNOSIS — K5909 Other constipation: Secondary | ICD-10-CM | POA: Diagnosis not present

## 2021-06-27 DIAGNOSIS — Z7901 Long term (current) use of anticoagulants: Secondary | ICD-10-CM | POA: Diagnosis not present

## 2021-06-27 DIAGNOSIS — J9611 Chronic respiratory failure with hypoxia: Secondary | ICD-10-CM | POA: Diagnosis not present

## 2021-06-27 DIAGNOSIS — R532 Functional quadriplegia: Secondary | ICD-10-CM | POA: Diagnosis not present

## 2021-06-27 DIAGNOSIS — I110 Hypertensive heart disease with heart failure: Secondary | ICD-10-CM | POA: Diagnosis not present

## 2021-06-27 DIAGNOSIS — A419 Sepsis, unspecified organism: Secondary | ICD-10-CM | POA: Diagnosis not present

## 2021-06-27 DIAGNOSIS — E871 Hypo-osmolality and hyponatremia: Secondary | ICD-10-CM | POA: Diagnosis not present

## 2021-06-27 DIAGNOSIS — D509 Iron deficiency anemia, unspecified: Secondary | ICD-10-CM | POA: Diagnosis not present

## 2021-06-27 DIAGNOSIS — I5032 Chronic diastolic (congestive) heart failure: Secondary | ICD-10-CM | POA: Diagnosis not present

## 2021-06-27 DIAGNOSIS — E785 Hyperlipidemia, unspecified: Secondary | ICD-10-CM | POA: Diagnosis not present

## 2021-06-27 DIAGNOSIS — E872 Acidosis: Secondary | ICD-10-CM | POA: Diagnosis not present

## 2021-06-27 DIAGNOSIS — R1310 Dysphagia, unspecified: Secondary | ICD-10-CM | POA: Diagnosis not present

## 2021-06-27 DIAGNOSIS — J449 Chronic obstructive pulmonary disease, unspecified: Secondary | ICD-10-CM | POA: Diagnosis not present

## 2021-06-27 NOTE — Telephone Encounter (Signed)
Patient still has not activated MyChart to upload photos of his wound for further evaluation despite being counseled to do so on several occasions. I am unable to evaluate the patient further at this time if he is unable to come to clinic, per his prior reports, or provide any sort of documentation for my review of his healing process.   Furthermore, his Foley catheter was placed for wound management reasons, not for urinary retention, and prolonged Foley catheterization will increase his risk of urinary infection. I anticipated discontinuing his Foley catheter by now, but given that he did not follow up with plastic surgery as scheduled and I am unable to assess his wound, I am also unable to determine if Foley should be continued or discontinued at this point.  Please contact the patient and again urge him to activate MyChart so that we may assess his wound via photograph. Patient has stated he has absolutely no way of coming to clinic and canceled his last scheduled appointment with me for this reason, so I am unsure how to answer his question about when his next appointment should be. To summarize, this appointment should already have occurred, but his lack of responsiveness is making it impossible to appropriately assess him.

## 2021-06-30 ENCOUNTER — Telehealth: Payer: Self-pay | Admitting: Physician Assistant

## 2021-06-30 ENCOUNTER — Other Ambulatory Visit: Payer: Self-pay | Admitting: *Deleted

## 2021-06-30 DIAGNOSIS — I2510 Atherosclerotic heart disease of native coronary artery without angina pectoris: Secondary | ICD-10-CM | POA: Diagnosis not present

## 2021-06-30 DIAGNOSIS — E871 Hypo-osmolality and hyponatremia: Secondary | ICD-10-CM | POA: Diagnosis not present

## 2021-06-30 DIAGNOSIS — K5909 Other constipation: Secondary | ICD-10-CM | POA: Diagnosis not present

## 2021-06-30 DIAGNOSIS — N493 Fournier gangrene: Secondary | ICD-10-CM

## 2021-06-30 DIAGNOSIS — I5032 Chronic diastolic (congestive) heart failure: Secondary | ICD-10-CM | POA: Diagnosis not present

## 2021-06-30 DIAGNOSIS — N179 Acute kidney failure, unspecified: Secondary | ICD-10-CM | POA: Diagnosis not present

## 2021-06-30 DIAGNOSIS — E785 Hyperlipidemia, unspecified: Secondary | ICD-10-CM | POA: Diagnosis not present

## 2021-06-30 DIAGNOSIS — R532 Functional quadriplegia: Secondary | ICD-10-CM | POA: Diagnosis not present

## 2021-06-30 DIAGNOSIS — A419 Sepsis, unspecified organism: Secondary | ICD-10-CM | POA: Diagnosis not present

## 2021-06-30 DIAGNOSIS — I110 Hypertensive heart disease with heart failure: Secondary | ICD-10-CM | POA: Diagnosis not present

## 2021-06-30 DIAGNOSIS — E872 Acidosis: Secondary | ICD-10-CM | POA: Diagnosis not present

## 2021-06-30 DIAGNOSIS — Z7901 Long term (current) use of anticoagulants: Secondary | ICD-10-CM | POA: Diagnosis not present

## 2021-06-30 DIAGNOSIS — R1310 Dysphagia, unspecified: Secondary | ICD-10-CM | POA: Diagnosis not present

## 2021-06-30 DIAGNOSIS — J449 Chronic obstructive pulmonary disease, unspecified: Secondary | ICD-10-CM | POA: Diagnosis not present

## 2021-06-30 DIAGNOSIS — D509 Iron deficiency anemia, unspecified: Secondary | ICD-10-CM | POA: Diagnosis not present

## 2021-06-30 DIAGNOSIS — J9611 Chronic respiratory failure with hypoxia: Secondary | ICD-10-CM | POA: Diagnosis not present

## 2021-06-30 MED ORDER — SULFAMETHOXAZOLE-TRIMETHOPRIM 800-160 MG PO TABS: 1.0000 | ORAL_TABLET | Freq: Two times a day (BID) | ORAL | 0 refills | Status: AC

## 2021-06-30 NOTE — Patient Outreach (Signed)
[name]) Care Management  06/30/2021  Thomas Tanner 1951/06/08 [id number]   Outgoing call placed to member, state he is improving despite not following up urology.  Report he is increasing in strength with PT, will work on climbing stairs next week.  Verbalizes understanding regarding risk of infection with foley catheter remaining and the need for urology follow up.  State he will call as soon as possible to activate My Chart and reschedule visit.  Denies any urgent concerns, encouraged to contact this care manager with questions.  Agrees to follow up within the next month.   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   Taking really good care of your skin will help to keep your skin unbroken.    Notes:   7/6 - Nurse from [facility] will be making home visits for wound care.  Son currently doing dressing changes every other day.  Confirms he is taking antibiotics for infection control  7/22 - Confirms that HH has started and wound is improving.  Has appointment with plastic surgeon on 7/28     [facility] CM: Make and Keep All Appointments   Not on track    Timeframe:  Short-Term Goal Priority:  Medium Start Date:       7/6                      Expected End Date:        8/6               Barriers: Transportation   Follow Up Date 06/30/2021   - ask family or friend for a ride - keep a calendar with appointment dates    Why is this important?   Part of staying healthy is seeing the doctor for follow-up care.  If you forget your appointments, there are  some things you can do to stay on track.    Notes:   10/05/20:  -- Discussed with patient to follow up with care providers (CHF clinic/ cardiologist) to make next appointments -- reviewed with patient recent office visits with pulmonology and PCP  11/07/20: -- encouraged patient to make prompt appointment with CHF clinic to obtain bariatric scales, as discussed during telephone call 10/05/20: confirmed patient declines need for care coordination in scheduling appointment -- confirmed ongoing reliable transportation through family members -- reviewed upcoming scheduled provider appointments   GOALS UNMET-CASE CLOSURE   7/6 - Goal reactivated as case reopened - Reviewed upcoming appointments.  Discussed concern regarding inability to get to provider offices due to decreased strength.  Encouraged to make all appointments virtual until strength has been regained.  Has virtual visits with urology on 7/11 and plastic surgeon on 7/13.  Will  call PCP after urology appointment to schedule  7/22 - Canceled virtual appointment with urology.  Advised of importance due to wound and foley catheter in place.  Advised of the need to activate My Chart in effort to upload pictures and complete virtual visit.       [name], [name], MSN [facility] Care Management  [facility] Manager [phone]

## 2021-06-30 NOTE — Telephone Encounter (Signed)
In consultation with Dr. Richardo Hanks, I'd like to start him on Bactrim DS BID x3 days to sterilize the urine and proceed with Foley catheter removal to reduce his risk for urinary infection. I'd like him to get at least one dose of antibiotic prior to Foley removal. Home health may continue with wound care as planned. Please coordinate with home health as needed.

## 2021-06-30 NOTE — Telephone Encounter (Signed)
Left Trey Paula (home health nurse) a message in regards to message below. Seeking patients pharmacy information.

## 2021-06-30 NOTE — Telephone Encounter (Signed)
Spoke with Trey Paula with home health nursing. Advised him of the message below. Explained to him patients noncompliance with appts and activating his mychart to send in wound pictures. Trey Paula has offered to send Sam pictures of patients wound, email address given. Trey Paula unsure of patients preference of pharmacy, advised me to call patient.   Called patient, verified pharmacy, ABX sent, advised patient of Rx, he verbalized understanding.

## 2021-06-30 NOTE — Addendum Note (Signed)
Addended by: Lizbeth Bark on: 06/30/2021 03:38 PM   Modules accepted: Orders

## 2021-07-01 DIAGNOSIS — J449 Chronic obstructive pulmonary disease, unspecified: Secondary | ICD-10-CM | POA: Diagnosis not present

## 2021-07-04 DIAGNOSIS — I2510 Atherosclerotic heart disease of native coronary artery without angina pectoris: Secondary | ICD-10-CM | POA: Diagnosis not present

## 2021-07-04 DIAGNOSIS — A419 Sepsis, unspecified organism: Secondary | ICD-10-CM | POA: Diagnosis not present

## 2021-07-04 DIAGNOSIS — E871 Hypo-osmolality and hyponatremia: Secondary | ICD-10-CM | POA: Diagnosis not present

## 2021-07-04 DIAGNOSIS — E785 Hyperlipidemia, unspecified: Secondary | ICD-10-CM | POA: Diagnosis not present

## 2021-07-04 DIAGNOSIS — J9611 Chronic respiratory failure with hypoxia: Secondary | ICD-10-CM | POA: Diagnosis not present

## 2021-07-04 DIAGNOSIS — I110 Hypertensive heart disease with heart failure: Secondary | ICD-10-CM | POA: Diagnosis not present

## 2021-07-04 DIAGNOSIS — Z7901 Long term (current) use of anticoagulants: Secondary | ICD-10-CM | POA: Diagnosis not present

## 2021-07-04 DIAGNOSIS — R532 Functional quadriplegia: Secondary | ICD-10-CM | POA: Diagnosis not present

## 2021-07-04 DIAGNOSIS — R1310 Dysphagia, unspecified: Secondary | ICD-10-CM | POA: Diagnosis not present

## 2021-07-04 DIAGNOSIS — E872 Acidosis: Secondary | ICD-10-CM | POA: Diagnosis not present

## 2021-07-04 DIAGNOSIS — I5032 Chronic diastolic (congestive) heart failure: Secondary | ICD-10-CM | POA: Diagnosis not present

## 2021-07-04 DIAGNOSIS — N179 Acute kidney failure, unspecified: Secondary | ICD-10-CM | POA: Diagnosis not present

## 2021-07-04 DIAGNOSIS — K5909 Other constipation: Secondary | ICD-10-CM | POA: Diagnosis not present

## 2021-07-04 DIAGNOSIS — D509 Iron deficiency anemia, unspecified: Secondary | ICD-10-CM | POA: Diagnosis not present

## 2021-07-04 DIAGNOSIS — J449 Chronic obstructive pulmonary disease, unspecified: Secondary | ICD-10-CM | POA: Diagnosis not present

## 2021-07-05 DIAGNOSIS — J449 Chronic obstructive pulmonary disease, unspecified: Secondary | ICD-10-CM | POA: Diagnosis not present

## 2021-07-05 DIAGNOSIS — I5032 Chronic diastolic (congestive) heart failure: Secondary | ICD-10-CM | POA: Diagnosis not present

## 2021-07-05 DIAGNOSIS — E871 Hypo-osmolality and hyponatremia: Secondary | ICD-10-CM | POA: Diagnosis not present

## 2021-07-05 DIAGNOSIS — R532 Functional quadriplegia: Secondary | ICD-10-CM | POA: Diagnosis not present

## 2021-07-05 DIAGNOSIS — R1310 Dysphagia, unspecified: Secondary | ICD-10-CM | POA: Diagnosis not present

## 2021-07-05 DIAGNOSIS — E785 Hyperlipidemia, unspecified: Secondary | ICD-10-CM | POA: Diagnosis not present

## 2021-07-05 DIAGNOSIS — E872 Acidosis: Secondary | ICD-10-CM | POA: Diagnosis not present

## 2021-07-05 DIAGNOSIS — N179 Acute kidney failure, unspecified: Secondary | ICD-10-CM | POA: Diagnosis not present

## 2021-07-05 DIAGNOSIS — K5909 Other constipation: Secondary | ICD-10-CM | POA: Diagnosis not present

## 2021-07-05 DIAGNOSIS — D509 Iron deficiency anemia, unspecified: Secondary | ICD-10-CM | POA: Diagnosis not present

## 2021-07-05 DIAGNOSIS — I2510 Atherosclerotic heart disease of native coronary artery without angina pectoris: Secondary | ICD-10-CM | POA: Diagnosis not present

## 2021-07-05 DIAGNOSIS — A419 Sepsis, unspecified organism: Secondary | ICD-10-CM | POA: Diagnosis not present

## 2021-07-05 DIAGNOSIS — I110 Hypertensive heart disease with heart failure: Secondary | ICD-10-CM | POA: Diagnosis not present

## 2021-07-05 DIAGNOSIS — J9611 Chronic respiratory failure with hypoxia: Secondary | ICD-10-CM | POA: Diagnosis not present

## 2021-07-05 DIAGNOSIS — Z7901 Long term (current) use of anticoagulants: Secondary | ICD-10-CM | POA: Diagnosis not present

## 2021-07-06 ENCOUNTER — Institutional Professional Consult (permissible substitution): Payer: Medicare Other | Admitting: Plastic Surgery

## 2021-07-07 DIAGNOSIS — J449 Chronic obstructive pulmonary disease, unspecified: Secondary | ICD-10-CM | POA: Diagnosis not present

## 2021-07-07 DIAGNOSIS — E871 Hypo-osmolality and hyponatremia: Secondary | ICD-10-CM | POA: Diagnosis not present

## 2021-07-07 DIAGNOSIS — R1310 Dysphagia, unspecified: Secondary | ICD-10-CM | POA: Diagnosis not present

## 2021-07-07 DIAGNOSIS — E785 Hyperlipidemia, unspecified: Secondary | ICD-10-CM | POA: Diagnosis not present

## 2021-07-07 DIAGNOSIS — K5909 Other constipation: Secondary | ICD-10-CM | POA: Diagnosis not present

## 2021-07-07 DIAGNOSIS — Z7901 Long term (current) use of anticoagulants: Secondary | ICD-10-CM | POA: Diagnosis not present

## 2021-07-07 DIAGNOSIS — N179 Acute kidney failure, unspecified: Secondary | ICD-10-CM | POA: Diagnosis not present

## 2021-07-07 DIAGNOSIS — I110 Hypertensive heart disease with heart failure: Secondary | ICD-10-CM | POA: Diagnosis not present

## 2021-07-07 DIAGNOSIS — R532 Functional quadriplegia: Secondary | ICD-10-CM | POA: Diagnosis not present

## 2021-07-07 DIAGNOSIS — I2510 Atherosclerotic heart disease of native coronary artery without angina pectoris: Secondary | ICD-10-CM | POA: Diagnosis not present

## 2021-07-07 DIAGNOSIS — D509 Iron deficiency anemia, unspecified: Secondary | ICD-10-CM | POA: Diagnosis not present

## 2021-07-07 DIAGNOSIS — J9611 Chronic respiratory failure with hypoxia: Secondary | ICD-10-CM | POA: Diagnosis not present

## 2021-07-07 DIAGNOSIS — A419 Sepsis, unspecified organism: Secondary | ICD-10-CM | POA: Diagnosis not present

## 2021-07-07 DIAGNOSIS — E872 Acidosis: Secondary | ICD-10-CM | POA: Diagnosis not present

## 2021-07-07 DIAGNOSIS — I5032 Chronic diastolic (congestive) heart failure: Secondary | ICD-10-CM | POA: Diagnosis not present

## 2021-07-08 DIAGNOSIS — J9601 Acute respiratory failure with hypoxia: Secondary | ICD-10-CM | POA: Diagnosis not present

## 2021-07-08 DIAGNOSIS — J449 Chronic obstructive pulmonary disease, unspecified: Secondary | ICD-10-CM | POA: Diagnosis not present

## 2021-07-10 DIAGNOSIS — N179 Acute kidney failure, unspecified: Secondary | ICD-10-CM | POA: Diagnosis not present

## 2021-07-10 DIAGNOSIS — Z7901 Long term (current) use of anticoagulants: Secondary | ICD-10-CM | POA: Diagnosis not present

## 2021-07-10 DIAGNOSIS — I110 Hypertensive heart disease with heart failure: Secondary | ICD-10-CM | POA: Diagnosis not present

## 2021-07-10 DIAGNOSIS — R532 Functional quadriplegia: Secondary | ICD-10-CM | POA: Diagnosis not present

## 2021-07-10 DIAGNOSIS — K5909 Other constipation: Secondary | ICD-10-CM | POA: Diagnosis not present

## 2021-07-10 DIAGNOSIS — J449 Chronic obstructive pulmonary disease, unspecified: Secondary | ICD-10-CM | POA: Diagnosis not present

## 2021-07-10 DIAGNOSIS — A419 Sepsis, unspecified organism: Secondary | ICD-10-CM | POA: Diagnosis not present

## 2021-07-10 DIAGNOSIS — R1310 Dysphagia, unspecified: Secondary | ICD-10-CM | POA: Diagnosis not present

## 2021-07-10 DIAGNOSIS — E872 Acidosis: Secondary | ICD-10-CM | POA: Diagnosis not present

## 2021-07-10 DIAGNOSIS — I5032 Chronic diastolic (congestive) heart failure: Secondary | ICD-10-CM | POA: Diagnosis not present

## 2021-07-10 DIAGNOSIS — I2510 Atherosclerotic heart disease of native coronary artery without angina pectoris: Secondary | ICD-10-CM | POA: Diagnosis not present

## 2021-07-10 DIAGNOSIS — D509 Iron deficiency anemia, unspecified: Secondary | ICD-10-CM | POA: Diagnosis not present

## 2021-07-10 DIAGNOSIS — E871 Hypo-osmolality and hyponatremia: Secondary | ICD-10-CM | POA: Diagnosis not present

## 2021-07-10 DIAGNOSIS — J9611 Chronic respiratory failure with hypoxia: Secondary | ICD-10-CM | POA: Diagnosis not present

## 2021-07-10 DIAGNOSIS — E785 Hyperlipidemia, unspecified: Secondary | ICD-10-CM | POA: Diagnosis not present

## 2021-07-11 ENCOUNTER — Telehealth: Payer: Self-pay | Admitting: Internal Medicine

## 2021-07-11 NOTE — Chronic Care Management (AMB) (Signed)
  Chronic Care Management   Outreach Note  07/11/2021 Name: Thomas Tanner MRN: [id number] DOB: 1951-10-24  Referred by: Lyndon Code, MD Reason for referral : No chief complaint on file.   An unsuccessful telephone outreach was attempted today. The patient was referred to the pharmacist for assistance with care management and care coordination.   Follow Up Plan:   Tatjana Dellinger [facility] Scheduler

## 2021-07-11 NOTE — Chronic Care Management (AMB) (Signed)
  Chronic Care Management   Note  07/11/2021 Name: Thomas Tanner MRN: [id number] DOB: [birth year]  Thomas Tanner is a 70 y.o. year old male who is a primary care patient of Lyndon Code, MD. I reached out to Thomas Tanner by phone today in response to a referral sent by Thomas Tanner's PCP, Lyndon Code, MD.   Thomas Tanner was given information about Chronic Care Management services today including:  CCM service includes personalized support from designated clinical staff supervised by his physician, including individualized plan of care and coordination with other care providers 24/7 contact phone numbers for assistance for urgent and routine care needs. Service will only be billed when office clinical staff spend 20 minutes or more in a month to coordinate care. Only one practitioner may furnish and bill the service in a calendar month. The patient may stop CCM services at any time (effective at the end of the month) by phone call to the office staff.   Patient agreed to services and verbal consent obtained.   Follow up plan:   [employer] manager, [employer] food

## 2021-07-12 DIAGNOSIS — K5909 Other constipation: Secondary | ICD-10-CM | POA: Diagnosis not present

## 2021-07-12 DIAGNOSIS — I110 Hypertensive heart disease with heart failure: Secondary | ICD-10-CM | POA: Diagnosis not present

## 2021-07-12 DIAGNOSIS — E871 Hypo-osmolality and hyponatremia: Secondary | ICD-10-CM | POA: Diagnosis not present

## 2021-07-12 DIAGNOSIS — J9611 Chronic respiratory failure with hypoxia: Secondary | ICD-10-CM | POA: Diagnosis not present

## 2021-07-12 DIAGNOSIS — I5032 Chronic diastolic (congestive) heart failure: Secondary | ICD-10-CM | POA: Diagnosis not present

## 2021-07-12 DIAGNOSIS — A419 Sepsis, unspecified organism: Secondary | ICD-10-CM | POA: Diagnosis not present

## 2021-07-12 DIAGNOSIS — E785 Hyperlipidemia, unspecified: Secondary | ICD-10-CM | POA: Diagnosis not present

## 2021-07-12 DIAGNOSIS — R532 Functional quadriplegia: Secondary | ICD-10-CM | POA: Diagnosis not present

## 2021-07-12 DIAGNOSIS — J449 Chronic obstructive pulmonary disease, unspecified: Secondary | ICD-10-CM | POA: Diagnosis not present

## 2021-07-12 DIAGNOSIS — D509 Iron deficiency anemia, unspecified: Secondary | ICD-10-CM | POA: Diagnosis not present

## 2021-07-12 DIAGNOSIS — E872 Acidosis: Secondary | ICD-10-CM | POA: Diagnosis not present

## 2021-07-12 DIAGNOSIS — I2510 Atherosclerotic heart disease of native coronary artery without angina pectoris: Secondary | ICD-10-CM | POA: Diagnosis not present

## 2021-07-12 DIAGNOSIS — Z7901 Long term (current) use of anticoagulants: Secondary | ICD-10-CM | POA: Diagnosis not present

## 2021-07-12 DIAGNOSIS — N179 Acute kidney failure, unspecified: Secondary | ICD-10-CM | POA: Diagnosis not present

## 2021-07-12 DIAGNOSIS — R1310 Dysphagia, unspecified: Secondary | ICD-10-CM | POA: Diagnosis not present

## 2021-07-13 DIAGNOSIS — K5909 Other constipation: Secondary | ICD-10-CM | POA: Diagnosis not present

## 2021-07-13 DIAGNOSIS — I2510 Atherosclerotic heart disease of native coronary artery without angina pectoris: Secondary | ICD-10-CM | POA: Diagnosis not present

## 2021-07-13 DIAGNOSIS — R532 Functional quadriplegia: Secondary | ICD-10-CM | POA: Diagnosis not present

## 2021-07-13 DIAGNOSIS — E871 Hypo-osmolality and hyponatremia: Secondary | ICD-10-CM | POA: Diagnosis not present

## 2021-07-13 DIAGNOSIS — D509 Iron deficiency anemia, unspecified: Secondary | ICD-10-CM | POA: Diagnosis not present

## 2021-07-13 DIAGNOSIS — N179 Acute kidney failure, unspecified: Secondary | ICD-10-CM | POA: Diagnosis not present

## 2021-07-13 DIAGNOSIS — E872 Acidosis: Secondary | ICD-10-CM | POA: Diagnosis not present

## 2021-07-13 DIAGNOSIS — R1310 Dysphagia, unspecified: Secondary | ICD-10-CM | POA: Diagnosis not present

## 2021-07-13 DIAGNOSIS — A419 Sepsis, unspecified organism: Secondary | ICD-10-CM | POA: Diagnosis not present

## 2021-07-13 DIAGNOSIS — E785 Hyperlipidemia, unspecified: Secondary | ICD-10-CM | POA: Diagnosis not present

## 2021-07-13 DIAGNOSIS — J449 Chronic obstructive pulmonary disease, unspecified: Secondary | ICD-10-CM | POA: Diagnosis not present

## 2021-07-13 DIAGNOSIS — J9611 Chronic respiratory failure with hypoxia: Secondary | ICD-10-CM | POA: Diagnosis not present

## 2021-07-13 DIAGNOSIS — I5032 Chronic diastolic (congestive) heart failure: Secondary | ICD-10-CM | POA: Diagnosis not present

## 2021-07-13 DIAGNOSIS — Z7901 Long term (current) use of anticoagulants: Secondary | ICD-10-CM | POA: Diagnosis not present

## 2021-07-13 DIAGNOSIS — I110 Hypertensive heart disease with heart failure: Secondary | ICD-10-CM | POA: Diagnosis not present

## 2021-07-17 DIAGNOSIS — E785 Hyperlipidemia, unspecified: Secondary | ICD-10-CM | POA: Diagnosis not present

## 2021-07-17 DIAGNOSIS — E871 Hypo-osmolality and hyponatremia: Secondary | ICD-10-CM | POA: Diagnosis not present

## 2021-07-17 DIAGNOSIS — A419 Sepsis, unspecified organism: Secondary | ICD-10-CM | POA: Diagnosis not present

## 2021-07-17 DIAGNOSIS — R1310 Dysphagia, unspecified: Secondary | ICD-10-CM | POA: Diagnosis not present

## 2021-07-17 DIAGNOSIS — J449 Chronic obstructive pulmonary disease, unspecified: Secondary | ICD-10-CM | POA: Diagnosis not present

## 2021-07-17 DIAGNOSIS — I5032 Chronic diastolic (congestive) heart failure: Secondary | ICD-10-CM | POA: Diagnosis not present

## 2021-07-17 DIAGNOSIS — I110 Hypertensive heart disease with heart failure: Secondary | ICD-10-CM | POA: Diagnosis not present

## 2021-07-17 DIAGNOSIS — I2510 Atherosclerotic heart disease of native coronary artery without angina pectoris: Secondary | ICD-10-CM | POA: Diagnosis not present

## 2021-07-17 DIAGNOSIS — D509 Iron deficiency anemia, unspecified: Secondary | ICD-10-CM | POA: Diagnosis not present

## 2021-07-17 DIAGNOSIS — J9611 Chronic respiratory failure with hypoxia: Secondary | ICD-10-CM | POA: Diagnosis not present

## 2021-07-17 DIAGNOSIS — Z7901 Long term (current) use of anticoagulants: Secondary | ICD-10-CM | POA: Diagnosis not present

## 2021-07-17 DIAGNOSIS — K5909 Other constipation: Secondary | ICD-10-CM | POA: Diagnosis not present

## 2021-07-17 DIAGNOSIS — R532 Functional quadriplegia: Secondary | ICD-10-CM | POA: Diagnosis not present

## 2021-07-17 DIAGNOSIS — E872 Acidosis: Secondary | ICD-10-CM | POA: Diagnosis not present

## 2021-07-17 DIAGNOSIS — N179 Acute kidney failure, unspecified: Secondary | ICD-10-CM | POA: Diagnosis not present

## 2021-07-20 DIAGNOSIS — R1310 Dysphagia, unspecified: Secondary | ICD-10-CM | POA: Diagnosis not present

## 2021-07-20 DIAGNOSIS — R532 Functional quadriplegia: Secondary | ICD-10-CM | POA: Diagnosis not present

## 2021-07-20 DIAGNOSIS — I5032 Chronic diastolic (congestive) heart failure: Secondary | ICD-10-CM | POA: Diagnosis not present

## 2021-07-20 DIAGNOSIS — E871 Hypo-osmolality and hyponatremia: Secondary | ICD-10-CM | POA: Diagnosis not present

## 2021-07-20 DIAGNOSIS — J449 Chronic obstructive pulmonary disease, unspecified: Secondary | ICD-10-CM | POA: Diagnosis not present

## 2021-07-20 DIAGNOSIS — I2510 Atherosclerotic heart disease of native coronary artery without angina pectoris: Secondary | ICD-10-CM | POA: Diagnosis not present

## 2021-07-20 DIAGNOSIS — A419 Sepsis, unspecified organism: Secondary | ICD-10-CM | POA: Diagnosis not present

## 2021-07-20 DIAGNOSIS — K5909 Other constipation: Secondary | ICD-10-CM | POA: Diagnosis not present

## 2021-07-20 DIAGNOSIS — N179 Acute kidney failure, unspecified: Secondary | ICD-10-CM | POA: Diagnosis not present

## 2021-07-20 DIAGNOSIS — I110 Hypertensive heart disease with heart failure: Secondary | ICD-10-CM | POA: Diagnosis not present

## 2021-07-20 DIAGNOSIS — J9611 Chronic respiratory failure with hypoxia: Secondary | ICD-10-CM | POA: Diagnosis not present

## 2021-07-20 DIAGNOSIS — Z7901 Long term (current) use of anticoagulants: Secondary | ICD-10-CM | POA: Diagnosis not present

## 2021-07-20 DIAGNOSIS — E785 Hyperlipidemia, unspecified: Secondary | ICD-10-CM | POA: Diagnosis not present

## 2021-07-20 DIAGNOSIS — D509 Iron deficiency anemia, unspecified: Secondary | ICD-10-CM | POA: Diagnosis not present

## 2021-07-20 DIAGNOSIS — E872 Acidosis: Secondary | ICD-10-CM | POA: Diagnosis not present

## 2021-07-24 DIAGNOSIS — J9611 Chronic respiratory failure with hypoxia: Secondary | ICD-10-CM | POA: Diagnosis not present

## 2021-07-24 DIAGNOSIS — E785 Hyperlipidemia, unspecified: Secondary | ICD-10-CM | POA: Diagnosis not present

## 2021-07-24 DIAGNOSIS — E872 Acidosis: Secondary | ICD-10-CM | POA: Diagnosis not present

## 2021-07-24 DIAGNOSIS — I2510 Atherosclerotic heart disease of native coronary artery without angina pectoris: Secondary | ICD-10-CM | POA: Diagnosis not present

## 2021-07-24 DIAGNOSIS — D509 Iron deficiency anemia, unspecified: Secondary | ICD-10-CM | POA: Diagnosis not present

## 2021-07-24 DIAGNOSIS — A419 Sepsis, unspecified organism: Secondary | ICD-10-CM | POA: Diagnosis not present

## 2021-07-24 DIAGNOSIS — I5032 Chronic diastolic (congestive) heart failure: Secondary | ICD-10-CM | POA: Diagnosis not present

## 2021-07-24 DIAGNOSIS — E871 Hypo-osmolality and hyponatremia: Secondary | ICD-10-CM | POA: Diagnosis not present

## 2021-07-24 DIAGNOSIS — Z7901 Long term (current) use of anticoagulants: Secondary | ICD-10-CM | POA: Diagnosis not present

## 2021-07-24 DIAGNOSIS — R1310 Dysphagia, unspecified: Secondary | ICD-10-CM | POA: Diagnosis not present

## 2021-07-24 DIAGNOSIS — K5909 Other constipation: Secondary | ICD-10-CM | POA: Diagnosis not present

## 2021-07-24 DIAGNOSIS — J449 Chronic obstructive pulmonary disease, unspecified: Secondary | ICD-10-CM | POA: Diagnosis not present

## 2021-07-24 DIAGNOSIS — N179 Acute kidney failure, unspecified: Secondary | ICD-10-CM | POA: Diagnosis not present

## 2021-07-24 DIAGNOSIS — R532 Functional quadriplegia: Secondary | ICD-10-CM | POA: Diagnosis not present

## 2021-07-24 DIAGNOSIS — I110 Hypertensive heart disease with heart failure: Secondary | ICD-10-CM | POA: Diagnosis not present

## 2021-07-25 DIAGNOSIS — I110 Hypertensive heart disease with heart failure: Secondary | ICD-10-CM | POA: Diagnosis not present

## 2021-07-25 DIAGNOSIS — J449 Chronic obstructive pulmonary disease, unspecified: Secondary | ICD-10-CM | POA: Diagnosis not present

## 2021-07-25 DIAGNOSIS — E871 Hypo-osmolality and hyponatremia: Secondary | ICD-10-CM | POA: Diagnosis not present

## 2021-07-25 DIAGNOSIS — R532 Functional quadriplegia: Secondary | ICD-10-CM | POA: Diagnosis not present

## 2021-07-25 DIAGNOSIS — R1310 Dysphagia, unspecified: Secondary | ICD-10-CM | POA: Diagnosis not present

## 2021-07-25 DIAGNOSIS — Z7901 Long term (current) use of anticoagulants: Secondary | ICD-10-CM | POA: Diagnosis not present

## 2021-07-25 DIAGNOSIS — D509 Iron deficiency anemia, unspecified: Secondary | ICD-10-CM | POA: Diagnosis not present

## 2021-07-25 DIAGNOSIS — E872 Acidosis: Secondary | ICD-10-CM | POA: Diagnosis not present

## 2021-07-25 DIAGNOSIS — A419 Sepsis, unspecified organism: Secondary | ICD-10-CM | POA: Diagnosis not present

## 2021-07-25 DIAGNOSIS — K5909 Other constipation: Secondary | ICD-10-CM | POA: Diagnosis not present

## 2021-07-25 DIAGNOSIS — I2510 Atherosclerotic heart disease of native coronary artery without angina pectoris: Secondary | ICD-10-CM | POA: Diagnosis not present

## 2021-07-25 DIAGNOSIS — E785 Hyperlipidemia, unspecified: Secondary | ICD-10-CM | POA: Diagnosis not present

## 2021-07-25 DIAGNOSIS — N179 Acute kidney failure, unspecified: Secondary | ICD-10-CM | POA: Diagnosis not present

## 2021-07-25 DIAGNOSIS — J9611 Chronic respiratory failure with hypoxia: Secondary | ICD-10-CM | POA: Diagnosis not present

## 2021-07-25 DIAGNOSIS — I5032 Chronic diastolic (congestive) heart failure: Secondary | ICD-10-CM | POA: Diagnosis not present

## 2021-07-26 ENCOUNTER — Other Ambulatory Visit: Payer: Self-pay | Admitting: Internal Medicine

## 2021-07-26 DIAGNOSIS — I10 Essential (primary) hypertension: Secondary | ICD-10-CM

## 2021-07-27 ENCOUNTER — Ambulatory Visit: Payer: Medicare Other | Admitting: Infectious Diseases

## 2021-07-27 DIAGNOSIS — A419 Sepsis, unspecified organism: Secondary | ICD-10-CM | POA: Diagnosis not present

## 2021-07-27 DIAGNOSIS — E785 Hyperlipidemia, unspecified: Secondary | ICD-10-CM | POA: Diagnosis not present

## 2021-07-27 DIAGNOSIS — I5032 Chronic diastolic (congestive) heart failure: Secondary | ICD-10-CM | POA: Diagnosis not present

## 2021-07-27 DIAGNOSIS — I2510 Atherosclerotic heart disease of native coronary artery without angina pectoris: Secondary | ICD-10-CM | POA: Diagnosis not present

## 2021-07-27 DIAGNOSIS — I110 Hypertensive heart disease with heart failure: Secondary | ICD-10-CM | POA: Diagnosis not present

## 2021-07-27 DIAGNOSIS — N179 Acute kidney failure, unspecified: Secondary | ICD-10-CM | POA: Diagnosis not present

## 2021-07-27 DIAGNOSIS — D509 Iron deficiency anemia, unspecified: Secondary | ICD-10-CM | POA: Diagnosis not present

## 2021-07-27 DIAGNOSIS — K5909 Other constipation: Secondary | ICD-10-CM | POA: Diagnosis not present

## 2021-07-27 DIAGNOSIS — J9611 Chronic respiratory failure with hypoxia: Secondary | ICD-10-CM | POA: Diagnosis not present

## 2021-07-27 DIAGNOSIS — E871 Hypo-osmolality and hyponatremia: Secondary | ICD-10-CM | POA: Diagnosis not present

## 2021-07-27 DIAGNOSIS — E872 Acidosis: Secondary | ICD-10-CM | POA: Diagnosis not present

## 2021-07-27 DIAGNOSIS — R1310 Dysphagia, unspecified: Secondary | ICD-10-CM | POA: Diagnosis not present

## 2021-07-27 DIAGNOSIS — J449 Chronic obstructive pulmonary disease, unspecified: Secondary | ICD-10-CM | POA: Diagnosis not present

## 2021-07-27 DIAGNOSIS — Z7901 Long term (current) use of anticoagulants: Secondary | ICD-10-CM | POA: Diagnosis not present

## 2021-07-27 DIAGNOSIS — R532 Functional quadriplegia: Secondary | ICD-10-CM | POA: Diagnosis not present

## 2021-07-31 DIAGNOSIS — E785 Hyperlipidemia, unspecified: Secondary | ICD-10-CM | POA: Diagnosis not present

## 2021-07-31 DIAGNOSIS — E872 Acidosis: Secondary | ICD-10-CM | POA: Diagnosis not present

## 2021-07-31 DIAGNOSIS — K219 Gastro-esophageal reflux disease without esophagitis: Secondary | ICD-10-CM | POA: Diagnosis not present

## 2021-07-31 DIAGNOSIS — I700 Atherosclerosis of aorta: Secondary | ICD-10-CM | POA: Diagnosis not present

## 2021-07-31 DIAGNOSIS — I2510 Atherosclerotic heart disease of native coronary artery without angina pectoris: Secondary | ICD-10-CM | POA: Diagnosis not present

## 2021-07-31 DIAGNOSIS — J9611 Chronic respiratory failure with hypoxia: Secondary | ICD-10-CM | POA: Diagnosis not present

## 2021-07-31 DIAGNOSIS — K5909 Other constipation: Secondary | ICD-10-CM | POA: Diagnosis not present

## 2021-07-31 DIAGNOSIS — Z86711 Personal history of pulmonary embolism: Secondary | ICD-10-CM | POA: Diagnosis not present

## 2021-07-31 DIAGNOSIS — N1831 Chronic kidney disease, stage 3a: Secondary | ICD-10-CM | POA: Diagnosis not present

## 2021-07-31 DIAGNOSIS — D509 Iron deficiency anemia, unspecified: Secondary | ICD-10-CM | POA: Diagnosis not present

## 2021-07-31 DIAGNOSIS — Z7901 Long term (current) use of anticoagulants: Secondary | ICD-10-CM | POA: Diagnosis not present

## 2021-07-31 DIAGNOSIS — Z9981 Dependence on supplemental oxygen: Secondary | ICD-10-CM | POA: Diagnosis not present

## 2021-07-31 DIAGNOSIS — F17210 Nicotine dependence, cigarettes, uncomplicated: Secondary | ICD-10-CM | POA: Diagnosis not present

## 2021-07-31 DIAGNOSIS — R532 Functional quadriplegia: Secondary | ICD-10-CM | POA: Diagnosis not present

## 2021-07-31 DIAGNOSIS — I5032 Chronic diastolic (congestive) heart failure: Secondary | ICD-10-CM | POA: Diagnosis not present

## 2021-07-31 DIAGNOSIS — J4520 Mild intermittent asthma, uncomplicated: Secondary | ICD-10-CM | POA: Diagnosis not present

## 2021-07-31 DIAGNOSIS — R1310 Dysphagia, unspecified: Secondary | ICD-10-CM | POA: Diagnosis not present

## 2021-07-31 DIAGNOSIS — J449 Chronic obstructive pulmonary disease, unspecified: Secondary | ICD-10-CM | POA: Diagnosis not present

## 2021-07-31 DIAGNOSIS — Z4800 Encounter for change or removal of nonsurgical wound dressing: Secondary | ICD-10-CM | POA: Diagnosis not present

## 2021-07-31 DIAGNOSIS — I130 Hypertensive heart and chronic kidney disease with heart failure and stage 1 through stage 4 chronic kidney disease, or unspecified chronic kidney disease: Secondary | ICD-10-CM | POA: Diagnosis not present

## 2021-07-31 DIAGNOSIS — E871 Hypo-osmolality and hyponatremia: Secondary | ICD-10-CM | POA: Diagnosis not present

## 2021-08-01 DIAGNOSIS — I130 Hypertensive heart and chronic kidney disease with heart failure and stage 1 through stage 4 chronic kidney disease, or unspecified chronic kidney disease: Secondary | ICD-10-CM | POA: Diagnosis not present

## 2021-08-01 DIAGNOSIS — E871 Hypo-osmolality and hyponatremia: Secondary | ICD-10-CM | POA: Diagnosis not present

## 2021-08-01 DIAGNOSIS — E872 Acidosis: Secondary | ICD-10-CM | POA: Diagnosis not present

## 2021-08-01 DIAGNOSIS — I2510 Atherosclerotic heart disease of native coronary artery without angina pectoris: Secondary | ICD-10-CM | POA: Diagnosis not present

## 2021-08-01 DIAGNOSIS — I5032 Chronic diastolic (congestive) heart failure: Secondary | ICD-10-CM | POA: Diagnosis not present

## 2021-08-01 DIAGNOSIS — Z7901 Long term (current) use of anticoagulants: Secondary | ICD-10-CM | POA: Diagnosis not present

## 2021-08-01 DIAGNOSIS — F17210 Nicotine dependence, cigarettes, uncomplicated: Secondary | ICD-10-CM | POA: Diagnosis not present

## 2021-08-01 DIAGNOSIS — K219 Gastro-esophageal reflux disease without esophagitis: Secondary | ICD-10-CM | POA: Diagnosis not present

## 2021-08-01 DIAGNOSIS — R532 Functional quadriplegia: Secondary | ICD-10-CM | POA: Diagnosis not present

## 2021-08-01 DIAGNOSIS — N1831 Chronic kidney disease, stage 3a: Secondary | ICD-10-CM | POA: Diagnosis not present

## 2021-08-01 DIAGNOSIS — E785 Hyperlipidemia, unspecified: Secondary | ICD-10-CM | POA: Diagnosis not present

## 2021-08-01 DIAGNOSIS — Z4800 Encounter for change or removal of nonsurgical wound dressing: Secondary | ICD-10-CM | POA: Diagnosis not present

## 2021-08-01 DIAGNOSIS — Z9981 Dependence on supplemental oxygen: Secondary | ICD-10-CM | POA: Diagnosis not present

## 2021-08-01 DIAGNOSIS — I700 Atherosclerosis of aorta: Secondary | ICD-10-CM | POA: Diagnosis not present

## 2021-08-01 DIAGNOSIS — J9611 Chronic respiratory failure with hypoxia: Secondary | ICD-10-CM | POA: Diagnosis not present

## 2021-08-01 DIAGNOSIS — D509 Iron deficiency anemia, unspecified: Secondary | ICD-10-CM | POA: Diagnosis not present

## 2021-08-01 DIAGNOSIS — J449 Chronic obstructive pulmonary disease, unspecified: Secondary | ICD-10-CM | POA: Diagnosis not present

## 2021-08-01 DIAGNOSIS — Z86711 Personal history of pulmonary embolism: Secondary | ICD-10-CM | POA: Diagnosis not present

## 2021-08-01 DIAGNOSIS — R1310 Dysphagia, unspecified: Secondary | ICD-10-CM | POA: Diagnosis not present

## 2021-08-01 DIAGNOSIS — K5909 Other constipation: Secondary | ICD-10-CM | POA: Diagnosis not present

## 2021-08-01 DIAGNOSIS — J4520 Mild intermittent asthma, uncomplicated: Secondary | ICD-10-CM | POA: Diagnosis not present

## 2021-08-02 ENCOUNTER — Other Ambulatory Visit: Payer: Self-pay | Admitting: *Deleted

## 2021-08-02 NOTE — Patient Outreach (Signed)
[facility]) Care Management  08/02/2021  Thomas Tanner [birth year] [id number]   Outgoing call placed to member, state he is doing "very good."  Denies any urgent concerns, encouraged to contact this care manager with questions.  Agrees to follow up within the next month.   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   Taking really good care of your skin will help to keep your skin unbroken.    Notes:   7/6 - Nurse from [facility] will be making home visits for wound care.  Son currently doing dressing changes every other day.  Confirms he is taking antibiotics for infection control  7/22 - Confirms that HH has started and wound is improving.  Has appointment with plastic surgeon on 7/28  8/24 - Report wound is just about closed.  Still have HH nursing twice a week for dressing changes.  Hoping plastic surgeon can proceed with closure plan during next visit.     [facility] CM: Make and Keep All Appointments   On track    Timeframe:  Short-Term Goal Priority:  Medium Start Date:       7/6                      Expected End Date:        10/6 (goal extended)           Barriers: Transportation    - ask family or friend for a ride - keep a calendar with appointment dates    Why is this important?   Part of staying healthy is seeing the doctor for follow-up care.  If you forget your appointments, there are some things you can do to stay on track.    Notes:   10/05/20:  -- Discussed with patient to follow up with care providers (CHF clinic/ cardiologist) to make next appointments --  reviewed with patient recent office visits with pulmonology and PCP  11/07/20: -- encouraged patient to make prompt appointment with CHF clinic to obtain bariatric scales, as discussed during telephone call 10/05/20: confirmed patient declines need for care coordination in scheduling appointment -- confirmed ongoing reliable transportation through family members -- reviewed upcoming scheduled provider appointments   GOALS UNMET-CASE CLOSURE   7/6 - Goal reactivated as case reopened - Reviewed upcoming appointments.  Discussed concern regarding inability to get to provider offices due to decreased strength.  Encouraged to make all appointments virtual until strength has been regained.  Has virtual visits with urology on 7/11 and plastic surgeon on 7/13.  Will call PCP after urology appointment to schedule  7/22 - Canceled virtual appointment with urology.  Advised of importance due to wound and foley catheter in place.  Advised of the need to activate  My Chart in effort to upload pictures and complete virtual visit.  8/24 - Report urology appointment was completed, foley catheter removed.  Has plastic surgeon appointment on 9/14 as well as vascular appointment on 9/13.  He will have CCM visit with [facility] pharmacist on 9/7, will discuss follow up appointment with PCP at that time       [name], RN, MSN [facility] Care Management  [facility] Manager [phone]

## 2021-08-03 ENCOUNTER — Institutional Professional Consult (permissible substitution): Payer: Medicare Other | Admitting: Plastic Surgery

## 2021-08-03 DIAGNOSIS — K219 Gastro-esophageal reflux disease without esophagitis: Secondary | ICD-10-CM | POA: Diagnosis not present

## 2021-08-03 DIAGNOSIS — K5909 Other constipation: Secondary | ICD-10-CM | POA: Diagnosis not present

## 2021-08-03 DIAGNOSIS — R532 Functional quadriplegia: Secondary | ICD-10-CM | POA: Diagnosis not present

## 2021-08-03 DIAGNOSIS — F17210 Nicotine dependence, cigarettes, uncomplicated: Secondary | ICD-10-CM | POA: Diagnosis not present

## 2021-08-03 DIAGNOSIS — I5032 Chronic diastolic (congestive) heart failure: Secondary | ICD-10-CM | POA: Diagnosis not present

## 2021-08-03 DIAGNOSIS — J4520 Mild intermittent asthma, uncomplicated: Secondary | ICD-10-CM | POA: Diagnosis not present

## 2021-08-03 DIAGNOSIS — R1310 Dysphagia, unspecified: Secondary | ICD-10-CM | POA: Diagnosis not present

## 2021-08-03 DIAGNOSIS — Z4800 Encounter for change or removal of nonsurgical wound dressing: Secondary | ICD-10-CM | POA: Diagnosis not present

## 2021-08-03 DIAGNOSIS — Z9981 Dependence on supplemental oxygen: Secondary | ICD-10-CM | POA: Diagnosis not present

## 2021-08-03 DIAGNOSIS — E872 Acidosis: Secondary | ICD-10-CM | POA: Diagnosis not present

## 2021-08-03 DIAGNOSIS — Z86711 Personal history of pulmonary embolism: Secondary | ICD-10-CM | POA: Diagnosis not present

## 2021-08-03 DIAGNOSIS — I130 Hypertensive heart and chronic kidney disease with heart failure and stage 1 through stage 4 chronic kidney disease, or unspecified chronic kidney disease: Secondary | ICD-10-CM | POA: Diagnosis not present

## 2021-08-03 DIAGNOSIS — E871 Hypo-osmolality and hyponatremia: Secondary | ICD-10-CM | POA: Diagnosis not present

## 2021-08-03 DIAGNOSIS — J9611 Chronic respiratory failure with hypoxia: Secondary | ICD-10-CM | POA: Diagnosis not present

## 2021-08-03 DIAGNOSIS — E785 Hyperlipidemia, unspecified: Secondary | ICD-10-CM | POA: Diagnosis not present

## 2021-08-03 DIAGNOSIS — J449 Chronic obstructive pulmonary disease, unspecified: Secondary | ICD-10-CM | POA: Diagnosis not present

## 2021-08-03 DIAGNOSIS — D509 Iron deficiency anemia, unspecified: Secondary | ICD-10-CM | POA: Diagnosis not present

## 2021-08-03 DIAGNOSIS — N1831 Chronic kidney disease, stage 3a: Secondary | ICD-10-CM | POA: Diagnosis not present

## 2021-08-03 DIAGNOSIS — I700 Atherosclerosis of aorta: Secondary | ICD-10-CM | POA: Diagnosis not present

## 2021-08-03 DIAGNOSIS — I2510 Atherosclerotic heart disease of native coronary artery without angina pectoris: Secondary | ICD-10-CM | POA: Diagnosis not present

## 2021-08-03 DIAGNOSIS — Z7901 Long term (current) use of anticoagulants: Secondary | ICD-10-CM | POA: Diagnosis not present

## 2021-08-07 DIAGNOSIS — I2510 Atherosclerotic heart disease of native coronary artery without angina pectoris: Secondary | ICD-10-CM | POA: Diagnosis not present

## 2021-08-07 DIAGNOSIS — Z7901 Long term (current) use of anticoagulants: Secondary | ICD-10-CM | POA: Diagnosis not present

## 2021-08-07 DIAGNOSIS — I130 Hypertensive heart and chronic kidney disease with heart failure and stage 1 through stage 4 chronic kidney disease, or unspecified chronic kidney disease: Secondary | ICD-10-CM | POA: Diagnosis not present

## 2021-08-07 DIAGNOSIS — Z4800 Encounter for change or removal of nonsurgical wound dressing: Secondary | ICD-10-CM | POA: Diagnosis not present

## 2021-08-07 DIAGNOSIS — Z86711 Personal history of pulmonary embolism: Secondary | ICD-10-CM | POA: Diagnosis not present

## 2021-08-07 DIAGNOSIS — R532 Functional quadriplegia: Secondary | ICD-10-CM | POA: Diagnosis not present

## 2021-08-07 DIAGNOSIS — K5909 Other constipation: Secondary | ICD-10-CM | POA: Diagnosis not present

## 2021-08-07 DIAGNOSIS — I5032 Chronic diastolic (congestive) heart failure: Secondary | ICD-10-CM | POA: Diagnosis not present

## 2021-08-07 DIAGNOSIS — D509 Iron deficiency anemia, unspecified: Secondary | ICD-10-CM | POA: Diagnosis not present

## 2021-08-07 DIAGNOSIS — J4520 Mild intermittent asthma, uncomplicated: Secondary | ICD-10-CM | POA: Diagnosis not present

## 2021-08-07 DIAGNOSIS — F17210 Nicotine dependence, cigarettes, uncomplicated: Secondary | ICD-10-CM | POA: Diagnosis not present

## 2021-08-07 DIAGNOSIS — J9611 Chronic respiratory failure with hypoxia: Secondary | ICD-10-CM | POA: Diagnosis not present

## 2021-08-07 DIAGNOSIS — E785 Hyperlipidemia, unspecified: Secondary | ICD-10-CM | POA: Diagnosis not present

## 2021-08-07 DIAGNOSIS — K219 Gastro-esophageal reflux disease without esophagitis: Secondary | ICD-10-CM | POA: Diagnosis not present

## 2021-08-07 DIAGNOSIS — N1831 Chronic kidney disease, stage 3a: Secondary | ICD-10-CM | POA: Diagnosis not present

## 2021-08-07 DIAGNOSIS — J449 Chronic obstructive pulmonary disease, unspecified: Secondary | ICD-10-CM | POA: Diagnosis not present

## 2021-08-07 DIAGNOSIS — E871 Hypo-osmolality and hyponatremia: Secondary | ICD-10-CM | POA: Diagnosis not present

## 2021-08-07 DIAGNOSIS — I700 Atherosclerosis of aorta: Secondary | ICD-10-CM | POA: Diagnosis not present

## 2021-08-07 DIAGNOSIS — E872 Acidosis: Secondary | ICD-10-CM | POA: Diagnosis not present

## 2021-08-07 DIAGNOSIS — R1310 Dysphagia, unspecified: Secondary | ICD-10-CM | POA: Diagnosis not present

## 2021-08-07 DIAGNOSIS — Z9981 Dependence on supplemental oxygen: Secondary | ICD-10-CM | POA: Diagnosis not present

## 2021-08-08 DIAGNOSIS — J9601 Acute respiratory failure with hypoxia: Secondary | ICD-10-CM | POA: Diagnosis not present

## 2021-08-08 DIAGNOSIS — J449 Chronic obstructive pulmonary disease, unspecified: Secondary | ICD-10-CM | POA: Diagnosis not present

## 2021-08-10 DIAGNOSIS — I2510 Atherosclerotic heart disease of native coronary artery without angina pectoris: Secondary | ICD-10-CM | POA: Diagnosis not present

## 2021-08-10 DIAGNOSIS — E785 Hyperlipidemia, unspecified: Secondary | ICD-10-CM | POA: Diagnosis not present

## 2021-08-10 DIAGNOSIS — D509 Iron deficiency anemia, unspecified: Secondary | ICD-10-CM | POA: Diagnosis not present

## 2021-08-10 DIAGNOSIS — E872 Acidosis: Secondary | ICD-10-CM | POA: Diagnosis not present

## 2021-08-10 DIAGNOSIS — R1310 Dysphagia, unspecified: Secondary | ICD-10-CM | POA: Diagnosis not present

## 2021-08-10 DIAGNOSIS — J449 Chronic obstructive pulmonary disease, unspecified: Secondary | ICD-10-CM | POA: Diagnosis not present

## 2021-08-10 DIAGNOSIS — I130 Hypertensive heart and chronic kidney disease with heart failure and stage 1 through stage 4 chronic kidney disease, or unspecified chronic kidney disease: Secondary | ICD-10-CM | POA: Diagnosis not present

## 2021-08-10 DIAGNOSIS — Z4800 Encounter for change or removal of nonsurgical wound dressing: Secondary | ICD-10-CM | POA: Diagnosis not present

## 2021-08-10 DIAGNOSIS — Z9981 Dependence on supplemental oxygen: Secondary | ICD-10-CM | POA: Diagnosis not present

## 2021-08-10 DIAGNOSIS — J9611 Chronic respiratory failure with hypoxia: Secondary | ICD-10-CM | POA: Diagnosis not present

## 2021-08-10 DIAGNOSIS — I5032 Chronic diastolic (congestive) heart failure: Secondary | ICD-10-CM | POA: Diagnosis not present

## 2021-08-10 DIAGNOSIS — N1831 Chronic kidney disease, stage 3a: Secondary | ICD-10-CM | POA: Diagnosis not present

## 2021-08-10 DIAGNOSIS — R532 Functional quadriplegia: Secondary | ICD-10-CM | POA: Diagnosis not present

## 2021-08-10 DIAGNOSIS — Z7901 Long term (current) use of anticoagulants: Secondary | ICD-10-CM | POA: Diagnosis not present

## 2021-08-10 DIAGNOSIS — K5909 Other constipation: Secondary | ICD-10-CM | POA: Diagnosis not present

## 2021-08-10 DIAGNOSIS — Z86711 Personal history of pulmonary embolism: Secondary | ICD-10-CM | POA: Diagnosis not present

## 2021-08-10 DIAGNOSIS — I700 Atherosclerosis of aorta: Secondary | ICD-10-CM | POA: Diagnosis not present

## 2021-08-10 DIAGNOSIS — K219 Gastro-esophageal reflux disease without esophagitis: Secondary | ICD-10-CM | POA: Diagnosis not present

## 2021-08-10 DIAGNOSIS — E871 Hypo-osmolality and hyponatremia: Secondary | ICD-10-CM | POA: Diagnosis not present

## 2021-08-10 DIAGNOSIS — J4520 Mild intermittent asthma, uncomplicated: Secondary | ICD-10-CM | POA: Diagnosis not present

## 2021-08-10 DIAGNOSIS — F17210 Nicotine dependence, cigarettes, uncomplicated: Secondary | ICD-10-CM | POA: Diagnosis not present

## 2021-08-14 DIAGNOSIS — E785 Hyperlipidemia, unspecified: Secondary | ICD-10-CM | POA: Diagnosis not present

## 2021-08-14 DIAGNOSIS — R1310 Dysphagia, unspecified: Secondary | ICD-10-CM | POA: Diagnosis not present

## 2021-08-14 DIAGNOSIS — Z7901 Long term (current) use of anticoagulants: Secondary | ICD-10-CM | POA: Diagnosis not present

## 2021-08-14 DIAGNOSIS — Z4800 Encounter for change or removal of nonsurgical wound dressing: Secondary | ICD-10-CM | POA: Diagnosis not present

## 2021-08-14 DIAGNOSIS — Z86711 Personal history of pulmonary embolism: Secondary | ICD-10-CM | POA: Diagnosis not present

## 2021-08-14 DIAGNOSIS — J449 Chronic obstructive pulmonary disease, unspecified: Secondary | ICD-10-CM | POA: Diagnosis not present

## 2021-08-14 DIAGNOSIS — R532 Functional quadriplegia: Secondary | ICD-10-CM | POA: Diagnosis not present

## 2021-08-14 DIAGNOSIS — K219 Gastro-esophageal reflux disease without esophagitis: Secondary | ICD-10-CM | POA: Diagnosis not present

## 2021-08-14 DIAGNOSIS — J9611 Chronic respiratory failure with hypoxia: Secondary | ICD-10-CM | POA: Diagnosis not present

## 2021-08-14 DIAGNOSIS — J4520 Mild intermittent asthma, uncomplicated: Secondary | ICD-10-CM | POA: Diagnosis not present

## 2021-08-14 DIAGNOSIS — Z9981 Dependence on supplemental oxygen: Secondary | ICD-10-CM | POA: Diagnosis not present

## 2021-08-14 DIAGNOSIS — E872 Acidosis: Secondary | ICD-10-CM | POA: Diagnosis not present

## 2021-08-14 DIAGNOSIS — F17210 Nicotine dependence, cigarettes, uncomplicated: Secondary | ICD-10-CM | POA: Diagnosis not present

## 2021-08-14 DIAGNOSIS — I700 Atherosclerosis of aorta: Secondary | ICD-10-CM | POA: Diagnosis not present

## 2021-08-14 DIAGNOSIS — I130 Hypertensive heart and chronic kidney disease with heart failure and stage 1 through stage 4 chronic kidney disease, or unspecified chronic kidney disease: Secondary | ICD-10-CM | POA: Diagnosis not present

## 2021-08-14 DIAGNOSIS — I5032 Chronic diastolic (congestive) heart failure: Secondary | ICD-10-CM | POA: Diagnosis not present

## 2021-08-14 DIAGNOSIS — N1831 Chronic kidney disease, stage 3a: Secondary | ICD-10-CM | POA: Diagnosis not present

## 2021-08-14 DIAGNOSIS — K5909 Other constipation: Secondary | ICD-10-CM | POA: Diagnosis not present

## 2021-08-14 DIAGNOSIS — D509 Iron deficiency anemia, unspecified: Secondary | ICD-10-CM | POA: Diagnosis not present

## 2021-08-14 DIAGNOSIS — E871 Hypo-osmolality and hyponatremia: Secondary | ICD-10-CM | POA: Diagnosis not present

## 2021-08-14 DIAGNOSIS — I2510 Atherosclerotic heart disease of native coronary artery without angina pectoris: Secondary | ICD-10-CM | POA: Diagnosis not present

## 2021-08-15 ENCOUNTER — Telehealth: Payer: Self-pay | Admitting: Pharmacist

## 2021-08-15 NOTE — Progress Notes (Addendum)
    Chronic Care Management Pharmacy Assistant   Name: Keelan Tripodi  MRN: [id number] DOB: 1951-04-28  Thomas Tanner is an 70 y.o. year old male who presents for his initial CCM visit with the clinical pharmacist.  Reason for Encounter: Chart Prep    Conditions to be addressed/monitored: HTN, CAD, CHF, COPD.  Primary concerns for visit include: HTN.  Recent office visits:  05/30/21 Dr. Welton Flakes For hospital follow-up. No medication changes  Recent consult visits:  06/06/21 Infectious Disease Lynn Ito, MD. No medication changes. 05/21/21 Surgery Urology IRRIGATION AND DEBRIDEMENT ABSCESS 05/20/21 Surgery Urology IRRIGATION AND DEBRIDEMENT ABSCESS  Hospital visits:  06/10/21 [facility] (2 days) For Scrotal bleeding. STOPPED Polyethylene Glycol.  05/18/21 [facility] (9 days) STOPPED Furosemide, Lisinopril, Nicotine, and Omega.Per note: Surgery was preformed-INCISION AND DRAINAGE SCROTAL ABSCESS, cystoscopy with urethral dilation, and complex catheter placement.  05/05/21 [facility] (11 days) For Constipation. STOPPED Tramadol.   Medication History: Rosuvastatin 06/09/21 90 DS.   Medications: Outpatient Encounter Medications as of 08/15/2021  Medication Sig   albuterol (VENTOLIN HFA) 108 (90 Base) MCG/ACT inhaler Inhale 2 puffs into the lungs every 6 (six) hours as needed for wheezing or shortness of breath.   apixaban (ELIQUIS) 5 MG TABS tablet Take 1 tablet (5 mg total) by mouth 2 (two) times daily.   famotidine (PEPCID) 20 MG tablet Take 1 tablet (20 mg total) by mouth 2 (two) times daily.   iron polysaccharides (NIFEREX) 150 MG capsule Take 1 capsule (150 mg total) by mouth daily.   metoprolol succinate (TOPROL-XL) 50 MG 24 hr tablet Take 1 tablet by mouth once daily   phenylephrine-shark liver oil-mineral oil-petrolatum (PREPARATION H) 0.25-14-74.9 % rectal ointment Place 1 application  rectally 2 (two) times daily as needed for hemorrhoids.   rosuvastatin (CRESTOR) 20 MG tablet Take 1 tablet (20 mg total) by mouth daily.   senna-docusate (SENOKOT-S) 8.6-50 MG tablet Take 1 tablet by mouth 2 (two) times daily.   vitamin B-12 1000 MCG tablet Take 1 tablet (1,000 mcg total) by mouth daily.   No facility-administered encounter medications on file as of 08/15/2021.   Have you seen any other providers since your last visit? Patient stated no.  Any changes in your medications or health? Patient stated no.   Any side effects from any medications? Patient stated no.  Do you have an symptoms or problems not managed by your medications? Patient stated no.   Any concerns about your health right now? Patient stated no.   Has your provider asked that you check blood pressure, blood sugar, or follow special diet at home? Patient stated he checks his blood pressure multiple times a week, he stated his blood sugar machine is broken.   Do you get any type of exercise on a regular basis? Patient stated he is doing physical therapy.  Can you think of a goal you would like to reach for your health? Patient stated he would like start working better.   Do you have any problems getting your medications? Patient stated no.  Is there anything that you would like to discuss during the appointment? Patient stated no.  Please bring medications and supplements to appointment, patient reminded of his OTP appointment on 08/16/21 at 1030 pm.   Follow-Up:Pharmacist Review  Hulen Luster, RMA Clinical Pharmacist Assistant [phone]

## 2021-08-16 ENCOUNTER — Ambulatory Visit: Payer: Medicare Other

## 2021-08-16 ENCOUNTER — Other Ambulatory Visit: Payer: Self-pay

## 2021-08-16 NOTE — Progress Notes (Deleted)
Chronic Care Management Pharmacy Note  08/16/2021 Name:  Thomas Tanner MRN:  [id number] DOB:  Jul 08, 1951  Summary: ***  Recommendations/Changes made from today's visit: ***  Plan: ***   Subjective: Thomas Tanner is an 70 y.o. year old male who is a primary patient of Thomas Rolls Timoteo Gaul, MD.  The CCM team was consulted for assistance with disease management and care coordination needs.    Engaged with patient by telephone for initial visit in response to provider referral for pharmacy case management and/or care coordination services.   Consent to Services:  The patient was given the following information about Chronic Care Management services today, agreed to services, and gave verbal consent: 1. CCM service includes personalized support from designated clinical staff supervised by the primary care provider, including individualized plan of care and coordination with other care providers 2. 24/7 contact phone numbers for assistance for urgent and routine care needs. 3. Service will only be billed when office clinical staff spend 20 minutes or more in a month to coordinate care. 4. Only one practitioner may furnish and bill the service in a calendar month. 5.The patient may stop CCM services at any time (effective at the end of the month) by phone call to the office staff. 6. The patient will be responsible for cost sharing (co-pay) of up to 20% of the service fee (after annual deductible is met). Patient agreed to services and consent obtained.  Patient Care Team: [name], MD as PCP - General (Internal Medicine) [name], RN as [name], [facility] as Pharmacist (Pharmacist)  Recent office visits:  05/30/21 Dr. [name] For hospital follow-up. No medication changes   Recent consult visits:  06/06/21 Infectious Disease Tsosie Billing, MD. No medication changes. 05/21/21 Surgery Urology IRRIGATION AND DEBRIDEMENT  ABSCESS 05/20/21 Surgery Urology IRRIGATION AND [facility] visits:  06/10/21 [facility] (2 days) For Scrotal bleeding. STOPPED Polyethylene Glycol.  05/18/21 [facility] (9 days) STOPPED Furosemide, Lisinopril, Nicotine, and Omega.Per note: Surgery was preformed-INCISION AND DRAINAGE SCROTAL ABSCESS, cystoscopy with urethral dilation, and complex catheter placement.   05/05/21 [facility] (11 days) For Constipation. STOPPED Tramadol.    Medication History: Rosuvastatin 06/09/21 90 DS.    Objective:  Lab Results  Component Value Date   CREATININE 1.01 06/11/2021   BUN 9 06/10/2021   GFRNONAA >60 06/11/2021   GFRAA >60 08/07/2020   NA 134 (L) 06/10/2021   K 3.8 06/10/2021   CALCIUM 8.4 (L) 06/10/2021   CO2 27 06/10/2021   GLUCOSE 97 06/10/2021    Lab Results  Component Value Date/Time   HGBA1C 6.4 (H) 05/19/2021 05:33 AM   HGBA1C 6.4 (H) 05/27/2020 05:46 AM   HGBA1C 6.7 (H) 06/11/2013 04:04 AM    Last diabetic Eye exam: No results found for: HMDIABEYEEXA  Last diabetic Foot exam: No results found for: HMDIABFOOTEX   Lab Results  Component Value Date   CHOL 163 12/08/2018   HDL 34 (L) 12/08/2018   LDLCALC 105 (H) 12/08/2018   TRIG 121 12/08/2018   CHOLHDL 4.8 12/08/2018    Hepatic Function Latest Ref Rng & Units 06/09/2021 05/19/2021 05/18/2021  Total Protein 6.5 - 8.1 g/dL 6.5 5.9(L) 6.7  Albumin 3.5 - 5.0 g/dL 2.6(L) 2.4(L) 2.6(L)  AST 15 - 41 U/L _0 ALT 0 - 44 U/L _1 Alk Phosphatase 38 - 126 U/L 92 68 79  Total Bilirubin 0.3 - 1.2 mg/dL 0.5 1.7(H) 1.6(H)  Bilirubin, Direct 0.0 - 0.2 mg/dL - - -    Lab Results  Component Value Date/Time   TSH 1.946 05/19/2021 02:26 AM   TSH 0.708 12/08/2018 06:57 AM    CBC Latest Ref Rng & Units 06/11/2021 06/10/2021 06/09/2021  WBC 4.0 - 10.5 K/uL 9.2 10.6(H) 10.8(H)  Hemoglobin 13.0 - 17.0 g/dL 10.0(L) 10.2(L) 10.7(L)  Hematocrit 39.0 -  52.0 % 27.7(L) 32.4(L) 28.5(L)  Platelets 150 - 400 K/uL 357 368 406(H)    No results found for: VD25OH  Clinical ASCVD: {YES/NO:21197} The ASCVD Risk score Mikey Bussing DC Jr., et al., 2013) failed to calculate for the following reasons:   The patient has a prior MI or stroke diagnosis    Depression screen [facility] 2/9 06/14/2021 05/30/2021 11/07/2020  Decreased Interest 0 3 0  Down, Depressed, Hopeless 1 3 0  PHQ - 2 Score 1 6 0  Altered sleeping - 1 -  Tired, decreased energy - 3 -  Change in appetite - 3 -  Feeling bad or failure about yourself  - 0 -  Trouble concentrating - 0 -  Moving slowly or fidgety/restless - 0 -  Suicidal thoughts - 0 -  PHQ-9 Score - 13 -     ***Other: (CHADS2VASc if Afib, MMRC or CAT for COPD, ACT, DEXA)  Social History   Tobacco Use  Smoking Status Every Day   Packs/day: 0.50   Types: Cigarettes   Last attempt to quit: 07/13/2020   Years since quitting: 1.0  Smokeless Tobacco Never   BP Readings from Last 3 Encounters:  06/12/21 118/67  05/27/21 126/74  05/16/21 (!) 109/59   Pulse Readings from Last 3 Encounters:  06/12/21 82  05/27/21 89  05/16/21 81   Wt Readings from Last 3 Encounters:  06/09/21 (!) 401 lb 3.8 oz (182 kg)  05/30/21 (!) 400 lb (181.4 kg)  05/18/21 (!) 421 lb (191 kg)   BMI Readings from Last 3 Encounters:  06/09/21 54.42 kg/m  05/30/21 52.77 kg/m  05/18/21 55.54 kg/m    Assessment/Interventions: Review of patient past medical history, allergies, medications, health status, including review of consultants reports, laboratory and other test data, was performed as part of comprehensive evaluation and provision of chronic care management services.   SDOH:  (Social Determinants of Health) assessments and interventions performed: {yes/no:20286}  SDOH Screenings   Alcohol Screen: Low Risk    Last Alcohol Screening Score (AUDIT): 0  Depression (PHQ2-9): Low Risk    PHQ-2 Score: 1  Financial Resource Strain: Not on file   Food Insecurity: No Food Insecurity   Worried About Charity fundraiser in the Last Year: Never true   Ran Out of Food in the Last Year: Never true  Housing: Low Risk    Last Housing Risk Score: 0  Physical Activity: Not on file  Social Connections: Not on file  Stress: Not on file  Tobacco Use: High Risk   Smoking Tobacco Use: Every Day   Smokeless Tobacco Use: Never  Transportation Needs: Unmet Transportation Needs   Lack of Transportation (Medical): Yes   Lack of Transportation (Non-Medical): Yes    CCM Care Plan  Allergies  Allergen Reactions   Morphine Anxiety and Other (See Comments)    Hallucinations  Pt states 6.17.22 that he is not allergic to this medication   Morphine And Related Anxiety    Medications Reviewed Today     Reviewed by [name], RN (  Registered Nurse) on 06/14/21 at 1544  Med List Status: <None>   Medication Order Taking? Sig Documenting Provider Last Dose Status Informant  acidophilus (RISAQUAD) CAPS capsule [id number]  Take 1 capsule by mouth 3 (three) times daily for 14 days. Nicole Kindred A, DO  Active   amoxicillin-clavulanate (AUGMENTIN) 875-125 MG tablet [id number]  Take 1 tablet by mouth every 12 (twelve) hours for 5 days. Ezekiel Slocumb, DO  Active   apixaban (ELIQUIS) 5 MG TABS tablet [id number]  Take 1 tablet (5 mg total) by mouth 2 (two) times daily. Luiz Ochoa, NP  Active Self           Med Note Betsy Pries I   YQM Jun 10, 2021  2:14 AM)    famotidine (PEPCID) 20 MG tablet [id number]  Take 1 tablet (20 mg total) by mouth 2 (two) times daily. Ronnell Freshwater, NP  Active Self  iron polysaccharides (NIFEREX) 150 MG capsule [id number]  Take 1 capsule (150 mg total) by mouth daily. Sharen Hones, MD  Active Self  metoprolol succinate (TOPROL-XL) 50 MG 24 hr tablet [id number]  Take 1 tablet by mouth once daily [name], MD  Active Self  oxyCODONE-acetaminophen (PERCOCET/ROXICET) 5-325 MG tablet [id number]  Take 1-2 tablets by  mouth every 4 (four) hours as needed for up to 3 days for moderate pain or severe pain. Nicole Kindred A, DO  Active   phenylephrine-shark liver oil-mineral oil-petrolatum (PREPARATION H) 0.25-14-74.9 % rectal ointment [id number]  Place 1 application rectally 2 (two) times daily as needed for hemorrhoids. Sidney Ace, MD  Active Self  rosuvastatin (CRESTOR) 20 MG tablet [id number]  Take 1 tablet (20 mg total) by mouth daily. [name], MD  Active Self  senna-docusate (SENOKOT-S) 8.6-50 MG tablet [id number]  Take 1 tablet by mouth 2 (two) times daily. Sidney Ace, MD  Active Self  VENTOLIN HFA 108 (90 Base) MCG/ACT inhaler [id number]  Inhale 2 puffs into the lungs every 6 (six) hours as needed for wheezing or shortness of breath. Ronnell Freshwater, NP  Active Self           Med Note Knox Royalty   Fri Aug 12, 2020 10:46 AM) Reports uses prn- reports uses 1-2 times per day  vitamin B-12 1000 MCG tablet [id number]  Take 1 tablet (1,000 mcg total) by mouth daily. Sharen Hones, MD  Active Self            Patient Active Problem List   Diagnosis Date Noted   Scrotal bleeding 06/10/2021   Abscess 05/18/2021   Fournier's gangrene of scrotum 05/18/2021   Fournier gangrene 05/18/2021   Sepsis ([place]) 05/18/2021   Functional quadriplegia ([place]) 05/18/2021   Bacteremia due to Gram-negative bacteria 05/07/2021   Constipated 05/06/2021   Physical deconditioning 05/06/2021   Atherosclerosis of aorta ([place]) 09/28/2020   Acute on chronic heart failure ([place]) 08/02/2020   Acute saddle pulmonary embolism ([place]) 08/02/2020   Hypoxia 08/01/2020   Weakness 08/01/2020   Abnormality of gait and mobility 08/01/2020   AKI (acute kidney injury) ([place]) 07/21/2020   Hypotension 07/20/2020   Leukocytosis 07/20/2020   Cellulitis of right lower extremity 07/20/2020   Pressure injury of skin 07/12/2020   Acute respiratory failure with hypoxia ([place]) 07/10/2020   COPD with acute exacerbation (HCC)     Elevated troponin    Chronic diastolic CHF (congestive heart failure) ([place]) 05/24/2020   Anasarca    Acute renal failure superimposed on stage 3a  chronic kidney disease ([place])    Hyperlipidemia    Tobacco abuse    Shortness of breath 04/28/2020   Upper respiratory tract infection due to COVID-19 virus 04/28/2020   Edema 04/28/2020   Encounter for general adult medical examination with abnormal findings 01/15/2019   Acute non-recurrent maxillary sinusitis 01/15/2019   Gastroesophageal reflux disease without esophagitis 01/15/2019   CAD in native artery 12/16/2018   Morbid obesity with BMI of 50.0-59.9, adult ([place]) 12/16/2018   Cigarette smoker motivated to quit 12/16/2018   Chronic pain of right knee 08/04/2018   Mild intermittent asthma 08/04/2018   Impaired fasting glucose 04/03/2018   Low back pain with right-sided sciatica 03/05/2018   Chronic right-sided low back pain with right-sided sciatica 03/05/2018   Pain in right hip 03/05/2018   Hypertension 03/05/2018   Foreign body in bladder and urethra 06/09/2013   Kidney stone 06/09/2013   Ureteric stone 06/09/2013   Kidney stone 06/09/2013    Immunization History  Administered Date(s) Administered   Pneumococcal Polysaccharide-23 08/04/2020    Conditions to be addressed/monitored:  HTN, CAD, HF, COPD, HLD, HX of PE  There are no care plans that you recently modified to display for this patient.    Medication Assistance: {MEDASSISTANCEINFO:25044}  Compliance/Adherence/Medication fill history: Care Gaps: Colonoscopy Urine Microalbumin  Star-Rating Drugs: Rosuvastatin 06/09/21 90 DS.  Patient's preferred pharmacy is:  [facility] [address] (N), [place] - [place]) [place] [address] Phone: [phone] Fax: [phone]  [facility]  ([place]) - [place], [place] [facility] [address] [place] [address] [place]  [address] Phone: [phone] Fax: [phone]  Uses pill box? {Yes or If no, why not?:20788} Pt endorses ***% compliance  We discussed: {Pharmacy options:24294} Patient decided to: {US Pharmacy Plan:23885}  Care Plan and Follow Up Patient Decision:  {FOLLOWUP:24991}  Plan: {CM FOLLOW UP TDVV:61607}  ***  Current Barriers:  {pharmacybarriers:24917}  Pharmacist Clinical Goal(s):  Patient will {PHARMACYGOALCHOICES:24921} through collaboration with PharmD and provider.   Interventions: 1:1 collaboration with [name], MD regarding development and update of comprehensive plan of care as evidenced by provider attestation and co-signature Inter-disciplinary care team collaboration (see longitudinal plan of care) Comprehensive medication review performed; medication list updated in electronic medical record  Hypertension (BP goal {CHL [facility] Pharmacist BP ranges:[phone]}) -{US controlled/uncontrolled:25276} -Current treatment: *** -Medications previously tried: ***  -Current home readings: *** -Current dietary habits: *** -Current exercise habits: *** -{ACTIONS;DENIES/REPORTS:21021675::"Denies"} hypotensive/hypertensive symptoms -Educated on {CCM BP Counseling:25124} -Counseled to monitor BP at home ***, document, and provide log at future appointments -{CCMPHARMDINTERVENTION:25122}  Hyperlipidemia/CAD: (LDL goal < ***) -{US controlled/uncontrolled:25276} -Current treatment: *** -Medications previously tried: ***  -Current dietary patterns: *** -Current exercise habits: *** -Educated on {CCM HLD Counseling:25126} -{CCMPHARMDINTERVENTION:25122}  Heart Failure (Goal: manage symptoms and prevent exacerbations) -{US controlled/uncontrolled:25276} -Last ejection fraction: *** (Date: ***) -HF type: {type of heart failure:30421350} -NYHA Class: {CHL [facility] Pharm NYHA Class:[id number]} -AHA HF Stage: {CHL [facility] Pharm AHA HF Stage:[phone]} -Current  treatment: *** -Medications previously tried: ***  -Current home BP/HR readings: *** -Current dietary habits: *** -Current exercise habits: *** -Educated on {CCM HF Counseling:25125} -{CCMPHARMDINTERVENTION:25122}  COPD (Goal: control symptoms and prevent exacerbations) -{US controlled/uncontrolled:25276} -Current treatment  *** -Medications previously tried: ***  -Gold Grade: {CHL [facility] Pharm COPD Gold PXTGG:[phone]} -Current COPD Classification:  {CHL [facility] Pharm COPD [phone]} -MMRC/CAT score: *** -Pulmonary function testing: *** -Exacerbations requiring treatment  in last 6 months: *** -Patient {Actions; denies-reports:120008} consistent use of maintenance inhaler -Frequency of rescue inhaler use: *** -Counseled on {CCMINHALERCOUNSELING:25121} -{CCMPHARMDINTERVENTION:25122}  Hx of PE (Goal: ***) -{US controlled/uncontrolled:25276} -Current treatment  Eliquis 70m BID -Medications previously tried: ***  -{CCMPHARMDINTERVENTION:25122}  Patient Goals/Self-Care Activities Patient will:  - {pharmacypatientgoals:24919}  Follow Up Plan: {CM FOLLOW UP PVDPB:22567}

## 2021-08-17 DIAGNOSIS — E871 Hypo-osmolality and hyponatremia: Secondary | ICD-10-CM | POA: Diagnosis not present

## 2021-08-17 DIAGNOSIS — Z9981 Dependence on supplemental oxygen: Secondary | ICD-10-CM | POA: Diagnosis not present

## 2021-08-17 DIAGNOSIS — I5032 Chronic diastolic (congestive) heart failure: Secondary | ICD-10-CM | POA: Diagnosis not present

## 2021-08-17 DIAGNOSIS — K5909 Other constipation: Secondary | ICD-10-CM | POA: Diagnosis not present

## 2021-08-17 DIAGNOSIS — J449 Chronic obstructive pulmonary disease, unspecified: Secondary | ICD-10-CM | POA: Diagnosis not present

## 2021-08-17 DIAGNOSIS — R532 Functional quadriplegia: Secondary | ICD-10-CM | POA: Diagnosis not present

## 2021-08-17 DIAGNOSIS — J9611 Chronic respiratory failure with hypoxia: Secondary | ICD-10-CM | POA: Diagnosis not present

## 2021-08-17 DIAGNOSIS — J4520 Mild intermittent asthma, uncomplicated: Secondary | ICD-10-CM | POA: Diagnosis not present

## 2021-08-17 DIAGNOSIS — D509 Iron deficiency anemia, unspecified: Secondary | ICD-10-CM | POA: Diagnosis not present

## 2021-08-17 DIAGNOSIS — I2510 Atherosclerotic heart disease of native coronary artery without angina pectoris: Secondary | ICD-10-CM | POA: Diagnosis not present

## 2021-08-17 DIAGNOSIS — K219 Gastro-esophageal reflux disease without esophagitis: Secondary | ICD-10-CM | POA: Diagnosis not present

## 2021-08-17 DIAGNOSIS — F17210 Nicotine dependence, cigarettes, uncomplicated: Secondary | ICD-10-CM | POA: Diagnosis not present

## 2021-08-17 DIAGNOSIS — R1310 Dysphagia, unspecified: Secondary | ICD-10-CM | POA: Diagnosis not present

## 2021-08-17 DIAGNOSIS — Z4800 Encounter for change or removal of nonsurgical wound dressing: Secondary | ICD-10-CM | POA: Diagnosis not present

## 2021-08-17 DIAGNOSIS — Z86711 Personal history of pulmonary embolism: Secondary | ICD-10-CM | POA: Diagnosis not present

## 2021-08-17 DIAGNOSIS — N1831 Chronic kidney disease, stage 3a: Secondary | ICD-10-CM | POA: Diagnosis not present

## 2021-08-17 DIAGNOSIS — E785 Hyperlipidemia, unspecified: Secondary | ICD-10-CM | POA: Diagnosis not present

## 2021-08-17 DIAGNOSIS — Z7901 Long term (current) use of anticoagulants: Secondary | ICD-10-CM | POA: Diagnosis not present

## 2021-08-17 DIAGNOSIS — E872 Acidosis: Secondary | ICD-10-CM | POA: Diagnosis not present

## 2021-08-17 DIAGNOSIS — I130 Hypertensive heart and chronic kidney disease with heart failure and stage 1 through stage 4 chronic kidney disease, or unspecified chronic kidney disease: Secondary | ICD-10-CM | POA: Diagnosis not present

## 2021-08-17 DIAGNOSIS — I700 Atherosclerosis of aorta: Secondary | ICD-10-CM | POA: Diagnosis not present

## 2021-08-18 DIAGNOSIS — R1310 Dysphagia, unspecified: Secondary | ICD-10-CM | POA: Diagnosis not present

## 2021-08-18 DIAGNOSIS — Z9981 Dependence on supplemental oxygen: Secondary | ICD-10-CM | POA: Diagnosis not present

## 2021-08-18 DIAGNOSIS — K219 Gastro-esophageal reflux disease without esophagitis: Secondary | ICD-10-CM | POA: Diagnosis not present

## 2021-08-18 DIAGNOSIS — R532 Functional quadriplegia: Secondary | ICD-10-CM | POA: Diagnosis not present

## 2021-08-18 DIAGNOSIS — E785 Hyperlipidemia, unspecified: Secondary | ICD-10-CM | POA: Diagnosis not present

## 2021-08-18 DIAGNOSIS — I700 Atherosclerosis of aorta: Secondary | ICD-10-CM | POA: Diagnosis not present

## 2021-08-18 DIAGNOSIS — I2510 Atherosclerotic heart disease of native coronary artery without angina pectoris: Secondary | ICD-10-CM | POA: Diagnosis not present

## 2021-08-18 DIAGNOSIS — K5909 Other constipation: Secondary | ICD-10-CM | POA: Diagnosis not present

## 2021-08-18 DIAGNOSIS — Z86711 Personal history of pulmonary embolism: Secondary | ICD-10-CM | POA: Diagnosis not present

## 2021-08-18 DIAGNOSIS — Z7901 Long term (current) use of anticoagulants: Secondary | ICD-10-CM | POA: Diagnosis not present

## 2021-08-18 DIAGNOSIS — E872 Acidosis: Secondary | ICD-10-CM | POA: Diagnosis not present

## 2021-08-18 DIAGNOSIS — D509 Iron deficiency anemia, unspecified: Secondary | ICD-10-CM | POA: Diagnosis not present

## 2021-08-18 DIAGNOSIS — N1831 Chronic kidney disease, stage 3a: Secondary | ICD-10-CM | POA: Diagnosis not present

## 2021-08-18 DIAGNOSIS — J449 Chronic obstructive pulmonary disease, unspecified: Secondary | ICD-10-CM | POA: Diagnosis not present

## 2021-08-18 DIAGNOSIS — E871 Hypo-osmolality and hyponatremia: Secondary | ICD-10-CM | POA: Diagnosis not present

## 2021-08-18 DIAGNOSIS — J9611 Chronic respiratory failure with hypoxia: Secondary | ICD-10-CM | POA: Diagnosis not present

## 2021-08-18 DIAGNOSIS — J4520 Mild intermittent asthma, uncomplicated: Secondary | ICD-10-CM | POA: Diagnosis not present

## 2021-08-18 DIAGNOSIS — I130 Hypertensive heart and chronic kidney disease with heart failure and stage 1 through stage 4 chronic kidney disease, or unspecified chronic kidney disease: Secondary | ICD-10-CM | POA: Diagnosis not present

## 2021-08-18 DIAGNOSIS — Z4800 Encounter for change or removal of nonsurgical wound dressing: Secondary | ICD-10-CM | POA: Diagnosis not present

## 2021-08-18 DIAGNOSIS — I5032 Chronic diastolic (congestive) heart failure: Secondary | ICD-10-CM | POA: Diagnosis not present

## 2021-08-18 DIAGNOSIS — F17210 Nicotine dependence, cigarettes, uncomplicated: Secondary | ICD-10-CM | POA: Diagnosis not present

## 2021-08-21 ENCOUNTER — Telehealth: Payer: Self-pay

## 2021-08-21 DIAGNOSIS — N1831 Chronic kidney disease, stage 3a: Secondary | ICD-10-CM | POA: Diagnosis not present

## 2021-08-21 DIAGNOSIS — J449 Chronic obstructive pulmonary disease, unspecified: Secondary | ICD-10-CM | POA: Diagnosis not present

## 2021-08-21 DIAGNOSIS — Z4800 Encounter for change or removal of nonsurgical wound dressing: Secondary | ICD-10-CM | POA: Diagnosis not present

## 2021-08-21 DIAGNOSIS — D509 Iron deficiency anemia, unspecified: Secondary | ICD-10-CM | POA: Diagnosis not present

## 2021-08-21 DIAGNOSIS — K219 Gastro-esophageal reflux disease without esophagitis: Secondary | ICD-10-CM | POA: Diagnosis not present

## 2021-08-21 DIAGNOSIS — I2510 Atherosclerotic heart disease of native coronary artery without angina pectoris: Secondary | ICD-10-CM | POA: Diagnosis not present

## 2021-08-21 DIAGNOSIS — J9611 Chronic respiratory failure with hypoxia: Secondary | ICD-10-CM | POA: Diagnosis not present

## 2021-08-21 DIAGNOSIS — E785 Hyperlipidemia, unspecified: Secondary | ICD-10-CM | POA: Diagnosis not present

## 2021-08-21 DIAGNOSIS — J4520 Mild intermittent asthma, uncomplicated: Secondary | ICD-10-CM | POA: Diagnosis not present

## 2021-08-21 DIAGNOSIS — E871 Hypo-osmolality and hyponatremia: Secondary | ICD-10-CM | POA: Diagnosis not present

## 2021-08-21 DIAGNOSIS — Z86711 Personal history of pulmonary embolism: Secondary | ICD-10-CM | POA: Diagnosis not present

## 2021-08-21 DIAGNOSIS — K5909 Other constipation: Secondary | ICD-10-CM | POA: Diagnosis not present

## 2021-08-21 DIAGNOSIS — Z7901 Long term (current) use of anticoagulants: Secondary | ICD-10-CM | POA: Diagnosis not present

## 2021-08-21 DIAGNOSIS — Z9981 Dependence on supplemental oxygen: Secondary | ICD-10-CM | POA: Diagnosis not present

## 2021-08-21 DIAGNOSIS — I5032 Chronic diastolic (congestive) heart failure: Secondary | ICD-10-CM | POA: Diagnosis not present

## 2021-08-21 DIAGNOSIS — I700 Atherosclerosis of aorta: Secondary | ICD-10-CM | POA: Diagnosis not present

## 2021-08-21 DIAGNOSIS — R1310 Dysphagia, unspecified: Secondary | ICD-10-CM | POA: Diagnosis not present

## 2021-08-21 DIAGNOSIS — F17210 Nicotine dependence, cigarettes, uncomplicated: Secondary | ICD-10-CM | POA: Diagnosis not present

## 2021-08-21 DIAGNOSIS — I130 Hypertensive heart and chronic kidney disease with heart failure and stage 1 through stage 4 chronic kidney disease, or unspecified chronic kidney disease: Secondary | ICD-10-CM | POA: Diagnosis not present

## 2021-08-21 DIAGNOSIS — R532 Functional quadriplegia: Secondary | ICD-10-CM | POA: Diagnosis not present

## 2021-08-21 DIAGNOSIS — E872 Acidosis: Secondary | ICD-10-CM | POA: Diagnosis not present

## 2021-08-21 NOTE — Telephone Encounter (Signed)
Oxygen therapy order signed by provider and placed in AHP folder. 

## 2021-08-22 ENCOUNTER — Ambulatory Visit (INDEPENDENT_AMBULATORY_CARE_PROVIDER_SITE_OTHER): Payer: Medicare Other | Admitting: Nurse Practitioner

## 2021-08-23 ENCOUNTER — Institutional Professional Consult (permissible substitution): Payer: Medicare Other | Admitting: Plastic Surgery

## 2021-08-23 ENCOUNTER — Other Ambulatory Visit: Payer: Self-pay | Admitting: Surgical

## 2021-08-23 DIAGNOSIS — Z87438 Personal history of other diseases of male genital organs: Secondary | ICD-10-CM

## 2021-08-24 DIAGNOSIS — J4520 Mild intermittent asthma, uncomplicated: Secondary | ICD-10-CM | POA: Diagnosis not present

## 2021-08-24 DIAGNOSIS — Z9981 Dependence on supplemental oxygen: Secondary | ICD-10-CM | POA: Diagnosis not present

## 2021-08-24 DIAGNOSIS — Z86711 Personal history of pulmonary embolism: Secondary | ICD-10-CM | POA: Diagnosis not present

## 2021-08-24 DIAGNOSIS — J9611 Chronic respiratory failure with hypoxia: Secondary | ICD-10-CM | POA: Diagnosis not present

## 2021-08-24 DIAGNOSIS — K219 Gastro-esophageal reflux disease without esophagitis: Secondary | ICD-10-CM | POA: Diagnosis not present

## 2021-08-24 DIAGNOSIS — Z7901 Long term (current) use of anticoagulants: Secondary | ICD-10-CM | POA: Diagnosis not present

## 2021-08-24 DIAGNOSIS — R532 Functional quadriplegia: Secondary | ICD-10-CM | POA: Diagnosis not present

## 2021-08-24 DIAGNOSIS — I700 Atherosclerosis of aorta: Secondary | ICD-10-CM | POA: Diagnosis not present

## 2021-08-24 DIAGNOSIS — I2510 Atherosclerotic heart disease of native coronary artery without angina pectoris: Secondary | ICD-10-CM | POA: Diagnosis not present

## 2021-08-24 DIAGNOSIS — Z4800 Encounter for change or removal of nonsurgical wound dressing: Secondary | ICD-10-CM | POA: Diagnosis not present

## 2021-08-24 DIAGNOSIS — K5909 Other constipation: Secondary | ICD-10-CM | POA: Diagnosis not present

## 2021-08-24 DIAGNOSIS — J449 Chronic obstructive pulmonary disease, unspecified: Secondary | ICD-10-CM | POA: Diagnosis not present

## 2021-08-24 DIAGNOSIS — N1831 Chronic kidney disease, stage 3a: Secondary | ICD-10-CM | POA: Diagnosis not present

## 2021-08-24 DIAGNOSIS — E785 Hyperlipidemia, unspecified: Secondary | ICD-10-CM | POA: Diagnosis not present

## 2021-08-24 DIAGNOSIS — R1310 Dysphagia, unspecified: Secondary | ICD-10-CM | POA: Diagnosis not present

## 2021-08-24 DIAGNOSIS — E871 Hypo-osmolality and hyponatremia: Secondary | ICD-10-CM | POA: Diagnosis not present

## 2021-08-24 DIAGNOSIS — D509 Iron deficiency anemia, unspecified: Secondary | ICD-10-CM | POA: Diagnosis not present

## 2021-08-24 DIAGNOSIS — E872 Acidosis: Secondary | ICD-10-CM | POA: Diagnosis not present

## 2021-08-24 DIAGNOSIS — F17210 Nicotine dependence, cigarettes, uncomplicated: Secondary | ICD-10-CM | POA: Diagnosis not present

## 2021-08-24 DIAGNOSIS — I130 Hypertensive heart and chronic kidney disease with heart failure and stage 1 through stage 4 chronic kidney disease, or unspecified chronic kidney disease: Secondary | ICD-10-CM | POA: Diagnosis not present

## 2021-08-24 DIAGNOSIS — I5032 Chronic diastolic (congestive) heart failure: Secondary | ICD-10-CM | POA: Diagnosis not present

## 2021-08-25 ENCOUNTER — Ambulatory Visit: Payer: Medicare Other

## 2021-08-28 ENCOUNTER — Telehealth: Payer: Self-pay | Admitting: Surgical

## 2021-08-28 DIAGNOSIS — N1831 Chronic kidney disease, stage 3a: Secondary | ICD-10-CM | POA: Diagnosis not present

## 2021-08-28 DIAGNOSIS — E785 Hyperlipidemia, unspecified: Secondary | ICD-10-CM | POA: Diagnosis not present

## 2021-08-28 DIAGNOSIS — Z9981 Dependence on supplemental oxygen: Secondary | ICD-10-CM | POA: Diagnosis not present

## 2021-08-28 DIAGNOSIS — J4520 Mild intermittent asthma, uncomplicated: Secondary | ICD-10-CM | POA: Diagnosis not present

## 2021-08-28 DIAGNOSIS — Z4800 Encounter for change or removal of nonsurgical wound dressing: Secondary | ICD-10-CM | POA: Diagnosis not present

## 2021-08-28 DIAGNOSIS — R1310 Dysphagia, unspecified: Secondary | ICD-10-CM | POA: Diagnosis not present

## 2021-08-28 DIAGNOSIS — I5032 Chronic diastolic (congestive) heart failure: Secondary | ICD-10-CM | POA: Diagnosis not present

## 2021-08-28 DIAGNOSIS — E872 Acidosis: Secondary | ICD-10-CM | POA: Diagnosis not present

## 2021-08-28 DIAGNOSIS — Z86711 Personal history of pulmonary embolism: Secondary | ICD-10-CM | POA: Diagnosis not present

## 2021-08-28 DIAGNOSIS — D509 Iron deficiency anemia, unspecified: Secondary | ICD-10-CM | POA: Diagnosis not present

## 2021-08-28 DIAGNOSIS — I2510 Atherosclerotic heart disease of native coronary artery without angina pectoris: Secondary | ICD-10-CM | POA: Diagnosis not present

## 2021-08-28 DIAGNOSIS — E871 Hypo-osmolality and hyponatremia: Secondary | ICD-10-CM | POA: Diagnosis not present

## 2021-08-28 DIAGNOSIS — Z7901 Long term (current) use of anticoagulants: Secondary | ICD-10-CM | POA: Diagnosis not present

## 2021-08-28 DIAGNOSIS — J449 Chronic obstructive pulmonary disease, unspecified: Secondary | ICD-10-CM | POA: Diagnosis not present

## 2021-08-28 DIAGNOSIS — J9611 Chronic respiratory failure with hypoxia: Secondary | ICD-10-CM | POA: Diagnosis not present

## 2021-08-28 DIAGNOSIS — R532 Functional quadriplegia: Secondary | ICD-10-CM | POA: Diagnosis not present

## 2021-08-28 DIAGNOSIS — K219 Gastro-esophageal reflux disease without esophagitis: Secondary | ICD-10-CM | POA: Diagnosis not present

## 2021-08-28 DIAGNOSIS — I130 Hypertensive heart and chronic kidney disease with heart failure and stage 1 through stage 4 chronic kidney disease, or unspecified chronic kidney disease: Secondary | ICD-10-CM | POA: Diagnosis not present

## 2021-08-28 DIAGNOSIS — K5909 Other constipation: Secondary | ICD-10-CM | POA: Diagnosis not present

## 2021-08-28 DIAGNOSIS — I700 Atherosclerosis of aorta: Secondary | ICD-10-CM | POA: Diagnosis not present

## 2021-08-28 DIAGNOSIS — F17210 Nicotine dependence, cigarettes, uncomplicated: Secondary | ICD-10-CM | POA: Diagnosis not present

## 2021-08-28 NOTE — Telephone Encounter (Signed)
Called patient Thomas Tanner directly to discuss with him that I have placed a referral for him to the wound care center for further evaluation of his groin wound.  Patient was thankful and appreciative.

## 2021-08-29 ENCOUNTER — Ambulatory Visit (INDEPENDENT_AMBULATORY_CARE_PROVIDER_SITE_OTHER): Payer: Medicare Other | Admitting: Nurse Practitioner

## 2021-08-30 DIAGNOSIS — I130 Hypertensive heart and chronic kidney disease with heart failure and stage 1 through stage 4 chronic kidney disease, or unspecified chronic kidney disease: Secondary | ICD-10-CM | POA: Diagnosis not present

## 2021-08-30 DIAGNOSIS — R532 Functional quadriplegia: Secondary | ICD-10-CM | POA: Diagnosis not present

## 2021-08-30 DIAGNOSIS — J4520 Mild intermittent asthma, uncomplicated: Secondary | ICD-10-CM | POA: Diagnosis not present

## 2021-08-30 DIAGNOSIS — N1831 Chronic kidney disease, stage 3a: Secondary | ICD-10-CM | POA: Diagnosis not present

## 2021-08-30 DIAGNOSIS — E785 Hyperlipidemia, unspecified: Secondary | ICD-10-CM | POA: Diagnosis not present

## 2021-08-30 DIAGNOSIS — F17210 Nicotine dependence, cigarettes, uncomplicated: Secondary | ICD-10-CM | POA: Diagnosis not present

## 2021-08-30 DIAGNOSIS — K5909 Other constipation: Secondary | ICD-10-CM | POA: Diagnosis not present

## 2021-08-30 DIAGNOSIS — E871 Hypo-osmolality and hyponatremia: Secondary | ICD-10-CM | POA: Diagnosis not present

## 2021-08-30 DIAGNOSIS — E872 Acidosis: Secondary | ICD-10-CM | POA: Diagnosis not present

## 2021-08-30 DIAGNOSIS — K219 Gastro-esophageal reflux disease without esophagitis: Secondary | ICD-10-CM | POA: Diagnosis not present

## 2021-08-30 DIAGNOSIS — Z4800 Encounter for change or removal of nonsurgical wound dressing: Secondary | ICD-10-CM | POA: Diagnosis not present

## 2021-08-30 DIAGNOSIS — Z7901 Long term (current) use of anticoagulants: Secondary | ICD-10-CM | POA: Diagnosis not present

## 2021-08-30 DIAGNOSIS — I700 Atherosclerosis of aorta: Secondary | ICD-10-CM | POA: Diagnosis not present

## 2021-08-30 DIAGNOSIS — J9611 Chronic respiratory failure with hypoxia: Secondary | ICD-10-CM | POA: Diagnosis not present

## 2021-08-30 DIAGNOSIS — R1310 Dysphagia, unspecified: Secondary | ICD-10-CM | POA: Diagnosis not present

## 2021-08-30 DIAGNOSIS — Z9981 Dependence on supplemental oxygen: Secondary | ICD-10-CM | POA: Diagnosis not present

## 2021-08-30 DIAGNOSIS — Z86711 Personal history of pulmonary embolism: Secondary | ICD-10-CM | POA: Diagnosis not present

## 2021-08-30 DIAGNOSIS — D509 Iron deficiency anemia, unspecified: Secondary | ICD-10-CM | POA: Diagnosis not present

## 2021-08-30 DIAGNOSIS — I2510 Atherosclerotic heart disease of native coronary artery without angina pectoris: Secondary | ICD-10-CM | POA: Diagnosis not present

## 2021-08-30 DIAGNOSIS — I5032 Chronic diastolic (congestive) heart failure: Secondary | ICD-10-CM | POA: Diagnosis not present

## 2021-08-30 DIAGNOSIS — J449 Chronic obstructive pulmonary disease, unspecified: Secondary | ICD-10-CM | POA: Diagnosis not present

## 2021-08-31 DIAGNOSIS — J449 Chronic obstructive pulmonary disease, unspecified: Secondary | ICD-10-CM | POA: Diagnosis not present

## 2021-08-31 DIAGNOSIS — J4520 Mild intermittent asthma, uncomplicated: Secondary | ICD-10-CM | POA: Diagnosis not present

## 2021-08-31 DIAGNOSIS — I700 Atherosclerosis of aorta: Secondary | ICD-10-CM | POA: Diagnosis not present

## 2021-08-31 DIAGNOSIS — D509 Iron deficiency anemia, unspecified: Secondary | ICD-10-CM | POA: Diagnosis not present

## 2021-08-31 DIAGNOSIS — Z7901 Long term (current) use of anticoagulants: Secondary | ICD-10-CM | POA: Diagnosis not present

## 2021-08-31 DIAGNOSIS — Z9981 Dependence on supplemental oxygen: Secondary | ICD-10-CM | POA: Diagnosis not present

## 2021-08-31 DIAGNOSIS — R532 Functional quadriplegia: Secondary | ICD-10-CM | POA: Diagnosis not present

## 2021-08-31 DIAGNOSIS — N1831 Chronic kidney disease, stage 3a: Secondary | ICD-10-CM | POA: Diagnosis not present

## 2021-08-31 DIAGNOSIS — K219 Gastro-esophageal reflux disease without esophagitis: Secondary | ICD-10-CM | POA: Diagnosis not present

## 2021-08-31 DIAGNOSIS — Z4800 Encounter for change or removal of nonsurgical wound dressing: Secondary | ICD-10-CM | POA: Diagnosis not present

## 2021-08-31 DIAGNOSIS — I5032 Chronic diastolic (congestive) heart failure: Secondary | ICD-10-CM | POA: Diagnosis not present

## 2021-08-31 DIAGNOSIS — E785 Hyperlipidemia, unspecified: Secondary | ICD-10-CM | POA: Diagnosis not present

## 2021-08-31 DIAGNOSIS — R1310 Dysphagia, unspecified: Secondary | ICD-10-CM | POA: Diagnosis not present

## 2021-08-31 DIAGNOSIS — E872 Acidosis: Secondary | ICD-10-CM | POA: Diagnosis not present

## 2021-08-31 DIAGNOSIS — Z86711 Personal history of pulmonary embolism: Secondary | ICD-10-CM | POA: Diagnosis not present

## 2021-08-31 DIAGNOSIS — E871 Hypo-osmolality and hyponatremia: Secondary | ICD-10-CM | POA: Diagnosis not present

## 2021-08-31 DIAGNOSIS — K5909 Other constipation: Secondary | ICD-10-CM | POA: Diagnosis not present

## 2021-08-31 DIAGNOSIS — I2510 Atherosclerotic heart disease of native coronary artery without angina pectoris: Secondary | ICD-10-CM | POA: Diagnosis not present

## 2021-08-31 DIAGNOSIS — F17210 Nicotine dependence, cigarettes, uncomplicated: Secondary | ICD-10-CM | POA: Diagnosis not present

## 2021-08-31 DIAGNOSIS — J9611 Chronic respiratory failure with hypoxia: Secondary | ICD-10-CM | POA: Diagnosis not present

## 2021-08-31 DIAGNOSIS — I130 Hypertensive heart and chronic kidney disease with heart failure and stage 1 through stage 4 chronic kidney disease, or unspecified chronic kidney disease: Secondary | ICD-10-CM | POA: Diagnosis not present

## 2021-09-01 ENCOUNTER — Other Ambulatory Visit: Payer: Self-pay | Admitting: *Deleted

## 2021-09-01 DIAGNOSIS — J449 Chronic obstructive pulmonary disease, unspecified: Secondary | ICD-10-CM | POA: Diagnosis not present
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
## 2021-09-04 DIAGNOSIS — Z86711 Personal history of pulmonary embolism: Secondary | ICD-10-CM | POA: Diagnosis not present

## 2021-09-04 DIAGNOSIS — J9611 Chronic respiratory failure with hypoxia: Secondary | ICD-10-CM | POA: Diagnosis not present

## 2021-09-04 DIAGNOSIS — E871 Hypo-osmolality and hyponatremia: Secondary | ICD-10-CM | POA: Diagnosis not present

## 2021-09-04 DIAGNOSIS — J449 Chronic obstructive pulmonary disease, unspecified: Secondary | ICD-10-CM | POA: Diagnosis not present

## 2021-09-04 DIAGNOSIS — J4520 Mild intermittent asthma, uncomplicated: Secondary | ICD-10-CM | POA: Diagnosis not present

## 2021-09-04 DIAGNOSIS — K5909 Other constipation: Secondary | ICD-10-CM | POA: Diagnosis not present

## 2021-09-04 DIAGNOSIS — K219 Gastro-esophageal reflux disease without esophagitis: Secondary | ICD-10-CM | POA: Diagnosis not present

## 2021-09-04 DIAGNOSIS — Z4800 Encounter for change or removal of nonsurgical wound dressing: Secondary | ICD-10-CM | POA: Diagnosis not present

## 2021-09-04 DIAGNOSIS — I2510 Atherosclerotic heart disease of native coronary artery without angina pectoris: Secondary | ICD-10-CM | POA: Diagnosis not present

## 2021-09-04 DIAGNOSIS — E872 Acidosis: Secondary | ICD-10-CM | POA: Diagnosis not present

## 2021-09-04 DIAGNOSIS — I130 Hypertensive heart and chronic kidney disease with heart failure and stage 1 through stage 4 chronic kidney disease, or unspecified chronic kidney disease: Secondary | ICD-10-CM | POA: Diagnosis not present

## 2021-09-04 DIAGNOSIS — Z9981 Dependence on supplemental oxygen: Secondary | ICD-10-CM | POA: Diagnosis not present

## 2021-09-04 DIAGNOSIS — E785 Hyperlipidemia, unspecified: Secondary | ICD-10-CM | POA: Diagnosis not present

## 2021-09-04 DIAGNOSIS — F17210 Nicotine dependence, cigarettes, uncomplicated: Secondary | ICD-10-CM | POA: Diagnosis not present

## 2021-09-04 DIAGNOSIS — I700 Atherosclerosis of aorta: Secondary | ICD-10-CM | POA: Diagnosis not present

## 2021-09-04 DIAGNOSIS — I5032 Chronic diastolic (congestive) heart failure: Secondary | ICD-10-CM | POA: Diagnosis not present

## 2021-09-04 DIAGNOSIS — R1310 Dysphagia, unspecified: Secondary | ICD-10-CM | POA: Diagnosis not present

## 2021-09-04 DIAGNOSIS — R532 Functional quadriplegia: Secondary | ICD-10-CM | POA: Diagnosis not present

## 2021-09-04 DIAGNOSIS — Z7901 Long term (current) use of anticoagulants: Secondary | ICD-10-CM | POA: Diagnosis not present

## 2021-09-04 DIAGNOSIS — D509 Iron deficiency anemia, unspecified: Secondary | ICD-10-CM | POA: Diagnosis not present

## 2021-09-04 DIAGNOSIS — N1831 Chronic kidney disease, stage 3a: Secondary | ICD-10-CM | POA: Diagnosis not present

## 2021-09-06 NOTE — Progress Notes (Deleted)
Chronic Care Management Pharmacy Note  09/06/2021 Name:  Thomas Tanner MRN:  [id number] DOB:  07-04-1951  Summary: ***  Recommendations/Changes made from today's visit: ***  Plan: ***   Subjective: Thomas Tanner is an 70 y.o. year old male who is a primary patient of Thomas Rolls Timoteo Gaul, MD.  The CCM team was consulted for assistance with disease management and care coordination needs.    {CCMTELEPHONEFACETOFACE:[id number]} for initial visit in response to provider referral for pharmacy case management and/or care coordination services.   Consent to Services:  The patient was given the following information about Chronic Care Management services today, agreed to services, and gave verbal consent: 1. CCM service includes personalized support from designated clinical staff supervised by the primary care provider, including individualized plan of care and coordination with other care providers 2. 24/7 contact phone numbers for assistance for urgent and routine care needs. 3. Service will only be billed when office clinical staff spend 20 minutes or more in a month to coordinate care. 4. Only one practitioner may furnish and bill the service in a calendar month. 5.The patient may stop CCM services at any time (effective at the end of the month) by phone call to the office staff. 6. The patient will be responsible for cost sharing (co-pay) of up to 20% of the service fee (after annual deductible is met). Patient agreed to services and consent obtained.  Patient Care Team: [name], MD as PCP - General (Internal Medicine) [name], RN as [facility] [name], [facility] as Pharmacist (Pharmacist)  Recent office visits:  05/30/21 Dr. [name] For hospital follow-up. No medication changes   Recent consult visits:  06/06/21 Infectious Disease Tsosie Billing, MD. No medication changes. 05/21/21 Surgery Urology IRRIGATION AND DEBRIDEMENT  ABSCESS 05/20/21 Surgery Urology IRRIGATION AND [facility] visits:  06/10/21 [facility] (2 days) For Scrotal bleeding. STOPPED Polyethylene Glycol.  05/18/21 [facility] (9 days) STOPPED Furosemide, Lisinopril, Nicotine, and Omega.Per note: Surgery was preformed-INCISION AND DRAINAGE SCROTAL ABSCESS, cystoscopy with urethral dilation, and complex catheter placement.   05/05/21 [facility] (11 days) For Constipation. STOPPED Tramadol.    Medication History: Rosuvastatin 06/09/21 90 DS.    Objective:  Lab Results  Component Value Date   CREATININE 1.01 06/11/2021   BUN 9 06/10/2021   GFRNONAA >60 06/11/2021   GFRAA >60 08/07/2020   NA 134 (L) 06/10/2021   K 3.8 06/10/2021   CALCIUM 8.4 (L) 06/10/2021   CO2 27 06/10/2021   GLUCOSE 97 06/10/2021    Lab Results  Component Value Date/Time   HGBA1C 6.4 (H) 05/19/2021 05:33 AM   HGBA1C 6.4 (H) 05/27/2020 05:46 AM   HGBA1C 6.7 (H) 06/11/2013 04:04 AM    Last diabetic Eye exam: No results found for: HMDIABEYEEXA  Last diabetic Foot exam: No results found for: HMDIABFOOTEX   Lab Results  Component Value Date   CHOL 163 12/08/2018   HDL 34 (L) 12/08/2018   LDLCALC 105 (H) 12/08/2018   TRIG 121 12/08/2018   CHOLHDL 4.8 12/08/2018    Hepatic Function Latest Ref Rng & Units 06/09/2021 05/19/2021 05/18/2021  Total Protein 6.5 - 8.1 g/dL 6.5 5.9(L) 6.7  Albumin 3.5 - 5.0 g/dL 2.6(L) 2.4(L) 2.6(L)  AST 15 - 41 U/L _0 ALT 0 - 44 U/L _1 Alk Phosphatase 38 - 126 U/L 92 68 79  Total Bilirubin 0.3 -  1.2 mg/dL 0.5 1.7(H) 1.6(H)  Bilirubin, Direct 0.0 - 0.2 mg/dL - - -    Lab Results  Component Value Date/Time   TSH 1.946 05/19/2021 02:26 AM   TSH 0.708 12/08/2018 06:57 AM    CBC Latest Ref Rng & Units 06/11/2021 06/10/2021 06/09/2021  WBC 4.0 - 10.5 K/uL 9.2 10.6(H) 10.8(H)  Hemoglobin 13.0 - 17.0 g/dL 10.0(L) 10.2(L) 10.7(L)  Hematocrit 39.0 -  52.0 % 27.7(L) 32.4(L) 28.5(L)  Platelets 150 - 400 K/uL 357 368 406(H)    No results found for: VD25OH  Clinical ASCVD: {YES/NO:21197} The ASCVD Risk score (Arnett DK, et al., 2019) failed to calculate for the following reasons:   The patient has a prior MI or stroke diagnosis    Depression screen [facility] 2/9 06/14/2021 05/30/2021 11/07/2020  Decreased Interest 0 3 0  Down, Depressed, Hopeless 1 3 0  PHQ - 2 Score 1 6 0  Altered sleeping - 1 -  Tired, decreased energy - 3 -  Change in appetite - 3 -  Feeling bad or failure about yourself  - 0 -  Trouble concentrating - 0 -  Moving slowly or fidgety/restless - 0 -  Suicidal thoughts - 0 -  PHQ-9 Score - 13 -     ***Other: (CHADS2VASc if Afib, MMRC or CAT for COPD, ACT, DEXA)  Social History   Tobacco Use  Smoking Status Every Day   Packs/day: 0.50   Types: Cigarettes   Last attempt to quit: 07/13/2020   Years since quitting: 1.1  Smokeless Tobacco Never   BP Readings from Last 3 Encounters:  06/12/21 118/67  05/27/21 126/74  05/16/21 (!) 109/59   Pulse Readings from Last 3 Encounters:  06/12/21 82  05/27/21 89  05/16/21 81   Wt Readings from Last 3 Encounters:  06/09/21 (!) 401 lb 3.8 oz (182 kg)  05/30/21 (!) 400 lb (181.4 kg)  05/18/21 (!) 421 lb (191 kg)   BMI Readings from Last 3 Encounters:  06/09/21 54.42 kg/m  05/30/21 52.77 kg/m  05/18/21 55.54 kg/m    Assessment/Interventions: Review of patient past medical history, allergies, medications, health status, including review of consultants reports, laboratory and other test data, was performed as part of comprehensive evaluation and provision of chronic care management services.   SDOH:  (Social Determinants of Health) assessments and interventions performed: {yes/no:20286}  SDOH Screenings   Alcohol Screen: Low Risk    Last Alcohol Screening Score (AUDIT): 0  Depression (PHQ2-9): Low Risk    PHQ-2 Score: 1  Financial Resource Strain: Not on file   Food Insecurity: No Food Insecurity   Worried About Charity fundraiser in the Last Year: Never true   Ran Out of Food in the Last Year: Never true  Housing: Low Risk    Last Housing Risk Score: 0  Physical Activity: Not on file  Social Connections: Not on file  Stress: Not on file  Tobacco Use: High Risk   Smoking Tobacco Use: Every Day   Smokeless Tobacco Use: Never  Transportation Needs: Unmet Transportation Needs   Lack of Transportation (Medical): Yes   Lack of Transportation (Non-Medical): Yes    CCM Care Plan  Allergies  Allergen Reactions   Morphine Anxiety and Other (See Comments)    Hallucinations  Pt states 6.17.22 that he is not allergic to this medication   Morphine And Related Anxiety    Medications Reviewed Today     Reviewed by [name], RN (Registered Nurse) on 06/14/21 at  [place] List Status: <None>   Medication Order Taking? Sig Documenting Provider Last Dose Status Informant  acidophilus (RISAQUAD) CAPS capsule [id number]  Take 1 capsule by mouth 3 (three) times daily for 14 days. Nicole Kindred A, DO  Active   amoxicillin-clavulanate (AUGMENTIN) 875-125 MG tablet [id number]  Take 1 tablet by mouth every 12 (twelve) hours for 5 days. Ezekiel Slocumb, DO  Active   apixaban (ELIQUIS) 5 MG TABS tablet [id number]  Take 1 tablet (5 mg total) by mouth 2 (two) times daily. Luiz Ochoa, NP  Active Self           Med Note Betsy Pries I   BSW Jun 10, 2021  2:14 AM)    famotidine (PEPCID) 20 MG tablet [id number]  Take 1 tablet (20 mg total) by mouth 2 (two) times daily. Ronnell Freshwater, NP  Active Self  iron polysaccharides (NIFEREX) 150 MG capsule [id number]  Take 1 capsule (150 mg total) by mouth daily. Sharen Hones, MD  Active Self  metoprolol succinate (TOPROL-XL) 50 MG 24 hr tablet [id number]  Take 1 tablet by mouth once daily [name], MD  Active Self  oxyCODONE-acetaminophen (PERCOCET/ROXICET) 5-325 MG tablet [id number]  Take 1-2 tablets by  mouth every 4 (four) hours as needed for up to 3 days for moderate pain or severe pain. Nicole Kindred A, DO  Active   phenylephrine-shark liver oil-mineral oil-petrolatum (PREPARATION H) 0.25-14-74.9 % rectal ointment [id number]  Place 1 application rectally 2 (two) times daily as needed for hemorrhoids. Sidney Ace, MD  Active Self  rosuvastatin (CRESTOR) 20 MG tablet [id number]  Take 1 tablet (20 mg total) by mouth daily. [name], MD  Active Self  senna-docusate (SENOKOT-S) 8.6-50 MG tablet [id number]  Take 1 tablet by mouth 2 (two) times daily. Sidney Ace, MD  Active Self  VENTOLIN HFA 108 (90 Base) MCG/ACT inhaler [id number]  Inhale 2 puffs into the lungs every 6 (six) hours as needed for wheezing or shortness of breath. Ronnell Freshwater, NP  Active Self           Med Note Knox Royalty   Fri Aug 12, 2020 10:46 AM) Reports uses prn- reports uses 1-2 times per day  vitamin B-12 1000 MCG tablet [id number]  Take 1 tablet (1,000 mcg total) by mouth daily. Sharen Hones, MD  Active Self            Patient Active Problem List   Diagnosis Date Noted   Scrotal bleeding 06/10/2021   Abscess 05/18/2021   Fournier's gangrene of scrotum 05/18/2021   Fournier gangrene 05/18/2021   Sepsis ([place]) 05/18/2021   Functional quadriplegia ([place]) 05/18/2021   Bacteremia due to Gram-negative bacteria 05/07/2021   Constipated 05/06/2021   Physical deconditioning 05/06/2021   Atherosclerosis of aorta ([place]) 09/28/2020   Acute on chronic heart failure ([place]) 08/02/2020   Acute saddle pulmonary embolism ([place]) 08/02/2020   Hypoxia 08/01/2020   Weakness 08/01/2020   Abnormality of gait and mobility 08/01/2020   AKI (acute kidney injury) ([place]) 07/21/2020   Hypotension 07/20/2020   Leukocytosis 07/20/2020   Cellulitis of right lower extremity 07/20/2020   Pressure injury of skin 07/12/2020   Acute respiratory failure with hypoxia ([place]) 07/10/2020   COPD with acute exacerbation (HCC)     Elevated troponin    Chronic diastolic CHF (congestive heart failure) ([place]) 05/24/2020   Anasarca    Acute renal failure superimposed on stage 3a chronic kidney disease ([place])  Hyperlipidemia    Tobacco abuse    Shortness of breath 04/28/2020   Upper respiratory tract infection due to COVID-19 virus 04/28/2020   Edema 04/28/2020   Encounter for general adult medical examination with abnormal findings 01/15/2019   Acute non-recurrent maxillary sinusitis 01/15/2019   Gastroesophageal reflux disease without esophagitis 01/15/2019   CAD in native artery 12/16/2018   Morbid obesity with BMI of 50.0-59.9, adult ([place]) 12/16/2018   Cigarette smoker motivated to quit 12/16/2018   Chronic pain of right knee 08/04/2018   Mild intermittent asthma 08/04/2018   Impaired fasting glucose 04/03/2018   Low back pain with right-sided sciatica 03/05/2018   Chronic right-sided low back pain with right-sided sciatica 03/05/2018   Pain in right hip 03/05/2018   Hypertension 03/05/2018   Foreign body in bladder and urethra 06/09/2013   Kidney stone 06/09/2013   Ureteric stone 06/09/2013   Kidney stone 06/09/2013    Immunization History  Administered Date(s) Administered   Pneumococcal Polysaccharide-23 08/04/2020    Conditions to be addressed/monitored:  HTN, CAD, CHF, COPD, HLD  There are no care plans that you recently modified to display for this patient.    Medication Assistance: {MEDASSISTANCEINFO:25044}  Compliance/Adherence/Medication fill history: Care Gaps: ***  Star-Rating Drugs: ***  Patient's preferred pharmacy is:  [facility] [address]), [place]) [place] [address] Phone: [phone] Fax: [phone]  [facility]  ([place]) - [place], [place] [facility] [address] [place] [address] [place] [address] Phone: [phone] Fax: [phone]  Uses pill box?  {Yes or If no, why not?:20788} Pt endorses ***% compliance  We discussed: {Pharmacy options:24294} Patient decided to: {US Pharmacy Plan:23885}  Care Plan and Follow Up Patient Decision:  {FOLLOWUP:24991}  Plan: {CM FOLLOW UP DJTT:01779}  ***   Current Barriers:  {pharmacybarriers:24917}  Pharmacist Clinical Goal(s):  Patient will {PHARMACYGOALCHOICES:24921} through collaboration with PharmD and provider.   Interventions: 1:1 collaboration with [name], MD regarding development and update of comprehensive plan of care as evidenced by provider attestation and co-signature Inter-disciplinary care team collaboration (see longitudinal plan of care) Comprehensive medication review performed; medication list updated in electronic medical record  Hypertension (BP goal {CHL HP UPSTREAM Pharmacist BP ranges:[phone]}) -{US controlled/uncontrolled:25276} -Current treatment: *** -Medications previously tried: ***  -Current home readings: *** -Current dietary habits: *** -Current exercise habits: *** -{ACTIONS;DENIES/REPORTS:21021675::"Denies"} hypotensive/hypertensive symptoms -Educated on {CCM BP Counseling:25124} -Counseled to monitor BP at home ***, document, and provide log at future appointments -{CCMPHARMDINTERVENTION:25122}  Hyperlipidemia: (LDL goal < ***) -{US controlled/uncontrolled:25276} -Current treatment: *** -Medications previously tried: ***  -Current dietary patterns: *** -Current exercise habits: *** -Educated on {CCM HLD Counseling:25126} -{CCMPHARMDINTERVENTION:25122}  Heart Failure (Goal: manage symptoms and prevent exacerbations) -{US controlled/uncontrolled:25276} -Last ejection fraction: *** (Date: ***) -HF type: {type of heart failure:30421350} -NYHA Class: {CHL HP Upstream Pharm NYHA [phone]} -AHA HF Stage: {CHL HP Upstream Pharm AHA HF Stage:[phone]} -Current treatment: *** -Medications previously tried: ***  -Current home  BP/HR readings: *** -Current dietary habits: *** -Current exercise habits: *** -Educated on {CCM HF Counseling:25125} -{CCMPHARMDINTERVENTION:25122}  COPD (Goal: control symptoms and prevent exacerbations) -{US controlled/uncontrolled:25276} -Current treatment  *** -Medications previously tried: ***  -Gold Grade: {CHL HP Upstream Pharm COPD Gold TJQZE:[phone]} -Current COPD Classification:  {CHL HP Upstream Pharm COPD Classification:[phone]} -MMRC/CAT score: *** -Pulmonary function testing: *** -Exacerbations requiring treatment in last 6 months: *** -Patient {Actions; denies-reports:120008} consistent use of maintenance inhaler -Frequency  of rescue inhaler use: *** -Counseled on {CCMINHALERCOUNSELING:25121} -{CCMPHARMDINTERVENTION:25122}  Patient Goals/Self-Care Activities Patient will:  - {pharmacypatientgoals:24919}  Follow Up Plan: {CM FOLLOW UP NOMV:67209}

## 2021-09-07 DIAGNOSIS — Z86711 Personal history of pulmonary embolism: Secondary | ICD-10-CM | POA: Diagnosis not present

## 2021-09-07 DIAGNOSIS — I2510 Atherosclerotic heart disease of native coronary artery without angina pectoris: Secondary | ICD-10-CM | POA: Diagnosis not present

## 2021-09-07 DIAGNOSIS — E871 Hypo-osmolality and hyponatremia: Secondary | ICD-10-CM | POA: Diagnosis not present

## 2021-09-07 DIAGNOSIS — I700 Atherosclerosis of aorta: Secondary | ICD-10-CM | POA: Diagnosis not present

## 2021-09-07 DIAGNOSIS — E785 Hyperlipidemia, unspecified: Secondary | ICD-10-CM | POA: Diagnosis not present

## 2021-09-07 DIAGNOSIS — I130 Hypertensive heart and chronic kidney disease with heart failure and stage 1 through stage 4 chronic kidney disease, or unspecified chronic kidney disease: Secondary | ICD-10-CM | POA: Diagnosis not present

## 2021-09-07 DIAGNOSIS — I5032 Chronic diastolic (congestive) heart failure: Secondary | ICD-10-CM | POA: Diagnosis not present

## 2021-09-07 DIAGNOSIS — Z4800 Encounter for change or removal of nonsurgical wound dressing: Secondary | ICD-10-CM | POA: Diagnosis not present

## 2021-09-07 DIAGNOSIS — R1310 Dysphagia, unspecified: Secondary | ICD-10-CM | POA: Diagnosis not present

## 2021-09-07 DIAGNOSIS — K219 Gastro-esophageal reflux disease without esophagitis: Secondary | ICD-10-CM | POA: Diagnosis not present

## 2021-09-07 DIAGNOSIS — E872 Acidosis: Secondary | ICD-10-CM | POA: Diagnosis not present

## 2021-09-07 DIAGNOSIS — J4520 Mild intermittent asthma, uncomplicated: Secondary | ICD-10-CM | POA: Diagnosis not present

## 2021-09-07 DIAGNOSIS — J9611 Chronic respiratory failure with hypoxia: Secondary | ICD-10-CM | POA: Diagnosis not present

## 2021-09-07 DIAGNOSIS — Z9981 Dependence on supplemental oxygen: Secondary | ICD-10-CM | POA: Diagnosis not present

## 2021-09-07 DIAGNOSIS — N1831 Chronic kidney disease, stage 3a: Secondary | ICD-10-CM | POA: Diagnosis not present

## 2021-09-07 DIAGNOSIS — R532 Functional quadriplegia: Secondary | ICD-10-CM | POA: Diagnosis not present

## 2021-09-07 DIAGNOSIS — F17210 Nicotine dependence, cigarettes, uncomplicated: Secondary | ICD-10-CM | POA: Diagnosis not present

## 2021-09-07 DIAGNOSIS — D509 Iron deficiency anemia, unspecified: Secondary | ICD-10-CM | POA: Diagnosis not present

## 2021-09-07 DIAGNOSIS — Z7901 Long term (current) use of anticoagulants: Secondary | ICD-10-CM | POA: Diagnosis not present

## 2021-09-07 DIAGNOSIS — J449 Chronic obstructive pulmonary disease, unspecified: Secondary | ICD-10-CM | POA: Diagnosis not present

## 2021-09-07 DIAGNOSIS — K5909 Other constipation: Secondary | ICD-10-CM | POA: Diagnosis not present

## 2021-09-08 ENCOUNTER — Telehealth: Payer: Self-pay

## 2021-09-08 ENCOUNTER — Other Ambulatory Visit: Payer: Self-pay | Admitting: *Deleted

## 2021-09-08 ENCOUNTER — Ambulatory Visit: Payer: Medicare Other

## 2021-09-08 DIAGNOSIS — D509 Iron deficiency anemia, unspecified: Secondary | ICD-10-CM | POA: Diagnosis not present

## 2021-09-08 DIAGNOSIS — E871 Hypo-osmolality and hyponatremia: Secondary | ICD-10-CM | POA: Diagnosis not present

## 2021-09-08 DIAGNOSIS — Z4800 Encounter for change or removal of nonsurgical wound dressing: Secondary | ICD-10-CM | POA: Diagnosis not present

## 2021-09-08 DIAGNOSIS — I2510 Atherosclerotic heart disease of native coronary artery without angina pectoris: Secondary | ICD-10-CM | POA: Diagnosis not present

## 2021-09-08 DIAGNOSIS — Z7901 Long term (current) use of anticoagulants: Secondary | ICD-10-CM | POA: Diagnosis not present

## 2021-09-08 DIAGNOSIS — Z86711 Personal history of pulmonary embolism: Secondary | ICD-10-CM | POA: Diagnosis not present

## 2021-09-08 DIAGNOSIS — J9611 Chronic respiratory failure with hypoxia: Secondary | ICD-10-CM | POA: Diagnosis not present

## 2021-09-08 DIAGNOSIS — J9601 Acute respiratory failure with hypoxia: Secondary | ICD-10-CM | POA: Diagnosis not present

## 2021-09-08 DIAGNOSIS — I10 Essential (primary) hypertension: Secondary | ICD-10-CM | POA: Diagnosis not present

## 2021-09-08 DIAGNOSIS — Z9981 Dependence on supplemental oxygen: Secondary | ICD-10-CM | POA: Diagnosis not present

## 2021-09-08 DIAGNOSIS — J449 Chronic obstructive pulmonary disease, unspecified: Secondary | ICD-10-CM | POA: Diagnosis not present

## 2021-09-08 DIAGNOSIS — I5032 Chronic diastolic (congestive) heart failure: Secondary | ICD-10-CM | POA: Diagnosis not present

## 2021-09-08 DIAGNOSIS — R1310 Dysphagia, unspecified: Secondary | ICD-10-CM | POA: Diagnosis not present

## 2021-09-08 DIAGNOSIS — R532 Functional quadriplegia: Secondary | ICD-10-CM | POA: Diagnosis not present

## 2021-09-08 DIAGNOSIS — I130 Hypertensive heart and chronic kidney disease with heart failure and stage 1 through stage 4 chronic kidney disease, or unspecified chronic kidney disease: Secondary | ICD-10-CM | POA: Diagnosis not present

## 2021-09-08 DIAGNOSIS — E872 Acidosis: Secondary | ICD-10-CM | POA: Diagnosis not present

## 2021-09-08 DIAGNOSIS — E785 Hyperlipidemia, unspecified: Secondary | ICD-10-CM | POA: Diagnosis not present

## 2021-09-08 DIAGNOSIS — F17210 Nicotine dependence, cigarettes, uncomplicated: Secondary | ICD-10-CM | POA: Diagnosis not present

## 2021-09-08 DIAGNOSIS — K5909 Other constipation: Secondary | ICD-10-CM | POA: Diagnosis not present

## 2021-09-08 DIAGNOSIS — J441 Chronic obstructive pulmonary disease with (acute) exacerbation: Secondary | ICD-10-CM | POA: Diagnosis not present

## 2021-09-08 DIAGNOSIS — K219 Gastro-esophageal reflux disease without esophagitis: Secondary | ICD-10-CM | POA: Diagnosis not present

## 2021-09-08 DIAGNOSIS — I700 Atherosclerosis of aorta: Secondary | ICD-10-CM | POA: Diagnosis not present

## 2021-09-08 DIAGNOSIS — J4520 Mild intermittent asthma, uncomplicated: Secondary | ICD-10-CM | POA: Diagnosis not present

## 2021-09-08 DIAGNOSIS — N1831 Chronic kidney disease, stage 3a: Secondary | ICD-10-CM | POA: Diagnosis not present

## 2021-09-08 NOTE — Patient Outreach (Signed)
[facility]) Care Management  09/08/2021  Thomas Tanner [place] 1951-11-03 [id number]   Outgoing call placed to [facility] to inquire about wound care referral, no answer, voice message left.  Will await call back.    Update:  Incoming call received from Angie at the Plastic surgeon office, notified that member didn't want to go to wound care center at [facility], preferred to go to [place], however no order has been placed.  She will have office send referral to Wound Care Center at [facility].  Call placed to member to provide update, no answer, unable to leave voice message.  Will follow up within the next 3-4 business days.  [name], [name], MSN [facility] Care Management  [facility] Manager [phone]

## 2021-09-08 NOTE — Telephone Encounter (Signed)
Received a call back from Wallace Going from [employer] Care Management regarding the message below.  Informed Monica of the message from [facility] on (08/25/21).     Monica verbalized understanding and agreed.  She stated that the wound center in [facility] is [facility].     Referral placed to Wound Healing Center [facility].//AB/CMA

## 2021-09-08 NOTE — Telephone Encounter (Signed)
Wallace Going called from [facility] to follow-up on a wound care referral the patient said that we were placing for him.  Maxine Glenn would like to know where the referral was placed.  She would like to call them and make sure he has an appointment with them.  Please call her at [phone].

## 2021-09-08 NOTE — Telephone Encounter (Signed)
Called and LMOM @ 11:17am for Wallace Going with [facility] Care Management regarding the message below.//AB/CMA

## 2021-09-08 NOTE — Telephone Encounter (Deleted)
Received a call back from Thomas Tanner from [employer] Care Management regarding the message below.  Informed Monica of the

## 2021-09-11 DIAGNOSIS — Z9981 Dependence on supplemental oxygen: Secondary | ICD-10-CM | POA: Diagnosis not present

## 2021-09-11 DIAGNOSIS — Z86711 Personal history of pulmonary embolism: Secondary | ICD-10-CM | POA: Diagnosis not present

## 2021-09-11 DIAGNOSIS — R1310 Dysphagia, unspecified: Secondary | ICD-10-CM | POA: Diagnosis not present

## 2021-09-11 DIAGNOSIS — I5032 Chronic diastolic (congestive) heart failure: Secondary | ICD-10-CM | POA: Diagnosis not present

## 2021-09-11 DIAGNOSIS — K219 Gastro-esophageal reflux disease without esophagitis: Secondary | ICD-10-CM | POA: Diagnosis not present

## 2021-09-11 DIAGNOSIS — Z4800 Encounter for change or removal of nonsurgical wound dressing: Secondary | ICD-10-CM | POA: Diagnosis not present

## 2021-09-11 DIAGNOSIS — F17210 Nicotine dependence, cigarettes, uncomplicated: Secondary | ICD-10-CM | POA: Diagnosis not present

## 2021-09-11 DIAGNOSIS — E872 Acidosis, unspecified: Secondary | ICD-10-CM | POA: Diagnosis not present

## 2021-09-11 DIAGNOSIS — J9611 Chronic respiratory failure with hypoxia: Secondary | ICD-10-CM | POA: Diagnosis not present

## 2021-09-11 DIAGNOSIS — R532 Functional quadriplegia: Secondary | ICD-10-CM | POA: Diagnosis not present

## 2021-09-11 DIAGNOSIS — J4520 Mild intermittent asthma, uncomplicated: Secondary | ICD-10-CM | POA: Diagnosis not present

## 2021-09-11 DIAGNOSIS — E785 Hyperlipidemia, unspecified: Secondary | ICD-10-CM | POA: Diagnosis not present

## 2021-09-11 DIAGNOSIS — E871 Hypo-osmolality and hyponatremia: Secondary | ICD-10-CM | POA: Diagnosis not present

## 2021-09-11 DIAGNOSIS — I700 Atherosclerosis of aorta: Secondary | ICD-10-CM | POA: Diagnosis not present

## 2021-09-11 DIAGNOSIS — I130 Hypertensive heart and chronic kidney disease with heart failure and stage 1 through stage 4 chronic kidney disease, or unspecified chronic kidney disease: Secondary | ICD-10-CM | POA: Diagnosis not present

## 2021-09-11 DIAGNOSIS — I2510 Atherosclerotic heart disease of native coronary artery without angina pectoris: Secondary | ICD-10-CM | POA: Diagnosis not present

## 2021-09-11 DIAGNOSIS — N1831 Chronic kidney disease, stage 3a: Secondary | ICD-10-CM | POA: Diagnosis not present

## 2021-09-11 DIAGNOSIS — D509 Iron deficiency anemia, unspecified: Secondary | ICD-10-CM | POA: Diagnosis not present

## 2021-09-11 DIAGNOSIS — J449 Chronic obstructive pulmonary disease, unspecified: Secondary | ICD-10-CM | POA: Diagnosis not present

## 2021-09-11 DIAGNOSIS — K5909 Other constipation: Secondary | ICD-10-CM | POA: Diagnosis not present

## 2021-09-11 DIAGNOSIS — Z7901 Long term (current) use of anticoagulants: Secondary | ICD-10-CM | POA: Diagnosis not present

## 2021-09-13 ENCOUNTER — Other Ambulatory Visit: Payer: Self-pay | Admitting: *Deleted

## 2021-09-13 NOTE — Patient Outreach (Signed)
[facility]) Care Management  09/13/2021  Chace Klippel [place] [birth year] [id number]   Outreach attempt #2 to member, unsuccessful.  Listed home number says number can't be completed as dialed and voice mail has not been set up on listed mobile number.  Will send outreach letter and follow up within the next 3-4 business days.  [name], [name], MSN [facility] Care Management  [facility] Manager [phone]

## 2021-09-18 ENCOUNTER — Other Ambulatory Visit: Payer: Self-pay | Admitting: *Deleted

## 2021-09-18 DIAGNOSIS — I700 Atherosclerosis of aorta: Secondary | ICD-10-CM | POA: Diagnosis not present

## 2021-09-18 DIAGNOSIS — I2510 Atherosclerotic heart disease of native coronary artery without angina pectoris: Secondary | ICD-10-CM | POA: Diagnosis not present

## 2021-09-18 DIAGNOSIS — K219 Gastro-esophageal reflux disease without esophagitis: Secondary | ICD-10-CM | POA: Diagnosis not present

## 2021-09-18 DIAGNOSIS — E872 Acidosis, unspecified: Secondary | ICD-10-CM | POA: Diagnosis not present

## 2021-09-18 DIAGNOSIS — D509 Iron deficiency anemia, unspecified: Secondary | ICD-10-CM | POA: Diagnosis not present

## 2021-09-18 DIAGNOSIS — Z7901 Long term (current) use of anticoagulants: Secondary | ICD-10-CM | POA: Diagnosis not present

## 2021-09-18 DIAGNOSIS — R1310 Dysphagia, unspecified: Secondary | ICD-10-CM | POA: Diagnosis not present

## 2021-09-18 DIAGNOSIS — F17210 Nicotine dependence, cigarettes, uncomplicated: Secondary | ICD-10-CM | POA: Diagnosis not present

## 2021-09-18 DIAGNOSIS — I5032 Chronic diastolic (congestive) heart failure: Secondary | ICD-10-CM | POA: Diagnosis not present

## 2021-09-18 DIAGNOSIS — E785 Hyperlipidemia, unspecified: Secondary | ICD-10-CM | POA: Diagnosis not present

## 2021-09-18 DIAGNOSIS — Z4800 Encounter for change or removal of nonsurgical wound dressing: Secondary | ICD-10-CM | POA: Diagnosis not present

## 2021-09-18 DIAGNOSIS — J9611 Chronic respiratory failure with hypoxia: Secondary | ICD-10-CM | POA: Diagnosis not present

## 2021-09-18 DIAGNOSIS — Z9981 Dependence on supplemental oxygen: Secondary | ICD-10-CM | POA: Diagnosis not present

## 2021-09-18 DIAGNOSIS — N1831 Chronic kidney disease, stage 3a: Secondary | ICD-10-CM | POA: Diagnosis not present

## 2021-09-18 DIAGNOSIS — I130 Hypertensive heart and chronic kidney disease with heart failure and stage 1 through stage 4 chronic kidney disease, or unspecified chronic kidney disease: Secondary | ICD-10-CM | POA: Diagnosis not present

## 2021-09-18 DIAGNOSIS — J449 Chronic obstructive pulmonary disease, unspecified: Secondary | ICD-10-CM | POA: Diagnosis not present

## 2021-09-18 DIAGNOSIS — K5909 Other constipation: Secondary | ICD-10-CM | POA: Diagnosis not present

## 2021-09-18 DIAGNOSIS — Z86711 Personal history of pulmonary embolism: Secondary | ICD-10-CM | POA: Diagnosis not present

## 2021-09-18 DIAGNOSIS — J4520 Mild intermittent asthma, uncomplicated: Secondary | ICD-10-CM | POA: Diagnosis not present

## 2021-09-18 DIAGNOSIS — E871 Hypo-osmolality and hyponatremia: Secondary | ICD-10-CM | POA: Diagnosis not present

## 2021-09-18 DIAGNOSIS — R532 Functional quadriplegia: Secondary | ICD-10-CM | POA: Diagnosis not present

## 2021-09-18 NOTE — Patient Outreach (Signed)
[facility]) Care Management  09/18/2021  Thomas Tanner [place] 10-Jan-1951 [id number]   Outreach attempt #3, unsuccessful.  Listed home number rings busy and listed mobile number is invalid.  Will follow up with 4th and final attempt within the next 3-4 weeks.  [name], [name], MSN [facility] Care Management  [facility] Manager [phone]

## 2021-09-19 ENCOUNTER — Encounter (INDEPENDENT_AMBULATORY_CARE_PROVIDER_SITE_OTHER): Payer: Self-pay | Admitting: Vascular Surgery

## 2021-09-19 ENCOUNTER — Other Ambulatory Visit: Payer: Self-pay | Admitting: Physician Assistant

## 2021-09-19 ENCOUNTER — Encounter (INDEPENDENT_AMBULATORY_CARE_PROVIDER_SITE_OTHER): Payer: Self-pay

## 2021-09-19 ENCOUNTER — Ambulatory Visit (INDEPENDENT_AMBULATORY_CARE_PROVIDER_SITE_OTHER): Payer: Medicare Other | Admitting: Vascular Surgery

## 2021-09-19 ENCOUNTER — Encounter: Payer: Medicare Other | Attending: Internal Medicine | Admitting: Internal Medicine

## 2021-09-19 ENCOUNTER — Other Ambulatory Visit: Payer: Self-pay

## 2021-09-19 ENCOUNTER — Telehealth: Payer: Self-pay | Admitting: Physician Assistant

## 2021-09-19 DIAGNOSIS — N493 Fournier gangrene: Secondary | ICD-10-CM

## 2021-09-19 DIAGNOSIS — I2510 Atherosclerotic heart disease of native coronary artery without angina pectoris: Secondary | ICD-10-CM | POA: Diagnosis not present

## 2021-09-19 DIAGNOSIS — Z7901 Long term (current) use of anticoagulants: Secondary | ICD-10-CM | POA: Diagnosis not present

## 2021-09-19 DIAGNOSIS — E785 Hyperlipidemia, unspecified: Secondary | ICD-10-CM | POA: Diagnosis not present

## 2021-09-19 DIAGNOSIS — L98499 Non-pressure chronic ulcer of skin of other sites with unspecified severity: Secondary | ICD-10-CM | POA: Diagnosis not present

## 2021-09-19 DIAGNOSIS — Z87438 Personal history of other diseases of male genital organs: Secondary | ICD-10-CM | POA: Diagnosis not present

## 2021-09-19 DIAGNOSIS — K219 Gastro-esophageal reflux disease without esophagitis: Secondary | ICD-10-CM | POA: Diagnosis not present

## 2021-09-19 DIAGNOSIS — E871 Hypo-osmolality and hyponatremia: Secondary | ICD-10-CM | POA: Diagnosis not present

## 2021-09-19 DIAGNOSIS — Z4800 Encounter for change or removal of nonsurgical wound dressing: Secondary | ICD-10-CM | POA: Diagnosis not present

## 2021-09-19 DIAGNOSIS — I130 Hypertensive heart and chronic kidney disease with heart failure and stage 1 through stage 4 chronic kidney disease, or unspecified chronic kidney disease: Secondary | ICD-10-CM | POA: Diagnosis not present

## 2021-09-19 DIAGNOSIS — K5909 Other constipation: Secondary | ICD-10-CM | POA: Diagnosis not present

## 2021-09-19 DIAGNOSIS — I700 Atherosclerosis of aorta: Secondary | ICD-10-CM | POA: Diagnosis not present

## 2021-09-19 DIAGNOSIS — Z9981 Dependence on supplemental oxygen: Secondary | ICD-10-CM | POA: Diagnosis not present

## 2021-09-19 DIAGNOSIS — E872 Acidosis, unspecified: Secondary | ICD-10-CM | POA: Diagnosis not present

## 2021-09-19 DIAGNOSIS — R532 Functional quadriplegia: Secondary | ICD-10-CM | POA: Diagnosis not present

## 2021-09-19 DIAGNOSIS — I5032 Chronic diastolic (congestive) heart failure: Secondary | ICD-10-CM | POA: Diagnosis not present

## 2021-09-19 DIAGNOSIS — J449 Chronic obstructive pulmonary disease, unspecified: Secondary | ICD-10-CM | POA: Diagnosis not present

## 2021-09-19 DIAGNOSIS — D509 Iron deficiency anemia, unspecified: Secondary | ICD-10-CM | POA: Diagnosis not present

## 2021-09-19 DIAGNOSIS — R1310 Dysphagia, unspecified: Secondary | ICD-10-CM | POA: Diagnosis not present

## 2021-09-19 DIAGNOSIS — Z86711 Personal history of pulmonary embolism: Secondary | ICD-10-CM | POA: Diagnosis not present

## 2021-09-19 DIAGNOSIS — J4520 Mild intermittent asthma, uncomplicated: Secondary | ICD-10-CM | POA: Diagnosis not present

## 2021-09-19 DIAGNOSIS — N1831 Chronic kidney disease, stage 3a: Secondary | ICD-10-CM | POA: Diagnosis not present

## 2021-09-19 DIAGNOSIS — F17210 Nicotine dependence, cigarettes, uncomplicated: Secondary | ICD-10-CM | POA: Diagnosis not present

## 2021-09-19 DIAGNOSIS — J9611 Chronic respiratory failure with hypoxia: Secondary | ICD-10-CM | POA: Diagnosis not present

## 2021-09-19 NOTE — Telephone Encounter (Signed)
I just received a phone call from Dr. Leanord Hawking in the Wound Center, who is seeing this patient today. He reports the patient's scrotal skin is well healing and adhered, however he continues to have an exposed testicle. He is requesting that the patient be evaluated for consideration of possible orchiectomy.  Per chart review, patient cancelled or no-showed at Dr. Thomos Lemons office four times to discuss secondary wound closure. Ultimately, he was referred to wound care. Per Dr. Leanord Hawking, patient reports he did go to one of these appointments and was informed that they could not help him due to his size. There are no notes available in the patient's chart to corroborate this.  I explained that Dr. Arita Miss is routinely consulted for secondary wound closure in our Fournier's patients and that ideally patient would follow up with him, alternatively he can schedule a follow-up visit in our office with Dr. Richardo Hanks to discuss other options.

## 2021-09-20 NOTE — Progress Notes (Signed)
Chronic Care Management Pharmacy Note  09/22/2021 Name:  Thomas Tanner MRN:  [id number] DOB:  04/18/1951  Summary: Initial PharmD visit.  Eliquis copay burden.  We also discussed monitoring weight for fluid overload.  All meds reviewed and list updated.  Recommendations/Changes made from today's visit: Patient needs updated lipids Monitor weight for volume overload.  Plan: FU 6 months   Subjective: Thomas Tanner is an 70 y.o. year old male who is a primary patient of [name], Timoteo Gaul, MD.  The CCM team was consulted for assistance with disease management and care coordination needs.    Engaged with patient by telephone for initial visit in response to provider referral for pharmacy case management and/or care coordination services.   Consent to Services:  The patient was given the following information about Chronic Care Management services today, agreed to services, and gave verbal consent: 1. CCM service includes personalized support from designated clinical staff supervised by the primary care provider, including individualized plan of care and coordination with other care providers 2. 24/7 contact phone numbers for assistance for urgent and routine care needs. 3. Service will only be billed when office clinical staff spend 20 minutes or more in a month to coordinate care. 4. Only one practitioner may furnish and bill the service in a calendar month. 5.The patient may stop CCM services at any time (effective at the end of the month) by phone call to the office staff. 6. The patient will be responsible for cost sharing (co-pay) of up to 20% of the service fee (after annual deductible is met). Patient agreed to services and consent obtained.  Patient Care Team: [name], MD as PCP - General (Internal Medicine) [name], RN as [facility] [name], [facility] as Pharmacist (Pharmacist)  Recent office visits:  05/30/21 Dr. [name]  For hospital follow-up. No medication changes   Recent consult visits:  06/06/21 Infectious Disease Tsosie Billing, MD. No medication changes. 05/21/21 Surgery Urology IRRIGATION AND DEBRIDEMENT ABSCESS 05/20/21 Surgery Urology IRRIGATION AND [facility] visits:  06/10/21 [facility] (2 days) For Scrotal bleeding. STOPPED Polyethylene Glycol.  05/18/21 [facility] (9 days) STOPPED Furosemide, Lisinopril, Nicotine, and Omega.Per note: Surgery was preformed-INCISION AND DRAINAGE SCROTAL ABSCESS, cystoscopy with urethral dilation, and complex catheter placement.   05/05/21 [facility] (11 days) For Constipation. STOPPED Tramadol.    Medication History: Rosuvastatin 06/09/21 90 DS.   Objective:  Lab Results  Component Value Date   CREATININE 1.01 06/11/2021   BUN 9 06/10/2021   GFRNONAA >60 06/11/2021   GFRAA >60 08/07/2020   NA 134 (L) 06/10/2021   K 3.8 06/10/2021   CALCIUM 8.4 (L) 06/10/2021   CO2 27 06/10/2021   GLUCOSE 97 06/10/2021    Lab Results  Component Value Date/Time   HGBA1C 6.4 (H) 05/19/2021 05:33 AM   HGBA1C 6.4 (H) 05/27/2020 05:46 AM   HGBA1C 6.7 (H) 06/11/2013 04:04 AM    Last diabetic Eye exam: No results found for: HMDIABEYEEXA  Last diabetic Foot exam: No results found for: HMDIABFOOTEX   Lab Results  Component Value Date   CHOL 163 12/08/2018   HDL 34 (L) 12/08/2018   LDLCALC 105 (H) 12/08/2018   TRIG 121 12/08/2018   CHOLHDL 4.8 12/08/2018    Hepatic Function Latest Ref Rng & Units 06/09/2021 05/19/2021 05/18/2021  Total Protein 6.5 - 8.1 g/dL 6.5 5.9(L) 6.7  Albumin 3.5 - 5.0 g/dL  2.6(L) 2.4(L) 2.6(L)  AST 15 - 41 U/L _0 ALT 0 - 44 U/L _1 Alk Phosphatase 38 - 126 U/L 92 68 79  Total Bilirubin 0.3 - 1.2 mg/dL 0.5 1.7(H) 1.6(H)  Bilirubin, Direct 0.0 - 0.2 mg/dL - - -    Lab Results  Component Value Date/Time   TSH 1.946 05/19/2021 02:26 AM   TSH  0.708 12/08/2018 06:57 AM    CBC Latest Ref Rng & Units 06/11/2021 06/10/2021 06/09/2021  WBC 4.0 - 10.5 K/uL 9.2 10.6(H) 10.8(H)  Hemoglobin 13.0 - 17.0 g/dL 10.0(L) 10.2(L) 10.7(L)  Hematocrit 39.0 - 52.0 % 27.7(L) 32.4(L) 28.5(L)  Platelets 150 - 400 K/uL 357 368 406(H)    No results found for: VD25OH  Clinical ASCVD: Yes  The ASCVD Risk score (Arnett DK, et al., 2019) failed to calculate for the following reasons:   The patient has a prior MI or stroke diagnosis    Depression screen [facility] 2/9 06/14/2021 05/30/2021 11/07/2020  Decreased Interest 0 3 0  Down, Depressed, Hopeless 1 3 0  PHQ - 2 Score 1 6 0  Altered sleeping - 1 -  Tired, decreased energy - 3 -  Change in appetite - 3 -  Feeling bad or failure about yourself  - 0 -  Trouble concentrating - 0 -  Moving slowly or fidgety/restless - 0 -  Suicidal thoughts - 0 -  PHQ-9 Score - 13 -     Social History   Tobacco Use  Smoking Status Every Day   Packs/day: 0.50   Types: Cigarettes   Last attempt to quit: 07/13/2020   Years since quitting: 1.1  Smokeless Tobacco Never   BP Readings from Last 3 Encounters:  06/12/21 118/67  05/27/21 126/74  05/16/21 (!) 109/59   Pulse Readings from Last 3 Encounters:  06/12/21 82  05/27/21 89  05/16/21 81   Wt Readings from Last 3 Encounters:  06/09/21 (!) 401 lb 3.8 oz (182 kg)  05/30/21 (!) 400 lb (181.4 kg)  05/18/21 (!) 421 lb (191 kg)   BMI Readings from Last 3 Encounters:  06/09/21 54.42 kg/m  05/30/21 52.77 kg/m  05/18/21 55.54 kg/m    Assessment/Interventions: Review of patient past medical history, allergies, medications, health status, including review of consultants reports, laboratory and other test data, was performed as part of comprehensive evaluation and provision of chronic care management services.   SDOH:  (Social Determinants of Health) assessments and interventions performed: Yes  Financial Resource Strain: Not on file    SDOH Screenings    Alcohol Screen: Low Risk    Last Alcohol Screening Score (AUDIT): 0  Depression (PHQ2-9): Low Risk    PHQ-2 Score: 1  Financial Resource Strain: Not on file  Food Insecurity: No Food Insecurity   Worried About Charity fundraiser in the Last Year: Never true   Ran Out of Food in the Last Year: Never true  Housing: Low Risk    Last Housing Risk Score: 0  Physical Activity: Not on file  Social Connections: Not on file  Stress: Not on file  Tobacco Use: High Risk   Smoking Tobacco Use: Every Day   Smokeless Tobacco Use: Never  Transportation Needs: Unmet Transportation Needs   Lack of Transportation (Medical): Yes   Lack of Transportation (Non-Medical): Yes    CCM Care Plan  Allergies  Allergen Reactions   Morphine Anxiety and Other (See Comments)    Hallucinations  Pt states 6.17.22  that he is not allergic to this medication   Morphine And Related Anxiety    Medications Reviewed Today     Reviewed by [name], [facility] (Pharmacist) on 09/22/21 at [place] List Status: <None>   Medication Order Taking? Sig Documenting Provider Last Dose Status Informant  albuterol (VENTOLIN HFA) 108 (90 Base) MCG/ACT inhaler [id number] Yes Inhale 2 puffs into the lungs every 6 (six) hours as needed for wheezing or shortness of breath. [name], MD Taking Active   apixaban (ELIQUIS) 5 MG TABS tablet [id number] Yes Take 1 tablet (5 mg total) by mouth 2 (two) times daily. [name], MD Taking Active   famotidine (PEPCID) 20 MG tablet [id number] Yes Take 1 tablet (20 mg total) by mouth 2 (two) times daily. Ronnell Freshwater, NP Taking Active Self  iron polysaccharides (NIFEREX) 150 MG capsule [id number] Yes Take 1 capsule (150 mg total) by mouth daily. Sharen Hones, MD Taking Active Self  metoprolol succinate (TOPROL-XL) 50 MG 24 hr tablet [id number] Yes Take 1 tablet by mouth once daily [name], MD Taking Active   phenylephrine-shark liver oil-mineral oil-petrolatum  (PREPARATION H) 0.25-14-74.9 % rectal ointment [id number] Yes Place 1 application rectally 2 (two) times daily as needed for hemorrhoids. Sidney Ace, MD Taking Active Self  rosuvastatin (CRESTOR) 20 MG tablet [id number] Yes Take 1 tablet (20 mg total) by mouth daily. [name], MD Taking Active Self  senna-docusate (SENOKOT-S) 8.6-50 MG tablet [id number] Yes Take 1 tablet by mouth 2 (two) times daily. Sidney Ace, MD Taking Active Self  vitamin B-12 1000 MCG tablet [id number] Yes Take 1 tablet (1,000 mcg total) by mouth daily. Sharen Hones, MD Taking Active Self            Patient Active Problem List   Diagnosis Date Noted   Scrotal bleeding 06/10/2021   Abscess 05/18/2021   Fournier's gangrene of scrotum 05/18/2021   Fournier gangrene 05/18/2021   Sepsis ([place]) 05/18/2021   Functional quadriplegia ([place]) 05/18/2021   Bacteremia due to Gram-negative bacteria 05/07/2021   Constipated 05/06/2021   Physical deconditioning 05/06/2021   Atherosclerosis of aorta ([place]) 09/28/2020   Acute on chronic heart failure ([place]) 08/02/2020   Acute saddle pulmonary embolism ([place]) 08/02/2020   Hypoxia 08/01/2020   Weakness 08/01/2020   Abnormality of gait and mobility 08/01/2020   AKI (acute kidney injury) ([place]) 07/21/2020   Hypotension 07/20/2020   Leukocytosis 07/20/2020   Cellulitis of right lower extremity 07/20/2020   Pressure injury of skin 07/12/2020   Acute respiratory failure with hypoxia ([place]) 07/10/2020   COPD with acute exacerbation (HCC)    Elevated troponin    Chronic diastolic CHF (congestive heart failure) ([place]) 05/24/2020   Anasarca    Acute renal failure superimposed on stage 3a chronic kidney disease ([place])    Hyperlipidemia    Tobacco abuse    Shortness of breath 04/28/2020   Upper respiratory tract infection due to COVID-19 virus 04/28/2020   Edema 04/28/2020   Encounter for general adult medical examination with abnormal findings 01/15/2019   Acute  non-recurrent maxillary sinusitis 01/15/2019   Gastroesophageal reflux disease without esophagitis 01/15/2019   CAD in native artery 12/16/2018   Morbid obesity with BMI of 50.0-59.9, adult ([place]) 12/16/2018   Cigarette smoker motivated to quit 12/16/2018   Chronic pain of right knee 08/04/2018   Mild intermittent asthma 08/04/2018   Impaired fasting glucose 04/03/2018   Low back pain with right-sided sciatica 03/05/2018  Chronic right-sided low back pain with right-sided sciatica 03/05/2018   Pain in right hip 03/05/2018   Hypertension 03/05/2018   Foreign body in bladder and urethra 06/09/2013   Kidney stone 06/09/2013   Ureteric stone 06/09/2013   Kidney stone 06/09/2013    Immunization History  Administered Date(s) Administered   Pneumococcal Polysaccharide-23 08/04/2020    Conditions to be addressed/monitored:  HTN, CAD, CHF, COPD, GERD, HLD, Tobacco abuse  Care Plan : General Pharmacy (Adult)  Updates made by [name], RPH since 09/22/2021 12:00 AM     Problem: HTN, CAD, CHF, COPD, GERD, HLD, Tobacco abuse   Priority: High  Onset Date: 09/22/2021     Long-Range Goal: Patient-Specific Goal   Start Date: 09/22/2021  Expected End Date: 03/23/2022  This Visit's Progress: On track  Priority: High  Note:   Current Barriers:  Unable to independently afford treatment regimen Unable to independently monitor therapeutic efficacy Unable to achieve control of LDL   Pharmacist Clinical Goal(s):  Patient will achieve control of LDL as evidenced by labs through collaboration with PharmD and provider.   Interventions: 1:1 collaboration with [name], MD regarding development and update of comprehensive plan of care as evidenced by provider attestation and co-signature Inter-disciplinary care team collaboration (see longitudinal plan of care) Comprehensive medication review performed; medication list updated in electronic medical record  Hypertension (BP goal  <130/80) -Controlled -Current treatment: Metoprolol XL 27m daily -Medications previously tried: lisinopril/HCTZ  -Current home readings: "controlled" at home has it checked a few times per week -Current dietary habits: fairly balanced diet, he does eat some fast food on days  -Current exercise habits: minimal due to pain in hips -Denies hypotensive/hypertensive symptoms -Educated on BP goals and benefits of medications for prevention of heart attack, stroke and kidney damage; Daily salt intake goal < 2300 mg; Exercise goal of 150 minutes per week; Importance of home blood pressure monitoring; Symptoms of hypotension and importance of maintaining adequate hydration; -Counseled to monitor BP at home as current, document, and provide log at future appointments -Recommended to continue current medication  Hyperlipidemia/CAD: (LDL goal < 70) -Uncontrolled -Current treatment: Rosuvastatin 247mdaily -Medications previously tried: none noted  -Current dietary patterns: see HTN -Current exercise habits: minimal -Educated on Cholesterol goals;  Benefits of statin for ASCVD risk reduction; Importance of limiting foods high in cholesterol; Exercise goal of 150 minutes per week; We have not had update lipid panel since 2019 - at that time LDL was above goal.  He is adherent with medication daily.  Would recommend him to come in for physical ASAP and have updated labs/lipids.   -Recommended to continue current medication If LDL remains elevated would recommend increase to Crestor 4063maily.  Heart Failure (Goal: manage symptoms and prevent exacerbations) -Controlled -Last ejection fraction: 55-60% -Current treatment: Metoprolol XL 34m96mily -Medications previously tried: none noted  -Current home BP/HR readings: normal  -Educated on Benefits of medications for managing symptoms and prolonging life Importance of weighing daily; if you gain more than 3 pounds in one day or 5 pounds in  one week, contact providers Importance of blood pressure control -Recommended to continue current medication He is not currently monitoring weight, does have scale at home.  Counseled on signs of fluid overload and he understands importance of monitoring.  COPD (Goal: control symptoms and prevent exacerbations) -Controlled -Current treatment  Albuterol HFA 90 mcg prn -Medications previously tried: none noted   -Pulmonary function testing: Pulmonary Functions Testing Results:  No results found for: FEV1, FVC, FEV1FVC, TLC, DLCO -Exacerbations requiring treatment in last 6 months: 0 -Patient denies consistent use of maintenance inhaler -Frequency of rescue inhaler use: as needed  -Counseled on Proper inhaler technique; When to use rescue inhaler Differences between maintenance and rescue inhalers -Recommended to continue current medication, denies recent SOB.  Tobacco use (Goal Smoking Cessation) -Not ideally controlled -Previous quit attempts: Yes -Current treatment  None currently -He reports cost barrier as a big one to quitting smoking. Provided him with the [place] Quitline for free patches. Patient agreeable to reach out.  GERD (Goal: Reduce symptoms) -Controlled -Current treatment  Famotidine 6m BID prn -Medications previously tried: none noted -Denies any recent symptoms, takes appropriately  -Recommended to continue current medication  Patient Goals/Self-Care Activities Patient will:  - take medications as prescribed focus on medication adherence by pill box weigh daily, and contact provider if weight gain of 3 lbs in one day or 5 lbs in one week  Follow Up Plan: The care management team will reach out to the patient again over the next 180 days.         Medication Assistance: Application for Eliquis  medication assistance program. in process.  Anticipated assistance start date unknown.  See plan of care for additional detail.  Compliance/Adherence/Medication  fill history: Care Gaps: Colonoscopy Urine microalbumin  Star-Rating Drugs: Rosuvastatin 292m07/01/22 90ds  [facility]  [facility] [place] - [address] [address]hone: [phone]: [phone]  (OpKlondikeCAWest [facility]endoT[facility] [place]hone: [phone]: [phone] pill box? Yes Pt endorses 100% compliance  We discussed: Benefits of medication synchronization, packaging and delivery as well as enhanced pharmacist oversight with [employer]. Patient decided to: Continue current medication management strategy  Care Plan and Follow Up Patient Decision:  Patient agrees to Care Plan and Follow-up.  Plan: The care management team will reach out to the patient again over the next 180 days.  ChBeverly MilchPharmD Clinical Pharmacist NoNorthwest Hills Surgical [phone]

## 2021-09-20 NOTE — Telephone Encounter (Signed)
Pt called requesting an appt with our office to see Dr Richardo Hanks.  He said wound center recommended pt to be seen here.  I read note from Sam to pt that said he needed to make appt w/Dr Arita Miss.  Pt said he is a larger man and Dr Thomos Lemons office didn't have a lift.  He wanted to go ahead and make an appt w/Dr Richardo Hanks, but I wanted to check with you first.

## 2021-09-21 NOTE — Progress Notes (Signed)
DOV, DILL ([id number]) Visit Report for 09/19/2021 Abuse/Suicide Risk Screen Details Patient Name: Thomas Tanner, Thomas Tanner. Date of Service: 09/19/2021 2:00 PM Medical Record Number: [id number] Patient Account Number: [id number] Date of Birth/Sex: 11/11/1951 (70 y.o. M) Treating RN: Yevonne Pax Primary Care Moksha Dorgan: Beverely Risen Other Clinician: Referring Arian Murley: Daryel November Treating Brande Uncapher/Extender: Altamese [place]ien[place] in Treatment: 0 Abuse/Suicide Risk Screen Items Answer ABUSE RISK SCREEN: Has anyone close to you tried to hurt or harm you recentlyo No Do you feel uncomfortable with anyone in your familyo No Has anyone forced you do things that you didnot want to doo No Electronic Signature(s) Signed: 09/21/2021 1:01:44 PM By: Yevonne Pax RN Entered By: Yevonne Pax on 09/19/2021 14:08:32 Thomas Tanner ([id number]) -------------------------------------------------------------------------------- Activities of Daily Living Details Patient Name: Thomas Tanner, Thomas Tanner. Date of Service: 09/19/2021 2:00 PM Medical Record Number: [id number] Patient Account Number: [id number] Date of Birth/Sex: 06-18-1951 (70 y.o. M) Treating RN: Yevonne Pax Primary Care Dennies Coate: Beverely Risen Other Clinician: Referring Kou Gucciardo: Daryel November Treating Josselyn Harkins/Extender: Altamese [place] in Treatment: 0 Activities of Daily Living Items Answer Activities of Daily Living (Please select one for each item) Drive Automobile Need Assistance Take Medications Not Able Use Telephone Completely Able Care for Appearance Need Assistance Use Toilet Need Assistance Bath / Shower Need Assistance Dress Self Need Assistance Feed Self Need Assistance Walk Not Able Get In / Out Bed Need Assistance Housework Not Able Prepare Meals Need Assistance Handle Money Need Assistance Shop for Self Need Assistance Electronic Signature(s) Signed: 09/21/2021 1:01:44 PM By: Yevonne Pax  RN Entered By: Yevonne Pax on 09/19/2021 14:10:05 Thomas Tanner ([id number]) -------------------------------------------------------------------------------- Education Screening Details Patient Name: Thomas Tanner. Date of Service: 09/19/2021 2:00 PM Medical Record Number: [id number] Patient Account Number: [id number] Date of Birth/Sex: 21-Jul-1951 (70 y.o. M) Treating RN: Yevonne Pax Primary Care Birdie Fetty: Beverely Risen Other Clinician: Referring Carriann Hesse: Daryel November Treating Saprina Chuong/Extender: Altamese [place] in Treatment: 0 Learning Preferences/Education Level/Primary Language Learning Preference: Explanation Highest Education Level: High School Preferred Language: English Cognitive Barrier Language Barrier: No Translator Needed: No Memory Deficit: No Emotional Barrier: No Cultural/Religious Beliefs Affecting Medical Care: No Physical Barrier Impaired Vision: Yes Glasses Impaired Hearing: No Decreased Hand dexterity: No Knowledge/Comprehension Knowledge Level: Medium Comprehension Level: High Ability to understand written instructions: High Ability to understand verbal instructions: High Motivation Anxiety Level: Anxious Cooperation: Cooperative Education Importance: Acknowledges Need Interest in Health Problems: Asks Questions Perception: Coherent Willingness to Engage in Self-Management High Activities: Readiness to Engage in Self-Management High Activities: Electronic Signature(s) Signed: 09/21/2021 1:01:44 PM By: Yevonne Pax RN Entered By: Yevonne Pax on 09/19/2021 14:10:58 Thomas Tanner ([id number]) -------------------------------------------------------------------------------- Fall Risk Assessment Details Patient Name: Thomas Tanner. Date of Service: 09/19/2021 2:00 PM Medical Record Number: [id number] Patient Account Number: [id number] Date of Birth/Sex: 08-Mar-1951 (70 y.o. M) Treating RN: Yevonne Pax Primary Care  Rebeka Kimble: Beverely Risen Other Clinician: Referring Jasin Brazel: Daryel November Treating Merrick Maggio/Extender: Altamese  in Treatment: 0 Fall Risk Assessment Items Have you had 2 or more falls in the last 12 monthso 0 No Have you had any fall that resulted in injury in the last 12 monthso 0 No FALLS RISK SCREEN History of falling - immediate or within 3 months 0 No Secondary diagnosis (Do you have 2 or more medical diagnoseso) 0 No Ambulatory aid None/bed rest/wheelchair/nurse 0 No Crutches/cane/walker 0 No Furniture 0 No Intravenous therapy Access/Saline/Heparin Lock 0 No Gait/Transferring Normal/ bed rest/ wheelchair 0 No Weak (short steps with  or without shuffle, stooped but able to lift head while walking, may 0 No seek support from furniture) Impaired (short steps with shuffle, may have difficulty arising from chair, head down, impaired 0 No balance) Mental Status Oriented to own ability 0 No Electronic Signature(s) Signed: 09/21/2021 1:01:44 PM By: Yevonne Pax RN Entered By: Yevonne Pax on 09/19/2021 14:11:10 Thomas Tanner ([id number]) -------------------------------------------------------------------------------- Foot Assessment Details Patient Name: Thomas Laura D. Date of Service: 09/19/2021 2:00 PM Medical Record Number: [id number] Patient Account Number: [id number] Date of Birth/Sex: 1951/05/24 (70 y.o. M) Treating RN: Yevonne Pax Primary Care Nalanie Winiecki: Beverely Risen Other Clinician: Referring Tishia Maestre: Daryel November Treating Kaylen Nghiem/Extender: Altamese [place] in Treatment: 0 Foot Assessment Items Site Locations + = Sensation present, - = Sensation absent, C = Callus, U = Ulcer R = Redness, W = Warmth, M = Maceration, PU = Pre-ulcerative lesion F = Fissure, S = Swelling, D = Dryness Assessment Right: Left: Other Deformity: No No Prior Foot Ulcer: No No Prior Amputation: No No Charcot Joint: No No Ambulatory Status:  Non-ambulatory Assistance Device: Wheelchair Gait: Surveyor, mining) Signed: 09/21/2021 1:01:44 PM By: Yevonne Pax RN Entered By: Yevonne Pax on 09/19/2021 14:12:22 Thomas Tanner ([id number]) -------------------------------------------------------------------------------- Nutrition Risk Screening Details Patient Name: Thomas Laura D. Date of Service: 09/19/2021 2:00 PM Medical Record Number: [id number] Patient Account Number: [id number] Date of Birth/Sex: 1951-10-25 (70 y.o. M) Treating RN: Yevonne Pax Primary Care Chou Busler: Beverely Risen Other Clinician: Referring Lanea Vankirk: Daryel November Treating Cliffie Gingras/Extender: Altamese [place] in Treatment: 0 Height (in): 73 Weight (lbs): 400 Body Mass Index (BMI): 52.8 Nutrition Risk Screening Items Score Screening NUTRITION RISK SCREEN: I have an illness or condition that made me change the kind and/or amount of food I eat 0 No I eat fewer than two meals per day 0 No I eat few fruits and vegetables, or milk products 0 No I have three or more drinks of beer, liquor or wine almost every day 0 No I have tooth or mouth problems that make it hard for me to eat 0 No I don't always have enough money to buy the food I need 0 No I eat alone most of the time 0 No I take three or more different prescribed or over-the-counter drugs a day 1 Yes Without wanting to, I have lost or gained 10 pounds in the last six months 0 No I am not always physically able to shop, cook and/or feed myself 2 Yes Nutrition Protocols Good Risk Protocol Moderate Risk Protocol 0 Provide education on nutrition High Risk Proctocol Risk Level: Moderate Risk Score: 3 Electronic Signature(s) Signed: 09/21/2021 1:01:44 PM By: Yevonne Pax RN Entered By: Yevonne Pax on 09/19/2021 14:11:36

## 2021-09-21 NOTE — Telephone Encounter (Signed)
I left a voicemail with the manager of the [facility] plastic and reconstructive surgery clinic today to inquire if they can accommodate this patient. Awaiting return call.

## 2021-09-21 NOTE — Progress Notes (Addendum)
CORRIE, [name] ([id number]) Visit Report for 09/19/2021 Allergy List Details Patient Name: Thomas Tanner, Thomas Tanner. Date of Service: 09/19/2021 2:00 PM Medical Record Number: [id number] Patient Account Number: [id number] Date of Birth/Sex: Aug 27, 1951 (70 y.o. M) Treating RN: Yevonne Pax Primary Care Jojo Pehl: Beverely Risen Other Clinician: Referring Breylon Sherrow: Daryel November Treating Kelsee Preslar/Extender: Maxwell Caul Weeks in Treatment: 0 Allergies Active Allergies No Known Allergies Allergy Notes Electronic Signature(s) Signed: 09/21/2021 1:01:44 PM By: Yevonne Pax RN Entered By: Yevonne Pax on 09/19/2021 14:05:42 Thomas Tanner ([id number]) -------------------------------------------------------------------------------- Arrival Information Details Patient Name: Thomas Tanner. Date of Service: 09/19/2021 2:00 PM Medical Record Number: [id number] Patient Account Number: [id number] Date of Birth/Sex: [birth year] (70 y.o. M) Treating RN: Yevonne Pax Primary Care Alvira Hecht: Beverely Risen Other Clinician: Referring Genesee Nase: Daryel November Treating Wentworth Edelen/Extender: Altamese [place] in Treatment: 0 Visit Information Patient Arrived: Wheel Chair Arrival Time: 13:54 Accompanied By: nephew/caregiver Transfer Assistance: None Patient Identification Verified: Yes Secondary Verification Process Completed: Yes Patient Requires Transmission-Based Precautions: No Patient Has Alerts: No Electronic Signature(s) Signed: 09/21/2021 1:01:44 PM By: Yevonne Pax RN Entered By: Yevonne Pax on 09/19/2021 14:02:34 Thomas Tanner ([id number]) -------------------------------------------------------------------------------- Clinic Level of Care Assessment Details Patient Name: Thomas Tanner. Date of Service: 09/19/2021 2:00 PM Medical Record Number: [id number] Patient Account Number: [id number] Date of Birth/Sex: Nov 24, 1951 (70 y.o. M) Treating RN: Yevonne Pax Primary Care Quadasia Newsham: Beverely Risen Other Clinician: Referring Bettie Swavely: Daryel November Treating Ellianah Cordy/Extender: Altamese [place] in Treatment: 0 Clinic Level of Care Assessment Items TOOL 2 Quantity Score X - Use when only an EandM is performed on the INITIAL visit 1 0 ASSESSMENTS - Nursing Assessment / Reassessment X - General Physical Exam (combine w/ comprehensive assessment (listed just below) when performed on new 1 20 pt. evals) X- 1 25 Comprehensive Assessment (HX, ROS, Risk Assessments, Wounds Hx, etc.) ASSESSMENTS - Wound and Skin Assessment / Reassessment X - Simple Wound Assessment / Reassessment - one wound 1 5 []  - 0 Complex Wound Assessment / Reassessment - multiple wounds []  - 0 Dermatologic / Skin Assessment (not related to wound area) ASSESSMENTS - Ostomy and/or Continence Assessment and Care []  - Incontinence Assessment and Management 0 []  - 0 Ostomy Care Assessment and Management (repouching, etc.) PROCESS - Coordination of Care X - Simple Patient / Family Education for ongoing care 1 15 []  - 0 Complex (extensive) Patient / Family Education for ongoing care X- 1 10 Staff obtains , Records, Test Results / Process Orders []  - 0 Staff telephones HHA, Nursing Homes / Clarify orders / etc []  - 0 Routine Transfer to another Facility (non-emergent condition) []  - 0 Routine Hospital Admission (non-emergent condition) []  - 0 New Admissions / / Ordering NPWT, Apligraf, etc. []  - 0 Emergency Hospital Admission (emergent condition) X- 1 10 Simple Discharge Coordination []  - 0 Complex (extensive) Discharge Coordination PROCESS - Special Needs []  - Pediatric / Minor Patient Management 0 []  - 0 Isolation Patient Management []  - 0 Hearing / Language / Visual special needs []  - 0 Assessment of Community assistance (transportation, D/C planning, etc.) []  - 0 Additional assistance / Altered mentation []  -  0 Support Surface(s) Assessment (bed, cushion, seat, etc.) INTERVENTIONS - Wound Cleansing / Measurement X - Wound Imaging (photographs - any number of wounds) 1 5 []  - 0 Wound Tracing (instead of photographs) X- 1 5 Simple Wound Measurement - one wound []  - 0 Complex Wound Measurement - multiple wounds TREY, GULBRANSON D. ( ) X-  1 5 Simple Wound Cleansing - one wound []  - 0 Complex Wound Cleansing - multiple wounds INTERVENTIONS - Wound Dressings X - Small Wound Dressing one or multiple wounds 1 10 []  - 0 Medium Wound Dressing one or multiple wounds []  - 0 Large Wound Dressing one or multiple wounds []  - 0 Application of Medications - injection INTERVENTIONS - Miscellaneous []  - External ear exam 0 []  - 0 Specimen Collection (cultures, biopsies, blood, body fluids, etc.) []  - 0 Specimen(s) / Culture(s) sent or taken to Lab for analysis []  - 0 Patient Transfer (multiple staff / / Similar devices) []  - 0 Simple Staple / Suture removal (25 or less) []  - 0 Complex Staple / Suture removal (26 or more) []  - 0 Hypo / Hyperglycemic Management (close monitor of Blood Glucose) X- 1 15 Ankle / Brachial Index (ABI) - do not check if billed separately Has the patient been seen at the hospital within the last three years: Yes Total Score: 125 Level Of Care: New/Established - Level 4 Electronic Signature(s) Signed: 09/21/2021 1:01:44 PM By: RN Entered By: on 09/19/2021 16:07:56 ( ) -------------------------------------------------------------------------------- Encounter Discharge Information Details Patient Name: . Date of Service: 09/19/2021 2:00 PM Medical Record Number: Patient Account Number: Date of Birth/Sex: 03/19/1951 (70 y.o. M) Treating RN: Yevonne Pax Primary Care Jezelle Gullick: Yevonne Pax Other Clinician: Referring Terell Kincy: 11/19/2021 Treating  Raahim Shartzer/Extender: Thomas Tanner in Treatment: 0 Encounter Discharge Information Items Discharge Condition: Stable Ambulatory Status: Ambulatory Discharge Destination: Home Transportation: Private Auto Accompanied By: self Schedule Follow-up Appointment: Yes Clinical Summary of Care: Patient Declined Electronic Signature(s) Signed: 09/19/2021 4:10:05 PM By: Thomas Fickle RN Entered By: 11/19/2021 on 09/19/2021 16:10:05 [id number] (02/15/1951) -------------------------------------------------------------------------------- Lower Extremity Assessment Details Patient Name: 05-16-1992. Date of Service: 09/19/2021 2:00 PM Medical Record Number: Beverely Risen Patient Account Number: Daryel November Date of Birth/Sex: May 23, 1951 (70 y.o. M) Treating RN: Yevonne Pax Primary Care Lexxi Koslow: Yevonne Pax Other Clinician: Referring Havah Ammon: 11/19/2021 Treating Tagen Milby/Extender: Thomas Tanner in Treatment: 0 Electronic Signature(s) Signed: 09/21/2021 1:01:44 PM By: Thomas Fickle RN Entered By: 11/19/2021 on 09/19/2021 14:05:26 [id number] (02/15/1951) -------------------------------------------------------------------------------- Multi Wound Chart Details Patient Name: 05-16-1992. Date of Service: 09/19/2021 2:00 PM Medical Record Number: Beverely Risen Patient Account Number: Daryel November Date of Birth/Sex: May 28, 1951 (70 y.o. M) Treating RN: Yevonne Pax Primary Care Lavontay Kirk: Yevonne Pax Other Clinician: Referring Kwabena Strutz: 11/19/2021 Treating Abigail Teall/Extender: Thomas Tanner in Treatment: 0 Vital Signs Height(in): 73 Pulse(bpm): 84 Weight(lbs): 400 Blood Pressure(mmHg): 151/78 Body Mass Index(BMI): 53 Temperature(F): 98.2 Respiratory Rate(breaths/min): 18 Photos: [N/A:N/A] Wound Location: Left Scrotum N/A N/A Wounding Event: Surgical Injury N/A N/A Primary Etiology: Infection - not elsewhere classified N/A  N/A Comorbid History: Congestive Heart Failure, N/A N/A Hypertension, Type II Diabetes Date Acquired: 06/09/2021 N/A N/A Weeks of Treatment: 0 N/A N/A Wound Status: Open N/A N/A Measurements L x W x D (cm) 4x4x0.1 N/A N/A Area (cm) : 12.566 N/A N/A Volume (cm) : 1.257 N/A N/A % Reduction in Area: 0.00% N/A N/A % Reduction in Volume: 0.00% N/A N/A Classification: Full Thickness Without Exposed N/A N/A Support Structures Exudate Amount: Medium N/A N/A Exudate Type: Serosanguineous N/A N/A Exudate Color: red, brown N/A N/A Granulation Amount: Large (67-100%) N/A N/A Granulation Quality: Red N/A N/A Necrotic Amount: None Present (0%) N/A N/A DATRON, BRAKEBILL ([id number]) Exposed Structures: Fat Layer (Subcutaneous Tissue): N/A N/A Yes  Fascia: No Tendon: No Muscle: No Joint: No Bone: No Epithelialization: None N/A N/A Treatment Notes Electronic Signature(s) Signed: 09/19/2021 4:06:19 PM By: Yevonne Pax RN Entered By: Yevonne Pax on 09/19/2021 16:06:18 Thomas Tanner ([id number]) -------------------------------------------------------------------------------- Multi-Disciplinary Care Plan Details Patient Name: Thomas Tanner. Date of Service: 09/19/2021 2:00 PM Medical Record Number: [id number] Patient Account Number: [id number] Date of Birth/Sex: 10-27-1951 (70 y.o. M) Treating RN: Yevonne Pax Primary Care Alexande Sheerin: Beverely Risen Other Clinician: Referring Hunter Pinkard: Daryel November Treating Taraann Olthoff/Extender: Altamese [place] in Treatment: 0 Active Inactive Electronic Signature(s) Signed: 10/11/2021 11:43:23 AM By: Elliot Gurney BSN, RN, CWS, Kim RN, BSN Signed: 10/12/2021 5:07:02 PM By: Yevonne Pax RN Previous Signature: 09/19/2021 4:06:00 PM Version By: Yevonne Pax RN Entered By: Elliot Gurney BSN, RN, CWS, Kim on 10/11/2021 11:43:23 KALDEN, WANKE ([id number]) -------------------------------------------------------------------------------- Pain Assessment  Details Patient Name: YORK, VALLIANT. Date of Service: 09/19/2021 2:00 PM Medical Record Number: [id number] Patient Account Number: [id number] Date of Birth/Sex: 10-07-1951 (70 y.o. M) Treating RN: Yevonne Pax Primary Care Meriam Chojnowski: Beverely Risen Other Clinician: Referring Jerrica Thorman: Daryel November Treating Javione Gunawan/Extender: Altamese [place] in Treatment: 0 Active Problems Location of Pain Severity and Description of Pain Patient Has Paino Yes Site Locations With Dressing Change: Yes Duration of the Pain. Constant / Intermittento Intermittent How Long Does it Lasto Hours: Minutes: 15 Rate the pain. Current Pain Level: 0 Worst Pain Level: 1 Least Pain Level: 0 Tolerable Pain Level: 5 Character of Pain Describe the Pain: Burning Pain Management and Medication Current Pain Management: Medication: No Cold Application: No Rest: Yes Massage: No Activity: No T.E.N.S.: No Heat Application: No Leg drop or elevation: No Is the Current Pain Management Adequate: Inadequate How does your wound impact your activities of daily livingo Sleep: No Bathing: No Appetite: No Relationship With Others: No Bladder Continence: No Emotions: No Bowel Continence: No Work: No Toileting: No Drive: No Dressing: No Hobbies: No Electronic Signature(s) Signed: 09/21/2021 1:01:44 PM By: Yevonne Pax RN Entered By: Yevonne Pax on 09/19/2021 14:04:20 Thomas Tanner ([id number]) -------------------------------------------------------------------------------- Patient/Caregiver Education Details Patient Name: Thomas Tanner. Date of Service: 09/19/2021 2:00 PM Medical Record Number: [id number] Patient Account Number: [id number] Date of Birth/Gender: [birth year] (70 y.o. M) Treating RN: Yevonne Pax Primary Care Physician: Beverely Risen Other Clinician: Referring Physician: Daryel November Treating Physician/Extender: Altamese [place] in Treatment: 0 Education  Assessment Education Provided To: Patient Education Topics Provided Wound/Skin Impairment: Methods: Explain/Verbal Responses: State content correctly Electronic Signature(s) Signed: 09/21/2021 1:01:44 PM By: Yevonne Pax RN Entered By: Yevonne Pax on 09/19/2021 16:08:41 Thomas Tanner ([id number]) -------------------------------------------------------------------------------- Wound Assessment Details Patient Name: Thomas Tanner. Date of Service: 09/19/2021 2:00 PM Medical Record Number: [id number] Patient Account Number: [id number] Date of Birth/Sex: Apr 06, 1951 (70 y.o. M) Treating RN: Yevonne Pax Primary Care Nechelle Petrizzo: Beverely Risen Other Clinician: Referring Ramzy Cappelletti: Daryel November Treating Culley Hedeen/Extender: Altamese [place] in Treatment: 0 Wound Status Wound Number: 1 Primary Etiology: Infection - not elsewhere classified Wound Location: Left Scrotum Wound Status: Open Wounding Event: Surgical Injury Comorbid Congestive Heart Failure, Hypertension, Type II History: Diabetes Date Acquired: 06/09/2021 Weeks Of Treatment: 0 Clustered Wound: No Photos Wound Measurements Length: (cm) 4 Width: (cm) 4 Depth: (cm) 0.1 Area: (cm) 12.566 Volume: (cm) 1.257 % Reduction in Area: 0% % Reduction in Volume: 0% Epithelialization: None Tunneling: No Undermining: No Wound Description Classification: Full Thickness Without Exposed Support Structures Exudate Amount: Medium Exudate Type: Serosanguineous Exudate Color: red, brown Foul Odor After Cleansing: No Slough/Fibrino No  Wound Bed Granulation Amount: Large (67-100%) Exposed Structure Granulation Quality: Red Fascia Exposed: No Necrotic Amount: None Present (0%) Fat Layer (Subcutaneous Tissue) Exposed: Yes Tendon Exposed: No Muscle Exposed: No Joint Exposed: No Bone Exposed: No Electronic Signature(s) Signed: 09/19/2021 4:05:28 PM By: Yevonne Pax RN Entered By: Yevonne Pax on 09/19/2021  16:05:27 Thomas Tanner ([id number]) -------------------------------------------------------------------------------- Vitals Details Patient Name: [name] D. Date of Service: 09/19/2021 2:00 PM Medical Record Number: [id number] Patient Account Number: [id number] Date of Birth/Sex: 05-07-1951 (70 y.o. M) Treating RN: Yevonne Pax Primary Care Kamariyah Timberlake: Beverely Risen Other Clinician: Referring Diana Davenport: Daryel November Treating Jaykwon Morones/Extender: Altamese [place] in Treatment: 0 Vital Signs Time Taken: 14:04 Temperature (F): 98.2 Height (in): 73 Pulse (bpm): 84 Source: Stated Respiratory Rate (breaths/min): 18 Weight (lbs): 400 Blood Pressure (mmHg): 151/78 Source: Stated Reference Range: 80 - 120 mg / dl Body Mass Index (BMI): 52.8 Electronic Signature(s) Signed: 09/21/2021 1:01:44 PM By: Yevonne Pax RN Entered By: Yevonne Pax on 09/19/2021 14:05:12

## 2021-09-22 ENCOUNTER — Ambulatory Visit: Payer: Medicare Other | Admitting: Pharmacist

## 2021-09-22 DIAGNOSIS — J441 Chronic obstructive pulmonary disease with (acute) exacerbation: Secondary | ICD-10-CM

## 2021-09-22 DIAGNOSIS — I5032 Chronic diastolic (congestive) heart failure: Secondary | ICD-10-CM

## 2021-09-22 DIAGNOSIS — I10 Essential (primary) hypertension: Secondary | ICD-10-CM

## 2021-09-22 DIAGNOSIS — E785 Hyperlipidemia, unspecified: Secondary | ICD-10-CM

## 2021-09-22 DIAGNOSIS — Z720 Tobacco use: Secondary | ICD-10-CM

## 2021-09-22 NOTE — Patient Instructions (Addendum)
Visit Information   Goals Addressed             This Visit's Progress    Track and Manage Activity and Exertion-Heart Failure       Timeframe:  Long-Range Goal Priority:  High Start Date:    09/22/2021                         Expected End Date:  03/23/22                     Follow Up Date 12/23/21    - avoid heavy exercise on very hot days - drink water to stay hydrated during exercise - follow activity or exercise plan - pace activity allowing for rest    Why is this important?   Exercising is very important when managing your heart failure.  It will help your heart get stronger.    Notes:         Thomas Tanner was given information about Chronic Care Management services today including:  CCM service includes personalized support from designated clinical staff supervised by his physician, including individualized plan of care and coordination with other care providers 24/7 contact phone numbers for assistance for urgent and routine care needs. Standard insurance, coinsurance, copays and deductibles apply for chronic care management only during months in which we provide at least 20 minutes of these services. Most insurances cover these services at 100%, however patients may be responsible for any copay, coinsurance and/or deductible if applicable. This service may help you avoid the need for more expensive face-to-face services. Only one practitioner may furnish and bill the service in a calendar month. The patient may stop CCM services at any time (effective at the end of the month) by phone call to the office staff.  Patient agreed to services and verbal consent obtained.   The patient verbalized understanding of instructions, educational materials, and care plan provided today and agreed to receive a mailed copy of patient instructions, educational materials, and care plan.  Telephone follow up appointment with pharmacy team member scheduled for: 6 months  Erroll Luna, [place]  [phone]

## 2021-09-25 ENCOUNTER — Telehealth: Payer: Self-pay

## 2021-09-25 ENCOUNTER — Other Ambulatory Visit: Payer: Self-pay | Admitting: Internal Medicine

## 2021-09-25 DIAGNOSIS — Z9981 Dependence on supplemental oxygen: Secondary | ICD-10-CM | POA: Diagnosis not present

## 2021-09-25 DIAGNOSIS — R532 Functional quadriplegia: Secondary | ICD-10-CM | POA: Diagnosis not present

## 2021-09-25 DIAGNOSIS — K5909 Other constipation: Secondary | ICD-10-CM | POA: Diagnosis not present

## 2021-09-25 DIAGNOSIS — Z86711 Personal history of pulmonary embolism: Secondary | ICD-10-CM | POA: Diagnosis not present

## 2021-09-25 DIAGNOSIS — I700 Atherosclerosis of aorta: Secondary | ICD-10-CM | POA: Diagnosis not present

## 2021-09-25 DIAGNOSIS — F17210 Nicotine dependence, cigarettes, uncomplicated: Secondary | ICD-10-CM | POA: Diagnosis not present

## 2021-09-25 DIAGNOSIS — Z7901 Long term (current) use of anticoagulants: Secondary | ICD-10-CM | POA: Diagnosis not present

## 2021-09-25 DIAGNOSIS — N1831 Chronic kidney disease, stage 3a: Secondary | ICD-10-CM | POA: Diagnosis not present

## 2021-09-25 DIAGNOSIS — K219 Gastro-esophageal reflux disease without esophagitis: Secondary | ICD-10-CM | POA: Diagnosis not present

## 2021-09-25 DIAGNOSIS — J9611 Chronic respiratory failure with hypoxia: Secondary | ICD-10-CM | POA: Diagnosis not present

## 2021-09-25 DIAGNOSIS — D509 Iron deficiency anemia, unspecified: Secondary | ICD-10-CM | POA: Diagnosis not present

## 2021-09-25 DIAGNOSIS — I2510 Atherosclerotic heart disease of native coronary artery without angina pectoris: Secondary | ICD-10-CM | POA: Diagnosis not present

## 2021-09-25 DIAGNOSIS — Z4800 Encounter for change or removal of nonsurgical wound dressing: Secondary | ICD-10-CM | POA: Diagnosis not present

## 2021-09-25 DIAGNOSIS — E871 Hypo-osmolality and hyponatremia: Secondary | ICD-10-CM | POA: Diagnosis not present

## 2021-09-25 DIAGNOSIS — J449 Chronic obstructive pulmonary disease, unspecified: Secondary | ICD-10-CM | POA: Diagnosis not present

## 2021-09-25 DIAGNOSIS — I130 Hypertensive heart and chronic kidney disease with heart failure and stage 1 through stage 4 chronic kidney disease, or unspecified chronic kidney disease: Secondary | ICD-10-CM | POA: Diagnosis not present

## 2021-09-25 DIAGNOSIS — J4520 Mild intermittent asthma, uncomplicated: Secondary | ICD-10-CM | POA: Diagnosis not present

## 2021-09-25 DIAGNOSIS — E872 Acidosis, unspecified: Secondary | ICD-10-CM | POA: Diagnosis not present

## 2021-09-25 DIAGNOSIS — I5032 Chronic diastolic (congestive) heart failure: Secondary | ICD-10-CM | POA: Diagnosis not present

## 2021-09-25 DIAGNOSIS — E785 Hyperlipidemia, unspecified: Secondary | ICD-10-CM | POA: Diagnosis not present

## 2021-09-25 DIAGNOSIS — R1310 Dysphagia, unspecified: Secondary | ICD-10-CM | POA: Diagnosis not present

## 2021-09-25 NOTE — Telephone Encounter (Signed)
Attempted to contact patient to schedule appointment for new surgeon referral. No answer and voicemail no set up. I spoke with his son, he will have patient return my call-Toni

## 2021-09-25 NOTE — Telephone Encounter (Signed)
Thomas Tanner with [facility] returned my call. They do have a lift and can accommodate this patient. Referral placed today.

## 2021-09-25 NOTE — Addendum Note (Signed)
Addended by: Debarah Crape on: 09/25/2021 03:43 PM   Modules accepted: Orders

## 2021-09-26 DIAGNOSIS — I700 Atherosclerosis of aorta: Secondary | ICD-10-CM | POA: Diagnosis not present

## 2021-09-26 DIAGNOSIS — J4520 Mild intermittent asthma, uncomplicated: Secondary | ICD-10-CM | POA: Diagnosis not present

## 2021-09-26 DIAGNOSIS — E871 Hypo-osmolality and hyponatremia: Secondary | ICD-10-CM | POA: Diagnosis not present

## 2021-09-26 DIAGNOSIS — R532 Functional quadriplegia: Secondary | ICD-10-CM | POA: Diagnosis not present

## 2021-09-26 DIAGNOSIS — K219 Gastro-esophageal reflux disease without esophagitis: Secondary | ICD-10-CM | POA: Diagnosis not present

## 2021-09-26 DIAGNOSIS — D509 Iron deficiency anemia, unspecified: Secondary | ICD-10-CM | POA: Diagnosis not present

## 2021-09-26 DIAGNOSIS — N1831 Chronic kidney disease, stage 3a: Secondary | ICD-10-CM | POA: Diagnosis not present

## 2021-09-26 DIAGNOSIS — Z7901 Long term (current) use of anticoagulants: Secondary | ICD-10-CM | POA: Diagnosis not present

## 2021-09-26 DIAGNOSIS — I2510 Atherosclerotic heart disease of native coronary artery without angina pectoris: Secondary | ICD-10-CM | POA: Diagnosis not present

## 2021-09-26 DIAGNOSIS — Z9981 Dependence on supplemental oxygen: Secondary | ICD-10-CM | POA: Diagnosis not present

## 2021-09-26 DIAGNOSIS — J449 Chronic obstructive pulmonary disease, unspecified: Secondary | ICD-10-CM | POA: Diagnosis not present

## 2021-09-26 DIAGNOSIS — F17210 Nicotine dependence, cigarettes, uncomplicated: Secondary | ICD-10-CM | POA: Diagnosis not present

## 2021-09-26 DIAGNOSIS — I130 Hypertensive heart and chronic kidney disease with heart failure and stage 1 through stage 4 chronic kidney disease, or unspecified chronic kidney disease: Secondary | ICD-10-CM | POA: Diagnosis not present

## 2021-09-26 DIAGNOSIS — R1310 Dysphagia, unspecified: Secondary | ICD-10-CM | POA: Diagnosis not present

## 2021-09-26 DIAGNOSIS — I5032 Chronic diastolic (congestive) heart failure: Secondary | ICD-10-CM | POA: Diagnosis not present

## 2021-09-26 DIAGNOSIS — K5909 Other constipation: Secondary | ICD-10-CM | POA: Diagnosis not present

## 2021-09-26 DIAGNOSIS — E785 Hyperlipidemia, unspecified: Secondary | ICD-10-CM | POA: Diagnosis not present

## 2021-09-26 DIAGNOSIS — E872 Acidosis, unspecified: Secondary | ICD-10-CM | POA: Diagnosis not present

## 2021-09-26 DIAGNOSIS — Z86711 Personal history of pulmonary embolism: Secondary | ICD-10-CM | POA: Diagnosis not present

## 2021-09-26 DIAGNOSIS — Z4800 Encounter for change or removal of nonsurgical wound dressing: Secondary | ICD-10-CM | POA: Diagnosis not present

## 2021-09-26 DIAGNOSIS — J9611 Chronic respiratory failure with hypoxia: Secondary | ICD-10-CM | POA: Diagnosis not present

## 2021-09-27 ENCOUNTER — Telehealth: Payer: Self-pay | Admitting: Pharmacist

## 2021-09-27 NOTE — Telephone Encounter (Signed)
Per [facility] this was addressed

## 2021-09-27 NOTE — Progress Notes (Signed)
    Chronic Care Management Pharmacy Assistant   Name: Thomas Tanner  MRN: [id number] DOB: 1951-11-03  Reason for Encounter: PAP  Medications: Outpatient Encounter Medications as of 09/27/2021  Medication Sig   albuterol (VENTOLIN HFA) 108 (90 Base) MCG/ACT inhaler Inhale 2 puffs into the lungs every 6 (six) hours as needed for wheezing or shortness of breath.   apixaban (ELIQUIS) 5 MG TABS tablet Take 1 tablet (5 mg total) by mouth 2 (two) times daily.   famotidine (PEPCID) 20 MG tablet Take 1 tablet (20 mg total) by mouth 2 (two) times daily.   iron polysaccharides (NIFEREX) 150 MG capsule Take 1 capsule (150 mg total) by mouth daily.   metoprolol succinate (TOPROL-XL) 50 MG 24 hr tablet Take 1 tablet by mouth once daily   phenylephrine-shark liver oil-mineral oil-petrolatum (PREPARATION H) 0.25-14-74.9 % rectal ointment Place 1 application rectally 2 (two) times daily as needed for hemorrhoids.   rosuvastatin (CRESTOR) 20 MG tablet Take 1 tablet (20 mg total) by mouth daily.   senna-docusate (SENOKOT-S) 8.6-50 MG tablet Take 1 tablet by mouth 2 (two) times daily.   vitamin B-12 1000 MCG tablet Take 1 tablet (1,000 mcg total) by mouth daily.   No facility-administered encounter medications on file as of 09/27/2021.    New patient assistance application form filled out to [facility] for [employer]. Waiting for patient and provider to complete and sign documentation. Called patient to inquire if they wanted the application mailed to them or if they wanted to come into the office. Patient is required to sign application and to bring/have proof of income. He stated he would be willing to come into office to bring proof of income and sign application once he received all of the needed paperwork, he would like it mailed to their residence address [address] [place] [address].  Follow-Up:Pharmacist Review  Hulen Luster, RMA Clinical Pharmacist  Assistant [phone]

## 2021-09-29 ENCOUNTER — Telehealth: Payer: Self-pay | Admitting: Physician Assistant

## 2021-09-29 NOTE — Telephone Encounter (Signed)
[facility] plastic surgery contacted [place] back today to explain that they cannot in fact accommodate this patient with a BMI of 53.  I have now reached out to [facility] plastic and reconstructive surgery, who is stating that for patients with BMI greater than 35 with an exposed wound, they need to see [facility] wound care first.  I spoke with plastics and reconstructive and wound triage nurse Lyla Son to explain the patient's situation.  She is going to reach out to her team and will let us know if they can accommodate this patient.

## 2021-10-01 DIAGNOSIS — J449 Chronic obstructive pulmonary disease, unspecified: Secondary | ICD-10-CM | POA: Diagnosis not present

## 2021-10-02 ENCOUNTER — Encounter: Payer: Self-pay | Admitting: Nurse Practitioner

## 2021-10-02 ENCOUNTER — Other Ambulatory Visit: Payer: Self-pay

## 2021-10-02 ENCOUNTER — Ambulatory Visit (INDEPENDENT_AMBULATORY_CARE_PROVIDER_SITE_OTHER): Payer: Medicare Other | Admitting: Nurse Practitioner

## 2021-10-02 VITALS — BP 126/62 | HR 89 | Temp 98.0°F | Resp 16 | Ht 73.0 in | Wt >= 6400 oz

## 2021-10-02 DIAGNOSIS — Q5529 Other congenital malformations of testis and scrotum: Secondary | ICD-10-CM

## 2021-10-02 DIAGNOSIS — S3130XD Unspecified open wound of scrotum and testes, subsequent encounter: Secondary | ICD-10-CM

## 2021-10-02 DIAGNOSIS — Q5520 Unspecified congenital malformations of testis and scrotum: Secondary | ICD-10-CM

## 2021-10-02 NOTE — Progress Notes (Signed)
[facility] [address] [place], [place] [address]  Internal MEDICINE  Office Visit Note  Patient Name: Thomas Tanner  [id number]  [id number]  Date of Service: 10/02/2021  Chief Complaint  Patient presents with   Follow-up    Needs referral to plastic surgeon, exposed left testicle, moving around causes pain    HPI Thomas Tanner presents for a follow up visit for a general surgery referral. In June this year, he was hospitalized for fornier's gangrene. Part of his scrotum was removed and his left testicle was left hanging out unprotected. He was sent home from the hospital like this. He was recently seen by the wound clinic and was informed that they were unable to do anything and he would need surgery to fix the problem. This is causing problems with sitting, changing positions and when voiding or having a bowel movement. There is a significant risk for infection if this area is continued to be left open. The wound clinic took updated pictures of the area but they are not available in epic.     Current Medication: Outpatient Encounter Medications as of 10/02/2021  Medication Sig   albuterol (VENTOLIN HFA) 108 (90 Base) MCG/ACT inhaler Inhale 2 puffs into the lungs every 6 (six) hours as needed for wheezing or shortness of breath.   apixaban (ELIQUIS) 5 MG TABS tablet Take 1 tablet (5 mg total) by mouth 2 (two) times daily.   famotidine (PEPCID) 20 MG tablet Take 1 tablet (20 mg total) by mouth 2 (two) times daily.   iron polysaccharides (NIFEREX) 150 MG capsule Take 1 capsule (150 mg total) by mouth daily.   metoprolol succinate (TOPROL-XL) 50 MG 24 hr tablet Take 1 tablet by mouth once daily   phenylephrine-shark liver oil-mineral oil-petrolatum (PREPARATION H) 0.25-14-74.9 % rectal ointment Place 1 application rectally 2 (two) times daily as needed for hemorrhoids.   rosuvastatin (CRESTOR) 20 MG tablet Take 1 tablet (20 mg total) by mouth daily.   senna-docusate  (SENOKOT-S) 8.6-50 MG tablet Take 1 tablet by mouth 2 (two) times daily.   vitamin B-12 1000 MCG tablet Take 1 tablet (1,000 mcg total) by mouth daily.   No facility-administered encounter medications on file as of 10/02/2021.    Surgical History: Past Surgical History:  Procedure Laterality Date   CORONARY/GRAFT ACUTE MI REVASCULARIZATION N/A 12/08/2018   Procedure: Coronary/Graft Acute MI Revascularization;  Surgeon: Alwyn Pea, MD;  Location: [facility] INVASIVE CV LAB;  Service: Cardiovascular;  Laterality: N/A;   INCISION AND DRAINAGE ABSCESS N/A 05/18/2021   Procedure: INCISION AND DRAINAGE SCROTAL ABSCESS, cystoscopy with urethral dilation, and complex catheter placement;  Surgeon: Sondra Come, MD;  Location: [facility] ORS;  Service: Urology;  Laterality: N/A;   IRRIGATION AND DEBRIDEMENT ABSCESS N/A 05/20/2021   Procedure: IRRIGATION AND DEBRIDEMENT ABSCESS;  Surgeon: Riki Altes, MD;  Location: [facility] ORS;  Service: Urology;  Laterality: N/A;   IRRIGATION AND DEBRIDEMENT ABSCESS N/A 05/21/2021   Procedure: IRRIGATION AND DEBRIDEMENT ABSCESS;  Surgeon: Riki Altes, MD;  Location: [facility] ORS;  Service: Urology;  Laterality: N/A;   KIDNEY STONE SURGERY     left arm surgery  1963   LEFT HEART CATH AND CORONARY ANGIOGRAPHY N/A 12/08/2018   Procedure: LEFT HEART CATH AND CORONARY ANGIOGRAPHY;  Surgeon: Alwyn Pea, MD;  Location: [facility] INVASIVE CV LAB;  Service: Cardiovascular;  Laterality: N/A;   PULMONARY THROMBECTOMY N/A 08/03/2020   Procedure: PULMONARY THROMBECTOMY / THROMBOLYSIS;  Surgeon: Annice Needy, MD;  Location: [facility] INVASIVE CV LAB;  Service: Cardiovascular;  Laterality: N/A;   RECTAL EXAM UNDER ANESTHESIA N/A 05/20/2021   Procedure: EXAM UNDER ANESTHESIA-Dressing and Change;  Surgeon: Riki Altes, MD;  Location: [facility] ORS;  Service: Urology;  Laterality: N/A;    Medical History: Past Medical History:  Diagnosis Date   Arthritis    CHF (congestive heart  failure) (HCC)    COPD (chronic obstructive pulmonary disease) (HCC)    Diabetes (HCC)    Hyperlipidemia    Hypertension    Kidney stone    Obesity    Tachycardia    TIA (transient ischemic attack)     Family History: Family History  Problem Relation Age of Onset   Alzheimer's disease Mother    CAD Father     Social History   Socioeconomic History   Marital status: Married    Spouse name: [name]    Number of children: 3   Years of education: Not on file   Highest education level: Not on file  Occupational History   Not on file  Tobacco Use   Smoking status: Every Day    Packs/day: 0.50    Types: Cigarettes    Last attempt to quit: 07/13/2020    Years since quitting: 1.2   Smokeless tobacco: Never  Substance and Sexual Activity   Alcohol use: No   Drug use: No   Sexual activity: Not Currently  Other Topics Concern   Not on file  Social History Narrative   Lives at home with wife , youngest son, grand daughter and great greatson    Social Determinants of Health   Financial Resource Strain: Not on file  Food Insecurity: No Food Insecurity   Worried About Programme researcher, broadcasting/film/video in the Last Year: Never true   Barista in the Last Year: Never true  Transportation Needs: Unmet Transportation Needs   Lack of Transportation (Medical): Yes   Lack of Transportation (Non-Medical): Yes  Physical Activity: Not on file  Stress: Not on file  Social Connections: Not on file  Intimate Partner Violence: Not on file      Review of Systems  Constitutional:  Negative for chills, fatigue and unexpected weight change.  HENT:  Negative for congestion, rhinorrhea, sneezing and sore throat.   Eyes:  Negative for redness.  Respiratory:  Negative for cough, chest tightness and shortness of breath.   Cardiovascular:  Negative for chest pain and palpitations.  Gastrointestinal:  Negative for abdominal pain, constipation, diarrhea, nausea and vomiting.  Genitourinary:  Negative  for dysuria and frequency.       Scrotum is open, left testicle is uncovered and hanging out of open scrotum  Musculoskeletal:  Negative for arthralgias, back pain, joint swelling and neck pain.  Skin:  Negative for rash.  Neurological: Negative.  Negative for tremors and numbness.  Hematological:  Negative for adenopathy. Does not bruise/bleed easily.  Psychiatric/Behavioral:  Negative for behavioral problems (Depression), sleep disturbance and suicidal ideas. The patient is not nervous/anxious.    Vital Signs: BP 126/62   Pulse 89   Temp 98 F (36.7 C)   Resp 16   Ht 6\' 1"  (1.854 m)   Wt (!) 400 lb (181.4 kg)   SpO2 96%   BMI 52.77 kg/m    Physical Exam Vitals reviewed.  Constitutional:      General: He is not in acute distress.    Appearance: Normal appearance. He is obese. He is not ill-appearing.  HENT:  Head: Normocephalic and atraumatic.  Eyes:     Extraocular Movements: Extraocular movements intact.     Pupils: Pupils are equal, round, and reactive to light.  Cardiovascular:     Rate and Rhythm: Normal rate and regular rhythm.  Pulmonary:     Effort: Pulmonary effort is normal. No respiratory distress.  Genitourinary:    Comments: Patient is unable to get out of wheelchair or sit on exam table so the area was not able to be assessed but the pictures of the area from June are in his chart and were reviewed. The wound clinic took additional updated pictures but they are not uploaded in his epic chart. Neurological:     Mental Status: He is alert and oriented to person, place, and time.  Psychiatric:        Mood and Affect: Mood normal.        Behavior: Behavior normal.       Assessment/Plan: 1. Anomaly of testicle Referral to general surgery to correct problem open scrotum and unprotected left testicle freely hanging. The patient is ok with removing the testicle if necessary. This problem is impacting his daily life and ADLs and is a high risk for infection.   - Ambulatory referral to General Surgery   General Counseling: Thomas Tanner verbalizes understanding of the findings of todays visit and agrees with plan of treatment. I have discussed any further diagnostic evaluation that may be needed or ordered today. We also reviewed his medications today. he has been encouraged to call the office with any questions or concerns that should arise related to todays visit.    Orders Placed This Encounter  Procedures   Ambulatory referral to General Surgery    No orders of the defined types were placed in this encounter.   Return if symptoms worsen or fail to improve.   Total time spent:30 Minutes Time spent includes review of chart, medications, test results, and follow up plan with the patient.   [place] Controlled Substance Database was reviewed by me.  This patient was seen by Sallyanne Kuster, FNP-C in collaboration with Dr. Beverely Risen as a part of collaborative care agreement.   Amory Zbikowski R. Tedd Sias, MSN, FNP-C Internal medicine

## 2021-10-03 ENCOUNTER — Telehealth: Payer: Self-pay | Admitting: Pharmacist

## 2021-10-03 NOTE — Progress Notes (Signed)
    Chronic Care Management Pharmacy Assistant   Name: Thomas Tanner  MRN: [id number] DOB: 1951/01/27  Reason for Encounter: CCM Care Plan   Medications: Outpatient Encounter Medications as of 10/03/2021  Medication Sig   albuterol (VENTOLIN HFA) 108 (90 Base) MCG/ACT inhaler Inhale 2 puffs into the lungs every 6 (six) hours as needed for wheezing or shortness of breath.   apixaban (ELIQUIS) 5 MG TABS tablet Take 1 tablet (5 mg total) by mouth 2 (two) times daily.   famotidine (PEPCID) 20 MG tablet Take 1 tablet (20 mg total) by mouth 2 (two) times daily.   iron polysaccharides (NIFEREX) 150 MG capsule Take 1 capsule (150 mg total) by mouth daily.   metoprolol succinate (TOPROL-XL) 50 MG 24 hr tablet Take 1 tablet by mouth once daily   phenylephrine-shark liver oil-mineral oil-petrolatum (PREPARATION H) 0.25-14-74.9 % rectal ointment Place 1 application rectally 2 (two) times daily as needed for hemorrhoids.   rosuvastatin (CRESTOR) 20 MG tablet Take 1 tablet (20 mg total) by mouth daily.   senna-docusate (SENOKOT-S) 8.6-50 MG tablet Take 1 tablet by mouth 2 (two) times daily.   vitamin B-12 1000 MCG tablet Take 1 tablet (1,000 mcg total) by mouth daily.   No facility-administered encounter medications on file as of 10/03/2021.   Reviewed the patients initial visit reinsured it was completed per the pharmacist Erskine Emery request. Printed the CCM care plan. Mailed the patient CCM care plan to their most recent address on file.  Follow-Up:Pharmacist Review  Hulen Luster, RMA Clinical Pharmacist Assistant [phone]

## 2021-10-08 DIAGNOSIS — J9601 Acute respiratory failure with hypoxia: Secondary | ICD-10-CM | POA: Diagnosis not present

## 2021-10-08 DIAGNOSIS — J449 Chronic obstructive pulmonary disease, unspecified: Secondary | ICD-10-CM | POA: Diagnosis not present

## 2021-10-09 ENCOUNTER — Ambulatory Visit (INDEPENDENT_AMBULATORY_CARE_PROVIDER_SITE_OTHER): Payer: Medicare Other | Admitting: Surgery

## 2021-10-09 ENCOUNTER — Encounter: Payer: Self-pay | Admitting: Surgery

## 2021-10-09 ENCOUNTER — Other Ambulatory Visit: Payer: Self-pay

## 2021-10-09 VITALS — BP 154/83 | HR 93 | Temp 98.7°F | Ht 73.0 in | Wt >= 6400 oz

## 2021-10-09 DIAGNOSIS — S3130XA Unspecified open wound of scrotum and testes, initial encounter: Secondary | ICD-10-CM

## 2021-10-09 DIAGNOSIS — I5032 Chronic diastolic (congestive) heart failure: Secondary | ICD-10-CM | POA: Diagnosis not present

## 2021-10-09 DIAGNOSIS — I10 Essential (primary) hypertension: Secondary | ICD-10-CM | POA: Diagnosis not present

## 2021-10-09 DIAGNOSIS — J441 Chronic obstructive pulmonary disease with (acute) exacerbation: Secondary | ICD-10-CM | POA: Diagnosis not present

## 2021-10-09 DIAGNOSIS — S3994XS Unspecified injury of external genitals, sequela: Secondary | ICD-10-CM

## 2021-10-09 NOTE — Patient Instructions (Signed)
You will receive a call from Urology. Please call our office if you do not hear from anyone within 7 days.

## 2021-10-10 ENCOUNTER — Other Ambulatory Visit: Payer: Self-pay | Admitting: Physician Assistant

## 2021-10-10 NOTE — Telephone Encounter (Signed)
I have spent the morning on the phone with various [facility] clinics coordinating care for this patient between their urology, wound, and plastics departments. Ultimately, wound has agreed to see the patient for an initial evaluation of this patient with an exposed, viable left testicle following Fournier's gangrene debridement in June 2022. I explained that he will ultimately need a wound flap vs graft for secondary closure with plastics. I have been assured that wound can facilitate this after their initial evaluation.  I made an appointment for the patient with NP Iris Pert at [facility] on [address] [place], [place] [address] on Thursday, 10/26/2021 at 1pm. Their direct number for appointment scheduling is [phone]. I spoke with the patient this afternoon and provided him with his appointment information; he agreed and stated he would be able to make the appointment as scheduled.

## 2021-10-11 NOTE — Progress Notes (Signed)
Patient ID: Thomas Tanner, male   DOB: 1951/04/12, 70 y.o.   MRN: [id number]  HPI Thomas Tanner is a 70 y.o. male seen in consultation at the request of MRS. ABERNATHY. 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 He is status post multiple debridements and currently his infection is under control.  He does have a chronic wound to the scrotum that seems to impair his quality of life.  I have personally reviewed all the operative notes and first debridement was May 18, 2021 he had subsequently 2 other debridements..  He has been seen by urology and was referred to plastic surgery.  He does encounter significant barriers and apparently plastic has refused to see him because of his weight. He does have chronic anemia his last hemoglobin was 10 Last BMP shows a normal creatinine.  Much unremarkable.  He does continue to smoke daily.  He has significant mobility issues given his body habitus COPD CHF. He Did have a CT back in June showing evidence of necrotizing infection to the left testicle and scrotum HPI  Past Medical History:  Diagnosis Date   Arthritis    CHF (congestive heart failure) (HCC)    COPD (chronic obstructive pulmonary disease) (HCC)    Diabetes (HCC)    Hyperlipidemia    Hypertension    Kidney stone    Obesity    Tachycardia    TIA (transient ischemic attack)     Past Surgical History:  Procedure Laterality Date   CORONARY/GRAFT ACUTE MI REVASCULARIZATION N/A 12/08/2018   Procedure: Coronary/Graft Acute MI Revascularization;  Surgeon: Alwyn Pea, MD;  Location: [facility] INVASIVE CV LAB;  Service: Cardiovascular;  Laterality: N/A;   INCISION AND DRAINAGE ABSCESS N/A 05/18/2021   Procedure: INCISION AND DRAINAGE SCROTAL ABSCESS, cystoscopy with urethral dilation, and complex catheter placement;  Surgeon: Sondra Come, MD;  Location: [facility] ORS;   Service: Urology;  Laterality: N/A;   IRRIGATION AND DEBRIDEMENT ABSCESS N/A 05/20/2021   Procedure: IRRIGATION AND DEBRIDEMENT ABSCESS;  Surgeon: Riki Altes, MD;  Location: [facility] OR[facility];  Service: Urology;  Laterality: N/A;   IRRIGATION AND DEBRIDEMENT ABSCESS N/A 05/21/2021   Procedure: IRRIGATION AND DEBRIDEMENT ABSCESS;  Surgeon: Riki Altes, MD;  Location: [facility] ORS;  Service: Urology;  Laterality: N/A;   KIDNEY STONE SURGERY     left arm surgery  1963   LEFT HEART CATH AND CORONARY ANGIOGRAPHY N/A 12/08/2018   Procedure: LEFT HEART CATH AND CORONARY ANGIOGRAPHY;  Surgeon: Alwyn Pea, MD;  Location: [facility];  Service: Cardiovascular;  Laterality: N/A;   PULMONARY THROMBECTOMY N/A 08/03/2020   Procedure: PULMONARY THROMBECTOMY / THROMBOLYSIS;  Surgeon: Annice Needy, MD;  Location: [facility] LAB;  Service: Cardiovascular;  Laterality: N/A;   RECTAL EXAM UNDER ANESTHESIA N/A 05/20/2021   Procedure: EXAM UNDER ANESTHESIA-Dressing and Change;  Surgeon: Riki Altes, MD;  Location: [facility] ORS;  Service: Urology;  Laterality: N/A;    Family History  Problem Relation Age of Onset   Alzheimer's disease Mother    CAD Father     Social History Social History   Tobacco Use   Smoking status: Every Day    Packs/day: 0.50    Types: Cigarettes    Last attempt to quit: 07/13/2020    Years since quitting: 1.2   Smokeless tobacco: Never  Substance Use Topics   Alcohol  use: No   Drug use: No    Allergies  Allergen Reactions   Morphine Anxiety and Other (See Comments)    Hallucinations  Pt states 6.17.22 that he is not allergic to this medication   Morphine And Related Anxiety    Current Outpatient Medications  Medication Sig Dispense Refill   albuterol (VENTOLIN HFA) 108 (90 Base) MCG/ACT inhaler Inhale 2 puffs into the lungs every 6 (six) hours as needed for wheezing or shortness of breath. 48 g 1   apixaban (ELIQUIS) 5 MG TABS tablet Take 1 tablet (5  mg total) by mouth 2 (two) times daily. 180 tablet 1   famotidine (PEPCID) 20 MG tablet Take 1 tablet (20 mg total) by mouth 2 (two) times daily. 180 tablet 1   iron polysaccharides (NIFEREX) 150 MG capsule Take 1 capsule (150 mg total) by mouth daily. 30 capsule 0   metoprolol succinate (TOPROL-XL) 50 MG 24 hr tablet Take 1 tablet by mouth once daily 30 tablet 3   phenylephrine-shark liver oil-mineral oil-petrolatum (PREPARATION H) 0.25-14-74.9 % rectal ointment Place 1 application rectally 2 (two) times daily as needed for hemorrhoids.     rosuvastatin (CRESTOR) 20 MG tablet Take 1 tablet (20 mg total) by mouth daily. 90 tablet 1   senna-docusate (SENOKOT-S) 8.6-50 MG tablet Take 1 tablet by mouth 2 (two) times daily.     vitamin B-12 1000 MCG tablet Take 1 tablet (1,000 mcg total) by mouth daily. 30 tablet 0   No current facility-administered medications for this visit.     Review of Systems Full ROS  was asked and was negative except for the information on the HPI  Physical Exam Blood pressure (!) 154/83, pulse 93, temperature 98.7 F (37.1 C), temperature source Oral, height 6\' 1"  (1.854 m), weight (!) 400 lb (181.4 kg), SpO2 91 %. CONSTITUTIONAL: morbidly obese BMI 52, he has significant mobility issues and comes in a wheelchair.  He took quite a bit of time to move from the wheelchair to the exam table needed 2 persons to assist him EYES: Pupils are equal, round, , Sclera are non-icteric. EARS, NOSE, MOUTH AND THROAT: He is wearing a mask Hearing is intact to voice. LYMPH NODES:  Lymph nodes in the neck are normal. RESPIRATORY:  Lungs are clear. There is normal respiratory effort, with equal breath sounds bilaterally, and without pathologic use of accessory muscles. CARDIOVASCULAR: Heart is regular without murmurs, gallops, or rubs. GI: The abdomen is  soft, nontender, and nondistended. There are no palpable masses. There is no hepatosplenomegaly. There are normal bowel sounds in all  quadrants. GU: There is evidence of a scrotal wound measuring 5 x 4 cm.  There is good granulation tissue the testicle is exposed there is no evidence of necrosis there is no evidence of further necrotizing infection.  Testicle seems to be viable PENIS and other testicle without any other lesions.  MUSCULOSKELETAL: Normal muscle strength and tone. No cyanosis or edema.   SKIN: Turgor is good and there are no pathologic skin lesions or ulcers. NEUROLOGIC: Motor and sensation is grossly normal. Cranial nerves are grossly intact. PSYCH:  Oriented to person, place and time. Affect is normal.  Data Reviewed  I have personally reviewed the patient's imaging, laboratory findings and medical records.    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 He is status post multiple debridements and currently infection is under control.  He does  have a chronic wound to the scrotum that seems to impair his quality of life.  There is no evidence of further necrotizing infection there is no evidence of other surgical issues.  I do think that he would be better evaluated by plastics and potential flap versus skin graft might be performed. I have discussed the situation in detail with Dr. Richardo Hanks and he will be happy to see him again and help him navigate this complex process. Also discussed with him that an alternative was to let this wound heal by secondary intention.  Right now is not infected and is not necessarily a risk for further infections. This might actually be a feasible option    Time spent with the patient was 45 minutes, with more than 50% of the time spent in face-to-face education, counseling and care coordination.     Sterling Big, MD FACS General Surgeon 10/11/2021, 1:28 PM

## 2021-10-12 ENCOUNTER — Other Ambulatory Visit: Payer: Self-pay | Admitting: *Deleted

## 2021-10-12 NOTE — Patient Outreach (Signed)
[facility]) Care Management  [facility] Care Manager  10/12/2021   Kyan Yurkovich Narayanan Nov 03, 1951 [id number]    Outreach attempt #4, successful. Denies any urgent concerns, encouraged to contact this care manager with questions.    Encounter Medications:  Outpatient Encounter Medications as of 10/12/2021  Medication Sig   albuterol (VENTOLIN HFA) 108 (90 Base) MCG/ACT inhaler Inhale 2 puffs into the lungs every 6 (six) hours as needed for wheezing or shortness of breath.   apixaban (ELIQUIS) 5 MG TABS tablet Take 1 tablet (5 mg total) by mouth 2 (two) times daily.   famotidine (PEPCID) 20 MG tablet Take 1 tablet (20 mg total) by mouth 2 (two) times daily.   iron polysaccharides (NIFEREX) 150 MG capsule Take 1 capsule (150 mg total) by mouth daily.   metoprolol succinate (TOPROL-XL) 50 MG 24 hr tablet Take 1 tablet by mouth once daily   phenylephrine-shark liver oil-mineral oil-petrolatum (PREPARATION H) 0.25-14-74.9 % rectal ointment Place 1 application rectally 2 (two) times daily as needed for hemorrhoids.   rosuvastatin (CRESTOR) 20 MG tablet Take 1 tablet (20 mg total) by mouth daily.   senna-docusate (SENOKOT-S) 8.6-50 MG tablet Take 1 tablet by mouth 2 (two) times daily.   vitamin B-12 1000 MCG tablet Take 1 tablet (1,000 mcg total) by mouth daily.   No facility-administered encounter medications on file as of 10/12/2021.    Functional Status:  In your present state of health, do you have any difficulty performing the following activities: 06/11/2021 05/05/2021  Hearing? Y N  Vision? Y N  Difficulty concentrating or making decisions? N N  Walking or climbing stairs? Y Y  Dressing or bathing? Y Y  Doing errands, shopping? Y N  Some recent data might be hidden    Fall/Depression Screening: Fall Risk  10/09/2021 10/02/2021 06/14/2021  Falls in the past year? 0 0 1  Number falls in past yr: - - 0  Injury with Fall? 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   Taking really good care of your skin will help to keep your skin unbroken.    Notes:   7/6 - Nurse from [facility] will  be making home visits for wound care.  Son currently doing dressing changes every other day.  Confirms he is taking antibiotics for infection control  7/22 - Confirms that HH has started and wound is improving.  Has appointment with plastic surgeon on 7/28  8/24 - Report wound is just about closed.  Still have HH nursing twice a week for dressing changes.  Hoping plastic surgeon can proceed with closure plan during next visit.  9/23 - State visit with plastic surgeon was not completed as they weren't able to accommodate him with the level of care/assistance he needed during appointment.  Report they are a "no lift" facility and he has required assistance from [employer] for some mobility at times.  Per member, plastics have placed referral to Wound care center, he is unsure where the referral was placed.  This RNCM called plastics office to inquire about referral, message left, will await call back.  11/3 - Resolving due to duplicate goal     COMPLETED: [facility] CM: Make and Keep All Appointments        Timeframe:  Short-Term Goal Priority:  Medium Start Date:       7/6                      Expected End Date:        10/6 (goal extended)           Barriers: Transportation    - ask family or friend for a ride - keep a calendar with appointment dates    Why is this important?   Part of staying healthy is seeing the doctor for follow-up care.  If you forget your appointments, there are some things you can do to stay on track.    Notes:   10/05/20:  -- Discussed with patient to follow up with care providers (CHF clinic/ cardiologist) to make next appointments -- reviewed with patient recent office visits with pulmonology and PCP  11/07/20: -- encouraged patient to make prompt appointment with CHF clinic to obtain bariatric scales, as discussed during telephone call 10/05/20: confirmed patient declines need for care coordination in scheduling appointment -- confirmed ongoing reliable transportation through family members -- reviewed upcoming scheduled provider appointments   GOALS UNMET-CASE CLOSURE   7/6 - Goal reactivated as case reopened - Reviewed upcoming appointments.  Discussed concern regarding inability to get to provider offices due to decreased strength.  Encouraged to make all appointments virtual until strength has been regained.  Has virtual visits with urology on 7/11 and plastic surgeon on 7/13.  Will call PCP after urology appointment to schedule  7/22 - Canceled virtual appointment with urology.  Advised of importance due to wound and foley catheter in place.  Advised of the need to activate My Chart in effort to upload pictures and complete virtual visit.  8/24 - Report urology appointment was completed, foley catheter removed.  Has plastic surgeon appointment on 9/14 as well as vascular appointment on 9/13.  He will have CCM visit with [facility] pharmacist on 9/7, will discuss follow up appointment with PCP at that time  9/23 - Plastics appointment not completed,  awaiting call from wound care appointment.  Reviewed other upcoming appointments ([facility] pharmacy on 9/30 and vascular on 10/11)  11/3 - Resolving due to duplicate goal     COMPLETED: Track and Manage Activity and Exertion-Heart Failure       Timeframe:  Long-Range Goal Priority:  High Start Date:  09/22/2021                         Expected End Date:  03/23/22                     Follow Up Date 12/23/21    - avoid heavy exercise on very hot days - drink water to stay hydrated during exercise - follow activity or exercise plan - pace activity allowing for rest    Why is this important?   Exercising is very important when managing your heart failure.  It will help your heart get stronger.    Notes:   11/3 - Resolving due to duplicate goal        Plan:  Follow-up: Patient agrees to Care Plan and Follow-up. Follow-up in 1 month(s).  [name], [name], MSN [facility] Care Management  [facility] Manager [phone]

## 2021-10-25 DIAGNOSIS — I130 Hypertensive heart and chronic kidney disease with heart failure and stage 1 through stage 4 chronic kidney disease, or unspecified chronic kidney disease: Secondary | ICD-10-CM | POA: Diagnosis not present

## 2021-10-25 DIAGNOSIS — Z7901 Long term (current) use of anticoagulants: Secondary | ICD-10-CM | POA: Diagnosis not present

## 2021-10-25 DIAGNOSIS — Z4800 Encounter for change or removal of nonsurgical wound dressing: Secondary | ICD-10-CM | POA: Diagnosis not present

## 2021-10-25 DIAGNOSIS — R532 Functional quadriplegia: Secondary | ICD-10-CM | POA: Diagnosis not present

## 2021-10-25 DIAGNOSIS — E785 Hyperlipidemia, unspecified: Secondary | ICD-10-CM | POA: Diagnosis not present

## 2021-10-25 DIAGNOSIS — Z86711 Personal history of pulmonary embolism: Secondary | ICD-10-CM | POA: Diagnosis not present

## 2021-10-25 DIAGNOSIS — J9611 Chronic respiratory failure with hypoxia: Secondary | ICD-10-CM | POA: Diagnosis not present

## 2021-10-25 DIAGNOSIS — D509 Iron deficiency anemia, unspecified: Secondary | ICD-10-CM | POA: Diagnosis not present

## 2021-10-25 DIAGNOSIS — E872 Acidosis, unspecified: Secondary | ICD-10-CM | POA: Diagnosis not present

## 2021-10-25 DIAGNOSIS — R1310 Dysphagia, unspecified: Secondary | ICD-10-CM | POA: Diagnosis not present

## 2021-10-25 DIAGNOSIS — K219 Gastro-esophageal reflux disease without esophagitis: Secondary | ICD-10-CM | POA: Diagnosis not present

## 2021-10-25 DIAGNOSIS — E871 Hypo-osmolality and hyponatremia: Secondary | ICD-10-CM | POA: Diagnosis not present

## 2021-10-25 DIAGNOSIS — J449 Chronic obstructive pulmonary disease, unspecified: Secondary | ICD-10-CM | POA: Diagnosis not present

## 2021-10-25 DIAGNOSIS — F17210 Nicotine dependence, cigarettes, uncomplicated: Secondary | ICD-10-CM | POA: Diagnosis not present

## 2021-10-25 DIAGNOSIS — I700 Atherosclerosis of aorta: Secondary | ICD-10-CM | POA: Diagnosis not present

## 2021-10-25 DIAGNOSIS — I2510 Atherosclerotic heart disease of native coronary artery without angina pectoris: Secondary | ICD-10-CM | POA: Diagnosis not present

## 2021-10-25 DIAGNOSIS — I5032 Chronic diastolic (congestive) heart failure: Secondary | ICD-10-CM | POA: Diagnosis not present

## 2021-10-25 DIAGNOSIS — J4520 Mild intermittent asthma, uncomplicated: Secondary | ICD-10-CM | POA: Diagnosis not present

## 2021-10-25 DIAGNOSIS — K5909 Other constipation: Secondary | ICD-10-CM | POA: Diagnosis not present

## 2021-10-25 DIAGNOSIS — N1831 Chronic kidney disease, stage 3a: Secondary | ICD-10-CM | POA: Diagnosis not present

## 2021-10-25 DIAGNOSIS — Z9981 Dependence on supplemental oxygen: Secondary | ICD-10-CM | POA: Diagnosis not present

## 2021-10-26 DIAGNOSIS — Z86711 Personal history of pulmonary embolism: Secondary | ICD-10-CM | POA: Diagnosis not present

## 2021-10-26 DIAGNOSIS — K5909 Other constipation: Secondary | ICD-10-CM | POA: Diagnosis not present

## 2021-10-26 DIAGNOSIS — I5032 Chronic diastolic (congestive) heart failure: Secondary | ICD-10-CM | POA: Diagnosis not present

## 2021-10-26 DIAGNOSIS — I130 Hypertensive heart and chronic kidney disease with heart failure and stage 1 through stage 4 chronic kidney disease, or unspecified chronic kidney disease: Secondary | ICD-10-CM | POA: Diagnosis not present

## 2021-10-26 DIAGNOSIS — J9611 Chronic respiratory failure with hypoxia: Secondary | ICD-10-CM | POA: Diagnosis not present

## 2021-10-26 DIAGNOSIS — Z7901 Long term (current) use of anticoagulants: Secondary | ICD-10-CM | POA: Diagnosis not present

## 2021-10-26 DIAGNOSIS — I700 Atherosclerosis of aorta: Secondary | ICD-10-CM | POA: Diagnosis not present

## 2021-10-26 DIAGNOSIS — J4520 Mild intermittent asthma, uncomplicated: Secondary | ICD-10-CM | POA: Diagnosis not present

## 2021-10-26 DIAGNOSIS — E785 Hyperlipidemia, unspecified: Secondary | ICD-10-CM | POA: Diagnosis not present

## 2021-10-26 DIAGNOSIS — E872 Acidosis, unspecified: Secondary | ICD-10-CM | POA: Diagnosis not present

## 2021-10-26 DIAGNOSIS — Z4800 Encounter for change or removal of nonsurgical wound dressing: Secondary | ICD-10-CM | POA: Diagnosis not present

## 2021-10-26 DIAGNOSIS — E871 Hypo-osmolality and hyponatremia: Secondary | ICD-10-CM | POA: Diagnosis not present

## 2021-10-26 DIAGNOSIS — K219 Gastro-esophageal reflux disease without esophagitis: Secondary | ICD-10-CM | POA: Diagnosis not present

## 2021-10-26 DIAGNOSIS — D509 Iron deficiency anemia, unspecified: Secondary | ICD-10-CM | POA: Diagnosis not present

## 2021-10-26 DIAGNOSIS — F17210 Nicotine dependence, cigarettes, uncomplicated: Secondary | ICD-10-CM | POA: Diagnosis not present

## 2021-10-26 DIAGNOSIS — N1831 Chronic kidney disease, stage 3a: Secondary | ICD-10-CM | POA: Diagnosis not present

## 2021-10-26 DIAGNOSIS — Z9981 Dependence on supplemental oxygen: Secondary | ICD-10-CM | POA: Diagnosis not present

## 2021-10-26 DIAGNOSIS — J449 Chronic obstructive pulmonary disease, unspecified: Secondary | ICD-10-CM | POA: Diagnosis not present

## 2021-10-26 DIAGNOSIS — R532 Functional quadriplegia: Secondary | ICD-10-CM | POA: Diagnosis not present

## 2021-10-26 DIAGNOSIS — R1310 Dysphagia, unspecified: Secondary | ICD-10-CM | POA: Diagnosis not present

## 2021-10-26 DIAGNOSIS — I2510 Atherosclerotic heart disease of native coronary artery without angina pectoris: Secondary | ICD-10-CM | POA: Diagnosis not present

## 2021-10-30 DIAGNOSIS — I2510 Atherosclerotic heart disease of native coronary artery without angina pectoris: Secondary | ICD-10-CM | POA: Diagnosis not present

## 2021-10-30 DIAGNOSIS — I700 Atherosclerosis of aorta: Secondary | ICD-10-CM | POA: Diagnosis not present

## 2021-10-30 DIAGNOSIS — J4520 Mild intermittent asthma, uncomplicated: Secondary | ICD-10-CM | POA: Diagnosis not present

## 2021-10-30 DIAGNOSIS — R532 Functional quadriplegia: Secondary | ICD-10-CM | POA: Diagnosis not present

## 2021-10-30 DIAGNOSIS — Z7901 Long term (current) use of anticoagulants: Secondary | ICD-10-CM | POA: Diagnosis not present

## 2021-10-30 DIAGNOSIS — I130 Hypertensive heart and chronic kidney disease with heart failure and stage 1 through stage 4 chronic kidney disease, or unspecified chronic kidney disease: Secondary | ICD-10-CM | POA: Diagnosis not present

## 2021-10-30 DIAGNOSIS — I5032 Chronic diastolic (congestive) heart failure: Secondary | ICD-10-CM | POA: Diagnosis not present

## 2021-10-30 DIAGNOSIS — K219 Gastro-esophageal reflux disease without esophagitis: Secondary | ICD-10-CM | POA: Diagnosis not present

## 2021-10-30 DIAGNOSIS — E871 Hypo-osmolality and hyponatremia: Secondary | ICD-10-CM | POA: Diagnosis not present

## 2021-10-30 DIAGNOSIS — N1831 Chronic kidney disease, stage 3a: Secondary | ICD-10-CM | POA: Diagnosis not present

## 2021-10-30 DIAGNOSIS — R1310 Dysphagia, unspecified: Secondary | ICD-10-CM | POA: Diagnosis not present

## 2021-10-30 DIAGNOSIS — Z4800 Encounter for change or removal of nonsurgical wound dressing: Secondary | ICD-10-CM | POA: Diagnosis not present

## 2021-10-30 DIAGNOSIS — E785 Hyperlipidemia, unspecified: Secondary | ICD-10-CM | POA: Diagnosis not present

## 2021-10-30 DIAGNOSIS — J449 Chronic obstructive pulmonary disease, unspecified: Secondary | ICD-10-CM | POA: Diagnosis not present

## 2021-10-30 DIAGNOSIS — F17210 Nicotine dependence, cigarettes, uncomplicated: Secondary | ICD-10-CM | POA: Diagnosis not present

## 2021-10-30 DIAGNOSIS — K5909 Other constipation: Secondary | ICD-10-CM | POA: Diagnosis not present

## 2021-10-30 DIAGNOSIS — Z86711 Personal history of pulmonary embolism: Secondary | ICD-10-CM | POA: Diagnosis not present

## 2021-10-30 DIAGNOSIS — D509 Iron deficiency anemia, unspecified: Secondary | ICD-10-CM | POA: Diagnosis not present

## 2021-10-30 DIAGNOSIS — J9611 Chronic respiratory failure with hypoxia: Secondary | ICD-10-CM | POA: Diagnosis not present

## 2021-10-30 DIAGNOSIS — E872 Acidosis, unspecified: Secondary | ICD-10-CM | POA: Diagnosis not present

## 2021-10-30 DIAGNOSIS — Z9981 Dependence on supplemental oxygen: Secondary | ICD-10-CM | POA: Diagnosis not present

## 2021-11-01 DIAGNOSIS — J449 Chronic obstructive pulmonary disease, unspecified: Secondary | ICD-10-CM | POA: Diagnosis not present

## 2021-11-03 DIAGNOSIS — Z9981 Dependence on supplemental oxygen: Secondary | ICD-10-CM | POA: Diagnosis not present

## 2021-11-03 DIAGNOSIS — I5032 Chronic diastolic (congestive) heart failure: Secondary | ICD-10-CM | POA: Diagnosis not present

## 2021-11-03 DIAGNOSIS — Z86711 Personal history of pulmonary embolism: Secondary | ICD-10-CM | POA: Diagnosis not present

## 2021-11-03 DIAGNOSIS — J9611 Chronic respiratory failure with hypoxia: Secondary | ICD-10-CM | POA: Diagnosis not present

## 2021-11-03 DIAGNOSIS — E785 Hyperlipidemia, unspecified: Secondary | ICD-10-CM | POA: Diagnosis not present

## 2021-11-03 DIAGNOSIS — F17210 Nicotine dependence, cigarettes, uncomplicated: Secondary | ICD-10-CM | POA: Diagnosis not present

## 2021-11-03 DIAGNOSIS — J449 Chronic obstructive pulmonary disease, unspecified: Secondary | ICD-10-CM | POA: Diagnosis not present

## 2021-11-03 DIAGNOSIS — N1831 Chronic kidney disease, stage 3a: Secondary | ICD-10-CM | POA: Diagnosis not present

## 2021-11-03 DIAGNOSIS — R1310 Dysphagia, unspecified: Secondary | ICD-10-CM | POA: Diagnosis not present

## 2021-11-03 DIAGNOSIS — K5909 Other constipation: Secondary | ICD-10-CM | POA: Diagnosis not present

## 2021-11-03 DIAGNOSIS — R532 Functional quadriplegia: Secondary | ICD-10-CM | POA: Diagnosis not present

## 2021-11-03 DIAGNOSIS — I130 Hypertensive heart and chronic kidney disease with heart failure and stage 1 through stage 4 chronic kidney disease, or unspecified chronic kidney disease: Secondary | ICD-10-CM | POA: Diagnosis not present

## 2021-11-03 DIAGNOSIS — I700 Atherosclerosis of aorta: Secondary | ICD-10-CM | POA: Diagnosis not present

## 2021-11-03 DIAGNOSIS — E872 Acidosis, unspecified: Secondary | ICD-10-CM | POA: Diagnosis not present

## 2021-11-03 DIAGNOSIS — Z7901 Long term (current) use of anticoagulants: Secondary | ICD-10-CM | POA: Diagnosis not present

## 2021-11-03 DIAGNOSIS — I2510 Atherosclerotic heart disease of native coronary artery without angina pectoris: Secondary | ICD-10-CM | POA: Diagnosis not present

## 2021-11-03 DIAGNOSIS — E871 Hypo-osmolality and hyponatremia: Secondary | ICD-10-CM | POA: Diagnosis not present

## 2021-11-03 DIAGNOSIS — K219 Gastro-esophageal reflux disease without esophagitis: Secondary | ICD-10-CM | POA: Diagnosis not present

## 2021-11-03 DIAGNOSIS — D509 Iron deficiency anemia, unspecified: Secondary | ICD-10-CM | POA: Diagnosis not present

## 2021-11-03 DIAGNOSIS — J4520 Mild intermittent asthma, uncomplicated: Secondary | ICD-10-CM | POA: Diagnosis not present

## 2021-11-03 DIAGNOSIS — Z4800 Encounter for change or removal of nonsurgical wound dressing: Secondary | ICD-10-CM | POA: Diagnosis not present

## 2021-11-06 ENCOUNTER — Other Ambulatory Visit: Payer: Self-pay | Admitting: *Deleted

## 2021-11-06 NOTE — Patient Outreach (Signed)
[facility]) Care Management  11/06/2021  Thomas Tanner [birth year] [id number]   Outgoing call placed to member, no answer, unable to leave voice message.  Will follow up within the next 3-5 business days.  [name], [name], MSN [facility] Care Management  [facility] Manager [phone]

## 2021-11-08 DIAGNOSIS — F17210 Nicotine dependence, cigarettes, uncomplicated: Secondary | ICD-10-CM | POA: Diagnosis not present

## 2021-11-08 DIAGNOSIS — Z87438 Personal history of other diseases of male genital organs: Secondary | ICD-10-CM | POA: Insufficient documentation

## 2021-11-08 DIAGNOSIS — Z720 Tobacco use: Secondary | ICD-10-CM | POA: Insufficient documentation

## 2021-11-08 DIAGNOSIS — Z8673 Personal history of transient ischemic attack (TIA), and cerebral infarction without residual deficits: Secondary | ICD-10-CM | POA: Diagnosis not present

## 2021-11-08 DIAGNOSIS — S3130XA Unspecified open wound of scrotum and testes, initial encounter: Secondary | ICD-10-CM | POA: Insufficient documentation

## 2021-11-08 DIAGNOSIS — J9601 Acute respiratory failure with hypoxia: Secondary | ICD-10-CM | POA: Diagnosis not present

## 2021-11-08 DIAGNOSIS — E1169 Type 2 diabetes mellitus with other specified complication: Secondary | ICD-10-CM | POA: Insufficient documentation

## 2021-11-08 DIAGNOSIS — E785 Hyperlipidemia, unspecified: Secondary | ICD-10-CM | POA: Insufficient documentation

## 2021-11-08 DIAGNOSIS — J449 Chronic obstructive pulmonary disease, unspecified: Secondary | ICD-10-CM | POA: Diagnosis not present

## 2021-11-08 DIAGNOSIS — I5032 Chronic diastolic (congestive) heart failure: Secondary | ICD-10-CM | POA: Diagnosis not present

## 2021-11-10 DIAGNOSIS — K219 Gastro-esophageal reflux disease without esophagitis: Secondary | ICD-10-CM | POA: Diagnosis not present

## 2021-11-10 DIAGNOSIS — I5032 Chronic diastolic (congestive) heart failure: Secondary | ICD-10-CM | POA: Diagnosis not present

## 2021-11-10 DIAGNOSIS — E872 Acidosis, unspecified: Secondary | ICD-10-CM | POA: Diagnosis not present

## 2021-11-10 DIAGNOSIS — Z4800 Encounter for change or removal of nonsurgical wound dressing: Secondary | ICD-10-CM | POA: Diagnosis not present

## 2021-11-10 DIAGNOSIS — J9611 Chronic respiratory failure with hypoxia: Secondary | ICD-10-CM | POA: Diagnosis not present

## 2021-11-10 DIAGNOSIS — Z86711 Personal history of pulmonary embolism: Secondary | ICD-10-CM | POA: Diagnosis not present

## 2021-11-10 DIAGNOSIS — Z7901 Long term (current) use of anticoagulants: Secondary | ICD-10-CM | POA: Diagnosis not present

## 2021-11-10 DIAGNOSIS — I130 Hypertensive heart and chronic kidney disease with heart failure and stage 1 through stage 4 chronic kidney disease, or unspecified chronic kidney disease: Secondary | ICD-10-CM | POA: Diagnosis not present

## 2021-11-10 DIAGNOSIS — R1310 Dysphagia, unspecified: Secondary | ICD-10-CM | POA: Diagnosis not present

## 2021-11-10 DIAGNOSIS — E785 Hyperlipidemia, unspecified: Secondary | ICD-10-CM | POA: Diagnosis not present

## 2021-11-10 DIAGNOSIS — I2510 Atherosclerotic heart disease of native coronary artery without angina pectoris: Secondary | ICD-10-CM | POA: Diagnosis not present

## 2021-11-10 DIAGNOSIS — F17210 Nicotine dependence, cigarettes, uncomplicated: Secondary | ICD-10-CM | POA: Diagnosis not present

## 2021-11-10 DIAGNOSIS — K5909 Other constipation: Secondary | ICD-10-CM | POA: Diagnosis not present

## 2021-11-10 DIAGNOSIS — J449 Chronic obstructive pulmonary disease, unspecified: Secondary | ICD-10-CM | POA: Diagnosis not present

## 2021-11-10 DIAGNOSIS — Z9981 Dependence on supplemental oxygen: Secondary | ICD-10-CM | POA: Diagnosis not present

## 2021-11-10 DIAGNOSIS — E871 Hypo-osmolality and hyponatremia: Secondary | ICD-10-CM | POA: Diagnosis not present

## 2021-11-10 DIAGNOSIS — I700 Atherosclerosis of aorta: Secondary | ICD-10-CM | POA: Diagnosis not present

## 2021-11-10 DIAGNOSIS — D509 Iron deficiency anemia, unspecified: Secondary | ICD-10-CM | POA: Diagnosis not present

## 2021-11-10 DIAGNOSIS — N1831 Chronic kidney disease, stage 3a: Secondary | ICD-10-CM | POA: Diagnosis not present

## 2021-11-10 DIAGNOSIS — R532 Functional quadriplegia: Secondary | ICD-10-CM | POA: Diagnosis not present

## 2021-11-10 DIAGNOSIS — J4520 Mild intermittent asthma, uncomplicated: Secondary | ICD-10-CM | POA: Diagnosis not present

## 2021-11-13 ENCOUNTER — Other Ambulatory Visit: Payer: Self-pay | Admitting: *Deleted

## 2021-11-13 ENCOUNTER — Telehealth: Payer: Self-pay | Admitting: Physician Assistant

## 2021-11-13 DIAGNOSIS — J4520 Mild intermittent asthma, uncomplicated: Secondary | ICD-10-CM | POA: Diagnosis not present

## 2021-11-13 DIAGNOSIS — K219 Gastro-esophageal reflux disease without esophagitis: Secondary | ICD-10-CM | POA: Diagnosis not present

## 2021-11-13 DIAGNOSIS — I130 Hypertensive heart and chronic kidney disease with heart failure and stage 1 through stage 4 chronic kidney disease, or unspecified chronic kidney disease: Secondary | ICD-10-CM | POA: Diagnosis not present

## 2021-11-13 DIAGNOSIS — Z9981 Dependence on supplemental oxygen: Secondary | ICD-10-CM | POA: Diagnosis not present

## 2021-11-13 DIAGNOSIS — E871 Hypo-osmolality and hyponatremia: Secondary | ICD-10-CM | POA: Diagnosis not present

## 2021-11-13 DIAGNOSIS — J9611 Chronic respiratory failure with hypoxia: Secondary | ICD-10-CM | POA: Diagnosis not present

## 2021-11-13 DIAGNOSIS — E872 Acidosis, unspecified: Secondary | ICD-10-CM | POA: Diagnosis not present

## 2021-11-13 DIAGNOSIS — I700 Atherosclerosis of aorta: Secondary | ICD-10-CM | POA: Diagnosis not present

## 2021-11-13 DIAGNOSIS — I2510 Atherosclerotic heart disease of native coronary artery without angina pectoris: Secondary | ICD-10-CM | POA: Diagnosis not present

## 2021-11-13 DIAGNOSIS — Z7901 Long term (current) use of anticoagulants: Secondary | ICD-10-CM | POA: Diagnosis not present

## 2021-11-13 DIAGNOSIS — Z86711 Personal history of pulmonary embolism: Secondary | ICD-10-CM | POA: Diagnosis not present

## 2021-11-13 DIAGNOSIS — E785 Hyperlipidemia, unspecified: Secondary | ICD-10-CM | POA: Diagnosis not present

## 2021-11-13 DIAGNOSIS — R1310 Dysphagia, unspecified: Secondary | ICD-10-CM | POA: Diagnosis not present

## 2021-11-13 DIAGNOSIS — K5909 Other constipation: Secondary | ICD-10-CM | POA: Diagnosis not present

## 2021-11-13 DIAGNOSIS — F17210 Nicotine dependence, cigarettes, uncomplicated: Secondary | ICD-10-CM | POA: Diagnosis not present

## 2021-11-13 DIAGNOSIS — R532 Functional quadriplegia: Secondary | ICD-10-CM | POA: Diagnosis not present

## 2021-11-13 DIAGNOSIS — J449 Chronic obstructive pulmonary disease, unspecified: Secondary | ICD-10-CM | POA: Diagnosis not present

## 2021-11-13 DIAGNOSIS — I5032 Chronic diastolic (congestive) heart failure: Secondary | ICD-10-CM | POA: Diagnosis not present

## 2021-11-13 DIAGNOSIS — Z4800 Encounter for change or removal of nonsurgical wound dressing: Secondary | ICD-10-CM | POA: Diagnosis not present

## 2021-11-13 DIAGNOSIS — D509 Iron deficiency anemia, unspecified: Secondary | ICD-10-CM | POA: Diagnosis not present

## 2021-11-13 DIAGNOSIS — N1831 Chronic kidney disease, stage 3a: Secondary | ICD-10-CM | POA: Diagnosis not present

## 2021-11-13 NOTE — Telephone Encounter (Signed)
Patient contacted clinic today reporting that he needed another surgery with Dr. Richardo Tanner. He was seen on 11/08/2021 [facility]; please see my multiple prior phone notes regarding my efforts to have him seen by plastic surgery for a secondary wound closure of his exposed, viable left testicle which has been complicated by his BMI and need for a lift. Note from his Wound visit is not complete; I contacted them via telephone at [phone] and requested a call back from NP Thomas Tanner to discuss his plan of care and am awaiting a return call.

## 2021-11-13 NOTE — Patient Outreach (Signed)
[facility]) Care Management  11/13/2021  Thomas Tanner 05/14/1951 [id number]   Outreach attempt #2, unsuccessful, unable to leave voice message as mailbox is not set up.  Will send outreach letter and follow up within the next 3-5 business days.  [name], [name], MSN [facility] Care Management  [facility] Manager [phone]

## 2021-11-16 ENCOUNTER — Other Ambulatory Visit: Payer: Self-pay | Admitting: *Deleted

## 2021-11-16 ENCOUNTER — Telehealth (INDEPENDENT_AMBULATORY_CARE_PROVIDER_SITE_OTHER): Payer: Self-pay | Admitting: Vascular Surgery

## 2021-11-16 NOTE — Telephone Encounter (Signed)
Pt states that his Eliquis has gone up in price and he can't afford it  states he has been out for approximately 3 days. Wants to know if there is a substitute available that is less expensive. Please advise.

## 2021-11-16 NOTE — Telephone Encounter (Signed)
Patient was advise to contact his insurance to see which blood thinners that will be covered. Patient will contact the office with new information.

## 2021-11-16 NOTE — Patient Outreach (Signed)
[facility]) Care Management  11/16/2021  Thomas Tanner 06-Sep-1951 [id number]    Outreach attempt #3, unsuccessful, unable to leave voice message as mailbox is not set up.  Will make 4th and final attempt within the next 4 weeks, if remain unsuccessful will close case due to inability to maintain contact.  [name], [name], MSN [facility] Care Management  [facility] Manager [phone]

## 2021-11-16 NOTE — Telephone Encounter (Signed)
Please advise 

## 2021-11-17 ENCOUNTER — Ambulatory Visit: Payer: Self-pay | Admitting: *Deleted

## 2021-11-18 DIAGNOSIS — I2510 Atherosclerotic heart disease of native coronary artery without angina pectoris: Secondary | ICD-10-CM | POA: Diagnosis not present

## 2021-11-18 DIAGNOSIS — D509 Iron deficiency anemia, unspecified: Secondary | ICD-10-CM | POA: Diagnosis not present

## 2021-11-18 DIAGNOSIS — Z4800 Encounter for change or removal of nonsurgical wound dressing: Secondary | ICD-10-CM | POA: Diagnosis not present

## 2021-11-18 DIAGNOSIS — R1310 Dysphagia, unspecified: Secondary | ICD-10-CM | POA: Diagnosis not present

## 2021-11-18 DIAGNOSIS — F17210 Nicotine dependence, cigarettes, uncomplicated: Secondary | ICD-10-CM | POA: Diagnosis not present

## 2021-11-18 DIAGNOSIS — I5032 Chronic diastolic (congestive) heart failure: Secondary | ICD-10-CM | POA: Diagnosis not present

## 2021-11-18 DIAGNOSIS — J4520 Mild intermittent asthma, uncomplicated: Secondary | ICD-10-CM | POA: Diagnosis not present

## 2021-11-18 DIAGNOSIS — K219 Gastro-esophageal reflux disease without esophagitis: Secondary | ICD-10-CM | POA: Diagnosis not present

## 2021-11-18 DIAGNOSIS — R532 Functional quadriplegia: Secondary | ICD-10-CM | POA: Diagnosis not present

## 2021-11-18 DIAGNOSIS — E871 Hypo-osmolality and hyponatremia: Secondary | ICD-10-CM | POA: Diagnosis not present

## 2021-11-18 DIAGNOSIS — Z9981 Dependence on supplemental oxygen: Secondary | ICD-10-CM | POA: Diagnosis not present

## 2021-11-18 DIAGNOSIS — I130 Hypertensive heart and chronic kidney disease with heart failure and stage 1 through stage 4 chronic kidney disease, or unspecified chronic kidney disease: Secondary | ICD-10-CM | POA: Diagnosis not present

## 2021-11-18 DIAGNOSIS — K5909 Other constipation: Secondary | ICD-10-CM | POA: Diagnosis not present

## 2021-11-18 DIAGNOSIS — N1831 Chronic kidney disease, stage 3a: Secondary | ICD-10-CM | POA: Diagnosis not present

## 2021-11-18 DIAGNOSIS — Z86711 Personal history of pulmonary embolism: Secondary | ICD-10-CM | POA: Diagnosis not present

## 2021-11-18 DIAGNOSIS — Z7901 Long term (current) use of anticoagulants: Secondary | ICD-10-CM | POA: Diagnosis not present

## 2021-11-18 DIAGNOSIS — I700 Atherosclerosis of aorta: Secondary | ICD-10-CM | POA: Diagnosis not present

## 2021-11-18 DIAGNOSIS — J449 Chronic obstructive pulmonary disease, unspecified: Secondary | ICD-10-CM | POA: Diagnosis not present

## 2021-11-18 DIAGNOSIS — J9611 Chronic respiratory failure with hypoxia: Secondary | ICD-10-CM | POA: Diagnosis not present

## 2021-11-18 DIAGNOSIS — E785 Hyperlipidemia, unspecified: Secondary | ICD-10-CM | POA: Diagnosis not present

## 2021-11-18 DIAGNOSIS — E872 Acidosis, unspecified: Secondary | ICD-10-CM | POA: Diagnosis not present

## 2021-11-22 DIAGNOSIS — J4520 Mild intermittent asthma, uncomplicated: Secondary | ICD-10-CM | POA: Diagnosis not present

## 2021-11-22 DIAGNOSIS — Z86711 Personal history of pulmonary embolism: Secondary | ICD-10-CM | POA: Diagnosis not present

## 2021-11-22 DIAGNOSIS — Z9981 Dependence on supplemental oxygen: Secondary | ICD-10-CM | POA: Diagnosis not present

## 2021-11-22 DIAGNOSIS — I2510 Atherosclerotic heart disease of native coronary artery without angina pectoris: Secondary | ICD-10-CM | POA: Diagnosis not present

## 2021-11-22 DIAGNOSIS — I700 Atherosclerosis of aorta: Secondary | ICD-10-CM | POA: Diagnosis not present

## 2021-11-22 DIAGNOSIS — J449 Chronic obstructive pulmonary disease, unspecified: Secondary | ICD-10-CM | POA: Diagnosis not present

## 2021-11-22 DIAGNOSIS — R1310 Dysphagia, unspecified: Secondary | ICD-10-CM | POA: Diagnosis not present

## 2021-11-22 DIAGNOSIS — K219 Gastro-esophageal reflux disease without esophagitis: Secondary | ICD-10-CM | POA: Diagnosis not present

## 2021-11-22 DIAGNOSIS — E872 Acidosis, unspecified: Secondary | ICD-10-CM | POA: Diagnosis not present

## 2021-11-22 DIAGNOSIS — N1831 Chronic kidney disease, stage 3a: Secondary | ICD-10-CM | POA: Diagnosis not present

## 2021-11-22 DIAGNOSIS — R532 Functional quadriplegia: Secondary | ICD-10-CM | POA: Diagnosis not present

## 2021-11-22 DIAGNOSIS — K5909 Other constipation: Secondary | ICD-10-CM | POA: Diagnosis not present

## 2021-11-22 DIAGNOSIS — D509 Iron deficiency anemia, unspecified: Secondary | ICD-10-CM | POA: Diagnosis not present

## 2021-11-22 DIAGNOSIS — I130 Hypertensive heart and chronic kidney disease with heart failure and stage 1 through stage 4 chronic kidney disease, or unspecified chronic kidney disease: Secondary | ICD-10-CM | POA: Diagnosis not present

## 2021-11-22 DIAGNOSIS — Z7901 Long term (current) use of anticoagulants: Secondary | ICD-10-CM | POA: Diagnosis not present

## 2021-11-22 DIAGNOSIS — J9611 Chronic respiratory failure with hypoxia: Secondary | ICD-10-CM | POA: Diagnosis not present

## 2021-11-22 DIAGNOSIS — F17210 Nicotine dependence, cigarettes, uncomplicated: Secondary | ICD-10-CM | POA: Diagnosis not present

## 2021-11-22 DIAGNOSIS — E785 Hyperlipidemia, unspecified: Secondary | ICD-10-CM | POA: Diagnosis not present

## 2021-11-22 DIAGNOSIS — I5032 Chronic diastolic (congestive) heart failure: Secondary | ICD-10-CM | POA: Diagnosis not present

## 2021-11-22 DIAGNOSIS — E871 Hypo-osmolality and hyponatremia: Secondary | ICD-10-CM | POA: Diagnosis not present

## 2021-11-22 DIAGNOSIS — Z4800 Encounter for change or removal of nonsurgical wound dressing: Secondary | ICD-10-CM | POA: Diagnosis not present

## 2021-11-24 DIAGNOSIS — I130 Hypertensive heart and chronic kidney disease with heart failure and stage 1 through stage 4 chronic kidney disease, or unspecified chronic kidney disease: Secondary | ICD-10-CM | POA: Diagnosis not present

## 2021-11-24 DIAGNOSIS — K219 Gastro-esophageal reflux disease without esophagitis: Secondary | ICD-10-CM | POA: Diagnosis not present

## 2021-11-24 DIAGNOSIS — E871 Hypo-osmolality and hyponatremia: Secondary | ICD-10-CM | POA: Diagnosis not present

## 2021-11-24 DIAGNOSIS — Z9981 Dependence on supplemental oxygen: Secondary | ICD-10-CM | POA: Diagnosis not present

## 2021-11-24 DIAGNOSIS — R1310 Dysphagia, unspecified: Secondary | ICD-10-CM | POA: Diagnosis not present

## 2021-11-24 DIAGNOSIS — F17210 Nicotine dependence, cigarettes, uncomplicated: Secondary | ICD-10-CM | POA: Diagnosis not present

## 2021-11-24 DIAGNOSIS — I5032 Chronic diastolic (congestive) heart failure: Secondary | ICD-10-CM | POA: Diagnosis not present

## 2021-11-24 DIAGNOSIS — E872 Acidosis, unspecified: Secondary | ICD-10-CM | POA: Diagnosis not present

## 2021-11-24 DIAGNOSIS — J9611 Chronic respiratory failure with hypoxia: Secondary | ICD-10-CM | POA: Diagnosis not present

## 2021-11-24 DIAGNOSIS — Z7901 Long term (current) use of anticoagulants: Secondary | ICD-10-CM | POA: Diagnosis not present

## 2021-11-24 DIAGNOSIS — I700 Atherosclerosis of aorta: Secondary | ICD-10-CM | POA: Diagnosis not present

## 2021-11-24 DIAGNOSIS — J449 Chronic obstructive pulmonary disease, unspecified: Secondary | ICD-10-CM | POA: Diagnosis not present

## 2021-11-24 DIAGNOSIS — K5909 Other constipation: Secondary | ICD-10-CM | POA: Diagnosis not present

## 2021-11-24 DIAGNOSIS — Z86711 Personal history of pulmonary embolism: Secondary | ICD-10-CM | POA: Diagnosis not present

## 2021-11-24 DIAGNOSIS — Z4800 Encounter for change or removal of nonsurgical wound dressing: Secondary | ICD-10-CM | POA: Diagnosis not present

## 2021-11-24 DIAGNOSIS — E785 Hyperlipidemia, unspecified: Secondary | ICD-10-CM | POA: Diagnosis not present

## 2021-11-24 DIAGNOSIS — D509 Iron deficiency anemia, unspecified: Secondary | ICD-10-CM | POA: Diagnosis not present

## 2021-11-24 DIAGNOSIS — J4520 Mild intermittent asthma, uncomplicated: Secondary | ICD-10-CM | POA: Diagnosis not present

## 2021-11-24 DIAGNOSIS — N1831 Chronic kidney disease, stage 3a: Secondary | ICD-10-CM | POA: Diagnosis not present

## 2021-11-24 DIAGNOSIS — R532 Functional quadriplegia: Secondary | ICD-10-CM | POA: Diagnosis not present

## 2021-11-24 DIAGNOSIS — I2510 Atherosclerotic heart disease of native coronary artery without angina pectoris: Secondary | ICD-10-CM | POA: Diagnosis not present

## 2021-11-30 DIAGNOSIS — I700 Atherosclerosis of aorta: Secondary | ICD-10-CM | POA: Diagnosis not present

## 2021-11-30 DIAGNOSIS — Z9981 Dependence on supplemental oxygen: Secondary | ICD-10-CM | POA: Diagnosis not present

## 2021-11-30 DIAGNOSIS — Z7901 Long term (current) use of anticoagulants: Secondary | ICD-10-CM | POA: Diagnosis not present

## 2021-11-30 DIAGNOSIS — Z86711 Personal history of pulmonary embolism: Secondary | ICD-10-CM | POA: Diagnosis not present

## 2021-11-30 DIAGNOSIS — R532 Functional quadriplegia: Secondary | ICD-10-CM | POA: Diagnosis not present

## 2021-11-30 DIAGNOSIS — D509 Iron deficiency anemia, unspecified: Secondary | ICD-10-CM | POA: Diagnosis not present

## 2021-11-30 DIAGNOSIS — Z4800 Encounter for change or removal of nonsurgical wound dressing: Secondary | ICD-10-CM | POA: Diagnosis not present

## 2021-11-30 DIAGNOSIS — R1310 Dysphagia, unspecified: Secondary | ICD-10-CM | POA: Diagnosis not present

## 2021-11-30 DIAGNOSIS — J449 Chronic obstructive pulmonary disease, unspecified: Secondary | ICD-10-CM | POA: Diagnosis not present

## 2021-11-30 DIAGNOSIS — K219 Gastro-esophageal reflux disease without esophagitis: Secondary | ICD-10-CM | POA: Diagnosis not present

## 2021-11-30 DIAGNOSIS — E785 Hyperlipidemia, unspecified: Secondary | ICD-10-CM | POA: Diagnosis not present

## 2021-11-30 DIAGNOSIS — J4520 Mild intermittent asthma, uncomplicated: Secondary | ICD-10-CM | POA: Diagnosis not present

## 2021-11-30 DIAGNOSIS — I5032 Chronic diastolic (congestive) heart failure: Secondary | ICD-10-CM | POA: Diagnosis not present

## 2021-11-30 DIAGNOSIS — K5909 Other constipation: Secondary | ICD-10-CM | POA: Diagnosis not present

## 2021-11-30 DIAGNOSIS — Z87442 Personal history of urinary calculi: Secondary | ICD-10-CM | POA: Diagnosis not present

## 2021-11-30 DIAGNOSIS — N1831 Chronic kidney disease, stage 3a: Secondary | ICD-10-CM | POA: Diagnosis not present

## 2021-11-30 DIAGNOSIS — F17210 Nicotine dependence, cigarettes, uncomplicated: Secondary | ICD-10-CM | POA: Diagnosis not present

## 2021-11-30 DIAGNOSIS — I2510 Atherosclerotic heart disease of native coronary artery without angina pectoris: Secondary | ICD-10-CM | POA: Diagnosis not present

## 2021-11-30 DIAGNOSIS — J9611 Chronic respiratory failure with hypoxia: Secondary | ICD-10-CM | POA: Diagnosis not present

## 2021-11-30 DIAGNOSIS — I130 Hypertensive heart and chronic kidney disease with heart failure and stage 1 through stage 4 chronic kidney disease, or unspecified chronic kidney disease: Secondary | ICD-10-CM | POA: Diagnosis not present

## 2021-11-30 DIAGNOSIS — E871 Hypo-osmolality and hyponatremia: Secondary | ICD-10-CM | POA: Diagnosis not present

## 2021-12-01 DIAGNOSIS — J449 Chronic obstructive pulmonary disease, unspecified: Secondary | ICD-10-CM | POA: Diagnosis not present

## 2021-12-05 DIAGNOSIS — I130 Hypertensive heart and chronic kidney disease with heart failure and stage 1 through stage 4 chronic kidney disease, or unspecified chronic kidney disease: Secondary | ICD-10-CM | POA: Diagnosis not present

## 2021-12-05 DIAGNOSIS — K5909 Other constipation: Secondary | ICD-10-CM | POA: Diagnosis not present

## 2021-12-05 DIAGNOSIS — K219 Gastro-esophageal reflux disease without esophagitis: Secondary | ICD-10-CM | POA: Diagnosis not present

## 2021-12-05 DIAGNOSIS — Z87442 Personal history of urinary calculi: Secondary | ICD-10-CM | POA: Diagnosis not present

## 2021-12-05 DIAGNOSIS — R1310 Dysphagia, unspecified: Secondary | ICD-10-CM | POA: Diagnosis not present

## 2021-12-05 DIAGNOSIS — J9611 Chronic respiratory failure with hypoxia: Secondary | ICD-10-CM | POA: Diagnosis not present

## 2021-12-05 DIAGNOSIS — I700 Atherosclerosis of aorta: Secondary | ICD-10-CM | POA: Diagnosis not present

## 2021-12-05 DIAGNOSIS — R532 Functional quadriplegia: Secondary | ICD-10-CM | POA: Diagnosis not present

## 2021-12-05 DIAGNOSIS — J449 Chronic obstructive pulmonary disease, unspecified: Secondary | ICD-10-CM | POA: Diagnosis not present

## 2021-12-05 DIAGNOSIS — I2510 Atherosclerotic heart disease of native coronary artery without angina pectoris: Secondary | ICD-10-CM | POA: Diagnosis not present

## 2021-12-05 DIAGNOSIS — Z9981 Dependence on supplemental oxygen: Secondary | ICD-10-CM | POA: Diagnosis not present

## 2021-12-05 DIAGNOSIS — Z86711 Personal history of pulmonary embolism: Secondary | ICD-10-CM | POA: Diagnosis not present

## 2021-12-05 DIAGNOSIS — E785 Hyperlipidemia, unspecified: Secondary | ICD-10-CM | POA: Diagnosis not present

## 2021-12-05 DIAGNOSIS — Z4800 Encounter for change or removal of nonsurgical wound dressing: Secondary | ICD-10-CM | POA: Diagnosis not present

## 2021-12-05 DIAGNOSIS — Z7901 Long term (current) use of anticoagulants: Secondary | ICD-10-CM | POA: Diagnosis not present

## 2021-12-05 DIAGNOSIS — E871 Hypo-osmolality and hyponatremia: Secondary | ICD-10-CM | POA: Diagnosis not present

## 2021-12-05 DIAGNOSIS — D509 Iron deficiency anemia, unspecified: Secondary | ICD-10-CM | POA: Diagnosis not present

## 2021-12-05 DIAGNOSIS — I5032 Chronic diastolic (congestive) heart failure: Secondary | ICD-10-CM | POA: Diagnosis not present

## 2021-12-05 DIAGNOSIS — F17210 Nicotine dependence, cigarettes, uncomplicated: Secondary | ICD-10-CM | POA: Diagnosis not present

## 2021-12-05 DIAGNOSIS — J4520 Mild intermittent asthma, uncomplicated: Secondary | ICD-10-CM | POA: Diagnosis not present

## 2021-12-05 DIAGNOSIS — N1831 Chronic kidney disease, stage 3a: Secondary | ICD-10-CM | POA: Diagnosis not present

## 2021-12-06 ENCOUNTER — Other Ambulatory Visit: Payer: Self-pay | Admitting: *Deleted

## 2021-12-06 NOTE — Patient Outreach (Signed)
[facility]) Care Management  12/06/2021  Thomas Tanner 1951/05/25 [id number]   Outreach attempt #4, unsuccessful, unable to leave voice message.  No response from member after multiple unsuccessful outreach attempts and letter sent.  Will close case at this time due to inability to maintain contact.  Will notify member and primary MD of case closure.   [name], RN, MSN, CCM [facility] Care Management  [facility] Manager [phone]

## 2021-12-08 DIAGNOSIS — J449 Chronic obstructive pulmonary disease, unspecified: Secondary | ICD-10-CM | POA: Diagnosis not present

## 2021-12-08 DIAGNOSIS — J9601 Acute respiratory failure with hypoxia: Secondary | ICD-10-CM | POA: Diagnosis not present

## 2021-12-14 ENCOUNTER — Other Ambulatory Visit: Payer: Self-pay

## 2021-12-14 ENCOUNTER — Encounter: Payer: Self-pay | Admitting: Urology

## 2021-12-14 ENCOUNTER — Ambulatory Visit: Payer: Medicare Other | Admitting: Urology

## 2021-12-14 VITALS — BP 120/71 | HR 86 | Ht 73.0 in | Wt >= 6400 oz

## 2021-12-14 DIAGNOSIS — Z87438 Personal history of other diseases of male genital organs: Secondary | ICD-10-CM | POA: Diagnosis not present

## 2021-12-14 NOTE — Progress Notes (Signed)
° °  12/14/2021 4:33 PM   Thomas Tanner Jan 21, 1951 [id number]  Reason for visit: Follow up Fournier's gangrene  HPI: Extremely comorbid 71 year old male with morbid obesity and BMI of 55, CHF, COPD, history of saddle pulmonary embolus in August 2021 on Eliquis, CAD, and hypertension who presented in June 2022 with Fournier's gangrene and underwent emergent scrotal exploration and debridement with me.  He also required cystoscopy and urethral dilation for catheter placement at that time.  He was referred to plastic surgery in [facility] for follow-up options regarding closure, however they were unable to accommodate him secondary to his weight, and it sounds like he likely no showed some of those visits as well.  He has had a small 1 inch area over the left testicle with granulation tissue that has not completely healed over with skin.  He seems to be very fixated on this, but it does not seem to be bothersome or painful, and it is not bleeding or causing other problems.  It sounds like someone told him at some point he could not do any physical activity or shower, and I reassured him that physical activity and walking would be fine, and showering also okay if he pat the area dry afterwards.  On exam there is a small 1 inch area of raw granulation tissue over the left testicle, no purulence or evidence of infection, no bleeding.  Please see photos uploaded to the media tab.  He is very frustrated with this area, and is demanding that it be "fixed."  Apparently he was referred to [facility] plastic surgery for evaluation of closure, but they refused to see him secondary to his BMI, and he was seen by a nurse practitioner at [facility] plastic surgery where they were unwilling to perform flap closure secondary to his comorbidities and ongoing smoking and risk for failure.  I had a long conversation with the patient and his nephew today who helps take care of him.  Though I certainly understand his  frustration, he is not having any acute problems from this area, and likely over time, especially if he stops smoking, this may granulate over and heal on its own.  I stressed with him at length that I am not a plastic surgeon, and reconstructive surgery is not within my realm of practice.  I am happy to discuss his case with Dr. Arita Miss who is the plastic surgeon at [facility] in [place], and review the images with him to see if he would be willing to come over to [facility] and do a joint case with me to potentially free up the scrotal skin around the left testicle and attempt closure.  Unfortunately with his comorbidities, history of PE on Eliquis, and active smoking he is at high risk for any surgical procedure and complications.  -Will discuss his case with Dr. Arita Miss of plastic surgery and review the images(media tab) to see if he thinks a joint case for closure is feasible  I spent 45 total minutes on the day of the encounter including pre-visit review of the medical record, face-to-face time with the patient, and post visit ordering of labs/imaging/tests.   Sondra Come, MD  [facility] [address] [place], [place] [address] [phone]

## 2021-12-15 DIAGNOSIS — Z86711 Personal history of pulmonary embolism: Secondary | ICD-10-CM | POA: Diagnosis not present

## 2021-12-15 DIAGNOSIS — R1310 Dysphagia, unspecified: Secondary | ICD-10-CM | POA: Diagnosis not present

## 2021-12-15 DIAGNOSIS — I130 Hypertensive heart and chronic kidney disease with heart failure and stage 1 through stage 4 chronic kidney disease, or unspecified chronic kidney disease: Secondary | ICD-10-CM | POA: Diagnosis not present

## 2021-12-15 DIAGNOSIS — Z9981 Dependence on supplemental oxygen: Secondary | ICD-10-CM | POA: Diagnosis not present

## 2021-12-15 DIAGNOSIS — Z4800 Encounter for change or removal of nonsurgical wound dressing: Secondary | ICD-10-CM | POA: Diagnosis not present

## 2021-12-15 DIAGNOSIS — D509 Iron deficiency anemia, unspecified: Secondary | ICD-10-CM | POA: Diagnosis not present

## 2021-12-15 DIAGNOSIS — R532 Functional quadriplegia: Secondary | ICD-10-CM | POA: Diagnosis not present

## 2021-12-15 DIAGNOSIS — J449 Chronic obstructive pulmonary disease, unspecified: Secondary | ICD-10-CM | POA: Diagnosis not present

## 2021-12-15 DIAGNOSIS — E785 Hyperlipidemia, unspecified: Secondary | ICD-10-CM | POA: Diagnosis not present

## 2021-12-15 DIAGNOSIS — J4520 Mild intermittent asthma, uncomplicated: Secondary | ICD-10-CM | POA: Diagnosis not present

## 2021-12-15 DIAGNOSIS — Z7901 Long term (current) use of anticoagulants: Secondary | ICD-10-CM | POA: Diagnosis not present

## 2021-12-15 DIAGNOSIS — Z87442 Personal history of urinary calculi: Secondary | ICD-10-CM | POA: Diagnosis not present

## 2021-12-15 DIAGNOSIS — I2510 Atherosclerotic heart disease of native coronary artery without angina pectoris: Secondary | ICD-10-CM | POA: Diagnosis not present

## 2021-12-15 DIAGNOSIS — N1831 Chronic kidney disease, stage 3a: Secondary | ICD-10-CM | POA: Diagnosis not present

## 2021-12-15 DIAGNOSIS — I5032 Chronic diastolic (congestive) heart failure: Secondary | ICD-10-CM | POA: Diagnosis not present

## 2021-12-15 DIAGNOSIS — J9611 Chronic respiratory failure with hypoxia: Secondary | ICD-10-CM | POA: Diagnosis not present

## 2021-12-15 DIAGNOSIS — F17210 Nicotine dependence, cigarettes, uncomplicated: Secondary | ICD-10-CM | POA: Diagnosis not present

## 2021-12-15 DIAGNOSIS — E871 Hypo-osmolality and hyponatremia: Secondary | ICD-10-CM | POA: Diagnosis not present

## 2021-12-15 DIAGNOSIS — I700 Atherosclerosis of aorta: Secondary | ICD-10-CM | POA: Diagnosis not present

## 2021-12-15 DIAGNOSIS — K5909 Other constipation: Secondary | ICD-10-CM | POA: Diagnosis not present

## 2021-12-15 DIAGNOSIS — K219 Gastro-esophageal reflux disease without esophagitis: Secondary | ICD-10-CM | POA: Diagnosis not present

## 2021-12-19 ENCOUNTER — Ambulatory Visit (INDEPENDENT_AMBULATORY_CARE_PROVIDER_SITE_OTHER): Payer: Medicare Other | Admitting: Vascular Surgery

## 2021-12-19 DIAGNOSIS — I5032 Chronic diastolic (congestive) heart failure: Secondary | ICD-10-CM | POA: Diagnosis not present

## 2021-12-19 DIAGNOSIS — J4520 Mild intermittent asthma, uncomplicated: Secondary | ICD-10-CM | POA: Diagnosis not present

## 2021-12-19 DIAGNOSIS — Z86711 Personal history of pulmonary embolism: Secondary | ICD-10-CM | POA: Diagnosis not present

## 2021-12-19 DIAGNOSIS — D509 Iron deficiency anemia, unspecified: Secondary | ICD-10-CM | POA: Diagnosis not present

## 2021-12-19 DIAGNOSIS — R532 Functional quadriplegia: Secondary | ICD-10-CM | POA: Diagnosis not present

## 2021-12-19 DIAGNOSIS — R1310 Dysphagia, unspecified: Secondary | ICD-10-CM | POA: Diagnosis not present

## 2021-12-19 DIAGNOSIS — E871 Hypo-osmolality and hyponatremia: Secondary | ICD-10-CM | POA: Diagnosis not present

## 2021-12-19 DIAGNOSIS — J9611 Chronic respiratory failure with hypoxia: Secondary | ICD-10-CM | POA: Diagnosis not present

## 2021-12-19 DIAGNOSIS — Z4800 Encounter for change or removal of nonsurgical wound dressing: Secondary | ICD-10-CM | POA: Diagnosis not present

## 2021-12-19 DIAGNOSIS — Z9981 Dependence on supplemental oxygen: Secondary | ICD-10-CM | POA: Diagnosis not present

## 2021-12-19 DIAGNOSIS — E785 Hyperlipidemia, unspecified: Secondary | ICD-10-CM | POA: Diagnosis not present

## 2021-12-19 DIAGNOSIS — I700 Atherosclerosis of aorta: Secondary | ICD-10-CM | POA: Diagnosis not present

## 2021-12-19 DIAGNOSIS — F17210 Nicotine dependence, cigarettes, uncomplicated: Secondary | ICD-10-CM | POA: Diagnosis not present

## 2021-12-19 DIAGNOSIS — I130 Hypertensive heart and chronic kidney disease with heart failure and stage 1 through stage 4 chronic kidney disease, or unspecified chronic kidney disease: Secondary | ICD-10-CM | POA: Diagnosis not present

## 2021-12-19 DIAGNOSIS — Z87442 Personal history of urinary calculi: Secondary | ICD-10-CM | POA: Diagnosis not present

## 2021-12-19 DIAGNOSIS — I2510 Atherosclerotic heart disease of native coronary artery without angina pectoris: Secondary | ICD-10-CM | POA: Diagnosis not present

## 2021-12-19 DIAGNOSIS — N1831 Chronic kidney disease, stage 3a: Secondary | ICD-10-CM | POA: Diagnosis not present

## 2021-12-19 DIAGNOSIS — J449 Chronic obstructive pulmonary disease, unspecified: Secondary | ICD-10-CM | POA: Diagnosis not present

## 2021-12-19 DIAGNOSIS — K5909 Other constipation: Secondary | ICD-10-CM | POA: Diagnosis not present

## 2021-12-19 DIAGNOSIS — Z7901 Long term (current) use of anticoagulants: Secondary | ICD-10-CM | POA: Diagnosis not present

## 2021-12-19 DIAGNOSIS — K219 Gastro-esophageal reflux disease without esophagitis: Secondary | ICD-10-CM | POA: Diagnosis not present

## 2021-12-20 ENCOUNTER — Telehealth: Payer: Self-pay | Admitting: Student-PharmD

## 2021-12-20 NOTE — Progress Notes (Addendum)
General Review Call   BENNEY, [place] [id number] 71 years, Male  DOB: September 02, 1951  M: [phone]  General Review [facility]) Completed by Hulen Luster on 12/20/2021  Chart Review What recent interventions/DTPs have been made by any provider to improve the patient's conditions in the last 3 months?:   Consults: 10/09/21 General Surgery Pabon, Merri Ray, MD. For injury to scrotum. No medication changes.  11/08/21  [facility] [place]. Marcie Mowers, NP  For wound care. No medication changes.  12/14/21 Urology Sondra Come, MD. For wound check. No medication changes.  Any recent hospitalizations or ED visits since last visit with CPP?: No  Adherence Review Adherence rates for STAR metric medications: Rosuvastatin 20 mg 09/28/21 90 DS  Adherence rates for medications indicated for disease state being reviewed:  Rosuvastatin 20 mg 09/28/21 90 DS  Does the patient have >5 day gap between last estimated fill dates for any of the above medications?: No  Disease State Questions Able to connect with the Patient?: Yes  Did patient have any problems with their health recently?: No  Did patient have any problems with their pharmacy?: No  Does patient have any issues or side effects with their medications?: No  Additional  information to pass to Patient's CPP?: No  Anything we can do to help take better care of Patient?: No  Misc. Response/Information: Patient stated he wound/scrotum is still open, but he does have an audio visit 12/21/21 in regards of his wound and closing him up. Patient is taking Rosuvastatin.   Hulen Luster, RMA Clinical Pharmacist Assistant [phone]  Clinical Lead Review Adherence gaps identified?: No Drug Therapy Problems identified?: No Assessment: Controlled  5 minutes spent in review, coordination, and documentation.  Reviewed by: Monika Salk, PharmD Clinical Pharmacist [phone]

## 2021-12-21 ENCOUNTER — Other Ambulatory Visit: Payer: Self-pay

## 2021-12-21 ENCOUNTER — Ambulatory Visit (INDEPENDENT_AMBULATORY_CARE_PROVIDER_SITE_OTHER): Payer: Medicare Other | Admitting: Urology

## 2021-12-21 ENCOUNTER — Encounter: Payer: Self-pay | Admitting: Urology

## 2021-12-21 DIAGNOSIS — N493 Fournier gangrene: Secondary | ICD-10-CM

## 2021-12-21 NOTE — Progress Notes (Signed)
Virtual Visit via Telephone Note  I connected with Thomas Tanner on 12/21/21 at 11:45 AM EST by telephone and verified that I am speaking with the correct person using two identifiers.   Patient location: Home Provider location: [facility]rolog[facility] Office   I discussed the limitations, risks, security and privacy concerns of performing an evaluation and management service by telephone and the availability of in person appointments. We discussed the impact of the COVID-19 pandemic on the healthcare system, and the importance of social distancing and reducing patient and provider exposure. I also discussed with the patient that there may be a patient responsible charge related to this service. The patient expressed understanding and agreed to proceed.  Reason for visit: Scrotal wound  History of Present Illness: Extremely comorbid 71 year old male with morbid obesity and BMI of 55, CHF, COPD, history of saddle pulmonary embolus in August 2021 on Eliquis, CAD, and hypertension who presented in June 2022 with Fournier's gangrene and underwent emergent scrotal exploration and debridement with me.  He also required cystoscopy and urethral dilation for catheter placement at that time.   He was referred to plastic surgery in [facility] for follow-up options regarding closure, however he was unable to get out of his vehicle secondary to his weight to be seen at that appointment.   He has had a small 1 inch area over the left testicle with granulation tissue that has not completely healed over with skin.   On comparing the photos from October 2022, to my photos last week, he has had significant healing of this area and it is much smaller than previously.  With his numerous comorbidities, he was not felt to be a candidate for a skin graft or other reconstructive procedure by the plastic surgery team at [facility].  I discussed his case and reviewed the images with Dr. Arita Miss with plastic surgery in  [place], and he agrees with observation for the time being, and extremely high risk for infection/bleeding/wound breakdown with any attempt at repair or graft.  I discussed this with the patient, and recommended follow-up in 3 months for repeat wound check.  If he continues to have an open wound we can rediscuss a reconstructive procedure with plastic surgery at that time, but he has extremely high risk for any surgical intervention or reconstruction with his numerous comorbidities and morbid obesity.   Follow Up: RTC 3 months wound check   I discussed the assessment and treatment plan with the patient. The patient was provided an opportunity to ask questions and all were answered. The patient agreed with the plan and demonstrated an understanding of the instructions.   The patient was advised to call back or seek an in-person evaluation if the symptoms worsen or if the condition fails to improve as anticipated.  I provided 12 minutes of non-face-to-face time during this encounter.   Sondra Come, MD

## 2021-12-25 ENCOUNTER — Other Ambulatory Visit: Payer: Self-pay | Admitting: Internal Medicine

## 2021-12-25 DIAGNOSIS — I10 Essential (primary) hypertension: Secondary | ICD-10-CM

## 2021-12-27 ENCOUNTER — Telehealth: Payer: Self-pay

## 2021-12-27 NOTE — Telephone Encounter (Signed)
Mailed pt a pt asst application for [employer]

## 2021-12-28 ENCOUNTER — Telehealth: Payer: Self-pay | Admitting: Student-PharmD

## 2021-12-28 DIAGNOSIS — I700 Atherosclerosis of aorta: Secondary | ICD-10-CM | POA: Diagnosis not present

## 2021-12-28 DIAGNOSIS — K5909 Other constipation: Secondary | ICD-10-CM | POA: Diagnosis not present

## 2021-12-28 DIAGNOSIS — Z7901 Long term (current) use of anticoagulants: Secondary | ICD-10-CM | POA: Diagnosis not present

## 2021-12-28 DIAGNOSIS — E785 Hyperlipidemia, unspecified: Secondary | ICD-10-CM | POA: Diagnosis not present

## 2021-12-28 DIAGNOSIS — F17210 Nicotine dependence, cigarettes, uncomplicated: Secondary | ICD-10-CM | POA: Diagnosis not present

## 2021-12-28 DIAGNOSIS — I130 Hypertensive heart and chronic kidney disease with heart failure and stage 1 through stage 4 chronic kidney disease, or unspecified chronic kidney disease: Secondary | ICD-10-CM | POA: Diagnosis not present

## 2021-12-28 DIAGNOSIS — R1310 Dysphagia, unspecified: Secondary | ICD-10-CM | POA: Diagnosis not present

## 2021-12-28 DIAGNOSIS — I2510 Atherosclerotic heart disease of native coronary artery without angina pectoris: Secondary | ICD-10-CM | POA: Diagnosis not present

## 2021-12-28 DIAGNOSIS — Z86711 Personal history of pulmonary embolism: Secondary | ICD-10-CM | POA: Diagnosis not present

## 2021-12-28 DIAGNOSIS — Z4800 Encounter for change or removal of nonsurgical wound dressing: Secondary | ICD-10-CM | POA: Diagnosis not present

## 2021-12-28 DIAGNOSIS — I5032 Chronic diastolic (congestive) heart failure: Secondary | ICD-10-CM | POA: Diagnosis not present

## 2021-12-28 DIAGNOSIS — J449 Chronic obstructive pulmonary disease, unspecified: Secondary | ICD-10-CM | POA: Diagnosis not present

## 2021-12-28 DIAGNOSIS — R532 Functional quadriplegia: Secondary | ICD-10-CM | POA: Diagnosis not present

## 2021-12-28 DIAGNOSIS — N1831 Chronic kidney disease, stage 3a: Secondary | ICD-10-CM | POA: Diagnosis not present

## 2021-12-28 DIAGNOSIS — D509 Iron deficiency anemia, unspecified: Secondary | ICD-10-CM | POA: Diagnosis not present

## 2021-12-28 DIAGNOSIS — E871 Hypo-osmolality and hyponatremia: Secondary | ICD-10-CM | POA: Diagnosis not present

## 2021-12-28 DIAGNOSIS — J9611 Chronic respiratory failure with hypoxia: Secondary | ICD-10-CM | POA: Diagnosis not present

## 2021-12-28 DIAGNOSIS — Z87442 Personal history of urinary calculi: Secondary | ICD-10-CM | POA: Diagnosis not present

## 2021-12-28 DIAGNOSIS — Z9981 Dependence on supplemental oxygen: Secondary | ICD-10-CM | POA: Diagnosis not present

## 2021-12-28 DIAGNOSIS — K219 Gastro-esophageal reflux disease without esophagitis: Secondary | ICD-10-CM | POA: Diagnosis not present

## 2021-12-28 DIAGNOSIS — J4520 Mild intermittent asthma, uncomplicated: Secondary | ICD-10-CM | POA: Diagnosis not present

## 2021-12-28 NOTE — Progress Notes (Signed)
°  Chronic Care Management Pharmacy Assistant   Name: Thomas Tanner  MRN: [id number] DOB: Mar 27, 1951   Reason for Encounter: PAP  Medications: Outpatient Encounter Medications as of 12/28/2021  Medication Sig   albuterol (VENTOLIN HFA) 108 (90 Base) MCG/ACT inhaler Inhale 2 puffs into the lungs every 6 (six) hours as needed for wheezing or shortness of breath.   apixaban (ELIQUIS) 5 MG TABS tablet Take 1 tablet (5 mg total) by mouth 2 (two) times daily.   famotidine (PEPCID) 20 MG tablet Take 1 tablet (20 mg total) by mouth 2 (two) times daily.   iron polysaccharides (NIFEREX) 150 MG capsule Take 1 capsule (150 mg total) by mouth daily.   metoprolol succinate (TOPROL-XL) 50 MG 24 hr tablet Take 1 tablet by mouth once daily   phenylephrine-shark liver oil-mineral oil-petrolatum (PREPARATION H) 0.25-14-74.9 % rectal ointment Place 1 application rectally 2 (two) times daily as needed for hemorrhoids.   rosuvastatin (CRESTOR) 20 MG tablet Take 1 tablet (20 mg total) by mouth daily.   senna-docusate (SENOKOT-S) 8.6-50 MG tablet Take 1 tablet by mouth 2 (two) times daily.   vitamin B-12 1000 MCG tablet Take 1 tablet (1,000 mcg total) by mouth daily.   No facility-administered encounter medications on file as of 12/28/2021.   New patient assistance application form filled out to [employer] for [employer]. Waiting for patient and provider to complete and sign documentation. Called patient to inquire if they wanted the application mailed to them or if they wanted to come into the office. Patient is required to sign application and to bring/have proof of income. He stated he woul d be willing to come into office to bring proof of income and sign application once the paperwork comes in the mail he would like it mailed to their residence address [place] [address].  Charlann Lange, [place]  [phone]

## 2022-01-01 DIAGNOSIS — J449 Chronic obstructive pulmonary disease, unspecified: Secondary | ICD-10-CM | POA: Diagnosis not present

## 2022-01-05 DIAGNOSIS — F17210 Nicotine dependence, cigarettes, uncomplicated: Secondary | ICD-10-CM | POA: Diagnosis not present

## 2022-01-05 DIAGNOSIS — I2510 Atherosclerotic heart disease of native coronary artery without angina pectoris: Secondary | ICD-10-CM | POA: Diagnosis not present

## 2022-01-05 DIAGNOSIS — E871 Hypo-osmolality and hyponatremia: Secondary | ICD-10-CM | POA: Diagnosis not present

## 2022-01-05 DIAGNOSIS — Z86711 Personal history of pulmonary embolism: Secondary | ICD-10-CM | POA: Diagnosis not present

## 2022-01-05 DIAGNOSIS — E785 Hyperlipidemia, unspecified: Secondary | ICD-10-CM | POA: Diagnosis not present

## 2022-01-05 DIAGNOSIS — K219 Gastro-esophageal reflux disease without esophagitis: Secondary | ICD-10-CM | POA: Diagnosis not present

## 2022-01-05 DIAGNOSIS — R1310 Dysphagia, unspecified: Secondary | ICD-10-CM | POA: Diagnosis not present

## 2022-01-05 DIAGNOSIS — I700 Atherosclerosis of aorta: Secondary | ICD-10-CM | POA: Diagnosis not present

## 2022-01-05 DIAGNOSIS — Z87442 Personal history of urinary calculi: Secondary | ICD-10-CM | POA: Diagnosis not present

## 2022-01-05 DIAGNOSIS — I130 Hypertensive heart and chronic kidney disease with heart failure and stage 1 through stage 4 chronic kidney disease, or unspecified chronic kidney disease: Secondary | ICD-10-CM | POA: Diagnosis not present

## 2022-01-05 DIAGNOSIS — J9611 Chronic respiratory failure with hypoxia: Secondary | ICD-10-CM | POA: Diagnosis not present

## 2022-01-05 DIAGNOSIS — N1831 Chronic kidney disease, stage 3a: Secondary | ICD-10-CM | POA: Diagnosis not present

## 2022-01-05 DIAGNOSIS — D509 Iron deficiency anemia, unspecified: Secondary | ICD-10-CM | POA: Diagnosis not present

## 2022-01-05 DIAGNOSIS — I5032 Chronic diastolic (congestive) heart failure: Secondary | ICD-10-CM | POA: Diagnosis not present

## 2022-01-05 DIAGNOSIS — J449 Chronic obstructive pulmonary disease, unspecified: Secondary | ICD-10-CM | POA: Diagnosis not present

## 2022-01-05 DIAGNOSIS — Z4800 Encounter for change or removal of nonsurgical wound dressing: Secondary | ICD-10-CM | POA: Diagnosis not present

## 2022-01-05 DIAGNOSIS — Z7901 Long term (current) use of anticoagulants: Secondary | ICD-10-CM | POA: Diagnosis not present

## 2022-01-05 DIAGNOSIS — J4520 Mild intermittent asthma, uncomplicated: Secondary | ICD-10-CM | POA: Diagnosis not present

## 2022-01-05 DIAGNOSIS — K5909 Other constipation: Secondary | ICD-10-CM | POA: Diagnosis not present

## 2022-01-05 DIAGNOSIS — R532 Functional quadriplegia: Secondary | ICD-10-CM | POA: Diagnosis not present

## 2022-01-05 DIAGNOSIS — Z9981 Dependence on supplemental oxygen: Secondary | ICD-10-CM | POA: Diagnosis not present

## 2022-01-07 DIAGNOSIS — J441 Chronic obstructive pulmonary disease with (acute) exacerbation: Secondary | ICD-10-CM | POA: Diagnosis not present

## 2022-01-07 DIAGNOSIS — I5032 Chronic diastolic (congestive) heart failure: Secondary | ICD-10-CM | POA: Diagnosis not present

## 2022-01-07 DIAGNOSIS — I10 Essential (primary) hypertension: Secondary | ICD-10-CM | POA: Diagnosis not present

## 2022-01-08 DIAGNOSIS — J9601 Acute respiratory failure with hypoxia: Secondary | ICD-10-CM | POA: Diagnosis not present

## 2022-01-08 DIAGNOSIS — J449 Chronic obstructive pulmonary disease, unspecified: Secondary | ICD-10-CM | POA: Diagnosis not present

## 2022-01-09 ENCOUNTER — Encounter (INDEPENDENT_AMBULATORY_CARE_PROVIDER_SITE_OTHER): Payer: Self-pay

## 2022-01-09 ENCOUNTER — Ambulatory Visit (INDEPENDENT_AMBULATORY_CARE_PROVIDER_SITE_OTHER): Payer: Medicare Other | Admitting: Vascular Surgery

## 2022-01-11 DIAGNOSIS — I5032 Chronic diastolic (congestive) heart failure: Secondary | ICD-10-CM | POA: Diagnosis not present

## 2022-01-11 DIAGNOSIS — Z87442 Personal history of urinary calculi: Secondary | ICD-10-CM | POA: Diagnosis not present

## 2022-01-11 DIAGNOSIS — K219 Gastro-esophageal reflux disease without esophagitis: Secondary | ICD-10-CM | POA: Diagnosis not present

## 2022-01-11 DIAGNOSIS — K5909 Other constipation: Secondary | ICD-10-CM | POA: Diagnosis not present

## 2022-01-11 DIAGNOSIS — E785 Hyperlipidemia, unspecified: Secondary | ICD-10-CM | POA: Diagnosis not present

## 2022-01-11 DIAGNOSIS — F17210 Nicotine dependence, cigarettes, uncomplicated: Secondary | ICD-10-CM | POA: Diagnosis not present

## 2022-01-11 DIAGNOSIS — R532 Functional quadriplegia: Secondary | ICD-10-CM | POA: Diagnosis not present

## 2022-01-11 DIAGNOSIS — Z7901 Long term (current) use of anticoagulants: Secondary | ICD-10-CM | POA: Diagnosis not present

## 2022-01-11 DIAGNOSIS — I130 Hypertensive heart and chronic kidney disease with heart failure and stage 1 through stage 4 chronic kidney disease, or unspecified chronic kidney disease: Secondary | ICD-10-CM | POA: Diagnosis not present

## 2022-01-11 DIAGNOSIS — I2510 Atherosclerotic heart disease of native coronary artery without angina pectoris: Secondary | ICD-10-CM | POA: Diagnosis not present

## 2022-01-11 DIAGNOSIS — Z86711 Personal history of pulmonary embolism: Secondary | ICD-10-CM | POA: Diagnosis not present

## 2022-01-11 DIAGNOSIS — E871 Hypo-osmolality and hyponatremia: Secondary | ICD-10-CM | POA: Diagnosis not present

## 2022-01-11 DIAGNOSIS — D509 Iron deficiency anemia, unspecified: Secondary | ICD-10-CM | POA: Diagnosis not present

## 2022-01-11 DIAGNOSIS — N1831 Chronic kidney disease, stage 3a: Secondary | ICD-10-CM | POA: Diagnosis not present

## 2022-01-11 DIAGNOSIS — Z9981 Dependence on supplemental oxygen: Secondary | ICD-10-CM | POA: Diagnosis not present

## 2022-01-11 DIAGNOSIS — R1310 Dysphagia, unspecified: Secondary | ICD-10-CM | POA: Diagnosis not present

## 2022-01-11 DIAGNOSIS — Z4800 Encounter for change or removal of nonsurgical wound dressing: Secondary | ICD-10-CM | POA: Diagnosis not present

## 2022-01-11 DIAGNOSIS — J449 Chronic obstructive pulmonary disease, unspecified: Secondary | ICD-10-CM | POA: Diagnosis not present

## 2022-01-11 DIAGNOSIS — J9611 Chronic respiratory failure with hypoxia: Secondary | ICD-10-CM | POA: Diagnosis not present

## 2022-01-11 DIAGNOSIS — I700 Atherosclerosis of aorta: Secondary | ICD-10-CM | POA: Diagnosis not present

## 2022-01-11 DIAGNOSIS — J4520 Mild intermittent asthma, uncomplicated: Secondary | ICD-10-CM | POA: Diagnosis not present

## 2022-01-19 DIAGNOSIS — Z7901 Long term (current) use of anticoagulants: Secondary | ICD-10-CM | POA: Diagnosis not present

## 2022-01-19 DIAGNOSIS — F17210 Nicotine dependence, cigarettes, uncomplicated: Secondary | ICD-10-CM | POA: Diagnosis not present

## 2022-01-19 DIAGNOSIS — I2510 Atherosclerotic heart disease of native coronary artery without angina pectoris: Secondary | ICD-10-CM | POA: Diagnosis not present

## 2022-01-19 DIAGNOSIS — K5909 Other constipation: Secondary | ICD-10-CM | POA: Diagnosis not present

## 2022-01-19 DIAGNOSIS — R1310 Dysphagia, unspecified: Secondary | ICD-10-CM | POA: Diagnosis not present

## 2022-01-19 DIAGNOSIS — N1831 Chronic kidney disease, stage 3a: Secondary | ICD-10-CM | POA: Diagnosis not present

## 2022-01-19 DIAGNOSIS — I5032 Chronic diastolic (congestive) heart failure: Secondary | ICD-10-CM | POA: Diagnosis not present

## 2022-01-19 DIAGNOSIS — R532 Functional quadriplegia: Secondary | ICD-10-CM | POA: Diagnosis not present

## 2022-01-19 DIAGNOSIS — Z9981 Dependence on supplemental oxygen: Secondary | ICD-10-CM | POA: Diagnosis not present

## 2022-01-19 DIAGNOSIS — J9611 Chronic respiratory failure with hypoxia: Secondary | ICD-10-CM | POA: Diagnosis not present

## 2022-01-19 DIAGNOSIS — Z86711 Personal history of pulmonary embolism: Secondary | ICD-10-CM | POA: Diagnosis not present

## 2022-01-19 DIAGNOSIS — I130 Hypertensive heart and chronic kidney disease with heart failure and stage 1 through stage 4 chronic kidney disease, or unspecified chronic kidney disease: Secondary | ICD-10-CM | POA: Diagnosis not present

## 2022-01-19 DIAGNOSIS — Z4800 Encounter for change or removal of nonsurgical wound dressing: Secondary | ICD-10-CM | POA: Diagnosis not present

## 2022-01-19 DIAGNOSIS — J449 Chronic obstructive pulmonary disease, unspecified: Secondary | ICD-10-CM | POA: Diagnosis not present

## 2022-01-19 DIAGNOSIS — E871 Hypo-osmolality and hyponatremia: Secondary | ICD-10-CM | POA: Diagnosis not present

## 2022-01-19 DIAGNOSIS — D509 Iron deficiency anemia, unspecified: Secondary | ICD-10-CM | POA: Diagnosis not present

## 2022-01-19 DIAGNOSIS — I700 Atherosclerosis of aorta: Secondary | ICD-10-CM | POA: Diagnosis not present

## 2022-01-19 DIAGNOSIS — Z87442 Personal history of urinary calculi: Secondary | ICD-10-CM | POA: Diagnosis not present

## 2022-01-19 DIAGNOSIS — E785 Hyperlipidemia, unspecified: Secondary | ICD-10-CM | POA: Diagnosis not present

## 2022-01-19 DIAGNOSIS — J4520 Mild intermittent asthma, uncomplicated: Secondary | ICD-10-CM | POA: Diagnosis not present

## 2022-01-19 DIAGNOSIS — K219 Gastro-esophageal reflux disease without esophagitis: Secondary | ICD-10-CM | POA: Diagnosis not present

## 2022-01-23 DIAGNOSIS — J4520 Mild intermittent asthma, uncomplicated: Secondary | ICD-10-CM | POA: Diagnosis not present

## 2022-01-23 DIAGNOSIS — I130 Hypertensive heart and chronic kidney disease with heart failure and stage 1 through stage 4 chronic kidney disease, or unspecified chronic kidney disease: Secondary | ICD-10-CM | POA: Diagnosis not present

## 2022-01-23 DIAGNOSIS — R532 Functional quadriplegia: Secondary | ICD-10-CM | POA: Diagnosis not present

## 2022-01-23 DIAGNOSIS — Z87442 Personal history of urinary calculi: Secondary | ICD-10-CM | POA: Diagnosis not present

## 2022-01-23 DIAGNOSIS — I700 Atherosclerosis of aorta: Secondary | ICD-10-CM | POA: Diagnosis not present

## 2022-01-23 DIAGNOSIS — Z9981 Dependence on supplemental oxygen: Secondary | ICD-10-CM | POA: Diagnosis not present

## 2022-01-23 DIAGNOSIS — D509 Iron deficiency anemia, unspecified: Secondary | ICD-10-CM | POA: Diagnosis not present

## 2022-01-23 DIAGNOSIS — E785 Hyperlipidemia, unspecified: Secondary | ICD-10-CM | POA: Diagnosis not present

## 2022-01-23 DIAGNOSIS — K219 Gastro-esophageal reflux disease without esophagitis: Secondary | ICD-10-CM | POA: Diagnosis not present

## 2022-01-23 DIAGNOSIS — Z86711 Personal history of pulmonary embolism: Secondary | ICD-10-CM | POA: Diagnosis not present

## 2022-01-23 DIAGNOSIS — F17210 Nicotine dependence, cigarettes, uncomplicated: Secondary | ICD-10-CM | POA: Diagnosis not present

## 2022-01-23 DIAGNOSIS — E871 Hypo-osmolality and hyponatremia: Secondary | ICD-10-CM | POA: Diagnosis not present

## 2022-01-23 DIAGNOSIS — N1831 Chronic kidney disease, stage 3a: Secondary | ICD-10-CM | POA: Diagnosis not present

## 2022-01-23 DIAGNOSIS — I5032 Chronic diastolic (congestive) heart failure: Secondary | ICD-10-CM | POA: Diagnosis not present

## 2022-01-23 DIAGNOSIS — J9611 Chronic respiratory failure with hypoxia: Secondary | ICD-10-CM | POA: Diagnosis not present

## 2022-01-23 DIAGNOSIS — I2510 Atherosclerotic heart disease of native coronary artery without angina pectoris: Secondary | ICD-10-CM | POA: Diagnosis not present

## 2022-01-23 DIAGNOSIS — R1310 Dysphagia, unspecified: Secondary | ICD-10-CM | POA: Diagnosis not present

## 2022-01-23 DIAGNOSIS — J449 Chronic obstructive pulmonary disease, unspecified: Secondary | ICD-10-CM | POA: Diagnosis not present

## 2022-01-23 DIAGNOSIS — K5909 Other constipation: Secondary | ICD-10-CM | POA: Diagnosis not present

## 2022-01-23 DIAGNOSIS — Z7901 Long term (current) use of anticoagulants: Secondary | ICD-10-CM | POA: Diagnosis not present

## 2022-01-23 DIAGNOSIS — Z4800 Encounter for change or removal of nonsurgical wound dressing: Secondary | ICD-10-CM | POA: Diagnosis not present

## 2022-01-25 ENCOUNTER — Other Ambulatory Visit: Payer: Self-pay | Admitting: Internal Medicine

## 2022-01-25 DIAGNOSIS — I2510 Atherosclerotic heart disease of native coronary artery without angina pectoris: Secondary | ICD-10-CM

## 2022-02-01 DIAGNOSIS — J449 Chronic obstructive pulmonary disease, unspecified: Secondary | ICD-10-CM | POA: Diagnosis not present

## 2022-02-06 DIAGNOSIS — J449 Chronic obstructive pulmonary disease, unspecified: Secondary | ICD-10-CM | POA: Diagnosis not present

## 2022-02-06 DIAGNOSIS — J9601 Acute respiratory failure with hypoxia: Secondary | ICD-10-CM | POA: Diagnosis not present

## 2022-02-26 ENCOUNTER — Ambulatory Visit: Payer: Medicare Other | Admitting: Nurse Practitioner

## 2022-03-01 DIAGNOSIS — J449 Chronic obstructive pulmonary disease, unspecified: Secondary | ICD-10-CM | POA: Diagnosis not present

## 2022-03-08 DIAGNOSIS — J449 Chronic obstructive pulmonary disease, unspecified: Secondary | ICD-10-CM | POA: Diagnosis not present

## 2022-03-08 DIAGNOSIS — J9601 Acute respiratory failure with hypoxia: Secondary | ICD-10-CM | POA: Diagnosis not present

## 2022-03-28 ENCOUNTER — Telehealth: Payer: Medicare Other

## 2022-03-29 ENCOUNTER — Ambulatory Visit: Payer: Medicare Other | Admitting: Nurse Practitioner

## 2022-04-01 DIAGNOSIS — J449 Chronic obstructive pulmonary disease, unspecified: Secondary | ICD-10-CM | POA: Diagnosis not present

## 2022-04-05 ENCOUNTER — Ambulatory Visit: Payer: Medicare Other | Admitting: Urology

## 2022-04-08 DIAGNOSIS — J449 Chronic obstructive pulmonary disease, unspecified: Secondary | ICD-10-CM | POA: Diagnosis not present

## 2022-04-08 DIAGNOSIS — J9601 Acute respiratory failure with hypoxia: Secondary | ICD-10-CM | POA: Diagnosis not present

## 2022-04-11 ENCOUNTER — Other Ambulatory Visit: Payer: Self-pay | Admitting: Nurse Practitioner

## 2022-04-11 DIAGNOSIS — I10 Essential (primary) hypertension: Secondary | ICD-10-CM

## 2022-04-13 ENCOUNTER — Encounter: Payer: Self-pay | Admitting: Physician Assistant

## 2022-04-13 ENCOUNTER — Ambulatory Visit (INDEPENDENT_AMBULATORY_CARE_PROVIDER_SITE_OTHER): Payer: Medicare Other | Admitting: Physician Assistant

## 2022-04-13 VITALS — BP 125/75 | HR 89 | Ht 73.0 in | Wt >= 6400 oz

## 2022-04-13 DIAGNOSIS — Z87438 Personal history of other diseases of male genital organs: Secondary | ICD-10-CM

## 2022-04-13 NOTE — Progress Notes (Signed)
? ?04/13/2022 ?10:59 AM  ? ?Thomas Tanner ?07/27/1951 ?[id number] ? ?CC: ?Chief Complaint  ?Patient presents with  ? Follow-up  ?  Wound check  ? ?HPI: ?Thomas Tanner is a 71 y.o. extremely comorbid male with a history of Fournier's gangrene in June 2022 who never underwent secondary scrotal closure who presents today for wound check.  ? ?Today he reports no acute concerns.  He states he occasionally has some scrotal discomfort but thinks that his wound has healed. ? ?PMH: ?Past Medical History:  ?Diagnosis Date  ? Arthritis   ? CHF (congestive heart failure) (HCC)   ? COPD (chronic obstructive pulmonary disease) (HCC)   ? Diabetes (HCC)   ? Hyperlipidemia   ? Hypertension   ? Kidney stone   ? Obesity   ? Tachycardia   ? TIA (transient ischemic attack)   ? ? ?Surgical History: ?Past Surgical History:  ?Procedure Laterality Date  ? CORONARY/GRAFT ACUTE MI REVASCULARIZATION N/A 12/08/2018  ? Procedure: Coronary/Graft Acute MI Revascularization;  Surgeon: Alwyn Pea, MD;  Location: [facility];  Service: Cardiovascular;  Laterality: N/A;  ? INCISION AND DRAINAGE ABSCESS N/A 05/18/2021  ? Procedure: INCISION AND DRAINAGE SCROTAL ABSCESS, cystoscopy with urethral dilation, and complex catheter placement;  Surgeon: Sondra Come, MD;  Location: [facility];  Service: Urology;  Laterality: N/A;  ? IRRIGATION AND DEBRIDEMENT ABSCESS N/A 05/20/2021  ? Procedure: IRRIGATION AND DEBRIDEMENT ABSCESS;  Surgeon: Riki Altes, MD;  Location: [facility];  Service: Urology;  Laterality: N/A;  ? IRRIGATION AND DEBRIDEMENT ABSCESS N/A 05/21/2021  ? Procedure: IRRIGATION AND DEBRIDEMENT ABSCESS;  Surgeon: Riki Altes, MD;  Location: [facility];  Service: Urology;  Laterality: N/A;  ? KIDNEY STONE SURGERY    ? left arm surgery  1963  ? LEFT HEART CATH AND CORONARY ANGIOGRAPHY N/A 12/08/2018  ? Procedure: LEFT HEART CATH AND CORONARY ANGIOGRAPHY;  Surgeon: Alwyn Pea, MD;  Location: [facility];  Service: Cardiovascular;  Laterality: N/A;  ? PULMONARY THROMBECTOMY N/A 08/03/2020  ? Procedure: PULMONARY THROMBECTOMY / THROMBOLYSIS;  Surgeon: Annice Needy, MD;  Location: [facility];  Service: Cardiovascular;  Laterality: N/A;  ? RECTAL EXAM UNDER ANESTHESIA N/A 05/20/2021  ? Procedure: EXAM UNDER ANESTHESIA-Dressing and Change;  Surgeon: Riki Altes, MD;  Location: [facility];  Service: Urology;  Laterality: N/A;  ? ? ?Home Medications:  ?Allergies as of 04/13/2022   ? ?   Reactions  ? Morphine Anxiety, Other (See Comments)  ? Hallucinations ?Pt states 6.17.22 that he is not allergic to this medication  ? Morphine And Related Anxiety  ? ?  ? ?  ?Medication List  ?  ? ?  ? Accurate as of Apr 13, 2022 10:59 AM. If you have any questions, ask your nurse or doctor.  ?  ?  ? ?  ? ?albuterol 108 (90 Base) MCG/ACT inhaler ?Commonly known as: Ventolin HFA ?Inhale 2 puffs into the lungs every 6 (six) hours as needed for wheezing or shortness of breath. ?  ?apixaban 5 MG Tabs tablet ?Commonly known as: ELIQUIS ?Take 1 tablet (5 mg total) by mouth 2 (two) times daily. ?  ?cyanocobalamin 1000 MCG tablet ?Take 1 tablet (1,000 mcg total) by mouth daily. ?  ?famotidine 20 MG tablet ?Commonly known as: PEPCID ?Take 1 tablet (20 mg total) by mouth 2 (two) times daily. ?  ?iron polysaccharides 150 MG capsule ?Commonly known as: NIFEREX ?Take 1 capsule (150  mg total) by mouth daily. ?  ?metoprolol succinate 50 MG 24 hr tablet ?Commonly known as: TOPROL-XL ?Take 1 tablet by mouth once daily ?  ?phenylephrine-shark liver oil-mineral oil-petrolatum 0.25-14-74.9 % rectal ointment ?Commonly known as: PREPARATION H ?Place 1 application rectally 2 (two) times daily as needed for hemorrhoids. ?  ?rosuvastatin 20 MG tablet ?Commonly known as: CRESTOR ?TAKE 1 TABLET BY MOUTH ONCE DAILY. DISCONTINUE ATORVASTATIN DUE TO NEGATIVE SIDE EFFECTS. ?  ?senna-docusate 8.6-50 MG tablet ?Commonly known as: Senokot-S ?Take 1  tablet by mouth 2 (two) times daily. ?  ? ?  ? ? ?Allergies:  ?Allergies  ?Allergen Reactions  ? Morphine Anxiety and Other (See Comments)  ?  Hallucinations ? ?Pt states 6.17.22 that he is not allergic to this medication  ? Morphine And Related Anxiety  ? ? ?Family History: ?Family History  ?Problem Relation Age of Onset  ? Alzheimer's disease Mother   ? CAD Father   ? ? ?Social History:  ? reports that he has been smoking cigarettes. He has been smoking an average of .5 packs per day. He has been exposed to tobacco smoke. He has never used smokeless tobacco. He reports that he does not drink alcohol and does not use drugs. ? ?Physical Exam: ?BP 125/75   Pulse 89   Ht 6\' 1"  (1.854 m)   Wt (!) 416 lb (188.7 kg)   BMI 54.88 kg/m?   ?Constitutional:  Alert and oriented, no acute distress, nontoxic appearing ?HEENT: [place], AT ?Cardiovascular: No clubbing, cyanosis, or edema ?Respiratory: Normal respiratory effort, no increased work of breathing ?GU: Bilateral descended testicles.  Interval healing of his left scrotal wound.  The inferior contour of the testicle is still visible, however it appears to have epithelialized.  The scrotum is well adhered and the wound overall has decreased in size, now measuring approximately 4 cm in diameter. ?Skin: No rashes, bruises or suspicious lesions ?Neurologic: Grossly intact, no focal deficits, moving all 4 extremities ?Psychiatric: Normal mood and affect ? ?Assessment & Plan:   ?1. History of Fournier's gangrene ?Wound examined in clinic today, photo taken for his chart.  There has been significant improvement in wound healing since his last clinic visit.  While the scrotum has not reapproximated, it appears that the inferior portion of the testicle that is exposed has epithelialized.  We discussed that based on these findings, I do not think he requires any further surgical intervention for wound closure.  He is in agreement with this plan. ? ?Return in about 6 months (around  10/14/2022) for Wound check. ? ?13/04/2022, PA-C ? ?[facility] ?[address], [address] ?[place], [place] ?[phone]   ?

## 2022-04-17 ENCOUNTER — Ambulatory Visit: Payer: Medicare Other | Admitting: Nurse Practitioner

## 2022-05-01 DIAGNOSIS — J449 Chronic obstructive pulmonary disease, unspecified: Secondary | ICD-10-CM | POA: Diagnosis not present

## 2022-05-02 ENCOUNTER — Ambulatory Visit: Payer: Medicare Other | Admitting: Nurse Practitioner

## 2022-05-08 DIAGNOSIS — J9601 Acute respiratory failure with hypoxia: Secondary | ICD-10-CM | POA: Diagnosis not present

## 2022-05-08 DIAGNOSIS — J449 Chronic obstructive pulmonary disease, unspecified: Secondary | ICD-10-CM | POA: Diagnosis not present

## 2022-05-14 ENCOUNTER — Other Ambulatory Visit: Payer: Self-pay | Admitting: Nurse Practitioner

## 2022-05-14 DIAGNOSIS — I10 Essential (primary) hypertension: Secondary | ICD-10-CM

## 2022-05-14 DIAGNOSIS — I2510 Atherosclerotic heart disease of native coronary artery without angina pectoris: Secondary | ICD-10-CM

## 2022-05-17 ENCOUNTER — Telehealth: Payer: Self-pay

## 2022-05-17 NOTE — Telephone Encounter (Signed)
No answer, sent mychart message to confirm 05/23/22 appointment-Toni

## 2022-05-23 ENCOUNTER — Ambulatory Visit: Payer: Medicare Other | Admitting: Nurse Practitioner

## 2022-06-01 DIAGNOSIS — J449 Chronic obstructive pulmonary disease, unspecified: Secondary | ICD-10-CM | POA: Diagnosis not present

## 2022-06-08 DIAGNOSIS — J449 Chronic obstructive pulmonary disease, unspecified: Secondary | ICD-10-CM | POA: Diagnosis not present

## 2022-06-08 DIAGNOSIS — J9601 Acute respiratory failure with hypoxia: Secondary | ICD-10-CM | POA: Diagnosis not present

## 2022-06-18 ENCOUNTER — Other Ambulatory Visit: Payer: Self-pay

## 2022-06-18 DIAGNOSIS — J4520 Mild intermittent asthma, uncomplicated: Secondary | ICD-10-CM

## 2022-06-18 MED ORDER — ALBUTEROL SULFATE HFA 108 (90 BASE) MCG/ACT IN AERS: 2.0000 | INHALATION_SPRAY | Freq: Four times a day (QID) | RESPIRATORY_TRACT | 0 refills | Status: DC | PRN

## 2022-07-01 DIAGNOSIS — J449 Chronic obstructive pulmonary disease, unspecified: Secondary | ICD-10-CM | POA: Diagnosis not present

## 2022-07-02 ENCOUNTER — Ambulatory Visit: Payer: Medicare Other | Admitting: Nurse Practitioner

## 2022-07-08 DIAGNOSIS — J9601 Acute respiratory failure with hypoxia: Secondary | ICD-10-CM | POA: Diagnosis not present

## 2022-07-08 DIAGNOSIS — J449 Chronic obstructive pulmonary disease, unspecified: Secondary | ICD-10-CM | POA: Diagnosis not present

## 2022-07-17 ENCOUNTER — Ambulatory Visit (INDEPENDENT_AMBULATORY_CARE_PROVIDER_SITE_OTHER): Payer: Medicare Other | Admitting: Nurse Practitioner

## 2022-07-17 ENCOUNTER — Telehealth: Payer: Self-pay

## 2022-07-17 ENCOUNTER — Encounter: Payer: Self-pay | Admitting: Nurse Practitioner

## 2022-07-17 VITALS — BP 117/99 | HR 94 | Temp 97.9°F | Resp 16 | Ht 73.0 in

## 2022-07-17 DIAGNOSIS — E782 Mixed hyperlipidemia: Secondary | ICD-10-CM

## 2022-07-17 DIAGNOSIS — Z760 Encounter for issue of repeat prescription: Secondary | ICD-10-CM

## 2022-07-17 DIAGNOSIS — J9611 Chronic respiratory failure with hypoxia: Secondary | ICD-10-CM

## 2022-07-17 DIAGNOSIS — E559 Vitamin D deficiency, unspecified: Secondary | ICD-10-CM | POA: Diagnosis not present

## 2022-07-17 DIAGNOSIS — I10 Essential (primary) hypertension: Secondary | ICD-10-CM

## 2022-07-17 DIAGNOSIS — J4520 Mild intermittent asthma, uncomplicated: Secondary | ICD-10-CM

## 2022-07-17 DIAGNOSIS — I700 Atherosclerosis of aorta: Secondary | ICD-10-CM

## 2022-07-17 DIAGNOSIS — R7301 Impaired fasting glucose: Secondary | ICD-10-CM | POA: Diagnosis not present

## 2022-07-17 DIAGNOSIS — I2510 Atherosclerotic heart disease of native coronary artery without angina pectoris: Secondary | ICD-10-CM

## 2022-07-17 DIAGNOSIS — K219 Gastro-esophageal reflux disease without esophagitis: Secondary | ICD-10-CM

## 2022-07-17 DIAGNOSIS — I5032 Chronic diastolic (congestive) heart failure: Secondary | ICD-10-CM

## 2022-07-17 DIAGNOSIS — Z0001 Encounter for general adult medical examination with abnormal findings: Secondary | ICD-10-CM

## 2022-07-17 DIAGNOSIS — Z0000 Encounter for general adult medical examination without abnormal findings: Secondary | ICD-10-CM

## 2022-07-17 DIAGNOSIS — R300 Dysuria: Secondary | ICD-10-CM

## 2022-07-17 MED ORDER — ROSUVASTATIN CALCIUM 20 MG PO TABS: 20.0000 mg | ORAL_TABLET | Freq: Every day | ORAL | 3 refills | Status: DC

## 2022-07-17 MED ORDER — FAMOTIDINE 20 MG PO TABS: 20.0000 mg | ORAL_TABLET | Freq: Two times a day (BID) | ORAL | 1 refills | Status: DC

## 2022-07-17 MED ORDER — ALBUTEROL SULFATE HFA 108 (90 BASE) MCG/ACT IN AERS: 2.0000 | INHALATION_SPRAY | Freq: Four times a day (QID) | RESPIRATORY_TRACT | 6 refills | Status: DC | PRN

## 2022-07-17 MED ORDER — METOPROLOL SUCCINATE ER 50 MG PO TB24: 50.0000 mg | ORAL_TABLET | Freq: Every day | ORAL | 3 refills | Status: DC

## 2022-07-17 MED ORDER — LANSOPRAZOLE 30 MG PO CPDR: 30.0000 mg | DELAYED_RELEASE_CAPSULE | Freq: Every day | ORAL | 1 refills | Status: DC

## 2022-07-17 MED ORDER — MELOXICAM 7.5 MG PO TABS: 7.5000 mg | ORAL_TABLET | Freq: Every day | ORAL | 3 refills | Status: DC

## 2022-07-17 MED ORDER — ALPRAZOLAM 0.25 MG PO TABS: 0.2500 mg | ORAL_TABLET | Freq: Every day | ORAL | 0 refills | Status: DC | PRN

## 2022-07-17 NOTE — Progress Notes (Signed)
[facility] [place], [place] [address]  Internal MEDICINE  Office Visit Note  Patient Name: Thomas Tanner  [id number]  [id number]  Date of Service: 07/17/2022  Chief Complaint  Patient presents with   Medicare Wellness   Diabetes   Hypertension   Hyperlipidemia    HPI Thomas Tanner presents for an annual well visit and physical exam.  He is a 71 year old male with hypertension, chronic diastolic CHF, aortic atherosclerosis, chronic respiratory failure with hypoxia, COPD, GERD, chronic back pain with sciatica.  --Functional quadriplegia, patient is wheelchair-bound but is able to perform some ADLs on his own or with minimal assistance.  His wheelchair is old and does not fit him well.  He also reports that he feels his buttocks going numb when he is sitting in his wheelchair for long periods of time. --Reports severe anxiety anytime he has to leave the house and go to a doctor's appointment or other place.  He has a history of canceling many appointments in the past 12 months due to this severe anxiety.  He is most anxious about falling and not having anybody who is able to pick him up. --At his previous office visit in the summer 2022, the patient had an open wound to the scrotum with one of his testicles completely exposed he was referred to general surgery who reached out to his urologist and after several months, the scrotal wound healed by secondary intention He is no longer on Eliquis, and wants to restart his meloxicam which helped to alleviate some of his arthritis pain --Patient takes Pepcid 20 mg twice daily for GERD but this is not helping enough. --Needs several medication refills -- morbidly obese; refused to weigh today.  most recent weight: 416 pounds.   ----very upset about his weight, states that he feels embarrassed and this is the largest he has ever been.  He is mentally motivated to lose weight.   -- requesting assistance with weight loss  management. --due for routine labs    Current Medication: Outpatient Encounter Medications as of 07/17/2022  Medication Sig   ALPRAZolam (XANAX) 0.25 MG tablet Take 1 tablet (0.25 mg total) by mouth daily as needed (severe anxiety). May take 1 additional dose after 2 hours if the first dose is not effective.   iron polysaccharides (NIFEREX) 150 MG capsule Take 1 capsule (150 mg total) by mouth daily.   lansoprazole (PREVACID) 30 MG capsule Take 1 capsule (30 mg total) by mouth daily before breakfast.   meloxicam (MOBIC) 7.5 MG tablet Take 1 tablet (7.5 mg total) by mouth daily.   phenylephrine-shark liver oil-mineral oil-petrolatum (PREPARATION H) 0.25-14-74.9 % rectal ointment Place 1 application rectally 2 (two) times daily as needed for hemorrhoids.   senna-docusate (SENOKOT-S) 8.6-50 MG tablet Take 1 tablet by mouth 2 (two) times daily.   vitamin B-12 1000 MCG tablet Take 1 tablet (1,000 mcg total) by mouth daily.   [DISCONTINUED] albuterol (VENTOLIN HFA) 108 (90 Base) MCG/ACT inhaler Inhale 2 puffs into the lungs every 6 (six) hours as needed for wheezing or shortness of breath.   [DISCONTINUED] famotidine (PEPCID) 20 MG tablet Take 1 tablet (20 mg total) by mouth 2 (two) times daily.   [DISCONTINUED] metoprolol succinate (TOPROL-XL) 50 MG 24 hr tablet Take 1 tablet by mouth once daily   [DISCONTINUED] rosuvastatin (CRESTOR) 20 MG tablet TAKE 1 TABLET BY MOUTH ONCE DAILY.  DISCONTINUE ATORVASTATIN   albuterol (VENTOLIN HFA) 108 (90 Base) MCG/ACT inhaler Inhale 2 puffs into  the lungs every 6 (six) hours as needed for wheezing or shortness of breath.   famotidine (PEPCID) 20 MG tablet Take 1 tablet (20 mg total) by mouth 2 (two) times daily.   metoprolol succinate (TOPROL-XL) 50 MG 24 hr tablet Take 1 tablet (50 mg total) by mouth daily. Take with or immediately following a meal.   rosuvastatin (CRESTOR) 20 MG tablet Take 1 tablet (20 mg total) by mouth daily.   [DISCONTINUED] apixaban  (ELIQUIS) 5 MG TABS tablet Take 1 tablet (5 mg total) by mouth 2 (two) times daily.   No facility-administered encounter medications on file as of 07/17/2022.    Surgical History: Past Surgical History:  Procedure Laterality Date   CORONARY/GRAFT ACUTE MI REVASCULARIZATION N/A 12/08/2018   Procedure: Coronary/Graft Acute MI Revascularization;  Surgeon: Yolonda Kida, MD;  Location: [facility];  Service: Cardiovascular;  Laterality: N/A;   INCISION AND DRAINAGE ABSCESS N/A 05/18/2021   Procedure: INCISION AND DRAINAGE SCROTAL ABSCESS, cystoscopy with urethral dilation, and complex catheter placement;  Surgeon: Billey Co, MD;  Location: [facility];  Service: Urology;  Laterality: N/A;   IRRIGATION AND DEBRIDEMENT ABSCESS N/A 05/20/2021   Procedure: IRRIGATION AND DEBRIDEMENT ABSCESS;  Surgeon: Abbie Sons, MD;  Location: [facility];  Service: Urology;  Laterality: N/A;   IRRIGATION AND DEBRIDEMENT ABSCESS N/A 05/21/2021   Procedure: IRRIGATION AND DEBRIDEMENT ABSCESS;  Surgeon: Abbie Sons, MD;  Location: [facility];  Service: Urology;  Laterality: N/A;   KIDNEY STONE SURGERY     left arm surgery  1963   LEFT HEART CATH AND CORONARY ANGIOGRAPHY N/A 12/08/2018   Procedure: LEFT HEART CATH AND CORONARY ANGIOGRAPHY;  Surgeon: Yolonda Kida, MD;  Location: [facility];  Service: Cardiovascular;  Laterality: N/A;   PULMONARY THROMBECTOMY N/A 08/03/2020   Procedure: PULMONARY THROMBECTOMY / THROMBOLYSIS;  Surgeon: Algernon Huxley, MD;  Location: [facility];  Service: Cardiovascular;  Laterality: N/A;   RECTAL EXAM UNDER ANESTHESIA N/A 05/20/2021   Procedure: EXAM UNDER ANESTHESIA-Dressing and Change;  Surgeon: Abbie Sons, MD;  Location: [facility];  Service: Urology;  Laterality: N/A;    Medical History: Past Medical History:  Diagnosis Date   Arthritis    CHF (congestive heart failure) (HCC)    COPD (chronic obstructive pulmonary disease) (HCC)     Diabetes (HCC)    Hyperlipidemia    Hypertension    Kidney stone    Obesity    Tachycardia    TIA (transient ischemic attack)     Family History: Family History  Problem Relation Age of Onset   Alzheimer's disease Mother    CAD Father     Social History   Socioeconomic History   Marital status: Married    Spouse name: [name]    Number of children: 3   Years of education: Not on file   Highest education level: Not on file  Occupational History   Not on file  Tobacco Use   Smoking status: Every Day    Packs/day: 0.50    Types: Cigarettes    Last attempt to quit: 07/13/2020    Years since quitting: 2.1    Passive exposure: Current   Smokeless tobacco: Never  Substance and Sexual Activity   Alcohol use: No   Drug use: No   Sexual activity: Not Currently  Other Topics Concern   Not on file  Social History Narrative   Lives at home with wife , youngest son, grand  daughter and great greatson    Social Determinants of Health   Financial Resource Strain: Not on file  Food Insecurity: No Food Insecurity (06/14/2021)   Hunger Vital Sign    Worried About Running Out of Food in the Last Year: Never true    Ran Out of Food in the Last Year: Never true  Transportation Needs: Unmet Transportation Needs (06/14/2021)   PRAPARE - Hydrologist (Medical): Yes    Lack of Transportation (Non-Medical): Yes  Physical Activity: Unknown (12/08/2018)   Exercise Vital Sign    Days of Exercise per Week: 0 days    Minutes of Exercise per Session: Not on file  Stress: No Stress Concern Present (12/08/2018)   [place]    Feeling of Stress : Only a little  Social Connections: Moderately Integrated (12/08/2018)   Social Connection and Isolation Panel [NHANES]    Frequency of Communication with Friends and Family: More than three times a week    Frequency of Social Gatherings with Friends and Family:  Three times a week    Attends Religious Services: 1 to 4 times per year    Active Member of Clubs or Organizations: No    Attends Archivist Meetings: Never    Marital Status: Married  Human resources officer Violence: Not At Risk (12/08/2018)   Humiliation, Afraid, Rape, and Kick questionnaire    Fear of Current or Ex-Partner: No    Emotionally Abused: No    Physically Abused: No    Sexually Abused: No      Review of Systems  Constitutional:  Positive for fatigue. Negative for activity change, chills and unexpected weight change.  HENT:  Negative for congestion, postnasal drip, rhinorrhea, sinus pressure, sneezing and sore throat.   Respiratory:  Positive for shortness of breath. Negative for cough and chest tightness.        Intermittent shortness of breath and wheezing. Using rescue inhaler much less often   Cardiovascular:  Negative for chest pain and palpitations.       Blood pressure well controlled. Sees cardiology routinely.   Gastrointestinal:  Negative for abdominal pain, constipation, diarrhea, nausea and vomiting.  Endocrine: Negative for cold intolerance, heat intolerance, polydipsia and polyuria.  Genitourinary:  Negative for dysuria, frequency and urgency.  Musculoskeletal:  Positive for arthralgias and gait problem. Negative for back pain, joint swelling and neck pain.       Chronic right hip pain. Uses rolling walker to help with ambulation.  Skin:  Negative for rash.  Allergic/Immunologic: Positive for environmental allergies.  Neurological:  Negative for dizziness, tremors, numbness and headaches.  Hematological:  Negative for adenopathy. Does not bruise/bleed easily.  Psychiatric/Behavioral:  Negative for behavioral problems (Depression), sleep disturbance and suicidal ideas. The patient is not nervous/anxious.     Vital Signs: BP (!) 117/99   Pulse 94   Temp 97.9 F (36.6 C)   Resp 16   Ht _0  (1.854 m)   SpO2 98%   BMI 54.88 kg/m    Physical  Exam Vitals reviewed. Exam conducted with a chaperone present.  Constitutional:      General: He is not in acute distress.    Appearance: Normal appearance. He is obese. He is not ill-appearing.     Comments: Patient is unable to get out of wheelchair or sit on exam table, some parts of exam were not able to be done.   HENT:  Head: Normocephalic and atraumatic.     Right Ear: Tympanic membrane, ear canal and external ear normal.     Left Ear: Tympanic membrane, ear canal and external ear normal.     Nose: Nose normal. No congestion or rhinorrhea.     Mouth/Throat:     Mouth: Mucous membranes are moist.     Pharynx: Oropharynx is clear. No oropharyngeal exudate or posterior oropharyngeal erythema.  Eyes:     Extraocular Movements: Extraocular movements intact.     Pupils: Pupils are equal, round, and reactive to light.  Cardiovascular:     Rate and Rhythm: Normal rate and regular rhythm.     Pulses: Normal pulses.     Heart sounds: Normal heart sounds. No murmur heard. Pulmonary:     Effort: Pulmonary effort is normal. No respiratory distress.     Breath sounds: Normal breath sounds. No wheezing.  Abdominal:     General: Bowel sounds are normal.     Palpations: Abdomen is soft.     Tenderness: There is no abdominal tenderness.  Musculoskeletal:     Right lower leg: Edema present.     Left lower leg: Edema present.  Skin:    General: Skin is warm and dry.     Capillary Refill: Capillary refill takes less than 2 seconds.  Neurological:     Mental Status: He is alert and oriented to person, place, and time.     Gait: Gait abnormal (wheelchair bound).  Psychiatric:        Mood and Affect: Mood normal.        Behavior: Behavior normal.        Assessment/Plan: 1. Encounter for routine adult health examination with abnormal findings Age-appropriate preventive screenings and vaccinations discussed, annual physical exam completed. Routine labs for health maintenance ordered,  see below. PHM updated.  - CBC with Differential/Platelet - Lipid Profile - CMP14+EGFR - TSH + free T4 - Hgb A1C w/o eAG  2. Chronic respiratory failure with hypoxia (HCC) Oxygen DME ordered for portable rechargeable concentrator.  - For home use only DME oxygen  3. Impaired fasting glucose Routine lab ordered - Hgb A1C w/o eAG  4. Atherosclerosis of aorta (HCC) Repeat lipid panel - Lipid Profile  5. Vitamin D deficiency Routine lab ordered - Vitamin D (25 hydroxy)  6. Medication refill - famotidine (PEPCID) 20 MG tablet; Take 1 tablet (20 mg total) by mouth 2 (two) times daily.  Dispense: 180 tablet; Refill: 1 - rosuvastatin (CRESTOR) 20 MG tablet; Take 1 tablet (20 mg total) by mouth daily.  Dispense: 90 tablet; Refill: 3 - metoprolol succinate (TOPROL-XL) 50 MG 24 hr tablet; Take 1 tablet (50 mg total) by mouth daily. Take with or immediately following a meal.  Dispense: 90 tablet; Refill: 3 - albuterol (VENTOLIN HFA) 108 (90 Base) MCG/ACT inhaler; Inhale 2 puffs into the lungs every 6 (six) hours as needed for wheezing or shortness of breath.  Dispense: 18 g; Refill: 6 - lansoprazole (PREVACID) 30 MG capsule; Take 1 capsule (30 mg total) by mouth daily before breakfast.  Dispense: 90 capsule; Refill: 1 - ALPRAZolam (XANAX) 0.25 MG tablet; Take 1 tablet (0.25 mg total) by mouth daily as needed (severe anxiety). May take 1 additional dose after 2 hours if the first dose is not effective.  Dispense: 30 tablet; Refill: 0 - meloxicam (MOBIC) 7.5 MG tablet; Take 1 tablet (7.5 mg total) by mouth daily.  Dispense: 180 tablet; Refill: 3      General  Counseling: Din verbalizes understanding of the findings of todays visit and agrees with plan of treatment. I have discussed any further diagnostic evaluation that may be needed or ordered today. We also reviewed his medications today. he has been encouraged to call the office with any questions or concerns that should arise related to  todays visit.    Orders Placed This Encounter  Procedures   For home use only DME oxygen   CBC with Differential/Platelet   Lipid Profile   CMP14+EGFR   TSH + free T4   Hgb A1C w/o eAG   Vitamin D (25 hydroxy)    Meds ordered this encounter  Medications   famotidine (PEPCID) 20 MG tablet    Sig: Take 1 tablet (20 mg total) by mouth 2 (two) times daily.    Dispense:  180 tablet    Refill:  1   rosuvastatin (CRESTOR) 20 MG tablet    Sig: Take 1 tablet (20 mg total) by mouth daily.    Dispense:  90 tablet    Refill:  3   metoprolol succinate (TOPROL-XL) 50 MG 24 hr tablet    Sig: Take 1 tablet (50 mg total) by mouth daily. Take with or immediately following a meal.    Dispense:  90 tablet    Refill:  3   albuterol (VENTOLIN HFA) 108 (90 Base) MCG/ACT inhaler    Sig: Inhale 2 puffs into the lungs every 6 (six) hours as needed for wheezing or shortness of breath.    Dispense:  18 g    Refill:  6    Can use generic   lansoprazole (PREVACID) 30 MG capsule    Sig: Take 1 capsule (30 mg total) by mouth daily before breakfast.    Dispense:  90 capsule    Refill:  1   ALPRAZolam (XANAX) 0.25 MG tablet    Sig: Take 1 tablet (0.25 mg total) by mouth daily as needed (severe anxiety). May take 1 additional dose after 2 hours if the first dose is not effective.    Dispense:  30 tablet    Refill:  0   meloxicam (MOBIC) 7.5 MG tablet    Sig: Take 1 tablet (7.5 mg total) by mouth daily.    Dispense:  180 tablet    Refill:  3    Return in about 3 months (around 10/17/2022) for F/U, Caydon Feasel PCP.   Total time spent:30 Minutes Time spent includes review of chart, medications, test results, and follow up plan with the patient.   [place] Controlled Substance Database was reviewed by me.  This patient was seen by Jonetta Osgood, FNP-C in collaboration with Dr. Clayborn Bigness as a part of collaborative care agreement.  Tredarius Cobern R. Valetta Fuller, MSN, FNP-C Internal medicine

## 2022-07-17 NOTE — Telephone Encounter (Signed)
Sent order to [facility] for portable oxygen inogen oxygen concentrator 07/17/22 @ 534 pm

## 2022-08-01 DIAGNOSIS — J449 Chronic obstructive pulmonary disease, unspecified: Secondary | ICD-10-CM | POA: Diagnosis not present

## 2022-08-01 IMAGING — US US SCROTUM W/ DOPPLER COMPLETE
1 series · 15 of 25 positions shown · non-contrast
Comparison: None.

CLINICAL DATA: 70-year-old male with testicular swelling for the
past 4 days

EXAM:
SCROTAL ULTRASOUND
DOPPLER ULTRASOUND OF THE TESTICLES
TECHNIQUE: Complete ultrasound examination of the testicles, epididymis, and
other scrotal structures was performed. Color and spectral Doppler
ultrasound were also utilized to evaluate blood flow to the
testicles.

[Series 1: us art/ven flow abd pelv doppl · 15 of 87 slices shown]
[im 1/87]
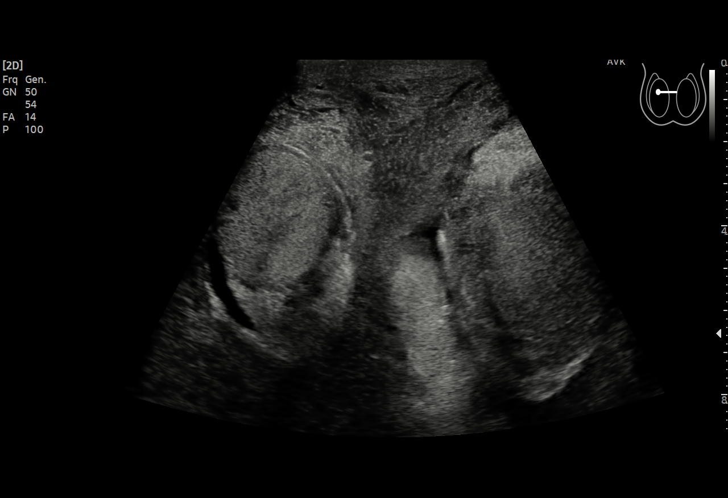
[im 8/87]
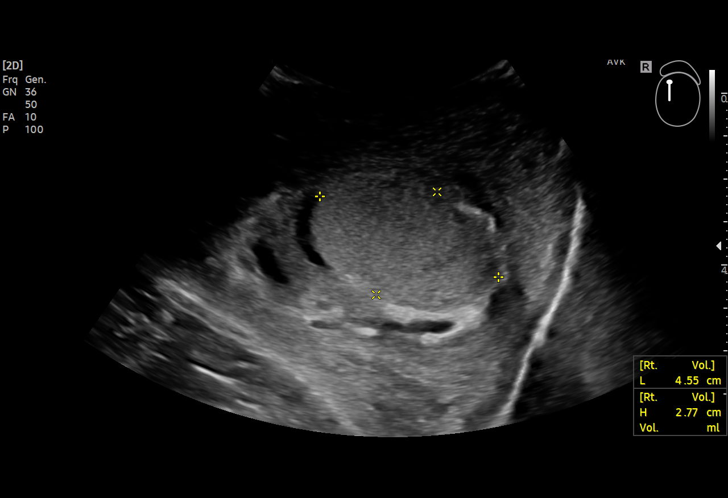
[im 15/87]
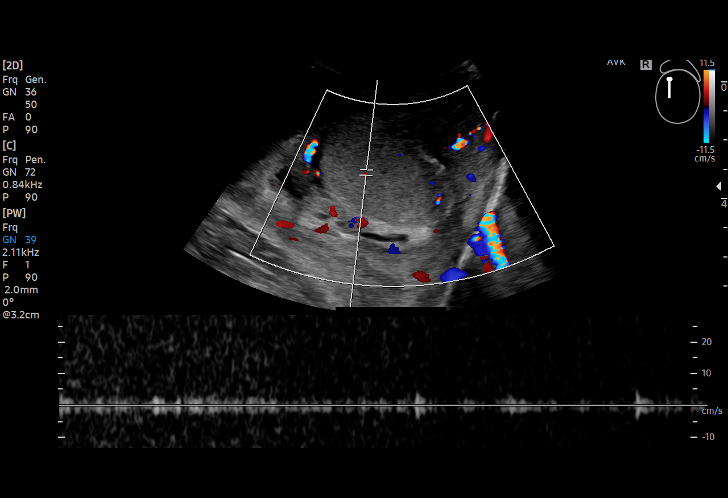
[im 18/87]
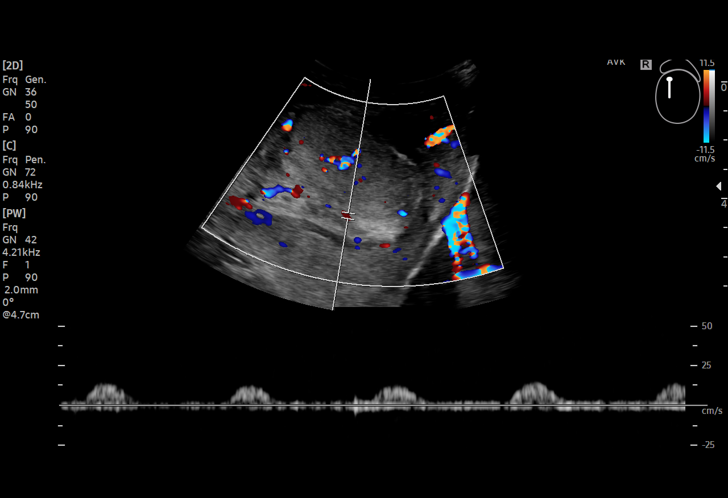
[im 26/87]
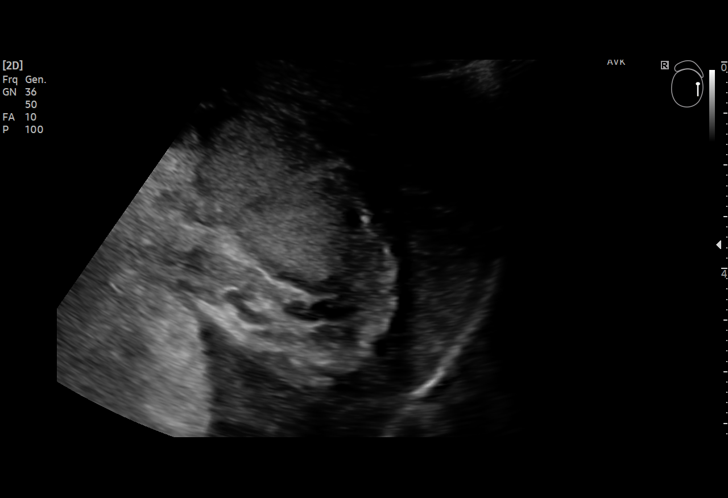
[im 33/87]
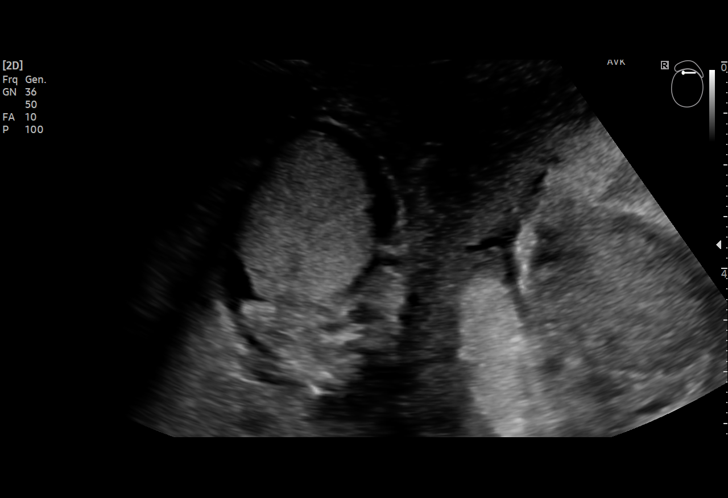
[im 36/87]
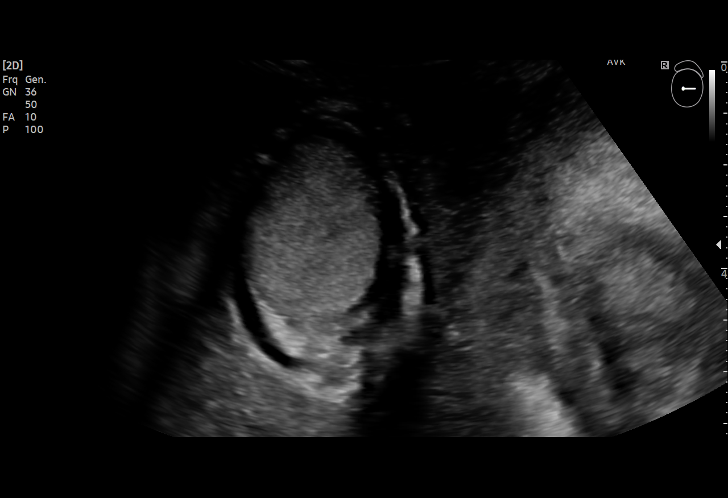
[im 44/87]
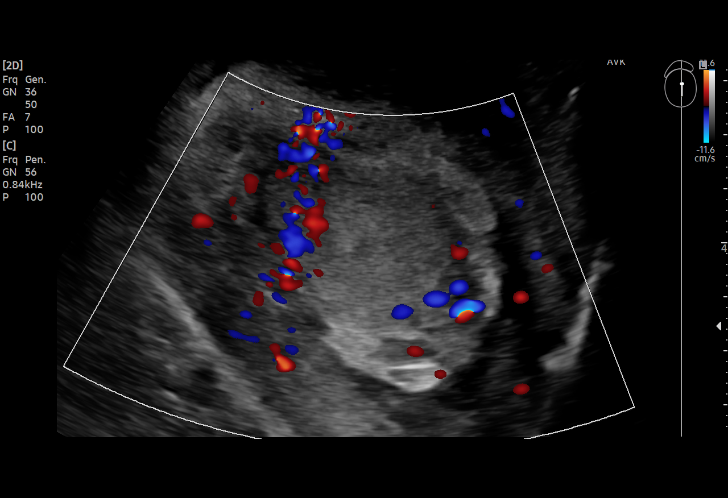
[im 51/87]
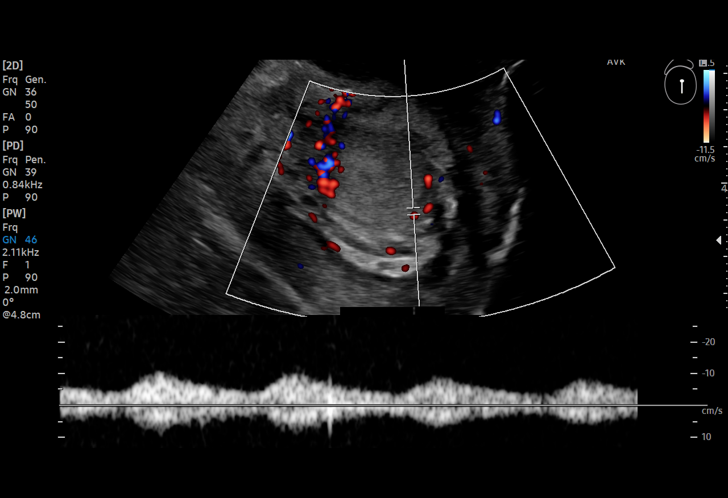
[im 54/87]
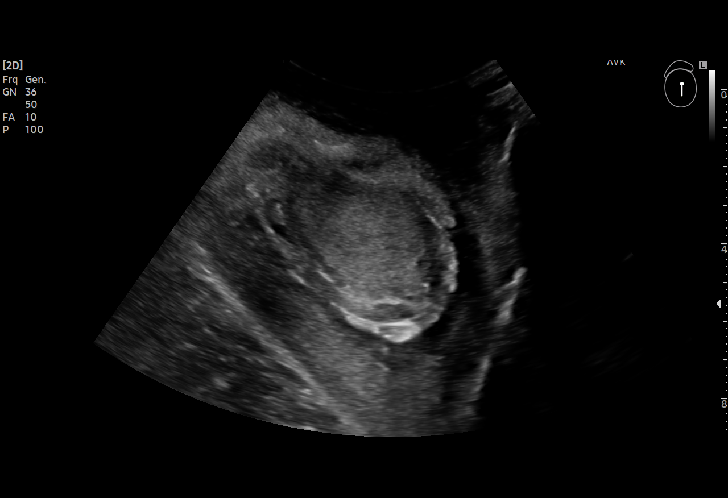
[im 61/87]
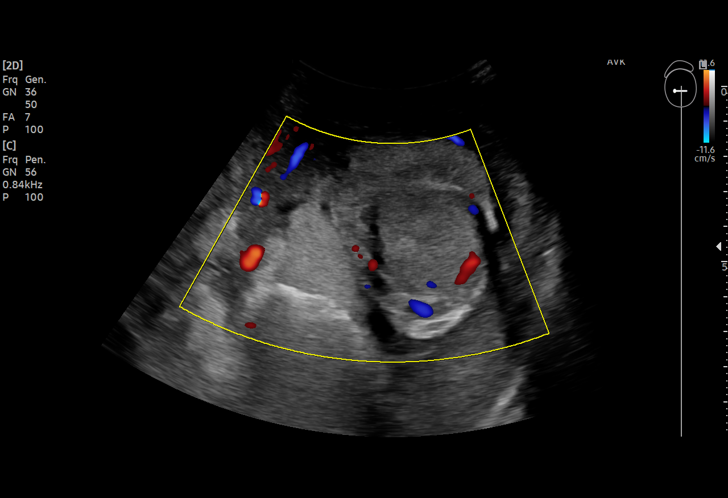
[im 69/87]
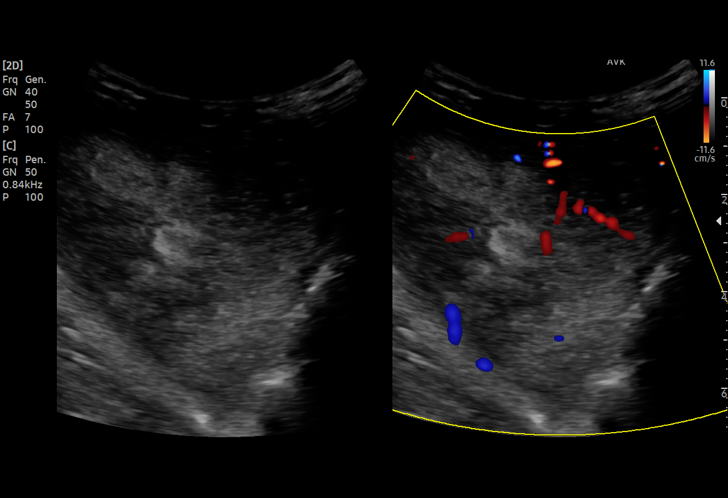
[im 72/87]
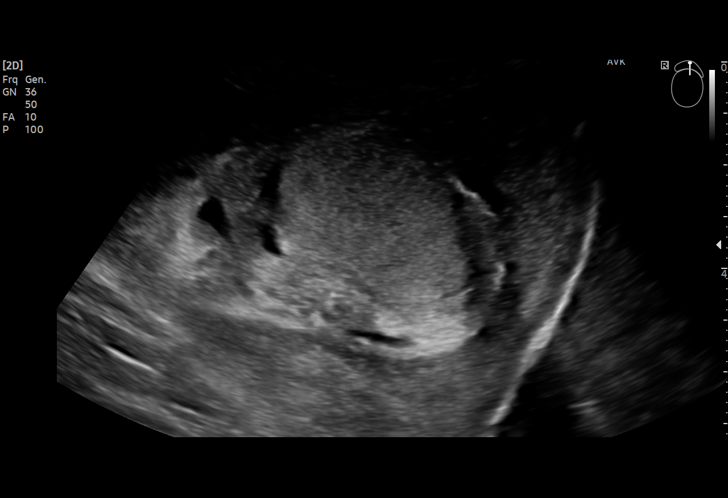
[im 79/87]
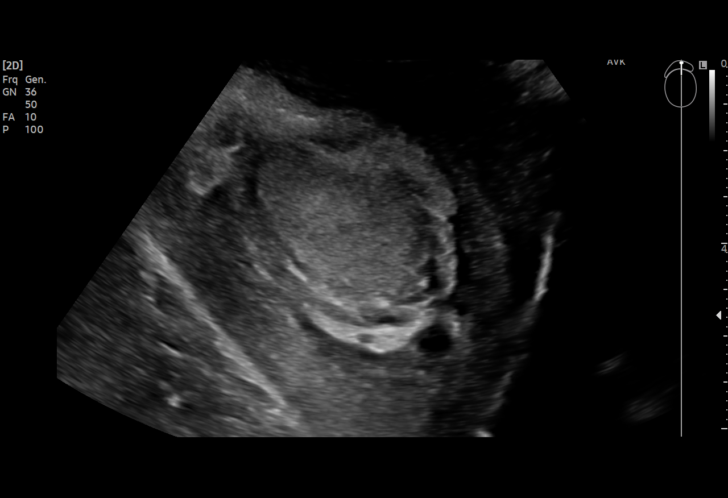
[im 87/87]
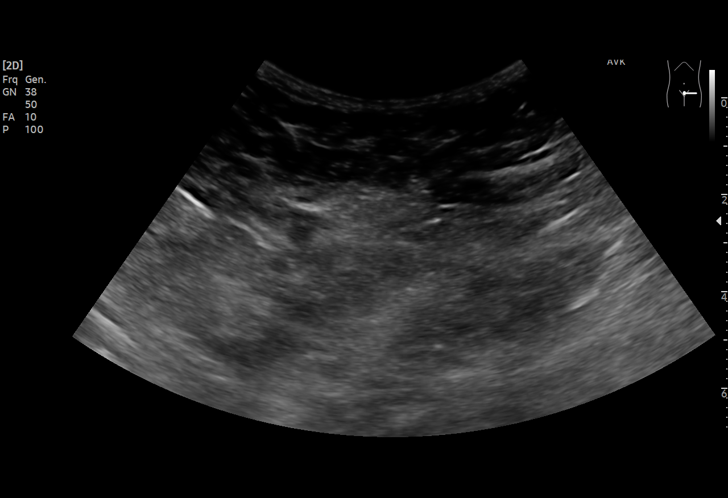

[15 of 25 positions shown; findings below may reference images not displayed]

FINDINGS: Right testicle

Measurements: 4.6 x 2.8 x 2.6 cm. No mass or microlithiasis
visualized.

Left testicle

Measurements: 4.5 x 2.5 x 3.0 cm. No mass or microlithiasis
visualized.

Right epididymis:  Normal in size and appearance.

Left epididymis:  Normal in size and appearance.

Hydrocele:  None visualized.

Varicocele:  None visualized.

Pulsed Doppler interrogation of both testes demonstrates normal low
resistance arterial and venous waveforms bilaterally.
IMPRESSION: 1. Extensive thickening of the scrotum measuring up to 1.8 cm.
Differential considerations include scrotal edema versus cellulitis.
2. Normal sonographic appearance of the testes without evidence of
torsion.

## 2022-08-09 IMAGING — CR DG ABDOMEN 1V
4 series · 4 of 4 positions shown · non-contrast
Comparison: CT pelvis 05/18/2021. Plain films of the abdomen
05/18/2021.

CLINICAL DATA: Constipation abdominal pain.

EXAM:
ABDOMEN - 1 VIEW

[abdomen kub (1 of 4)]
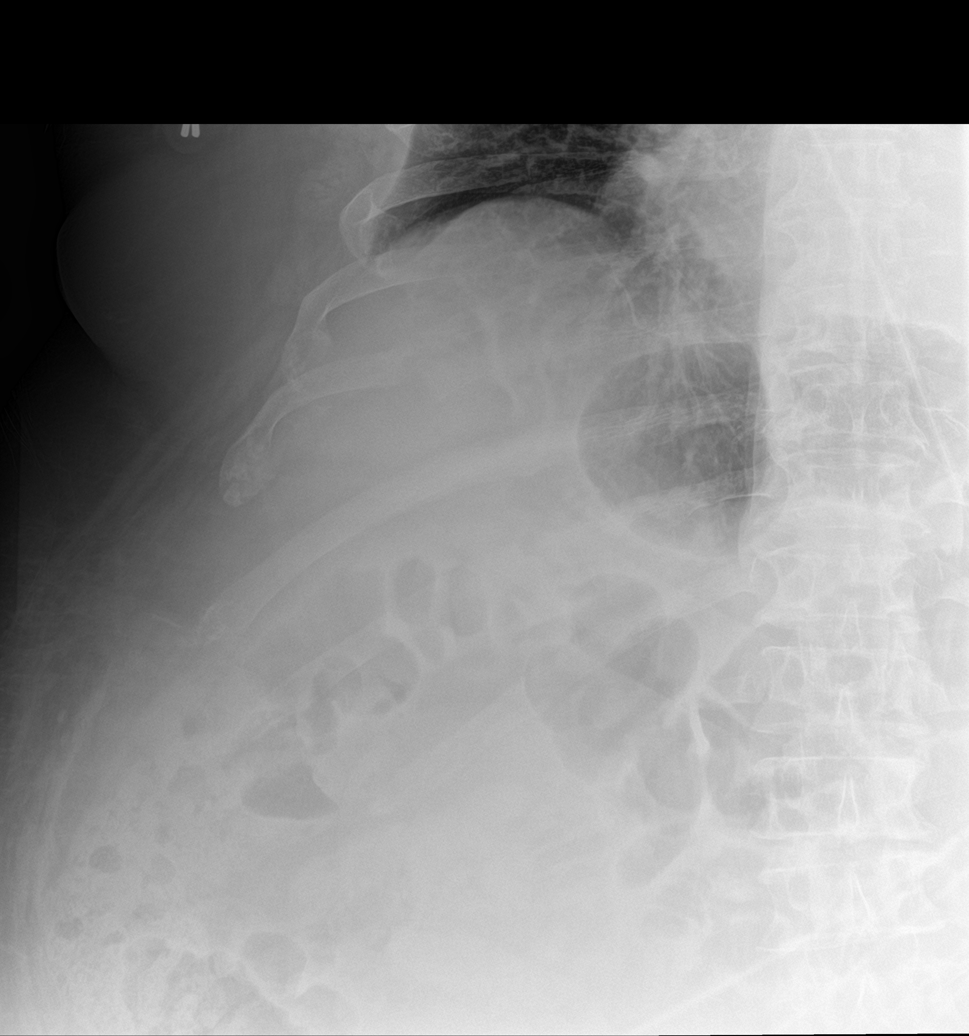

[abdomen kub (2 of 4)]
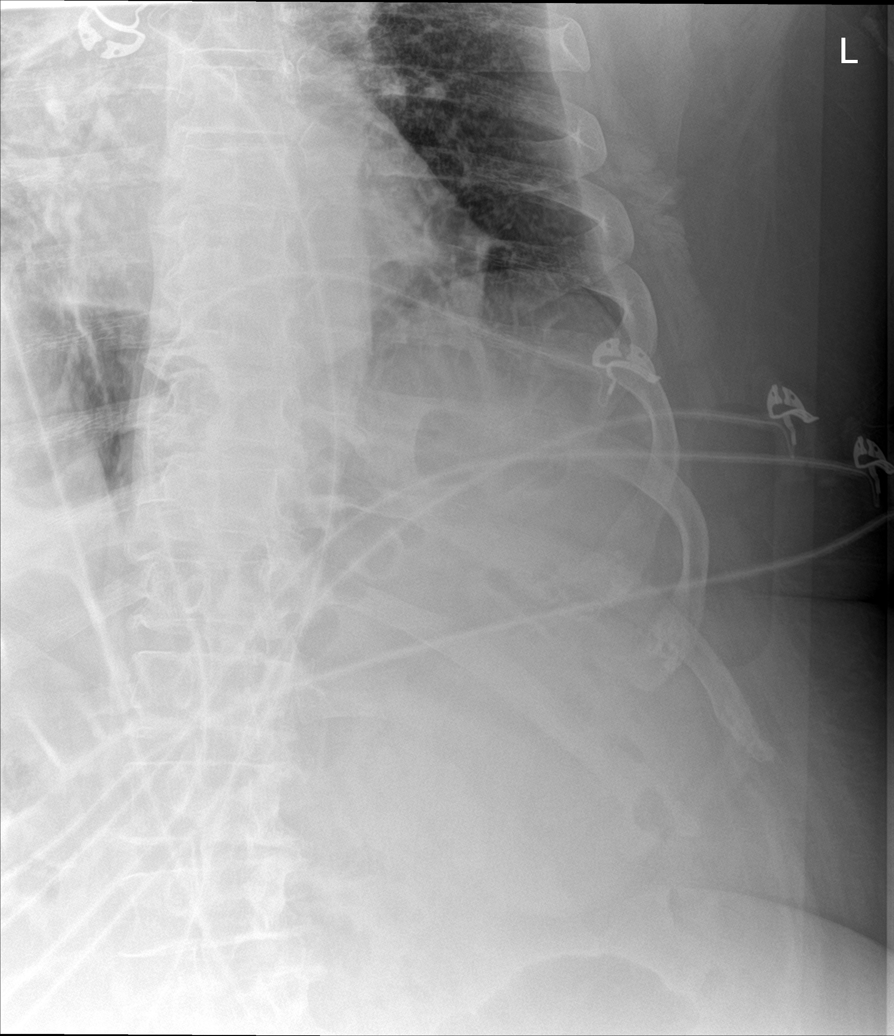

[abdomen kub (3 of 4)]
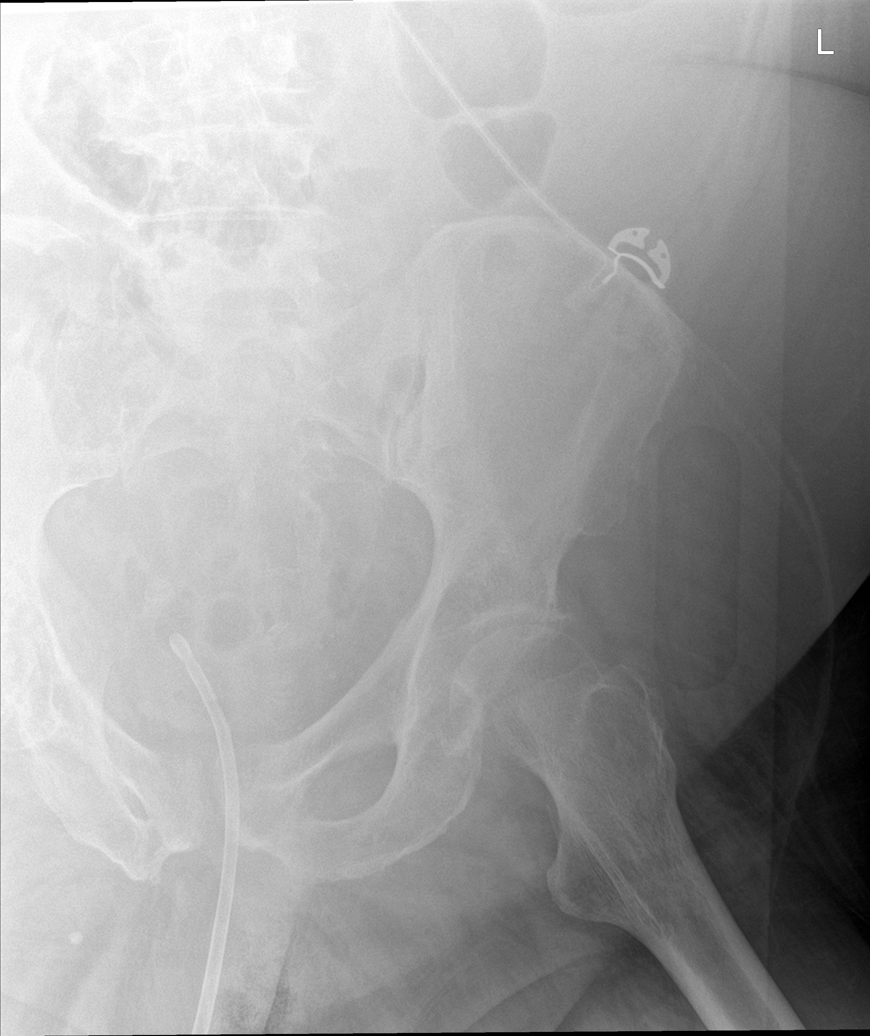

[abdomen kub (4 of 4)]
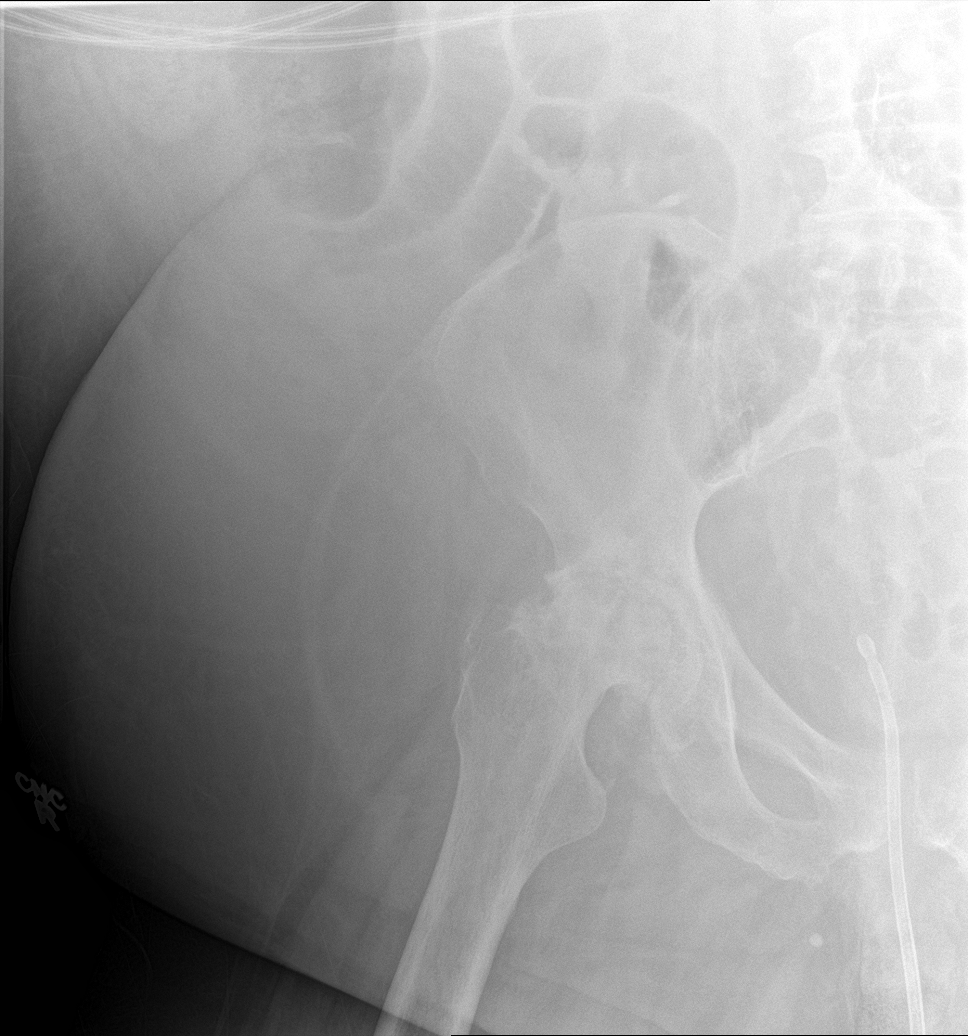

[4 of 4 positions shown; findings below may reference images not displayed]

FINDINGS: The bowel gas pattern is normal. No radio-opaque calculi or other
significant radiographic abnormality are seen. Rectal tube is in
place. Severe right hip osteoarthritis is seen.
IMPRESSION: No acute finding.

## 2022-08-09 IMAGING — DX DG CHEST 1V PORT
1 series · 1 of 1 positions shown · non-contrast
Comparison: 05/05/2021

CLINICAL DATA: Status post right central line placement

EXAM:
PORTABLE CHEST 1 VIEW

[chest ap]
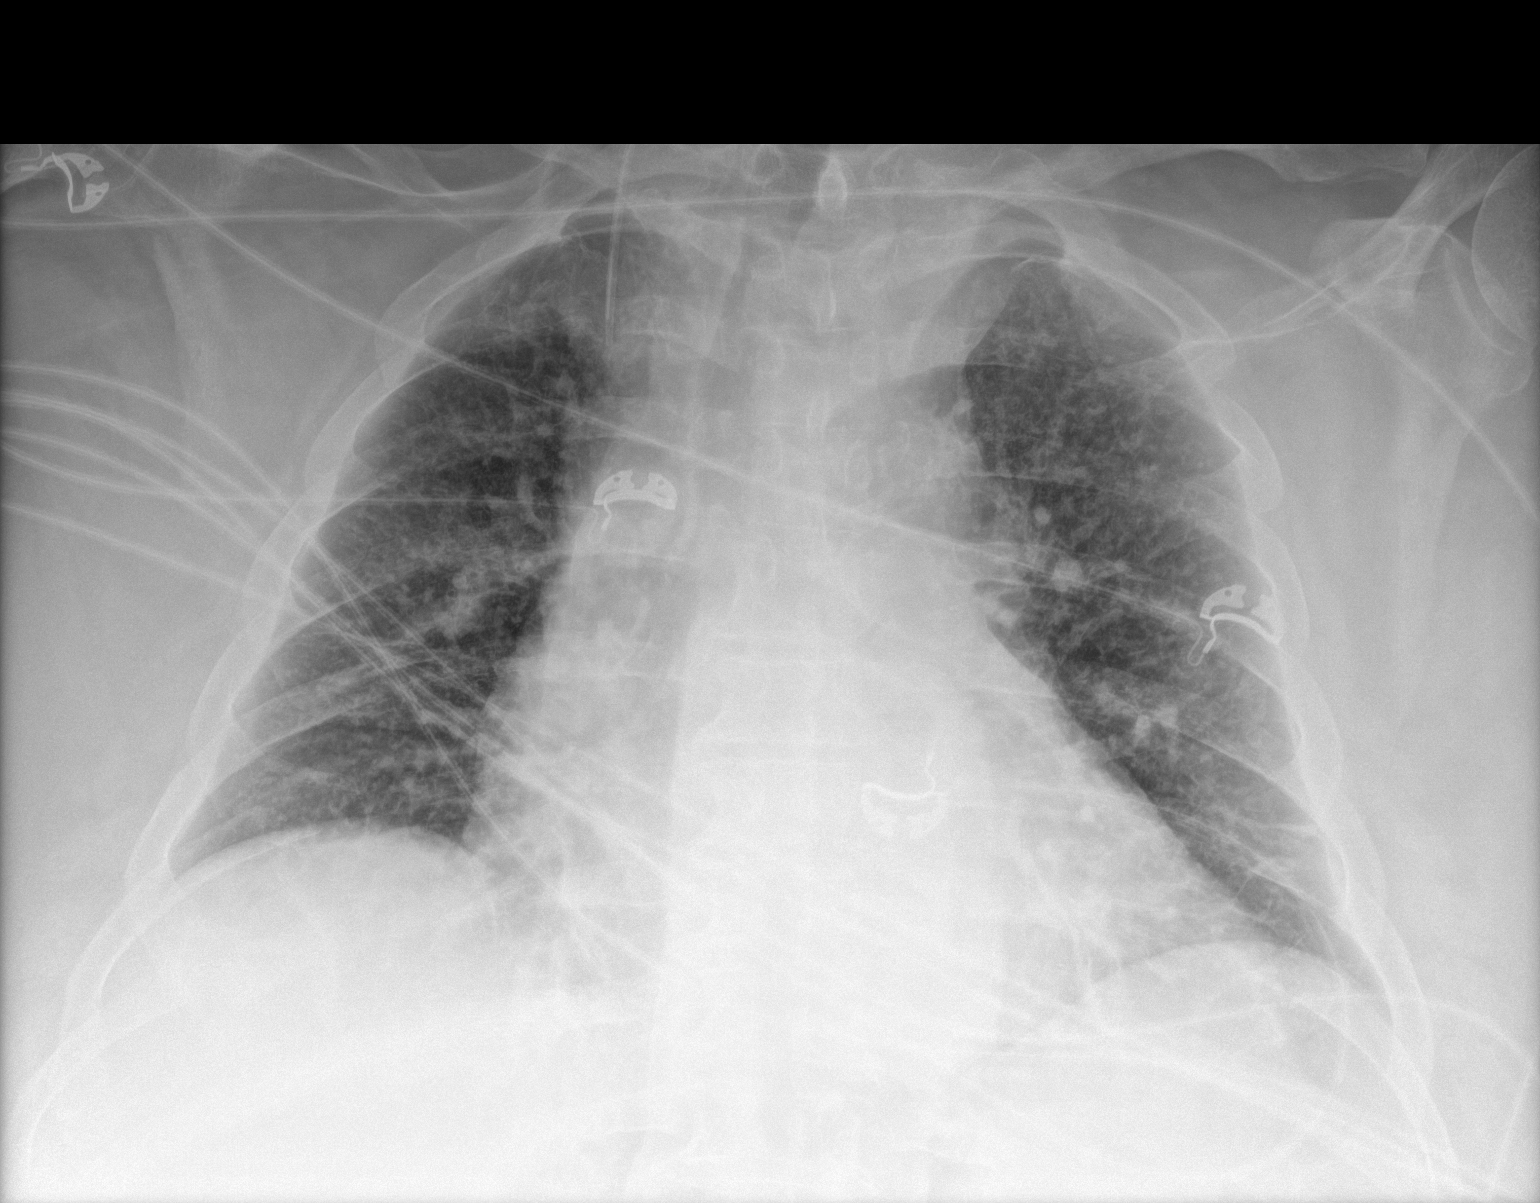

[1 of 1 positions shown; findings below may reference images not displayed]

FINDINGS: Cardiac shadow is enlarged but stable. Right jugular central line is
noted in the midportion of the right innominate vein. No
pneumothorax is seen. No focal infiltrate is noted. No bony
abnormality is seen.
IMPRESSION: No pneumothorax following central line placement as described.

## 2022-09-01 DIAGNOSIS — J449 Chronic obstructive pulmonary disease, unspecified: Secondary | ICD-10-CM | POA: Diagnosis not present

## 2022-09-02 ENCOUNTER — Encounter: Payer: Self-pay | Admitting: Nurse Practitioner

## 2022-09-18 ENCOUNTER — Encounter: Payer: Self-pay | Admitting: Nurse Practitioner

## 2022-09-18 ENCOUNTER — Telehealth (INDEPENDENT_AMBULATORY_CARE_PROVIDER_SITE_OTHER): Payer: Medicare Other | Admitting: Nurse Practitioner

## 2022-09-18 VITALS — Resp 16 | Ht 73.0 in

## 2022-09-18 DIAGNOSIS — I5032 Chronic diastolic (congestive) heart failure: Secondary | ICD-10-CM | POA: Diagnosis not present

## 2022-09-18 DIAGNOSIS — J9611 Chronic respiratory failure with hypoxia: Secondary | ICD-10-CM

## 2022-09-18 DIAGNOSIS — Z760 Encounter for issue of repeat prescription: Secondary | ICD-10-CM

## 2022-09-18 DIAGNOSIS — Z6841 Body Mass Index (BMI) 40.0 and over, adult: Secondary | ICD-10-CM

## 2022-09-18 MED ORDER — MELOXICAM 7.5 MG PO TABS: 7.5000 mg | ORAL_TABLET | Freq: Two times a day (BID) | ORAL | 3 refills | Status: DC

## 2022-09-18 NOTE — Progress Notes (Signed)
[facility] [address] [place], [place] [address]  Internal MEDICINE  Telephone Visit  Patient Name: Thomas Tanner  [id number]  [id number]  Date of Service: 09/18/2022  I connected with the patient at 1220 by telephone and verified the patients identity using two identifiers.   I discussed the limitations, risks, security and privacy concerns of performing an evaluation and management service by telephone and the availability of in person appointments. I also discussed with the patient that there may be a patient responsible charge related to the service.  The patient expressed understanding and agrees to proceed.    Chief Complaint  Patient presents with   Telephone Assessment   Telephone Screen    [phone]   Follow-up    HPI Thomas Tanner presents for a telehealth virtual visit for follow up.  --has not had his labs drawn. Wants to take medication to help with weight loss but needs to get labs drawn first.  --had some constipation and had to strain some while having a BM last week  --a little trouble sleeping but nothing significant  --feels like hip pain is a little worse.  --wants in-person follow up in January --requests to take 7.5 mg meloxicam twice daily.     Current Medication: Outpatient Encounter Medications as of 09/18/2022  Medication Sig   albuterol (VENTOLIN HFA) 108 (90 Base) MCG/ACT inhaler Inhale 2 puffs into the lungs every 6 (six) hours as needed for wheezing or shortness of breath.   ALPRAZolam (XANAX) 0.25 MG tablet Take 1 tablet (0.25 mg total) by mouth daily as needed (severe anxiety). May take 1 additional dose after 2 hours if the first dose is not effective.   famotidine (PEPCID) 20 MG tablet Take 1 tablet (20 mg total) by mouth 2 (two) times daily.   iron polysaccharides (NIFEREX) 150 MG capsule Take 1 capsule (150 mg total) by mouth daily.   lansoprazole (PREVACID) 30 MG capsule Take 1 capsule (30 mg total) by mouth daily  before breakfast.   metoprolol succinate (TOPROL-XL) 50 MG 24 hr tablet Take 1 tablet (50 mg total) by mouth daily. Take with or immediately following a meal.   OXYGEN Inhale 2.5 L into the lungs. 24 HRS CONTINUOUS OXYGEN USES [facility]   phenylephrine-shark liver oil-mineral oil-petrolatum (PREPARATION H) 0.25-14-74.9 % rectal ointment Place 1 application rectally 2 (two) times daily as needed for hemorrhoids.   rosuvastatin (CRESTOR) 20 MG tablet Take 1 tablet (20 mg total) by mouth daily.   senna-docusate (SENOKOT-S) 8.6-50 MG tablet Take 1 tablet by mouth 2 (two) times daily.   vitamin B-12 1000 MCG tablet Take 1 tablet (1,000 mcg total) by mouth daily.   [DISCONTINUED] meloxicam (MOBIC) 7.5 MG tablet Take 1 tablet (7.5 mg total) by mouth daily.   meloxicam (MOBIC) 7.5 MG tablet Take 1 tablet (7.5 mg total) by mouth 2 (two) times daily.   No facility-administered encounter medications on file as of 09/18/2022.    Surgical History: Past Surgical History:  Procedure Laterality Date   CORONARY/GRAFT ACUTE MI REVASCULARIZATION N/A 12/08/2018   Procedure: Coronary/Graft Acute MI Revascularization;  Surgeon: Alwyn Pea, MD;  Location: [facility] INVASIVE CV LAB;  Service: Cardiovascular;  Laterality: N/A;   INCISION AND DRAINAGE ABSCESS N/A 05/18/2021   Procedure: INCISION AND DRAINAGE SCROTAL ABSCESS, cystoscopy with urethral dilation, and complex catheter placement;  Surgeon: Sondra Come, MD;  Location: [facility] ORS;  Service: Urology;  Laterality: N/A;   IRRIGATION AND DEBRIDEMENT ABSCESS N/A 05/20/2021  Procedure: IRRIGATION AND DEBRIDEMENT ABSCESS;  Surgeon: Abbie Sons, MD;  Location: [facility] ORS;  Service: Urology;  Laterality: N/A;   IRRIGATION AND DEBRIDEMENT ABSCESS N/A 05/21/2021   Procedure: IRRIGATION AND DEBRIDEMENT ABSCESS;  Surgeon: Abbie Sons, MD;  Location: [facility] ORS;  Service: Urology;  Laterality: N/A;   KIDNEY STONE SURGERY     left arm surgery  1963    LEFT HEART CATH AND CORONARY ANGIOGRAPHY N/A 12/08/2018   Procedure: LEFT HEART CATH AND CORONARY ANGIOGRAPHY;  Surgeon: Yolonda Kida, MD;  Location: [facility];  Service: Cardiovascular;  Laterality: N/A;   PULMONARY THROMBECTOMY N/A 08/03/2020   Procedure: PULMONARY THROMBECTOMY / THROMBOLYSIS;  Surgeon: Algernon Huxley, MD;  Location: [place] [facility];  Service: Cardiovascular;  Laterality: N/A;   RECTAL EXAM UNDER ANESTHESIA N/A 05/20/2021   Procedure: EXAM UNDER ANESTHESIA-Dressing and Change;  Surgeon: Abbie Sons, MD;  Location: [facility] ORS;  Service: Urology;  Laterality: N/A;    Medical History: Past Medical History:  Diagnosis Date   Arthritis    CHF (congestive heart failure) (HCC)    COPD (chronic obstructive pulmonary disease) (HCC)    Diabetes (HCC)    Hyperlipidemia    Hypertension    Kidney stone    Obesity    Tachycardia    TIA (transient ischemic attack)     Family History: Family History  Problem Relation Age of Onset   Alzheimer's disease Mother    CAD Father     Social History   Socioeconomic History   Marital status: Married    Spouse name: Thomas Tanner    Number of children: 3   Years of education: Not on file   Highest education level: Not on file  Occupational History   Not on file  Tobacco Use   Smoking status: Every Day    Packs/day: 0.50    Types: Cigarettes    Last attempt to quit: 07/13/2020    Years since quitting: 2.1    Passive exposure: Current   Smokeless tobacco: Never  Substance and Sexual Activity   Alcohol use: No   Drug use: No   Sexual activity: Not Currently  Other Topics Concern   Not on file  Social History Narrative   Lives at home with wife , youngest son, grand daughter and great greatson    Social Determinants of Health   Financial Resource Strain: Not on file  Food Insecurity: No Food Insecurity (06/14/2021)   Hunger Vital Sign    Worried About Running Out of Food in the Last Year: Never true    Ran  Out of Food in the Last Year: Never true  Transportation Needs: Unmet Transportation Needs (06/14/2021)   PRAPARE - Transportation    Lack of Transportation (Medical): Yes    Lack of Transportation (Non-Medical): Yes  Physical Activity: Unknown (12/08/2018)   Exercise Vital Sign    Days of Exercise per Week: 0 days    Minutes of Exercise per Session: Not on file  Stress: No Stress Concern Present (12/08/2018)   [employer] [place]    Feeling of Stress : Only a little  Social Connections: Moderately Integrated (12/08/2018)   Social Connection and Isolation Panel [NHANES]    Frequency of Communication with Friends and Family: More than three times a week    Frequency of Social Gatherings with Friends and Family: Three times a week    Attends Religious Services: 1 to 4 times per  year    Active Member of Clubs or Organizations: No    Attends Banker Meetings: Never    Marital Status: Married  Catering manager Violence: Not At Risk (12/08/2018)   Humiliation, Afraid, Rape, and Kick questionnaire    Fear of Current or Ex-Partner: No    Emotionally Abused: No    Physically Abused: No    Sexually Abused: No      Review of Systems  Constitutional:  Negative for chills, fatigue and unexpected weight change.  HENT:  Negative for congestion, postnasal drip, rhinorrhea, sneezing and sore throat.   Eyes:  Negative for redness.  Respiratory:  Positive for shortness of breath (intermittent). Negative for cough, chest tightness and wheezing.   Cardiovascular: Negative.  Negative for chest pain and palpitations.  Gastrointestinal:  Positive for constipation (slight). Negative for abdominal pain, diarrhea, nausea and vomiting.  Genitourinary:  Negative for dysuria and frequency.  Musculoskeletal:  Positive for arthralgias and back pain. Negative for joint swelling and neck pain.  Skin:  Negative for rash.  Neurological:  Negative.  Negative for tremors and numbness.  Hematological:  Negative for adenopathy. Does not bruise/bleed easily.  Psychiatric/Behavioral:  Negative for behavioral problems (Depression), sleep disturbance and suicidal ideas. The patient is not nervous/anxious.     Vital Signs: Resp 16   Ht 6\' 1"  (1.854 m)   BMI 54.88 kg/m    Observation/Objective: He is alert and oriented and engages in conversation appropriately. He does not sound as though he is in any acute distress over telephone call.     Assessment/Plan: 1. Chronic respiratory failure with hypoxia (HCC) On continuous home oxygen, continue supplemental oxygen and prn albuterol inhaler as prescribed.   2. Chronic diastolic CHF (congestive heart failure) (HCC) No changes, continue current medications as prescribed.   3. Medication refill - meloxicam (MOBIC) 7.5 MG tablet; Take 1 tablet (7.5 mg total) by mouth 2 (two) times daily.  Dispense: 180 tablet; Refill: 3  4. Morbid obesity with BMI of 50.0-59.9, adult [facility]) Will discuss further in January after his labs are drawn.    General Counseling: Mak[place] verbalizes understanding of the findings of today's phone visit and agrees with plan of treatment. I have discussed any further diagnostic evaluation that may be needed or ordered today. We also reviewed his medications today. he has been encouraged to call the office with any questions or concerns that should arise related to todays visit.  Return in about 3 months (around 12/19/2022).   No orders of the defined types were placed in this encounter.   Meds ordered this encounter  Medications   meloxicam (MOBIC) 7.5 MG tablet    Sig: Take 1 tablet (7.5 mg total) by mouth 2 (two) times daily.    Dispense:  180 tablet    Refill:  3    Time spent:20 Minutes Time spent with patient included reviewing progress notes, labs, imaging studies, and discussing plan for follow up.  [place] Controlled Substance Database was reviewed  by me for overdose risk score (ORS) if appropriate.  This patient was seen by 02/17/2023, FNP-C in collaboration with Dr. Sallyanne Kuster as a part of collaborative care agreement.  Cyndie Woodbeck R. Beverely Risen, MSN, FNP-C Internal medicine

## 2022-10-01 DIAGNOSIS — J449 Chronic obstructive pulmonary disease, unspecified: Secondary | ICD-10-CM | POA: Diagnosis not present

## 2022-10-19 ENCOUNTER — Ambulatory Visit: Payer: Medicare Other | Admitting: Physician Assistant

## 2022-11-01 DIAGNOSIS — J449 Chronic obstructive pulmonary disease, unspecified: Secondary | ICD-10-CM | POA: Diagnosis not present

## 2022-12-01 DIAGNOSIS — J449 Chronic obstructive pulmonary disease, unspecified: Secondary | ICD-10-CM | POA: Diagnosis not present

## 2022-12-13 ENCOUNTER — Ambulatory Visit: Payer: Medicare Other | Admitting: Physician Assistant

## 2022-12-14 ENCOUNTER — Encounter: Payer: Self-pay | Admitting: Physician Assistant

## 2022-12-15 ENCOUNTER — Inpatient Hospital Stay
Admission: EM | Admit: 2022-12-15 | Discharge: 2022-12-21 | DRG: 871 | Disposition: A | Discharge status: Home Health | Payer: Medicare Other | Attending: Internal Medicine | Admitting: Internal Medicine

## 2022-12-15 ENCOUNTER — Emergency Department: Payer: Medicare Other

## 2022-12-15 DIAGNOSIS — J9622 Acute and chronic respiratory failure with hypercapnia: Secondary | ICD-10-CM | POA: Diagnosis not present

## 2022-12-15 DIAGNOSIS — I5032 Chronic diastolic (congestive) heart failure: Secondary | ICD-10-CM | POA: Diagnosis not present

## 2022-12-15 DIAGNOSIS — I110 Hypertensive heart disease with heart failure: Secondary | ICD-10-CM | POA: Diagnosis present

## 2022-12-15 DIAGNOSIS — J440 Chronic obstructive pulmonary disease with acute lower respiratory infection: Secondary | ICD-10-CM | POA: Diagnosis present

## 2022-12-15 DIAGNOSIS — J9621 Acute and chronic respiratory failure with hypoxia: Secondary | ICD-10-CM | POA: Diagnosis not present

## 2022-12-15 DIAGNOSIS — I4510 Unspecified right bundle-branch block: Secondary | ICD-10-CM | POA: Diagnosis not present

## 2022-12-15 DIAGNOSIS — Y92239 Unspecified place in hospital as the place of occurrence of the external cause: Secondary | ICD-10-CM | POA: Diagnosis not present

## 2022-12-15 DIAGNOSIS — J189 Pneumonia, unspecified organism: Secondary | ICD-10-CM | POA: Diagnosis present

## 2022-12-15 DIAGNOSIS — A419 Sepsis, unspecified organism: Secondary | ICD-10-CM | POA: Diagnosis present

## 2022-12-15 DIAGNOSIS — M549 Dorsalgia, unspecified: Secondary | ICD-10-CM | POA: Diagnosis present

## 2022-12-15 DIAGNOSIS — A4151 Sepsis due to Escherichia coli [E. coli]: Secondary | ICD-10-CM | POA: Diagnosis present

## 2022-12-15 DIAGNOSIS — F17210 Nicotine dependence, cigarettes, uncomplicated: Secondary | ICD-10-CM | POA: Diagnosis not present

## 2022-12-15 DIAGNOSIS — R059 Cough, unspecified: Secondary | ICD-10-CM | POA: Diagnosis not present

## 2022-12-15 DIAGNOSIS — J441 Chronic obstructive pulmonary disease with (acute) exacerbation: Secondary | ICD-10-CM | POA: Diagnosis not present

## 2022-12-15 DIAGNOSIS — Z87438 Personal history of other diseases of male genital organs: Secondary | ICD-10-CM | POA: Diagnosis present

## 2022-12-15 DIAGNOSIS — I5033 Acute on chronic diastolic (congestive) heart failure: Secondary | ICD-10-CM | POA: Diagnosis not present

## 2022-12-15 DIAGNOSIS — J9811 Atelectasis: Secondary | ICD-10-CM | POA: Diagnosis present

## 2022-12-15 DIAGNOSIS — E872 Acidosis, unspecified: Secondary | ICD-10-CM | POA: Diagnosis not present

## 2022-12-15 DIAGNOSIS — Z885 Allergy status to narcotic agent status: Secondary | ICD-10-CM

## 2022-12-15 DIAGNOSIS — Z87442 Personal history of urinary calculi: Secondary | ICD-10-CM

## 2022-12-15 DIAGNOSIS — J449 Chronic obstructive pulmonary disease, unspecified: Secondary | ICD-10-CM | POA: Diagnosis present

## 2022-12-15 DIAGNOSIS — Z9981 Dependence on supplemental oxygen: Secondary | ICD-10-CM | POA: Diagnosis not present

## 2022-12-15 DIAGNOSIS — I10 Essential (primary) hypertension: Secondary | ICD-10-CM | POA: Diagnosis present

## 2022-12-15 DIAGNOSIS — T363X5A Adverse effect of macrolides, initial encounter: Secondary | ICD-10-CM | POA: Diagnosis not present

## 2022-12-15 DIAGNOSIS — R0600 Dyspnea, unspecified: Secondary | ICD-10-CM | POA: Diagnosis not present

## 2022-12-15 DIAGNOSIS — E785 Hyperlipidemia, unspecified: Secondary | ICD-10-CM | POA: Diagnosis not present

## 2022-12-15 DIAGNOSIS — Z8249 Family history of ischemic heart disease and other diseases of the circulatory system: Secondary | ICD-10-CM

## 2022-12-15 DIAGNOSIS — Z8673 Personal history of transient ischemic attack (TIA), and cerebral infarction without residual deficits: Secondary | ICD-10-CM

## 2022-12-15 DIAGNOSIS — L298 Other pruritus: Secondary | ICD-10-CM | POA: Diagnosis not present

## 2022-12-15 DIAGNOSIS — G8929 Other chronic pain: Secondary | ICD-10-CM | POA: Diagnosis present

## 2022-12-15 DIAGNOSIS — E119 Type 2 diabetes mellitus without complications: Secondary | ICD-10-CM | POA: Diagnosis not present

## 2022-12-15 DIAGNOSIS — Z6841 Body Mass Index (BMI) 40.0 and over, adult: Secondary | ICD-10-CM | POA: Diagnosis not present

## 2022-12-15 DIAGNOSIS — E875 Hyperkalemia: Secondary | ICD-10-CM | POA: Diagnosis present

## 2022-12-15 DIAGNOSIS — Z820 Family history of epilepsy and other diseases of the nervous system: Secondary | ICD-10-CM

## 2022-12-15 DIAGNOSIS — J439 Emphysema, unspecified: Secondary | ICD-10-CM | POA: Diagnosis not present

## 2022-12-15 DIAGNOSIS — R0602 Shortness of breath: Secondary | ICD-10-CM | POA: Diagnosis not present

## 2022-12-15 DIAGNOSIS — R6520 Severe sepsis without septic shock: Secondary | ICD-10-CM | POA: Diagnosis present

## 2022-12-15 DIAGNOSIS — R6889 Other general symptoms and signs: Secondary | ICD-10-CM | POA: Diagnosis not present

## 2022-12-15 DIAGNOSIS — Z1152 Encounter for screening for COVID-19: Secondary | ICD-10-CM

## 2022-12-15 DIAGNOSIS — Z955 Presence of coronary angioplasty implant and graft: Secondary | ICD-10-CM

## 2022-12-15 DIAGNOSIS — Z743 Need for continuous supervision: Secondary | ICD-10-CM | POA: Diagnosis not present

## 2022-12-15 DIAGNOSIS — I499 Cardiac arrhythmia, unspecified: Secondary | ICD-10-CM | POA: Diagnosis not present

## 2022-12-15 DIAGNOSIS — Z7901 Long term (current) use of anticoagulants: Secondary | ICD-10-CM

## 2022-12-15 DIAGNOSIS — Z872 Personal history of diseases of the skin and subcutaneous tissue: Secondary | ICD-10-CM

## 2022-12-15 DIAGNOSIS — Z86711 Personal history of pulmonary embolism: Secondary | ICD-10-CM

## 2022-12-15 DIAGNOSIS — Z79899 Other long term (current) drug therapy: Secondary | ICD-10-CM

## 2022-12-15 DIAGNOSIS — R509 Fever, unspecified: Secondary | ICD-10-CM | POA: Diagnosis not present

## 2022-12-15 LAB — BLOOD GAS, VENOUS
Acid-Base Excess: 9.7 mmol/L — ABNORMAL HIGH (ref 0.0–2.0)
Bicarbonate: 37.9 mmol/L — ABNORMAL HIGH (ref 20.0–28.0)
O2 Saturation: 69.6 %
Patient temperature: 37
pCO2, Ven: 67 mmHg — ABNORMAL HIGH (ref 44–60)
pH, Ven: 7.36 (ref 7.25–7.43)
pO2, Ven: 48 mmHg — ABNORMAL HIGH (ref 32–45)

## 2022-12-15 LAB — CBC WITH DIFFERENTIAL/PLATELET
Abs Immature Granulocytes: 0.05 10*3/uL (ref 0.00–0.07)
Basophils Absolute: 0 10*3/uL (ref 0.0–0.1)
Basophils Relative: 0 %
Eosinophils Absolute: 0 10*3/uL (ref 0.0–0.5)
Eosinophils Relative: 0 %
HCT: 30.3 % — ABNORMAL LOW (ref 39.0–52.0)
Hemoglobin: 10.7 g/dL — ABNORMAL LOW (ref 13.0–17.0)
Immature Granulocytes: 0 %
Lymphocytes Relative: 2 %
Lymphs Abs: 0.3 10*3/uL — ABNORMAL LOW (ref 0.7–4.0)
MCH: 31.8 pg (ref 26.0–34.0)
MCHC: 35.3 g/dL (ref 30.0–36.0)
MCV: 89.9 fL (ref 80.0–100.0)
Monocytes Absolute: 1.2 10*3/uL — ABNORMAL HIGH (ref 0.1–1.0)
Monocytes Relative: 10 %
Neutro Abs: 10.1 10*3/uL — ABNORMAL HIGH (ref 1.7–7.7)
Neutrophils Relative %: 88 %
Platelets: 205 10*3/uL (ref 150–400)
RBC: 3.37 MIL/uL — ABNORMAL LOW (ref 4.22–5.81)
RDW: 21.8 % — ABNORMAL HIGH (ref 11.5–15.5)
WBC: 11.7 10*3/uL — ABNORMAL HIGH (ref 4.0–10.5)
nRBC: 0 % (ref 0.0–0.2)

## 2022-12-15 LAB — COMPREHENSIVE METABOLIC PANEL
ALT: 10 U/L (ref 0–44)
AST: 20 U/L (ref 15–41)
Albumin: 2.9 g/dL — ABNORMAL LOW (ref 3.5–5.0)
Alkaline Phosphatase: 103 U/L (ref 38–126)
Anion gap: 9 (ref 5–15)
BUN: 18 mg/dL (ref 8–23)
CO2: 29 mmol/L (ref 22–32)
Calcium: 7.9 mg/dL — ABNORMAL LOW (ref 8.9–10.3)
Chloride: 96 mmol/L — ABNORMAL LOW (ref 98–111)
Creatinine, Ser: 1.14 mg/dL (ref 0.61–1.24)
GFR, Estimated: >60 mL/min (ref >60)
Glucose, Bld: 158 mg/dL — ABNORMAL HIGH (ref 70–99)
Potassium: 5.2 mmol/L — ABNORMAL HIGH (ref 3.5–5.1)
Sodium: 134 mmol/L — ABNORMAL LOW (ref 135–145)
Total Bilirubin: 1.2 mg/dL (ref 0.3–1.2)
Total Protein: 6.2 g/dL — ABNORMAL LOW (ref 6.5–8.1)

## 2022-12-15 LAB — APTT: aPTT: 32 seconds (ref 24–36)

## 2022-12-15 LAB — RESP PANEL BY RT-PCR (RSV, FLU A&B, COVID)  RVPGX2
Influenza A by PCR: NEGATIVE
Influenza B by PCR: NEGATIVE
Resp Syncytial Virus by PCR: NEGATIVE
SARS Coronavirus 2 by RT PCR: NEGATIVE

## 2022-12-15 LAB — PROTIME-INR
INR: 1.4 — ABNORMAL HIGH (ref 0.8–1.2)
Prothrombin Time: 17.1 seconds — ABNORMAL HIGH (ref 11.4–15.2)

## 2022-12-15 LAB — BRAIN NATRIURETIC PEPTIDE: B Natriuretic Peptide: 207.9 pg/mL — ABNORMAL HIGH (ref 0.0–100.0)

## 2022-12-15 LAB — LACTIC ACID, PLASMA: Lactic Acid, Venous: 2.3 mmol/L (ref 0.5–1.9)

## 2022-12-15 MED ORDER — LACTATED RINGERS IV SOLN: INTRAVENOUS | Status: DC

## 2022-12-15 MED ORDER — ALBUTEROL SULFATE (2.5 MG/3ML) 0.083% IN NEBU
5.0000 mg | INHALATION_SOLUTION | Freq: Once | RESPIRATORY_TRACT | Status: AC
  Administered 2022-12-15: 5 mg via RESPIRATORY_TRACT
  Filled 2022-12-15: qty 6

## 2022-12-15 MED ORDER — IPRATROPIUM-ALBUTEROL 0.5-2.5 (3) MG/3ML IN SOLN
3.0000 mL | Freq: Once | RESPIRATORY_TRACT | Status: AC
  Administered 2022-12-15: 3 mL via RESPIRATORY_TRACT
  Filled 2022-12-15: qty 3

## 2022-12-15 MED ORDER — SODIUM CHLORIDE 0.9 % IV BOLUS (SEPSIS)
1000.0000 mL | Freq: Once | INTRAVENOUS | Status: AC
  Administered 2022-12-15: 1000 mL via INTRAVENOUS

## 2022-12-15 MED ORDER — METHYLPREDNISOLONE SODIUM SUCC 125 MG IJ SOLR
125.0000 mg | INTRAMUSCULAR | Status: AC
  Administered 2022-12-15: 125 mg via INTRAVENOUS
  Filled 2022-12-15: qty 2

## 2022-12-15 MED ORDER — SODIUM CHLORIDE 0.9 % IV SOLN
500.0000 mg | INTRAVENOUS | Status: DC
  Administered 2022-12-15: 500 mg via INTRAVENOUS
  Filled 2022-12-15: qty 5

## 2022-12-15 MED ORDER — SODIUM CHLORIDE 0.9 % IV SOLN
2.0000 g | INTRAVENOUS | Status: DC
  Administered 2022-12-15: 2 g via INTRAVENOUS
  Filled 2022-12-15: qty 20

## 2022-12-15 NOTE — Progress Notes (Signed)
CODE SEPSIS - PHARMACY COMMUNICATION  **Broad Spectrum Antibiotics should be administered within 1 hour of Sepsis diagnosis**  Time Code Sepsis Called/Page Received: 2156  Antibiotics Ordered: ceftriaxone and azithromycin  Time of 1st antibiotic administration: 2211  Additional action taken by pharmacy: N/A   Lorin Picket ,PharmD Clinical Pharmacist  12/15/2022  10:09 PM

## 2022-12-15 NOTE — ED Triage Notes (Signed)
Pt arrived via EMS from [employer] Per pt he went to a urgent care and was tested for COVID and the test came back negative. Pt is on home o2 at 3L via [place]. Today, Ems placed pt on 10l/min via [facility] due to o2 sat being 81% on EMS arrival to pt home.

## 2022-12-15 NOTE — ED Provider Notes (Signed)
[facility] Provider Note    Event Date/Time   First MD Initiated Contact with Patient 12/15/22 2150     (approximate)   History   Chief Complaint: Shortness of Breath   HPI  Thomas Tanner is a 72 y.o. male with a past history of hypertension, CHF, diabetes, COPD on 3 L nasal cannula at home who comes the ED complaining of worsening shortness of breath for the past 3 days.  Went to urgent care yesterday, was tested for COVID which was negative.  He has noticed worsening symptoms, EMS noted his oxygenation to be 81% on his usual 3 L nasal cannula and placed him on nonrebreather.  Patient also reports that he has had decreased oral intake due to malaise and loss of appetite.  Denies chest pain vomiting.     Physical Exam   Triage Vital Signs: ED Triage Vitals  Enc Vitals Group     BP 12/15/22 2150 (!) 109/58     Pulse Rate 12/15/22 2145 98     Resp 12/15/22 2145 (!) 21     Temp 12/15/22 2145 100.1 F (37.8 C)     Temp Source 12/15/22 2145 Oral     SpO2 12/15/22 2144 96 %     Weight 12/15/22 2146 (!) 392 lb 6.7 oz (178 kg)     Height --      Head Circumference --      Peak Flow --      Pain Score 12/15/22 2146 0     Pain Loc --      Pain Edu? --      Excl. in [place]? --     Most recent vital signs: Vitals:   12/15/22 2149 12/15/22 2150  BP:  (!) 109/58  Pulse:    Resp:    Temp:    SpO2: 95%     General: Awake, moderate respiratory distress CV:  Good peripheral perfusion.  Regular rate and rhythm Resp:  Normal effort.  Diminished breath sounds diffusely.  Expiratory wheezing.  Bilateral basilar crackles. Abd:  No distention.  Soft nontender Other:  No lower extremity edema.  Chronic lymphedema.  Morbidly obese.  Dry mucous membranes.   ED Results / Procedures / Treatments   Labs (all labs ordered are listed, but only abnormal results are displayed) Labs Reviewed  CBC WITH DIFFERENTIAL/PLATELET - Abnormal; Notable for the  following components:      Result Value   WBC 11.7 (*)    RBC 3.37 (*)    Hemoglobin 10.7 (*)    HCT 30.3 (*)    RDW 21.8 (*)    All other components within normal limits  PROTIME-INR - Abnormal; Notable for the following components:   Prothrombin Time 17.1 (*)    INR 1.4 (*)    All other components within normal limits  BLOOD GAS, VENOUS - Abnormal; Notable for the following components:   pCO2, Ven 67 (*)    pO2, Ven 48 (*)    Bicarbonate 37.9 (*)    Acid-Base Excess 9.7 (*)    All other components within normal limits  RESP PANEL BY RT-PCR (RSV, FLU A&B, COVID)  RVPGX2  CULTURE, BLOOD (ROUTINE X 2)  CULTURE, BLOOD (ROUTINE X 2)  URINE CULTURE  APTT  LACTIC ACID, PLASMA  LACTIC ACID, PLASMA  COMPREHENSIVE METABOLIC PANEL  URINALYSIS, COMPLETE (UACMP) WITH MICROSCOPIC  BRAIN NATRIURETIC PEPTIDE  PROCALCITONIN     EKG Interpreted by me Sinus rhythm, rate of 93.  Normal axis.  First-degree AV block.  Right bundle branch block.  No acute ischemic changes.   RADIOLOGY Chest x-ray interpreted by me, shows bilateral basilar infiltrates.  Radiology report reviewed   PROCEDURES:  .Critical Care  Performed by: Sharman Cheek, MD Authorized by: Sharman Cheek, MD   Critical care provider statement:    Critical care time (minutes):  35   Critical care time was exclusive of:  Separately billable procedures and treating other patients   Critical care was necessary to treat or prevent imminent or life-threatening deterioration of the following conditions:  Sepsis and respiratory failure   Critical care was time spent personally by me on the following activities:  Development of treatment plan with patient or surrogate, discussions with consultants, evaluation of patient's response to treatment, examination of patient, obtaining history from patient or surrogate, ordering and performing treatments and interventions, ordering and review of laboratory studies, ordering and  review of radiographic studies, pulse oximetry, re-evaluation of patient's condition and review of old charts   Care discussed with: admitting provider   Comments:        .1-3 Lead EKG Interpretation  Performed by: Sharman Cheek, MD Authorized by: Sharman Cheek, MD     Interpretation: normal     ECG rate:  90   ECG rate assessment: normal     Rhythm: sinus rhythm     Ectopy: none      MEDICATIONS ORDERED IN ED: Medications  lactated ringers infusion (has no administration in time range)  sodium chloride 0.9 % bolus 1,000 mL (1,000 mLs Intravenous New Bag/Given 12/15/22 2200)  cefTRIAXone (ROCEPHIN) 2 g in sodium chloride 0.9 % 100 mL IVPB (2 g Intravenous New Bag/Given 12/15/22 2211)  azithromycin (ZITHROMAX) 500 mg in sodium chloride 0.9 % 250 mL IVPB (has no administration in time range)  methylPREDNISolone sodium succinate (SOLU-MEDROL) 125 mg/2 mL injection 125 mg (has no administration in time range)  ipratropium-albuterol (DUONEB) 0.5-2.5 (3) MG/3ML nebulizer solution 3 mL (has no administration in time range)  albuterol (PROVENTIL) (2.5 MG/3ML) 0.083% nebulizer solution 5 mg (has no administration in time range)     IMPRESSION / MDM / ASSESSMENT AND PLAN / ED COURSE  I reviewed the triage vital signs and the nursing notes.                              Differential diagnosis includes, but is not limited to, pneumonia, pleural effusion, pulmonary edema, viral illness, sepsis, COPD exacerbation.  Doubt NSTEMI, acute heart failure, or PE.  Doubt pericardial effusion.  Patient's presentation is most consistent with acute presentation with potential threat to life or bodily function.  Patient presents with acute on chronic hypoxic respiratory failure, tachypnea, low-grade fever.  Initial presentation with exam and chest x-ray concerning for pneumonia.  Sepsis protocol initiated.  IV antibiotics with Rocephin and azithromycin ordered.  Will also give Solu-Medrol and  bronchodilators for COPD exacerbation.  Will need to admit for further management.  No signs of shock presently.      FINAL CLINICAL IMPRESSION(S) / ED DIAGNOSES   Final diagnoses:  Acute on chronic respiratory failure with hypoxia (HCC)  Morbid obesity (HCC)     Rx / DC Orders   ED Discharge Orders     None        Note:  This document was prepared using Dragon voice recognition software and may include unintentional dictation errors.   Sharman Cheek, MD 12/15/22  2243  

## 2022-12-15 NOTE — Sepsis Progress Note (Signed)
Elink following code sepsis °

## 2022-12-16 DIAGNOSIS — J9621 Acute and chronic respiratory failure with hypoxia: Secondary | ICD-10-CM | POA: Diagnosis not present

## 2022-12-16 DIAGNOSIS — Z7901 Long term (current) use of anticoagulants: Secondary | ICD-10-CM

## 2022-12-16 DIAGNOSIS — J9622 Acute and chronic respiratory failure with hypercapnia: Secondary | ICD-10-CM

## 2022-12-16 DIAGNOSIS — E119 Type 2 diabetes mellitus without complications: Secondary | ICD-10-CM

## 2022-12-16 DIAGNOSIS — Z86711 Personal history of pulmonary embolism: Secondary | ICD-10-CM

## 2022-12-16 DIAGNOSIS — J189 Pneumonia, unspecified organism: Secondary | ICD-10-CM

## 2022-12-16 LAB — URINALYSIS, COMPLETE (UACMP) WITH MICROSCOPIC
Bilirubin Urine: NEGATIVE
Glucose, UA: NEGATIVE mg/dL
Hgb urine dipstick: NEGATIVE
Ketones, ur: NEGATIVE mg/dL
Nitrite: POSITIVE — AB
Protein, ur: NEGATIVE mg/dL
Specific Gravity, Urine: 1.009 (ref 1.005–1.030)
pH: 6 (ref 5.0–8.0)

## 2022-12-16 LAB — CBG MONITORING, ED
Glucose-Capillary: 160 mg/dL — ABNORMAL HIGH (ref 70–99)
Glucose-Capillary: 152 mg/dL — ABNORMAL HIGH (ref 70–99)
Glucose-Capillary: 149 mg/dL — ABNORMAL HIGH (ref 70–99)
Glucose-Capillary: 171 mg/dL — ABNORMAL HIGH (ref 70–99)
Glucose-Capillary: 147 mg/dL — ABNORMAL HIGH (ref 70–99)

## 2022-12-16 LAB — BLOOD GAS, ARTERIAL
Acid-Base Excess: 6.8 mmol/L — ABNORMAL HIGH (ref 0.0–2.0)
Bicarbonate: 35.7 mmol/L — ABNORMAL HIGH (ref 20.0–28.0)
O2 Content: 6 L/min
O2 Saturation: 90.8 %
Patient temperature: 37
pCO2 arterial: 71 mmHg (ref 32–48)
pH, Arterial: 7.31 — ABNORMAL LOW (ref 7.35–7.45)
pO2, Arterial: 65 mmHg — ABNORMAL LOW (ref 83–108)

## 2022-12-16 LAB — LACTIC ACID, PLASMA: Lactic Acid, Venous: 1.1 mmol/L (ref 0.5–1.9)

## 2022-12-16 LAB — CORTISOL-AM, BLOOD: Cortisol - AM: 14.9 ug/dL (ref 6.7–22.6)

## 2022-12-16 LAB — HIV ANTIBODY (ROUTINE TESTING W REFLEX): HIV Screen 4th Generation wRfx: NONREACTIVE

## 2022-12-16 LAB — PROCALCITONIN: Procalcitonin: 0.29 ng/mL

## 2022-12-16 LAB — MAGNESIUM: Magnesium: 1.7 mg/dL (ref 1.7–2.4)

## 2022-12-16 MED ORDER — ENOXAPARIN SODIUM 40 MG/0.4ML IJ SOSY: 40.0000 mg | PREFILLED_SYRINGE | INTRAMUSCULAR | Status: DC

## 2022-12-16 MED ORDER — POLYSACCHARIDE IRON COMPLEX 150 MG PO CAPS
150.0000 mg | ORAL_CAPSULE | Freq: Every day | ORAL | Status: DC
  Administered 2022-12-16 – 2022-12-21 (×6): 150 mg via ORAL
  Filled 2022-12-16 (×6): qty 1

## 2022-12-16 MED ORDER — METOPROLOL SUCCINATE ER 50 MG PO TB24
50.0000 mg | ORAL_TABLET | Freq: Every day | ORAL | Status: DC
  Administered 2022-12-16 – 2022-12-21 (×6): 50 mg via ORAL
  Filled 2022-12-16 (×6): qty 1

## 2022-12-16 MED ORDER — INSULIN ASPART 100 UNIT/ML IJ SOLN
0.0000 [IU] | Freq: Three times a day (TID) | INTRAMUSCULAR | Status: DC
  Administered 2022-12-16 (×2): 4 [IU] via SUBCUTANEOUS
  Administered 2022-12-16: 3 [IU] via SUBCUTANEOUS
  Administered 2022-12-17: 4 [IU] via SUBCUTANEOUS
  Administered 2022-12-17: 3 [IU] via SUBCUTANEOUS
  Administered 2022-12-17 – 2022-12-18 (×2): 4 [IU] via SUBCUTANEOUS
  Administered 2022-12-18: 7 [IU] via SUBCUTANEOUS
  Administered 2022-12-19: 3 [IU] via SUBCUTANEOUS
  Administered 2022-12-19: 4 [IU] via SUBCUTANEOUS
  Administered 2022-12-20 (×2): 3 [IU] via SUBCUTANEOUS
  Administered 2022-12-20 – 2022-12-21 (×3): 4 [IU] via SUBCUTANEOUS
  Filled 2022-12-16 (×13): qty 1

## 2022-12-16 MED ORDER — PANTOPRAZOLE SODIUM 40 MG PO TBEC
40.0000 mg | DELAYED_RELEASE_TABLET | Freq: Every day | ORAL | Status: DC
  Administered 2022-12-16 – 2022-12-21 (×6): 40 mg via ORAL
  Filled 2022-12-16 (×6): qty 1

## 2022-12-16 MED ORDER — IPRATROPIUM-ALBUTEROL 0.5-2.5 (3) MG/3ML IN SOLN
3.0000 mL | Freq: Four times a day (QID) | RESPIRATORY_TRACT | Status: DC
  Administered 2022-12-16 – 2022-12-18 (×11): 3 mL via RESPIRATORY_TRACT
  Filled 2022-12-16 (×12): qty 3

## 2022-12-16 MED ORDER — SODIUM CHLORIDE 0.9 % IV SOLN
2.0000 g | INTRAVENOUS | Status: DC
  Administered 2022-12-16 – 2022-12-18 (×3): 2 g via INTRAVENOUS
  Filled 2022-12-16 (×3): qty 20

## 2022-12-16 MED ORDER — FUROSEMIDE 10 MG/ML IJ SOLN
40.0000 mg | Freq: Once | INTRAMUSCULAR | Status: AC
  Administered 2022-12-16: 40 mg via INTRAVENOUS
  Filled 2022-12-16: qty 4

## 2022-12-16 MED ORDER — INSULIN ASPART 100 UNIT/ML IJ SOLN
0.0000 [IU] | Freq: Every day | INTRAMUSCULAR | Status: DC
  Administered 2022-12-17: 2 [IU] via SUBCUTANEOUS
  Filled 2022-12-16: qty 1

## 2022-12-16 MED ORDER — METOPROLOL TARTRATE 5 MG/5ML IV SOLN: 5.0000 mg | INTRAVENOUS | Status: DC | PRN

## 2022-12-16 MED ORDER — ONDANSETRON HCL 4 MG/2ML IJ SOLN: 4.0000 mg | Freq: Four times a day (QID) | INTRAMUSCULAR | Status: DC | PRN

## 2022-12-16 MED ORDER — SENNOSIDES-DOCUSATE SODIUM 8.6-50 MG PO TABS
1.0000 | ORAL_TABLET | Freq: Two times a day (BID) | ORAL | Status: DC
  Administered 2022-12-16 – 2022-12-21 (×7): 1 via ORAL
  Filled 2022-12-16 (×11): qty 1

## 2022-12-16 MED ORDER — ENOXAPARIN SODIUM 100 MG/ML IJ SOSY
0.5000 mg/kg | PREFILLED_SYRINGE | INTRAMUSCULAR | Status: DC
  Administered 2022-12-16 – 2022-12-20 (×5): 90 mg via SUBCUTANEOUS
  Filled 2022-12-16 (×5): qty 0.9

## 2022-12-16 MED ORDER — ROSUVASTATIN CALCIUM 10 MG PO TABS
20.0000 mg | ORAL_TABLET | Freq: Every day | ORAL | Status: DC
  Administered 2022-12-16 – 2022-12-21 (×6): 20 mg via ORAL
  Filled 2022-12-16 (×2): qty 2
  Filled 2022-12-16 (×2): qty 1
  Filled 2022-12-16 (×2): qty 2

## 2022-12-16 MED ORDER — PREDNISONE 20 MG PO TABS: 40.0000 mg | ORAL_TABLET | Freq: Every day | ORAL | Status: DC

## 2022-12-16 MED ORDER — SENNOSIDES-DOCUSATE SODIUM 8.6-50 MG PO TABS
1.0000 | ORAL_TABLET | Freq: Every evening | ORAL | Status: DC | PRN
  Administered 2022-12-16 – 2022-12-20 (×4): 1 via ORAL

## 2022-12-16 MED ORDER — METHYLPREDNISOLONE SODIUM SUCC 40 MG IJ SOLR: 40.0000 mg | Freq: Two times a day (BID) | INTRAMUSCULAR | Status: DC

## 2022-12-16 MED ORDER — METHYLPREDNISOLONE SODIUM SUCC 125 MG IJ SOLR
80.0000 mg | INTRAMUSCULAR | Status: DC
  Administered 2022-12-16 – 2022-12-21 (×6): 80 mg via INTRAVENOUS
  Filled 2022-12-16 (×6): qty 2

## 2022-12-16 MED ORDER — FAMOTIDINE 20 MG PO TABS
20.0000 mg | ORAL_TABLET | Freq: Two times a day (BID) | ORAL | Status: DC
  Administered 2022-12-16 – 2022-12-21 (×11): 20 mg via ORAL
  Filled 2022-12-16 (×12): qty 1

## 2022-12-16 MED ORDER — ALPRAZOLAM 0.25 MG PO TABS: 0.2500 mg | ORAL_TABLET | Freq: Every day | ORAL | Status: DC | PRN

## 2022-12-16 MED ORDER — ACETAMINOPHEN 325 MG PO TABS: 650.0000 mg | ORAL_TABLET | Freq: Four times a day (QID) | ORAL | Status: DC | PRN

## 2022-12-16 MED ORDER — SODIUM CHLORIDE 0.9 % IV SOLN
500.0000 mg | INTRAVENOUS | Status: DC
  Administered 2022-12-16 – 2022-12-17 (×2): 500 mg via INTRAVENOUS
  Filled 2022-12-16 (×2): qty 5

## 2022-12-16 MED ORDER — ALBUTEROL SULFATE (2.5 MG/3ML) 0.083% IN NEBU: 2.5000 mg | INHALATION_SOLUTION | RESPIRATORY_TRACT | Status: DC | PRN

## 2022-12-16 MED ORDER — BUDESONIDE 0.5 MG/2ML IN SUSP
0.5000 mg | Freq: Two times a day (BID) | RESPIRATORY_TRACT | Status: DC
  Administered 2022-12-16 – 2022-12-21 (×11): 0.5 mg via RESPIRATORY_TRACT
  Filled 2022-12-16 (×11): qty 2

## 2022-12-16 MED ORDER — IPRATROPIUM-ALBUTEROL 0.5-2.5 (3) MG/3ML IN SOLN
3.0000 mL | RESPIRATORY_TRACT | Status: DC | PRN
  Administered 2022-12-16: 3 mL via RESPIRATORY_TRACT

## 2022-12-16 MED ORDER — ONDANSETRON HCL 4 MG PO TABS: 4.0000 mg | ORAL_TABLET | Freq: Four times a day (QID) | ORAL | Status: DC | PRN

## 2022-12-16 MED ORDER — HYDRALAZINE HCL 20 MG/ML IJ SOLN: 10.0000 mg | INTRAMUSCULAR | Status: DC | PRN

## 2022-12-16 MED ORDER — ACETAMINOPHEN 650 MG RE SUPP: 650.0000 mg | Freq: Four times a day (QID) | RECTAL | Status: DC | PRN

## 2022-12-16 MED ORDER — TRAZODONE HCL 50 MG PO TABS: 50.0000 mg | ORAL_TABLET | Freq: Every evening | ORAL | Status: DC | PRN

## 2022-12-16 MED ORDER — GUAIFENESIN-DM 100-10 MG/5ML PO SYRP
10.0000 mL | ORAL_SOLUTION | ORAL | Status: DC | PRN
  Filled 2022-12-16: qty 10

## 2022-12-16 MED ORDER — VITAMIN B-12 1000 MCG PO TABS
1000.0000 ug | ORAL_TABLET | Freq: Every day | ORAL | Status: DC
  Administered 2022-12-16 – 2022-12-21 (×6): 1000 ug via ORAL
  Filled 2022-12-16 (×2): qty 1
  Filled 2022-12-16 (×2): qty 2
  Filled 2022-12-16 (×2): qty 1

## 2022-12-16 MED ORDER — GUAIFENESIN ER 600 MG PO TB12
600.0000 mg | ORAL_TABLET | Freq: Two times a day (BID) | ORAL | Status: DC
  Administered 2022-12-16 – 2022-12-21 (×11): 600 mg via ORAL
  Filled 2022-12-16 (×12): qty 1

## 2022-12-16 NOTE — Progress Notes (Signed)
PHARMACIST - PHYSICIAN COMMUNICATION   CONCERNING: Methylprednisolone IV    Current order: Methylprednisolone IV 40mg  every 12 hours      DESCRIPTION: Per [place] Protocol:   IV methylprednisolone will be converted to either a q12h or q24h frequency with the same total daily dose (TDD).  Ordered Dose: 1 to 125 mg TDD; convert to: TDD q24h.  Ordered Dose: 126 to 250 mg TDD; convert to: TDD div q12h.  Ordered Dose: >250 mg TDD; DAW.  Order has been adjusted to: Methylprednisolone IV 80mg  every 24 hours   Pernell Dupre , PharmD, BCPS Clinical Pharmacist  12/16/2022 8:23 AM

## 2022-12-16 NOTE — Assessment & Plan Note (Addendum)
Received DuoNebs and Solu-Medrol in the ED Will continue scheduled and as needed nebulizers Continue IV steroids Additional management l listed under CAP

## 2022-12-16 NOTE — H&P (Signed)
History and Physical    Patient: Thomas Tanner [id number] DOB: 18-Jan-1951 DOA: 12/15/2022 DOS: the patient was seen and examined on 12/16/2022 PCP: Jonetta Osgood, NP  Patient coming from: Home  Chief Complaint:  Chief Complaint  Patient presents with   Shortness of Breath    HPI: Thomas Tanner is a 72 y.o. male with medical history significant for COPD on home O2 at 3 L, diastolic CHF, , chronic anticoagulation with Eliquis due to history of saddle PE, HTN, DM, morbid obesity with BMI 50 to 59% and history of Fournier's gangrene and chronic back pain who presents to the ED with 2-week history of cough congestion and shortness of breath that acutely worsened 2 days prior.  Son at the bedside contributes to history and states that he himself had the flu and his father developed mild flulike symptoms 2 weeks prior.  He states his was improving however 2 days ago he suddenly started worsening with shortness of breath, cough productive of greenish phlegm and confusion, thus prompting the visit to the ED.  Patient is awake and alert and also able to contribute to history.  He denies chest pain, abdominal pain, vomiting or diarrhea.  He denies lower extremity pain or swelling.  On arrival of EMS O2 sat was 81% on his usual flow rate of 3 L and he was placed on 5 L with improvement to 90. ED course and data review: Tmax 100.1 with pulse 98, respirations 21-28 with BP 95/60 and O2 sat 89% on 5 L.  He was briefly increased to 10 L but later down titrated to 4 L and maintaining sats around 92. VBG on 10 L showed pH of 7.36 and pCO2 67.  Labs otherwise significant for WBC of 11,700 with lactic acid 2.3-1.1.  Hemoglobin at baseline at 10.7.  BNP 207, respiratory viral panel negative for COVID flu and RSV.  CMP remarkable only for slightly elevated potassium of 5.2. EKG personally viewed and interpreted showing sinus at 93 with RBBB. Chest x-ray shows cardiomegaly with mild interstitial  edema and mild bibasilar atelectasis and/or infiltrate Patient was treated with Rocephin and azithromycin, DuoNebs and Solu-Medrol and given an LR bolus.  Hospitalist consulted for admission.   Review of Systems: As mentioned in the history of present illness. All other systems reviewed and are negative.  Past Medical History:  Diagnosis Date   Arthritis    CHF (congestive heart failure) (HCC)    COPD (chronic obstructive pulmonary disease) (HCC)    Diabetes ([place])    Hyperlipidemia    Hypertension    Kidney stone    Obesity    Tachycardia    TIA (transient ischemic attack)    Past Surgical History:  Procedure Laterality Date   CORONARY/GRAFT ACUTE MI REVASCULARIZATION N/A 12/08/2018   Procedure: Coronary/Graft Acute MI Revascularization;  Surgeon: Yolonda Kida, MD;  Location: [place] [facility];  Service: Cardiovascular;  Laterality: N/A;   INCISION AND DRAINAGE ABSCESS N/A 05/18/2021   Procedure: INCISION AND DRAINAGE SCROTAL ABSCESS, cystoscopy with urethral dilation, and complex catheter placement;  Surgeon: Billey Co, MD;  Location: [facility];  Service: Urology;  Laterality: N/A;   IRRIGATION AND DEBRIDEMENT ABSCESS N/A 05/20/2021   Procedure: IRRIGATION AND DEBRIDEMENT ABSCESS;  Surgeon: Abbie Sons, MD;  Location: [facility];  Service: Urology;  Laterality: N/A;   IRRIGATION AND DEBRIDEMENT ABSCESS N/A 05/21/2021   Procedure: IRRIGATION AND DEBRIDEMENT ABSCESS;  Surgeon: Abbie Sons, MD;  Location: [facility];  Service: Urology;  Laterality: N/A;   KIDNEY STONE SURGERY     left arm surgery  1963   LEFT HEART CATH AND CORONARY ANGIOGRAPHY N/A 12/08/2018   Procedure: LEFT HEART CATH AND CORONARY ANGIOGRAPHY;  Surgeon: Yolonda Kida, MD;  Location: [place] [facility];  Service: Cardiovascular;  Laterality: N/A;   PULMONARY THROMBECTOMY N/A 08/03/2020   Procedure: PULMONARY THROMBECTOMY / THROMBOLYSIS;  Surgeon: Algernon Huxley, MD;  Location: [place] [facility];  Service: Cardiovascular;  Laterality: N/A;   RECTAL EXAM UNDER ANESTHESIA N/A 05/20/2021   Procedure: EXAM UNDER ANESTHESIA-Dressing and Change;  Surgeon: Abbie Sons, MD;  Location: [facility];  Service: Urology;  Laterality: N/A;   Social History:  reports that he has been smoking cigarettes. He has been smoking an average of .5 packs per day. He has been exposed to tobacco smoke. He has never used smokeless tobacco. He reports that he does not drink alcohol and does not use drugs.  Allergies  Allergen Reactions   Morphine Anxiety and Other (See Comments)    Hallucinations  Pt states 6.17.22 that he is not allergic to this medication   Morphine And Related Anxiety    Family History  Problem Relation Age of Onset   Alzheimer's disease Mother    CAD Father     Prior to Admission medications   Medication Sig Start Date End Date Taking? Authorizing Provider  albuterol (VENTOLIN HFA) 108 (90 Base) MCG/ACT inhaler Inhale 2 puffs into the lungs every 6 (six) hours as needed for wheezing or shortness of breath. 07/17/22   Jonetta Osgood, NP  ALPRAZolam (XANAX) 0.25 MG tablet Take 1 tablet (0.25 mg total) by mouth daily as needed (severe anxiety). May take 1 additional dose after 2 hours if the first dose is not effective. 07/17/22   Jonetta Osgood, NP  famotidine (PEPCID) 20 MG tablet Take 1 tablet (20 mg total) by mouth 2 (two) times daily. 07/17/22   Jonetta Osgood, NP  iron polysaccharides (NIFEREX) 150 MG capsule Take 1 capsule (150 mg total) by mouth daily. 05/27/21   Sharen Hones, MD  lansoprazole (PREVACID) 30 MG capsule Take 1 capsule (30 mg total) by mouth daily before breakfast. 07/17/22   Jonetta Osgood, NP  meloxicam (MOBIC) 7.5 MG tablet Take 1 tablet (7.5 mg total) by mouth 2 (two) times daily. 09/18/22   Jonetta Osgood, NP  metoprolol succinate (TOPROL-XL) 50 MG 24 hr tablet Take 1 tablet (50 mg total) by mouth daily. Take with or immediately following a meal.  07/17/22   Abernathy, Yetta Flock, NP  OXYGEN Inhale 2.5 L into the lungs. Start PATIENT    [provider]  phenylephrine-shark liver oil-mineral oil-petrolatum (PREPARATION H) 0.25-14-74.9 % rectal ointment Place 1 application rectally 2 (two) times daily as needed for hemorrhoids. 05/16/21   Sidney Ace, MD  rosuvastatin (CRESTOR) 20 MG tablet Take 1 tablet (20 mg total) by mouth daily. 07/17/22   Jonetta Osgood, NP  senna-docusate (SENOKOT-S) 8.6-50 MG tablet Take 1 tablet by mouth 2 (two) times daily. 05/16/21   Sidney Ace, MD  vitamin B-12 1000 MCG tablet Take 1 tablet (1,000 mcg total) by mouth daily. 05/27/21   Sharen Hones, MD    Physical Exam: Vitals:   12/15/22 2146 12/15/22 2149 12/15/22 2150 12/15/22 2200  BP:   (!) 109/58 95/60  Pulse:    95  Resp:    (!) 28  Temp:  TempSrc:      SpO2:  95%  (!) 89%  Weight: (!) 178 kg      Physical Exam Vitals and nursing note reviewed.  Constitutional:      Appearance: He is morbidly obese.     Interventions: Nasal cannula in place.     Comments: Tachypneic and speaking in short sentences  HENT:     Head: Normocephalic and atraumatic.  Cardiovascular:     Rate and Rhythm: Normal rate and regular rhythm.     Heart sounds: Normal heart sounds.  Pulmonary:     Effort: Tachypnea present.     Breath sounds: Wheezing present.  Abdominal:     Palpations: Abdomen is soft.     Tenderness: There is no abdominal tenderness.  Neurological:     Mental Status: Mental status is at baseline.     Labs on Admission: I have personally reviewed following labs and imaging studies  CBC: Recent Labs  Lab 12/15/22 2156  WBC 11.7*  NEUTROABS 10.1*  HGB 10.7*  HCT 30.3*  MCV 89.9  PLT 205   Basic Metabolic Panel: Recent Labs  Lab 12/15/22 2156  NA 134*  K 5.2*  CL 96*  CO2 29  GLUCOSE 158*  BUN 18  CREATININE 1.14  CALCIUM 7.9*   GFR: Estimated Creatinine Clearance:  103 mL/min (by C-G formula based on SCr of 1.14 mg/dL). Liver Function Tests: Recent Labs  Lab 12/15/22 2156  AST 20  ALT 10  ALKPHOS 103  BILITOT 1.2  PROT 6.2*  ALBUMIN 2.9*   No results for input(s): "LIPASE", "AMYLASE" in the last 168 hours. No results for input(s): "AMMONIA" in the last 168 hours. Coagulation Profile: Recent Labs  Lab 12/15/22 2156  INR 1.4*   Cardiac Enzymes: No results for input(s): "CKTOTAL", "CKMB", "CKMBINDEX", "TROPONINI" in the last 168 hours. BNP (last 3 results) No results for input(s): "PROBNP" in the last 8760 hours. HbA1C: No results for input(s): "HGBA1C" in the last 72 hours. CBG: No results for input(s): "GLUCAP" in the last 168 hours. Lipid Profile: No results for input(s): "CHOL", "HDL", "LDLCALC", "TRIG", "CHOLHDL", "LDLDIRECT" in the last 72 hours. Thyroid Function Tests: No results for input(s): "TSH", "T4TOTAL", "FREET4", "T3FREE", "THYROIDAB" in the last 72 hours. Anemia Panel: No results for input(s): "VITAMINB12", "FOLATE", "FERRITIN", "TIBC", "IRON", "RETICCTPCT" in the last 72 hours. Urine analysis:    Component Value Date/Time   COLORURINE YELLOW (A) 06/10/2021 0204   APPEARANCEUR CLOUDY (A) 06/10/2021 0204   APPEARANCEUR Clear 06/10/2013 1212   LABSPEC 1.013 06/10/2021 0204   LABSPEC 1.019 06/10/2013 1212   PHURINE 6.0 06/10/2021 0204   GLUCOSEU NEGATIVE 06/10/2021 0204   GLUCOSEU Negative 06/10/2013 1212   HGBUR MODERATE (A) 06/10/2021 0204   BILIRUBINUR NEGATIVE 06/10/2021 0204   BILIRUBINUR Negative 06/10/2013 1212   KETONESUR 5 (A) 06/10/2021 0204   PROTEINUR 30 (A) 06/10/2021 0204   NITRITE POSITIVE (A) 06/10/2021 0204   LEUKOCYTESUR LARGE (A) 06/10/2021 0204   LEUKOCYTESUR Negative 06/10/2013 1212    Radiological Exams on Admission: DG Chest Port 1 View  Result Date: 12/15/2022 CLINICAL DATA:  Difficulty breathing. EXAM: PORTABLE CHEST 1 VIEW COMPARISON:  May 19, 2021 FINDINGS: There is stable mild to  moderate severity enlargement of the cardiac silhouette. Diffusely increased interstitial lung markings are seen with mild areas of atelectasis and/or infiltrate noted within the bilateral lung bases. There is no evidence of a pleural effusion or pneumothorax. The visualized skeletal structures are unremarkable. IMPRESSION: 1. Cardiomegaly with  mild interstitial edema. 2. Mild bibasilar atelectasis and/or infiltrate. Electronically Signed   By: Virgina Norfolk M.D.   On: 12/15/2022 22:35     Data Reviewed: Relevant notes from primary care and specialist visits, past discharge summaries as available in EHR, including Care Everywhere. Prior diagnostic testing as pertinent to current admission diagnoses Updated medications and problem lists for reconciliation ED course, including vitals, labs, imaging, treatment and response to treatment Triage notes, nursing and pharmacy notes and ED provider's notes Notable results as noted in HPI   Assessment and Plan: * CAP (community acquired pneumonia) Acute on chronic hypercapnic respiratory failure Sepsis, severe Sepsis criteria include fever, tachypnea, hypotension, leukocytosis with lactic acidosis, altered mental status Patient with increased O2 requirement increased work of breathing.  VBG with pCO2 of 68 Chest x-ray with possible edema, possible infiltrate Continue Rocephin and azithromycin Sepsis fluids Follow blood cultures.  Get sputum culture if possible Antitussives, incentive spirometry, flutter valve DuoNebs as needed   Chronic diastolic CHF (congestive heart failure) (HCC) BNP slightly elevated at 207 and chest x-ray showing mild interstitial edema Acute CHF not suspected at this time but given sepsis fluids we will monitor closely for fluid overload Last echo was in June 2022 with EF 55 to 60% Continue home metoprolol Not currently on Lasix but low threshold for IV Lasix if more evidence of fluid overload Daily weights with intake  and output monitoring Will repeat echo  COPD (chronic obstructive pulmonary disease) (HCC) Received DuoNebs and Solu-Medrol in the ED Will continue scheduled and as needed nebulizers Continue IV steroids Additional management l listed under CAP  Chronic anticoagulation History of saddle PE Continue Eliquis  Hypertension Continue metoprolol  Diabetes (HCC) Appears to diet controlled Sliding scale insulin coverage  Morbid obesity with BMI of 50.0-59.9, adult (HCC) Complicating factor to overall prognosis and care  History of Fournier's gangrene No acute issues suspected        DVT prophylaxis: apixaban  Consults: none  Advance Care Planning:   Code Status: Prior   Family Communication: son at bedside  Disposition Plan: Back to previous home environment  Severity of Illness: The appropriate patient status for this patient is INPATIENT. Inpatient status is judged to be reasonable and necessary in order to provide the required intensity of service to ensure the patient's safety. The patient's presenting symptoms, physical exam findings, and initial radiographic and laboratory data in the context of their chronic comorbidities is felt to place them at high risk for further clinical deterioration. Furthermore, it is not anticipated that the patient will be medically stable for discharge from the hospital within 2 midnights of admission.   * I certify that at the point of admission it is my clinical judgment that the patient will require inpatient hospital care spanning beyond 2 midnights from the point of admission due to high intensity of service, high risk for further deterioration and high frequency of surveillance required.*  Author: Athena Masse, MD 12/16/2022 12:21 AM  For on call review [email or web address].

## 2022-12-16 NOTE — Assessment & Plan Note (Signed)
BNP slightly elevated at 207 and chest x-ray showing mild interstitial edema Acute CHF not suspected at this time but given sepsis fluids we will monitor closely for fluid overload Last echo was in June 2022 with EF 55 to 60% Continue home metoprolol Not currently on Lasix but low threshold for IV Lasix if more evidence of fluid overload Daily weights with intake and output monitoring Will repeat echo

## 2022-12-16 NOTE — ED Notes (Signed)
Fluids stopped per MD and instructions to attempt to wean pt off of bipap. Will call RT for assistance.  Informed RN Rush Landmark

## 2022-12-16 NOTE — Assessment & Plan Note (Deleted)
Continue metoprolol and rosuvastatin

## 2022-12-16 NOTE — Evaluation (Signed)
Occupational Therapy Evaluation Patient Details Name: Thomas Tanner MRN: [id number] DOB: March 12, 1951 Today's Date: 12/16/2022   History of Present Illness Pt is a 72 year old male admitted with CAP, Acute on chronic hypercapnic respiratory failure, sepsis; PMH significant for COPD on home O2 at 3 L, diastolic CHF, , chronic anticoagulation with Eliquis due to history of saddle PE, HTN, DM, morbid obesity with BMI 50 to 59% and history of Fournier's gangrene and chronic back pain   Clinical Impression   Chart reviewed, pt greeted in bed agreeable to OT evaluation with encouragement. Co tx completed with PT on this date. Pt is alert and oriented x4 however fair-poor awareness of deficits. PTA pt reports assist for ADL as needed, assist for IADLs from wife/son. Pt is amb short household distances with RW, primarily using mwc. Pt presents with deficits in strength, endurance, activity tolerance, balance all affecting safe and optimal ADL completion. MIN A required for bed mobility, MIN-MOD A for STS, MAX A for peri care, MAX A for LB dressing required at this time. Pt declines further mobility, reporting generalized weakness. Recommend STR at this time to address functional deficits, pt reports he will not go to SNF STR. If pt chooses to return home, would recommend HHOT.      Recommendations for follow up therapy are one component of a multi-disciplinary discharge planning process, led by the attending physician.  Recommendations may be updated based on patient status, additional functional criteria and insurance authorization.   Follow Up Recommendations  Skilled nursing-short term rehab (<3 hours/day)     Assistance Recommended at Discharge Frequent or constant Supervision/Assistance  Patient can return home with the following A lot of help with walking and/or transfers;A lot of help with bathing/dressing/bathroom    Functional Status Assessment  Patient has had a recent decline in  their functional status and demonstrates the ability to make significant improvements in function in a reasonable and predictable amount of time.  Equipment Recommendations  None recommended by OT;Other (comment) (pt has recommended equipment)    Recommendations for Other Services       Precautions / Restrictions Precautions Precautions: Fall Restrictions Weight Bearing Restrictions: No      Mobility Bed Mobility Overal bed mobility: Needs Assistance Bed Mobility: Supine to Sit, Sit to Supine     Supine to sit: Mod assist, HOB elevated Sit to supine: Mod assist, HOB elevated   General bed mobility comments: assist for BLE    Transfers Overall transfer level: Needs assistance Equipment used:  (bari RW) Transfers: Sit to/from Stand Sit to Stand: Min assist, From elevated surface, Mod assist, +2 safety/equipment Two lateral steps up the bed to the L with Min A                   Balance Overall balance assessment: Needs assistance Sitting-balance support: Bilateral upper extremity supported, Feet supported Sitting balance-Leahy Scale: Fair     Standing balance support: Bilateral upper extremity supported, Reliant on assistive device for balance Standing balance-Leahy Scale: Poor                             ADL either performed or assessed with clinical judgement   ADL Overall ADL's : Needs assistance/impaired Eating/Feeding: Set up   Grooming: Wash/dry hands;Sitting;Set up   Upper Body Bathing: Maximal assistance Upper Body Bathing Details (indicate cue type and reason): due to body habitus Lower Body Bathing: Maximal assistance  Upper Body Dressing : Moderate assistance   Lower Body Dressing: Maximal assistance Lower Body Dressing Details (indicate cue type and reason): socks     Toileting- Clothing Manipulation and Hygiene: Maximal assistance Toileting - Clothing Manipulation Details (indicate cue type and reason): for peri care              Vision Patient Visual Report: No change from baseline       Perception     Praxis      Pertinent Vitals/Pain Pain Assessment Pain Assessment: No/denies pain     Hand Dominance Right   Extremity/Trunk Assessment Upper Extremity Assessment Upper Extremity Assessment: Generalized weakness   Lower Extremity Assessment Lower Extremity Assessment: Generalized weakness       Communication Communication Communication: No difficulties   Cognition Arousal/Alertness: Awake/alert Behavior During Therapy: WFL for tasks assessed/performed Overall Cognitive Status: No family/caregiver present to determine baseline cognitive functioning Area of Impairment: Following commands, Safety/judgement, Awareness, Problem solving                       Following Commands: Follows one step commands consistently Safety/Judgement: Decreased awareness of deficits Awareness: Emergent Problem Solving: Requires verbal cues, Requires tactile cues       General Comments  redness noted in skin folds, sacrum, scrotum    Exercises Other Exercises Other Exercises: edu re: role of OT, role of rehab, discharge recommendations, home safety, falls prevention, DME use   Shoulder Instructions      Home Living Family/patient expects to be discharged to:: Private residence Living Arrangements: Spouse/significant other;Children Available Help at Discharge: Family;Friend(s);Available 24 hours/day Type of Home: House Home Access: Stairs to enter [employer] of Steps: 4 Entrance Stairs-Rails: Right;Left;Can reach both Home Layout: One level     Bathroom Shower/Tub: Teacher, early years/pre: Standard Bathroom Accessibility: No   Home Equipment: Rollator (4 wheels);Shower seat;Wheelchair - manual;Hospital bed   Additional Comments: sponge bathes at baseline      Prior Functioning/Environment Prior Level of Function : Needs assist       Physical Assist :  Mobility (physical);ADLs (physical) Mobility (physical): Gait;Transfers;Stairs ADLs (physical): Dressing;Bathing;IADLs Mobility Comments: rollator and mwc household distances at baseline ADLs Comments: assist for IADLs from son and wife; pt manages meds per report; pt reports he sponge bathes, has occasional assist for dressing, grooming/feeding with SET UP-MOD I        OT Problem List: Decreased strength;Decreased activity tolerance;Impaired balance (sitting and/or standing);Decreased safety awareness;Decreased knowledge of precautions;Decreased knowledge of use of DME or AE      OT Treatment/Interventions: Self-care/ADL training;Patient/family education;Therapeutic exercise;Balance training;DME and/or AE instruction;Therapeutic activities    OT Goals(Current goals can be found in the care plan section) Acute Rehab OT Goals Patient Stated Goal: go home OT Goal Formulation: With patient Time For Goal Achievement: 12/30/22 Potential to Achieve Goals: Good ADL Goals Pt Will Perform Grooming: with supervision;sitting Pt Will Perform Lower Body Dressing: with min assist;sitting/lateral leans Pt Will Transfer to Toilet: with min assist Pt Will Perform Toileting - Clothing Manipulation and hygiene: with min assist  OT Frequency: Min 2X/week    Co-evaluation PT/OT/SLP Co-Evaluation/Treatment: Yes Reason for Co-Treatment: Complexity of the patient's impairments (multi-system involvement);Necessary to address cognition/behavior during functional activity;For patient/therapist safety;To address functional/ADL transfers PT goals addressed during session: Mobility/safety with mobility;Balance;Proper use of DME;Strengthening/ROM OT goals addressed during session: ADL's and self-care      AM-PAC OT "6 Clicks" Daily Activity     Outcome Measure Help  from another person eating meals?: None Help from another person taking care of personal grooming?: None Help from another person toileting, which  includes using toliet, bedpan, or urinal?: A Little Help from another person bathing (including washing, rinsing, drying)?: A Lot Help from another person to put on and taking off regular upper body clothing?: A Little Help from another person to put on and taking off regular lower body clothing?: A Lot 6 Click Score: 18   End of Session Equipment Utilized During Treatment: Gait belt;Oxygen (bari walker) Nurse Communication: Mobility status (pure wik malfunction)  Activity Tolerance: Patient tolerated treatment well Patient left: in bed;with call bell/phone within reach;with bed alarm set  OT Visit Diagnosis: Unsteadiness on feet (R26.81);Muscle weakness (generalized) (M62.81)                Time: 9371-6967 OT Time Calculation (min): 19 min Charges:  OT General Charges $OT Visit: 1 Visit OT Evaluation $OT Eval Moderate Complexity: 1 Mod  Oleta Mouse, OTD OTR/L  12/16/22, 3:03 PM

## 2022-12-16 NOTE — Assessment & Plan Note (Addendum)
Acute on chronic hypercapnic respiratory failure Sepsis, severe Sepsis criteria include fever, tachypnea, hypotension, leukocytosis with lactic acidosis, altered mental status Patient with increased O2 requirement increased work of breathing.  VBG with pCO2 of 68 Chest x-ray with possible edema, possible infiltrate Continue Rocephin and azithromycin Sepsis fluids Follow blood cultures.  Get sputum culture if possible Antitussives, incentive spirometry, flutter valve DuoNebs as needed

## 2022-12-16 NOTE — Progress Notes (Signed)
PHARMACIST - PHYSICIAN COMMUNICATION  CONCERNING:  Enoxaparin (Lovenox) for DVT Prophylaxis    RECOMMENDATION: Patient was prescribed enoxaprin 40mg  q24 hours for VTE prophylaxis.   Filed Weights   12/15/22 2146  Weight: (!) 178 kg (392 lb 6.7 oz)    Body mass index is 51.77 kg/m.  Estimated Creatinine Clearance: 103 mL/min (by C-G formula based on SCr of 1.14 mg/dL).   Based on [place] patient is candidate for enoxaparin 0.5mg /kg TBW SQ every 24 hours based on BMI being >30.   DESCRIPTION: Pharmacy has adjusted enoxaparin dose per [facility] policy.  Patient is now receiving enoxaparin 90 mg every 24 hours   Pernell Dupre, PharmD, BCPS Clinical Pharmacist 12/16/2022 10:37 AM

## 2022-12-16 NOTE — Assessment & Plan Note (Signed)
Complicating factor to overall prognosis and care 

## 2022-12-16 NOTE — ED Notes (Signed)
Pt taken off bipap and placed on 4L Thomas Tanner

## 2022-12-16 NOTE — Assessment & Plan Note (Signed)
History of saddle PE Continue Eliquis

## 2022-12-16 NOTE — Evaluation (Signed)
Physical Therapy Evaluation Patient Details Name: Thomas Tanner MRN: [id number] DOB: 09/08/1951 Today's Date: 12/16/2022  History of Present Illness  Patient is a 72 year old male who presents to ED with 2 week history of cough and SOB in addition to confusion. PMH includes COPD on home 02 at 3L, diastolic CHF, chronic anticoagulation with eliquis, HTN, DM, morbid obesity with BMI 50-59%, Fournier's gangrene, TIA, tachycardia,and chronic low back pain.   Clinical Impression  Patient is a 72 year old male who presents with generalized weakness and limited mobility. Prior to hospital admission, pt was utilizing a walker and wheelchair and lives with his wife and son in a one story home with 4 steps to enter. Recently patient has been unable to perform bathing in shower due to being unable to get in/out of it and has been using "sink" baths. Patient is in bed upon PT and OT arrival as co-treat is required for optimal patient performance and safety. Patient is agreeable to PT but does have intermittent agitation with history taking. Patient found to have wet the bed, cleaned up and was able to transfer EOB with Min/mod A. Patient perform STS with bariatric RW but has severe forward trunk flexion. He tolerates standing as sheets are changed and laterally steps to head of bed with max cueing for safety. Patient requires assistance back into bed. Nursing notified of catheter off of patient as well as mobility. Patient adamant against SNF placement however he is unsafe to return home at this time.  Pt would benefit from skilled PT to address noted impairments and functional limitations (see below for any additional details)       Recommendations for follow up therapy are one component of a multi-disciplinary discharge planning process, led by the attending physician.  Recommendations may be updated based on patient status, additional functional criteria and insurance authorization.  Follow Up  Recommendations Skilled nursing-short term rehab (<3 hours/day) (Patient does not want SNF however he would benefit from SNF placement due to limited mobility) Can patient physically be transported by private vehicle: No    Assistance Recommended at Discharge Frequent or constant Supervision/Assistance  Patient can return home with the following  A lot of help with walking and/or transfers;Two people to help with bathing/dressing/bathroom;Assistance with cooking/housework;Direct supervision/assist for medications management;Assist for transportation;Help with stairs or ramp for entrance    Equipment Recommendations None recommended by PT (patient has hospital bed, walker, and BSC at home)  Recommendations for Other Services       Functional Status Assessment Patient has had a recent decline in their functional status and/or demonstrates limited ability to make significant improvements in function in a reasonable and predictable amount of time     Precautions / Restrictions Precautions Precautions: Fall Restrictions Weight Bearing Restrictions: No      Mobility  Bed Mobility Overal bed mobility: Needs Assistance Bed Mobility: Supine to Sit, Sit to Supine     Supine to sit: Mod assist, HOB elevated Sit to supine: Mod assist, HOB elevated   General bed mobility comments: Patietn requires assistance for LE's to get into/out of bed.    Transfers Overall transfer level: Needs assistance Equipment used: Rolling walker (2 wheels) Transfers: Sit to/from Stand Sit to Stand: Min assist, From elevated surface, Mod assist           General transfer comment: requires cueing for safety and body mechanics    Ambulation/Gait Ambulation/Gait assistance: Min guard Gait Distance (Feet): 3 Feet Assistive device: Rolling walker (  2 wheels)         General Gait Details: side steps laterally on side of bed to the left.  Stairs            Wheelchair Mobility    Modified  Rankin (Stroke Patients Only)       Balance Overall balance assessment: Needs assistance Sitting-balance support: Bilateral upper extremity supported, Feet supported (for donning of gown.) Sitting balance-Leahy Scale: Fair Sitting balance - Comments: able to assist with donning of gown by raising one arm up at a time.   Standing balance support: Bilateral upper extremity supported, Reliant on assistive device for balance Standing balance-Leahy Scale: Poor Standing balance comment: heavy forward trunk reliance upon UE's.                             Pertinent Vitals/Pain Pain Assessment Pain Assessment: No/denies pain    Home Living Family/patient expects to be discharged to:: Private residence Living Arrangements: Spouse/significant other;Children Available Help at Discharge: Family;Friend(s);Available 24 hours/day Type of Home: House Home Access: Stairs to enter Entrance Stairs-Rails: Right;Left;Can reach both Entrance Stairs-Number of Steps: 4   Home Layout: One level Home Equipment: Rollator (4 wheels);Shower seat;Wheelchair - manual;Hospital bed Additional Comments: Patient reports he hasn't been able to take a bath in a while, cleans at the sink.    Prior Function Prior Level of Function : Needs assist       Physical Assist : Mobility (physical);ADLs (physical) Mobility (physical): Gait;Transfers;Stairs ADLs (physical): Dressing;Bathing;IADLs Mobility Comments: Patient utilizes a 4W walker and manual wheelchair for mobility at baseline ADLs Comments: Reports his wife and son do the cooking and household chores, occasionally helps him with dressing. He has not taken a shower in the shower for a while.     Hand Dominance   Dominant Hand: Right    Extremity/Trunk Assessment   Upper Extremity Assessment Upper Extremity Assessment: Defer to OT evaluation    Lower Extremity Assessment Lower Extremity Assessment: Generalized weakness (Grossly 4-/5  bilaterally however limited testing due to body habitus and patient intermittent agitation.)       Communication   Communication: No difficulties  Cognition Arousal/Alertness: Awake/alert Behavior During Therapy: WFL for tasks assessed/performed, Agitated Overall Cognitive Status: No family/caregiver present to determine baseline cognitive functioning                                 General Comments: Patient is A and O x 4, has intermittent agitation with history taking.        General Comments General comments (skin integrity, edema, etc.): patient has red sores inbetween skin flaps of abdominal/back region.    Exercises Other Exercises Other Exercises: Patient educated on role of PT in acute care setting, safe mobility and tranfers, side stepping.   Assessment/Plan    PT Assessment Patient needs continued PT services  PT Problem List Decreased strength;Decreased activity tolerance;Decreased mobility;Decreased balance;Obesity;Decreased skin integrity       PT Treatment Interventions DME instruction;Gait training;Stair training;Therapeutic exercise;Therapeutic activities;Functional mobility training;Balance training;Neuromuscular re-education;Manual techniques;Wheelchair mobility training;Patient/family education    PT Goals (Current goals can be found in the Care Plan section)  Acute Rehab PT Goals Patient Stated Goal: to return home PT Goal Formulation: With patient Time For Goal Achievement: 12/30/22 Potential to Achieve Goals: Fair    Frequency Min 2X/week     Co-evaluation PT/OT/SLP Co-Evaluation/Treatment: Yes Reason for Co-Treatment:  Complexity of the patient's impairments (multi-system involvement);Necessary to address cognition/behavior during functional activity;For patient/therapist safety;To address functional/ADL transfers PT goals addressed during session: Mobility/safety with mobility;Balance;Proper use of DME;Strengthening/ROM OT goals  addressed during session: ADL's and self-care;Proper use of Adaptive equipment and DME;Strengthening/ROM       AM-PAC PT "6 Clicks" Mobility  Outcome Measure Help needed turning from your back to your side while in a flat bed without using bedrails?: A Lot Help needed moving from lying on your back to sitting on the side of a flat bed without using bedrails?: A Little Help needed moving to and from a bed to a chair (including a wheelchair)?: A Lot Help needed standing up from a chair using your arms (e.g., wheelchair or bedside chair)?: A Lot Help needed to walk in hospital room?: A Lot Help needed climbing 3-5 steps with a railing? : Total 6 Click Score: 12    End of Session Equipment Utilized During Treatment: Gait belt;Oxygen (10 L of 02 via nasal cannula) Activity Tolerance: Patient tolerated treatment well Patient left: in bed;with call bell/phone within reach;with bed alarm set Nurse Communication: Mobility status;Other (comment) PT Visit Diagnosis: Unsteadiness on feet (R26.81);Other abnormalities of gait and mobility (R26.89);Muscle weakness (generalized) (M62.81);Difficulty in walking, not elsewhere classified (R26.2)    Time: 5852-7782 PT Time Calculation (min) (ACUTE ONLY): 18 min   Charges:   PT Evaluation $PT Eval Moderate Complexity: 1 Mod          Precious Bard, PT, DPT  12/16/2022, 2:16 PM

## 2022-12-16 NOTE — Progress Notes (Signed)
PROGRESS NOTE    Thomas Tanner  IRJ:[id number] DOB: [birth year] DOA: 12/15/2022 PCP: Jonetta Osgood, NP   Brief Narrative:  72 year old with history of COPD on 3 L nasal cannula, diastolic CHF, saddle PE on chronic Eliquis, HTN, DM 2, morbid obesity, history of Fournier's gangrene, chronic back pain comes to the hospital with 2 weeks of cough congestion and shortness of breath which acutely worsened 2 days prior to hospital admission.  Upon admission he was noted to be febrile with mild leukocytosis, lactic acidosis.  Routine COVID/RSV and flu were negative chest x-ray showed interstitial edema with some atelectasis.  Patient was started on Rocephin, azithromycin, bronchodilators and steroids.   Assessment & Plan:  Principal Problem:   CAP (community acquired pneumonia) Active Problems:   Acute on chronic respiratory failure with hypoxia and hypercapnia (HCC)   Sepsis (HCC)   Chronic diastolic CHF (congestive heart failure) (HCC)   COPD (chronic obstructive pulmonary disease) (HCC)   Chronic anticoagulation   History of saddle pulmonary embolus (PE)   Hypertension   Diabetes ([place])   Morbid obesity with BMI of 50.0-59.9, adult ([place])   History of Fournier's gangrene     Assessment and Plan: * CAP (community acquired pneumonia) Acute on chronic hypercapnic respiratory failure Sepsis, severe Sepsis physiology is improving.  COVID/RSV/flu was negative.  Procalcitonin mildly elevated.  Chest x-ray showing concerns of infiltrate/edema. - Rocephin/azithromycin, bronchodilators scheduled and as needed, I-S/flutter valve. -Check respiratory panel  Acute on chronic diastolic CHF (congestive heart failure) (HCC) Compared to previous BNP this time this quite elevated.  Initially also received sepsis fluid in the ED.  Will give Lasix 40 mg IV, monitor symptoms.  Stop IV fluids Echo 2022 showed EF 60%.  Repeat ordered by admitting provider  Acute COPD exacerbation (chronic  obstructive pulmonary disease) ([place]) Exacerbated by underlying pneumonia.  On steroids, bronchodilators  Mild hyperkalemia - Likely from steroid use - Getting Lasix  Chronic anticoagulation History of saddle PE He has been Off Eliquis since August 2023.   Hypertension Continue metoprolol.  IV as needed  Diabetes ([place]) Appears to diet controlled Sliding scale insulin coverage  Morbid obesity with BMI of 50.0-59.9, adult (HCC) Complicating factor to overall prognosis and care  History of Fournier's gangrene No acute issues suspected   DVT prophylaxis: Lovenox Code Status: Full  Family Communication:  Called wife, no answer. Daughter updated.   Status is: Inpatient  Cont hosp stay for resp distress   Subjective:  Still SOB but little better, remains on BiPAP. Tells me he has been off Eliquis since Aug 2023 as his tx was only suppose to be for a yr. His outpatient provider knows about it.   Examination:  General exam: Appears calm and comfortable. On BiPAP. obese Respiratory system: diffuse b/l rhonchi Cardiovascular system: S1 & S2 heard, RRR. No JVD, murmurs, rubs, gallops or clicks. No pedal edema. Gastrointestinal system: Abdomen is nondistended, soft and nontender. No organomegaly or masses felt. Normal bowel sounds heard. Central nervous system: Alert and oriented. No focal neurological deficits. Extremities: Symmetric 5 x 5 power. Skin: No rashes, lesions or ulcers Psychiatry: Judgement and insight appear normal. Mood & affect appropriate.     Objective: Vitals:   12/16/22 0600 12/16/22 0700 12/16/22 0717 12/16/22 0800  BP: 128/76 136/77  123/80  Pulse: 82 81  79  Resp: (!) 22 20  (!) 28  Temp:      TempSrc:      SpO2: 90% 92% 95% 93%  Weight:  No intake or output data in the 24 hours ending 12/16/22 0817 Filed Weights   12/15/22 2146  Weight: (!) 178 kg     Data Reviewed:   CBC: Recent Labs  Lab 12/15/22 2156  WBC 11.7*  NEUTROABS  10.1*  HGB 10.7*  HCT 30.3*  MCV 89.9  PLT 205   Basic Metabolic Panel: Recent Labs  Lab 12/15/22 2156  NA 134*  K 5.2*  CL 96*  CO2 29  GLUCOSE 158*  BUN 18  CREATININE 1.14  CALCIUM 7.9*   GFR: Estimated Creatinine Clearance: 103 mL/min (by C-G formula based on SCr of 1.14 mg/dL). Liver Function Tests: Recent Labs  Lab 12/15/22 2156  AST 20  ALT 10  ALKPHOS 103  BILITOT 1.2  PROT 6.2*  ALBUMIN 2.9*   No results for input(s): "LIPASE", "AMYLASE" in the last 168 hours. No results for input(s): "AMMONIA" in the last 168 hours. Coagulation Profile: Recent Labs  Lab 12/15/22 2156  INR 1.4*   Cardiac Enzymes: No results for input(s): "CKTOTAL", "CKMB", "CKMBINDEX", "TROPONINI" in the last 168 hours. BNP (last 3 results) No results for input(s): "PROBNP" in the last 8760 hours. HbA1C: No results for input(s): "HGBA1C" in the last 72 hours. CBG: Recent Labs  Lab 12/16/22 0408  GLUCAP 160*   Lipid Profile: No results for input(s): "CHOL", "HDL", "LDLCALC", "TRIG", "CHOLHDL", "LDLDIRECT" in the last 72 hours. Thyroid Function Tests: No results for input(s): "TSH", "T4TOTAL", "FREET4", "T3FREE", "THYROIDAB" in the last 72 hours. Anemia Panel: No results for input(s): "VITAMINB12", "FOLATE", "FERRITIN", "TIBC", "IRON", "RETICCTPCT" in the last 72 hours. Sepsis Labs: Recent Labs  Lab 12/15/22 2156 12/15/22 2325  PROCALCITON 0.29  --   LATICACIDVEN 2.3* 1.1    Recent Results (from the past 240 hour(s))  Blood Culture (routine x 2)     Status: None (Preliminary result)   Collection Time: 12/15/22  9:56 PM   Specimen: BLOOD  Result Value Ref Range Status   Specimen Description BLOOD BLOOD LEFT ARM  Final   Special Requests   Final    BOTTLES DRAWN AEROBIC AND ANAEROBIC Blood Culture results may not be optimal due to an inadequate volume of blood received in culture bottles   Culture   Final    NO GROWTH < 12 HOURS Performed at [facility], [address], [place], [place] [address]    Report Status PENDING  Incomplete  Blood Culture (routine x 2)     Status: None (Preliminary result)   Collection Time: 12/15/22  9:56 PM   Specimen: BLOOD  Result Value Ref Range Status   Specimen Description BLOOD BLOOD RIGHT ARM  Final   Special Requests   Final    BOTTLES DRAWN AEROBIC AND ANAEROBIC Blood Culture adequate volume   Culture   Final    NO GROWTH < 12 HOURS Performed at [facility], [address], [place], [place] [address]    Report Status PENDING  Incomplete  Resp panel by RT-PCR (RSV, Flu A&B, Covid) Anterior Nasal Swab     Status: None   Collection Time: 12/15/22 10:07 PM   Specimen: Anterior Nasal Swab  Result Value Ref Range Status   SARS Coronavirus 2 by RT PCR NEGATIVE NEGATIVE Final    Comment: (NOTE) SARS-CoV-2 target nucleic acids are NOT DETECTED.  The SARS-CoV-2 RNA is generally detectable in upper respiratory specimens during the acute phase of infection. The lowest concentration of SARS-CoV-2 viral copies this assay can detect is 138  copies/mL. A negative result does not preclude SARS-Cov-2 infection and should not be used as the sole basis for treatment or other patient management decisions. A negative result may occur with  improper specimen collection/handling, submission of specimen other than nasopharyngeal swab, presence of viral mutation(s) within the areas targeted by this assay, and inadequate number of viral copies(<138 copies/mL). A negative result must be combined with clinical observations, patient history, and epidemiological information. The expected result is Negative.  Fact Sheet for Patients:  BloggerCourse.com  Fact Sheet for Healthcare Providers:  SeriousBroker.it  This test is no t yet approved or cleared by the [place] FDA and  has been authorized for detection and/or diagnosis of SARS-CoV-2 by FDA under an  Emergency Use Authorization (EUA). This EUA will remain  in effect (meaning this test can be used) for the duration of the COVID-19 declaration under Section 564(b)(1) of the Act, 21 U.S.C.section 360bbb-3(b)(1), unless the authorization is terminated  or revoked sooner.       Influenza A by PCR NEGATIVE NEGATIVE Final   Influenza B by PCR NEGATIVE NEGATIVE Final    Comment: (NOTE) The Xpert Xpress SARS-CoV-2/FLU/RSV plus assay is intended as an aid in the diagnosis of influenza from Nasopharyngeal swab specimens and should not be used as a sole basis for treatment. Nasal washings and aspirates are unacceptable for Xpert Xpress SARS-CoV-2/FLU/RSV testing.  Fact Sheet for Patients: BloggerCourse.com  Fact Sheet for Healthcare Providers: SeriousBroker.it  This test is not yet approved or cleared by the [place] FDA and has been authorized for detection and/or diagnosis of SARS-CoV-2 by FDA under an Emergency Use Authorization (EUA). This EUA will remain in effect (meaning this test can be used) for the duration of the COVID-19 declaration under Section 564(b)(1) of the Act, 21 U.S.C. section 360bbb-3(b)(1), unless the authorization is terminated or revoked.     Resp Syncytial Virus by PCR NEGATIVE NEGATIVE Final    Comment: (NOTE) Fact Sheet for Patients: BloggerCourse.com  Fact Sheet for Healthcare Providers: SeriousBroker.it  This test is not yet approved or cleared by the [place] FDA and has been authorized for detection and/or diagnosis of SARS-CoV-2 by FDA under an Emergency Use Authorization (EUA). This EUA will remain in effect (meaning this test can be used) for the duration of the COVID-19 declaration under Section 564(b)(1) of the Act, 21 U.S.C. section 360bbb-3(b)(1), unless the authorization is terminated or revoked.  Performed at [facility], [address], [place], [place] [address]          Radiology Studies: [facility] Chest [place] 1 View  Result Date: 12/15/2022 CLINICAL DATA:  Difficulty breathing. EXAM: PORTABLE CHEST 1 VIEW COMPARISON:  May 19, 2021 FINDINGS: There is stable mild to moderate severity enlargement of the cardiac silhouette. Diffusely increased interstitial lung markings are seen with mild areas of atelectasis and/or infiltrate noted within the bilateral lung bases. There is no evidence of a pleural effusion or pneumothorax. The visualized skeletal structures are unremarkable. IMPRESSION: 1. Cardiomegaly with mild interstitial edema. 2. Mild bibasilar atelectasis and/or infiltrate. Electronically Signed   By: Aram Candela M.D.   On: 12/15/2022 22:35        Scheduled Meds:  famotidine  20 mg Oral BID   guaiFENesin  600 mg Oral BID   insulin aspart  0-20 Units Subcutaneous TID WC   insulin aspart  0-5 Units Subcutaneous QHS   ipratropium-albuterol  3 mL Nebulization Q6H   predniSONE  40 mg Oral Q breakfast  rosuvastatin  20 mg Oral Daily   Continuous Infusions:  azithromycin     cefTRIAXone (ROCEPHIN)  IV     lactated ringers 150 mL/hr at 12/15/22 2352     LOS: 1 day   Time spent= 35 mins    Chancelor Hardrick Joline Maxcy, MD [facility]  If 7PM-7AM, please contact night-coverage  12/16/2022, 8:17 AM

## 2022-12-16 NOTE — Assessment & Plan Note (Signed)
Continue metoprolol. 

## 2022-12-16 NOTE — Assessment & Plan Note (Signed)
No acute issues suspected 

## 2022-12-16 NOTE — Assessment & Plan Note (Addendum)
Appears to diet controlled Sliding scale insulin coverage

## 2022-12-17 ENCOUNTER — Encounter: Payer: Self-pay | Admitting: Internal Medicine

## 2022-12-17 ENCOUNTER — Other Ambulatory Visit: Payer: Self-pay

## 2022-12-17 DIAGNOSIS — J189 Pneumonia, unspecified organism: Secondary | ICD-10-CM | POA: Diagnosis not present

## 2022-12-17 LAB — EXPECTORATED SPUTUM ASSESSMENT W GRAM STAIN, RFLX TO RESP C

## 2022-12-17 LAB — BLOOD CULTURE ID PANEL (REFLEXED) - BCID2

## 2022-12-17 LAB — BASIC METABOLIC PANEL
Anion gap: 7 (ref 5–15)
BUN: 22 mg/dL (ref 8–23)
CO2: 33 mmol/L — ABNORMAL HIGH (ref 22–32)
Calcium: 8.3 mg/dL — ABNORMAL LOW (ref 8.9–10.3)
Chloride: 98 mmol/L (ref 98–111)
Creatinine, Ser: 0.93 mg/dL (ref 0.61–1.24)
GFR, Estimated: >60 mL/min (ref >60)
Glucose, Bld: 143 mg/dL — ABNORMAL HIGH (ref 70–99)
Potassium: 4.1 mmol/L (ref 3.5–5.1)
Sodium: 138 mmol/L (ref 135–145)

## 2022-12-17 LAB — CBC
HCT: 29.7 % — ABNORMAL LOW (ref 39.0–52.0)
Hemoglobin: 10.1 g/dL — ABNORMAL LOW (ref 13.0–17.0)
MCH: 29.8 pg (ref 26.0–34.0)
MCHC: 34 g/dL (ref 30.0–36.0)
MCV: 87.6 fL (ref 80.0–100.0)
Platelets: 219 K/uL (ref 150–400)
RBC: 3.39 MIL/uL — ABNORMAL LOW (ref 4.22–5.81)
RDW: 20.6 % — ABNORMAL HIGH (ref 11.5–15.5)
WBC: 11.3 K/uL — ABNORMAL HIGH (ref 4.0–10.5)
nRBC: 0 % (ref 0.0–0.2)

## 2022-12-17 LAB — CBG MONITORING, ED
Glucose-Capillary: 150 mg/dL — ABNORMAL HIGH (ref 70–99)
Glucose-Capillary: 157 mg/dL — ABNORMAL HIGH (ref 70–99)

## 2022-12-17 LAB — HEMOGLOBIN A1C
Hgb A1c MFr Bld: 5.8 % — ABNORMAL HIGH (ref 4.8–5.6)
Mean Plasma Glucose: 120 mg/dL

## 2022-12-17 LAB — URINE CULTURE: Culture: NO GROWTH

## 2022-12-17 LAB — GLUCOSE, CAPILLARY
Glucose-Capillary: 167 mg/dL — ABNORMAL HIGH (ref 70–99)
Glucose-Capillary: 240 mg/dL — ABNORMAL HIGH (ref 70–99)

## 2022-12-17 LAB — BRAIN NATRIURETIC PEPTIDE: B Natriuretic Peptide: 156 pg/mL — ABNORMAL HIGH (ref 0.0–100.0)

## 2022-12-17 MED ORDER — FUROSEMIDE 10 MG/ML IJ SOLN
40.0000 mg | Freq: Two times a day (BID) | INTRAMUSCULAR | Status: AC
  Administered 2022-12-17 (×2): 40 mg via INTRAVENOUS
  Filled 2022-12-17 (×2): qty 4

## 2022-12-17 NOTE — ED Notes (Signed)
Provider stated he decreased oxygen to 5lpm. Provider stated oxygen goal is 88-92%

## 2022-12-17 NOTE — ED Notes (Signed)
Pt resting in room. Male purewick on. Pt on cardiac, bp and pulse ox monitor.

## 2022-12-17 NOTE — ED Notes (Signed)
Provider at bedside and decreased oxygen to 7lpm. As per provider, if possible he would like to continue to decrease oxygen needs, if able

## 2022-12-17 NOTE — Progress Notes (Signed)
Nutrition Brief Note  RD consulted for assessment of nutritional requirements/ status.  Wt Readings from Last 15 Encounters:  12/15/22 (!) 178 kg  04/13/22 (!) 188.7 kg  12/14/21 (!) 188.2 kg  10/09/21 (!) 181.4 kg  10/02/21 (!) 181.4 kg  06/09/21 (!) 182 kg  05/30/21 (!) 181.4 kg  05/18/21 (!) 191 kg  05/11/21 (!) 189.5 kg  09/28/20 (!) 190.5 kg  08/19/20 (!) 190.5 kg  08/16/20 (!) 190.5 kg  08/11/20 (!) 190.5 kg  08/07/20 (!) 190.8 kg  08/01/20 (!) 190.5 kg   Pt with history of COPD on 3 L nasal cannula, diastolic CHF, saddle PE on chronic Eliquis, HTN, DM 2, morbid obesity, history of Fournier's gangrene, chronic back pain comes to the hospital with 2 weeks of cough congestion and shortness of breath which acutely worsened 2 days prior to hospital admission.   Pt admitted with CAP.   Reviewed I/O's: -2.4 L x 24 hours  Pt receiving personal care at time of visit.   Reviewed wt hx; pt has experienced a 5.7% wt loss over the past 7 months, which is not significant for time frame.   Body mass index is 51.77 kg/m. Patient meets criteria for obesity, class III based on current BMI. Obesity is a complex, chronic medical condition that is optimally managed by a multidisciplinary care team. Weight loss is not an ideal goal for an acute inpatient hospitalization. However, if further work-up for obesity is warranted, consider outpatient referral to outpatient bariatric service and/or [facility].    Current diet order is heart healthy, carb modified (liberalized to carb modified), patient is consuming approximately n/a% of meals at this time. Labs and medications reviewed.   No nutrition interventions warranted at this time. If nutrition issues arise, please consult RD.   Loistine Chance, RD, LDN, [place] Registered Dietitian II Certified Diabetes Care and Education Specialist Please refer to [facility] for RD and/or RD on-call/weekend/after hours pager

## 2022-12-17 NOTE — ED Notes (Signed)
Informed rn bed assigned 

## 2022-12-17 NOTE — Progress Notes (Signed)
PROGRESS NOTE    Thomas Tanner  CBJ:[id number] DOB: July 30, 1951 DOA: 12/15/2022 PCP: Sallyanne Kuster, NP   Brief Narrative:  72 year old with history of COPD on 3 L nasal cannula, diastolic CHF, saddle PE on chronic Eliquis, HTN, DM 2, morbid obesity, history of Fournier's gangrene, chronic back pain comes to the hospital with 2 weeks of cough congestion and shortness of breath which acutely worsened 2 days prior to hospital admission.  Upon admission he was noted to be febrile with mild leukocytosis, lactic acidosis.  Routine COVID/RSV and flu were negative chest x-ray showed interstitial edema with some atelectasis.  Patient was started on Rocephin, azithromycin, bronchodilators and steroids.   Assessment & Plan:  Principal Problem:   CAP (community acquired pneumonia) Active Problems:   Acute on chronic respiratory failure with hypoxia and hypercapnia (HCC)   Sepsis (HCC)   Chronic diastolic CHF (congestive heart failure) (HCC)   COPD (chronic obstructive pulmonary disease) (HCC)   Chronic anticoagulation   History of saddle pulmonary embolus (PE)   Hypertension   Diabetes (HCC)   Morbid obesity with BMI of 50.0-59.9, adult (HCC)   History of Fournier's gangrene     Assessment and Plan: * CAP (community acquired pneumonia) Acute on chronic hypercapnic respiratory failure Sepsis, severe Sepsis physiology is improving.  COVID/RSV/flu was negative.  Procalcitonin mildly elevated.  Chest x-ray showing concerns of infiltrate/edema. - Rocephin/azithromycin, bronchodilators scheduled and as needed, I-S/flutter valve. - Respiratory panel-pending  Bacteremia, E. coli - Blood cultures growing E. coli.  Already on Rocephin.  Follow-up sensitivities  Acute on chronic diastolic CHF (congestive heart failure) (HCC) Compared to previous BNP this time this quite elevated.  Initially also received sepsis fluid in the ED. IV fluids stopped.  Will give additional Lasix Echo 2022  showed EF 60%.    Acute COPD exacerbation (chronic obstructive pulmonary disease) (HCC) Exacerbated by underlying pneumonia.  On steroids, bronchodilators  Mild hyperkalemia - Likely from steroid use - Getting Lasix  Chronic anticoagulation History of saddle PE He has been Off Eliquis since August 2023.   Hypertension Continue metoprolol.  IV as needed  Diabetes (HCC) Appears to diet controlled Sliding scale insulin coverage  Morbid obesity with BMI of 50.0-59.9, adult (HCC) Complicating factor to overall prognosis and care  History of Fournier's gangrene No acute issues suspected   DVT prophylaxis: Lovenox Code Status: Full  Family Communication:  Wife, daughter and son did not answer.   Status is: Inpatient  Cont hosp stay for resp distress   Subjective: Still has exertional dyspnea. Remains on high flow. When I was in the room I was able to wean him down to 7L HF  Examination: Constitutional: Not in acute distress; 7L HF Respiratory: diminished BS b/l Cardiovascular: Normal sinus rhythm, no rubs Abdomen: Nontender nondistended good bowel sounds Musculoskeletal: No edema noted Skin: No rashes seen Neurologic: CN 2-12 grossly intact.  And nonfocal Psychiatric: Normal judgment and insight. Alert and oriented x 3. Normal mood.     Objective: Vitals:   12/17/22 0300 12/17/22 0404 12/17/22 0552 12/17/22 0600  BP: 129/66 92/67  (!) 131/58  Pulse: 89 93  85  Resp: 19 15  19   Temp:   98.8 F (37.1 C)   TempSrc:   Oral   SpO2: 91% 97%  96%  Weight:        Intake/Output Summary (Last 24 hours) at 12/17/2022 0912 Last data filed at 12/17/2022 0820 Gross per 24 hour  Intake --  Output 650 ml  Net -650 ml   Filed Weights   12/15/22 2146  Weight: (!) 178 kg     Data Reviewed:   CBC: Recent Labs  Lab 12/15/22 2156 12/17/22 0405  WBC 11.7* 11.3*  NEUTROABS 10.1*  --   HGB 10.7* 10.1*  HCT 30.3* 29.7*  MCV 89.9 87.6  PLT 205 219   Basic  Metabolic Panel: Recent Labs  Lab 12/15/22 2155 12/15/22 2156 12/17/22 0405  NA  --  134* 138  K  --  5.2* 4.1  CL  --  96* 98  CO2  --  29 33*  GLUCOSE  --  158* 143*  BUN  --  18 22  CREATININE  --  1.14 0.93  CALCIUM  --  7.9* 8.3*  MG 1.7  --   --    GFR: Estimated Creatinine Clearance: 126.3 mL/min (by C-G formula based on SCr of 0.93 mg/dL). Liver Function Tests: Recent Labs  Lab 12/15/22 2156  AST 20  ALT 10  ALKPHOS 103  BILITOT 1.2  PROT 6.2*  ALBUMIN 2.9*   No results for input(s): "LIPASE", "AMYLASE" in the last 168 hours. No results for input(s): "AMMONIA" in the last 168 hours. Coagulation Profile: Recent Labs  Lab 12/15/22 2156  INR 1.4*   Cardiac Enzymes: No results for input(s): "CKTOTAL", "CKMB", "CKMBINDEX", "TROPONINI" in the last 168 hours. BNP (last 3 results) No results for input(s): "PROBNP" in the last 8760 hours. HbA1C: No results for input(s): "HGBA1C" in the last 72 hours. CBG: Recent Labs  Lab 12/16/22 0921 12/16/22 1203 12/16/22 1632 12/16/22 2215 12/17/22 0719  GLUCAP 152* 149* 171* 147* 150*   Lipid Profile: No results for input(s): "CHOL", "HDL", "LDLCALC", "TRIG", "CHOLHDL", "LDLDIRECT" in the last 72 hours. Thyroid Function Tests: No results for input(s): "TSH", "T4TOTAL", "FREET4", "T3FREE", "THYROIDAB" in the last 72 hours. Anemia Panel: No results for input(s): "VITAMINB12", "FOLATE", "FERRITIN", "TIBC", "IRON", "RETICCTPCT" in the last 72 hours. Sepsis Labs: Recent Labs  Lab 12/15/22 2156 12/15/22 2325  PROCALCITON 0.29  --   LATICACIDVEN 2.3* 1.1    Recent Results (from the past 240 hour(s))  Blood Culture (routine x 2)     Status: None (Preliminary result)   Collection Time: 12/15/22  9:56 PM   Specimen: BLOOD  Result Value Ref Range Status   Specimen Description BLOOD BLOOD LEFT ARM  Final   Special Requests   Final    BOTTLES DRAWN AEROBIC AND ANAEROBIC Blood Culture results may not be optimal due to  an inadequate volume of blood received in culture bottles   Culture   Final    NO GROWTH 2 DAYS Performed at [facility], [address], [place], [place] [address]    Report Status PENDING  Incomplete  Blood Culture (routine x 2)     Status: None (Preliminary result)   Collection Time: 12/15/22  9:56 PM   Specimen: BLOOD  Result Value Ref Range Status   Specimen Description BLOOD BLOOD RIGHT ARM  Final   Special Requests   Final    BOTTLES DRAWN AEROBIC AND ANAEROBIC Blood Culture adequate volume   Culture  Setup Time   Final    GRAM NEGATIVE RODS AEROBIC BOTTLE ONLY Organism ID to follow CRITICAL RESULT CALLED TO, READ BACK BY AND VERIFIED WITH: JASON ROBBINS @ 0528 12/17/22 LFD Performed at [facility], [address], [place], [place] [address]    Culture GRAM NEGATIVE RODS  Final   Report Status PENDING  Incomplete  Blood Culture ID Panel (Reflexed)     Status: Abnormal   Collection Time: 12/15/22  9:56 PM  Result Value Ref Range Status   Enterococcus faecalis NOT DETECTED NOT DETECTED Final   Enterococcus Faecium NOT DETECTED NOT DETECTED Final   Listeria monocytogenes NOT DETECTED NOT DETECTED Final   Staphylococcus species NOT DETECTED NOT DETECTED Final   Staphylococcus aureus (BCID) NOT DETECTED NOT DETECTED Final   Staphylococcus epidermidis NOT DETECTED NOT DETECTED Final   Staphylococcus lugdunensis NOT DETECTED NOT DETECTED Final   Streptococcus species NOT DETECTED NOT DETECTED Final   Streptococcus agalactiae NOT DETECTED NOT DETECTED Final   Streptococcus pneumoniae NOT DETECTED NOT DETECTED Final   Streptococcus pyogenes NOT DETECTED NOT DETECTED Final   A.calcoaceticus-baumannii NOT DETECTED NOT DETECTED Final   Bacteroides fragilis NOT DETECTED NOT DETECTED Final   Enterobacterales DETECTED (A) NOT DETECTED Final    Comment: Enterobacterales represent a large order of gram negative bacteria, not a single organism. CRITICAL RESULT  CALLED TO, READ BACK BY AND VERIFIED WITH: JASON ROBBINS @ 0528 12/17/22 LFD    Enterobacter cloacae complex NOT DETECTED NOT DETECTED Final   Escherichia coli DETECTED (A) NOT DETECTED Final    Comment: CRITICAL RESULT CALLED TO, READ BACK BY AND VERIFIED WITH: JASON ROBBINS @ 0528 12/17/22 LFD    Klebsiella aerogenes NOT DETECTED NOT DETECTED Final   Klebsiella oxytoca NOT DETECTED NOT DETECTED Final   Klebsiella pneumoniae NOT DETECTED NOT DETECTED Final   Proteus species NOT DETECTED NOT DETECTED Final   Salmonella species NOT DETECTED NOT DETECTED Final   Serratia marcescens NOT DETECTED NOT DETECTED Final   Haemophilus influenzae NOT DETECTED NOT DETECTED Final   Neisseria meningitidis NOT DETECTED NOT DETECTED Final   Pseudomonas aeruginosa NOT DETECTED NOT DETECTED Final   Stenotrophomonas maltophilia NOT DETECTED NOT DETECTED Final   Candida albicans NOT DETECTED NOT DETECTED Final   Candida auris NOT DETECTED NOT DETECTED Final   Candida glabrata NOT DETECTED NOT DETECTED Final   Candida krusei NOT DETECTED NOT DETECTED Final   Candida parapsilosis NOT DETECTED NOT DETECTED Final   Candida tropicalis NOT DETECTED NOT DETECTED Final   Cryptococcus neoformans/gattii NOT DETECTED NOT DETECTED Final   CTX-M ESBL NOT DETECTED NOT DETECTED Final   Carbapenem resistance IMP NOT DETECTED NOT DETECTED Final   Carbapenem resistance KPC NOT DETECTED NOT DETECTED Final   Carbapenem resistance NDM NOT DETECTED NOT DETECTED Final   Carbapenem resist OXA 48 LIKE NOT DETECTED NOT DETECTED Final   Carbapenem resistance VIM NOT DETECTED NOT DETECTED Final    Comment: Performed at [facility], [address] [place], [place] [address]  Resp panel by RT-PCR (RSV, Flu A&B, Covid) Anterior Nasal Swab     Status: None   Collection Time: 12/15/22 10:07 PM   Specimen: Anterior Nasal Swab  Result Value Ref Range Status   SARS Coronavirus 2 by RT PCR NEGATIVE NEGATIVE Final     Comment: (NOTE) SARS-CoV-2 target nucleic acids are NOT DETECTED.  The SARS-CoV-2 RNA is generally detectable in upper respiratory specimens during the acute phase of infection. The lowest concentration of SARS-CoV-2 viral copies this assay can detect is 138 copies/mL. A negative result does not preclude SARS-Cov-2 infection and should not be used as the sole basis for treatment or other patient management decisions. A negative result may occur with  improper specimen collection/handling, submission of specimen other than nasopharyngeal swab, presence of viral mutation(s) within the areas targeted by this  assay, and inadequate number of viral copies(<138 copies/mL). A negative result must be combined with clinical observations, patient history, and epidemiological information. The expected result is Negative.  Fact Sheet for Patients:  EntrepreneurPulse.com.au  Fact Sheet for Healthcare Providers:  IncredibleEmployment.be  This test is no t yet approved or cleared by the [place] FDA and  has been authorized for detection and/or diagnosis of SARS-CoV-2 by FDA under an Emergency Use Authorization (EUA). This EUA will remain  in effect (meaning this test can be used) for the duration of the COVID-19 declaration under Section 564(b)(1) of the Act, 21 U.S.C.section 360bbb-3(b)(1), unless the authorization is terminated  or revoked sooner.       Influenza A by PCR NEGATIVE NEGATIVE Final   Influenza B by PCR NEGATIVE NEGATIVE Final    Comment: (NOTE) The Xpert Xpress SARS-CoV-2/FLU/RSV plus assay is intended as an aid in the diagnosis of influenza from Nasopharyngeal swab specimens and should not be used as a sole basis for treatment. Nasal washings and aspirates are unacceptable for Xpert Xpress SARS-CoV-2/FLU/RSV testing.  Fact Sheet for Patients: EntrepreneurPulse.com.au  Fact Sheet for Healthcare  Providers: IncredibleEmployment.be  This test is not yet approved or cleared by the [place] FDA and has been authorized for detection and/or diagnosis of SARS-CoV-2 by FDA under an Emergency Use Authorization (EUA). This EUA will remain in effect (meaning this test can be used) for the duration of the COVID-19 declaration under Section 564(b)(1) of the Act, 21 U.S.C. section 360bbb-3(b)(1), unless the authorization is terminated or revoked.     Resp Syncytial Virus by PCR NEGATIVE NEGATIVE Final    Comment: (NOTE) Fact Sheet for Patients: EntrepreneurPulse.com.au  Fact Sheet for Healthcare Providers: IncredibleEmployment.be  This test is not yet approved or cleared by the [place] FDA and has been authorized for detection and/or diagnosis of SARS-CoV-2 by FDA under an Emergency Use Authorization (EUA). This EUA will remain in effect (meaning this test can be used) for the duration of the COVID-19 declaration under Section 564(b)(1) of the Act, 21 U.S.C. section 360bbb-3(b)(1), unless the authorization is terminated or revoked.  Performed at [facility], [address], [place], [place] [address]          Radiology Studies: [facility] Chest [place] 1 View  Result Date: 12/15/2022 CLINICAL DATA:  Difficulty breathing. EXAM: PORTABLE CHEST 1 VIEW COMPARISON:  May 19, 2021 FINDINGS: There is stable mild to moderate severity enlargement of the cardiac silhouette. Diffusely increased interstitial lung markings are seen with mild areas of atelectasis and/or infiltrate noted within the bilateral lung bases. There is no evidence of a pleural effusion or pneumothorax. The visualized skeletal structures are unremarkable. IMPRESSION: 1. Cardiomegaly with mild interstitial edema. 2. Mild bibasilar atelectasis and/or infiltrate. Electronically Signed   By: Virgina Norfolk M.D.   On: 12/15/2022 22:35        Scheduled  Meds:  budesonide (PULMICORT) nebulizer solution  0.5 mg Nebulization BID   cyanocobalamin  1,000 mcg Oral Daily   enoxaparin (LOVENOX) injection  0.5 mg/kg Subcutaneous Q24H   famotidine  20 mg Oral BID   guaiFENesin  600 mg Oral BID   insulin aspart  0-20 Units Subcutaneous TID WC   insulin aspart  0-5 Units Subcutaneous QHS   ipratropium-albuterol  3 mL Nebulization Q6H   iron polysaccharides  150 mg Oral Daily   methylPREDNISolone (SOLU-MEDROL) injection  80 mg Intravenous Q24H   metoprolol succinate  50 mg Oral Daily   pantoprazole  40  mg Oral Daily   rosuvastatin  20 mg Oral Daily   senna-docusate  1 tablet Oral BID   Continuous Infusions:  azithromycin Stopped (12/17/22 0006)   cefTRIAXone (ROCEPHIN)  IV Stopped (12/16/22 2232)     LOS: 2 days   Time spent= 35 mins    Khamil Lamica Joline Maxcy, MD [facility]  If 7PM-7AM, please contact night-coverage  12/17/2022, 9:12 AM

## 2022-12-17 NOTE — Progress Notes (Signed)
PHARMACY - PHYSICIAN COMMUNICATION CRITICAL VALUE ALERT - BLOOD CULTURE IDENTIFICATION (BCID)  Thomas Tanner is an 72 y.o. male who presented to [facility] on 12/15/2022 with a chief complaint of CAP  Assessment:  E Coli in 1 of 4 bottles , no resistance (include suspected source if known)  Name of physician (or Provider) Contacted: Neomia Glass, NP  Current antibiotics: Ceftriaxone 2 gm IV Q24H , Azithromycin 500 mg IV Q24H   Changes to prescribed antibiotics recommended:  Will continue current abx   Results for orders placed or performed during the hospital encounter of 12/15/22  Blood Culture ID Panel (Reflexed) (Collected: 12/15/2022  9:56 PM)  Result Value Ref Range   Enterococcus faecalis NOT DETECTED NOT DETECTED   Enterococcus Faecium NOT DETECTED NOT DETECTED   Listeria monocytogenes NOT DETECTED NOT DETECTED   Staphylococcus species NOT DETECTED NOT DETECTED   Staphylococcus aureus (BCID) NOT DETECTED NOT DETECTED   Staphylococcus epidermidis NOT DETECTED NOT DETECTED   Staphylococcus lugdunensis NOT DETECTED NOT DETECTED   Streptococcus species NOT DETECTED NOT DETECTED   Streptococcus agalactiae NOT DETECTED NOT DETECTED   Streptococcus pneumoniae NOT DETECTED NOT DETECTED   Streptococcus pyogenes NOT DETECTED NOT DETECTED   A.calcoaceticus-baumannii NOT DETECTED NOT DETECTED   Bacteroides fragilis NOT DETECTED NOT DETECTED   Enterobacterales DETECTED (A) NOT DETECTED   Enterobacter cloacae complex NOT DETECTED NOT DETECTED   Escherichia coli DETECTED (A) NOT DETECTED   Klebsiella aerogenes NOT DETECTED NOT DETECTED   Klebsiella oxytoca NOT DETECTED NOT DETECTED   Klebsiella pneumoniae NOT DETECTED NOT DETECTED   Proteus species NOT DETECTED NOT DETECTED   Salmonella species NOT DETECTED NOT DETECTED   Serratia marcescens NOT DETECTED NOT DETECTED   Haemophilus influenzae NOT DETECTED NOT DETECTED   Neisseria meningitidis NOT DETECTED NOT DETECTED    Pseudomonas aeruginosa NOT DETECTED NOT DETECTED   Stenotrophomonas maltophilia NOT DETECTED NOT DETECTED   Candida albicans NOT DETECTED NOT DETECTED   Candida auris NOT DETECTED NOT DETECTED   Candida glabrata NOT DETECTED NOT DETECTED   Candida krusei NOT DETECTED NOT DETECTED   Candida parapsilosis NOT DETECTED NOT DETECTED   Candida tropicalis NOT DETECTED NOT DETECTED   Cryptococcus neoformans/gattii NOT DETECTED NOT DETECTED   CTX-M ESBL NOT DETECTED NOT DETECTED   Carbapenem resistance IMP NOT DETECTED NOT DETECTED   Carbapenem resistance KPC NOT DETECTED NOT DETECTED   Carbapenem resistance NDM NOT DETECTED NOT DETECTED   Carbapenem resist OXA 48 LIKE NOT DETECTED NOT DETECTED   Carbapenem resistance VIM NOT DETECTED NOT DETECTED    Jabin Tapp D 12/17/2022  5:58 AM

## 2022-12-17 NOTE — Progress Notes (Signed)
Physical Therapy Treatment Patient Details Name: Thomas Tanner MRN: [id number] DOB: 1951/07/12 Today's Date: 12/17/2022   History of Present Illness Pt is a 72 year old male admitted with CAP, Acute on chronic hypercapnic respiratory failure, sepsis; PMH significant for COPD on home O2 at 3 L, diastolic CHF, , chronic anticoagulation with Eliquis due to history of saddle PE, HTN, DM, morbid obesity with BMI 50 to 59% and history of Fournier's gangrene and chronic back pain    PT Comments    Pt seen for PT tx with pt agreeable despite noting significant fatigue. Pt demonstrates improvement in functional mobility as pt is able to complete bed mobility with mod I & STS & step pivot with RW & supervision. Pt declines further standing/gait attempts 2/2 fatigue. Due to improvement in mobility, updated d/c recommendations to HHPT & pt aware & in agreement.    Recommendations for follow up therapy are one component of a multi-disciplinary discharge planning process, led by the attending physician.  Recommendations may be updated based on patient status, additional functional criteria and insurance authorization.  Follow Up Recommendations  Home health PT Can patient physically be transported by private vehicle: Yes   Assistance Recommended at Discharge Set up Supervision/Assistance  Patient can return home with the following Assistance with cooking/housework;Assist for transportation;Help with stairs or ramp for entrance;A little help with walking and/or transfers;A little help with bathing/dressing/bathroom   Equipment Recommendations  None recommended by PT    Recommendations for Other Services       Precautions / Restrictions Precautions Precautions: Fall Restrictions Weight Bearing Restrictions: No     Mobility  Bed Mobility Overal bed mobility: Modified Independent Bed Mobility: Supine to Sit     Supine to sit: Modified independent (Device/Increase time), HOB elevated           Transfers Overall transfer level: Needs assistance Equipment used: Rolling walker (2 wheels) Transfers: Bed to chair/wheelchair/BSC, Sit to/from Stand Sit to Stand: Supervision   Step pivot transfers: Supervision (with RW)            Ambulation/Gait                   Stairs             Wheelchair Mobility    Modified Rankin (Stroke Patients Only)       Balance Overall balance assessment: Needs assistance Sitting-balance support: Bilateral upper extremity supported, Feet supported Sitting balance-Leahy Scale: Good     Standing balance support: Bilateral upper extremity supported, During functional activity, Reliant on assistive device for balance Standing balance-Leahy Scale: Fair                              Cognition Arousal/Alertness: Awake/alert Behavior During Therapy: WFL for tasks assessed/performed Overall Cognitive Status: Within Functional Limits for tasks assessed                                          Exercises General Exercises - Lower Extremity Long Arc Quad: AROM, Strengthening, Both, Seated (~8 reps each LE)    General Comments General comments (skin integrity, edema, etc.): Pt on 6L/min via nasal cannula throughout session, lowest SpO2 88% when pt received in bed in room.      Pertinent Vitals/Pain Pain Assessment Pain Assessment: No/denies pain    Home Living  Family/patient expects to be discharged to:: Skilled nursing facility Living Arrangements: Spouse/significant other;Children                      Prior Function            PT Goals (current goals can now be found in the care plan section) Acute Rehab PT Goals Patient Stated Goal: to return home PT Goal Formulation: With patient Time For Goal Achievement: 12/30/22 Potential to Achieve Goals: Good Progress towards PT goals: Progressing toward goals    Frequency    Min 2X/week      PT Plan Discharge plan  needs to be updated    Co-evaluation              AM-PAC PT "6 Clicks" Mobility   Outcome Measure  Help needed turning from your back to your side while in a flat bed without using bedrails?: A Little Help needed moving from lying on your back to sitting on the side of a flat bed without using bedrails?: A Little Help needed moving to and from a bed to a chair (including a wheelchair)?: A Little Help needed standing up from a chair using your arms (e.g., wheelchair or bedside chair)?: A Little Help needed to walk in hospital room?: A Little Help needed climbing 3-5 steps with a railing? : A Lot 6 Click Score: 17    End of Session Equipment Utilized During Treatment: Oxygen Activity Tolerance: Patient limited by fatigue;Patient tolerated treatment well Patient left: in chair;with call bell/phone within reach   PT Visit Diagnosis: Unsteadiness on feet (R26.81);Other abnormalities of gait and mobility (R26.89);Muscle weakness (generalized) (M62.81);Difficulty in walking, not elsewhere classified (R26.2)     Time: 7564-3329 PT Time Calculation (min) (ACUTE ONLY): 11 min  Charges:  $Therapeutic Activity: 8-22 mins                     Lavone Nian, PT, DPT 12/17/22, 10:45 AM   Waunita Schooner 12/17/2022, 10:44 AM

## 2022-12-18 DIAGNOSIS — J189 Pneumonia, unspecified organism: Secondary | ICD-10-CM | POA: Diagnosis not present

## 2022-12-18 LAB — RESPIRATORY PANEL BY PCR

## 2022-12-18 LAB — CBC
HCT: 31.7 % — ABNORMAL LOW (ref 39.0–52.0)
Hemoglobin: 10.8 g/dL — ABNORMAL LOW (ref 13.0–17.0)
MCH: 28.8 pg (ref 26.0–34.0)
MCHC: 34.1 g/dL (ref 30.0–36.0)
MCV: 84.5 fL (ref 80.0–100.0)
Platelets: 259 10*3/uL (ref 150–400)
RBC: 3.75 MIL/uL — ABNORMAL LOW (ref 4.22–5.81)
RDW: 20.7 % — ABNORMAL HIGH (ref 11.5–15.5)
WBC: 10.3 10*3/uL (ref 4.0–10.5)
nRBC: 0 % (ref 0.0–0.2)

## 2022-12-18 LAB — BASIC METABOLIC PANEL
Anion gap: 10 (ref 5–15)
BUN: 19 mg/dL (ref 8–23)
CO2: 34 mmol/L — ABNORMAL HIGH (ref 22–32)
Calcium: 8.2 mg/dL — ABNORMAL LOW (ref 8.9–10.3)
Chloride: 93 mmol/L — ABNORMAL LOW (ref 98–111)
Creatinine, Ser: 1.09 mg/dL (ref 0.61–1.24)
GFR, Estimated: >60 mL/min (ref >60)
Glucose, Bld: 113 mg/dL — ABNORMAL HIGH (ref 70–99)
Potassium: 3.5 mmol/L (ref 3.5–5.1)
Sodium: 137 mmol/L (ref 135–145)

## 2022-12-18 LAB — GLUCOSE, CAPILLARY
Glucose-Capillary: 113 mg/dL — ABNORMAL HIGH (ref 70–99)
Glucose-Capillary: 206 mg/dL — ABNORMAL HIGH (ref 70–99)
Glucose-Capillary: 185 mg/dL — ABNORMAL HIGH (ref 70–99)
Glucose-Capillary: 178 mg/dL — ABNORMAL HIGH (ref 70–99)

## 2022-12-18 MED ORDER — HYDROCORTISONE 1 % EX CREA
TOPICAL_CREAM | Freq: Four times a day (QID) | CUTANEOUS | Status: DC | PRN
  Filled 2022-12-18: qty 28

## 2022-12-18 MED ORDER — DIPHENHYDRAMINE HCL 25 MG PO CAPS
25.0000 mg | ORAL_CAPSULE | Freq: Once | ORAL | Status: AC
  Administered 2022-12-18: 25 mg via ORAL
  Filled 2022-12-18: qty 1

## 2022-12-18 MED ORDER — FUROSEMIDE 10 MG/ML IJ SOLN
40.0000 mg | Freq: Two times a day (BID) | INTRAMUSCULAR | Status: AC
  Administered 2022-12-18 (×2): 40 mg via INTRAVENOUS
  Filled 2022-12-18 (×2): qty 4

## 2022-12-18 MED ORDER — IPRATROPIUM-ALBUTEROL 0.5-2.5 (3) MG/3ML IN SOLN
3.0000 mL | Freq: Three times a day (TID) | RESPIRATORY_TRACT | Status: DC
  Administered 2022-12-19 – 2022-12-20 (×4): 3 mL via RESPIRATORY_TRACT
  Filled 2022-12-18 (×4): qty 3

## 2022-12-18 MED ORDER — POTASSIUM CHLORIDE 20 MEQ PO PACK
40.0000 meq | PACK | Freq: Once | ORAL | Status: AC
  Administered 2022-12-18: 40 meq via ORAL
  Filled 2022-12-18: qty 2

## 2022-12-18 NOTE — Progress Notes (Signed)
       CROSS COVER NOTE  NAME: Thomas Tanner MRN: [id number] DOB : 1951-03-17 ATTENDING PHYSICIAN: Athena Masse, MD    Date of Service   12/18/2022   HPI/Events of Note   Notified of localized pruritus on arm with Azithromycin currently infusing, no urticaria. RN reports patient declines to allow Azithromycin to continue infusing, even with offer of benadryl for itching.   Interventions   Assessment/Plan:  D/C Azithromycin, continue Rocephin Benadryl PRN Hydrocortisone      To reach the provider On-Call:   7AM- 7PM see care teams to locate the attending and reach out to them via [email or web address]. 7PM-7AM contact night-coverage If you still have difficulty reaching the appropriate provider, please page the [facility] (Director on Call) for [facility] on amion for assistance  This document was prepared using Set designer software and may include unintentional dictation errors.  Neomia Glass DNP, MBA, FNP-BC Nurse Practitioner [facility] [facility] Pager [phone]

## 2022-12-18 NOTE — TOC Initial Note (Signed)
Transition of Care [facility]) - Initial/Assessment Note    Patient Details  Name: Thomas Tanner MRN: [id number] Date of Birth: 1951-09-18  Transition of Care [facility]) CM/SW Contact:    Truddie Hidden, RN Phone Number: 12/18/2022, 12:49 PM  Clinical Narrative:                  Transition of Care [facility]) Screening Note   Patient Details  Name: Thomas Tanner Date of Birth: 03-07-1951   Transition of Care [facility]) CM/SW Contact:    Truddie Hidden, RN Phone Number: 12/18/2022, 12:49 PM    Transition of Care Department [facility]) has reviewed patient and no TOC needs have been identified at this time. We will continue to monitor patient advancement through interdisciplinary progression rounds. If new patient transition needs arise, please place a TOC consult.          Patient Goals and CMS Choice            Expected Discharge Plan and Services                                              Prior Living Arrangements/Services                       Activities of Daily Living Home Assistive Devices/Equipment: Oxygen, Eyeglasses ADL Screening (condition at time of admission) Patient's cognitive ability adequate to safely complete daily activities?: Yes Is the patient deaf or have difficulty hearing?: No Does the patient have difficulty seeing, even when wearing glasses/contacts?: No Does the patient have difficulty concentrating, remembering, or making decisions?: No Patient able to express need for assistance with ADLs?: Yes Does the patient have difficulty dressing or bathing?: No Independently performs ADLs?: Yes (appropriate for developmental age) Does the patient have difficulty walking or climbing stairs?: No Weakness of Legs: None Weakness of Arms/Hands: None  Permission Sought/Granted                  Emotional Assessment              Admission diagnosis:  Morbid obesity (HCC) [E66.01] CAP (community acquired pneumonia)  [J18.9] Acute on chronic respiratory failure with hypoxia (HCC) [J96.21] Acute on chronic respiratory failure with hypoxia and hypercapnia (HCC) [M01.02, J96.22] Patient Active Problem List   Diagnosis Date Noted   Diabetes (HCC) 12/16/2022   Chronic anticoagulation 12/16/2022   History of saddle pulmonary embolus (PE) 12/16/2022   CAP (community acquired pneumonia) 12/15/2022   Hyperlipidemia 11/08/2021   Tobacco abuse 11/08/2021   History of Fournier's gangrene 11/08/2021   Open wound of scrotum 11/08/2021   Scrotal bleeding 06/10/2021   Abscess 05/18/2021   Fournier's gangrene of scrotum 05/18/2021   Fournier gangrene 05/18/2021   Sepsis (HCC) 05/18/2021   Functional quadriplegia (HCC) 05/18/2021   Bacteremia due to Gram-negative bacteria 05/07/2021   Constipated 05/06/2021   Physical deconditioning 05/06/2021   Atherosclerosis of aorta (HCC) 09/28/2020   Acute on chronic heart failure (HCC) 08/02/2020   Acute saddle pulmonary embolism (HCC) 08/02/2020   Hypoxia 08/01/2020   Weakness 08/01/2020   Abnormality of gait and mobility 08/01/2020   AKI (acute kidney injury) (HCC) 07/21/2020   Hypotension 07/20/2020   Leukocytosis 07/20/2020   Cellulitis of right lower extremity 07/20/2020   Pressure injury of skin 07/12/2020   Acute on chronic respiratory failure  with hypoxia and hypercapnia ([place]) 07/10/2020   COPD (chronic obstructive pulmonary disease) (HCC)    Elevated troponin    Chronic diastolic CHF (congestive heart failure) ([place]) 05/24/2020   Anasarca    Acute renal failure superimposed on stage 3a chronic kidney disease ([place])    Shortness of breath 04/28/2020   Upper respiratory tract infection due to COVID-19 virus 04/28/2020   Edema 04/28/2020   Encounter for general adult medical examination with abnormal findings 01/15/2019   Acute non-recurrent maxillary sinusitis 01/15/2019   Gastroesophageal reflux disease without esophagitis 01/15/2019   CAD in native artery  12/16/2018   Morbid obesity with BMI of 50.0-59.9, adult ([place]) 12/16/2018   Cigarette smoker motivated to quit 12/16/2018   Chronic pain of right knee 08/04/2018   Mild intermittent asthma 08/04/2018   Impaired fasting glucose 04/03/2018   Low back pain with right-sided sciatica 03/05/2018   Chronic right-sided low back pain with right-sided sciatica 03/05/2018   Pain in right hip 03/05/2018   Hypertension 03/05/2018   Foreign body in bladder and urethra 06/09/2013   Kidney stone 06/09/2013   Ureteric stone 06/09/2013   Kidney stone 06/09/2013   PCP:  Jonetta Osgood, NP Pharmacy:   [facility] [address]), [place] [address] [place])  [address] Phone: [phone] Fax: [phone]  [facility] ([place], [place] [facility] [address] [place] [address] Phone: [phone] Fax: [phone]     Social Determinants of Health (SDOH) Social History: SDOH Screenings   Food Insecurity: No Food Insecurity (12/17/2022)  Housing: Low Risk  (12/17/2022)  Transportation Needs: No Transportation Needs (12/17/2022)  Utilities: Not At Risk (12/17/2022)  Alcohol Screen: Low Risk  (10/02/2021)  Depression (PHQ2-9): Low Risk  (10/02/2021)  Physical Activity: Unknown (12/08/2018)  Social Connections: Moderately Integrated (12/08/2018)  Stress: No Stress Concern Present (12/08/2018)  Tobacco Use: High Risk (12/17/2022)   SDOH Interventions:     Readmission Risk Interventions     No data to display

## 2022-12-18 NOTE — Care Management Important Message (Signed)
Important Message  Patient Details  Name: Thomas Tanner MRN: [id number] Date of Birth: 12/24/1950   Medicare Important Message Given:  N/A - LOS <3 / Initial given by admissions     Dannette Barbara 12/18/2022, 10:28 AM

## 2022-12-18 NOTE — Progress Notes (Signed)
PROGRESS NOTE    Thomas Tanner  UXL:[id number] DOB: 02-06-1951 DOA: 12/15/2022 PCP: Sallyanne Kuster, NP   Brief Narrative:  72 year old with history of COPD on 3 L nasal cannula, diastolic CHF, saddle PE on chronic Eliquis, HTN, DM 2, morbid obesity, history of Fournier's gangrene, chronic back pain comes to the hospital with 2 weeks of cough congestion and shortness of breath which acutely worsened 2 days prior to hospital admission.  Upon admission he was noted to be febrile with mild leukocytosis, lactic acidosis.  Routine COVID/RSV and flu were negative chest x-ray showed interstitial edema with some atelectasis.  Patient was started on Rocephin, azithromycin, bronchodilators and steroids.  Patient has been getting diuretics which is significantly improving his symptoms.   Assessment & Plan:  Principal Problem:   CAP (community acquired pneumonia) Active Problems:   Acute on chronic respiratory failure with hypoxia and hypercapnia (HCC)   Sepsis (HCC)   Chronic diastolic CHF (congestive heart failure) (HCC)   COPD (chronic obstructive pulmonary disease) (HCC)   Chronic anticoagulation   History of saddle pulmonary embolus (PE)   Hypertension   Diabetes (HCC)   Morbid obesity with BMI of 50.0-59.9, adult (HCC)   History of Fournier's gangrene     Assessment and Plan: * CAP (community acquired pneumonia) Acute on chronic hypercapnic respiratory failure Sepsis, severe Sepsis physiology is improving.  COVID/RSV/flu was negative.  Procalcitonin mildly elevated.  Chest x-ray showing concerns of infiltrate/edema. - Currently doing well on Rocephin.  Azithromycin discontinued.  Respiratory panel is negative  Bacteremia, E. coli - Blood cultures growing E. coli.  Continue Rocephin, follow-up sensitivities  Acute on chronic diastolic CHF (congestive heart failure) (HCC) Compared to previous BNP this time this quite elevated.  Initially also received sepsis fluid in the ED.  IV fluids stopped.  Continues to improve, will give additional 2 doses of IV Lasix Echo 2022 showed EF 60%.    Acute COPD exacerbation (chronic obstructive pulmonary disease) (HCC) Exacerbated by underlying pneumonia.  On steroids, bronchodilators  Mild hyperkalemia - Likely from steroid use - Getting Lasix  Chronic anticoagulation History of saddle PE He has been Off Eliquis since August 2023.   Hypertension Continue metoprolol.  IV as needed  Diabetes (HCC) Appears to diet controlled Sliding scale insulin coverage  Morbid obesity with BMI of 50.0-59.9, adult (HCC) Complicating factor to overall prognosis and care  History of Fournier's gangrene No acute issues suspected  PT/OT  DVT prophylaxis: Lovenox Code Status: Full  Family Communication: Family updated periodically  Status is: Inpatient  Cont hosp stay for resp distress   Subjective: Feeling well no complaints  Examination: Constitutional: Not in acute distress.  7 L high flow Respiratory: Bibasilar rhonchi Cardiovascular: Normal sinus rhythm, no rubs Abdomen: Nontender nondistended good bowel sounds Musculoskeletal: No edema noted Skin: No rashes seen Neurologic: CN 2-12 grossly intact.  And nonfocal Psychiatric: Normal judgment and insight. Alert and oriented x 3. Normal mood.   Objective: Vitals:   12/18/22 0759 12/18/22 0804 12/18/22 0809 12/18/22 1213  BP: 125/63  132/66 138/69  Pulse: 83  79 83  Resp:   18 18  Temp: 97.8 F (36.6 C)  98.2 F (36.8 C) 98.3 F (36.8 C)  TempSrc:   Oral Oral  SpO2: 97% 95% 96% 97%  Weight:        Intake/Output Summary (Last 24 hours) at 12/18/2022 1216 Last data filed at 12/18/2022 1206 Gross per 24 hour  Intake 216.19 ml  Output 5350 ml  Net -5133.81 ml   Filed Weights   12/15/22 2146  Weight: (!) 178 kg     Data Reviewed:   CBC: Recent Labs  Lab 12/15/22 2156 12/17/22 0405 12/18/22 0706  WBC 11.7* 11.3* 10.3  NEUTROABS 10.1*  --   --    HGB 10.7* 10.1* 10.8*  HCT 30.3* 29.7* 31.7*  MCV 89.9 87.6 84.5  PLT 205 219 619   Basic Metabolic Panel: Recent Labs  Lab 12/15/22 2155 12/15/22 2156 12/17/22 0405 12/18/22 0706  NA  --  134* 138 137  K  --  5.2* 4.1 3.5  CL  --  96* 98 93*  CO2  --  29 33* 34*  GLUCOSE  --  158* 143* 113*  BUN  --  18 22 19   CREATININE  --  1.14 0.93 1.09  CALCIUM  --  7.9* 8.3* 8.2*  MG 1.7  --   --   --    GFR: Estimated Creatinine Clearance: 106.2 mL/min (by C-G formula based on SCr of 1.09 mg/dL). Liver Function Tests: Recent Labs  Lab 12/15/22 2156  AST 20  ALT 10  ALKPHOS 103  BILITOT 1.2  PROT 6.2*  ALBUMIN 2.9*   No results for input(s): "LIPASE", "AMYLASE" in the last 168 hours. No results for input(s): "AMMONIA" in the last 168 hours. Coagulation Profile: Recent Labs  Lab 12/15/22 2156  INR 1.4*   Cardiac Enzymes: No results for input(s): "CKTOTAL", "CKMB", "CKMBINDEX", "TROPONINI" in the last 168 hours. BNP (last 3 results) No results for input(s): "PROBNP" in the last 8760 hours. HbA1C: Recent Labs    12/16/22 0412  HGBA1C 5.8*   CBG: Recent Labs  Lab 12/17/22 1220 12/17/22 1710 12/17/22 2107 12/18/22 0807 12/18/22 1212  GLUCAP 157* 167* 240* 113* 206*   Lipid Profile: No results for input(s): "CHOL", "HDL", "LDLCALC", "TRIG", "CHOLHDL", "LDLDIRECT" in the last 72 hours. Thyroid Function Tests: No results for input(s): "TSH", "T4TOTAL", "FREET4", "T3FREE", "THYROIDAB" in the last 72 hours. Anemia Panel: No results for input(s): "VITAMINB12", "FOLATE", "FERRITIN", "TIBC", "IRON", "RETICCTPCT" in the last 72 hours. Sepsis Labs: Recent Labs  Lab 12/15/22 2156 12/15/22 2325  PROCALCITON 0.29  --   LATICACIDVEN 2.3* 1.1    Recent Results (from the past 240 hour(s))  Blood Culture (routine x 2)     Status: Abnormal (Preliminary result)   Collection Time: 12/15/22  9:56 PM   Specimen: BLOOD LEFT ARM  Result Value Ref Range Status   Specimen  Description   Final    BLOOD LEFT ARM Performed at [facility], [address], [place], [place] [address]    Special Requests   Final    BOTTLES DRAWN AEROBIC AND ANAEROBIC Blood Culture results may not be optimal due to an inadequate volume of blood received in culture bottles Performed at [facility], [address] [place], [place] [address]    Culture  Setup Time   Final    GRAM NEGATIVE RODS AEROBIC BOTTLE ONLY CRITICAL VALUE NOTED.  VALUE IS CONSISTENT WITH PREVIOUSLY REPORTED AND CALLED VALUE. Performed at [facility], [address] [place], [place] [address]    Culture ESCHERICHIA COLI (A)  Final   Report Status PENDING  Incomplete  Blood Culture (routine x 2)     Status: Abnormal (Preliminary result)   Collection Time: 12/15/22  9:56 PM   Specimen: BLOOD  Result Value Ref Range Status   Specimen Description   Final    BLOOD  BLOOD RIGHT ARM Performed at [facility], [address] [place], [place] [address]    Special Requests   Final    BOTTLES DRAWN AEROBIC AND ANAEROBIC Blood Culture adequate volume Performed at [facility], [address] [place], [place] [address]    Culture  Setup Time   Final    GRAM NEGATIVE RODS AEROBIC BOTTLE ONLY CRITICAL RESULT CALLED TO, READ BACK BY AND VERIFIED WITH: JASON ROBBINS @ 0528 12/17/22 LFD    Culture (A)  Final    ESCHERICHIA COLI SUSCEPTIBILITIES TO FOLLOW Performed at [facility], [address] [place], [place] [address]    Report Status PENDING  Incomplete  Blood Culture ID Panel (Reflexed)     Status: Abnormal   Collection Time: 12/15/22  9:56 PM  Result Value Ref Range Status   Enterococcus faecalis NOT DETECTED NOT DETECTED Final   Enterococcus Faecium NOT DETECTED NOT DETECTED Final   Listeria monocytogenes NOT DETECTED NOT DETECTED Final   Staphylococcus species NOT DETECTED NOT DETECTED Final   Staphylococcus aureus (BCID) NOT DETECTED NOT  DETECTED Final   Staphylococcus epidermidis NOT DETECTED NOT DETECTED Final   Staphylococcus lugdunensis NOT DETECTED NOT DETECTED Final   Streptococcus species NOT DETECTED NOT DETECTED Final   Streptococcus agalactiae NOT DETECTED NOT DETECTED Final   Streptococcus pneumoniae NOT DETECTED NOT DETECTED Final   Streptococcus pyogenes NOT DETECTED NOT DETECTED Final   A.calcoaceticus-baumannii NOT DETECTED NOT DETECTED Final   Bacteroides fragilis NOT DETECTED NOT DETECTED Final   Enterobacterales DETECTED (A) NOT DETECTED Final    Comment: Enterobacterales represent a large order of gram negative bacteria, not a single organism. CRITICAL RESULT CALLED TO, READ BACK BY AND VERIFIED WITH: JASON ROBBINS @ 0528 12/17/22 LFD    Enterobacter cloacae complex NOT DETECTED NOT DETECTED Final   Escherichia coli DETECTED (A) NOT DETECTED Final    Comment: CRITICAL RESULT CALLED TO, READ BACK BY AND VERIFIED WITH: JASON ROBBINS @ 0528 12/17/22 LFD    Klebsiella aerogenes NOT DETECTED NOT DETECTED Final   Klebsiella oxytoca NOT DETECTED NOT DETECTED Final   Klebsiella pneumoniae NOT DETECTED NOT DETECTED Final   Proteus species NOT DETECTED NOT DETECTED Final   Salmonella species NOT DETECTED NOT DETECTED Final   Serratia marcescens NOT DETECTED NOT DETECTED Final   Haemophilus influenzae NOT DETECTED NOT DETECTED Final   Neisseria meningitidis NOT DETECTED NOT DETECTED Final   Pseudomonas aeruginosa NOT DETECTED NOT DETECTED Final   Stenotrophomonas maltophilia NOT DETECTED NOT DETECTED Final   Candida albicans NOT DETECTED NOT DETECTED Final   Candida auris NOT DETECTED NOT DETECTED Final   Candida glabrata NOT DETECTED NOT DETECTED Final   Candida krusei NOT DETECTED NOT DETECTED Final   Candida parapsilosis NOT DETECTED NOT DETECTED Final   Candida tropicalis NOT DETECTED NOT DETECTED Final   Cryptococcus neoformans/gattii NOT DETECTED NOT DETECTED Final   CTX-M ESBL NOT DETECTED NOT  DETECTED Final   Carbapenem resistance IMP NOT DETECTED NOT DETECTED Final   Carbapenem resistance KPC NOT DETECTED NOT DETECTED Final   Carbapenem resistance NDM NOT DETECTED NOT DETECTED Final   Carbapenem resist OXA 48 LIKE NOT DETECTED NOT DETECTED Final   Carbapenem resistance VIM NOT DETECTED NOT DETECTED Final    Comment: Performed at [facility], [address] [place], [place] [address]  Resp panel by RT-PCR (RSV, Flu A&B, Covid) Anterior Nasal Swab     Status: None   Collection Time: 12/15/22 10:07 PM  Specimen: Anterior Nasal Swab  Result Value Ref Range Status   SARS Coronavirus 2 by RT PCR NEGATIVE NEGATIVE Final    Comment: (NOTE) SARS-CoV-2 target nucleic acids are NOT DETECTED.  The SARS-CoV-2 RNA is generally detectable in upper respiratory specimens during the acute phase of infection. The lowest concentration of SARS-CoV-2 viral copies this assay can detect is 138 copies/mL. A negative result does not preclude SARS-Cov-2 infection and should not be used as the sole basis for treatment or other patient management decisions. A negative result may occur with  improper specimen collection/handling, submission of specimen other than nasopharyngeal swab, presence of viral mutation(s) within the areas targeted by this assay, and inadequate number of viral copies(<138 copies/mL). A negative result must be combined with clinical observations, patient history, and epidemiological information. The expected result is Negative.  Fact Sheet for Patients:  BloggerCourse.com  Fact Sheet for Healthcare Providers:  SeriousBroker.it  This test is no t yet approved or cleared by the [place] FDA and  has been authorized for detection and/or diagnosis of SARS-CoV-2 by FDA under an Emergency Use Authorization (EUA). This EUA will remain  in effect (meaning this test can be used) for the duration of the COVID-19  declaration under Section 564(b)(1) of the Act, 21 U.S.C.section 360bbb-3(b)(1), unless the authorization is terminated  or revoked sooner.       Influenza A by PCR NEGATIVE NEGATIVE Final   Influenza B by PCR NEGATIVE NEGATIVE Final    Comment: (NOTE) The Xpert Xpress SARS-CoV-2/FLU/RSV plus assay is intended as an aid in the diagnosis of influenza from Nasopharyngeal swab specimens and should not be used as a sole basis for treatment. Nasal washings and aspirates are unacceptable for Xpert Xpress SARS-CoV-2/FLU/RSV testing.  Fact Sheet for Patients: BloggerCourse.com  Fact Sheet for Healthcare Providers: SeriousBroker.it  This test is not yet approved or cleared by the [place] FDA and has been authorized for detection and/or diagnosis of SARS-CoV-2 by FDA under an Emergency Use Authorization (EUA). This EUA will remain in effect (meaning this test can be used) for the duration of the COVID-19 declaration under Section 564(b)(1) of the Act, 21 U.S.C. section 360bbb-3(b)(1), unless the authorization is terminated or revoked.     Resp Syncytial Virus by PCR NEGATIVE NEGATIVE Final    Comment: (NOTE) Fact Sheet for Patients: BloggerCourse.com  Fact Sheet for Healthcare Providers: SeriousBroker.it  This test is not yet approved or cleared by the [place] FDA and has been authorized for detection and/or diagnosis of SARS-CoV-2 by FDA under an Emergency Use Authorization (EUA). This EUA will remain in effect (meaning this test can be used) for the duration of the COVID-19 declaration under Section 564(b)(1) of the Act, 21 U.S.C. section 360bbb-3(b)(1), unless the authorization is terminated or revoked.  Performed at [facility], [address] [place], [place] [address]   Urine Culture     Status: None   Collection Time: 12/16/22  4:12 AM   Specimen:  In/Out Cath Urine  Result Value Ref Range Status   Specimen Description   Final    IN/OUT CATH URINE Performed at [facility], [address], [place], [place] [address]    Special Requests   Final    NONE Performed at [facility], [address], [place], [place] [address]    Culture   Final    NO GROWTH Performed at [facility], [address] [place], [place] [address]    Report Status  12/17/2022 FINAL  Final  Expectorated Sputum Assessment w Gram Stain, Rflx to Resp Cult     Status: None   Collection Time: 12/16/22  1:32 PM   Specimen: Sputum  Result Value Ref Range Status   Specimen Description SPUTUM  Final   Special Requests EXPSU  Final   Sputum evaluation   Final    THIS SPECIMEN IS ACCEPTABLE FOR SPUTUM CULTURE Performed at [facility], [address] [place], [place] [address]    Report Status 12/17/2022 FINAL  Final  Culture, Respiratory w Gram Stain     Status: None (Preliminary result)   Collection Time: 12/16/22  1:32 PM   Specimen: SPU  Result Value Ref Range Status   Specimen Description   Final    SPUTUM Performed at [facility], [address] [place], [place] [address]    Special Requests   Final    EXPSU Reflexed from [id number] Performed at [facility], [address] [place], [place] [address]    Gram Stain   Final    ABUNDANT SQUAMOUS EPITHELIAL CELLS PRESENT ABUNDANT WBC PRESENT,BOTH PMN AND MONONUCLEAR FEW GRAM POSITIVE COCCI IN PAIRS RARE GRAM POSITIVE RODS    Culture   Final    CULTURE REINCUBATED FOR BETTER GROWTH Performed at [facility], [address] [place], [place] [address]    Report Status PENDING  Incomplete  Respiratory (~20 pathogens) panel by PCR     Status: None   Collection Time: 12/17/22 12:12 AM   Specimen: Nasopharyngeal Swab; Respiratory  Result Value Ref Range Status   Adenovirus NOT DETECTED NOT DETECTED Final   Coronavirus 229E NOT DETECTED NOT  DETECTED Final    Comment: (NOTE) The Coronavirus on the Respiratory Panel, DOES NOT test for the novel  Coronavirus (2019 nCoV)    Coronavirus HKU1 NOT DETECTED NOT DETECTED Final   Coronavirus NL63 NOT DETECTED NOT DETECTED Final   Coronavirus OC43 NOT DETECTED NOT DETECTED Final   Metapneumovirus NOT DETECTED NOT DETECTED Final   Rhinovirus / Enterovirus NOT DETECTED NOT DETECTED Final   Influenza A NOT DETECTED NOT DETECTED Final   Influenza B NOT DETECTED NOT DETECTED Final   Parainfluenza Virus 1 NOT DETECTED NOT DETECTED Final   Parainfluenza Virus 2 NOT DETECTED NOT DETECTED Final   Parainfluenza Virus 3 NOT DETECTED NOT DETECTED Final   Parainfluenza Virus 4 NOT DETECTED NOT DETECTED Final   Respiratory Syncytial Virus NOT DETECTED NOT DETECTED Final   Bordetella pertussis NOT DETECTED NOT DETECTED Final   Bordetella Parapertussis NOT DETECTED NOT DETECTED Final   Chlamydophila pneumoniae NOT DETECTED NOT DETECTED Final   Mycoplasma pneumoniae NOT DETECTED NOT DETECTED Final    Comment: Performed at [facility], [address] [place], [place] [address]         Radiology Studies: No results found.      Scheduled Meds:  budesonide (PULMICORT) nebulizer solution  0.5 mg Nebulization BID   cyanocobalamin  1,000 mcg Oral Daily   enoxaparin (LOVENOX) injection  0.5 mg/kg Subcutaneous Q24H   famotidine  20 mg Oral BID   furosemide  40 mg Intravenous BID   guaiFENesin  600 mg Oral BID   insulin aspart  0-20 Units Subcutaneous TID WC   insulin aspart  0-5 Units Subcutaneous QHS   ipratropium-albuterol  3 mL Nebulization Q6H   iron polysaccharides  150 mg Oral Daily   methylPREDNISolone (SOLU-MEDROL) injection  80 mg Intravenous Q24H   metoprolol  succinate  50 mg Oral Daily   pantoprazole  40 mg Oral Daily   rosuvastatin  20 mg Oral Daily   senna-docusate  1 tablet Oral BID   Continuous Infusions:  cefTRIAXone (ROCEPHIN)  IV 2 g (12/17/22 2158)     LOS:  3 days   Time spent= 35 mins    Eleana Tocco Joline Maxcy, MD [facility]  If 7PM-7AM, please contact night-coverage  12/18/2022, 12:16 PM

## 2022-12-18 NOTE — Progress Notes (Signed)
Occupational Therapy Treatment Patient Details Name: Thomas Tanner MRN: [id number] DOB: 21-Jul-1951 Today's Date: 12/18/2022   History of present illness Pt is a 72 year old male admitted with CAP, Acute on chronic hypercapnic respiratory failure, sepsis; PMH significant for COPD on home O2 at 3 L, diastolic CHF, , chronic anticoagulation with Eliquis due to history of saddle PE, HTN, DM, morbid obesity with BMI 50 to 59% and history of Fournier's gangrene and chronic back pain   OT comments  Thomas Tanner was seen for OT treatment on this date. Upon arrival to room pt reclined in bed, agreeable to tx. Pt requires MOD A for LB access seated EOB, reports assist from family at d/c if needed. SBA + RW functional reaching inside BOS standing, poor tolerance. Pt removes O2 during mobility, stable low 90s in standing/sitting, return to supine desat 85% on RA. Resolves with rest, poor insight into deficits. Pt making good progress toward goals, will continue to follow POC. Discharge recommendation updated to reflect progress.    Recommendations for follow up therapy are one component of a multi-disciplinary discharge planning process, led by the attending physician.  Recommendations may be updated based on patient status, additional functional criteria and insurance authorization.    Follow Up Recommendations  Home health OT     Assistance Recommended at Discharge Frequent or constant Supervision/Assistance  Patient can return home with the following  A lot of help with walking and/or transfers;A lot of help with bathing/dressing/bathroom;Help with stairs or ramp for entrance   Equipment Recommendations  None recommended by OT    Recommendations for Other Services      Precautions / Restrictions Precautions Precautions: Fall Restrictions Weight Bearing Restrictions: No       Mobility Bed Mobility Overal bed mobility: Needs Assistance Bed Mobility: Supine to Sit, Sit to Supine      Supine to sit: Supervision, HOB elevated Sit to supine: Min assist, HOB elevated   General bed mobility comments: assist for BLE    Transfers Overall transfer level: Needs assistance Equipment used: Rolling walker (2 wheels) Transfers: Sit to/from Stand Sit to Stand: Min guard           General transfer comment: standing marchin steps, pt self-limits     Balance Overall balance assessment: Needs assistance Sitting-balance support: No upper extremity supported, Feet supported Sitting balance-Leahy Scale: Good     Standing balance support: Single extremity supported, During functional activity Standing balance-Leahy Scale: Fair                             ADL either performed or assessed with clinical judgement   ADL Overall ADL's : Needs assistance/impaired                                       General ADL Comments: MOD A for LB access seated EOB. SUPERVISION seated grooming tasks. SBA functional reaching inside BOS standing, poor tolerance      Cognition Arousal/Alertness: Awake/alert Behavior During Therapy: WFL for tasks assessed/performed Overall Cognitive Status: Impaired/Different from baseline Area of Impairment: Safety/judgement                         Safety/Judgement: Decreased awareness of safety, Decreased awareness of deficits     General Comments: poor insight into O2 needs  General Comments Received on 4.5L [place] SpO2 94% at rest, remained low 90s on RA sitting/standing (pt elects to remove stating he removes t/o day). Desat 85% with bed mobility, resolved with rest.    Pertinent Vitals/ Pain       Pain Assessment Pain Assessment: No/denies pain   Frequency  Min 2X/week        Progress Toward Goals  OT Goals(current goals can now be found in the care plan section)  Progress towards OT goals: Progressing toward goals  Acute Rehab OT Goals Patient Stated Goal: go home OT Goal  Formulation: With patient Time For Goal Achievement: 12/30/22 Potential to Achieve Goals: Good ADL Goals Pt Will Perform Grooming: with supervision;sitting Pt Will Perform Lower Body Dressing: with min assist;sitting/lateral leans Pt Will Transfer to Toilet: with min assist Pt Will Perform Toileting - Clothing Manipulation and hygiene: with min assist  Plan Frequency remains appropriate;Discharge plan needs to be updated    Co-evaluation                 AM-PAC OT "6 Clicks" Daily Activity     Outcome Measure   Help from another person eating meals?: None Help from another person taking care of personal grooming?: None Help from another person toileting, which includes using toliet, bedpan, or urinal?: A Little Help from another person bathing (including washing, rinsing, drying)?: A Lot Help from another person to put on and taking off regular upper body clothing?: A Little Help from another person to put on and taking off regular lower body clothing?: A Lot 6 Click Score: 18    End of Session    OT Visit Diagnosis: Unsteadiness on feet (R26.81);Muscle weakness (generalized) (M62.81)   Activity Tolerance Patient tolerated treatment well   Patient Left in bed;with call bell/phone within reach;with bed alarm set   Nurse Communication          Time: 6144-3154 OT Time Calculation (min): 9 min  Charges: OT General Charges $OT Visit: 1 Visit OT Treatments $Therapeutic Activity: 8-22 mins  Kathie Dike, M.S. OTR/L  12/18/22, 10:28 AM  ascom [phone]

## 2022-12-18 NOTE — Plan of Care (Signed)
  Problem: Education: Goal: Ability to describe self-care measures that may prevent or decrease complications (Diabetes Survival Skills Education) will improve Outcome: Progressing Goal: Individualized Educational Video(s) Outcome: Progressing   

## 2022-12-19 ENCOUNTER — Ambulatory Visit: Payer: Medicare Other | Admitting: Nurse Practitioner

## 2022-12-19 DIAGNOSIS — J9622 Acute and chronic respiratory failure with hypercapnia: Secondary | ICD-10-CM | POA: Diagnosis not present

## 2022-12-19 DIAGNOSIS — J9621 Acute and chronic respiratory failure with hypoxia: Secondary | ICD-10-CM | POA: Diagnosis not present

## 2022-12-19 LAB — CBC
HCT: 30.8 % — ABNORMAL LOW (ref 39.0–52.0)
Hemoglobin: 11.2 g/dL — ABNORMAL LOW (ref 13.0–17.0)
MCH: 31.2 pg (ref 26.0–34.0)
MCHC: 36.4 g/dL — ABNORMAL HIGH (ref 30.0–36.0)
MCV: 85.8 fL (ref 80.0–100.0)
Platelets: 226 10*3/uL (ref 150–400)
RBC: 3.59 MIL/uL — ABNORMAL LOW (ref 4.22–5.81)
RDW: 21.1 % — ABNORMAL HIGH (ref 11.5–15.5)
WBC: 8.8 10*3/uL (ref 4.0–10.5)
nRBC: 0 % (ref 0.0–0.2)

## 2022-12-19 LAB — CULTURE, BLOOD (ROUTINE X 2): Special Requests: ADEQUATE

## 2022-12-19 LAB — GLUCOSE, CAPILLARY
Glucose-Capillary: 101 mg/dL — ABNORMAL HIGH (ref 70–99)
Glucose-Capillary: 123 mg/dL — ABNORMAL HIGH (ref 70–99)
Glucose-Capillary: 177 mg/dL — ABNORMAL HIGH (ref 70–99)
Glucose-Capillary: 154 mg/dL — ABNORMAL HIGH (ref 70–99)

## 2022-12-19 LAB — BASIC METABOLIC PANEL
Anion gap: 8 (ref 5–15)
BUN: 23 mg/dL (ref 8–23)
CO2: 33 mmol/L — ABNORMAL HIGH (ref 22–32)
Calcium: 8.5 mg/dL — ABNORMAL LOW (ref 8.9–10.3)
Chloride: 95 mmol/L — ABNORMAL LOW (ref 98–111)
Creatinine, Ser: 1.16 mg/dL (ref 0.61–1.24)
GFR, Estimated: >60 mL/min (ref >60)
Glucose, Bld: 124 mg/dL — ABNORMAL HIGH (ref 70–99)
Potassium: 5.4 mmol/L — ABNORMAL HIGH (ref 3.5–5.1)
Sodium: 136 mmol/L (ref 135–145)

## 2022-12-19 LAB — MAGNESIUM: Magnesium: 2.3 mg/dL (ref 1.7–2.4)

## 2022-12-19 MED ORDER — SODIUM CHLORIDE 0.9 % IV SOLN
2.0000 g | INTRAVENOUS | Status: DC
  Administered 2022-12-19 – 2022-12-20 (×2): 2 g via INTRAVENOUS
  Filled 2022-12-19 (×3): qty 20

## 2022-12-19 MED ORDER — FUROSEMIDE 40 MG PO TABS
40.0000 mg | ORAL_TABLET | Freq: Every day | ORAL | Status: DC
  Administered 2022-12-20 – 2022-12-21 (×2): 40 mg via ORAL
  Filled 2022-12-19 (×2): qty 1

## 2022-12-19 NOTE — TOC Initial Note (Addendum)
Transition of Care [facility]) - Initial/Assessment Note    Patient Details  Name: Thomas Tanner MRN: [id number] Date of Birth: [birth year]  Transition of Care [facility]) CM/SW Contact:    Candie Chroman, LCSW Phone Number: 12/19/2022, 2:31 PM  Clinical Narrative:  CSW met with patient. No supports at bedside. CSW introduced role and explained that therapy recommendations would be discussed. Patient is agreeable to home health services at discharge. No agency preference. Will start search. No DME needs at this time. Patient is on 3 L chronic oxygen at home. Patient said brother will likely transport at discharge. Per PT, patient may need EMS. Patient confirmed he might but will wait until discharge date to determine. No further concerns. CSW encouraged patient to contact CSW as needed. CSW will continue to follow patient for support and facilitate return home once stable.           3:28 pm: [facility] has accepted referral for home health PT, OT, RN, aide. Tried calling patient in the room to notify but no answer.       Expected Discharge Plan: [place] Barriers to Discharge: Continued Medical Work up   Patient Goals and CMS Choice            Expected Discharge Plan and Services     Post Acute Care Choice: [place] arrangements for the past 2 months: Single Family Home                                      Prior Living Arrangements/Services Living arrangements for the past 2 months: Single Family Home Lives with:: Spouse, Adult Children Patient language and need for interpreter reviewed:: Yes Do you feel safe going back to the place where you live?: Yes      Need for Family Participation in Patient Care: Yes (Comment) Care giver support system in place?: Yes (comment) Current home services: DME Criminal Activity/Legal Involvement Pertinent to Current Situation/Hospitalization: No - Comment as needed  Activities of Daily Living Home  Assistive Devices/Equipment: Oxygen, Eyeglasses ADL Screening (condition at time of admission) Patient's cognitive ability adequate to safely complete daily activities?: Yes Is the patient deaf or have difficulty hearing?: No Does the patient have difficulty seeing, even when wearing glasses/contacts?: No Does the patient have difficulty concentrating, remembering, or making decisions?: No Patient able to express need for assistance with ADLs?: Yes Does the patient have difficulty dressing or bathing?: No Independently performs ADLs?: Yes (appropriate for developmental age) Does the patient have difficulty walking or climbing stairs?: No Weakness of Legs: None Weakness of Arms/Hands: None  Permission Sought/Granted Permission sought to share information with : Facility Art therapist granted to share information with : Yes, Verbal Permission Granted     Permission granted to share info w AGENCY: Home Health Agencies        Emotional Assessment Appearance:: Appears stated age Attitude/Demeanor/Rapport: Engaged, Gracious Affect (typically observed): Accepting, Appropriate, Calm, Pleasant Orientation: : Oriented to Self, Oriented to Place, Oriented to  Time, Oriented to Situation Alcohol / Substance Use: Not Applicable Psych Involvement: No (comment)  Admission diagnosis:  Morbid obesity ([place]) [E66.01] CAP (community acquired pneumonia) [J18.9] Acute on chronic respiratory failure with hypoxia (HCC) [J96.21] Acute on chronic respiratory failure with hypoxia and hypercapnia (HCC) [F81.82, J96.22] Patient Active Problem List   Diagnosis Date Noted   Diabetes ([place]) 12/16/2022   Chronic  anticoagulation 12/16/2022   History of saddle pulmonary embolus (PE) 12/16/2022   CAP (community acquired pneumonia) 12/15/2022   Hyperlipidemia 11/08/2021   Tobacco abuse 11/08/2021   History of Fournier's gangrene 11/08/2021   Open wound of scrotum 11/08/2021   Scrotal  bleeding 06/10/2021   Abscess 05/18/2021   Fournier's gangrene of scrotum 05/18/2021   Fournier gangrene 05/18/2021   Sepsis ([place]) 05/18/2021   Functional quadriplegia ([place]) 05/18/2021   Bacteremia due to Gram-negative bacteria 05/07/2021   Constipated 05/06/2021   Physical deconditioning 05/06/2021   Atherosclerosis of aorta ([place]) 09/28/2020   Acute on chronic heart failure ([place]) 08/02/2020   Acute saddle pulmonary embolism ([place]) 08/02/2020   Hypoxia 08/01/2020   Weakness 08/01/2020   Abnormality of gait and mobility 08/01/2020   AKI (acute kidney injury) ([place]) 07/21/2020   Hypotension 07/20/2020   Leukocytosis 07/20/2020   Cellulitis of right lower extremity 07/20/2020   Pressure injury of skin 07/12/2020   Acute on chronic respiratory failure with hypoxia and hypercapnia (HCC) 07/10/2020   COPD (chronic obstructive pulmonary disease) (HCC)    Elevated troponin    Chronic diastolic CHF (congestive heart failure) ([place]) 05/24/2020   Anasarca    Acute renal failure superimposed on stage 3a chronic kidney disease ([place])    Shortness of breath 04/28/2020   Upper respiratory tract infection due to COVID-19 virus 04/28/2020   Edema 04/28/2020   Encounter for general adult medical examination with abnormal findings 01/15/2019   Acute non-recurrent maxillary sinusitis 01/15/2019   Gastroesophageal reflux disease without esophagitis 01/15/2019   CAD in native artery 12/16/2018   Morbid obesity with BMI of 50.0-59.9, adult ([place]) 12/16/2018   Cigarette smoker motivated to quit 12/16/2018   Chronic pain of right knee 08/04/2018   Mild intermittent asthma 08/04/2018   Impaired fasting glucose 04/03/2018   Low back pain with right-sided sciatica 03/05/2018   Chronic right-sided low back pain with right-sided sciatica 03/05/2018   Pain in right hip 03/05/2018   Hypertension 03/05/2018   Foreign body in bladder and urethra 06/09/2013   Kidney stone 06/09/2013   Ureteric stone 06/09/2013    Kidney stone 06/09/2013   PCP:  Jonetta Osgood, NP Pharmacy:   [facility] [address]), [place] [address] [place]) [place] [address] Phone: [phone] Fax: [phone]  [facility] ([place], [facility] [address] [place] [address] Phone: [phone] Fax: [phone]     Social Determinants of Health (SDOH) Social History: SDOH Screenings   Food Insecurity: No Food Insecurity (12/17/2022)  Housing: Low Risk  (12/17/2022)  Transportation Needs: No Transportation Needs (12/17/2022)  Utilities: Not At Risk (12/17/2022)  Alcohol Screen: Low Risk  (10/02/2021)  Depression (PHQ2-9): Low Risk  (10/02/2021)  Physical Activity: Unknown (12/08/2018)  Social Connections: Moderately Integrated (12/08/2018)  Stress: No Stress Concern Present (12/08/2018)  Tobacco Use: High Risk (12/17/2022)   SDOH Interventions:     Readmission Risk Interventions     No data to display

## 2022-12-19 NOTE — Progress Notes (Signed)
PROGRESS NOTE    Thomas Tanner  ZOX:[id number] DOB: January 19, 1951 DOA: 12/15/2022 PCP: Jonetta Osgood, NP   Brief Narrative:  72 year old with history of COPD on 3 L nasal cannula, diastolic CHF, saddle PE on chronic Eliquis, HTN, DM 2, morbid obesity, history of Fournier's gangrene, chronic back pain comes to the hospital with 2 weeks of cough congestion and shortness of breath which acutely worsened 2 days prior to hospital admission.  Upon admission he was noted to be febrile with mild leukocytosis, lactic acidosis.  Routine COVID/RSV and flu were negative chest x-ray showed interstitial edema with some atelectasis.  Patient was started on Rocephin, azithromycin, bronchodilators and steroids.  1/10.  Patient is on 3 L of oxygen.  Leukocytosis has resolved.  Repeat blood cultures ordered.  E. coli bacteremia is pansensitive.    Assessment & Plan:  Principal Problem:   CAP (community acquired pneumonia) Active Problems:   Acute on chronic respiratory failure with hypoxia and hypercapnia (HCC)   Sepsis (HCC)   Chronic diastolic CHF (congestive heart failure) (HCC)   COPD (chronic obstructive pulmonary disease) (HCC)   Chronic anticoagulation   History of saddle pulmonary embolus (PE)   Hypertension   Diabetes ([place])   Morbid obesity with BMI of 50.0-59.9, adult ([place])   History of Fournier's gangrene     Assessment and Plan: * CAP (community acquired pneumonia) Acute on chronic hypercapnic respiratory failure Sepsis, severe Sepsis physiology is improving.  COVID/RSV/flu was negative.  Procalcitonin mildly elevated.  Chest x-ray showing concerns of infiltrate/edema. - Rocephin/azithromycin, bronchodilators scheduled and as needed, I-S/flutter valve. - Patient is on 3 L of oxygen via nasal cannula  Bacteremia, E. coli - Blood cultures growing E. coli.  Already on Rocephin.  E. coli is pansensitive.  Repeat blood cultures ordered today.  Source for E. coli bacteremia not  clear as E. coli is not a very common bug for causing community-acquired pneumonia.    Acute on chronic diastolic CHF (congestive heart failure) ([place]) Compared to previous BNP this time this quite elevated.  Initially also received sepsis fluid in the ED. IV fluids stopped.  Completed IV Lasix will place the patient on Lasix 40 mg daily p.o. echo 2022 showed EF 60%.    Acute COPD exacerbation (chronic obstructive pulmonary disease) (HCC) Exacerbated by underlying pneumonia.  On steroids, bronchodilators  Mild hyperkalemia - Likely from steroid use - Getting Lasix  Chronic anticoagulation History of saddle PE He has been Off Eliquis since August 2023.   Hypertension Continue metoprolol.  IV as needed  Diabetes ([place]) Appears to diet controlled Sliding scale insulin coverage  Morbid obesity with BMI of 50.0-59.9, adult (HCC) Complicating factor to overall prognosis and care  History of Fournier's gangrene No acute issues suspected   DVT prophylaxis: Lovenox Code Status: Full  Family Communication:  Wife, daughter and son did not answer.   Status is: Inpatient  Cont hosp stay for resp distress   Subjective: Patient seen and examined bedside today.  Today feels that his shortness of breath is much improved.  Oxygen decreased to 3 L via nasal cannula.   Examination: Constitutional: Not in acute distress; 7L HF Respiratory: diminished BS b/l Cardiovascular: Normal sinus rhythm, no rubs Abdomen: Nontender nondistended good bowel sounds Musculoskeletal: No edema noted Skin: No rashes seen Neurologic: CN 2-12 grossly intact.  And nonfocal Psychiatric: Normal judgment and insight. Alert and oriented x 3. Normal mood.     Objective: Vitals:   12/19/22 0729 12/19/22 0742 12/19/22 1125  12/19/22 1313  BP:  (!) 149/79 136/62   Pulse:  74 73   Resp:  18 18   Temp:  97.9 F (36.6 C) 97.8 F (36.6 C)   TempSrc:   Oral   SpO2: 97% 99% 93% 95%  Weight:         Intake/Output Summary (Last 24 hours) at 12/19/2022 1455 Last data filed at 12/19/2022 1125 Gross per 24 hour  Intake --  Output 3200 ml  Net -3200 ml    Filed Weights   12/15/22 2146  Weight: (!) 178 kg     Data Reviewed:   CBC: Recent Labs  Lab 12/15/22 2156 12/17/22 0405 12/18/22 0706 12/19/22 0508  WBC 11.7* 11.3* 10.3 8.8  NEUTROABS 10.1*  --   --   --   HGB 10.7* 10.1* 10.8* 11.2*  HCT 30.3* 29.7* 31.7* 30.8*  MCV 89.9 87.6 84.5 85.8  PLT 205 219 259 226    Basic Metabolic Panel: Recent Labs  Lab 12/15/22 2155 12/15/22 2156 12/17/22 0405 12/18/22 0706 12/19/22 0508 12/19/22 0805  NA  --  134* 138 137 136  --   K  --  5.2* 4.1 3.5 5.4*  --   CL  --  96* 98 93* 95*  --   CO2  --  29 33* 34* 33*  --   GLUCOSE  --  158* 143* 113* 124*  --   BUN  --  18 22 19 23   --   CREATININE  --  1.14 0.93 1.09 1.16  --   CALCIUM  --  7.9* 8.3* 8.2* 8.5*  --   MG 1.7  --   --   --   --  2.3    GFR: Estimated Creatinine Clearance: 99.8 mL/min (by C-G formula based on SCr of 1.16 mg/dL). Liver Function Tests: Recent Labs  Lab 12/15/22 2156  AST 20  ALT 10  ALKPHOS 103  BILITOT 1.2  PROT 6.2*  ALBUMIN 2.9*    No results for input(s): "LIPASE", "AMYLASE" in the last 168 hours. No results for input(s): "AMMONIA" in the last 168 hours. Coagulation Profile: Recent Labs  Lab 12/15/22 2156  INR 1.4*    Cardiac Enzymes: No results for input(s): "CKTOTAL", "CKMB", "CKMBINDEX", "TROPONINI" in the last 168 hours. BNP (last 3 results) No results for input(s): "PROBNP" in the last 8760 hours. HbA1C: No results for input(s): "HGBA1C" in the last 72 hours. CBG: Recent Labs  Lab 12/18/22 1212 12/18/22 1723 12/18/22 2131 12/19/22 0741 12/19/22 1133  GLUCAP 206* 185* 178* 101* 123*    Lipid Profile: No results for input(s): "CHOL", "HDL", "LDLCALC", "TRIG", "CHOLHDL", "LDLDIRECT" in the last 72 hours. Thyroid Function Tests: No results for input(s):  "TSH", "T4TOTAL", "FREET4", "T3FREE", "THYROIDAB" in the last 72 hours. Anemia Panel: No results for input(s): "VITAMINB12", "FOLATE", "FERRITIN", "TIBC", "IRON", "RETICCTPCT" in the last 72 hours. Sepsis Labs: Recent Labs  Lab 12/15/22 2156 12/15/22 2325  PROCALCITON 0.29  --   LATICACIDVEN 2.3* 1.1     Recent Results (from the past 240 hour(s))  Blood Culture (routine x 2)     Status: Abnormal   Collection Time: 12/15/22  9:56 PM   Specimen: BLOOD LEFT ARM  Result Value Ref Range Status   Specimen Description   Final    BLOOD LEFT ARM Performed at [facility], [address] [place], [place]    Special Requests   Final    BOTTLES DRAWN AEROBIC AND ANAEROBIC  Blood Culture results may not be optimal due to an inadequate volume of blood received in culture bottles Performed at [facility], [address] [place], [place] [address]    Culture  Setup Time   Final    GRAM NEGATIVE RODS AEROBIC BOTTLE ONLY CRITICAL VALUE NOTED.  VALUE IS CONSISTENT WITH PREVIOUSLY REPORTED AND CALLED VALUE. Performed at [facility], [address] [place], [place] [address]    Culture (A)  Final    ESCHERICHIA COLI SUSCEPTIBILITIES PERFORMED ON PREVIOUS CULTURE WITHIN THE LAST 5 DAYS. Performed at [facility], [address] [place], [place] [address]    Report Status 12/19/2022 FINAL  Final  Blood Culture (routine x 2)     Status: Abnormal   Collection Time: 12/15/22  9:56 PM   Specimen: BLOOD RIGHT ARM  Result Value Ref Range Status   Specimen Description   Final    BLOOD RIGHT ARM Performed at [facility], [address], [place], [place] [address]    Special Requests   Final    BOTTLES DRAWN AEROBIC AND ANAEROBIC Blood Culture adequate volume Performed at [facility], [address] [place], [place] [address]    Culture  Setup Time   Final    GRAM NEGATIVE RODS IN BOTH AEROBIC AND ANAEROBIC BOTTLES CRITICAL RESULT  CALLED TO, READ BACK BY AND VERIFIED WITH: JASON ROBBINS @ 0528 12/17/22 LFD Performed at [facility], [address] [place], [place] [address]    Culture ESCHERICHIA COLI (A)  Final   Report Status 12/19/2022 FINAL  Final   Organism ID, Bacteria ESCHERICHIA COLI  Final      Susceptibility   Escherichia coli - MIC*    AMPICILLIN <=2 SENSITIVE Sensitive     CEFAZOLIN <=4 SENSITIVE Sensitive     CEFEPIME <=0.12 SENSITIVE Sensitive     CEFTAZIDIME <=1 SENSITIVE Sensitive     CEFTRIAXONE <=0.25 SENSITIVE Sensitive     CIPROFLOXACIN <=0.25 SENSITIVE Sensitive     GENTAMICIN <=1 SENSITIVE Sensitive     IMIPENEM <=0.25 SENSITIVE Sensitive     TRIMETH/SULFA <=20 SENSITIVE Sensitive     AMPICILLIN/SULBACTAM <=2 SENSITIVE Sensitive     PIP/TAZO <=4 SENSITIVE Sensitive     * ESCHERICHIA COLI  Blood Culture ID Panel (Reflexed)     Status: Abnormal   Collection Time: 12/15/22  9:56 PM  Result Value Ref Range Status   Enterococcus faecalis NOT DETECTED NOT DETECTED Final   Enterococcus Faecium NOT DETECTED NOT DETECTED Final   Listeria monocytogenes NOT DETECTED NOT DETECTED Final   Staphylococcus species NOT DETECTED NOT DETECTED Final   Staphylococcus aureus (BCID) NOT DETECTED NOT DETECTED Final   Staphylococcus epidermidis NOT DETECTED NOT DETECTED Final   Staphylococcus lugdunensis NOT DETECTED NOT DETECTED Final   Streptococcus species NOT DETECTED NOT DETECTED Final   Streptococcus agalactiae NOT DETECTED NOT DETECTED Final   Streptococcus pneumoniae NOT DETECTED NOT DETECTED Final   Streptococcus pyogenes NOT DETECTED NOT DETECTED Final   A.calcoaceticus-baumannii NOT DETECTED NOT DETECTED Final   Bacteroides fragilis NOT DETECTED NOT DETECTED Final   Enterobacterales DETECTED (A) NOT DETECTED Final    Comment: Enterobacterales represent a large order of gram negative bacteria, not a single organism. CRITICAL RESULT CALLED TO, READ BACK BY AND VERIFIED WITH: JASON  ROBBINS @ 0528 12/17/22 LFD    Enterobacter cloacae complex NOT DETECTED NOT DETECTED Final   Escherichia coli DETECTED (A) NOT DETECTED Final    Comment:  CRITICAL RESULT CALLED TO, READ BACK BY AND VERIFIED WITH: JASON ROBBINS @ 0528 12/17/22 LFD    Klebsiella aerogenes NOT DETECTED NOT DETECTED Final   Klebsiella oxytoca NOT DETECTED NOT DETECTED Final   Klebsiella pneumoniae NOT DETECTED NOT DETECTED Final   Proteus species NOT DETECTED NOT DETECTED Final   Salmonella species NOT DETECTED NOT DETECTED Final   Serratia marcescens NOT DETECTED NOT DETECTED Final   Haemophilus influenzae NOT DETECTED NOT DETECTED Final   Neisseria meningitidis NOT DETECTED NOT DETECTED Final   Pseudomonas aeruginosa NOT DETECTED NOT DETECTED Final   Stenotrophomonas maltophilia NOT DETECTED NOT DETECTED Final   Candida albicans NOT DETECTED NOT DETECTED Final   Candida auris NOT DETECTED NOT DETECTED Final   Candida glabrata NOT DETECTED NOT DETECTED Final   Candida krusei NOT DETECTED NOT DETECTED Final   Candida parapsilosis NOT DETECTED NOT DETECTED Final   Candida tropicalis NOT DETECTED NOT DETECTED Final   Cryptococcus neoformans/gattii NOT DETECTED NOT DETECTED Final   CTX-M ESBL NOT DETECTED NOT DETECTED Final   Carbapenem resistance IMP NOT DETECTED NOT DETECTED Final   Carbapenem resistance KPC NOT DETECTED NOT DETECTED Final   Carbapenem resistance NDM NOT DETECTED NOT DETECTED Final   Carbapenem resist OXA 48 LIKE NOT DETECTED NOT DETECTED Final   Carbapenem resistance VIM NOT DETECTED NOT DETECTED Final    Comment: Performed at [id number], [address] [place], [place] [address]  Resp panel by RT-PCR (RSV, Flu A&B, Covid) Anterior Nasal Swab     Status: None   Collection Time: 12/15/22 10:07 PM   Specimen: Anterior Nasal Swab  Result Value Ref Range Status   SARS Coronavirus 2 by RT PCR NEGATIVE NEGATIVE Final    Comment: (NOTE) SARS-CoV-2 target nucleic acids are NOT  DETECTED.  The SARS-CoV-2 RNA is generally detectable in upper respiratory specimens during the acute phase of infection. The lowest concentration of SARS-CoV-2 viral copies this assay can detect is 138 copies/mL. A negative result does not preclude SARS-Cov-2 infection and should not be used as the sole basis for treatment or other patient management decisions. A negative result may occur with  improper specimen collection/handling, submission of specimen other than nasopharyngeal swab, presence of viral mutation(s) within the areas targeted by this assay, and inadequate number of viral copies(<138 copies/mL). A negative result must be combined with clinical observations, patient history, and epidemiological information. The expected result is Negative.  Fact Sheet for Patients:  BloggerCourse.com  Fact Sheet for Healthcare Providers:  SeriousBroker.it  This test is no t yet approved or cleared by the [place] FDA and  has been authorized for detection and/or diagnosis of SARS-CoV-2 by FDA under an Emergency Use Authorization (EUA). This EUA will remain  in effect (meaning this test can be used) for the duration of the COVID-19 declaration under Section 564(b)(1) of the Act, 21 U.S.C.section 360bbb-3(b)(1), unless the authorization is terminated  or revoked sooner.       Influenza A by PCR NEGATIVE NEGATIVE Final   Influenza B by PCR NEGATIVE NEGATIVE Final    Comment: (NOTE) The Xpert Xpress SARS-CoV-2/FLU/RSV plus assay is intended as an aid in the diagnosis of influenza from Nasopharyngeal swab specimens and should not be used as a sole basis for treatment. Nasal washings and aspirates are unacceptable for Xpert Xpress SARS-CoV-2/FLU/RSV testing.  Fact Sheet for Patients: BloggerCourse.com  Fact Sheet for Healthcare Providers: SeriousBroker.it  This test is not yet  approved or cleared by the [place]  FDA and has been authorized for detection and/or diagnosis of SARS-CoV-2 by FDA under an Emergency Use Authorization (EUA). This EUA will remain in effect (meaning this test can be used) for the duration of the COVID-19 declaration under Section 564(b)(1) of the Act, 21 U.S.C. section 360bbb-3(b)(1), unless the authorization is terminated or revoked.     Resp Syncytial Virus by PCR NEGATIVE NEGATIVE Final    Comment: (NOTE) Fact Sheet for Patients: BloggerCourse.com  Fact Sheet for Healthcare Providers: SeriousBroker.it  This test is not yet approved or cleared by the [place] FDA and has been authorized for detection and/or diagnosis of SARS-CoV-2 by FDA under an Emergency Use Authorization (EUA). This EUA will remain in effect (meaning this test can be used) for the duration of the COVID-19 declaration under Section 564(b)(1) of the Act, 21 U.S.C. section 360bbb-3(b)(1), unless the authorization is terminated or revoked.  Performed at [facility], [address] [place], [place] [address]   Urine Culture     Status: None   Collection Time: 12/16/22  4:12 AM   Specimen: In/Out Cath Urine  Result Value Ref Range Status   Specimen Description   Final    IN/OUT CATH URINE Performed at [facility], [address], [place], [place] [address]    Special Requests   Final    NONE Performed at [facility], [address], [place], [place] [address]    Culture   Final    NO GROWTH Performed at [facility], [address] [place], [place] [address]    Report Status 12/17/2022 FINAL  Final  Expectorated Sputum Assessment w Gram Stain, Rflx to Resp Cult     Status: None   Collection Time: 12/16/22  1:32 PM   Specimen: Sputum  Result Value Ref Range Status   Specimen Description SPUTUM  Final   Special Requests EXPSU  Final   Sputum evaluation    Final    THIS SPECIMEN IS ACCEPTABLE FOR SPUTUM CULTURE Performed at [facility], [address], [place], [place] [address]    Report Status 12/17/2022 FINAL  Final  Culture, Respiratory w Gram Stain     Status: None (Preliminary result)   Collection Time: 12/16/22  1:32 PM   Specimen: SPU  Result Value Ref Range Status   Specimen Description   Final    SPUTUM Performed at [facility], [address] [place], [place] [address]    Special Requests   Final    EXPSU Reflexed from [id number] Performed at [facility], [address] [place], [place] [address]    Gram Stain   Final    ABUNDANT SQUAMOUS EPITHELIAL CELLS PRESENT ABUNDANT WBC PRESENT,BOTH PMN AND MONONUCLEAR FEW GRAM POSITIVE COCCI IN PAIRS RARE GRAM POSITIVE RODS    Culture   Final    CULTURE REINCUBATED FOR BETTER GROWTH Performed at [facility], [address], [place], [place] [address]    Report Status PENDING  Incomplete  Respiratory (~20 pathogens) panel by PCR     Status: None   Collection Time: 12/17/22 12:12 AM   Specimen: Nasopharyngeal Swab; Respiratory  Result Value Ref Range Status   Adenovirus NOT DETECTED NOT DETECTED Final   Coronavirus 229E NOT DETECTED NOT DETECTED Final    Comment: (NOTE) The Coronavirus on the Respiratory Panel, DOES NOT test for the novel  Coronavirus (2019 nCoV)    Coronavirus HKU1 NOT DETECTED NOT DETECTED Final   Coronavirus NL63  NOT DETECTED NOT DETECTED Final   Coronavirus OC43 NOT DETECTED NOT DETECTED Final   Metapneumovirus NOT DETECTED NOT DETECTED Final   Rhinovirus / Enterovirus NOT DETECTED NOT DETECTED Final   Influenza A NOT DETECTED NOT DETECTED Final   Influenza B NOT DETECTED NOT DETECTED Final   Parainfluenza Virus 1 NOT DETECTED NOT DETECTED Final   Parainfluenza Virus 2 NOT DETECTED NOT DETECTED Final   Parainfluenza Virus 3 NOT DETECTED NOT DETECTED Final   Parainfluenza Virus 4 NOT DETECTED NOT DETECTED Final    Respiratory Syncytial Virus NOT DETECTED NOT DETECTED Final   Bordetella pertussis NOT DETECTED NOT DETECTED Final   Bordetella Parapertussis NOT DETECTED NOT DETECTED Final   Chlamydophila pneumoniae NOT DETECTED NOT DETECTED Final   Mycoplasma pneumoniae NOT DETECTED NOT DETECTED Final    Comment: Performed at [facility], [address], [place], [place] [address]         Radiology Studies: No results found.      Scheduled Meds:  budesonide (PULMICORT) nebulizer solution  0.5 mg Nebulization BID   cyanocobalamin  1,000 mcg Oral Daily   enoxaparin (LOVENOX) injection  0.5 mg/kg Subcutaneous Q24H   famotidine  20 mg Oral BID   guaiFENesin  600 mg Oral BID   insulin aspart  0-20 Units Subcutaneous TID WC   insulin aspart  0-5 Units Subcutaneous QHS   ipratropium-albuterol  3 mL Nebulization TID   iron polysaccharides  150 mg Oral Daily   methylPREDNISolone (SOLU-MEDROL) injection  80 mg Intravenous Q24H   metoprolol succinate  50 mg Oral Daily   pantoprazole  40 mg Oral Daily   rosuvastatin  20 mg Oral Daily   senna-docusate  1 tablet Oral BID   Continuous Infusions:  cefTRIAXone (ROCEPHIN)  IV       LOS: 4 days   Time spent= 35 mins    Carlyle Lipa, MD [facility]  If 7PM-7AM, please contact night-coverage  12/19/2022, 2:55 PM

## 2022-12-19 NOTE — Progress Notes (Signed)
Physical Therapy Treatment Patient Details Name: Thomas Tanner MRN: [id number] DOB: 1951-09-13 Today's Date: 12/19/2022   History of Present Illness Pt is a 72 year old male admitted with CAP, Acute on chronic hypercapnic respiratory failure, sepsis; PMH significant for COPD on home O2 at 3 L, diastolic CHF, , chronic anticoagulation with Eliquis due to history of saddle PE, HTN, DM, morbid obesity with BMI 50 to 59% and history of Fournier's gangrene and chronic back pain    PT Comments    Pt seen for PT tx with pt agreeable. Pt is able to complete bed mobility with mod I with reliance on hospital bed features, STS with min assist. Pt attempts to take 1-2 steps forward from EOB but pt with decreased strength & activity tolerance so returns to sitting EOB. When pt does attempt steps with RW pt with forward trunk flexion & leaning on RW with BUE elbows despite cuing for upright posture with pt reporting this is how he mobilizes at home. Pt engaged in standing x 2 more times EOB for BLE strengthening & activity tolerance with max standing time ~1 minute. Pt reports "I thought I was doing good but I really wasn't doing much at all" re: PLOF & CLOF. PT educated pt on importance of OOB mobility & encouraged pt to sit EOB throughout the day with pt voicing understanding but will likely need ongoing education/encouragement.     Recommendations for follow up therapy are one component of a multi-disciplinary discharge planning process, led by the attending physician.  Recommendations may be updated based on patient status, additional functional criteria and insurance authorization.  Follow Up Recommendations  Home health PT Can patient physically be transported by private vehicle: Yes   Assistance Recommended at Discharge Set up Supervision/Assistance  Patient can return home with the following Assistance with cooking/housework;Assist for transportation;Help with stairs or ramp for entrance;A  little help with walking and/or transfers;A little help with bathing/dressing/bathroom   Equipment Recommendations  None recommended by PT    Recommendations for Other Services       Precautions / Restrictions Precautions Precautions: Fall Restrictions Weight Bearing Restrictions: No     Mobility  Bed Mobility Overal bed mobility: Needs Assistance Bed Mobility: Supine to Sit, Sit to Supine     Supine to sit: Modified independent (Device/Increase time), HOB elevated Sit to supine: Modified independent (Device/Increase time), HOB elevated   General bed mobility comments: reliance on hospital bed features (HOB elevated, bed rails)    Transfers Overall transfer level: Needs assistance Equipment used: Rolling walker (2 wheels) Transfers: Sit to/from Stand Sit to Stand: Supervision           General transfer comment: STS from EOB x 3 times with RW    Ambulation/Gait                   Stairs             Wheelchair Mobility    Modified Rankin (Stroke Patients Only)       Balance Overall balance assessment: Needs assistance Sitting-balance support: No upper extremity supported, Feet supported Sitting balance-Leahy Scale: Good     Standing balance support: Reliant on assistive device for balance, During functional activity, Bilateral upper extremity supported Standing balance-Leahy Scale: Fair                              Cognition Arousal/Alertness: Awake/alert Behavior During Therapy: WFL for tasks  assessed/performed Overall Cognitive Status: Impaired/Different from baseline                           Safety/Judgement: Decreased awareness of safety     General Comments: Pt appears to be HOH at times but when PT repeats statement pt is able to follow commands without issue.        Exercises      General Comments General comments (skin integrity, edema, etc.): Pt on 3L/min via nasal cannula throughout session.       Pertinent Vitals/Pain Pain Assessment Pain Assessment: No/denies pain    Home Living                          Prior Function            PT Goals (current goals can now be found in the care plan section) Acute Rehab PT Goals Patient Stated Goal: to return home PT Goal Formulation: With patient Time For Goal Achievement: 12/30/22 Potential to Achieve Goals: Good Progress towards PT goals: Progressing toward goals    Frequency    Min 2X/week      PT Plan Current plan remains appropriate    Co-evaluation              AM-PAC PT "6 Clicks" Mobility   Outcome Measure  Help needed turning from your back to your side while in a flat bed without using bedrails?: A Little Help needed moving from lying on your back to sitting on the side of a flat bed without using bedrails?: A Little Help needed moving to and from a bed to a chair (including a wheelchair)?: A Little Help needed standing up from a chair using your arms (e.g., wheelchair or bedside chair)?: A Little Help needed to walk in hospital room?: A Little Help needed climbing 3-5 steps with a railing? : Total 6 Click Score: 16    End of Session Equipment Utilized During Treatment: Oxygen Activity Tolerance: Patient limited by fatigue;Patient tolerated treatment well Patient left: in bed;with call bell/phone within reach Nurse Communication: Mobility status PT Visit Diagnosis: Unsteadiness on feet (R26.81);Other abnormalities of gait and mobility (R26.89);Muscle weakness (generalized) (M62.81);Difficulty in walking, not elsewhere classified (R26.2)     Time: 6333-5456 PT Time Calculation (min) (ACUTE ONLY): 21 min  Charges:  $Therapeutic Activity: 8-22 mins                     Lavone Nian, PT, DPT 12/19/22, 9:42 AM  Waunita Schooner 12/19/2022, 9:40 AM

## 2022-12-19 NOTE — Plan of Care (Signed)

## 2022-12-20 DIAGNOSIS — J9622 Acute and chronic respiratory failure with hypercapnia: Secondary | ICD-10-CM | POA: Diagnosis not present

## 2022-12-20 DIAGNOSIS — I5032 Chronic diastolic (congestive) heart failure: Secondary | ICD-10-CM

## 2022-12-20 DIAGNOSIS — J439 Emphysema, unspecified: Secondary | ICD-10-CM | POA: Diagnosis not present

## 2022-12-20 DIAGNOSIS — J9621 Acute and chronic respiratory failure with hypoxia: Secondary | ICD-10-CM | POA: Diagnosis not present

## 2022-12-20 LAB — CBC WITH DIFFERENTIAL/PLATELET
Abs Immature Granulocytes: 0.04 10*3/uL (ref 0.00–0.07)
Basophils Absolute: 0 10*3/uL (ref 0.0–0.1)
Basophils Relative: 0 %
Eosinophils Absolute: 0 10*3/uL (ref 0.0–0.5)
Eosinophils Relative: 0 %
HCT: 35.1 % — ABNORMAL LOW (ref 39.0–52.0)
Hemoglobin: 11.2 g/dL — ABNORMAL LOW (ref 13.0–17.0)
Immature Granulocytes: 1 %
Lymphocytes Relative: 11 %
Lymphs Abs: 0.9 10*3/uL (ref 0.7–4.0)
MCH: 26.9 pg (ref 26.0–34.0)
MCHC: 31.9 g/dL (ref 30.0–36.0)
MCV: 84.2 fL (ref 80.0–100.0)
Monocytes Absolute: 0.3 10*3/uL (ref 0.1–1.0)
Monocytes Relative: 4 %
Neutro Abs: 6.4 10*3/uL (ref 1.7–7.7)
Neutrophils Relative %: 84 %
Platelets: 210 10*3/uL (ref 150–400)
RBC: 4.17 MIL/uL — ABNORMAL LOW (ref 4.22–5.81)
RDW: 20 % — ABNORMAL HIGH (ref 11.5–15.5)
WBC: 7.6 10*3/uL (ref 4.0–10.5)
nRBC: 0 % (ref 0.0–0.2)

## 2022-12-20 LAB — COMPREHENSIVE METABOLIC PANEL
ALT: 13 U/L (ref 0–44)
AST: 13 U/L — ABNORMAL LOW (ref 15–41)
Albumin: 3.2 g/dL — ABNORMAL LOW (ref 3.5–5.0)
Alkaline Phosphatase: 94 U/L (ref 38–126)
Anion gap: 8 (ref 5–15)
BUN: 22 mg/dL (ref 8–23)
CO2: 30 mmol/L (ref 22–32)
Calcium: 8.4 mg/dL — ABNORMAL LOW (ref 8.9–10.3)
Chloride: 96 mmol/L — ABNORMAL LOW (ref 98–111)
Creatinine, Ser: 0.92 mg/dL (ref 0.61–1.24)
GFR, Estimated: >60 mL/min (ref >60)
Glucose, Bld: 149 mg/dL — ABNORMAL HIGH (ref 70–99)
Potassium: 4.5 mmol/L (ref 3.5–5.1)
Sodium: 134 mmol/L — ABNORMAL LOW (ref 135–145)
Total Bilirubin: 0.6 mg/dL (ref 0.3–1.2)
Total Protein: 6.7 g/dL (ref 6.5–8.1)

## 2022-12-20 LAB — CULTURE, RESPIRATORY W GRAM STAIN: Culture: NORMAL

## 2022-12-20 LAB — GLUCOSE, CAPILLARY
Glucose-Capillary: 122 mg/dL — ABNORMAL HIGH (ref 70–99)
Glucose-Capillary: 147 mg/dL — ABNORMAL HIGH (ref 70–99)
Glucose-Capillary: 161 mg/dL — ABNORMAL HIGH (ref 70–99)

## 2022-12-20 MED ORDER — IPRATROPIUM-ALBUTEROL 0.5-2.5 (3) MG/3ML IN SOLN
3.0000 mL | Freq: Two times a day (BID) | RESPIRATORY_TRACT | Status: DC
  Administered 2022-12-20 – 2022-12-21 (×2): 3 mL via RESPIRATORY_TRACT
  Filled 2022-12-20 (×2): qty 3

## 2022-12-20 NOTE — Progress Notes (Signed)
PROGRESS NOTE    Thomas Tanner  [id number] DOB: 06-11-1951 DOA: 12/15/2022 PCP: Jonetta Osgood, NP   Brief Narrative:  72 year old with history of COPD on 3 L nasal cannula, diastolic CHF, saddle PE on chronic Eliquis, HTN, DM 2, morbid obesity, history of Fournier's gangrene, chronic back pain comes to the hospital with 2 weeks of cough congestion and shortness of breath which acutely worsened 2 days prior to hospital admission.  Upon admission he was noted to be febrile with mild leukocytosis, lactic acidosis.  Routine COVID/RSV and flu were negative chest x-ray showed interstitial edema with some atelectasis.  Patient was started on Rocephin, azithromycin, bronchodilators and steroids.  1/10.  Patient is on 3 L of oxygen.  Leukocytosis has resolved.  Repeat blood cultures ordered.  E. coli bacteremia is pansensitive.    Assessment & Plan:  Principal Problem:   CAP (community acquired pneumonia) Active Problems:   Acute on chronic respiratory failure with hypoxia and hypercapnia (HCC)   Sepsis (HCC)   Chronic diastolic CHF (congestive heart failure) (HCC)   COPD (chronic obstructive pulmonary disease) (HCC)   Chronic anticoagulation   History of saddle pulmonary embolus (PE)   Hypertension   Diabetes ([place])   Morbid obesity with BMI of 50.0-59.9, adult ([place])   History of Fournier's gangrene     Assessment and Plan: * CAP (community acquired pneumonia) Acute on chronic hypercapnic respiratory failure Sepsis, severe Sepsis physiology is improving.  COVID/RSV/flu was negative.  Procalcitonin mildly elevated.  Chest x-ray showing concerns of infiltrate/edema. - Rocephin/azithromycin, bronchodilators scheduled and as needed, I-S/flutter valve. - Patient is on 3 L of oxygen via nasal cannula  Bacteremia, E. coli - Blood cultures growing E. coli.  Already on Rocephin.  E. coli is pansensitive.  Repeat blood cultures ordered today.  Source for E. coli bacteremia not  clear as E. coli is not a very common bug for causing community-acquired pneumonia.    Acute on chronic diastolic CHF (congestive heart failure) ([place]) Compared to previous BNP this time this quite elevated.  Initially also received sepsis fluid in the ED. IV fluids stopped.  Completed IV Lasix will place the patient on Lasix 40 mg daily p.o. echo 2022 showed EF 60%.    Acute COPD exacerbation (chronic obstructive pulmonary disease) (HCC) Exacerbated by underlying pneumonia.  On steroids, bronchodilators  Mild hyperkalemia - Likely from steroid use - Getting Lasix  Chronic anticoagulation History of saddle PE He has been Off Eliquis since August 2023.   Hypertension Continue metoprolol.  IV as needed  Diabetes ([place]) Appears to diet controlled Sliding scale insulin coverage  Morbid obesity with BMI of 50.0-59.9, adult (HCC) Complicating factor to overall prognosis and care  History of Fournier's gangrene No acute issues suspected   DVT prophylaxis: Lovenox Code Status: Full  Family Communication:  Wife, daughter and son did not answer.   Status is: Inpatient  Cont hosp stay for resp distress   Subjective: Patient seen and examined bedside today.  States continues to have improvement with shortness of breath   examination: Constitutional: Not in acute distress; 7L HF Respiratory: diminished BS b/l Cardiovascular: Normal sinus rhythm, no rubs Abdomen: Nontender nondistended good bowel sounds Musculoskeletal: No edema noted Skin: No rashes seen Neurologic: CN 2-12 grossly intact.  And nonfocal Psychiatric: Normal judgment and insight. Alert and oriented x 3. Normal mood.     Objective: Vitals:   12/20/22 0347 12/20/22 0800 12/20/22 0802 12/20/22 1221  BP: 130/73 (!) 140/66  (!) 123/95  Pulse: 70 88  85  Resp: 18 20    Temp: 97.6 F (36.4 C) 98.3 F (36.8 C)  98 F (36.7 C)  TempSrc:  Oral  Oral  SpO2: 95% 92% 94% 93%  Weight:        Intake/Output  Summary (Last 24 hours) at 12/20/2022 1448 Last data filed at 12/20/2022 1341 Gross per 24 hour  Intake 460 ml  Output 3400 ml  Net -2940 ml   Filed Weights   12/15/22 2146  Weight: (!) 178 kg     Data Reviewed:   CBC: Recent Labs  Lab 12/15/22 2156 12/17/22 0405 12/18/22 0706 12/19/22 0508 12/20/22 0250  WBC 11.7* 11.3* 10.3 8.8 7.6  NEUTROABS 10.1*  --   --   --  6.4  HGB 10.7* 10.1* 10.8* 11.2* 11.2*  HCT 30.3* 29.7* 31.7* 30.8* 35.1*  MCV 89.9 87.6 84.5 85.8 84.2  PLT 205 219 259 226 093   Basic Metabolic Panel: Recent Labs  Lab 12/15/22 2155 12/15/22 2156 12/17/22 0405 12/18/22 0706 12/19/22 0508 12/19/22 0805 12/20/22 0250  NA  --  134* 138 137 136  --  134*  K  --  5.2* 4.1 3.5 5.4*  --  4.5  CL  --  96* 98 93* 95*  --  96*  CO2  --  29 33* 34* 33*  --  30  GLUCOSE  --  158* 143* 113* 124*  --  149*  BUN  --  18 22 19 23   --  22  CREATININE  --  1.14 0.93 1.09 1.16  --  0.92  CALCIUM  --  7.9* 8.3* 8.2* 8.5*  --  8.4*  MG 1.7  --   --   --   --  2.3  --    GFR: Estimated Creatinine Clearance: 125.9 mL/min (by C-G formula based on SCr of 0.92 mg/dL). Liver Function Tests: Recent Labs  Lab 12/15/22 2156 12/20/22 0250  AST 20 13*  ALT 10 13  ALKPHOS 103 94  BILITOT 1.2 0.6  PROT 6.2* 6.7  ALBUMIN 2.9* 3.2*   No results for input(s): "LIPASE", "AMYLASE" in the last 168 hours. No results for input(s): "AMMONIA" in the last 168 hours. Coagulation Profile: Recent Labs  Lab 12/15/22 2156  INR 1.4*   Cardiac Enzymes: No results for input(s): "CKTOTAL", "CKMB", "CKMBINDEX", "TROPONINI" in the last 168 hours. BNP (last 3 results) No results for input(s): "PROBNP" in the last 8760 hours. HbA1C: No results for input(s): "HGBA1C" in the last 72 hours. CBG: Recent Labs  Lab 12/19/22 1133 12/19/22 1617 12/19/22 2059 12/20/22 0748 12/20/22 1104  GLUCAP 123* 177* 154* 122* 147*   Lipid Profile: No results for input(s): "CHOL", "HDL",  "LDLCALC", "TRIG", "CHOLHDL", "LDLDIRECT" in the last 72 hours. Thyroid Function Tests: No results for input(s): "TSH", "T4TOTAL", "FREET4", "T3FREE", "THYROIDAB" in the last 72 hours. Anemia Panel: No results for input(s): "VITAMINB12", "FOLATE", "FERRITIN", "TIBC", "IRON", "RETICCTPCT" in the last 72 hours. Sepsis Labs: Recent Labs  Lab 12/15/22 2156 12/15/22 2325  PROCALCITON 0.29  --   LATICACIDVEN 2.3* 1.1    Recent Results (from the past 240 hour(s))  Blood Culture (routine x 2)     Status: Abnormal   Collection Time: 12/15/22  9:56 PM   Specimen: BLOOD LEFT ARM  Result Value Ref Range Status   Specimen Description   Final    BLOOD LEFT ARM Performed at [facility], [address] [place], [place] [address]  Special Requests   Final    BOTTLES DRAWN AEROBIC AND ANAEROBIC Blood Culture results may not be optimal due to an inadequate volume of blood received in culture bottles Performed at [facility], [address] [place], [place] [address]    Culture  Setup Time   Final    GRAM NEGATIVE RODS AEROBIC BOTTLE ONLY CRITICAL VALUE NOTED.  VALUE IS CONSISTENT WITH PREVIOUSLY REPORTED AND CALLED VALUE. Performed at [facility], [address] [place], [place] [address]    Culture (A)  Final    ESCHERICHIA COLI SUSCEPTIBILITIES PERFORMED ON PREVIOUS CULTURE WITHIN THE LAST 5 DAYS. Performed at [facility], [address], [place], [place] [address]    Report Status 12/19/2022 FINAL  Final  Blood Culture (routine x 2)     Status: Abnormal   Collection Time: 12/15/22  9:56 PM   Specimen: BLOOD RIGHT ARM  Result Value Ref Range Status   Specimen Description   Final    BLOOD RIGHT ARM Performed at [facility], [address], [place], [place] [address]    Special Requests   Final    BOTTLES DRAWN AEROBIC AND ANAEROBIC Blood Culture adequate volume Performed at [facility], [address] [place], [place]  [address]    Culture  Setup Time   Final    GRAM NEGATIVE RODS IN BOTH AEROBIC AND ANAEROBIC BOTTLES CRITICAL RESULT CALLED TO, READ BACK BY AND VERIFIED WITH: JASON ROBBINS @ 0528 12/17/22 LFD Performed at [facility], [address] [place], [place] [address]    Culture ESCHERICHIA COLI (A)  Final   Report Status 12/19/2022 FINAL  Final   Organism ID, Bacteria ESCHERICHIA COLI  Final      Susceptibility   Escherichia coli - MIC*    AMPICILLIN <=2 SENSITIVE Sensitive     CEFAZOLIN <=4 SENSITIVE Sensitive     CEFEPIME <=0.12 SENSITIVE Sensitive     CEFTAZIDIME <=1 SENSITIVE Sensitive     CEFTRIAXONE <=0.25 SENSITIVE Sensitive     CIPROFLOXACIN <=0.25 SENSITIVE Sensitive     GENTAMICIN <=1 SENSITIVE Sensitive     IMIPENEM <=0.25 SENSITIVE Sensitive     TRIMETH/SULFA <=20 SENSITIVE Sensitive     AMPICILLIN/SULBACTAM <=2 SENSITIVE Sensitive     PIP/TAZO <=4 SENSITIVE Sensitive     * ESCHERICHIA COLI  Blood Culture ID Panel (Reflexed)     Status: Abnormal   Collection Time: 12/15/22  9:56 PM  Result Value Ref Range Status   Enterococcus faecalis NOT DETECTED NOT DETECTED Final   Enterococcus Faecium NOT DETECTED NOT DETECTED Final   Listeria monocytogenes NOT DETECTED NOT DETECTED Final   Staphylococcus species NOT DETECTED NOT DETECTED Final   Staphylococcus aureus (BCID) NOT DETECTED NOT DETECTED Final   Staphylococcus epidermidis NOT DETECTED NOT DETECTED Final   Staphylococcus lugdunensis NOT DETECTED NOT DETECTED Final   Streptococcus species NOT DETECTED NOT DETECTED Final   Streptococcus agalactiae NOT DETECTED NOT DETECTED Final   Streptococcus pneumoniae NOT DETECTED NOT DETECTED Final   Streptococcus pyogenes NOT DETECTED NOT DETECTED Final   A.calcoaceticus-baumannii NOT DETECTED NOT DETECTED Final   Bacteroides fragilis NOT DETECTED NOT DETECTED Final   Enterobacterales DETECTED (A) NOT DETECTED Final    Comment: Enterobacterales represent a large order of  gram negative bacteria, not a single organism. CRITICAL RESULT CALLED TO, READ BACK BY AND VERIFIED WITH: JASON ROBBINS @ 0528 12/17/22 LFD    Enterobacter cloacae complex NOT DETECTED NOT DETECTED Final  Escherichia coli DETECTED (A) NOT DETECTED Final    Comment: CRITICAL RESULT CALLED TO, READ BACK BY AND VERIFIED WITH: JASON ROBBINS @ 5361 12/17/22 LFD    Klebsiella aerogenes NOT DETECTED NOT DETECTED Final   Klebsiella oxytoca NOT DETECTED NOT DETECTED Final   Klebsiella pneumoniae NOT DETECTED NOT DETECTED Final   Proteus species NOT DETECTED NOT DETECTED Final   Salmonella species NOT DETECTED NOT DETECTED Final   Serratia marcescens NOT DETECTED NOT DETECTED Final   Haemophilus influenzae NOT DETECTED NOT DETECTED Final   Neisseria meningitidis NOT DETECTED NOT DETECTED Final   Pseudomonas aeruginosa NOT DETECTED NOT DETECTED Final   Stenotrophomonas maltophilia NOT DETECTED NOT DETECTED Final   Candida albicans NOT DETECTED NOT DETECTED Final   Candida auris NOT DETECTED NOT DETECTED Final   Candida glabrata NOT DETECTED NOT DETECTED Final   Candida krusei NOT DETECTED NOT DETECTED Final   Candida parapsilosis NOT DETECTED NOT DETECTED Final   Candida tropicalis NOT DETECTED NOT DETECTED Final   Cryptococcus neoformans/gattii NOT DETECTED NOT DETECTED Final   CTX-M ESBL NOT DETECTED NOT DETECTED Final   Carbapenem resistance IMP NOT DETECTED NOT DETECTED Final   Carbapenem resistance KPC NOT DETECTED NOT DETECTED Final   Carbapenem resistance NDM NOT DETECTED NOT DETECTED Final   Carbapenem resist OXA 48 LIKE NOT DETECTED NOT DETECTED Final   Carbapenem resistance VIM NOT DETECTED NOT DETECTED Final    Comment: Performed at [facility], [address] [place], [place] [address]  Resp panel by RT-PCR (RSV, Flu A&B, Covid) Anterior Nasal Swab     Status: None   Collection Time: 12/15/22 10:07 PM   Specimen: Anterior Nasal Swab  Result Value Ref Range Status    SARS Coronavirus 2 by RT PCR NEGATIVE NEGATIVE Final    Comment: (NOTE) SARS-CoV-2 target nucleic acids are NOT DETECTED.  The SARS-CoV-2 RNA is generally detectable in upper respiratory specimens during the acute phase of infection. The lowest concentration of SARS-CoV-2 viral copies this assay can detect is 138 copies/mL. A negative result does not preclude SARS-Cov-2 infection and should not be used as the sole basis for treatment or other patient management decisions. A negative result may occur with  improper specimen collection/handling, submission of specimen other than nasopharyngeal swab, presence of viral mutation(s) within the areas targeted by this assay, and inadequate number of viral copies(<138 copies/mL). A negative result must be combined with clinical observations, patient history, and epidemiological information. The expected result is Negative.  Fact Sheet for Patients:  EntrepreneurPulse.com.au  Fact Sheet for Healthcare Providers:  IncredibleEmployment.be  This test is no t yet approved or cleared by the [place] FDA and  has been authorized for detection and/or diagnosis of SARS-CoV-2 by FDA under an Emergency Use Authorization (EUA). This EUA will remain  in effect (meaning this test can be used) for the duration of the COVID-19 declaration under Section 564(b)(1) of the Act, 21 U.S.C.section 360bbb-3(b)(1), unless the authorization is terminated  or revoked sooner.       Influenza A by PCR NEGATIVE NEGATIVE Final   Influenza B by PCR NEGATIVE NEGATIVE Final    Comment: (NOTE) The Xpert Xpress SARS-CoV-2/FLU/RSV plus assay is intended as an aid in the diagnosis of influenza from Nasopharyngeal swab specimens and should not be used as a sole basis for treatment. Nasal washings and aspirates are unacceptable for Xpert Xpress SARS-CoV-2/FLU/RSV testing.  Fact Sheet for  Patients: EntrepreneurPulse.com.au  Fact Sheet for Healthcare Providers: IncredibleEmployment.be  This  test is not yet approved or cleared by the [place] and has been authorized for detection and/or diagnosis of SARS-CoV-2 by FDA under an Emergency Use Authorization (EUA). This EUA will remain in effect (meaning this test can be used) for the duration of the COVID-19 declaration under Section 564(b)(1) of the Act, 21 U.S.C. section 360bbb-3(b)(1), unless the authorization is terminated or revoked.     Resp Syncytial Virus by PCR NEGATIVE NEGATIVE Final    Comment: (NOTE) Fact Sheet for Patients: EntrepreneurPulse.com.au  Fact Sheet for Healthcare Providers: IncredibleEmployment.be  This test is not yet approved or cleared by the [place] FDA and has been authorized for detection and/or diagnosis of SARS-CoV-2 by FDA under an Emergency Use Authorization (EUA). This EUA will remain in effect (meaning this test can be used) for the duration of the COVID-19 declaration under Section 564(b)(1) of the Act, 21 U.S.C. section 360bbb-3(b)(1), unless the authorization is terminated or revoked.  Performed at [facility], [address] [place], [place] [address]   Urine Culture     Status: None   Collection Time: 12/16/22  4:12 AM   Specimen: In/Out Cath Urine  Result Value Ref Range Status   Specimen Description   Final    IN/OUT CATH URINE Performed at [facility], [address], [place], [place] [address]    Special Requests   Final    NONE Performed at [facility], [address], [place], [place] [address]    Culture   Final    NO GROWTH Performed at [facility], [place] [address], [place], [place] [address]    Report Status 12/17/2022 FINAL  Final  Expectorated Sputum Assessment w Gram Stain, Rflx to Resp Cult     Status: None   Collection Time:  12/16/22  1:32 PM   Specimen: Sputum  Result Value Ref Range Status   Specimen Description SPUTUM  Final   Special Requests EXPSU  Final   Sputum evaluation   Final    THIS SPECIMEN IS ACCEPTABLE FOR SPUTUM CULTURE Performed at [facility], [address] [place], [place] [address]    Report Status 12/17/2022 FINAL  Final  Culture, Respiratory w Gram Stain     Status: None   Collection Time: 12/16/22  1:32 PM   Specimen: SPU  Result Value Ref Range Status   Specimen Description   Final    SPUTUM Performed at [facility], [address], [place], [place] [address]    Special Requests   Final    EXPSU Reflexed from [phone] Performed at [facility], [address] [place], [place] [address]    Gram Stain   Final    ABUNDANT SQUAMOUS EPITHELIAL CELLS PRESENT ABUNDANT WBC PRESENT,BOTH PMN AND MONONUCLEAR FEW GRAM POSITIVE COCCI IN PAIRS RARE GRAM POSITIVE RODS    Culture   Final    FEW Normal respiratory flora-no Staph aureus or Pseudomonas seen Performed at [facility], [place] [address] [place], [place] [address]    Report Status 12/20/2022 FINAL  Final  Respiratory (~20 pathogens) panel by PCR     Status: None   Collection Time: 12/17/22 12:12 AM   Specimen: Nasopharyngeal Swab; Respiratory  Result Value Ref Range Status   Adenovirus NOT DETECTED NOT DETECTED Final   Coronavirus 229E NOT DETECTED NOT DETECTED Final    Comment: (NOTE) The Coronavirus on the Respiratory Panel, DOES NOT test for the novel  Coronavirus (2019 nCoV)  Coronavirus HKU1 NOT DETECTED NOT DETECTED Final   Coronavirus NL63 NOT DETECTED NOT DETECTED Final   Coronavirus OC43 NOT DETECTED NOT DETECTED Final   Metapneumovirus NOT DETECTED NOT DETECTED Final   Rhinovirus / Enterovirus NOT DETECTED NOT DETECTED Final   Influenza A NOT DETECTED NOT DETECTED Final   Influenza B NOT DETECTED NOT DETECTED Final   Parainfluenza Virus 1 NOT DETECTED NOT DETECTED Final    Parainfluenza Virus 2 NOT DETECTED NOT DETECTED Final   Parainfluenza Virus 3 NOT DETECTED NOT DETECTED Final   Parainfluenza Virus 4 NOT DETECTED NOT DETECTED Final   Respiratory Syncytial Virus NOT DETECTED NOT DETECTED Final   Bordetella pertussis NOT DETECTED NOT DETECTED Final   Bordetella Parapertussis NOT DETECTED NOT DETECTED Final   Chlamydophila pneumoniae NOT DETECTED NOT DETECTED Final   Mycoplasma pneumoniae NOT DETECTED NOT DETECTED Final    Comment: Performed at [facility], [place] [address], [place], [place] [address]  Culture, blood (Routine X 2) w Reflex to ID Panel     Status: None (Preliminary result)   Collection Time: 12/19/22  3:54 PM   Specimen: BLOOD  Result Value Ref Range Status   Specimen Description BLOOD BLOOD RIGHT HAND  Final   Special Requests   Final    BOTTLES DRAWN AEROBIC ONLY Blood Culture adequate volume   Culture   Final    NO GROWTH < 12 HOURS Performed at [facility], [address] [place], [place] [address]    Report Status PENDING  Incomplete  Culture, blood (Routine X 2) w Reflex to ID Panel     Status: None (Preliminary result)   Collection Time: 12/19/22  3:58 PM   Specimen: BLOOD  Result Value Ref Range Status   Specimen Description BLOOD BLOOD RIGHT FOREARM  Final   Special Requests   Final    BOTTLES DRAWN AEROBIC AND ANAEROBIC Blood Culture adequate volume   Culture   Final    NO GROWTH < 12 HOURS Performed at [facility], [address], [place], [place] [address]    Report Status PENDING  Incomplete         Radiology Studies: No results found.      Scheduled Meds:  budesonide (PULMICORT) nebulizer solution  0.5 mg Nebulization BID   cyanocobalamin  1,000 mcg Oral Daily   enoxaparin (LOVENOX) injection  0.5 mg/kg Subcutaneous Q24H   famotidine  20 mg Oral BID   furosemide  40 mg Oral Daily   guaiFENesin  600 mg Oral BID   insulin aspart  0-20 Units Subcutaneous TID WC   insulin  aspart  0-5 Units Subcutaneous QHS   ipratropium-albuterol  3 mL Nebulization BID   iron polysaccharides  150 mg Oral Daily   methylPREDNISolone (SOLU-MEDROL) injection  80 mg Intravenous Q24H   metoprolol succinate  50 mg Oral Daily   pantoprazole  40 mg Oral Daily   rosuvastatin  20 mg Oral Daily   senna-docusate  1 tablet Oral BID   Continuous Infusions:  cefTRIAXone (ROCEPHIN)  IV Stopped (12/19/22 2206)     LOS: 5 days   Time spent= 35 mins    Joshlyn Beadle, MD [facility]  If 7PM-7AM, please contact night-coverage  12/20/2022, 2:48 PM

## 2022-12-20 NOTE — TOC Progression Note (Signed)
Transition of Care [facility]) - Progression Note    Patient Details  Name: Thomas Tanner MRN: [id number] Date of Birth: [birth year]  Transition of [facility]) CM/SW Contact  Laurena Slimmer, RN Phone Number: 12/20/2022, 4:33 PM  Clinical Narrative:    Talk to patient at bedside. He plans to have his brother pick him up tomorrow but may need EMS if he's unable to reach him.    Expected Discharge Plan: [place] Barriers to Discharge: Continued Medical Work up  Expected Discharge Plan and Services     Post Acute Care Choice: [place] arrangements for the past 2 months: [place] Determinants of Health (SDOH) Interventions SDOH Screenings   Food Insecurity: No Food Insecurity (12/17/2022)  Housing: Low Risk  (12/17/2022)  Transportation Needs: No Transportation Needs (12/17/2022)  Utilities: Not At Risk (12/17/2022)  Alcohol Screen: Low Risk  (10/02/2021)  Depression (PHQ2-9): Low Risk  (10/02/2021)  Physical Activity: Unknown (12/08/2018)  Social Connections: Moderately Integrated (12/08/2018)  Stress: No Stress Concern Present (12/08/2018)  Tobacco Use: High Risk (12/17/2022)    Readmission Risk Interventions     No data to display

## 2022-12-20 NOTE — Care Management Important Message (Signed)
Important Message  Patient Details  Name: Thomas Tanner MRN: [id number] Date of Birth: 06/20/1951   Medicare Important Message Given:  Yes     Dannette Barbara 12/20/2022, 1:43 PM

## 2022-12-20 NOTE — Progress Notes (Signed)
Physical Therapy Treatment Patient Details Name: Thomas Tanner MRN: [id number] DOB: Feb 13, 1951 Today's Date: 12/20/2022   History of Present Illness Pt is a 72 year old male admitted with CAP, Acute on chronic hypercapnic respiratory failure, sepsis; PMH significant for COPD on home O2 at 3 L, diastolic CHF, , chronic anticoagulation with Eliquis due to history of saddle PE, HTN, DM, morbid obesity with BMI 50 to 59% and history of Fournier's gangrene and chronic back pain    PT Comments    PT seen for PT tx with pt agreeable to session. Provided pt with bariatric recliner. Pt is able to complete bed mobility with mod I but with definite reliance on HOB fully elevated & bed rails. Pt is able to complete STS & step pivot with RW. Pt agreeable to taking a few steps & is able to ambulate ~3 ft forwards & backwards with RW & supervision. Pt declines further mobility 2/2 BLE weakness despite being aware of & PT educating him on purpose of repetition.     Recommendations for follow up therapy are one component of a multi-disciplinary discharge planning process, led by the attending physician.  Recommendations may be updated based on patient status, additional functional criteria and insurance authorization.  Follow Up Recommendations  Home health PT Can patient physically be transported by private vehicle: Yes   Assistance Recommended at Discharge Set up Supervision/Assistance  Patient can return home with the following Assistance with cooking/housework;Assist for transportation;Help with stairs or ramp for entrance;A little help with walking and/or transfers;A little help with bathing/dressing/bathroom   Equipment Recommendations  None recommended by PT    Recommendations for Other Services       Precautions / Restrictions Precautions Precautions: Fall Restrictions Weight Bearing Restrictions: No     Mobility  Bed Mobility Overal bed mobility: Needs Assistance Bed Mobility:  Supine to Sit     Supine to sit: Modified independent (Device/Increase time), HOB elevated     General bed mobility comments: reliance on bed rails & HOB elevated    Transfers Overall transfer level: Needs assistance Equipment used: Rolling walker (2 wheels) Transfers: Sit to/from Stand Sit to Stand: Supervision   Step pivot transfers: Supervision       General transfer comment: STS from [place] with RW & supervision, step pivot bed>recliner with RW    Ambulation/Gait Ambulation/Gait assistance: Supervision Gait Distance (Feet):  (3 ft forwards + backwards) Assistive device: Rolling walker (2 wheels) (bariatric RW) Gait Pattern/deviations: Decreased step length - right, Decreased step length - left, Decreased dorsiflexion - right, Decreased dorsiflexion - left, Decreased stride length Gait velocity: decreased         Stairs             Wheelchair Mobility    Modified Rankin (Stroke Patients Only)       Balance Overall balance assessment: Needs assistance Sitting-balance support: No upper extremity supported, Feet supported Sitting balance-Leahy Scale: Good     Standing balance support: Reliant on assistive device for balance, During functional activity, Bilateral upper extremity supported Standing balance-Leahy Scale: Fair                              Cognition Arousal/Alertness: Awake/alert Behavior During Therapy: WFL for tasks assessed/performed   Area of Impairment: Safety/judgement                       Following Commands: Follows one  step commands consistently Safety/Judgement: Decreased awareness of safety Awareness: Emergent            Exercises      General Comments General comments (skin integrity, edema, etc.): Pt on 3L/min via nasal cannula throughout session.      Pertinent Vitals/Pain Pain Assessment Pain Assessment: No/denies pain    Home Living                          Prior  Function            PT Goals (current goals can now be found in the care plan section) Acute Rehab PT Goals Patient Stated Goal: to return home PT Goal Formulation: With patient Time For Goal Achievement: 12/30/22 Potential to Achieve Goals: Good Progress towards PT goals: Progressing toward goals    Frequency    Min 2X/week      PT Plan Current plan remains appropriate    Co-evaluation              AM-PAC PT "6 Clicks" Mobility   Outcome Measure  Help needed turning from your back to your side while in a flat bed without using bedrails?: A Little Help needed moving from lying on your back to sitting on the side of a flat bed without using bedrails?: A Little Help needed moving to and from a bed to a chair (including a wheelchair)?: A Little Help needed standing up from a chair using your arms (e.g., wheelchair or bedside chair)?: A Little Help needed to walk in hospital room?: A Little Help needed climbing 3-5 steps with a railing? : Total 6 Click Score: 16    End of Session Equipment Utilized During Treatment: Oxygen Activity Tolerance: Patient limited by fatigue Patient left: in chair;with call bell/phone within reach   PT Visit Diagnosis: Unsteadiness on feet (R26.81);Other abnormalities of gait and mobility (R26.89);Muscle weakness (generalized) (M62.81);Difficulty in walking, not elsewhere classified (R26.2)     Time: 5621-3086 PT Time Calculation (min) (ACUTE ONLY): 11 min  Charges:  $Therapeutic Activity: 8-22 mins                     Lavone Nian, PT, DPT 12/20/22, 12:08 PM   Waunita Schooner 12/20/2022, 12:02 PM

## 2022-12-21 DIAGNOSIS — J189 Pneumonia, unspecified organism: Secondary | ICD-10-CM | POA: Diagnosis not present

## 2022-12-21 LAB — GLUCOSE, CAPILLARY
Glucose-Capillary: 109 mg/dL — ABNORMAL HIGH (ref 70–99)
Glucose-Capillary: 156 mg/dL — ABNORMAL HIGH (ref 70–99)
Glucose-Capillary: 190 mg/dL — ABNORMAL HIGH (ref 70–99)

## 2022-12-21 LAB — BASIC METABOLIC PANEL
Anion gap: 10 (ref 5–15)
BUN: 23 mg/dL (ref 8–23)
CO2: 28 mmol/L (ref 22–32)
Calcium: 8.2 mg/dL — ABNORMAL LOW (ref 8.9–10.3)
Chloride: 94 mmol/L — ABNORMAL LOW (ref 98–111)
Creatinine, Ser: 0.99 mg/dL (ref 0.61–1.24)
GFR, Estimated: >60 mL/min (ref >60)
Glucose, Bld: 131 mg/dL — ABNORMAL HIGH (ref 70–99)
Potassium: 4.9 mmol/L (ref 3.5–5.1)
Sodium: 132 mmol/L — ABNORMAL LOW (ref 135–145)

## 2022-12-21 LAB — CBC
HCT: 30.7 % — ABNORMAL LOW (ref 39.0–52.0)
Hemoglobin: 11.3 g/dL — ABNORMAL LOW (ref 13.0–17.0)
MCH: 32.1 pg (ref 26.0–34.0)
MCHC: 36.8 g/dL — ABNORMAL HIGH (ref 30.0–36.0)
MCV: 87.2 fL (ref 80.0–100.0)
Platelets: 207 10*3/uL (ref 150–400)
RBC: 3.52 MIL/uL — ABNORMAL LOW (ref 4.22–5.81)
RDW: 22.3 % — ABNORMAL HIGH (ref 11.5–15.5)
WBC: 9.5 10*3/uL (ref 4.0–10.5)
nRBC: 0 % (ref 0.0–0.2)

## 2022-12-21 MED ORDER — FUROSEMIDE 40 MG PO TABS: 40.0000 mg | ORAL_TABLET | Freq: Every day | ORAL | 11 refills | Status: DC

## 2022-12-21 MED ORDER — PREDNISONE 20 MG PO TABS: 20.0000 mg | ORAL_TABLET | Freq: Two times a day (BID) | ORAL | 0 refills | Status: AC

## 2022-12-21 MED ORDER — LEVOFLOXACIN 750 MG PO TABS: 750.0000 mg | ORAL_TABLET | Freq: Every day | ORAL | 0 refills | Status: AC

## 2022-12-21 NOTE — Discharge Summary (Signed)
Physician Discharge Summary  Thomas Tanner TDD:[id number] DOB: 1951/08/04 DOA: 12/15/2022  PCP: Sallyanne Kuster, NP  Admit date: 12/15/2022 Discharge date: 12/21/2022  Time spent: 35 minutes  Recommendations for Outpatient Follow-up:  Follow-up with the PCP in 1 week    Discharge Diagnoses:  Principal Problem:   CAP (community acquired pneumonia) Active Problems:   Acute on chronic respiratory failure with hypoxia and hypercapnia (HCC)   Sepsis (HCC)   Chronic diastolic CHF (congestive heart failure) (HCC)   COPD (chronic obstructive pulmonary disease) (HCC)   Chronic anticoagulation   History of saddle pulmonary embolus (PE)   Hypertension   Diabetes (HCC)   Morbid obesity with BMI of 50.0-59.9, adult (HCC)   History of Fournier's gangrene   Discharge Condition: Stable  Diet recommendation: Cardiac, diabetic  Filed Weights   12/15/22 2146  Weight: (!) 178 kg    History of present illness and hospital course 72 year old with history of COPD on 3 L nasal cannula, diastolic CHF, saddle PE on chronic Eliquis, HTN, DM 2, morbid obesity, history of Fournier's gangrene, chronic back pain comes to the hospital with 2 weeks of cough congestion and shortness of breath which acutely worsened 2 days prior to hospital admission. Upon admission he was noted to be febrile with mild leukocytosis, lactic acidosis. Routine COVID/RSV and flu were negative chest x-ray showed interstitial edema with some atelectasis. Patient was started on Rocephin, azithromycin, bronchodilators and steroids.   * CAP (community acquired pneumonia) Acute on chronic hypercapnic respiratory failure Sepsis, severe Sepsis physiology is improving.  COVID/RSV/flu was negative.  Procalcitonin mildly elevated.  Chest x-ray showing concerns of infiltrate/edema. - Rocephin/azithromycin, bronchodilators scheduled and as needed, I-S/flutter valve. - Patient is on 3 L of oxygen via nasal cannula   Bacteremia, E.  coli - Blood cultures growing E. coli.  Already on Rocephin.  E. coli is pansensitive.  Repeat blood cultures ordered today.  Source for E. coli bacteremia not clear as E. coli is not a very common bug for causing community-acquired pneumonia.     Acute on chronic diastolic CHF (congestive heart failure) (HCC) Compared to previous BNP this time this quite elevated.  Initially also received sepsis fluid in the ED. IV fluids stopped.  Completed IV Lasix will place the patient on Lasix 40 mg daily p.o. echo 2022 showed EF 60%.     Acute COPD exacerbation (chronic obstructive pulmonary disease) (HCC) Exacerbated by underlying pneumonia.  On steroids, bronchodilators   Mild hyperkalemia - Likely from steroid use - Getting Lasix   Chronic anticoagulation History of saddle PE He has been Off Eliquis since August 2023.    Hypertension Continue metoprolol.  IV as needed   Diabetes (HCC) Appears to diet controlled Sliding scale insulin coverage   Morbid obesity with BMI of 50.0-59.9, adult (HCC) Complicating factor to overall prognosis and care   History of Fournier's gangrene No acute issues suspected     Discharge Exam: Vitals:   12/21/22 1118 12/21/22 1549  BP: 132/75 131/79  Pulse: 67 69  Resp: 19 18  Temp: 97.8 F (36.6 C) 98.4 F (36.9 C)  SpO2: 92% 93%   examination: Constitutional: Not in acute distress; 7L HF Respiratory: diminished BS b/l Cardiovascular: Normal sinus rhythm, no rubs Abdomen: Nontender nondistended good bowel sounds Musculoskeletal: No edema noted Skin: No rashes seen Neurologic: CN 2-12 grossly intact.  And nonfocal Psychiatric: Normal judgment and insight. Alert and oriented x 3. Normal mood.     Discharge Instructions   Discharge  Instructions     Diet - low sodium heart healthy   Complete by: As directed    Increase activity slowly   Complete by: As directed       Allergies as of 12/21/2022       Reactions   Morphine Anxiety,  Other (See Comments)   Hallucinations Pt states 6.17.22 that he is not allergic to this medication   Azithromycin Itching   Localized pruritus on arm with IV   Morphine And Related Anxiety        Medication List     TAKE these medications    albuterol 108 (90 Base) MCG/ACT inhaler Commonly known as: Ventolin HFA Inhale 2 puffs into the lungs every 6 (six) hours as needed for wheezing or shortness of breath.   ALPRAZolam 0.25 MG tablet Commonly known as: XANAX Take 1 tablet (0.25 mg total) by mouth daily as needed (severe anxiety). May take 1 additional dose after 2 hours if the first dose is not effective.   cyanocobalamin 1000 MCG tablet Take 1 tablet (1,000 mcg total) by mouth daily.   famotidine 20 MG tablet Commonly known as: PEPCID Take 1 tablet (20 mg total) by mouth 2 (two) times daily.   furosemide 40 MG tablet Commonly known as: Lasix Take 1 tablet (40 mg total) by mouth daily.   iron polysaccharides 150 MG capsule Commonly known as: NIFEREX Take 1 capsule (150 mg total) by mouth daily.   lansoprazole 30 MG capsule Commonly known as: PREVACID Take 1 capsule (30 mg total) by mouth daily before breakfast.   levofloxacin 750 MG tablet Commonly known as: Levaquin Take 1 tablet (750 mg total) by mouth daily for 7 days.   meloxicam 7.5 MG tablet Commonly known as: MOBIC Take 1 tablet (7.5 mg total) by mouth 2 (two) times daily.   metoprolol succinate 50 MG 24 hr tablet Commonly known as: TOPROL-XL Take 1 tablet (50 mg total) by mouth daily. Take with or immediately following a meal.   OXYGEN Inhale 2.5 L into the lungs. 24 HRS CONTINUOUS OXYGEN USES [employer] [facility]   phenylephrine-shark liver oil-mineral oil-petrolatum 0.25-14-74.9 % rectal ointment Commonly known as: PREPARATION H Place 1 application rectally 2 (two) times daily as needed for hemorrhoids.   predniSONE 20 MG tablet Commonly known as: DELTASONE Take 1 tablet (20 mg total) by  mouth 2 (two) times daily with a meal for 7 days.   rosuvastatin 20 MG tablet Commonly known as: CRESTOR Take 1 tablet (20 mg total) by mouth daily.   senna-docusate 8.6-50 MG tablet Commonly known as: Senokot-S Take 1 tablet by mouth 2 (two) times daily.       Allergies  Allergen Reactions   Morphine Anxiety and Other (See Comments)    Hallucinations  Pt states 6.17.22 that he is not allergic to this medication   Azithromycin Itching    Localized pruritus on arm with IV   Morphine And Related Anxiety    Follow-up Information     [facility] Follow up.   Specialty: Home Health Services Why: They will follow up with you for your home health needs. Contact information: [address] [place] [address] [phone]                  The results of significant diagnostics from this hospitalization (including imaging, microbiology, ancillary and laboratory) are listed below for reference.    Significant Diagnostic Studies: DG Chest [facility] View  Result  Date: 12/15/2022 CLINICAL DATA:  Difficulty breathing. EXAM: PORTABLE CHEST 1 VIEW COMPARISON:  May 19, 2021 FINDINGS: There is stable mild to moderate severity enlargement of the cardiac silhouette. Diffusely increased interstitial lung markings are seen with mild areas of atelectasis and/or infiltrate noted within the bilateral lung bases. There is no evidence of a pleural effusion or pneumothorax. The visualized skeletal structures are unremarkable. IMPRESSION: 1. Cardiomegaly with mild interstitial edema. 2. Mild bibasilar atelectasis and/or infiltrate. Electronically Signed   By: Virgina Norfolk M.D.   On: 12/15/2022 22:35    Microbiology: Recent Results (from the past 240 hour(s))  Blood Culture (routine x 2)     Status: Abnormal   Collection Time: 12/15/22  9:56 PM   Specimen: BLOOD LEFT ARM  Result Value Ref Range Status   Specimen Description   Final    BLOOD LEFT ARM Performed at [facility], [address] [place], [place] [address]    Special Requests   Final    BOTTLES DRAWN AEROBIC AND ANAEROBIC Blood Culture results may not be optimal due to an inadequate volume of blood received in culture bottles Performed at [facility], [address] [place], [place] [address]    Culture  Setup Time   Final    GRAM NEGATIVE RODS AEROBIC BOTTLE ONLY CRITICAL VALUE NOTED.  VALUE IS CONSISTENT WITH PREVIOUSLY REPORTED AND CALLED VALUE. Performed at [facility], [address] [place], [place] [address]    Culture (A)  Final    ESCHERICHIA COLI SUSCEPTIBILITIES PERFORMED ON PREVIOUS CULTURE WITHIN THE LAST 5 DAYS. Performed at [facility], [place] [address] [place], [place] [address]    Report Status 12/19/2022 FINAL  Final  Blood Culture (routine x 2)     Status: Abnormal   Collection Time: 12/15/22  9:56 PM   Specimen: BLOOD RIGHT ARM  Result Value Ref Range Status   Specimen Description   Final    BLOOD RIGHT ARM Performed at [facility], [place] [address] [place], [place] [address]    Special Requests   Final    BOTTLES DRAWN AEROBIC AND ANAEROBIC Blood Culture adequate volume Performed at [facility], [address] [place], [place] [address]    Culture  Setup Time   Final    GRAM NEGATIVE RODS IN BOTH AEROBIC AND ANAEROBIC BOTTLES CRITICAL RESULT CALLED TO, READ BACK BY AND VERIFIED WITH: JASON ROBBINS @ 0528 12/17/22 LFD Performed at [facility], [place], [place] [address]    Culture ESCHERICHIA COLI (A)  Final   Report Status 12/19/2022 FINAL  Final   Organism ID, Bacteria ESCHERICHIA COLI  Final      Susceptibility   Escherichia coli - MIC*    AMPICILLIN <=2 SENSITIVE Sensitive     CEFAZOLIN <=4 SENSITIVE Sensitive     CEFEPIME <=0.12 SENSITIVE Sensitive     CEFTAZIDIME <=1 SENSITIVE Sensitive     CEFTRIAXONE <=0.25 SENSITIVE Sensitive     CIPROFLOXACIN <=0.25 SENSITIVE  Sensitive     GENTAMICIN <=1 SENSITIVE Sensitive     IMIPENEM <=0.25 SENSITIVE Sensitive     TRIMETH/SULFA <=20 SENSITIVE Sensitive     AMPICILLIN/SULBACTAM <=2 SENSITIVE Sensitive     PIP/TAZO <=4 SENSITIVE Sensitive     * ESCHERICHIA COLI  Blood Culture ID Panel (Reflexed)     Status: Abnormal   Collection Time: 12/15/22  9:56 PM  Result Value Ref Range Status   Enterococcus faecalis  NOT DETECTED NOT DETECTED Final   Enterococcus Faecium NOT DETECTED NOT DETECTED Final   Listeria monocytogenes NOT DETECTED NOT DETECTED Final   Staphylococcus species NOT DETECTED NOT DETECTED Final   Staphylococcus aureus (BCID) NOT DETECTED NOT DETECTED Final   Staphylococcus epidermidis NOT DETECTED NOT DETECTED Final   Staphylococcus lugdunensis NOT DETECTED NOT DETECTED Final   Streptococcus species NOT DETECTED NOT DETECTED Final   Streptococcus agalactiae NOT DETECTED NOT DETECTED Final   Streptococcus pneumoniae NOT DETECTED NOT DETECTED Final   Streptococcus pyogenes NOT DETECTED NOT DETECTED Final   A.calcoaceticus-baumannii NOT DETECTED NOT DETECTED Final   Bacteroides fragilis NOT DETECTED NOT DETECTED Final   Enterobacterales DETECTED (A) NOT DETECTED Final    Comment: Enterobacterales represent a large order of gram negative bacteria, not a single organism. CRITICAL RESULT CALLED TO, READ BACK BY AND VERIFIED WITH: JASON ROBBINS @ 0528 12/17/22 LFD    Enterobacter cloacae complex NOT DETECTED NOT DETECTED Final   Escherichia coli DETECTED (A) NOT DETECTED Final    Comment: CRITICAL RESULT CALLED TO, READ BACK BY AND VERIFIED WITH: JASON ROBBINS @ 0528 12/17/22 LFD    Klebsiella aerogenes NOT DETECTED NOT DETECTED Final   Klebsiella oxytoca NOT DETECTED NOT DETECTED Final   Klebsiella pneumoniae NOT DETECTED NOT DETECTED Final   Proteus species NOT DETECTED NOT DETECTED Final   Salmonella species NOT DETECTED NOT DETECTED Final   Serratia marcescens NOT DETECTED NOT DETECTED Final    Haemophilus influenzae NOT DETECTED NOT DETECTED Final   Neisseria meningitidis NOT DETECTED NOT DETECTED Final   Pseudomonas aeruginosa NOT DETECTED NOT DETECTED Final   Stenotrophomonas maltophilia NOT DETECTED NOT DETECTED Final   Candida albicans NOT DETECTED NOT DETECTED Final   Candida auris NOT DETECTED NOT DETECTED Final   Candida glabrata NOT DETECTED NOT DETECTED Final   Candida krusei NOT DETECTED NOT DETECTED Final   Candida parapsilosis NOT DETECTED NOT DETECTED Final   Candida tropicalis NOT DETECTED NOT DETECTED Final   Cryptococcus neoformans/gattii NOT DETECTED NOT DETECTED Final   CTX-M ESBL NOT DETECTED NOT DETECTED Final   Carbapenem resistance IMP NOT DETECTED NOT DETECTED Final   Carbapenem resistance KPC NOT DETECTED NOT DETECTED Final   Carbapenem resistance NDM NOT DETECTED NOT DETECTED Final   Carbapenem resist OXA 48 LIKE NOT DETECTED NOT DETECTED Final   Carbapenem resistance VIM NOT DETECTED NOT DETECTED Final    Comment: Performed at [facility], [address] [place], [place] [address]  Resp panel by RT-PCR (RSV, Flu A&B, Covid) Anterior Nasal Swab     Status: None   Collection Time: 12/15/22 10:07 PM   Specimen: Anterior Nasal Swab  Result Value Ref Range Status   SARS Coronavirus 2 by RT PCR NEGATIVE NEGATIVE Final    Comment: (NOTE) SARS-CoV-2 target nucleic acids are NOT DETECTED.  The SARS-CoV-2 RNA is generally detectable in upper respiratory specimens during the acute phase of infection. The lowest concentration of SARS-CoV-2 viral copies this assay can detect is 138 copies/mL. A negative result does not preclude SARS-Cov-2 infection and should not be used as the sole basis for treatment or other patient management decisions. A negative result may occur with  improper specimen collection/handling, submission of specimen other than nasopharyngeal swab, presence of viral mutation(s) within the areas targeted by this assay, and  inadequate number of viral copies(<138 copies/mL). A negative result must be combined with clinical observations, patient history, and epidemiological information. The expected result is Negative.  Fact Sheet for  Patients:  BloggerCourse.comhttps://www.fda.gov/media/152166/download  Fact Sheet for Healthcare Providers:  SeriousBroker.ithttps://www.fda.gov/media/152162/download  This test is no t yet approved or cleared by the [place] FDA and  has been authorized for detection and/or diagnosis of SARS-CoV-2 by FDA under an Emergency Use Authorization (EUA). This EUA will remain  in effect (meaning this test can be used) for the duration of the COVID-19 declaration under Section 564(b)(1) of the Act, 21 U.S.C.section 360bbb-3(b)(1), unless the authorization is terminated  or revoked sooner.       Influenza A by PCR NEGATIVE NEGATIVE Final   Influenza B by PCR NEGATIVE NEGATIVE Final    Comment: (NOTE) The Xpert Xpress SARS-CoV-2/FLU/RSV plus assay is intended as an aid in the diagnosis of influenza from Nasopharyngeal swab specimens and should not be used as a sole basis for treatment. Nasal washings and aspirates are unacceptable for Xpert Xpress SARS-CoV-2/FLU/RSV testing.  Fact Sheet for Patients: BloggerCourse.comhttps://www.fda.gov/media/152166/download  Fact Sheet for Healthcare Providers: SeriousBroker.ithttps://www.fda.gov/media/152162/download  This test is not yet approved or cleared by the [place] FDA and has been authorized for detection and/or diagnosis of SARS-CoV-2 by FDA under an Emergency Use Authorization (EUA). This EUA will remain in effect (meaning this test can be used) for the duration of the COVID-19 declaration under Section 564(b)(1) of the Act, 21 U.S.C. section 360bbb-3(b)(1), unless the authorization is terminated or revoked.     Resp Syncytial Virus by PCR NEGATIVE NEGATIVE Final    Comment: (NOTE) Fact Sheet for Patients: BloggerCourse.comhttps://www.fda.gov/media/152166/download  Fact Sheet for Healthcare  Providers: SeriousBroker.ithttps://www.fda.gov/media/152162/download  This test is not yet approved or cleared by the [place] FDA and has been authorized for detection and/or diagnosis of SARS-CoV-2 by FDA under an Emergency Use Authorization (EUA). This EUA will remain in effect (meaning this test can be used) for the duration of the COVID-19 declaration under Section 564(b)(1) of the Act, 21 U.S.C. section 360bbb-3(b)(1), unless the authorization is terminated or revoked.  Performed at [facility], [address] [place], [place] [address]   Urine Culture     Status: None   Collection Time: 12/16/22  4:12 AM   Specimen: In/Out Cath Urine  Result Value Ref Range Status   Specimen Description   Final    IN/OUT CATH URINE Performed at [facility], [address] [place], [place] [address]    Special Requests   Final    NONE Performed at [facility], [address] [place], [place] [address]    Culture   Final    NO GROWTH Performed at [facility], [address] [place], [place] [address]    Report Status 12/17/2022 FINAL  Final  Expectorated Sputum Assessment w Gram Stain, Rflx to Resp Cult     Status: None   Collection Time: 12/16/22  1:32 PM   Specimen: Sputum  Result Value Ref Range Status   Specimen Description SPUTUM  Final   Special Requests EXPSU  Final   Sputum evaluation   Final    THIS SPECIMEN IS ACCEPTABLE FOR SPUTUM CULTURE Performed at [facility], [address] [place], [place] [address]    Report Status 12/17/2022 FINAL  Final  Culture, Respiratory w Gram Stain     Status: None   Collection Time: 12/16/22  1:32 PM   Specimen: SPU  Result Value Ref Range Status   Specimen Description   Final    SPUTUM Performed at [facility], [address] [place], [place] [address]  Special Requests   Final    EXPSU Reflexed from [phone] Performed at [facility], [address]  [place], [place] [address]    Gram Stain   Final    ABUNDANT SQUAMOUS EPITHELIAL CELLS PRESENT ABUNDANT WBC PRESENT,BOTH PMN AND MONONUCLEAR FEW GRAM POSITIVE COCCI IN PAIRS RARE GRAM POSITIVE RODS    Culture   Final    FEW Normal respiratory flora-no Staph aureus or Pseudomonas seen Performed at [facility], [address], [place], [place] [address]    Report Status 12/20/2022 FINAL  Final  Respiratory (~20 pathogens) panel by PCR     Status: None   Collection Time: 12/17/22 12:12 AM   Specimen: Nasopharyngeal Swab; Respiratory  Result Value Ref Range Status   Adenovirus NOT DETECTED NOT DETECTED Final   Coronavirus 229E NOT DETECTED NOT DETECTED Final    Comment: (NOTE) The Coronavirus on the Respiratory Panel, DOES NOT test for the novel  Coronavirus (2019 nCoV)    Coronavirus HKU1 NOT DETECTED NOT DETECTED Final   Coronavirus NL63 NOT DETECTED NOT DETECTED Final   Coronavirus OC43 NOT DETECTED NOT DETECTED Final   Metapneumovirus NOT DETECTED NOT DETECTED Final   Rhinovirus / Enterovirus NOT DETECTED NOT DETECTED Final   Influenza A NOT DETECTED NOT DETECTED Final   Influenza B NOT DETECTED NOT DETECTED Final   Parainfluenza Virus 1 NOT DETECTED NOT DETECTED Final   Parainfluenza Virus 2 NOT DETECTED NOT DETECTED Final   Parainfluenza Virus 3 NOT DETECTED NOT DETECTED Final   Parainfluenza Virus 4 NOT DETECTED NOT DETECTED Final   Respiratory Syncytial Virus NOT DETECTED NOT DETECTED Final   Bordetella pertussis NOT DETECTED NOT DETECTED Final   Bordetella Parapertussis NOT DETECTED NOT DETECTED Final   Chlamydophila pneumoniae NOT DETECTED NOT DETECTED Final   Mycoplasma pneumoniae NOT DETECTED NOT DETECTED Final    Comment: Performed at [facility], [address], [place], [place] [address]  Culture, blood (Routine X 2) w Reflex to ID Panel     Status: None (Preliminary result)   Collection Time: 12/19/22  3:54 PM   Specimen: BLOOD  Result Value Ref Range  Status   Specimen Description BLOOD BLOOD RIGHT HAND  Final   Special Requests   Final    BOTTLES DRAWN AEROBIC ONLY Blood Culture adequate volume   Culture   Final    NO GROWTH 2 DAYS Performed at [facility], [address] [place], [place] [address]    Report Status PENDING  Incomplete  Culture, blood (Routine X 2) w Reflex to ID Panel     Status: None (Preliminary result)   Collection Time: 12/19/22  3:58 PM   Specimen: BLOOD  Result Value Ref Range Status   Specimen Description BLOOD BLOOD RIGHT FOREARM  Final   Special Requests   Final    BOTTLES DRAWN AEROBIC AND ANAEROBIC Blood Culture adequate volume   Culture   Final    NO GROWTH 2 DAYS Performed at [facility], [address], [place], [place] [address]    Report Status PENDING  Incomplete     Labs: Basic Metabolic Panel: Recent Labs  Lab 12/15/22 2155 12/15/22 2156 12/17/22 0405 12/18/22 0706 12/19/22 0508 12/19/22 0805 12/20/22 0250 12/21/22 0434  NA  --    < > 138 137 136  --  134* 132*  K  --    < > 4.1 3.5 5.4*  --  4.5 4.9  CL  --    < >  98 93* 95*  --  96* 94*  CO2  --    < > 33* 34* 33*  --  30 28  GLUCOSE  --    < > 143* 113* 124*  --  149* 131*  BUN  --    < > 22 19 23   --  22 23  CREATININE  --    < > 0.93 1.09 1.16  --  0.92 0.99  CALCIUM  --    < > 8.3* 8.2* 8.5*  --  8.4* 8.2*  MG 1.7  --   --   --   --  2.3  --   --    < > = values in this interval not displayed.   Liver Function Tests: Recent Labs  Lab 12/15/22 2156 12/20/22 0250  AST 20 13*  ALT 10 13  ALKPHOS 103 94  BILITOT 1.2 0.6  PROT 6.2* 6.7  ALBUMIN 2.9* 3.2*   No results for input(s): "LIPASE", "AMYLASE" in the last 168 hours. No results for input(s): "AMMONIA" in the last 168 hours. CBC: Recent Labs  Lab 12/15/22 2156 12/17/22 0405 12/18/22 0706 12/19/22 0508 12/20/22 0250 12/21/22 0434  WBC 11.7* 11.3* 10.3 8.8 7.6 9.5  NEUTROABS 10.1*  --   --   --  6.4  --   HGB 10.7* 10.1* 10.8*  11.2* 11.2* 11.3*  HCT 30.3* 29.7* 31.7* 30.8* 35.1* 30.7*  MCV 89.9 87.6 84.5 85.8 84.2 87.2  PLT 205 219 259 226 210 207   Cardiac Enzymes: No results for input(s): "CKTOTAL", "CKMB", "CKMBINDEX", "TROPONINI" in the last 168 hours. BNP: BNP (last 3 results) Recent Labs    12/15/22 2156 12/17/22 0405  BNP 207.9* 156.0*    ProBNP (last 3 results) No results for input(s): "PROBNP" in the last 8760 hours.  CBG: Recent Labs  Lab 12/20/22 0748 12/20/22 1104 12/20/22 1611 12/21/22 0727 12/21/22 1119  GLUCAP 122* 147* 161* 109* 156*       Signed:  Anjalina Bergevin MD.  [facility] 12/21/2022, 4:00 PM      Labs: Basic Metabolic Panel: Recent Labs  Lab 12/15/22 2155 12/15/22 2156 12/17/22 0405 12/18/22 0706 12/19/22 0508 12/19/22 0805 12/20/22 0250 12/21/22 0434  NA  --    < > 138 137 136  --  134* 132*  K  --    < > 4.1 3.5 5.4*  --  4.5 4.9  CL  --    < > 98 93* 95*  --  96* 94*  CO2  --    < > 33* 34* 33*  --  30 28  GLUCOSE  --    < > 143* 113* 124*  --  149* 131*  BUN  --    < > 22 19 23   --  22 23  CREATININE  --    < > 0.93 1.09 1.16  --  0.92 0.99  CALCIUM  --    < > 8.3* 8.2* 8.5*  --  8.4* 8.2*  MG 1.7  --   --   --   --  2.3  --   --    < > = values in this interval not displayed.   Liver Function Tests: Recent Labs  Lab 12/15/22 2156 12/20/22 0250  AST 20 13*  ALT 10 13  ALKPHOS 103 94  BILITOT 1.2 0.6  PROT 6.2* 6.7  ALBUMIN 2.9* 3.2*   No results for input(s): "LIPASE", "AMYLASE" in the last 168 hours. No  results for input(s): "AMMONIA" in the last 168 hours. CBC: Recent Labs  Lab 12/15/22 2156 12/17/22 0405 12/18/22 0706 12/19/22 0508 12/20/22 0250 12/21/22 0434  WBC 11.7* 11.3* 10.3 8.8 7.6 9.5  NEUTROABS 10.1*  --   --   --  6.4  --   HGB 10.7* 10.1* 10.8* 11.2* 11.2* 11.3*  HCT 30.3* 29.7* 31.7* 30.8* 35.1* 30.7*  MCV 89.9 87.6 84.5 85.8 84.2 87.2  PLT 205 219 259 226 210 207   Cardiac Enzymes: No results  for input(s): "CKTOTAL", "CKMB", "CKMBINDEX", "TROPONINI" in the last 168 hours. BNP: BNP (last 3 results) Recent Labs    12/15/22 2156 12/17/22 0405  BNP 207.9* 156.0*    ProBNP (last 3 results) No results for input(s): "PROBNP" in the last 8760 hours.  CBG: Recent Labs  Lab 12/20/22 0748 12/20/22 1104 12/20/22 1611 12/21/22 0727 12/21/22 1119  GLUCAP 122* 147* 161* 109* 156*       Signed:  Charlestine Rookstool MD.  [facility] 12/21/2022, 4:00 PM  12/21/2022

## 2022-12-21 NOTE — TOC Transition Note (Signed)
Transition of Care [facility]) - CM/SW Discharge Note   Patient Details  Name: Thomas Tanner MRN: [id number] Date of Birth: Feb 02, 1951  Transition of Care [facility] CM/SW Contact:  Laurena Slimmer, RN Phone Number: 12/21/2022, 3:49 PM   Clinical Narrative:    Discharge order received.  Patient EMS arranged  Spoke with patint's wife [name] @ [phone] to advise of discharge home.  Attempt to reach patient's son, no answer. Unable to leave a message.  Spoke with patient to advised of discharge.   TOC signing off.      Final next level of care: [place] Barriers to Discharge: Barriers Resolved   Patient Goals and CMS Choice      Discharge Placement                    Name of family member notified: Message left for daughter, Marcelle Overlie Patient and family notified of of transfer: 12/21/22  Discharge Plan and Services Additional resources added to the After Visit Summary for       Post Acute Care Choice: Home Health                    HH Arranged: PT, OT          Social Determinants of Health (SDOH) Interventions SDOH Screenings   Food Insecurity: No Food Insecurity (12/17/2022)  Housing: Low Risk  (12/17/2022)  Transportation Needs: No Transportation Needs (12/17/2022)  Utilities: Not At Risk (12/17/2022)  Alcohol Screen: Low Risk  (10/02/2021)  Depression (PHQ2-9): Low Risk  (10/02/2021)  Physical Activity: Unknown (12/08/2018)  Social Connections: Moderately Integrated (12/08/2018)  Stress: No Stress Concern Present (12/08/2018)  Tobacco Use: High Risk (12/17/2022)     Readmission Risk Interventions     No data to display

## 2022-12-21 NOTE — Progress Notes (Signed)
Occupational Therapy Treatment Patient Details Name: Thomas Tanner MRN: [id number] DOB: 30-Jan-1951 Today's Date: 12/21/2022   History of present illness Pt is a 72 year old male admitted with CAP, Acute on chronic hypercapnic respiratory failure, sepsis; PMH significant for COPD on home O2 at 3 L, diastolic CHF, , chronic anticoagulation with Eliquis due to history of saddle PE, HTN, DM, morbid obesity with BMI 50 to 59% and history of Fournier's gangrene and chronic back pain   OT comments  Pt requires encouragement to participate, initially stating he is too tired but finally agrees to limited activity. He is able to transition supine<>sit with increased time and effort and heavy use of bed rails. but without any physical assistance. Pt comes into standing with SUPV and RW, bed elevated, able to maintain standing balance for <1 minute before returning to sitting, x 2. Pt able to lift feet in sitting and thread through opening in shorts, requires Mod A for raising shorts to waist level. Pt left in bed, alarm activated, call bell within reach.   Recommendations for follow up therapy are one component of a multi-disciplinary discharge planning process, led by the attending physician.  Recommendations may be updated based on patient status, additional functional criteria and insurance authorization.    Follow Up Recommendations  Home health OT     Assistance Recommended at Discharge Frequent or constant Supervision/Assistance  Patient can return home with the following  A lot of help with walking and/or transfers;A lot of help with bathing/dressing/bathroom;Help with stairs or ramp for entrance   Equipment Recommendations       Recommendations for Other Services      Precautions / Restrictions Precautions Precautions: Fall Restrictions Weight Bearing Restrictions: No       Mobility Bed Mobility Overal bed mobility: Needs Assistance Bed Mobility: Supine to Sit, Sit to  Supine     Supine to sit: Modified independent (Device/Increase time), HOB elevated Sit to supine: Modified independent (Device/Increase time), HOB elevated   General bed mobility comments: reliance on bed rails & HOB elevated; increased time and efforts    Transfers Overall transfer level: Needs assistance Equipment used: Rolling walker (2 wheels) Transfers: Sit to/from Stand Sit to Stand: Supervision                 Balance Overall balance assessment: Needs assistance Sitting-balance support: No upper extremity supported, Feet supported Sitting balance-Leahy Scale: Good     Standing balance support: Reliant on assistive device for balance, Bilateral upper extremity supported Standing balance-Leahy Scale: Fair Standing balance comment: Heavy UE support                           ADL either performed or assessed with clinical judgement   ADL Overall ADL's : Needs assistance/impaired                     Lower Body Dressing: Moderate assistance Lower Body Dressing Details (indicate cue type and reason): donning shorts                    Extremity/Trunk Assessment Upper Extremity Assessment Upper Extremity Assessment: Generalized weakness   Lower Extremity Assessment Lower Extremity Assessment: Generalized weakness        Vision       Perception     Praxis      Cognition Arousal/Alertness: Awake/alert Behavior During Therapy: WFL for tasks assessed/performed Overall Cognitive Status: No family/caregiver present  to determine baseline cognitive functioning                                          Exercises      Shoulder Instructions       General Comments Pt on 3L O2 throughout session    Pertinent Vitals/ Pain       Pain Assessment Pain Assessment: No/denies pain  Home Living                                          Prior Functioning/Environment              Frequency  Min  2X/week        Progress Toward Goals  OT Goals(current goals can now be found in the care plan section)  Progress towards OT goals: Progressing toward goals  Acute Rehab OT Goals OT Goal Formulation: With patient Time For Goal Achievement: 12/30/22 Potential to Achieve Goals: Good  Plan Frequency remains appropriate;Discharge plan needs to be updated    Co-evaluation                 AM-PAC OT "6 Clicks" Daily Activity     Outcome Measure   Help from another person eating meals?: None Help from another person taking care of personal grooming?: A Little Help from another person toileting, which includes using toliet, bedpan, or urinal?: A Lot Help from another person bathing (including washing, rinsing, drying)?: A Lot Help from another person to put on and taking off regular upper body clothing?: A Little Help from another person to put on and taking off regular lower body clothing?: A Lot 6 Click Score: 16    End of Session Equipment Utilized During Treatment: Rolling walker (2 wheels);Oxygen  OT Visit Diagnosis: Unsteadiness on feet (R26.81);Muscle weakness (generalized) (M62.81)   Activity Tolerance Patient tolerated treatment well   Patient Left in bed;with call bell/phone within reach;with bed alarm set   Nurse Communication          Time: 2119-4174 OT Time Calculation (min): 8 min  Charges: OT General Charges $OT Visit: 1 Visit OT Treatments $Self Care/Home Management : 8-22 mins Josiah Lobo, PhD, MS, OTR/L 12/21/22, 3:05 PM

## 2022-12-24 LAB — CULTURE, BLOOD (ROUTINE X 2)
Culture: NO GROWTH
Culture: NO GROWTH
Special Requests: ADEQUATE
Special Requests: ADEQUATE

## 2022-12-25 ENCOUNTER — Encounter: Payer: Self-pay | Admitting: Nurse Practitioner

## 2022-12-25 ENCOUNTER — Telehealth: Payer: Medicare Other | Admitting: Nurse Practitioner

## 2022-12-25 VITALS — Resp 16 | Ht 73.0 in

## 2022-12-25 DIAGNOSIS — B9689 Other specified bacterial agents as the cause of diseases classified elsewhere: Secondary | ICD-10-CM | POA: Diagnosis not present

## 2022-12-25 DIAGNOSIS — H109 Unspecified conjunctivitis: Secondary | ICD-10-CM | POA: Diagnosis not present

## 2022-12-25 DIAGNOSIS — J189 Pneumonia, unspecified organism: Secondary | ICD-10-CM

## 2022-12-25 DIAGNOSIS — Z09 Encounter for follow-up examination after completed treatment for conditions other than malignant neoplasm: Secondary | ICD-10-CM

## 2022-12-25 DIAGNOSIS — L299 Pruritus, unspecified: Secondary | ICD-10-CM | POA: Diagnosis not present

## 2022-12-25 DIAGNOSIS — J9611 Chronic respiratory failure with hypoxia: Secondary | ICD-10-CM

## 2022-12-25 DIAGNOSIS — F419 Anxiety disorder, unspecified: Secondary | ICD-10-CM

## 2022-12-25 MED ORDER — OFLOXACIN 0.3 % OP SOLN: 1.0000 [drp] | Freq: Four times a day (QID) | OPHTHALMIC | 0 refills | Status: AC

## 2022-12-25 MED ORDER — HYDROXYZINE HCL 25 MG PO TABS: 25.0000 mg | ORAL_TABLET | Freq: Three times a day (TID) | ORAL | 2 refills | Status: DC | PRN

## 2022-12-25 MED ORDER — ALPRAZOLAM 0.25 MG PO TABS: 0.2500 mg | ORAL_TABLET | Freq: Every day | ORAL | 0 refills | Status: DC | PRN

## 2022-12-25 NOTE — Progress Notes (Signed)
[facility] [address] [place], [address] [place] [address] [facility] Discharge Acute Issues Care Follow Up                                                                        Patient Demographics  Thomas Tanner, is a 72 y.o. male  DOB 10-23-1951  MRN [id number].  Primary MD  Jonetta [place], NP   I connected with  Thomas Tanner on 12/25/22 by a video enabled telemedicine application and verified that I am speaking with the correct person using two identifiers.   I discussed the limitations of evaluation and management by telemedicine. The patient expressed understanding and agreed to proceed.   Reason for TCC follow Up - community acquired pneumonia.    Past Medical History:  Diagnosis Date   Arthritis    CHF (congestive heart failure) (HCC)    COPD (chronic obstructive pulmonary disease) (HCC)    Diabetes ([place])    Hyperlipidemia    Hypertension    Kidney stone    Obesity    Tachycardia    TIA (transient ischemic attack)     Past Surgical History:  Procedure Laterality Date   CORONARY/GRAFT ACUTE MI REVASCULARIZATION N/A 12/08/2018   Procedure: Coronary/Graft Acute MI Revascularization;  Surgeon: Yolonda Kida, MD;  Location: [facility];  Service: Cardiovascular;  Laterality: N/A;   INCISION AND DRAINAGE ABSCESS N/A 05/18/2021   Procedure: INCISION AND DRAINAGE SCROTAL ABSCESS, cystoscopy with urethral dilation, and complex catheter placement;  Surgeon: Billey Co, MD;  Location: [facility];  Service: Urology;  Laterality: N/A;   IRRIGATION AND DEBRIDEMENT ABSCESS N/A 05/20/2021   Procedure: IRRIGATION AND DEBRIDEMENT ABSCESS;  Surgeon: Abbie Sons, MD;  Location: [facility];  Service: Urology;  Laterality: N/A;   IRRIGATION AND DEBRIDEMENT ABSCESS N/A 05/21/2021   Procedure: IRRIGATION AND DEBRIDEMENT ABSCESS;  Surgeon:  Abbie Sons, MD;  Location: [facility];  Service: Urology;  Laterality: N/A;   KIDNEY STONE SURGERY     left arm surgery  1963   LEFT HEART CATH AND CORONARY ANGIOGRAPHY N/A 12/08/2018   Procedure: LEFT HEART CATH AND CORONARY ANGIOGRAPHY;  Surgeon: Yolonda Kida, MD;  Location: [facility];  Service: Cardiovascular;  Laterality: N/A;   PULMONARY THROMBECTOMY N/A 08/03/2020   Procedure: PULMONARY THROMBECTOMY / THROMBOLYSIS;  Surgeon: Algernon Huxley, MD;  Location: [facility];  Service: Cardiovascular;  Laterality: N/A;   RECTAL EXAM UNDER ANESTHESIA N/A 05/20/2021   Procedure: EXAM UNDER ANESTHESIA-Dressing and Change;  Surgeon: Abbie Sons, MD;  Location: [facility];  Service: Urology;  Laterality: N/A;       Recent HPI and Hospital Course  History of present illness and hospital course 72 year old with history of COPD on 3 L nasal cannula, diastolic CHF, saddle PE on chronic Eliquis, HTN, DM 2, morbid obesity, history of Fournier's gangrene, chronic back pain comes to the hospital with 2 weeks of cough congestion and shortness of  breath which acutely worsened 2 days prior to hospital admission. Upon admission he was noted to be febrile with mild leukocytosis, lactic acidosis. Routine COVID/RSV and flu were negative chest x-ray showed interstitial edema with some atelectasis. Patient was started on Rocephin, azithromycin, bronchodilators and steroids.    * CAP (community acquired pneumonia) Acute on chronic hypercapnic respiratory failure Sepsis, severe Sepsis physiology is improving.  COVID/RSV/flu was negative.  Procalcitonin mildly elevated.  Chest x-ray showing concerns of infiltrate/edema. - Rocephin/azithromycin, bronchodilators scheduled and as needed, I-S/flutter valve. - Patient is on 3 L of oxygen via nasal cannula   Bacteremia, E. coli - Blood cultures growing E. coli.  Already on Rocephin.  E. coli is pansensitive.  Repeat blood cultures ordered  today.  Source for E. coli bacteremia not clear as E. coli is not a very common bug for causing community-acquired pneumonia.     Acute on chronic diastolic CHF (congestive heart failure) (HCC) Compared to previous BNP this time this quite elevated.  Initially also received sepsis fluid in the ED. IV fluids stopped.  Completed IV Lasix will place the patient on Lasix 40 mg daily p.o. echo 2022 showed EF 60%.     Acute COPD exacerbation (chronic obstructive pulmonary disease) (HCC) Exacerbated by underlying pneumonia.  On steroids, bronchodilators   Mild hyperkalemia - Likely from steroid use - Getting Lasix   Chronic anticoagulation History of saddle PE He has been Off Eliquis since August 2023.    Hypertension Continue metoprolol.  IV as needed   Diabetes (HCC) Appears to diet controlled Sliding scale insulin coverage   Morbid obesity with BMI of 50.0-59.9, adult (HCC) Complicating factor to overall prognosis and care   History of Fournier's gangrene No acute issues suspected    Post Hospital Acute Care Issue to be followed in the Clinic  CAP (community acquired pneumonia) Active Problems:   Acute on chronic respiratory failure with hypoxia and hypercapnia (HCC)   Sepsis (HCC)   Chronic diastolic CHF (congestive heart failure) (HCC)   COPD (chronic obstructive pulmonary disease) (HCC)   Chronic anticoagulation   History of saddle pulmonary embolus (PE)   Hypertension   Diabetes (HCC)   Morbid obesity with BMI of 50.0-59.9, adult (HCC)   History of Fournier's gangrene    Subjective:   Thomas Tanner today has, No headache, No chest pain, No abdominal pain - No Nausea, No new weakness tingling or numbness, No Cough - SOB.   Assessment & Plan   1. Hospital discharge follow-up Follow up with patient, refills done. Breathing is improved.   2. Community acquired pneumonia, unspecified laterality Treated with IV antibiotics in hospital.   3. Chronic respiratory  failure with hypoxia (HCC) Breathing better, back to baseline per patient.   4. Bacterial conjunctivitis of both eyes Tx prescribed for conjunctivitis -- ofloxacin for 5 days, take as prescribed.  - ofloxacin (OCUFLOX) 0.3 % ophthalmic solution; Place 1 drop into both eyes 4 (four) times daily for 5 days.  Dispense: 5 mL; Refill: 0  5. Pruritus May take hydroxyzine prn for itching - hydrOXYzine (ATARAX) 25 MG tablet; Take 1 tablet (25 mg total) by mouth 3 (three) times daily as needed for itching.  Dispense: 90 tablet; Refill: 2  6. Anxiety Stable, continue alprazolam as needed as prescribed.  - ALPRAZolam (XANAX) 0.25 MG tablet; Take 1 tablet (0.25 mg total) by mouth daily as needed (severe anxiety). May take 1 additional dose after 2 hours if the first dose is not effective.  Dispense: 30 tablet; Refill: 0     Reason for frequent admissions/ER visits   Chronic respiratory failure with hypoxia.  Wounds Asthma COPD hypertension       Objective:   Vitals:   12/25/22 1042  Resp: 16  Height: 6\' 1"  (1.854 m)    Wt Readings from Last 3 Encounters:  12/15/22 (!) 392 lb 6.7 oz (178 kg)  04/13/22 (!) 416 lb (188.7 kg)  12/14/21 (!) 415 lb (188.2 kg)    Allergies as of 12/25/2022       Reactions   Morphine Anxiety, Other (See Comments)   Hallucinations Pt states 6.17.22 that he is not allergic to this medication   Azithromycin Itching   Localized pruritus on arm with IV   Morphine And Related Anxiety        Medication List        Accurate as of December 25, 2022 10:59 AM. If you have any questions, ask your nurse or doctor.          albuterol 108 (90 Base) MCG/ACT inhaler Commonly known as: Ventolin HFA Inhale 2 puffs into the lungs every 6 (six) hours as needed for wheezing or shortness of breath.   ALPRAZolam 0.25 MG tablet Commonly known as: XANAX Take 1 tablet (0.25 mg total) by mouth daily as needed (severe anxiety). May take 1 additional dose after 2  hours if the first dose is not effective.   cyanocobalamin 1000 MCG tablet Take 1 tablet (1,000 mcg total) by mouth daily.   famotidine 20 MG tablet Commonly known as: PEPCID Take 1 tablet (20 mg total) by mouth 2 (two) times daily.   furosemide 40 MG tablet Commonly known as: Lasix Take 1 tablet (40 mg total) by mouth daily.   hydrOXYzine 25 MG tablet Commonly known as: ATARAX Take 1 tablet (25 mg total) by mouth 3 (three) times daily as needed for itching. Started by: Jonetta Osgood, NP   iron polysaccharides 150 MG capsule Commonly known as: NIFEREX Take 1 capsule (150 mg total) by mouth daily.   lansoprazole 30 MG capsule Commonly known as: PREVACID Take 1 capsule (30 mg total) by mouth daily before breakfast.   levofloxacin 750 MG tablet Commonly known as: Levaquin Take 1 tablet (750 mg total) by mouth daily for 7 days.   meloxicam 7.5 MG tablet Commonly known as: MOBIC Take 1 tablet (7.5 mg total) by mouth 2 (two) times daily.   metoprolol succinate 50 MG 24 hr tablet Commonly known as: TOPROL-XL Take 1 tablet (50 mg total) by mouth daily. Take with or immediately following a meal.   ofloxacin 0.3 % ophthalmic solution Commonly known as: OCUFLOX Place 1 drop into both eyes 4 (four) times daily for 5 days. Started by: Jonetta Osgood, NP   OXYGEN Inhale 2.5 L into the lungs. [address] PATIENT   phenylephrine-shark liver oil-mineral oil-petrolatum 0.25-14-74.9 % rectal ointment Commonly known as: PREPARATION H Place 1 application rectally 2 (two) times daily as needed for hemorrhoids.   predniSONE 20 MG tablet Commonly known as: DELTASONE Take 1 tablet (20 mg total) by mouth 2 (two) times daily with a meal for 7 days.   rosuvastatin 20 MG tablet Commonly known as: CRESTOR Take 1 tablet (20 mg total) by mouth daily.   senna-docusate 8.6-50 MG tablet Commonly known as: Senokot-S Take 1 tablet by mouth 2 (two) times  daily.            Data Review  Micro Results Recent Results (from the past 240 hour(s))  Blood Culture (routine x 2)     Status: Abnormal   Collection Time: 12/15/22  9:56 PM   Specimen: BLOOD LEFT ARM  Result Value Ref Range Status   Specimen Description   Final    BLOOD LEFT ARM Performed at [facility], [address], [place], [place] [address]    Special Requests   Final    BOTTLES DRAWN AEROBIC AND ANAEROBIC Blood Culture results may not be optimal due to an inadequate volume of blood received in culture bottles Performed at [facility], [address], [place], [place] [address]    Culture  Setup Time   Final    GRAM NEGATIVE RODS AEROBIC BOTTLE ONLY CRITICAL VALUE NOTED.  VALUE IS CONSISTENT WITH PREVIOUSLY REPORTED AND CALLED VALUE. Performed at [facility], [address] [place], [place] [address]    Culture (A)  Final    ESCHERICHIA COLI SUSCEPTIBILITIES PERFORMED ON PREVIOUS CULTURE WITHIN THE LAST 5 DAYS. Performed at [facility], [place] [address], [place], [place] [address]    Report Status 12/19/2022 FINAL  Final  Blood Culture (routine x 2)     Status: Abnormal   Collection Time: 12/15/22  9:56 PM   Specimen: BLOOD RIGHT ARM  Result Value Ref Range Status   Specimen Description   Final    BLOOD RIGHT ARM Performed at [facility], [place] [address], [place], [place] [address]    Special Requests   Final    BOTTLES DRAWN AEROBIC AND ANAEROBIC Blood Culture adequate volume Performed at [facility], [address] [place], [place] [address]    Culture  Setup Time   Final    GRAM NEGATIVE RODS IN BOTH AEROBIC AND ANAEROBIC BOTTLES CRITICAL RESULT CALLED TO, READ BACK BY AND VERIFIED WITH: JASON ROBBINS @ 0528 12/17/22 LFD Performed at [facility], [place], [place] [address]    Culture ESCHERICHIA COLI (A)  Final   Report Status 12/19/2022 FINAL  Final   Organism ID,  Bacteria ESCHERICHIA COLI  Final      Susceptibility   Escherichia coli - MIC*    AMPICILLIN <=2 SENSITIVE Sensitive     CEFAZOLIN <=4 SENSITIVE Sensitive     CEFEPIME <=0.12 SENSITIVE Sensitive     CEFTAZIDIME <=1 SENSITIVE Sensitive     CEFTRIAXONE <=0.25 SENSITIVE Sensitive     CIPROFLOXACIN <=0.25 SENSITIVE Sensitive     GENTAMICIN <=1 SENSITIVE Sensitive     IMIPENEM <=0.25 SENSITIVE Sensitive     TRIMETH/SULFA <=20 SENSITIVE Sensitive     AMPICILLIN/SULBACTAM <=2 SENSITIVE Sensitive     PIP/TAZO <=4 SENSITIVE Sensitive     * ESCHERICHIA COLI  Blood Culture ID Panel (Reflexed)     Status: Abnormal   Collection Time: 12/15/22  9:56 PM  Result Value Ref Range Status   Enterococcus faecalis NOT DETECTED NOT DETECTED Final   Enterococcus Faecium NOT DETECTED NOT DETECTED Final   Listeria monocytogenes NOT DETECTED NOT DETECTED Final   Staphylococcus species NOT DETECTED NOT DETECTED Final   Staphylococcus aureus (BCID) NOT DETECTED NOT DETECTED Final   Staphylococcus epidermidis NOT DETECTED NOT DETECTED Final   Staphylococcus lugdunensis NOT DETECTED NOT DETECTED Final   Streptococcus species NOT DETECTED NOT DETECTED Final   Streptococcus agalactiae NOT DETECTED NOT DETECTED Final   Streptococcus pneumoniae NOT DETECTED NOT DETECTED Final   Streptococcus pyogenes NOT DETECTED NOT DETECTED Final  A.calcoaceticus-baumannii NOT DETECTED NOT DETECTED Final   Bacteroides fragilis NOT DETECTED NOT DETECTED Final   Enterobacterales DETECTED (A) NOT DETECTED Final    Comment: Enterobacterales represent a large order of gram negative bacteria, not a single organism. CRITICAL RESULT CALLED TO, READ BACK BY AND VERIFIED WITH: JASON ROBBINS @ 0528 12/17/22 LFD    Enterobacter cloacae complex NOT DETECTED NOT DETECTED Final   Escherichia coli DETECTED (A) NOT DETECTED Final    Comment: CRITICAL RESULT CALLED TO, READ BACK BY AND VERIFIED WITH: JASON ROBBINS @ 0528 12/17/22 LFD     Klebsiella aerogenes NOT DETECTED NOT DETECTED Final   Klebsiella oxytoca NOT DETECTED NOT DETECTED Final   Klebsiella pneumoniae NOT DETECTED NOT DETECTED Final   Proteus species NOT DETECTED NOT DETECTED Final   Salmonella species NOT DETECTED NOT DETECTED Final   Serratia marcescens NOT DETECTED NOT DETECTED Final   Haemophilus influenzae NOT DETECTED NOT DETECTED Final   Neisseria meningitidis NOT DETECTED NOT DETECTED Final   Pseudomonas aeruginosa NOT DETECTED NOT DETECTED Final   Stenotrophomonas maltophilia NOT DETECTED NOT DETECTED Final   Candida albicans NOT DETECTED NOT DETECTED Final   Candida auris NOT DETECTED NOT DETECTED Final   Candida glabrata NOT DETECTED NOT DETECTED Final   Candida krusei NOT DETECTED NOT DETECTED Final   Candida parapsilosis NOT DETECTED NOT DETECTED Final   Candida tropicalis NOT DETECTED NOT DETECTED Final   Cryptococcus neoformans/gattii NOT DETECTED NOT DETECTED Final   CTX-M ESBL NOT DETECTED NOT DETECTED Final   Carbapenem resistance IMP NOT DETECTED NOT DETECTED Final   Carbapenem resistance KPC NOT DETECTED NOT DETECTED Final   Carbapenem resistance NDM NOT DETECTED NOT DETECTED Final   Carbapenem resist OXA 48 LIKE NOT DETECTED NOT DETECTED Final   Carbapenem resistance VIM NOT DETECTED NOT DETECTED Final    Comment: Performed at [facility], [address] [place], [place] [address]  Resp panel by RT-PCR (RSV, Flu A&B, Covid) Anterior Nasal Swab     Status: None   Collection Time: 12/15/22 10:07 PM   Specimen: Anterior Nasal Swab  Result Value Ref Range Status   SARS Coronavirus 2 by RT PCR NEGATIVE NEGATIVE Final    Comment: (NOTE) SARS-CoV-2 target nucleic acids are NOT DETECTED.  The SARS-CoV-2 RNA is generally detectable in upper respiratory specimens during the acute phase of infection. The lowest concentration of SARS-CoV-2 viral copies this assay can detect is 138 copies/mL. A negative result does not preclude  SARS-Cov-2 infection and should not be used as the sole basis for treatment or other patient management decisions. A negative result may occur with  improper specimen collection/handling, submission of specimen other than nasopharyngeal swab, presence of viral mutation(s) within the areas targeted by this assay, and inadequate number of viral copies(<138 copies/mL). A negative result must be combined with clinical observations, patient history, and epidemiological information. The expected result is Negative.  Fact Sheet for Patients:  BloggerCourse.com  Fact Sheet for Healthcare Providers:  SeriousBroker.it  This test is no t yet approved or cleared by the [place] FDA and  has been authorized for detection and/or diagnosis of SARS-CoV-2 by FDA under an Emergency Use Authorization (EUA). This EUA will remain  in effect (meaning this test can be used) for the duration of the COVID-19 declaration under Section 564(b)(1) of the Act, 21 U.S.C.section 360bbb-3(b)(1), unless the authorization is terminated  or revoked sooner.       Influenza A by PCR NEGATIVE NEGATIVE Final  Influenza B by PCR NEGATIVE NEGATIVE Final    Comment: (NOTE) The Xpert Xpress SARS-CoV-2/FLU/RSV plus assay is intended as an aid in the diagnosis of influenza from Nasopharyngeal swab specimens and should not be used as a sole basis for treatment. Nasal washings and aspirates are unacceptable for Xpert Xpress SARS-CoV-2/FLU/RSV testing.  Fact Sheet for Patients: EntrepreneurPulse.com.au  Fact Sheet for Healthcare Providers: IncredibleEmployment.be  This test is not yet approved or cleared by the [place] FDA and has been authorized for detection and/or diagnosis of SARS-CoV-2 by FDA under an Emergency Use Authorization (EUA). This EUA will remain in effect (meaning this test can be used) for the duration of  the COVID-19 declaration under Section 564(b)(1) of the Act, 21 U.S.C. section 360bbb-3(b)(1), unless the authorization is terminated or revoked.     Resp Syncytial Virus by PCR NEGATIVE NEGATIVE Final    Comment: (NOTE) Fact Sheet for Patients: EntrepreneurPulse.com.au  Fact Sheet for Healthcare Providers: IncredibleEmployment.be  This test is not yet approved or cleared by the [place] FDA and has been authorized for detection and/or diagnosis of SARS-CoV-2 by FDA under an Emergency Use Authorization (EUA). This EUA will remain in effect (meaning this test can be used) for the duration of the COVID-19 declaration under Section 564(b)(1) of the Act, 21 U.S.C. section 360bbb-3(b)(1), unless the authorization is terminated or revoked.  Performed at [facility], [address], [place], [place] [address]   Urine Culture     Status: None   Collection Time: 12/16/22  4:12 AM   Specimen: In/Out Cath Urine  Result Value Ref Range Status   Specimen Description   Final    IN/OUT CATH URINE Performed at [facility], [address], [place], [place] [address]    Special Requests   Final    NONE Performed at [facility], [address], [place], [place] [address]    Culture   Final    NO GROWTH Performed at [facility], [place] [address] [place], [place] [address]    Report Status 12/17/2022 FINAL  Final  Expectorated Sputum Assessment w Gram Stain, Rflx to Resp Cult     Status: None   Collection Time: 12/16/22  1:32 PM   Specimen: Sputum  Result Value Ref Range Status   Specimen Description SPUTUM  Final   Special Requests EXPSU  Final   Sputum evaluation   Final    THIS SPECIMEN IS ACCEPTABLE FOR SPUTUM CULTURE Performed at [name], [address] [place], [place] [address]    Report Status 12/17/2022 FINAL  Final  Culture, Respiratory w Gram Stain     Status: None   Collection  Time: 12/16/22  1:32 PM   Specimen: SPU  Result Value Ref Range Status   Specimen Description   Final    SPUTUM Performed at [facility], [address] [place], [place] [address]    Special Requests   Final    EXPSU Reflexed from [id number] Performed at [facility], [address] [place], [place] [address]    Gram Stain   Final    ABUNDANT SQUAMOUS EPITHELIAL CELLS PRESENT ABUNDANT WBC PRESENT,BOTH PMN AND MONONUCLEAR FEW GRAM POSITIVE COCCI IN PAIRS RARE GRAM POSITIVE RODS    Culture   Final    FEW Normal respiratory flora-no Staph aureus or Pseudomonas seen Performed at [facility], [place] [address] [place], [place] [address]    Report Status 12/20/2022 FINAL  Final  Respiratory (~20 pathogens) panel by PCR     Status: None   Collection Time: 12/17/22 12:12 AM   Specimen: Nasopharyngeal Swab; Respiratory  Result Value Ref Range Status   Adenovirus NOT DETECTED NOT DETECTED Final   Coronavirus 229E NOT DETECTED NOT DETECTED Final    Comment: (NOTE) The Coronavirus on the Respiratory Panel, DOES NOT test for the novel  Coronavirus (2019 nCoV)    Coronavirus HKU1 NOT DETECTED NOT DETECTED Final   Coronavirus NL63 NOT DETECTED NOT DETECTED Final   Coronavirus OC43 NOT DETECTED NOT DETECTED Final   Metapneumovirus NOT DETECTED NOT DETECTED Final   Rhinovirus / Enterovirus NOT DETECTED NOT DETECTED Final   Influenza A NOT DETECTED NOT DETECTED Final   Influenza B NOT DETECTED NOT DETECTED Final   Parainfluenza Virus 1 NOT DETECTED NOT DETECTED Final   Parainfluenza Virus 2 NOT DETECTED NOT DETECTED Final   Parainfluenza Virus 3 NOT DETECTED NOT DETECTED Final   Parainfluenza Virus 4 NOT DETECTED NOT DETECTED Final   Respiratory Syncytial Virus NOT DETECTED NOT DETECTED Final   Bordetella pertussis NOT DETECTED NOT DETECTED Final   Bordetella Parapertussis NOT DETECTED NOT DETECTED Final   Chlamydophila pneumoniae NOT DETECTED NOT DETECTED Final    Mycoplasma pneumoniae NOT DETECTED NOT DETECTED Final    Comment: Performed at [facility], [place], [place], [place] [address]  Culture, blood (Routine X 2) w Reflex to ID Panel     Status: None   Collection Time: 12/19/22  3:54 PM   Specimen: BLOOD  Result Value Ref Range Status   Specimen Description BLOOD BLOOD RIGHT HAND  Final   Special Requests   Final    BOTTLES DRAWN AEROBIC ONLY Blood Culture adequate volume   Culture   Final    NO GROWTH 5 DAYS Performed at [facility], [address] [place], [place] [address]    Report Status 12/24/2022 FINAL  Final  Culture, blood (Routine X 2) w Reflex to ID Panel     Status: None   Collection Time: 12/19/22  3:58 PM   Specimen: BLOOD  Result Value Ref Range Status   Specimen Description BLOOD BLOOD RIGHT FOREARM  Final   Special Requests   Final    BOTTLES DRAWN AEROBIC AND ANAEROBIC Blood Culture adequate volume   Culture   Final    NO GROWTH 5 DAYS Performed at [facility], [address] [place], [place] [address]    Report Status 12/24/2022 FINAL  Final     CBC Recent Labs  Lab 12/19/22 0508 12/20/22 0250 12/21/22 0434  WBC 8.8 7.6 9.5  HGB 11.2* 11.2* 11.3*  HCT 30.8* 35.1* 30.7*  PLT 226 210 207  MCV 85.8 84.2 87.2  MCH 31.2 26.9 32.1  MCHC 36.4* 31.9 36.8*  RDW 21.1* 20.0* 22.3*  LYMPHSABS  --  0.9  --   MONOABS  --  0.3  --   EOSABS  --  0.0  --   BASOSABS  --  0.0  --     Chemistries  Recent Labs  Lab 12/19/22 0508 12/19/22 0805 12/20/22 0250 12/21/22 0434  NA 136  --  134* 132*  K 5.4*  --  4.5 4.9  CL 95*  --  96* 94*  CO2 33*  --  30 28  GLUCOSE 124*  --  149* 131*  BUN 23  --  22 23  CREATININE 1.16  --  0.92 0.99  CALCIUM 8.5*  --  8.4* 8.2*  MG  --  2.3  --   --   AST  --   --  13*  --   ALT  --   --  13  --   ALKPHOS  --   --  94  --   BILITOT  --   --  0.6  --     ------------------------------------------------------------------------------------------------------------------ estimated creatinine clearance is 117 mL/min (by C-G formula based on SCr of 0.99 mg/dL). ------------------------------------------------------------------------------------------------------------------ No results for input(s): "HGBA1C" in the last 72 hours. ------------------------------------------------------------------------------------------------------------------ No results for input(s): "CHOL", "HDL", "LDLCALC", "TRIG", "CHOLHDL", "LDLDIRECT" in the last 72 hours. ------------------------------------------------------------------------------------------------------------------ No results for input(s): "TSH", "T4TOTAL", "T3FREE", "THYROIDAB" in the last 72 hours.  Invalid input(s): "FREET3" ------------------------------------------------------------------------------------------------------------------ No results for input(s): "VITAMINB12", "FOLATE", "FERRITIN", "TIBC", "IRON", "RETICCTPCT" in the last 72 hours.  Coagulation profile No results for input(s): "INR", "PROTIME" in the last 168 hours.  No results for input(s): "DDIMER" in the last 72 hours.  Cardiac Enzymes No results for input(s): "CKMB", "TROPONINI", "MYOGLOBIN" in the last 168 hours.  Invalid input(s): "CK" ------------------------------------------------------------------------------------------------------------------ Invalid input(s): "POCBNP"  Return in about 3 months (around 03/26/2023) for F/U, Joushua Dugar PCP.   Time Spent in minutes  45 Time spent with patient included reviewing progress notes, labs, imaging studies, and discussing plan for follow up.   [place] Controlled Substance Database was reviewed by me for overdose risk score (ORS)   This patient was seen by Jonetta Osgood, FNP-C in collaboration with Dr. Clayborn Bigness as a part of collaborative care agreement.    Jonetta Osgood  MSN, FNP-C on 12/25/2022 at 10:59 AM   **Disclaimer: This note may have been dictated with voice recognition software. Similar sounding words can inadvertently be transcribed and this note may contain transcription errors which may not have been corrected upon publication of note.**

## 2023-01-01 DIAGNOSIS — J449 Chronic obstructive pulmonary disease, unspecified: Secondary | ICD-10-CM | POA: Diagnosis not present

## 2023-01-07 ENCOUNTER — Other Ambulatory Visit: Payer: Self-pay

## 2023-01-07 ENCOUNTER — Telehealth: Payer: Self-pay

## 2023-01-07 MED ORDER — IPRATROPIUM-ALBUTEROL 0.5-2.5 (3) MG/3ML IN SOLN: 3.0000 mL | Freq: Four times a day (QID) | RESPIRATORY_TRACT | 0 refills | Status: DC | PRN

## 2023-01-07 NOTE — Telephone Encounter (Signed)
Pt called that he need refills for duoneb was given in hospital  as per alyssa send med  and also ask that we went to Ed he said he is not going if worse he will go to ED

## 2023-01-07 NOTE — Telephone Encounter (Signed)
Pt called to report he woke up with SOB and extreme fatigue that would not allow him to get up and walk to the restroom without O2 dropping. Per Alyssa, advised patient to go back to the ED for evaluation.

## 2023-01-08 ENCOUNTER — Emergency Department: Payer: Medicare Other

## 2023-01-08 ENCOUNTER — Encounter
Admission: EM | Disposition: A | Discharge status: Home Health | Payer: Self-pay | Source: Home / Self Care | Attending: Internal Medicine

## 2023-01-08 ENCOUNTER — Encounter: Payer: Self-pay | Admitting: Radiology

## 2023-01-08 ENCOUNTER — Inpatient Hospital Stay
Admission: EM | Admit: 2023-01-08 | Discharge: 2023-01-11 | DRG: 163 | Disposition: A | Discharge status: Home Health | Payer: Medicare Other | Attending: Internal Medicine | Admitting: Internal Medicine

## 2023-01-08 ENCOUNTER — Other Ambulatory Visit: Payer: Self-pay

## 2023-01-08 DIAGNOSIS — Z86711 Personal history of pulmonary embolism: Secondary | ICD-10-CM

## 2023-01-08 DIAGNOSIS — R062 Wheezing: Secondary | ICD-10-CM | POA: Diagnosis not present

## 2023-01-08 DIAGNOSIS — I5189 Other ill-defined heart diseases: Secondary | ICD-10-CM | POA: Diagnosis not present

## 2023-01-08 DIAGNOSIS — N179 Acute kidney failure, unspecified: Secondary | ICD-10-CM | POA: Diagnosis not present

## 2023-01-08 DIAGNOSIS — Z1152 Encounter for screening for COVID-19: Secondary | ICD-10-CM | POA: Diagnosis not present

## 2023-01-08 DIAGNOSIS — I5033 Acute on chronic diastolic (congestive) heart failure: Secondary | ICD-10-CM | POA: Diagnosis not present

## 2023-01-08 DIAGNOSIS — J9622 Acute and chronic respiratory failure with hypercapnia: Secondary | ICD-10-CM | POA: Diagnosis present

## 2023-01-08 DIAGNOSIS — R69 Illness, unspecified: Secondary | ICD-10-CM | POA: Diagnosis not present

## 2023-01-08 DIAGNOSIS — J9601 Acute respiratory failure with hypoxia: Principal | ICD-10-CM

## 2023-01-08 DIAGNOSIS — I2692 Saddle embolus of pulmonary artery without acute cor pulmonale: Secondary | ICD-10-CM | POA: Diagnosis not present

## 2023-01-08 DIAGNOSIS — I2694 Multiple subsegmental pulmonary emboli without acute cor pulmonale: Secondary | ICD-10-CM | POA: Diagnosis not present

## 2023-01-08 DIAGNOSIS — Z9981 Dependence on supplemental oxygen: Secondary | ICD-10-CM

## 2023-01-08 DIAGNOSIS — Z6841 Body Mass Index (BMI) 40.0 and over, adult: Secondary | ICD-10-CM

## 2023-01-08 DIAGNOSIS — I2510 Atherosclerotic heart disease of native coronary artery without angina pectoris: Secondary | ICD-10-CM | POA: Diagnosis not present

## 2023-01-08 DIAGNOSIS — I110 Hypertensive heart disease with heart failure: Secondary | ICD-10-CM | POA: Diagnosis not present

## 2023-01-08 DIAGNOSIS — F17210 Nicotine dependence, cigarettes, uncomplicated: Secondary | ICD-10-CM | POA: Diagnosis present

## 2023-01-08 DIAGNOSIS — R739 Hyperglycemia, unspecified: Secondary | ICD-10-CM | POA: Diagnosis present

## 2023-01-08 DIAGNOSIS — I5032 Chronic diastolic (congestive) heart failure: Secondary | ICD-10-CM | POA: Diagnosis not present

## 2023-01-08 DIAGNOSIS — I2602 Saddle embolus of pulmonary artery with acute cor pulmonale: Secondary | ICD-10-CM

## 2023-01-08 DIAGNOSIS — Z8249 Family history of ischemic heart disease and other diseases of the circulatory system: Secondary | ICD-10-CM | POA: Diagnosis not present

## 2023-01-08 DIAGNOSIS — R7303 Prediabetes: Secondary | ICD-10-CM | POA: Diagnosis not present

## 2023-01-08 DIAGNOSIS — Z8673 Personal history of transient ischemic attack (TIA), and cerebral infarction without residual deficits: Secondary | ICD-10-CM | POA: Diagnosis not present

## 2023-01-08 DIAGNOSIS — J449 Chronic obstructive pulmonary disease, unspecified: Secondary | ICD-10-CM | POA: Diagnosis present

## 2023-01-08 DIAGNOSIS — J9621 Acute and chronic respiratory failure with hypoxia: Secondary | ICD-10-CM | POA: Diagnosis present

## 2023-01-08 DIAGNOSIS — E785 Hyperlipidemia, unspecified: Secondary | ICD-10-CM | POA: Diagnosis not present

## 2023-01-08 DIAGNOSIS — R0602 Shortness of breath: Secondary | ICD-10-CM | POA: Diagnosis not present

## 2023-01-08 DIAGNOSIS — Z7901 Long term (current) use of anticoagulants: Secondary | ICD-10-CM

## 2023-01-08 DIAGNOSIS — I2699 Other pulmonary embolism without acute cor pulmonale: Secondary | ICD-10-CM | POA: Diagnosis present

## 2023-01-08 DIAGNOSIS — Z820 Family history of epilepsy and other diseases of the nervous system: Secondary | ICD-10-CM | POA: Diagnosis not present

## 2023-01-08 DIAGNOSIS — J441 Chronic obstructive pulmonary disease with (acute) exacerbation: Secondary | ICD-10-CM | POA: Diagnosis present

## 2023-01-08 DIAGNOSIS — J439 Emphysema, unspecified: Secondary | ICD-10-CM | POA: Diagnosis not present

## 2023-01-08 DIAGNOSIS — Z743 Need for continuous supervision: Secondary | ICD-10-CM | POA: Diagnosis not present

## 2023-01-08 HISTORY — PX: PULMONARY THROMBECTOMY: CATH118295

## 2023-01-08 LAB — BASIC METABOLIC PANEL
Anion gap: 9 (ref 5–15)
BUN: 17 mg/dL (ref 8–23)
CO2: 27 mmol/L (ref 22–32)
Calcium: 8.2 mg/dL — ABNORMAL LOW (ref 8.9–10.3)
Chloride: 98 mmol/L (ref 98–111)
Creatinine, Ser: 1.29 mg/dL — ABNORMAL HIGH (ref 0.61–1.24)
GFR, Estimated: 60 mL/min — ABNORMAL LOW (ref >60)
Glucose, Bld: 182 mg/dL — ABNORMAL HIGH (ref 70–99)
Potassium: 4.2 mmol/L (ref 3.5–5.1)
Sodium: 134 mmol/L — ABNORMAL LOW (ref 135–145)

## 2023-01-08 LAB — RESP PANEL BY RT-PCR (RSV, FLU A&B, COVID)  RVPGX2
Influenza A by PCR: NEGATIVE
Influenza B by PCR: NEGATIVE
Resp Syncytial Virus by PCR: NEGATIVE
SARS Coronavirus 2 by RT PCR: NEGATIVE

## 2023-01-08 LAB — PROTIME-INR
INR: 1.2 (ref 0.8–1.2)
Prothrombin Time: 14.7 seconds (ref 11.4–15.2)

## 2023-01-08 LAB — APTT: aPTT: 31 seconds (ref 24–36)

## 2023-01-08 LAB — CBC
HCT: 40.9 % (ref 39.0–52.0)
Hemoglobin: 12.7 g/dL — ABNORMAL LOW (ref 13.0–17.0)
MCH: 26.4 pg (ref 26.0–34.0)
MCHC: 31.1 g/dL (ref 30.0–36.0)
MCV: 85 fL (ref 80.0–100.0)
Platelets: 169 10*3/uL (ref 150–400)
RBC: 4.81 MIL/uL (ref 4.22–5.81)
RDW: 21.2 % — ABNORMAL HIGH (ref 11.5–15.5)
WBC: 8.7 10*3/uL (ref 4.0–10.5)
nRBC: 0 % (ref 0.0–0.2)

## 2023-01-08 LAB — TROPONIN I (HIGH SENSITIVITY)
Troponin I (High Sensitivity): 45 ng/L — ABNORMAL HIGH (ref <18)
Troponin I (High Sensitivity): 43 ng/L — ABNORMAL HIGH (ref <18)

## 2023-01-08 LAB — GLUCOSE, CAPILLARY
Glucose-Capillary: 250 mg/dL — ABNORMAL HIGH (ref 70–99)
Glucose-Capillary: 268 mg/dL — ABNORMAL HIGH (ref 70–99)

## 2023-01-08 LAB — HEPARIN LEVEL (UNFRACTIONATED): Heparin Unfractionated: 0.2 IU/mL — ABNORMAL LOW (ref 0.30–0.70)

## 2023-01-08 LAB — MRSA NEXT GEN BY PCR, NASAL: MRSA by PCR Next Gen: NOT DETECTED

## 2023-01-08 LAB — LACTIC ACID, PLASMA
Lactic Acid, Venous: 4 mmol/L (ref 0.5–1.9)
Lactic Acid, Venous: 3.6 mmol/L (ref 0.5–1.9)

## 2023-01-08 LAB — BRAIN NATRIURETIC PEPTIDE: B Natriuretic Peptide: 797.5 pg/mL — ABNORMAL HIGH (ref 0.0–100.0)

## 2023-01-08 SURGERY — PULMONARY THROMBECTOMY
Anesthesia: Moderate Sedation | Laterality: Bilateral

## 2023-01-08 MED ORDER — IOHEXOL 350 MG/ML SOLN
75.0000 mL | Freq: Once | INTRAVENOUS | Status: AC | PRN
  Administered 2023-01-08: 75 mL via INTRAVENOUS

## 2023-01-08 MED ORDER — HYDROMORPHONE HCL 1 MG/ML IJ SOLN: 1.0000 mg | Freq: Once | INTRAMUSCULAR | Status: DC | PRN

## 2023-01-08 MED ORDER — HEPARIN SODIUM (PORCINE) 1000 UNIT/ML IJ SOLN
INTRAMUSCULAR | Status: DC | PRN
  Administered 2023-01-08: 4000 [IU] via INTRAVENOUS

## 2023-01-08 MED ORDER — CEFAZOLIN SODIUM-DEXTROSE 2-4 GM/100ML-% IV SOLN
INTRAVENOUS | Status: AC
  Filled 2023-01-08: qty 100

## 2023-01-08 MED ORDER — CEFAZOLIN SODIUM-DEXTROSE 2-4 GM/100ML-% IV SOLN: 2.0000 g | INTRAVENOUS | Status: DC

## 2023-01-08 MED ORDER — ONDANSETRON HCL 4 MG/2ML IJ SOLN: 4.0000 mg | Freq: Four times a day (QID) | INTRAMUSCULAR | Status: DC | PRN

## 2023-01-08 MED ORDER — SODIUM CHLORIDE 0.9% FLUSH
3.0000 mL | Freq: Two times a day (BID) | INTRAVENOUS | Status: DC
  Administered 2023-01-08 – 2023-01-11 (×6): 3 mL via INTRAVENOUS

## 2023-01-08 MED ORDER — IPRATROPIUM-ALBUTEROL 0.5-2.5 (3) MG/3ML IN SOLN
9.0000 mL | Freq: Once | RESPIRATORY_TRACT | Status: AC
  Administered 2023-01-08: 9 mL via RESPIRATORY_TRACT
  Filled 2023-01-08: qty 9

## 2023-01-08 MED ORDER — ROSUVASTATIN CALCIUM 20 MG PO TABS
20.0000 mg | ORAL_TABLET | Freq: Every day | ORAL | Status: DC
  Administered 2023-01-09 – 2023-01-11 (×3): 20 mg via ORAL
  Filled 2023-01-08: qty 1
  Filled 2023-01-08: qty 2
  Filled 2023-01-08 (×2): qty 1

## 2023-01-08 MED ORDER — HEPARIN (PORCINE) 25000 UT/250ML-% IV SOLN
2150.0000 [IU]/h | INTRAVENOUS | Status: DC
  Administered 2023-01-08: 1900 [IU]/h via INTRAVENOUS
  Administered 2023-01-09 – 2023-01-10 (×3): 2150 [IU]/h via INTRAVENOUS
  Filled 2023-01-08 (×3): qty 250

## 2023-01-08 MED ORDER — IPRATROPIUM-ALBUTEROL 0.5-2.5 (3) MG/3ML IN SOLN: 3.0000 mL | Freq: Four times a day (QID) | RESPIRATORY_TRACT | Status: DC | PRN

## 2023-01-08 MED ORDER — ACETAMINOPHEN 650 MG RE SUPP: 650.0000 mg | Freq: Four times a day (QID) | RECTAL | Status: DC | PRN

## 2023-01-08 MED ORDER — ALTEPLASE 2 MG IJ SOLR
INTRAMUSCULAR | Status: AC
  Filled 2023-01-08: qty 12

## 2023-01-08 MED ORDER — MIDAZOLAM HCL 2 MG/ML PO SYRP: 8.0000 mg | ORAL_SOLUTION | Freq: Once | ORAL | Status: DC | PRN

## 2023-01-08 MED ORDER — ACETAMINOPHEN 325 MG PO TABS: 650.0000 mg | ORAL_TABLET | Freq: Four times a day (QID) | ORAL | Status: DC | PRN

## 2023-01-08 MED ORDER — SODIUM CHLORIDE 0.9 % IV SOLN: INTRAVENOUS | Status: DC

## 2023-01-08 MED ORDER — ALTEPLASE 1 MG/ML SYRINGE FOR VASCULAR PROCEDURE
INTRAMUSCULAR | Status: DC | PRN
  Administered 2023-01-08: 8 mg
  Administered 2023-01-08: 4 mg

## 2023-01-08 MED ORDER — HEPARIN (PORCINE) 25000 UT/250ML-% IV SOLN
1900.0000 [IU]/h | INTRAVENOUS | Status: DC
  Administered 2023-01-08: 1900 [IU]/h via INTRAVENOUS
  Filled 2023-01-08: qty 250

## 2023-01-08 MED ORDER — FAMOTIDINE 20 MG PO TABS: 40.0000 mg | ORAL_TABLET | Freq: Once | ORAL | Status: DC | PRN

## 2023-01-08 MED ORDER — CEFAZOLIN SODIUM-DEXTROSE 1-4 GM/50ML-% IV SOLN
INTRAVENOUS | Status: DC | PRN
  Administered 2023-01-08: 2 g via INTRAVENOUS

## 2023-01-08 MED ORDER — UMECLIDINIUM BROMIDE 62.5 MCG/ACT IN AEPB
1.0000 | INHALATION_SPRAY | Freq: Every day | RESPIRATORY_TRACT | Status: DC
  Administered 2023-01-08 – 2023-01-11 (×4): 1 via RESPIRATORY_TRACT
  Filled 2023-01-08: qty 7

## 2023-01-08 MED ORDER — INSULIN ASPART 100 UNIT/ML IJ SOLN
0.0000 [IU] | Freq: Three times a day (TID) | INTRAMUSCULAR | Status: DC
  Administered 2023-01-09 – 2023-01-10 (×5): 2 [IU] via SUBCUTANEOUS
  Filled 2023-01-08 (×5): qty 1

## 2023-01-08 MED ORDER — HEPARIN BOLUS VIA INFUSION
7000.0000 [IU] | Freq: Once | INTRAVENOUS | Status: AC
  Administered 2023-01-08: 7000 [IU] via INTRAVENOUS
  Filled 2023-01-08: qty 7000

## 2023-01-08 MED ORDER — HEPARIN SODIUM (PORCINE) 1000 UNIT/ML IJ SOLN
INTRAMUSCULAR | Status: AC
  Filled 2023-01-08: qty 10

## 2023-01-08 MED ORDER — MOMETASONE FURO-FORMOTEROL FUM 100-5 MCG/ACT IN AERO
2.0000 | INHALATION_SPRAY | Freq: Two times a day (BID) | RESPIRATORY_TRACT | Status: DC
  Administered 2023-01-08 – 2023-01-11 (×6): 2 via RESPIRATORY_TRACT
  Filled 2023-01-08: qty 8.8

## 2023-01-08 MED ORDER — METHYLPREDNISOLONE SODIUM SUCC 125 MG IJ SOLR: 125.0000 mg | Freq: Once | INTRAMUSCULAR | Status: DC | PRN

## 2023-01-08 MED ORDER — FENTANYL CITRATE PF 50 MCG/ML IJ SOSY: 12.5000 ug | PREFILLED_SYRINGE | Freq: Once | INTRAMUSCULAR | Status: DC | PRN

## 2023-01-08 MED ORDER — CHLORHEXIDINE GLUCONATE CLOTH 2 % EX PADS
6.0000 | MEDICATED_PAD | Freq: Every day | CUTANEOUS | Status: DC
  Administered 2023-01-08: 6 via TOPICAL

## 2023-01-08 MED ORDER — DIPHENHYDRAMINE HCL 50 MG/ML IJ SOLN: 50.0000 mg | Freq: Once | INTRAMUSCULAR | Status: DC | PRN

## 2023-01-08 MED ORDER — FENTANYL CITRATE (PF) 100 MCG/2ML IJ SOLN
INTRAMUSCULAR | Status: AC
  Filled 2023-01-08: qty 2

## 2023-01-08 MED ORDER — MIDAZOLAM HCL 2 MG/2ML IJ SOLN
INTRAMUSCULAR | Status: DC | PRN
  Administered 2023-01-08 (×2): .5 mg via INTRAVENOUS
  Administered 2023-01-08: 1 mg via INTRAVENOUS
  Administered 2023-01-08: .5 mg via INTRAVENOUS

## 2023-01-08 MED ORDER — POLYETHYLENE GLYCOL 3350 17 G PO PACK: 17.0000 g | PACK | Freq: Every day | ORAL | Status: DC | PRN

## 2023-01-08 MED ORDER — CHLORHEXIDINE GLUCONATE CLOTH 2 % EX PADS
6.0000 | MEDICATED_PAD | Freq: Once | CUTANEOUS | Status: DC
  Administered 2023-01-08: 6 via TOPICAL

## 2023-01-08 MED ORDER — MIDAZOLAM HCL 5 MG/5ML IJ SOLN
INTRAMUSCULAR | Status: AC
  Filled 2023-01-08: qty 5

## 2023-01-08 MED ORDER — ONDANSETRON HCL 4 MG PO TABS: 4.0000 mg | ORAL_TABLET | Freq: Four times a day (QID) | ORAL | Status: DC | PRN

## 2023-01-08 SURGICAL SUPPLY — 23 items
CANISTER PENUMBRA ENGINE (MISCELLANEOUS) IMPLANT
CATH ANGIO 5F PIGTAIL 100CM (CATHETERS) IMPLANT
CATH INDIGO 12XTORQ 100 (CATHETERS) IMPLANT
CATH INDIGO SEP 12 (CATHETERS) IMPLANT
CATH INFINITI JR4 5F (CATHETERS) IMPLANT
CATH LIGHTNING 8 XTORQ 115 (CATHETERS) IMPLANT
CLOSURE PERCLOSE PROSTYLE (VASCULAR PRODUCTS) IMPLANT
COVER PROBE U/S 5X48 (MISCELLANEOUS) IMPLANT
DEVICE SAFEGUARD 24CM (GAUZE/BANDAGES/DRESSINGS) IMPLANT
DEVICE TORQUE .025-.038 (MISCELLANEOUS) IMPLANT
GLIDEWIRE ADV .035X260CM (WIRE) IMPLANT
GLIDEWIRE ANGLED SS 035X260CM (WIRE) IMPLANT
KIT FEM OPTION ELITE FILTER (Filter) IMPLANT
NDL ENTRY 21GA 7CM ECHOTIP (NEEDLE) IMPLANT
NEEDLE ENTRY 21GA 7CM ECHOTIP (NEEDLE) ×1 IMPLANT
PACK ANGIOGRAPHY (CUSTOM PROCEDURE TRAY) ×1 IMPLANT
SHEATH BRITE TIP 6FRX11 (SHEATH) IMPLANT
SHEATH PINNACLE 11FRX10 (SHEATH) IMPLANT
SUT MNCRL AB 4-0 PS2 18 (SUTURE) IMPLANT
TUBING CONTRAST HIGH PRESS 72 (TUBING) IMPLANT
WIRE AMPLATZ SSTIFF .035X260CM (WIRE) IMPLANT
WIRE GUIDERIGHT .035X150 (WIRE) IMPLANT
WIRE SUPRACORE 300CM (MISCELLANEOUS) IMPLANT

## 2023-01-08 NOTE — ED Triage Notes (Signed)
Pt via [facility] from home. Pt c/o SOB since this AM. Pt has hx of COPD/CHF. 90% on 2L, Pt placed on couple of liter, then CPAP, pt now on 15L [place]. Pt does wear 3L [place] chronically. Denies any pain. Pt has hx of PNA and PE.  A&Ox4 but labored breathing.  158/96 97% on 15L  24 RR 1 Duoneb given, 125mg  of Solu-Medrol  20 G L AC

## 2023-01-08 NOTE — ED Notes (Signed)
Report to [place], [place]

## 2023-01-08 NOTE — Progress Notes (Signed)
Pt was placed on bipap during procedure, was removed post procedure, and placed on non rebreather mask, sats 93%, tolerating well.

## 2023-01-08 NOTE — ED Provider Notes (Signed)
[facility] Provider Note    Event Date/Time   First MD Initiated Contact with Patient 01/08/23 1130     (approximate)   History   Respiratory Distress   HPI  Thomas Tanner is a 72 y.o. male past medical history significant for COPD on 3 L nasal cannula, HFpEF, saddle PE no longer on Eliquis (patient self discontinued secondary to inability to afford), hypertension, morbid obesity, presents to the emergency department shortness of breath.  States that he has had a mild cough since he left the hospital on the 16th.  Significantly worsened last night and today.  Endorses a productive cough and chest pain.  Feels like something is sitting on his chest.  Denies any fever or chills.  States that he is no longer on Eliquis because he is unable to afford this medication, not taking since Aug.  Denies any falls or trauma.  Received DuoNeb treatment and IV Solu-Medrol 125 mg with EMS prior to arrival.     Physical Exam      Most recent vital signs: Vitals:   01/08/23 1239 01/08/23 1400  BP: 107/70 102/72  Pulse: (!) 108 (!) 105  Resp: 20 20  SpO2: 94% 91%    Physical Exam Constitutional:      Appearance: He is well-developed. He is obese.  HENT:     Head: Atraumatic.  Eyes:     Conjunctiva/sclera: Conjunctivae normal.  Cardiovascular:     Rate and Rhythm: Regular rhythm. Tachycardia present.  Pulmonary:     Effort: Respiratory distress present.     Breath sounds: Wheezing present.     Comments: 5 L nasal cannula at 90%.  Diffuse inspiratory and expiratory wheezing throughout all lung fields with poor air movement.  Speaking in 2 word sentences. Musculoskeletal:        General: Normal range of motion.     Cervical back: Normal range of motion.     Right lower leg: No edema.     Left lower leg: No edema.  Skin:    General: Skin is warm.     Capillary Refill: Capillary refill takes less than 2 seconds.  Neurological:     Mental Status:  He is alert. Mental status is at baseline.  Psychiatric:        Mood and Affect: Mood normal.     IMPRESSION / MDM / [place] / ED COURSE  I reviewed the triage vital signs and the nursing notes.  On chart review patient had a recent hospitalization and was discharged on 1/16 following hospitalization for respiratory distress and community-acquired pneumonia.  Differential diagnosis including pulmonary embolism, pneumonia, COPD exacerbation, heart failure exacerbation, ACS  EKG  I, Nathaniel Man, the attending physician, personally viewed and interpreted this ECG.   Rate: 102  Rhythm: Tachycardic  Axis: Normal  Intervals: Right bundle branch block  ST&T Change: None No significant change when compared to prior EKG  No tachycardic or bradycardic dysrhythmias while on cardiac telemetry.  RADIOLOGY I independently reviewed imaging, my interpretation of imaging: CT angiography concerning for multiple pulmonary embolism with right heart strain.  Discussed with the radiologist on-call.  LABS (all labs ordered are listed, but only abnormal results are displayed) Labs interpreted as -  Mildly elevated troponin.  Elevated BNP.  Significantly elevated lactic acid of 4.0 however do not feel that this is secondary to sepsis but secondary to his large pulmonary embolism.  Labs Reviewed  BASIC METABOLIC PANEL - Abnormal; Notable  for the following components:      Result Value   Sodium 134 (*)    Glucose, Bld 182 (*)    Creatinine, Ser 1.29 (*)    Calcium 8.2 (*)    GFR, Estimated 60 (*)    All other components within normal limits  CBC - Abnormal; Notable for the following components:   Hemoglobin 12.7 (*)    RDW 21.2 (*)    All other components within normal limits  BRAIN NATRIURETIC PEPTIDE - Abnormal; Notable for the following components:   B Natriuretic Peptide 797.5 (*)    All other components within normal limits  BLOOD GAS, VENOUS - Abnormal; Notable for the  following components:   Bicarbonate 33.1 (*)    Acid-Base Excess 7.2 (*)    All other components within normal limits  LACTIC ACID, PLASMA - Abnormal; Notable for the following components:   Lactic Acid, Venous 4.0 (*)    All other components within normal limits  LACTIC ACID, PLASMA - Abnormal; Notable for the following components:   Lactic Acid, Venous 3.6 (*)    All other components within normal limits  TROPONIN I (HIGH SENSITIVITY) - Abnormal; Notable for the following components:   Troponin I (High Sensitivity) 45 (*)    All other components within normal limits  TROPONIN I (HIGH SENSITIVITY) - Abnormal; Notable for the following components:   Troponin I (High Sensitivity) 43 (*)    All other components within normal limits  RESP PANEL BY RT-PCR (RSV, FLU A&B, COVID)  RVPGX2  CULTURE, BLOOD (ROUTINE X 2)  CULTURE, BLOOD (ROUTINE X 2)  PROTIME-INR  APTT    TREATMENT  DuoNeb treatment  Heparin bolus and infusion  MDM   Consulted hospitalist for admission for pulmonary embolism with right heart strain.  They will consult and discussed with vascular surgery to see if he is a candidate for possible thrombectomy.  Patient is currently hemodynamically stable.   PROCEDURES:  Critical Care performed: Yes  .Critical Care  Performed by: Nathaniel Man, MD Authorized by: Nathaniel Man, MD   Critical care provider statement:    Critical care time (minutes):  60   Critical care time was exclusive of:  Separately billable procedures and treating other patients   Critical care was necessary to treat or prevent imminent or life-threatening deterioration of the following conditions:  Circulatory failure   Critical care was time spent personally by me on the following activities:  Development of treatment plan with patient or surrogate, discussions with consultants, evaluation of patient's response to treatment, examination of patient, ordering and review of laboratory studies,  ordering and review of radiographic studies, ordering and performing treatments and interventions, pulse oximetry, re-evaluation of patient's condition and review of old charts   Patient's presentation is most consistent with acute presentation with potential threat to life or bodily function.   MEDICATIONS ORDERED IN ED: Medications  heparin bolus via infusion 7,000 Units (has no administration in time range)  heparin ADULT infusion 100 units/mL (25000 units/250mL) (has no administration in time range)  ipratropium-albuterol (DUONEB) 0.5-2.5 (3) MG/3ML nebulizer solution 9 mL (9 mLs Nebulization Given 01/08/23 1146)  iohexol (OMNIPAQUE) 350 MG/ML injection 75 mL (75 mLs Intravenous Contrast Given 01/08/23 1338)    FINAL CLINICAL IMPRESSION(S) / ED DIAGNOSES   Final diagnoses:  Acute respiratory failure with hypoxia ([place])  Acute saddle pulmonary embolism with acute cor pulmonale ([place])     Rx / DC Orders   ED Discharge Orders  None        Note:  This document was prepared using Dragon voice recognition software and may include unintentional dictation errors.   Nathaniel Man, MD 01/08/23 1438

## 2023-01-08 NOTE — ED Notes (Signed)
Lab called to run aptt and INR from previous sent blood.

## 2023-01-08 NOTE — ED Notes (Signed)
Report to specials recovery RN. Pt taken there prior to unit per request of specials nurse.

## 2023-01-08 NOTE — Consult Note (Signed)
Hospital Consult    Reason for Consult:  Submassive Pulmonary Embolism  Requesting Physician:  Dr Revonda Humphrey MD  MRN #:  [id number]  History of Present Illness: This is a 72 y.o. male past medical history significant for COPD on 3 L nasal cannula, HFpEF, saddle PE no longer on Eliquis (patient self discontinued secondary to inability to afford), hypertension, morbid obesity, presents to the emergency department shortness of breath.  States that he has had a mild cough since he left the hospital on the 16th.  Significantly worsened last night and today.  Endorses a productive cough and chest pain.  Feels like something is sitting on his chest.  Denies any fever or chills.  States that he is no longer on Eliquis because he is unable to afford this medication, not taking since Aug.     On exam he is on a running heparin drip. He is tachycardic and on nasal canula oxygen at 8 liters. His left lower extremity in painful to palpation, which may be a DVT contributing to his pulmonary Embolism.    Past Medical History:  Diagnosis Date   Arthritis    CHF (congestive heart failure) (HCC)    COPD (chronic obstructive pulmonary disease) (HCC)    Diabetes ([place])    Hyperlipidemia    Hypertension    Kidney stone    Obesity    Tachycardia    TIA (transient ischemic attack)     Past Surgical History:  Procedure Laterality Date   CORONARY/GRAFT ACUTE MI REVASCULARIZATION N/A 12/08/2018   Procedure: Coronary/Graft Acute MI Revascularization;  Surgeon: Yolonda Kida, MD;  Location: [place] [facility];  Service: Cardiovascular;  Laterality: N/A;   INCISION AND DRAINAGE ABSCESS N/A 05/18/2021   Procedure: INCISION AND DRAINAGE SCROTAL ABSCESS, cystoscopy with urethral dilation, and complex catheter placement;  Surgeon: Billey Co, MD;  Location: [facility];  Service: Urology;  Laterality: N/A;   IRRIGATION AND DEBRIDEMENT ABSCESS N/A 05/20/2021   Procedure: IRRIGATION AND DEBRIDEMENT ABSCESS;   Surgeon: Abbie Sons, MD;  Location: [facility];  Service: Urology;  Laterality: N/A;   IRRIGATION AND DEBRIDEMENT ABSCESS N/A 05/21/2021   Procedure: IRRIGATION AND DEBRIDEMENT ABSCESS;  Surgeon: Abbie Sons, MD;  Location: [facility];  Service: Urology;  Laterality: N/A;   KIDNEY STONE SURGERY     left arm surgery  1963   LEFT HEART CATH AND CORONARY ANGIOGRAPHY N/A 12/08/2018   Procedure: LEFT HEART CATH AND CORONARY ANGIOGRAPHY;  Surgeon: Yolonda Kida, MD;  Location: [place] [facility];  Service: Cardiovascular;  Laterality: N/A;   PULMONARY THROMBECTOMY N/A 08/03/2020   Procedure: PULMONARY THROMBECTOMY / THROMBOLYSIS;  Surgeon: Algernon Huxley, MD;  Location: [place] [facility];  Service: Cardiovascular;  Laterality: N/A;   RECTAL EXAM UNDER ANESTHESIA N/A 05/20/2021   Procedure: EXAM UNDER ANESTHESIA-Dressing and Change;  Surgeon: Abbie Sons, MD;  Location: [facility];  Service: Urology;  Laterality: N/A;    Allergies  Allergen Reactions   Morphine Anxiety and Other (See Comments)    Hallucinations  Pt states 6.17.22 that he is not allergic to this medication   Azithromycin Itching    Localized pruritus on arm with IV   Morphine And Related Anxiety    Prior to Admission medications   Medication Sig Start Date End Date Taking? Authorizing Provider  albuterol (VENTOLIN HFA) 108 (90 Base) MCG/ACT inhaler Inhale 2 puffs into the lungs every 6 (six) hours as needed for wheezing or shortness of  breath. 07/17/22  Yes Abernathy, Yetta Flock, NP  ALPRAZolam (XANAX) 0.25 MG tablet Take 1 tablet (0.25 mg total) by mouth daily as needed (severe anxiety). May take 1 additional dose after 2 hours if the first dose is not effective. 12/25/22  Yes Abernathy, Yetta Flock, NP  famotidine (PEPCID) 20 MG tablet Take 1 tablet (20 mg total) by mouth 2 (two) times daily. 07/17/22  Yes Abernathy, Yetta Flock, NP  furosemide (LASIX) 40 MG tablet Take 1 tablet (40 mg total) by mouth daily. 12/21/22 12/21/23 Yes  Vasireddy, Grier Mitts, MD  hydrOXYzine (ATARAX) 25 MG tablet Take 1 tablet (25 mg total) by mouth 3 (three) times daily as needed for itching. 12/25/22  Yes Abernathy, Alyssa, NP  ipratropium-albuterol (DUONEB) 0.5-2.5 (3) MG/3ML SOLN Take 3 mLs by nebulization every 6 (six) hours as needed. 01/07/23  Yes Abernathy, Yetta Flock, NP  iron polysaccharides (NIFEREX) 150 MG capsule Take 1 capsule (150 mg total) by mouth daily. 05/27/21  Yes Sharen Hones, MD  lansoprazole (PREVACID) 30 MG capsule Take 1 capsule (30 mg total) by mouth daily before breakfast. 07/17/22  Yes Abernathy, Alyssa, NP  meloxicam (MOBIC) 7.5 MG tablet Take 1 tablet (7.5 mg total) by mouth 2 (two) times daily. 09/18/22  Yes Abernathy, Yetta Flock, NP  metoprolol succinate (TOPROL-XL) 50 MG 24 hr tablet Take 1 tablet (50 mg total) by mouth daily. Take with or immediately following a meal. 07/17/22  Yes Abernathy, Alyssa, NP  phenylephrine-shark liver oil-mineral oil-petrolatum (PREPARATION H) 0.25-14-74.9 % rectal ointment Place 1 application rectally 2 (two) times daily as needed for hemorrhoids. 05/16/21  Yes Sreenath, Sudheer B, MD  rosuvastatin (CRESTOR) 20 MG tablet Take 1 tablet (20 mg total) by mouth daily. 07/17/22  Yes Abernathy, Alyssa, NP  senna-docusate (SENOKOT-S) 8.6-50 MG tablet Take 1 tablet by mouth 2 (two) times daily. 05/16/21  Yes Sreenath, Sudheer B, MD  vitamin B-12 1000 MCG tablet Take 1 tablet (1,000 mcg total) by mouth daily. 05/27/21  Yes Sharen Hones, MD  OXYGEN Inhale 2.5 L into the lungs. [place] PATIENT    [provider]    Social History   Socioeconomic History   Marital status: Married    Spouse name: [name]    Number of children: 3   Years of education: Not on file   Highest education level: Not on file  Occupational History   Not on file  Tobacco Use   Smoking status: Every Day    Packs/day: 0.50    Types: Cigarettes    Last attempt to quit: 07/13/2020    Years since  quitting: 2.4    Passive exposure: Current   Smokeless tobacco: Never  Substance and Sexual Activity   Alcohol use: No   Drug use: No   Sexual activity: Not Currently  Other Topics Concern   Not on file  Social History Narrative   Lives at home with wife , youngest son, grand daughter and great greatson    Social Determinants of Health   Financial Resource Strain: Not on file  Food Insecurity: No Food Insecurity (12/17/2022)   Hunger Vital Sign    Worried About Running Out of Food in the Last Year: Never true    Ran Out of Food in the Last Year: Never true  Transportation Needs: No Transportation Needs (12/17/2022)   PRAPARE - Hydrologist (Medical): No    Lack of Transportation (Non-Medical): No  Physical Activity: Unknown (12/08/2018)   Exercise Vital Sign  Days of Exercise per Week: 0 days    Minutes of Exercise per Session: Not on file  Stress: No Stress Concern Present (12/08/2018)   [place]    Feeling of Stress : Only a little  Social Connections: Moderately Integrated (12/08/2018)   Social Connection and Isolation Panel [NHANES]    Frequency of Communication with Friends and Family: More than three times a week    Frequency of Social Gatherings with Friends and Family: Three times a week    Attends Religious Services: 1 to 4 times per year    Active Member of Clubs or Organizations: No    Attends Archivist Meetings: Never    Marital Status: Married  Human resources officer Violence: Not At Risk (12/17/2022)   Humiliation, Afraid, Rape, and Kick questionnaire    Fear of Current or Ex-Partner: No    Emotionally Abused: No    Physically Abused: No    Sexually Abused: No     Family History  Problem Relation Age of Onset   Alzheimer's disease Mother    CAD Father     ROS: Otherwise negative unless mentioned in HPI  Physical Examination  Vitals:   01/08/23 1400  01/08/23 1500  BP: 102/72 99/72  Pulse: (!) 105 (!) 106  Resp: 20 (!) 35  SpO2: 91% 96%   Body mass index is 44.86 kg/m.  General:  WDWN in NAD Gait: Not observed HENT: WNL, normocephalic Pulmonary: normal non-labored breathing, without Rales, rhonchi,  wheezing Cardiac: regular, with PVC's, without  Murmurs, rubs or gallops; without carotid bruits Abdomen: positive bowel sounds, soft, NT/ND, no masses Skin: without rashes but extremely dry Vascular Exam/Pulses: Unable to palpate in lower extremities Extremities: without ischemic changes, without Gangrene , without cellulitis; without open wounds; Positive painful palpation to left calf.  Musculoskeletal: no muscle wasting or atrophy  Neurologic: A&O X 3;  No focal weakness or paresthesias are detected; speech is fluent/normal Psychiatric:  The pt has Normal affect. Lymph:  Unremarkable  CBC    Component Value Date/Time   WBC 8.7 01/08/2023 1133   RBC 4.81 01/08/2023 1133   HGB 12.7 (L) 01/08/2023 1133   HGB 14.6 06/11/2013 0404   HCT 40.9 01/08/2023 1133   HCT 42.1 06/11/2013 0404   PLT 169 01/08/2023 1133   PLT 212 06/11/2013 0404   MCV 85.0 01/08/2023 1133   MCV 92 06/11/2013 0404   MCH 26.4 01/08/2023 1133   MCHC 31.1 01/08/2023 1133   RDW 21.2 (H) 01/08/2023 1133   RDW 14.4 06/11/2013 0404   LYMPHSABS 0.9 12/20/2022 0250   LYMPHSABS 2.5 06/11/2013 0404   MONOABS 0.3 12/20/2022 0250   MONOABS 0.7 06/11/2013 0404   EOSABS 0.0 12/20/2022 0250   EOSABS 0.3 06/11/2013 0404   BASOSABS 0.0 12/20/2022 0250   BASOSABS 0.1 06/11/2013 0404    BMET    Component Value Date/Time   NA 134 (L) 01/08/2023 1133   NA 140 06/11/2013 0404   K 4.2 01/08/2023 1133   K 5.1 06/11/2013 0404   CL 98 01/08/2023 1133   CL 107 06/11/2013 0404   CO2 27 01/08/2023 1133   CO2 29 06/11/2013 0404   GLUCOSE 182 (H) 01/08/2023 1133   GLUCOSE 97 06/11/2013 0404   BUN 17 01/08/2023 1133   BUN 18 06/11/2013 0404   CREATININE 1.29 (H)  01/08/2023 1133   CREATININE 0.87 06/11/2013 0404   CALCIUM 8.2 (L) 01/08/2023 1133   CALCIUM  9.1 06/11/2013 0404   GFRNONAA 60 (L) 01/08/2023 1133   GFRNONAA >60 06/11/2013 0404   GFRAA >60 08/07/2020 0542   GFRAA >60 06/11/2013 0404    COAGS: Lab Results  Component Value Date   INR 1.2 01/08/2023   INR 1.4 (H) 12/15/2022   INR 1.5 (H) 05/05/2021     Non-Invasive Vascular Imaging:   EXAM: CT ANGIOGRAPHY CHEST WITH CONTRAST  IMPRESSION: 1. Extensive thrombus in the pulmonary arteries, as proximal as the main pulmonary artery bifurcation, extending into the right and left pulmonary artery and their branch vessels. Thrombus is seen in arteries of each pulmonary lobe. RV/LV ratio of 2.05, concerning for right heart strain. 2. Centrilobular emphysema. 3. Coronary artery disease.  Positive for acute PE with CT evidence of right heart strain (RV/LV Ratio = 2.05) consistent with at least submassive (intermediate risk) PE.   Statin:  Yes.   Beta Blocker:  No. Aspirin:  No. ACEI:  No. ARB:  No. CCB use:  No Other antiplatelets/anticoagulants:  No.    ASSESSMENT/PLAN: This is a 72 y.o. male with extensive thrombus in the pulmonary arteries, as proximal as the main pulmonary artery bifurcation, extending into the right and left pulmonary artery and their branch vessels. Thrombus is seen in arteries of each pulmonary lobe. RV/LV ratio of 2.05, concerning for right heart strain.  Plan: Pulmonary thrombectomy today with IVC filter placement.  I discussed the procedure, benefits, risks, and complications with the patient.  He states that he has had this procedure 3 to 4 years ago and completely understands.  I answered all his questions today.  He verbalizes understanding.  Patient has been n.p.o. since 6 AM this morning.  Patient currently could not afford any anticoagulation so he has not taken any in the past month.  Orders have been placed in the system patient will be taken  for pulmonary thrombectomy this afternoon.  Plan was discussed with Dr. Vilinda Flake and he is in agreement. Patient's current BUN is 17 and current creatinine is 1.29.  Patient's current PT/INR and PTT are all within normal range.  Marcie [facility] 01/08/2023 3:30 PM

## 2023-01-08 NOTE — Assessment & Plan Note (Addendum)
This is patient's secondary saddle pulmonary embolus.  Status post thrombectomy.  Needs to be on lifelong anticoagulation.  Had stopped taking anticoagulation after last PE secondary to cost.  Patient clarified that he can afford the monthly co-pay $50, but it puts him into the donut hole which makes the whole thing unaffordable.    Pharmacy help patient get enrolled so that after April 1, when enrollment begins, company will contact him.  Outpatient pharmacy will also assist patient with this process.  Starter pack for Xarelto delivered to patient's room.  Additional prescriptions for Xarelto 20 mg once a day after starter pack is finished sent to patient's regular pharmacy at [facility].  If patient enters coverage, he has instructions to call over to outpatient pharmacy for free drug assistance.

## 2023-01-08 NOTE — Consult Note (Signed)
B[place] for heparin Indication: pulmonary embolus  Allergies  Allergen Reactions   Morphine Anxiety and Other (See Comments)    Hallucinations  Pt states 6.17.22 that he is not allergic to this medication   Azithromycin Itching    Localized pruritus on arm with IV   Morphine And Related Anxiety    Patient Measurements: Height: 6\' 1"  (185.4 cm) Weight: (!) 154.2 kg (340 lb) IBW/kg (Calculated) : 79.9 Heparin Dosing Weight: 116.2 kg  Vital Signs: Temp: 97.8 F (36.6 C) (01/30 1546) Temp Source: Oral (01/30 1546) BP: 99/61 (01/30 1800) Pulse Rate: 100 (01/30 1800)  Labs: Recent Labs    01/08/23 1133 01/08/23 1330  HGB 12.7*  --   HCT 40.9  --   PLT 169  --   APTT 31  --   LABPROT 14.7  --   INR 1.2  --   CREATININE 1.29*  --   TROPONINIHS 45* 43*     Estimated Creatinine Clearance: 82.6 mL/min (A) (by C-G formula based on SCr of 1.29 mg/dL (H)).   Medical History: Past Medical History:  Diagnosis Date   Arthritis    CHF (congestive heart failure) (HCC)    COPD (chronic obstructive pulmonary disease) (HCC)    Diabetes ([place])    Hyperlipidemia    Hypertension    Kidney stone    Obesity    Tachycardia    TIA (transient ischemic attack)     Medications:  No recent PTA AC meds, pt was taking apixaban for PE.  He claims he has not taken in months d/t cost.  Not showing on dispense hx.  Assessment: 72 yo M with PMH COPD, HFpEF, h/o saddle PE, HTN, T2DM presents with respiratory distress. Patient was prescribed apixaban but he has not taken in months due to cost. He presented to ED this morning with respiratory distress and was subsequently diagnosed with PE when CTA showed extensive thrombus in the pulmonary arteries as proximal as the main pulmonary artery bifurcation extending bilaterally. Patient was initiated on heparin which was subsequently paused because he underwent bilateral pulmonary thrombectomy. Pharmacy has now  been consulted to resume heparin at previous rate with no bolus and manage subsequent dosing.   Date Time aPTT/HL Rate/Comment   Baseline Labs: aPTT 31, INR 1.2, Hgb 12.7, Hct 40.9, Plts 169  Goal of Therapy:  Heparin level 0.3-0.7 units/ml Monitor platelets by anticoagulation protocol: Yes   Plan:  Start heparin infusion at previous rate of 1900 units/hr (no bolus) Check anti-Xa level in 6 hours and daily once consecutively therapeutic. Continue to monitor H&H and platelets daily while on heparin gtt.  Will M. Ouida Sills, PharmD PGY-1 Pharmacy Resident 01/08/2023 6:56 PM

## 2023-01-08 NOTE — Op Note (Signed)
[facility]  Percutaneous Study/Intervention Procedural Note   Date of Surgery: 01/08/2023,5:51 PM  Surgeon:Clotine Heiner, Dolores Lory   Pre-operative Diagnosis: Symptomatic pulmonary emboli with right heart strain and hypoxia  Post-operative diagnosis:  Same  Procedure(s) Performed:  1.  Selective injection right subsegmental pulmonary arteries; right upper lobe, middle lobe and lower lobe pulmonary arteries.  2.  Selective injection left subsegmental pulmonary arteries; left upper lobe and left lower lobe pulmonary arteries  3.  Infusion of TPA for thrombolysis  4.  Mechanical thrombectomy bilateral lobar pulmonary arteries for removal of pulmonary emboli using the Penumbra CAT 8 thrombectomy catheter.             5.  Inferior venacavogram.             6.  Placement of an infrarenal inferior vena cava filter option Elite type    Anesthesia: Conscious sedation was administered under my direct supervision by the interventional radiology RN. IV Versed plus fentanyl were utilized. Continuous ECG, pulse oximetry and blood pressure was monitored throughout the entire procedure.  Conscious sedation was administered for a total of 73 minutes.  Sheath: 11 cm 12 French Pinnacle sheath left common femoral vein antegrade  Contrast: 105 cc   Fluoroscopy Time: 19.1 minutes  Indications:  Patient presented to the hospital with hypoxia and shortness of breath. The patient is unable to get out of bed and transfer to a chair without increased dyspnea. The patient's O2 saturations off nasal cannula are in the 80%. CT angiogram demonstrated massive bilateral pulmonary emboli associated with significant right heart strain. Given the long-term sequela and the patient's symptomatic condition the risks and benefits for angiography with thrombectomy are reviewed all questions are answered, the patient agrees to proceed.  Procedure:  Thomas Simonis Lassiteris a 72 y.o. male who was identified  and appropriate procedural time out was performed.  The patient was then placed supine on the table and prepped and draped in the usual sterile fashion.  Ultrasound was used to evaluate the left common femoral vein.  It was compressible indicating it is patent .  A digital ultrasound image was acquired for the permanent record.  A micropuncture needle was used to access the left common femoral vein under direct ultrasound guidance.  A microwire was then advanced under fluoroscopic guidance followed by micro-sheath.  A 0.035 J wire was advanced without resistance and a 6 Fr sheath was placed.  Next 2 Perclose devices were deployed in the preclose fashion over the J-wire.  These were secured with a curved and a straight hemostat.  J-wire was then reintroduced and an 11 French sheath Pinnacle was advanced over the wire.  4000 units of heparin was then given and allowed to circulate.  TPA was reconstituted and delivered onto the table. A total of 12 milligrams of TPA was utilized.  4 was administered on the left side and 8 was administered on the right side. This was then allowed to dwell for 20-30 minutes.  The J-wire and pigtail catheter was advanced up to the right atrium where a bolus injection contrast was used to demonstrate the pulmonary artery outflow.  Stiff angled Glidewire was then exchanged for the J-wire and the pigtail catheter was to select the pulmonary outflow tract. Using the combination of the stiff angled Glidewire and the pigtail catheter the pulmonary outflow track was selected on the right side first.  The right main pulmonary artery was evaluated first.  A select catheter was then advanced  over the Amplatz wire into the distal right main pulmonary artery and hand-injection confirmed the thrombus.  8 mg of tPA was then injected directly into the thrombus within the right distal main pulmonary artery.  After an appropriate dwell time the Amplatz wire was reintroduced through the select  catheter and the select catheter removed.  A combination of both the penumbra CAT 12 and the penumbra cat 8 extra torque catheter was then advanced into the thrombus in the right lower lobe pulmonary artery.  Hand-injection contrast was used to verify positioning and evaluate the distal anatomy.  Mechanical aspiration was performed using the penumbra CAT 12 and CAT 8 catheter and a separator.  After multiple passes the catheter was then repositioned into the right middle lobe pulmonary artery and again hand-injection contrast was performed to verify position and evaluate the distal anatomy.  Multiple passes were made using mechanical aspiration in association with a separator.  Lastly, using a combination of the separator catheter and Glidewire the penumbra CAT 12 and CAT 8 device was negotiated into the right upper lobe pulmonary artery.  Hand-injection of contrast was used to verify positioning and evaluate the distal anatomy.  Multiple passes were then performed using the separator with the penumbra CAT 12 and CAT 8 penumbra catheter.  Once there was free flow of blood from all 3 lobar arteries the catheter was repositioned to the right main pulmonary artery.  Hand-injection contrast was then used to give an assessment of the effectiveness of thrombectomy.  Satisfied with the thrombectomy on the right, I then used the penumbra penumbra CAT 12 and CAT 8 device as well as a Glidewire and an angled catheter to select the left main pulmonary artery.  An Amplatz wire was then exchanged for the stiff angle Glidewire and the select catheter was exchanged back to the pigtail catheter.  Then with the catheter in the left pulmonary artery bolus injection contrast was utilized to demonstrate the thrombus as well as the segmental pulmonary artery vasculature. This demonstrated thrombus in the distal left main pulmonary artery extending into the left upper lobe artery as well as the left lower lobe artery and noting that  it was near occlusive.  The pigtail catheter was then advanced out so that it was positioned within the thrombus and 4 milligrams of TPA were infused directly into the clot.  After an appropriate dwell time the Amplatz wire was reintroduced through the pigtail catheter and the pigtail catheter removed.  The Penumbra CAT 12 and cat 8 extra torque catheter was then advanced into the thrombus in the left lower lobe pulmonary artery.  Hand-injection contrast was used to verify the positioning and evaluate the distal anatomy.  Mechanical aspiration was performed using the penumbra CAT 12 and CAT 8 catheter and a separator.  After multiple passes the catheter was then repositioned into the left upper lobe pulmonary artery and again hand-injection contrast was performed to verify positioning and evaluate the distal anatomy.  Multiple passes were made using mechanical aspiration in association with a separator.  Once there was free flow of blood from both lobar arteries the catheter was repositioned to the left main pulmonary artery.  Hand-injection contrast was then performed to give an assessment of the left pulmonary vasculature and the effectiveness of thrombectomy.  Pigtail catheter was then reintroduced over the Amplatz wire after removing the penumbra catheter and positioned in the pulmonary outflow tract.  A bolus injection of contrast was then used to create a final image  of the pulmonary vasculature.  This showed a marked reduction of the initial thrombus burden.  I estimate perhaps 20% on the left and 30% on the right.  Attention was then turned to the inferior vena cava where a 5 French delivery sheath for an option Elite filter was advanced over the wire and positioned at the level of L4.  Bolus injection of contrast was then utilized to image the inferior vena cava.  Contrast was refluxed into the right and left renal veins.  These were at the L1-L2 level.  The sheath was readjusted to mid L2 and an  option Elite.  Filter was deployed without difficulty.  Orientation is excellent.  Delivery sheath was then removed.  The 11 French sheath was then removed and the preclose devices secured.  Hemostasis was excellent.  A safeguard device was applied.  After review these images the catheter and sheath were removed and pressure held. There were no immediate complications.  Findings:   Right pulmonary artery: Initial images of the right main pulmonary artery demonstrated near total occlusion of the right main pulmonary artery distally extending into the right upper right middle and right lower lobe pulmonary arteries and then into the segmental branches particularly in the right lower lobe and right middle lobe.  There was almost no filling of the distal pulmonary tree.  Following thrombectomy there is now filling all the way out to the periphery of all 3 lobes.  The lobar arteries appear essentially free of thrombus.  There does appear to be some residual thrombus in the distal right main but it is less than 30% of the diameter and does not appear to be flow-limiting.  Left pulmonary: Initial images of the left main pulmonary artery demonstrate a massive amount of thrombus distally particularly in the left lower lobe.  The lower lobe thrombus appears to be nearly occlusive.  The upper lobe demonstrates thrombus in the left upper lobe pulmonary artery but more so in the segmental branches.  Distally as noted above in the left lower lobe there is almost no filling of the pulmonary vasculature distally.  Following thrombectomy there is now filling of the entire left lung.  A small amount of residual thrombus is noted in the left upper lobe and left lower lobe pulmonary arteries but this is less than 20% diameter reduction and does not inhibit flow.  Summary: Successful bilateral pulmonary thrombectomy with placement of an IVC filter distally.    Disposition: Patient was taken to the recovery room in stable  condition having tolerated the procedure well.  Belenda Cruise Roger Kettles 01/08/2023,5:51 PM

## 2023-01-08 NOTE — ED Notes (Signed)
PIV attempted by 2 by this RN, pt needs CTA, MD to place [place] IV.

## 2023-01-08 NOTE — Assessment & Plan Note (Addendum)
Patient's BNP is elevated on admission compared to prior in the setting of submassive PE with RV strain.  No significant lower extremity pitting edema interestingly.    Rechecked BNP post thrombectomy, and was elevated in the 700s, and this number is likely lower than where he truly is given his morbid obesity.  Started on IV Lasix and patient to date has diuresed over 7 L and is -4 L deficient.  He will resume his normal Lasix upon discharge

## 2023-01-08 NOTE — Assessment & Plan Note (Addendum)
Given severity of COPD, patient will need triple bronchodilators.  It appears he is on DuoNebs only at home.  - Continue DuoNebs as needed - Start [employer] and [employer]

## 2023-01-08 NOTE — Consult Note (Signed)
[place] for heparin Indication: pulmonary embolus  Allergies  Allergen Reactions   Morphine Anxiety and Other (See Comments)    Hallucinations  Pt states 6.17.22 that he is not allergic to this medication   Azithromycin Itching    Localized pruritus on arm with IV   Morphine And Related Anxiety    Patient Measurements: Height: 6\' 1"  (185.4 cm) Weight: (!) 154.2 kg (340 lb) IBW/kg (Calculated) : 79.9 Heparin Dosing Weight: 116.2 kg  Vital Signs: Temp Source: Oral (01/30 1137) BP: 102/72 (01/30 1400) Pulse Rate: 105 (01/30 1400)  Labs: Recent Labs    01/08/23 1133 01/08/23 1330  HGB 12.7*  --   HCT 40.9  --   PLT 169  --   CREATININE 1.29*  --   TROPONINIHS 45* 43*    Estimated Creatinine Clearance: 82.6 mL/min (A) (by C-G formula based on SCr of 1.29 mg/dL (H)).   Medical History: Past Medical History:  Diagnosis Date   Arthritis    CHF (congestive heart failure) (HCC)    COPD (chronic obstructive pulmonary disease) (HCC)    Diabetes ([place])    Hyperlipidemia    Hypertension    Kidney stone    Obesity    Tachycardia    TIA (transient ischemic attack)     Medications:  No recent PTA AC meds, pt was taking apixaban for PE.  He claims he has not taken in months d/t cost.  Not showing on dispense hx.  Assessment: Pharmacy consulted to dose heparin in this 72 yo M with a hx of PE.  Prescribed apixaban but hasn't taken in months d/t cost.  Baseline Labs: aPTT 31, INR 1.2, Hgb 12.7, Hct 40.9, Plts 169 CrCl 82.6    Goal of Therapy:  Heparin level 0.3-0.7 units/ml Monitor platelets by anticoagulation protocol: Yes   Plan:  Give 7000 units bolus x 1 Start heparin infusion at 1900 units/hr Check anti-Xa level in 6 hours and daily while on heparin Continue to monitor H&H and platelets  Alison Murray 01/08/2023,2:34 PM

## 2023-01-08 NOTE — Assessment & Plan Note (Addendum)
Secondary to decompensated heart failure.  With diuresis, by day of discharge, creatinine at 1.14 with normal GFR

## 2023-01-08 NOTE — H&P (Signed)
History and Physical    Patient: Thomas Tanner JQB:[id number] DOB: [birth year] DOA: 01/08/2023 DOS: the patient was seen and examined on 01/08/2023 PCP: Jonetta Osgood, NP  Patient coming from: Home  Chief Complaint:  Chief Complaint  Patient presents with   Respiratory Distress   HPI: Thomas Tanner is a 72 y.o. male with medical history significant of COPD with chronic hypoxic respiratory failure on 3 L at home, HFpEF, history of saddle PE not on AC, hypertension, type 2 diabetes, morbid obesity, Fournier's gangrene who presents to the ED due to difficulty breathing.  Thomas Tanner states that for the past 3 days, every time he has a bowel movement or strains, he has sudden shortness of breath that has been difficult to recover from.  For the past couple days, he does eventually recover with rest, but today he could not.  He also endorses worsening cough.  He denies any chest pain or pressure.  He believes his legs have been swelling.   Thomas Tanner was recently admitted from 1/06-1/12 for a COPD exacerbation with community-acquired pneumonia.  It appears at that time, that he stated he had been off Eliquis since August 2023 as his treatment was only supposed to be for 1 year.  Per chart review, it appears PCP discontinued apixaban in August 2023.  ED course: On arrival to the ED, patient was normotensive at 114/101 with heart rate of 110.  He was saturating at 96% on 5 L of supplemental oxygen. VBG was obtained that demonstrated pCO2 of 51 and pH of 7.42.  Initial troponin elevated at 45 with flat trend to 43.  BNP elevated at 797, previously around 200.  Lactic acid elevated at 4.0.  CTA was obtained that demonstrated extensive thrombus in the pulmonary arteries as proximal as the main pulmonary artery bifurcation extending bilaterally.  RV-LV ratio of 2.0.  Heparin infusion ordered.  TRH contacted for admission.  Review of Systems: As mentioned in the history of present  illness. All other systems reviewed and are negative.  Past Medical History:  Diagnosis Date   Arthritis    CHF (congestive heart failure) (HCC)    COPD (chronic obstructive pulmonary disease) (HCC)    Diabetes ([place])    Hyperlipidemia    Hypertension    Kidney stone    Obesity    Tachycardia    TIA (transient ischemic attack)    Past Surgical History:  Procedure Laterality Date   CORONARY/GRAFT ACUTE MI REVASCULARIZATION N/A 12/08/2018   Procedure: Coronary/Graft Acute MI Revascularization;  Surgeon: Yolonda Kida, MD;  Location: [place] [facility];  Service: Cardiovascular;  Laterality: N/A;   INCISION AND DRAINAGE ABSCESS N/A 05/18/2021   Procedure: INCISION AND DRAINAGE SCROTAL ABSCESS, cystoscopy with urethral dilation, and complex catheter placement;  Surgeon: Billey Co, MD;  Location: [facility];  Service: Urology;  Laterality: N/A;   IRRIGATION AND DEBRIDEMENT ABSCESS N/A 05/20/2021   Procedure: IRRIGATION AND DEBRIDEMENT ABSCESS;  Surgeon: Abbie Sons, MD;  Location: [facility];  Service: Urology;  Laterality: N/A;   IRRIGATION AND DEBRIDEMENT ABSCESS N/A 05/21/2021   Procedure: IRRIGATION AND DEBRIDEMENT ABSCESS;  Surgeon: Abbie Sons, MD;  Location: [facility];  Service: Urology;  Laterality: N/A;   KIDNEY STONE SURGERY     left arm surgery  1963   LEFT HEART CATH AND CORONARY ANGIOGRAPHY N/A 12/08/2018   Procedure: LEFT HEART CATH AND CORONARY ANGIOGRAPHY;  Surgeon: Yolonda Kida, MD;  Location: [facility];  Service: Cardiovascular;  Laterality: N/A;   PULMONARY THROMBECTOMY N/A 08/03/2020   Procedure: PULMONARY THROMBECTOMY / THROMBOLYSIS;  Surgeon: Algernon Huxley, MD;  Location: [place];  Service: Cardiovascular;  Laterality: N/A;   RECTAL EXAM UNDER ANESTHESIA N/A 05/20/2021   Procedure: EXAM UNDER ANESTHESIA-Dressing and Change;  Surgeon: Abbie Sons, MD;  Location: [facility];  Service: Urology;  Laterality: N/A;   Social  History:  reports that he has been smoking cigarettes. He has been smoking an average of .5 packs per day. He has been exposed to tobacco smoke. He has never used smokeless tobacco. He reports that he does not drink alcohol and does not use drugs.  Allergies  Allergen Reactions   Morphine Anxiety and Other (See Comments)    Hallucinations  Pt states 6.17.22 that he is not allergic to this medication   Azithromycin Itching    Localized pruritus on arm with IV   Morphine And Related Anxiety    Family History  Problem Relation Age of Onset   Alzheimer's disease Mother    CAD Father     Prior to Admission medications   Medication Sig Start Date End Date Taking? Authorizing Provider  albuterol (VENTOLIN HFA) 108 (90 Base) MCG/ACT inhaler Inhale 2 puffs into the lungs every 6 (six) hours as needed for wheezing or shortness of breath. 07/17/22  Yes Abernathy, Yetta Flock, NP  ALPRAZolam (XANAX) 0.25 MG tablet Take 1 tablet (0.25 mg total) by mouth daily as needed (severe anxiety). May take 1 additional dose after 2 hours if the first dose is not effective. 12/25/22  Yes Abernathy, Yetta Flock, NP  famotidine (PEPCID) 20 MG tablet Take 1 tablet (20 mg total) by mouth 2 (two) times daily. 07/17/22  Yes Abernathy, Yetta Flock, NP  furosemide (LASIX) 40 MG tablet Take 1 tablet (40 mg total) by mouth daily. 12/21/22 12/21/23 Yes Vasireddy, Grier Mitts, MD  hydrOXYzine (ATARAX) 25 MG tablet Take 1 tablet (25 mg total) by mouth 3 (three) times daily as needed for itching. 12/25/22  Yes Abernathy, Alyssa, NP  ipratropium-albuterol (DUONEB) 0.5-2.5 (3) MG/3ML SOLN Take 3 mLs by nebulization every 6 (six) hours as needed. 01/07/23  Yes Abernathy, Yetta Flock, NP  iron polysaccharides (NIFEREX) 150 MG capsule Take 1 capsule (150 mg total) by mouth daily. 05/27/21  Yes Sharen Hones, MD  lansoprazole (PREVACID) 30 MG capsule Take 1 capsule (30 mg total) by mouth daily before breakfast. 07/17/22  Yes Abernathy, Alyssa, NP  meloxicam (MOBIC)  7.5 MG tablet Take 1 tablet (7.5 mg total) by mouth 2 (two) times daily. 09/18/22  Yes Abernathy, Yetta Flock, NP  metoprolol succinate (TOPROL-XL) 50 MG 24 hr tablet Take 1 tablet (50 mg total) by mouth daily. Take with or immediately following a meal. 07/17/22  Yes Abernathy, Alyssa, NP  phenylephrine-shark liver oil-mineral oil-petrolatum (PREPARATION H) 0.25-14-74.9 % rectal ointment Place 1 application rectally 2 (two) times daily as needed for hemorrhoids. 05/16/21  Yes Sreenath, Sudheer B, MD  rosuvastatin (CRESTOR) 20 MG tablet Take 1 tablet (20 mg total) by mouth daily. 07/17/22  Yes Abernathy, Alyssa, NP  senna-docusate (SENOKOT-S) 8.6-50 MG tablet Take 1 tablet by mouth 2 (two) times daily. 05/16/21  Yes Sreenath, Sudheer B, MD  vitamin B-12 1000 MCG tablet Take 1 tablet (1,000 mcg total) by mouth daily. 05/27/21  Yes Sharen Hones, MD  OXYGEN Inhale 2.5 L into the lungs. [place] PATIENT    [provider]    Physical Exam: Vitals:  01/08/23 1239 01/08/23 1400 01/08/23 1500 01/08/23 1546  BP: 107/70 102/72 99/72 110/68  Pulse: (!) 108 (!) 105 (!) 106 (!) 112  Resp: 20 20 (!) 35 (!) 28  Temp:    97.8 F (36.6 C)  TempSrc:    Oral  SpO2: 94% 91% 96% 90%  Weight:      Height:       Physical Exam Vitals and nursing note reviewed.  Constitutional:      Appearance: He is obese. He is ill-appearing.  HENT:     Head: Normocephalic and atraumatic.     Mouth/Throat:     Mouth: Mucous membranes are moist.     Pharynx: Oropharynx is clear.  Eyes:     Conjunctiva/sclera: Conjunctivae normal.     Pupils: Pupils are equal, round, and reactive to light.  Cardiovascular:     Rate and Rhythm: Regular rhythm. Tachycardia present.     Heart sounds: No murmur heard. Pulmonary:     Effort: Tachypnea present.     Breath sounds: Decreased breath sounds (Left more than right) and wheezing (Mild, expiratory wheezing heard throughout) present. No rhonchi or  rales.  Abdominal:     General: Bowel sounds are normal. There is no distension.     Palpations: Abdomen is soft.     Tenderness: There is no abdominal tenderness. There is no guarding.  Skin:    General: Skin is warm and dry.     Comments: Bilateral lower extremity chronic venous changes noted.  Right calf appears notably larger than left.  Neurological:     General: No focal deficit present.     Mental Status: He is alert and oriented to person, place, and time. Mental status is at baseline.  Psychiatric:        Mood and Affect: Mood normal.        Behavior: Behavior normal.    Data Reviewed: CBC with WBC of 8.7, hemoglobin of 12.7, platelets 169 BMP with sodium of 134, potassium 4.2, bicarb of 27, glucose of 182, creatinine 1.29, anion gap of 9 and GFR of 60 BNP elevated at 797 Initial troponin elevated at 45 with flat trend to 43 Lactic acid elevated at 4.0 with slight decrease to 3.6 INR within normal limits at 1.2 PTT within normal limits at 31   EKG personally reviewed.  Sinus tachycardia with rate of 102.  First-degree AV block noted.  Right bundle branch block noted.  T wave inversion in leads V1 through V3.  Compared to EKG obtained on 12/15/2022, T wave inversions in leads V2 and V3 are new.  CT Angio Chest PE W/Cm &/Or Wo Cm  Result Date: 01/08/2023 CLINICAL DATA:  Shortness of breath EXAM: CT ANGIOGRAPHY CHEST WITH CONTRAST TECHNIQUE: Multidetector CT imaging of the chest was performed using the standard protocol during bolus administration of intravenous contrast. Multiplanar CT image reconstructions and MIPs were obtained to evaluate the vascular anatomy. RADIATION DOSE REDUCTION: This exam was performed according to the departmental dose-optimization program which includes automated exposure control, adjustment of the mA and/or kV according to patient size and/or use of iterative reconstruction technique. CONTRAST:  75mL OMNIPAQUE IOHEXOL 350 MG/ML SOLN COMPARISON:   05/05/2021 CTA chest FINDINGS: Cardiovascular: Extensive thrombus in the pulmonary arteries, as proximal as the main pulmonary artery bifurcation (series 6, image 166), extending into the right and left main pulmonary arteries and their branch vessels. Thrombus is seen in arteries of each pulmonary lobe. RV/LV ratio of 2.05, concerning for right heart  strain. Normal cardiac size. Coronary artery calcifications. No pericardial effusion. Mediastinum/Nodes: No enlarged mediastinal, hilar, or axillary lymph nodes. Redemonstrated heterogeneous left thyroid lobe, incompletely imaged. Lungs/Pleura: No focal pulmonary opacity or pleural effusion. Centrilobular emphysema. Upper Abdomen: No acute abnormality. Musculoskeletal: No chest wall abnormality. No acute or significant osseous findings. Review of the MIP images confirms the above findings. IMPRESSION: 1. Extensive thrombus in the pulmonary arteries, as proximal as the main pulmonary artery bifurcation, extending into the right and left pulmonary artery and their branch vessels. Thrombus is seen in arteries of each pulmonary lobe. RV/LV ratio of 2.05, concerning for right heart strain. 2. Centrilobular emphysema. 3. Coronary artery disease. Emphysema (ICD10-J43.9). Positive for acute PE with CT evidence of right heart strain (RV/LV Ratio = 2.05) consistent with at least submassive (intermediate risk) PE. The presence of right heart strain has been associated with an increased risk of morbidity and mortality. Please refer to the "Code PE Focused" order set in EPIC. These findings were discussed by telephone on 01/08/2023 at 2:01 pm with provider [facility] . Electronically Signed   By: Wiliam Ke M.D.   On: 01/08/2023 14:03   DG Chest Portable 1 View  Result Date: 01/08/2023 CLINICAL DATA:  Shortness of breath EXAM: PORTABLE CHEST 1 VIEW COMPARISON:  None Available. FINDINGS: The heart size and mediastinal contours are within normal limits. Both lungs are clear.  The visualized skeletal structures are unremarkable. IMPRESSION: No active disease. Electronically Signed   By: Lorenza Cambridge M.D.   On: 01/08/2023 12:19    There are no new results to review at this time.  Assessment and Plan:  * Pulmonary embolism [facility]) Patient presenting with shortness of breath and cough found to have extensive thrombus throughout bilateral pulmonary arteries extending into the main pulmonary artery bifurcation with evidence of RV strain with elevated BNP and troponin.  Vascular surgery consulted and patient taken emergently for thrombectomy.  - Vascular surgery consulted; appreciate their recommendations - Currently undergoing thrombectomy - Continue heparin infusion per pharmacy dosing - Continue supplemental oxygen to maintain oxygen saturation above 88% - Low threshold to consult ICU for pressor support in the case of hypotension - Patient will need lifelong anticoagulation given this is his second submassive PE.  Will consult TOC to help with Eliquis versus Xarelto pricing  AKI (acute kidney injury) (HCC) Creatinine elevated at 1.29 today compared to 0.99 approximately 2 weeks ago.  Potentially secondary to PE due to RV strain  - Hold home nephrotoxic agents - Repeat BMP in the a.m. - Bladder scan when able  COPD (chronic obstructive pulmonary disease) (HCC) Given severity of COPD, patient will need triple bronchodilators.  It appears he is on DuoNebs only at home.  - Continue DuoNebs as needed - Start Dulera and Incruse  Chronic diastolic CHF (congestive heart failure) (HCC) Patient's BNP is elevated today compared to prior in the setting of submassive PE with RV strain.  No significant lower extremity pitting edema interestingly.   - Consider Lasix post thrombectomy - Strict in and out - Daily weights - Hold home metoprolol as uncertain if patient has taken in the last few weeks  Advance Care Planning:   Code Status: Full Code verified by patient.  He  states he would want everything done in the case of cardiac or pulmonary arrest.  Consults: Vascular surgery  Family Communication: No family at bedside  Severity of Illness: The appropriate patient status for this patient is INPATIENT. Inpatient status is judged to be  reasonable and necessary in order to provide the required intensity of service to ensure the patient's safety. The patient's presenting symptoms, physical exam findings, and initial radiographic and laboratory data in the context of their chronic comorbidities is felt to place them at high risk for further clinical deterioration. Furthermore, it is not anticipated that the patient will be medically stable for discharge from the hospital within 2 midnights of admission.   * I certify that at the point of admission it is my clinical judgment that the patient will require inpatient hospital care spanning beyond 2 midnights from the point of admission due to high intensity of service, high risk for further deterioration and high frequency of surveillance required.*  Author: Jose Persia, MD 01/08/2023 4:26 PM  For on call review [email or web address].

## 2023-01-08 NOTE — Progress Notes (Signed)
During patient positioning for skin prep for pulmonary thrombectomy, patient noted to have a skin tear, present on admission and prior to arrival, in the left groin area below multiple skin folds and is ~3cm x 0.5cm. Area cleaned/prepped for procedure.  Post procedure area remain unchanged and gauze applied prior to placement of PAD. Discussed with report with Fransisco Beau, RN, ICU.

## 2023-01-09 ENCOUNTER — Other Ambulatory Visit (HOSPITAL_COMMUNITY): Payer: Self-pay

## 2023-01-09 DIAGNOSIS — I5032 Chronic diastolic (congestive) heart failure: Secondary | ICD-10-CM | POA: Diagnosis not present

## 2023-01-09 DIAGNOSIS — N179 Acute kidney failure, unspecified: Secondary | ICD-10-CM | POA: Diagnosis not present

## 2023-01-09 DIAGNOSIS — I2602 Saddle embolus of pulmonary artery with acute cor pulmonale: Secondary | ICD-10-CM | POA: Diagnosis not present

## 2023-01-09 DIAGNOSIS — J9621 Acute and chronic respiratory failure with hypoxia: Secondary | ICD-10-CM | POA: Diagnosis not present

## 2023-01-09 DIAGNOSIS — J9622 Acute and chronic respiratory failure with hypercapnia: Secondary | ICD-10-CM

## 2023-01-09 LAB — HEPARIN LEVEL (UNFRACTIONATED)
Heparin Unfractionated: 0.21 IU/mL — ABNORMAL LOW (ref 0.30–0.70)
Heparin Unfractionated: 0.56 IU/mL (ref 0.30–0.70)
Heparin Unfractionated: 0.44 IU/mL (ref 0.30–0.70)

## 2023-01-09 LAB — BLOOD CULTURE ID PANEL (REFLEXED) - BCID2

## 2023-01-09 LAB — BASIC METABOLIC PANEL
Anion gap: 11 (ref 5–15)
BUN: 20 mg/dL (ref 8–23)
CO2: 26 mmol/L (ref 22–32)
Calcium: 8.3 mg/dL — ABNORMAL LOW (ref 8.9–10.3)
Chloride: 98 mmol/L (ref 98–111)
Creatinine, Ser: 1.37 mg/dL — ABNORMAL HIGH (ref 0.61–1.24)
GFR, Estimated: 55 mL/min — ABNORMAL LOW (ref >60)
Glucose, Bld: 246 mg/dL — ABNORMAL HIGH (ref 70–99)
Potassium: 4.3 mmol/L (ref 3.5–5.1)
Sodium: 135 mmol/L (ref 135–145)

## 2023-01-09 LAB — CBC
HCT: 35.2 % — ABNORMAL LOW (ref 39.0–52.0)
Hemoglobin: 11.1 g/dL — ABNORMAL LOW (ref 13.0–17.0)
MCH: 26.2 pg (ref 26.0–34.0)
MCHC: 31.5 g/dL (ref 30.0–36.0)
MCV: 83.2 fL (ref 80.0–100.0)
Platelets: 142 10*3/uL — ABNORMAL LOW (ref 150–400)
RBC: 4.23 MIL/uL (ref 4.22–5.81)
RDW: 20.2 % — ABNORMAL HIGH (ref 11.5–15.5)
WBC: 7.3 10*3/uL (ref 4.0–10.5)
nRBC: 0 % (ref 0.0–0.2)

## 2023-01-09 LAB — GLUCOSE, CAPILLARY
Glucose-Capillary: 161 mg/dL — ABNORMAL HIGH (ref 70–99)
Glucose-Capillary: 180 mg/dL — ABNORMAL HIGH (ref 70–99)
Glucose-Capillary: 177 mg/dL — ABNORMAL HIGH (ref 70–99)
Glucose-Capillary: 175 mg/dL — ABNORMAL HIGH (ref 70–99)

## 2023-01-09 LAB — BRAIN NATRIURETIC PEPTIDE: B Natriuretic Peptide: 761.7 pg/mL — ABNORMAL HIGH (ref 0.0–100.0)

## 2023-01-09 MED ORDER — HEPARIN BOLUS VIA INFUSION
1750.0000 [IU] | Freq: Once | INTRAVENOUS | Status: AC
  Administered 2023-01-09: 1750 [IU] via INTRAVENOUS
  Filled 2023-01-09: qty 1750

## 2023-01-09 MED ORDER — WARFARIN SODIUM 5 MG PO TABS
5.0000 mg | ORAL_TABLET | Freq: Once | ORAL | Status: AC
  Administered 2023-01-09: 5 mg via ORAL
  Filled 2023-01-09: qty 1

## 2023-01-09 MED ORDER — ALPRAZOLAM 0.25 MG PO TABS
0.2500 mg | ORAL_TABLET | Freq: Every day | ORAL | Status: DC | PRN
  Administered 2023-01-09: 0.25 mg via ORAL
  Filled 2023-01-09: qty 1

## 2023-01-09 MED ORDER — GUAIFENESIN-DM 100-10 MG/5ML PO SYRP
5.0000 mL | ORAL_SOLUTION | ORAL | Status: DC | PRN
  Administered 2023-01-09: 5 mL via ORAL
  Filled 2023-01-09: qty 10

## 2023-01-09 MED ORDER — WARFARIN - PHARMACIST DOSING INPATIENT: Freq: Every day | Status: DC

## 2023-01-09 NOTE — TOC Benefit Eligibility Note (Signed)
Transition of Care [facility]) Benefit Eligibility Note    Patient Details  Name: Thomas Tanner MRN: [id number] Date of Birth: 01-Oct-1951   Medication/Dose: Eliquis 10mg  BID x 7 days followed by 5mg  BID or Xarelto 15mg  BID x 21 days followed by 20mg  daily  Covered?: Yes  Tier: 3 Drug   Spoke with Person/Company/Phone Number:: Ed with [employer] at [phone]  Co-Pay: $47 estimated cost for 30 day supply retail, $141 estimated cost 90 day supply retail.  This amount is the same for both Eliquis and Xarelto per rep as both are Tier 3 medications.  Prior Approval: No  Deductible:  (No deductible per rep)   Dannette Barbara Phone Number: [phone] 01/09/2023, 2:23 PM

## 2023-01-09 NOTE — Progress Notes (Signed)
PHARMACY - PHYSICIAN COMMUNICATION CRITICAL VALUE ALERT - BLOOD CULTURE IDENTIFICATION (BCID)  Thomas Tanner is an 72 y.o. male who presented to [facility] on 01/08/2023 with a chief complaint of respiratory distress  Assessment:  1/4 (anaerobic bottle) GPC: MRSE,   Name of physician (or Provider) Contacted: Neomia Glass, NP  Current antibiotics: none  Changes to prescribed antibiotics recommended: Suspected contaminant, recommend withholding antibiotic at this time Recommendations accepted by provider  Results for orders placed or performed during the hospital encounter of 01/08/23  Blood Culture ID Panel (Reflexed) (Collected: 01/08/2023 12:33 PM)  Result Value Ref Range   Enterococcus faecalis NOT DETECTED NOT DETECTED   Enterococcus Faecium NOT DETECTED NOT DETECTED   Listeria monocytogenes NOT DETECTED NOT DETECTED   Staphylococcus species DETECTED (A) NOT DETECTED   Staphylococcus aureus (BCID) NOT DETECTED NOT DETECTED   Staphylococcus epidermidis DETECTED (A) NOT DETECTED   Staphylococcus lugdunensis NOT DETECTED NOT DETECTED   Streptococcus species NOT DETECTED NOT DETECTED   Streptococcus agalactiae NOT DETECTED NOT DETECTED   Streptococcus pneumoniae NOT DETECTED NOT DETECTED   Streptococcus pyogenes NOT DETECTED NOT DETECTED   A.calcoaceticus-baumannii NOT DETECTED NOT DETECTED   Bacteroides fragilis NOT DETECTED NOT DETECTED   Enterobacterales NOT DETECTED NOT DETECTED   Enterobacter cloacae complex NOT DETECTED NOT DETECTED   Escherichia coli NOT DETECTED NOT DETECTED   Klebsiella aerogenes NOT DETECTED NOT DETECTED   Klebsiella oxytoca NOT DETECTED NOT DETECTED   Klebsiella pneumoniae NOT DETECTED NOT DETECTED   Proteus species NOT DETECTED NOT DETECTED   Salmonella species NOT DETECTED NOT DETECTED   Serratia marcescens NOT DETECTED NOT DETECTED   Haemophilus influenzae NOT DETECTED NOT DETECTED   Neisseria meningitidis NOT DETECTED NOT DETECTED    Pseudomonas aeruginosa NOT DETECTED NOT DETECTED   Stenotrophomonas maltophilia NOT DETECTED NOT DETECTED   Candida albicans NOT DETECTED NOT DETECTED   Candida auris NOT DETECTED NOT DETECTED   Candida glabrata NOT DETECTED NOT DETECTED   Candida krusei NOT DETECTED NOT DETECTED   Candida parapsilosis NOT DETECTED NOT DETECTED   Candida tropicalis NOT DETECTED NOT DETECTED   Cryptococcus neoformans/gattii NOT DETECTED NOT DETECTED   Methicillin resistance mecA/C DETECTED (A) NOT DETECTED    Dorothe Pea 01/09/2023  5:58 AM

## 2023-01-09 NOTE — Consult Note (Signed)
ANTICOAGULATION CONSULT NOTE Pharmacy Consult for heparin Indication: pulmonary embolus  Allergies  Allergen Reactions   Morphine Anxiety and Other (See Comments)    Hallucinations  Pt states 6.17.22 that he is not allergic to this medication   Azithromycin Itching    Localized pruritus on arm with IV   Morphine And Related Anxiety    Patient Measurements: Height: 6\' 1"  (185.4 cm) Weight: (!) 154.2 kg (340 lb) IBW/kg (Calculated) : 79.9 Heparin Dosing Weight: 116.2 kg  Vital Signs: Temp: 98 F (36.7 C) (01/31 1600) Temp Source: Oral (01/31 1600) BP: 106/68 (01/31 1600) Pulse Rate: 94 (01/31 1600)  Labs: Recent Labs    01/08/23 1133 01/08/23 1330 01/08/23 2058 01/09/23 0059 01/09/23 0951 01/09/23 1525  HGB 12.7*  --   --  11.1*  --   --   HCT 40.9  --   --  35.2*  --   --   PLT 169  --   --  142*  --   --   APTT 31  --   --   --   --   --   LABPROT 14.7  --   --   --   --   --   INR 1.2  --   --   --   --   --   HEPARINUNFRC  --   --    < > 0.21* 0.56 0.44  CREATININE 1.29*  --   --  1.37*  --   --   TROPONINIHS 45* 43*  --   --   --   --    < > = values in this interval not displayed.     Estimated Creatinine Clearance: 77.8 mL/min (A) (by C-G formula based on SCr of 1.37 mg/dL (H)).   Medical History: Past Medical History:  Diagnosis Date   Arthritis    CHF (congestive heart failure) (HCC)    COPD (chronic obstructive pulmonary disease) (HCC)    Diabetes ([place])    Hyperlipidemia    Hypertension    Kidney stone    Obesity    Tachycardia    TIA (transient ischemic attack)     Medications:  No recent PTA AC meds, pt was taking apixaban for PE.  He claims he has not taken in months d/t cost.  Not showing on dispense hx.  Assessment: 72 yo M with PMH COPD, HFpEF, h/o saddle PE, HTN, T2DM presents with respiratory distress. Patient was prescribed apixaban but he has not taken in months due to cost. He presented to ED this morning with respiratory  distress and was subsequently diagnosed with PE when CTA showed extensive thrombus in the pulmonary arteries as proximal as the main pulmonary artery bifurcation extending bilaterally. Patient was initiated on heparin which was subsequently paused because he underwent bilateral pulmonary thrombectomy. Pharmacy has now been consulted to resume heparin at previous rate with no bolus and manage subsequent dosing.   Date Time aPTT/HL Rate/Comment 01/31 0059 HL 0.21 Subtherapeutic 1900 > 2150 units/hr 01/31 0951 HL 0.56 Therapeutic@2150  units/hr 01/31 1525 HL 0.44 Therapeutic x 2@2150  units/hr  Baseline Labs: aPTT 31, INR 1.2, Hgb 12.7, Hct 40.9, Plts 169  Goal of Therapy:  Heparin level 0.3-0.7 units/ml Monitor platelets by anticoagulation protocol: Yes   Plan:  Heparin level therapeutic x2. Continue heparin infusion at 2150 units/hr.  Recheck anti-Xa level with AM labs Start warfarin 5 mg x 1 along with heparin infusion.  Will plan to continue both warfarin and IV  heparin for at least five days with INR at goal for at least 24 hours INR tomorrow AM Continue to monitor H&H and platelets daily  Pearla Dubonnet, PharmD Clinical Pharmacist   01/09/2023 5:11 PM

## 2023-01-09 NOTE — Assessment & Plan Note (Signed)
Meets criteria with BMI greater than 40

## 2023-01-09 NOTE — TOC Benefit Eligibility Note (Signed)
Patient Teacher, English as a foreign language completed.    The patient is currently admitted and upon discharge could be taking Eliquis Starter Pack.  The current 30 day co-pay is $47.00.   The patient is currently admitted and upon discharge could be taking Xarelto Starter Pack.  The current 30 day co-pay is $47.00.   The patient is insured through [place], [place] Patient Advocate Specialist [place] Patient Advocate Team Direct Number: [phone]  Fax: [phone]

## 2023-01-09 NOTE — Assessment & Plan Note (Addendum)
Baseline on 3.5 L.  Acute respiratory failure secondary to PE.  Status post thrombectomy, oxygenation improving.  Also with acute diastolic heart failure.  See below.  Diuresing, and by day of discharge, down to baseline

## 2023-01-09 NOTE — Hospital Course (Signed)
72 year old male with past medical history of COPD with chronic respiratory failure on 3.5 L nasal cannula, diastolic heart failure, previous saddle PE who had stopped his anticoagulation due to affordability as well as morbid obesity who presented to the emergency room on 1/30 with complaints of 3 days of shortness of breath with any kind of activity, difficult to recover from.  (Patient had a recent hospitalization for 6 days, discharged on 1/12, for COPD and pneumonia.) Patient found to be acutely hypoxic requiring 5 L nasal cannula, BNP of 797 and new extensive thrombus with evidence of right ventricular strain.  Vascular surgery was consulted and patient underwent thrombectomy.  Post procedure, patient's oxygenation improving and vascular surgery recommending lifelong anticoagulation.

## 2023-01-09 NOTE — TOC Initial Note (Signed)
Transition of Care [facility]) - Initial/Assessment Note    Patient Details  Name: Thomas Tanner MRN: [id number] Date of Birth: April 08, 1951  Transition of Care [facility]) CM/SW Contact:    Shelbie Hutching, RN Phone Number: 01/09/2023, 4:53 PM  Clinical Narrative:                 Patient admitted to the hospital for Pulmonary embolism.  Patient is from home with spouse.  Patient recently discharged from the hospital 3 weeks ago with home health services through [facility].  Kelsey aware of patient in the hospital and she says that they can add RN to the orders at DC.    Patient was saying that his medication Eliquis was too expensive so he stopped taking it.  Pharmacy did a price check and with his insurance the Co pay is $47, Co pay for Xarelto is also $47.   Plan is for patient to be switched to coumadin, which is much cheaper.   Expected Discharge Plan: [place] Barriers to Discharge: Continued Medical Work up   Patient Goals and CMS Choice   CMS Medicare.gov Compare Post Acute Care list provided to:: Patient Choice offered to / list presented to : Patient      Expected Discharge Plan and Services   Discharge Planning Services: CM Consult, Medication Assistance Post Acute Care Choice: [place] arrangements for the past 2 months: Single Family Home                           HH Arranged: PT, OT, RN [facility] Agency: [facility] Date [facility] Agency Contacted: 01/09/23 Time [place]: 1652 Representative spoke with at [place]: [place] Arrangements/Services Living arrangements for the past 2 months: [place] with:: Spouse, Adult Children Patient language and need for interpreter reviewed:: Yes        Need for Family Participation in Patient Care: Yes (Comment) Care giver support system in place?: Yes (comment) Current home services: DME (oxygen) Criminal Activity/Legal Involvement Pertinent to Current  Situation/Hospitalization: No - Comment as needed  Activities of Daily Living      Permission Sought/Granted                  Emotional Assessment Appearance:: Appears stated age     Orientation: : Oriented to Self, Oriented to Situation, Oriented to Place, Oriented to  Time Alcohol / Substance Use: Not Applicable Psych Involvement: No (comment)  Admission diagnosis:  Pulmonary embolism (HCC) [I26.99] Acute respiratory failure with hypoxia ([place]) [J96.01] Acute saddle pulmonary embolism with acute cor pulmonale (HCC) [I26.02] Patient Active Problem List   Diagnosis Date Noted   Pulmonary embolism ([place]) 01/08/2023   Diabetes ([place]) 12/16/2022   Chronic anticoagulation 12/16/2022   History of saddle pulmonary embolus (PE) 12/16/2022   Hyperlipidemia 11/08/2021   Tobacco abuse 11/08/2021   History of Fournier's gangrene 11/08/2021   Open wound of scrotum 11/08/2021   Scrotal bleeding 06/10/2021   Abscess 05/18/2021   Fournier's gangrene of scrotum 05/18/2021   Fournier gangrene 05/18/2021   Functional quadriplegia ([place]) 05/18/2021   Bacteremia due to Gram-negative bacteria 05/07/2021   Constipated 05/06/2021   Physical deconditioning 05/06/2021   Atherosclerosis of aorta ([place]) 09/28/2020   Acute on chronic heart failure ([place]) 08/02/2020   Acute saddle pulmonary embolism ([place]) 08/02/2020   Hypoxia 08/01/2020   Weakness 08/01/2020   Abnormality of gait and mobility  08/01/2020   AKI (acute kidney injury) ([place]) 07/21/2020   Hypotension 07/20/2020   Leukocytosis 07/20/2020   Cellulitis of right lower extremity 07/20/2020   Pressure injury of skin 07/12/2020   Acute on chronic respiratory failure with hypoxia and hypercapnia ([place]) 07/10/2020   COPD (chronic obstructive pulmonary disease) (HCC)    Elevated troponin    Chronic diastolic CHF (congestive heart failure) ([place]) 05/24/2020   Anasarca    Acute renal failure superimposed on stage 3a chronic kidney disease ([place])     Shortness of breath 04/28/2020   Upper respiratory tract infection due to COVID-19 virus 04/28/2020   Edema 04/28/2020   Encounter for general adult medical examination with abnormal findings 01/15/2019   Acute non-recurrent maxillary sinusitis 01/15/2019   Gastroesophageal reflux disease without esophagitis 01/15/2019   CAD in native artery 12/16/2018   Morbid obesity ([place]) 12/16/2018   Cigarette smoker motivated to quit 12/16/2018   Chronic pain of right knee 08/04/2018   Mild intermittent asthma 08/04/2018   Impaired fasting glucose 04/03/2018   Low back pain with right-sided sciatica 03/05/2018   Chronic right-sided low back pain with right-sided sciatica 03/05/2018   Pain in right hip 03/05/2018   Hypertension 03/05/2018   Foreign body in bladder and urethra 06/09/2013   Kidney stone 06/09/2013   Ureteric stone 06/09/2013   Kidney stone 06/09/2013   PCP:  Jonetta Osgood, NP Pharmacy:   [facility] [address] ([address]), [place] [address] [place]) [place] [address] Phone: [phone] Fax: [phone]  [facility] ([place], [place] [facility] [address] [place] [address] Phone: [phone] Fax: [phone]     Social Determinants of Health (SDOH) Social History: SDOH Screenings   Food Insecurity: No Food Insecurity (12/17/2022)  Housing: Low Risk  (12/17/2022)  Transportation Needs: No Transportation Needs (12/17/2022)  Utilities: Not At Risk (12/17/2022)  Alcohol Screen: Low Risk  (10/02/2021)  Depression (PHQ2-9): Low Risk  (10/02/2021)  Physical Activity: Unknown (12/08/2018)  Social Connections: Moderately Integrated (12/08/2018)  Stress: No Stress Concern Present (12/08/2018)  Tobacco Use: High Risk (01/08/2023)   SDOH Interventions:     Readmission Risk Interventions     No data to display

## 2023-01-09 NOTE — Consult Note (Signed)
ANTICOAGULATION CONSULT NOTE Pharmacy Consult for heparin Indication: pulmonary embolus  Allergies  Allergen Reactions   Morphine Anxiety and Other (See Comments)    Hallucinations  Pt states 6.17.22 that he is not allergic to this medication   Azithromycin Itching    Localized pruritus on arm with IV   Morphine And Related Anxiety    Patient Measurements: Height: 6\' 1"  (185.4 cm) Weight: (!) 154.2 kg (340 lb) IBW/kg (Calculated) : 79.9 Heparin Dosing Weight: 116.2 kg  Vital Signs: Temp: 97.3 F (36.3 C) (01/31 1242) Temp Source: Oral (01/31 1242) BP: 111/69 (01/31 1242) Pulse Rate: 48 (01/31 1242)  Labs: Recent Labs    01/08/23 1133 01/08/23 1330 01/08/23 2058 01/09/23 0059 01/09/23 0951  HGB 12.7*  --   --  11.1*  --   HCT 40.9  --   --  35.2*  --   PLT 169  --   --  142*  --   APTT 31  --   --   --   --   LABPROT 14.7  --   --   --   --   INR 1.2  --   --   --   --   HEPARINUNFRC  --   --  0.20* 0.21* 0.56  CREATININE 1.29*  --   --  1.37*  --   TROPONINIHS 45* 43*  --   --   --      Estimated Creatinine Clearance: 77.8 mL/min (A) (by C-G formula based on SCr of 1.37 mg/dL (H)).   Medical History: Past Medical History:  Diagnosis Date   Arthritis    CHF (congestive heart failure) (HCC)    COPD (chronic obstructive pulmonary disease) (HCC)    Diabetes ([place])    Hyperlipidemia    Hypertension    Kidney stone    Obesity    Tachycardia    TIA (transient ischemic attack)     Medications:  No recent PTA AC meds, pt was taking apixaban for PE.  He claims he has not taken in months d/t cost.  Not showing on dispense hx.  Assessment: 72 yo M with PMH COPD, HFpEF, h/o saddle PE, HTN, T2DM presents with respiratory distress. Patient was prescribed apixaban but he has not taken in months due to cost. He presented to ED this morning with respiratory distress and was subsequently diagnosed with PE when CTA showed extensive thrombus in the pulmonary arteries  as proximal as the main pulmonary artery bifurcation extending bilaterally. Patient was initiated on heparin which was subsequently paused because he underwent bilateral pulmonary thrombectomy. Pharmacy has now been consulted to resume heparin at previous rate with no bolus and manage subsequent dosing.   Date Time aPTT/HL Rate/Comment 01/31 0059 HL 0.21 Subtherapeutic 1900 > 2150 units/hr 01/31 0951 HL 0.56 Therapeutic 2150 units/hr  Baseline Labs: aPTT 31, INR 1.2, Hgb 12.7, Hct 40.9, Plts 169  Goal of Therapy:  Heparin level 0.3-0.7 units/ml Monitor platelets by anticoagulation protocol: Yes   Plan:  Heparin level therapeutic. Continue heparin infusion at 2150 units/hr.  Check anti-Xa level in 6 hours to confirm and daily once consecutively therapeutic. Start warfarin 5 mg x 1 along with heparin infusion.  Will plan to continue both warfarin and IV heparin for at least five days with INR at goal for at least 24 hours INR tomorrow AM Continue to monitor H&H and platelets daily  Wynelle Cleveland, PharmD, BCPS Clinical Pharmacist   01/09/2023 2:52 PM

## 2023-01-09 NOTE — Consult Note (Signed)
ANTICOAGULATION CONSULT NOTE Pharmacy Consult for heparin Indication: pulmonary embolus  Allergies  Allergen Reactions   Morphine Anxiety and Other (See Comments)    Hallucinations  Pt states 6.17.22 that he is not allergic to this medication   Azithromycin Itching    Localized pruritus on arm with IV   Morphine And Related Anxiety    Patient Measurements: Height: 6\' 1"  (185.4 cm) Weight: (!) 154.2 kg (340 lb) IBW/kg (Calculated) : 79.9 Heparin Dosing Weight: 116.2 kg  Vital Signs: Temp: 97.5 F (36.4 C) (01/31 0825) Temp Source: Oral (01/31 0825) BP: 92/62 (01/31 0836) Pulse Rate: 89 (01/31 0836)  Labs: Recent Labs    01/08/23 1133 01/08/23 1330 01/08/23 2058 01/09/23 0059 01/09/23 0951  HGB 12.7*  --   --  11.1*  --   HCT 40.9  --   --  35.2*  --   PLT 169  --   --  142*  --   APTT 31  --   --   --   --   LABPROT 14.7  --   --   --   --   INR 1.2  --   --   --   --   HEPARINUNFRC  --   --  0.20* 0.21* 0.56  CREATININE 1.29*  --   --  1.37*  --   TROPONINIHS 45* 43*  --   --   --      Estimated Creatinine Clearance: 77.8 mL/min (A) (by C-G formula based on SCr of 1.37 mg/dL (H)).   Medical History: Past Medical History:  Diagnosis Date   Arthritis    CHF (congestive heart failure) (HCC)    COPD (chronic obstructive pulmonary disease) (HCC)    Diabetes ([place])    Hyperlipidemia    Hypertension    Kidney stone    Obesity    Tachycardia    TIA (transient ischemic attack)     Medications:  No recent PTA AC meds, pt was taking apixaban for PE.  He claims he has not taken in months d/t cost.  Not showing on dispense hx.  Assessment: 72 yo M with PMH COPD, HFpEF, h/o saddle PE, HTN, T2DM presents with respiratory distress. Patient was prescribed apixaban but he has not taken in months due to cost. He presented to ED this morning with respiratory distress and was subsequently diagnosed with PE when CTA showed extensive thrombus in the pulmonary arteries as  proximal as the main pulmonary artery bifurcation extending bilaterally. Patient was initiated on heparin which was subsequently paused because he underwent bilateral pulmonary thrombectomy. Pharmacy has now been consulted to resume heparin at previous rate with no bolus and manage subsequent dosing.   Date Time aPTT/HL Rate/Comment 01/31 0059 HL 0.21 Subtherapeutic 1900 > 2150 units/hr 01/31 0951 HL 0.56 Therapeutic 2150 units/hr  Baseline Labs: aPTT 31, INR 1.2, Hgb 12.7, Hct 40.9, Plts 169  Goal of Therapy:  Heparin level 0.3-0.7 units/ml Monitor platelets by anticoagulation protocol: Yes   Plan: heparin level therapeutic Continue heparin infusion at 2150 units/hr Check anti-Xa level in 6 hours to confirm and daily once consecutively therapeutic. Continue to monitor H&H and platelets daily while on heparin gtt.  Wynelle Cleveland, PharmD, BCPS Clinical Pharmacist   01/09/2023 10:33 AM

## 2023-01-09 NOTE — Progress Notes (Signed)
Patient transferred to 1C room 107 by bed on tele and o2 with Gerald Stabs, NT. Patient alert with no distress noted when leaving ICU. Report called prior to Regino Schultze, RN.

## 2023-01-09 NOTE — Progress Notes (Signed)
[facility] Progress Note  Patient: Thomas Tanner    PYK:[id number]  DOA: 01/08/2023    Date of Service: the patient was seen and examined on 01/09/2023  Brief hospital course: 72 year old male with past medical history of COPD with chronic respiratory failure on 3.5 L nasal cannula, diastolic heart failure, previous saddle PE who had stopped his anticoagulation due to affordability as well as morbid obesity who presented to the emergency room on 1/30 with complaints of 3 days of shortness of breath with any kind of activity, difficult to recover from.  (Patient had a recent hospitalization for 6 days, discharged on 1/12, for COPD and pneumonia.) Patient found to be acutely hypoxic requiring 5 L nasal cannula, BNP of 797 and new extensive thrombus with evidence of right ventricular strain.  Vascular surgery was consulted and patient underwent thrombectomy.  Post procedure, patient's oxygenation improving and vascular surgery recommending lifelong anticoagulation.   Assessment and Plan: * Pulmonary embolism ([place]) This is patient's secondary saddle pulmonary embolus.  Status post thrombectomy.  Needs to be on lifelong anticoagulation.  Had stopped taking anticoagulation after last PE secondary to cost.  Hide case manager review and unfortunately, patient does not have long-term options for newer anticoagulation agents.  Starting on Coumadin.    Acute on chronic respiratory failure with hypoxia and hypercapnia (HCC) Baseline on 3.5 L.  Acute respiratory failure secondary to PE.  Status post thrombectomy, oxygenation improving.  Following up on BNP  Chronic diastolic CHF (congestive heart failure) ([place]) Patient's BNP is elevated on admission compared to prior in the setting of submassive PE with RV strain.  No significant lower extremity pitting edema interestingly.  Recheck post thrombectomy  AKI (acute kidney injury) ([place]) Checking BNP, could be secondary decompensated heart  failure, slightly worse today.  COPD (chronic obstructive pulmonary disease) (HCC) Given severity of COPD, patient will need triple bronchodilators.  It appears he is on DuoNebs only at home.  - Continue DuoNebs as needed - Start Dulera and Incruse  Morbid obesity ([place]) Meets criteria with BMI greater than 40       Body mass index is 44.86 kg/m.        Consultants: Vascular surgery  Procedures: Thrombectomy  Antimicrobials: None  Code Status: Full code   Subjective: No complaints, denies chest pain, breathing easier  Objective: Vital signs were reviewed and unremarkable. Vitals:   01/09/23 1500 01/09/23 1600  BP:  106/68  Pulse: (!) 40 94  Resp: 20 (!) 24  Temp:  98 F (36.7 C)  SpO2: 96% 96%    Intake/Output Summary (Last 24 hours) at 01/09/2023 1623 Last data filed at 01/09/2023 1600 Gross per 24 hour  Intake 662.13 ml  Output 575 ml  Net 87.13 ml   Filed Weights   01/08/23 1137  Weight: (!) 154.2 kg   Body mass index is 44.86 kg/m.  Exam:  General: Alert and oriented x 3, no acute distress HEENT: Normocephalic, atraumatic, narrow airway, neck is thick Cardiovascular: Regular rate and rhythm, S1-S2 Respiratory: Decreased breath sounds throughout secondary to body habitus Abdomen: Soft, obese, nontender, positive bowel sounds Musculoskeletal: No clubbing or cyanosis, trace pitting edema Skin: No skin breaks or tears or lesions Psychiatry: Appropriate, no evidence of psychoses Neurology: No focal deficits  Data Reviewed: Creatinine of 1.37 with GFR 55, hemoglobin of 11.1  Disposition:  Status is: Inpatient Remains inpatient appropriate because:  -Improvement in hypoxia -Follow-up on heart failure -Therapeutic on Coumadin    Anticipated discharge date: 2/3  Family Communication: Will call family DVT Prophylaxis: Heparin changed over to Coumadin    Author: Annita Brod ,MD 01/09/2023 4:23 PM  To reach On-call, see care  teams to locate the attending and reach out via [email or web address]. Between 7PM-7AM, please contact night-coverage If you still have difficulty reaching the attending provider, please page the [facility] (Director on Call) for [facility] on amion for assistance.

## 2023-01-09 NOTE — Consult Note (Signed)
[place] for heparin Indication: pulmonary embolus  Allergies  Allergen Reactions   Morphine Anxiety and Other (See Comments)    Hallucinations  Pt states 6.17.22 that he is not allergic to this medication   Azithromycin Itching    Localized pruritus on arm with IV   Morphine And Related Anxiety    Patient Measurements: Height: 6\' 1"  (185.4 cm) Weight: (!) 154.2 kg (340 lb) IBW/kg (Calculated) : 79.9 Heparin Dosing Weight: 116.2 kg  Vital Signs: Temp: 97.6 F (36.4 C) (01/31 0000) Temp Source: Oral (01/31 0000) BP: 128/72 (01/31 0217) Pulse Rate: 95 (01/31 0217)  Labs: Recent Labs    01/08/23 1133 01/08/23 1330 01/08/23 2058 01/09/23 0059  HGB 12.7*  --   --  11.1*  HCT 40.9  --   --  35.2*  PLT 169  --   --  142*  APTT 31  --   --   --   LABPROT 14.7  --   --   --   INR 1.2  --   --   --   HEPARINUNFRC  --   --  0.20* 0.21*  CREATININE 1.29*  --   --  1.37*  TROPONINIHS 45* 43*  --   --      Estimated Creatinine Clearance: 77.8 mL/min (A) (by C-G formula based on SCr of 1.37 mg/dL (H)).   Medical History: Past Medical History:  Diagnosis Date   Arthritis    CHF (congestive heart failure) (HCC)    COPD (chronic obstructive pulmonary disease) (HCC)    Diabetes ([place])    Hyperlipidemia    Hypertension    Kidney stone    Obesity    Tachycardia    TIA (transient ischemic attack)     Medications:  No recent PTA AC meds, pt was taking apixaban for PE.  He claims he has not taken in months d/t cost.  Not showing on dispense hx.  Assessment: 72 yo M with PMH COPD, HFpEF, h/o saddle PE, HTN, T2DM presents with respiratory distress. Patient was prescribed apixaban but he has not taken in months due to cost. He presented to ED this morning with respiratory distress and was subsequently diagnosed with PE when CTA showed extensive thrombus in the pulmonary arteries as proximal as the main pulmonary artery bifurcation  extending bilaterally. Patient was initiated on heparin which was subsequently paused because he underwent bilateral pulmonary thrombectomy. Pharmacy has now been consulted to resume heparin at previous rate with no bolus and manage subsequent dosing.   Date Time aPTT/HL Rate/Comment 01/31 0059 HL 0.21 Subtherapeutic 1900 > 2150 units/hr  Baseline Labs: aPTT 31, INR 1.2, Hgb 12.7, Hct 40.9, Plts 169  Goal of Therapy:  Heparin level 0.3-0.7 units/ml Monitor platelets by anticoagulation protocol: Yes   Plan:  Heparin subtherapeutic Heparin bolus of 1750 units x 1  Increase heparin infusion to 2150 units/hr Check anti-Xa level in 6 hours following rate change and daily once consecutively therapeutic. Continue to monitor H&H and platelets daily while on heparin gtt.  Dorothe Pea, PharmD, BCPS Clinical Pharmacist   01/09/2023 2:52 AM

## 2023-01-10 ENCOUNTER — Other Ambulatory Visit (HOSPITAL_COMMUNITY): Payer: Self-pay

## 2023-01-10 ENCOUNTER — Other Ambulatory Visit: Payer: Self-pay

## 2023-01-10 DIAGNOSIS — I2602 Saddle embolus of pulmonary artery with acute cor pulmonale: Secondary | ICD-10-CM | POA: Diagnosis not present

## 2023-01-10 DIAGNOSIS — N179 Acute kidney failure, unspecified: Secondary | ICD-10-CM | POA: Diagnosis not present

## 2023-01-10 DIAGNOSIS — J9621 Acute and chronic respiratory failure with hypoxia: Secondary | ICD-10-CM | POA: Diagnosis not present

## 2023-01-10 DIAGNOSIS — I5032 Chronic diastolic (congestive) heart failure: Secondary | ICD-10-CM | POA: Diagnosis not present

## 2023-01-10 LAB — CBC
HCT: 28.5 % — ABNORMAL LOW (ref 39.0–52.0)
Hemoglobin: 9.8 g/dL — ABNORMAL LOW (ref 13.0–17.0)
MCH: 29.9 pg (ref 26.0–34.0)
MCHC: 34.4 g/dL (ref 30.0–36.0)
MCV: 86.9 fL (ref 80.0–100.0)
Platelets: 152 10*3/uL (ref 150–400)
RBC: 3.28 MIL/uL — ABNORMAL LOW (ref 4.22–5.81)
RDW: 21.6 % — ABNORMAL HIGH (ref 11.5–15.5)
WBC: 9.6 10*3/uL (ref 4.0–10.5)
nRBC: 0 % (ref 0.0–0.2)

## 2023-01-10 LAB — PROTIME-INR
INR: 1.3 — ABNORMAL HIGH (ref 0.8–1.2)
Prothrombin Time: 15.7 seconds — ABNORMAL HIGH (ref 11.4–15.2)

## 2023-01-10 LAB — GLUCOSE, CAPILLARY
Glucose-Capillary: 112 mg/dL — ABNORMAL HIGH (ref 70–99)
Glucose-Capillary: 170 mg/dL — ABNORMAL HIGH (ref 70–99)
Glucose-Capillary: 153 mg/dL — ABNORMAL HIGH (ref 70–99)
Glucose-Capillary: 138 mg/dL — ABNORMAL HIGH (ref 70–99)

## 2023-01-10 LAB — HEPARIN LEVEL (UNFRACTIONATED): Heparin Unfractionated: 0.41 IU/mL (ref 0.30–0.70)

## 2023-01-10 MED ORDER — RIVAROXABAN 15 MG PO TABS
15.0000 mg | ORAL_TABLET | Freq: Two times a day (BID) | ORAL | Status: DC
  Administered 2023-01-10 – 2023-01-11 (×3): 15 mg via ORAL
  Filled 2023-01-10 (×3): qty 1

## 2023-01-10 MED ORDER — RIVAROXABAN 20 MG PO TABS: 20.0000 mg | ORAL_TABLET | Freq: Every day | ORAL | Status: DC

## 2023-01-10 MED ORDER — WARFARIN SODIUM 7.5 MG PO TABS
7.5000 mg | ORAL_TABLET | Freq: Once | ORAL | Status: DC
  Filled 2023-01-10: qty 1

## 2023-01-10 MED ORDER — RIVAROXABAN (XARELTO) VTE STARTER PACK (15 & 20 MG)
ORAL_TABLET | ORAL | 0 refills | Status: DC
  Filled 2023-01-10: qty 51, 28d supply, fill #0

## 2023-01-10 MED ORDER — FUROSEMIDE 10 MG/ML IJ SOLN
20.0000 mg | Freq: Two times a day (BID) | INTRAMUSCULAR | Status: DC
  Administered 2023-01-10 – 2023-01-11 (×3): 20 mg via INTRAVENOUS
  Filled 2023-01-10 (×3): qty 2

## 2023-01-10 NOTE — Consult Note (Signed)
ANTICOAGULATION CONSULT NOTE  Pharmacy Consult for Heparin bridge to Warfarin Indication: pulmonary embolus  Patient Measurements: Height: 6\' 1"  (185.4 cm) Weight: (!) 174.5 kg (384 lb 11.2 oz) IBW/kg (Calculated) : 79.9 Heparin Dosing Weight: 116.2 kg  Labs: Recent Labs    01/08/23 1133 01/08/23 1330 01/08/23 2058 01/09/23 0059 01/09/23 0951 01/09/23 1525 01/10/23 0353  HGB 12.7*  --   --  11.1*  --   --  9.8*  HCT 40.9  --   --  35.2*  --   --  28.5*  PLT 169  --   --  142*  --   --  152  APTT 31  --   --   --   --   --   --   LABPROT 14.7  --   --   --   --   --  15.7*  INR 1.2  --   --   --   --   --  1.3*  HEPARINUNFRC  --   --    < > 0.21* 0.56 0.44 0.41  CREATININE 1.29*  --   --  1.37*  --   --   --   TROPONINIHS 45* 43*  --   --   --   --   --    < > = values in this interval not displayed.     Estimated Creatinine Clearance: 83.5 mL/min (A) (by C-G formula based on SCr of 1.37 mg/dL (H)).   Medical History: Past Medical History:  Diagnosis Date   Arthritis    CHF (congestive heart failure) (HCC)    COPD (chronic obstructive pulmonary disease) (HCC)    Diabetes ([place])    Hyperlipidemia    Hypertension    Kidney stone    Obesity    Tachycardia    TIA (transient ischemic attack)     Medications:  No recent PTA AC meds, pt was taking apixaban for PE. He claims he has not taken in months d/t cost. Not showing on dispense hx.  Assessment: 72 yo M with PMH COPD, HFpEF, h/o saddle PE, HTN, T2DM presents with respiratory distress. Patient was prescribed apixaban but he has not taken in months due to cost. He presented to ED this morning with respiratory distress and was subsequently diagnosed with PE when CTA showed extensive thrombus in the pulmonary arteries as proximal as the main pulmonary artery bifurcation extending bilaterally. Patient was initiated on heparin which was subsequently paused because he underwent bilateral pulmonary thrombectomy. Pharmacy  has now been consulted to resume heparin at previous rate with no bolus and manage subsequent dosing.  Date Time HL Rate/Comment 01/31 0059 0.21 Subthera; 1900 un/hr 01/31 0951 0.56 Thera; 2150 un/hr 01/31 1525 0.44 Thera x 2; 2150 un/hr  Date INR Plan  1/31 N/A 5 mg  2/1 1.3 7.5 mg   Baseline Labs: aPTT 31, INR 1.2, Hgb 12.7, Hct 40.9, Plts 169  Goal of Therapy:  Heparin level 0.3-0.7 units/ml Monitor platelets by anticoagulation protocol: Yes   Plan:  Heparin --Heparin level is therapeutic x 2 --Continue heparin infusion at 2150 units/hr --Re-check HL tomorrow AM --Daily CBC per protocol while on IV heparin  Warfarin --INR is subtherapeutic --Warfarin 7.5 mg tonight --Daily INR per protocol --CBC per protocol --Continue heparin infusion until two consecutive therapeutic INRs achieved  Benita Gutter 01/10/2023 8:13 AM

## 2023-01-10 NOTE — Consult Note (Signed)
ANTICOAGULATION CONSULT NOTE  Pharmacy Consult for Xarelto Indication: pulmonary embolus  Patient Measurements: Height: 6\' 1"  (185.4 cm) Weight: (!) 174.5 kg (384 lb 11.2 oz) IBW/kg (Calculated) : 79.9  Labs: Recent Labs    01/08/23 1133 01/08/23 1330 01/08/23 2058 01/09/23 0059 01/09/23 0951 01/09/23 1525 01/10/23 0353  HGB 12.7*  --   --  11.1*  --   --  9.8*  HCT 40.9  --   --  35.2*  --   --  28.5*  PLT 169  --   --  142*  --   --  152  APTT 31  --   --   --   --   --   --   LABPROT 14.7  --   --   --   --   --  15.7*  INR 1.2  --   --   --   --   --  1.3*  HEPARINUNFRC  --   --    < > 0.21* 0.56 0.44 0.41  CREATININE 1.29*  --   --  1.37*  --   --   --   TROPONINIHS 45* 43*  --   --   --   --   --    < > = values in this interval not displayed.     Estimated Creatinine Clearance: 83.5 mL/min (A) (by C-G formula based on SCr of 1.37 mg/dL (H)).   Medical History: Past Medical History:  Diagnosis Date   Arthritis    CHF (congestive heart failure) (HCC)    COPD (chronic obstructive pulmonary disease) (HCC)    Diabetes ([place])    Hyperlipidemia    Hypertension    Kidney stone    Obesity    Tachycardia    TIA (transient ischemic attack)     Medications:  No recent PTA AC meds, pt was taking apixaban for PE. He claims he has not taken in months d/t cost. Not showing on dispense hx.  During discussion 2/1 by pharmacist with patient for warfarin counseling, patient relayed that he had not been taking apixaban due to cost during insurance coverage gap at which point it became too expensive. He endorses being able to afford standard co-pay of $47/mo for apixaban / rivaroxaban. Also talked with patient about monitoring requirement for warfarin and there was concern that patient may have difficulty keeping these appointments as he does not have reliable transportation. Decision made to switch patient to rivaroxaban with plan to enroll patient in assistance program to help  with coverage gap.   Assessment: 72 yo M with PMH COPD, HFpEF, h/o saddle PE, HTN, T2DM presents with respiratory distress. Patient was prescribed apixaban but he has not taken in months due to cost. He presented to ED this morning with respiratory distress and was subsequently diagnosed with PE when CTA showed extensive thrombus in the pulmonary arteries as proximal as the main pulmonary artery bifurcation extending bilaterally. Patient was initiated on heparin which was subsequently paused because he underwent bilateral pulmonary thrombectomy. Pharmacy has now been consulted to resume heparin at previous rate with no bolus and manage subsequent dosing.  Date Time HL Rate/Comment 01/31 0059 0.21 Subthera; 1900 un/hr 01/31 0951 0.56 Thera; 2150 un/hr 01/31 1525 0.44 Thera x 2; 2150 un/hr  Date INR Plan  1/31 N/A 5 mg  2/1 1.3 DISCONTINUE   Baseline Labs: aPTT 31, INR 1.2, Hgb 12.7, Hct 40.9, Plts 169  Goal of Therapy:  Heparin level 0.3-0.7 units/ml  Monitor platelets by anticoagulation protocol: Yes   Plan:  Heparin --Discontinue heparin infusion  Warfarin --Discontinue warfarin  Rivaroxaban --Start rivaroxaban 15 mg BIDM x 21 days followed by rivaroxaban 20 mg qSupper for remaining duration of therapy --CBC per protocol  Benita Gutter 01/10/2023 3:31 PM

## 2023-01-10 NOTE — Progress Notes (Signed)
[facility] Progress Note  Patient: Thomas Tanner    ZDG:[id number]  DOA: 01/08/2023    Date of Service: the patient was seen and examined on 01/10/2023  Brief hospital course: 72 year old male with past medical history of COPD with chronic respiratory failure on 3.5 L nasal cannula, diastolic heart failure, previous saddle PE who had stopped his anticoagulation due to affordability as well as morbid obesity who presented to the emergency room on 1/30 with complaints of 3 days of shortness of breath with any kind of activity, difficult to recover from.  (Patient had a recent hospitalization for 6 days, discharged on 1/12, for COPD and pneumonia.) Patient found to be acutely hypoxic requiring 5 L nasal cannula, BNP of 797 and new extensive thrombus with evidence of right ventricular strain.  Vascular surgery was consulted and patient underwent thrombectomy.  Post procedure, patient's oxygenation improving and vascular surgery recommending lifelong anticoagulation.  Oxygenation improving and now treating patient for acute heart failure.   Assessment and Plan: * Pulmonary embolism ([place]) This is patient's secondary saddle pulmonary embolus.  Status post thrombectomy.  Needs to be on lifelong anticoagulation.  Had stopped taking anticoagulation after last PE secondary to cost.  Patient clarified that he can afford the monthly co-pay $50, but it puts him into the donut hole which makes the whole thing unaffordable.  Pharmacy working to set up patient with medication assistance through the company.  If we are able to do this, then he will go out on Xarelto.  Acute on chronic respiratory failure with hypoxia and hypercapnia (HCC) Baseline on 3.5 L.  Acute respiratory failure secondary to PE.  Status post thrombectomy, oxygenation improving.  Also with acute diastolic heart failure.  See below.  Diuresing.  Working to wean down oxygenation.  Chronic diastolic CHF (congestive heart failure)  (HCC) Patient's BNP is elevated on admission compared to prior in the setting of submassive PE with RV strain.  No significant lower extremity pitting edema interestingly.  Recheck post thrombectomy, elevated in the 700s, and this numbers likely lower than accurate given his morbid obesity.  He looks to be 20 pounds heavier from admission.  Have started IV Lasix.  And patient is already diuresed over a liter of fluid  AKI (acute kidney injury) ([place]) Elevated, possibly due to decompensated heart failure.  Recheck labs in the morning.  COPD (chronic obstructive pulmonary disease) (HCC) Given severity of COPD, patient will need triple bronchodilators.  It appears he is on DuoNebs only at home.  - Continue DuoNebs as needed - Start Dulera and Incruse  Morbid obesity ([place]) Meets criteria with BMI greater than 40       Body mass index is 50.76 kg/m.        Consultants: Vascular surgery  Procedures: Thrombectomy  Antimicrobials: None  Code Status: Full code   Subjective: Breathing somewhat easier, urinating well  Objective: Vital signs were reviewed and unremarkable. Vitals:   01/10/23 0838 01/10/23 1139  BP: 121/64 111/64  Pulse: 80 87  Resp: 20 (!) 24  Temp: (!) 97.5 F (36.4 C) 97.9 F (36.6 C)  SpO2: 98% 96%    Intake/Output Summary (Last 24 hours) at 01/10/2023 1532 Last data filed at 01/10/2023 1410 Gross per 24 hour  Intake 1563.31 ml  Output 3300 ml  Net -1736.69 ml   Filed Weights   01/08/23 1137 01/09/23 2007 01/10/23 0554  Weight: (!) 154.2 kg (!) 175.3 kg (!) 174.5 kg   Body mass index is 50.76 kg/m.  Exam:  General: Alert and oriented x 3, no acute distress HEENT: Normocephalic, atraumatic, narrow airway, neck is thick Cardiovascular: Regular rate and rhythm, S1-S2 Respiratory: Decreased breath sounds throughout secondary to body habitus Abdomen: Soft, obese, nontender, positive bowel sounds Musculoskeletal: No clubbing or cyanosis, trace  pitting edema Skin: No skin breaks or tears or lesions Psychiatry: Appropriate, no evidence of psychoses Neurology: No focal deficits  Data Reviewed: BNP in 700s  Disposition:  Status is: Inpatient Remains inpatient appropriate because:  -Improvement in hypoxia -Full diuresis -Financial assistance for Xarelto    Anticipated discharge date: 2/3  Family Communication: Will call family DVT Prophylaxis: Heparin changed over to Coumadin Rivaroxaban (XARELTO) tablet 15 mg  rivaroxaban (XARELTO) tablet 20 mg    Author: Annita Brod ,MD 01/10/2023 3:32 PM  To reach On-call, see care teams to locate the attending and reach out via [email or web address]. Between 7PM-7AM, please contact night-coverage If you still have difficulty reaching the attending provider, please page the [facility] (Director on Call) for [facility] on amion for assistance.

## 2023-01-10 NOTE — Progress Notes (Signed)
Progress Note    01/10/2023 11:05 AM 2 Days Post-Op  Subjective:  This is a 72 y.o. male past medical history significant for COPD on 3 L nasal cannula, HFpEF, saddle PE no longer on Eliquis (patient self discontinued secondary to inability to afford), hypertension, morbid obesity, presents to the emergency department shortness of breath.  States that he has had a mild cough since he left the hospital on the 16th.  Significantly worsened last night and today.  Endorses a productive cough and chest pain.  Feels like something is sitting on his chest.  Denies any fever or chills.  States that he is no longer on Eliquis because he is unable to afford this medication, not taking since Aug.      This morning he is resting comfortably in bed. He has no complaints. Vitals all remain stable. Still requires 4 liters nasal cannula oxygen. Positive productive cough.    Vitals:   01/10/23 0652 01/10/23 0838  BP: (!) 116/55 121/64  Pulse: 81 80  Resp: 18 20  Temp: 97.7 F (36.5 C) (!) 97.5 F (36.4 C)  SpO2: 98% 98%   Physical Exam: Cardiac:  RRR Lungs:  Clear with normal effort, diminished in the bases Incisions:  Rt groin. Dressing  C/D/I no hematoma or seroma to note.  Extremities:  Bilateral upper and lower edema/morbid obesity Abdomen:  Positive bowel sounds, soft, NT/ND Neurologic: AAOX3 Normal affect.   CBC    Component Value Date/Time   WBC 9.6 01/10/2023 0353   RBC 3.28 (L) 01/10/2023 0353   HGB 9.8 (L) 01/10/2023 0353   HGB 14.6 06/11/2013 0404   HCT 28.5 (L) 01/10/2023 0353   HCT 42.1 06/11/2013 0404   PLT 152 01/10/2023 0353   PLT 212 06/11/2013 0404   MCV 86.9 01/10/2023 0353   MCV 92 06/11/2013 0404   MCH 29.9 01/10/2023 0353   MCHC 34.4 01/10/2023 0353   RDW 21.6 (H) 01/10/2023 0353   RDW 14.4 06/11/2013 0404   LYMPHSABS 0.9 12/20/2022 0250   LYMPHSABS 2.5 06/11/2013 0404   MONOABS 0.3 12/20/2022 0250   MONOABS 0.7 06/11/2013 0404   EOSABS 0.0 12/20/2022 0250    EOSABS 0.3 06/11/2013 0404   BASOSABS 0.0 12/20/2022 0250   BASOSABS 0.1 06/11/2013 0404    BMET    Component Value Date/Time   NA 135 01/09/2023 0059   NA 140 06/11/2013 0404   K 4.3 01/09/2023 0059   K 5.1 06/11/2013 0404   CL 98 01/09/2023 0059   CL 107 06/11/2013 0404   CO2 26 01/09/2023 0059   CO2 29 06/11/2013 0404   GLUCOSE 246 (H) 01/09/2023 0059   GLUCOSE 97 06/11/2013 0404   BUN 20 01/09/2023 0059   BUN 18 06/11/2013 0404   CREATININE 1.37 (H) 01/09/2023 0059   CREATININE 0.87 06/11/2013 0404   CALCIUM 8.3 (L) 01/09/2023 0059   CALCIUM 9.1 06/11/2013 0404   GFRNONAA 55 (L) 01/09/2023 0059   GFRNONAA >60 06/11/2013 0404   GFRAA >60 08/07/2020 0542   GFRAA >60 06/11/2013 0404    INR    Component Value Date/Time   INR 1.3 (H) 01/10/2023 0353   INR 1.0 06/10/2013 1128     Intake/Output Summary (Last 24 hours) at 01/10/2023 1105 Last data filed at 01/10/2023 1000 Gross per 24 hour  Intake 1323.31 ml  Output 2800 ml  Net -1476.69 ml     Assessment/Plan:  72 y.o. male is s/p Pulmonary Thrombectomy 2 Days Post-Op  PLAN: Wean  oxygen as tolerated. Ordered incentive spirometry to assist with lung status with known prior pneumonia.  Antibiotics per primary team.  Anticoagulation per primary team. Patient started on coumadin 7.5 mg daily. Remains on heparin drip until therapeutic.   Ambulate 3 times a day with walker and assistance.  DVT prophylaxis:  Heparin Drip    Roe Koffman R Deniro [facility] 01/10/2023 11:05 AM

## 2023-01-10 NOTE — Evaluation (Signed)
Physical Therapy Evaluation Patient Details Name: Thomas Tanner MRN: [id number] DOB: [birth year] Today's Date: 01/10/2023  History of Present Illness  Pt is a 72 y.o. male presenting to hospital 01/08/23 with c/o SOB.  Pt admitted with pulmonary embolism, AKI, COPD, and chronic diastolic CHF.  S/p 1/30 B pulmonary thrombectomy with placement of an IVC filter.  PMH includes COPD on 3 L [place], HFpEF, saddle PE, htn, morbid obesity, TIA, tachycardia, and pulmonary thrombectomy 2021.  Clinical Impression  Prior to hospital admission, pt was ambulatory with walker 10-12 feet at a time within home and used manual w/c in community; fire department assists pt up/down home steps in w/c; lives with family in 1 level home with 4 STE; 3.5 to 4 L home O2 chronic; sleeps in recliner or hospital bed.  Mild B LE pain noted during session.  Currently pt is modified independent semi-supine to sitting edge of bed; SBA with transfers using bariatric RW; SBA to side step to R along bed a few feet with bariatric RW use; and min assist sit to semi-supine in bed.  Limited activity d/t pt fatigue and SOB (O2 sats 95% or greater on 5 L O2 during session).  Pt would benefit from skilled PT to address noted impairments and functional limitations (see below for any additional details).  Upon hospital discharge, pt would benefit from [place].    Recommendations for follow up therapy are one component of a multi-disciplinary discharge planning process, led by the attending physician.  Recommendations may be updated based on patient status, additional functional criteria and insurance authorization.  Follow Up Recommendations Home health PT      Assistance Recommended at Discharge Intermittent Supervision/Assistance  Patient can return home with the following  A little help with walking and/or transfers;A little help with bathing/dressing/bathroom;Assistance with cooking/housework;Assist for transportation;Help with stairs or  ramp for entrance    Equipment Recommendations Other (comment) (pt has RW, BSC, hospital bed, and manual w/c at home already)  Recommendations for Other Services  OT consult    Functional Status Assessment Patient has had a recent decline in their functional status and demonstrates the ability to make significant improvements in function in a reasonable and predictable amount of time.     Precautions / Restrictions Precautions Precautions: Fall Restrictions Weight Bearing Restrictions: No      Mobility  Bed Mobility Overal bed mobility: Needs Assistance Bed Mobility: Supine to Sit, Sit to Supine     Supine to sit: Modified independent (Device/Increase time), HOB elevated Sit to supine: Min assist   General bed mobility comments: heavy reliance on bed rails semi-supine to sitting edge of bed; min assist for B LE's sit to semi-supine in bed    Transfers Overall transfer level: Needs assistance Equipment used: Rolling walker (2 wheels) (bariatric) Transfers: Sit to/from Stand Sit to Stand: Supervision           General transfer comment: mild increased effort to stand up to walker    Ambulation/Gait Ambulation/Gait assistance: Supervision Gait Distance (Feet):  (pt able to side step to R along bed a few feet with walker use) Assistive device: Rolling walker (2 wheels) (bariatric)   Gait velocity: decreased     General Gait Details: mild increased effort to take steps  Stairs            Wheelchair Mobility    Modified Rankin (Stroke Patients Only)       Balance Overall balance assessment: Needs assistance Sitting-balance support: No upper extremity supported,  Feet supported Sitting balance-Leahy Scale: Good Sitting balance - Comments: steady sitting reaching within BOS   Standing balance support: Bilateral upper extremity supported, During functional activity, Reliant on assistive device for balance Standing balance-Leahy Scale: Good Standing  balance comment: heavy UE support on walker but overall steady                             Pertinent Vitals/Pain Pain Assessment Pain Assessment: Faces Faces Pain Scale: Hurts a little bit Pain Location: B LE's in general Pain Descriptors / Indicators: Sore, Tender Pain Intervention(s): Limited activity within patient's tolerance, Monitored during session, Repositioned HR 94-112 bpm during sessions activities.  O2 sats WFL on 5 L O2 via nasal cannula during session.    Home Living Family/patient expects to be discharged to:: Private residence Living Arrangements: Spouse/significant other;Children (Pt's wife, son, and grandson) Available Help at Discharge: Family;Friend(s);Available 24 hours/day Type of Home: House Home Access: Stairs to enter Entrance Stairs-Rails: Right;Left;Can reach both Entrance Stairs-Number of Steps: 4   Home Layout: One level Home Equipment: Shower seat;Wheelchair - Producer, television/film/video (2 wheels);BSC/3in1 Additional Comments: Uses 3.5 to 4 L home O2 chronic    Prior Function Prior Level of Function : Needs assist             Mobility Comments: Ambulatory with RW 10-12 feet at a time within home; uses manual w/c in community (can use w/c in home if needed); fire department assists pt in/out of home (pt in manual w/c and they lift him up/down steps of home as needed).  Sleeps in recliner or hospital bed depending on how he is feeling. ADLs Comments: Takes sponge baths.     Hand Dominance   Dominant Hand: Right    Extremity/Trunk Assessment   Upper Extremity Assessment Upper Extremity Assessment: Overall WFL for tasks assessed    Lower Extremity Assessment Lower Extremity Assessment: Generalized weakness    Cervical / Trunk Assessment Cervical / Trunk Assessment: Other exceptions Cervical / Trunk Exceptions: forward head/shoulders  Communication   Communication: No difficulties  Cognition Arousal/Alertness:  Awake/alert Behavior During Therapy: WFL for tasks assessed/performed Overall Cognitive Status: Within Functional Limits for tasks assessed                                 General Comments: Pt reports being HOH.        General Comments  Nursing cleared pt for participation in physical therapy.  Pt agreeable to PT session.    Exercises     Assessment/Plan    PT Assessment Patient needs continued PT services  PT Problem List Decreased strength;Decreased activity tolerance;Decreased balance;Decreased mobility;Pain       PT Treatment Interventions DME instruction;Gait training;Stair training;Therapeutic exercise;Therapeutic activities;Functional mobility training;Balance training;Manual techniques;Wheelchair mobility training;Patient/family education    PT Goals (Current goals can be found in the Care Plan section)  Acute Rehab PT Goals Patient Stated Goal: to return home PT Goal Formulation: With patient Time For Goal Achievement: 01/24/23 Potential to Achieve Goals: Good    Frequency Min 2X/week     Co-evaluation               AM-PAC PT "6 Clicks" Mobility  Outcome Measure Help needed turning from your back to your side while in a flat bed without using bedrails?: None Help needed moving from lying on your back to sitting on the side of  a flat bed without using bedrails?: None Help needed moving to and from a bed to a chair (including a wheelchair)?: A Little Help needed standing up from a chair using your arms (e.g., wheelchair or bedside chair)?: A Little Help needed to walk in hospital room?: A Lot Help needed climbing 3-5 steps with a railing? : Total 6 Click Score: 17    End of Session Equipment Utilized During Treatment: Oxygen (5 L via nasal cannula) Activity Tolerance: Patient limited by fatigue Patient left: in bed;with call bell/phone within reach;with bed alarm set;with nursing/sitter in room Nurse Communication: Mobility  status;Precautions PT Visit Diagnosis: Other abnormalities of gait and mobility (R26.89);Muscle weakness (generalized) (M62.81)    Time: 8099-8338 PT Time Calculation (min) (ACUTE ONLY): 22 min   Charges:   PT Evaluation $PT Eval Low Complexity: 1 Low         Nissi Doffing, PT 01/10/23, 4:25 PM

## 2023-01-11 DIAGNOSIS — J9621 Acute and chronic respiratory failure with hypoxia: Secondary | ICD-10-CM | POA: Diagnosis not present

## 2023-01-11 DIAGNOSIS — I5033 Acute on chronic diastolic (congestive) heart failure: Secondary | ICD-10-CM | POA: Diagnosis not present

## 2023-01-11 DIAGNOSIS — I2602 Saddle embolus of pulmonary artery with acute cor pulmonale: Secondary | ICD-10-CM | POA: Diagnosis not present

## 2023-01-11 DIAGNOSIS — N179 Acute kidney failure, unspecified: Secondary | ICD-10-CM | POA: Diagnosis not present

## 2023-01-11 LAB — CULTURE, BLOOD (ROUTINE X 2): Special Requests: ADEQUATE

## 2023-01-11 LAB — CBC
HCT: 33.8 % — ABNORMAL LOW (ref 39.0–52.0)
Hemoglobin: 10 g/dL — ABNORMAL LOW (ref 13.0–17.0)
MCH: 24.1 pg — ABNORMAL LOW (ref 26.0–34.0)
MCHC: 29.6 g/dL — ABNORMAL LOW (ref 30.0–36.0)
MCV: 81.4 fL (ref 80.0–100.0)
Platelets: 156 10*3/uL (ref 150–400)
RBC: 4.15 MIL/uL — ABNORMAL LOW (ref 4.22–5.81)
RDW: 18.6 % — ABNORMAL HIGH (ref 11.5–15.5)
WBC: 8.1 10*3/uL (ref 4.0–10.5)
nRBC: 0 % (ref 0.0–0.2)

## 2023-01-11 LAB — BASIC METABOLIC PANEL
Anion gap: 8 (ref 5–15)
BUN: 20 mg/dL (ref 8–23)
CO2: 30 mmol/L (ref 22–32)
Calcium: 8 mg/dL — ABNORMAL LOW (ref 8.9–10.3)
Chloride: 99 mmol/L (ref 98–111)
Creatinine, Ser: 1.14 mg/dL (ref 0.61–1.24)
GFR, Estimated: >60 mL/min (ref >60)
Glucose, Bld: 111 mg/dL — ABNORMAL HIGH (ref 70–99)
Potassium: 3.9 mmol/L (ref 3.5–5.1)
Sodium: 137 mmol/L (ref 135–145)

## 2023-01-11 LAB — GLUCOSE, CAPILLARY
Glucose-Capillary: 102 mg/dL — ABNORMAL HIGH (ref 70–99)
Glucose-Capillary: 113 mg/dL — ABNORMAL HIGH (ref 70–99)
Glucose-Capillary: 119 mg/dL — ABNORMAL HIGH (ref 70–99)

## 2023-01-11 MED ORDER — RIVAROXABAN 20 MG PO TABS: 20.0000 mg | ORAL_TABLET | Freq: Every day | ORAL | 3 refills | Status: DC

## 2023-01-11 NOTE — Progress Notes (Signed)
Mobility Specialist - Progress Note   01/11/23 1500  Mobility  Activity Refused mobility     Pt declined mobility. Pt politely refused but did get mildly agitated when mentioning OOB mobility. Pt reports he's being d/c today and feels he's fine and doesn't need to do any further activity today. Denied any further needs at this time.    Kathee Delton Mobility Specialist 01/11/23, 3:07 PM

## 2023-01-11 NOTE — Discharge Summary (Signed)
Physician Discharge Summary   Patient: Thomas Tanner MRN: [id number] DOB: 1951/09/12  Admit date:     01/08/2023  Discharge date: 01/11/23  Discharge Physician: Annita Brod   PCP: Jonetta Osgood, NP   Recommendations at discharge:   New medication: Xarelto 15 mg p.o. twice daily x 3 weeks, then changed to 20 mg p.o. daily starting 2/22 Patient being discharged with home health PT/OT/RN  Discharge Diagnoses: Principal Problem:   Pulmonary embolism ([place]) Active Problems:   Acute on chronic respiratory failure with hypoxia and hypercapnia (HCC)   Acute on chronic diastolic (congestive) heart failure (HCC)   AKI (acute kidney injury) ([place])   COPD (chronic obstructive pulmonary disease) ([place])   Morbid obesity ([place])  Resolved Problems:   * No resolved hospital problems. *  Hospital Course: 72 year old male with past medical history of COPD with chronic respiratory failure on 3.5 L nasal cannula, diastolic heart failure, previous saddle PE who had stopped his anticoagulation due to affordability as well as morbid obesity who presented to the emergency room on 1/30 with complaints of 3 days of shortness of breath with any kind of activity, difficult to recover from.  (Patient had a recent hospitalization for 6 days, discharged on 1/12, for COPD and pneumonia.) Patient found to be acutely hypoxic requiring 5 L nasal cannula, BNP of 797 and new extensive thrombus with evidence of right ventricular strain.  Vascular surgery was consulted and patient underwent thrombectomy.  Post procedure, patient's oxygenation improving and vascular surgery recommending lifelong anticoagulation.  Oxygenation improving and now treating patient for acute heart failure.  Assessment and Plan: * Pulmonary embolism ([place]) This is patient's secondary saddle pulmonary embolus.  Status post thrombectomy.  Needs to be on lifelong anticoagulation.  Had stopped taking anticoagulation after last PE  secondary to cost.  Patient clarified that he can afford the monthly co-pay $50, but it puts him into the donut hole which makes the whole thing unaffordable.    Pharmacy help patient get enrolled so that after April 1, when enrollment begins, company will contact him.  Outpatient pharmacy will also assist patient with this process.  Starter pack for Xarelto delivered to patient's room.  Additional prescriptions for Xarelto 20 mg once a day after starter pack is finished sent to patient's regular pharmacy at [facility].  If patient enters coverage, he has instructions to call over to outpatient pharmacy for free drug assistance.  Acute on chronic respiratory failure with hypoxia and hypercapnia (HCC) Baseline on 3.5 L.  Acute respiratory failure secondary to PE.  Status post thrombectomy, oxygenation improving.  Also with acute diastolic heart failure.  See below.  Diuresing, and by day of discharge, down to baseline  Acute on chronic diastolic (congestive) heart failure (HCC) Patient's BNP is elevated on admission compared to prior in the setting of submassive PE with RV strain.  No significant lower extremity pitting edema interestingly.    Rechecked BNP post thrombectomy, and was elevated in the 700s, and this number is likely lower than where he truly is given his morbid obesity.  Started on IV Lasix and patient to date has diuresed over 7 L and is -4 L deficient.  He will resume his normal Lasix upon discharge  AKI (acute kidney injury) ([place]) Secondary to decompensated heart failure.  With diuresis, by day of discharge, creatinine at 1.14 with normal GFR  COPD (chronic obstructive pulmonary disease) (HCC) Given severity of COPD, patient will need triple bronchodilators.  It appears he is on  DuoNebs only at home.  - Continue DuoNebs as needed - Start [employer]  Morbid obesity (HCC) Meets criteria with BMI greater than 40         Consultants: Vascular surgery Procedures  performed: Status post thrombectomy Disposition: Home with home health Diet recommendation:  Discharge Diet Orders (From admission, onward)     Start     Ordered   01/11/23 0000  Diet - low sodium heart healthy        01/11/23 1426           Heart healthy DISCHARGE MEDICATION: Allergies as of 01/11/2023       Reactions   Morphine Anxiety, Other (See Comments)   Hallucinations Pt states 6.17.22 that he is not allergic to this medication   Azithromycin Itching   Localized pruritus on arm with IV   Morphine And Related Anxiety        Medication List     STOP taking these medications    meloxicam 7.5 MG tablet Commonly known as: MOBIC       TAKE these medications    albuterol 108 (90 Base) MCG/ACT inhaler Commonly known as: Ventolin HFA Inhale 2 puffs into the lungs every 6 (six) hours as needed for wheezing or shortness of breath.   ALPRAZolam 0.25 MG tablet Commonly known as: XANAX Take 1 tablet (0.25 mg total) by mouth daily as needed (severe anxiety). May take 1 additional dose after 2 hours if the first dose is not effective.   cyanocobalamin 1000 MCG tablet Take 1 tablet (1,000 mcg total) by mouth daily.   famotidine 20 MG tablet Commonly known as: PEPCID Take 1 tablet (20 mg total) by mouth 2 (two) times daily.   furosemide 40 MG tablet Commonly known as: Lasix Take 1 tablet (40 mg total) by mouth daily.   hydrOXYzine 25 MG tablet Commonly known as: ATARAX Take 1 tablet (25 mg total) by mouth 3 (three) times daily as needed for itching.   ipratropium-albuterol 0.5-2.5 (3) MG/3ML Soln Commonly known as: DUONEB Take 3 mLs by nebulization every 6 (six) hours as needed.   iron polysaccharides 150 MG capsule Commonly known as: NIFEREX Take 1 capsule (150 mg total) by mouth daily.   lansoprazole 30 MG capsule Commonly known as: PREVACID Take 1 capsule (30 mg total) by mouth daily before breakfast.   metoprolol succinate 50 MG 24 hr  tablet Commonly known as: TOPROL-XL Take 1 tablet (50 mg total) by mouth daily. Take with or immediately following a meal.   OXYGEN Inhale 2.5 L into the lungs. 24 HRS CONTINUOUS OXYGEN USES [employer] [facility]   phenylephrine-shark liver oil-mineral oil-petrolatum 0.25-14-74.9 % rectal ointment Commonly known as: PREPARATION H Place 1 application rectally 2 (two) times daily as needed for hemorrhoids.   rosuvastatin 20 MG tablet Commonly known as: CRESTOR Take 1 tablet (20 mg total) by mouth daily.   senna-docusate 8.6-50 MG tablet Commonly known as: Senokot-S Take 1 tablet by mouth 2 (two) times daily.   Xarelto Starter Pack Generic drug: Rivaroxaban Stater Pack (15 mg and 20 mg) Follow package directions: Take one 15mg  tablet by mouth twice a day. On day 22, switch to one 20mg  tablet once a day. Take with food.   rivaroxaban 20 MG Tabs tablet Commonly known as: XARELTO Take 1 tablet (20 mg total) by mouth daily with supper. Start taking on: February 08, 2023        Discharge Exam: Weights   01/09/23 2007 01/10/23  [id number] 01/11/23 0500  Weight: (!) 175.3 kg (!) 174.5 kg (!) 172.9 kg   General: Alert and oriented x 3, no acute distress Lungs: Decreased breath sounds throughout  Condition at discharge: improving  The results of significant diagnostics from this hospitalization (including imaging, microbiology, ancillary and laboratory) are listed below for reference.   Imaging Studies: PERIPHERAL VASCULAR CATHETERIZATION  Result Date: 01/08/2023 See surgical note for result.  CT Angio Chest PE W/Cm &/Or Wo Cm  Result Date: 01/08/2023 CLINICAL DATA:  Shortness of breath EXAM: CT ANGIOGRAPHY CHEST WITH CONTRAST TECHNIQUE: Multidetector CT imaging of the chest was performed using the standard protocol during bolus administration of intravenous contrast. Multiplanar CT image reconstructions and MIPs were obtained to evaluate the vascular anatomy. RADIATION DOSE  REDUCTION: This exam was performed according to the departmental dose-optimization program which includes automated exposure control, adjustment of the mA and/or kV according to patient size and/or use of iterative reconstruction technique. CONTRAST:  59mL OMNIPAQUE IOHEXOL 350 MG/ML SOLN COMPARISON:  05/05/2021 CTA chest FINDINGS: Cardiovascular: Extensive thrombus in the pulmonary arteries, as proximal as the main pulmonary artery bifurcation (series 6, image 166), extending into the right and left main pulmonary arteries and their branch vessels. Thrombus is seen in arteries of each pulmonary lobe. RV/LV ratio of 2.05, concerning for right heart strain. Normal cardiac size. Coronary artery calcifications. No pericardial effusion. Mediastinum/Nodes: No enlarged mediastinal, hilar, or axillary lymph nodes. Redemonstrated heterogeneous left thyroid lobe, incompletely imaged. Lungs/Pleura: No focal pulmonary opacity or pleural effusion. Centrilobular emphysema. Upper Abdomen: No acute abnormality. Musculoskeletal: No chest wall abnormality. No acute or significant osseous findings. Review of the MIP images confirms the above findings. IMPRESSION: 1. Extensive thrombus in the pulmonary arteries, as proximal as the main pulmonary artery bifurcation, extending into the right and left pulmonary artery and their branch vessels. Thrombus is seen in arteries of each pulmonary lobe. RV/LV ratio of 2.05, concerning for right heart strain. 2. Centrilobular emphysema. 3. Coronary artery disease. Emphysema (ICD10-J43.9). Positive for acute PE with CT evidence of right heart strain (RV/LV Ratio = 2.05) consistent with at least submassive (intermediate risk) PE. The presence of right heart strain has been associated with an increased risk of morbidity and mortality. Please refer to the "Code PE Focused" order set in EPIC. These findings were discussed by telephone on 01/08/2023 at 2:01 pm with provider [facility] .  Electronically Signed   By: Merilyn Baba M.D.   On: 01/08/2023 14:03   DG Chest Portable 1 View  Result Date: 01/08/2023 CLINICAL DATA:  Shortness of breath EXAM: PORTABLE CHEST 1 VIEW COMPARISON:  None Available. FINDINGS: The heart size and mediastinal contours are within normal limits. Both lungs are clear. The visualized skeletal structures are unremarkable. IMPRESSION: No active disease. Electronically Signed   By: Marin Roberts M.D.   On: 01/08/2023 12:19   DG Chest Port 1 View  Result Date: 12/15/2022 CLINICAL DATA:  Difficulty breathing. EXAM: PORTABLE CHEST 1 VIEW COMPARISON:  May 19, 2021 FINDINGS: There is stable mild to moderate severity enlargement of the cardiac silhouette. Diffusely increased interstitial lung markings are seen with mild areas of atelectasis and/or infiltrate noted within the bilateral lung bases. There is no evidence of a pleural effusion or pneumothorax. The visualized skeletal structures are unremarkable. IMPRESSION: 1. Cardiomegaly with mild interstitial edema. 2. Mild bibasilar atelectasis and/or infiltrate. Electronically Signed   By: Virgina Norfolk M.D.   On: 12/15/2022 22:35    Microbiology: Results for orders placed or  performed during the hospital encounter of 01/08/23  Culture, blood (routine x 2)     Status: Abnormal   Collection Time: 01/08/23 11:34 AM   Specimen: BLOOD LEFT ARM  Result Value Ref Range Status   Specimen Description   Final    BLOOD LEFT ARM Performed at [facility], [address] [place], [place]  [address]    Special Requests   Final    BOTTLES DRAWN AEROBIC AND ANAEROBIC Blood Culture adequate volume Performed at [facility], [address] [place], [place] [address]    Culture  Setup Time   Final    GRAM POSITIVE COCCI IN BOTH AEROBIC AND ANAEROBIC BOTTLES CRITICAL VALUE NOTED.  VALUE IS CONSISTENT WITH PREVIOUSLY REPORTED AND CALLED VALUE. Performed at [facility], [address]  [place], [place] [address]    Culture (A)  Final    STAPHYLOCOCCUS EPIDERMIDIS SUSCEPTIBILITIES PERFORMED ON PREVIOUS CULTURE WITHIN THE LAST 5 DAYS. Performed at [facility], [place] [address] [place], [place] [address]    Report Status 01/11/2023 FINAL  Final  Resp panel by RT-PCR (RSV, Flu A&B, Covid) Anterior Nasal Swab     Status: None   Collection Time: 01/08/23 12:10 PM   Specimen: Anterior Nasal Swab  Result Value Ref Range Status   SARS Coronavirus 2 by RT PCR NEGATIVE NEGATIVE Final    Comment: (NOTE) SARS-CoV-2 target nucleic acids are NOT DETECTED.  The SARS-CoV-2 RNA is generally detectable in upper respiratory specimens during the acute phase of infection. The lowest concentration of SARS-CoV-2 viral copies this assay can detect is 138 copies/mL. A negative result does not preclude SARS-Cov-2 infection and should not be used as the sole basis for treatment or other patient management decisions. A negative result may occur with  improper specimen collection/handling, submission of specimen other than nasopharyngeal swab, presence of viral mutation(s) within the areas targeted by this assay, and inadequate number of viral copies(<138 copies/mL). A negative result must be combined with clinical observations, patient history, and epidemiological information. The expected result is Negative.  Fact Sheet for Patients:  EntrepreneurPulse.com.au  Fact Sheet for Healthcare Providers:  IncredibleEmployment.be  This test is no t yet approved or cleared by the [place] FDA and  has been authorized for detection and/or diagnosis of SARS-CoV-2 by FDA under an Emergency Use Authorization (EUA). This EUA will remain  in effect (meaning this test can be used) for the duration of the COVID-19 declaration under Section 564(b)(1) of the Act, 21 U.S.C.section 360bbb-3(b)(1), unless the authorization is terminated  or revoked sooner.        Influenza A by PCR NEGATIVE NEGATIVE Final   Influenza B by PCR NEGATIVE NEGATIVE Final    Comment: (NOTE) The Xpert Xpress SARS-CoV-2/FLU/RSV plus assay is intended as an aid in the diagnosis of influenza from Nasopharyngeal swab specimens and should not be used as a sole basis for treatment. Nasal washings and aspirates are unacceptable for Xpert Xpress SARS-CoV-2/FLU/RSV testing.  Fact Sheet for Patients: EntrepreneurPulse.com.au  Fact Sheet for Healthcare Providers: IncredibleEmployment.be  This test is not yet approved or cleared by the [place] FDA and has been authorized for detection and/or diagnosis of SARS-CoV-2 by FDA under an Emergency Use Authorization (EUA). This EUA will remain in effect (meaning this test can be used) for the duration of the COVID-19 declaration under Section 564(b)(1) of the Act, 21 U.S.C. section 360bbb-3(b)(1), unless the authorization is terminated or revoked.     Resp  Syncytial Virus by PCR NEGATIVE NEGATIVE Final    Comment: (NOTE) Fact Sheet for Patients: EntrepreneurPulse.com.au  Fact Sheet for Healthcare Providers: IncredibleEmployment.be  This test is not yet approved or cleared by the [place] FDA and has been authorized for detection and/or diagnosis of SARS-CoV-2 by FDA under an Emergency Use Authorization (EUA). This EUA will remain in effect (meaning this test can be used) for the duration of the COVID-19 declaration under Section 564(b)(1) of the Act, 21 U.S.C. section 360bbb-3(b)(1), unless the authorization is terminated or revoked.  Performed at [facility], [address] [place], [place] [address]   Culture, blood (routine x 2)     Status: Abnormal   Collection Time: 01/08/23 12:33 PM   Specimen: BLOOD RIGHT FOREARM  Result Value Ref Range Status   Specimen Description   Final    BLOOD RIGHT FOREARM Performed at [facility], [place] [address] [place], [place] [address]    Special Requests   Final    BOTTLES DRAWN AEROBIC AND ANAEROBIC Blood Culture results may not be optimal due to an excessive volume of blood received in culture bottles Performed at [facility], [address] [place], [place] [address]    Culture  Setup Time   Final    IN BOTH AEROBIC AND ANAEROBIC BOTTLES GRAM POSITIVE COCCI CRITICAL RESULT CALLED TO, READ BACK BY AND VERIFIED WITHVirl Cagey [id number] [id number] 01/09/23 HNM Performed at [facility], [place] [address], [place], [place] [address]    Culture STAPHYLOCOCCUS EPIDERMIDIS (A)  Final   Report Status 01/11/2023 FINAL  Final   Organism ID, Bacteria STAPHYLOCOCCUS EPIDERMIDIS  Final      Susceptibility   Staphylococcus epidermidis - MIC*    CIPROFLOXACIN 2 INTERMEDIATE Intermediate     ERYTHROMYCIN >=8 RESISTANT Resistant     GENTAMICIN 2 SENSITIVE Sensitive     OXACILLIN >=4 RESISTANT Resistant     TETRACYCLINE >=16 RESISTANT Resistant     VANCOMYCIN 1 SENSITIVE Sensitive     TRIMETH/SULFA <=10 SENSITIVE Sensitive     CLINDAMYCIN RESISTANT Resistant     RIFAMPIN <=0.5 SENSITIVE Sensitive     Inducible Clindamycin POSITIVE Resistant     * STAPHYLOCOCCUS EPIDERMIDIS  Blood Culture ID Panel (Reflexed)     Status: Abnormal   Collection Time: 01/08/23 12:33 PM  Result Value Ref Range Status   Enterococcus faecalis NOT DETECTED NOT DETECTED Final   Enterococcus Faecium NOT DETECTED NOT DETECTED Final   Listeria monocytogenes NOT DETECTED NOT DETECTED Final   Staphylococcus species DETECTED (A) NOT DETECTED Final    Comment: CRITICAL RESULT CALLED TO, READ BACK BY AND VERIFIED WITHRamond Craver [facility] PHARMD 1607 01/09/23 HNM    Staphylococcus aureus (BCID) NOT DETECTED NOT DETECTED Final   Staphylococcus epidermidis DETECTED (A) NOT DETECTED Final    Comment: Methicillin (oxacillin) resistant coagulase negative staphylococcus. Possible blood culture contaminant  (unless isolated from more than one blood culture draw or clinical case suggests pathogenicity). No antibiotic treatment is indicated for blood  culture contaminants. CRITICAL RESULT CALLED TO, READ BACK BY AND VERIFIED WITH: CARISSA [facility] PHARMD [id number] 01/09/23 HNM    Staphylococcus lugdunensis NOT DETECTED NOT DETECTED Final   Streptococcus species NOT DETECTED NOT DETECTED Final   Streptococcus agalactiae NOT DETECTED NOT DETECTED Final   Streptococcus pneumoniae NOT DETECTED NOT DETECTED Final   Streptococcus pyogenes NOT DETECTED NOT DETECTED Final   A.calcoaceticus-baumannii NOT DETECTED NOT DETECTED Final   Bacteroides fragilis NOT DETECTED  NOT DETECTED Final   Enterobacterales NOT DETECTED NOT DETECTED Final   Enterobacter cloacae complex NOT DETECTED NOT DETECTED Final   Escherichia coli NOT DETECTED NOT DETECTED Final   Klebsiella aerogenes NOT DETECTED NOT DETECTED Final   Klebsiella oxytoca NOT DETECTED NOT DETECTED Final   Klebsiella pneumoniae NOT DETECTED NOT DETECTED Final   Proteus species NOT DETECTED NOT DETECTED Final   Salmonella species NOT DETECTED NOT DETECTED Final   Serratia marcescens NOT DETECTED NOT DETECTED Final   Haemophilus influenzae NOT DETECTED NOT DETECTED Final   Neisseria meningitidis NOT DETECTED NOT DETECTED Final   Pseudomonas aeruginosa NOT DETECTED NOT DETECTED Final   Stenotrophomonas maltophilia NOT DETECTED NOT DETECTED Final   Candida albicans NOT DETECTED NOT DETECTED Final   Candida auris NOT DETECTED NOT DETECTED Final   Candida glabrata NOT DETECTED NOT DETECTED Final   Candida krusei NOT DETECTED NOT DETECTED Final   Candida parapsilosis NOT DETECTED NOT DETECTED Final   Candida tropicalis NOT DETECTED NOT DETECTED Final   Cryptococcus neoformans/gattii NOT DETECTED NOT DETECTED Final   Methicillin resistance mecA/C DETECTED (A) NOT DETECTED Final    Comment: CRITICAL RESULT CALLED TO, READ BACK BY AND VERIFIED WITHCheron Every: CARISSA DOLAN  [facility] [id number] 01/09/23 HNM Performed at [facility], [address] [place], [place] [address]   MRSA Next Gen by PCR, Nasal     Status: None   Collection Time: 01/08/23  6:32 PM   Specimen: Nasal Mucosa; Nasal Swab  Result Value Ref Range Status   MRSA by PCR Next Gen NOT DETECTED NOT DETECTED Final    Comment: (NOTE) The GeneXpert MRSA Assay (FDA approved for NASAL specimens only), is one component of a comprehensive MRSA colonization surveillance program. It is not intended to diagnose MRSA infection nor to guide or monitor treatment for MRSA infections. Test performance is not FDA approved in patients less than 70 years old. Performed at [facility], [address] [place], [place] [address]   Culture, blood (Routine X 2) w Reflex to ID Panel     Status: None (Preliminary result)   Collection Time: 01/09/23 12:27 PM   Specimen: BLOOD  Result Value Ref Range Status   Specimen Description BLOOD BLOOD LEFT HAND  Final   Special Requests   Final    BOTTLES DRAWN AEROBIC AND ANAEROBIC Blood Culture adequate volume   Culture   Final    NO GROWTH 2 DAYS Performed at [facility], [address] [place], [place] [address]    Report Status PENDING  Incomplete  Culture, blood (Routine X 2) w Reflex to ID Panel     Status: None (Preliminary result)   Collection Time: 01/09/23 12:30 PM   Specimen: BLOOD  Result Value Ref Range Status   Specimen Description BLOOD LEFT ANTECUBITAL  Final   Special Requests   Final    BOTTLES DRAWN AEROBIC AND ANAEROBIC Blood Culture results may not be optimal due to an excessive volume of blood received in culture bottles   Culture   Final    NO GROWTH 2 DAYS Performed at [facility], [address] [place], [place] [address]    Report Status PENDING  Incomplete    Labs: CBC: Recent Labs  Lab 01/08/23 1133 01/09/23 0059 01/10/23 0353 01/11/23 0139  WBC 8.7 7.3 9.6 8.1  HGB 12.7* 11.1* 9.8* 10.0*   HCT 40.9 35.2* 28.5* 33.8*  MCV 85.0 83.2 86.9 81.4  PLT 169 142* 152 156  Basic Metabolic Panel: Recent Labs  Lab 01/08/23 1133 01/09/23 0059 01/11/23 0139  NA 134* 135 137  K 4.2 4.3 3.9  CL 98 98 99  CO2 27 26 30   GLUCOSE 182* 246* 111*  BUN 17 20 20   CREATININE 1.29* 1.37* 1.14  CALCIUM 8.2* 8.3* 8.0*   Liver Function Tests: No results for input(s): "AST", "ALT", "ALKPHOS", "BILITOT", "PROT", "ALBUMIN" in the last 168 hours. CBG: Recent Labs  Lab 01/10/23 1136 01/10/23 1733 01/10/23 2128 01/11/23 0741 01/11/23 1143  GLUCAP 170* 153* 138* 102* 113*    Discharge time spent: less than 30 minutes.  Signed: Annita Brod, MD [facility] 01/11/2023

## 2023-01-11 NOTE — Care Management Important Message (Signed)
Important Message  Patient Details  Name: Thomas Tanner MRN: [id number] Date of Birth: May 25, 1951   Medicare Important Message Given:  Yes     Dannette Barbara 01/11/2023, 10:32 AM

## 2023-01-11 NOTE — Progress Notes (Addendum)
Received Md order to discharge patient to home,. Reviewed home meds, prescriptions, discharge instructions and follow up appointments with patient and patient verbalized understanding .

## 2023-01-11 NOTE — Progress Notes (Signed)
Progress Note    01/11/2023 11:33 AM 3 Days Post-Op  Subjective:  Mr Bradford Cazier is a 72 yo male with past medical history significant for COPD on 3 L nasal cannula, HFpEF, saddle PE no longer on Eliquis (patient self discontinued secondary to inability to afford), hypertension, morbid obesity, presents to the emergency department shortness of breath.  States that he has had a mild cough since he left the hospital on the 16th.  Significantly worsened last night and today.  Endorses a productive cough and chest pain.  Feels like something is sitting on his chest.  Denies any fever or chills.  States that he is no longer on Eliquis because he is unable to afford this medication, not taking since Aug.       This morning he is resting comfortably in bed. He has no complaints. Vitals all remain stable. Still requires 2 liters nasal cannula oxygen down from 4 liters yesterday. Positive productive cough.    Vitals:   01/11/23 0743 01/11/23 0900  BP: 115/61   Pulse: 85   Resp: 20   Temp: 97.9 F (36.6 C)   SpO2: 97% 96%   Physical Exam: Cardiac:  RRR Lungs:  Clear with normal effort, diminished in the bases  Incisions:  Rt groin. Dressing C/D/I no hematoma or seroma to note.  Extremities:  Bilateral upper and lower edema/morbid obesity. Abdomen:  Bilateral upper and lower edema/morbid obesity  Neurologic: AAOX3 Normal affect.   CBC    Component Value Date/Time   WBC 8.1 01/11/2023 0139   RBC 4.15 (L) 01/11/2023 0139   HGB 10.0 (L) 01/11/2023 0139   HGB 14.6 06/11/2013 0404   HCT 33.8 (L) 01/11/2023 0139   HCT 42.1 06/11/2013 0404   PLT 156 01/11/2023 0139   PLT 212 06/11/2013 0404   MCV 81.4 01/11/2023 0139   MCV 92 06/11/2013 0404   MCH 24.1 (L) 01/11/2023 0139   MCHC 29.6 (L) 01/11/2023 0139   RDW 18.6 (H) 01/11/2023 0139   RDW 14.4 06/11/2013 0404   LYMPHSABS 0.9 12/20/2022 0250   LYMPHSABS 2.5 06/11/2013 0404   MONOABS 0.3 12/20/2022 0250   MONOABS 0.7 06/11/2013  0404   EOSABS 0.0 12/20/2022 0250   EOSABS 0.3 06/11/2013 0404   BASOSABS 0.0 12/20/2022 0250   BASOSABS 0.1 06/11/2013 0404    BMET    Component Value Date/Time   NA 137 01/11/2023 0139   NA 140 06/11/2013 0404   K 3.9 01/11/2023 0139   K 5.1 06/11/2013 0404   CL 99 01/11/2023 0139   CL 107 06/11/2013 0404   CO2 30 01/11/2023 0139   CO2 29 06/11/2013 0404   GLUCOSE 111 (H) 01/11/2023 0139   GLUCOSE 97 06/11/2013 0404   BUN 20 01/11/2023 0139   BUN 18 06/11/2013 0404   CREATININE 1.14 01/11/2023 0139   CREATININE 0.87 06/11/2013 0404   CALCIUM 8.0 (L) 01/11/2023 0139   CALCIUM 9.1 06/11/2013 0404   GFRNONAA >60 01/11/2023 0139   GFRNONAA >60 06/11/2013 0404   GFRAA >60 08/07/2020 0542   GFRAA >60 06/11/2013 0404    INR    Component Value Date/Time   INR 1.3 (H) 01/10/2023 0353   INR 1.0 06/10/2013 1128     Intake/Output Summary (Last 24 hours) at 01/11/2023 1133 Last data filed at 01/11/2023 0955 Gross per 24 hour  Intake 1196.84 ml  Output 3700 ml  Net -2503.16 ml     Assessment/Plan:  72 y.o. male is s/p Pulmonary Thrombectomy  3 Days Post-Op  PLAN: Wean oxygen as tolerated. Incentive spirometry to assist with lung status with known prior pneumonia.  Antibiotics per primary team.  Anticoagulation per primary team. Patient started on coumadin 7.5 mg daily. Heparin Drip stopped. Coumadin discontinued. Patient started on Xarelto yesterday 15 mg PO BID to convert to 20 mg QD on 01/31/23  Ambulate 3 times a day with walker and assistance.  DVT prophylaxis: Xarelto   Drema [facility] 01/11/2023 11:33 AM

## 2023-01-11 NOTE — Evaluation (Signed)
Occupational Therapy Evaluation Patient Details Name: Thomas Tanner MRN: [id number] DOB: [birth year] Today's Date: 01/11/2023   History of Present Illness Pt is a 72 y.o. male presenting to hospital 01/08/23 with c/o SOB.  Pt admitted with pulmonary embolism, AKI, COPD, and chronic diastolic CHF.  S/p 1/30 B pulmonary thrombectomy with placement of an IVC filter.  PMH includes COPD on 3 L [place], HFpEF, saddle PE, htn, morbid obesity, TIA, tachycardia, and pulmonary thrombectomy 2021.   Clinical Impression   Chart reviewed, pt greeted in bed, agreeable to OT evaluation with encouragement. Pt reports he has not started home health therapy recommended from previous admission. PTA pt amb very short household distances with RW, has a pwc/mwc for longer distances in home and community, has assist for ADL/IADL. Pt presents with deficits in strength, endurance, activity tolerance, balance all affecting safe and optimal ADL completion. Pt endorses he wants to discharge home, has the assist he needs at current function level. Pt requires supervision for supine>sit, MOD A for sit>supine, STS with supervision with RW, three lateral steps up the bed to the right with supervision. SET UP required for grooming tasks. Discussed with pt importance of mobility and oob so further decline does not occur for safe discharge home. Pt in agreement. At this time recommend discharge home with [facility], family assist for ADL at this time. OT will continue to follow acutely.        Recommendations for follow up therapy are one component of a multi-disciplinary discharge planning process, led by the attending physician.  Recommendations may be updated based on patient status, additional functional criteria and insurance authorization.   Follow Up Recommendations  Home health OT     Assistance Recommended at Discharge Frequent or constant Supervision/Assistance  Patient can return home with the following A lot of help with  walking and/or transfers;A lot of help with bathing/dressing/bathroom;Help with stairs or ramp for entrance    Functional Status Assessment  Patient has had a recent decline in their functional status and demonstrates the ability to make significant improvements in function in a reasonable and predictable amount of time.  Equipment Recommendations  None recommended by OT;Other (comment) (pt has recommended equipment)    Recommendations for Other Services       Precautions / Restrictions Precautions Precautions: Fall Restrictions Weight Bearing Restrictions: No      Mobility Bed Mobility Overal bed mobility: Needs Assistance Bed Mobility: Supine to Sit, Sit to Supine     Supine to sit: Supervision, HOB elevated Sit to supine: HOB elevated, Mod assist   General bed mobility comments: assist for BLE back to bed    Transfers Overall transfer level: Needs assistance Equipment used: Rolling walker (2 wheels) (bari RW) Transfers: Sit to/from Stand Sit to Stand: Supervision           General transfer comment: three lateral steps up the bed to the right with supervision with RW      Balance Overall balance assessment: Needs assistance Sitting-balance support: No upper extremity supported, Feet supported Sitting balance-Leahy Scale: Good     Standing balance support: Bilateral upper extremity supported, During functional activity, Reliant on assistive device for balance Standing balance-Leahy Scale: Fair                             ADL either performed or assessed with clinical judgement   ADL Overall ADL's : Needs assistance/impaired Eating/Feeding: Set up   Grooming: Wash/dry  hands;Wash/dry face;Sitting;Set up           Upper Body Dressing : Moderate assistance   Lower Body Dressing: Maximal assistance       Toileting- Clothing Manipulation and Hygiene: Maximal assistance               Vision Patient Visual Report: No change from  baseline       Perception     Praxis      Pertinent Vitals/Pain Pain Assessment Pain Assessment: Faces Faces Pain Scale: Hurts a little bit Pain Location: buttocks Pain Descriptors / Indicators: Discomfort Pain Intervention(s): Limited activity within patient's tolerance, Monitored during session, Repositioned     Hand Dominance     Extremity/Trunk Assessment Upper Extremity Assessment Upper Extremity Assessment: Overall WFL for tasks assessed   Lower Extremity Assessment Lower Extremity Assessment: Generalized weakness       Communication Communication Communication: No difficulties   Cognition Arousal/Alertness: Awake/alert Behavior During Therapy: WFL for tasks assessed/performed Overall Cognitive Status: Within Functional Limits for tasks assessed                                       General Comments  spo2 >90% on 3.5 L via [place] throughout session, HR up to 112 with brief STS and lateral steps; significant dry skin noted directly above sacrum. Poor tolerance for activity.    Exercises Other Exercises Other Exercises: edu re: role of OT, role of rehab, discharge recommendations, home safety, falls prevention, DME use   Shoulder Instructions      Home Living Family/patient expects to be discharged to:: Private residence Living Arrangements: Spouse/significant other;Children Available Help at Discharge: Family;Friend(s);Available 24 hours/day Type of Home: House Home Access: Stairs to enter [employer] of Steps: 4 Entrance Stairs-Rails: Right;Left;Can reach both Home Layout: One level     Bathroom Shower/Tub: Teacher, early years/pre: Standard Bathroom Accessibility: No   Home Equipment: Civil engineer, contracting;Wheelchair - Producer, television/film/video (2 wheels);BSC/3in1   Additional Comments: chronic 3.5 L via [place] at home      Prior Functioning/Environment Prior Level of Function : Needs assist       Physical Assist  : Mobility (physical);ADLs (physical) Mobility (physical): Gait;Transfers;Stairs ADLs (physical): Dressing;Bathing;IADLs Mobility Comments: amb very short household distances, has a power and mwc for longer, community ditsances ADLs Comments: sponge baths        OT Problem List: Decreased strength;Decreased activity tolerance;Impaired balance (sitting and/or standing);Decreased safety awareness;Decreased knowledge of precautions;Decreased knowledge of use of DME or AE      OT Treatment/Interventions: Self-care/ADL training;Patient/family education;Therapeutic exercise;Balance training;DME and/or AE instruction;Therapeutic activities    OT Goals(Current goals can be found in the care plan section) Acute Rehab OT Goals Patient Stated Goal: go home OT Goal Formulation: With patient Time For Goal Achievement: 01/25/23 Potential to Achieve Goals: Good ADL Goals Pt Will Perform Grooming: with supervision;sitting Pt Will Perform Lower Body Dressing: with min assist;sitting/lateral leans Pt Will Transfer to Toilet: with min assist Pt Will Perform Toileting - Clothing Manipulation and hygiene: with min assist  OT Frequency: Min 2X/week    Co-evaluation              AM-PAC OT "6 Clicks" Daily Activity     Outcome Measure Help from another person eating meals?: None Help from another person taking care of personal grooming?: None Help from another person toileting, which includes using toliet, bedpan,  or urinal?: A Lot Help from another person bathing (including washing, rinsing, drying)?: A Lot Help from another person to put on and taking off regular upper body clothing?: A Little Help from another person to put on and taking off regular lower body clothing?: A Lot 6 Click Score: 17   End of Session Equipment Utilized During Treatment: Oxygen (bari RW) Nurse Communication: Mobility status  Activity Tolerance: Patient tolerated treatment well Patient left: in bed;with call  bell/phone within reach;with bed alarm set  OT Visit Diagnosis: Unsteadiness on feet (R26.81);Muscle weakness (generalized) (M62.81)                Time: 6283-1517 OT Time Calculation (min): 15 min Charges:  OT General Charges $OT Visit: 1 Visit OT Evaluation $OT Eval Moderate Complexity: 1 Mod  Shanon Payor, OTD OTR/L  01/11/23, 1:20 PM

## 2023-01-11 NOTE — TOC Transition Note (Signed)
Transition of Care [facility]) - CM/SW Discharge Note   Patient Details  Name: Thomas Tanner MRN: [id number] Date of Birth: 1951/05/06  Transition of Care [facility]) CM/SW Contact:  Gerilyn Pilgrim, LCSW Phone Number: 01/11/2023, 2:24 PM   Clinical Narrative:   Pt has orders to discharge. Orders were added for resumption of care PT/OT/RN. MD states pt will need [facility] home due to oxygen requirement. CSW to arrange [facility] once nurse is ready for EMS to be contacted.     Final next level of care: Home w Home Health Services Barriers to Discharge: Barriers Resolved   Patient Goals and CMS Choice CMS Medicare.gov Compare Post Acute Care list provided to:: Patient Choice offered to / list presented to : Patient  Discharge Placement                  Patient to be transferred to facility by: [facility]      Discharge Plan and Services Additional resources added to the After Visit Summary for     Discharge Planning Services: CM Consult, Medication Assistance Post Acute Care Choice: Home Health                    HH Arranged: PT, OT, RN [facility] Agency: [facility] Date [facility] Agency Contacted: 01/09/23 Time [place]: [place] Representative spoke with at [place]: [place] Determinants of Health ([place]) Interventions SDOH Screenings   Food Insecurity: No Food Insecurity (12/17/2022)  Housing: Low Risk  (12/17/2022)  Transportation Needs: No Transportation Needs (12/17/2022)  Utilities: Not At Risk (12/17/2022)  Alcohol Screen: Low Risk  (10/02/2021)  Depression (PHQ2-9): Low Risk  (10/02/2021)  Physical Activity: Unknown (12/08/2018)  Social Connections: Moderately Integrated (12/08/2018)  Stress: No Stress Concern Present (12/08/2018)  Tobacco Use: High Risk (01/08/2023)     Readmission Risk Interventions     No data to display

## 2023-01-14 LAB — CULTURE, BLOOD (ROUTINE X 2)
Culture: NO GROWTH
Culture: NO GROWTH
Special Requests: ADEQUATE

## 2023-01-16 ENCOUNTER — Telehealth: Payer: Self-pay | Admitting: Nurse Practitioner

## 2023-01-16 ENCOUNTER — Telehealth: Payer: Self-pay

## 2023-01-16 NOTE — Telephone Encounter (Signed)
[facility] home health Thomas Tanner called [phone] for verbal order to see pt for nursing on Monday

## 2023-01-16 NOTE — Telephone Encounter (Signed)
Received order 01/16/2023 from [place]. Gave to [place] for signature.

## 2023-01-17 ENCOUNTER — Telehealth: Payer: Self-pay | Admitting: Nurse Practitioner

## 2023-01-17 ENCOUNTER — Telehealth (INDEPENDENT_AMBULATORY_CARE_PROVIDER_SITE_OTHER): Payer: Medicare Other | Admitting: Nurse Practitioner

## 2023-01-17 ENCOUNTER — Encounter: Payer: Self-pay | Admitting: Nurse Practitioner

## 2023-01-17 VITALS — Resp 16 | Ht 73.0 in | Wt 380.0 lb

## 2023-01-17 DIAGNOSIS — J9611 Chronic respiratory failure with hypoxia: Secondary | ICD-10-CM | POA: Diagnosis not present

## 2023-01-17 DIAGNOSIS — G8929 Other chronic pain: Secondary | ICD-10-CM

## 2023-01-17 DIAGNOSIS — J3089 Other allergic rhinitis: Secondary | ICD-10-CM

## 2023-01-17 DIAGNOSIS — I2602 Saddle embolus of pulmonary artery with acute cor pulmonale: Secondary | ICD-10-CM | POA: Diagnosis not present

## 2023-01-17 DIAGNOSIS — Z09 Encounter for follow-up examination after completed treatment for conditions other than malignant neoplasm: Secondary | ICD-10-CM

## 2023-01-17 DIAGNOSIS — M5441 Lumbago with sciatica, right side: Secondary | ICD-10-CM | POA: Diagnosis not present

## 2023-01-17 MED ORDER — TRAMADOL HCL 50 MG PO TABS: 50.0000 mg | ORAL_TABLET | Freq: Four times a day (QID) | ORAL | 0 refills | Status: DC | PRN

## 2023-01-17 MED ORDER — CETIRIZINE HCL 10 MG PO TABS: 10.0000 mg | ORAL_TABLET | Freq: Every day | ORAL | 3 refills | Status: DC

## 2023-01-17 NOTE — Progress Notes (Signed)
[facility] [address], [address] [place] [address] [facility] Discharge Acute Issues Care Follow Up                                                                        Patient Demographics  Thomas Tanner, is a 72 y.o. male  DOB 05-08-1951  MRN [id number].  Primary MD  Jonetta Osgood, NP  I connected with  Thomas Tanner on 01/20/23 by a video enabled telemedicine application and verified that I am speaking with the correct person using two identifiers.   I discussed the limitations of evaluation and management by telemedicine. The patient expressed understanding and agreed to proceed.   Reason for TCC follow Up - pulmonary embolism   Past Medical History:  Diagnosis Date   Arthritis    CHF (congestive heart failure) (HCC)    COPD (chronic obstructive pulmonary disease) (HCC)    Diabetes ([place])    Hyperlipidemia    Hypertension    Kidney stone    Obesity    Tachycardia    TIA (transient ischemic attack)     Past Surgical History:  Procedure Laterality Date   CORONARY/GRAFT ACUTE MI REVASCULARIZATION N/A 12/08/2018   Procedure: Coronary/Graft Acute MI Revascularization;  Surgeon: Yolonda Kida, MD;  Location: [place] [facility];  Service: Cardiovascular;  Laterality: N/A;   INCISION AND DRAINAGE ABSCESS N/A 05/18/2021   Procedure: INCISION AND DRAINAGE SCROTAL ABSCESS, cystoscopy with urethral dilation, and complex catheter placement;  Surgeon: Billey Co, MD;  Location: [facility];  Service: Urology;  Laterality: N/A;   IRRIGATION AND DEBRIDEMENT ABSCESS N/A 05/20/2021   Procedure: IRRIGATION AND DEBRIDEMENT ABSCESS;  Surgeon: Abbie Sons, MD;  Location: [facility];  Service: Urology;  Laterality: N/A;   IRRIGATION AND DEBRIDEMENT ABSCESS N/A 05/21/2021   Procedure: IRRIGATION AND DEBRIDEMENT ABSCESS;  Surgeon: Abbie Sons, MD;  Location: [facility];  Service: Urology;  Laterality: N/A;   KIDNEY STONE SURGERY     left arm surgery  1963   LEFT HEART CATH AND CORONARY ANGIOGRAPHY N/A 12/08/2018   Procedure: LEFT HEART CATH AND CORONARY ANGIOGRAPHY;  Surgeon: Yolonda Kida, MD;  Location: [place] [facility];  Service: Cardiovascular;  Laterality: N/A;   PULMONARY THROMBECTOMY N/A 08/03/2020   Procedure: PULMONARY THROMBECTOMY / THROMBOLYSIS;  Surgeon: Algernon Huxley, MD;  Location: [place] [facility];  Service: Cardiovascular;  Laterality: N/A;   PULMONARY THROMBECTOMY Bilateral 01/08/2023   Procedure: PULMONARY THROMBECTOMY;  Surgeon: Katha Cabal, MD;  Location: [place] [facility];  Service: Cardiovascular;  Laterality: Bilateral;   RECTAL EXAM UNDER ANESTHESIA N/A 05/20/2021   Procedure: EXAM UNDER ANESTHESIA-Dressing and Change;  Surgeon: Abbie Sons, MD;  Location: [facility];  Service: Urology;  Laterality: N/A;       Recent HPI and Hospital Course  Hospital Course: 72 year old male with past medical history of COPD with chronic respiratory failure on 3.5 L nasal cannula, diastolic heart failure, previous  saddle PE who had stopped his anticoagulation due to affordability as well as morbid obesity who presented to the emergency room on 1/30 with complaints of 3 days of shortness of breath with any kind of activity, difficult to recover from.  (Patient had a recent hospitalization for 6 days, discharged on 1/12, for COPD and pneumonia.) Patient found to be acutely hypoxic requiring 5 L nasal cannula, BNP of 797 and new extensive thrombus with evidence of right ventricular strain.   Vascular surgery was consulted and patient underwent thrombectomy.  Post procedure, patient's oxygenation improving and vascular surgery recommending lifelong anticoagulation.  Oxygenation improving and now treating patient for acute heart failure.   Assessment and Plan: * Pulmonary embolism ([place]) This is patient's  secondary saddle pulmonary embolus.  Status post thrombectomy.  Needs to be on lifelong anticoagulation.  Had stopped taking anticoagulation after last PE secondary to cost.  Patient clarified that he can afford the monthly co-pay $50, but it puts him into the donut hole which makes the whole thing unaffordable.     Pharmacy help patient get enrolled so that after April 1, when enrollment begins, company will contact him.  Outpatient pharmacy will also assist patient with this process.  Starter pack for Xarelto delivered to patient's room.  Additional prescriptions for Xarelto 20 mg once a day after starter pack is finished sent to patient's regular pharmacy at [facility].  If patient enters coverage, he has instructions to call over to outpatient pharmacy for free drug assistance.   Acute on chronic respiratory failure with hypoxia and hypercapnia (HCC) Baseline on 3.5 L.  Acute respiratory failure secondary to PE.  Status post thrombectomy, oxygenation improving.  Also with acute diastolic heart failure.  See below.  Diuresing, and by day of discharge, down to baseline   Acute on chronic diastolic (congestive) heart failure (HCC) Patient's BNP is elevated on admission compared to prior in the setting of submassive PE with RV strain.  No significant lower extremity pitting edema interestingly.     Rechecked BNP post thrombectomy, and was elevated in the 700s, and this number is likely lower than where he truly is given his morbid obesity.  Started on IV Lasix and patient to date has diuresed over 7 L and is -4 L deficient.  He will resume his normal Lasix upon discharge   AKI (acute kidney injury) ([place]) Secondary to decompensated heart failure.  With diuresis, by day of discharge, creatinine at 1.14 with normal GFR   COPD (chronic obstructive pulmonary disease) (HCC) Given severity of COPD, patient will need triple bronchodilators.  It appears he is on DuoNebs only at home.   - Continue DuoNebs as  needed - Start [employer] and [employer]   Morbid obesity ([place]) Meets criteria with BMI greater than [facility] Acute Care Issue to be followed in the Clinic   Principal Problem:   Pulmonary embolism ([place]) Active Problems:   Acute on chronic respiratory failure with hypoxia and hypercapnia (HCC)   Acute on chronic diastolic (congestive) heart failure (HCC)   AKI (acute kidney injury) (HCC)   COPD (chronic obstructive pulmonary disease) (HCC)   Morbid obesity (HCC)   Subjective:   Lonell Grandchild today has, No headache, No chest pain, No abdominal pain - No Nausea, No new weakness tingling or numbness, No Cough - SOB.   Assessment & Plan   1. Hospital discharge follow-up Treated in hospital for pulmonary embolism. Discharged with homehealth and physical therapy.   2.  Acute saddle pulmonary embolism with acute cor pulmonale (HCC) Treated in hospital, discharged on xarelto, requesting assistance with paying for xarelto  3. Chronic respiratory failure with hypoxia (HCC) Stable on supplemental oxygen at 2.5 LPM via [place].   4. Chronic midline low back pain with right-sided sciatica Take tramadol prn as prescribed for chronic low back pain - traMADol (ULTRAM) 50 MG tablet; Take 1 tablet (50 mg total) by mouth every 6 (six) hours as needed.  Dispense: 20 tablet; Refill: 0  5. Seasonal allergic rhinitis due to other allergic trigger Continue cetirizine as prescribed.  - cetirizine (ZYRTEC) 10 MG tablet; Take 1 tablet (10 mg total) by mouth daily.  Dispense: 90 tablet; Refill: 3      Reason for frequent admissions/ER visits **      Objective:   Vitals:   01/17/23 0928  Resp: 16  Weight: (!) 380 lb (172.4 kg)  Height: 6' 1"$  (1.854 m)    Wt Readings from Last 3 Encounters:  01/17/23 (!) 380 lb (172.4 kg)  01/11/23 (!) 381 lb 2.8 oz (172.9 kg)  12/15/22 (!) 392 lb 6.7 oz (178 kg)    Allergies as of 01/17/2023       Reactions   Morphine Anxiety, Other (See  Comments)   Hallucinations Pt states 6.17.22 that he is not allergic to this medication   Azithromycin Itching   Localized pruritus on arm with IV   Morphine And Related Anxiety        Medication List        Accurate as of January 17, 2023 11:59 PM. If you have any questions, ask your nurse or doctor.          albuterol 108 (90 Base) MCG/ACT inhaler Commonly known as: Ventolin HFA Inhale 2 puffs into the lungs every 6 (six) hours as needed for wheezing or shortness of breath.   ALPRAZolam 0.25 MG tablet Commonly known as: XANAX Take 1 tablet (0.25 mg total) by mouth daily as needed (severe anxiety). May take 1 additional dose after 2 hours if the first dose is not effective.   cetirizine 10 MG tablet Commonly known as: ZYRTEC Take 1 tablet (10 mg total) by mouth daily.   cyanocobalamin 1000 MCG tablet Take 1 tablet (1,000 mcg total) by mouth daily.   famotidine 20 MG tablet Commonly known as: PEPCID Take 1 tablet (20 mg total) by mouth 2 (two) times daily.   furosemide 40 MG tablet Commonly known as: Lasix Take 1 tablet (40 mg total) by mouth daily.   hydrOXYzine 25 MG tablet Commonly known as: ATARAX Take 1 tablet (25 mg total) by mouth 3 (three) times daily as needed for itching.   ipratropium-albuterol 0.5-2.5 (3) MG/3ML Soln Commonly known as: DUONEB Take 3 mLs by nebulization every 6 (six) hours as needed.   iron polysaccharides 150 MG capsule Commonly known as: NIFEREX Take 1 capsule (150 mg total) by mouth daily.   lansoprazole 30 MG capsule Commonly known as: PREVACID Take 1 capsule (30 mg total) by mouth daily before breakfast.   metoprolol succinate 50 MG 24 hr tablet Commonly known as: TOPROL-XL Take 1 tablet (50 mg total) by mouth daily. Take with or immediately following a meal.   OXYGEN Inhale 2.5 L into the lungs. [place] PATIENT   phenylephrine-shark liver oil-mineral oil-petrolatum 0.25-14-74.9 %  rectal ointment Commonly known as: PREPARATION H Place 1 application rectally 2 (two) times daily as needed for hemorrhoids.   rosuvastatin  20 MG tablet Commonly known as: CRESTOR Take 1 tablet (20 mg total) by mouth daily.   senna-docusate 8.6-50 MG tablet Commonly known as: Senokot-S Take 1 tablet by mouth 2 (two) times daily.   traMADol 50 MG tablet Commonly known as: ULTRAM Take 1 tablet (50 mg total) by mouth every 6 (six) hours as needed. Started by: Jonetta Osgood, NP   Xarelto Starter Pack Generic drug: Rivaroxaban Stater Pack (15 mg and 20 mg) Follow package directions: Take one 67m tablet by mouth twice a day. On day 22, switch to one 277mtablet once a day. Take with food.   rivaroxaban 20 MG Tabs tablet Commonly known as: XARELTO Take 1 tablet (20 mg total) by mouth daily with supper. Start taking on: February 08, 2023         Physical Exam: He is alert, oriented and engages in conversation appropriately. No acute distress noted.   Data Review   Micro Results No results found for this or any previous visit (from the past 240 hour(s)).    CBC No results for input(s): "WBC", "HGB", "HCT", "PLT", "MCV", "MCH", "MCHC", "RDW", "LYMPHSABS", "MONOABS", "EOSABS", "BASOSABS", "BANDABS" in the last 168 hours.  Invalid input(s): "NEUTRABS", "BANDSABD"   Chemistries  No results for input(s): "NA", "K", "CL", "CO2", "GLUCOSE", "BUN", "CREATININE", "CALCIUM", "MG", "AST", "ALT", "ALKPHOS", "BILITOT" in the last 168 hours.  Invalid input(s): "GFRCGP"  ------------------------------------------------------------------------------------------------------------------ estimated creatinine clearance is 99.7 mL/min (by C-G formula based on SCr of 1.14 mg/dL). ------------------------------------------------------------------------------------------------------------------ No results for input(s): "HGBA1C" in the last 72  hours. ------------------------------------------------------------------------------------------------------------------ No results for input(s): "CHOL", "HDL", "LDLCALC", "TRIG", "CHOLHDL", "LDLDIRECT" in the last 72 hours. ------------------------------------------------------------------------------------------------------------------ No results for input(s): "TSH", "T4TOTAL", "T3FREE", "THYROIDAB" in the last 72 hours.  Invalid input(s): "FREET3" ------------------------------------------------------------------------------------------------------------------ No results for input(s): "VITAMINB12", "FOLATE", "FERRITIN", "TIBC", "IRON", "RETICCTPCT" in the last 72 hours.  Coagulation profile No results for input(s): "INR", "PROTIME" in the last 168 hours.  No results for input(s): "DDIMER" in the last 72 hours.  Cardiac Enzymes No results for input(s): "CKMB", "TROPONINI", "MYOGLOBIN" in the last 168 hours.  Invalid input(s): "CK" ------------------------------------------------------------------------------------------------------------------ Invalid input(s): "POCBNP"  Return in about 3 months (around 04/17/2023) for F/U, AlCalabashCP.   Time Spent in minutes  45 Time spent with patient included reviewing progress notes, labs, imaging studies, and discussing plan for follow up.   This patient was seen by AlJonetta OsgoodFNP-C in collaboration with Dr. FoClayborn Bignesss a part of collaborative care agreement.    AlJonetta OsgoodSN, FNP-C on 01/17/2023 at 9:53 AM   **Disclaimer: This note may have been dictated with voice recognition software. Similar sounding words can inadvertently be transcribed and this note may contain transcription errors which may not have been corrected upon publication of note.**

## 2023-01-17 NOTE — Telephone Encounter (Signed)
[facility] order signed. Faxed back; [phone]. To be scanned-nm

## 2023-01-20 ENCOUNTER — Encounter: Payer: Self-pay | Admitting: Nurse Practitioner

## 2023-01-20 LAB — BLOOD GAS, VENOUS
Acid-Base Excess: 7.2 mmol/L — ABNORMAL HIGH (ref 0.0–2.0)
Bicarbonate: 33.1 mmol/L — ABNORMAL HIGH (ref 20.0–28.0)
O2 Saturation: 47.7 %
Patient temperature: 37
pCO2, Ven: 51 mmHg (ref 44–60)
pH, Ven: 7.42 (ref 7.25–7.43)
pO2, Ven: 34 mmHg (ref 32–45)

## 2023-01-22 DIAGNOSIS — K219 Gastro-esophageal reflux disease without esophagitis: Secondary | ICD-10-CM | POA: Diagnosis not present

## 2023-01-22 DIAGNOSIS — I130 Hypertensive heart and chronic kidney disease with heart failure and stage 1 through stage 4 chronic kidney disease, or unspecified chronic kidney disease: Secondary | ICD-10-CM | POA: Diagnosis not present

## 2023-01-22 DIAGNOSIS — I700 Atherosclerosis of aorta: Secondary | ICD-10-CM | POA: Diagnosis not present

## 2023-01-22 DIAGNOSIS — J441 Chronic obstructive pulmonary disease with (acute) exacerbation: Secondary | ICD-10-CM | POA: Diagnosis not present

## 2023-01-22 DIAGNOSIS — J9622 Acute and chronic respiratory failure with hypercapnia: Secondary | ICD-10-CM | POA: Diagnosis not present

## 2023-01-22 DIAGNOSIS — J440 Chronic obstructive pulmonary disease with acute lower respiratory infection: Secondary | ICD-10-CM | POA: Diagnosis not present

## 2023-01-22 DIAGNOSIS — J432 Centrilobular emphysema: Secondary | ICD-10-CM | POA: Diagnosis not present

## 2023-01-22 DIAGNOSIS — M1990 Unspecified osteoarthritis, unspecified site: Secondary | ICD-10-CM | POA: Diagnosis not present

## 2023-01-22 DIAGNOSIS — J4520 Mild intermittent asthma, uncomplicated: Secondary | ICD-10-CM | POA: Diagnosis not present

## 2023-01-22 DIAGNOSIS — E875 Hyperkalemia: Secondary | ICD-10-CM | POA: Diagnosis not present

## 2023-01-22 DIAGNOSIS — I5033 Acute on chronic diastolic (congestive) heart failure: Secondary | ICD-10-CM | POA: Diagnosis not present

## 2023-01-22 DIAGNOSIS — E1122 Type 2 diabetes mellitus with diabetic chronic kidney disease: Secondary | ICD-10-CM | POA: Diagnosis not present

## 2023-01-22 DIAGNOSIS — N1831 Chronic kidney disease, stage 3a: Secondary | ICD-10-CM | POA: Diagnosis not present

## 2023-01-22 DIAGNOSIS — R532 Functional quadriplegia: Secondary | ICD-10-CM | POA: Diagnosis not present

## 2023-01-22 DIAGNOSIS — J9621 Acute and chronic respiratory failure with hypoxia: Secondary | ICD-10-CM | POA: Diagnosis not present

## 2023-01-22 DIAGNOSIS — Z7901 Long term (current) use of anticoagulants: Secondary | ICD-10-CM | POA: Diagnosis not present

## 2023-01-22 DIAGNOSIS — I2510 Atherosclerotic heart disease of native coronary artery without angina pectoris: Secondary | ICD-10-CM | POA: Diagnosis not present

## 2023-01-22 DIAGNOSIS — G8929 Other chronic pain: Secondary | ICD-10-CM | POA: Diagnosis not present

## 2023-01-22 DIAGNOSIS — I2699 Other pulmonary embolism without acute cor pulmonale: Secondary | ICD-10-CM | POA: Diagnosis not present

## 2023-01-22 DIAGNOSIS — M5441 Lumbago with sciatica, right side: Secondary | ICD-10-CM | POA: Diagnosis not present

## 2023-01-22 DIAGNOSIS — E785 Hyperlipidemia, unspecified: Secondary | ICD-10-CM | POA: Diagnosis not present

## 2023-01-22 DIAGNOSIS — N179 Acute kidney failure, unspecified: Secondary | ICD-10-CM | POA: Diagnosis not present

## 2023-01-23 ENCOUNTER — Telehealth: Payer: Self-pay

## 2023-01-23 DIAGNOSIS — N179 Acute kidney failure, unspecified: Secondary | ICD-10-CM | POA: Diagnosis not present

## 2023-01-23 DIAGNOSIS — E1122 Type 2 diabetes mellitus with diabetic chronic kidney disease: Secondary | ICD-10-CM | POA: Diagnosis not present

## 2023-01-23 DIAGNOSIS — I130 Hypertensive heart and chronic kidney disease with heart failure and stage 1 through stage 4 chronic kidney disease, or unspecified chronic kidney disease: Secondary | ICD-10-CM | POA: Diagnosis not present

## 2023-01-23 DIAGNOSIS — J9622 Acute and chronic respiratory failure with hypercapnia: Secondary | ICD-10-CM | POA: Diagnosis not present

## 2023-01-23 DIAGNOSIS — Z7901 Long term (current) use of anticoagulants: Secondary | ICD-10-CM | POA: Diagnosis not present

## 2023-01-23 DIAGNOSIS — M1990 Unspecified osteoarthritis, unspecified site: Secondary | ICD-10-CM | POA: Diagnosis not present

## 2023-01-23 DIAGNOSIS — J4520 Mild intermittent asthma, uncomplicated: Secondary | ICD-10-CM | POA: Diagnosis not present

## 2023-01-23 DIAGNOSIS — E785 Hyperlipidemia, unspecified: Secondary | ICD-10-CM | POA: Diagnosis not present

## 2023-01-23 DIAGNOSIS — G8929 Other chronic pain: Secondary | ICD-10-CM | POA: Diagnosis not present

## 2023-01-23 DIAGNOSIS — R532 Functional quadriplegia: Secondary | ICD-10-CM | POA: Diagnosis not present

## 2023-01-23 DIAGNOSIS — J9621 Acute and chronic respiratory failure with hypoxia: Secondary | ICD-10-CM | POA: Diagnosis not present

## 2023-01-23 DIAGNOSIS — J441 Chronic obstructive pulmonary disease with (acute) exacerbation: Secondary | ICD-10-CM | POA: Diagnosis not present

## 2023-01-23 DIAGNOSIS — N1831 Chronic kidney disease, stage 3a: Secondary | ICD-10-CM | POA: Diagnosis not present

## 2023-01-23 DIAGNOSIS — I5033 Acute on chronic diastolic (congestive) heart failure: Secondary | ICD-10-CM | POA: Diagnosis not present

## 2023-01-23 DIAGNOSIS — M5441 Lumbago with sciatica, right side: Secondary | ICD-10-CM | POA: Diagnosis not present

## 2023-01-23 DIAGNOSIS — I2699 Other pulmonary embolism without acute cor pulmonale: Secondary | ICD-10-CM | POA: Diagnosis not present

## 2023-01-23 DIAGNOSIS — K219 Gastro-esophageal reflux disease without esophagitis: Secondary | ICD-10-CM | POA: Diagnosis not present

## 2023-01-23 DIAGNOSIS — J432 Centrilobular emphysema: Secondary | ICD-10-CM | POA: Diagnosis not present

## 2023-01-23 DIAGNOSIS — I2510 Atherosclerotic heart disease of native coronary artery without angina pectoris: Secondary | ICD-10-CM | POA: Diagnosis not present

## 2023-01-23 DIAGNOSIS — E875 Hyperkalemia: Secondary | ICD-10-CM | POA: Diagnosis not present

## 2023-01-23 DIAGNOSIS — I700 Atherosclerosis of aorta: Secondary | ICD-10-CM | POA: Diagnosis not present

## 2023-01-23 DIAGNOSIS — J440 Chronic obstructive pulmonary disease with acute lower respiratory infection: Secondary | ICD-10-CM | POA: Diagnosis not present

## 2023-01-23 NOTE — Telephone Encounter (Signed)
Gave verbal order to Madison Hickman [phone]  for PT once a week for 8 week and also gave verbal order for involve social worker

## 2023-01-25 ENCOUNTER — Telehealth: Payer: Self-pay

## 2023-01-25 DIAGNOSIS — J432 Centrilobular emphysema: Secondary | ICD-10-CM | POA: Diagnosis not present

## 2023-01-25 DIAGNOSIS — E1122 Type 2 diabetes mellitus with diabetic chronic kidney disease: Secondary | ICD-10-CM | POA: Diagnosis not present

## 2023-01-25 DIAGNOSIS — E875 Hyperkalemia: Secondary | ICD-10-CM | POA: Diagnosis not present

## 2023-01-25 DIAGNOSIS — M1990 Unspecified osteoarthritis, unspecified site: Secondary | ICD-10-CM | POA: Diagnosis not present

## 2023-01-25 DIAGNOSIS — J9622 Acute and chronic respiratory failure with hypercapnia: Secondary | ICD-10-CM | POA: Diagnosis not present

## 2023-01-25 DIAGNOSIS — G8929 Other chronic pain: Secondary | ICD-10-CM | POA: Diagnosis not present

## 2023-01-25 DIAGNOSIS — J9621 Acute and chronic respiratory failure with hypoxia: Secondary | ICD-10-CM | POA: Diagnosis not present

## 2023-01-25 DIAGNOSIS — J441 Chronic obstructive pulmonary disease with (acute) exacerbation: Secondary | ICD-10-CM | POA: Diagnosis not present

## 2023-01-25 DIAGNOSIS — I2699 Other pulmonary embolism without acute cor pulmonale: Secondary | ICD-10-CM | POA: Diagnosis not present

## 2023-01-25 DIAGNOSIS — J4520 Mild intermittent asthma, uncomplicated: Secondary | ICD-10-CM | POA: Diagnosis not present

## 2023-01-25 DIAGNOSIS — Z7901 Long term (current) use of anticoagulants: Secondary | ICD-10-CM | POA: Diagnosis not present

## 2023-01-25 DIAGNOSIS — I2510 Atherosclerotic heart disease of native coronary artery without angina pectoris: Secondary | ICD-10-CM | POA: Diagnosis not present

## 2023-01-25 DIAGNOSIS — R532 Functional quadriplegia: Secondary | ICD-10-CM | POA: Diagnosis not present

## 2023-01-25 DIAGNOSIS — N1831 Chronic kidney disease, stage 3a: Secondary | ICD-10-CM | POA: Diagnosis not present

## 2023-01-25 DIAGNOSIS — J440 Chronic obstructive pulmonary disease with acute lower respiratory infection: Secondary | ICD-10-CM | POA: Diagnosis not present

## 2023-01-25 DIAGNOSIS — I130 Hypertensive heart and chronic kidney disease with heart failure and stage 1 through stage 4 chronic kidney disease, or unspecified chronic kidney disease: Secondary | ICD-10-CM | POA: Diagnosis not present

## 2023-01-25 DIAGNOSIS — K219 Gastro-esophageal reflux disease without esophagitis: Secondary | ICD-10-CM | POA: Diagnosis not present

## 2023-01-25 DIAGNOSIS — E785 Hyperlipidemia, unspecified: Secondary | ICD-10-CM | POA: Diagnosis not present

## 2023-01-25 DIAGNOSIS — I5033 Acute on chronic diastolic (congestive) heart failure: Secondary | ICD-10-CM | POA: Diagnosis not present

## 2023-01-25 DIAGNOSIS — M5441 Lumbago with sciatica, right side: Secondary | ICD-10-CM | POA: Diagnosis not present

## 2023-01-25 DIAGNOSIS — N179 Acute kidney failure, unspecified: Secondary | ICD-10-CM | POA: Diagnosis not present

## 2023-01-25 DIAGNOSIS — I700 Atherosclerosis of aorta: Secondary | ICD-10-CM | POA: Diagnosis not present

## 2023-01-25 NOTE — Telephone Encounter (Signed)
Gave verbal order to welfare for occupational therapy [id number]  1 times a week for 4 week and  1 times a week every other week

## 2023-01-28 ENCOUNTER — Telehealth: Payer: Self-pay | Admitting: Nurse Practitioner

## 2023-01-28 NOTE — Telephone Encounter (Signed)
[facility] order signed. Faxed back; [phone]. To be scanned-nm

## 2023-01-28 NOTE — Telephone Encounter (Signed)
Received order from [facility]. Gave to [place] for signature-nm

## 2023-01-29 DIAGNOSIS — R532 Functional quadriplegia: Secondary | ICD-10-CM | POA: Diagnosis not present

## 2023-01-29 DIAGNOSIS — Z7901 Long term (current) use of anticoagulants: Secondary | ICD-10-CM | POA: Diagnosis not present

## 2023-01-29 DIAGNOSIS — J432 Centrilobular emphysema: Secondary | ICD-10-CM | POA: Diagnosis not present

## 2023-01-29 DIAGNOSIS — J441 Chronic obstructive pulmonary disease with (acute) exacerbation: Secondary | ICD-10-CM | POA: Diagnosis not present

## 2023-01-29 DIAGNOSIS — K219 Gastro-esophageal reflux disease without esophagitis: Secondary | ICD-10-CM | POA: Diagnosis not present

## 2023-01-29 DIAGNOSIS — J9621 Acute and chronic respiratory failure with hypoxia: Secondary | ICD-10-CM | POA: Diagnosis not present

## 2023-01-29 DIAGNOSIS — I2699 Other pulmonary embolism without acute cor pulmonale: Secondary | ICD-10-CM | POA: Diagnosis not present

## 2023-01-29 DIAGNOSIS — J9622 Acute and chronic respiratory failure with hypercapnia: Secondary | ICD-10-CM | POA: Diagnosis not present

## 2023-01-29 DIAGNOSIS — E875 Hyperkalemia: Secondary | ICD-10-CM | POA: Diagnosis not present

## 2023-01-29 DIAGNOSIS — M1990 Unspecified osteoarthritis, unspecified site: Secondary | ICD-10-CM | POA: Diagnosis not present

## 2023-01-29 DIAGNOSIS — G8929 Other chronic pain: Secondary | ICD-10-CM | POA: Diagnosis not present

## 2023-01-29 DIAGNOSIS — E1122 Type 2 diabetes mellitus with diabetic chronic kidney disease: Secondary | ICD-10-CM | POA: Diagnosis not present

## 2023-01-29 DIAGNOSIS — I130 Hypertensive heart and chronic kidney disease with heart failure and stage 1 through stage 4 chronic kidney disease, or unspecified chronic kidney disease: Secondary | ICD-10-CM | POA: Diagnosis not present

## 2023-01-29 DIAGNOSIS — E785 Hyperlipidemia, unspecified: Secondary | ICD-10-CM | POA: Diagnosis not present

## 2023-01-29 DIAGNOSIS — J4520 Mild intermittent asthma, uncomplicated: Secondary | ICD-10-CM | POA: Diagnosis not present

## 2023-01-29 DIAGNOSIS — I2510 Atherosclerotic heart disease of native coronary artery without angina pectoris: Secondary | ICD-10-CM | POA: Diagnosis not present

## 2023-01-29 DIAGNOSIS — N179 Acute kidney failure, unspecified: Secondary | ICD-10-CM | POA: Diagnosis not present

## 2023-01-29 DIAGNOSIS — I5033 Acute on chronic diastolic (congestive) heart failure: Secondary | ICD-10-CM | POA: Diagnosis not present

## 2023-01-29 DIAGNOSIS — N1831 Chronic kidney disease, stage 3a: Secondary | ICD-10-CM | POA: Diagnosis not present

## 2023-01-29 DIAGNOSIS — M5441 Lumbago with sciatica, right side: Secondary | ICD-10-CM | POA: Diagnosis not present

## 2023-01-29 DIAGNOSIS — J440 Chronic obstructive pulmonary disease with acute lower respiratory infection: Secondary | ICD-10-CM | POA: Diagnosis not present

## 2023-01-29 DIAGNOSIS — I700 Atherosclerosis of aorta: Secondary | ICD-10-CM | POA: Diagnosis not present

## 2023-01-31 ENCOUNTER — Other Ambulatory Visit: Payer: Self-pay

## 2023-01-31 ENCOUNTER — Telehealth: Payer: Self-pay

## 2023-01-31 DIAGNOSIS — R532 Functional quadriplegia: Secondary | ICD-10-CM | POA: Diagnosis not present

## 2023-01-31 DIAGNOSIS — K219 Gastro-esophageal reflux disease without esophagitis: Secondary | ICD-10-CM | POA: Diagnosis not present

## 2023-01-31 DIAGNOSIS — I2699 Other pulmonary embolism without acute cor pulmonale: Secondary | ICD-10-CM | POA: Diagnosis not present

## 2023-01-31 DIAGNOSIS — I5033 Acute on chronic diastolic (congestive) heart failure: Secondary | ICD-10-CM | POA: Diagnosis not present

## 2023-01-31 DIAGNOSIS — M5441 Lumbago with sciatica, right side: Secondary | ICD-10-CM | POA: Diagnosis not present

## 2023-01-31 DIAGNOSIS — I700 Atherosclerosis of aorta: Secondary | ICD-10-CM | POA: Diagnosis not present

## 2023-01-31 DIAGNOSIS — N179 Acute kidney failure, unspecified: Secondary | ICD-10-CM | POA: Diagnosis not present

## 2023-01-31 DIAGNOSIS — G8929 Other chronic pain: Secondary | ICD-10-CM | POA: Diagnosis not present

## 2023-01-31 DIAGNOSIS — E875 Hyperkalemia: Secondary | ICD-10-CM | POA: Diagnosis not present

## 2023-01-31 DIAGNOSIS — E785 Hyperlipidemia, unspecified: Secondary | ICD-10-CM | POA: Diagnosis not present

## 2023-01-31 DIAGNOSIS — J440 Chronic obstructive pulmonary disease with acute lower respiratory infection: Secondary | ICD-10-CM | POA: Diagnosis not present

## 2023-01-31 DIAGNOSIS — N1831 Chronic kidney disease, stage 3a: Secondary | ICD-10-CM | POA: Diagnosis not present

## 2023-01-31 DIAGNOSIS — I2510 Atherosclerotic heart disease of native coronary artery without angina pectoris: Secondary | ICD-10-CM | POA: Diagnosis not present

## 2023-01-31 DIAGNOSIS — J9622 Acute and chronic respiratory failure with hypercapnia: Secondary | ICD-10-CM | POA: Diagnosis not present

## 2023-01-31 DIAGNOSIS — J9621 Acute and chronic respiratory failure with hypoxia: Secondary | ICD-10-CM | POA: Diagnosis not present

## 2023-01-31 DIAGNOSIS — Z7901 Long term (current) use of anticoagulants: Secondary | ICD-10-CM | POA: Diagnosis not present

## 2023-01-31 DIAGNOSIS — M1990 Unspecified osteoarthritis, unspecified site: Secondary | ICD-10-CM | POA: Diagnosis not present

## 2023-01-31 DIAGNOSIS — J4520 Mild intermittent asthma, uncomplicated: Secondary | ICD-10-CM | POA: Diagnosis not present

## 2023-01-31 DIAGNOSIS — I130 Hypertensive heart and chronic kidney disease with heart failure and stage 1 through stage 4 chronic kidney disease, or unspecified chronic kidney disease: Secondary | ICD-10-CM | POA: Diagnosis not present

## 2023-01-31 DIAGNOSIS — J441 Chronic obstructive pulmonary disease with (acute) exacerbation: Secondary | ICD-10-CM | POA: Diagnosis not present

## 2023-01-31 DIAGNOSIS — E1122 Type 2 diabetes mellitus with diabetic chronic kidney disease: Secondary | ICD-10-CM | POA: Diagnosis not present

## 2023-01-31 DIAGNOSIS — J432 Centrilobular emphysema: Secondary | ICD-10-CM | POA: Diagnosis not present

## 2023-01-31 NOTE — Telephone Encounter (Signed)
Patient called to see if [facility] 43[facility] can be cheaper [facility] I tried to complete a prior authorization, but the $190 is the price after insurance. Otherwise, it is $600 without insurance. I will ask Alyssa if there are cheaper options and let patient know.

## 2023-02-01 ENCOUNTER — Telehealth: Payer: Self-pay

## 2023-02-01 DIAGNOSIS — R532 Functional quadriplegia: Secondary | ICD-10-CM | POA: Diagnosis not present

## 2023-02-01 DIAGNOSIS — Z7901 Long term (current) use of anticoagulants: Secondary | ICD-10-CM | POA: Diagnosis not present

## 2023-02-01 DIAGNOSIS — M1990 Unspecified osteoarthritis, unspecified site: Secondary | ICD-10-CM | POA: Diagnosis not present

## 2023-02-01 DIAGNOSIS — E875 Hyperkalemia: Secondary | ICD-10-CM | POA: Diagnosis not present

## 2023-02-01 DIAGNOSIS — J4520 Mild intermittent asthma, uncomplicated: Secondary | ICD-10-CM | POA: Diagnosis not present

## 2023-02-01 DIAGNOSIS — J432 Centrilobular emphysema: Secondary | ICD-10-CM | POA: Diagnosis not present

## 2023-02-01 DIAGNOSIS — I130 Hypertensive heart and chronic kidney disease with heart failure and stage 1 through stage 4 chronic kidney disease, or unspecified chronic kidney disease: Secondary | ICD-10-CM | POA: Diagnosis not present

## 2023-02-01 DIAGNOSIS — I2510 Atherosclerotic heart disease of native coronary artery without angina pectoris: Secondary | ICD-10-CM | POA: Diagnosis not present

## 2023-02-01 DIAGNOSIS — M5441 Lumbago with sciatica, right side: Secondary | ICD-10-CM | POA: Diagnosis not present

## 2023-02-01 DIAGNOSIS — N179 Acute kidney failure, unspecified: Secondary | ICD-10-CM | POA: Diagnosis not present

## 2023-02-01 DIAGNOSIS — E1122 Type 2 diabetes mellitus with diabetic chronic kidney disease: Secondary | ICD-10-CM | POA: Diagnosis not present

## 2023-02-01 DIAGNOSIS — J9622 Acute and chronic respiratory failure with hypercapnia: Secondary | ICD-10-CM | POA: Diagnosis not present

## 2023-02-01 DIAGNOSIS — J449 Chronic obstructive pulmonary disease, unspecified: Secondary | ICD-10-CM | POA: Diagnosis not present

## 2023-02-01 DIAGNOSIS — J9621 Acute and chronic respiratory failure with hypoxia: Secondary | ICD-10-CM | POA: Diagnosis not present

## 2023-02-01 DIAGNOSIS — N1831 Chronic kidney disease, stage 3a: Secondary | ICD-10-CM | POA: Diagnosis not present

## 2023-02-01 DIAGNOSIS — J440 Chronic obstructive pulmonary disease with acute lower respiratory infection: Secondary | ICD-10-CM | POA: Diagnosis not present

## 2023-02-01 DIAGNOSIS — G8929 Other chronic pain: Secondary | ICD-10-CM | POA: Diagnosis not present

## 2023-02-01 DIAGNOSIS — I700 Atherosclerosis of aorta: Secondary | ICD-10-CM | POA: Diagnosis not present

## 2023-02-01 DIAGNOSIS — I2699 Other pulmonary embolism without acute cor pulmonale: Secondary | ICD-10-CM | POA: Diagnosis not present

## 2023-02-01 DIAGNOSIS — E785 Hyperlipidemia, unspecified: Secondary | ICD-10-CM | POA: Diagnosis not present

## 2023-02-01 DIAGNOSIS — I5033 Acute on chronic diastolic (congestive) heart failure: Secondary | ICD-10-CM | POA: Diagnosis not present

## 2023-02-01 DIAGNOSIS — J441 Chronic obstructive pulmonary disease with (acute) exacerbation: Secondary | ICD-10-CM | POA: Diagnosis not present

## 2023-02-01 DIAGNOSIS — K219 Gastro-esophageal reflux disease without esophagitis: Secondary | ICD-10-CM | POA: Diagnosis not present

## 2023-02-01 NOTE — Telephone Encounter (Signed)
Gave verbal order to social worker to go 1 more time next Friday [id number]

## 2023-02-01 NOTE — Telephone Encounter (Signed)
We will do patient assistance for patient's Eliquis. I have set aside a month's worth of samples and patient assistance paperwork to be picked up on Monday, 2/26.

## 2023-02-04 ENCOUNTER — Telehealth: Payer: Self-pay | Admitting: Nurse Practitioner

## 2023-02-04 DIAGNOSIS — I2699 Other pulmonary embolism without acute cor pulmonale: Secondary | ICD-10-CM | POA: Diagnosis not present

## 2023-02-04 DIAGNOSIS — M5441 Lumbago with sciatica, right side: Secondary | ICD-10-CM | POA: Diagnosis not present

## 2023-02-04 DIAGNOSIS — G8929 Other chronic pain: Secondary | ICD-10-CM | POA: Diagnosis not present

## 2023-02-04 DIAGNOSIS — I2510 Atherosclerotic heart disease of native coronary artery without angina pectoris: Secondary | ICD-10-CM | POA: Diagnosis not present

## 2023-02-04 DIAGNOSIS — E785 Hyperlipidemia, unspecified: Secondary | ICD-10-CM | POA: Diagnosis not present

## 2023-02-04 DIAGNOSIS — E875 Hyperkalemia: Secondary | ICD-10-CM | POA: Diagnosis not present

## 2023-02-04 DIAGNOSIS — I5033 Acute on chronic diastolic (congestive) heart failure: Secondary | ICD-10-CM | POA: Diagnosis not present

## 2023-02-04 DIAGNOSIS — Z7901 Long term (current) use of anticoagulants: Secondary | ICD-10-CM | POA: Diagnosis not present

## 2023-02-04 DIAGNOSIS — J440 Chronic obstructive pulmonary disease with acute lower respiratory infection: Secondary | ICD-10-CM | POA: Diagnosis not present

## 2023-02-04 DIAGNOSIS — K219 Gastro-esophageal reflux disease without esophagitis: Secondary | ICD-10-CM | POA: Diagnosis not present

## 2023-02-04 DIAGNOSIS — N179 Acute kidney failure, unspecified: Secondary | ICD-10-CM | POA: Diagnosis not present

## 2023-02-04 DIAGNOSIS — J441 Chronic obstructive pulmonary disease with (acute) exacerbation: Secondary | ICD-10-CM | POA: Diagnosis not present

## 2023-02-04 DIAGNOSIS — E1122 Type 2 diabetes mellitus with diabetic chronic kidney disease: Secondary | ICD-10-CM | POA: Diagnosis not present

## 2023-02-04 DIAGNOSIS — J9621 Acute and chronic respiratory failure with hypoxia: Secondary | ICD-10-CM | POA: Diagnosis not present

## 2023-02-04 DIAGNOSIS — J432 Centrilobular emphysema: Secondary | ICD-10-CM | POA: Diagnosis not present

## 2023-02-04 DIAGNOSIS — M1990 Unspecified osteoarthritis, unspecified site: Secondary | ICD-10-CM | POA: Diagnosis not present

## 2023-02-04 DIAGNOSIS — J4520 Mild intermittent asthma, uncomplicated: Secondary | ICD-10-CM | POA: Diagnosis not present

## 2023-02-04 DIAGNOSIS — I130 Hypertensive heart and chronic kidney disease with heart failure and stage 1 through stage 4 chronic kidney disease, or unspecified chronic kidney disease: Secondary | ICD-10-CM | POA: Diagnosis not present

## 2023-02-04 DIAGNOSIS — N1831 Chronic kidney disease, stage 3a: Secondary | ICD-10-CM | POA: Diagnosis not present

## 2023-02-04 DIAGNOSIS — I700 Atherosclerosis of aorta: Secondary | ICD-10-CM | POA: Diagnosis not present

## 2023-02-04 DIAGNOSIS — J9622 Acute and chronic respiratory failure with hypercapnia: Secondary | ICD-10-CM | POA: Diagnosis not present

## 2023-02-04 DIAGNOSIS — R532 Functional quadriplegia: Secondary | ICD-10-CM | POA: Diagnosis not present

## 2023-02-04 NOTE — Telephone Encounter (Signed)
Received 02/01/2023 [facility] order. Gave to [place] for signature-nm

## 2023-02-05 ENCOUNTER — Other Ambulatory Visit: Payer: Self-pay | Admitting: Nurse Practitioner

## 2023-02-05 DIAGNOSIS — Z760 Encounter for issue of repeat prescription: Secondary | ICD-10-CM

## 2023-02-08 ENCOUNTER — Telehealth: Payer: Self-pay | Admitting: Nurse Practitioner

## 2023-02-08 DIAGNOSIS — J4520 Mild intermittent asthma, uncomplicated: Secondary | ICD-10-CM | POA: Diagnosis not present

## 2023-02-08 DIAGNOSIS — I130 Hypertensive heart and chronic kidney disease with heart failure and stage 1 through stage 4 chronic kidney disease, or unspecified chronic kidney disease: Secondary | ICD-10-CM | POA: Diagnosis not present

## 2023-02-08 DIAGNOSIS — G8929 Other chronic pain: Secondary | ICD-10-CM | POA: Diagnosis not present

## 2023-02-08 DIAGNOSIS — E785 Hyperlipidemia, unspecified: Secondary | ICD-10-CM | POA: Diagnosis not present

## 2023-02-08 DIAGNOSIS — M5441 Lumbago with sciatica, right side: Secondary | ICD-10-CM | POA: Diagnosis not present

## 2023-02-08 DIAGNOSIS — J432 Centrilobular emphysema: Secondary | ICD-10-CM | POA: Diagnosis not present

## 2023-02-08 DIAGNOSIS — M1990 Unspecified osteoarthritis, unspecified site: Secondary | ICD-10-CM | POA: Diagnosis not present

## 2023-02-08 DIAGNOSIS — Z7901 Long term (current) use of anticoagulants: Secondary | ICD-10-CM | POA: Diagnosis not present

## 2023-02-08 DIAGNOSIS — I5033 Acute on chronic diastolic (congestive) heart failure: Secondary | ICD-10-CM | POA: Diagnosis not present

## 2023-02-08 DIAGNOSIS — I700 Atherosclerosis of aorta: Secondary | ICD-10-CM | POA: Diagnosis not present

## 2023-02-08 DIAGNOSIS — E875 Hyperkalemia: Secondary | ICD-10-CM | POA: Diagnosis not present

## 2023-02-08 DIAGNOSIS — N179 Acute kidney failure, unspecified: Secondary | ICD-10-CM | POA: Diagnosis not present

## 2023-02-08 DIAGNOSIS — K219 Gastro-esophageal reflux disease without esophagitis: Secondary | ICD-10-CM | POA: Diagnosis not present

## 2023-02-08 DIAGNOSIS — J9621 Acute and chronic respiratory failure with hypoxia: Secondary | ICD-10-CM | POA: Diagnosis not present

## 2023-02-08 DIAGNOSIS — J9622 Acute and chronic respiratory failure with hypercapnia: Secondary | ICD-10-CM | POA: Diagnosis not present

## 2023-02-08 DIAGNOSIS — J440 Chronic obstructive pulmonary disease with acute lower respiratory infection: Secondary | ICD-10-CM | POA: Diagnosis not present

## 2023-02-08 DIAGNOSIS — E1122 Type 2 diabetes mellitus with diabetic chronic kidney disease: Secondary | ICD-10-CM | POA: Diagnosis not present

## 2023-02-08 DIAGNOSIS — N1831 Chronic kidney disease, stage 3a: Secondary | ICD-10-CM | POA: Diagnosis not present

## 2023-02-08 DIAGNOSIS — J441 Chronic obstructive pulmonary disease with (acute) exacerbation: Secondary | ICD-10-CM | POA: Diagnosis not present

## 2023-02-08 DIAGNOSIS — R532 Functional quadriplegia: Secondary | ICD-10-CM | POA: Diagnosis not present

## 2023-02-08 DIAGNOSIS — I2699 Other pulmonary embolism without acute cor pulmonale: Secondary | ICD-10-CM | POA: Diagnosis not present

## 2023-02-08 DIAGNOSIS — I2510 Atherosclerotic heart disease of native coronary artery without angina pectoris: Secondary | ICD-10-CM | POA: Diagnosis not present

## 2023-02-08 NOTE — Telephone Encounter (Signed)
[facility] order signed. Faxed back; [phone]. To be scanned-nm

## 2023-02-11 ENCOUNTER — Telehealth: Payer: Self-pay | Admitting: Nurse Practitioner

## 2023-02-11 NOTE — Telephone Encounter (Signed)
Received [facility] Prescription form. Gave to AA for signature-nm

## 2023-02-12 DIAGNOSIS — J441 Chronic obstructive pulmonary disease with (acute) exacerbation: Secondary | ICD-10-CM | POA: Diagnosis not present

## 2023-02-12 DIAGNOSIS — E875 Hyperkalemia: Secondary | ICD-10-CM | POA: Diagnosis not present

## 2023-02-12 DIAGNOSIS — K219 Gastro-esophageal reflux disease without esophagitis: Secondary | ICD-10-CM | POA: Diagnosis not present

## 2023-02-12 DIAGNOSIS — I130 Hypertensive heart and chronic kidney disease with heart failure and stage 1 through stage 4 chronic kidney disease, or unspecified chronic kidney disease: Secondary | ICD-10-CM | POA: Diagnosis not present

## 2023-02-12 DIAGNOSIS — I2699 Other pulmonary embolism without acute cor pulmonale: Secondary | ICD-10-CM | POA: Diagnosis not present

## 2023-02-12 DIAGNOSIS — I2510 Atherosclerotic heart disease of native coronary artery without angina pectoris: Secondary | ICD-10-CM | POA: Diagnosis not present

## 2023-02-12 DIAGNOSIS — J440 Chronic obstructive pulmonary disease with acute lower respiratory infection: Secondary | ICD-10-CM | POA: Diagnosis not present

## 2023-02-12 DIAGNOSIS — M1990 Unspecified osteoarthritis, unspecified site: Secondary | ICD-10-CM | POA: Diagnosis not present

## 2023-02-12 DIAGNOSIS — J432 Centrilobular emphysema: Secondary | ICD-10-CM | POA: Diagnosis not present

## 2023-02-12 DIAGNOSIS — R532 Functional quadriplegia: Secondary | ICD-10-CM | POA: Diagnosis not present

## 2023-02-12 DIAGNOSIS — J9622 Acute and chronic respiratory failure with hypercapnia: Secondary | ICD-10-CM | POA: Diagnosis not present

## 2023-02-12 DIAGNOSIS — E785 Hyperlipidemia, unspecified: Secondary | ICD-10-CM | POA: Diagnosis not present

## 2023-02-12 DIAGNOSIS — J4520 Mild intermittent asthma, uncomplicated: Secondary | ICD-10-CM | POA: Diagnosis not present

## 2023-02-12 DIAGNOSIS — N179 Acute kidney failure, unspecified: Secondary | ICD-10-CM | POA: Diagnosis not present

## 2023-02-12 DIAGNOSIS — E1122 Type 2 diabetes mellitus with diabetic chronic kidney disease: Secondary | ICD-10-CM | POA: Diagnosis not present

## 2023-02-12 DIAGNOSIS — I5033 Acute on chronic diastolic (congestive) heart failure: Secondary | ICD-10-CM | POA: Diagnosis not present

## 2023-02-12 DIAGNOSIS — M5441 Lumbago with sciatica, right side: Secondary | ICD-10-CM | POA: Diagnosis not present

## 2023-02-12 DIAGNOSIS — G8929 Other chronic pain: Secondary | ICD-10-CM | POA: Diagnosis not present

## 2023-02-12 DIAGNOSIS — I700 Atherosclerosis of aorta: Secondary | ICD-10-CM | POA: Diagnosis not present

## 2023-02-12 DIAGNOSIS — Z7901 Long term (current) use of anticoagulants: Secondary | ICD-10-CM | POA: Diagnosis not present

## 2023-02-12 DIAGNOSIS — J9621 Acute and chronic respiratory failure with hypoxia: Secondary | ICD-10-CM | POA: Diagnosis not present

## 2023-02-12 DIAGNOSIS — N1831 Chronic kidney disease, stage 3a: Secondary | ICD-10-CM | POA: Diagnosis not present

## 2023-02-13 DIAGNOSIS — R532 Functional quadriplegia: Secondary | ICD-10-CM | POA: Diagnosis not present

## 2023-02-13 DIAGNOSIS — I2699 Other pulmonary embolism without acute cor pulmonale: Secondary | ICD-10-CM | POA: Diagnosis not present

## 2023-02-13 DIAGNOSIS — I5033 Acute on chronic diastolic (congestive) heart failure: Secondary | ICD-10-CM | POA: Diagnosis not present

## 2023-02-13 DIAGNOSIS — J432 Centrilobular emphysema: Secondary | ICD-10-CM | POA: Diagnosis not present

## 2023-02-13 DIAGNOSIS — N179 Acute kidney failure, unspecified: Secondary | ICD-10-CM | POA: Diagnosis not present

## 2023-02-13 DIAGNOSIS — I2510 Atherosclerotic heart disease of native coronary artery without angina pectoris: Secondary | ICD-10-CM | POA: Diagnosis not present

## 2023-02-13 DIAGNOSIS — E1122 Type 2 diabetes mellitus with diabetic chronic kidney disease: Secondary | ICD-10-CM | POA: Diagnosis not present

## 2023-02-13 DIAGNOSIS — I700 Atherosclerosis of aorta: Secondary | ICD-10-CM | POA: Diagnosis not present

## 2023-02-13 DIAGNOSIS — G8929 Other chronic pain: Secondary | ICD-10-CM | POA: Diagnosis not present

## 2023-02-13 DIAGNOSIS — J441 Chronic obstructive pulmonary disease with (acute) exacerbation: Secondary | ICD-10-CM | POA: Diagnosis not present

## 2023-02-13 DIAGNOSIS — E785 Hyperlipidemia, unspecified: Secondary | ICD-10-CM | POA: Diagnosis not present

## 2023-02-13 DIAGNOSIS — J4520 Mild intermittent asthma, uncomplicated: Secondary | ICD-10-CM | POA: Diagnosis not present

## 2023-02-13 DIAGNOSIS — K219 Gastro-esophageal reflux disease without esophagitis: Secondary | ICD-10-CM | POA: Diagnosis not present

## 2023-02-13 DIAGNOSIS — J9621 Acute and chronic respiratory failure with hypoxia: Secondary | ICD-10-CM | POA: Diagnosis not present

## 2023-02-13 DIAGNOSIS — M1990 Unspecified osteoarthritis, unspecified site: Secondary | ICD-10-CM | POA: Diagnosis not present

## 2023-02-13 DIAGNOSIS — Z7901 Long term (current) use of anticoagulants: Secondary | ICD-10-CM | POA: Diagnosis not present

## 2023-02-13 DIAGNOSIS — E875 Hyperkalemia: Secondary | ICD-10-CM | POA: Diagnosis not present

## 2023-02-13 DIAGNOSIS — N1831 Chronic kidney disease, stage 3a: Secondary | ICD-10-CM | POA: Diagnosis not present

## 2023-02-13 DIAGNOSIS — I130 Hypertensive heart and chronic kidney disease with heart failure and stage 1 through stage 4 chronic kidney disease, or unspecified chronic kidney disease: Secondary | ICD-10-CM | POA: Diagnosis not present

## 2023-02-13 DIAGNOSIS — J9622 Acute and chronic respiratory failure with hypercapnia: Secondary | ICD-10-CM | POA: Diagnosis not present

## 2023-02-13 DIAGNOSIS — J440 Chronic obstructive pulmonary disease with acute lower respiratory infection: Secondary | ICD-10-CM | POA: Diagnosis not present

## 2023-02-13 DIAGNOSIS — M5441 Lumbago with sciatica, right side: Secondary | ICD-10-CM | POA: Diagnosis not present

## 2023-02-14 DIAGNOSIS — K219 Gastro-esophageal reflux disease without esophagitis: Secondary | ICD-10-CM | POA: Diagnosis not present

## 2023-02-14 DIAGNOSIS — J9622 Acute and chronic respiratory failure with hypercapnia: Secondary | ICD-10-CM | POA: Diagnosis not present

## 2023-02-14 DIAGNOSIS — I2510 Atherosclerotic heart disease of native coronary artery without angina pectoris: Secondary | ICD-10-CM | POA: Diagnosis not present

## 2023-02-14 DIAGNOSIS — I5033 Acute on chronic diastolic (congestive) heart failure: Secondary | ICD-10-CM | POA: Diagnosis not present

## 2023-02-14 DIAGNOSIS — I130 Hypertensive heart and chronic kidney disease with heart failure and stage 1 through stage 4 chronic kidney disease, or unspecified chronic kidney disease: Secondary | ICD-10-CM | POA: Diagnosis not present

## 2023-02-14 DIAGNOSIS — J441 Chronic obstructive pulmonary disease with (acute) exacerbation: Secondary | ICD-10-CM | POA: Diagnosis not present

## 2023-02-14 DIAGNOSIS — J432 Centrilobular emphysema: Secondary | ICD-10-CM | POA: Diagnosis not present

## 2023-02-14 DIAGNOSIS — I2699 Other pulmonary embolism without acute cor pulmonale: Secondary | ICD-10-CM | POA: Diagnosis not present

## 2023-02-14 DIAGNOSIS — J440 Chronic obstructive pulmonary disease with acute lower respiratory infection: Secondary | ICD-10-CM | POA: Diagnosis not present

## 2023-02-14 DIAGNOSIS — E1122 Type 2 diabetes mellitus with diabetic chronic kidney disease: Secondary | ICD-10-CM | POA: Diagnosis not present

## 2023-02-14 DIAGNOSIS — N1831 Chronic kidney disease, stage 3a: Secondary | ICD-10-CM | POA: Diagnosis not present

## 2023-02-14 DIAGNOSIS — Z7901 Long term (current) use of anticoagulants: Secondary | ICD-10-CM | POA: Diagnosis not present

## 2023-02-14 DIAGNOSIS — N179 Acute kidney failure, unspecified: Secondary | ICD-10-CM | POA: Diagnosis not present

## 2023-02-14 DIAGNOSIS — E785 Hyperlipidemia, unspecified: Secondary | ICD-10-CM | POA: Diagnosis not present

## 2023-02-14 DIAGNOSIS — J9621 Acute and chronic respiratory failure with hypoxia: Secondary | ICD-10-CM | POA: Diagnosis not present

## 2023-02-14 DIAGNOSIS — E875 Hyperkalemia: Secondary | ICD-10-CM | POA: Diagnosis not present

## 2023-02-14 DIAGNOSIS — J4520 Mild intermittent asthma, uncomplicated: Secondary | ICD-10-CM | POA: Diagnosis not present

## 2023-02-14 DIAGNOSIS — M5441 Lumbago with sciatica, right side: Secondary | ICD-10-CM | POA: Diagnosis not present

## 2023-02-14 DIAGNOSIS — M1990 Unspecified osteoarthritis, unspecified site: Secondary | ICD-10-CM | POA: Diagnosis not present

## 2023-02-14 DIAGNOSIS — R532 Functional quadriplegia: Secondary | ICD-10-CM | POA: Diagnosis not present

## 2023-02-14 DIAGNOSIS — G8929 Other chronic pain: Secondary | ICD-10-CM | POA: Diagnosis not present

## 2023-02-14 DIAGNOSIS — I700 Atherosclerosis of aorta: Secondary | ICD-10-CM | POA: Diagnosis not present

## 2023-02-19 NOTE — Telephone Encounter (Signed)
Done

## 2023-02-21 DIAGNOSIS — E1122 Type 2 diabetes mellitus with diabetic chronic kidney disease: Secondary | ICD-10-CM | POA: Diagnosis not present

## 2023-02-21 DIAGNOSIS — G8929 Other chronic pain: Secondary | ICD-10-CM | POA: Diagnosis not present

## 2023-02-21 DIAGNOSIS — I130 Hypertensive heart and chronic kidney disease with heart failure and stage 1 through stage 4 chronic kidney disease, or unspecified chronic kidney disease: Secondary | ICD-10-CM | POA: Diagnosis not present

## 2023-02-21 DIAGNOSIS — M1990 Unspecified osteoarthritis, unspecified site: Secondary | ICD-10-CM | POA: Diagnosis not present

## 2023-02-21 DIAGNOSIS — J441 Chronic obstructive pulmonary disease with (acute) exacerbation: Secondary | ICD-10-CM | POA: Diagnosis not present

## 2023-02-21 DIAGNOSIS — Z7901 Long term (current) use of anticoagulants: Secondary | ICD-10-CM | POA: Diagnosis not present

## 2023-02-21 DIAGNOSIS — N179 Acute kidney failure, unspecified: Secondary | ICD-10-CM | POA: Diagnosis not present

## 2023-02-21 DIAGNOSIS — I2510 Atherosclerotic heart disease of native coronary artery without angina pectoris: Secondary | ICD-10-CM | POA: Diagnosis not present

## 2023-02-21 DIAGNOSIS — J9622 Acute and chronic respiratory failure with hypercapnia: Secondary | ICD-10-CM | POA: Diagnosis not present

## 2023-02-21 DIAGNOSIS — I700 Atherosclerosis of aorta: Secondary | ICD-10-CM | POA: Diagnosis not present

## 2023-02-21 DIAGNOSIS — I2699 Other pulmonary embolism without acute cor pulmonale: Secondary | ICD-10-CM | POA: Diagnosis not present

## 2023-02-21 DIAGNOSIS — E875 Hyperkalemia: Secondary | ICD-10-CM | POA: Diagnosis not present

## 2023-02-21 DIAGNOSIS — K219 Gastro-esophageal reflux disease without esophagitis: Secondary | ICD-10-CM | POA: Diagnosis not present

## 2023-02-21 DIAGNOSIS — J9621 Acute and chronic respiratory failure with hypoxia: Secondary | ICD-10-CM | POA: Diagnosis not present

## 2023-02-21 DIAGNOSIS — E785 Hyperlipidemia, unspecified: Secondary | ICD-10-CM | POA: Diagnosis not present

## 2023-02-21 DIAGNOSIS — N1831 Chronic kidney disease, stage 3a: Secondary | ICD-10-CM | POA: Diagnosis not present

## 2023-02-21 DIAGNOSIS — R532 Functional quadriplegia: Secondary | ICD-10-CM | POA: Diagnosis not present

## 2023-02-21 DIAGNOSIS — I5033 Acute on chronic diastolic (congestive) heart failure: Secondary | ICD-10-CM | POA: Diagnosis not present

## 2023-02-21 DIAGNOSIS — J4520 Mild intermittent asthma, uncomplicated: Secondary | ICD-10-CM | POA: Diagnosis not present

## 2023-02-21 DIAGNOSIS — J432 Centrilobular emphysema: Secondary | ICD-10-CM | POA: Diagnosis not present

## 2023-02-21 DIAGNOSIS — J440 Chronic obstructive pulmonary disease with acute lower respiratory infection: Secondary | ICD-10-CM | POA: Diagnosis not present

## 2023-02-21 DIAGNOSIS — M5441 Lumbago with sciatica, right side: Secondary | ICD-10-CM | POA: Diagnosis not present

## 2023-02-22 ENCOUNTER — Telehealth: Payer: Self-pay | Admitting: Nurse Practitioner

## 2023-02-22 DIAGNOSIS — J441 Chronic obstructive pulmonary disease with (acute) exacerbation: Secondary | ICD-10-CM | POA: Diagnosis not present

## 2023-02-22 DIAGNOSIS — I5033 Acute on chronic diastolic (congestive) heart failure: Secondary | ICD-10-CM | POA: Diagnosis not present

## 2023-02-22 DIAGNOSIS — I130 Hypertensive heart and chronic kidney disease with heart failure and stage 1 through stage 4 chronic kidney disease, or unspecified chronic kidney disease: Secondary | ICD-10-CM | POA: Diagnosis not present

## 2023-02-22 DIAGNOSIS — J440 Chronic obstructive pulmonary disease with acute lower respiratory infection: Secondary | ICD-10-CM | POA: Diagnosis not present

## 2023-02-22 DIAGNOSIS — J432 Centrilobular emphysema: Secondary | ICD-10-CM | POA: Diagnosis not present

## 2023-02-22 NOTE — Telephone Encounter (Signed)
[employer] order signed. Faxed back [facility]; [phone]. Scanned-[place]

## 2023-02-26 DIAGNOSIS — J9622 Acute and chronic respiratory failure with hypercapnia: Secondary | ICD-10-CM | POA: Diagnosis not present

## 2023-02-26 DIAGNOSIS — J432 Centrilobular emphysema: Secondary | ICD-10-CM | POA: Diagnosis not present

## 2023-02-26 DIAGNOSIS — I5033 Acute on chronic diastolic (congestive) heart failure: Secondary | ICD-10-CM | POA: Diagnosis not present

## 2023-02-26 DIAGNOSIS — J440 Chronic obstructive pulmonary disease with acute lower respiratory infection: Secondary | ICD-10-CM | POA: Diagnosis not present

## 2023-02-26 DIAGNOSIS — M1990 Unspecified osteoarthritis, unspecified site: Secondary | ICD-10-CM | POA: Diagnosis not present

## 2023-02-26 DIAGNOSIS — I130 Hypertensive heart and chronic kidney disease with heart failure and stage 1 through stage 4 chronic kidney disease, or unspecified chronic kidney disease: Secondary | ICD-10-CM | POA: Diagnosis not present

## 2023-02-26 DIAGNOSIS — J9621 Acute and chronic respiratory failure with hypoxia: Secondary | ICD-10-CM | POA: Diagnosis not present

## 2023-02-26 DIAGNOSIS — N179 Acute kidney failure, unspecified: Secondary | ICD-10-CM | POA: Diagnosis not present

## 2023-02-26 DIAGNOSIS — K219 Gastro-esophageal reflux disease without esophagitis: Secondary | ICD-10-CM | POA: Diagnosis not present

## 2023-02-26 DIAGNOSIS — I2510 Atherosclerotic heart disease of native coronary artery without angina pectoris: Secondary | ICD-10-CM | POA: Diagnosis not present

## 2023-02-26 DIAGNOSIS — E1122 Type 2 diabetes mellitus with diabetic chronic kidney disease: Secondary | ICD-10-CM | POA: Diagnosis not present

## 2023-02-26 DIAGNOSIS — I700 Atherosclerosis of aorta: Secondary | ICD-10-CM | POA: Diagnosis not present

## 2023-02-26 DIAGNOSIS — Z7901 Long term (current) use of anticoagulants: Secondary | ICD-10-CM | POA: Diagnosis not present

## 2023-02-26 DIAGNOSIS — J4520 Mild intermittent asthma, uncomplicated: Secondary | ICD-10-CM | POA: Diagnosis not present

## 2023-02-26 DIAGNOSIS — M5441 Lumbago with sciatica, right side: Secondary | ICD-10-CM | POA: Diagnosis not present

## 2023-02-26 DIAGNOSIS — E875 Hyperkalemia: Secondary | ICD-10-CM | POA: Diagnosis not present

## 2023-02-26 DIAGNOSIS — E785 Hyperlipidemia, unspecified: Secondary | ICD-10-CM | POA: Diagnosis not present

## 2023-02-26 DIAGNOSIS — N1831 Chronic kidney disease, stage 3a: Secondary | ICD-10-CM | POA: Diagnosis not present

## 2023-02-26 DIAGNOSIS — J441 Chronic obstructive pulmonary disease with (acute) exacerbation: Secondary | ICD-10-CM | POA: Diagnosis not present

## 2023-02-26 DIAGNOSIS — G8929 Other chronic pain: Secondary | ICD-10-CM | POA: Diagnosis not present

## 2023-02-26 DIAGNOSIS — R532 Functional quadriplegia: Secondary | ICD-10-CM | POA: Diagnosis not present

## 2023-02-26 DIAGNOSIS — I2699 Other pulmonary embolism without acute cor pulmonale: Secondary | ICD-10-CM | POA: Diagnosis not present

## 2023-02-28 ENCOUNTER — Telehealth: Payer: Self-pay | Admitting: Nurse Practitioner

## 2023-02-28 DIAGNOSIS — M5441 Lumbago with sciatica, right side: Secondary | ICD-10-CM | POA: Diagnosis not present

## 2023-02-28 DIAGNOSIS — Z7901 Long term (current) use of anticoagulants: Secondary | ICD-10-CM | POA: Diagnosis not present

## 2023-02-28 DIAGNOSIS — K219 Gastro-esophageal reflux disease without esophagitis: Secondary | ICD-10-CM | POA: Diagnosis not present

## 2023-02-28 DIAGNOSIS — I5033 Acute on chronic diastolic (congestive) heart failure: Secondary | ICD-10-CM | POA: Diagnosis not present

## 2023-02-28 DIAGNOSIS — J9622 Acute and chronic respiratory failure with hypercapnia: Secondary | ICD-10-CM | POA: Diagnosis not present

## 2023-02-28 DIAGNOSIS — G8929 Other chronic pain: Secondary | ICD-10-CM | POA: Diagnosis not present

## 2023-02-28 DIAGNOSIS — N1831 Chronic kidney disease, stage 3a: Secondary | ICD-10-CM | POA: Diagnosis not present

## 2023-02-28 DIAGNOSIS — R532 Functional quadriplegia: Secondary | ICD-10-CM | POA: Diagnosis not present

## 2023-02-28 DIAGNOSIS — E785 Hyperlipidemia, unspecified: Secondary | ICD-10-CM | POA: Diagnosis not present

## 2023-02-28 DIAGNOSIS — J9621 Acute and chronic respiratory failure with hypoxia: Secondary | ICD-10-CM | POA: Diagnosis not present

## 2023-02-28 DIAGNOSIS — I130 Hypertensive heart and chronic kidney disease with heart failure and stage 1 through stage 4 chronic kidney disease, or unspecified chronic kidney disease: Secondary | ICD-10-CM | POA: Diagnosis not present

## 2023-02-28 DIAGNOSIS — M1990 Unspecified osteoarthritis, unspecified site: Secondary | ICD-10-CM | POA: Diagnosis not present

## 2023-02-28 DIAGNOSIS — E1122 Type 2 diabetes mellitus with diabetic chronic kidney disease: Secondary | ICD-10-CM | POA: Diagnosis not present

## 2023-02-28 DIAGNOSIS — I2699 Other pulmonary embolism without acute cor pulmonale: Secondary | ICD-10-CM | POA: Diagnosis not present

## 2023-02-28 DIAGNOSIS — I2510 Atherosclerotic heart disease of native coronary artery without angina pectoris: Secondary | ICD-10-CM | POA: Diagnosis not present

## 2023-02-28 DIAGNOSIS — J441 Chronic obstructive pulmonary disease with (acute) exacerbation: Secondary | ICD-10-CM | POA: Diagnosis not present

## 2023-02-28 DIAGNOSIS — J432 Centrilobular emphysema: Secondary | ICD-10-CM | POA: Diagnosis not present

## 2023-02-28 DIAGNOSIS — E875 Hyperkalemia: Secondary | ICD-10-CM | POA: Diagnosis not present

## 2023-02-28 DIAGNOSIS — J4520 Mild intermittent asthma, uncomplicated: Secondary | ICD-10-CM | POA: Diagnosis not present

## 2023-02-28 DIAGNOSIS — I700 Atherosclerosis of aorta: Secondary | ICD-10-CM | POA: Diagnosis not present

## 2023-02-28 DIAGNOSIS — J440 Chronic obstructive pulmonary disease with acute lower respiratory infection: Secondary | ICD-10-CM | POA: Diagnosis not present

## 2023-02-28 DIAGNOSIS — N179 Acute kidney failure, unspecified: Secondary | ICD-10-CM | POA: Diagnosis not present

## 2023-02-28 NOTE — Telephone Encounter (Signed)
error 

## 2023-03-02 DIAGNOSIS — J449 Chronic obstructive pulmonary disease, unspecified: Secondary | ICD-10-CM | POA: Diagnosis not present

## 2023-03-04 DIAGNOSIS — I2699 Other pulmonary embolism without acute cor pulmonale: Secondary | ICD-10-CM | POA: Diagnosis not present

## 2023-03-04 DIAGNOSIS — E875 Hyperkalemia: Secondary | ICD-10-CM | POA: Diagnosis not present

## 2023-03-04 DIAGNOSIS — R532 Functional quadriplegia: Secondary | ICD-10-CM | POA: Diagnosis not present

## 2023-03-04 DIAGNOSIS — J440 Chronic obstructive pulmonary disease with acute lower respiratory infection: Secondary | ICD-10-CM | POA: Diagnosis not present

## 2023-03-04 DIAGNOSIS — J4520 Mild intermittent asthma, uncomplicated: Secondary | ICD-10-CM | POA: Diagnosis not present

## 2023-03-04 DIAGNOSIS — I130 Hypertensive heart and chronic kidney disease with heart failure and stage 1 through stage 4 chronic kidney disease, or unspecified chronic kidney disease: Secondary | ICD-10-CM | POA: Diagnosis not present

## 2023-03-04 DIAGNOSIS — G8929 Other chronic pain: Secondary | ICD-10-CM | POA: Diagnosis not present

## 2023-03-04 DIAGNOSIS — I700 Atherosclerosis of aorta: Secondary | ICD-10-CM | POA: Diagnosis not present

## 2023-03-04 DIAGNOSIS — N1831 Chronic kidney disease, stage 3a: Secondary | ICD-10-CM | POA: Diagnosis not present

## 2023-03-04 DIAGNOSIS — J9622 Acute and chronic respiratory failure with hypercapnia: Secondary | ICD-10-CM | POA: Diagnosis not present

## 2023-03-04 DIAGNOSIS — I2510 Atherosclerotic heart disease of native coronary artery without angina pectoris: Secondary | ICD-10-CM | POA: Diagnosis not present

## 2023-03-04 DIAGNOSIS — J432 Centrilobular emphysema: Secondary | ICD-10-CM | POA: Diagnosis not present

## 2023-03-04 DIAGNOSIS — E1122 Type 2 diabetes mellitus with diabetic chronic kidney disease: Secondary | ICD-10-CM | POA: Diagnosis not present

## 2023-03-04 DIAGNOSIS — N179 Acute kidney failure, unspecified: Secondary | ICD-10-CM | POA: Diagnosis not present

## 2023-03-04 DIAGNOSIS — M5441 Lumbago with sciatica, right side: Secondary | ICD-10-CM | POA: Diagnosis not present

## 2023-03-04 DIAGNOSIS — I5033 Acute on chronic diastolic (congestive) heart failure: Secondary | ICD-10-CM | POA: Diagnosis not present

## 2023-03-04 DIAGNOSIS — M1990 Unspecified osteoarthritis, unspecified site: Secondary | ICD-10-CM | POA: Diagnosis not present

## 2023-03-04 DIAGNOSIS — Z7901 Long term (current) use of anticoagulants: Secondary | ICD-10-CM | POA: Diagnosis not present

## 2023-03-04 DIAGNOSIS — E785 Hyperlipidemia, unspecified: Secondary | ICD-10-CM | POA: Diagnosis not present

## 2023-03-04 DIAGNOSIS — K219 Gastro-esophageal reflux disease without esophagitis: Secondary | ICD-10-CM | POA: Diagnosis not present

## 2023-03-04 DIAGNOSIS — J9621 Acute and chronic respiratory failure with hypoxia: Secondary | ICD-10-CM | POA: Diagnosis not present

## 2023-03-04 DIAGNOSIS — J441 Chronic obstructive pulmonary disease with (acute) exacerbation: Secondary | ICD-10-CM | POA: Diagnosis not present

## 2023-03-05 ENCOUNTER — Telehealth: Payer: Self-pay

## 2023-03-07 DIAGNOSIS — M5441 Lumbago with sciatica, right side: Secondary | ICD-10-CM | POA: Diagnosis not present

## 2023-03-07 DIAGNOSIS — I5033 Acute on chronic diastolic (congestive) heart failure: Secondary | ICD-10-CM | POA: Diagnosis not present

## 2023-03-07 DIAGNOSIS — N1831 Chronic kidney disease, stage 3a: Secondary | ICD-10-CM | POA: Diagnosis not present

## 2023-03-07 DIAGNOSIS — K219 Gastro-esophageal reflux disease without esophagitis: Secondary | ICD-10-CM | POA: Diagnosis not present

## 2023-03-07 DIAGNOSIS — I2699 Other pulmonary embolism without acute cor pulmonale: Secondary | ICD-10-CM | POA: Diagnosis not present

## 2023-03-07 DIAGNOSIS — J9621 Acute and chronic respiratory failure with hypoxia: Secondary | ICD-10-CM | POA: Diagnosis not present

## 2023-03-07 DIAGNOSIS — J432 Centrilobular emphysema: Secondary | ICD-10-CM | POA: Diagnosis not present

## 2023-03-07 DIAGNOSIS — I130 Hypertensive heart and chronic kidney disease with heart failure and stage 1 through stage 4 chronic kidney disease, or unspecified chronic kidney disease: Secondary | ICD-10-CM | POA: Diagnosis not present

## 2023-03-07 DIAGNOSIS — J441 Chronic obstructive pulmonary disease with (acute) exacerbation: Secondary | ICD-10-CM | POA: Diagnosis not present

## 2023-03-07 DIAGNOSIS — M1990 Unspecified osteoarthritis, unspecified site: Secondary | ICD-10-CM | POA: Diagnosis not present

## 2023-03-07 DIAGNOSIS — J9622 Acute and chronic respiratory failure with hypercapnia: Secondary | ICD-10-CM | POA: Diagnosis not present

## 2023-03-07 DIAGNOSIS — J4520 Mild intermittent asthma, uncomplicated: Secondary | ICD-10-CM | POA: Diagnosis not present

## 2023-03-07 DIAGNOSIS — R532 Functional quadriplegia: Secondary | ICD-10-CM | POA: Diagnosis not present

## 2023-03-07 DIAGNOSIS — I2510 Atherosclerotic heart disease of native coronary artery without angina pectoris: Secondary | ICD-10-CM | POA: Diagnosis not present

## 2023-03-07 DIAGNOSIS — E1122 Type 2 diabetes mellitus with diabetic chronic kidney disease: Secondary | ICD-10-CM | POA: Diagnosis not present

## 2023-03-07 DIAGNOSIS — G8929 Other chronic pain: Secondary | ICD-10-CM | POA: Diagnosis not present

## 2023-03-07 DIAGNOSIS — N179 Acute kidney failure, unspecified: Secondary | ICD-10-CM | POA: Diagnosis not present

## 2023-03-07 DIAGNOSIS — I700 Atherosclerosis of aorta: Secondary | ICD-10-CM | POA: Diagnosis not present

## 2023-03-07 DIAGNOSIS — E785 Hyperlipidemia, unspecified: Secondary | ICD-10-CM | POA: Diagnosis not present

## 2023-03-07 DIAGNOSIS — J440 Chronic obstructive pulmonary disease with acute lower respiratory infection: Secondary | ICD-10-CM | POA: Diagnosis not present

## 2023-03-07 DIAGNOSIS — E875 Hyperkalemia: Secondary | ICD-10-CM | POA: Diagnosis not present

## 2023-03-07 DIAGNOSIS — Z7901 Long term (current) use of anticoagulants: Secondary | ICD-10-CM | POA: Diagnosis not present

## 2023-03-12 DIAGNOSIS — N179 Acute kidney failure, unspecified: Secondary | ICD-10-CM | POA: Diagnosis not present

## 2023-03-12 DIAGNOSIS — E785 Hyperlipidemia, unspecified: Secondary | ICD-10-CM | POA: Diagnosis not present

## 2023-03-12 DIAGNOSIS — J9621 Acute and chronic respiratory failure with hypoxia: Secondary | ICD-10-CM | POA: Diagnosis not present

## 2023-03-12 DIAGNOSIS — N1831 Chronic kidney disease, stage 3a: Secondary | ICD-10-CM | POA: Diagnosis not present

## 2023-03-12 DIAGNOSIS — G8929 Other chronic pain: Secondary | ICD-10-CM | POA: Diagnosis not present

## 2023-03-12 DIAGNOSIS — J432 Centrilobular emphysema: Secondary | ICD-10-CM | POA: Diagnosis not present

## 2023-03-12 DIAGNOSIS — K219 Gastro-esophageal reflux disease without esophagitis: Secondary | ICD-10-CM | POA: Diagnosis not present

## 2023-03-12 DIAGNOSIS — E1122 Type 2 diabetes mellitus with diabetic chronic kidney disease: Secondary | ICD-10-CM | POA: Diagnosis not present

## 2023-03-12 DIAGNOSIS — J441 Chronic obstructive pulmonary disease with (acute) exacerbation: Secondary | ICD-10-CM | POA: Diagnosis not present

## 2023-03-12 DIAGNOSIS — M5441 Lumbago with sciatica, right side: Secondary | ICD-10-CM | POA: Diagnosis not present

## 2023-03-12 DIAGNOSIS — I2510 Atherosclerotic heart disease of native coronary artery without angina pectoris: Secondary | ICD-10-CM | POA: Diagnosis not present

## 2023-03-12 DIAGNOSIS — J9622 Acute and chronic respiratory failure with hypercapnia: Secondary | ICD-10-CM | POA: Diagnosis not present

## 2023-03-12 DIAGNOSIS — R532 Functional quadriplegia: Secondary | ICD-10-CM | POA: Diagnosis not present

## 2023-03-12 DIAGNOSIS — Z7901 Long term (current) use of anticoagulants: Secondary | ICD-10-CM | POA: Diagnosis not present

## 2023-03-12 DIAGNOSIS — I700 Atherosclerosis of aorta: Secondary | ICD-10-CM | POA: Diagnosis not present

## 2023-03-12 DIAGNOSIS — I5033 Acute on chronic diastolic (congestive) heart failure: Secondary | ICD-10-CM | POA: Diagnosis not present

## 2023-03-12 DIAGNOSIS — I2699 Other pulmonary embolism without acute cor pulmonale: Secondary | ICD-10-CM | POA: Diagnosis not present

## 2023-03-12 DIAGNOSIS — E875 Hyperkalemia: Secondary | ICD-10-CM | POA: Diagnosis not present

## 2023-03-12 DIAGNOSIS — J440 Chronic obstructive pulmonary disease with acute lower respiratory infection: Secondary | ICD-10-CM | POA: Diagnosis not present

## 2023-03-12 DIAGNOSIS — I130 Hypertensive heart and chronic kidney disease with heart failure and stage 1 through stage 4 chronic kidney disease, or unspecified chronic kidney disease: Secondary | ICD-10-CM | POA: Diagnosis not present

## 2023-03-12 DIAGNOSIS — M1990 Unspecified osteoarthritis, unspecified site: Secondary | ICD-10-CM | POA: Diagnosis not present

## 2023-03-12 DIAGNOSIS — J4520 Mild intermittent asthma, uncomplicated: Secondary | ICD-10-CM | POA: Diagnosis not present

## 2023-03-13 ENCOUNTER — Other Ambulatory Visit: Payer: Self-pay | Admitting: Nurse Practitioner

## 2023-03-13 DIAGNOSIS — Z760 Encounter for issue of repeat prescription: Secondary | ICD-10-CM

## 2023-03-14 DIAGNOSIS — K219 Gastro-esophageal reflux disease without esophagitis: Secondary | ICD-10-CM | POA: Diagnosis not present

## 2023-03-14 DIAGNOSIS — Z7901 Long term (current) use of anticoagulants: Secondary | ICD-10-CM | POA: Diagnosis not present

## 2023-03-14 DIAGNOSIS — J432 Centrilobular emphysema: Secondary | ICD-10-CM | POA: Diagnosis not present

## 2023-03-14 DIAGNOSIS — G8929 Other chronic pain: Secondary | ICD-10-CM | POA: Diagnosis not present

## 2023-03-14 DIAGNOSIS — I2699 Other pulmonary embolism without acute cor pulmonale: Secondary | ICD-10-CM | POA: Diagnosis not present

## 2023-03-14 DIAGNOSIS — J440 Chronic obstructive pulmonary disease with acute lower respiratory infection: Secondary | ICD-10-CM | POA: Diagnosis not present

## 2023-03-14 DIAGNOSIS — I130 Hypertensive heart and chronic kidney disease with heart failure and stage 1 through stage 4 chronic kidney disease, or unspecified chronic kidney disease: Secondary | ICD-10-CM | POA: Diagnosis not present

## 2023-03-14 DIAGNOSIS — E785 Hyperlipidemia, unspecified: Secondary | ICD-10-CM | POA: Diagnosis not present

## 2023-03-14 DIAGNOSIS — R532 Functional quadriplegia: Secondary | ICD-10-CM | POA: Diagnosis not present

## 2023-03-14 DIAGNOSIS — J441 Chronic obstructive pulmonary disease with (acute) exacerbation: Secondary | ICD-10-CM | POA: Diagnosis not present

## 2023-03-14 DIAGNOSIS — E875 Hyperkalemia: Secondary | ICD-10-CM | POA: Diagnosis not present

## 2023-03-14 DIAGNOSIS — M1990 Unspecified osteoarthritis, unspecified site: Secondary | ICD-10-CM | POA: Diagnosis not present

## 2023-03-14 DIAGNOSIS — I5033 Acute on chronic diastolic (congestive) heart failure: Secondary | ICD-10-CM | POA: Diagnosis not present

## 2023-03-14 DIAGNOSIS — N179 Acute kidney failure, unspecified: Secondary | ICD-10-CM | POA: Diagnosis not present

## 2023-03-14 DIAGNOSIS — I2510 Atherosclerotic heart disease of native coronary artery without angina pectoris: Secondary | ICD-10-CM | POA: Diagnosis not present

## 2023-03-14 DIAGNOSIS — N1831 Chronic kidney disease, stage 3a: Secondary | ICD-10-CM | POA: Diagnosis not present

## 2023-03-14 DIAGNOSIS — J4520 Mild intermittent asthma, uncomplicated: Secondary | ICD-10-CM | POA: Diagnosis not present

## 2023-03-14 DIAGNOSIS — I700 Atherosclerosis of aorta: Secondary | ICD-10-CM | POA: Diagnosis not present

## 2023-03-14 DIAGNOSIS — M5441 Lumbago with sciatica, right side: Secondary | ICD-10-CM | POA: Diagnosis not present

## 2023-03-14 DIAGNOSIS — J9622 Acute and chronic respiratory failure with hypercapnia: Secondary | ICD-10-CM | POA: Diagnosis not present

## 2023-03-14 DIAGNOSIS — J9621 Acute and chronic respiratory failure with hypoxia: Secondary | ICD-10-CM | POA: Diagnosis not present

## 2023-03-14 DIAGNOSIS — E1122 Type 2 diabetes mellitus with diabetic chronic kidney disease: Secondary | ICD-10-CM | POA: Diagnosis not present

## 2023-03-18 ENCOUNTER — Other Ambulatory Visit: Payer: Self-pay | Admitting: Nurse Practitioner

## 2023-03-18 ENCOUNTER — Telehealth: Payer: Self-pay

## 2023-03-18 DIAGNOSIS — I5033 Acute on chronic diastolic (congestive) heart failure: Secondary | ICD-10-CM | POA: Diagnosis not present

## 2023-03-18 DIAGNOSIS — J440 Chronic obstructive pulmonary disease with acute lower respiratory infection: Secondary | ICD-10-CM | POA: Diagnosis not present

## 2023-03-18 DIAGNOSIS — K219 Gastro-esophageal reflux disease without esophagitis: Secondary | ICD-10-CM | POA: Diagnosis not present

## 2023-03-18 DIAGNOSIS — J9622 Acute and chronic respiratory failure with hypercapnia: Secondary | ICD-10-CM | POA: Diagnosis not present

## 2023-03-18 DIAGNOSIS — J9621 Acute and chronic respiratory failure with hypoxia: Secondary | ICD-10-CM | POA: Diagnosis not present

## 2023-03-18 DIAGNOSIS — R532 Functional quadriplegia: Secondary | ICD-10-CM | POA: Diagnosis not present

## 2023-03-18 DIAGNOSIS — E1122 Type 2 diabetes mellitus with diabetic chronic kidney disease: Secondary | ICD-10-CM | POA: Diagnosis not present

## 2023-03-18 DIAGNOSIS — M1990 Unspecified osteoarthritis, unspecified site: Secondary | ICD-10-CM | POA: Diagnosis not present

## 2023-03-18 DIAGNOSIS — J432 Centrilobular emphysema: Secondary | ICD-10-CM | POA: Diagnosis not present

## 2023-03-18 DIAGNOSIS — L299 Pruritus, unspecified: Secondary | ICD-10-CM

## 2023-03-18 DIAGNOSIS — N1831 Chronic kidney disease, stage 3a: Secondary | ICD-10-CM | POA: Diagnosis not present

## 2023-03-18 DIAGNOSIS — I700 Atherosclerosis of aorta: Secondary | ICD-10-CM | POA: Diagnosis not present

## 2023-03-18 DIAGNOSIS — Z7901 Long term (current) use of anticoagulants: Secondary | ICD-10-CM | POA: Diagnosis not present

## 2023-03-18 DIAGNOSIS — I2510 Atherosclerotic heart disease of native coronary artery without angina pectoris: Secondary | ICD-10-CM | POA: Diagnosis not present

## 2023-03-18 DIAGNOSIS — I2699 Other pulmonary embolism without acute cor pulmonale: Secondary | ICD-10-CM | POA: Diagnosis not present

## 2023-03-18 DIAGNOSIS — I130 Hypertensive heart and chronic kidney disease with heart failure and stage 1 through stage 4 chronic kidney disease, or unspecified chronic kidney disease: Secondary | ICD-10-CM | POA: Diagnosis not present

## 2023-03-18 DIAGNOSIS — E875 Hyperkalemia: Secondary | ICD-10-CM | POA: Diagnosis not present

## 2023-03-18 DIAGNOSIS — N179 Acute kidney failure, unspecified: Secondary | ICD-10-CM | POA: Diagnosis not present

## 2023-03-18 DIAGNOSIS — J4520 Mild intermittent asthma, uncomplicated: Secondary | ICD-10-CM | POA: Diagnosis not present

## 2023-03-18 DIAGNOSIS — M5441 Lumbago with sciatica, right side: Secondary | ICD-10-CM | POA: Diagnosis not present

## 2023-03-18 DIAGNOSIS — G8929 Other chronic pain: Secondary | ICD-10-CM | POA: Diagnosis not present

## 2023-03-18 DIAGNOSIS — E785 Hyperlipidemia, unspecified: Secondary | ICD-10-CM | POA: Diagnosis not present

## 2023-03-18 DIAGNOSIS — J441 Chronic obstructive pulmonary disease with (acute) exacerbation: Secondary | ICD-10-CM | POA: Diagnosis not present

## 2023-03-18 NOTE — Telephone Encounter (Signed)
[facility] home health [phone] for physical therapy once a week for 8 weeks

## 2023-03-21 ENCOUNTER — Telehealth: Payer: Self-pay | Admitting: Nurse Practitioner

## 2023-03-21 NOTE — Telephone Encounter (Signed)
Received [facility] home health order form.  Gave to AA for signature-nm

## 2023-03-26 ENCOUNTER — Telehealth (INDEPENDENT_AMBULATORY_CARE_PROVIDER_SITE_OTHER): Payer: Medicare Other | Admitting: Nurse Practitioner

## 2023-03-26 ENCOUNTER — Encounter: Payer: Self-pay | Admitting: Nurse Practitioner

## 2023-03-26 VITALS — Resp 16 | Ht 73.0 in | Wt 391.0 lb

## 2023-03-26 DIAGNOSIS — I2602 Saddle embolus of pulmonary artery with acute cor pulmonale: Secondary | ICD-10-CM

## 2023-03-26 DIAGNOSIS — G8929 Other chronic pain: Secondary | ICD-10-CM

## 2023-03-26 DIAGNOSIS — J9611 Chronic respiratory failure with hypoxia: Secondary | ICD-10-CM

## 2023-03-26 DIAGNOSIS — M5441 Lumbago with sciatica, right side: Secondary | ICD-10-CM

## 2023-03-26 MED ORDER — APIXABAN 5 MG PO TABS: 5.0000 mg | ORAL_TABLET | Freq: Two times a day (BID) | ORAL | 5 refills | Status: DC

## 2023-03-26 NOTE — Progress Notes (Signed)
[facility] [address] [place], [place] [address]  Internal MEDICINE  Telephone Visit  Patient Name: Thomas Tanner  [id number]  [id number]  Date of Service: 03/26/2023  I connected with the patient at 1630 by telephone and verified the patients identity using two identifiers.   I discussed the limitations, risks, security and privacy concerns of performing an evaluation and management service by telephone and the availability of in person appointments. I also discussed with the patient that there may be a patient responsible charge related to the service.  The patient expressed understanding and agrees to proceed.    Chief Complaint  Patient presents with   Telephone Screen    3 month f/u    Telephone Assessment    HPI Thomas Tanner presents for a telehealth virtual visit for 3 month follow up visit.  Was hospitalized earlier this year  Physical therapy is continuing  Thomas Tanner financial assistance denied, trying another program.   Otherwise doing well, physical therapy is helping per patient.     Current Medication: Outpatient Encounter Medications as of 03/26/2023  Medication Sig Note   albuterol (VENTOLIN HFA) 108 (90 Base) MCG/ACT inhaler Inhale 2 puffs into the lungs every 6 (six) hours as needed for wheezing or shortness of breath.    ALPRAZolam (XANAX) 0.25 MG tablet Take 1 tablet (0.25 mg total) by mouth daily as needed (severe anxiety). May take 1 additional dose after 2 hours if the first dose is not effective.    apixaban (Thomas Tanner) 5 MG TABS tablet Take 1 tablet (5 mg total) by mouth 2 (two) times daily.    cetirizine (ZYRTEC) 10 MG tablet Take 1 tablet (10 mg total) by mouth daily.    famotidine (PEPCID) 20 MG tablet Take 1 tablet by mouth twice daily    furosemide (LASIX) 40 MG tablet Take 1 tablet (40 mg total) by mouth daily.    hydrOXYzine (ATARAX) 25 MG tablet TAKE 1 TABLET BY MOUTH THREE TIMES DAILY AS NEEDED FOR ITCHING     ipratropium-albuterol (DUONEB) 0.5-2.5 (3) MG/3ML SOLN Take 3 mLs by nebulization every 6 (six) hours as needed.    iron polysaccharides (NIFEREX) 150 MG capsule Take 1 capsule (150 mg total) by mouth daily.    lansoprazole (PREVACID) 30 MG capsule TAKE 1 CAPSULE BY MOUTH ONCE DAILY BEFORE BREAKFAST    metoprolol succinate (TOPROL-XL) 50 MG 24 hr tablet Take 1 tablet (50 mg total) by mouth daily. Take with or immediately following a meal. 12/16/2022: Last filled 08/17/22 90 day supply   OXYGEN Inhale 2.5 L into the lungs. 24 HRS CONTINUOUS OXYGEN USES [facility]    phenylephrine-shark liver oil-mineral oil-petrolatum (PREPARATION H) 0.25-14-74.9 % rectal ointment Place 1 application rectally 2 (two) times daily as needed for hemorrhoids.    rosuvastatin (CRESTOR) 20 MG tablet Take 1 tablet (20 mg total) by mouth daily. 12/16/2022: Last filled 10/15/22 90 day supply   senna-docusate (SENOKOT-S) 8.6-50 MG tablet Take 1 tablet by mouth 2 (two) times daily.    traMADol (ULTRAM) 50 MG tablet Take 1 tablet (50 mg total) by mouth every 6 (six) hours as needed.    vitamin B-12 1000 MCG tablet Take 1 tablet (1,000 mcg total) by mouth daily.    [DISCONTINUED] rivaroxaban (XARELTO) 20 MG TABS tablet Take 1 tablet (20 mg total) by mouth daily with supper.    [DISCONTINUED] RIVAROXABAN (XARELTO) VTE STARTER PACK (15 & 20 MG) Follow package directions: Take one 15mg  tablet by mouth twice a  day. On day 22, switch to one 20mg  tablet once a day. Take with food.    No facility-administered encounter medications on file as of 03/26/2023.    Surgical History: Past Surgical History:  Procedure Laterality Date   CORONARY/GRAFT ACUTE MI REVASCULARIZATION N/A 12/08/2018   Procedure: Coronary/Graft Acute MI Revascularization;  Surgeon: Alwyn Pea, MD;  Location: [facility];  Service: Cardiovascular;  Laterality: N/A;   INCISION AND DRAINAGE ABSCESS N/A 05/18/2021   Procedure: INCISION AND DRAINAGE  SCROTAL ABSCESS, cystoscopy with urethral dilation, and complex catheter placement;  Surgeon: Sondra Come, MD;  Location: [facility];  Service: Urology;  Laterality: N/A;   IRRIGATION AND DEBRIDEMENT ABSCESS N/A 05/20/2021   Procedure: IRRIGATION AND DEBRIDEMENT ABSCESS;  Surgeon: Riki Altes, MD;  Location: [facility];  Service: Urology;  Laterality: N/A;   IRRIGATION AND DEBRIDEMENT ABSCESS N/A 05/21/2021   Procedure: IRRIGATION AND DEBRIDEMENT ABSCESS;  Surgeon: Riki Altes, MD;  Location: [facility];  Service: Urology;  Laterality: N/A;   KIDNEY STONE SURGERY     left arm surgery  1963   LEFT HEART CATH AND CORONARY ANGIOGRAPHY N/A 12/08/2018   Procedure: LEFT HEART CATH AND CORONARY ANGIOGRAPHY;  Surgeon: Alwyn Pea, MD;  Location: [facility];  Service: Cardiovascular;  Laterality: N/A;   PULMONARY THROMBECTOMY N/A 08/03/2020   Procedure: PULMONARY THROMBECTOMY / THROMBOLYSIS;  Surgeon: Annice Needy, MD;  Location: [facility];  Service: Cardiovascular;  Laterality: N/A;   PULMONARY THROMBECTOMY Bilateral 01/08/2023   Procedure: PULMONARY THROMBECTOMY;  Surgeon: Renford Dills, MD;  Location: [facility];  Service: Cardiovascular;  Laterality: Bilateral;   RECTAL EXAM UNDER ANESTHESIA N/A 05/20/2021   Procedure: EXAM UNDER ANESTHESIA-Dressing and Change;  Surgeon: Riki Altes, MD;  Location: [facility];  Service: Urology;  Laterality: N/A;    Medical History: Past Medical History:  Diagnosis Date   Arthritis    CHF (congestive heart failure) (HCC)    COPD (chronic obstructive pulmonary disease) (HCC)    Diabetes (HCC)    Hyperlipidemia    Hypertension    Kidney stone    Obesity    Tachycardia    TIA (transient ischemic attack)     Family History: Family History  Problem Relation Age of Onset   Alzheimer's disease Mother    CAD Father     Social History   Socioeconomic History   Marital status: Married    Spouse name:  [name]    Number of children: 3   Years of education: Not on file   Highest education level: Not on file  Occupational History   Not on file  Tobacco Use   Smoking status: Every Day    Packs/day: .5    Types: Cigarettes    Last attempt to quit: 07/13/2020    Years since quitting: 2.7    Passive exposure: Current   Smokeless tobacco: Never  Substance and Sexual Activity   Alcohol use: No   Drug use: No   Sexual activity: Not Currently  Other Topics Concern   Not on file  Social History Narrative   Lives at home with wife , youngest son, grand daughter and great greatson    Social Determinants of Health   Financial Resource Strain: Not on file  Food Insecurity: No Food Insecurity (12/17/2022)   Hunger Vital Sign    Worried About Running Out of Food in the Last Year: Never true    Ran Out  of Food in the Last Year: Never true  Transportation Needs: No Transportation Needs (12/17/2022)   PRAPARE - Administrator, Civil Service (Medical): No    Lack of Transportation (Non-Medical): No  Physical Activity: Unknown (12/08/2018)   Exercise Vital Sign    Days of Exercise per Week: 0 days    Minutes of Exercise per Session: Not on file  Stress: No Stress Concern Present (12/08/2018)   [employer] of Occupational Health - Occupational Stress Questionnaire    Feeling of Stress : Only a little  Social Connections: Moderately Integrated (12/08/2018)   Social Connection and Isolation Panel [NHANES]    Frequency of Communication with Friends and Family: More than three times a week    Frequency of Social Gatherings with Friends and Family: Three times a week    Attends Religious Services: 1 to 4 times per year    Active Member of Clubs or Organizations: No    Attends Banker Meetings: Never    Marital Status: Married  Catering manager Violence: Not At Risk (12/17/2022)   Humiliation, Afraid, Rape, and Kick questionnaire    Fear of Current or Ex-Partner: No     Emotionally Abused: No    Physically Abused: No    Sexually Abused: No      Review of Systems  Constitutional:  Positive for fatigue. Negative for chills and unexpected weight change.  HENT:  Positive for postnasal drip. Negative for congestion, rhinorrhea, sneezing and sore throat.   Eyes:  Negative for redness.  Respiratory:  Negative for cough, chest tightness and shortness of breath.   Cardiovascular:  Negative for chest pain and palpitations.  Gastrointestinal:  Negative for abdominal pain, constipation, diarrhea, nausea and vomiting.  Genitourinary:  Negative for dysuria and frequency.  Musculoskeletal:  Positive for gait problem. Negative for arthralgias, back pain, joint swelling and neck pain.  Skin:  Negative for rash.  Neurological:  Positive for weakness. Negative for tremors and numbness.  Hematological:  Negative for adenopathy. Does not bruise/bleed easily.  Psychiatric/Behavioral:  Negative for behavioral problems (Depression), sleep disturbance and suicidal ideas. The patient is not nervous/anxious.     Vital Signs: Resp 16   Ht 6\' 1"  (1.854 m)   Wt (!) 391 lb (177.4 kg)   BMI 51.59 kg/m    Observation/Objective: He is alert and oriented and engages in conversation appropriately. No acute distress noted.    Assessment/Plan: 1. Chronic midline low back pain with right-sided sciatica Working with physical therapy  2. Chronic respiratory failure with hypoxia (HCC) On continuous supplemental oxygen  3. Acute saddle pulmonary embolism with acute cor pulmonale (HCC) On Thomas Tanner, working on getting financial assistance with the medication   General Counseling: Riyansh verbalizes understanding of the findings of today's phone visit and agrees with plan of treatment. I have discussed any further diagnostic evaluation that may be needed or ordered today. We also reviewed his medications today. he has been encouraged to call the office with any questions or concerns  that should arise related to todays visit.  Return in about 3 months (around 06/25/2023) for F/U, Koty Anctil PCP, upcoming annual wellness visit.   No orders of the defined types were placed in this encounter.   Meds ordered this encounter  Medications   apixaban (Thomas Tanner) 5 MG TABS tablet    Sig: Take 1 tablet (5 mg total) by mouth 2 (two) times daily.    Dispense:  60 tablet    Refill:  5  Time spent:20 Minutes Time spent with patient included reviewing progress notes, labs, imaging studies, and discussing plan for follow up.  [place] Controlled Substance Database was reviewed by me for overdose risk score (ORS) if appropriate.  This patient was seen by Jonetta Osgood, FNP-C in collaboration with Dr. Clayborn Bigness as a part of collaborative care agreement.  Tammi Boulier R. Valetta Fuller, MSN, FNP-C Internal medicine

## 2023-03-28 DIAGNOSIS — J9622 Acute and chronic respiratory failure with hypercapnia: Secondary | ICD-10-CM | POA: Diagnosis not present

## 2023-03-28 DIAGNOSIS — R532 Functional quadriplegia: Secondary | ICD-10-CM | POA: Diagnosis not present

## 2023-03-28 DIAGNOSIS — I700 Atherosclerosis of aorta: Secondary | ICD-10-CM | POA: Diagnosis not present

## 2023-03-28 DIAGNOSIS — Z9981 Dependence on supplemental oxygen: Secondary | ICD-10-CM | POA: Diagnosis not present

## 2023-03-28 DIAGNOSIS — J9621 Acute and chronic respiratory failure with hypoxia: Secondary | ICD-10-CM | POA: Diagnosis not present

## 2023-03-28 DIAGNOSIS — J449 Chronic obstructive pulmonary disease, unspecified: Secondary | ICD-10-CM | POA: Diagnosis not present

## 2023-03-28 DIAGNOSIS — I130 Hypertensive heart and chronic kidney disease with heart failure and stage 1 through stage 4 chronic kidney disease, or unspecified chronic kidney disease: Secondary | ICD-10-CM | POA: Diagnosis not present

## 2023-03-28 DIAGNOSIS — Z87891 Personal history of nicotine dependence: Secondary | ICD-10-CM | POA: Diagnosis not present

## 2023-03-28 DIAGNOSIS — E1122 Type 2 diabetes mellitus with diabetic chronic kidney disease: Secondary | ICD-10-CM | POA: Diagnosis not present

## 2023-03-28 DIAGNOSIS — M5441 Lumbago with sciatica, right side: Secondary | ICD-10-CM | POA: Diagnosis not present

## 2023-03-28 DIAGNOSIS — E875 Hyperkalemia: Secondary | ICD-10-CM | POA: Diagnosis not present

## 2023-03-28 DIAGNOSIS — M1990 Unspecified osteoarthritis, unspecified site: Secondary | ICD-10-CM | POA: Diagnosis not present

## 2023-03-28 DIAGNOSIS — I5033 Acute on chronic diastolic (congestive) heart failure: Secondary | ICD-10-CM | POA: Diagnosis not present

## 2023-03-28 DIAGNOSIS — J432 Centrilobular emphysema: Secondary | ICD-10-CM | POA: Diagnosis not present

## 2023-03-28 DIAGNOSIS — G8929 Other chronic pain: Secondary | ICD-10-CM | POA: Diagnosis not present

## 2023-03-28 DIAGNOSIS — N1831 Chronic kidney disease, stage 3a: Secondary | ICD-10-CM | POA: Diagnosis not present

## 2023-03-28 DIAGNOSIS — K219 Gastro-esophageal reflux disease without esophagitis: Secondary | ICD-10-CM | POA: Diagnosis not present

## 2023-03-28 DIAGNOSIS — Z87440 Personal history of urinary (tract) infections: Secondary | ICD-10-CM | POA: Diagnosis not present

## 2023-03-28 DIAGNOSIS — J4520 Mild intermittent asthma, uncomplicated: Secondary | ICD-10-CM | POA: Diagnosis not present

## 2023-03-28 DIAGNOSIS — I2510 Atherosclerotic heart disease of native coronary artery without angina pectoris: Secondary | ICD-10-CM | POA: Diagnosis not present

## 2023-03-28 DIAGNOSIS — E785 Hyperlipidemia, unspecified: Secondary | ICD-10-CM | POA: Diagnosis not present

## 2023-03-28 DIAGNOSIS — Z7901 Long term (current) use of anticoagulants: Secondary | ICD-10-CM | POA: Diagnosis not present

## 2023-03-28 DIAGNOSIS — I2699 Other pulmonary embolism without acute cor pulmonale: Secondary | ICD-10-CM | POA: Diagnosis not present

## 2023-04-01 ENCOUNTER — Telehealth: Payer: Self-pay | Admitting: Nurse Practitioner

## 2023-04-01 DIAGNOSIS — G8929 Other chronic pain: Secondary | ICD-10-CM | POA: Diagnosis not present

## 2023-04-01 DIAGNOSIS — M5441 Lumbago with sciatica, right side: Secondary | ICD-10-CM | POA: Diagnosis not present

## 2023-04-01 DIAGNOSIS — I700 Atherosclerosis of aorta: Secondary | ICD-10-CM | POA: Diagnosis not present

## 2023-04-01 DIAGNOSIS — J432 Centrilobular emphysema: Secondary | ICD-10-CM | POA: Diagnosis not present

## 2023-04-01 DIAGNOSIS — Z7901 Long term (current) use of anticoagulants: Secondary | ICD-10-CM | POA: Diagnosis not present

## 2023-04-01 DIAGNOSIS — M1990 Unspecified osteoarthritis, unspecified site: Secondary | ICD-10-CM | POA: Diagnosis not present

## 2023-04-01 DIAGNOSIS — E875 Hyperkalemia: Secondary | ICD-10-CM | POA: Diagnosis not present

## 2023-04-01 DIAGNOSIS — J9622 Acute and chronic respiratory failure with hypercapnia: Secondary | ICD-10-CM | POA: Diagnosis not present

## 2023-04-01 DIAGNOSIS — Z9981 Dependence on supplemental oxygen: Secondary | ICD-10-CM | POA: Diagnosis not present

## 2023-04-01 DIAGNOSIS — I2699 Other pulmonary embolism without acute cor pulmonale: Secondary | ICD-10-CM | POA: Diagnosis not present

## 2023-04-01 DIAGNOSIS — I130 Hypertensive heart and chronic kidney disease with heart failure and stage 1 through stage 4 chronic kidney disease, or unspecified chronic kidney disease: Secondary | ICD-10-CM | POA: Diagnosis not present

## 2023-04-01 DIAGNOSIS — K219 Gastro-esophageal reflux disease without esophagitis: Secondary | ICD-10-CM | POA: Diagnosis not present

## 2023-04-01 DIAGNOSIS — I2510 Atherosclerotic heart disease of native coronary artery without angina pectoris: Secondary | ICD-10-CM | POA: Diagnosis not present

## 2023-04-01 DIAGNOSIS — J449 Chronic obstructive pulmonary disease, unspecified: Secondary | ICD-10-CM | POA: Diagnosis not present

## 2023-04-01 DIAGNOSIS — J9621 Acute and chronic respiratory failure with hypoxia: Secondary | ICD-10-CM | POA: Diagnosis not present

## 2023-04-01 DIAGNOSIS — Z87440 Personal history of urinary (tract) infections: Secondary | ICD-10-CM | POA: Diagnosis not present

## 2023-04-01 DIAGNOSIS — J4520 Mild intermittent asthma, uncomplicated: Secondary | ICD-10-CM | POA: Diagnosis not present

## 2023-04-01 DIAGNOSIS — Z87891 Personal history of nicotine dependence: Secondary | ICD-10-CM | POA: Diagnosis not present

## 2023-04-01 DIAGNOSIS — E1122 Type 2 diabetes mellitus with diabetic chronic kidney disease: Secondary | ICD-10-CM | POA: Diagnosis not present

## 2023-04-01 DIAGNOSIS — E785 Hyperlipidemia, unspecified: Secondary | ICD-10-CM | POA: Diagnosis not present

## 2023-04-01 DIAGNOSIS — I5033 Acute on chronic diastolic (congestive) heart failure: Secondary | ICD-10-CM | POA: Diagnosis not present

## 2023-04-01 DIAGNOSIS — R532 Functional quadriplegia: Secondary | ICD-10-CM | POA: Diagnosis not present

## 2023-04-01 DIAGNOSIS — N1831 Chronic kidney disease, stage 3a: Secondary | ICD-10-CM | POA: Diagnosis not present

## 2023-04-01 NOTE — Telephone Encounter (Signed)
[employer] order signed. Faxed back; [phone]. To be scanned-nm

## 2023-04-02 DIAGNOSIS — J449 Chronic obstructive pulmonary disease, unspecified: Secondary | ICD-10-CM | POA: Diagnosis not present

## 2023-04-04 DIAGNOSIS — I5033 Acute on chronic diastolic (congestive) heart failure: Secondary | ICD-10-CM | POA: Diagnosis not present

## 2023-04-04 DIAGNOSIS — E785 Hyperlipidemia, unspecified: Secondary | ICD-10-CM | POA: Diagnosis not present

## 2023-04-04 DIAGNOSIS — I130 Hypertensive heart and chronic kidney disease with heart failure and stage 1 through stage 4 chronic kidney disease, or unspecified chronic kidney disease: Secondary | ICD-10-CM | POA: Diagnosis not present

## 2023-04-04 DIAGNOSIS — M1990 Unspecified osteoarthritis, unspecified site: Secondary | ICD-10-CM | POA: Diagnosis not present

## 2023-04-04 DIAGNOSIS — K219 Gastro-esophageal reflux disease without esophagitis: Secondary | ICD-10-CM | POA: Diagnosis not present

## 2023-04-04 DIAGNOSIS — N1831 Chronic kidney disease, stage 3a: Secondary | ICD-10-CM | POA: Diagnosis not present

## 2023-04-04 DIAGNOSIS — E875 Hyperkalemia: Secondary | ICD-10-CM | POA: Diagnosis not present

## 2023-04-04 DIAGNOSIS — I2699 Other pulmonary embolism without acute cor pulmonale: Secondary | ICD-10-CM | POA: Diagnosis not present

## 2023-04-04 DIAGNOSIS — G8929 Other chronic pain: Secondary | ICD-10-CM | POA: Diagnosis not present

## 2023-04-04 DIAGNOSIS — R532 Functional quadriplegia: Secondary | ICD-10-CM | POA: Diagnosis not present

## 2023-04-04 DIAGNOSIS — J4520 Mild intermittent asthma, uncomplicated: Secondary | ICD-10-CM | POA: Diagnosis not present

## 2023-04-04 DIAGNOSIS — I700 Atherosclerosis of aorta: Secondary | ICD-10-CM | POA: Diagnosis not present

## 2023-04-04 DIAGNOSIS — Z87891 Personal history of nicotine dependence: Secondary | ICD-10-CM | POA: Diagnosis not present

## 2023-04-04 DIAGNOSIS — Z9981 Dependence on supplemental oxygen: Secondary | ICD-10-CM | POA: Diagnosis not present

## 2023-04-04 DIAGNOSIS — E1122 Type 2 diabetes mellitus with diabetic chronic kidney disease: Secondary | ICD-10-CM | POA: Diagnosis not present

## 2023-04-04 DIAGNOSIS — J432 Centrilobular emphysema: Secondary | ICD-10-CM | POA: Diagnosis not present

## 2023-04-04 DIAGNOSIS — Z87440 Personal history of urinary (tract) infections: Secondary | ICD-10-CM | POA: Diagnosis not present

## 2023-04-04 DIAGNOSIS — I2510 Atherosclerotic heart disease of native coronary artery without angina pectoris: Secondary | ICD-10-CM | POA: Diagnosis not present

## 2023-04-04 DIAGNOSIS — Z7901 Long term (current) use of anticoagulants: Secondary | ICD-10-CM | POA: Diagnosis not present

## 2023-04-04 DIAGNOSIS — J9622 Acute and chronic respiratory failure with hypercapnia: Secondary | ICD-10-CM | POA: Diagnosis not present

## 2023-04-04 DIAGNOSIS — M5441 Lumbago with sciatica, right side: Secondary | ICD-10-CM | POA: Diagnosis not present

## 2023-04-04 DIAGNOSIS — J449 Chronic obstructive pulmonary disease, unspecified: Secondary | ICD-10-CM | POA: Diagnosis not present

## 2023-04-04 DIAGNOSIS — J9621 Acute and chronic respiratory failure with hypoxia: Secondary | ICD-10-CM | POA: Diagnosis not present

## 2023-04-06 ENCOUNTER — Encounter: Payer: Self-pay | Admitting: Nurse Practitioner

## 2023-04-11 DIAGNOSIS — I2510 Atherosclerotic heart disease of native coronary artery without angina pectoris: Secondary | ICD-10-CM | POA: Diagnosis not present

## 2023-04-11 DIAGNOSIS — E875 Hyperkalemia: Secondary | ICD-10-CM | POA: Diagnosis not present

## 2023-04-11 DIAGNOSIS — J449 Chronic obstructive pulmonary disease, unspecified: Secondary | ICD-10-CM | POA: Diagnosis not present

## 2023-04-11 DIAGNOSIS — Z87891 Personal history of nicotine dependence: Secondary | ICD-10-CM | POA: Diagnosis not present

## 2023-04-11 DIAGNOSIS — R532 Functional quadriplegia: Secondary | ICD-10-CM | POA: Diagnosis not present

## 2023-04-11 DIAGNOSIS — E785 Hyperlipidemia, unspecified: Secondary | ICD-10-CM | POA: Diagnosis not present

## 2023-04-11 DIAGNOSIS — Z7901 Long term (current) use of anticoagulants: Secondary | ICD-10-CM | POA: Diagnosis not present

## 2023-04-11 DIAGNOSIS — I130 Hypertensive heart and chronic kidney disease with heart failure and stage 1 through stage 4 chronic kidney disease, or unspecified chronic kidney disease: Secondary | ICD-10-CM | POA: Diagnosis not present

## 2023-04-11 DIAGNOSIS — K219 Gastro-esophageal reflux disease without esophagitis: Secondary | ICD-10-CM | POA: Diagnosis not present

## 2023-04-11 DIAGNOSIS — I2699 Other pulmonary embolism without acute cor pulmonale: Secondary | ICD-10-CM | POA: Diagnosis not present

## 2023-04-11 DIAGNOSIS — I5033 Acute on chronic diastolic (congestive) heart failure: Secondary | ICD-10-CM | POA: Diagnosis not present

## 2023-04-11 DIAGNOSIS — J9622 Acute and chronic respiratory failure with hypercapnia: Secondary | ICD-10-CM | POA: Diagnosis not present

## 2023-04-11 DIAGNOSIS — N1831 Chronic kidney disease, stage 3a: Secondary | ICD-10-CM | POA: Diagnosis not present

## 2023-04-11 DIAGNOSIS — E1122 Type 2 diabetes mellitus with diabetic chronic kidney disease: Secondary | ICD-10-CM | POA: Diagnosis not present

## 2023-04-11 DIAGNOSIS — M1990 Unspecified osteoarthritis, unspecified site: Secondary | ICD-10-CM | POA: Diagnosis not present

## 2023-04-11 DIAGNOSIS — J4520 Mild intermittent asthma, uncomplicated: Secondary | ICD-10-CM | POA: Diagnosis not present

## 2023-04-11 DIAGNOSIS — Z9981 Dependence on supplemental oxygen: Secondary | ICD-10-CM | POA: Diagnosis not present

## 2023-04-11 DIAGNOSIS — Z87440 Personal history of urinary (tract) infections: Secondary | ICD-10-CM | POA: Diagnosis not present

## 2023-04-11 DIAGNOSIS — I700 Atherosclerosis of aorta: Secondary | ICD-10-CM | POA: Diagnosis not present

## 2023-04-11 DIAGNOSIS — G8929 Other chronic pain: Secondary | ICD-10-CM | POA: Diagnosis not present

## 2023-04-11 DIAGNOSIS — J9621 Acute and chronic respiratory failure with hypoxia: Secondary | ICD-10-CM | POA: Diagnosis not present

## 2023-04-11 DIAGNOSIS — M5441 Lumbago with sciatica, right side: Secondary | ICD-10-CM | POA: Diagnosis not present

## 2023-04-11 DIAGNOSIS — J432 Centrilobular emphysema: Secondary | ICD-10-CM | POA: Diagnosis not present

## 2023-04-11 NOTE — Telephone Encounter (Signed)
Done

## 2023-04-16 DIAGNOSIS — I2699 Other pulmonary embolism without acute cor pulmonale: Secondary | ICD-10-CM | POA: Diagnosis not present

## 2023-04-16 DIAGNOSIS — J4520 Mild intermittent asthma, uncomplicated: Secondary | ICD-10-CM | POA: Diagnosis not present

## 2023-04-16 DIAGNOSIS — G8929 Other chronic pain: Secondary | ICD-10-CM | POA: Diagnosis not present

## 2023-04-16 DIAGNOSIS — Z9981 Dependence on supplemental oxygen: Secondary | ICD-10-CM | POA: Diagnosis not present

## 2023-04-16 DIAGNOSIS — K219 Gastro-esophageal reflux disease without esophagitis: Secondary | ICD-10-CM | POA: Diagnosis not present

## 2023-04-16 DIAGNOSIS — N1831 Chronic kidney disease, stage 3a: Secondary | ICD-10-CM | POA: Diagnosis not present

## 2023-04-16 DIAGNOSIS — J9621 Acute and chronic respiratory failure with hypoxia: Secondary | ICD-10-CM | POA: Diagnosis not present

## 2023-04-16 DIAGNOSIS — R532 Functional quadriplegia: Secondary | ICD-10-CM | POA: Diagnosis not present

## 2023-04-16 DIAGNOSIS — E785 Hyperlipidemia, unspecified: Secondary | ICD-10-CM | POA: Diagnosis not present

## 2023-04-16 DIAGNOSIS — E875 Hyperkalemia: Secondary | ICD-10-CM | POA: Diagnosis not present

## 2023-04-16 DIAGNOSIS — I700 Atherosclerosis of aorta: Secondary | ICD-10-CM | POA: Diagnosis not present

## 2023-04-16 DIAGNOSIS — I2510 Atherosclerotic heart disease of native coronary artery without angina pectoris: Secondary | ICD-10-CM | POA: Diagnosis not present

## 2023-04-16 DIAGNOSIS — I5033 Acute on chronic diastolic (congestive) heart failure: Secondary | ICD-10-CM | POA: Diagnosis not present

## 2023-04-16 DIAGNOSIS — Z7901 Long term (current) use of anticoagulants: Secondary | ICD-10-CM | POA: Diagnosis not present

## 2023-04-16 DIAGNOSIS — Z87891 Personal history of nicotine dependence: Secondary | ICD-10-CM | POA: Diagnosis not present

## 2023-04-16 DIAGNOSIS — M1990 Unspecified osteoarthritis, unspecified site: Secondary | ICD-10-CM | POA: Diagnosis not present

## 2023-04-16 DIAGNOSIS — J432 Centrilobular emphysema: Secondary | ICD-10-CM | POA: Diagnosis not present

## 2023-04-16 DIAGNOSIS — Z87440 Personal history of urinary (tract) infections: Secondary | ICD-10-CM | POA: Diagnosis not present

## 2023-04-16 DIAGNOSIS — M5441 Lumbago with sciatica, right side: Secondary | ICD-10-CM | POA: Diagnosis not present

## 2023-04-16 DIAGNOSIS — J9622 Acute and chronic respiratory failure with hypercapnia: Secondary | ICD-10-CM | POA: Diagnosis not present

## 2023-04-16 DIAGNOSIS — J449 Chronic obstructive pulmonary disease, unspecified: Secondary | ICD-10-CM | POA: Diagnosis not present

## 2023-04-16 DIAGNOSIS — I130 Hypertensive heart and chronic kidney disease with heart failure and stage 1 through stage 4 chronic kidney disease, or unspecified chronic kidney disease: Secondary | ICD-10-CM | POA: Diagnosis not present

## 2023-04-16 DIAGNOSIS — E1122 Type 2 diabetes mellitus with diabetic chronic kidney disease: Secondary | ICD-10-CM | POA: Diagnosis not present

## 2023-04-17 DIAGNOSIS — J4520 Mild intermittent asthma, uncomplicated: Secondary | ICD-10-CM | POA: Diagnosis not present

## 2023-04-17 DIAGNOSIS — E785 Hyperlipidemia, unspecified: Secondary | ICD-10-CM | POA: Diagnosis not present

## 2023-04-17 DIAGNOSIS — R532 Functional quadriplegia: Secondary | ICD-10-CM | POA: Diagnosis not present

## 2023-04-17 DIAGNOSIS — I5033 Acute on chronic diastolic (congestive) heart failure: Secondary | ICD-10-CM | POA: Diagnosis not present

## 2023-04-17 DIAGNOSIS — J9621 Acute and chronic respiratory failure with hypoxia: Secondary | ICD-10-CM | POA: Diagnosis not present

## 2023-04-17 DIAGNOSIS — E875 Hyperkalemia: Secondary | ICD-10-CM | POA: Diagnosis not present

## 2023-04-17 DIAGNOSIS — J9622 Acute and chronic respiratory failure with hypercapnia: Secondary | ICD-10-CM | POA: Diagnosis not present

## 2023-04-17 DIAGNOSIS — Z87440 Personal history of urinary (tract) infections: Secondary | ICD-10-CM | POA: Diagnosis not present

## 2023-04-17 DIAGNOSIS — M1990 Unspecified osteoarthritis, unspecified site: Secondary | ICD-10-CM | POA: Diagnosis not present

## 2023-04-17 DIAGNOSIS — I130 Hypertensive heart and chronic kidney disease with heart failure and stage 1 through stage 4 chronic kidney disease, or unspecified chronic kidney disease: Secondary | ICD-10-CM | POA: Diagnosis not present

## 2023-04-17 DIAGNOSIS — Z9981 Dependence on supplemental oxygen: Secondary | ICD-10-CM | POA: Diagnosis not present

## 2023-04-17 DIAGNOSIS — N1831 Chronic kidney disease, stage 3a: Secondary | ICD-10-CM | POA: Diagnosis not present

## 2023-04-17 DIAGNOSIS — E1122 Type 2 diabetes mellitus with diabetic chronic kidney disease: Secondary | ICD-10-CM | POA: Diagnosis not present

## 2023-04-17 DIAGNOSIS — I2510 Atherosclerotic heart disease of native coronary artery without angina pectoris: Secondary | ICD-10-CM | POA: Diagnosis not present

## 2023-04-17 DIAGNOSIS — I700 Atherosclerosis of aorta: Secondary | ICD-10-CM | POA: Diagnosis not present

## 2023-04-17 DIAGNOSIS — G8929 Other chronic pain: Secondary | ICD-10-CM | POA: Diagnosis not present

## 2023-04-17 DIAGNOSIS — M5441 Lumbago with sciatica, right side: Secondary | ICD-10-CM | POA: Diagnosis not present

## 2023-04-17 DIAGNOSIS — J449 Chronic obstructive pulmonary disease, unspecified: Secondary | ICD-10-CM | POA: Diagnosis not present

## 2023-04-17 DIAGNOSIS — Z7901 Long term (current) use of anticoagulants: Secondary | ICD-10-CM | POA: Diagnosis not present

## 2023-04-17 DIAGNOSIS — J432 Centrilobular emphysema: Secondary | ICD-10-CM | POA: Diagnosis not present

## 2023-04-17 DIAGNOSIS — I2699 Other pulmonary embolism without acute cor pulmonale: Secondary | ICD-10-CM | POA: Diagnosis not present

## 2023-04-17 DIAGNOSIS — K219 Gastro-esophageal reflux disease without esophagitis: Secondary | ICD-10-CM | POA: Diagnosis not present

## 2023-04-17 DIAGNOSIS — Z87891 Personal history of nicotine dependence: Secondary | ICD-10-CM | POA: Diagnosis not present

## 2023-04-25 DIAGNOSIS — I2510 Atherosclerotic heart disease of native coronary artery without angina pectoris: Secondary | ICD-10-CM | POA: Diagnosis not present

## 2023-04-25 DIAGNOSIS — M5441 Lumbago with sciatica, right side: Secondary | ICD-10-CM | POA: Diagnosis not present

## 2023-04-25 DIAGNOSIS — I5033 Acute on chronic diastolic (congestive) heart failure: Secondary | ICD-10-CM | POA: Diagnosis not present

## 2023-04-25 DIAGNOSIS — J449 Chronic obstructive pulmonary disease, unspecified: Secondary | ICD-10-CM | POA: Diagnosis not present

## 2023-04-25 DIAGNOSIS — R532 Functional quadriplegia: Secondary | ICD-10-CM | POA: Diagnosis not present

## 2023-04-25 DIAGNOSIS — Z9981 Dependence on supplemental oxygen: Secondary | ICD-10-CM | POA: Diagnosis not present

## 2023-04-25 DIAGNOSIS — G8929 Other chronic pain: Secondary | ICD-10-CM | POA: Diagnosis not present

## 2023-04-25 DIAGNOSIS — I130 Hypertensive heart and chronic kidney disease with heart failure and stage 1 through stage 4 chronic kidney disease, or unspecified chronic kidney disease: Secondary | ICD-10-CM | POA: Diagnosis not present

## 2023-04-25 DIAGNOSIS — E875 Hyperkalemia: Secondary | ICD-10-CM | POA: Diagnosis not present

## 2023-04-25 DIAGNOSIS — I700 Atherosclerosis of aorta: Secondary | ICD-10-CM | POA: Diagnosis not present

## 2023-04-25 DIAGNOSIS — J432 Centrilobular emphysema: Secondary | ICD-10-CM | POA: Diagnosis not present

## 2023-04-25 DIAGNOSIS — I2699 Other pulmonary embolism without acute cor pulmonale: Secondary | ICD-10-CM | POA: Diagnosis not present

## 2023-04-25 DIAGNOSIS — J4520 Mild intermittent asthma, uncomplicated: Secondary | ICD-10-CM | POA: Diagnosis not present

## 2023-04-25 DIAGNOSIS — J9622 Acute and chronic respiratory failure with hypercapnia: Secondary | ICD-10-CM | POA: Diagnosis not present

## 2023-04-25 DIAGNOSIS — J9621 Acute and chronic respiratory failure with hypoxia: Secondary | ICD-10-CM | POA: Diagnosis not present

## 2023-04-25 DIAGNOSIS — Z87440 Personal history of urinary (tract) infections: Secondary | ICD-10-CM | POA: Diagnosis not present

## 2023-04-25 DIAGNOSIS — M1990 Unspecified osteoarthritis, unspecified site: Secondary | ICD-10-CM | POA: Diagnosis not present

## 2023-04-25 DIAGNOSIS — E1122 Type 2 diabetes mellitus with diabetic chronic kidney disease: Secondary | ICD-10-CM | POA: Diagnosis not present

## 2023-04-25 DIAGNOSIS — E785 Hyperlipidemia, unspecified: Secondary | ICD-10-CM | POA: Diagnosis not present

## 2023-04-25 DIAGNOSIS — Z7901 Long term (current) use of anticoagulants: Secondary | ICD-10-CM | POA: Diagnosis not present

## 2023-04-25 DIAGNOSIS — K219 Gastro-esophageal reflux disease without esophagitis: Secondary | ICD-10-CM | POA: Diagnosis not present

## 2023-04-25 DIAGNOSIS — N1831 Chronic kidney disease, stage 3a: Secondary | ICD-10-CM | POA: Diagnosis not present

## 2023-04-25 DIAGNOSIS — Z87891 Personal history of nicotine dependence: Secondary | ICD-10-CM | POA: Diagnosis not present

## 2023-04-30 DIAGNOSIS — J4520 Mild intermittent asthma, uncomplicated: Secondary | ICD-10-CM | POA: Diagnosis not present

## 2023-04-30 DIAGNOSIS — Z9981 Dependence on supplemental oxygen: Secondary | ICD-10-CM | POA: Diagnosis not present

## 2023-04-30 DIAGNOSIS — I2699 Other pulmonary embolism without acute cor pulmonale: Secondary | ICD-10-CM | POA: Diagnosis not present

## 2023-04-30 DIAGNOSIS — J432 Centrilobular emphysema: Secondary | ICD-10-CM | POA: Diagnosis not present

## 2023-04-30 DIAGNOSIS — E785 Hyperlipidemia, unspecified: Secondary | ICD-10-CM | POA: Diagnosis not present

## 2023-04-30 DIAGNOSIS — J9621 Acute and chronic respiratory failure with hypoxia: Secondary | ICD-10-CM | POA: Diagnosis not present

## 2023-04-30 DIAGNOSIS — Z7901 Long term (current) use of anticoagulants: Secondary | ICD-10-CM | POA: Diagnosis not present

## 2023-04-30 DIAGNOSIS — K219 Gastro-esophageal reflux disease without esophagitis: Secondary | ICD-10-CM | POA: Diagnosis not present

## 2023-04-30 DIAGNOSIS — Z87440 Personal history of urinary (tract) infections: Secondary | ICD-10-CM | POA: Diagnosis not present

## 2023-04-30 DIAGNOSIS — R532 Functional quadriplegia: Secondary | ICD-10-CM | POA: Diagnosis not present

## 2023-04-30 DIAGNOSIS — I700 Atherosclerosis of aorta: Secondary | ICD-10-CM | POA: Diagnosis not present

## 2023-04-30 DIAGNOSIS — E875 Hyperkalemia: Secondary | ICD-10-CM | POA: Diagnosis not present

## 2023-04-30 DIAGNOSIS — J449 Chronic obstructive pulmonary disease, unspecified: Secondary | ICD-10-CM | POA: Diagnosis not present

## 2023-04-30 DIAGNOSIS — I2510 Atherosclerotic heart disease of native coronary artery without angina pectoris: Secondary | ICD-10-CM | POA: Diagnosis not present

## 2023-04-30 DIAGNOSIS — E1122 Type 2 diabetes mellitus with diabetic chronic kidney disease: Secondary | ICD-10-CM | POA: Diagnosis not present

## 2023-04-30 DIAGNOSIS — N1831 Chronic kidney disease, stage 3a: Secondary | ICD-10-CM | POA: Diagnosis not present

## 2023-04-30 DIAGNOSIS — G8929 Other chronic pain: Secondary | ICD-10-CM | POA: Diagnosis not present

## 2023-04-30 DIAGNOSIS — M1990 Unspecified osteoarthritis, unspecified site: Secondary | ICD-10-CM | POA: Diagnosis not present

## 2023-04-30 DIAGNOSIS — J9622 Acute and chronic respiratory failure with hypercapnia: Secondary | ICD-10-CM | POA: Diagnosis not present

## 2023-04-30 DIAGNOSIS — Z87891 Personal history of nicotine dependence: Secondary | ICD-10-CM | POA: Diagnosis not present

## 2023-04-30 DIAGNOSIS — I130 Hypertensive heart and chronic kidney disease with heart failure and stage 1 through stage 4 chronic kidney disease, or unspecified chronic kidney disease: Secondary | ICD-10-CM | POA: Diagnosis not present

## 2023-04-30 DIAGNOSIS — I5033 Acute on chronic diastolic (congestive) heart failure: Secondary | ICD-10-CM | POA: Diagnosis not present

## 2023-04-30 DIAGNOSIS — M5441 Lumbago with sciatica, right side: Secondary | ICD-10-CM | POA: Diagnosis not present

## 2023-05-02 ENCOUNTER — Other Ambulatory Visit: Payer: Self-pay | Admitting: Nurse Practitioner

## 2023-05-02 DIAGNOSIS — I700 Atherosclerosis of aorta: Secondary | ICD-10-CM | POA: Diagnosis not present

## 2023-05-02 DIAGNOSIS — J432 Centrilobular emphysema: Secondary | ICD-10-CM | POA: Diagnosis not present

## 2023-05-02 DIAGNOSIS — N1831 Chronic kidney disease, stage 3a: Secondary | ICD-10-CM | POA: Diagnosis not present

## 2023-05-02 DIAGNOSIS — E875 Hyperkalemia: Secondary | ICD-10-CM | POA: Diagnosis not present

## 2023-05-02 DIAGNOSIS — J449 Chronic obstructive pulmonary disease, unspecified: Secondary | ICD-10-CM | POA: Diagnosis not present

## 2023-05-02 DIAGNOSIS — I5033 Acute on chronic diastolic (congestive) heart failure: Secondary | ICD-10-CM | POA: Diagnosis not present

## 2023-05-02 DIAGNOSIS — J4520 Mild intermittent asthma, uncomplicated: Secondary | ICD-10-CM | POA: Diagnosis not present

## 2023-05-02 DIAGNOSIS — E1122 Type 2 diabetes mellitus with diabetic chronic kidney disease: Secondary | ICD-10-CM | POA: Diagnosis not present

## 2023-05-02 DIAGNOSIS — G8929 Other chronic pain: Secondary | ICD-10-CM | POA: Diagnosis not present

## 2023-05-02 DIAGNOSIS — Z9981 Dependence on supplemental oxygen: Secondary | ICD-10-CM | POA: Diagnosis not present

## 2023-05-02 DIAGNOSIS — Z87440 Personal history of urinary (tract) infections: Secondary | ICD-10-CM | POA: Diagnosis not present

## 2023-05-02 DIAGNOSIS — J9621 Acute and chronic respiratory failure with hypoxia: Secondary | ICD-10-CM | POA: Diagnosis not present

## 2023-05-02 DIAGNOSIS — M5441 Lumbago with sciatica, right side: Secondary | ICD-10-CM | POA: Diagnosis not present

## 2023-05-02 DIAGNOSIS — E785 Hyperlipidemia, unspecified: Secondary | ICD-10-CM | POA: Diagnosis not present

## 2023-05-02 DIAGNOSIS — Z760 Encounter for issue of repeat prescription: Secondary | ICD-10-CM

## 2023-05-02 DIAGNOSIS — I2510 Atherosclerotic heart disease of native coronary artery without angina pectoris: Secondary | ICD-10-CM | POA: Diagnosis not present

## 2023-05-02 DIAGNOSIS — K219 Gastro-esophageal reflux disease without esophagitis: Secondary | ICD-10-CM | POA: Diagnosis not present

## 2023-05-02 DIAGNOSIS — I130 Hypertensive heart and chronic kidney disease with heart failure and stage 1 through stage 4 chronic kidney disease, or unspecified chronic kidney disease: Secondary | ICD-10-CM | POA: Diagnosis not present

## 2023-05-02 DIAGNOSIS — Z87891 Personal history of nicotine dependence: Secondary | ICD-10-CM | POA: Diagnosis not present

## 2023-05-02 DIAGNOSIS — I2699 Other pulmonary embolism without acute cor pulmonale: Secondary | ICD-10-CM | POA: Diagnosis not present

## 2023-05-02 DIAGNOSIS — Z7901 Long term (current) use of anticoagulants: Secondary | ICD-10-CM | POA: Diagnosis not present

## 2023-05-02 DIAGNOSIS — R532 Functional quadriplegia: Secondary | ICD-10-CM | POA: Diagnosis not present

## 2023-05-02 DIAGNOSIS — M1990 Unspecified osteoarthritis, unspecified site: Secondary | ICD-10-CM | POA: Diagnosis not present

## 2023-05-02 DIAGNOSIS — J9622 Acute and chronic respiratory failure with hypercapnia: Secondary | ICD-10-CM | POA: Diagnosis not present

## 2023-05-15 DIAGNOSIS — I5033 Acute on chronic diastolic (congestive) heart failure: Secondary | ICD-10-CM | POA: Diagnosis not present

## 2023-05-15 DIAGNOSIS — G8929 Other chronic pain: Secondary | ICD-10-CM | POA: Diagnosis not present

## 2023-05-15 DIAGNOSIS — J449 Chronic obstructive pulmonary disease, unspecified: Secondary | ICD-10-CM | POA: Diagnosis not present

## 2023-05-15 DIAGNOSIS — R532 Functional quadriplegia: Secondary | ICD-10-CM | POA: Diagnosis not present

## 2023-05-15 DIAGNOSIS — M5441 Lumbago with sciatica, right side: Secondary | ICD-10-CM | POA: Diagnosis not present

## 2023-05-15 DIAGNOSIS — I700 Atherosclerosis of aorta: Secondary | ICD-10-CM | POA: Diagnosis not present

## 2023-05-15 DIAGNOSIS — I130 Hypertensive heart and chronic kidney disease with heart failure and stage 1 through stage 4 chronic kidney disease, or unspecified chronic kidney disease: Secondary | ICD-10-CM | POA: Diagnosis not present

## 2023-05-15 DIAGNOSIS — Z87440 Personal history of urinary (tract) infections: Secondary | ICD-10-CM | POA: Diagnosis not present

## 2023-05-15 DIAGNOSIS — J4520 Mild intermittent asthma, uncomplicated: Secondary | ICD-10-CM | POA: Diagnosis not present

## 2023-05-15 DIAGNOSIS — M1990 Unspecified osteoarthritis, unspecified site: Secondary | ICD-10-CM | POA: Diagnosis not present

## 2023-05-15 DIAGNOSIS — J9622 Acute and chronic respiratory failure with hypercapnia: Secondary | ICD-10-CM | POA: Diagnosis not present

## 2023-05-15 DIAGNOSIS — I2699 Other pulmonary embolism without acute cor pulmonale: Secondary | ICD-10-CM | POA: Diagnosis not present

## 2023-05-15 DIAGNOSIS — E1122 Type 2 diabetes mellitus with diabetic chronic kidney disease: Secondary | ICD-10-CM | POA: Diagnosis not present

## 2023-05-15 DIAGNOSIS — Z9981 Dependence on supplemental oxygen: Secondary | ICD-10-CM | POA: Diagnosis not present

## 2023-05-15 DIAGNOSIS — E875 Hyperkalemia: Secondary | ICD-10-CM | POA: Diagnosis not present

## 2023-05-15 DIAGNOSIS — Z87891 Personal history of nicotine dependence: Secondary | ICD-10-CM | POA: Diagnosis not present

## 2023-05-15 DIAGNOSIS — J432 Centrilobular emphysema: Secondary | ICD-10-CM | POA: Diagnosis not present

## 2023-05-15 DIAGNOSIS — Z7901 Long term (current) use of anticoagulants: Secondary | ICD-10-CM | POA: Diagnosis not present

## 2023-05-15 DIAGNOSIS — E785 Hyperlipidemia, unspecified: Secondary | ICD-10-CM | POA: Diagnosis not present

## 2023-05-15 DIAGNOSIS — I2510 Atherosclerotic heart disease of native coronary artery without angina pectoris: Secondary | ICD-10-CM | POA: Diagnosis not present

## 2023-05-15 DIAGNOSIS — K219 Gastro-esophageal reflux disease without esophagitis: Secondary | ICD-10-CM | POA: Diagnosis not present

## 2023-05-15 DIAGNOSIS — J9621 Acute and chronic respiratory failure with hypoxia: Secondary | ICD-10-CM | POA: Diagnosis not present

## 2023-05-15 DIAGNOSIS — N1831 Chronic kidney disease, stage 3a: Secondary | ICD-10-CM | POA: Diagnosis not present

## 2023-05-16 DIAGNOSIS — K219 Gastro-esophageal reflux disease without esophagitis: Secondary | ICD-10-CM | POA: Diagnosis not present

## 2023-05-16 DIAGNOSIS — I2699 Other pulmonary embolism without acute cor pulmonale: Secondary | ICD-10-CM | POA: Diagnosis not present

## 2023-05-16 DIAGNOSIS — I130 Hypertensive heart and chronic kidney disease with heart failure and stage 1 through stage 4 chronic kidney disease, or unspecified chronic kidney disease: Secondary | ICD-10-CM | POA: Diagnosis not present

## 2023-05-16 DIAGNOSIS — G8929 Other chronic pain: Secondary | ICD-10-CM | POA: Diagnosis not present

## 2023-05-16 DIAGNOSIS — E1122 Type 2 diabetes mellitus with diabetic chronic kidney disease: Secondary | ICD-10-CM | POA: Diagnosis not present

## 2023-05-16 DIAGNOSIS — I5033 Acute on chronic diastolic (congestive) heart failure: Secondary | ICD-10-CM | POA: Diagnosis not present

## 2023-05-16 DIAGNOSIS — J4520 Mild intermittent asthma, uncomplicated: Secondary | ICD-10-CM | POA: Diagnosis not present

## 2023-05-16 DIAGNOSIS — J449 Chronic obstructive pulmonary disease, unspecified: Secondary | ICD-10-CM | POA: Diagnosis not present

## 2023-05-16 DIAGNOSIS — Z7901 Long term (current) use of anticoagulants: Secondary | ICD-10-CM | POA: Diagnosis not present

## 2023-05-16 DIAGNOSIS — Z9981 Dependence on supplemental oxygen: Secondary | ICD-10-CM | POA: Diagnosis not present

## 2023-05-16 DIAGNOSIS — N1831 Chronic kidney disease, stage 3a: Secondary | ICD-10-CM | POA: Diagnosis not present

## 2023-05-16 DIAGNOSIS — I700 Atherosclerosis of aorta: Secondary | ICD-10-CM | POA: Diagnosis not present

## 2023-05-16 DIAGNOSIS — J9622 Acute and chronic respiratory failure with hypercapnia: Secondary | ICD-10-CM | POA: Diagnosis not present

## 2023-05-16 DIAGNOSIS — E785 Hyperlipidemia, unspecified: Secondary | ICD-10-CM | POA: Diagnosis not present

## 2023-05-16 DIAGNOSIS — Z87891 Personal history of nicotine dependence: Secondary | ICD-10-CM | POA: Diagnosis not present

## 2023-05-16 DIAGNOSIS — I2510 Atherosclerotic heart disease of native coronary artery without angina pectoris: Secondary | ICD-10-CM | POA: Diagnosis not present

## 2023-05-16 DIAGNOSIS — J9621 Acute and chronic respiratory failure with hypoxia: Secondary | ICD-10-CM | POA: Diagnosis not present

## 2023-05-16 DIAGNOSIS — M5441 Lumbago with sciatica, right side: Secondary | ICD-10-CM | POA: Diagnosis not present

## 2023-05-16 DIAGNOSIS — R532 Functional quadriplegia: Secondary | ICD-10-CM | POA: Diagnosis not present

## 2023-05-16 DIAGNOSIS — M1990 Unspecified osteoarthritis, unspecified site: Secondary | ICD-10-CM | POA: Diagnosis not present

## 2023-05-16 DIAGNOSIS — Z87440 Personal history of urinary (tract) infections: Secondary | ICD-10-CM | POA: Diagnosis not present

## 2023-05-16 DIAGNOSIS — J432 Centrilobular emphysema: Secondary | ICD-10-CM | POA: Diagnosis not present

## 2023-05-16 DIAGNOSIS — E875 Hyperkalemia: Secondary | ICD-10-CM | POA: Diagnosis not present

## 2023-06-02 DIAGNOSIS — J449 Chronic obstructive pulmonary disease, unspecified: Secondary | ICD-10-CM | POA: Diagnosis not present

## 2023-06-12 ENCOUNTER — Other Ambulatory Visit: Payer: Self-pay | Admitting: Nurse Practitioner

## 2023-06-12 DIAGNOSIS — Z760 Encounter for issue of repeat prescription: Secondary | ICD-10-CM

## 2023-07-02 DIAGNOSIS — J449 Chronic obstructive pulmonary disease, unspecified: Secondary | ICD-10-CM | POA: Diagnosis not present

## 2023-07-05 ENCOUNTER — Other Ambulatory Visit: Payer: Self-pay | Admitting: Nurse Practitioner

## 2023-07-05 DIAGNOSIS — Z760 Encounter for issue of repeat prescription: Secondary | ICD-10-CM

## 2023-07-23 ENCOUNTER — Ambulatory Visit: Payer: Medicare Other | Admitting: Nurse Practitioner

## 2023-08-02 DIAGNOSIS — J449 Chronic obstructive pulmonary disease, unspecified: Secondary | ICD-10-CM | POA: Diagnosis not present

## 2023-08-03 ENCOUNTER — Other Ambulatory Visit: Payer: Self-pay | Admitting: Nurse Practitioner

## 2023-08-03 DIAGNOSIS — Z760 Encounter for issue of repeat prescription: Secondary | ICD-10-CM

## 2023-08-13 ENCOUNTER — Other Ambulatory Visit: Payer: Self-pay | Admitting: Nurse Practitioner

## 2023-08-13 DIAGNOSIS — Z760 Encounter for issue of repeat prescription: Secondary | ICD-10-CM

## 2023-09-02 DIAGNOSIS — J449 Chronic obstructive pulmonary disease, unspecified: Secondary | ICD-10-CM | POA: Diagnosis not present

## 2023-09-10 ENCOUNTER — Ambulatory Visit: Payer: Medicare Other | Admitting: Nurse Practitioner

## 2023-09-11 ENCOUNTER — Other Ambulatory Visit: Payer: Self-pay | Admitting: Nurse Practitioner

## 2023-09-11 DIAGNOSIS — Z760 Encounter for issue of repeat prescription: Secondary | ICD-10-CM

## 2023-09-11 NOTE — Telephone Encounter (Signed)
Last 4/24 and missed  2 appt

## 2023-10-02 DIAGNOSIS — J449 Chronic obstructive pulmonary disease, unspecified: Secondary | ICD-10-CM | POA: Diagnosis not present

## 2023-10-04 ENCOUNTER — Other Ambulatory Visit: Payer: Self-pay | Admitting: Nurse Practitioner

## 2023-10-04 DIAGNOSIS — Z760 Encounter for issue of repeat prescription: Secondary | ICD-10-CM

## 2023-10-08 NOTE — Telephone Encounter (Signed)
Please review last 4/24

## 2023-10-09 NOTE — Telephone Encounter (Signed)
Pt had appt with Thomas Tanner tomorrow

## 2023-10-10 ENCOUNTER — Encounter: Payer: Self-pay | Admitting: Nurse Practitioner

## 2023-10-10 ENCOUNTER — Telehealth (INDEPENDENT_AMBULATORY_CARE_PROVIDER_SITE_OTHER): Payer: Medicare Other | Admitting: Nurse Practitioner

## 2023-10-10 VITALS — Resp 16 | Ht 73.0 in | Wt >= 6400 oz

## 2023-10-10 DIAGNOSIS — Z79899 Other long term (current) drug therapy: Secondary | ICD-10-CM

## 2023-10-10 DIAGNOSIS — M5441 Lumbago with sciatica, right side: Secondary | ICD-10-CM | POA: Diagnosis not present

## 2023-10-10 DIAGNOSIS — J0110 Acute frontal sinusitis, unspecified: Secondary | ICD-10-CM

## 2023-10-10 DIAGNOSIS — G8929 Other chronic pain: Secondary | ICD-10-CM | POA: Diagnosis not present

## 2023-10-10 DIAGNOSIS — L299 Pruritus, unspecified: Secondary | ICD-10-CM

## 2023-10-10 DIAGNOSIS — F419 Anxiety disorder, unspecified: Secondary | ICD-10-CM

## 2023-10-10 DIAGNOSIS — Z760 Encounter for issue of repeat prescription: Secondary | ICD-10-CM

## 2023-10-10 MED ORDER — HYDROXYZINE HCL 25 MG PO TABS: ORAL_TABLET | ORAL | 1 refills | Status: DC

## 2023-10-10 MED ORDER — FAMOTIDINE 20 MG PO TABS: 20.0000 mg | ORAL_TABLET | Freq: Two times a day (BID) | ORAL | 1 refills | Status: DC

## 2023-10-10 MED ORDER — LANSOPRAZOLE 30 MG PO CPDR: DELAYED_RELEASE_CAPSULE | ORAL | 1 refills | Status: DC

## 2023-10-10 MED ORDER — AZITHROMYCIN 250 MG PO TABS: ORAL_TABLET | ORAL | 0 refills | Status: AC

## 2023-10-10 MED ORDER — ROSUVASTATIN CALCIUM 20 MG PO TABS: 20.0000 mg | ORAL_TABLET | Freq: Every day | ORAL | 1 refills | Status: DC

## 2023-10-10 MED ORDER — HYDROCODONE-ACETAMINOPHEN 5-325 MG PO TABS: 1.0000 | ORAL_TABLET | ORAL | 0 refills | Status: DC | PRN

## 2023-10-10 MED ORDER — ALPRAZOLAM 0.25 MG PO TABS: 0.2500 mg | ORAL_TABLET | Freq: Every day | ORAL | 0 refills | Status: DC | PRN

## 2023-10-10 MED ORDER — METOPROLOL SUCCINATE ER 50 MG PO TB24: ORAL_TABLET | ORAL | 1 refills | Status: DC

## 2023-10-10 NOTE — Progress Notes (Cosign Needed)
[facility] [address] [place], [place] [address]  Internal MEDICINE  Office Visit Note  Patient Name: Thomas Tanner  [id number]  [id number]  Date of Service: 10/10/2023  I connected with the patient at 1515 by telephone and verified the patients identity using two identifiers.   I discussed the limitations, risks, security and privacy concerns of performing an evaluation and management service by telephone and the availability of in person appointments. I also discussed with the patient that there may be a patient responsible charge related to the service.  The patient expressed understanding and agrees to proceed.    Chief Complaint  Patient presents with   Telephone Screen    F/u for medication   Telephone Assessment    HPI Thomas Tanner presents for a follow-up visit for medication refills  --due for multiple medication refills  Wants to come into office in january Looking for imogen that will not cost much, plans to pay out of pocket. Having symptoms of sinus infection -- nasal congestion, ear pressure, sinus pain/pressure, runny nose, cough, sore throat, headache.    Current Medication: Outpatient Encounter Medications as of 10/10/2023  Medication Sig   [EXPIRED] azithromycin (ZITHROMAX) 250 MG tablet Take 2 tablets on day 1, then 1 tablet daily on days 2 through 5   cetirizine (ZYRTEC) 10 MG tablet Take 1 tablet (10 mg total) by mouth daily.   furosemide (LASIX) 40 MG tablet Take 1 tablet (40 mg total) by mouth daily.   HYDROcodone-acetaminophen (NORCO/VICODIN) 5-325 MG tablet Take 1 tablet by mouth every 4 (four) hours as needed for severe pain (pain score 7-10).   ipratropium-albuterol (DUONEB) 0.5-2.5 (3) MG/3ML SOLN Take 3 mLs by nebulization every 6 (six) hours as needed.   iron polysaccharides (NIFEREX) 150 MG capsule Take 1 capsule (150 mg total) by mouth daily.   OXYGEN Inhale 2.5 L into the lungs. 24 HRS CONTINUOUS OXYGEN USES [facility]IENT   phenylephrine-shark liver oil-mineral oil-petrolatum (PREPARATION H) 0.25-14-74.9 % rectal ointment Place 1 application rectally 2 (two) times daily as needed for hemorrhoids.   senna-docusate (SENOKOT-S) 8.6-50 MG tablet Take 1 tablet by mouth 2 (two) times daily.   vitamin B-12 1000 MCG tablet Take 1 tablet (1,000 mcg total) by mouth daily.   [DISCONTINUED] albuterol (VENTOLIN HFA) 108 (90 Base) MCG/ACT inhaler Inhale 2 puffs into the lungs every 6 (six) hours as needed for wheezing or shortness of breath.   [DISCONTINUED] ALPRAZolam (XANAX) 0.25 MG tablet Take 1 tablet (0.25 mg total) by mouth daily as needed (severe anxiety). May take 1 additional dose after 2 hours if the first dose is not effective.   [DISCONTINUED] apixaban (ELIQUIS) 5 MG TABS tablet Take 1 tablet (5 mg total) by mouth 2 (two) times daily.   [DISCONTINUED] famotidine (PEPCID) 20 MG tablet Take 1 tablet by mouth twice daily   [DISCONTINUED] hydrOXYzine (ATARAX) 25 MG tablet TAKE 1 TABLET BY MOUTH THREE TIMES DAILY AS NEEDED FOR ITCHING   [DISCONTINUED] lansoprazole (PREVACID) 30 MG capsule TAKE 1 CAPSULE BY MOUTH ONCE DAILY BEFORE BREAKFAST   [DISCONTINUED] metoprolol succinate (TOPROL-XL) 50 MG 24 hr tablet TAKE 1 TABLET BY MOUTH ONCE DAILY WITH OR IMMEDIATELY FOLLOWING A MEAL   [DISCONTINUED] rosuvastatin (CRESTOR) 20 MG tablet Take 1 tablet by mouth once daily   [DISCONTINUED] traMADol (ULTRAM) 50 MG tablet Take 1 tablet (50 mg total) by mouth every 6 (six) hours as needed.   ALPRAZolam (XANAX) 0.25 MG tablet Take 1 tablet (0.25 mg  total) by mouth daily as needed (severe anxiety). May take 1 additional dose after 2 hours if the first dose is not effective.   famotidine (PEPCID) 20 MG tablet Take 1 tablet (20 mg total) by mouth 2 (two) times daily.   hydrOXYzine (ATARAX) 25 MG tablet TAKE 1 TABLET BY MOUTH THREE TIMES DAILY AS NEEDED FOR ITCHING   lansoprazole (PREVACID) 30 MG capsule TAKE 1 CAPSULE BY MOUTH ONCE  DAILY BEFORE BREAKFAST   metoprolol succinate (TOPROL-XL) 50 MG 24 hr tablet Take with or immediately following a meal.   rosuvastatin (CRESTOR) 20 MG tablet Take 1 tablet (20 mg total) by mouth daily.   No facility-administered encounter medications on file as of 10/10/2023.    Surgical History: Past Surgical History:  Procedure Laterality Date   CORONARY/GRAFT ACUTE MI REVASCULARIZATION N/A 12/08/2018   Procedure: Coronary/Graft Acute MI Revascularization;  Surgeon: Alwyn Pea, MD;  Location: [facility];  Service: Cardiovascular;  Laterality: N/A;   INCISION AND DRAINAGE ABSCESS N/A 05/18/2021   Procedure: INCISION AND DRAINAGE SCROTAL ABSCESS, cystoscopy with urethral dilation, and complex catheter placement;  Surgeon: Sondra Come, MD;  Location: [facility];  Service: Urology;  Laterality: N/A;   IRRIGATION AND DEBRIDEMENT ABSCESS N/A 05/20/2021   Procedure: IRRIGATION AND DEBRIDEMENT ABSCESS;  Surgeon: Riki Altes, MD;  Location: [facility];  Service: Urology;  Laterality: N/A;   IRRIGATION AND DEBRIDEMENT ABSCESS N/A 05/21/2021   Procedure: IRRIGATION AND DEBRIDEMENT ABSCESS;  Surgeon: Riki Altes, MD;  Location: [facility];  Service: Urology;  Laterality: N/A;   KIDNEY STONE SURGERY     left arm surgery  1963   LEFT HEART CATH AND CORONARY ANGIOGRAPHY N/A 12/08/2018   Procedure: LEFT HEART CATH AND CORONARY ANGIOGRAPHY;  Surgeon: Alwyn Pea, MD;  Location: [facility];  Service: Cardiovascular;  Laterality: N/A;   PULMONARY THROMBECTOMY N/A 08/03/2020   Procedure: PULMONARY THROMBECTOMY / THROMBOLYSIS;  Surgeon: Annice Needy, MD;  Location: [facility];  Service: Cardiovascular;  Laterality: N/A;   PULMONARY THROMBECTOMY Bilateral 01/08/2023   Procedure: PULMONARY THROMBECTOMY;  Surgeon: Renford Dills, MD;  Location: [facility];  Service: Cardiovascular;  Laterality: Bilateral;   RECTAL EXAM UNDER ANESTHESIA N/A 05/20/2021    Procedure: EXAM UNDER ANESTHESIA-Dressing and Change;  Surgeon: Riki Altes, MD;  Location: [facility];  Service: Urology;  Laterality: N/A;    Medical History: Past Medical History:  Diagnosis Date   Arthritis    CHF (congestive heart failure) (HCC)    COPD (chronic obstructive pulmonary disease) (HCC)    Diabetes (HCC)    Hyperlipidemia    Hypertension    Kidney stone    Obesity    Tachycardia    TIA (transient ischemic attack)     Family History: Family History  Problem Relation Age of Onset   Alzheimer's disease Mother    CAD Father     Social History   Socioeconomic History   Marital status: Married    Spouse name: [name]    Number of children: 3   Years of education: Not on file   Highest education level: Not on file  Occupational History   Not on file  Tobacco Use   Smoking status: Every Day    Current packs/day: 0.00    Types: Cigarettes    Last attempt to quit: 07/13/2020    Years since quitting: 3.4    Passive exposure: Current   Smokeless tobacco: Never  Substance and Sexual Activity   Alcohol use: No   Drug use: No   Sexual activity: Not Currently  Other Topics Concern   Not on file  Social History Narrative   Lives at home with wife , youngest son, grand daughter and great greatson    Social Drivers of Health   Financial Resource Strain: Not on file  Food Insecurity: No Food Insecurity (12/17/2022)   Hunger Vital Sign    Worried About Running Out of Food in the Last Year: Never true    Ran Out of Food in the Last Year: Never true  Transportation Needs: No Transportation Needs (12/17/2022)   PRAPARE - Administrator, Civil Service (Medical): No    Lack of Transportation (Non-Medical): No  Physical Activity: Unknown (12/08/2018)   Exercise Vital Sign    Days of Exercise per Week: 0 days    Minutes of Exercise per Session: Not on file  Stress: No Stress Concern Present (12/08/2018)   [employer] of Occupational Health -  Occupational Stress Questionnaire    Feeling of Stress : Only a little  Social Connections: Moderately Integrated (12/08/2018)   Social Connection and Isolation Panel [NHANES]    Frequency of Communication with Friends and Family: More than three times a week    Frequency of Social Gatherings with Friends and Family: Three times a week    Attends Religious Services: 1 to 4 times per year    Active Member of Clubs or Organizations: No    Attends Banker Meetings: Never    Marital Status: Married  Catering manager Violence: Not At Risk (12/17/2022)   Humiliation, Afraid, Rape, and Kick questionnaire    Fear of Current or Ex-Partner: No    Emotionally Abused: No    Physically Abused: No    Sexually Abused: No      Review of Systems  Constitutional:  Positive for fatigue. Negative for chills and unexpected weight change.  HENT:  Positive for congestion, ear pain, postnasal drip, rhinorrhea, sinus pressure, sinus pain, sneezing and sore throat.   Eyes:  Negative for redness.  Respiratory:  Positive for cough. Negative for chest tightness, shortness of breath and wheezing.   Cardiovascular: Negative.  Negative for chest pain and palpitations.  Gastrointestinal:  Negative for abdominal pain, constipation, diarrhea, nausea and vomiting.  Genitourinary:  Negative for dysuria and frequency.  Musculoskeletal:  Positive for gait problem. Negative for arthralgias, back pain, joint swelling and neck pain.  Skin:  Negative for rash.  Neurological:  Positive for weakness. Negative for tremors and numbness.  Hematological:  Negative for adenopathy. Does not bruise/bleed easily.  Psychiatric/Behavioral:  Negative for behavioral problems (Depression), sleep disturbance and suicidal ideas. The patient is not nervous/anxious.     Vital Signs: Resp 16   Ht 6\' 1"  (1.854 m)   Wt (!) 400 lb (181.4 kg)   BMI 52.77 kg/m    Objective data: He is alert and oriented. No acute distress  noted.     Assessment/Plan: 1. Acute non-recurrent frontal sinusitis (Primary) Zpak prescribed, take until gone  - azithromycin (ZITHROMAX) 250 MG tablet; Take 2 tablets on day 1, then 1 tablet daily on days 2 through 5  Dispense: 6 tablet; Refill: 0  2. Chronic midline low back pain with right-sided sciatica Continue prn hydrocodone as prescribed.  - HYDROcodone-acetaminophen (NORCO/VICODIN) 5-325 MG tablet; Take 1 tablet by mouth every 4 (four) hours as needed for severe pain (pain score 7-10).  Dispense: 30  tablet; Refill: 0  3. Pruritus Continue prn hydroxyzine as prescribed.  - hydrOXYzine (ATARAX) 25 MG tablet; TAKE 1 TABLET BY MOUTH THREE TIMES DAILY AS NEEDED FOR ITCHING  Dispense: 90 tablet; Refill: 1  4. Encounter for medication review Medication list reviewed, updated and refills ordered - lansoprazole (PREVACID) 30 MG capsule; TAKE 1 CAPSULE BY MOUTH ONCE DAILY BEFORE BREAKFAST  Dispense: 90 capsule; Refill: 1 - metoprolol succinate (TOPROL-XL) 50 MG 24 hr tablet; Take with or immediately following a meal.  Dispense: 90 tablet; Refill: 1 - famotidine (PEPCID) 20 MG tablet; Take 1 tablet (20 mg total) by mouth 2 (two) times daily.  Dispense: 180 tablet; Refill: 1 - rosuvastatin (CRESTOR) 20 MG tablet; Take 1 tablet (20 mg total) by mouth daily.  Dispense: 90 tablet; Refill: 1  5. Anxiety Continue prn alprazolam as prescribed  - ALPRAZolam (XANAX) 0.25 MG tablet; Take 1 tablet (0.25 mg total) by mouth daily as needed (severe anxiety). May take 1 additional dose after 2 hours if the first dose is not effective.  Dispense: 30 tablet; Refill: 0   General Counseling: Thomas Tanner verbalizes understanding of the findings of todays visit and agrees with plan of treatment. I have discussed any further diagnostic evaluation that may be needed or ordered today. We also reviewed his medications today. he has been encouraged to call the office with any questions or concerns that should arise  related to todays visit.    No orders of the defined types were placed in this encounter.   Meds ordered this encounter  Medications   hydrOXYzine (ATARAX) 25 MG tablet    Sig: TAKE 1 TABLET BY MOUTH THREE TIMES DAILY AS NEEDED FOR ITCHING    Dispense:  90 tablet    Refill:  1   lansoprazole (PREVACID) 30 MG capsule    Sig: TAKE 1 CAPSULE BY MOUTH ONCE DAILY BEFORE BREAKFAST    Dispense:  90 capsule    Refill:  1   metoprolol succinate (TOPROL-XL) 50 MG 24 hr tablet    Sig: Take with or immediately following a meal.    Dispense:  90 tablet    Refill:  1   ALPRAZolam (XANAX) 0.25 MG tablet    Sig: Take 1 tablet (0.25 mg total) by mouth daily as needed (severe anxiety). May take 1 additional dose after 2 hours if the first dose is not effective.    Dispense:  30 tablet    Refill:  0   famotidine (PEPCID) 20 MG tablet    Sig: Take 1 tablet (20 mg total) by mouth 2 (two) times daily.    Dispense:  180 tablet    Refill:  1   rosuvastatin (CRESTOR) 20 MG tablet    Sig: Take 1 tablet (20 mg total) by mouth daily.    Dispense:  90 tablet    Refill:  1   HYDROcodone-acetaminophen (NORCO/VICODIN) 5-325 MG tablet    Sig: Take 1 tablet by mouth every 4 (four) hours as needed for severe pain (pain score 7-10).    Dispense:  30 tablet    Refill:  0    Fill script today.   azithromycin (ZITHROMAX) 250 MG tablet    Sig: Take 2 tablets on day 1, then 1 tablet daily on days 2 through 5    Dispense:  6 tablet    Refill:  0    Return for previously scheduled, F/U, Thomas Tanner PCP in january.   Total time spent:20 Minutes Time spent includes review  of chart, medications, test results, and follow up plan with the patient.   [place] Cont[place] Substance Database was reviewed by me.  This patient was seen by Sallyanne Kuster, FNP-C in collaboration with Dr. Beverely Risen as a part of collaborative care agreement.   Thomas Tanner R. Tedd Sias, MSN, FNP-C Internal medicine

## 2023-10-24 ENCOUNTER — Other Ambulatory Visit: Payer: Self-pay

## 2023-10-24 MED ORDER — APIXABAN 5 MG PO TABS: 5.0000 mg | ORAL_TABLET | Freq: Two times a day (BID) | ORAL | 5 refills | Status: DC

## 2023-11-02 DIAGNOSIS — J449 Chronic obstructive pulmonary disease, unspecified: Secondary | ICD-10-CM | POA: Diagnosis not present

## 2023-11-18 ENCOUNTER — Telehealth (INDEPENDENT_AMBULATORY_CARE_PROVIDER_SITE_OTHER): Payer: Medicare Other | Admitting: Nurse Practitioner

## 2023-11-18 ENCOUNTER — Encounter: Payer: Self-pay | Admitting: Nurse Practitioner

## 2023-11-18 VITALS — Resp 16 | Ht 73.0 in | Wt >= 6400 oz

## 2023-11-18 DIAGNOSIS — Z1211 Encounter for screening for malignant neoplasm of colon: Secondary | ICD-10-CM | POA: Diagnosis not present

## 2023-11-18 DIAGNOSIS — Z23 Encounter for immunization: Secondary | ICD-10-CM

## 2023-11-18 DIAGNOSIS — Z0000 Encounter for general adult medical examination without abnormal findings: Secondary | ICD-10-CM

## 2023-11-18 DIAGNOSIS — J9611 Chronic respiratory failure with hypoxia: Secondary | ICD-10-CM | POA: Insufficient documentation

## 2023-11-18 DIAGNOSIS — Z1212 Encounter for screening for malignant neoplasm of rectum: Secondary | ICD-10-CM | POA: Diagnosis not present

## 2023-11-18 DIAGNOSIS — Z6841 Body Mass Index (BMI) 40.0 and over, adult: Secondary | ICD-10-CM

## 2023-11-18 DIAGNOSIS — E1169 Type 2 diabetes mellitus with other specified complication: Secondary | ICD-10-CM

## 2023-11-18 DIAGNOSIS — E785 Hyperlipidemia, unspecified: Secondary | ICD-10-CM

## 2023-11-18 MED ORDER — ZOSTER VAC RECOMB ADJUVANTED 50 MCG/0.5ML IM SUSR: 0.5000 mL | Freq: Once | INTRAMUSCULAR | 1 refills | Status: DC | PRN

## 2023-11-18 MED ORDER — PNEUMOCOCCAL 20-VAL CONJ VACC 0.5 ML IM SUSY: 0.5000 mL | PREFILLED_SYRINGE | Freq: Once | INTRAMUSCULAR | 0 refills | Status: DC | PRN

## 2023-11-18 MED ORDER — ALBUTEROL SULFATE HFA 108 (90 BASE) MCG/ACT IN AERS: 2.0000 | INHALATION_SPRAY | Freq: Four times a day (QID) | RESPIRATORY_TRACT | 6 refills | Status: DC | PRN

## 2023-11-18 MED ORDER — TETANUS-DIPHTH-ACELL PERTUSSIS 5-2.5-18.5 LF-MCG/0.5 IM SUSP: 0.5000 mL | Freq: Once | INTRAMUSCULAR | 0 refills | Status: DC | PRN

## 2023-11-18 NOTE — Progress Notes (Signed)
[facility] [address] [place], [place] [address]  Internal MEDICINE  Office Visit Note  Patient Name: Thomas Tanner  [id number]  [id number]  Date of Service: 11/18/2023  I connected with the patient at 1615 by telephone and verified the patients identity using two identifiers.   I discussed the limitations, risks, security and privacy concerns of performing an evaluation and management service by telephone and the availability of in person appointments. I also discussed with the patient that there may be a patient responsible charge related to the service.  The patient expressed understanding and agrees to proceed.    Chief Complaint  Patient presents with   Telephone Screen   Telephone Assessment   Medicare Wellness    HPI Thomas Tanner presents for a medicare annual well visit. Well-appearing 72 y.o. male with hypertension, chronic diastolic CHF, aortic atherosclerosis, chronic respiratory failure with hypoxia, COPD, GERD, chronic back pain with sciatica.  Routine CRC screening: opt for cologuard, has no family history of colon cancer  Overdue for eye and foot exam Labs: due for routine labs  New or worsening pain: chronic pain, no new pains --Functional quadriplegia, patient is wheelchair-bound but is able to perform some ADLs on his own or with minimal assistance.  --Reports severe anxiety anytime he has to leave the house and go to a doctor's appointment or other place.  He has a history of canceling many appointments in the past 12 months due to this severe anxiety.  He is most anxious about falling and not having anybody who is able to pick him up.        11/18/2023    3:38 PM 07/17/2022   10:54 AM 01/18/2020    9:10 AM  MMSE - Mini Mental State Exam  Orientation to time 5 5 5   Orientation to Place 5 5 5   Registration 3 3 3   Attention/ Calculation 0 5 5  Recall 3 3 3   Language- name 2 objects 2 2 2   Language- repeat 1 1 1   Language- follow 3 step  command 3 3 3   Language- read & follow direction 1 1 1   Write a sentence 1 1 1   Copy design 1 1 1   Total score 25 30 30     Medicare Risk at Home - 11/18/23 1629     Any stairs in or around the home? No    Home free of loose throw rugs in walkways, pet beds, electrical cords, etc? Yes    Adequate lighting in your home to reduce risk of falls? Yes    Life alert? No    Use of a cane, walker or w/c? Yes    Grab bars in the bathroom? Yes    Shower chair or bench in shower? Yes    Elevated toilet seat or a handicapped toilet? Yes              Functional Status Survey: Is the patient deaf or have difficulty hearing?: Yes Does the patient have difficulty seeing, even when wearing glasses/contacts?: Yes Does the patient have difficulty concentrating, remembering, or making decisions?: No Does the patient have difficulty walking or climbing stairs?: Yes Does the patient have difficulty dressing or bathing?: Yes Does the patient have difficulty doing errands alone such as visiting a doctor's office or shopping?: No     12/19/2022    8:00 PM 12/20/2022    8:00 AM 12/20/2022    8:00 PM 12/21/2022    8:40 AM 11/18/2023  3:37 PM  Fall Risk  Falls in the past year?     0  Was there an injury with Fall?     0  Fall Risk Category Calculator     0  (RETIRED) Patient Fall Risk Level High fall risk High fall risk High fall risk High fall risk   Patient at Risk for Falls Due to     Impaired mobility;Impaired balance/gait;Medication side effect;History of fall(s)  Fall risk Follow up     Falls evaluation completed;Education provided;Falls prevention discussed       11/18/2023    3:37 PM  Depression screen PHQ 2/9  Decreased Interest 0  Down, Depressed, Hopeless 0  PHQ - 2 Score 0       08/11/2020    3:09 PM  GAD 7 : Generalized Anxiety Score  Nervous, Anxious, on Edge 0  Control/stop worrying 0  Worry too much - different things 0  Trouble relaxing 0  Restless 0  Easily annoyed  or irritable 0  Afraid - awful might happen 0  Total GAD 7 Score 0  Anxiety Difficulty Not difficult at all      Current Medication: Outpatient Encounter Medications as of 11/18/2023  Medication Sig   ALPRAZolam (XANAX) 0.25 MG tablet Take 1 tablet (0.25 mg total) by mouth daily as needed (severe anxiety). May take 1 additional dose after 2 hours if the first dose is not effective.   apixaban (ELIQUIS) 5 MG TABS tablet Take 1 tablet (5 mg total) by mouth 2 (two) times daily.   cetirizine (ZYRTEC) 10 MG tablet Take 1 tablet (10 mg total) by mouth daily.   ELIQUIS 5 MG TABS tablet Take 1 tablet by mouth twice daily   famotidine (PEPCID) 20 MG tablet Take 1 tablet (20 mg total) by mouth 2 (two) times daily.   furosemide (LASIX) 40 MG tablet Take 1 tablet (40 mg total) by mouth daily.   HYDROcodone-acetaminophen (NORCO/VICODIN) 5-325 MG tablet Take 1 tablet by mouth every 4 (four) hours as needed for severe pain (pain score 7-10).   hydrOXYzine (ATARAX) 25 MG tablet TAKE 1 TABLET BY MOUTH THREE TIMES DAILY AS NEEDED FOR ITCHING   ipratropium-albuterol (DUONEB) 0.5-2.5 (3) MG/3ML SOLN Take 3 mLs by nebulization every 6 (six) hours as needed.   iron polysaccharides (NIFEREX) 150 MG capsule Take 1 capsule (150 mg total) by mouth daily.   lansoprazole (PREVACID) 30 MG capsule TAKE 1 CAPSULE BY MOUTH ONCE DAILY BEFORE BREAKFAST   metoprolol succinate (TOPROL-XL) 50 MG 24 hr tablet Take with or immediately following a meal.   OXYGEN Inhale 2.5 L into the lungs. 24 HRS CONTINUOUS OXYGEN USES [employer] [facility]   phenylephrine-shark liver oil-mineral oil-petrolatum (PREPARATION H) 0.25-14-74.9 % rectal ointment Place 1 application rectally 2 (two) times daily as needed for hemorrhoids.   pneumococcal 20-valent conjugate vaccine (PREVNAR 20) 0.5 ML injection Inject 0.5 mLs into the muscle once as needed for up to 1 dose for immunization.   rosuvastatin (CRESTOR) 20 MG tablet Take 1 tablet (20 mg  total) by mouth daily.   senna-docusate (SENOKOT-S) 8.6-50 MG tablet Take 1 tablet by mouth 2 (two) times daily.   Tdap (BOOSTRIX) 5-2.5-18.5 LF-MCG/0.5 injection Inject 0.5 mLs into the muscle once as needed for up to 1 dose for immunization.   vitamin B-12 1000 MCG tablet Take 1 tablet (1,000 mcg total) by mouth daily.   Zoster Vaccine Adjuvanted [facility]) injection Inject 0.5 mLs into the muscle once as needed for up  to 1 dose.   [DISCONTINUED] albuterol (VENTOLIN HFA) 108 (90 Base) MCG/ACT inhaler Inhale 2 puffs into the lungs every 6 (six) hours as needed for wheezing or shortness of breath.   albuterol (VENTOLIN HFA) 108 (90 Base) MCG/ACT inhaler Inhale 2 puffs into the lungs every 6 (six) hours as needed for wheezing or shortness of breath.   No facility-administered encounter medications on file as of 11/18/2023.    Surgical History: Past Surgical History:  Procedure Laterality Date   CORONARY/GRAFT ACUTE MI REVASCULARIZATION N/A 12/08/2018   Procedure: Coronary/Graft Acute MI Revascularization;  Surgeon: Alwyn Pea, MD;  Location: [facility] INVASIVE CV [facility];  Service: Cardiovascular;  Laterality: N/A;   INCISION AND DRAINAGE ABSCESS N/A 05/18/2021   Procedure: INCISION AND DRAINAGE SCROTAL ABSCESS, cystoscopy with urethral dilation, and complex catheter placement;  Surgeon: Sondra Come, MD;  Location: [facility] ORS;  Service: Urology;  Laterality: N/A;   IRRIGATION AND DEBRIDEMENT ABSCESS N/A 05/20/2021   Procedure: IRRIGATION AND DEBRIDEMENT ABSCESS;  Surgeon: Riki Altes, MD;  Location: [facility] ORS;  Service: Urology;  Laterality: N/A;   IRRIGATION AND DEBRIDEMENT ABSCESS N/A 05/21/2021   Procedure: IRRIGATION AND DEBRIDEMENT ABSCESS;  Surgeon: Riki Altes, MD;  Location: [facility] ORS;  Service: Urology;  Laterality: N/A;   KIDNEY STONE SURGERY     left arm surgery  1963   LEFT HEART CATH AND CORONARY ANGIOGRAPHY N/A 12/08/2018   Procedure: LEFT HEART CATH AND CORONARY  ANGIOGRAPHY;  Surgeon: Alwyn Pea, MD;  Location: [facility];  Service: Cardiovascular;  Laterality: N/A;   PULMONARY THROMBECTOMY N/A 08/03/2020   Procedure: PULMONARY THROMBECTOMY / THROMBOLYSIS;  Surgeon: Annice Needy, MD;  Location: [facility];  Service: Cardiovascular;  Laterality: N/A;   PULMONARY THROMBECTOMY Bilateral 01/08/2023   Procedure: PULMONARY THROMBECTOMY;  Surgeon: Renford Dills, MD;  Location: [facility];  Service: Cardiovascular;  Laterality: Bilateral;   RECTAL EXAM UNDER ANESTHESIA N/A 05/20/2021   Procedure: EXAM UNDER ANESTHESIA-Dressing and Change;  Surgeon: Riki Altes, MD;  Location: [facility] ORS;  Service: Urology;  Laterality: N/A;    Medical History: Past Medical History:  Diagnosis Date   Arthritis    CHF (congestive heart failure) (HCC)    COPD (chronic obstructive pulmonary disease) (HCC)    Diabetes (HCC)    Hyperlipidemia    Hypertension    Kidney stone    Obesity    Tachycardia    TIA (transient ischemic attack)     Family History: Family History  Problem Relation Age of Onset   Alzheimer's disease Mother    CAD Father     Social History   Socioeconomic History   Marital status: Married    Spouse name: [name]    Number of children: 3   Years of education: Not on file   Highest education level: Not on file  Occupational History   Not on file  Tobacco Use   Smoking status: Every Day    Current packs/day: 0.00    Types: Cigarettes    Last attempt to quit: 07/13/2020    Years since quitting: 3.3    Passive exposure: Current   Smokeless tobacco: Never  Substance and Sexual Activity   Alcohol use: No   Drug use: No   Sexual activity: Not Currently  Other Topics Concern   Not on file  Social History Narrative   Lives at home with wife , youngest son, grand daughter and great greatson  Social Determinants of Health   Financial Resource Strain: Not on file  Food Insecurity: No Food  Insecurity (12/17/2022)   Hunger Vital Sign    Worried About Running Out of Food in the Last Year: Never true    Ran Out of Food in the Last Year: Never true  Transportation Needs: No Transportation Needs (12/17/2022)   PRAPARE - Administrator, Civil Service (Medical): No    Lack of Transportation (Non-Medical): No  Physical Activity: Unknown (12/08/2018)   Exercise Vital Sign    Days of Exercise per Week: 0 days    Minutes of Exercise per Session: Not on file  Stress: No Stress Concern Present (12/08/2018)   [employer] of Occupational Health - Occupational Stress Questionnaire    Feeling of Stress : Only a little  Social Connections: Moderately Integrated (12/08/2018)   Social Connection and Isolation Panel [NHANES]    Frequency of Communication with Friends and Family: More than three times a week    Frequency of Social Gatherings with Friends and Family: Three times a week    Attends Religious Services: 1 to 4 times per year    Active Member of Clubs or Organizations: No    Attends Banker Meetings: Never    Marital Status: Married  Catering manager Violence: Not At Risk (12/17/2022)   Humiliation, Afraid, Rape, and Kick questionnaire    Fear of Current or Ex-Partner: No    Emotionally Abused: No    Physically Abused: No    Sexually Abused: No      Review of Systems  Constitutional:  Positive for fatigue. Negative for activity change, chills and unexpected weight change.  HENT:  Negative for congestion, postnasal drip, rhinorrhea, sinus pressure, sneezing and sore throat.   Respiratory:  Positive for shortness of breath. Negative for cough and chest tightness.        Intermittent shortness of breath and wheezing. Using rescue inhaler much less often   Cardiovascular:  Negative for chest pain and palpitations.       Blood pressure well controlled. Sees cardiology routinely.   Gastrointestinal:  Negative for abdominal pain, constipation, diarrhea,  nausea and vomiting.  Endocrine: Negative for cold intolerance, heat intolerance, polydipsia and polyuria.  Genitourinary:  Negative for dysuria, frequency and urgency.  Musculoskeletal:  Positive for arthralgias and gait problem. Negative for back pain, joint swelling and neck pain.       Chronic right hip pain. Uses rolling walker or wheelchair  Skin:  Negative for rash.  Allergic/Immunologic: Positive for environmental allergies.  Neurological:  Negative for dizziness, tremors, numbness and headaches.  Hematological:  Negative for adenopathy. Does not bruise/bleed easily.  Psychiatric/Behavioral:  Negative for behavioral problems (Depression), sleep disturbance and suicidal ideas. The patient is not nervous/anxious.     Vital Signs: Resp 16   Ht 6\' 1"  (1.854 m)   Wt (!) 400 lb (181.4 kg)   BMI 52.77 kg/m    Objective data: He is alert and oriented. No acute distress noted.     Assessment/Plan: 1. Encounter for subsequent annual wellness visit (AWV) in Medicare patient Age-appropriate preventive screenings and vaccinations discussed, annual physical exam completed. Routine labs for health maintenance ordered, see below. PHM updated.    2. Chronic respiratory failure with hypoxia (HCC) Continue supplemental oxygen use as needed. Continue albuterol inhaler as needed, refills ordered.  - albuterol (VENTOLIN HFA) 108 (90 Base) MCG/ACT inhaler; Inhale 2 puffs into the lungs every 6 (six) hours as needed  for wheezing or shortness of breath.  Dispense: 18 g; Refill: 6  3. Type 2 diabetes mellitus with other specified complication, without long-term current use of insulin (HCC) Routine labs ordered. Continue diabetic diet. Will repeat A1c with labs.  - CBC with Differential/Platelet - CMP14+EGFR - Lipid Profile - Hgb A1C w/o eAG  4. Hyperlipidemia associated with type 2 diabetes mellitus (HCC) Routine labs ordered. Continue rosuvastatin as prescribed.  - CBC with  Differential/Platelet - CMP14+EGFR - Lipid Profile - Hgb A1C w/o eAG  5. Morbid obesity with BMI of 50.0-59.9, adult [facility]) Not currently physically active which makes it difficult to work on weight loss, tries to eat healthy. Encouraged patient to continue eating healthy diet, low in carbohydrates.   6. Screening for colorectal cancer Cologuard test ordered - Cologuard  7. Need for vaccination - pneumococcal 20-valent conjugate vaccine (PREVNAR 20) 0.5 ML injection; Inject 0.5 mLs into the muscle once as needed for up to 1 dose for immunization.  Dispense: 0.5 mL; Refill: 0 - Zoster Vaccine Adjuvanted [facility]) injection; Inject 0.5 mLs into the muscle once as needed for up to 1 dose.  Dispense: 0.5 mL; Refill: 1 - Tdap (BOOSTRIX) 5-2.5-18.5 LF-MCG/0.5 injection; Inject 0.5 mLs into the muscle once as needed for up to 1 dose for immunization.  Dispense: 0.5 mL; Refill: 0      General Counseling: Thomas Tanner verbalizes understanding of the findings of todays visit and agrees with plan of treatment. I have discussed any further diagnostic evaluation that may be needed or ordered today. We also reviewed his medications today. he has been encouraged to call the office with any questions or concerns that should arise related to todays visit.    Orders Placed This Encounter  Procedures   Cologuard   CBC with Differential/Platelet   CMP14+EGFR   Lipid Profile   Hgb A1C w/o eAG    Meds ordered this encounter  Medications   pneumococcal 20-valent conjugate vaccine (PREVNAR 20) 0.5 ML injection    Sig: Inject 0.5 mLs into the muscle once as needed for up to 1 dose for immunization.    Dispense:  0.5 mL    Refill:  0   Zoster Vaccine Adjuvanted [facility]) injection    Sig: Inject 0.5 mLs into the muscle once as needed for up to 1 dose.    Dispense:  0.5 mL    Refill:  1    Due for 2 dose series   Tdap (BOOSTRIX) 5-2.5-18.5 LF-MCG/0.5 injection    Sig: Inject 0.5 mLs into the muscle  once as needed for up to 1 dose for immunization.    Dispense:  0.5 mL    Refill:  0   albuterol (VENTOLIN HFA) 108 (90 Base) MCG/ACT inhaler    Sig: Inhale 2 puffs into the lungs every 6 (six) hours as needed for wheezing or shortness of breath.    Dispense:  18 g    Refill:  6    Can use generic    Return for previously scheduled, F/U, Thomas Tanner PCP in january.   Total time spent:20 Minutes Time spent includes review of chart, medications, test results, and follow up plan with the patient.   [place] Controlled Substance Database was reviewed by me.  This patient was seen by Sallyanne Kuster, FNP-C in collaboration with Dr. Beverely Risen as a part of collaborative care agreement.  Thomas Tanner R. Tedd Sias, MSN, FNP-C Internal medicine

## 2023-11-28 ENCOUNTER — Telehealth: Payer: Self-pay

## 2023-11-28 NOTE — Telephone Encounter (Signed)
Pt called that he is on donut hole he Eliquis samples as per alyssa gave him samples

## 2023-12-02 DIAGNOSIS — J449 Chronic obstructive pulmonary disease, unspecified: Secondary | ICD-10-CM | POA: Diagnosis not present

## 2023-12-08 ENCOUNTER — Encounter: Payer: Self-pay | Admitting: Nurse Practitioner

## 2023-12-13 ENCOUNTER — Other Ambulatory Visit: Payer: Self-pay | Admitting: Nurse Practitioner

## 2023-12-13 DIAGNOSIS — J3089 Other allergic rhinitis: Secondary | ICD-10-CM

## 2023-12-16 ENCOUNTER — Telehealth (INDEPENDENT_AMBULATORY_CARE_PROVIDER_SITE_OTHER): Payer: Medicare Other | Admitting: Nurse Practitioner

## 2023-12-16 ENCOUNTER — Encounter: Payer: Self-pay | Admitting: Nurse Practitioner

## 2023-12-16 VITALS — Resp 16 | Ht 73.0 in | Wt >= 6400 oz

## 2023-12-16 DIAGNOSIS — E1169 Type 2 diabetes mellitus with other specified complication: Secondary | ICD-10-CM | POA: Diagnosis not present

## 2023-12-16 DIAGNOSIS — E785 Hyperlipidemia, unspecified: Secondary | ICD-10-CM | POA: Diagnosis not present

## 2023-12-16 DIAGNOSIS — K219 Gastro-esophageal reflux disease without esophagitis: Secondary | ICD-10-CM

## 2023-12-16 MED ORDER — FAMOTIDINE 40 MG PO TABS: 40.0000 mg | ORAL_TABLET | Freq: Every day | ORAL | 1 refills | Status: DC

## 2023-12-16 NOTE — Progress Notes (Signed)
 [facility] [address] [place], [place] [address]  Internal MEDICINE  Telephone Visit  Patient Name: Thomas Tanner  [id number]  [id number]  Date of Service: 12/16/2023  I connected with the patient at 0835 by telephone and verified the patients identity using two identifiers.   I discussed the limitations, risks, security and privacy concerns of performing an evaluation and management service by telephone and the availability of in person appointments. I also discussed with the patient that there may be a patient responsible charge related to the service.  The patient expressed understanding and agrees to proceed.    Chief Complaint  Patient presents with   Telephone Screen    F/u    Telephone Assessment    HPI Thomas Tanner presents for a telehealth virtual visit for routine follow up Unable to come in the office due to inclement weather.  -- has a Dull abdominal pain that comes and goes. Does not happen with specific foods. Increased flatulence.  --also having headaches that may be due to sinus issues but they are not bothering him enough to take any medication. He is on cetirizine  for allergic rhinitis.   Current Medication: Outpatient Encounter Medications as of 12/16/2023  Medication Sig   albuterol  (VENTOLIN  HFA) 108 (90 Base) MCG/ACT inhaler Inhale 2 puffs into the lungs every 6 (six) hours as needed for wheezing or shortness of breath.   ALPRAZolam  (XANAX ) 0.25 MG tablet Take 1 tablet (0.25 mg total) by mouth daily as needed (severe anxiety). May take 1 additional dose after 2 hours if the first dose is not effective.   cetirizine  (ZYRTEC ) 10 MG tablet Take 1 tablet by mouth once daily   ELIQUIS  5 MG TABS tablet Take 1 tablet by mouth twice daily   famotidine  (PEPCID ) 40 MG tablet Take 1 tablet (40 mg total) by mouth daily.   furosemide  (LASIX ) 40 MG tablet Take 1 tablet (40 mg total) by mouth daily.   HYDROcodone -acetaminophen  (NORCO/VICODIN) 5-325 MG  tablet Take 1 tablet by mouth every 4 (four) hours as needed for severe pain (pain score 7-10).   hydrOXYzine  (ATARAX ) 25 MG tablet TAKE 1 TABLET BY MOUTH THREE TIMES DAILY AS NEEDED FOR ITCHING   ipratropium-albuterol  (DUONEB) 0.5-2.5 (3) MG/3ML SOLN Take 3 mLs by nebulization every 6 (six) hours as needed.   iron  polysaccharides (NIFEREX) 150 MG capsule Take 1 capsule (150 mg total) by mouth daily.   lansoprazole  (PREVACID ) 30 MG capsule TAKE 1 CAPSULE BY MOUTH ONCE DAILY BEFORE BREAKFAST   metoprolol  succinate (TOPROL -XL) 50 MG 24 hr tablet Take with or immediately following a meal.   OXYGEN  Inhale 2.5 L into the lungs. 24 HRS CONTINUOUS OXYGEN  USES [facility]   phenylephrine -shark liver oil-mineral oil-petrolatum  (PREPARATION H) 0.25-14-74.9 % rectal ointment Place 1 application rectally 2 (two) times daily as needed for hemorrhoids.   pneumococcal 20-valent conjugate vaccine (PREVNAR 20) 0.5 ML injection Inject 0.5 mLs into the muscle once as needed for up to 1 dose for immunization.   rosuvastatin  (CRESTOR ) 20 MG tablet Take 1 tablet (20 mg total) by mouth daily.   senna-docusate (SENOKOT-S) 8.6-50 MG tablet Take 1 tablet by mouth 2 (two) times daily.   Tdap (BOOSTRIX) 5-2.5-18.5 LF-MCG/0.5 injection Inject 0.5 mLs into the muscle once as needed for up to 1 dose for immunization.   vitamin B-12 1000 MCG tablet Take 1 tablet (1,000 mcg total) by mouth daily.   Zoster Vaccine Adjuvanted [facility]) injection Inject 0.5 mLs into the muscle once  as needed for up to 1 dose.   [DISCONTINUED] apixaban  (ELIQUIS ) 5 MG TABS tablet Take 1 tablet (5 mg total) by mouth 2 (two) times daily.   [DISCONTINUED] famotidine  (PEPCID ) 20 MG tablet Take 1 tablet (20 mg total) by mouth 2 (two) times daily.   No facility-administered encounter medications on file as of 12/16/2023.    Surgical History: Past Surgical History:  Procedure Laterality Date   CORONARY/GRAFT ACUTE MI REVASCULARIZATION N/A  12/08/2018   Procedure: Coronary/Graft Acute MI Revascularization;  Surgeon: Thomas Cara BIRCH, MD;  Location: [facility];  Service: Cardiovascular;  Laterality: N/A;   INCISION AND DRAINAGE ABSCESS N/A 05/18/2021   Procedure: INCISION AND DRAINAGE SCROTAL ABSCESS, cystoscopy with urethral dilation, and complex catheter placement;  Surgeon: Thomas Redell BROCKS, MD;  Location: [facility] ORS;  Service: Urology;  Laterality: N/A;   IRRIGATION AND DEBRIDEMENT ABSCESS N/A 05/20/2021   Procedure: IRRIGATION AND DEBRIDEMENT ABSCESS;  Surgeon: Thomas Glendia BROCKS, MD;  Location: [facility] ORS;  Service: Urology;  Laterality: N/A;   IRRIGATION AND DEBRIDEMENT ABSCESS N/A 05/21/2021   Procedure: IRRIGATION AND DEBRIDEMENT ABSCESS;  Surgeon: Thomas Glendia BROCKS, MD;  Location: [facility] OR[facility];  Service: Urology;  Laterality: N/A;   KIDNEY STONE SURGERY     left arm surgery  1963   LEFT HEART CATH AND CORONARY ANGIOGRAPHY N/A 12/08/2018   Procedure: LEFT HEART CATH AND CORONARY ANGIOGRAPHY;  Surgeon: Thomas Cara BIRCH, MD;  Location: [facility];  Service: Cardiovascular;  Laterality: N/A;   PULMONARY THROMBECTOMY N/A 08/03/2020   Procedure: PULMONARY THROMBECTOMY / THROMBOLYSIS;  Surgeon: Thomas Selinda RAMAN, MD;  Location: [facility];  Service: Cardiovascular;  Laterality: N/A;   PULMONARY THROMBECTOMY Bilateral 01/08/2023   Procedure: PULMONARY THROMBECTOMY;  Surgeon: Thomas Cordella MATSU, MD;  Location: [facility];  Service: Cardiovascular;  Laterality: Bilateral;   RECTAL EXAM UNDER ANESTHESIA N/A 05/20/2021   Procedure: EXAM UNDER ANESTHESIA-Dressing and Change;  Surgeon: Thomas Glendia BROCKS, MD;  Location: [facility] OR[facility];  Service: Urology;  Laterality: N/A;    Medical History: Past Medical History:  Diagnosis Date   Arthritis    CHF (congestive heart failure) (HCC)    COPD (chronic obstructive pulmonary disease) (HCC)    Diabetes (HCC)    Hyperlipidemia    Hypertension    Kidney stone    Obesity     Tachycardia    TIA (transient ischemic attack)     Family History: Family History  Problem Relation Age of Onset   Alzheimer's disease Mother    CAD Father     Social History   Socioeconomic History   Marital status: Married    Spouse name: [name]    Number of children: 3   Years of education: Not on file   Highest education level: Not on file  Occupational History   Not on file  Tobacco Use   Smoking status: Every Day    Current packs/day: 0.00    Types: Cigarettes    Last attempt to quit: 07/13/2020    Years since quitting: 3.4    Passive exposure: Current   Smokeless tobacco: Never  Substance and Sexual Activity   Alcohol  use: No   Drug use: No   Sexual activity: Not Currently  Other Topics Concern   Not on file  Social History Narrative   Lives at home with wife , youngest son, grand daughter and great greatson    Social Drivers of Corporate Investment Banker Strain: Not on  file  Food Insecurity: No Food Insecurity (12/17/2022)   Hunger Vital Sign    Worried About Running Out of Food in the Last Year: Never true    Ran Out of Food in the Last Year: Never true  Transportation Needs: No Transportation Needs (12/17/2022)   PRAPARE - Administrator, Civil Service (Medical): No    Lack of Transportation (Non-Medical): No  Physical Activity: Unknown (12/08/2018)   Exercise Vital Sign    Days of Exercise per Week: 0 days    Minutes of Exercise per Session: Not on file  Stress: No Stress Concern Present (12/08/2018)   [employer] of Occupational Health - Occupational Stress Questionnaire    Feeling of Stress : Only a little  Social Connections: Moderately Integrated (12/08/2018)   Social Connection and Isolation Panel [NHANES]    Frequency of Communication with Friends and Family: More than three times a week    Frequency of Social Gatherings with Friends and Family: Three times a week    Attends Religious Services: 1 to 4 times per year    Active  Member of Clubs or Organizations: No    Attends Banker Meetings: Never    Marital Status: Married  Catering Manager Violence: Not At Risk (12/17/2022)   Humiliation, Afraid, Rape, and Kick questionnaire    Fear of Current or Ex-Partner: No    Emotionally Abused: No    Physically Abused: No    Sexually Abused: No      Review of Systems  Constitutional: Negative.  Negative for fatigue.  HENT:  Positive for congestion, postnasal drip, rhinorrhea and sinus pressure.        Sinus headaches.  Also reports soreness on back of ear where his oxygen  tubing is rubbing against his skin.   Respiratory:  Positive for shortness of breath (intermittent). Negative for cough, chest tightness and wheezing.   Cardiovascular: Negative.  Negative for chest pain and palpitations.  Gastrointestinal:  Positive for abdominal pain (dull).       Increased flatulence    Vital Signs: Resp 16   Ht 6' 1 (1.854 m)   Wt (!) 415 lb (188.2 kg)   BMI 54.75 kg/m    Observation/Objective: He is alert and oriented. No acute distress noted.     Assessment/Plan: 1. Type 2 diabetes mellitus with other specified complication, without long-term current use of insulin  (HCC) (Primary) Continue medications as prescribed, overdue for A1c check, will do this at next in person visit in 3 months   2. Hyperlipidemia associated with type 2 diabetes mellitus (HCC) Continue rosuvastatin  as prescribed   3. Gastroesophageal reflux disease without esophagitis Famotidine  dose increased.  - famotidine  (PEPCID ) 40 MG tablet; Take 1 tablet (40 mg total) by mouth daily.  Dispense: 180 tablet; Refill: 1   General Counseling: Branton verbalizes understanding of the findings of today's phone visit and agrees with plan of treatment. I have discussed any further diagnostic evaluation that may be needed or ordered today. We also reviewed his medications today. he has been encouraged to call the office with any questions or  concerns that should arise related to todays visit.  Return in about 3 months (around 03/18/2024) for F/U, Carrine Kroboth PCP, Recheck A1C.   No orders of the defined types were placed in this encounter.   Meds ordered this encounter  Medications   famotidine  (PEPCID ) 40 MG tablet    Sig: Take 1 tablet (40 mg total) by mouth daily.    Dispense:  180 tablet    Refill:  1    Not increased dose,fill new script and discontinue 20 mg tablet.    Time spent:10 Minutes Time spent with patient included reviewing progress notes, labs, imaging studies, and discussing plan for follow up.  [place] Controlled Substance Database was reviewed by me for overdose risk score (ORS) if appropriate.  This patient was seen by Mardy Maxin, FNP-C in collaboration with Dr. Sigrid Bathe as a part of collaborative care agreement.  Chrisy Hillebrand R. Maxin, MSN, FNP-C Internal medicine

## 2024-01-02 DIAGNOSIS — J449 Chronic obstructive pulmonary disease, unspecified: Secondary | ICD-10-CM | POA: Diagnosis not present

## 2024-02-02 DIAGNOSIS — J449 Chronic obstructive pulmonary disease, unspecified: Secondary | ICD-10-CM | POA: Diagnosis not present

## 2024-02-12 ENCOUNTER — Telehealth: Payer: Self-pay

## 2024-02-13 MED ORDER — AZITHROMYCIN 250 MG PO TABS: ORAL_TABLET | ORAL | 0 refills | Status: AC

## 2024-02-13 NOTE — Telephone Encounter (Signed)
 Pt advised we sent zpak

## 2024-02-13 NOTE — Telephone Encounter (Signed)
 Try to call pt no voicemail that we sent zpak if not feeling better need appt

## 2024-02-24 ENCOUNTER — Emergency Department

## 2024-02-24 ENCOUNTER — Other Ambulatory Visit: Payer: Self-pay

## 2024-02-24 ENCOUNTER — Inpatient Hospital Stay
Admission: EM | Admit: 2024-02-24 | Discharge: 2024-02-29 | DRG: 291 | Disposition: A | Discharge status: Skilled Nursing Facility | Attending: Internal Medicine | Admitting: Internal Medicine

## 2024-02-24 DIAGNOSIS — J9621 Acute and chronic respiratory failure with hypoxia: Secondary | ICD-10-CM | POA: Diagnosis not present

## 2024-02-24 DIAGNOSIS — Z87442 Personal history of urinary calculi: Secondary | ICD-10-CM

## 2024-02-24 DIAGNOSIS — D509 Iron deficiency anemia, unspecified: Secondary | ICD-10-CM | POA: Diagnosis not present

## 2024-02-24 DIAGNOSIS — Z8673 Personal history of transient ischemic attack (TIA), and cerebral infarction without residual deficits: Secondary | ICD-10-CM

## 2024-02-24 DIAGNOSIS — J90 Pleural effusion, not elsewhere classified: Secondary | ICD-10-CM | POA: Diagnosis not present

## 2024-02-24 DIAGNOSIS — Z79899 Other long term (current) drug therapy: Secondary | ICD-10-CM | POA: Diagnosis not present

## 2024-02-24 DIAGNOSIS — I10 Essential (primary) hypertension: Secondary | ICD-10-CM | POA: Diagnosis present

## 2024-02-24 DIAGNOSIS — J9601 Acute respiratory failure with hypoxia: Secondary | ICD-10-CM | POA: Diagnosis not present

## 2024-02-24 DIAGNOSIS — J441 Chronic obstructive pulmonary disease with (acute) exacerbation: Secondary | ICD-10-CM | POA: Diagnosis not present

## 2024-02-24 DIAGNOSIS — E1169 Type 2 diabetes mellitus with other specified complication: Secondary | ICD-10-CM | POA: Diagnosis present

## 2024-02-24 DIAGNOSIS — E66813 Obesity, class 3: Secondary | ICD-10-CM | POA: Diagnosis present

## 2024-02-24 DIAGNOSIS — Z1152 Encounter for screening for COVID-19: Secondary | ICD-10-CM

## 2024-02-24 DIAGNOSIS — K219 Gastro-esophageal reflux disease without esophagitis: Secondary | ICD-10-CM | POA: Diagnosis present

## 2024-02-24 DIAGNOSIS — E785 Hyperlipidemia, unspecified: Secondary | ICD-10-CM | POA: Diagnosis present

## 2024-02-24 DIAGNOSIS — R0902 Hypoxemia: Secondary | ICD-10-CM | POA: Diagnosis not present

## 2024-02-24 DIAGNOSIS — Z6841 Body Mass Index (BMI) 40.0 and over, adult: Secondary | ICD-10-CM | POA: Diagnosis not present

## 2024-02-24 DIAGNOSIS — Z8249 Family history of ischemic heart disease and other diseases of the circulatory system: Secondary | ICD-10-CM

## 2024-02-24 DIAGNOSIS — R0602 Shortness of breath: Principal | ICD-10-CM

## 2024-02-24 DIAGNOSIS — Z794 Long term (current) use of insulin: Secondary | ICD-10-CM

## 2024-02-24 DIAGNOSIS — I110 Hypertensive heart disease with heart failure: Principal | ICD-10-CM | POA: Diagnosis present

## 2024-02-24 DIAGNOSIS — F419 Anxiety disorder, unspecified: Secondary | ICD-10-CM

## 2024-02-24 DIAGNOSIS — Z7901 Long term (current) use of anticoagulants: Secondary | ICD-10-CM | POA: Diagnosis not present

## 2024-02-24 DIAGNOSIS — I2510 Atherosclerotic heart disease of native coronary artery without angina pectoris: Secondary | ICD-10-CM | POA: Diagnosis not present

## 2024-02-24 DIAGNOSIS — Z881 Allergy status to other antibiotic agents status: Secondary | ICD-10-CM

## 2024-02-24 DIAGNOSIS — J9622 Acute and chronic respiratory failure with hypercapnia: Secondary | ICD-10-CM | POA: Diagnosis not present

## 2024-02-24 DIAGNOSIS — Z720 Tobacco use: Secondary | ICD-10-CM | POA: Diagnosis present

## 2024-02-24 DIAGNOSIS — E869 Volume depletion, unspecified: Secondary | ICD-10-CM | POA: Diagnosis not present

## 2024-02-24 DIAGNOSIS — I5033 Acute on chronic diastolic (congestive) heart failure: Secondary | ICD-10-CM | POA: Diagnosis present

## 2024-02-24 DIAGNOSIS — G501 Atypical facial pain: Secondary | ICD-10-CM | POA: Diagnosis not present

## 2024-02-24 DIAGNOSIS — Z9981 Dependence on supplemental oxygen: Secondary | ICD-10-CM | POA: Diagnosis not present

## 2024-02-24 DIAGNOSIS — I6782 Cerebral ischemia: Secondary | ICD-10-CM | POA: Diagnosis not present

## 2024-02-24 DIAGNOSIS — I700 Atherosclerosis of aorta: Secondary | ICD-10-CM | POA: Diagnosis present

## 2024-02-24 DIAGNOSIS — F17210 Nicotine dependence, cigarettes, uncomplicated: Secondary | ICD-10-CM | POA: Diagnosis not present

## 2024-02-24 DIAGNOSIS — Z993 Dependence on wheelchair: Secondary | ICD-10-CM

## 2024-02-24 DIAGNOSIS — M7989 Other specified soft tissue disorders: Secondary | ICD-10-CM | POA: Diagnosis not present

## 2024-02-24 DIAGNOSIS — M79673 Pain in unspecified foot: Secondary | ICD-10-CM | POA: Diagnosis not present

## 2024-02-24 DIAGNOSIS — R29810 Facial weakness: Secondary | ICD-10-CM | POA: Diagnosis not present

## 2024-02-24 DIAGNOSIS — E538 Deficiency of other specified B group vitamins: Secondary | ICD-10-CM | POA: Diagnosis not present

## 2024-02-24 DIAGNOSIS — R519 Headache, unspecified: Secondary | ICD-10-CM

## 2024-02-24 DIAGNOSIS — Z716 Tobacco abuse counseling: Secondary | ICD-10-CM

## 2024-02-24 DIAGNOSIS — J9811 Atelectasis: Secondary | ICD-10-CM | POA: Diagnosis not present

## 2024-02-24 DIAGNOSIS — R059 Cough, unspecified: Secondary | ICD-10-CM

## 2024-02-24 DIAGNOSIS — Z885 Allergy status to narcotic agent status: Secondary | ICD-10-CM

## 2024-02-24 DIAGNOSIS — R918 Other nonspecific abnormal finding of lung field: Secondary | ICD-10-CM | POA: Diagnosis not present

## 2024-02-24 DIAGNOSIS — Z86711 Personal history of pulmonary embolism: Secondary | ICD-10-CM

## 2024-02-24 LAB — RESP PANEL BY RT-PCR (RSV, FLU A&B, COVID)  RVPGX2
Influenza A by PCR: NEGATIVE
Influenza B by PCR: NEGATIVE
Resp Syncytial Virus by PCR: NEGATIVE
SARS Coronavirus 2 by RT PCR: NEGATIVE

## 2024-02-24 LAB — CBC WITH DIFFERENTIAL/PLATELET
Abs Immature Granulocytes: 0.02 10*3/uL (ref 0.00–0.07)
Basophils Absolute: 0 10*3/uL (ref 0.0–0.1)
Basophils Relative: 0 %
Eosinophils Absolute: 0 10*3/uL (ref 0.0–0.5)
Eosinophils Relative: 0 %
HCT: 32.3 % — ABNORMAL LOW (ref 39.0–52.0)
Hemoglobin: 8.9 g/dL — ABNORMAL LOW (ref 13.0–17.0)
Immature Granulocytes: 0 %
Lymphocytes Relative: 11 %
Lymphs Abs: 0.7 10*3/uL (ref 0.7–4.0)
MCH: 23.2 pg — ABNORMAL LOW (ref 26.0–34.0)
MCHC: 27.6 g/dL — ABNORMAL LOW (ref 30.0–36.0)
MCV: 84.3 fL (ref 80.0–100.0)
Monocytes Absolute: 0.6 10*3/uL (ref 0.1–1.0)
Monocytes Relative: 9 %
Neutro Abs: 5.3 10*3/uL (ref 1.7–7.7)
Neutrophils Relative %: 80 %
Platelets: 189 10*3/uL (ref 150–400)
RBC: 3.83 MIL/uL — ABNORMAL LOW (ref 4.22–5.81)
RDW: 18.3 % — ABNORMAL HIGH (ref 11.5–15.5)
WBC: 6.7 10*3/uL (ref 4.0–10.5)
nRBC: 0 % (ref 0.0–0.2)

## 2024-02-24 LAB — D-DIMER, QUANTITATIVE: D-Dimer, Quant: 0.88 ug{FEU}/mL — ABNORMAL HIGH (ref 0.00–0.50)

## 2024-02-24 LAB — COMPREHENSIVE METABOLIC PANEL
ALT: 7 U/L (ref 0–44)
AST: 14 U/L — ABNORMAL LOW (ref 15–41)
Albumin: 3.1 g/dL — ABNORMAL LOW (ref 3.5–5.0)
Alkaline Phosphatase: 95 U/L (ref 38–126)
Anion gap: 7 (ref 5–15)
BUN: 13 mg/dL (ref 8–23)
CO2: 35 mmol/L — ABNORMAL HIGH (ref 22–32)
Calcium: 8.5 mg/dL — ABNORMAL LOW (ref 8.9–10.3)
Chloride: 98 mmol/L (ref 98–111)
Creatinine, Ser: 0.81 mg/dL (ref 0.61–1.24)
GFR, Estimated: >60 mL/min (ref >60)
Glucose, Bld: 114 mg/dL — ABNORMAL HIGH (ref 70–99)
Potassium: 4.2 mmol/L (ref 3.5–5.1)
Sodium: 140 mmol/L (ref 135–145)
Total Bilirubin: 1.2 mg/dL (ref 0.0–1.2)
Total Protein: 6.4 g/dL — ABNORMAL LOW (ref 6.5–8.1)

## 2024-02-24 LAB — PROTIME-INR
INR: 1.2 (ref 0.8–1.2)
Prothrombin Time: 15.8 s — ABNORMAL HIGH (ref 11.4–15.2)

## 2024-02-24 LAB — BLOOD GAS, VENOUS
Acid-Base Excess: 11.5 mmol/L — ABNORMAL HIGH (ref 0.0–2.0)
Bicarbonate: 41.2 mmol/L — ABNORMAL HIGH (ref 20.0–28.0)
Patient temperature: 37
pCO2, Ven: 80 mmHg (ref 44–60)
pH, Ven: 7.32 (ref 7.25–7.43)

## 2024-02-24 LAB — LIPID PANEL
Cholesterol: 111 mg/dL (ref 0–200)
HDL: 45 mg/dL (ref >40)
LDL Cholesterol: 53 mg/dL (ref 0–99)
Total CHOL/HDL Ratio: 2.5 ratio
Triglycerides: 67 mg/dL (ref <150)
VLDL: 13 mg/dL (ref 0–40)

## 2024-02-24 LAB — TROPONIN I (HIGH SENSITIVITY)
Troponin I (High Sensitivity): 12 ng/L (ref <18)
Troponin I (High Sensitivity): 12 ng/L (ref <18)

## 2024-02-24 LAB — BRAIN NATRIURETIC PEPTIDE: B Natriuretic Peptide: 101.8 pg/mL — ABNORMAL HIGH (ref 0.0–100.0)

## 2024-02-24 MED ORDER — POLYSACCHARIDE IRON COMPLEX 150 MG PO CAPS
150.0000 mg | ORAL_CAPSULE | Freq: Every day | ORAL | Status: DC
  Administered 2024-02-25 – 2024-02-29 (×5): 150 mg via ORAL
  Filled 2024-02-24 (×5): qty 1

## 2024-02-24 MED ORDER — SODIUM CHLORIDE 0.9 % IV SOLN: 250.0000 mL | INTRAVENOUS | Status: AC | PRN

## 2024-02-24 MED ORDER — IOHEXOL 350 MG/ML SOLN
75.0000 mL | Freq: Once | INTRAVENOUS | Status: AC | PRN
  Administered 2024-02-24: 75 mL via INTRAVENOUS

## 2024-02-24 MED ORDER — ACETAMINOPHEN 325 MG PO TABS
650.0000 mg | ORAL_TABLET | ORAL | Status: DC | PRN
  Administered 2024-02-26: 650 mg via ORAL
  Filled 2024-02-24: qty 2

## 2024-02-24 MED ORDER — VITAMIN B-12 1000 MCG PO TABS
1000.0000 ug | ORAL_TABLET | Freq: Every day | ORAL | Status: DC
Start: 2024-02-25 — End: 2024-02-29
  Administered 2024-02-25 – 2024-02-29 (×5): 1000 ug via ORAL
  Filled 2024-02-24: qty 1
  Filled 2024-02-24: qty 2
  Filled 2024-02-24 (×3): qty 1

## 2024-02-24 MED ORDER — METOPROLOL SUCCINATE ER 50 MG PO TB24
50.0000 mg | ORAL_TABLET | Freq: Every day | ORAL | Status: DC
  Administered 2024-02-25 – 2024-02-29 (×5): 50 mg via ORAL
  Filled 2024-02-24 (×5): qty 1

## 2024-02-24 MED ORDER — ALPRAZOLAM 0.25 MG PO TABS
0.2500 mg | ORAL_TABLET | Freq: Every day | ORAL | Status: DC | PRN
  Administered 2024-02-27 (×2): 0.25 mg via ORAL
  Filled 2024-02-24 (×2): qty 1

## 2024-02-24 MED ORDER — FUROSEMIDE 10 MG/ML IJ SOLN
40.0000 mg | Freq: Once | INTRAMUSCULAR | Status: AC
  Administered 2024-02-24: 40 mg via INTRAVENOUS
  Filled 2024-02-24: qty 4

## 2024-02-24 MED ORDER — SODIUM CHLORIDE 0.9% FLUSH
3.0000 mL | Freq: Two times a day (BID) | INTRAVENOUS | Status: DC
  Administered 2024-02-25 – 2024-02-29 (×10): 3 mL via INTRAVENOUS

## 2024-02-24 MED ORDER — SODIUM CHLORIDE 0.9% FLUSH: 3.0000 mL | INTRAVENOUS | Status: DC | PRN

## 2024-02-24 MED ORDER — IPRATROPIUM-ALBUTEROL 0.5-2.5 (3) MG/3ML IN SOLN
3.0000 mL | Freq: Four times a day (QID) | RESPIRATORY_TRACT | Status: DC | PRN
  Administered 2024-02-25 (×2): 3 mL via RESPIRATORY_TRACT
  Filled 2024-02-24 (×2): qty 3

## 2024-02-24 MED ORDER — LORATADINE 10 MG PO TABS
10.0000 mg | ORAL_TABLET | Freq: Every day | ORAL | Status: DC
  Administered 2024-02-25 – 2024-02-29 (×5): 10 mg via ORAL
  Filled 2024-02-24 (×5): qty 1

## 2024-02-24 MED ORDER — PHENYLEPHRINE-MINERAL OIL-PET 0.25-14-74.9 % RE OINT
1.0000 | TOPICAL_OINTMENT | Freq: Two times a day (BID) | RECTAL | Status: DC | PRN
Start: 2024-02-24 — End: 2024-02-29

## 2024-02-24 MED ORDER — PANTOPRAZOLE SODIUM 20 MG PO TBEC
20.0000 mg | DELAYED_RELEASE_TABLET | Freq: Every day | ORAL | Status: DC
  Administered 2024-02-25 – 2024-02-29 (×5): 20 mg via ORAL
  Filled 2024-02-24 (×5): qty 1

## 2024-02-24 MED ORDER — SENNOSIDES-DOCUSATE SODIUM 8.6-50 MG PO TABS
1.0000 | ORAL_TABLET | Freq: Two times a day (BID) | ORAL | Status: DC
  Administered 2024-02-25 – 2024-02-29 (×8): 1 via ORAL
  Filled 2024-02-24 (×9): qty 1

## 2024-02-24 MED ORDER — ROSUVASTATIN CALCIUM 10 MG PO TABS
20.0000 mg | ORAL_TABLET | Freq: Every day | ORAL | Status: DC
Start: 2024-02-25 — End: 2024-02-29
  Administered 2024-02-25 – 2024-02-29 (×5): 20 mg via ORAL
  Filled 2024-02-24 (×4): qty 2
  Filled 2024-02-24: qty 1
  Filled 2024-02-24: qty 2

## 2024-02-24 MED ORDER — ACETAMINOPHEN 325 MG PO TABS
650.0000 mg | ORAL_TABLET | Freq: Once | ORAL | Status: AC
  Administered 2024-02-24: 650 mg via ORAL
  Filled 2024-02-24: qty 2

## 2024-02-24 MED ORDER — IPRATROPIUM-ALBUTEROL 0.5-2.5 (3) MG/3ML IN SOLN
3.0000 mL | Freq: Once | RESPIRATORY_TRACT | Status: AC
  Administered 2024-02-24: 3 mL via RESPIRATORY_TRACT
  Filled 2024-02-24: qty 3

## 2024-02-24 MED ORDER — METHYLPREDNISOLONE SODIUM SUCC 125 MG IJ SOLR
125.0000 mg | Freq: Once | INTRAMUSCULAR | Status: AC
  Administered 2024-02-24: 125 mg via INTRAVENOUS
  Filled 2024-02-24: qty 2

## 2024-02-24 MED ORDER — HYDROXYZINE HCL 25 MG PO TABS
25.0000 mg | ORAL_TABLET | Freq: Three times a day (TID) | ORAL | Status: DC | PRN
  Filled 2024-02-24: qty 1

## 2024-02-24 MED ORDER — ONDANSETRON HCL 4 MG/2ML IJ SOLN: 4.0000 mg | Freq: Four times a day (QID) | INTRAMUSCULAR | Status: DC | PRN

## 2024-02-24 MED ORDER — APIXABAN 5 MG PO TABS
5.0000 mg | ORAL_TABLET | Freq: Two times a day (BID) | ORAL | Status: DC
  Administered 2024-02-25 – 2024-02-29 (×9): 5 mg via ORAL
  Filled 2024-02-24 (×9): qty 1

## 2024-02-24 MED ORDER — FAMOTIDINE 20 MG PO TABS
40.0000 mg | ORAL_TABLET | Freq: Every day | ORAL | Status: DC
Start: 2024-02-25 — End: 2024-02-29
  Administered 2024-02-25 – 2024-02-29 (×5): 40 mg via ORAL
  Filled 2024-02-24 (×5): qty 2

## 2024-02-24 MED ORDER — HYDROCODONE-ACETAMINOPHEN 5-325 MG PO TABS
1.0000 | ORAL_TABLET | ORAL | Status: DC | PRN
  Administered 2024-02-27: 1 via ORAL
  Filled 2024-02-24: qty 1

## 2024-02-24 MED ORDER — FUROSEMIDE 10 MG/ML IJ SOLN
40.0000 mg | Freq: Two times a day (BID) | INTRAMUSCULAR | Status: DC
Start: 2024-02-25 — End: 2024-02-29
  Administered 2024-02-25 – 2024-02-28 (×9): 40 mg via INTRAVENOUS
  Filled 2024-02-24 (×9): qty 4

## 2024-02-24 NOTE — ED Notes (Signed)
 ED TO INPATIENT HANDOFF REPORT  ED Nurse Name and Phone #: [phone]  S Name/Age/Gender Thomas Tanner 73 y.o. male Room/Bed: ED32HA/ED32HA  Code Status   Code Status: Full Code  Home/SNF/Other Home Patient oriented to: self, place, time, and situation Is this baseline? Yes   Triage Complete: Triage complete  Chief Complaint Acute hypoxemic respiratory failure (HCC) [J96.01]  Triage Note Pt to ED via [facility] from home. EMS reports that pt started feeling the sensation of facial droop but never visualized any facial droop. Per EMS, pt reported that the the sensation stopped before EMS arrived. Pt has new onset of leg swelling and reports cough x1 week. Pt is wheelchair bound. Pt oriented x4. Pt reports wearing 2.5 L O2 at home chronically.   EMS vitals HR 70 BP 140/70 CBG 134    Allergies Allergies  Allergen Reactions   Morphine Anxiety and Other (See Comments)    Hallucinations  Pt states 6.17.22 that he is not allergic to this medication   Azithromycin Itching    Localized pruritus on arm with IV   Morphine And Codeine Anxiety    Level of Care/Admitting Diagnosis ED Disposition     ED Disposition  Admit   Condition  --   Comment  Hospital Area: [facility] [[id number]]  Level of Care: Telemetry Medical [[id number]]  Covid Evaluation: Asymptomatic - no recent exposure (last 10 days) testing not required  Diagnosis: Acute hypoxemic respiratory failure [facility]) [[id number]]  Admitting Physician: Erick Blinks [[id number]]  Attending Physician: Erick Blinks [[id number]]          B Medical/Surgery History Past Medical History:  Diagnosis Date   Arthritis    CHF (congestive heart failure) (HCC)    COPD (chronic obstructive pulmonary disease) (HCC)    Diabetes (HCC)    Hyperlipidemia    Hypertension    Kidney stone    Obesity    Tachycardia    TIA (transient ischemic attack)    Past Surgical History:  Procedure Laterality Date    CORONARY/GRAFT ACUTE MI REVASCULARIZATION N/A 12/08/2018   Procedure: Coronary/Graft Acute MI Revascularization;  Surgeon: Alwyn Pea, MD;  Location: [facility] INVASIVE CV LAB;  Service: Cardiovascular;  Laterality: N/A;   INCISION AND DRAINAGE ABSCESS N/A 05/18/2021   Procedure: INCISION AND DRAINAGE SCROTAL ABSCESS, cystoscopy with urethral dilation, and complex catheter placement;  Surgeon: Sondra Come, MD;  Location: [facility] ORS;  Service: Urology;  Laterality: N/A;   IRRIGATION AND DEBRIDEMENT ABSCESS N/A 05/20/2021   Procedure: IRRIGATION AND DEBRIDEMENT ABSCESS;  Surgeon: Riki Altes, MD;  Location: [facility] ORS;  Service: Urology;  Laterality: N/A;   IRRIGATION AND DEBRIDEMENT ABSCESS N/A 05/21/2021   Procedure: IRRIGATION AND DEBRIDEMENT ABSCESS;  Surgeon: Riki Altes, MD;  Location: [facility] ORS;  Service: Urology;  Laterality: N/A;   KIDNEY STONE SURGERY     left arm surgery  1963   LEFT HEART CATH AND CORONARY ANGIOGRAPHY N/A 12/08/2018   Procedure: LEFT HEART CATH AND CORONARY ANGIOGRAPHY;  Surgeon: Alwyn Pea, MD;  Location: [facility] CV LAB;  Service: Cardiovascular;  Laterality: N/A;   PULMONARY THROMBECTOMY N/A 08/03/2020   Procedure: PULMONARY THROMBECTOMY / THROMBOLYSIS;  Surgeon: Annice Needy, MD;  Location: [facility];  Service: Cardiovascular;  Laterality: N/A;   PULMONARY THROMBECTOMY Bilateral 01/08/2023   Procedure: PULMONARY THROMBECTOMY;  Surgeon: Renford Dills, MD;  Location: [facility];  Service: Cardiovascular;  Laterality: Bilateral;   RECTAL EXAM  UNDER ANESTHESIA N/A 05/20/2021   Procedure: EXAM UNDER ANESTHESIA-Dressing and Change;  Surgeon: Riki Altes, MD;  Location: [facility] ORS;  Service: Urology;  Laterality: N/A;     A IV Location/Drains/Wounds Patient Lines/Drains/Airways Status     Active Line/Drains/Airways     Name Placement date Placement time Site Days   Peripheral IV 02/24/24 20 G Posterior;Right Forearm  02/24/24  1344  Forearm  less than 1   External Urinary Catheter --  --  --  --   External Urinary Catheter 01/08/23  1840  --  412            Intake/Output Last 24 hours  Intake/Output Summary (Last 24 hours) at 02/24/2024 2027 Last data filed at 02/24/2024 2000 Gross per 24 hour  Intake --  Output 700 ml  Net -700 ml    Labs/Imaging Results for orders placed or performed during the hospital encounter of 02/24/24 (from the past 48 hours)  Brain natriuretic peptide     Status: Abnormal   Collection Time: 02/24/24 11:34 AM  Result Value Ref Range   B Natriuretic Peptide 101.8 (H) 0.0 - 100.0 pg/mL    Comment: Performed at [facility], [address] [place], [place] [address]  Comprehensive metabolic panel     Status: Abnormal   Collection Time: 02/24/24 11:34 AM  Result Value Ref Range   Sodium 140 135 - 145 mmol/L   Potassium 4.2 3.5 - 5.1 mmol/L   Chloride 98 98 - 111 mmol/L   CO2 35 (H) 22 - 32 mmol/L   Glucose, Bld 114 (H) 70 - 99 mg/dL    Comment: Glucose reference range applies only to samples taken after fasting for at least 8 hours.   BUN 13 8 - 23 mg/dL   Creatinine, Ser 2.95 0.61 - 1.24 mg/dL   Calcium 8.5 (L) 8.9 - 10.3 mg/dL   Total Protein 6.4 (L) 6.5 - 8.1 g/dL   Albumin 3.1 (L) 3.5 - 5.0 g/dL   AST 14 (L) 15 - 41 U/L   ALT 7 0 - 44 U/L   Alkaline Phosphatase 95 38 - 126 U/L   Total Bilirubin 1.2 0.0 - 1.2 mg/dL   GFR, Estimated >62 >13 mL/min    Comment: (NOTE) Calculated using the CKD-EPI Creatinine Equation (2021)    Anion gap 7 5 - 15    Comment: Performed at [facility], [address] [place], [place] [address]  Troponin I (High Sensitivity)     Status: None   Collection Time: 02/24/24 11:34 AM  Result Value Ref Range   Troponin I (High Sensitivity) 12 <18 ng/L    Comment: (NOTE) Elevated high sensitivity troponin I (hsTnI) values and significant  changes across serial measurements may suggest ACS but many other   chronic and acute conditions are known to elevate hsTnI results.  Refer to the "Links" section for chest pain algorithms and additional  guidance. Performed at [facility] [address] [place], [place] [address]   CBC with Differential     Status: Abnormal   Collection Time: 02/24/24 11:34 AM  Result Value Ref Range   WBC 6.7 4.0 - 10.5 K/uL   RBC 3.83 (L) 4.22 - 5.81 MIL/uL   Hemoglobin 8.9 (L) 13.0 - 17.0 g/dL   HCT 29.5 (L) 28.4 - 13.2 %   MCV 84.3 80.0 - 100.0 fL   MCH 23.2 (L) 26.0 - 34.0 pg   MCHC 27.6 (L) 30.0 - 36.0 g/dL  Comment: CORRECTED FOR COLD AGGLUTININS   RDW 18.3 (H) 11.5 - 15.5 %   Platelets 189 150 - 400 K/uL   nRBC 0.0 0.0 - 0.2 %   Neutrophils Relative % 80 %   Neutro Abs 5.3 1.7 - 7.7 K/uL   Lymphocytes Relative 11 %   Lymphs Abs 0.7 0.7 - 4.0 K/uL   Monocytes Relative 9 %   Monocytes Absolute 0.6 0.1 - 1.0 K/uL   Eosinophils Relative 0 %   Eosinophils Absolute 0.0 0.0 - 0.5 K/uL   Basophils Relative 0 %   Basophils Absolute 0.0 0.0 - 0.1 K/uL   Immature Granulocytes 0 %   Abs Immature Granulocytes 0.02 0.00 - 0.07 K/uL    Comment: Performed at [facility], [address] [place], [place] [address]  Protime-INR     Status: Abnormal   Collection Time: 02/24/24 11:34 AM  Result Value Ref Range   Prothrombin Time 15.8 (H) 11.4 - 15.2 seconds   INR 1.2 0.8 - 1.2    Comment: (NOTE) INR goal varies based on device and disease states. Performed at [facility], [address] [place], [place] [address]   D-dimer, quantitative     Status: Abnormal   Collection Time: 02/24/24 11:34 AM  Result Value Ref Range   D-Dimer, Quant 0.88 (H) 0.00 - 0.50 ug/mL-FEU    Comment: (NOTE) At the manufacturer cut-off value of 0.5 g/mL FEU, this assay has a negative predictive value of 95-100%.This assay is intended for use in conjunction with a clinical pretest probability (PTP) assessment model to exclude pulmonary embolism  (PE) and deep venous thrombosis (DVT) in outpatients suspected of PE or DVT. Results should be correlated with clinical presentation. Performed at [facility], [address] [place], [place] [address]   Blood gas, venous     Status: Abnormal   Collection Time: 02/24/24 11:35 AM  Result Value Ref Range   pH, Ven 7.32 7.25 - 7.43   pCO2, Ven 80 (HH) 44 - 60 mmHg    Comment: CRITICAL RESULT CALLED TO, READ BACK BY AND VERIFIED WITH: CRITICAL VALUE 02/24/24,1205,DR.TING XU TAN/FD    Bicarbonate 41.2 (H) 20.0 - 28.0 mmol/L   Acid-Base Excess 11.5 (H) 0.0 - 2.0 mmol/L   Patient temperature 37.0    Collection site VEIN     Comment: Performed at [facility], [address] [place], [place] [address]  Resp panel by RT-PCR (RSV, Flu A&B, Covid) Anterior Nasal Swab     Status: None   Collection Time: 02/24/24 11:35 AM   Specimen: Anterior Nasal Swab  Result Value Ref Range   SARS Coronavirus 2 by RT PCR NEGATIVE NEGATIVE    Comment: (NOTE) SARS-CoV-2 target nucleic acids are NOT DETECTED.  The SARS-CoV-2 RNA is generally detectable in upper respiratory specimens during the acute phase of infection. The lowest concentration of SARS-CoV-2 viral copies this assay can detect is 138 copies/mL. A negative result does not preclude SARS-Cov-2 infection and should not be used as the sole basis for treatment or other patient management decisions. A negative result may occur with  improper specimen collection/handling, submission of specimen other than nasopharyngeal swab, presence of viral mutation(s) within the areas targeted by this assay, and inadequate number of viral copies(<138 copies/mL). A negative result must be combined with clinical observations, patient history, and epidemiological information. The expected result is Negative.  Fact Sheet for Patients:  BloggerCourse.com  Fact Sheet for Healthcare Providers:   SeriousBroker.it  This test is no t yet approved or cleared by the [place] and  has been authorized for detection and/or diagnosis of SARS-CoV-2 by FDA under an Emergency Use Authorization (EUA). This EUA will remain  in effect (meaning this test can be used) for the duration of the COVID-19 declaration under Section 564(b)(1) of the Act, 21 U.S.C.section 360bbb-3(b)(1), unless the authorization is terminated  or revoked sooner.       Influenza A by PCR NEGATIVE NEGATIVE   Influenza B by PCR NEGATIVE NEGATIVE    Comment: (NOTE) The Xpert Xpress SARS-CoV-2/FLU/RSV plus assay is intended as an aid in the diagnosis of influenza from Nasopharyngeal swab specimens and should not be used as a sole basis for treatment. Nasal washings and aspirates are unacceptable for Xpert Xpress SARS-CoV-2/FLU/RSV testing.  Fact Sheet for Patients: BloggerCourse.com  Fact Sheet for Healthcare Providers: SeriousBroker.it  This test is not yet approved or cleared by the [place] FDA and has been authorized for detection and/or diagnosis of SARS-CoV-2 by FDA under an Emergency Use Authorization (EUA). This EUA will remain in effect (meaning this test can be used) for the duration of the COVID-19 declaration under Section 564(b)(1) of the Act, 21 U.S.C. section 360bbb-3(b)(1), unless the authorization is terminated or revoked.     Resp Syncytial Virus by PCR NEGATIVE NEGATIVE    Comment: (NOTE) Fact Sheet for Patients: BloggerCourse.com  Fact Sheet for Healthcare Providers: SeriousBroker.it  This test is not yet approved or cleared by the [place] FDA and has been authorized for detection and/or diagnosis of SARS-CoV-2 by FDA under an Emergency Use Authorization (EUA). This EUA will remain in effect (meaning this test can be used) for the duration of  the COVID-19 declaration under Section 564(b)(1) of the Act, 21 U.S.C. section 360bbb-3(b)(1), unless the authorization is terminated or revoked.  Performed at [facility], [address] [place], [place] [address]   Troponin I (High Sensitivity)     Status: None   Collection Time: 02/24/24  1:02 PM  Result Value Ref Range   Troponin I (High Sensitivity) 12 <18 ng/L    Comment: (NOTE) Elevated high sensitivity troponin I (hsTnI) values and significant  changes across serial measurements may suggest ACS but many other  chronic and acute conditions are known to elevate hsTnI results.  Refer to the "Links" section for chest pain algorithms and additional  guidance. Performed at [facility], [address] [place], [place] [address]    CT Angio Chest PE W/Cm &/Or Wo Cm Result Date: 02/24/2024 CLINICAL DATA:  Positive D-dimer, cough, leg swelling EXAM: CT ANGIOGRAPHY CHEST WITH CONTRAST TECHNIQUE: Multidetector CT imaging of the chest was performed using the standard protocol during bolus administration of intravenous contrast. Multiplanar CT image reconstructions and MIPs were obtained to evaluate the vascular anatomy. RADIATION DOSE REDUCTION: This exam was performed according to the departmental dose-optimization program which includes automated exposure control, adjustment of the mA and/or kV according to patient size and/or use of iterative reconstruction technique. CONTRAST:  75mL OMNIPAQUE IOHEXOL 350 MG/ML SOLN COMPARISON:  01/08/2023, 02/24/2024 FINDINGS: Cardiovascular: This is a technically suboptimal evaluation of the pulmonary vasculature, due to timing of the contrast bolus and respiratory motion during the study. I do not see any large filling defect within the main pulmonary artery, though the segmental and subsegmental branches cannot be fully evaluated due to incomplete opacification. Mild cardiomegaly without pericardial effusion. No evidence of thoracic  aortic aneurysm or dissection. Atherosclerosis of the  aorta and coronary vasculature. Mediastinum/Nodes: Stable appearance of the thyroid, trachea, and esophagus. No pathologic adenopathy. Lungs/Pleura: There are small bilateral pleural effusions. There is dependent atelectasis within the right lower lobe. There is dense consolidation and volume loss within the right middle lobe and left lower lobe, consistent with atelectasis. No acute airspace disease. No pneumothorax. Central airways are patent. Upper Abdomen: No acute abnormality. Musculoskeletal: There are no acute displaced fractures. 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 Reconstructed images demonstrate no additional findings. Review of the MIP images confirms the above findings. IMPRESSION: 1. Limited evaluation of the pulmonary vasculature due to timing of contrast bolus and respiratory motion. I do not see any filling defect within the main pulmonary artery. The lobar, segmental, and subsegmental branches cannot be fully evaluated due to incomplete opacification. If pulmonary embolus remains a concern, repeat CT pulmonary angiography could be performed. 2. Small bilateral pleural effusions. 3. Atelectasis within the right middle, right lower, and left lower lobes as above. No acute airspace disease. 4. Cardiomegaly. 5.  Choose 1 coronary artery atherosclerosis. Electronically Signed   By: Sharlet Salina M.D.   On: 02/24/2024 17:11   DG Chest 2 View Result Date: 02/24/2024 CLINICAL DATA:  Cough. EXAM: CHEST - 2 VIEW COMPARISON:  01/08/2023. FINDINGS: There is homogeneous opacity at the right lung base, which is new since the prior study and is concerning for right lung lower lobe partial collapse. Bilateral lung fields are otherwise clear. There is blunting of bilateral posterior costophrenic angles, suggesting bilateral trace pleural effusions. Stable cardio-mediastinal silhouette. No acute osseous  abnormalities. The soft tissues are within normal limits. IMPRESSION: There is homogeneous opacity at the right lung base, which is new since the prior study and is concerning for right lung lower lobe partial collapse. Bilateral trace pleural effusions. Electronically Signed   By: Jules Schick M.D.   On: 02/24/2024 12:29   CT Head Wo Contrast Result Date: 02/24/2024 CLINICAL DATA:  TIA, sensation of facial droop. EXAM: CT HEAD WITHOUT CONTRAST TECHNIQUE: Contiguous axial images were obtained from the base of the skull through the vertex without intravenous contrast. RADIATION DOSE REDUCTION: This exam was performed according to the departmental dose-optimization program which includes automated exposure control, adjustment of the mA and/or kV according to patient size and/or use of iterative reconstruction technique. COMPARISON:  MRI head 06/21/2013 FINDINGS: Brain: No acute intracranial hemorrhage. No CT evidence of acute infarct. Nonspecific hypoattenuation in the periventricular and subcortical white matter favored to reflect chronic microvascular ischemic changes. Focal hypoattenuation in the inferior aspect of the right basal ganglia likely reflecting a prominent perivascular space. No edema, mass effect, or midline shift. The basilar cisterns are patent. Ventricles: The ventricles are normal. Vascular: Atherosclerotic calcifications of the carotid siphons. No hyperdense vessel. Skull: No acute or aggressive finding. Orbits: Orbits are symmetric. Sinuses: The visualized paranasal sinuses are clear. Other: Mastoid air cells are clear. IMPRESSION: No CT evidence of acute intracranial abnormality. Electronically Signed   By: Emily Filbert M.D.   On: 02/24/2024 11:59    Pending Labs Unresulted Labs (From admission, onward)     Start     Ordered   02/25/24 0500  Basic metabolic panel  Daily,   R     Comments: As Scheduled for 5 days    02/24/24 1927   02/25/24 0500  Magnesium  Tomorrow morning,   R         02/24/24 1927   02/24/24 1928  Lipid panel  Once,   R        02/24/24 1927            Vitals/Pain Today's Vitals   02/24/24 1508 02/24/24 1615 02/24/24 1928 02/24/24 2022  BP:  122/85  129/68  Pulse:  94  86  Resp:  16  (!) 29  Temp:   97.6 F (36.4 C) 97.6 F (36.4 C)  TempSrc:   Oral Oral  SpO2:  94%  93%  Weight:      Height:      PainSc: 6    0-No pain    Isolation Precautions No active isolations  Medications Medications  metoprolol succinate (TOPROL-XL) 24 hr tablet 50 mg (has no administration in time range)  rosuvastatin (CRESTOR) tablet 20 mg (has no administration in time range)  HYDROcodone-acetaminophen (NORCO/VICODIN) 5-325 MG per tablet 1 tablet (has no administration in time range)  ALPRAZolam (XANAX) tablet 0.25 mg (has no administration in time range)  hydrOXYzine (ATARAX) tablet 25 mg (has no administration in time range)  famotidine (PEPCID) tablet 40 mg (has no administration in time range)  pantoprazole (PROTONIX) EC tablet 20 mg (has no administration in time range)  senna-docusate (Senokot-S) tablet 1 tablet (has no administration in time range)  apixaban (ELIQUIS) tablet 5 mg (has no administration in time range)  iron polysaccharides (NIFEREX) capsule 150 mg (has no administration in time range)  cyanocobalamin (VITAMIN B12) tablet 1,000 mcg (has no administration in time range)  loratadine (CLARITIN) tablet 10 mg (has no administration in time range)  ipratropium-albuterol (DUONEB) 0.5-2.5 (3) MG/3ML nebulizer solution 3 mL (has no administration in time range)  phenylephrine-shark liver oil-mineral oil-petrolatum (PREPARATION H) rectal ointment 1 Application (has no administration in time range)  sodium chloride flush (NS) 0.9 % injection 3 mL (has no administration in time range)  sodium chloride flush (NS) 0.9 % injection 3 mL (has no administration in time range)  0.9 %  sodium chloride infusion (has no administration in time range)   acetaminophen (TYLENOL) tablet 650 mg (has no administration in time range)  ondansetron (ZOFRAN) injection 4 mg (has no administration in time range)  furosemide (LASIX) injection 40 mg (has no administration in time range)  ipratropium-albuterol (DUONEB) 0.5-2.5 (3) MG/3ML nebulizer solution 3 mL (3 mLs Nebulization Given 02/24/24 1152)  ipratropium-albuterol (DUONEB) 0.5-2.5 (3) MG/3ML nebulizer solution 3 mL (3 mLs Nebulization Given 02/24/24 1402)  iohexol (OMNIPAQUE) 350 MG/ML injection 75 mL (75 mLs Intravenous Contrast Given 02/24/24 1549)  acetaminophen (TYLENOL) tablet 650 mg (650 mg Oral Given 02/24/24 1508)  methylPREDNISolone sodium succinate (SOLU-MEDROL) 125 mg/2 mL injection 125 mg (125 mg Intravenous Given 02/24/24 1834)  furosemide (LASIX) injection 40 mg (40 mg Intravenous Given 02/24/24 1835)    Mobility manual wheelchair     Focused Assessments Pulmonary Assessment Handoff:  Lung sounds:   O2 Device: Nasal Cannula 4L    R Recommendations: See Admitting Provider Note  Report given to:   Additional Notes:

## 2024-02-24 NOTE — ED Provider Notes (Signed)
 [facility] Provider Note    Event Date/Time   First MD Initiated Contact with Patient 02/24/24 1051     (approximate)   History   Facial Droop   HPI  Thomas Tanner is a 73 y.o. male with history of CHF, COPD 4 L nasal cannula at baseline, diabetes, hypertension, hyperlipidemia, prior TIA presenting with transient facial tightness.  States that he felt like his lower face was tight, worse on the right.  Did not actually look in the mirror to see if he had a facial droop or not.  Symptoms resolved prior to EMS arrival.  He told EMS that he thought he had a facial droop.  When they got there, they did not know this any facial droop or any neurodeficits.  States that he also had some shortness of breath and cough for a week.  Had completed a Z-Pak for upper respiratory infection.  No fevers, no chest pain.  No focal weakness or numbness to his extremities.  No vision changes.  States that he also notices bilateral lower extremities were swollen for the past week.  On independent review he was seen by his primary care provider in December 16, 2023, he is on Eliquis, and Lasix.  She of diastolic heart failure.  He is wheelchair-bound.       Physical Exam   Triage Vital Signs: ED Triage Vitals  Encounter Vitals Group     BP      Systolic BP Percentile      Diastolic BP Percentile      Pulse      Resp      Temp      Temp src      SpO2      Weight      Height      Head Circumference      Peak Flow      Pain Score      Pain Loc      Pain Education      Exclude from Growth Chart     Most recent vital signs: Vitals:   02/24/24 1221 02/24/24 1230  BP:  139/71  Pulse: 95 88  Resp:  18  SpO2: 95% 92%     General: Awake, no distress.  CV:  Good peripheral perfusion.  Resp:  Normal effort.  Diminished Abd:  No distention.  Nontender Other:  Bilateral lower extremity edema that appears symmetrical, he has some healing scabs to his bilateral  calves, no induration or fluctuance, not warm to touch, no purulent drainage.  Pupils are equal and reactive, extraocular movements are intact, no cranial nerve deficits, no facial droop, no focal weakness or numbness.  No facial swelling or asymmetry.   ED Results / Procedures / Treatments   Labs (all labs ordered are listed, but only abnormal results are displayed) Labs Reviewed  BRAIN NATRIURETIC PEPTIDE - Abnormal; Notable for the following components:      Result Value   B Natriuretic Peptide 101.8 (*)    All other components within normal limits  COMPREHENSIVE METABOLIC PANEL - Abnormal; Notable for the following components:   CO2 35 (*)    Glucose, Bld 114 (*)    Calcium 8.5 (*)    Total Protein 6.4 (*)    Albumin 3.1 (*)    AST 14 (*)    All other components within normal limits  CBC WITH DIFFERENTIAL/PLATELET - Abnormal; Notable for the following components:   RBC 3.83 (*)  Hemoglobin 8.9 (*)    HCT 32.3 (*)    MCH 23.2 (*)    MCHC 27.6 (*)    RDW 18.3 (*)    All other components within normal limits  PROTIME-INR - Abnormal; Notable for the following components:   Prothrombin Time 15.8 (*)    All other components within normal limits  BLOOD GAS, VENOUS - Abnormal; Notable for the following components:   pCO2, Ven 80 (*)    Bicarbonate 41.2 (*)    Acid-Base Excess 11.5 (*)    All other components within normal limits  D-DIMER, QUANTITATIVE - Abnormal; Notable for the following components:   D-Dimer, Quant 0.88 (*)    All other components within normal limits  RESP PANEL BY RT-PCR (RSV, FLU A&B, COVID)  RVPGX2  TROPONIN I (HIGH SENSITIVITY)  TROPONIN I (HIGH SENSITIVITY)     EKG  EKG shows sinus rhythm, right bundle branch block, widened QRS, normal QTc, no obvious ischemic ST elevation, there are PVCs, baseline is wandering but T wave flattening in 3, not significant change compared to prior   RADIOLOGY CT head and my interpretation without obvious  intracranial hemorrhage   PROCEDURES:  Critical Care performed: No  Procedures   MEDICATIONS ORDERED IN ED: Medications  iohexol (OMNIPAQUE) 350 MG/ML injection 75 mL (has no administration in time range)  ipratropium-albuterol (DUONEB) 0.5-2.5 (3) MG/3ML nebulizer solution 3 mL (3 mLs Nebulization Given 02/24/24 1152)  ipratropium-albuterol (DUONEB) 0.5-2.5 (3) MG/3ML nebulizer solution 3 mL (3 mLs Nebulization Given 02/24/24 1402)  acetaminophen (TYLENOL) tablet 650 mg (650 mg Oral Given 02/24/24 1508)     IMPRESSION / MDM / ASSESSMENT AND PLAN / ED COURSE  I reviewed the triage vital signs and the nursing notes.                              Differential diagnosis includes, but is not limited to, TIA, no focal deficits at this time to suggest CVA, for the facial tightness, considered angioedema, anaphylaxis, but he does not have any facial swelling at this time, no hives, no symptoms of throat tightness.  Electrolyte derangements, pneumonia, viral illness, ACS, CHF, considered COPD the patient is not tachypneic at this time, his results are diminished, will give him a DuoNeb to see if this improves his shortness of breath.  Considered PE but he is not acutely hypoxic, not tachypneic, no unilateral calf swelling, he is however wheelchair-bound.  Will get a D-dimer.  Labs, EKG, troponin, chest x-ray, CT head.  Patient's presentation is most consistent with acute presentation with potential threat to life or bodily function.  Independent review of labs and imaging are below.  Patient signed out to oncoming team pending CT PE study.  Likely able to be discharged after.  Clinical Course as of 02/24/24 1515  Mon Feb 24, 2024  1207 CT Head Wo Contrast No CT evidence of acute intracranial abnormality.  [TT]  1328 DG Chest 2 View IMPRESSION: There is homogeneous opacity at the right lung base, which is new since the prior study and is concerning for right lung lower lobe partial  collapse. Bilateral trace pleural effusions.   [TT]  1456 Independent review of labs, troponin is not elevated, electrolytes not severely deranged, creatinine is normal, BNP is mildly elevated but this is lower compared to prior, VBG shows that he is a chronic retainer, D-dimer is elevated, no leukocytosis. [TT]    Clinical Course User Index [  TT] Claybon Jabs, MD     FINAL CLINICAL IMPRESSION(S) / ED DIAGNOSES   Final diagnoses:  Shortness of breath  Cough, unspecified type  Leg swelling  Facial discomfort     Rx / DC Orders   ED Discharge Orders     None        Note:  This document was prepared using Dragon voice recognition software and may include unintentional dictation errors.    Claybon Jabs, MD 02/24/24 [phone]

## 2024-02-24 NOTE — H&P (Addendum)
 History and Physical    [name] NUU:[id number] DOB: [birth year] DOA: 02/24/2024  PCP: Sallyanne Kuster, NP   Patient coming from: Home  Chief Complaint: Facial droop/dyspnea  HPI: Thomas Tanner is a 73 y.o. male with medical history significant for diastolic CHF with EF 55-60%, COPD with chronic hypoxemia 4 L nasal cannula, prior history saddle PE on anticoagulation, type 2 diabetes, hypertension, dyslipidemia, prior TIA, and morbid obesity who presented to the ED with initial concerns of lower face tightness worse on the right as well as questionable droop, however he states that this was bilateral.  EMS was called for evaluation of potential stroke, however day did not notice any facial asymmetry or neurodeficits.  He has had some shortness of breath and cough for approximately 1 week and had recently completed a Z-Pak for upper respiratory infection.  He denies any fevers or chills or chest pain, but does complain of some bilateral lower extremity swelling over the past 1 week.  He states that he is compliant with his home Lasix dose, however he does eat out frequently and snacks on potato chips and drinks soda.   ED Course: Vital signs stable and patient afebrile.  Appears to be on baseline 4 L nasal cannula.  Hemoglobin 8.9, BNP 101.8.  CT head with no acute findings.  CT PE study with no findings of PE, but not an ideal study.  Bilateral pleural effusions noted as well as some cardiomegaly.  Patient given a dose of Lasix as well as breathing treatments and IV steroids.  Review of Systems: Reviewed as noted above, otherwise negative.  Past Medical History:  Diagnosis Date   Arthritis    CHF (congestive heart failure) (HCC)    COPD (chronic obstructive pulmonary disease) (HCC)    Diabetes (HCC)    Hyperlipidemia    Hypertension    Kidney stone    Obesity    Tachycardia    TIA (transient ischemic attack)     Past Surgical History:  Procedure Laterality  Date   CORONARY/GRAFT ACUTE MI REVASCULARIZATION N/A 12/08/2018   Procedure: Coronary/Graft Acute MI Revascularization;  Surgeon: Alwyn Pea, MD;  Location: [facility];  Service: Cardiovascular;  Laterality: N/A;   INCISION AND DRAINAGE ABSCESS N/A 05/18/2021   Procedure: INCISION AND DRAINAGE SCROTAL ABSCESS, cystoscopy with urethral dilation, and complex catheter placement;  Surgeon: Sondra Come, MD;  Location: [facility];  Service: Urology;  Laterality: N/A;   IRRIGATION AND DEBRIDEMENT ABSCESS N/A 05/20/2021   Procedure: IRRIGATION AND DEBRIDEMENT ABSCESS;  Surgeon: Riki Altes, MD;  Location: [facility];  Service: Urology;  Laterality: N/A;   IRRIGATION AND DEBRIDEMENT ABSCESS N/A 05/21/2021   Procedure: IRRIGATION AND DEBRIDEMENT ABSCESS;  Surgeon: Riki Altes, MD;  Location: [facility];  Service: Urology;  Laterality: N/A;   KIDNEY STONE SURGERY     left arm surgery  1963   LEFT HEART CATH AND CORONARY ANGIOGRAPHY N/A 12/08/2018   Procedure: LEFT HEART CATH AND CORONARY ANGIOGRAPHY;  Surgeon: Alwyn Pea, MD;  Location: [facility];  Service: Cardiovascular;  Laterality: N/A;   PULMONARY THROMBECTOMY N/A 08/03/2020   Procedure: PULMONARY THROMBECTOMY / THROMBOLYSIS;  Surgeon: Annice Needy, MD;  Location: [facility];  Service: Cardiovascular;  Laterality: N/A;   PULMONARY THROMBECTOMY Bilateral 01/08/2023   Procedure: PULMONARY THROMBECTOMY;  Surgeon: Renford Dills, MD;  Location: [facility];  Service: Cardiovascular;  Laterality: Bilateral;   RECTAL EXAM UNDER ANESTHESIA  N/A 05/20/2021   Procedure: EXAM UNDER ANESTHESIA-Dressing and Change;  Surgeon: Riki Altes, MD;  Location: [facility];  Service: Urology;  Laterality: N/A;     reports that he has been smoking cigarettes. He has been exposed to tobacco smoke. He has never used smokeless tobacco. He reports that he does not drink alcohol and does not use drugs.  Allergies   Allergen Reactions   Morphine Anxiety and Other (See Comments)    Hallucinations  Pt states 6.17.22 that he is not allergic to this medication   Azithromycin Itching    Localized pruritus on arm with IV   Morphine And Codeine Anxiety    Family History  Problem Relation Age of Onset   Alzheimer's disease Mother    CAD Father     Prior to Admission medications   Medication Sig Start Date End Date Taking? Authorizing Provider  albuterol (VENTOLIN HFA) 108 (90 Base) MCG/ACT inhaler Inhale 2 puffs into the lungs every 6 (six) hours as needed for wheezing or shortness of breath. 11/18/23   Sallyanne Kuster, NP  ALPRAZolam (XANAX) 0.25 MG tablet Take 1 tablet (0.25 mg total) by mouth daily as needed (severe anxiety). May take 1 additional dose after 2 hours if the first dose is not effective. 10/10/23   Sallyanne Kuster, NP  cetirizine (ZYRTEC) 10 MG tablet Take 1 tablet by mouth once daily 12/13/23   Sallyanne Kuster, NP  ELIQUIS 5 MG TABS tablet Take 1 tablet by mouth twice daily 11/11/23   Lyndon Code, MD  famotidine (PEPCID) 40 MG tablet Take 1 tablet (40 mg total) by mouth daily. 12/16/23   Sallyanne Kuster, NP  furosemide (LASIX) 40 MG tablet Take 1 tablet (40 mg total) by mouth daily. 12/21/22 12/21/23  Susa Griffins, MD  HYDROcodone-acetaminophen (NORCO/VICODIN) 5-325 MG tablet Take 1 tablet by mouth every 4 (four) hours as needed for severe pain (pain score 7-10). 10/10/23   Sallyanne Kuster, NP  hydrOXYzine (ATARAX) 25 MG tablet TAKE 1 TABLET BY MOUTH THREE TIMES DAILY AS NEEDED FOR ITCHING 10/10/23   Abernathy, Alyssa, NP  ipratropium-albuterol (DUONEB) 0.5-2.5 (3) MG/3ML SOLN Take 3 mLs by nebulization every 6 (six) hours as needed. 01/07/23   Sallyanne Kuster, NP  iron polysaccharides (NIFEREX) 150 MG capsule Take 1 capsule (150 mg total) by mouth daily. 05/27/21   Marrion Coy, MD  lansoprazole (PREVACID) 30 MG capsule TAKE 1 CAPSULE BY MOUTH ONCE DAILY BEFORE BREAKFAST  10/10/23   Sallyanne Kuster, NP  metoprolol succinate (TOPROL-XL) 50 MG 24 hr tablet Take with or immediately following a meal. 10/10/23   Abernathy, Alyssa, NP  OXYGEN Inhale 2.5 L into the lungs. 24 HRS CONTINUOUS OXYGEN USES [facility]    [provider]  phenylephrine-shark liver oil-mineral oil-petrolatum (PREPARATION H) 0.25-14-74.9 % rectal ointment Place 1 application rectally 2 (two) times daily as needed for hemorrhoids. 05/16/21   Tresa Moore, MD  pneumococcal 20-valent conjugate vaccine (PREVNAR 20) 0.5 ML injection Inject 0.5 mLs into the muscle once as needed for up to 1 dose for immunization. 11/18/23   Sallyanne Kuster, NP  rosuvastatin (CRESTOR) 20 MG tablet Take 1 tablet (20 mg total) by mouth daily. 10/10/23   Sallyanne Kuster, NP  senna-docusate (SENOKOT-S) 8.6-50 MG tablet Take 1 tablet by mouth 2 (two) times daily. 05/16/21   Tresa Moore, MD  Tdap Leda Min) 5-2.5-18.5 LF-MCG/0.5 injection Inject 0.5 mLs into the muscle once as needed for up to 1 dose for immunization. 11/18/23  Sallyanne Kuster, NP  vitamin B-12 1000 MCG tablet Take 1 tablet (1,000 mcg total) by mouth daily. 05/27/21   Marrion Coy, MD  Zoster Vaccine Adjuvanted [facility]) injection Inject 0.5 mLs into the muscle once as needed for up to 1 dose. 11/18/23   Sallyanne Kuster, NP    Physical Exam: Vitals:   02/24/24 1221 02/24/24 1230 02/24/24 1430 02/24/24 1615  BP:  139/71 (!) 144/78 122/85  Pulse: 95 88 92 94  Resp:  18 18 16   SpO2: 95% 92% 99% 94%  Weight:      Height:        Constitutional: NAD, calm, comfortable, morbidly obese Vitals:   02/24/24 1221 02/24/24 1230 02/24/24 1430 02/24/24 1615  BP:  139/71 (!) 144/78 122/85  Pulse: 95 88 92 94  Resp:  18 18 16   SpO2: 95% 92% 99% 94%  Weight:      Height:       Eyes: lids and conjunctivae normal Neck: normal, supple Respiratory: clear to auscultation bilaterally. Normal respiratory effort. No accessory  muscle use.  On 4 L nasal cannula. Cardiovascular: Regular rate and rhythm, no murmurs. Abdomen: no tenderness, no distention. Bowel sounds positive.  Musculoskeletal: Chronic bilateral edema with venous stasis ulcers noted. Psychiatric: Flat affect  Labs on Admission: I have personally reviewed following labs and imaging studies  CBC: Recent Labs  Lab 02/24/24 1134  WBC 6.7  NEUTROABS 5.3  HGB 8.9*  HCT 32.3*  MCV 84.3  PLT 189   Basic Metabolic Panel: Recent Labs  Lab 02/24/24 1134  NA 140  K 4.2  CL 98  CO2 35*  GLUCOSE 114*  BUN 13  CREATININE 0.81  CALCIUM 8.5*   GFR: Estimated Creatinine Clearance: 145.6 mL/min (by C-G formula based on SCr of 0.81 mg/dL). Liver Function Tests: Recent Labs  Lab 02/24/24 1134  AST 14*  ALT 7  ALKPHOS 95  BILITOT 1.2  PROT 6.4*  ALBUMIN 3.1*   No results for input(s): "LIPASE", "AMYLASE" in the last 168 hours. No results for input(s): "AMMONIA" in the last 168 hours. Coagulation Profile: Recent Labs  Lab 02/24/24 1134  INR 1.2   Cardiac Enzymes: No results for input(s): "CKTOTAL", "CKMB", "CKMBINDEX", "TROPONINI" in the last 168 hours. BNP (last 3 results) No results for input(s): "PROBNP" in the last 8760 hours. HbA1C: No results for input(s): "HGBA1C" in the last 72 hours. CBG: No results for input(s): "GLUCAP" in the last 168 hours. Lipid Profile: No results for input(s): "CHOL", "HDL", "LDLCALC", "TRIG", "CHOLHDL", "LDLDIRECT" in the last 72 hours. Thyroid Function Tests: No results for input(s): "TSH", "T4TOTAL", "FREET4", "T3FREE", "THYROIDAB" in the last 72 hours. Anemia Panel: No results for input(s): "VITAMINB12", "FOLATE", "FERRITIN", "TIBC", "IRON", "RETICCTPCT" in the last 72 hours. Urine analysis:    Component Value Date/Time   COLORURINE YELLOW (A) 12/16/2022 0412   APPEARANCEUR CLEAR (A) 12/16/2022 0412   APPEARANCEUR Clear 06/10/2013 1212   LABSPEC 1.009 12/16/2022 0412   LABSPEC 1.019  06/10/2013 1212   PHURINE 6.0 12/16/2022 0412   GLUCOSEU NEGATIVE 12/16/2022 0412   GLUCOSEU Negative 06/10/2013 1212   HGBUR NEGATIVE 12/16/2022 0412   BILIRUBINUR NEGATIVE 12/16/2022 0412   BILIRUBINUR Negative 06/10/2013 1212   KETONESUR NEGATIVE 12/16/2022 0412   PROTEINUR NEGATIVE 12/16/2022 0412   NITRITE POSITIVE (A) 12/16/2022 0412   LEUKOCYTESUR TRACE (A) 12/16/2022 0412   LEUKOCYTESUR Negative 06/10/2013 1212    Radiological Exams on Admission: CT Angio Chest PE W/Cm &/Or Wo Cm Result Date: 02/24/2024  CLINICAL DATA:  Positive D-dimer, cough, leg swelling EXAM: CT ANGIOGRAPHY CHEST WITH CONTRAST TECHNIQUE: Multidetector CT imaging of the chest was performed using the standard protocol during bolus administration of intravenous contrast. Multiplanar CT image reconstructions and MIPs were obtained to evaluate the vascular anatomy. RADIATION DOSE REDUCTION: This exam was performed according to the departmental dose-optimization program which includes automated exposure control, adjustment of the mA and/or kV according to patient size and/or use of iterative reconstruction technique. CONTRAST:  75mL OMNIPAQUE IOHEXOL 350 MG/ML SOLN COMPARISON:  01/08/2023, 02/24/2024 FINDINGS: Cardiovascular: This is a technically suboptimal evaluation of the pulmonary vasculature, due to timing of the contrast bolus and respiratory motion during the study. I do not see any large filling defect within the main pulmonary artery, though the segmental and subsegmental branches cannot be fully evaluated due to incomplete opacification. Mild cardiomegaly without pericardial effusion. No evidence of thoracic aortic aneurysm or dissection. Atherosclerosis of the aorta and coronary vasculature. Mediastinum/Nodes: Stable appearance of the thyroid, trachea, and esophagus. No pathologic adenopathy. Lungs/Pleura: There are small bilateral pleural effusions. There is dependent atelectasis within the right lower lobe. There  is dense consolidation and volume loss within the right middle lobe and left lower lobe, consistent with atelectasis. No acute airspace disease. No pneumothorax. Central airways are patent. Upper Abdomen: No acute abnormality. Musculoskeletal: There are no acute displaced fractures. 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 Reconstructed images demonstrate no additional findings. Review of the MIP images confirms the above findings. IMPRESSION: 1. Limited evaluation of the pulmonary vasculature due to timing of contrast bolus and respiratory motion. I do not see any filling defect within the main pulmonary artery. The lobar, segmental, and subsegmental branches cannot be fully evaluated due to incomplete opacification. If pulmonary embolus remains a concern, repeat CT pulmonary angiography could be performed. 2. Small bilateral pleural effusions. 3. Atelectasis within the right middle, right lower, and left lower lobes as above. No acute airspace disease. 4. Cardiomegaly. 5.  Choose 1 coronary artery atherosclerosis. Electronically Signed   By: Sharlet Salina M.D.   On: 02/24/2024 17:11   DG Chest 2 View Result Date: 02/24/2024 CLINICAL DATA:  Cough. EXAM: CHEST - 2 VIEW COMPARISON:  01/08/2023. FINDINGS: There is homogeneous opacity at the right lung base, which is new since the prior study and is concerning for right lung lower lobe partial collapse. Bilateral lung fields are otherwise clear. There is blunting of bilateral posterior costophrenic angles, suggesting bilateral trace pleural effusions. Stable cardio-mediastinal silhouette. No acute osseous abnormalities. The soft tissues are within normal limits. IMPRESSION: There is homogeneous opacity at the right lung base, which is new since the prior study and is concerning for right lung lower lobe partial collapse. Bilateral trace pleural effusions. Electronically Signed   By: Jules Schick M.D.   On: 02/24/2024  12:29   CT Head Wo Contrast Result Date: 02/24/2024 CLINICAL DATA:  TIA, sensation of facial droop. EXAM: CT HEAD WITHOUT CONTRAST TECHNIQUE: Contiguous axial images were obtained from the base of the skull through the vertex without intravenous contrast. RADIATION DOSE REDUCTION: This exam was performed according to the departmental dose-optimization program which includes automated exposure control, adjustment of the mA and/or kV according to patient size and/or use of iterative reconstruction technique. COMPARISON:  MRI head 06/21/2013 FINDINGS: Brain: No acute intracranial hemorrhage. No CT evidence of acute infarct. Nonspecific hypoattenuation in the periventricular and subcortical white matter favored to reflect chronic microvascular ischemic changes. Focal hypoattenuation in  the inferior aspect of the right basal ganglia likely reflecting a prominent perivascular space. No edema, mass effect, or midline shift. The basilar cisterns are patent. Ventricles: The ventricles are normal. Vascular: Atherosclerotic calcifications of the carotid siphons. No hyperdense vessel. Skull: No acute or aggressive finding. Orbits: Orbits are symmetric. Sinuses: The visualized paranasal sinuses are clear. Other: Mastoid air cells are clear. IMPRESSION: No CT evidence of acute intracranial abnormality. Electronically Signed   By: Emily Filbert M.D.   On: 02/24/2024 11:59    EKG: Independently reviewed.  SR with first-degree AV block 87 bpm.  Assessment/Plan Principal Problem:   Acute hypoxemic respiratory failure (HCC) Active Problems:   CAD in native artery   Acute on chronic diastolic (congestive) heart failure (HCC)   Hypertension   Morbid obesity with BMI of 50.0-59.9, adult (HCC)   Hyperlipidemia associated with type 2 diabetes mellitus (HCC)   Tobacco abuse   Atherosclerosis of aorta (HCC)    Acute hypoxemic respiratory failure likely in the setting of acute diastolic heart failure exacerbation -Likely  in the setting of dietary indiscretion as patient eats out and snacks on potato chips.  States he is compliant with home Lasix 40 mg daily -Continue IV Lasix 40 mg IV twice daily while hospitalized -Monitor strict I's and O's and daily weights -DuoNebs as needed for shortness of breath or wheezing -Repeat 2D echocardiogram with prior noted to be June 2022 with LVEF 55-60%  History of COPD with chronic hypoxemia -Appears to be on 4 L nasal cannula -Currently without any acute bronchospasms, chest tightness, or cough -DuoNebs as needed for shortness of breath or wheezing -Does not appear to have a need for steroids  Facial droop with prior history of TIA/dyslipidemia -No other significant neurodeficits identified, and bilateral lower facial tightness initially noted which resolved very quickly.  Does not appear to require any further workup for this -Obtain 2D echocardiogram as above -Wheelchair-bound at baseline -Continue current anticoagulation with Eliquis -Continue statin and check fasting lipid profile, recent A1c 5.8%  History of previous saddle PE -Continue Eliquis for anticoagulation which patient has been compliant with -No findings of PE noted on CT chest angiogram  Chronic anemia -Appears to be near baseline, continue to monitor  History of type 2 diabetes -Without significant hyperglycemia and recent A1c 12/2022 5.8% -Carb modified diet and SSI  Morbid obesity, class III-wheelchair-bound -BMI 54.68 -Fall precautions  GERD -PPI   DVT prophylaxis: Eliquis Code Status: Full Family Communication: None at bedside Disposition Plan: Admit for dyspnea Consults called: None Admission status: Observation, telemetry  Severity of Illness: The appropriate patient status for this patient is OBSERVATION. Observation status is judged to be reasonable and necessary in order to provide the required intensity of service to ensure the patient's safety. The patient's presenting  symptoms, physical exam findings, and initial radiographic and laboratory data in the context of their medical condition is felt to place them at decreased risk for further clinical deterioration. Furthermore, it is anticipated that the patient will be medically stable for discharge from the hospital within 2 midnights of admission.    Michala Deblanc D Sherryll Burger DO [facility]  If 7PM-7AM, please contact night-coverage [email or web address]  02/24/2024, 6:39 PM

## 2024-02-24 NOTE — ED Triage Notes (Signed)
 Pt to ED via [facility] from home. EMS reports that pt started feeling the sensation of facial droop but never visualized any facial droop. Per EMS, pt reported that the the sensation stopped before EMS arrived. Pt has new onset of leg swelling and reports cough x1 week. Pt is wheelchair bound. Pt oriented x4. Pt reports wearing 2.5 L O2 at home chronically.   EMS vitals HR 70 BP 140/70 CBG 134

## 2024-02-25 ENCOUNTER — Observation Stay
Admit: 2024-02-25 | Discharge: 2024-02-25 | Disposition: A | Discharge status: Home / Self Care | Attending: Internal Medicine

## 2024-02-25 ENCOUNTER — Other Ambulatory Visit

## 2024-02-25 DIAGNOSIS — I110 Hypertensive heart disease with heart failure: Secondary | ICD-10-CM | POA: Diagnosis present

## 2024-02-25 DIAGNOSIS — Z720 Tobacco use: Secondary | ICD-10-CM | POA: Diagnosis not present

## 2024-02-25 DIAGNOSIS — R532 Functional quadriplegia: Secondary | ICD-10-CM | POA: Diagnosis not present

## 2024-02-25 DIAGNOSIS — I700 Atherosclerosis of aorta: Secondary | ICD-10-CM | POA: Diagnosis not present

## 2024-02-25 DIAGNOSIS — R29810 Facial weakness: Secondary | ICD-10-CM | POA: Diagnosis present

## 2024-02-25 DIAGNOSIS — K219 Gastro-esophageal reflux disease without esophagitis: Secondary | ICD-10-CM | POA: Diagnosis not present

## 2024-02-25 DIAGNOSIS — N1831 Chronic kidney disease, stage 3a: Secondary | ICD-10-CM | POA: Diagnosis not present

## 2024-02-25 DIAGNOSIS — E785 Hyperlipidemia, unspecified: Secondary | ICD-10-CM | POA: Diagnosis not present

## 2024-02-25 DIAGNOSIS — R6889 Other general symptoms and signs: Secondary | ICD-10-CM | POA: Diagnosis not present

## 2024-02-25 DIAGNOSIS — Z993 Dependence on wheelchair: Secondary | ICD-10-CM | POA: Diagnosis not present

## 2024-02-25 DIAGNOSIS — R0902 Hypoxemia: Secondary | ICD-10-CM | POA: Diagnosis not present

## 2024-02-25 DIAGNOSIS — I10 Essential (primary) hypertension: Secondary | ICD-10-CM | POA: Diagnosis not present

## 2024-02-25 DIAGNOSIS — J441 Chronic obstructive pulmonary disease with (acute) exacerbation: Secondary | ICD-10-CM | POA: Diagnosis present

## 2024-02-25 DIAGNOSIS — Z794 Long term (current) use of insulin: Secondary | ICD-10-CM | POA: Diagnosis not present

## 2024-02-25 DIAGNOSIS — Z79899 Other long term (current) drug therapy: Secondary | ICD-10-CM | POA: Diagnosis not present

## 2024-02-25 DIAGNOSIS — F17210 Nicotine dependence, cigarettes, uncomplicated: Secondary | ICD-10-CM | POA: Diagnosis present

## 2024-02-25 DIAGNOSIS — I2510 Atherosclerotic heart disease of native coronary artery without angina pectoris: Secondary | ICD-10-CM | POA: Diagnosis not present

## 2024-02-25 DIAGNOSIS — J9622 Acute and chronic respiratory failure with hypercapnia: Secondary | ICD-10-CM | POA: Diagnosis present

## 2024-02-25 DIAGNOSIS — G8929 Other chronic pain: Secondary | ICD-10-CM | POA: Diagnosis not present

## 2024-02-25 DIAGNOSIS — I5031 Acute diastolic (congestive) heart failure: Secondary | ICD-10-CM | POA: Diagnosis not present

## 2024-02-25 DIAGNOSIS — D509 Iron deficiency anemia, unspecified: Secondary | ICD-10-CM | POA: Diagnosis present

## 2024-02-25 DIAGNOSIS — Z9981 Dependence on supplemental oxygen: Secondary | ICD-10-CM | POA: Diagnosis not present

## 2024-02-25 DIAGNOSIS — M6281 Muscle weakness (generalized): Secondary | ICD-10-CM | POA: Diagnosis not present

## 2024-02-25 DIAGNOSIS — J9621 Acute and chronic respiratory failure with hypoxia: Secondary | ICD-10-CM | POA: Diagnosis present

## 2024-02-25 DIAGNOSIS — Z743 Need for continuous supervision: Secondary | ICD-10-CM | POA: Diagnosis not present

## 2024-02-25 DIAGNOSIS — R1310 Dysphagia, unspecified: Secondary | ICD-10-CM | POA: Diagnosis not present

## 2024-02-25 DIAGNOSIS — R0602 Shortness of breath: Secondary | ICD-10-CM | POA: Diagnosis present

## 2024-02-25 DIAGNOSIS — E538 Deficiency of other specified B group vitamins: Secondary | ICD-10-CM | POA: Diagnosis present

## 2024-02-25 DIAGNOSIS — M5441 Lumbago with sciatica, right side: Secondary | ICD-10-CM | POA: Diagnosis not present

## 2024-02-25 DIAGNOSIS — Z6841 Body Mass Index (BMI) 40.0 and over, adult: Secondary | ICD-10-CM | POA: Diagnosis not present

## 2024-02-25 DIAGNOSIS — Z7901 Long term (current) use of anticoagulants: Secondary | ICD-10-CM | POA: Diagnosis not present

## 2024-02-25 DIAGNOSIS — Z86711 Personal history of pulmonary embolism: Secondary | ICD-10-CM | POA: Diagnosis not present

## 2024-02-25 DIAGNOSIS — Z1152 Encounter for screening for COVID-19: Secondary | ICD-10-CM | POA: Diagnosis not present

## 2024-02-25 DIAGNOSIS — Z8673 Personal history of transient ischemic attack (TIA), and cerebral infarction without residual deficits: Secondary | ICD-10-CM | POA: Diagnosis not present

## 2024-02-25 DIAGNOSIS — Z716 Tobacco abuse counseling: Secondary | ICD-10-CM | POA: Diagnosis not present

## 2024-02-25 DIAGNOSIS — I5033 Acute on chronic diastolic (congestive) heart failure: Secondary | ICD-10-CM | POA: Diagnosis present

## 2024-02-25 DIAGNOSIS — J9601 Acute respiratory failure with hypoxia: Secondary | ICD-10-CM | POA: Diagnosis not present

## 2024-02-25 DIAGNOSIS — E66813 Obesity, class 3: Secondary | ICD-10-CM | POA: Diagnosis present

## 2024-02-25 DIAGNOSIS — E1169 Type 2 diabetes mellitus with other specified complication: Secondary | ICD-10-CM | POA: Diagnosis not present

## 2024-02-25 LAB — ECHOCARDIOGRAM COMPLETE
AR max vel: 2.89 cm2
AV Area VTI: 2.67 cm2
AV Area mean vel: 2.4 cm2
AV Mean grad: 3 mmHg
AV Peak grad: 4.8 mmHg
Ao pk vel: 1.1 m/s
Area-P 1/2: 4.49 cm2
Height: 73 in
MV VTI: 1.41 cm2
S' Lateral: 3.1 cm
Weight: 6627.91 [oz_av]

## 2024-02-25 LAB — MAGNESIUM: Magnesium: 2.1 mg/dL (ref 1.7–2.4)

## 2024-02-25 LAB — BASIC METABOLIC PANEL
Anion gap: 6 (ref 5–15)
BUN: 12 mg/dL (ref 8–23)
CO2: 37 mmol/L — ABNORMAL HIGH (ref 22–32)
Calcium: 8.5 mg/dL — ABNORMAL LOW (ref 8.9–10.3)
Chloride: 95 mmol/L — ABNORMAL LOW (ref 98–111)
Creatinine, Ser: 0.92 mg/dL (ref 0.61–1.24)
GFR, Estimated: >60 mL/min (ref >60)
Glucose, Bld: 152 mg/dL — ABNORMAL HIGH (ref 70–99)
Potassium: 4.4 mmol/L (ref 3.5–5.1)
Sodium: 138 mmol/L (ref 135–145)

## 2024-02-25 NOTE — Evaluation (Signed)
 Physical Therapy Evaluation Patient Details Name: Thomas Tanner MRN: [id number] DOB: [birth year] Today's Date: 02/25/2024  History of Present Illness  Pt is a 73 y.o. male presenting to hospital 02/24/24 with transient facial tightness; SOB and cough x1 week; B LE swelling x1 week.  Pt admitted with acute hypoxemic respiratory failure likely in setting of acute diastolic HF exacerbation.  PMH includes COPD on 4 L [place], DM, htn, HLD, HFpEF, saddle PE, htn, morbid obesity, TIA, tachycardia, and pulmonary thrombectomy 2021/2024.  Clinical Impression  Prior to recent medical concerns, pt reports being modified independent with w/c level transfers; lives with family in home with steps to enter (pt reports fire dept helps with getting him in/out of home); sleeps in power lift chair; modified independent with manual w/c use in home; uses supplemental O2 (pt reports unsure how much though).  PT/OT co-evaluation performed.  Pt initially sleeping upon therapy arrival and was slow to wake up but eventually pt became alert (pt reports feeling like his mind still wasn't clear/difficulty remembering things during session).  Pt's supplemental O2 on 3 L via nasal cannula was partially out of his nose upon therapy arrival with O2 sats 84% at rest in bed; once nasal cannula repositioned SpO2 sats improved to 89% on 3 L O2.  Currently pt is mod assist x2 with bed mobility; SBA with sitting balance; CGA x2 to stand up to bariatric RW (from elevated bed height) and side step to R along bed a few feet with walker use (limited d/t pt fatigue/generalized weakness; SOB noted with activity).  Pt's SpO2 sats on 3 L was 81% sitting on EOB; increased supplemental O2 initially to 4 L but minimal improvement noted so increased supplemental O2 to 5 L which improved pt's SpO2 sats to 89% on 5 L sitting on EOB.  SpO2 sats 98% on 5 L after taking steps along bed but decreased to 84% after laying back down in bed (with repositioning of  nasal cannula and vc's for pursed lip breathing pt's SpO2 sats improved to 90% on 5 L supplemental O2 and pt left on 5 L supplemental O2 end of session at rest).  Nurse updated on pt's status including SpO2 sats and O2 needs during session and that pt left on 5 L O2 end of session.  Pt would currently benefit from skilled PT to address noted impairments and functional limitations (see below for any additional details).  Upon hospital discharge, pt would benefit from ongoing therapy.     If plan is discharge home, recommend the following: A lot of help with walking and/or transfers;A little help with bathing/dressing/bathroom;Assistance with cooking/housework;Assist for transportation;Help with stairs or ramp for entrance   Can travel by private vehicle        Equipment Recommendations None recommended by PT (pt reports have manual w/c, RW, and BSC at home already)  Recommendations for Other Services       Functional Status Assessment Patient has had a recent decline in their functional status and demonstrates the ability to make significant improvements in function in a reasonable and predictable amount of time.     Precautions / Restrictions Precautions Precautions: Fall Restrictions Weight Bearing Restrictions Per Provider Order: No      Mobility  Bed Mobility Overal bed mobility: Needs Assistance Bed Mobility: Supine to Sit, Sit to Supine     Supine to sit: Mod assist, +2 for physical assistance, HOB elevated Sit to supine: Mod assist, +2 for physical assistance   General bed  mobility comments: heavy assist for B LE's; assist for trunk semi-supine to sitting; heavy use of bed rails    Transfers Overall transfer level: Needs assistance Equipment used: Rolling walker (2 wheels) (bariatric) Transfers: Sit to/from Stand Sit to Stand: Contact guard assist, +2 physical assistance, From elevated surface           General transfer comment: bed height elevated per pt request;  increased effort to stand up to walker    Ambulation/Gait Ambulation/Gait assistance: Contact guard assist, +2 physical assistance Gait Distance (Feet): 3 Feet (side stepped to R along bed a few feet with walker use) Assistive device: Rolling walker (2 wheels) (bariatric)   Gait velocity: decreased     General Gait Details: shuffling steps; decreased B LE step length/foot clearance sidestepping to R along bed; limited d/t pt fatigue  Stairs            Wheelchair Mobility     Tilt Bed    Modified Rankin (Stroke Patients Only)       Balance Overall balance assessment: Needs assistance Sitting-balance support: No upper extremity supported, Feet supported Sitting balance-Leahy Scale: Good Sitting balance - Comments: steady reaching within BOS   Standing balance support: Bilateral upper extremity supported, Reliant on assistive device for balance Standing balance-Leahy Scale: Fair Standing balance comment: steady static standing with B UE support on walker                             Pertinent Vitals/Pain Pain Assessment Pain Assessment: Faces Faces Pain Scale: Hurts little more Pain Location: posterior neck and B LE's Pain Descriptors / Indicators: Aching, Sore Pain Intervention(s): Limited activity within patient's tolerance, Monitored during session, Repositioned, Other (comment) (RN notified regarding pt's pain) HR stable during session.    Home Living Family/patient expects to be discharged to:: Private residence Living Arrangements: Spouse/significant other;Children (Pt's wife, son, and grandson) Available Help at Discharge: Family;Available 24 hours/day Type of Home: House Home Access: Stairs to enter Entrance Stairs-Rails: Right;Left;Can reach both Entrance Stairs-Number of Steps: 5   Home Layout: One level Home Equipment: Agricultural consultant (2 wheels);BSC/3in1;Wheelchair - manual;Lift chair Additional Comments: Pt reports he uses supplemental  O2 at home but not sure how much.    Prior Function Prior Level of Function : Needs assist             Mobility Comments: Pt able to transfer modified independently (w/c level); holds onto furniture but doesn't typically use RW for transfers; sleeps in power recliner typically; modified independent with w/c level functional mobility household distances; pt's son and grandson assist with mobility as needed ADLs Comments: Takes sponge baths about 1x/week (family brings pt items for sponge bathing bedside)     Extremity/Trunk Assessment   Upper Extremity Assessment Upper Extremity Assessment: Defer to OT evaluation    Lower Extremity Assessment Lower Extremity Assessment: Generalized weakness (4/5 B hip flexion; at least 4+/5 B knee flexion/extension and DF)    Cervical / Trunk Assessment Cervical / Trunk Assessment: Other exceptions Cervical / Trunk Exceptions: forward head/shoulders  Communication   Communication Communication: Other (comment) (pt initially very difficult to understand beginning of session but good communication noted once pt more alert)    Cognition Arousal:  (pt initially sleeping and very drowsy when woken (difficult to understand pt) but pt became much more alert during session) Behavior During Therapy: [facility] for tasks assessed/performed  PT - Cognition Comments: Oriented to name, DOB, year, Tuesday (pt did not state month when asked); pt initially did not know he was in the hospital but appeared much more aware later in session once more alert; mild increased time for activities and to respond to cues Following commands: Intact       Cueing Cueing Techniques: Verbal cues, Visual cues     General Comments  Pt agreeable to PT/OT session.     Exercises     Assessment/Plan    PT Assessment Patient needs continued PT services  PT Problem List Decreased strength;Decreased activity tolerance;Decreased  balance;Decreased mobility;Decreased cognition;Decreased knowledge of precautions;Cardiopulmonary status limiting activity;Pain       PT Treatment Interventions DME instruction;Functional mobility training;Therapeutic activities;Therapeutic exercise;Balance training;Patient/family education    PT Goals (Current goals can be found in the Care Plan section)  Acute Rehab PT Goals Patient Stated Goal: to improve strength and breathing PT Goal Formulation: With patient Time For Goal Achievement: 03/10/24 Potential to Achieve Goals: Good    Frequency Min 2X/week     Co-evaluation PT/OT/SLP Co-Evaluation/Treatment: Yes Reason for Co-Treatment: For patient/therapist safety;To address functional/ADL transfers PT goals addressed during session: Mobility/safety with mobility;Balance;Proper use of DME OT goals addressed during session: ADL's and self-care       AM-PAC PT "6 Clicks" Mobility  Outcome Measure Help needed turning from your back to your side while in a flat bed without using bedrails?: A Lot Help needed moving from lying on your back to sitting on the side of a flat bed without using bedrails?: Total Help needed moving to and from a bed to a chair (including a wheelchair)?: A Lot Help needed standing up from a chair using your arms (e.g., wheelchair or bedside chair)?: A Lot Help needed to walk in hospital room?: Total Help needed climbing 3-5 steps with a railing? : Total 6 Click Score: 9    End of Session Equipment Utilized During Treatment: Oxygen Activity Tolerance: Patient limited by fatigue Patient left: in bed;with call bell/phone within reach;with bed alarm set;Other (comment) (B LE's elevated via pillows) Nurse Communication: Mobility status;Precautions;Other (comment) (Pt's pain status; pt's SpO2 sats during session and need for increase in supplemental O2 from 3 to 5 L O2) PT Visit Diagnosis: Other abnormalities of gait and mobility (R26.89);Muscle weakness  (generalized) (M62.81);Pain Pain - Right/Left:  (B) Pain - part of body: Knee;Leg;Hip    Time: 1610-9604 PT Time Calculation (min) (ACUTE ONLY): 47 min   Charges:   PT Evaluation $PT Eval Low Complexity: 1 Low PT Treatments $Therapeutic Activity: 8-22 mins PT General Charges $$ ACUTE PT VISIT: 1 Visit        Hendricks Limes, PT 02/25/24, 3:13 PM

## 2024-02-25 NOTE — Plan of Care (Signed)

## 2024-02-25 NOTE — Progress Notes (Signed)
 Progress Note    Thomas Tanner  VOZ:[id number] DOB: [birth year]  DOA: 02/24/2024 PCP: Sallyanne Kuster, NP      Brief Narrative:    Medical records reviewed and are as summarized below:  Thomas Tanner is a 73 y.o. male  with medical history significant for diastolic CHF with EF 55-60%, COPD with chronic hypoxemia 4 L nasal cannula, prior history saddle PE on anticoagulation, type 2 diabetes, hypertension, dyslipidemia, prior TIA, and morbid obesity who presented to the ED with initial concerns of lower face tightness worse on the right as well as questionable bilateral facial droop.  EMS was called for evaluation of potential stroke.  However, EMS did not notice any focal deficits.  He also complained of shortness of breath, leg swelling and cough for approximately 1 week.  He recently completed a Z-Pak for upper respiratory tract infection.  He is medically adherent to Lasix treatment but eats indiscriminately.      Assessment/Plan:   Principal Problem:   Acute hypoxemic respiratory failure (HCC) Active Problems:   CAD in native artery   Acute on chronic diastolic (congestive) heart failure (HCC)   Hypertension   Morbid obesity with BMI of 50.0-59.9, adult (HCC)   Hyperlipidemia associated with type 2 diabetes mellitus (HCC)   Tobacco abuse   Atherosclerosis of aorta (HCC)    Body mass index is 54.65 kg/m.  (Morbid obesity)    Acute on chronic diastolic CHF: Continue IV Lasix.  Monitor BMP, daily weight and urine output. BNP 101.8.  Troponin is negative. 2D echo is pending.   Acute on chronic hypoxic respiratory failure, chronic hypercapnic respiratory failure: He is on 4 L/min oxygen.  He uses 2.5 L oxygen at home.  Taper down oxygen as able.   History of TIA, history of saddle pulmonary embolism: Continue Eliquis.   Comorbidities include COPD, chronic anemia, type II DM, morbid obesity, wheelchair-bound, GERD    Diet Order              Diet heart healthy/carb modified Room service appropriate? Yes; Fluid consistency: Thin; Fluid restriction: 1500 mL Fluid  Diet effective now                            Consultants: None  Procedures: None    Medications:    apixaban  5 mg Oral BID   cyanocobalamin  1,000 mcg Oral Daily   famotidine  40 mg Oral Daily   furosemide  40 mg Intravenous Q12H   iron polysaccharides  150 mg Oral Daily   loratadine  10 mg Oral Daily   metoprolol succinate  50 mg Oral Daily   pantoprazole  20 mg Oral Q breakfast   rosuvastatin  20 mg Oral Daily   senna-docusate  1 tablet Oral BID   sodium chloride flush  3 mL Intravenous Q12H   Continuous Infusions:  sodium chloride       Anti-infectives (From admission, onward)    None              Family Communication/Anticipated D/C date and plan/Code Status   DVT prophylaxis:  apixaban (ELIQUIS) tablet 5 mg     Code Status: Full Code  Family Communication: None Disposition Plan: Plan to discharge home   Status is: Observation The patient will require care spanning > 2 midnights and should be moved to inpatient because: CHF exacerbation       Subjective:   Interval  events noted.  He still has some shortness of breath with little activity although he feels better.  Leg swelling is improving.  Objective:    Vitals:   02/24/24 2218 02/24/24 2301 02/25/24 0231 02/25/24 0746  BP: (!) 140/77  137/61 (!) 136/55  Pulse: 88  97 81  Resp: (!) 24  (!) 22 (!) 24  Temp: 98 F (36.7 C)  98.2 F (36.8 C) (!) 97.5 F (36.4 C)  TempSrc: Oral  Oral Oral  SpO2: 93%  90% 93%  Weight:  (!) 186.3 kg  (!) 187.9 kg  Height:       No data found.   Intake/Output Summary (Last 24 hours) at 02/25/2024 1031 Last data filed at 02/25/2024 0123 Gross per 24 hour  Intake --  Output 2225 ml  Net -2225 ml   Filed Weights   02/24/24 1131 02/24/24 2301 02/25/24 0746  Weight: (!) 188 kg (!) 186.3 kg (!) 187.9 kg     Exam:  GEN: NAD SKIN: Warm and dry EYES: No pallor or icterus ENT: MMM CV: RRR PULM: CTA B ABD: soft, obese, NT, +BS CNS: AAO x 3, non focal EXT: B/l leg edema, no tenderness       Data Reviewed:   I have personally reviewed following labs and imaging studies:  Labs: Labs show the following:   Basic Metabolic Panel: Recent Labs  Lab 02/24/24 1134 02/25/24 0618  NA 140 138  K 4.2 4.4  CL 98 95*  CO2 35* 37*  GLUCOSE 114* 152*  BUN 13 12  CREATININE 0.81 0.92  CALCIUM 8.5* 8.5*  MG  --  2.1   GFR Estimated Creatinine Clearance: 128.2 mL/min (by C-G formula based on SCr of 0.92 mg/dL). Liver Function Tests: Recent Labs  Lab 02/24/24 1134  AST 14*  ALT 7  ALKPHOS 95  BILITOT 1.2  PROT 6.4*  ALBUMIN 3.1*   No results for input(s): "LIPASE", "AMYLASE" in the last 168 hours. No results for input(s): "AMMONIA" in the last 168 hours. Coagulation profile Recent Labs  Lab 02/24/24 1134  INR 1.2    CBC: Recent Labs  Lab 02/24/24 1134  WBC 6.7  NEUTROABS 5.3  HGB 8.9*  HCT 32.3*  MCV 84.3  PLT 189   Cardiac Enzymes: No results for input(s): "CKTOTAL", "CKMB", "CKMBINDEX", "TROPONINI" in the last 168 hours. BNP (last 3 results) No results for input(s): "PROBNP" in the last 8760 hours. CBG: No results for input(s): "GLUCAP" in the last 168 hours. D-Dimer: Recent Labs    02/24/24 1134  DDIMER 0.88*   Hgb A1c: No results for input(s): "HGBA1C" in the last 72 hours. Lipid Profile: Recent Labs    02/24/24 2220  CHOL 111  HDL 45  LDLCALC 53  TRIG 67  CHOLHDL 2.5   Thyroid function studies: No results for input(s): "TSH", "T4TOTAL", "T3FREE", "THYROIDAB" in the last 72 hours.  Invalid input(s): "FREET3" Anemia work up: No results for input(s): "VITAMINB12", "FOLATE", "FERRITIN", "TIBC", "IRON", "RETICCTPCT" in the last 72 hours. Sepsis Labs: Recent Labs  Lab 02/24/24 1134  WBC 6.7    Microbiology Recent Results (from the  past 240 hours)  Resp panel by RT-PCR (RSV, Flu A&B, Covid) Anterior Nasal Swab     Status: None   Collection Time: 02/24/24 11:35 AM   Specimen: Anterior Nasal Swab  Result Value Ref Range Status   SARS Coronavirus 2 by RT PCR NEGATIVE NEGATIVE Final    Comment: (NOTE) SARS-CoV-2 target nucleic acids are NOT  DETECTED.  The SARS-CoV-2 RNA is generally detectable in upper respiratory specimens during the acute phase of infection. The lowest concentration of SARS-CoV-2 viral copies this assay can detect is 138 copies/mL. A negative result does not preclude SARS-Cov-2 infection and should not be used as the sole basis for treatment or other patient management decisions. A negative result may occur with  improper specimen collection/handling, submission of specimen other than nasopharyngeal swab, presence of viral mutation(s) within the areas targeted by this assay, and inadequate number of viral copies(<138 copies/mL). A negative result must be combined with clinical observations, patient history, and epidemiological information. The expected result is Negative.  Fact Sheet for Patients:  BloggerCourse.com  Fact Sheet for Healthcare Providers:  SeriousBroker.it  This test is no t yet approved or cleared by the [place] FDA and  has been authorized for detection and/or diagnosis of SARS-CoV-2 by FDA under an Emergency Use Authorization (EUA). This EUA will remain  in effect (meaning this test can be used) for the duration of the COVID-19 declaration under Section 564(b)(1) of the Act, 21 U.S.C.section 360bbb-3(b)(1), unless the authorization is terminated  or revoked sooner.       Influenza A by PCR NEGATIVE NEGATIVE Final   Influenza B by PCR NEGATIVE NEGATIVE Final    Comment: (NOTE) The Xpert Xpress SARS-CoV-2/FLU/RSV plus assay is intended as an aid in the diagnosis of influenza from Nasopharyngeal swab specimens  and should not be used as a sole basis for treatment. Nasal washings and aspirates are unacceptable for Xpert Xpress SARS-CoV-2/FLU/RSV testing.  Fact Sheet for Patients: BloggerCourse.com  Fact Sheet for Healthcare Providers: SeriousBroker.it  This test is not yet approved or cleared by the [place] FDA and has been authorized for detection and/or diagnosis of SARS-CoV-2 by FDA under an Emergency Use Authorization (EUA). This EUA will remain in effect (meaning this test can be used) for the duration of the COVID-19 declaration under Section 564(b)(1) of the Act, 21 U.S.C. section 360bbb-3(b)(1), unless the authorization is terminated or revoked.     Resp Syncytial Virus by PCR NEGATIVE NEGATIVE Final    Comment: (NOTE) Fact Sheet for Patients: BloggerCourse.com  Fact Sheet for Healthcare Providers: SeriousBroker.it  This test is not yet approved or cleared by the [place] FDA and has been authorized for detection and/or diagnosis of SARS-CoV-2 by FDA under an Emergency Use Authorization (EUA). This EUA will remain in effect (meaning this test can be used) for the duration of the COVID-19 declaration under Section 564(b)(1) of the Act, 21 U.S.C. section 360bbb-3(b)(1), unless the authorization is terminated or revoked.  Performed at [facility], [address] [place], [place] [address]     Procedures and diagnostic studies:  CT Angio Chest PE W/Cm &/Or Wo Cm Result Date: 02/24/2024 CLINICAL DATA:  Positive D-dimer, cough, leg swelling EXAM: CT ANGIOGRAPHY CHEST WITH CONTRAST TECHNIQUE: Multidetector CT imaging of the chest was performed using the standard protocol during bolus administration of intravenous contrast. Multiplanar CT image reconstructions and MIPs were obtained to evaluate the vascular anatomy. RADIATION DOSE REDUCTION: This exam was  performed according to the departmental dose-optimization program which includes automated exposure control, adjustment of the mA and/or kV according to patient size and/or use of iterative reconstruction technique. CONTRAST:  75mL OMNIPAQUE IOHEXOL 350 MG/ML SOLN COMPARISON:  01/08/2023, 02/24/2024 FINDINGS: Cardiovascular: This is a technically suboptimal evaluation of the pulmonary vasculature, due to timing of the contrast bolus and respiratory motion during the study. I do not see  any large filling defect within the main pulmonary artery, though the segmental and subsegmental branches cannot be fully evaluated due to incomplete opacification. Mild cardiomegaly without pericardial effusion. No evidence of thoracic aortic aneurysm or dissection. Atherosclerosis of the aorta and coronary vasculature. Mediastinum/Nodes: Stable appearance of the thyroid, trachea, and esophagus. No pathologic adenopathy. Lungs/Pleura: There are small bilateral pleural effusions. There is dependent atelectasis within the right lower lobe. There is dense consolidation and volume loss within the right middle lobe and left lower lobe, consistent with atelectasis. No acute airspace disease. No pneumothorax. Central airways are patent. Upper Abdomen: No acute abnormality. Musculoskeletal: There are no acute displaced fractures. 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 Reconstructed images demonstrate no additional findings. Review of the MIP images confirms the above findings. IMPRESSION: 1. Limited evaluation of the pulmonary vasculature due to timing of contrast bolus and respiratory motion. I do not see any filling defect within the main pulmonary artery. The lobar, segmental, and subsegmental branches cannot be fully evaluated due to incomplete opacification. If pulmonary embolus remains a concern, repeat CT pulmonary angiography could be performed. 2. Small bilateral pleural effusions. 3.  Atelectasis within the right middle, right lower, and left lower lobes as above. No acute airspace disease. 4. Cardiomegaly. 5.  Choose 1 coronary artery atherosclerosis. Electronically Signed   By: Sharlet Salina M.D.   On: 02/24/2024 17:11   DG Chest 2 View Result Date: 02/24/2024 CLINICAL DATA:  Cough. EXAM: CHEST - 2 VIEW COMPARISON:  01/08/2023. FINDINGS: There is homogeneous opacity at the right lung base, which is new since the prior study and is concerning for right lung lower lobe partial collapse. Bilateral lung fields are otherwise clear. There is blunting of bilateral posterior costophrenic angles, suggesting bilateral trace pleural effusions. Stable cardio-mediastinal silhouette. No acute osseous abnormalities. The soft tissues are within normal limits. IMPRESSION: There is homogeneous opacity at the right lung base, which is new since the prior study and is concerning for right lung lower lobe partial collapse. Bilateral trace pleural effusions. Electronically Signed   By: Jules Schick M.D.   On: 02/24/2024 12:29   CT Head Wo Contrast Result Date: 02/24/2024 CLINICAL DATA:  TIA, sensation of facial droop. EXAM: CT HEAD WITHOUT CONTRAST TECHNIQUE: Contiguous axial images were obtained from the base of the skull through the vertex without intravenous contrast. RADIATION DOSE REDUCTION: This exam was performed according to the departmental dose-optimization program which includes automated exposure control, adjustment of the mA and/or kV according to patient size and/or use of iterative reconstruction technique. COMPARISON:  MRI head 06/21/2013 FINDINGS: Brain: No acute intracranial hemorrhage. No CT evidence of acute infarct. Nonspecific hypoattenuation in the periventricular and subcortical white matter favored to reflect chronic microvascular ischemic changes. Focal hypoattenuation in the inferior aspect of the right basal ganglia likely reflecting a prominent perivascular space. No edema, mass  effect, or midline shift. The basilar cisterns are patent. Ventricles: The ventricles are normal. Vascular: Atherosclerotic calcifications of the carotid siphons. No hyperdense vessel. Skull: No acute or aggressive finding. Orbits: Orbits are symmetric. Sinuses: The visualized paranasal sinuses are clear. Other: Mastoid air cells are clear. IMPRESSION: No CT evidence of acute intracranial abnormality. Electronically Signed   By: Emily Filbert M.D.   On: 02/24/2024 11:59               LOS: 0 days   Truxton Stupka  [facility]   Pager on [email or web address]. If 7PM-7AM, please contact night-coverage at  [email or web address]     02/25/2024, 10:31 AM

## 2024-02-25 NOTE — Progress Notes (Signed)
 Heart Failure Navigator Progress Note  Assessed for Heart & Vascular TOC clinic readiness.  Patient does not meet criteria due to current [facility] patient.   Navigator will sign off at this time.  Roxy Horseman, RN, BSN [facility] Heart Failure Navigator Secure Chat Only

## 2024-02-25 NOTE — Plan of Care (Signed)

## 2024-02-25 NOTE — Plan of Care (Signed)
  Problem: Education: Goal: Knowledge of General Education information will improve Description: Including pain rating scale, medication(s)/side effects and non-pharmacologic comfort measures 02/25/2024 2323 by Azucena Kuba, RN Outcome: Progressing 02/25/2024 2319 by Azucena Kuba, RN Outcome: Progressing   Problem: Nutrition: Goal: Adequate nutrition will be maintained Outcome: Progressing   Problem: Coping: Goal: Level of anxiety will decrease Outcome: Progressing   Problem: Elimination: Goal: Will not experience complications related to urinary retention Outcome: Progressing   Problem: Pain Managment: Goal: General experience of comfort will improve and/or be controlled 02/25/2024 2323 by Azucena Kuba, RN Outcome: Progressing 02/25/2024 2319 by Azucena Kuba, RN Outcome: Progressing   Problem: Safety: Goal: Ability to remain free from injury will improve 02/25/2024 2323 by Azucena Kuba, RN Outcome: Progressing 02/25/2024 2319 by Azucena Kuba, RN Outcome: Progressing   Problem: Skin Integrity: Goal: Risk for impaired skin integrity will decrease Outcome: Progressing

## 2024-02-25 NOTE — Progress Notes (Signed)
 Occupational Therapy Evaluation Patient Details Name: Thomas Tanner MRN: [id number] DOB: 21-Aug-1951 Today's Date: 02/25/2024   History of Present Illness   Pt is a 73 y.o. male presenting to hospital 02/24/24 with transient facial tightness; SOB and cough x1 week; B LE swelling x1 week.  Pt admitted with acute hypoxemic respiratory failure likely in setting of acute diastolic HF exacerbation.  PMH includes COPD on 4 L [place], DM, htn, HLD, HFpEF, saddle PE, htn, morbid obesity, TIA, tachycardia, and pulmonary thrombectomy 2021/2024.     Clinical Impressions Pt was seen for OT/PT co evaluation this date. Prior to hospital admission, pt was able to transfer MODI (w/c level); holds onto furniture but doesn't typically use RW for transfers. MODI with w/c level functional mobility household distances; pt's son and grandson assist with mobility as needed. Pt reports he uses supplemental O2 at home but not sure how much. Pt lives with his wife and son. On arrival to room pt resting at Sp02 84% with [place] not fully in nose, 3L. Pt presents to acute OT demonstrating impaired ADL performance, generalized weakness and functional mobility (See OT problem list for additional functional deficits). Pt currently requires MAXA for donning socks at bed level. Pt required MODA + 2 for LE management during bed mobility, CGA +2 for STS from elevated EOB. Pt took 3 lateral steps up the bed with CGA + 2 with RW, heavy reliant on UE support from RW. Pt would benefit from skilled OT services to address noted impairments and functional limitations (see below for any additional details) in order to maximize safety and independence while minimizing falls risk and caregiver burden. OT will follow acutely.   The patient's SpO2 levels were 81% on 3L oxygen while sitting on the EOB. Supplemental oxygen was increased to 4L initially, but there was minimal improvement, so it was further increased to 5L, which raised the SpO2 to 89%  while sitting on the EOB. After the patient took steps along the bed, SpO2 improved to 98% on 5L but then decreased to 84%. Once the patient returned to bed, further adjustments to the nasal cannula ([place]) and the use of pursed-lip breathing exercises (vcs) helped increase SpO2 to 90% on 5L. The patient will remain on 5L to recover from mobility. The RN was notified.      If plan is discharge home, recommend the following:   A little help with walking and/or transfers;Assistance with cooking/housework;Assistance with feeding;Assist for transportation;Help with stairs or ramp for entrance;A lot of help with bathing/dressing/bathroom     Functional Status Assessment   Patient has had a recent decline in their functional status and demonstrates the ability to make significant improvements in function in a reasonable and predictable amount of time.     Equipment Recommendations   Other (comment) (Defer)     Recommendations for Other Services         Precautions/Restrictions   Precautions Precautions: Fall Recall of Precautions/Restrictions: Intact Restrictions Weight Bearing Restrictions Per Provider Order: No     Mobility Bed Mobility Overal bed mobility: Needs Assistance Bed Mobility: Supine to Sit, Sit to Supine     Supine to sit: Mod assist, +2 for physical assistance, HOB elevated Sit to supine: Mod assist, +2 for physical assistance   General bed mobility comments: heavy assist for B LE's; assist for trunk semi-supine to sitting; heavy use of bed rails    Transfers Overall transfer level: Needs assistance Equipment used: Rolling walker (2 wheels) Transfers: Sit to/from Stand  Sit to Stand: Contact guard assist, +2 physical assistance, From elevated surface           General transfer comment: Elevated bed height and extra time to complete      Balance Overall balance assessment: Needs assistance Sitting-balance support: No upper extremity supported,  Feet supported Sitting balance-Leahy Scale: Good Sitting balance - Comments: Good dynamic reaching within BOS   Standing balance support: Bilateral upper extremity supported, Reliant on assistive device for balance Standing balance-Leahy Scale: Fair Standing balance comment: Static standing, heavy reliant on RW                           ADL either performed or assessed with clinical judgement   ADL Overall ADL's : Needs assistance/impaired     Grooming: Wash/dry face;Set up;Sitting               Lower Body Dressing: Maximal assistance;Bed level               Functional mobility during ADLs: Contact guard assist;Rolling walker (2 wheels);+2 for safety/equipment General ADL Comments: MaxA required to don socks at bed level, washing face to assist in waking up, verbal cues and set up required     Vision Baseline Vision/History: 1 Wears glasses Vision Assessment?: Yes Ocular Range of Motion: Within Functional Limits Alignment/Gaze Preference: Within Defined Limits Tracking/Visual Pursuits: Able to track stimulus in all quads without difficulty Additional Comments: Pt reports having unclear peripheral vision, does not further elaborate, dispite further questions.     Perception         Praxis         Pertinent Vitals/Pain Pain Assessment Pain Assessment: Faces Faces Pain Scale: Hurts little more Pain Location: posterior neck and B LE's Pain Descriptors / Indicators: Aching, Sore Pain Intervention(s): Limited activity within patient's tolerance, Monitored during session, Repositioned     Extremity/Trunk Assessment Upper Extremity Assessment Upper Extremity Assessment: Overall WFL for tasks assessed   Lower Extremity Assessment Lower Extremity Assessment: Overall WFL for tasks assessed;Defer to PT evaluation   Cervical / Trunk Assessment Cervical / Trunk Assessment: Other exceptions Cervical / Trunk Exceptions: forward head/shoulders    Communication Communication Communication: Other (comment)   Cognition Arousal: Alert Behavior During Therapy: WFL for tasks assessed/performed Cognition: No family/caregiver present to determine baseline             OT - Cognition Comments: A/Ox4                 Following commands: Intact       Cueing  General Comments   Cueing Techniques: Verbal cues;Visual cues  Extra time required for pt to become alert, pt asleep and disoriented when he woke up. Alertness and orientation improved after cold wash cloth and mobility.   Exercises Exercises: Other exercises Other Exercises Other Exercises: Edu: role of OT/PT in rehab setting   Shoulder Instructions      Home Living Family/patient expects to be discharged to:: Private residence Living Arrangements: Spouse/significant other;Children Available Help at Discharge: Family;Available 24 hours/day Type of Home: House Home Access: Stairs to enter [employer] of Steps: 5 Entrance Stairs-Rails: Right;Left;Can reach both Home Layout: One level     Bathroom Shower/Tub: Chief Strategy Officer: Standard     Home Equipment: Agricultural consultant (2 wheels);BSC/3in1;Wheelchair - manual;Lift chair   Additional Comments: Pt reports he uses supplemental O2 at home but not sure how much.  Prior Functioning/Environment Prior Level of Function : Needs assist             Mobility Comments: Pt able to transfer modified independently (w/c level); holds onto furniture but doesn't typically use RW for transfers; sleeps in power recliner typically; modified independent with w/c level functional mobility household distances; pt's son and grandson assist with mobility as needed ADLs Comments: Takes sponge baths about 1x/week (family brings pt items for sponge bathing bedside)    OT Problem List: Decreased activity tolerance;Impaired balance (sitting and/or standing);Decreased coordination;Decreased safety  awareness;Decreased knowledge of use of DME or AE   OT Treatment/Interventions: Self-care/ADL training;Energy conservation;DME and/or AE instruction;Cognitive remediation/compensation;Therapeutic activities;Patient/family education;Therapeutic exercise      OT Goals(Current goals can be found in the care plan section)   Acute Rehab OT Goals Patient Stated Goal: to get better OT Goal Formulation: With patient Time For Goal Achievement: 03/10/24 Potential to Achieve Goals: Good ADL Goals Pt Will Perform Grooming: with min assist;sitting Pt Will Perform Lower Body Dressing: with max assist Pt Will Transfer to Toilet: stand pivot transfer;bedside commode;with modified independence Pt Will Perform Toileting - Clothing Manipulation and hygiene: with max assist;sit to/from stand   OT Frequency:  Min 2X/week    Co-evaluation PT/OT/SLP Co-Evaluation/Treatment: Yes Reason for Co-Treatment: For patient/therapist safety;To address functional/ADL transfers PT goals addressed during session: Mobility/safety with mobility;Balance;Proper use of DME OT goals addressed during session: ADL's and self-care      AM-PAC OT "6 Clicks" Daily Activity     Outcome Measure Help from another person eating meals?: A Little Help from another person taking care of personal grooming?: A Lot Help from another person toileting, which includes using toliet, bedpan, or urinal?: A Little Help from another person bathing (including washing, rinsing, drying)?: A Little Help from another person to put on and taking off regular upper body clothing?: A Little Help from another person to put on and taking off regular lower body clothing?: A Lot 6 Click Score: 16   End of Session Equipment Utilized During Treatment: Rolling walker (2 wheels) Nurse Communication: Mobility status  Activity Tolerance: Patient tolerated treatment well Patient left: in bed;with call bell/phone within reach;with bed alarm set  OT Visit  Diagnosis: Unsteadiness on feet (R26.81);Muscle weakness (generalized) (M62.81)                Time: 1610-9604 OT Time Calculation (min): 49 min Charges:  OT General Charges $OT Visit: 1 Visit OT Evaluation $OT Eval Moderate Complexity: 1 Mod OT Treatments $Self Care/Home Management : 8-22 mins  Glenard Haring M.S. OTR/L  02/25/24, 4:17 PM

## 2024-02-25 NOTE — Progress Notes (Signed)
*  PRELIMINARY RESULTS* Echocardiogram 2D Echocardiogram has been performed.  [name] 02/25/2024, 1:20 PM

## 2024-02-26 DIAGNOSIS — J9601 Acute respiratory failure with hypoxia: Secondary | ICD-10-CM | POA: Diagnosis not present

## 2024-02-26 DIAGNOSIS — Z6841 Body Mass Index (BMI) 40.0 and over, adult: Secondary | ICD-10-CM

## 2024-02-26 DIAGNOSIS — I5033 Acute on chronic diastolic (congestive) heart failure: Secondary | ICD-10-CM | POA: Diagnosis not present

## 2024-02-26 LAB — IRON AND TIBC
Iron: 26 ug/dL — ABNORMAL LOW (ref 45–182)
Saturation Ratios: 5 % — ABNORMAL LOW (ref 17.9–39.5)
TIBC: 480 ug/dL — ABNORMAL HIGH (ref 250–450)
UIBC: 454 ug/dL

## 2024-02-26 LAB — BASIC METABOLIC PANEL
Anion gap: 6 (ref 5–15)
BUN: 16 mg/dL (ref 8–23)
CO2: 44 mmol/L — ABNORMAL HIGH (ref 22–32)
Calcium: 8.3 mg/dL — ABNORMAL LOW (ref 8.9–10.3)
Chloride: 93 mmol/L — ABNORMAL LOW (ref 98–111)
Creatinine, Ser: 1.07 mg/dL (ref 0.61–1.24)
GFR, Estimated: >60 mL/min (ref >60)
Glucose, Bld: 109 mg/dL — ABNORMAL HIGH (ref 70–99)
Potassium: 3.9 mmol/L (ref 3.5–5.1)
Sodium: 143 mmol/L (ref 135–145)

## 2024-02-26 LAB — CBC
HCT: 34.8 % — ABNORMAL LOW (ref 39.0–52.0)
Hemoglobin: 9 g/dL — ABNORMAL LOW (ref 13.0–17.0)
MCH: 23 pg — ABNORMAL LOW (ref 26.0–34.0)
MCHC: 25.9 g/dL — ABNORMAL LOW (ref 30.0–36.0)
MCV: 88.8 fL (ref 80.0–100.0)
Platelets: 185 10*3/uL (ref 150–400)
RBC: 3.92 MIL/uL — ABNORMAL LOW (ref 4.22–5.81)
RDW: 20.1 % — ABNORMAL HIGH (ref 11.5–15.5)
WBC: 7.3 10*3/uL (ref 4.0–10.5)
nRBC: 0 % (ref 0.0–0.2)

## 2024-02-26 LAB — GLUCOSE, CAPILLARY
Glucose-Capillary: 105 mg/dL — ABNORMAL HIGH (ref 70–99)
Glucose-Capillary: 124 mg/dL — ABNORMAL HIGH (ref 70–99)

## 2024-02-26 LAB — MAGNESIUM: Magnesium: 2.2 mg/dL (ref 1.7–2.4)

## 2024-02-26 LAB — VITAMIN B12: Vitamin B-12: 215 pg/mL (ref 180–914)

## 2024-02-26 LAB — HEMOGLOBIN A1C
Hgb A1c MFr Bld: 5.1 % (ref 4.8–5.6)
Mean Plasma Glucose: 99.67 mg/dL

## 2024-02-26 LAB — FERRITIN: Ferritin: 8 ng/mL — ABNORMAL LOW (ref 24–336)

## 2024-02-26 MED ORDER — IPRATROPIUM-ALBUTEROL 0.5-2.5 (3) MG/3ML IN SOLN
3.0000 mL | Freq: Four times a day (QID) | RESPIRATORY_TRACT | Status: DC
  Administered 2024-02-26: 3 mL via RESPIRATORY_TRACT
  Filled 2024-02-26: qty 3

## 2024-02-26 MED ORDER — INSULIN ASPART 100 UNIT/ML IJ SOLN
0.0000 [IU] | Freq: Three times a day (TID) | INTRAMUSCULAR | Status: DC
  Administered 2024-02-28 – 2024-02-29 (×3): 2 [IU] via SUBCUTANEOUS
  Administered 2024-02-29: 5 [IU] via SUBCUTANEOUS
  Filled 2024-02-26 (×5): qty 1

## 2024-02-26 MED ORDER — GUAIFENESIN 100 MG/5ML PO LIQD
5.0000 mL | ORAL | Status: DC | PRN
  Administered 2024-02-26: 5 mL via ORAL
  Filled 2024-02-26: qty 10

## 2024-02-26 MED ORDER — CYANOCOBALAMIN 1000 MCG/ML IJ SOLN
1000.0000 ug | Freq: Once | INTRAMUSCULAR | Status: AC
  Administered 2024-02-26: 1000 ug via INTRAMUSCULAR
  Filled 2024-02-26: qty 1

## 2024-02-26 MED ORDER — FLUTICASONE FUROATE-VILANTEROL 200-25 MCG/ACT IN AEPB
1.0000 | INHALATION_SPRAY | Freq: Every day | RESPIRATORY_TRACT | Status: DC
  Administered 2024-02-26 – 2024-02-29 (×4): 1 via RESPIRATORY_TRACT
  Filled 2024-02-26: qty 28

## 2024-02-26 MED ORDER — IPRATROPIUM-ALBUTEROL 0.5-2.5 (3) MG/3ML IN SOLN
3.0000 mL | Freq: Three times a day (TID) | RESPIRATORY_TRACT | Status: DC
  Administered 2024-02-26 – 2024-02-27 (×3): 3 mL via RESPIRATORY_TRACT
  Filled 2024-02-26 (×3): qty 3

## 2024-02-26 MED ORDER — ALBUTEROL SULFATE (2.5 MG/3ML) 0.083% IN NEBU
3.0000 mL | INHALATION_SOLUTION | RESPIRATORY_TRACT | Status: DC | PRN
  Administered 2024-02-26: 3 mL via RESPIRATORY_TRACT
  Filled 2024-02-26: qty 3

## 2024-02-26 MED ORDER — IRON SUCROSE 300 MG IVPB - SIMPLE MED
300.0000 mg | Freq: Once | Status: AC
  Administered 2024-02-26: 300 mg via INTRAVENOUS
  Filled 2024-02-26: qty 300

## 2024-02-26 NOTE — TOC Initial Note (Signed)
 Transition of Care [facility]) - Initial/Assessment Note    Patient Details  Name: Thomas Tanner MRN: [id number] Date of Birth: 1950-12-26  [facility]) CM/SW Contact:    Chapman Fitch, RN Phone Number: 02/26/2024, 4:22 PM  Clinical Narrative:                 Admitted for: respiratory failure  Admitted from: home with wife, son, and grandson PCP: [facility]  Current home health/prior home health/DME: RW, BSC, WC lift chair  Therapy recommends SNF.  Patient in agreement.  Existing PASRR Bed search started           Patient Goals and CMS Choice            Expected Discharge Plan and Services                                              Prior Living Arrangements/Services                       Activities of Daily Living   ADL Screening (condition at time of admission) Independently performs ADLs?: No Does the patient have a NEW difficulty with bathing/dressing/toileting/self-feeding that is expected to last >3 days?: No Does the patient have a NEW difficulty with getting in/out of bed, walking, or climbing stairs that is expected to last >3 days?: No Does the patient have a NEW difficulty with communication that is expected to last >3 days?: No Is the patient deaf or have difficulty hearing?: No Does the patient have difficulty seeing, even when wearing glasses/contacts?: No Does the patient have difficulty concentrating, remembering, or making decisions?: No  Permission Sought/Granted                  Emotional Assessment              Admission diagnosis:  Shortness of breath [R06.02] Leg swelling [M79.89] Facial discomfort [R51.9] Acute hypoxemic respiratory failure (HCC) [J96.01] Cough, unspecified type [R05.9] Acute on chronic diastolic CHF (congestive heart failure) (HCC) [I50.33] Patient Active Problem List   Diagnosis Date Noted   Acute on chronic diastolic CHF (congestive heart failure) (HCC)  02/25/2024   Acute hypoxemic respiratory failure (HCC) 02/24/2024   Chronic respiratory failure with hypoxia (HCC) 11/18/2023   Pulmonary embolism (HCC) 01/08/2023   Diabetes (HCC) 12/16/2022   Chronic anticoagulation 12/16/2022   History of saddle pulmonary embolus (PE) 12/16/2022   Hyperlipidemia associated with type 2 diabetes mellitus (HCC) 11/08/2021   Tobacco abuse 11/08/2021   History of Fournier's gangrene 11/08/2021   Open wound of scrotum 11/08/2021   Scrotal bleeding 06/10/2021   Abscess 05/18/2021   Fournier's gangrene of scrotum 05/18/2021   Fournier gangrene 05/18/2021   Functional quadriplegia (HCC) 05/18/2021   Bacteremia due to Gram-negative bacteria 05/07/2021   Constipated 05/06/2021   Physical deconditioning 05/06/2021   Atherosclerosis of aorta (HCC) 09/28/2020   Acute on chronic heart failure (HCC) 08/02/2020   Acute saddle pulmonary embolism (HCC) 08/02/2020   Hypoxia 08/01/2020   Weakness 08/01/2020   Abnormality of gait and mobility 08/01/2020   AKI (acute kidney injury) (HCC) 07/21/2020   Hypotension 07/20/2020   Leukocytosis 07/20/2020   Cellulitis of right lower extremity 07/20/2020   Pressure injury of skin 07/12/2020   Acute on chronic respiratory failure with hypoxia and hypercapnia (HCC) 07/10/2020  COPD with acute exacerbation (HCC)    Elevated troponin    Acute on chronic diastolic (congestive) heart failure (HCC) 05/24/2020   Anasarca    Acute renal failure superimposed on stage 3a chronic kidney disease (HCC)    Shortness of breath 04/28/2020   Upper respiratory tract infection due to COVID-19 virus 04/28/2020   Edema 04/28/2020   Encounter for general adult medical examination with abnormal findings 01/15/2019   Acute non-recurrent maxillary sinusitis 01/15/2019   Gastroesophageal reflux disease without esophagitis 01/15/2019   CAD in native artery 12/16/2018   Morbid obesity with BMI of 50.0-59.9, adult (HCC) 12/16/2018   Cigarette  smoker motivated to quit 12/16/2018   Chronic pain of right knee 08/04/2018   Mild intermittent asthma 08/04/2018   Impaired fasting glucose 04/03/2018   Low back pain with right-sided sciatica 03/05/2018   Chronic right-sided low back pain with right-sided sciatica 03/05/2018   Pain in right hip 03/05/2018   Hypertension 03/05/2018   Foreign body in bladder and urethra 06/09/2013   Kidney stone 06/09/2013   Ureteric stone 06/09/2013   Kidney stone 06/09/2013   PCP:  Sallyanne Kuster, NP Pharmacy:   [facility] [address]), [place] - [address] [place]) [place] [address] Phone: [phone] Fax: [phone]  [facility]) - [place], [place] - [address] [facility] [address] [place] [address] [place] [address] Phone: [phone] Fax: [phone]  [facility] [address] [place] [address] Phone: [phone] Fax: [phone]     Social Drivers of Health (SDOH) Social History: SDOH Screenings   Food Insecurity: No Food Insecurity (02/24/2024)  Housing: Low Risk  (02/24/2024)  Transportation Needs: No Transportation Needs (02/24/2024)  Utilities: Not At Risk (02/24/2024)  Alcohol Screen: Low Risk  (10/02/2021)  Depression (PHQ2-9): Low Risk  (11/18/2023)  Physical Activity: Unknown (12/08/2018)  Social Connections: Socially Integrated (02/26/2024)  Stress: No Stress Concern Present (12/08/2018)  Tobacco Use: High Risk (02/24/2024)   SDOH Interventions:     Readmission Risk Interventions     No data to display

## 2024-02-26 NOTE — Progress Notes (Signed)
 Occupational Therapy Treatment Patient Details Name: Thomas Tanner MRN: [id number] DOB: 1951-08-30 Today's Date: 02/26/2024   History of present illness Pt is a 73 y.o. male presenting to hospital 02/24/24 with transient facial tightness; SOB and cough x1 week; B LE swelling x1 week.  Pt admitted with acute hypoxemic respiratory failure likely in setting of acute diastolic HF exacerbation.  PMH includes COPD on 4 L [place], DM, htn, HLD, HFpEF, saddle PE, htn, morbid obesity, TIA, tachycardia, and pulmonary thrombectomy 2021/2024.   OT comments  Pt is supine in bed on arrival. Pleasant and agreeable to PT/OT co-tx session to maximize pt/therapist safety d/t pt's limited endurance. He is having minimal discomfort to BLEs. Noted bleeding to LLE-notified nurse who placed bandage. Mod A x2 for bed mobility tasks with increased time. Pt reports feeling "funny headed" while seated EOB, but able to stand with Min A x2 to RW to have bed moved and him repositioned. Required increased to 5L to maintain stable 02 sats and still dropped to 83% with activity, increased to 93% with return to supine and nurse left pt on 5L. BP stable in supine. Pt with some questionable comments during session and he reports his wife sent him here d/t him "not acting right." Changed DC rec to SNF based on pt's increased physical needs. Pt returned to bed with all needs in place and will cont to require skilled acute OT services to maximize his safety and IND to return to PLOF.       If plan is discharge home, recommend the following:  A little help with walking and/or transfers;Assistance with cooking/housework;Assistance with feeding;Assist for transportation;Help with stairs or ramp for entrance;A lot of help with bathing/dressing/bathroom   Equipment Recommendations  Other (comment) (defer)    Recommendations for Other Services      Precautions / Restrictions Precautions Precautions: Fall Recall of  Precautions/Restrictions: Intact Restrictions Weight Bearing Restrictions Per Provider Order: No       Mobility Bed Mobility Overal bed mobility: Needs Assistance Bed Mobility: Supine to Sit, Sit to Supine     Supine to sit: Mod assist, +2 for physical assistance Sit to supine: Mod assist, HOB elevated, Max assist   General bed mobility comments: verb cues and assist for trunk to get to EOB; increased assist to forward scoot using chux pad; heavy assist for BLEs to return to bed; SUP for seated balance    Transfers   Equipment used: Rolling walker (2 wheels) Transfers: Sit to/from Stand Sit to Stand: Min assist, +2 physical assistance, From elevated surface           General transfer comment: elevated bed height for STS to BRW with Min A x2 and bed moved under pt as pt reported feeling "funny headed" while seated EOB     Balance Overall balance assessment: Needs assistance Sitting-balance support: Feet supported Sitting balance-Leahy Scale: Good     Standing balance support: Bilateral upper extremity supported, During functional activity, Reliant on assistive device for balance   Standing balance comment: dependent on RW but limited tolerance in standing                           ADL either performed or assessed with clinical judgement   ADL  General ADL Comments: would have required max A to don socks, but would not allow therapist to do so    Extremity/Trunk Assessment              Vision       Perception     Praxis     Communication Communication Communication: No apparent difficulties   Cognition Arousal: Alert Behavior During Therapy: [facility] for tasks assessed/performed                                 Following commands: Intact        Cueing   Cueing Techniques: Verbal cues, Gestural cues  Exercises      Shoulder Instructions       General Comments 92%  sp02 at 4L at rest; dropped to 79% with activity; placed on 5L and with rest came up to 90%, but dropped to 83% on 5L with activity; back up to 93% with return to supine with nurse present and aware    Pertinent Vitals/ Pain       Pain Assessment Pain Assessment: Faces Faces Pain Scale: Hurts a little bit Pain Location: BLEs/buttocks Pain Descriptors / Indicators: Discomfort Pain Intervention(s): Monitored during session, Repositioned  Home Living                                          Prior Functioning/Environment              Frequency  Min 2X/week        Progress Toward Goals  OT Goals(current goals can now be found in the care plan section)  Progress towards OT goals: Progressing toward goals  Acute Rehab OT Goals Patient Stated Goal: get better OT Goal Formulation: With patient Time For Goal Achievement: 03/10/24 Potential to Achieve Goals: Good  Plan      Co-evaluation    PT/OT/SLP Co-Evaluation/Treatment: Yes Reason for Co-Treatment: To address functional/ADL transfers;For patient/therapist safety PT goals addressed during session: Mobility/safety with mobility;Balance OT goals addressed during session: ADL's and self-care      AM-PAC OT "6 Clicks" Daily Activity     Outcome Measure   Help from another person eating meals?: A Little Help from another person taking care of personal grooming?: A Lot Help from another person toileting, which includes using toliet, bedpan, or urinal?: A Lot Help from another person bathing (including washing, rinsing, drying)?: A Lot Help from another person to put on and taking off regular upper body clothing?: A Little Help from another person to put on and taking off regular lower body clothing?: A Lot 6 Click Score: 14    End of Session Equipment Utilized During Treatment: Rolling walker (2 wheels);Oxygen  OT Visit Diagnosis: Unsteadiness on feet (R26.81);Muscle weakness (generalized) (M62.81)    Activity Tolerance Patient tolerated treatment well   Patient Left in bed;with call bell/phone within reach;with bed alarm set   Nurse Communication Mobility status        Time: 6295-2841 OT Time Calculation (min): 27 min  Charges: OT General Charges $OT Visit: 1 Visit OT Treatments $Therapeutic Activity: 8-22 mins  Sarya Linenberger, OTR/L  02/26/24, 2:46 PM   Thomas Tanner 02/26/2024, 2:43 PM

## 2024-02-26 NOTE — Plan of Care (Signed)

## 2024-02-26 NOTE — Plan of Care (Signed)
  Problem: Education: Goal: Knowledge of General Education information will improve Description: Including pain rating scale, medication(s)/side effects and non-pharmacologic comfort measures Outcome: Progressing   Problem: Activity: Goal: Risk for activity intolerance will decrease Outcome: Progressing   Problem: Nutrition: Goal: Adequate nutrition will be maintained Outcome: Progressing   Problem: Coping: Goal: Level of anxiety will decrease Outcome: Progressing   Problem: Pain Managment: Goal: General experience of comfort will improve and/or be controlled Outcome: Progressing   Problem: Safety: Goal: Ability to remain free from injury will improve Outcome: Progressing   Problem: Coping: Goal: Ability to adjust to condition or change in health will improve Outcome: Progressing   Problem: Skin Integrity: Goal: Risk for impaired skin integrity will decrease Outcome: Progressing

## 2024-02-26 NOTE — Hospital Course (Signed)
 Thomas Tanner is a 72 y.o. male  with medical history significant for diastolic CHF with EF 55-60%, COPD with chronic hypoxemia 4 L nasal cannula, prior history saddle PE on anticoagulation, type 2 diabetes, hypertension, dyslipidemia, prior TIA, and morbid obesity who presented to the ED with initial concerns of lower face tightness worse on the right as well as questionable bilateral facial droop.  EMS was called for evaluation of potential stroke.  However, EMS did not notice any focal deficits.  He also complained of shortness of breath, leg swelling and cough for approximately 1 week.  He recently completed a Z-Pak for upper respiratory tract infection. Patient was diagnosed with acute on chronic heart failure, was started on IV Lasix.

## 2024-02-26 NOTE — Progress Notes (Addendum)
 Progress Note   Patient: Thomas Tanner YQM:[id number] DOB: 1951-05-26 DOA: 02/24/2024     1 DOS: the patient was seen and examined on 02/26/2024   Brief hospital course: Thomas Tanner is a 73 y.o. male  with medical history significant for diastolic CHF with EF 55-60%, COPD with chronic hypoxemia 4 L nasal cannula, prior history saddle PE on anticoagulation, type 2 diabetes, hypertension, dyslipidemia, prior TIA, and morbid obesity who presented to the ED with initial concerns of lower face tightness worse on the right as well as questionable bilateral facial droop.  EMS was called for evaluation of potential stroke.  However, EMS did not notice any focal deficits.  He also complained of shortness of breath, leg swelling and cough for approximately 1 week.  He recently completed a Z-Pak for upper respiratory tract infection. Patient was diagnosed with acute on chronic heart failure, was started on IV Lasix.   Principal Problem:   Acute hypoxemic respiratory failure (HCC) Active Problems:   CAD in native artery   Acute on chronic diastolic (congestive) heart failure (HCC)   Hypertension   Morbid obesity with BMI of 50.0-59.9, adult (HCC)   Hyperlipidemia associated with type 2 diabetes mellitus (HCC)   Tobacco abuse   Atherosclerosis of aorta (HCC)   Acute on chronic diastolic CHF (congestive heart failure) (HCC)   Assessment and Plan: Acute on chronic diastolic congestive heart failure. Acute on chronic hypoxemic respite failure secondary to congestive heart failure and COPD. She was normally on 2 and half liters oxygen, she developed significant hypoxemia, she is currently on 40 oxygen.  This is secondary to combination of COPD and acute on chronic diastolic congestive heart. Patient had echocardiogram performed in 2022, ejection fraction normal, repeat echocardiogram pending. Patient still has evidence of volume overload Patient still has significant paroxysmal  nocturnal dyspnea and the back edema, will continue IV Lasix.  BNP not elevated due to morbid obesity. Wean oxygen.  COPD exacerbation. Tobacco abuse. Patient has significant spasm, he has recent upper respiratory infection, worsening cough.  Patient be treated with scheduled bronchodilator, also adding long-acting Breo. Advised to quit smoking.  Morbid obesity with BMI 55.79. Severe debility with wheelchair-bound status. Seen by PT/OT, current home with PT/OT.  History of saddle pulmonary embolism. History of TIA. Continue adequate  Type 2 diabetes. Continue sliding  Iron deficient anemia. Patient has significant iron deficiency, will give a dose of IV iron, B12 level borderline low, oral B12, will get a dose of injection.    Subjective:  Still complaining significant short of breath with exertion, paroxysmal nocturnal dyspnea.  Leg edema.  Physical Exam: Vitals:   02/25/24 1819 02/25/24 1929 02/26/24 0508 02/26/24 0822  BP:  (!) 121/103 (!) 108/44 (!) 111/55  Pulse: 76 76 80 74  Resp:  (!) 24 20 18   Temp: 97.8 F (36.6 C) 97.6 F (36.4 C) 98 F (36.7 C) 98.3 F (36.8 C)  TempSrc: Oral Oral Oral Oral  SpO2: 95% 94% 96% 93%  Weight:   (!) 191.8 kg   Height:       General exam: Appears calm and comfortable, morbid obese Respiratory system: Clear to auscultation. Respiratory effort normal. Cardiovascular system: S1 & S2 heard, RRR. No JVD, murmurs, rubs, gallops or clicks. No pedal edema. Gastrointestinal system: Abdomen is nondistended, soft and nontender. No organomegaly or masses felt. Normal bowel sounds heard. Central nervous system: Alert and oriented. No focal neurological deficits. Extremities: Symmetric 5 x 5 power. Skin: No rashes, lesions  or ulcers Psychiatry: Judgement and insight appear normal. Mood & affect appropriate.    Data Reviewed:  Review lab results.  CT results.  Family Communication: None  Disposition: Status is: Inpatient Remains  inpatient appropriate because: Severity of disease, IV treatment.     Time spent: 50 minutes  Author: Marrion Coy, MD 02/26/2024 12:20 PM  For on call review [email or web address].

## 2024-02-26 NOTE — NC FL2 (Signed)
 [place] MEDICAID FL2 LEVEL OF CARE FORM     IDENTIFICATION  Patient Name: Thomas Tanner Birthdate: 22-May-1951 Sex: male Admission Date (Current Location): 02/24/2024  [facility] and [place] Number:  Chiropodist and Address:         Provider Number: [id number]  Attending Physician Name and Address:  Marrion Coy, MD  Relative Name and Phone Number:       Current Level of Care: Hospital Recommended Level of Care: Skilled Nursing Facility Prior Approval Number:    Date Approved/Denied:   PASRR Number: [id number]  Discharge Plan: Home    Current Diagnoses: Patient Active Problem List   Diagnosis Date Noted   Acute on chronic diastolic CHF (congestive heart failure) (HCC) 02/25/2024   Acute hypoxemic respiratory failure (HCC) 02/24/2024   Chronic respiratory failure with hypoxia (HCC) 11/18/2023   Pulmonary embolism (HCC) 01/08/2023   Diabetes (HCC) 12/16/2022   Chronic anticoagulation 12/16/2022   History of saddle pulmonary embolus (PE) 12/16/2022   Hyperlipidemia associated with type 2 diabetes mellitus (HCC) 11/08/2021   Tobacco abuse 11/08/2021   History of Fournier's gangrene 11/08/2021   Open wound of scrotum 11/08/2021   Scrotal bleeding 06/10/2021   Abscess 05/18/2021   Fournier's gangrene of scrotum 05/18/2021   Fournier gangrene 05/18/2021   Functional quadriplegia (HCC) 05/18/2021   Bacteremia due to Gram-negative bacteria 05/07/2021   Constipated 05/06/2021   Physical deconditioning 05/06/2021   Atherosclerosis of aorta (HCC) 09/28/2020   Acute on chronic heart failure (HCC) 08/02/2020   Acute saddle pulmonary embolism (HCC) 08/02/2020   Hypoxia 08/01/2020   Weakness 08/01/2020   Abnormality of gait and mobility 08/01/2020   AKI (acute kidney injury) (HCC) 07/21/2020   Hypotension 07/20/2020   Leukocytosis 07/20/2020   Cellulitis of right lower extremity 07/20/2020   Pressure injury of skin 07/12/2020   Acute on chronic  respiratory failure with hypoxia and hypercapnia (HCC) 07/10/2020   COPD with acute exacerbation (HCC)    Elevated troponin    Acute on chronic diastolic (congestive) heart failure (HCC) 05/24/2020   Anasarca    Acute renal failure superimposed on stage 3a chronic kidney disease (HCC)    Shortness of breath 04/28/2020   Upper respiratory tract infection due to COVID-19 virus 04/28/2020   Edema 04/28/2020   Encounter for general adult medical examination with abnormal findings 01/15/2019   Acute non-recurrent maxillary sinusitis 01/15/2019   Gastroesophageal reflux disease without esophagitis 01/15/2019   CAD in native artery 12/16/2018   Morbid obesity with BMI of 50.0-59.9, adult (HCC) 12/16/2018   Cigarette smoker motivated to quit 12/16/2018   Chronic pain of right knee 08/04/2018   Mild intermittent asthma 08/04/2018   Impaired fasting glucose 04/03/2018   Low back pain with right-sided sciatica 03/05/2018   Chronic right-sided low back pain with right-sided sciatica 03/05/2018   Pain in right hip 03/05/2018   Hypertension 03/05/2018   Foreign body in bladder and urethra 06/09/2013   Kidney stone 06/09/2013   Ureteric stone 06/09/2013   Kidney stone 06/09/2013    Orientation RESPIRATION BLADDER Height & Weight     Self, Time, Situation, Place  Normal Continent Weight: (!) 191.8 kg Height:  6\' 1"  (185.4 cm)  BEHAVIORAL SYMPTOMS/MOOD NEUROLOGICAL BOWEL NUTRITION STATUS      Continent Diet (heart healthy carb modified)  AMBULATORY STATUS COMMUNICATION OF NEEDS Skin   Extensive Assist Verbally Skin abrasions  Personal Care Assistance Level of Assistance              Functional Limitations Info             SPECIAL CARE FACTORS FREQUENCY  PT (By licensed PT), OT (By licensed OT)                    Contractures Contractures Info: Not present    Additional Factors Info  Code Status, Allergies Code Status Info: Full Allergies  Info: Morphine, Azithromycin, Morphine And Codeine           Current Medications (02/26/2024):  This is the current hospital active medication list Current Facility-Administered Medications  Medication Dose Route Frequency Provider Last Rate Last Admin   acetaminophen (TYLENOL) tablet 650 mg  650 mg Oral Q4H PRN Sherryll Burger, Pratik D, DO       albuterol (PROVENTIL) (2.5 MG/3ML) 0.083% nebulizer solution 3 mL  3 mL Inhalation Q4H PRN Marrion Coy, MD       ALPRAZolam Prudy Feeler) tablet 0.25 mg  0.25 mg Oral Daily PRN Sherryll Burger, Pratik D, DO       apixaban (ELIQUIS) tablet 5 mg  5 mg Oral BID Sherryll Burger, Pratik D, DO   5 mg at 02/26/24 9562   cyanocobalamin (VITAMIN B12) tablet 1,000 mcg  1,000 mcg Oral Daily Sherryll Burger, Pratik D, DO   1,000 mcg at 02/26/24 0844   famotidine (PEPCID) tablet 40 mg  40 mg Oral Daily Sherryll Burger, Pratik D, DO   40 mg at 02/26/24 0843   fluticasone furoate-vilanterol (BREO ELLIPTA) 200-25 MCG/ACT 1 puff  1 puff Inhalation Daily Marrion Coy, MD   1 puff at 02/26/24 1100   furosemide (LASIX) injection 40 mg  40 mg Intravenous Q12H Shah, Pratik D, DO   40 mg at 02/26/24 1100   guaiFENesin (ROBITUSSIN) 100 MG/5ML liquid 5 mL  5 mL Oral Q4H PRN Marrion Coy, MD   5 mL at 02/26/24 0843   HYDROcodone-acetaminophen (NORCO/VICODIN) 5-325 MG per tablet 1 tablet  1 tablet Oral Q4H PRN Sherryll Burger, Pratik D, DO       hydrOXYzine (ATARAX) tablet 25 mg  25 mg Oral TID PRN Sherryll Burger, Pratik D, DO       insulin aspart (novoLOG) injection 0-15 Units  0-15 Units Subcutaneous TID WC Marrion Coy, MD       ipratropium-albuterol (DUONEB) 0.5-2.5 (3) MG/3ML nebulizer solution 3 mL  3 mL Nebulization TID Marrion Coy, MD       iron polysaccharides (NIFEREX) capsule 150 mg  150 mg Oral Daily Sherryll Burger, Pratik D, DO   150 mg at 02/26/24 0844   iron sucrose (VENOFER) 300 mg in sodium chloride 0.9 % 250 mL IVPB  300 mg Intravenous Once Marrion Coy, MD 176.7 mL/hr at 02/26/24 1454 300 mg at 02/26/24 1454   loratadine (CLARITIN) tablet 10 mg   10 mg Oral Daily Sherryll Burger, Pratik D, DO   10 mg at 02/26/24 0844   metoprolol succinate (TOPROL-XL) 24 hr tablet 50 mg  50 mg Oral Daily Sherryll Burger, Pratik D, DO   50 mg at 02/26/24 0844   ondansetron (ZOFRAN) injection 4 mg  4 mg Intravenous Q6H PRN Sherryll Burger, Pratik D, DO       pantoprazole (PROTONIX) EC tablet 20 mg  20 mg Oral Q breakfast Sherryll Burger, Pratik D, DO   20 mg at 02/26/24 0844   phenylephrine-shark liver oil-mineral oil-petrolatum (PREPARATION H) rectal ointment 1 Application  1 Application Rectal BID PRN Erick Blinks,  DO       rosuvastatin (CRESTOR) tablet 20 mg  20 mg Oral Daily Sherryll Burger, Pratik D, DO   20 mg at 02/26/24 1324   senna-docusate (Senokot-S) tablet 1 tablet  1 tablet Oral BID Maurilio Lovely D, DO   1 tablet at 02/26/24 0844   sodium chloride flush (NS) 0.9 % injection 3 mL  3 mL Intravenous Q12H Shah, Pratik D, DO   3 mL at 02/26/24 0847   sodium chloride flush (NS) 0.9 % injection 3 mL  3 mL Intravenous PRN Maurilio Lovely D, DO         Discharge Medications: Please see discharge summary for a list of discharge medications.  Relevant Imaging Results:  Relevant Lab Results:   Additional Information SS#: [id number].  Chapman Fitch, RN

## 2024-02-26 NOTE — Progress Notes (Signed)
 Physical Therapy Treatment Patient Details Name: Thomas Tanner MRN: [id number] DOB: 04-13-1951 Today's Date: 02/26/2024   History of Present Illness Pt is a 73 y.o. male presenting to hospital 02/24/24 with transient facial tightness; SOB and cough x1 week; B LE swelling x1 week.  Pt admitted with acute hypoxemic respiratory failure likely in setting of acute diastolic HF exacerbation.  PMH includes COPD on 4 L [place], DM, htn, HLD, HFpEF, saddle PE, htn, morbid obesity, TIA, tachycardia, and pulmonary thrombectomy 2021/2024.    PT Comments  Patient received in bed, initially reporting he was worn out. With encouragement, patient is agreeable to PT/OT session. Patient requires mod A +2 with bed mobility. Min A +2 for sit to stand. Limited tolerance and unable to take steps this session. He requested bed be pulled down rather than attempting to scoot or take steps. He will continue to benefit from skilled PT to improve strength, endurance and independence.     If plan is discharge home, recommend the following: Assistance with cooking/housework;Assist for transportation;Help with stairs or ramp for entrance;Two people to help with walking and/or transfers;A lot of help with bathing/dressing/bathroom   Can travel by private vehicle     No  Equipment Recommendations  None recommended by PT    Recommendations for Other Services       Precautions / Restrictions Precautions Precautions: Fall Recall of Precautions/Restrictions: Intact Restrictions Weight Bearing Restrictions Per Provider Order: No     Mobility  Bed Mobility Overal bed mobility: Needs Assistance Bed Mobility: Supine to Sit, Sit to Supine     Supine to sit: Mod assist, +2 for physical assistance Sit to supine: Mod assist, HOB elevated, Max assist   General bed mobility comments: heavy assist for B LE's; assist for trunk semi-supine to sitting; heavy use of bed rails    Transfers Overall transfer level: Needs  assistance Equipment used: Rolling walker (2 wheels) Transfers: Sit to/from Stand Sit to Stand: Min assist, +2 physical assistance, From elevated surface           General transfer comment: Elevated bed height and extra time to complete    Ambulation/Gait               General Gait Details: unable, unwilling to attempt taking steps. Asked that we pull bed down when he is standing.   Stairs             Wheelchair Mobility     Tilt Bed    Modified Rankin (Stroke Patients Only)       Balance Overall balance assessment: Needs assistance Sitting-balance support: Feet supported Sitting balance-Leahy Scale: Good     Standing balance support: Bilateral upper extremity supported, During functional activity, Reliant on assistive device for balance Standing balance-Leahy Scale: Fair Standing balance comment: Static standing, heavy reliant on RW, limited tolerance with standing                            Communication Communication Communication: No apparent difficulties  Cognition Arousal: Alert Behavior During Therapy: WFL for tasks assessed/performed   PT - Cognitive impairments: Difficult to assess                       PT - Cognition Comments: patient hyper verbal Following commands: Intact      Cueing Cueing Techniques: Verbal cues, Gestural cues  Exercises      General Comments  Pertinent Vitals/Pain Pain Assessment Pain Assessment: Faces Faces Pain Scale: Hurts a little bit Pain Location: B Les. Pain Descriptors / Indicators: Discomfort Pain Intervention(s): Monitored during session, Repositioned    Home Living                          Prior Function            PT Goals (current goals can now be found in the care plan section) Acute Rehab PT Goals Patient Stated Goal: to improve strength and breathing PT Goal Formulation: With patient Time For Goal Achievement: 03/10/24 Potential to Achieve  Goals: Fair Progress towards PT goals: Not progressing toward goals - comment (did not progress this session due to fatigue)    Frequency    Min 2X/week      PT Plan      Co-evaluation PT/OT/SLP Co-Evaluation/Treatment: Yes Reason for Co-Treatment: To address functional/ADL transfers;For patient/therapist safety PT goals addressed during session: Mobility/safety with mobility;Balance        AM-PAC PT "6 Clicks" Mobility   Outcome Measure  Help needed turning from your back to your side while in a flat bed without using bedrails?: A Lot Help needed moving from lying on your back to sitting on the side of a flat bed without using bedrails?: A Lot Help needed moving to and from a bed to a chair (including a wheelchair)?: A Lot Help needed standing up from a chair using your arms (e.g., wheelchair or bedside chair)?: A Lot Help needed to walk in hospital room?: Total Help needed climbing 3-5 steps with a railing? : Total 6 Click Score: 10    End of Session Equipment Utilized During Treatment: Oxygen Activity Tolerance: Patient limited by fatigue Patient left: in bed;with call bell/phone within reach;with bed alarm set;Other (comment) Nurse Communication: Mobility status;Other (comment) (O2 needs, wound on L LE & leg weeping.) PT Visit Diagnosis: Other abnormalities of gait and mobility (R26.89);Muscle weakness (generalized) (M62.81)     Time: 1610-9604 PT Time Calculation (min) (ACUTE ONLY): 27 min  Charges:    $Therapeutic Activity: 8-22 mins PT General Charges $$ ACUTE PT VISIT: 1 Visit                     Chandler Stofer, PT, GCS 02/26/24,2:33 PM

## 2024-02-27 DIAGNOSIS — I5033 Acute on chronic diastolic (congestive) heart failure: Secondary | ICD-10-CM | POA: Diagnosis not present

## 2024-02-27 DIAGNOSIS — J9601 Acute respiratory failure with hypoxia: Secondary | ICD-10-CM | POA: Diagnosis not present

## 2024-02-27 DIAGNOSIS — J441 Chronic obstructive pulmonary disease with (acute) exacerbation: Secondary | ICD-10-CM

## 2024-02-27 LAB — BASIC METABOLIC PANEL
Anion gap: 6 (ref 5–15)
BUN: 18 mg/dL (ref 8–23)
CO2: 44 mmol/L — ABNORMAL HIGH (ref 22–32)
Calcium: 7.8 mg/dL — ABNORMAL LOW (ref 8.9–10.3)
Chloride: 90 mmol/L — ABNORMAL LOW (ref 98–111)
Creatinine, Ser: 1.08 mg/dL (ref 0.61–1.24)
GFR, Estimated: >60 mL/min (ref >60)
Glucose, Bld: 105 mg/dL — ABNORMAL HIGH (ref 70–99)
Potassium: 5.1 mmol/L (ref 3.5–5.1)
Sodium: 140 mmol/L (ref 135–145)

## 2024-02-27 LAB — MAGNESIUM: Magnesium: 2.2 mg/dL (ref 1.7–2.4)

## 2024-02-27 LAB — GLUCOSE, CAPILLARY
Glucose-Capillary: 98 mg/dL (ref 70–99)
Glucose-Capillary: 99 mg/dL (ref 70–99)
Glucose-Capillary: 94 mg/dL (ref 70–99)
Glucose-Capillary: 107 mg/dL — ABNORMAL HIGH (ref 70–99)

## 2024-02-27 LAB — HOMOCYSTEINE: Homocysteine: 14.5 umol/L (ref 0.0–19.2)

## 2024-02-27 MED ORDER — NICOTINE 21 MG/24HR TD PT24
21.0000 mg | MEDICATED_PATCH | Freq: Every day | TRANSDERMAL | Status: DC
  Administered 2024-02-27 – 2024-02-29 (×3): 21 mg via TRANSDERMAL
  Filled 2024-02-27 (×4): qty 1

## 2024-02-27 MED ORDER — IPRATROPIUM-ALBUTEROL 0.5-2.5 (3) MG/3ML IN SOLN
3.0000 mL | Freq: Two times a day (BID) | RESPIRATORY_TRACT | Status: DC
  Administered 2024-02-27 – 2024-02-29 (×4): 3 mL via RESPIRATORY_TRACT
  Filled 2024-02-27 (×4): qty 3

## 2024-02-27 MED ORDER — HALOPERIDOL LACTATE 5 MG/ML IJ SOLN: 1.0000 mg | Freq: Four times a day (QID) | INTRAMUSCULAR | Status: DC | PRN

## 2024-02-27 NOTE — Plan of Care (Signed)

## 2024-02-27 NOTE — Progress Notes (Addendum)
 Progress Note   Patient: Thomas Tanner ZOX:[id number] DOB: [birth year] DOA: 02/24/2024     2 DOS: the patient was seen and examined on 02/27/2024   Brief hospital course: Thomas Tanner is a 73 y.o. male  with medical history significant for diastolic CHF with EF 55-60%, COPD with chronic hypoxemia 4 L nasal cannula, prior history saddle PE on anticoagulation, type 2 diabetes, hypertension, dyslipidemia, prior TIA, and morbid obesity who presented to the ED with initial concerns of lower face tightness worse on the right as well as questionable bilateral facial droop.  EMS was called for evaluation of potential stroke.  However, EMS did not notice any focal deficits.  He also complained of shortness of breath, leg swelling and cough for approximately 1 week.  He recently completed a Z-Pak for upper respiratory tract infection. Patient was diagnosed with acute on chronic heart failure, was started on IV Lasix.   Principal Problem:   Acute hypoxemic respiratory failure (HCC) Active Problems:   CAD in native artery   Acute on chronic diastolic (congestive) heart failure (HCC)   Hypertension   COPD with acute exacerbation (HCC)   Morbid obesity with BMI of 50.0-59.9, adult (HCC)   Hyperlipidemia associated with type 2 diabetes mellitus (HCC)   Tobacco abuse   Atherosclerosis of aorta (HCC)   Acute on chronic diastolic CHF (congestive heart failure) (HCC)   Assessment and Plan:  Acute on chronic diastolic congestive heart failure. Acute on chronic hypoxemic respite failure secondary to congestive heart failure and COPD. He was normally on 3.5 liters oxygen, he developed significant hypoxemia, she is currently on 4L oxygen.  This is secondary to combination of COPD and acute on chronic diastolic congestive heart. Patient had echocardiogram performed in 2022, ejection fraction normal, repeat echocardiogram EF 50 to 55%, diastolic parameters are indeterminate. Patient still  has evidence of volume overload Patient still has significant paroxysmal nocturnal dyspnea and the back edema, will continue IV Lasix.  BNP not elevated due to morbid obesity. Condition improving, currently on 4 L oxygen, close to baseline. Patient still has volume overload, will continue IV Lasix.  Function still stable.   COPD exacerbation. Tobacco abuse. Patient had significant bronchospasm, he has recent upper respiratory infection, worsening cough.  Patient be treated with scheduled bronchodilator, also adding long-acting Breo. Advised to quit smoking. Bronchospasm much better today.   Morbid obesity with BMI 55.79. More than class 3 obesity. Severe debility with wheelchair-bound status. Seen by PT/OT, current home with PT/OT.   History of saddle pulmonary embolism. History of TIA. Continue adequate   Type 2 diabetes. Continue sliding   Iron deficient anemia. Patient has significant iron deficiency, will give a dose of IV iron, B12 level borderline low, oral B12, will get a dose of injection.     Subjective:  Patient short of breath is better today.  Cough is also better.  No wheezing.  Physical Exam: Vitals:   02/26/24 1959 02/27/24 0348 02/27/24 0500 02/27/24 0757  BP: (!) 121/55 (!) 119/56  (!) 152/66  Pulse: 66   78  Resp: 18 20  19   Temp: 98.4 F (36.9 C) 98.1 F (36.7 C)  98.3 F (36.8 C)  TempSrc: Oral Oral    SpO2: 95% 91%  95%  Weight:   (!) 188 kg   Height:       General exam: Appears calm and comfortable, morbid obese. Respiratory system: Decreased breath sounds without wheezes.Marland Kitchen Respiratory effort normal. Cardiovascular system: S1 & S2 heard,  RRR. No JVD, murmurs, rubs, gallops or clicks.  Gastrointestinal system: Abdomen is nondistended, soft and nontender. No organomegaly or masses felt. Normal bowel sounds heard. Central nervous system: Alert and oriented. No focal neurological deficits. Extremities: 2+ leg edema. Skin: No rashes, lesions or  ulcers Psychiatry: Judgement and insight appear normal. Mood & affect appropriate.    Data Reviewed:  Lab results reviewed.  Family Communication: None  Disposition: Status is: Inpatient Remains inpatient appropriate because: Severity of disease, IV treatment.     Time spent: 35 minutes  Author: Marrion Coy, MD 02/27/2024 12:19 PM  For on call review [email or web address].

## 2024-02-27 NOTE — TOC Progression Note (Signed)
 Transition of Care [facility]) - Progression Note    Patient Details  Name: Thomas Tanner MRN: [id number] Date of Birth: 1951/12/01  [facility] CM/SW Contact  Chapman Fitch, RN Phone Number: 02/27/2024, 1:51 PM  Clinical Narrative:        Met with patient at bedside.  Bed offer presented Patient accepts bed at [facility]  Accepted in [place], notified Kenney Houseman at [facility] Per MD patient not medically ready for discharge, will follow up with MD tomorrow about starting auth Per patient request call placed to family to update.  Number for wife was not a working number, VM left for daughter     Expected Discharge Plan and Services                                               Social Determinants of Health (SDOH) Interventions SDOH Screenings   Food Insecurity: No Food Insecurity (02/24/2024)  Housing: Low Risk  (02/24/2024)  Transportation Needs: No Transportation Needs (02/24/2024)  Utilities: Not At Risk (02/24/2024)  Alcohol Screen: Low Risk  (10/02/2021)  Depression (PHQ2-9): Low Risk  (11/18/2023)  Physical Activity: Unknown (12/08/2018)  Social Connections: Socially Integrated (02/26/2024)  Stress: No Stress Concern Present (12/08/2018)  Tobacco Use: High Risk (02/24/2024)    Readmission Risk Interventions     No data to display

## 2024-02-28 DIAGNOSIS — J441 Chronic obstructive pulmonary disease with (acute) exacerbation: Secondary | ICD-10-CM | POA: Diagnosis not present

## 2024-02-28 DIAGNOSIS — J9601 Acute respiratory failure with hypoxia: Secondary | ICD-10-CM | POA: Diagnosis not present

## 2024-02-28 DIAGNOSIS — I5033 Acute on chronic diastolic (congestive) heart failure: Secondary | ICD-10-CM | POA: Diagnosis not present

## 2024-02-28 LAB — BASIC METABOLIC PANEL
Anion gap: 12 (ref 5–15)
BUN: 17 mg/dL (ref 8–23)
BUN: 17 mg/dL (ref 8–23)
CO2: >45 mmol/L — ABNORMAL HIGH (ref 22–32)
CO2: 42 mmol/L — ABNORMAL HIGH (ref 22–32)
Calcium: 7.7 mg/dL — ABNORMAL LOW (ref 8.9–10.3)
Calcium: 7.9 mg/dL — ABNORMAL LOW (ref 8.9–10.3)
Chloride: 89 mmol/L — ABNORMAL LOW (ref 98–111)
Chloride: 87 mmol/L — ABNORMAL LOW (ref 98–111)
Creatinine, Ser: 1.06 mg/dL (ref 0.61–1.24)
Creatinine, Ser: 1.07 mg/dL (ref 0.61–1.24)
GFR, Estimated: >60 mL/min (ref >60)
GFR, Estimated: >60 mL/min (ref >60)
Glucose, Bld: 94 mg/dL (ref 70–99)
Glucose, Bld: 98 mg/dL (ref 70–99)
Potassium: 6.9 mmol/L (ref 3.5–5.1)
Potassium: 3.7 mmol/L (ref 3.5–5.1)
Sodium: 139 mmol/L (ref 135–145)
Sodium: 141 mmol/L (ref 135–145)

## 2024-02-28 LAB — CBC
HCT: 31.9 % — ABNORMAL LOW (ref 39.0–52.0)
Hemoglobin: 8.5 g/dL — ABNORMAL LOW (ref 13.0–17.0)
MCH: 22.7 pg — ABNORMAL LOW (ref 26.0–34.0)
MCHC: 26.6 g/dL — ABNORMAL LOW (ref 30.0–36.0)
MCV: 85.3 fL (ref 80.0–100.0)
Platelets: 188 10*3/uL (ref 150–400)
RBC: 3.74 MIL/uL — ABNORMAL LOW (ref 4.22–5.81)
RDW: 19.8 % — ABNORMAL HIGH (ref 11.5–15.5)
WBC: 6.6 10*3/uL (ref 4.0–10.5)
nRBC: 0 % (ref 0.0–0.2)

## 2024-02-28 LAB — MAGNESIUM: Magnesium: 2.2 mg/dL (ref 1.7–2.4)

## 2024-02-28 LAB — GLUCOSE, CAPILLARY
Glucose-Capillary: 90 mg/dL (ref 70–99)
Glucose-Capillary: 121 mg/dL — ABNORMAL HIGH (ref 70–99)
Glucose-Capillary: 127 mg/dL — ABNORMAL HIGH (ref 70–99)
Glucose-Capillary: 115 mg/dL — ABNORMAL HIGH (ref 70–99)

## 2024-02-28 NOTE — Care Management Important Message (Signed)
 Important Message  Patient Details  Name: Thomas Tanner MRN: [id number] Date of Birth: 1951-09-26   Important Message Given:  Yes - Medicare IM     Cristela Blue, CMA 02/28/2024, 11:28 AM

## 2024-02-28 NOTE — Plan of Care (Signed)

## 2024-02-28 NOTE — TOC Progression Note (Signed)
 Transition of Care [facility]) - Progression Note    Patient Details  Name: Thomas Tanner MRN: [id number] Date of Birth: 1951/08/06  Transition [facility] Care [facility]) CM/SW Contact  Chapman Fitch, RN Phone Number: 02/28/2024, 1:59 PM  Clinical Narrative:     pt is Approved. Plan Auth ID: [id number] dates 3/21-3/25/2025. next review date: 03/03/2024  Per MD DC tomorrow, Kenney Houseman at [facility] notified        Expected Discharge Plan and Services                                               Social Determinants of Health (SDOH) Interventions SDOH Screenings   Food Insecurity: No Food Insecurity (02/24/2024)  Housing: Low Risk  (02/24/2024)  Transportation Needs: No Transportation Needs (02/24/2024)  Utilities: Not At Risk (02/24/2024)  Alcohol Screen: Low Risk  (10/02/2021)  Depression (PHQ2-9): Low Risk  (11/18/2023)  Physical Activity: Unknown (12/08/2018)  Social Connections: Socially Integrated (02/26/2024)  Stress: No Stress Concern Present (12/08/2018)  Tobacco Use: High Risk (02/24/2024)    Readmission Risk Interventions     No data to display

## 2024-02-28 NOTE — Progress Notes (Signed)
 Occupational Therapy Treatment Patient Details Name: Thomas Tanner MRN: [id number] DOB: 09-16-1951 Today's Date: 02/28/2024   History of present illness Pt is a 73 y.o. male presenting to hospital 02/24/24 with transient facial tightness; SOB and cough x1 week; B LE swelling x1 week.  Pt admitted with acute hypoxemic respiratory failure likely in setting of acute diastolic HF exacerbation.  PMH includes COPD on 4 L [place], DM, htn, HLD, HFpEF, saddle PE, htn, morbid obesity, TIA, tachycardia, and pulmonary thrombectomy 2021/2024.   OT comments  Pt seen for OT/PT co-tx this date to maximize functional outcomes and for pt / therapist safety. Pt requires max cuing to participate in session. Found to have spilled coffee over bed linens and gown, requires maxA to don socks bed level and minA to doff/don new gown. Required increase from 3L to 4L O2 via [place] for SpO2 to rise above 90%. ModA +2 for bed mobility, requires heavy assist for BLE and trunk. Pt refusing physical assist from therapy team during transfers, was able to stand from elevated bed using RW with supervision but did require intermittent CGA while standing to correct forward lean. Pt continues to demo global weakness, poor activity tolerance and decreased balance. Limited LB ADL performance due to body habitus/decreased ROM. Discharge recommendation updated, OT will continue to progress as able.       If plan is discharge home, recommend the following:  A little help with walking and/or transfers;Assistance with cooking/housework;Assistance with feeding;Assist for transportation;Help with stairs or ramp for entrance;A lot of help with bathing/dressing/bathroom   Equipment Recommendations  Other (comment)       Precautions / Restrictions Precautions Precautions: Fall Recall of Precautions/Restrictions: Intact Restrictions Weight Bearing Restrictions Per Provider Order: No       Mobility Bed Mobility Overal bed mobility: Needs  Assistance Bed Mobility: Supine to Sit, Sit to Supine     Supine to sit: Mod assist, +2 for physical assistance Sit to supine: Mod assist, HOB elevated, Max assist   General bed mobility comments: verbal cues, pt irritable with therapist's cues and attempts to put bed in optimal position, assist for BLE and trunk during both transitions. Once seated, SUP for balance    Transfers Overall transfer level: Needs assistance Equipment used: Rolling walker (2 wheels) Transfers: Sit to/from Stand Sit to Stand: Contact guard assist, +2 physical assistance, +2 safety/equipment, From elevated surface           General transfer comment: elevated bed height, pt occassionally requires CGA, pt would not let therapists provide much assist     Balance Overall balance assessment: Needs assistance Sitting-balance support: Feet supported Sitting balance-Leahy Scale: Good     Standing balance support: Bilateral upper extremity supported, During functional activity, Reliant on assistive device for balance Standing balance-Leahy Scale: Fair Standing balance comment: dependent on RW with UE support, stands with forward flexed posture                           ADL either performed or assessed with clinical judgement   ADL Overall ADL's : Needs assistance/impaired                 Upper Body Dressing : Minimal assistance;Bed level Upper Body Dressing Details (indicate cue type and reason): minA to doff/donn new gown Lower Body Dressing: Maximal assistance;Bed level Lower Body Dressing Details (indicate cue type and reason): maxA to don socks  Functional mobility during ADLs: Contact guard assist;Rolling walker (2 wheels);+2 for safety/equipment       Communication Communication Communication: No apparent difficulties   Cognition Arousal: Alert Behavior During Therapy: WFL for tasks assessed/performed Cognition: No family/caregiver present to determine  baseline                               Following commands: Intact        Cueing   Cueing Techniques: Verbal cues, Gestural cues  Exercises Other Exercises Other Exercises: discussed UE exercises to perform bed level    Shoulder Instructions       General Comments Recieved on 3L with O2 sats 86%. Did not recover to 90%> with PLB, increased to 4L for mobility, sats dropping to high 80s on 4L. Left on 4L with SpO2 90%. RN notified    Pertinent Vitals/ Pain       Pain Assessment Pain Assessment: 0-10 Pain Score: 8  Pain Descriptors / Indicators: Discomfort Pain Intervention(s): Limited activity within patient's tolerance         Frequency  Min 2X/week        Progress Toward Goals  OT Goals(current goals can now be found in the care plan section)  Progress towards OT goals: Progressing toward goals  Acute Rehab OT Goals OT Goal Formulation: With patient Time For Goal Achievement: 03/10/24 Potential to Achieve Goals: Good ADL Goals Pt Will Perform Grooming: with min assist;sitting Pt Will Perform Lower Body Dressing: with max assist Pt Will Transfer to Toilet: stand pivot transfer;bedside commode;with modified independence Pt Will Perform Toileting - Clothing Manipulation and hygiene: with max assist;sit to/from stand  Plan      Co-evaluation    PT/OT/SLP Co-Evaluation/Treatment: Yes Reason for Co-Treatment: To address functional/ADL transfers;For patient/therapist safety PT goals addressed during session: Mobility/safety with mobility;Balance OT goals addressed during session: ADL's and self-care      AM-PAC OT "6 Clicks" Daily Activity     Outcome Measure   Help from another person eating meals?: A Little Help from another person taking care of personal grooming?: A Lot Help from another person toileting, which includes using toliet, bedpan, or urinal?: A Lot Help from another person bathing (including washing, rinsing, drying)?: A  Lot Help from another person to put on and taking off regular upper body clothing?: A Little Help from another person to put on and taking off regular lower body clothing?: A Lot 6 Click Score: 14    End of Session Equipment Utilized During Treatment: Rolling walker (2 wheels);Oxygen  OT Visit Diagnosis: Unsteadiness on feet (R26.81);Muscle weakness (generalized) (M62.81)   Activity Tolerance Patient tolerated treatment well   Patient Left in bed;with call bell/phone within reach;with bed alarm set   Nurse Communication Mobility status        Time: 2952-8413 OT Time Calculation (min): 25 min  Charges: OT General Charges $OT Visit: 1 Visit OT Treatments $Self Care/Home Management : 8-22 mins  Lorrane Mccay L. Darrie Macmillan, OTR/L  02/28/24, 1:28 PM

## 2024-02-28 NOTE — Plan of Care (Signed)
  Problem: Education: Goal: Knowledge of General Education information will improve Description: Including pain rating scale, medication(s)/side effects and non-pharmacologic comfort measures Outcome: Progressing   Problem: Clinical Measurements: Goal: Will remain free from infection Outcome: Progressing   Problem: Nutrition: Goal: Adequate nutrition will be maintained Outcome: Progressing   

## 2024-02-28 NOTE — Progress Notes (Signed)
 Progress Note   Patient: Thomas Tanner VHQ:[id number] DOB: 1951-06-04 DOA: 02/24/2024     3 DOS: the patient was seen and examined on 02/28/2024   Brief hospital course: Jahlon Baines is a 73 y.o. male  with medical history significant for diastolic CHF with EF 55-60%, COPD with chronic hypoxemia 4 L nasal cannula, prior history saddle PE on anticoagulation, type 2 diabetes, hypertension, dyslipidemia, prior TIA, and morbid obesity who presented to the ED with initial concerns of lower face tightness worse on the right as well as questionable bilateral facial droop.  EMS was called for evaluation of potential stroke.  However, EMS did not notice any focal deficits.  He also complained of shortness of breath, leg swelling and cough for approximately 1 week.  He recently completed a Z-Pak for upper respiratory tract infection. Patient was diagnosed with acute on chronic heart failure, was started on IV Lasix.   Principal Problem:   Acute hypoxemic respiratory failure (HCC) Active Problems:   CAD in native artery   Acute on chronic diastolic (congestive) heart failure (HCC)   Hypertension   COPD with acute exacerbation (HCC)   Morbid obesity with BMI of 50.0-59.9, adult (HCC)   Hyperlipidemia associated with type 2 diabetes mellitus (HCC)   Tobacco abuse   Atherosclerosis of aorta (HCC)   Acute on chronic diastolic CHF (congestive heart failure) (HCC)   Acute on chronic diastolic congestive heart failure. Acute on chronic hypoxemic respite failure secondary to congestive heart failure and COPD. He was normally on 3.5 liters oxygen, he developed significant hypoxemia, she is currently on 4L oxygen.  This is secondary to combination of COPD and acute on chronic diastolic congestive heart. Patient had echocardiogram performed in 2022, ejection fraction normal, repeat echocardiogram EF 50 to 55%, diastolic parameters are indeterminate. Patient still has evidence of volume  overload Patient still has significant paroxysmal nocturnal dyspnea and the back edema, will continue IV Lasix.  BNP not elevated due to morbid obesity. Patient condition gradually improving, will benefit from another day of IV Lasix.  May be able to transfer to nursing home tomorrow.   COPD exacerbation. Tobacco abuse. Patient had significant bronchospasm, he has recent upper respiratory infection, worsening cough.  Patient be treated with scheduled bronchodilator, also adding long-acting Breo. Advised to quit smoking. Condition much improved   Morbid obesity with BMI 55.79. More than class 3 obesity. Severe debility with wheelchair-bound status. Seen by PT/OT, now recommend nursing home placement.   History of saddle pulmonary embolism. History of TIA. Continue Eliquis.   Type 2 diabetes. Continue sliding scale insulin.   Iron deficient anemia. Patient has significant iron deficiency, received a dose of IV iron, B12 level borderline low, oral B12, also received a dose of injected B12. Recheck CBC tomorrow.        Subjective:  Patient is a much better today, short of breath better.  No cough.  Physical Exam: Vitals:   02/28/24 0312 02/28/24 0413 02/28/24 0728 02/28/24 0911  BP: (!) 122/55  108/62 108/62  Pulse: 75  77 77  Resp: 20  20   Temp: 97.9 F (36.6 C)  98.6 F (37 C)   TempSrc:   Oral   SpO2: 96%  92%   Weight:  (!) 177.6 kg    Height:       General exam: Appears calm and comfortable, morbidly obese. Respiratory system: Clear to auscultation. Respiratory effort normal. Cardiovascular system: S1 & S2 heard, RRR. No JVD, murmurs, rubs, gallops  or clicks. Gastrointestinal system: Abdomen is nondistended, soft and nontender. No organomegaly or masses felt. Normal bowel sounds heard. Central nervous system: Alert and oriented. No focal neurological deficits. Extremities: Leg edema much improved. Skin: No rashes, lesions or ulcers Psychiatry:  Mood & affect  appropriate.    Data Reviewed:  Lab results reviewed.  Family Communication: not able to reach daughter or wife  Disposition: Status is: Inpatient Remains inpatient appropriate because: severity of disease, IV treatment.     Time spent: 35 minutes  Author: Marrion Coy, MD 02/28/2024 12:54 PM  For on call review [email or web address].

## 2024-02-28 NOTE — TOC Progression Note (Signed)
 Transition of Care [facility]) - Progression Note    Patient Details  Name: Thomas Tanner MRN: [id number] Date of Birth: 27-Oct-1951  [facility]) CM/SW Contact  Chapman Fitch, RN Phone Number: 02/28/2024, 9:54 AM  Clinical Narrative:     Per MD patient ready for auth to be initiated for [facility]  Requested Nitchia with [facility] to start auth       Expected Discharge Plan and Services                                               Social Determinants of Health (SDOH) Interventions SDOH Screenings   Food Insecurity: No Food Insecurity (02/24/2024)  Housing: Low Risk  (02/24/2024)  Transportation Needs: No Transportation Needs (02/24/2024)  Utilities: Not At Risk (02/24/2024)  Alcohol Screen: Low Risk  (10/02/2021)  Depression (PHQ2-9): Low Risk  (11/18/2023)  Physical Activity: Unknown (12/08/2018)  Social Connections: Socially Integrated (02/26/2024)  Stress: No Stress Concern Present (12/08/2018)  Tobacco Use: High Risk (02/24/2024)    Readmission Risk Interventions     No data to display

## 2024-02-28 NOTE — Progress Notes (Signed)
 Physical Therapy Treatment Patient Details Name: Thomas Tanner MRN: [id number] DOB: 1951/04/18 Today's Date: 02/28/2024   History of Present Illness Pt is a 73 y.o. male presenting to hospital 02/24/24 with transient facial tightness; SOB and cough x1 week; B LE swelling x1 week.  Pt admitted with acute hypoxemic respiratory failure likely in setting of acute diastolic HF exacerbation.  PMH includes COPD on 4 L [place], DM, htn, HLD, HFpEF, saddle PE, htn, morbid obesity, TIA, tachycardia, and pulmonary thrombectomy 2021/2024.    PT Comments  Patient received in bed with O2 at 3L  agreeable to participate in PT/OT Co-Treat after encouragement. SPO2 85-87 at rest. Max of 2 for Supine ot sit and sit to supine,. Sat on EOB x 5 mins with good balance. SPO2 dropped to 80 at 3L in sitting so incerased the flow to 4L. Pt maintained 90 % SPO2 during session at 4L.STS using FWW x 3 reps with CGA to Sup x 1 fro 37 secs, 24 secs and 15 secs with decreased postural alignment. Pt squatted on the spot. Pt became comfortable in bed with bed position to assist with positioning. Pt left in bed with all needs within reach. O2 at 4L to maintain SPO2 >90.  Nurse made aware of it.    If plan is discharge home, recommend the following: Assistance with cooking/housework;Assist for transportation;Help with stairs or ramp for entrance;Two people to help with walking and/or transfers;A lot of help with bathing/dressing/bathroom   Can travel by private vehicle     No  Equipment Recommendations  None recommended by PT    Recommendations for Other Services       Precautions / Restrictions Precautions Precautions: Fall Recall of Precautions/Restrictions: Intact Restrictions Weight Bearing Restrictions Per Provider Order: No     Mobility  Bed Mobility Overal bed mobility: Needs Assistance Bed Mobility: Supine to Sit, Sit to Supine     Supine to sit: Mod assist Sit to supine: Max assist   General bed  mobility comments: verbal cues, pt irritable with therapist's cues and attempts to put bed in optimal position, assist for BLE and trunk during both transitions. Once seated, SUP for balance    Transfers Overall transfer level: Needs assistance Equipment used: Rolling walker (2 wheels) Transfers: Sit to/from Stand Sit to Stand: Supervision, Contact guard assist           General transfer comment: elevated bed height, pt occassionally requires CGA, pt would not let therapists provide much assist    Ambulation/Gait               General Gait Details: refused to take steps   Stairs             Wheelchair Mobility     Tilt Bed    Modified Rankin (Stroke Patients Only)       Balance Overall balance assessment: Needs assistance   Sitting balance-Leahy Scale: Good     Standing balance support: Bilateral upper extremity supported Standing balance-Leahy Scale: Good Standing balance comment: No LOB noted, poor posutral alignement. stood with heavy reliance on FWW.                            Communication Communication Communication: No apparent difficulties  Cognition Arousal: Alert Behavior During Therapy: [facility] for tasks assessed/performed  Following commands: Intact      Cueing Cueing Techniques: Verbal cues, Gestural cues  Exercises General Exercises - Lower Extremity Ankle Circles/Pumps: AROM, Both, 10 reps, Supine Heel Slides: AROM, Both, 10 reps, Supine Straight Leg Raises: AROM, Both, 10 reps, Supine Other Exercises Other Exercises: discussed UE exercises to perform bed level Other Exercises: Provided written instrucitons of same exs on white board of all exs.    General Comments General comments (skin integrity, edema, etc.): Por skin integrity, dressing, scabs and poor hygine.      Pertinent Vitals/Pain Pain Assessment Pain Assessment: 0-10 Pain Score: 8  Pain Descriptors / Indicators:  Discomfort Pain Intervention(s): Limited activity within patient's tolerance    Home Living                          Prior Function            PT Goals (current goals can now be found in the care plan section) Acute Rehab PT Goals Patient Stated Goal: to improve strength and breathing Time For Goal Achievement: 03/10/24 Potential to Achieve Goals: Fair Progress towards PT goals: Progressing toward goals    Frequency           PT Plan      Co-evaluation   Reason for Co-Treatment: To address functional/ADL transfers;For patient/therapist safety PT goals addressed during session: Mobility/safety with mobility;Balance OT goals addressed during session: ADL's and self-care      AM-PAC PT "6 Clicks" Mobility   Outcome Measure  Help needed turning from your back to your side while in a flat bed without using bedrails?: A Lot Help needed moving from lying on your back to sitting on the side of a flat bed without using bedrails?: A Lot Help needed moving to and from a bed to a chair (including a wheelchair)?: A Lot Help needed standing up from a chair using your arms (e.g., wheelchair or bedside chair)?: A Lot Help needed to walk in hospital room?: Total Help needed climbing 3-5 steps with a railing? : Total 6 Click Score: 10    End of Session Equipment Utilized During Treatment: Oxygen Activity Tolerance: Patient limited by fatigue Patient left: in bed;with call bell/phone within reach;with bed alarm set;Other (comment) Nurse Communication: Mobility status PT Visit Diagnosis: Other abnormalities of gait and mobility (R26.89);Muscle weakness (generalized) (M62.81)     Time: 0454-0981 PT Time Calculation (min) (ACUTE ONLY): 25 min  Charges:    $Therapeutic Activity: 8-22 mins PT General Charges $$ ACUTE PT VISIT: 1 Visit                    Janet Berlin PT DPT 2:09 PM,02/28/24

## 2024-02-29 DIAGNOSIS — Z86711 Personal history of pulmonary embolism: Secondary | ICD-10-CM | POA: Diagnosis not present

## 2024-02-29 DIAGNOSIS — R0902 Hypoxemia: Secondary | ICD-10-CM | POA: Diagnosis not present

## 2024-02-29 DIAGNOSIS — Z8673 Personal history of transient ischemic attack (TIA), and cerebral infarction without residual deficits: Secondary | ICD-10-CM | POA: Diagnosis not present

## 2024-02-29 DIAGNOSIS — I2510 Atherosclerotic heart disease of native coronary artery without angina pectoris: Secondary | ICD-10-CM | POA: Diagnosis not present

## 2024-02-29 DIAGNOSIS — J9601 Acute respiratory failure with hypoxia: Secondary | ICD-10-CM | POA: Diagnosis not present

## 2024-02-29 DIAGNOSIS — N1831 Chronic kidney disease, stage 3a: Secondary | ICD-10-CM | POA: Diagnosis not present

## 2024-02-29 DIAGNOSIS — K219 Gastro-esophageal reflux disease without esophagitis: Secondary | ICD-10-CM | POA: Diagnosis not present

## 2024-02-29 DIAGNOSIS — J449 Chronic obstructive pulmonary disease, unspecified: Secondary | ICD-10-CM | POA: Diagnosis not present

## 2024-02-29 DIAGNOSIS — I10 Essential (primary) hypertension: Secondary | ICD-10-CM | POA: Diagnosis not present

## 2024-02-29 DIAGNOSIS — Z720 Tobacco use: Secondary | ICD-10-CM | POA: Diagnosis not present

## 2024-02-29 DIAGNOSIS — M6281 Muscle weakness (generalized): Secondary | ICD-10-CM | POA: Diagnosis not present

## 2024-02-29 DIAGNOSIS — W19XXXD Unspecified fall, subsequent encounter: Secondary | ICD-10-CM | POA: Diagnosis not present

## 2024-02-29 DIAGNOSIS — R6889 Other general symptoms and signs: Secondary | ICD-10-CM | POA: Diagnosis not present

## 2024-02-29 DIAGNOSIS — G8929 Other chronic pain: Secondary | ICD-10-CM | POA: Diagnosis not present

## 2024-02-29 DIAGNOSIS — R532 Functional quadriplegia: Secondary | ICD-10-CM | POA: Diagnosis not present

## 2024-02-29 DIAGNOSIS — J441 Chronic obstructive pulmonary disease with (acute) exacerbation: Secondary | ICD-10-CM | POA: Diagnosis not present

## 2024-02-29 DIAGNOSIS — M5441 Lumbago with sciatica, right side: Secondary | ICD-10-CM | POA: Diagnosis not present

## 2024-02-29 DIAGNOSIS — I700 Atherosclerosis of aorta: Secondary | ICD-10-CM | POA: Diagnosis not present

## 2024-02-29 DIAGNOSIS — R1310 Dysphagia, unspecified: Secondary | ICD-10-CM | POA: Diagnosis not present

## 2024-02-29 DIAGNOSIS — I5033 Acute on chronic diastolic (congestive) heart failure: Secondary | ICD-10-CM | POA: Diagnosis not present

## 2024-02-29 DIAGNOSIS — L97828 Non-pressure chronic ulcer of other part of left lower leg with other specified severity: Secondary | ICD-10-CM | POA: Diagnosis not present

## 2024-02-29 DIAGNOSIS — Z9181 History of falling: Secondary | ICD-10-CM | POA: Diagnosis not present

## 2024-02-29 DIAGNOSIS — Z7901 Long term (current) use of anticoagulants: Secondary | ICD-10-CM | POA: Diagnosis not present

## 2024-02-29 DIAGNOSIS — E785 Hyperlipidemia, unspecified: Secondary | ICD-10-CM | POA: Diagnosis not present

## 2024-02-29 DIAGNOSIS — Z743 Need for continuous supervision: Secondary | ICD-10-CM | POA: Diagnosis not present

## 2024-02-29 DIAGNOSIS — E1169 Type 2 diabetes mellitus with other specified complication: Secondary | ICD-10-CM | POA: Diagnosis not present

## 2024-02-29 LAB — CBC
HCT: 35.1 % — ABNORMAL LOW (ref 39.0–52.0)
Hemoglobin: 9 g/dL — ABNORMAL LOW (ref 13.0–17.0)
MCH: 23.4 pg — ABNORMAL LOW (ref 26.0–34.0)
MCHC: 25.6 g/dL — ABNORMAL LOW (ref 30.0–36.0)
MCV: 91.2 fL (ref 80.0–100.0)
Platelets: 184 10*3/uL (ref 150–400)
RBC: 3.85 MIL/uL — ABNORMAL LOW (ref 4.22–5.81)
RDW: 20.4 % — ABNORMAL HIGH (ref 11.5–15.5)
WBC: 7.2 10*3/uL (ref 4.0–10.5)
nRBC: 0.3 % — ABNORMAL HIGH (ref 0.0–0.2)

## 2024-02-29 LAB — BASIC METABOLIC PANEL
BUN: 18 mg/dL (ref 8–23)
CO2: >45 mmol/L — ABNORMAL HIGH (ref 22–32)
Calcium: 7.8 mg/dL — ABNORMAL LOW (ref 8.9–10.3)
Chloride: 90 mmol/L — ABNORMAL LOW (ref 98–111)
Creatinine, Ser: 0.97 mg/dL (ref 0.61–1.24)
GFR, Estimated: >60 mL/min (ref >60)
Glucose, Bld: 117 mg/dL — ABNORMAL HIGH (ref 70–99)
Potassium: 4.2 mmol/L (ref 3.5–5.1)
Sodium: 138 mmol/L (ref 135–145)

## 2024-02-29 LAB — GLUCOSE, CAPILLARY
Glucose-Capillary: 245 mg/dL — ABNORMAL HIGH (ref 70–99)
Glucose-Capillary: 121 mg/dL — ABNORMAL HIGH (ref 70–99)

## 2024-02-29 LAB — MAGNESIUM: Magnesium: 2.1 mg/dL (ref 1.7–2.4)

## 2024-02-29 MED ORDER — FLUTICASONE FUROATE-VILANTEROL 200-25 MCG/ACT IN AEPB: 1.0000 | INHALATION_SPRAY | Freq: Every day | RESPIRATORY_TRACT | Status: DC

## 2024-02-29 MED ORDER — ALPRAZOLAM 0.25 MG PO TABS: 0.2500 mg | ORAL_TABLET | Freq: Every day | ORAL | 0 refills | Status: DC | PRN

## 2024-02-29 MED ORDER — NICOTINE 21 MG/24HR TD PT24: 21.0000 mg | MEDICATED_PATCH | Freq: Every day | TRANSDERMAL | Status: DC

## 2024-02-29 NOTE — TOC Transition Note (Addendum)
 Transition of Care [facility]) - Discharge Note   Patient Details  Name: Thomas Tanner MRN: [id number] Date of Birth: [birth year]  [facility] of [facility]) CM/SW Contact:  Rodney Langton, RN Phone Number: 02/29/2024, 10:55 AM   Clinical Narrative:     Patient with [facility]  Spoke with Kenney Houseman at facility, confirms patient can come today, bed 13A.  Bedside nurse aware to call report to [phone], discharge summary and orders sent to facility.  Attempted to inform wife of plan, number is not in service, voice message left on daughter's listed number.    Update @ 1120:  [facility] called for transport to SNF, medical necessity and facesheet completed and printed for transport.   Final next level of care: Skilled Nursing Facility Barriers to Discharge: Barriers Resolved   Patient Goals and CMS Choice Patient states their goals for this hospitalization and ongoing recovery are:: SNF for short term rehab          Discharge Placement              Patient chooses bed at: [facility] Patient to be transferred to facility by: [facility] Name of family member notified: Dub Mikes, message left. Patient and family notified of of transfer: 02/29/24  Discharge Plan and Services Additional resources added to the After Visit Summary for                                       Social Drivers of Health (SDOH) Interventions SDOH Screenings   Food Insecurity: No Food Insecurity (02/24/2024)  Housing: Low Risk  (02/24/2024)  Transportation Needs: No Transportation Needs (02/24/2024)  Utilities: Not At Risk (02/24/2024)  Alcohol Screen: Low Risk  (10/02/2021)  Depression (PHQ2-9): Low Risk  (11/18/2023)  Physical Activity: Unknown (12/08/2018)  Social Connections: Socially Integrated (02/26/2024)  Stress: No Stress Concern Present (12/08/2018)  Tobacco Use: High Risk (02/24/2024)     Readmission Risk Interventions     No data to display

## 2024-02-29 NOTE — Progress Notes (Signed)
 Attempted to contact SNF for report but was left holding >10 minutes. EMS here to transport pt to SNF

## 2024-02-29 NOTE — Discharge Summary (Signed)
 Physician Discharge Summary   Patient: Thomas Tanner MRN: [id number] DOB: [birth year]  Admit date:     02/24/2024  Discharge date: 02/29/24  Discharge Physician: Marrion Coy   PCP: Sallyanne Kuster, NP   Recommendations at discharge:   Follow-up with PCP in 1 week.  Discharge Diagnoses: Principal Problem:   Acute hypoxemic respiratory failure (HCC) Active Problems:   CAD in native artery   Acute on chronic diastolic (congestive) heart failure (HCC)   Hypertension   COPD with acute exacerbation (HCC)   Morbid obesity with BMI of 50.0-59.9, adult (HCC)   Hyperlipidemia associated with type 2 diabetes mellitus (HCC)   Tobacco abuse   Atherosclerosis of aorta (HCC)   Acute on chronic diastolic CHF (congestive heart failure) (HCC)  Resolved Problems:   * No resolved hospital problems. *  Hospital Course: Thomas Tanner is a 73 y.o. male  with medical history significant for diastolic CHF with EF 55-60%, COPD with chronic hypoxemia 4 L nasal cannula, prior history saddle PE on anticoagulation, type 2 diabetes, hypertension, dyslipidemia, prior TIA, and morbid obesity who presented to the ED with initial concerns of lower face tightness worse on the right as well as questionable bilateral facial droop.  EMS was called for evaluation of potential stroke.  However, EMS did not notice any focal deficits.  He also complained of shortness of breath, leg swelling and cough for approximately 1 week.  He recently completed a Z-Pak for upper respiratory tract infection. Patient was diagnosed with acute on chronic heart failure, was started on IV Lasix.  Assessment and Plan: Acute on chronic diastolic congestive heart failure. Acute on chronic hypoxemic respite failure secondary to congestive heart failure and COPD. He was normally on 3.5 liters oxygen, he developed significant hypoxemia, she is currently on 4L oxygen.  This is secondary to combination of COPD and acute on  chronic diastolic congestive heart. Patient had echocardiogram performed in 2022, ejection fraction normal, repeat echocardiogram EF 50 to 55%, diastolic parameters are indeterminate. 3/20. Patient still has evidence of volume overload Patient still has significant paroxysmal nocturnal dyspnea and the back edema, will continue IV Lasix.  BNP not elevated due to morbid obesity. 3/21. Patient condition gradually improving, will benefit from another day of IV Lasix.  May be able to transfer to nursing home tomorrow. 3/22.  Condition has improved, discontinue IV Lasix, transition to oral Lasix.  Medically stable for discharge to nursing home.   COPD exacerbation. Tobacco abuse. Patient had significant bronchospasm, he has recent upper respiratory infection, worsening cough.  Patient be treated with scheduled bronchodilator, also adding long-acting Breo. Advised to quit smoking. Condition much improved    Morbid obesity with BMI 55.79. More than class 3 obesity. Severe debility with wheelchair-bound status. Seen by PT/OT, now recommend nursing home placement.   History of saddle pulmonary embolism. History of TIA. Continue Eliquis.   Type 2 diabetes. Continue sliding scale insulin.   Iron deficient anemia. Patient has significant iron deficiency, received a dose of IV iron, B12 level borderline low, oral B12, also received a dose of injected B12. Hemoglobin stable.            Consultants: None Procedures performed: None  Disposition: Skilled nursing facility Diet recommendation:  Discharge Diet Orders (From admission, onward)     Start     Ordered   02/29/24 0000  Diet - low sodium heart healthy        02/29/24 1011  Cardiac diet DISCHARGE MEDICATION: Allergies as of 02/29/2024       Reactions   Morphine Anxiety, Other (See Comments)   Hallucinations Pt states 6.17.22 that he is not allergic to this medication   Azithromycin Itching   Localized pruritus  on arm with IV   Morphine And Codeine Anxiety        Medication List     STOP taking these medications    HYDROcodone-acetaminophen 5-325 MG tablet Commonly known as: NORCO/VICODIN   pneumococcal 20-valent conjugate vaccine 0.5 ML injection Commonly known as: PREVNAR 20   Tdap 5-2.5-18.5 LF-MCG/0.5 injection Commonly known as: BOOSTRIX   Zoster Vaccine Adjuvanted injection Commonly known as: SHINGRIX       TAKE these medications    albuterol 108 (90 Base) MCG/ACT inhaler Commonly known as: Ventolin HFA Inhale 2 puffs into the lungs every 6 (six) hours as needed for wheezing or shortness of breath.   ALPRAZolam 0.25 MG tablet Commonly known as: XANAX Take 1 tablet (0.25 mg total) by mouth daily as needed (severe anxiety). May take 1 additional dose after 2 hours if the first dose is not effective.   cetirizine 10 MG tablet Commonly known as: ZYRTEC Take 1 tablet by mouth once daily   cyanocobalamin 1000 MCG tablet Take 1 tablet (1,000 mcg total) by mouth daily.   Eliquis 5 MG Tabs tablet Generic drug: apixaban Take 1 tablet by mouth twice daily   famotidine 40 MG tablet Commonly known as: PEPCID Take 1 tablet (40 mg total) by mouth daily.   fluticasone furoate-vilanterol 200-25 MCG/ACT Aepb Commonly known as: BREO ELLIPTA Inhale 1 puff into the lungs daily. Start taking on: March 01, 2024   furosemide 40 MG tablet Commonly known as: Lasix Take 1 tablet (40 mg total) by mouth daily.   hydrOXYzine 25 MG tablet Commonly known as: ATARAX TAKE 1 TABLET BY MOUTH THREE TIMES DAILY AS NEEDED FOR ITCHING   ipratropium-albuterol 0.5-2.5 (3) MG/3ML Soln Commonly known as: DUONEB Take 3 mLs by nebulization every 6 (six) hours as needed.   iron polysaccharides 150 MG capsule Commonly known as: NIFEREX Take 1 capsule (150 mg total) by mouth daily.   lansoprazole 30 MG capsule Commonly known as: PREVACID TAKE 1 CAPSULE BY MOUTH ONCE DAILY BEFORE BREAKFAST    metoprolol succinate 50 MG 24 hr tablet Commonly known as: TOPROL-XL Take with or immediately following a meal.   nicotine 21 mg/24hr patch Commonly known as: NICODERM CQ - dosed in mg/24 hours Place 1 patch (21 mg total) onto the skin daily. Start taking on: March 01, 2024   OXYGEN Inhale 2.5 L into the lungs. 24 HRS CONTINUOUS OXYGEN USES [facility]   phenylephrine-shark liver oil-mineral oil-petrolatum 0.25-14-74.9 % rectal ointment Commonly known as: PREPARATION H Place 1 application rectally 2 (two) times daily as needed for hemorrhoids.   rosuvastatin 20 MG tablet Commonly known as: CRESTOR Take 1 tablet (20 mg total) by mouth daily.   senna-docusate 8.6-50 MG tablet Commonly known as: Senokot-S Take 1 tablet by mouth 2 (two) times daily.        Contact information for follow-up providers     Sallyanne Kuster, NP Follow up in 1 week(s).   Specialty: Nurse Practitioner Contact information: [address] [place] [address] [phone]              Contact information for after-discharge care     Destination     [facility] .   Service: Skilled  Nursing Contact information: [address] [place] [address] [phone]                    Discharge Exam: Ceasar Mons Weights   02/27/24 2000 02/28/24 0413 02/29/24 0500  Weight: (!) 176.6 kg (!) 177.6 kg (!) 174.6 kg   General exam: Appears calm and comfortable, obese obesity. Respiratory system: Clear to auscultation. Respiratory effort normal. Cardiovascular system: S1 & S2 heard, RRR. No JVD, murmurs, rubs, gallops or clicks. No pedal edema. Gastrointestinal system: Abdomen is nondistended, soft and nontender. No organomegaly or masses felt. Normal bowel sounds heard. Central nervous system: Alert and oriented. No focal neurological deficits. Extremities: Edema essentially resolved. Skin: No rashes, lesions or ulcers Psychiatry: Judgement and  insight appear normal. Mood & affect appropriate.    Condition at discharge: fair  The results of significant diagnostics from this hospitalization (including imaging, microbiology, ancillary and laboratory) are listed below for reference.   Imaging Studies: ECHOCARDIOGRAM COMPLETE Result Date: 02/25/2024    ECHOCARDIOGRAM REPORT   Patient Name:   Thomas Tanner Date of Exam: 02/25/2024 Medical Rec #:  [id number]                Height:       73.0 in Accession #:    [id number]               Weight:       414.2 lb Date of Birth:  [birth year]                 BSA:          2.932 m Patient Age:    73 years                 BP:           136/55 mmHg Patient Gender: M                        HR:           81 bpm. Exam Location:  [facility] Procedure: 2D Echo, Cardiac Doppler and Color Doppler (Both Spectral and Color            Flow Doppler were utilized during procedure). Indications:     CHF  History:         Patient has prior history of Echocardiogram examinations, most                  recent 05/19/2021. CHF, CAD, COPD, Signs/Symptoms:Bacteremia and                  Edema; Risk Factors:Diabetes, Dyslipidemia, Hypertension and                  Current Smoker.  Sonographer:     Mikki Harbor Referring Phys:  [id number] PRATIK D [facility] Diagnosing Phys: Windell Norfolk  Sonographer Comments: Technically difficult study due to poor echo windows, suboptimal apical window, no subcostal window and patient is obese. Image acquisition challenging due to COPD. IMPRESSIONS  1. Grossly appears to have normal left and right ventricular systolic function. Cannot assess for wall motion abnormalities.  2. Technically difficult study, definity not used.  3. Left ventricular ejection fraction, by estimation, is 50 to 55%. The left ventricle has low normal function. There is moderate left ventricular hypertrophy. Left ventricular diastolic parameters are indeterminate.  4. Right ventricular systolic function is normal. The right  ventricular size is normal.  5. The mitral valve is normal in structure. Trivial mitral valve regurgitation.  6. The aortic valve is tricuspid. Aortic valve regurgitation is not visualized.  7. Aortic dilatation noted. There is mild dilatation of the aortic root, measuring 42 mm. Conclusion(s)/Recommendation(s): Consider limited echo with definity for accurate LVEF and wall motion analysis if clinically indicated. FINDINGS  Left Ventricle: Left ventricular ejection fraction, by estimation, is 50 to 55%. The left ventricle has low normal function. The left ventricular internal cavity size was normal in size. There is moderate left ventricular hypertrophy. Left ventricular diastolic parameters are indeterminate. Right Ventricle: The right ventricular size is normal. No increase in right ventricular wall thickness. Right ventricular systolic function is normal. Left Atrium: Left atrial size was not well visualized. Right Atrium: Right atrial size was not well visualized. Pericardium: There is no evidence of pericardial effusion. Mitral Valve: The mitral valve is normal in structure. Trivial mitral valve regurgitation. MV peak gradient, 9.2 mmHg. The mean mitral valve gradient is 4.0 mmHg. Tricuspid Valve: The tricuspid valve is not well visualized. Tricuspid valve regurgitation is trivial. Aortic Valve: The aortic valve is tricuspid. Aortic valve regurgitation is not visualized. Aortic valve mean gradient measures 3.0 mmHg. Aortic valve peak gradient measures 4.8 mmHg. Aortic valve area, by VTI measures 2.67 cm. Pulmonic Valve: The pulmonic valve was not well visualized. Pulmonic valve regurgitation is mild. Aorta: Aortic dilatation noted. There is mild dilatation of the aortic root, measuring 42 mm. IAS/Shunts: The interatrial septum was not well visualized.  LEFT VENTRICLE PLAX 2D LVIDd:         5.60 cm LVIDs:         3.10 cm LV PW:         1.70 cm LV IVS:        1.70 cm LVOT diam:     2.30 cm LV SV:         62 LV  SV Index:   21 LVOT Area:     4.15 cm  LEFT ATRIUM         Index LA diam:    4.50 cm 1.53 cm/m  AORTIC VALVE                    PULMONIC VALVE AV Area (Vmax):    2.89 cm     PV Vmax:       1.07 m/s AV Area (Vmean):   2.40 cm     PV Peak grad:  4.6 mmHg AV Area (VTI):     2.67 cm AV Vmax:           110.00 cm/s AV Vmean:          77.400 cm/s AV VTI:            0.233 m AV Peak Grad:      4.8 mmHg AV Mean Grad:      3.0 mmHg LVOT Vmax:         76.60 cm/s LVOT Vmean:        44.700 cm/s LVOT VTI:          0.150 m LVOT/AV VTI ratio: 0.64  AORTA Ao Root diam: 4.20 cm Ao Asc diam:  3.70 cm MITRAL VALVE MV Area (PHT): 4.49 cm     SHUNTS MV Area VTI:   1.41 cm     Systemic VTI:  0.15 m MV Peak grad:  9.2 mmHg     Systemic Diam: 2.30 cm MV Mean grad:  4.0 mmHg MV  Vmax:       1.52 m/s MV Vmean:      93.5 cm/s MV Decel Time: 169 msec MV E velocity: 94.90 cm/s MV A velocity: 116.00 cm/s MV E/A ratio:  0.82 Windell Norfolk Electronically signed by Windell Norfolk Signature Date/Time: 02/25/2024/5:56:40 PM    Final    CT Angio Chest PE W/Cm &/Or Wo Cm Result Date: 02/24/2024 CLINICAL DATA:  Positive D-dimer, cough, leg swelling EXAM: CT ANGIOGRAPHY CHEST WITH CONTRAST TECHNIQUE: Multidetector CT imaging of the chest was performed using the standard protocol during bolus administration of intravenous contrast. Multiplanar CT image reconstructions and MIPs were obtained to evaluate the vascular anatomy. RADIATION DOSE REDUCTION: This exam was performed according to the departmental dose-optimization program which includes automated exposure control, adjustment of the mA and/or kV according to patient size and/or use of iterative reconstruction technique. CONTRAST:  75mL OMNIPAQUE IOHEXOL 350 MG/ML SOLN COMPARISON:  01/08/2023, 02/24/2024 FINDINGS: Cardiovascular: This is a technically suboptimal evaluation of the pulmonary vasculature, due to timing of the contrast bolus and respiratory motion during the study. I do not see any  large filling defect within the main pulmonary artery, though the segmental and subsegmental branches cannot be fully evaluated due to incomplete opacification. Mild cardiomegaly without pericardial effusion. No evidence of thoracic aortic aneurysm or dissection. Atherosclerosis of the aorta and coronary vasculature. Mediastinum/Nodes: Stable appearance of the thyroid, trachea, and esophagus. No pathologic adenopathy. Lungs/Pleura: There are small bilateral pleural effusions. There is dependent atelectasis within the right lower lobe. There is dense consolidation and volume loss within the right middle lobe and left lower lobe, consistent with atelectasis. No acute airspace disease. No pneumothorax. Central airways are patent. Upper Abdomen: No acute abnormality. Musculoskeletal: There are no acute displaced fractures. 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 Reconstructed images demonstrate no additional findings. Review of the MIP images confirms the above findings. IMPRESSION: 1. Limited evaluation of the pulmonary vasculature due to timing of contrast bolus and respiratory motion. I do not see any filling defect within the main pulmonary artery. The lobar, segmental, and subsegmental branches cannot be fully evaluated due to incomplete opacification. If pulmonary embolus remains a concern, repeat CT pulmonary angiography could be performed. 2. Small bilateral pleural effusions. 3. Atelectasis within the right middle, right lower, and left lower lobes as above. No acute airspace disease. 4. Cardiomegaly. 5.  Choose 1 coronary artery atherosclerosis. Electronically Signed   By: Sharlet Salina M.D.   On: 02/24/2024 17:11   DG Chest 2 View Result Date: 02/24/2024 CLINICAL DATA:  Cough. EXAM: CHEST - 2 VIEW COMPARISON:  01/08/2023. FINDINGS: There is homogeneous opacity at the right lung base, which is new since the prior study and is concerning for right lung lower  lobe partial collapse. Bilateral lung fields are otherwise clear. There is blunting of bilateral posterior costophrenic angles, suggesting bilateral trace pleural effusions. Stable cardio-mediastinal silhouette. No acute osseous abnormalities. The soft tissues are within normal limits. IMPRESSION: There is homogeneous opacity at the right lung base, which is new since the prior study and is concerning for right lung lower lobe partial collapse. Bilateral trace pleural effusions. Electronically Signed   By: Jules Schick M.D.   On: 02/24/2024 12:29   CT Head Wo Contrast Result Date: 02/24/2024 CLINICAL DATA:  TIA, sensation of facial droop. EXAM: CT HEAD WITHOUT CONTRAST TECHNIQUE: Contiguous axial images were obtained from the base of the skull through the vertex without intravenous contrast. RADIATION DOSE REDUCTION:  This exam was performed according to the departmental dose-optimization program which includes automated exposure control, adjustment of the mA and/or kV according to patient size and/or use of iterative reconstruction technique. COMPARISON:  MRI head 06/21/2013 FINDINGS: Brain: No acute intracranial hemorrhage. No CT evidence of acute infarct. Nonspecific hypoattenuation in the periventricular and subcortical white matter favored to reflect chronic microvascular ischemic changes. Focal hypoattenuation in the inferior aspect of the right basal ganglia likely reflecting a prominent perivascular space. No edema, mass effect, or midline shift. The basilar cisterns are patent. Ventricles: The ventricles are normal. Vascular: Atherosclerotic calcifications of the carotid siphons. No hyperdense vessel. Skull: No acute or aggressive finding. Orbits: Orbits are symmetric. Sinuses: The visualized paranasal sinuses are clear. Other: Mastoid air cells are clear. IMPRESSION: No CT evidence of acute intracranial abnormality. Electronically Signed   By: Emily Filbert M.D.   On: 02/24/2024 11:59     Microbiology: Results for orders placed or performed during the hospital encounter of 02/24/24  Resp panel by RT-PCR (RSV, Flu A&B, Covid) Anterior Nasal Swab     Status: None   Collection Time: 02/24/24 11:35 AM   Specimen: Anterior Nasal Swab  Result Value Ref Range Status   SARS Coronavirus 2 by RT PCR NEGATIVE NEGATIVE Final    Comment: (NOTE) SARS-CoV-2 target nucleic acids are NOT DETECTED.  The SARS-CoV-2 RNA is generally detectable in upper respiratory specimens during the acute phase of infection. The lowest concentration of SARS-CoV-2 viral copies this assay can detect is 138 copies/mL. A negative result does not preclude SARS-Cov-2 infection and should not be used as the sole basis for treatment or other patient management decisions. A negative result may occur with  improper specimen collection/handling, submission of specimen other than nasopharyngeal swab, presence of viral mutation(s) within the areas targeted by this assay, and inadequate number of viral copies(<138 copies/mL). A negative result must be combined with clinical observations, patient history, and epidemiological information. The expected result is Negative.  Fact Sheet for Patients:  BloggerCourse.com  Fact Sheet for Healthcare Providers:  SeriousBroker.it  This test is no t yet approved or cleared by the [place] FDA and  has been authorized for detection and/or diagnosis of SARS-CoV-2 by FDA under an Emergency Use Authorization (EUA). This EUA will remain  in effect (meaning this test can be used) for the duration of the COVID-19 declaration under Section 564(b)(1) of the Act, 21 U.S.C.section 360bbb-3(b)(1), unless the authorization is terminated  or revoked sooner.       Influenza A by PCR NEGATIVE NEGATIVE Final   Influenza B by PCR NEGATIVE NEGATIVE Final    Comment: (NOTE) The Xpert Xpress SARS-CoV-2/FLU/RSV plus assay is  intended as an aid in the diagnosis of influenza from Nasopharyngeal swab specimens and should not be used as a sole basis for treatment. Nasal washings and aspirates are unacceptable for Xpert Xpress SARS-CoV-2/FLU/RSV testing.  Fact Sheet for Patients: BloggerCourse.com  Fact Sheet for Healthcare Providers: SeriousBroker.it  This test is not yet approved or cleared by the [place] FDA and has been authorized for detection and/or diagnosis of SARS-CoV-2 by FDA under an Emergency Use Authorization (EUA). This EUA will remain in effect (meaning this test can be used) for the duration of the COVID-19 declaration under Section 564(b)(1) of the Act, 21 U.S.C. section 360bbb-3(b)(1), unless the authorization is terminated or revoked.     Resp Syncytial Virus by PCR NEGATIVE NEGATIVE Final    Comment: (NOTE) Fact Sheet for Patients: BloggerCourse.com  Fact Sheet for Healthcare Providers: SeriousBroker.it  This test is not yet approved or cleared by the [place] FDA and has been authorized for detection and/or diagnosis of SARS-CoV-2 by FDA under an Emergency Use Authorization (EUA). This EUA will remain in effect (meaning this test can be used) for the duration of the COVID-19 declaration under Section 564(b)(1) of the Act, 21 U.S.C. section 360bbb-3(b)(1), unless the authorization is terminated or revoked.  Performed at [facility], [address] [place], [place] [address]     Labs: CBC: Recent Labs  Lab 02/24/24 1134 02/26/24 0504 02/28/24 0410 02/29/24 0407  WBC 6.7 7.3 6.6 7.2  NEUTROABS 5.3  --   --   --   HGB 8.9* 9.0* 8.5* 9.0*  HCT 32.3* 34.8* 31.9* 35.1*  MCV 84.3 88.8 85.3 91.2  PLT 189 185 188 184   Basic Metabolic Panel: Recent Labs  Lab 02/25/24 0618 02/26/24 0832 02/27/24 0444 02/28/24 0410 02/28/24 0608 02/29/24 0656  NA  138 143 140 139 141 138  K 4.4 3.9 5.1 6.9* 3.7 4.2  CL 95* 93* 90* 89* 87* 90*  CO2 37* 44* 44* >45* 42* >45*  GLUCOSE 152* 109* 105* 94 98 117*  BUN 12 16 18 17 17 18   CREATININE 0.92 1.07 1.08 1.06 1.07 0.97  CALCIUM 8.5* 8.3* 7.8* 7.7* 7.9* 7.8*  MG 2.1 2.2 2.2 2.2  --  2.1   Liver Function Tests: Recent Labs  Lab 02/24/24 1134  AST 14*  ALT 7  ALKPHOS 95  BILITOT 1.2  PROT 6.4*  ALBUMIN 3.1*   CBG: Recent Labs  Lab 02/28/24 0908 02/28/24 1131 02/28/24 1607 02/28/24 2211 02/29/24 0842  GLUCAP 90 121* 127* 115* 245*    Discharge time spent: greater than 30 minutes.  Signed: Marrion Coy, MD [facility] 02/29/2024

## 2024-02-29 NOTE — Plan of Care (Signed)

## 2024-03-02 DIAGNOSIS — R1310 Dysphagia, unspecified: Secondary | ICD-10-CM | POA: Diagnosis not present

## 2024-03-02 DIAGNOSIS — J9601 Acute respiratory failure with hypoxia: Secondary | ICD-10-CM | POA: Diagnosis not present

## 2024-03-02 DIAGNOSIS — I5033 Acute on chronic diastolic (congestive) heart failure: Secondary | ICD-10-CM | POA: Diagnosis not present

## 2024-03-02 DIAGNOSIS — E1169 Type 2 diabetes mellitus with other specified complication: Secondary | ICD-10-CM | POA: Diagnosis not present

## 2024-03-02 DIAGNOSIS — L97828 Non-pressure chronic ulcer of other part of left lower leg with other specified severity: Secondary | ICD-10-CM | POA: Diagnosis not present

## 2024-03-02 DIAGNOSIS — J441 Chronic obstructive pulmonary disease with (acute) exacerbation: Secondary | ICD-10-CM | POA: Diagnosis not present

## 2024-03-02 DIAGNOSIS — I10 Essential (primary) hypertension: Secondary | ICD-10-CM | POA: Diagnosis not present

## 2024-03-02 DIAGNOSIS — R532 Functional quadriplegia: Secondary | ICD-10-CM | POA: Diagnosis not present

## 2024-03-03 DIAGNOSIS — W19XXXD Unspecified fall, subsequent encounter: Secondary | ICD-10-CM | POA: Diagnosis not present

## 2024-03-03 DIAGNOSIS — I10 Essential (primary) hypertension: Secondary | ICD-10-CM | POA: Diagnosis not present

## 2024-03-03 DIAGNOSIS — M6281 Muscle weakness (generalized): Secondary | ICD-10-CM | POA: Diagnosis not present

## 2024-03-03 DIAGNOSIS — J441 Chronic obstructive pulmonary disease with (acute) exacerbation: Secondary | ICD-10-CM | POA: Diagnosis not present

## 2024-03-04 DIAGNOSIS — R1310 Dysphagia, unspecified: Secondary | ICD-10-CM | POA: Diagnosis not present

## 2024-03-04 DIAGNOSIS — I5033 Acute on chronic diastolic (congestive) heart failure: Secondary | ICD-10-CM | POA: Diagnosis not present

## 2024-03-04 DIAGNOSIS — J441 Chronic obstructive pulmonary disease with (acute) exacerbation: Secondary | ICD-10-CM | POA: Diagnosis not present

## 2024-03-04 DIAGNOSIS — Z86711 Personal history of pulmonary embolism: Secondary | ICD-10-CM | POA: Diagnosis not present

## 2024-03-09 ENCOUNTER — Other Ambulatory Visit: Payer: Self-pay | Admitting: Nurse Practitioner

## 2024-03-09 DIAGNOSIS — J3089 Other allergic rhinitis: Secondary | ICD-10-CM

## 2024-03-12 DIAGNOSIS — I5033 Acute on chronic diastolic (congestive) heart failure: Secondary | ICD-10-CM | POA: Diagnosis not present

## 2024-03-12 DIAGNOSIS — M6281 Muscle weakness (generalized): Secondary | ICD-10-CM | POA: Diagnosis not present

## 2024-03-12 DIAGNOSIS — E1169 Type 2 diabetes mellitus with other specified complication: Secondary | ICD-10-CM | POA: Diagnosis not present

## 2024-03-12 DIAGNOSIS — Z9181 History of falling: Secondary | ICD-10-CM | POA: Diagnosis not present

## 2024-03-12 DIAGNOSIS — L97828 Non-pressure chronic ulcer of other part of left lower leg with other specified severity: Secondary | ICD-10-CM | POA: Diagnosis not present

## 2024-03-12 DIAGNOSIS — R532 Functional quadriplegia: Secondary | ICD-10-CM | POA: Diagnosis not present

## 2024-03-16 ENCOUNTER — Ambulatory Visit: Payer: Medicare Other | Admitting: Nurse Practitioner

## 2024-03-16 DIAGNOSIS — R1310 Dysphagia, unspecified: Secondary | ICD-10-CM | POA: Diagnosis not present

## 2024-03-18 DIAGNOSIS — R1310 Dysphagia, unspecified: Secondary | ICD-10-CM | POA: Diagnosis not present

## 2024-03-26 ENCOUNTER — Telehealth: Admitting: Nurse Practitioner

## 2024-03-26 ENCOUNTER — Encounter: Payer: Self-pay | Admitting: Nurse Practitioner

## 2024-03-26 VITALS — Resp 16 | Wt 380.0 lb

## 2024-03-26 DIAGNOSIS — E1159 Type 2 diabetes mellitus with other circulatory complications: Secondary | ICD-10-CM | POA: Diagnosis not present

## 2024-03-26 DIAGNOSIS — E785 Hyperlipidemia, unspecified: Secondary | ICD-10-CM | POA: Diagnosis not present

## 2024-03-26 DIAGNOSIS — Z79899 Other long term (current) drug therapy: Secondary | ICD-10-CM

## 2024-03-26 DIAGNOSIS — F419 Anxiety disorder, unspecified: Secondary | ICD-10-CM

## 2024-03-26 DIAGNOSIS — E1169 Type 2 diabetes mellitus with other specified complication: Secondary | ICD-10-CM | POA: Diagnosis not present

## 2024-03-26 DIAGNOSIS — K219 Gastro-esophageal reflux disease without esophagitis: Secondary | ICD-10-CM | POA: Diagnosis not present

## 2024-03-26 DIAGNOSIS — R262 Difficulty in walking, not elsewhere classified: Secondary | ICD-10-CM

## 2024-03-26 DIAGNOSIS — Z86711 Personal history of pulmonary embolism: Secondary | ICD-10-CM

## 2024-03-26 DIAGNOSIS — M5441 Lumbago with sciatica, right side: Secondary | ICD-10-CM | POA: Diagnosis not present

## 2024-03-26 DIAGNOSIS — Z09 Encounter for follow-up examination after completed treatment for conditions other than malignant neoplasm: Secondary | ICD-10-CM

## 2024-03-26 DIAGNOSIS — J9611 Chronic respiratory failure with hypoxia: Secondary | ICD-10-CM

## 2024-03-26 DIAGNOSIS — G8929 Other chronic pain: Secondary | ICD-10-CM

## 2024-03-26 DIAGNOSIS — I152 Hypertension secondary to endocrine disorders: Secondary | ICD-10-CM

## 2024-03-26 MED ORDER — METOPROLOL SUCCINATE ER 50 MG PO TB24: ORAL_TABLET | ORAL | 1 refills | Status: DC

## 2024-03-26 MED ORDER — ROSUVASTATIN CALCIUM 20 MG PO TABS: 20.0000 mg | ORAL_TABLET | Freq: Every day | ORAL | 1 refills | Status: DC

## 2024-03-26 MED ORDER — FAMOTIDINE 40 MG PO TABS
40.0000 mg | ORAL_TABLET | Freq: Every day | ORAL | 1 refills | Status: DC
Start: 2024-03-26 — End: 2024-05-18

## 2024-03-26 MED ORDER — FUROSEMIDE 40 MG PO TABS: 40.0000 mg | ORAL_TABLET | Freq: Every day | ORAL | 11 refills | Status: DC

## 2024-03-26 MED ORDER — ALPRAZOLAM 0.25 MG PO TABS: 0.2500 mg | ORAL_TABLET | Freq: Every day | ORAL | 1 refills | Status: DC | PRN

## 2024-03-26 MED ORDER — APIXABAN 5 MG PO TABS: 5.0000 mg | ORAL_TABLET | Freq: Two times a day (BID) | ORAL | 0 refills | Status: DC

## 2024-03-26 MED ORDER — LANSOPRAZOLE 30 MG PO CPDR
DELAYED_RELEASE_CAPSULE | ORAL | 1 refills | Status: DC
Start: 2024-03-26 — End: 2024-05-18

## 2024-03-26 NOTE — Progress Notes (Signed)
 [facility] Thomas Tanner, [place] [address] [place] [address] [phone]                                   Transitional Care Clinic   [facility] Discharge Acute Issues Care Follow Up  Telehealth Visit                                                                      Patient Demographics  Thomas Tanner, is a 73 y.o. male  DOB 1951-11-13  MRN [id number].  Primary MD  Laurence Pons, NP  I connected with the patient at 1610 by telephone and verified the patients identity using two identifiers.   I discussed the limitations, risks, security and privacy concerns of performing an evaluation and management service by telephone and the availability of in person appointments. I also discussed with the patient that there may be a patient responsible charge related to the service.  The patient expressed understanding and agrees to proceed.    Admit date: 02/24/24 Discharge date: 02/29/24  Reason for TCC follow Up - acute hypoxemic respiratory failure.   Past Medical History:  Diagnosis Date   Arthritis    CHF (congestive heart failure) (HCC)    COPD (chronic obstructive pulmonary disease) (HCC)    Diabetes (HCC)    Hyperlipidemia    Hypertension    Kidney stone    Obesity    Tachycardia    TIA (transient ischemic attack)     Past Surgical History:  Procedure Laterality Date   CORONARY/GRAFT ACUTE MI REVASCULARIZATION N/A 12/08/2018   Procedure: Coronary/Graft Acute MI Revascularization;  Surgeon: Antonette Batters, MD;  Location: [facility] INVASIVE CV LAB;  Service: Cardiovascular;  Laterality: N/A;   INCISION AND DRAINAGE ABSCESS N/A 05/18/2021   Procedure: INCISION AND DRAINAGE SCROTAL ABSCESS, cystoscopy with urethral dilation, and complex catheter placement;  Surgeon: Lawerence Pressman, MD;  Location: [facility] ORS;  Service: Urology;  Laterality: N/A;   IRRIGATION AND DEBRIDEMENT ABSCESS N/A 05/20/2021   Procedure: IRRIGATION AND DEBRIDEMENT  ABSCESS;  Surgeon: Geraline Knapp, MD;  Location: [facility] ORS;  Service: Urology;  Laterality: N/A;   IRRIGATION AND DEBRIDEMENT ABSCESS N/A 05/21/2021   Procedure: IRRIGATION AND DEBRIDEMENT ABSCESS;  Surgeon: Geraline Knapp, MD;  Location: [facility] ORS;  Service: Urology;  Laterality: N/A;   KIDNEY STONE SURGERY     left arm surgery  1963   LEFT HEART CATH AND CORONARY ANGIOGRAPHY N/A 12/08/2018   Procedure: LEFT HEART CATH AND CORONARY ANGIOGRAPHY;  Surgeon: Antonette Batters, MD;  Location: [facility] INVASIVE CV LAB;  Service: Cardiovascular;  Laterality: N/A;   PULMONARY THROMBECTOMY N/A 08/03/2020   Procedure: PULMONARY THROMBECTOMY / THROMBOLYSIS;  Surgeon: Celso College, MD;  Location: [facility] INVASIVE CV LAB;  Service: Cardiovascular;  Laterality: N/A;   PULMONARY THROMBECTOMY Bilateral 01/08/2023   Procedure: PULMONARY THROMBECTOMY;  Surgeon: Jackquelyn Mass, MD;  Location: [facility] INVASIVE CV LAB;  Service: Cardiovascular;  Laterality: Bilateral;   RECTAL EXAM UNDER ANESTHESIA N/A 05/20/2021   Procedure: EXAM UNDER ANESTHESIA-Dressing and Change;  Surgeon: Geraline Knapp, MD;  Location: [facility] ORS;  Service: Urology;  Laterality: N/A;  Recent HPI and Hospital Course  Hospital Course: Thomas Tanner is a 73 y.o. male  with medical history significant for diastolic CHF with EF 55-60%, COPD with chronic hypoxemia 4 L nasal cannula, prior history saddle PE on anticoagulation, type 2 diabetes, hypertension, dyslipidemia, prior TIA, and morbid obesity who presented to the ED with initial concerns of lower face tightness worse on the right as well as questionable bilateral facial droop.  EMS was called for evaluation of potential stroke.  However, EMS did not notice any focal deficits.  He also complained of shortness of breath, leg swelling and cough for approximately 1 week.  He recently completed a Z-Pak for upper respiratory tract infection. Patient was diagnosed with acute on chronic  heart failure, was started on IV Lasix .   Assessment and Plan: Acute on chronic diastolic congestive heart failure. Acute on chronic hypoxemic respite failure secondary to congestive heart failure and COPD. He was normally on 3.5 liters oxygen , he developed significant hypoxemia, she is currently on 4L oxygen .  This is secondary to combination of COPD and acute on chronic diastolic congestive heart. Patient had echocardiogram performed in 2022, ejection fraction normal, repeat echocardiogram EF 50 to 55%, diastolic parameters are indeterminate. 3/20. Patient still has evidence of volume overload Patient still has significant paroxysmal nocturnal dyspnea and the back edema, will continue IV Lasix .  BNP not elevated due to morbid obesity. 3/21. Patient condition gradually improving, will benefit from another day of IV Lasix .  May be able to transfer to nursing home tomorrow. 3/22.  Condition has improved, discontinue IV Lasix , transition to oral Lasix .  Medically stable for discharge to nursing home.   COPD exacerbation. Tobacco abuse. Patient had significant bronchospasm, he has recent upper respiratory infection, worsening cough.  Patient be treated with scheduled bronchodilator, also adding long-acting Breo. Advised to quit smoking. Condition much improved     Morbid obesity with BMI 55.79. More than class 3 obesity. Severe debility with wheelchair-bound status. Seen by PT/OT, now recommend nursing home placement.   History of saddle pulmonary embolism. History of TIA. Continue Eliquis .   Type 2 diabetes. Continue sliding scale insulin .   Iron  deficient anemia. Patient has significant iron  deficiency, received a dose of IV iron , B12 level borderline low, oral B12, also received a dose of injected B12. Hemoglobin stable.  Post Hospital Acute Care Issue to be followed in the Clinic   Acute hypoxemic respiratory failure (HCC) Active Problems:   CAD in native artery   Acute on  chronic diastolic (congestive) heart failure (HCC)   Hypertension   COPD with acute exacerbation (HCC)   Morbid obesity with BMI of 50.0-59.9, adult (HCC)   Hyperlipidemia associated with type 2 diabetes mellitus (HCC)   Tobacco abuse   Atherosclerosis of aorta (HCC)   Acute on chronic diastolic CHF (congestive heart failure) (HCC)   Subjective:   Essie Hefty today has, No headache, No chest pain, No abdominal pain - No Nausea, No new weakness tingling or numbness, has baseline SOB, intermittent wheezing, intermittent chest tightness and chronic cough.   Assessment & Plan   1. Hospital discharge follow-up (Primary) Treated in hospital for hypoxic respiratory failure.   2. Hypertension associated with type 2 diabetes mellitus (HCC) Continue medications as prescribed. Unable to assess BP due to virtual visits. Patient does not have BP cuff.  - metoprolol  succinate (TOPROL -XL) 50 MG 24 hr tablet; Take with or immediately following a meal.  Dispense: 90 tablet; Refill: 1 - furosemide  (LASIX ) 40  MG tablet; Take 1 tablet (40 mg total) by mouth daily.  Dispense: 30 tablet; Refill: 11  3. Hyperlipidemia associated with type 2 diabetes mellitus (HCC) Continue rosuvastatin  as prescribed.  - rosuvastatin  (CRESTOR ) 20 MG tablet; Take 1 tablet (20 mg total) by mouth daily.  Dispense: 90 tablet; Refill: 1  4. Chronic respiratory failure with hypoxia (HCC) Continue supplemental oxygen  use and continue use of maintenance inhalers, rescue inhalers and neb treatments as needed.   5. Chronic midline low back pain with right-sided sciatica May take prn OTC tylenol  as needed   6. Ambulatory dysfunction Due to difficulty with ambulation and transportation, patient is unable to come into the office   7. Gastroesophageal reflux disease without esophagitis Continue famotidine  and lansoprazole  as prescribed. - lansoprazole  (PREVACID ) 30 MG capsule; TAKE 1 CAPSULE BY MOUTH ONCE DAILY BEFORE  BREAKFAST  Dispense: 90 capsule; Refill: 1 - famotidine  (PEPCID ) 40 MG tablet; Take 1 tablet (40 mg total) by mouth daily.  Dispense: 180 tablet; Refill: 1  8. History of pulmonary embolism Continue eliquis  as prescribed.  - apixaban  (ELIQUIS ) 5 MG TABS tablet; Take 1 tablet (5 mg total) by mouth 2 (two) times daily.  Dispense: 180 tablet; Refill: 0  9. Anxiety Continue prn alprazolam  as prescribed.  - ALPRAZolam  (XANAX ) 0.25 MG tablet; Take 1 tablet (0.25 mg total) by mouth daily as needed (severe anxiety). May take 1 additional dose after 2 hours if the first dose is not effective.  Dispense: 30 tablet; Refill: 1    Reason for frequent admissions/ER visits    COPD  Chronic respiratory failure CHF Hypertension High cholesterol Oxygen  dependent History of PE diabetes   Objective:   Vitals:   03/26/24 1522  Resp: 16  Weight: (!) 380 lb (172.4 kg)    Wt Readings from Last 3 Encounters:  03/26/24 (!) 380 lb (172.4 kg)  02/29/24 (!) 384 lb 14.8 oz (174.6 kg)  12/16/23 (!) 415 lb (188.2 kg)    Allergies as of 03/26/2024       Reactions   Morphine Anxiety, Other (See Comments)   Hallucinations Pt states 6.17.22 that he is not allergic to this medication   Azithromycin  Itching   Localized pruritus on arm with IV   Morphine And Codeine Anxiety        Medication List        Accurate as of March 26, 2024 11:59 PM. If you have any questions, ask your nurse or doctor.          albuterol  108 (90 Base) MCG/ACT inhaler Commonly known as: Ventolin  HFA Inhale 2 puffs into the lungs every 6 (six) hours as needed for wheezing or shortness of breath.   ALPRAZolam  0.25 MG tablet Commonly known as: XANAX  Take 1 tablet (0.25 mg total) by mouth daily as needed (severe anxiety). May take 1 additional dose after 2 hours if the first dose is not effective.   apixaban  5 MG Tabs tablet Commonly known as: Eliquis  Take 1 tablet (5 mg total) by mouth 2 (two) times daily.    cetirizine  10 MG tablet Commonly known as: ZYRTEC  Take 1 tablet by mouth once daily   cyanocobalamin  1000 MCG tablet Take 1 tablet (1,000 mcg total) by mouth daily.   famotidine  40 MG tablet Commonly known as: PEPCID  Take 1 tablet (40 mg total) by mouth daily.   fluticasone  furoate-vilanterol 200-25 MCG/ACT Aepb Commonly known as: BREO ELLIPTA  Inhale 1 puff into the lungs daily.   furosemide  40 MG tablet Commonly  known as: Lasix  Take 1 tablet (40 mg total) by mouth daily.   hydrOXYzine  25 MG tablet Commonly known as: ATARAX  TAKE 1 TABLET BY MOUTH THREE TIMES DAILY AS NEEDED FOR ITCHING   ipratropium-albuterol  0.5-2.5 (3) MG/3ML Soln Commonly known as: DUONEB Take 3 mLs by nebulization every 6 (six) hours as needed.   iron  polysaccharides 150 MG capsule Commonly known as: NIFEREX Take 1 capsule (150 mg total) by mouth daily.   lansoprazole  30 MG capsule Commonly known as: PREVACID  TAKE 1 CAPSULE BY MOUTH ONCE DAILY BEFORE BREAKFAST   metoprolol  succinate 50 MG 24 hr tablet Commonly known as: TOPROL -XL Take with or immediately following a meal.   nicotine  21 mg/24hr patch Commonly known as: NICODERM CQ  - dosed in mg/24 hours Place 1 patch (21 mg total) onto the skin daily.   OXYGEN  Inhale 2.5 L into the lungs. 24 HRS CONTINUOUS OXYGEN  USES [facility]   phenylephrine -shark liver oil-mineral oil-petrolatum  0.25-14-74.9 % rectal ointment Commonly known as: PREPARATION H Place 1 application rectally 2 (two) times daily as needed for hemorrhoids.   rosuvastatin  20 MG tablet Commonly known as: CRESTOR  Take 1 tablet (20 mg total) by mouth daily.   senna-docusate 8.6-50 MG tablet Commonly known as: Senokot-S Take 1 tablet by mouth 2 (two) times daily.         Objective Data: He is alert and oriented. No acute distress noted.    Data Review   Micro Results No results found for this or any previous visit (from the past 240 hours).   CBC No  results for input(s): "WBC", "HGB", "HCT", "PLT", "MCV", "MCH", "MCHC", "RDW", "LYMPHSABS", "MONOABS", "EOSABS", "BASOSABS", "BANDABS" in the last 168 hours.  Invalid input(s): "NEUTRABS", "BANDSABD"  Chemistries  No results for input(s): "NA", "K", "CL", "CO2", "GLUCOSE", "BUN", "CREATININE", "CALCIUM ", "MG", "AST", "ALT", "ALKPHOS", "BILITOT" in the last 168 hours.  Invalid input(s): "GFRCGP" ------------------------------------------------------------------------------------------------------------------ CrCl cannot be calculated (Patient's most recent lab result is older than the maximum 21 days allowed.). ------------------------------------------------------------------------------------------------------------------ No results for input(s): "HGBA1C" in the last 72 hours. ------------------------------------------------------------------------------------------------------------------ No results for input(s): "CHOL", "HDL", "LDLCALC", "TRIG", "CHOLHDL", "LDLDIRECT" in the last 72 hours. ------------------------------------------------------------------------------------------------------------------ No results for input(s): "TSH", "T4TOTAL", "T3FREE", "THYROIDAB" in the last 72 hours.  Invalid input(s): "FREET3" ------------------------------------------------------------------------------------------------------------------ No results for input(s): "VITAMINB12", "FOLATE", "FERRITIN", "TIBC", "IRON ", "RETICCTPCT" in the last 72 hours.  Coagulation profile No results for input(s): "INR", "PROTIME" in the last 168 hours.  No results for input(s): "DDIMER" in the last 72 hours.  Cardiac Enzymes No results for input(s): "CKMB", "TROPONINI", "MYOGLOBIN" in the last 168 hours.  Invalid input(s): "CK" ------------------------------------------------------------------------------------------------------------------ Invalid input(s): "POCBNP"  Return in about 4 months (around 07/26/2024)  for F/U, Holton Sidman PCP in person if possible. .   Time Spent in minutes  45 Time spent with patient included reviewing progress notes, labs, imaging studies, and discussing plan for follow up.   This patient was seen by Laurence Pons, FNP-C in collaboration with Dr. Verneta Gone as a part of collaborative care agreement.    Laurence Pons MSN, FNP-C on 03/26/2024 at 4:09 PM   **Disclaimer: This note may have been dictated with voice recognition software. Similar sounding words can inadvertently be transcribed and this note may contain transcription errors which may not have been corrected upon publication of note.**

## 2024-04-01 DIAGNOSIS — J449 Chronic obstructive pulmonary disease, unspecified: Secondary | ICD-10-CM | POA: Diagnosis not present

## 2024-04-15 ENCOUNTER — Telehealth: Payer: Self-pay | Admitting: Nurse Practitioner

## 2024-04-15 NOTE — Telephone Encounter (Signed)
 Received Chronic Condition Verification Form from [facility]. Gave to Alyssa to complete-Toni

## 2024-04-20 ENCOUNTER — Telehealth: Payer: Self-pay | Admitting: Nurse Practitioner

## 2024-04-20 NOTE — Telephone Encounter (Signed)
 Chronic Condition Verification Form signed. Faxed back to [facility]; [phone]. Scanned-Toni

## 2024-05-01 ENCOUNTER — Encounter: Payer: Self-pay | Admitting: Nurse Practitioner

## 2024-05-01 DIAGNOSIS — J449 Chronic obstructive pulmonary disease, unspecified: Secondary | ICD-10-CM | POA: Diagnosis not present

## 2024-05-13 ENCOUNTER — Emergency Department

## 2024-05-13 ENCOUNTER — Emergency Department
Admission: EM | Admit: 2024-05-13 | Discharge: 2024-05-14 | Disposition: A | Discharge status: Home / Self Care | Attending: Emergency Medicine | Admitting: Emergency Medicine

## 2024-05-13 DIAGNOSIS — Z8673 Personal history of transient ischemic attack (TIA), and cerebral infarction without residual deficits: Secondary | ICD-10-CM | POA: Diagnosis not present

## 2024-05-13 DIAGNOSIS — E119 Type 2 diabetes mellitus without complications: Secondary | ICD-10-CM | POA: Diagnosis not present

## 2024-05-13 DIAGNOSIS — J9611 Chronic respiratory failure with hypoxia: Secondary | ICD-10-CM | POA: Insufficient documentation

## 2024-05-13 DIAGNOSIS — N3090 Cystitis, unspecified without hematuria: Secondary | ICD-10-CM | POA: Insufficient documentation

## 2024-05-13 DIAGNOSIS — J9811 Atelectasis: Secondary | ICD-10-CM | POA: Diagnosis not present

## 2024-05-13 DIAGNOSIS — I509 Heart failure, unspecified: Secondary | ICD-10-CM | POA: Insufficient documentation

## 2024-05-13 DIAGNOSIS — R0902 Hypoxemia: Secondary | ICD-10-CM | POA: Diagnosis not present

## 2024-05-13 DIAGNOSIS — I110 Hypertensive heart disease with heart failure: Secondary | ICD-10-CM | POA: Insufficient documentation

## 2024-05-13 DIAGNOSIS — R5383 Other fatigue: Secondary | ICD-10-CM | POA: Insufficient documentation

## 2024-05-13 DIAGNOSIS — R531 Weakness: Secondary | ICD-10-CM | POA: Diagnosis not present

## 2024-05-13 DIAGNOSIS — I499 Cardiac arrhythmia, unspecified: Secondary | ICD-10-CM | POA: Diagnosis not present

## 2024-05-13 DIAGNOSIS — W19XXXA Unspecified fall, initial encounter: Secondary | ICD-10-CM | POA: Diagnosis not present

## 2024-05-13 DIAGNOSIS — I4510 Unspecified right bundle-branch block: Secondary | ICD-10-CM | POA: Diagnosis not present

## 2024-05-13 DIAGNOSIS — R0600 Dyspnea, unspecified: Secondary | ICD-10-CM | POA: Diagnosis not present

## 2024-05-13 DIAGNOSIS — I517 Cardiomegaly: Secondary | ICD-10-CM | POA: Diagnosis not present

## 2024-05-13 DIAGNOSIS — J449 Chronic obstructive pulmonary disease, unspecified: Secondary | ICD-10-CM | POA: Diagnosis not present

## 2024-05-13 LAB — URINALYSIS, W/ REFLEX TO CULTURE (INFECTION SUSPECTED)
Bilirubin Urine: NEGATIVE
Glucose, UA: NEGATIVE mg/dL
Hgb urine dipstick: NEGATIVE
Ketones, ur: NEGATIVE mg/dL
Nitrite: NEGATIVE
Protein, ur: 30 mg/dL — AB
Specific Gravity, Urine: 1.018 (ref 1.005–1.030)
WBC, UA: >50 WBC/hpf (ref 0–5)
pH: 6 (ref 5.0–8.0)

## 2024-05-13 LAB — CBC WITH DIFFERENTIAL/PLATELET
Abs Immature Granulocytes: 0.09 10*3/uL — ABNORMAL HIGH (ref 0.00–0.07)
Basophils Absolute: 0 10*3/uL (ref 0.0–0.1)
Basophils Relative: 0 %
Eosinophils Absolute: 0 10*3/uL (ref 0.0–0.5)
Eosinophils Relative: 0 %
HCT: 31.3 % — ABNORMAL LOW (ref 39.0–52.0)
Hemoglobin: 9.5 g/dL — ABNORMAL LOW (ref 13.0–17.0)
Immature Granulocytes: 1 %
Lymphocytes Relative: 7 %
Lymphs Abs: 0.7 10*3/uL (ref 0.7–4.0)
MCH: 29.3 pg (ref 26.0–34.0)
MCHC: 30.4 g/dL (ref 30.0–36.0)
MCV: 96.6 fL (ref 80.0–100.0)
Monocytes Absolute: 0.7 10*3/uL (ref 0.1–1.0)
Monocytes Relative: 8 %
Neutro Abs: 7.8 10*3/uL — ABNORMAL HIGH (ref 1.7–7.7)
Neutrophils Relative %: 84 %
Platelets: 182 10*3/uL (ref 150–400)
RBC: 3.24 MIL/uL — ABNORMAL LOW (ref 4.22–5.81)
RDW: 21.5 % — ABNORMAL HIGH (ref 11.5–15.5)
Smear Review: NORMAL
WBC: 9.3 10*3/uL (ref 4.0–10.5)
nRBC: 0.3 % — ABNORMAL HIGH (ref 0.0–0.2)

## 2024-05-13 LAB — BASIC METABOLIC PANEL WITH GFR
Anion gap: 6 (ref 5–15)
BUN: 14 mg/dL (ref 8–23)
CO2: 41 mmol/L — ABNORMAL HIGH (ref 22–32)
Calcium: 8.1 mg/dL — ABNORMAL LOW (ref 8.9–10.3)
Chloride: 96 mmol/L — ABNORMAL LOW (ref 98–111)
Creatinine, Ser: 0.87 mg/dL (ref 0.61–1.24)
GFR, Estimated: >60 mL/min (ref >60)
Glucose, Bld: 116 mg/dL — ABNORMAL HIGH (ref 70–99)
Potassium: 5.1 mmol/L (ref 3.5–5.1)
Sodium: 143 mmol/L (ref 135–145)

## 2024-05-13 LAB — RESP PANEL BY RT-PCR (RSV, FLU A&B, COVID)  RVPGX2
Influenza A by PCR: NEGATIVE
Influenza B by PCR: NEGATIVE
Resp Syncytial Virus by PCR: NEGATIVE
SARS Coronavirus 2 by RT PCR: NEGATIVE

## 2024-05-13 LAB — BRAIN NATRIURETIC PEPTIDE: B Natriuretic Peptide: 195.2 pg/mL — ABNORMAL HIGH (ref 0.0–100.0)

## 2024-05-13 LAB — TROPONIN I (HIGH SENSITIVITY): Troponin I (High Sensitivity): 10 ng/L (ref <18)

## 2024-05-13 MED ORDER — CEPHALEXIN 500 MG PO CAPS
500.0000 mg | ORAL_CAPSULE | Freq: Once | ORAL | Status: AC
  Administered 2024-05-13: 500 mg via ORAL
  Filled 2024-05-13: qty 1

## 2024-05-13 MED ORDER — CEPHALEXIN 500 MG PO CAPS
500.0000 mg | ORAL_CAPSULE | Freq: Three times a day (TID) | ORAL | 0 refills | Status: DC
Start: 2024-05-13 — End: 2024-05-18

## 2024-05-13 NOTE — ED Notes (Signed)
 [facility] was called to transport patient to his home spoke with Bridgette Campus

## 2024-05-13 NOTE — ED Provider Notes (Signed)
 [facility] Provider Note    Event Date/Time   First MD Initiated Contact with Patient 05/13/24 2058     (approximate)   History   Chief Complaint: Weakness   HPI  Thomas Tanner is a 73 y.o. male with a history of CHF COPD diabetes hypertension and super morbid obesity who comes to the ED due to generalized weakness starting at about 7:00 PM, felt fatigued.  Marvell Slider to the ground was unable to get himself up, so called EMS.  First responders were able to assist the patient back up off the floor.  He weighs 170 kg.  Patient denies shortness of breath or chest pain or fever or other acute illness.  Reports compliance with his medications.  EMS noted initial oxygen  saturation of 86% on his usual 3 L nasal cannula so they placed him on increased oxygen  for transport to the hospital.        Past Medical History:  Diagnosis Date   Arthritis    CHF (congestive heart failure) (HCC)    COPD (chronic obstructive pulmonary disease) (HCC)    Diabetes (HCC)    Hyperlipidemia    Hypertension    Kidney stone    Obesity    Tachycardia    TIA (transient ischemic attack)     Current Outpatient Rx   Order #: [id number] Class: Normal   Order #: [id number] Class: Normal   Order #: [id number] Class: Normal   Order #: [id number] Class: Normal   Order #: [id number] Class: Normal   Order #: [id number] Class: Normal   Order #: [id number] Class: No Print   Order #: [id number] Class: Normal   Order #: [id number] Class: Normal   Order #: [id number] Class: Normal   Order #: [id number] Class: Normal   Order #: [id number] Class: Normal   Order #: [id number] Class: Normal   Order #: [id number] Class: No Print   Order #: [id number] Class: Historical Med   Order #: [id number] Class: No Print   Order #: [id number] Class: Normal   Order #: [id number] Class: No Print   Order #: [id number] Class: Normal    Past Surgical History:  Procedure Laterality Date   CORONARY/GRAFT ACUTE MI  REVASCULARIZATION N/A 12/08/2018   Procedure: Coronary/Graft Acute MI Revascularization;  Surgeon: Antonette Batters, MD;  Location: [facility];  Service: Cardiovascular;  Laterality: N/A;   INCISION AND DRAINAGE ABSCESS N/A 05/18/2021   Procedure: INCISION AND DRAINAGE SCROTAL ABSCESS, cystoscopy with urethral dilation, and complex catheter placement;  Surgeon: Lawerence Pressman, MD;  Location: [facility] ORS;  Service: Urology;  Laterality: N/A;   IRRIGATION AND DEBRIDEMENT ABSCESS N/A 05/20/2021   Procedure: IRRIGATION AND DEBRIDEMENT ABSCESS;  Surgeon: Geraline Knapp, MD;  Location: [facility] ORS;  Service: Urology;  Laterality: N/A;   IRRIGATION AND DEBRIDEMENT ABSCESS N/A 05/21/2021   Procedure: IRRIGATION AND DEBRIDEMENT ABSCESS;  Surgeon: Geraline Knapp, MD;  Location: [facility] OR[facility];  Service: Urology;  Laterality: N/A;   KIDNEY STONE SURGERY     left arm surgery  1963   LEFT HEART CATH AND CORONARY ANGIOGRAPHY N/A 12/08/2018   Procedure: LEFT HEART CATH AND CORONARY ANGIOGRAPHY;  Surgeon: Antonette Batters, MD;  Location: [facility];  Service: Cardiovascular;  Laterality: N/A;   PULMONARY THROMBECTOMY N/A 08/03/2020   Procedure: PULMONARY THROMBECTOMY / THROMBOLYSIS;  Surgeon: Celso College, MD;  Location: [facility];  Service: Cardiovascular;  Laterality: N/A;   PULMONARY THROMBECTOMY Bilateral 01/08/2023   Procedure: PULMONARY THROMBECTOMY;  Surgeon: Jackquelyn Mass, MD;  Location: [facility];  Service: Cardiovascular;  Laterality: Bilateral;   RECTAL EXAM UNDER ANESTHESIA N/A 05/20/2021   Procedure: EXAM UNDER ANESTHESIA-Dressing and Change;  Surgeon: Geraline Knapp, MD;  Location: [facility] ORS;  Service: Urology;  Laterality: N/A;    Physical Exam   Triage Vital Signs: ED Triage Vitals [05/13/24 2057]  Encounter Vitals Group     BP (!) 142/56     Systolic BP Percentile      Diastolic BP Percentile      Pulse Rate 85     Resp 20     Temp      Temp src       SpO2 94 %     Weight      Height      Head Circumference      Peak Flow      Pain Score      Pain Loc      Pain Education      Exclude from Growth Chart     Most recent vital signs: Vitals:   05/13/24 2115 05/13/24 2130  BP:    Pulse:  81  Resp:  19  Temp: 99.1 F (37.3 C)   SpO2:  92%    General: Awake, no distress.  CV:  Good peripheral perfusion.  Regular rate and rhythm, normal distal pulses Resp:  Normal effort.  Clear to auscultation bilaterally without wheezes or crackles.  Normal expiratory phase Abd:  No distention.  Soft nontender Other:  2+ pitting edema bilateral lower extremities.  Brawny skin thickening bilaterally   ED Results / Procedures / Treatments   Labs (all labs ordered are listed, but only abnormal results are displayed) Labs Reviewed  BASIC METABOLIC PANEL WITH GFR - Abnormal; Notable for the following components:      Result Value   Chloride 96 (*)    CO2 41 (*)    Glucose, Bld 116 (*)    Calcium  8.1 (*)    All other components within normal limits  CBC WITH DIFFERENTIAL/PLATELET - Abnormal; Notable for the following components:   RBC 3.24 (*)    Hemoglobin 9.5 (*)    HCT 31.3 (*)    RDW 21.5 (*)    nRBC 0.3 (*)    Neutro Abs 7.8 (*)    Abs Immature Granulocytes 0.09 (*)    All other components within normal limits  URINALYSIS, W/ REFLEX TO CULTURE (INFECTION SUSPECTED) - Abnormal; Notable for the following components:   Color, Urine AMBER (*)    APPearance CLOUDY (*)    Protein, ur 30 (*)    Leukocytes,Ua MODERATE (*)    Bacteria, UA MANY (*)    All other components within normal limits  RESP PANEL BY RT-PCR (RSV, FLU A&B, COVID)  [id number]  URINE CULTURE  BRAIN NATRIURETIC PEPTIDE  TROPONIN I (HIGH SENSITIVITY)     EKG Interpreted by me Atrial fibrillation, rate of 83.  Normal axis, normal intervals.  Right bundle branch block.  No acute ischemic changes   RADIOLOGY Chest x-ray interpreted by me, unremarkable.   Radiology report reviewed   PROCEDURES:  Procedures   MEDICATIONS ORDERED IN ED: Medications  cephALEXin  (KEFLEX ) capsule 500 mg (has no administration in time range)     IMPRESSION / MDM / ASSESSMENT AND PLAN / ED COURSE  I reviewed the triage vital signs and the nursing notes.  DDx: UTI, pneumonia, AKI, electrolyte derangement, anemia, COVID, influenza, less likely NSTEMI.  Doubt PE dissection pericardial effusion or sepsis  Patient's presentation is  most consistent with acute presentation with potential threat to life or bodily function.  Patient presents with fatigue for 2 hours today.  Fell to the ground at home and was unable to get himself up which I think is due to his super morbid obesity and chronic medical illnesses.  In the ED he is nontoxic, calm and comfortable, oxygen  saturation 95% on his usual 3 L nasal cannula, exam is benign and reassuring.  Labs are unremarkable, UA does show evidence of UTI.  Will start Keflex .  Patient is comfortable with continued outpatient management which I think is appropriate.       FINAL CLINICAL IMPRESSION(S) / ED DIAGNOSES   Final diagnoses:  Fatigue, unspecified type  Cystitis  Chronic respiratory failure with hypoxia (HCC)  Morbid obesity (HCC)     Rx / DC Orders   ED Discharge Orders          Ordered    cephALEXin  (KEFLEX ) 500 MG capsule  3 times daily        05/13/24 2230             Note:  This document was prepared using Dragon voice recognition software and may include unintentional dictation errors.   Jacquie Maudlin, MD 05/13/24 2234

## 2024-05-13 NOTE — ED Triage Notes (Signed)
 EMS reports patient called out for a fall and weakness x 2 hours. Initially O2 saturations were 86% on EMS arrival on his normal 3 L [place].

## 2024-05-14 DIAGNOSIS — R6889 Other general symptoms and signs: Secondary | ICD-10-CM | POA: Diagnosis not present

## 2024-05-14 DIAGNOSIS — R0902 Hypoxemia: Secondary | ICD-10-CM | POA: Diagnosis not present

## 2024-05-14 DIAGNOSIS — Z743 Need for continuous supervision: Secondary | ICD-10-CM | POA: Diagnosis not present

## 2024-05-16 LAB — URINE CULTURE: Culture: >=100000 — AB

## 2024-05-17 ENCOUNTER — Emergency Department

## 2024-05-17 ENCOUNTER — Inpatient Hospital Stay
Admission: EM | Admit: 2024-05-17 | Discharge: 2024-06-09 | DRG: 296 | Disposition: E | Attending: Internal Medicine | Admitting: Internal Medicine

## 2024-05-17 DIAGNOSIS — I110 Hypertensive heart disease with heart failure: Secondary | ICD-10-CM | POA: Diagnosis not present

## 2024-05-17 DIAGNOSIS — I509 Heart failure, unspecified: Secondary | ICD-10-CM | POA: Diagnosis not present

## 2024-05-17 DIAGNOSIS — E8721 Acute metabolic acidosis: Secondary | ICD-10-CM

## 2024-05-17 DIAGNOSIS — I469 Cardiac arrest, cause unspecified: Secondary | ICD-10-CM | POA: Diagnosis not present

## 2024-05-17 DIAGNOSIS — Z79899 Other long term (current) drug therapy: Secondary | ICD-10-CM

## 2024-05-17 DIAGNOSIS — R918 Other nonspecific abnormal finding of lung field: Secondary | ICD-10-CM | POA: Diagnosis not present

## 2024-05-17 DIAGNOSIS — G928 Other toxic encephalopathy: Secondary | ICD-10-CM | POA: Diagnosis not present

## 2024-05-17 DIAGNOSIS — E785 Hyperlipidemia, unspecified: Secondary | ICD-10-CM | POA: Diagnosis present

## 2024-05-17 DIAGNOSIS — E119 Type 2 diabetes mellitus without complications: Secondary | ICD-10-CM | POA: Diagnosis not present

## 2024-05-17 DIAGNOSIS — G931 Anoxic brain damage, not elsewhere classified: Secondary | ICD-10-CM | POA: Diagnosis present

## 2024-05-17 DIAGNOSIS — J9601 Acute respiratory failure with hypoxia: Secondary | ICD-10-CM | POA: Diagnosis not present

## 2024-05-17 DIAGNOSIS — R0989 Other specified symptoms and signs involving the circulatory and respiratory systems: Secondary | ICD-10-CM | POA: Diagnosis not present

## 2024-05-17 DIAGNOSIS — E872 Acidosis, unspecified: Secondary | ICD-10-CM | POA: Diagnosis present

## 2024-05-17 DIAGNOSIS — Z86711 Personal history of pulmonary embolism: Secondary | ICD-10-CM | POA: Diagnosis not present

## 2024-05-17 DIAGNOSIS — Z7901 Long term (current) use of anticoagulants: Secondary | ICD-10-CM

## 2024-05-17 DIAGNOSIS — R579 Shock, unspecified: Secondary | ICD-10-CM | POA: Diagnosis not present

## 2024-05-17 DIAGNOSIS — I255 Ischemic cardiomyopathy: Secondary | ICD-10-CM | POA: Diagnosis present

## 2024-05-17 DIAGNOSIS — N179 Acute kidney failure, unspecified: Secondary | ICD-10-CM

## 2024-05-17 DIAGNOSIS — F17210 Nicotine dependence, cigarettes, uncomplicated: Secondary | ICD-10-CM | POA: Diagnosis present

## 2024-05-17 DIAGNOSIS — D72829 Elevated white blood cell count, unspecified: Secondary | ICD-10-CM

## 2024-05-17 DIAGNOSIS — N390 Urinary tract infection, site not specified: Secondary | ICD-10-CM | POA: Diagnosis not present

## 2024-05-17 DIAGNOSIS — Z515 Encounter for palliative care: Secondary | ICD-10-CM

## 2024-05-17 DIAGNOSIS — R9389 Abnormal findings on diagnostic imaging of other specified body structures: Secondary | ICD-10-CM | POA: Diagnosis not present

## 2024-05-17 DIAGNOSIS — Z8249 Family history of ischemic heart disease and other diseases of the circulatory system: Secondary | ICD-10-CM

## 2024-05-17 DIAGNOSIS — Z66 Do not resuscitate: Secondary | ICD-10-CM | POA: Diagnosis not present

## 2024-05-17 DIAGNOSIS — R570 Cardiogenic shock: Secondary | ICD-10-CM | POA: Diagnosis not present

## 2024-05-17 DIAGNOSIS — J9602 Acute respiratory failure with hypercapnia: Secondary | ICD-10-CM | POA: Diagnosis not present

## 2024-05-17 DIAGNOSIS — Z9981 Dependence on supplemental oxygen: Secondary | ICD-10-CM

## 2024-05-17 DIAGNOSIS — Z6841 Body Mass Index (BMI) 40.0 and over, adult: Secondary | ICD-10-CM | POA: Diagnosis not present

## 2024-05-17 DIAGNOSIS — J449 Chronic obstructive pulmonary disease, unspecified: Secondary | ICD-10-CM | POA: Diagnosis not present

## 2024-05-17 DIAGNOSIS — Z4682 Encounter for fitting and adjustment of non-vascular catheter: Secondary | ICD-10-CM | POA: Diagnosis not present

## 2024-05-17 LAB — CBC
HCT: 34.5 % — ABNORMAL LOW (ref 39.0–52.0)
Hemoglobin: 10.4 g/dL — ABNORMAL LOW (ref 13.0–17.0)
MCH: 31.3 pg (ref 26.0–34.0)
MCHC: 30.1 g/dL (ref 30.0–36.0)
MCV: 103.9 fL — ABNORMAL HIGH (ref 80.0–100.0)
Platelets: 235 10*3/uL (ref 150–400)
RBC: 3.32 MIL/uL — ABNORMAL LOW (ref 4.22–5.81)
RDW: 21.2 % — ABNORMAL HIGH (ref 11.5–15.5)
WBC: 22.7 10*3/uL — ABNORMAL HIGH (ref 4.0–10.5)
nRBC: 4.4 % — ABNORMAL HIGH (ref 0.0–0.2)

## 2024-05-17 LAB — BLOOD GAS, ARTERIAL
Acid-base deficit: 0.8 mmol/L (ref 0.0–2.0)
Bicarbonate: 30.3 mmol/L — ABNORMAL HIGH (ref 20.0–28.0)
FIO2: 100 %
MECHVT: 550 mL
O2 Saturation: 97.3 %
PEEP: 12 cmH2O
Patient temperature: 33.8
RATE: 22 {breaths}/min
pCO2 arterial: 74 mmHg (ref 32–48)
pH, Arterial: 7.2 — ABNORMAL LOW (ref 7.35–7.45)
pO2, Arterial: 78 mmHg — ABNORMAL LOW (ref 83–108)

## 2024-05-17 LAB — MRSA NEXT GEN BY PCR, NASAL: MRSA by PCR Next Gen: NOT DETECTED

## 2024-05-17 LAB — RESP PANEL BY RT-PCR (RSV, FLU A&B, COVID)  RVPGX2
Influenza A by PCR: NEGATIVE
Influenza B by PCR: NEGATIVE
Resp Syncytial Virus by PCR: NEGATIVE
SARS Coronavirus 2 by RT PCR: NEGATIVE

## 2024-05-17 LAB — BASIC METABOLIC PANEL WITH GFR
Anion gap: 19 — ABNORMAL HIGH (ref 5–15)
BUN: 22 mg/dL (ref 8–23)
CO2: 27 mmol/L (ref 22–32)
Calcium: 8.4 mg/dL — ABNORMAL LOW (ref 8.9–10.3)
Chloride: 95 mmol/L — ABNORMAL LOW (ref 98–111)
Creatinine, Ser: 1.65 mg/dL — ABNORMAL HIGH (ref 0.61–1.24)
GFR, Estimated: 44 mL/min — ABNORMAL LOW (ref >60)
Glucose, Bld: 122 mg/dL — ABNORMAL HIGH (ref 70–99)
Potassium: 5.3 mmol/L — ABNORMAL HIGH (ref 3.5–5.1)
Sodium: 141 mmol/L (ref 135–145)

## 2024-05-17 LAB — BRAIN NATRIURETIC PEPTIDE: B Natriuretic Peptide: 317.8 pg/mL — ABNORMAL HIGH (ref 0.0–100.0)

## 2024-05-17 LAB — GLUCOSE, CAPILLARY: Glucose-Capillary: 113 mg/dL — ABNORMAL HIGH (ref 70–99)

## 2024-05-17 LAB — TROPONIN I (HIGH SENSITIVITY)
Troponin I (High Sensitivity): 46 ng/L — ABNORMAL HIGH (ref <18)
Troponin I (High Sensitivity): 59 ng/L — ABNORMAL HIGH (ref <18)

## 2024-05-17 LAB — PROTIME-INR
INR: 1.6 — ABNORMAL HIGH (ref 0.8–1.2)
Prothrombin Time: 19.7 s — ABNORMAL HIGH (ref 11.4–15.2)

## 2024-05-17 LAB — LACTIC ACID, PLASMA: Lactic Acid, Venous: 6.3 mmol/L (ref 0.5–1.9)

## 2024-05-17 LAB — CBG MONITORING, ED: Glucose-Capillary: 109 mg/dL — ABNORMAL HIGH (ref 70–99)

## 2024-05-17 MED ORDER — GLYCOPYRROLATE 1 MG PO TABS: 1.0000 mg | ORAL_TABLET | ORAL | Status: DC | PRN

## 2024-05-17 MED ORDER — GLYCOPYRROLATE 0.2 MG/ML IJ SOLN
0.2000 mg | INTRAMUSCULAR | Status: DC | PRN
Start: 2024-05-17 — End: 2024-05-17

## 2024-05-17 MED ORDER — NOREPINEPHRINE BITARTRATE 1 MG/ML IV SOLN
INTRAVENOUS | Status: AC | PRN
  Administered 2024-05-17: 10 ug/kg/min via INTRAVENOUS

## 2024-05-17 MED ORDER — EPINEPHRINE 1 MG/10ML IJ SOSY
PREFILLED_SYRINGE | INTRAMUSCULAR | Status: AC | PRN
  Administered 2024-05-17: 1 mg via INTRAVENOUS

## 2024-05-17 MED ORDER — SODIUM CHLORIDE 0.9 % IV SOLN
INTRAVENOUS | Status: AC | PRN
  Administered 2024-05-17: 1000 mL/h via INTRAVENOUS

## 2024-05-17 MED ORDER — ACETAMINOPHEN 325 MG PO TABS: 650.0000 mg | ORAL_TABLET | Freq: Four times a day (QID) | ORAL | Status: DC | PRN

## 2024-05-17 MED ORDER — CHLORHEXIDINE GLUCONATE CLOTH 2 % EX PADS
6.0000 | MEDICATED_PAD | Freq: Every day | CUTANEOUS | Status: DC
  Administered 2024-05-17: 6 via TOPICAL

## 2024-05-17 MED ORDER — SODIUM BICARBONATE 8.4 % IV SOLN
INTRAVENOUS | Status: AC | PRN
  Administered 2024-05-17: 50 meq via INTRAVENOUS

## 2024-05-17 MED ORDER — DOCUSATE SODIUM 100 MG PO CAPS: 100.0000 mg | ORAL_CAPSULE | Freq: Two times a day (BID) | ORAL | Status: DC | PRN

## 2024-05-17 MED ORDER — GLYCOPYRROLATE 0.2 MG/ML IJ SOLN: 0.2000 mg | INTRAMUSCULAR | Status: DC | PRN

## 2024-05-17 MED ORDER — POLYETHYLENE GLYCOL 3350 17 G PO PACK: 17.0000 g | PACK | Freq: Every day | ORAL | Status: DC | PRN

## 2024-05-17 MED ORDER — SODIUM CHLORIDE 0.9 % IV SOLN: 2.0000 g | Freq: Once | INTRAVENOUS | Status: DC

## 2024-05-17 MED ORDER — EPINEPHRINE HCL 5 MG/250ML IV SOLN IN NS
0.5000 ug/min | INTRAVENOUS | Status: DC
  Administered 2024-05-17: 0.5 ug/min via INTRAVENOUS
  Filled 2024-05-17: qty 250

## 2024-05-17 MED ORDER — ACETAMINOPHEN 650 MG RE SUPP: 650.0000 mg | Freq: Four times a day (QID) | RECTAL | Status: DC | PRN

## 2024-05-17 MED ORDER — MIDAZOLAM HCL 2 MG/2ML IJ SOLN
2.0000 mg | INTRAMUSCULAR | Status: DC | PRN
  Administered 2024-05-17: 4 mg via INTRAVENOUS
  Filled 2024-05-17: qty 4

## 2024-05-17 MED ORDER — MORPHINE SULFATE (PF) 2 MG/ML IV SOLN
2.0000 mg | INTRAVENOUS | Status: DC | PRN
  Administered 2024-05-17: 4 mg via INTRAVENOUS
  Filled 2024-05-17: qty 2

## 2024-05-17 MED ORDER — NOREPINEPHRINE 4 MG/250ML-% IV SOLN
0.0000 ug/min | INTRAVENOUS | Status: DC
  Administered 2024-05-17 (×2): 35 ug/min via INTRAVENOUS
  Administered 2024-05-17: 33 ug/min via INTRAVENOUS
  Administered 2024-05-17: 20 ug/min via INTRAVENOUS
  Filled 2024-05-17 (×4): qty 250

## 2024-05-17 MED ORDER — INSULIN ASPART 100 UNIT/ML IJ SOLN: 0.0000 [IU] | INTRAMUSCULAR | Status: DC

## 2024-05-17 MED ORDER — LACTATED RINGERS IV BOLUS
2000.0000 mL | Freq: Once | INTRAVENOUS | Status: AC
  Administered 2024-05-17: 2000 mL via INTRAVENOUS

## 2024-05-17 MED ORDER — SODIUM CHLORIDE 0.9 % IV BOLUS: 1000.0000 mL | Freq: Once | INTRAVENOUS | Status: DC

## 2024-05-17 MED ORDER — SODIUM CHLORIDE 0.9 % IV SOLN: INTRAVENOUS | Status: DC

## 2024-05-17 MED ORDER — POLYVINYL ALCOHOL 1.4 % OP SOLN
1.0000 [drp] | Freq: Four times a day (QID) | OPHTHALMIC | Status: DC | PRN
Start: 2024-05-17 — End: 2024-05-17

## 2024-05-18 ENCOUNTER — Telehealth: Admitting: Nurse Practitioner

## 2024-05-18 LAB — BLOOD CULTURE ID PANEL (REFLEXED) - BCID2

## 2024-05-18 MED FILL — Norepinephrine-Dextrose IV Solution 4 MG/250ML-5%: INTRAVENOUS | Qty: 250 | Status: AC

## 2024-05-19 LAB — CULTURE, BLOOD (ROUTINE X 2)

## 2024-05-21 LAB — CULTURE, BLOOD (ROUTINE X 2): Culture: NO GROWTH

## 2024-06-01 DIAGNOSIS — J449 Chronic obstructive pulmonary disease, unspecified: Secondary | ICD-10-CM | POA: Diagnosis not present

## 2024-06-09 NOTE — ED Notes (Addendum)
 Per Dr. Valetta Gaudy can d/c sepsis workup, intensivist will be canceling sepsis workup and pt will be comfort care.

## 2024-06-09 NOTE — Progress Notes (Signed)
 Patient noted to have no apical pulse times one minute and no respirations for one minute at 1408, verified with second nurse.  Family at bedside and provider notified of passing.

## 2024-06-09 NOTE — Sepsis Progress Note (Signed)
 Elink following for sepsis protocol.

## 2024-06-09 NOTE — ED Triage Notes (Addendum)
 Pt brought in by [facility] for cardiac arrest.  Cardiac arrest was witnessed by wife who called 911.  Pt stated that he wasn't feeling well and became unresponsive.  Upon arrival to ED, CPR in progress, pt intubaed, IO access establised by EMS in right tibial tuberosity.  Per EMS, ROSC achieved after 2 rounds of epi, but pt coded again.  8 more rounds of epi given by EMS prior to arrival to ED without ROSC.

## 2024-06-09 NOTE — Progress Notes (Signed)
 Patient arrived to unit, on pressor support, no sedation, unresponsive with decreasing sats. No escalation of care per family wishes per MD. Plan for comfort care once family arrives.   Visitor policy/CC policy explained to family.   Sister of spouse at bedside, screaming at RN, [facility] called per family request.

## 2024-06-09 NOTE — ED Provider Notes (Signed)
 [facility] Provider Note    Event Date/Time   First MD Initiated Contact with Patient 05/18/2024 [phone]     (approximate)   History   Cardiac Arrest  EM caveat cardiac arrest.  History gained from EMS also discussed with patient's wife [name] via phone  HPI  Thomas Tanner is a 73 y.o. male who had an unwitnessed cardiac arrest.  EMS reports initially found in asystole.  Last known normal was around 10 PM.  They were able to resuscitate the patient with intermittent pulses and episodes of PEA.  He did not have any shockable rhythms.  He was intubated with end-tidal CO2 as high as 90 and secure airway.  EMS reports positive pulse on arrival to the emergency department     Physical Exam   Triage Vital Signs: ED Triage Vitals  Encounter Vitals Group     BP      Systolic BP Percentile      Diastolic BP Percentile      Pulse      Resp      Temp      Temp src      SpO2      Weight      Height      Head Circumference      Peak Flow      Pain Score      Pain Loc      Pain Education      Exclude from Growth Chart     Most recent vital signs: There were no vitals filed for this visit.   General: Unresponsive.  Bag valve mask via endotracheal tube end-tidal CO2 with Square waveform.  Breath sounds equal bilateral with bagging.  Patient not overbreathing And pupils nonresponsive CV:  Cool periphery.  Mottled lower extremity bilateral venous stasis Resp:  Assisted ventilations.  Equal bilateral diminished bilateral Abd:  No distention.  Obese no obvious abnormality Other:  No spontaneous movements.  No spontaneous respirations. Carotid pulses present after 2 rounds of CPR.  I suspect he may have had pulses on arrival but it did take some time and cardiac ultrasound/carotid Doppler 2 easily identifies area of carotid pulse.  ED Results / Procedures / Treatments   Labs (all labs ordered are listed, but only abnormal results are  displayed) Labs Reviewed  BASIC METABOLIC PANEL WITH GFR - Abnormal; Notable for the following components:      Result Value   Potassium 5.3 (*)    Chloride 95 (*)    Glucose, Bld 122 (*)    Creatinine, Ser 1.65 (*)    Calcium  8.4 (*)    GFR, Estimated 44 (*)    Anion gap 19 (*)    All other components within normal limits  CBC - Abnormal; Notable for the following components:   WBC 22.7 (*)    RBC 3.32 (*)    Hemoglobin 10.4 (*)    HCT 34.5 (*)    MCV 103.9 (*)    RDW 21.2 (*)    nRBC 4.4 (*)    All other components within normal limits  BRAIN NATRIURETIC PEPTIDE - Abnormal; Notable for the following components:   B Natriuretic Peptide 317.8 (*)    All other components within normal limits  BLOOD GAS, ARTERIAL - Abnormal; Notable for the following components:   pH, Arterial 7.2 (*)    pCO2 arterial 74 (*)    pO2, Arterial 78 (*)    Bicarbonate 30.3 (*)  All other components within normal limits  CBG MONITORING, ED - Abnormal; Notable for the following components:   Glucose-Capillary 109 (*)    All other components within normal limits  TROPONIN I (HIGH SENSITIVITY) - Abnormal; Notable for the following components:   Troponin I (High Sensitivity) 46 (*)    All other components within normal limits  CULTURE, BLOOD (ROUTINE X 2)  CULTURE, BLOOD (ROUTINE X 2)  RESP PANEL BY RT-PCR (RSV, FLU A&B, COVID)  RVPGX2  MRSA NEXT GEN BY PCR, NASAL  COMPREHENSIVE METABOLIC PANEL WITH GFR  LIPASE, BLOOD  LACTIC ACID, PLASMA  LACTIC ACID, PLASMA  PROTIME-INR  URINALYSIS, W/ REFLEX TO CULTURE (INFECTION SUSPECTED)  HEPATIC FUNCTION PANEL  LEGIONELLA PNEUMOPHILA SEROGP 1 UR AG  STREP PNEUMONIAE URINARY ANTIGEN  CBG MONITORING, ED  TROPONIN I (HIGH SENSITIVITY)   Medication list includes anticoagulant Eliquis , unable to obtain history to know if he is reliably taking it at this second  EKG  Interpreted by me as no STEMI.  Right bundle branch block.  Suspect underlying  A-fib.   RADIOLOGY   Chest x-ray interpreted by me on portable.  I suspect the endotracheal tube is positioned in the trachea but difficult to see in this light/setting where it terminates.  The patient is nonetheless ventilating well, has appropriate end-tidal CO2 and waveform confirming good ventilation at this time.  Await radiologist to determine where the tip of the endotracheal tube lies      PROCEDURES:  Critical Care performed: Yes, see critical care procedure note(s)  Procedures   MEDICATIONS ORDERED IN ED: Medications  sodium chloride  0.9 % bolus 1,000 mL (has no administration in time range)  norepinephrine  (LEVOPHED ) 4mg  in (0.016 mg/mL) premix infusion (has no administration in time range)  lactated ringers  bolus 2,000 mL (has no administration in time range)  cefTRIAXone  (ROCEPHIN ) 2 g in sodium chloride  0.9 % 100 mL IVPB (has no administration in time range)  EPINEPHrine (ADRENALIN) 5 mg in NS 250 mL (0.02 mg/mL) premix infusion (0.5 mcg/min Intravenous New Bag/Given 05/25/2024 0608)  docusate sodium  (COLACE) capsule 100 mg (has no administration in time range)  polyethylene glycol (MIRALAX  / GLYCOLAX ) packet 17 g (has no administration in time range)  insulin  aspart (novoLOG ) injection 0-9 Units (has no administration in time range)  EPINEPHrine (ADRENALIN) 1 MG/10ML injection (1 mg Intravenous Given 05/28/2024 0409)  sodium bicarbonate injection (50 mEq Intravenous Given 05/25/2024 0415)  0.9 %  sodium chloride  infusion (1,000 mL/hr Intravenous New Bag/Given 06/08/2024 0427)  norepinephrine  (LEVOPHED ) injection (10 mcg/kg/min  185.2 kg (Order-Specific) Intravenous New Bag/Given 05/25/2024 0432)   CRITICAL CARE Performed by: Iver Marker   Total critical care time: 70 minutes  Critical care time was exclusive of separately billable procedures and treating other patients.  Critical care was necessary to treat or prevent imminent or life-threatening deterioration.  Critical  care was time spent personally by me on the following activities: development of treatment plan with patient and/or surrogate as well as nursing, discussions with consultants, evaluation of patient's response to treatment, examination of patient, obtaining history from patient or surrogate, ordering and performing treatments and interventions, ordering and review of laboratory studies, ordering and review of radiographic studies, pulse oximetry and re-evaluation of patient's condition.   IMPRESSION / MDM / ASSESSMENT AND PLAN / ED COURSE  I reviewed the triage vital signs and the nursing notes.                              -----------------------------------------  4:35 AM on 06/05/2024 ----------------------------------------- Patient evidently having off-and-on CPR with return of pulses about 4 times and intermittent PEA.  Initially asystolic unwitnessed.  Very poor prognosis currently no signs of spontaneous movement or respirations.  I am very concerned that the patient has an extremely poor prognosis based on his presenting arrest characteristics.  I did discuss with his wife [name] via phone, she advises that rescue is also told her very poor prognostic indicators.  She advises that she does not want the patient to undergo CPR.  She is understanding and we discussed DO NOT RESUSCITATE.  The patient's wife, identifies herself as his closest caretaker and decision maker and he does not have any other prescribed healthcare power of attorney she is aware of.  Patient is made DO NOT RESUSCITATE.  Okay to continue with medical therapy and mechanical ventilation for now, patient's wife will be coming to the hospital shortly.  Differential diagnosis includes, but is not limited to, acute respiratory failure, acute cardiac dysrhythmia, electrolyte abnormality, severe metabolic derangement, anemia, septic shock, pulmonary embolism though less likely if he is anticoagulated, etc.  Patient's presentation is  most consistent with acute presentation with potential threat to life or bodily function.   The patient is on the cardiac monitor to evaluate for evidence of arrhythmia and/or significant heart rate changes.  Clinical Course as of 05/28/2024 0616  Sun May 17, 2024  0511 Vent settings reviewed.  Given the patient's initial postarrest status, end-tidal CO2 report is 90 by EMS, I we will continue with current vent settings with respiratory rate of 22. [MQ]  0527 Labs demonstrate mild AKI, but given his history and a prior thromboembolism I have ordered CT PE and will continue imaging to the abdomen and pelvis as well.  CT head is pending.  I have paged the critical care team for consultation as the patient is obviously hemodynamically unstable postarrest requiring pressor support at this time. [MQ]  [phone] Patient's wife has arrived to the hospital.  I have updated her on current clinical status she understands the gravity of the situation.  Remains on ventilator, no CPR defibrillation but will continue with medical care including mechanical ventilation. [MQ]  0536 ICU team updated, E. Ouma - NP to eval for ongoing treatment and ICU care.  [MQ]  1914 ICU team at bedside  [MQ]    Clinical Course User Index [MQ] Iver Marker, MD   ----------------------------------------- 5:07 AM on 06/04/2024 ----------------------------------------- Nursing reports patient has frank pyuria on Foley insertion.  Additionally it is noted that he was diagnosed with urinary tract infection just a few days ago started on cephalexin .  Will start broadly on cefepime .  Prior urine culture not a typical urinary tract bacteria.  ----------------------------------------- 6:14 AM on 06/06/2024 ----------------------------------------- Patient being mated to the ICU team, E Ouma NP has met with the family and they are moving in the direction of comfort.  ICU team to continue to manage.  ICU team has requested that I cancel CT  imaging at this time given patient's wife's desires and a plan to continue with mechanical ventilation until additional family arrived to the bedside at which time the ICU team is planning to provide terminal extubation and full comfort measures according to E Ouma.  FINAL CLINICAL IMPRESSION(S) / ED DIAGNOSES   Final diagnoses:  Shock circulatory (HCC)  Cardiac arrest (HCC)     Rx / DC Orders   ED Discharge Orders     None  Note:  This document was prepared using Dragon voice recognition software and may include unintentional dictation errors.   Iver Marker, MD 06/02/2024 [phone]

## 2024-06-09 NOTE — H&P (Addendum)
 NAME:  Thomas Tanner, MRN:  [id number], DOB:  [birth year], LOS: 0 ADMISSION DATE:  05/30/2024, CONSULTATION DATE: 05/15/2024 REFERRING MD: Valetta Gaudy, CHIEF COMPLAINT: PEA cardiac arrest   HPI  73 y.o male with significant PMH of T2DM, Fournier's gangrene, necrotizing perineal infection status post cystoscopy, scrotal exploration and abrasion 05/2021, HTN, HLD, TIA, kidney stones, morbid obesity, COPD with chronic hypoxemia 4 L nasal cannula at baseline, saddle PE status post thrombectomy on anticoagulation, who presented to the ED with chief complaints of post PEA cardiac arrest.  Per ED notes, patient had unwitnessed cardiac arrest. EMS reports initially found in asystole. Last known normal was around 10 PM. They were able to resuscitate the patient with intermittent pulses and episodes of PEA for approximately 90 minutes. He did not have any shockable rhythms. He was intubated with end-tidal CO2 as high as 90 and secure airway. EMS reports positive pulse on arrival to the emergency department    ED Course: Initial vital signs showed HR of 60 beats/minute, BP 61/36 mm Hg, the RR 22 breaths/minute, and the oxygen  saturation 90% on high vent settings and a temperature of 91.44F (33.1C).  Pertinent Labs/Diagnostics Findings: Na+/ K+: 141/5.3.  Glucose: 122 BUN/Cr.:  22/1.65, anion gap 19.  AST/ALT: WBC: 22.7 Hgb/Hct: 10.4/34.5 plts:  Lactic acid: pending troponin: 46 BNP: 317.8 ABG: pO2 78; pCO2 74; pH 7.2;  HCO3 30.3, %O2 Sat 97.3.  CXR> CTH> CTA Chest> CT Abd/pelvis>pending Medication administered in the WU:JWJXBJY started on broad-spectrum antibiotics. PCCM consulted.  Past Medical History  T2DM, Fournier's gangrene, necrotizing perineal infection status post cystoscopy, scrotal exploration and abrasion 05/2021, HTN, HLD, TIA, kidney stones, morbid obesity, COPD with chronic hypoxemia 4 L nasal cannula at baseline, saddle PE status post thrombectomy   Significant Hospital Events   6/8:  Admitted to ICU status post PEA cardiac arrest  Consults:  Cardiology  Procedures:  6/8: Intubation  Significant Diagnostic Tests:  6/8: Chest Xray>  6/8: Noncontrast CT head>  6/8: CTA Chest, abdomen and pelvis>  Interim History / Subjective:      Micro Data:  6/8: Blood culture x2> 6/8: MRSA PCR>>  6/8: Strep pneumo urinary antigen> 6/8: Legionella urinary antigen>  Antimicrobials:  Ceftriaxone  Metronidazole   OBJECTIVE  There were no vitals taken for this visit.    Vent Mode: PRVC FiO2 (%):  [100 %] 100 % Set Rate:  [22 bmp] 22 bmp Vt Set:  [550 mL] 550 mL PEEP:  [12 cmH20] 12 cmH20  No intake or output data in the 24 hours ending 05/30/2024 0543 There were no vitals filed for this visit.  Physical Examination  GENERAL: 73 year-old critically ill patient lying in the bed intubated and sedated EYES: PEERLA. No scleral icterus. Extraocular muscles intact.  HEENT: Head atraumatic, normocephalic. Oropharynx and nasopharynx clear.  NECK:  No JVD, supple  LUNGS: Normal breath sounds bilaterally.  No use of accessory muscles of respiration.  CARDIOVASCULAR: S1, S2 normal. No murmurs, rubs, or gallops.  ABDOMEN: Soft, NTND EXTREMITIES: No swelling or erythema.  Capillary refill < 3 seconds in all extremities. Pulses palpable distally. NEUROLOGIC: The patient is intubated and sedated.patient does not respond confrontation bilaterally, pupils right 3 mm, left 3 mm,and non reactive, doll's response absent bilaterally. corneal reflex absent bilaterally, gag reflex absent, XI: trapezius strength unable to test bilaterally. Extremities flaccid throughout.  No spontaneous movement noted.  No purposeful movements noted. SKIN: No obvious rash, lesion, or ulcer. Warm to touch  Labs/imaging that I havepersonally reviewed  (  right click and "Reselect all SmartList Selections" daily)     Labs   CBC: Recent Labs  Lab 05/13/24 2104 05/24/2024 0417  WBC 9.3 22.7*  NEUTROABS 7.8*   --   HGB 9.5* 10.4*  HCT 31.3* 34.5*  MCV 96.6 103.9*  PLT 182 235    Basic Metabolic Panel: Recent Labs  Lab 05/13/24 2104 05/20/2024 0417  NA 143 141  K 5.1 5.3*  CL 96* 95*  CO2 41* 27  GLUCOSE 116* 122*  BUN 14 22  CREATININE 0.87 1.65*  CALCIUM  8.1* 8.4*   GFR: CrCl cannot be calculated (Unknown ideal weight.). Recent Labs  Lab 05/13/24 2104 05/26/2024 0417  WBC 9.3 22.7*    Liver Function Tests: No results for input(s): "AST", "ALT", "ALKPHOS", "BILITOT", "PROT", "ALBUMIN" in the last 168 hours. No results for input(s): "LIPASE", "AMYLASE" in the last 168 hours. No results for input(s): "AMMONIA" in the last 168 hours.  ABG    Component Value Date/Time   PHART 7.2 (L) 05/21/2024 0449   PCO2ART 74 (HH) 06/07/2024 0449   PO2ART 78 (L) 06/01/2024 0449   HCO3 30.3 (H) 05/14/2024 0449   ACIDBASEDEF 0.8 05/29/2024 0449   O2SAT 97.3 05/15/2024 0449     Coagulation Profile: No results for input(s): "INR", "PROTIME" in the last 168 hours.  Cardiac Enzymes: No results for input(s): "CKTOTAL", "CKMB", "CKMBINDEX", "TROPONINI" in the last 168 hours.  HbA1C: Hemoglobin A1C  Date/Time Value Ref Range Status  06/11/2013 04:04 AM 6.7 (H) 4.2 - 6.3 % Final    Comment:    The American Diabetes Association recommends that a primary goal of therapy should be <7% and that physicians should reevaluate the treatment regimen in patients with HbA1c values consistently >8%.    Hgb A1c MFr Bld  Date/Time Value Ref Range Status  02/26/2024 01:02 PM 5.1 4.8 - 5.6 % Final    Comment:    (NOTE) Pre diabetes:          5.7%-6.4%  Diabetes:              >6.4%  Glycemic control for   <7.0% adults with diabetes   12/16/2022 04:12 AM 5.8 (H) 4.8 - 5.6 % Final    Comment:    (NOTE)         Prediabetes: 5.7 - 6.4         Diabetes: >6.4         Glycemic control for adults with diabetes: <7.0     CBG: Recent Labs  Lab 05/14/2024 0427  GLUCAP 109*    Review of Systems:    Unable to be obtained secondary to the patient's intubated and sedated status.   Past Medical History  He,  has a past medical history of Arthritis, CHF (congestive heart failure) (HCC), COPD (chronic obstructive pulmonary disease) (HCC), Diabetes (HCC), Hyperlipidemia, Hypertension, Kidney stone, Obesity, Tachycardia, and TIA (transient ischemic attack).   Surgical History    Past Surgical History:  Procedure Laterality Date   CORONARY/GRAFT ACUTE MI REVASCULARIZATION N/A 12/08/2018   Procedure: Coronary/Graft Acute MI Revascularization;  Surgeon: Antonette Batters, MD;  Location: [facility];  Service: Cardiovascular;  Laterality: N/A;   INCISION AND DRAINAGE ABSCESS N/A 05/18/2021   Procedure: INCISION AND DRAINAGE SCROTAL ABSCESS, cystoscopy with urethral dilation, and complex catheter placement;  Surgeon: Lawerence Pressman, MD;  Location: [facility];  Service: Urology;  Laterality: N/A;   IRRIGATION AND DEBRIDEMENT ABSCESS N/A 05/20/2021   Procedure: IRRIGATION AND DEBRIDEMENT  ABSCESS;  Surgeon: Geraline Knapp, MD;  Location: [facility];  Service: Urology;  Laterality: N/A;   IRRIGATION AND DEBRIDEMENT ABSCESS N/A 05/21/2021   Procedure: IRRIGATION AND DEBRIDEMENT ABSCESS;  Surgeon: Geraline Knapp, MD;  Location: [facility];  Service: Urology;  Laterality: N/A;   KIDNEY STONE SURGERY     left arm surgery  1963   LEFT HEART CATH AND CORONARY ANGIOGRAPHY N/A 12/08/2018   Procedure: LEFT HEART CATH AND CORONARY ANGIOGRAPHY;  Surgeon: Antonette Batters, MD;  Location: [facility];  Service: Cardiovascular;  Laterality: N/A;   PULMONARY THROMBECTOMY N/A 08/03/2020   Procedure: PULMONARY THROMBECTOMY / THROMBOLYSIS;  Surgeon: Celso College, MD;  Location: [facility];  Service: Cardiovascular;  Laterality: N/A;   PULMONARY THROMBECTOMY Bilateral 01/08/2023   Procedure: PULMONARY THROMBECTOMY;  Surgeon: Jackquelyn Mass, MD;  Location: [facility];  Service:  Cardiovascular;  Laterality: Bilateral;   RECTAL EXAM UNDER ANESTHESIA N/A 05/20/2021   Procedure: EXAM UNDER ANESTHESIA-Dressing and Change;  Surgeon: Geraline Knapp, MD;  Location: [facility];  Service: Urology;  Laterality: N/A;     Social History   reports that he has been smoking cigarettes. He has been exposed to tobacco smoke. He has never used smokeless tobacco. He reports that he does not drink alcohol and does not use drugs.   Family History   His family history includes Alzheimer's disease in his mother; CAD in his father.   Allergies Allergies  Allergen Reactions   Morphine Anxiety and Other (See Comments)    Hallucinations  Pt states 6.17.22 that he is not allergic to this medication   Azithromycin  Itching    Localized pruritus on arm with IV   Morphine And Codeine Anxiety     Home Medications  Prior to Admission medications   Medication Sig Start Date End Date Taking? Authorizing Provider  albuterol  (VENTOLIN  HFA) 108 (90 Base) MCG/ACT inhaler Inhale 2 puffs into the lungs every 6 (six) hours as needed for wheezing or shortness of breath. 11/18/23   Abernathy, Alyssa, NP  ALPRAZolam  (XANAX ) 0.25 MG tablet Take 1 tablet (0.25 mg total) by mouth daily as needed (severe anxiety). May take 1 additional dose after 2 hours if the first dose is not effective. 03/26/24   Laurence Pons, NP  apixaban  (ELIQUIS ) 5 MG TABS tablet Take 1 tablet (5 mg total) by mouth 2 (two) times daily. 03/26/24   Abernathy, Alyssa, NP  cephALEXin  (KEFLEX ) 500 MG capsule Take 1 capsule (500 mg total) by mouth 3 (three) times daily. 05/13/24   Jacquie Maudlin, MD  cetirizine  (ZYRTEC ) 10 MG tablet Take 1 tablet by mouth once daily 03/09/24   Abernathy, Alyssa, NP  famotidine  (PEPCID ) 40 MG tablet Take 1 tablet (40 mg total) by mouth daily. 03/26/24   Laurence Pons, NP  fluticasone  furoate-vilanterol (BREO ELLIPTA ) 200-25 MCG/ACT AEPB Inhale 1 puff into the lungs daily. 03/01/24   Donaciano Frizzle, MD   furosemide  (LASIX ) 40 MG tablet Take 1 tablet (40 mg total) by mouth daily. 03/26/24 03/26/25  Laurence Pons, NP  hydrOXYzine  (ATARAX ) 25 MG tablet TAKE 1 TABLET BY MOUTH THREE TIMES DAILY AS NEEDED FOR ITCHING 10/10/23   Abernathy, Alyssa, NP  ipratropium-albuterol  (DUONEB) 0.5-2.5 (3) MG/3ML SOLN Take 3 mLs by nebulization every 6 (six) hours as needed. 01/07/23   Laurence Pons, NP  iron  polysaccharides (NIFEREX) 150 MG capsule Take 1 capsule (150 mg total) by mouth daily. 05/27/21   Donaciano Frizzle, MD  lansoprazole  (PREVACID ) 30 MG capsule TAKE 1 CAPSULE BY MOUTH ONCE DAILY BEFORE BREAKFAST 03/26/24   Abernathy, Alyssa, NP  metoprolol  succinate (TOPROL -XL) 50 MG 24 hr tablet Take with or immediately following a meal. 03/26/24   Abernathy, Janeice Medal, NP  nicotine  (NICODERM CQ  - DOSED IN MG/24 HOURS) 21 mg/24hr patch Place 1 patch (21 mg total) onto the skin daily. 03/01/24   Donaciano Frizzle, MD  OXYGEN  Inhale 2.5 L into the lungs. 24 HRS CONTINUOUS OXYGEN  USES [facility]    [provider]  phenylephrine -shark liver oil-mineral oil-petrolatum  (PREPARATION H) 0.25-14-74.9 % rectal ointment Place 1 application rectally 2 (two) times daily as needed for hemorrhoids. 05/16/21   Tiajuana Fluke, MD  rosuvastatin  (CRESTOR ) 20 MG tablet Take 1 tablet (20 mg total) by mouth daily. 03/26/24   Laurence Pons, NP  senna-docusate (SENOKOT-S) 8.6-50 MG tablet Take 1 tablet by mouth 2 (two) times daily. 05/16/21   Tiajuana Fluke, MD  vitamin B-12 1000 MCG tablet Take 1 tablet (1,000 mcg total) by mouth daily. 05/27/21   Donaciano Frizzle, MD  Scheduled Meds:  insulin  aspart  0-9 Units Subcutaneous Q4H   Continuous Infusions:  sodium chloride  1,000 mL/hr (06/02/2024 0427)   cefTRIAXone  (ROCEPHIN )  IV     epinephrine     lactated ringers      norepinephrine  (LEVOPHED ) Adult infusion     norepinephrine  10 mcg/kg/min (05/31/2024 0432)   sodium chloride      PRN Meds:.sodium chloride , docusate  sodium, norepinephrine , polyethylene glycol  Active Hospital Problem list   See systems below  Assessment & Plan:  PEA Cardiac Arrest with prolonged downtime>90 minutes Circulatory shock PMHx: CHF,HTN, HLD, PE s/p thrombectomy - TTM normothermia  - CT head ordered, CTA Chest, abd/pelvis - Continue Vasopressors: levophed , Epinephrine and vasopressin  PRN to maintain MAP > 70  - NMB PRN to prevent shivering - RASS goal -3, -5 if on NMB - Repeat ABG and CXR in am - Follow up EKG in am, Trend Troponin - continuous cardiac monitoring - STAT labs: CBC, BMP, Mg, Phos, Lactic, PT/ INR, troponin - replace electrolytes PRN - Strict I/O's: alert provider if UOP < 0.5 mL/kg/hr - Await re-warming for neuro prognostication however family would not wish to pursue agressive treatment at this time, they are proceeding with full comfort care once all family members have been contacted. - Cardiology consulted, appreciate input   Acute Hypoxic / Hypercapnic Respiratory Failure  PMHx: COPD, chronic hypoxemia on home oxygen , PE s/p thrombectomy - continue ventilator support & lung protective strategies - Wean PEEP & FiO2 as tolerated, maintain SpO2 > 90% - Head of bed elevated 30 degrees, VAP protocol in place - Plateau pressures less than 30 cm H20  - Intermittent chest x-ray & ABG PRN - Daily WUA with SBT as tolerated although now with poor neurological exam precluding SBT - Ensure adequate pulmonary hygiene  - F/u cultures, trend PCT - Continue CAP/Aspiration Pna coverage  - IV steroids - Budesonide  nebs BID, bronchodilators PRN - PAD protocol in place  #AKI #AGMA with possible Lactic Acidosis  Likely pre-renal azotemia in the setting of cardiac arrest, shock. Anticipate if worsens may need CRRT.  -trend bmp, mag, phos -milrinone  -replete electrolytes -strict I&O -given multiple amps of sodium bicarb -Avoid nephrotoxic agents, renally dose medications -ensure adequate renal perfusion    #Leukocytosis likely reactive in the setting of Cardiac Arrest possible sepsis -F/u cultures, trend lactic/ PCT -Monitor WBC/ fever curve -Start Zosyn  empirically pending pan scan  and cultures -Pressors for MAP goal >65 -Strict I/O's   #Toxic Metabolic Encephalopathy #At risk for Anoxic Brain Injury due to prolonged down time  -CTH once stable -EEG -Currently off sedation, neuro exam remains poor -Seizure precautions         Best practice:  Diet:  NPO Pain/Anxiety/Delirium protocol (if indicated): Yes (RASS goal -1) VAP protocol (if indicated): Yes DVT prophylaxis: Subcutaneous Heparin  GI prophylaxis: PPI Glucose control:  SSI Yes Central venous access:  N/A Arterial line:  N/A Foley:  Yes, and it is still needed Mobility:  bed rest  PT consulted: N/A Last date of multidisciplinary goals of care discussion [see IPAL note for goals of care] Code Status:  DNR Disposition: ICU   = Goals of Care = Code Status Order: DNR-COMFORT CARE  Primary Emergency Contact: Williams,[name], Home Phone: [phone]   Critical care time: 45 minutes        Alonza Arthurs DNP, CCRN, FNP-C, AGACNP-BC Acute Care & Family Nurse Practitioner [facility] PCCM on call pager [phone]

## 2024-06-09 NOTE — Progress Notes (Signed)
 Patient extubated by respiratory therapy as ordered by provider, family at bedside.

## 2024-06-09 NOTE — IPAL (Signed)
 Interdisciplinary Goals of Care Family Meeting     Date carried out: 05/12/2024 Location of the meeting: Bedside   Member's involved: NP and Family Member or next of kin   Durable Power of Attorney or Environmental health practitioner:    Discussion:  Advance Care Planning/Goals of Care discussion was performed during the course of treatment to decide on type of care right for this patient following admission to the ICU.   I met with patient's wife and son at the bedside to discuss goals of care in details. I Reviewed patient's clinical status, neurological exam, labs, vital signs including hemodynamics requiring multiple pressors  and overall poor prognosis with the family at the bedside and answered all their question.   Discussed prognosis, expected outcome with or without ongoing aggressive treatments and the options for de-escalation of care.   Diagnosis(es):PEA Cardiac Arrest of unknown Etiology and Acute hypoxic hypercapnic respiratory failure secondary  Prognosis: Poor Code Status: DNR Disposition: ICU Next Steps:  Family understands the situation. They have consented and agreed to DNR/DNI and would not wish to pursue any aggressive treatment or additional test/imaging.  Patient's family would like to proceed with full comfort care including terminal extubation.    Family are satisfied with Plan of action and management. All questions answered     Total Time Spent Face to Face addressing advance care planning in the presence of the Patient: 35 minutes       Alonza Arthurs, DNP, CCRN, FNP-C, AGACNP-BC Acute Care & Family Nurse Practitioner  [facility]  See Amion for personal pager PCCM on call pager [phone] until 7 am

## 2024-06-09 NOTE — Progress Notes (Signed)
 Patient's wife notified this nurse that she is ready for patient to transition to comfort care.  Provider notified.

## 2024-06-09 NOTE — Death Summary Note (Addendum)
 DEATH SUMMARY   Patient Details  Name: Thomas Tanner MRN: [id number] DOB: July 16, 1951  Admission/Discharge Information   Admit Date:  Jun 06, 2024  Date of Death:  06-Jun-2024   Time of Death:  June 14, 1407  Length of Stay: 0  Referring Physician: Laurence Pons, NP   Reason(s) for Hospitalization  CARDIAC ARREST  Diagnoses  Preliminary cause of death: ISCHEMIC CARDIOMYOPATHY, ANOXIC BRIAN DAMAGE Secondary Diagnoses (including complications and co-morbidities):  Principal Problem:   Cardiac arrest with successful resuscitation [facility])   Brief Hospital Course (including significant findings, care, treatment, and services provided and events leading to death)  73 y.o male with significant PMH of T2DM, Fournier's gangrene, necrotizing perineal infection status post cystoscopy, scrotal exploration and abrasion 06-13-21, HTN, HLD, TIA, kidney stones, morbid obesity, COPD with chronic hypoxemia 4 L nasal cannula at baseline, saddle PE status post thrombectomy on anticoagulation, who presented to the ED with chief complaints of post PEA cardiac arrest.   Per ED notes, patient had unwitnessed cardiac arrest. EMS reports initially found in asystole. Last known normal was around 10 PM. They were able to resuscitate the patient with intermittent pulses and episodes of PEA for approximately 90 minutes. He did not have any shockable rhythms. He was intubated with end-tidal CO2 as high as 90 and secure airway.    Discussion:  Advance Care Planning/Goals of Care discussion was performed during the course of treatment to decide on type of care right for this patient following admission to the ICU.   I met with patient's wife and son at the bedside to discuss goals of care in details. I Reviewed patient's clinical status, neurological exam, labs, vital signs including hemodynamics requiring multiple pressors  and overall poor prognosis with the family at the bedside and answered all their question.    Discussed prognosis, expected outcome with or without ongoing aggressive treatments and the options for de-escalation of care.   Diagnosis(es):PEA Cardiac Arrest of unknown Etiology and Acute hypoxic hypercapnic respiratory failure secondary  Prognosis: Poor Code Status: DNR Disposition: ICU Next Steps:  Family understands the situation. They have consented and agreed to DNR/DNI and would not wish to pursue any aggressive treatment or additional test/imaging.  Patient's family would like to proceed with full comfort care including terminal extubation.    Family are satisfied with Plan of action and management. All questions answeredmergency department     Pertinent Labs and Studies  Significant Diagnostic Studies DG Chest Port 1 View Result Date: Jun 06, 2024 CLINICAL DATA:  Status post intubation.  Evaluate ET tube placement. EXAM: PORTABLE CHEST 1 VIEW COMPARISON:  05/13/2024 FINDINGS: The endotracheal tube tip is 6.7 cm above the carina. Enteric tube tip and side port are just below the level of the GE junction within the proximal stomach. Lung volumes are low and there is asymmetric elevation of the right hemidiaphragm. Stable cardiomediastinal contours. There is mild diffuse increase interstitial markings with opacities identified within the right mid, right lower and left lower lung. Increase soft tissue prominence along the left hilar region is identified which may reflect sequelae of rotational artifact. As this was not noted on the exam from 05/13/2024. Bones appear diffusely osteopenic. No acute osseous abnormality identified. IMPRESSION: 1. Endotracheal tube tip is 6.7 cm above the carina. 2. Enteric tube tip and side port are just below the level of the GE junction within the proximal stomach. Consider further advancement of the enteric tube. 3. Low lung volumes with asymmetric elevation of the right hemidiaphragm. 4. Increased interstitial markings  with opacities within the right mid,  right lower and left lower lung. Findings may reflect atelectasis or pneumonia. 5. Increase soft tissue prominence along the left hilar region is identified which may reflect sequelae of rotational artifact. As this was not noted on the exam from 05/13/2024, consider further evaluation with CT of the chest to exclude underlying hilar mass. Electronically Signed   By: Kimberley Penman M.D.   On: 06/02/2024 05:43   DG Chest Portable 1 View Result Date: 05/13/2024 CLINICAL DATA:  Weakness, dyspnea EXAM: PORTABLE CHEST 1 VIEW COMPARISON:  02/24/2024 FINDINGS: Underpenetration. Lungs are symmetrically well expanded. Mild right basilar atelectasis. Stable cardiomegaly. Pulmonary vascularity is normal. No pneumothorax. No pleural effusion. No acute bone abnormality IMPRESSION: 1. Mild right basilar atelectasis. 2. Stable cardiomegaly. Electronically Signed   By: Worthy Heads M.D.   On: 05/13/2024 21:39    Microbiology Recent Results (from the past 240 hours)  Resp panel by RT-PCR (RSV, Flu A&B, Covid) Urine, Clean Catch     Status: None   Collection Time: 05/13/24  9:04 PM   Specimen: Urine, Clean Catch; Nasal Swab  Result Value Ref Range Status   SARS Coronavirus 2 by RT PCR NEGATIVE NEGATIVE Final    Comment: (NOTE) SARS-CoV-2 target nucleic acids are NOT DETECTED.  The SARS-CoV-2 RNA is generally detectable in upper respiratory specimens during the acute phase of infection. The lowest concentration of SARS-CoV-2 viral copies this assay can detect is 138 copies/mL. A negative result does not preclude SARS-Cov-2 infection and should not be used as the sole basis for treatment or other patient management decisions. A negative result may occur with  improper specimen collection/handling, submission of specimen other than nasopharyngeal swab, presence of viral mutation(s) within the areas targeted by this assay, and inadequate number of viral copies(<138 copies/mL). A negative result must be combined  with clinical observations, patient history, and epidemiological information. The expected result is Negative.  Fact Sheet for Patients:  BloggerCourse.com  Fact Sheet for Healthcare Providers:  SeriousBroker.it  This test is no t yet approved or cleared by the [place]  FDA and  has been authorized for detection and/or diagnosis of SARS-CoV-2 by FDA under an Emergency Use Authorization (EUA). This EUA will remain  in effect (meaning this test can be used) for the duration of the COVID-19 declaration under Section 564(b)(1) of the Act, 21 U.S.C.section 360bbb-3(b)(1), unless the authorization is terminated  or revoked sooner.       Influenza A by PCR NEGATIVE NEGATIVE Final   Influenza B by PCR NEGATIVE NEGATIVE Final    Comment: (NOTE) The Xpert Xpress SARS-CoV-2/FLU/RSV plus assay is intended as an aid in the diagnosis of influenza from Nasopharyngeal swab specimens and should not be used as a sole basis for treatment. Nasal washings and aspirates are unacceptable for Xpert Xpress SARS-CoV-2/FLU/RSV testing.  Fact Sheet for Patients: BloggerCourse.com  Fact Sheet for Healthcare Providers: SeriousBroker.it  This test is not yet approved or cleared by the [place]  FDA and has been authorized for detection and/or diagnosis of SARS-CoV-2 by FDA under an Emergency Use Authorization (EUA). This EUA will remain in effect (meaning this test can be used) for the duration of the COVID-19 declaration under Section 564(b)(1) of the Act, 21 U.S.C. section 360bbb-3(b)(1), unless the authorization is terminated or revoked.     Resp Syncytial Virus by PCR NEGATIVE NEGATIVE Final    Comment: (NOTE) Fact Sheet for Patients: BloggerCourse.com  Fact Sheet for Healthcare Providers: SeriousBroker.it  This test  is not yet approved  or cleared by the [place]  FDA and has been authorized for detection and/or diagnosis of SARS-CoV-2 by FDA under an Emergency Use Authorization (EUA). This EUA will remain in effect (meaning this test can be used) for the duration of the COVID-19 declaration under Section 564(b)(1) of the Act, 21 U.S.C. section 360bbb-3(b)(1), unless the authorization is terminated or revoked.  Performed at [facility], [address] [place], [place] [address]   Urine Culture     Status: Abnormal   Collection Time: 05/13/24  9:04 PM   Specimen: Urine, Random  Result Value Ref Range Status   Specimen Description   Final    URINE, RANDOM Performed at [facility], [address] [place], [place] [address]    Special Requests   Final    NONE Reflexed from [id number] Performed at [facility], [address] [place], [place] [address]    Culture (A)  Final    >=100,000 COLONIES/mL AEROCOCCUS SANGUINICOLA Standardized susceptibility testing for this organism is not available. Performed at [facility], [address], [place], [place] [address]    Report Status 05/16/2024 FINAL  Final  Resp panel by RT-PCR (RSV, Flu A&B, Covid) Anterior Nasal Swab     Status: None   Collection Time: 05/23/2024  4:17 AM   Specimen: Anterior Nasal Swab  Result Value Ref Range Status   SARS Coronavirus 2 by RT PCR NEGATIVE NEGATIVE Final    Comment: (NOTE) SARS-CoV-2 target nucleic acids are NOT DETECTED.  The SARS-CoV-2 RNA is generally detectable in upper respiratory specimens during the acute phase of infection. The lowest concentration of SARS-CoV-2 viral copies this assay can detect is 138 copies/mL. A negative result does not preclude SARS-Cov-2 infection and should not be used as the sole basis for treatment or other patient management decisions. A negative result may occur with  improper specimen collection/handling, submission of specimen other than nasopharyngeal  swab, presence of viral mutation(s) within the areas targeted by this assay, and inadequate number of viral copies(<138 copies/mL). A negative result must be combined with clinical observations, patient history, and epidemiological information. The expected result is Negative.  Fact Sheet for Patients:  BloggerCourse.com  Fact Sheet for Healthcare Providers:  SeriousBroker.it  This test is no t yet approved or cleared by the [place]  FDA and  has been authorized for detection and/or diagnosis of SARS-CoV-2 by FDA under an Emergency Use Authorization (EUA). This EUA will remain  in effect (meaning this test can be used) for the duration of the COVID-19 declaration under Section 564(b)(1) of the Act, 21 U.S.C.section 360bbb-3(b)(1), unless the authorization is terminated  or revoked sooner.       Influenza A by PCR NEGATIVE NEGATIVE Final   Influenza B by PCR NEGATIVE NEGATIVE Final    Comment: (NOTE) The Xpert Xpress SARS-CoV-2/FLU/RSV plus assay is intended as an aid in the diagnosis of influenza from Nasopharyngeal swab specimens and should not be used as a sole basis for treatment. Nasal washings and aspirates are unacceptable for Xpert Xpress SARS-CoV-2/FLU/RSV testing.  Fact Sheet for Patients: BloggerCourse.com  Fact Sheet for Healthcare Providers: SeriousBroker.it  This test is not yet approved or cleared by the [place]  FDA and has been authorized for detection and/or diagnosis of SARS-CoV-2 by FDA under an Emergency Use Authorization (EUA). This EUA will remain in effect (meaning this test can be used) for the duration of the COVID-19 declaration under Section 564(b)(1) of the  Act, 21 U.S.C. section 360bbb-3(b)(1), unless the authorization is terminated or revoked.     Resp Syncytial Virus by PCR NEGATIVE NEGATIVE Final    Comment: (NOTE) Fact Sheet for  Patients: BloggerCourse.com  Fact Sheet for Healthcare Providers: SeriousBroker.it  This test is not yet approved or cleared by the [place]  FDA and has been authorized for detection and/or diagnosis of SARS-CoV-2 by FDA under an Emergency Use Authorization (EUA). This EUA will remain in effect (meaning this test can be used) for the duration of the COVID-19 declaration under Section 564(b)(1) of the Act, 21 U.S.C. section 360bbb-3(b)(1), unless the authorization is terminated or revoked.  Performed at [facility], [address] [place], [place] [address]   MRSA Next Gen by PCR, Nasal     Status: None   Collection Time: 06/02/2024  7:54 AM   Specimen: Nasal Mucosa; Nasal Swab  Result Value Ref Range Status   MRSA by PCR Next Gen NOT DETECTED NOT DETECTED Final    Comment: (NOTE) The GeneXpert MRSA Assay (FDA approved for NASAL specimens only), is one component of a comprehensive MRSA colonization surveillance program. It is not intended to diagnose MRSA infection nor to guide or monitor treatment for MRSA infections. Test performance is not FDA approved in patients less than 30 years old. Performed at [facility], [address] [place], [place] [address]     Lab Basic Metabolic Panel: Recent Labs  Lab 05/13/24 2104 05/24/2024 0417  NA 143 141  K 5.1 5.3*  CL 96* 95*  CO2 41* 27  GLUCOSE 116* 122*  BUN 14 22  CREATININE 0.87 1.65*  CALCIUM  8.1* 8.4*   Liver Function Tests: No results for input(s): "AST", "ALT", "ALKPHOS", "BILITOT", "PROT", "ALBUMIN" in the last 168 hours. No results for input(s): "LIPASE", "AMYLASE" in the last 168 hours. No results for input(s): "AMMONIA" in the last 168 hours. CBC: Recent Labs  Lab 05/13/24 2104 06/07/2024 0417  WBC 9.3 22.7*  NEUTROABS 7.8*  --   HGB 9.5* 10.4*  HCT 31.3* 34.5*  MCV 96.6 103.9*  PLT 182 235   Cardiac Enzymes: No results for  input(s): "CKTOTAL", "CKMB", "CKMBINDEX", "TROPONINI" in the last 168 hours. Sepsis Labs: Recent Labs  Lab 05/13/24 2104 06/04/2024 0417  WBC 9.3 22.7*  LATICACIDVEN  --  6.3*     Micky Sheller Nestora Baptise, M.D.  [facility]  Medical Director [facility] Medical Director [facility] Cardio-Pulmonary Department

## 2024-06-09 NOTE — ED Notes (Signed)
 MD notified of lactic result.

## 2024-06-09 DEATH — deceased
